# Patient Record
Sex: Male | Born: 1935 | Race: Black or African American | Hispanic: No | Marital: Married | Smoking: Former smoker
Health system: Southern US, Community
[De-identification: ages and names within clinical notes are randomized; demographics above are authoritative.]

## PROBLEM LIST (undated history)

## (undated) DIAGNOSIS — I255 Ischemic cardiomyopathy: Secondary | ICD-10-CM

## (undated) DIAGNOSIS — I480 Paroxysmal atrial fibrillation: Secondary | ICD-10-CM

## (undated) DIAGNOSIS — I2510 Atherosclerotic heart disease of native coronary artery without angina pectoris: Secondary | ICD-10-CM

## (undated) DIAGNOSIS — E119 Type 2 diabetes mellitus without complications: Secondary | ICD-10-CM

## (undated) DIAGNOSIS — I5020 Unspecified systolic (congestive) heart failure: Secondary | ICD-10-CM

## (undated) DIAGNOSIS — I10 Essential (primary) hypertension: Secondary | ICD-10-CM

## (undated) DIAGNOSIS — I442 Atrioventricular block, complete: Secondary | ICD-10-CM

## (undated) DIAGNOSIS — N183 Chronic kidney disease, stage 3 unspecified: Secondary | ICD-10-CM

## (undated) DIAGNOSIS — I453 Trifascicular block: Secondary | ICD-10-CM

## (undated) DIAGNOSIS — E785 Hyperlipidemia, unspecified: Secondary | ICD-10-CM

## (undated) DIAGNOSIS — I441 Atrioventricular block, second degree: Secondary | ICD-10-CM

## (undated) DIAGNOSIS — I509 Heart failure, unspecified: Secondary | ICD-10-CM

## (undated) HISTORY — DX: Unspecified systolic (congestive) heart failure: I50.20

## (undated) HISTORY — PX: CARDIAC CATHETERIZATION: SHX172

## (undated) HISTORY — DX: Atherosclerotic heart disease of native coronary artery without angina pectoris: I25.10

## (undated) HISTORY — DX: Atrioventricular block, second degree: I44.1

## (undated) HISTORY — PX: HERNIA REPAIR: SHX51

## (undated) HISTORY — DX: Trifascicular block: I45.3

## (undated) HISTORY — DX: Atrioventricular block, complete: I44.2

## (undated) HISTORY — DX: Hyperlipidemia, unspecified: E78.5

## (undated) HISTORY — DX: Ischemic cardiomyopathy: I25.5

## (undated) HISTORY — DX: Heart failure, unspecified: I50.9

## (undated) HISTORY — DX: Chronic kidney disease, stage 3 unspecified: N18.30

## (undated) HISTORY — DX: Paroxysmal atrial fibrillation: I48.0

## (undated) HISTORY — PX: BACK SURGERY: SHX140

---

## 1989-08-24 DIAGNOSIS — I10 Essential (primary) hypertension: Secondary | ICD-10-CM | POA: Insufficient documentation

## 2005-05-14 ENCOUNTER — Emergency Department: Payer: Self-pay | Admitting: Unknown Physician Specialty

## 2005-10-05 DIAGNOSIS — Z8546 Personal history of malignant neoplasm of prostate: Secondary | ICD-10-CM

## 2005-10-05 DIAGNOSIS — C61 Malignant neoplasm of prostate: Secondary | ICD-10-CM | POA: Insufficient documentation

## 2005-10-05 HISTORY — DX: Malignant neoplasm of prostate: C61

## 2006-03-09 ENCOUNTER — Emergency Department: Payer: Self-pay | Admitting: Emergency Medicine

## 2006-05-02 ENCOUNTER — Emergency Department: Payer: Self-pay | Admitting: Emergency Medicine

## 2006-05-02 ENCOUNTER — Other Ambulatory Visit: Payer: Self-pay

## 2007-06-30 ENCOUNTER — Inpatient Hospital Stay: Payer: Self-pay | Admitting: Internal Medicine

## 2007-06-30 ENCOUNTER — Other Ambulatory Visit: Payer: Self-pay

## 2007-07-01 ENCOUNTER — Other Ambulatory Visit: Payer: Self-pay

## 2007-07-25 ENCOUNTER — Ambulatory Visit: Payer: Self-pay | Admitting: Vascular Surgery

## 2007-07-26 ENCOUNTER — Ambulatory Visit: Payer: Self-pay | Admitting: Vascular Surgery

## 2008-05-12 ENCOUNTER — Emergency Department: Payer: Self-pay | Admitting: Emergency Medicine

## 2008-10-08 DIAGNOSIS — Z9861 Coronary angioplasty status: Secondary | ICD-10-CM

## 2008-10-08 HISTORY — DX: Coronary angioplasty status: Z98.61

## 2008-11-01 ENCOUNTER — Inpatient Hospital Stay: Payer: Self-pay | Admitting: Internal Medicine

## 2008-12-03 DIAGNOSIS — I209 Angina pectoris, unspecified: Secondary | ICD-10-CM | POA: Insufficient documentation

## 2008-12-03 DIAGNOSIS — I200 Unstable angina: Secondary | ICD-10-CM | POA: Insufficient documentation

## 2009-02-04 ENCOUNTER — Encounter: Payer: Self-pay | Admitting: Internal Medicine

## 2009-02-06 ENCOUNTER — Encounter: Payer: Self-pay | Admitting: Internal Medicine

## 2009-03-08 ENCOUNTER — Encounter: Payer: Self-pay | Admitting: Internal Medicine

## 2009-04-08 ENCOUNTER — Encounter: Payer: Self-pay | Admitting: Internal Medicine

## 2010-05-09 ENCOUNTER — Inpatient Hospital Stay: Payer: Self-pay | Admitting: Internal Medicine

## 2010-08-20 ENCOUNTER — Inpatient Hospital Stay: Payer: Self-pay | Admitting: Internal Medicine

## 2010-09-09 ENCOUNTER — Ambulatory Visit: Payer: Self-pay | Admitting: Cardiology

## 2011-05-25 DIAGNOSIS — K5730 Diverticulosis of large intestine without perforation or abscess without bleeding: Secondary | ICD-10-CM | POA: Insufficient documentation

## 2012-02-23 ENCOUNTER — Ambulatory Visit: Payer: Self-pay | Admitting: Anesthesiology

## 2012-05-23 ENCOUNTER — Ambulatory Visit: Payer: Self-pay | Admitting: Ophthalmology

## 2012-06-21 ENCOUNTER — Inpatient Hospital Stay: Payer: Self-pay | Admitting: Internal Medicine

## 2012-06-21 LAB — CBC
MCH: 29.2 pg (ref 26.0–34.0)
MCHC: 34.1 g/dL (ref 32.0–36.0)
Platelet: 174 10*3/uL (ref 150–440)
RDW: 14.5 % (ref 11.5–14.5)

## 2012-06-21 LAB — COMPREHENSIVE METABOLIC PANEL
Albumin: 3.1 g/dL — ABNORMAL LOW (ref 3.4–5.0)
Alkaline Phosphatase: 104 U/L (ref 50–136)
BUN: 10 mg/dL (ref 7–18)
Bilirubin,Total: 0.5 mg/dL (ref 0.2–1.0)
Calcium, Total: 7.2 mg/dL — ABNORMAL LOW (ref 8.5–10.1)
EGFR (Non-African Amer.): >60
Glucose: 287 mg/dL — ABNORMAL HIGH (ref 65–99)
Potassium: 2.8 mmol/L — ABNORMAL LOW (ref 3.5–5.1)
SGOT(AST): 18 U/L (ref 15–37)
Sodium: 141 mmol/L (ref 136–145)
Total Protein: 6.9 g/dL (ref 6.4–8.2)

## 2012-06-21 LAB — TROPONIN I: Troponin-I: 0.11 ng/mL — ABNORMAL HIGH

## 2012-06-22 LAB — CK TOTAL AND CKMB (NOT AT ARMC)
CK, Total: 220 U/L (ref 35–232)
CK, Total: 226 U/L (ref 35–232)
CK-MB: 0.8 ng/mL (ref 0.5–3.6)

## 2012-06-22 LAB — BASIC METABOLIC PANEL
BUN: 9 mg/dL (ref 7–18)
Calcium, Total: 7.2 mg/dL — ABNORMAL LOW (ref 8.5–10.1)
Creatinine: 0.92 mg/dL (ref 0.60–1.30)
EGFR (African American): >60
EGFR (Non-African Amer.): >60
Glucose: 196 mg/dL — ABNORMAL HIGH (ref 65–99)
Sodium: 143 mmol/L (ref 136–145)

## 2012-06-22 LAB — CBC WITH DIFFERENTIAL/PLATELET
Basophil #: 0 10*3/uL (ref 0.0–0.1)
Eosinophil %: 7.5 %
HCT: 35.7 % — ABNORMAL LOW (ref 40.0–52.0)
Lymphocyte #: 1.5 10*3/uL (ref 1.0–3.6)
MCH: 28.8 pg (ref 26.0–34.0)
MCV: 85 fL (ref 80–100)
Monocyte #: 0.6 x10 3/mm (ref 0.2–1.0)
Neutrophil %: 53.1 %
Platelet: 175 10*3/uL (ref 150–440)
RDW: 14.1 % (ref 11.5–14.5)

## 2012-06-22 LAB — HEMOGLOBIN A1C: Hemoglobin A1C: 10.6 % — ABNORMAL HIGH (ref 4.2–6.3)

## 2012-06-22 LAB — LIPID PANEL
Cholesterol: 113 mg/dL (ref 0–200)
HDL Cholesterol: 38 mg/dL — ABNORMAL LOW (ref 40–60)
Triglycerides: 71 mg/dL (ref 0–200)

## 2012-06-22 LAB — TROPONIN I
Troponin-I: 0.11 ng/mL — ABNORMAL HIGH
Troponin-I: 0.12 ng/mL — ABNORMAL HIGH

## 2012-06-23 LAB — BASIC METABOLIC PANEL
BUN: 9 mg/dL (ref 7–18)
Calcium, Total: 7.2 mg/dL — ABNORMAL LOW (ref 8.5–10.1)
Chloride: 106 mmol/L (ref 98–107)
EGFR (African American): >60
Osmolality: 289 (ref 275–301)
Potassium: 2.8 mmol/L — ABNORMAL LOW (ref 3.5–5.1)
Sodium: 144 mmol/L (ref 136–145)

## 2012-06-24 LAB — BASIC METABOLIC PANEL
Calcium, Total: 7.3 mg/dL — ABNORMAL LOW (ref 8.5–10.1)
Creatinine: 0.87 mg/dL (ref 0.60–1.30)
EGFR (African American): >60
EGFR (Non-African Amer.): >60
Sodium: 141 mmol/L (ref 136–145)

## 2012-06-27 LAB — CULTURE, BLOOD (SINGLE)

## 2012-08-09 ENCOUNTER — Emergency Department: Payer: Self-pay | Admitting: Emergency Medicine

## 2012-08-10 LAB — CBC WITH DIFFERENTIAL/PLATELET
Basophil %: 1.2 %
Eosinophil #: 0.4 10*3/uL (ref 0.0–0.7)
Eosinophil %: 7.1 %
HCT: 38.6 % — ABNORMAL LOW (ref 40.0–52.0)
HGB: 13.4 g/dL (ref 13.0–18.0)
Lymphocyte #: 1.5 10*3/uL (ref 1.0–3.6)
Lymphocyte %: 27.7 %
MCHC: 34.6 g/dL (ref 32.0–36.0)
Monocyte #: 0.4 x10 3/mm (ref 0.2–1.0)
Monocyte %: 7.1 %
Neutrophil #: 3.1 10*3/uL (ref 1.4–6.5)
Neutrophil %: 56.9 %
Platelet: 182 10*3/uL (ref 150–440)
RBC: 4.49 10*6/uL (ref 4.40–5.90)

## 2012-08-10 LAB — BASIC METABOLIC PANEL
Anion Gap: 10 (ref 7–16)
BUN: 15 mg/dL (ref 7–18)
Calcium, Total: 8.4 mg/dL — ABNORMAL LOW (ref 8.5–10.1)
Chloride: 107 mmol/L (ref 98–107)
Creatinine: 0.99 mg/dL (ref 0.60–1.30)
EGFR (African American): >60
EGFR (Non-African Amer.): >60
Glucose: 187 mg/dL — ABNORMAL HIGH (ref 65–99)
Osmolality: 294 (ref 275–301)
Potassium: 3.1 mmol/L — ABNORMAL LOW (ref 3.5–5.1)

## 2012-08-10 LAB — SEDIMENTATION RATE: Erythrocyte Sed Rate: 11 mm/hr (ref 0–20)

## 2012-09-05 ENCOUNTER — Ambulatory Visit: Payer: Self-pay

## 2013-05-21 ENCOUNTER — Inpatient Hospital Stay: Payer: Self-pay | Admitting: Internal Medicine

## 2013-05-21 LAB — TROPONIN I: Troponin-I: 0.12 ng/mL — ABNORMAL HIGH

## 2013-05-21 LAB — CK TOTAL AND CKMB (NOT AT ARMC)
CK, Total: 110 U/L (ref 35–232)
CK-MB: 0.7 ng/mL (ref 0.5–3.6)

## 2013-05-21 LAB — CBC
HGB: 13.2 g/dL (ref 13.0–18.0)
MCH: 28.5 pg (ref 26.0–34.0)
RBC: 4.62 10*6/uL (ref 4.40–5.90)
RDW: 14.5 % (ref 11.5–14.5)
WBC: 5.2 10*3/uL (ref 3.8–10.6)

## 2013-05-21 LAB — BASIC METABOLIC PANEL
Anion Gap: 6 — ABNORMAL LOW (ref 7–16)
BUN: 16 mg/dL (ref 7–18)
Calcium, Total: 7.3 mg/dL — ABNORMAL LOW (ref 8.5–10.1)
Chloride: 109 mmol/L — ABNORMAL HIGH (ref 98–107)
Co2: 28 mmol/L (ref 21–32)
Creatinine: 1.16 mg/dL (ref 0.60–1.30)
EGFR (African American): >60
EGFR (Non-African Amer.): >60
Osmolality: 290 (ref 275–301)
Sodium: 143 mmol/L (ref 136–145)

## 2013-05-21 LAB — HEMOGLOBIN A1C: Hemoglobin A1C: 8.5 % — ABNORMAL HIGH (ref 4.2–6.3)

## 2013-05-21 LAB — MAGNESIUM: Magnesium: 0.8 mg/dL — ABNORMAL LOW

## 2013-05-21 LAB — PROTIME-INR: INR: 0.9

## 2013-05-22 LAB — LIPID PANEL
HDL Cholesterol: 50 mg/dL (ref 40–60)
Ldl Cholesterol, Calc: 69 mg/dL (ref 0–100)
Triglycerides: 68 mg/dL (ref 0–200)
VLDL Cholesterol, Calc: 14 mg/dL (ref 5–40)

## 2013-05-22 LAB — MAGNESIUM: Magnesium: 2.1 mg/dL

## 2013-05-22 LAB — TROPONIN I
Troponin-I: 0.12 ng/mL — ABNORMAL HIGH
Troponin-I: 0.1 ng/mL — ABNORMAL HIGH

## 2013-05-22 LAB — CK TOTAL AND CKMB (NOT AT ARMC)
CK, Total: 86 U/L (ref 35–232)
CK-MB: 0.8 ng/mL (ref 0.5–3.6)
CK-MB: 0.8 ng/mL (ref 0.5–3.6)

## 2013-06-05 DIAGNOSIS — R001 Bradycardia, unspecified: Secondary | ICD-10-CM | POA: Diagnosis present

## 2014-01-06 ENCOUNTER — Ambulatory Visit (HOSPITAL_COMMUNITY)
Admission: AD | Admit: 2014-01-06 | Discharge: 2014-01-06 | Disposition: A | Discharge status: Home / Self Care | Payer: Self-pay | Source: Other Acute Inpatient Hospital | Attending: Emergency Medicine | Admitting: Emergency Medicine

## 2014-01-06 ENCOUNTER — Emergency Department: Payer: Self-pay | Admitting: Emergency Medicine

## 2014-01-06 DIAGNOSIS — R079 Chest pain, unspecified: Secondary | ICD-10-CM | POA: Insufficient documentation

## 2014-01-06 LAB — TROPONIN I
Troponin-I: 0.17 ng/mL — ABNORMAL HIGH
Troponin-I: 0.14 ng/mL — ABNORMAL HIGH

## 2014-01-06 LAB — COMPREHENSIVE METABOLIC PANEL
ALBUMIN: 3.5 g/dL (ref 3.4–5.0)
ALT: 23 U/L (ref 12–78)
Alkaline Phosphatase: 87 U/L
Anion Gap: 4 — ABNORMAL LOW (ref 7–16)
BILIRUBIN TOTAL: 0.3 mg/dL (ref 0.2–1.0)
BUN: 21 mg/dL — ABNORMAL HIGH (ref 7–18)
CALCIUM: 7.7 mg/dL — AB (ref 8.5–10.1)
CO2: 32 mmol/L (ref 21–32)
Chloride: 106 mmol/L (ref 98–107)
Creatinine: 1.02 mg/dL (ref 0.60–1.30)
EGFR (Non-African Amer.): >60
Glucose: 120 mg/dL — ABNORMAL HIGH (ref 65–99)
Osmolality: 287 (ref 275–301)
POTASSIUM: 3 mmol/L — AB (ref 3.5–5.1)
SGOT(AST): 24 U/L (ref 15–37)
SODIUM: 142 mmol/L (ref 136–145)
Total Protein: 6.9 g/dL (ref 6.4–8.2)

## 2014-01-06 LAB — MAGNESIUM: Magnesium: 0.9 mg/dL — ABNORMAL LOW

## 2014-01-06 LAB — CBC
HCT: 39.8 % — ABNORMAL LOW (ref 40.0–52.0)
HGB: 12.6 g/dL — ABNORMAL LOW (ref 13.0–18.0)
MCH: 27.1 pg (ref 26.0–34.0)
MCHC: 31.7 g/dL — ABNORMAL LOW (ref 32.0–36.0)
MCV: 86 fL (ref 80–100)
Platelet: 198 10*3/uL (ref 150–440)
RBC: 4.66 10*6/uL (ref 4.40–5.90)
RDW: 14.9 % — ABNORMAL HIGH (ref 11.5–14.5)
WBC: 5.9 10*3/uL (ref 3.8–10.6)

## 2014-01-06 LAB — CK TOTAL AND CKMB (NOT AT ARMC)
CK, TOTAL: 145 U/L
CK-MB: 1.5 ng/mL (ref 0.5–3.6)

## 2014-01-06 LAB — APTT: Activated PTT: 27 secs (ref 23.6–35.9)

## 2014-01-06 LAB — PROTIME-INR
INR: 1
Prothrombin Time: 12.6 secs (ref 11.5–14.7)

## 2014-01-09 MED FILL — Aspirin Chew Tab 81 MG: ORAL | Qty: 3 | Status: AC

## 2014-01-24 ENCOUNTER — Encounter: Payer: Self-pay | Admitting: Cardiovascular Disease

## 2014-02-06 ENCOUNTER — Encounter: Payer: Self-pay | Admitting: Cardiovascular Disease

## 2014-03-08 ENCOUNTER — Encounter: Payer: Self-pay | Admitting: Cardiovascular Disease

## 2014-04-08 ENCOUNTER — Encounter: Payer: Self-pay | Admitting: Cardiovascular Disease

## 2015-02-25 NOTE — H&P (Signed)
PATIENT NAME:  [name], [name] MR#:  [id number] DATE OF BIRTH:  February 03, 1935  DATE OF ADMISSION:  06/21/2012  REFERRING PHYSICIAN: Dr. Robet Leu    CHIEF COMPLAINT: Chest pain.   PRIMARY CARE PHYSICIAN: At [place]: At [place]: The patient is a pleasant 80 year old African American male with history of coronary artery disease status post stenting, history of GI bleed secondary to PUD, diabetes, and hypertension who presents with above chief complaint. The patient stated that he started to have wheezing and shortness of breath two days ago with a productive cough with yellowish sputum. He has no history of wheezing in the past. Then last night he started to have chest pain in the left side of the chest without radiation or visual changes. The patient has some shortness of breath with the chest pain. The pain is exacerbated with coughing. There is no nausea, vomiting, or abdominal pain. The pain in the chest is described as pressure coming and going. Here he was found to have elevated troponin of 0.1. The patient has no fever, no leukocytosis. Hospitalist service was contacted for further evaluation and management.   PAST MEDICAL HISTORY:  1. Coronary artery disease, status post stenting x4. 2. History of GI bleed secondary to PUD. 3. Hypertension. 4. Diabetes.  5. Hyperlipidemia.  6. Prostate cancer status post external beam radiation. 7. History of back surgeries. 8. History of hernia repair. 9. History of bilateral eyelid surgery for eyelid droopiness.   MEDICATIONS:  1. Amlodipine 10 mg daily.  2. Aspirin 81 mg daily.  3. Lipitor 80 mg daily.  4. Desonide 0.05% topical cream apply to sutures two times a day.  5. Lantus 36 units daily.  6. Losartan 50 mg 2 times a day.  7. Metformin 1000 mg 2 times a day.  8. Metoprolol tartrate 25 mg 2 times a day.  9. Pantoprazole 40 mg 2 times a day.  10. Zetia 10 mg daily.   ALLERGIES: No  known drug allergies.   SOCIAL HISTORY: No tobacco, alcohol, or drug use currently. Stopped tobacco abuse in 1987 per patient.   FAMILY HISTORY: Positive for coronary artery disease with dad including MI. Positive for diabetes.  REVIEW OF SYSTEMS: CONSTITUTIONAL: No fever. No weakness. No weight changes. EYES: Positive for history of droopy eyelids. No visual changes. ENT: No tinnitus or hearing loss. No snoring. No sore throat. RESPIRATORY: Positive for cough with yellowish sputum. Positive for wheezing for two days. Positive shortness of breath. No history of asthma or COPD. CARDIOVASCULAR: Chest pain as above. No orthopnea or arrhythmia. No dyspnea on exertion. No palpitations or syncope. GI: No nausea, vomiting, or diarrhea. Positive for hernia repair. GU: No dysuria, hematuria, or frequency. ENDOCRINE: No polyuria or nocturia. HEME/LYMPH: No anemia or easy bruising. SKIN: No new rashes. MUSCULOSKELETAL: Positive arthritis in knees. NEUROLOGIC: No numbness or weakness. PSYCH: No anxiety or depression.   PHYSICAL EXAMINATION:   VITAL SIGNS: Temperature on arrival 99.1, pulse 57, respiratory rate 24, blood pressure initially 193/91, while I was in the room systolic was 258, oxygen sats 97% on room air.   GENERAL: The patient is an obese African American male sitting in bed in no obvious distress talking in full sentences.   HEENT: Normocephalic, atraumatic. Pupils are equal and reactive. Extraocular muscles intact. Anicteric sclerae. Moist mucous membranes.   NECK: Supple. No thyroid tenderness. No cervical lymphadenopathy.   CARDIOVASCULAR: S1, S2 bradycardic. No murmurs, rubs, or  gallops.   LUNGS: Bilateral wheezing more on the left base with some rales. Good air entry otherwise. Normal effort.   ABDOMEN: Soft, nontender, nondistended. Positive bowel sounds in all quadrants. No organomegaly could be appreciated.   EXTREMITIES: No significant lower extremity edema.   SKIN: No obvious  rashes.   NEUROLOGIC: Cranial nerves II through XII grossly intact. Strength 5 out of 5 in all extremities.   PSYCH: Awake, alert, oriented x3. Cooperative. Conversant.   LABORATORY, DIAGNOSTIC, AND RADIOLOGICAL DATA: BNP 528. Glucose 287, creatinine 0.97, sodium 141, potassium 2.8, albumin 3.1, otherwise LFTs within normal limits. Troponin 0.11. CK-MB 0.9. CK total 202. WBC 6, hematocrit 38.1, platelets 174.   EKG sinus bradycardia with rate of 55. No acute ST elevations or depressions. There are some nonspecific T wave abnormalities.   X-ray of the chest performed, not officially read, but there is obscurity of heart border on the left base.   ASSESSMENT AND PLAN: We have a 80 year old African American male with history of CAD status post stenting, diabetes, hypertension, and hyperlipidemia who presents with chest pain. The patient also has been having a productive cough with wheezing and pain in the left side of the chest appears to be reproducible with cough. The official x-ray of the chest is not back yet. To me there appears to be some possible infiltrate at the left base and the patient has rales on exam. It is possible that the patient has been having a pneumonia but also the patient does have elevation of troponin. At this point with history of CAD and stenting in the past we would admit the patient to the hospital.   For the pneumonia, we will start the patient on Levaquin IV and repeat x-ray of the chest in the morning. The patient also is wheezy on the exam with rales. Would start him on nebs around-the-clock with some cough suppressants. We would trend the troponins and get an echocardiogram and obtain a Cardiology consult for tomorrow. Resume his aspirin, statin, and beta-blocker and start the patient on nitro paste as well. Last cath was done in 2011 showing moderate CAD with patent stents. Would also start him on some morphine p.r.n. and oxygen at this point and resume his beta-blocker  and ARB.   In regards to his diabetes, we would start sliding scale insulin and continue his Lantus.   In regards to his high blood pressure, would continue his outpatient medications.   The patient does have hypokalemia which is moderate and would replace this with IV replacement.   Would start him on DVT prophylaxis and resume his PPI b.i.d.   TOTAL TIME SPENT: 55 minutes.   CODE STATUS: The patient is FULL CODE.   ____________________________ Vivien Presto, MD sa:drc D: 06/21/2012 18:55:01 ET T: 06/22/2012 05:40:09 ET JOB#: [id number]  cc: Vivien Presto, MD, <Dictator> Vivien Presto MD ELECTRONICALLY SIGNED 07/02/2012 16:12

## 2015-02-25 NOTE — Discharge Summary (Signed)
PATIENT NAME:  [name], ENGELBERT MR#:  [id number] DATE OF BIRTH:  [birth year]  DATE OF ADMISSION:  06/21/2012 DATE OF DISCHARGE:  06/24/2012  DISCHARGE DIAGNOSES:  1. Acute bronchitis. 2. Chest pain secondary to acute bronchitis. 3. Elevated troponins, because of demand ischemia from acute bronchitis.  4. Hypertension.  5. Coronary artery disease with history of stents.  6. Hyperlipidemia.  7. Diabetes mellitus type 2.   DISCHARGE MEDICATIONS:  1. Aspirin 81 mg daily. 2. Amlodipine 10 mg daily. 3. Desonide apply topically to sutures two times a day as shown.  4. Atorvastatin 80 mg daily.  5. Lantus 36 units once daily.  6. Losartan 50 mg p.o. b.i.d.  7. Pantoprazole 40 mg p.o. b.i.d.  8. Zetia 10 mg daily. 9. Metformin 1 gram p.o. b.i.d.  10. Metoprolol 25 mg he was taking. That is decreased to 12.5 mg daily because of episodes of bradycardia. 11. Levaquin 500 milligrams every 24 hours for five days.  12. Furosemide 20 mg daily.  13. KCl 20 mEq p.o. daily with Lasix. 14. Combivent inhalers every six hours as needed for wheezing.   DIET: Low sodium, low fat, ADA diet.   FOLLOW-UP: Dr. Nehemiah Massed in 7 to 10 days.   CONSULTATION: Cardiology consult with Dr. Nehemiah Massed.  LABORATORY, RADIOLOGICAL AND DIAGNOSTIC DATA: EKG on admission: Sinus bradycardia at 55 beats per minute. Troponin 0.11, the first one. The second one is 0.11. The patient's blood cultures did not show any growth. Chest x-ray on admission showed no evidence of cardiopulmonary disease. BNP 528. CK total 4,202. CPK-MB 0.9. Electrolytes on admission: Sodium 141, potassium 2.8, chloride 104, bicarbonate 28, BUN 10, creatinine 0.97, glucose 287. The patient's WBC on admission 6, hemoglobin 13, hematocrit 38.1, platelets 134. LDL 61. Hemoglobin A1c 10.6. Potassium improved to 3.2 on 08/15 and repeat potassium on 08/16 was 2.8, which was replaced. The patient had a stress test done which showed intermittent Lexiscan EKG and ejection  fraction more than 51%. Normal myocardial perfusion.   HOSPITAL COURSE:  3. 80 year old male with history of hypertension, coronary artery disease and stents who follows up with primary doctor in [facility], who came in because of wheezing, cough and phlegm. The patient had some fever at home. He was admitted for probable acute bronchitis and elevated troponins. The patient was started on IV Levaquin along with nebulizers. The patient's symptoms improved. Cough improved and he is not hypoxic. Lungs are much better and he will be discharged with a dose of Levaquin and also Combivent.   2. Possible congestive heart failure. Ejection fraction more than 55%. He was initially started on IV fluids, started on Lasix and the patient was seen by Dr. Nehemiah Massed as well. He had a Lexiscan stress test which showed ejection fraction more than 55% and also normal perfusion scan, so troponin elevation thought to be secondary to myocardial demand secondary to acute bronchitis. Dr. Nehemiah Massed recommended no further cardiac intervention. The patient continued on his aspirin, amlodipine, metoprolol, Zetia and losartan. The patient was off Plavix because of side effects, so he is not taking Plavix anymore. The patient's heart rate is around 50s to 60s, so I decreased the metoprolol and he is getting Metoprolol 12.5 daily and he was on 25 mg b.i.d. so I decreased the dose of metoprolol due to bradycardia. The patient is on Lasix and he has hypokalemia which is being replaced and he was also gave a prescription for potassium chloride for his hypokalemia.  3. Diabetes mellitus type 2 which  is uncontrolled with a hemoglobin A1c of 10.6. He was taking metformin and also Lantus. We have continued that the patient's blood sugar this morning was 96, so the patient can follow up with his primary doctor.  4. Hyperlipidemia. LDL goal of 61, so he is on his home dose of statins.         TIME SPENT ON DISCHARGE PREPARATION: More than 30  minutes.    ____________________________ Epifanio Lesches, MD sk:ap D: 06/24/2012 08:26:32 ET T: 06/26/2012 09:49:36 ET JOB#: [id number]  cc: Epifanio Lesches, MD, <Dictator> Corey Skains, MD Epifanio Lesches MD ELECTRONICALLY SIGNED 07/12/2012 10:19

## 2015-02-25 NOTE — Consult Note (Signed)
PATIENT NAME:  [name], [name] MR#:  [id number] DATE OF BIRTH:  01/16/1935  DATE OF CONSULTATION:  06/22/2012  REFERRING PHYSICIAN:  Dr. Bridgette Habermann  CONSULTING PHYSICIAN:  Corey Skains, MD  PRIMARY CARE PHYSICIAN: [facility]  REASON FOR CONSULTATION: Known coronary artery disease, diabetes mellitus, hypertension, hyperlipidemia, old myocardial infarction with chest pain.    CHIEF COMPLAINT: Chest pain.    HISTORY OF PRESENT ILLNESS: This is a 80 year old male with known coronary artery disease status post previous multiple myocardial infarctions who has had LV systolic dysfunction. In addition to that, the patient has had multiple stents in multiple arteries  in 2011. At that time he has done fairly well on multiple medications. He does have hypertension for which is on appropriate medications including ACE inhibitor as well as calcium channel blocker. The patient has diabetes mellitus with reasonable hemoglobin A1c at this time and no concerns. He also has hyperlipidemia, stable on statin. Yesterday he had new onset substernal chest discomfort, weakness and shortness of breath with some wheezing but no evidence of fever. The patient did have some mild cough. The chest pain was substernal, radiating into the back. This lasted for all the day and had now subsided somewhat. The patient has had no further chest discomfort. EKG has shown normal sinus rhythm with nonspecific ST changes. In addition, the patient has a troponin of 0.1.   REVIEW OF SYSTEMS: Remainder of review of systems negative for vision change, ringing in the ears, hearing loss, cough, congestion, heartburn, nausea, vomiting, diarrhea, bloody stools, stomach pain, extremity pain, leg weakness, cramping of the buttocks, known blood clots, headaches, blackouts, dizzy spells, nosebleeds, congestion, trouble swallowing, frequent urination, urination at night, muscle weakness, numbness, anxiety, depression, skin lesions, skin rashes.   PAST MEDICAL  HISTORY:  1. Known coronary disease status post previous myocardial infarction, PCI and stent placement.  2. Hypertension.  3. Hyperlipidemia.  4. Diabetes mellitus.  5. Valvular heart disease.   FAMILY HISTORY: Father died of myocardial infarction at an early age.   SOCIAL HISTORY: He has remote tobacco use. Currently denies alcohol use.   ALLERGIES: He has no known drug allergies.   CURRENT MEDICATIONS: As listed.   PHYSICAL EXAMINATION:  VITAL SIGNS: Blood pressure 136/68 bilaterally, heart rate 72 upright, reclining, and regular.   GENERAL: He is a well appearing male in no acute distress.   HEENT: No icterus, thyromegaly, ulcers, hemorrhage, or xanthelasma.   CARDIOVASCULAR: Regular rate and rhythm. Normal S1, S2 with a 2/6 apical murmur consistent with mitral regurgitation. Point of maximal impulse is diffuse. Carotid upstroke normal without bruit. Jugular venous pressure normal.   LUNGS: Lungs have few basilar crackles with expiratory wheezes.   ABDOMEN: Soft, nontender without hepatosplenomegaly or masses. Abdominal aorta is normal size without bruit.   EXTREMITIES: 2+ bilateral pulses in dorsal, pedal, radial, and femoral arteries without lower extremity edema, cyanosis, clubbing, ulcers.   NEUROLOGIC: He is oriented to time, place, and person with normal mood and affect.   ASSESSMENT: 80 year old male with hypertension, hyperlipidemia, coronary artery disease, old myocardial infarction, previous stent placement with unstable angina with slightly abnormal EKG and wheezing with troponin of 0.1 consistent with demand ischemia at this point rather than acute myocardial infarction, although will further investigate.   RECOMMENDATIONS:  1. Continue hypertension control with calcium channel blocker, ACE inhibitor for further risk reduction in cardiovascular event with systolic blood pressure below 140 mm.  2. Continue diabetes treatment with a goal hemoglobin A1c below 7  without  change today.  3. Further investigation of wheezing and possible chronic obstructive pulmonary disease exacerbation.  4. Echocardiogram for LV dysfunction, congestive heart failure causing wheezing and/or shortness of breath.  5. LexiScan infusion Myoview to assess for myocardial ischemia and further intervention as necessary including possible cardiac catheterization if significant changes with ischemia  6. Continue hyperlipidemia control with statin with a goal LDL below 70 mg/dL.  7. Further diagnostic testing and treatment options after above.   ____________________________ Corey Skains, MD bjk:cms D: 06/22/2012 08:54:15 ET T: 06/22/2012 09:54:38 ET JOB#: [id number]  cc: Corey Skains, MD, <Dictator> Corey Skains MD ELECTRONICALLY SIGNED 06/24/2012 9:32

## 2015-02-28 NOTE — Discharge Summary (Signed)
PATIENT NAME:  [name], [name] MR#:  [id number] DATE OF BIRTH:  12-Dec-1934  DATE OF ADMISSION:  05/21/2013 DATE OF DISCHARGE:  05/23/2013  PRESENTING COMPLAINT: Chest pain.   DISCHARGE DIAGNOSES:  1.  Unstable angina/acute non-Q-wave myocardial infarction status post catheter without any intervention. Medical management recommended.  2.  Hypertension.  3.  Coronary artery disease.  4.  Bradycardia due to beta blockers.  5.  Type 2 diabetes.  6.  Severe hypomagnesemia, repleted.   PROCEDURES: Cardiac catheter showed patent stent in LAD and left circumflex. New stenosis of diagonal and distal anterior artery. Medical management for now.   Saturations 97% on room air.   CODE STATUS: Full code.   The patient is recommended to stop taking metoprolol.   HOME MEDICATIONS ON DISCHARGE: 1.  Aspirin 81 mg daily.  2.  Amlodipine 10 mg daily.  3.  Zetia 10 mg daily.  4.  Furosemide 20 mg daily.  5.  Oxybutynin 5 mg one tablet twice a day.  6.  Pantoprazole 40 mg b.i.d.  7.  Lovastatin 80 mg daily.  8.  Losartan 100 mg daily.  9.  Acyclovir 800 mg daily.  10.  Lantus 40 units in the morning.  11.  Isosorbide mononitrate 30 units extended release daily.  12.  Metformin 1000 mg b.i.d.   DISCHARGE DIET: Low-sodium, low-fat, low-cholesterol 8800 calorie diet.   FOLLOWUP:  1.  Dr. Nehemiah Massed in 1 to 2 weeks.  2.  Primary care physician,  Duane Quail, MD.   Cardiology consultation with Dr. Nehemiah Massed.   DIAGNOSTIC STUDIES:  1.  Troponin was 0.1, 0.12, and 0.12. Magnesium was 0.8.  2.  Creatinine is 1.1. Sodium is 143. Potassium is 3.0.  3.  Hemoglobin and hematocrit is 13.2 and 39.5. Platelet count 136.  4.  EKG showed marked sinus bradycardia and left axis deviation. T-wave abnormality in inferolateral leads.  5.  CK-MB and CPK within normal limits.   BRIEF SUMMARY OF HOSPITAL COURSE: The patient is a 80 year old Caucasian gentleman with history of coronary artery disease, status post  stent in the past, comes in with: 1.  Unstable angina/acute non-Q-wave MI. The patient was placed on telemetry floor. He was continued on aspirin, statins, lisinopril, and amlodipine. His beta blockers were discontinued because of severe significant bradycardia. Troponin was 0.12, 0.12 and 0.8 and 0.10. The patient was seen by Dr. Nehemiah Massed. Cath was performed. Results as noted above were noted. Medical management was recommended. P.o. Imdur was added.  2.  Hypokalemia, repleted.  3.  Severe hypomagnesemia. The patient received IV and p.o. magnesium.  4.  Type 2 diabetes. A1c is 8.1.  Insulin was continued. Metformin will be resumed after 48 hours from discharge from home secondary to cardiac catheter.  5.  Hyperlipidemia: Continue statins and Zetia. The patient will follow up with Dr. Nehemiah Massed in 1 to 2 weeks. Hospital stay otherwise remained stable.  6.  The patient remained a full code.   TIME SPENT: Was 40 minutes.    ____________________________ Hart Rochester Posey Pronto, MD sap:np D: 05/28/2013 14:35:03 ET T: 05/28/2013 19:54:25 ET JOB#: [id number]  cc: Caleel Kiner A. Posey Pronto, MD, <Dictator> Duane Quail, MD Corey Skains, MD    Ilda Basset MD ELECTRONICALLY SIGNED 06/04/2013 11:24

## 2015-02-28 NOTE — Consult Note (Signed)
PATIENT NAME:  [name], [name] MR#:  [id number] DATE OF BIRTH:  1935/01/16  DATE OF CONSULTATION:  05/21/2013  REFERRING PHYSICIAN:  Dr. Manuella Ghazi CONSULTING PHYSICIAN:  Corey Skains, MD  REASON FOR CONSULTATION: Coronary artery disease, previous myocardial infarction, abnormal EKG and bradycardia with chest pain consistent with unstable angina.   CHIEF COMPLAINT: "I have chest pain."  HISTORY OF PRESENT ILLNESS: This is a 80 year old male with known coronary artery disease, status post multiple previous myocardial infarctions, PCI and stent placements of multiple arteries, who has had new onset of substernal chest discomfort radiating into the left upper chest and occasionally into the left arm associated with shortness of breath over the last several days culminating in a need for Emergency Room visit, relieved by nitroglycerin. There is some waxing and waning of chest pain since admission, but much improved. The patient has had an EKG showing sinus bradycardia with abnormal T wave inversion in the anterior precordial leads consistent with possible myocardial infarction with a troponin of 0.12, more consistent with demand ischemia at this stage, although will continue serial ECG and enzymes. The patient has had known diabetes, hypertension and hyperlipidemia, which have been treated with appropriate medications at this time and stable.   REVIEW OF SYSTEMS: The remainder review of systems negative for vision change, ringing in the ears, hearing loss, cough, congestion, heartburn, nausea, vomiting, diarrhea, bloody stools, stomach pain, extremity pain, leg weakness, cramping of the buttocks, known blood clots, headaches, blackouts, dizzy spells, nosebleeds, congestion, trouble swallowing, frequent urination, urination at night, muscle weakness, numbness, anxiety, depression, skin lesions, or skin rashes.   PAST MEDICAL HISTORY:  1.  Coronary artery disease.  2.  Hypertension.  3.  Hyperlipidemia.  4.   Diabetes.   FAMILY HISTORY: No family members with early onset of cardiovascular disease or hypertension.   SOCIAL HISTORY: Currently denies alcohol or tobacco use.   ALLERGIES: As listed.   MEDICATIONS: As listed.   PHYSICAL EXAMINATION:  VITAL SIGNS: Blood pressure 162/74, heart rate is a 52 upright and reclining.  GENERAL: He is a well appearing male in no acute distress.  HEENT: No icterus, thyromegaly, ulcers, hemorrhage or xanthelasma.  CARDIOVASCULAR: Regular rate and rhythm with normal S1 and S2. A 2 out of 6 apical murmur consistent with mitral regurgitation. PMI is diffuse. Carotid upstroke normal without bruit. Jugular venous pressure is normal.  LUNGS: Have bibasilar crackles with normal respirations.  ABDOMEN: Soft and nontender. Cannot assess hepatosplenomegaly or masses due to increased abdominal girth.  EXTREMITIES: Shows trace lower extremity edema. No cyanosis, clubbing, or ulcers with 2+ radial, femoral and dorsal pedal pulses.  NEUROLOGIC: He is oriented to time, place, and person, with normal mood and affect.   ASSESSMENT: This is a 80 year old male with hypertension, hyperlipidemia, diabetes and bradycardia with chest pain consistent with unstable angina with slight elevation of troponin, currently consistent with unstable angina or class IV [place] class angina, needing further treatment.   RECOMMENDATIONS:  1.  Continue serial ECG and enzymes to assess for possible myocardial infarction versus continued demand ischemia.  2.  Proceed to cardiac catheterization to assess coronary anatomy and further treatment thereof as necessary. The patient understands the risks and benefits of cardiac catheterization. These include the possibility of death, stroke, heart attack, infection, bleeding or blood clot. He is at low risk for conscious sedation.  3.  Continue aspirin, Lovenox, nitroglycerin and beta blocker for heart rate and blood pressure control.  4.  Diabetes control  with goal  hemoglobin A1c below 7.  5.  Echocardiogram for LV dysfunction, valvular heart disease, abnormal EKG and chest pain.  6.  Further treatment options after above.   ____________________________ Corey Skains, MD bjk:aw D: 05/22/2013 08:09:12 ET T: 05/22/2013 08:31:10 ET JOB#: [id number]  cc: Corey Skains, MD, <Dictator> Corey Skains MD ELECTRONICALLY SIGNED 05/26/2013 10:33

## 2015-02-28 NOTE — H&P (Signed)
PATIENT NAME:  [name], [name] MR#:  [id number] DATE OF BIRTH:  [birth year]  DATE OF ADMISSION:  05/21/2013  PRIMARY CARE PHYSICIAN: [facility] CARDIOLOGIST:  At [facility]    REQUESTING PHYSICIAN: Conni Slipper, MD   CHIEF COMPLAINT: Chest pain.   HISTORY OF PRESENT ILLNESS: The patient is a 80 year old male with a known history of coronary artery disease, status post stenting x 4, GI bleed, hypertension, diabetes, is being admitted for unstable angina.   The patient started having chest pain yesterday, was undulating, radiating to left shoulder, about 6 out of 10 in severity at its worst. He also had some associated shortness of breath. Considering his history of cardiac disease, he decided to come to the Emergency Department. While in the ED, he was found to have troponin of 0.12 and still having some chest pain for which he is being admitted for further evaluation and management. He also had a severe hypomagnesemia with magnesium 0.8 and K was 3.0.   PAST MEDICAL HISTORY: 1.  Coronary artery disease status post stenting x 4.  2.  History of GI bleed secondary to peptic ulcer disease.  3.  Hypertension.  4.  Diabetes.  5.  Hyperlipidemia.  6.  History of prostate cancer, status post external beam radiation.   PAST SURGICAL HISTORY: 1.  Back surgery.  2.  Bilateral eyelid surgery for eyelid droopiness.   ALLERGIES: No known drug allergies.   SOCIAL HISTORY: No tobacco, alcohol or drug use.   FAMILY HISTORY: Positive for coronary artery disease in his father, also had a positive history of diabetes.   MEDICATIONS AT HOME: 1.  Acyclovir 800 mg once daily.  2.  Amlodipine 10 mg p.o. daily.  3.  Aspirin 81 mg p.o. daily.  4.  Lipitor 80 mg p.o. at bedtime.  5.  Lasix 20 mg p.o. daily.  6.  Insulin Lantus 40 units subcutaneous at bedtime.  7.  Losartan 100 mg p.o. daily.  8.  Metformin 1000 mg p.o. b.i.d.  9.  Metoprolol 25 mg p.o. b.i.d.  10.  Oxybutynin 5 mg p.o. b.i.d.  11.   Protonix 40 mg p.o. b.i.d.  12.  Zetia 10 mg p.o. daily.   REVIEW OF SYSTEMS:  CONSTITUTIONAL: No fever, fatigue, weakness.  EYES: He does have a history of droopy eyelids and some visual changes which are chronic. He does have difficulty with vision.  ENT: No tinnitus or ear pain.  RESPIRATORY: No cough, wheezing, hemoptysis.  CARDIOVASCULAR: Positive with chest pain, orthopnea, edema. Positive for shortness of breath.  GASTROINTESTINAL: Positive for nausea. No vomiting or diarrhea.  GENITOURINARY: No dysuria or hematuria.  ENDOCRINE: No polyuria or nocturia.  HEMATOLOGY: No anemia or easy bruising.  SKIN: No rash or lesion.  MUSCULOSKELETAL: No arthritis or muscle cramp.  NEUROLOGICAL: No tingling, numbness, weakness.  PSYCHIATRY: History of anxiety, depression.   PHYSICAL EXAMINATION: VITAL SIGNS: Temperature 98.4, heart rate 52 per minute, respirations 18 per minute, blood pressure 145/79 mmHg. He is saturating 96% on room air.  GENERAL:  The patient is a 80 year old male lying in the bed comfortably without any acute distress.  HEENT: Eyes: Pupils are equal, round, reactive to light and accommodation. No scleral icterus. Extraocular muscles intact. Head: Atraumatic and normocephalic.  Oropharynx and nasopharynx clear.  NECK: Supple. No jugular venous distention. No thyroid enlargement.  LUNGS:  Clear to auscultation bilaterally.  No wheezing, rales, rhonchi or crepitation.  CARDIOVASCULAR: S1, S2 normal. No murmurs, rubs, or gallop.  ABDOMEN:  Soft, nontender, nondistended. Bowel sounds present. No organomegaly or mass.  EXTREMITIES: No pedal edema, cyanosis, clubbing.  NEUROLOGIC: Nonfocal examination. Cranial nerves II through XII intact. Muscle strength 5 out of 5 in all extremities. Sensation is intact.  The patient is alert and oriented x 3.  SKIN: No obvious rash, lesion, ulcer.   LABORATORY AND RADIOLOGICAL DATA:  Normal BMP except potassium of 3.0, serum magnesium 0.8.   Troponin of 0.12.  Normal CBC except hematocrit of 39.5. PT of 12.7, INR 0.9 .  Chest x-ray showed no acute cardiopulmonary disease.  EKG shows sinus bradycardia, rate of 41 per minute, no major ST-T changes.   IMPRESSION AND PLAN: 1.  Unstable angina:  We will do serial cardiac enzymes. Consult cardiology, Dr. Nehemiah Massed.  I have discussed the case with him, who will see him in the morning and decide need of stress test versus cardiac cath. We will start him on aspirin, full dose nitroglycerin.  We will hold off beta blocker as he is severely bradycardic with a heart rate in the area of 40s to 50s.  We will start him on full dose Lovenox at this time.  2.  Hypokalemia: We will replete and recheck. 3.  Severe hypomagnesemia which can predispose him to severe cardiac arrhythmias:  We will replete and recheck.  4.  Diabetes mellitus: We will start him on insulin sliding scale, check hemoglobin A1c and hold his metformin at this time. We will continue her Lantus, put him on a diabetic diet.  5.  Borderline elevated troponin:  Cannot rule out MI at this time.  We will get serial cardiac enzymes, start him on full-dose aspirin and Lovenox, consult Cardiology.   CODE STATUS:  FULL CODE.     TOTAL TIME TAKING CARE OF THIS PATIENT (critical care): 55 minutes.    He remains at very high risk for serious cardiac arrhythmias considering significant serious  hypokalemia and hypomagnesemia.     ____________________________ Madalene Mickler S. Manuella Ghazi, MD vss:cb D: 05/21/2013 22:14:17 ET T: 05/21/2013 22:33:06 ET JOB#: [id number]  cc: Parul Porcelli S. Manuella Ghazi, MD, <Dictator> [place] MD ELECTRONICALLY SIGNED 05/24/2013 16:19

## 2015-05-22 DIAGNOSIS — M109 Gout, unspecified: Secondary | ICD-10-CM | POA: Insufficient documentation

## 2016-09-11 ENCOUNTER — Encounter: Payer: Self-pay | Admitting: Emergency Medicine

## 2016-09-11 ENCOUNTER — Emergency Department: Payer: No Typology Code available for payment source

## 2016-09-11 ENCOUNTER — Emergency Department
Admission: EM | Admit: 2016-09-11 | Discharge: 2016-09-12 | Disposition: A | Discharge status: Home / Self Care | Payer: No Typology Code available for payment source | Attending: Emergency Medicine | Admitting: Emergency Medicine

## 2016-09-11 DIAGNOSIS — S3992XA Unspecified injury of lower back, initial encounter: Secondary | ICD-10-CM | POA: Diagnosis present

## 2016-09-11 DIAGNOSIS — Y92410 Unspecified street and highway as the place of occurrence of the external cause: Secondary | ICD-10-CM | POA: Diagnosis not present

## 2016-09-11 DIAGNOSIS — I10 Essential (primary) hypertension: Secondary | ICD-10-CM | POA: Insufficient documentation

## 2016-09-11 DIAGNOSIS — M25562 Pain in left knee: Secondary | ICD-10-CM | POA: Insufficient documentation

## 2016-09-11 DIAGNOSIS — E119 Type 2 diabetes mellitus without complications: Secondary | ICD-10-CM | POA: Insufficient documentation

## 2016-09-11 DIAGNOSIS — M7918 Myalgia, other site: Secondary | ICD-10-CM

## 2016-09-11 DIAGNOSIS — M25531 Pain in right wrist: Secondary | ICD-10-CM | POA: Insufficient documentation

## 2016-09-11 DIAGNOSIS — M545 Low back pain: Secondary | ICD-10-CM | POA: Diagnosis not present

## 2016-09-11 DIAGNOSIS — M25511 Pain in right shoulder: Secondary | ICD-10-CM | POA: Diagnosis not present

## 2016-09-11 DIAGNOSIS — Z87891 Personal history of nicotine dependence: Secondary | ICD-10-CM | POA: Diagnosis not present

## 2016-09-11 DIAGNOSIS — M25561 Pain in right knee: Secondary | ICD-10-CM | POA: Insufficient documentation

## 2016-09-11 DIAGNOSIS — Y9389 Activity, other specified: Secondary | ICD-10-CM | POA: Diagnosis not present

## 2016-09-11 DIAGNOSIS — Y999 Unspecified external cause status: Secondary | ICD-10-CM | POA: Diagnosis not present

## 2016-09-11 HISTORY — DX: Essential (primary) hypertension: I10

## 2016-09-11 HISTORY — DX: Type 2 diabetes mellitus without complications: E11.9
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
## 2016-09-12 MED ORDER — TRAMADOL HCL 50 MG PO TABS: 50.0000 mg | ORAL_TABLET | Freq: Four times a day (QID) | ORAL | 0 refills | Status: DC | PRN

## 2016-09-12 MED ORDER — DIAZEPAM 2 MG PO TABS
2.0000 mg | ORAL_TABLET | Freq: Once | ORAL | Status: AC
  Administered 2016-09-12: 2 mg via ORAL
  Filled 2016-09-12: qty 1

## 2016-09-12 MED ORDER — IBUPROFEN 600 MG PO TABS
600.0000 mg | ORAL_TABLET | Freq: Once | ORAL | Status: AC
Start: 2016-09-12 — End: 2016-09-12
  Administered 2016-09-12: 600 mg via ORAL
  Filled 2016-09-12: qty 1
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
## 2019-07-26 ENCOUNTER — Other Ambulatory Visit: Payer: Self-pay

## 2019-07-26 ENCOUNTER — Emergency Department: Payer: Medicare HMO

## 2019-07-26 ENCOUNTER — Inpatient Hospital Stay
Admission: EM | Admit: 2019-07-26 | Discharge: 2019-07-29 | DRG: 690 | Disposition: A | Discharge status: Home Health | Payer: Medicare HMO | Attending: Internal Medicine | Admitting: Internal Medicine

## 2019-07-26 DIAGNOSIS — N179 Acute kidney failure, unspecified: Secondary | ICD-10-CM | POA: Diagnosis present

## 2019-07-26 DIAGNOSIS — N390 Urinary tract infection, site not specified: Secondary | ICD-10-CM | POA: Diagnosis present

## 2019-07-26 DIAGNOSIS — R509 Fever, unspecified: Secondary | ICD-10-CM | POA: Diagnosis present

## 2019-07-26 DIAGNOSIS — B9620 Unspecified Escherichia coli [E. coli] as the cause of diseases classified elsewhere: Secondary | ICD-10-CM | POA: Diagnosis present

## 2019-07-26 DIAGNOSIS — I10 Essential (primary) hypertension: Secondary | ICD-10-CM | POA: Diagnosis present

## 2019-07-26 DIAGNOSIS — Z20828 Contact with and (suspected) exposure to other viral communicable diseases: Secondary | ICD-10-CM | POA: Diagnosis present

## 2019-07-26 DIAGNOSIS — E119 Type 2 diabetes mellitus without complications: Secondary | ICD-10-CM

## 2019-07-26 DIAGNOSIS — R109 Unspecified abdominal pain: Secondary | ICD-10-CM

## 2019-07-26 DIAGNOSIS — B964 Proteus (mirabilis) (morganii) as the cause of diseases classified elsewhere: Secondary | ICD-10-CM | POA: Diagnosis present

## 2019-07-26 DIAGNOSIS — Z87891 Personal history of nicotine dependence: Secondary | ICD-10-CM | POA: Diagnosis not present

## 2019-07-26 LAB — HEPATIC FUNCTION PANEL
ALT: 29 U/L (ref 0–44)
AST: 43 U/L — ABNORMAL HIGH (ref 15–41)
Albumin: 3 g/dL — ABNORMAL LOW (ref 3.5–5.0)
Alkaline Phosphatase: 45 U/L (ref 38–126)
Bilirubin, Direct: 0.3 mg/dL — ABNORMAL HIGH (ref 0.0–0.2)
Indirect Bilirubin: 0.8 mg/dL (ref 0.3–0.9)
Total Bilirubin: 1.1 mg/dL (ref 0.3–1.2)
Total Protein: 7.2 g/dL (ref 6.5–8.1)

## 2019-07-26 LAB — CBC WITH DIFFERENTIAL/PLATELET
Abs Immature Granulocytes: 0.03 10*3/uL (ref 0.00–0.07)
Basophils Absolute: 0 10*3/uL (ref 0.0–0.1)
Basophils Relative: 0 %
Eosinophils Absolute: 0 10*3/uL (ref 0.0–0.5)
Eosinophils Relative: 0 %
HCT: 38.3 % — ABNORMAL LOW (ref 39.0–52.0)
Hemoglobin: 12.3 g/dL — ABNORMAL LOW (ref 13.0–17.0)
Immature Granulocytes: 1 %
Lymphocytes Relative: 14 %
Lymphs Abs: 0.9 10*3/uL (ref 0.7–4.0)
MCH: 28 pg (ref 26.0–34.0)
MCHC: 32.1 g/dL (ref 30.0–36.0)
MCV: 87 fL (ref 80.0–100.0)
Monocytes Absolute: 0.5 10*3/uL (ref 0.1–1.0)
Monocytes Relative: 7 %
Neutro Abs: 5 10*3/uL (ref 1.7–7.7)
Neutrophils Relative %: 78 %
Platelets: 142 10*3/uL — ABNORMAL LOW (ref 150–400)
RBC: 4.4 MIL/uL (ref 4.22–5.81)
RDW: 13.2 % (ref 11.5–15.5)
WBC: 6.3 10*3/uL (ref 4.0–10.5)
nRBC: 0 % (ref 0.0–0.2)

## 2019-07-26 LAB — BASIC METABOLIC PANEL
Anion gap: 13 (ref 5–15)
BUN: 31 mg/dL — ABNORMAL HIGH (ref 8–23)
CO2: 20 mmol/L — ABNORMAL LOW (ref 22–32)
Calcium: 8 mg/dL — ABNORMAL LOW (ref 8.9–10.3)
Chloride: 105 mmol/L (ref 98–111)
Creatinine, Ser: 1.88 mg/dL — ABNORMAL HIGH (ref 0.61–1.24)
GFR calc Af Amer: 38 mL/min — ABNORMAL LOW (ref >60)
GFR calc non Af Amer: 33 mL/min — ABNORMAL LOW (ref >60)
Glucose, Bld: 129 mg/dL — ABNORMAL HIGH (ref 70–99)
Potassium: 3.7 mmol/L (ref 3.5–5.1)
Sodium: 138 mmol/L (ref 135–145)

## 2019-07-26 LAB — URINALYSIS, ROUTINE W REFLEX MICROSCOPIC
Bilirubin Urine: NEGATIVE
Glucose, UA: NEGATIVE mg/dL
Ketones, ur: 20 mg/dL — AB
Nitrite: NEGATIVE
Protein, ur: 100 mg/dL — AB
Specific Gravity, Urine: 1.015 (ref 1.005–1.030)
WBC, UA: >50 WBC/hpf — ABNORMAL HIGH (ref 0–5)
pH: 6 (ref 5.0–8.0)

## 2019-07-26 LAB — SARS CORONAVIRUS 2 BY RT PCR (HOSPITAL ORDER, PERFORMED IN ~~LOC~~ HOSPITAL LAB): SARS Coronavirus 2: NEGATIVE

## 2019-07-26 LAB — LACTIC ACID, PLASMA: Lactic Acid, Venous: 1.4 mmol/L (ref 0.5–1.9)

## 2019-07-26 MED ORDER — SODIUM CHLORIDE 0.9 % IV BOLUS
1000.0000 mL | Freq: Once | INTRAVENOUS | Status: AC
  Administered 2019-07-26: 23:00:00 1000 mL via INTRAVENOUS

## 2019-07-26 MED ORDER — ACETAMINOPHEN 500 MG PO TABS
1000.0000 mg | ORAL_TABLET | Freq: Once | ORAL | Status: AC
  Administered 2019-07-26: 1000 mg via ORAL
  Filled 2019-07-26: qty 2

## 2019-07-26 MED ORDER — SODIUM CHLORIDE 0.9 % IV BOLUS
500.0000 mL | Freq: Once | INTRAVENOUS | Status: AC
  Administered 2019-07-26: 500 mL via INTRAVENOUS

## 2019-07-26 MED ORDER — SODIUM CHLORIDE 0.9 % IV SOLN
1.0000 g | Freq: Once | INTRAVENOUS | Status: AC
  Administered 2019-07-26: 1 g via INTRAVENOUS
  Filled 2019-07-26: qty 10

## 2019-07-26 NOTE — ED Notes (Signed)
Daughter Levada Dy: [phone]

## 2019-07-26 NOTE — ED Provider Notes (Signed)
[facility] Emergency Department Provider Note  ____________________________________________   First MD Initiated Contact with Patient 07/26/19 2054     (approximate)  I have reviewed the triage vital signs and the nursing notes.  History  Chief Complaint Fever    HPI [name] is a 84 y.o. male with a history of diabetes, hypertension, who presents the emergency department for a fever.  He reports feeling generally unwell for at least 1 week.  He complains of a headache, as well as lower abdominal pain.  Also reports feeling short of breath.  He states he has a roommate that recently tested positive for COVID, but they have been staying separate.  EMS was concerned for potential UTI.  He is febrile on arrival.         Past Medical Hx Past Medical History:  Diagnosis Date  . Diabetes mellitus without complication ([place])   . Hypertension     Problem List There are no active problems to display for this patient.   Past Surgical Hx Past Surgical History:  Procedure Laterality Date  . BACK SURGERY    . HERNIA REPAIR      Medications Prior to Admission medications   Medication Sig Start Date End Date Taking? Authorizing Provider  traMADol (ULTRAM) 50 MG tablet Take 1 tablet (50 mg total) by mouth every 6 (six) hours as needed. 09/12/16   Loney Hering, MD    Allergies Patient has no known allergies.  Family Hx No family history on file.  Social Hx Social History   Tobacco Use  . Smoking status: Former Research scientist (life sciences)  . Smokeless tobacco: Never Used  Substance Use Topics  . Alcohol use: Not on file  . Drug use: Not on file     Review of Systems  Constitutional: + for fever. Negative for chills. Eyes: Negative for visual changes. ENT: Negative for sore throat. Cardiovascular: Negative for chest pain. Respiratory: Negative for shortness of breath. Gastrointestinal: + for abdominal pain. Negative for nausea. Negative for vomiting.  Genitourinary: Negative for dysuria. Musculoskeletal: Negative for leg swelling. Skin: Negative for rash. Neurological: + for for headaches.   Physical Exam  Vital Signs: ED Triage Vitals  Enc Vitals Group     BP 07/26/19 2054 (!) 147/71     Pulse Rate 07/26/19 2054 88     Resp 07/26/19 2054 18     Temp 07/26/19 2054 (!) 102 F (38.9 C)     Temp Source 07/26/19 2054 Oral     SpO2 07/26/19 2054 95 %     Weight 07/26/19 2050 233 lb (105.7 kg)     Height 07/26/19 2050 5\' 8"  (1.727 m)     Head Circumference --      Peak Flow --      Pain Score 07/26/19 2050 2     Pain Loc --      Pain Edu? --      Excl. in [place]? --     Constitutional: Alert and oriented.  Eyes: Conjunctivae clear. Sclera anicteric. Head: Normocephalic. Atraumatic. Nose: No congestion. No rhinorrhea. Mouth/Throat: Mucous membranes are dry.  Neck: No stridor.   Cardiovascular: Normal rate, regular rhythm. Extremities well perfused. Respiratory: Normal respiratory effort.  Lungs CTAB. No crackles or wheezing. Oxygen 94% and above on RA. Gastrointestinal: Soft. Mild lower abdominal tenderness, no rebound or guarding. Remainder of abdomen is NT. No distention.  Musculoskeletal: No lower extremity edema. Neurologic:  Normal speech and language. No gross focal neurologic deficits  are appreciated.  Skin: Skin is warm, dry and intact. No rash noted. Psychiatric: Mood and affect are appropriate for situation.  EKG  Personally reviewed.   Rate: 75 Rhythm: sinus Axis: LAD Intervals: WNL No STEMI    Radiology  CXR: IMPRESSION: No active disease.   Procedures  Procedure(s) performed (including critical care):  Procedures   Initial Impression / Assessment and Plan / ED Course  84 y.o. male who presents to the ED for fevers, lower abdominal pain, generalized weakness, as above.  Ddx: UTI, PNA/pulmonary infection, COVID  Plan: labs, XR, fluids, COVID swab, cultures. Febrile here, will treat and  give fluids. BP and HR within normal limits, will hold on empiric sepsis antibiotics for now given this.  Work-up reveals significant UTI. AKI with Cr at 1.88.  COVID negative.  Will treat with ceftriaxone, urine sent for culture, receiving fluids as above.  Lactic acid within normal limits.  Fever improved with antipyretics.  Discussed with hospitalist for admission.  Final Clinical Impression(s) / ED Diagnosis  Final diagnoses:  Fever in adult  Urinary tract infection in elderly patient       Note:  This document was prepared using Dragon voice recognition software and may include unintentional dictation errors.   Lilia Pro., MD 07/27/19 [phone]

## 2019-07-26 NOTE — ED Triage Notes (Signed)
Pt to ED via EMS from home. Per pt he has been feeling "sick" x7 days. Pt states currently he has lower abd pain and a headache. Pt does live with someone who recently tested positive for covid. Pt had test done that resulted inconclusive. Pt has cough and fever.

## 2019-07-26 NOTE — H&P (Signed)
[place] [facility] [place] NAME: [name]    MR#:  [id number]  DATE OF BIRTH:  1935-01-03  DATE OF ADMISSION:  07/26/2019  PRIMARY CARE PHYSICIAN: Guadalupe Maple, MD   REQUESTING/REFERRING PHYSICIAN: Joan Mayans, MD  CHIEF COMPLAINT:   Chief Complaint  Patient presents with  . Fever    HISTORY OF PRESENT ILLNESS:  [name]  is a 84 y.o. male who presents with chief complaint as above.  Patient presents to the ED with a complaint of several days of malaise, as well as fever at home.  His work-up here shows UTI, without elevated white count or other criteria for sepsis.  He has some AKI as well.  Hospitalist called for admission  PAST MEDICAL HISTORY:   Past Medical History:  Diagnosis Date  . Diabetes mellitus without complication ([place])   . Hypertension      PAST SURGICAL HISTORY:   Past Surgical History:  Procedure Laterality Date  . BACK SURGERY    . HERNIA REPAIR       SOCIAL HISTORY:   Social History   Tobacco Use  . Smoking status: Former Research scientist (life sciences)  . Smokeless tobacco: Never Used  Substance Use Topics  . Alcohol use: Not Currently     FAMILY HISTORY:    Family history reviewed and is non-contributory DRUG ALLERGIES:  No Known Allergies  MEDICATIONS AT HOME:   Prior to Admission medications   Medication Sig Start Date End Date Taking? Authorizing Provider  traMADol (ULTRAM) 50 MG tablet Take 1 tablet (50 mg total) by mouth every 6 (six) hours as needed. 09/12/16   Loney Hering, MD    REVIEW OF SYSTEMS:  Review of Systems  Constitutional: Positive for fever and malaise/fatigue. Negative for chills and weight loss.  HENT: Negative for ear pain, hearing loss and tinnitus.   Eyes: Negative for blurred vision, double vision, pain and redness.  Respiratory: Negative for cough, hemoptysis and shortness of breath.   Cardiovascular: Negative for chest pain, palpitations, orthopnea and leg swelling.   Gastrointestinal: Negative for abdominal pain, constipation, diarrhea, nausea and vomiting.  Genitourinary: Negative for dysuria, frequency and hematuria.  Musculoskeletal: Negative for back pain, joint pain and neck pain.  Skin:       No acne, rash, or lesions  Neurological: Negative for dizziness, tremors, focal weakness and weakness.  Endo/Heme/Allergies: Negative for polydipsia. Does not bruise/bleed easily.  Psychiatric/Behavioral: Negative for depression. The patient is not nervous/anxious and does not have insomnia.      VITAL SIGNS:   Vitals:   07/26/19 2050 07/26/19 2054 07/26/19 2214  BP:  (!) 147/71   Pulse:  88   Resp:  18   Temp:  (!) 102 F (38.9 C) 99.6 F (37.6 C)  TempSrc:  Oral Oral  SpO2:  95%   Weight: 105.7 kg    Height: 5\' 8"  (1.727 m)     Wt Readings from Last 3 Encounters:  07/26/19 105.7 kg  09/11/16 105.7 kg    PHYSICAL EXAMINATION:  Physical Exam  Vitals reviewed. Constitutional: He is oriented to person, place, and time. He appears well-developed and well-nourished. No distress.  HENT:  Head: Normocephalic and atraumatic.  Mouth/Throat: Oropharynx is clear and moist.  Eyes: Pupils are equal, round, and reactive to light. Conjunctivae and EOM are normal. No scleral icterus.  Neck: Normal range of motion. Neck supple. No JVD present. No thyromegaly present.  Cardiovascular: Normal rate, regular rhythm and intact  distal pulses. Exam reveals no gallop and no friction rub.  No murmur heard. Respiratory: Effort normal and breath sounds normal. No respiratory distress. He has no wheezes. He has no rales.  GI: Soft. Bowel sounds are normal. He exhibits no distension. There is no abdominal tenderness.  Musculoskeletal: Normal range of motion.        General: No edema.     Comments: No arthritis, no gout  Lymphadenopathy:    He has no cervical adenopathy.  Neurological: He is alert and oriented to person, place, and time. No cranial nerve deficit.   No dysarthria, no aphasia  Skin: Skin is warm and dry. No rash noted. No erythema.  Psychiatric: He has a normal mood and affect. His behavior is normal. Judgment and thought content normal.    LABORATORY PANEL:   CBC Recent Labs  Lab 07/26/19 2101  WBC 6.3  HGB 12.3*  HCT 38.3*  PLT 142*   ------------------------------------------------------------------------------------------------------------------  Chemistries  Recent Labs  Lab 07/26/19 2101  NA 138  K 3.7  CL 105  CO2 20*  GLUCOSE 129*  BUN 31*  CREATININE 1.88*  CALCIUM 8.0*  AST 43*  ALT 29  ALKPHOS 45  BILITOT 1.1   ------------------------------------------------------------------------------------------------------------------  Cardiac Enzymes No results for input(s): TROPONINI in the last 168 hours. ------------------------------------------------------------------------------------------------------------------  RADIOLOGY:  Dg Chest 1 View  Result Date: 07/26/2019 CLINICAL DATA:  has been feeling "sick" x7 days. Pt states currently he has lower abd pain and a headache. Pt does live with someone who recently tested positive for covid. Pt had test done that resulted inconclusive. Pt has cough and fever. EXAM: CHEST  1 VIEW COMPARISON:  01/06/2014. FINDINGS: Cardiac silhouette is normal in size. No mediastinal or hilar masses. No evidence of adenopathy. Chronic scarring in the right upper lobe. Lungs otherwise clear. No convincing pleural effusion. No pneumothorax. Skeletal structures are grossly intact. IMPRESSION: No active disease. Electronically Signed   By: Lajean Manes M.D.   On: 07/26/2019 21:48    EKG:   Orders placed or performed during the hospital encounter of 07/26/19  . EKG 12-Lead  . EKG 12-Lead  . EKG 12-Lead  . EKG 12-Lead    IMPRESSION AND PLAN:  Principal Problem:   UTI (urinary tract infection) - on IV antibiotics, urine culture sent Active Problems:   HTN (hypertension) -  home dose antihypertensives   Diabetes (HCC) - sliding scale insulin coverage  Chart review performed and case discussed with ED provider. Labs, imaging and/or ECG reviewed by provider and discussed with patient/family. Management plans discussed with the patient and/or family.  COVID-19 status: Tested negative     DVT PROPHYLAXIS: SubQ heparin  GI PROPHYLAXIS:  None  ADMISSION STATUS: Inpatient     CODE STATUS: Full  TOTAL TIME TAKING CARE OF THIS PATIENT: 45 minutes.   This patient was evaluated in the context of the global COVID-19 pandemic, which necessitated consideration that the patient might be at risk for infection with the SARS-CoV-2 virus that causes COVID-19. Institutional protocols and algorithms that pertain to the evaluation of patients at risk for COVID-19 are in a state of rapid change based on information released by regulatory bodies including the CDC and federal and state organizations. These policies and algorithms were followed to the best of this provider's knowledge to date during the patient's care at this facility.  Ethlyn Daniels 07/26/2019, 11:15 PM  [facility]  Office  [phone]  CC: Primary care physician; Guadalupe Maple,  MD  Note:  This document was prepared using Dragon voice recognition software and may include unintentional dictation errors.

## 2019-07-27 ENCOUNTER — Other Ambulatory Visit: Payer: Self-pay

## 2019-07-27 LAB — CBC
HCT: 38.3 % — ABNORMAL LOW (ref 39.0–52.0)
Hemoglobin: 12.2 g/dL — ABNORMAL LOW (ref 13.0–17.0)
MCH: 27.8 pg (ref 26.0–34.0)
MCHC: 31.9 g/dL (ref 30.0–36.0)
MCV: 87.2 fL (ref 80.0–100.0)
Platelets: 149 10*3/uL — ABNORMAL LOW (ref 150–400)
RBC: 4.39 MIL/uL (ref 4.22–5.81)
RDW: 13.2 % (ref 11.5–15.5)
WBC: 6.7 10*3/uL (ref 4.0–10.5)
nRBC: 0 % (ref 0.0–0.2)

## 2019-07-27 LAB — BASIC METABOLIC PANEL
Anion gap: 13 (ref 5–15)
BUN: 28 mg/dL — ABNORMAL HIGH (ref 8–23)
CO2: 19 mmol/L — ABNORMAL LOW (ref 22–32)
Calcium: 7.8 mg/dL — ABNORMAL LOW (ref 8.9–10.3)
Chloride: 107 mmol/L (ref 98–111)
Creatinine, Ser: 1.49 mg/dL — ABNORMAL HIGH (ref 0.61–1.24)
GFR calc Af Amer: 50 mL/min — ABNORMAL LOW (ref >60)
GFR calc non Af Amer: 43 mL/min — ABNORMAL LOW (ref >60)
Glucose, Bld: 124 mg/dL — ABNORMAL HIGH (ref 70–99)
Potassium: 3.6 mmol/L (ref 3.5–5.1)
Sodium: 139 mmol/L (ref 135–145)

## 2019-07-27 LAB — GLUCOSE, CAPILLARY
Glucose-Capillary: 110 mg/dL — ABNORMAL HIGH (ref 70–99)
Glucose-Capillary: 143 mg/dL — ABNORMAL HIGH (ref 70–99)
Glucose-Capillary: 91 mg/dL (ref 70–99)
Glucose-Capillary: 127 mg/dL — ABNORMAL HIGH (ref 70–99)

## 2019-07-27 MED ORDER — INSULIN ASPART 100 UNIT/ML ~~LOC~~ SOLN
0.0000 [IU] | Freq: Three times a day (TID) | SUBCUTANEOUS | Status: DC
  Administered 2019-07-27: 1 [IU] via SUBCUTANEOUS
  Filled 2019-07-27: qty 1

## 2019-07-27 MED ORDER — INFLUENZA VAC A&B SA ADJ QUAD 0.5 ML IM PRSY
0.5000 mL | PREFILLED_SYRINGE | INTRAMUSCULAR | Status: DC
  Filled 2019-07-27: qty 0.5

## 2019-07-27 MED ORDER — CEFTRIAXONE SODIUM 1 G IJ SOLR
1.0000 g | INTRAMUSCULAR | Status: DC
  Administered 2019-07-27 – 2019-07-28 (×2): 1 g via INTRAVENOUS
  Filled 2019-07-27 (×2): qty 1
  Filled 2019-07-27: qty 10

## 2019-07-27 MED ORDER — PANTOPRAZOLE SODIUM 40 MG PO TBEC
40.0000 mg | DELAYED_RELEASE_TABLET | Freq: Two times a day (BID) | ORAL | Status: DC
  Administered 2019-07-27 – 2019-07-29 (×5): 40 mg via ORAL
  Filled 2019-07-27 (×5): qty 1

## 2019-07-27 MED ORDER — ONDANSETRON HCL 4 MG/2ML IJ SOLN: 4.0000 mg | Freq: Four times a day (QID) | INTRAMUSCULAR | Status: DC | PRN

## 2019-07-27 MED ORDER — MAGNESIUM OXIDE 400 (241.3 MG) MG PO TABS
400.0000 mg | ORAL_TABLET | Freq: Two times a day (BID) | ORAL | Status: DC
  Administered 2019-07-27 – 2019-07-29 (×5): 400 mg via ORAL
  Filled 2019-07-27 (×5): qty 1

## 2019-07-27 MED ORDER — AMLODIPINE BESYLATE 10 MG PO TABS
10.0000 mg | ORAL_TABLET | Freq: Every day | ORAL | Status: DC
  Administered 2019-07-27 – 2019-07-29 (×3): 10 mg via ORAL
  Filled 2019-07-27 (×3): qty 1

## 2019-07-27 MED ORDER — OXYBUTYNIN CHLORIDE 5 MG PO TABS
5.0000 mg | ORAL_TABLET | Freq: Two times a day (BID) | ORAL | Status: DC
  Administered 2019-07-27 – 2019-07-29 (×5): 5 mg via ORAL
  Filled 2019-07-27 (×5): qty 1

## 2019-07-27 MED ORDER — ACETAMINOPHEN 325 MG PO TABS
650.0000 mg | ORAL_TABLET | Freq: Four times a day (QID) | ORAL | Status: DC | PRN
  Administered 2019-07-27: 14:00:00 650 mg via ORAL
  Filled 2019-07-27: qty 2

## 2019-07-27 MED ORDER — INSULIN DETEMIR 100 UNIT/ML ~~LOC~~ SOLN
40.0000 [IU] | Freq: Every day | SUBCUTANEOUS | Status: DC
  Administered 2019-07-27 – 2019-07-29 (×3): 40 [IU] via SUBCUTANEOUS
  Filled 2019-07-27 (×4): qty 0.4

## 2019-07-27 MED ORDER — ISOSORBIDE MONONITRATE ER 60 MG PO TB24
60.0000 mg | ORAL_TABLET | Freq: Every day | ORAL | Status: DC
  Administered 2019-07-27 – 2019-07-29 (×3): 60 mg via ORAL
  Filled 2019-07-27 (×3): qty 1

## 2019-07-27 MED ORDER — ENOXAPARIN SODIUM 40 MG/0.4ML ~~LOC~~ SOLN
40.0000 mg | SUBCUTANEOUS | Status: DC
  Administered 2019-07-27 – 2019-07-28 (×2): 40 mg via SUBCUTANEOUS
  Filled 2019-07-27 (×2): qty 0.4

## 2019-07-27 MED ORDER — ACYCLOVIR 200 MG PO CAPS
800.0000 mg | ORAL_CAPSULE | Freq: Every day | ORAL | Status: DC
  Administered 2019-07-27 – 2019-07-29 (×3): 800 mg via ORAL
  Filled 2019-07-27 (×3): qty 4

## 2019-07-27 MED ORDER — SODIUM CHLORIDE 0.9 % IV SOLN
INTRAVENOUS | Status: DC
  Administered 2019-07-27 – 2019-07-28 (×3): via INTRAVENOUS

## 2019-07-27 MED ORDER — HYDRALAZINE HCL 20 MG/ML IJ SOLN: 10.0000 mg | Freq: Four times a day (QID) | INTRAMUSCULAR | Status: DC | PRN

## 2019-07-27 MED ORDER — BUDESONIDE 0.25 MG/2ML IN SUSP
0.2500 mg | Freq: Two times a day (BID) | RESPIRATORY_TRACT | Status: DC
  Administered 2019-07-27 – 2019-07-29 (×5): 0.25 mg via RESPIRATORY_TRACT
  Filled 2019-07-27 (×6): qty 2

## 2019-07-27 MED ORDER — HEPARIN SODIUM (PORCINE) 5000 UNIT/ML IJ SOLN
5000.0000 [IU] | Freq: Three times a day (TID) | INTRAMUSCULAR | Status: DC
  Administered 2019-07-27: 5000 [IU] via SUBCUTANEOUS
  Filled 2019-07-27: qty 1

## 2019-07-27 MED ORDER — ACETAMINOPHEN 650 MG RE SUPP: 650.0000 mg | Freq: Four times a day (QID) | RECTAL | Status: DC | PRN

## 2019-07-27 MED ORDER — ONDANSETRON HCL 4 MG PO TABS: 4.0000 mg | ORAL_TABLET | Freq: Four times a day (QID) | ORAL | Status: DC | PRN

## 2019-07-27 MED ORDER — ROSUVASTATIN CALCIUM 20 MG PO TABS
40.0000 mg | ORAL_TABLET | Freq: Every day | ORAL | Status: DC
  Administered 2019-07-27 – 2019-07-29 (×3): 40 mg via ORAL
  Filled 2019-07-27 (×3): qty 2

## 2019-07-27 NOTE — Progress Notes (Signed)
[place] [facility] [place] NAME: [name]    MR#:  [id number]  DATE OF BIRTH:  02/16/1935  SUBJECTIVE:  CHIEF COMPLAINT:   Chief Complaint  Patient presents with  . Fever   No new complaint this morning.  Maximum temperature of 102 yesterday.  No fevers overnight.  REVIEW OF SYSTEMS:  Review of Systems  Constitutional: Negative for chills.       Fevers yesterday  HENT: Negative for hearing loss and tinnitus.   Eyes: Negative for blurred vision and double vision.  Respiratory: Negative for cough and shortness of breath.   Cardiovascular: Negative for chest pain and palpitations.  Gastrointestinal: Negative for heartburn and nausea.  Genitourinary: Negative for dysuria and hematuria.  Musculoskeletal: Negative for myalgias and neck pain.  Neurological: Negative for dizziness and headaches.  Psychiatric/Behavioral: Negative for depression and hallucinations.    DRUG ALLERGIES:  No Known Allergies VITALS:  Blood pressure (!) 157/86, pulse 89, temperature 99.5 F (37.5 C), resp. rate 18, height 5\' 8"  (1.727 m), weight 105.7 kg, SpO2 97 %. PHYSICAL EXAMINATION:    Physical Exam  Constitutional: He is oriented to person, place, and time. He appears well-developed.  HENT:  Head: Normocephalic and atraumatic.  Right Ear: External ear normal.  Eyes: Pupils are equal, round, and reactive to light. Conjunctivae are normal. Right eye exhibits no discharge.  Neck: Normal range of motion. Neck supple. No thyromegaly present.  Cardiovascular: Normal rate, regular rhythm and normal heart sounds.  Respiratory: Effort normal and breath sounds normal. No respiratory distress.  GI: Soft. Bowel sounds are normal. He exhibits no distension.  Musculoskeletal: Normal range of motion.        General: No edema.  Neurological: He is alert and oriented to person, place, and time.  Skin: Skin is warm. He is not diaphoretic. No erythema.  Psychiatric:  He has a normal mood and affect. His behavior is normal.    LABORATORY PANEL:  Male CBC Recent Labs  Lab 07/27/19 0724  WBC 6.7  HGB 12.2*  HCT 38.3*  PLT 149*   ------------------------------------------------------------------------------------------------------------------ Chemistries  Recent Labs  Lab 07/26/19 2101 07/27/19 0724  NA 138 139  K 3.7 3.6  CL 105 107  CO2 20* 19*  GLUCOSE 129* 124*  BUN 31* 28*  CREATININE 1.88* 1.49*  CALCIUM 8.0* 7.8*  AST 43*  --   ALT 29  --   ALKPHOS 45  --   BILITOT 1.1  --    RADIOLOGY:  Dg Chest 1 View  Result Date: 07/26/2019 CLINICAL DATA:  has been feeling "sick" x7 days. Pt states currently he has lower abd pain and a headache. Pt does live with someone who recently tested positive for covid. Pt had test done that resulted inconclusive. Pt has cough and fever. EXAM: CHEST  1 VIEW COMPARISON:  01/06/2014. FINDINGS: Cardiac silhouette is normal in size. No mediastinal or hilar masses. No evidence of adenopathy. Chronic scarring in the right upper lobe. Lungs otherwise clear. No convincing pleural effusion. No pneumothorax. Skeletal structures are grossly intact. IMPRESSION: No active disease. Electronically Signed   By: Lajean Manes M.D.   On: 07/26/2019 21:48   ASSESSMENT AND PLAN:  Patient is an 84 year old male with history of diabetes mellitus and hypertension admitted with urinary tract infection and acute kidney injury.  1. UTI (urinary tract infection)  Patient currently on IV antibiotics with Rocephin pending clinical course and culture results.  IV fluid hydration.  2.  Acute kidney injury Most likely prerenal.  Renal function improving with IV fluids.  Hold off on home dose of Entresto , Lasix and spironolactone.  Follow-up on renal function in a.m.  3.  Hypertension Blood pressure trending up.  Resumed home dose of Norvasc.   Placed on IV hydralazine for systolic blood pressure greater than 160    4.   Diabetes mellitus type 2 Blood sugars controlled.  Check glycosylated hemoglobin level in a.m. sliding scale insulin coverage.   5.  Generalized weakness; physical therapy to evaluate and treat  DVT prophylaxis; patient currently on heparin   All the records are reviewed and case discussed with Care Management/Social Worker. Management plans discussed with the patient, and he is in agreement.  CODE STATUS: Full Code  TOTAL TIME TAKING CARE OF THIS PATIENT: 34 minutes.   More than 50% of the time was spent in counseling/coordination of care: YES  POSSIBLE D/C IN 2 DAYS, DEPENDING ON CLINICAL CONDITION.   Stanislawa Gaffin M.D on 07/27/2019 at 12:10 PM  Between 7am to 6pm - Pager - [phone]  After 6pm go to [email or web address] - Proofreader  [facility]  Office  [phone]  CC: Primary care physician; Guadalupe Maple, MD  Note: This dictation was prepared with Dragon dictation along with smaller phrase technology. Any transcriptional errors that result from this process are unintentional.

## 2019-07-27 NOTE — ED Notes (Addendum)
ED TO INPATIENT HANDOFF REPORT  ED Nurse Name and Phone #: gracie  S Name/Age/Gender [name] 84 y.o. male Room/Bed: ED06A/ED06A  Code Status   Code Status: Not on file  Home/SNF/Other Home Patient oriented to: self place time situation Is this baseline? Yes   Triage Complete: Triage complete  Chief Complaint flu like symptoms ems  Triage Note Pt to ED via EMS from home. Per pt he has been feeling "sick" x7 days. Pt states currently he has lower abd pain and a headache. Pt does live with someone who recently tested positive for covid. Pt had test done that resulted inconclusive. Pt has cough and fever.    Allergies No Known Allergies  Level of Care/Admitting Diagnosis ED Disposition    ED Disposition Condition [facility] Area: [place] [100120]  Level of Care: Med-Surg [16]  Covid Evaluation: Confirmed COVID Negative  Diagnosis: UTI (urinary tract infection) [id number]  Admitting Physician: Lance Coon [id number]  Attending Physician: Lance Coon [phone]  Estimated length of stay: past midnight tomorrow  Certification:: I certify this patient will need inpatient services for at least 2 midnights  PT Class (Do Not Modify): Inpatient [101]  PT Acc Code (Do Not Modify): Private [1]       B Medical/Surgery History Past Medical History:  Diagnosis Date  . Diabetes mellitus without complication ([place])   . Hypertension    Past Surgical History:  Procedure Laterality Date  . BACK SURGERY    . HERNIA REPAIR       A IV Location/Drains/Wounds Patient Lines/Drains/Airways Status   Active Line/Drains/Airways    Name:   Placement date:   Placement time:   Site:   Days:   Peripheral IV 07/26/19 Right Antecubital   07/26/19    2104    Antecubital   1          Intake/Output Last 24 hours  Intake/Output Summary (Last 24 hours) at 07/27/2019 0121 Last data filed at 07/27/2019 0121 Gross per 24 hour  Intake 600 ml   Output -  Net 600 ml    Labs/Imaging Results for orders placed or performed during the hospital encounter of 07/26/19 (from the past 48 hour(s))  CBC with Differential     Status: Abnormal   Collection Time: 07/26/19  9:01 PM  Result Value Ref Range   WBC 6.3 4.0 - 10.5 K/uL   RBC 4.40 4.22 - 5.81 MIL/uL   Hemoglobin 12.3 (L) 13.0 - 17.0 g/dL   HCT 38.3 (L) 39.0 - 52.0 %   MCV 87.0 80.0 - 100.0 fL   MCH 28.0 26.0 - 34.0 pg   MCHC 32.1 30.0 - 36.0 g/dL   RDW 13.2 11.5 - 15.5 %   Platelets 142 (L) 150 - 400 K/uL   nRBC 0.0 0.0 - 0.2 %   Neutrophils Relative % 78 %   Neutro Abs 5.0 1.7 - 7.7 K/uL   Lymphocytes Relative 14 %   Lymphs Abs 0.9 0.7 - 4.0 K/uL   Monocytes Relative 7 %   Monocytes Absolute 0.5 0.1 - 1.0 K/uL   Eosinophils Relative 0 %   Eosinophils Absolute 0.0 0.0 - 0.5 K/uL   Basophils Relative 0 %   Basophils Absolute 0.0 0.0 - 0.1 K/uL   Immature Granulocytes 1 %   Abs Immature Granulocytes 0.03 0.00 - 0.07 K/uL    Comment: Performed at [facility], [address], [place], [place] [id number]  Basic metabolic panel  Status: Abnormal   Collection Time: 07/26/19  9:01 PM  Result Value Ref Range   Sodium 138 135 - 145 mmol/L   Potassium 3.7 3.5 - 5.1 mmol/L   Chloride 105 98 - 111 mmol/L   CO2 20 (L) 22 - 32 mmol/L   Glucose, Bld 129 (H) 70 - 99 mg/dL   BUN 31 (H) 8 - 23 mg/dL   Creatinine, Ser 1.88 (H) 0.61 - 1.24 mg/dL   Calcium 8.0 (L) 8.9 - 10.3 mg/dL   GFR calc non Af Amer 33 (L) >60 mL/min   GFR calc Af Amer 38 (L) >60 mL/min   Anion gap 13 5 - 15    Comment: Performed at [facility], [address] [place], [place] [address]  Lactic acid, plasma     Status: None   Collection Time: 07/26/19  9:01 PM  Result Value Ref Range   Lactic Acid, Venous 1.4 0.5 - 1.9 mmol/L    Comment: Performed at [facility], [address], [place], [place] [address]  SARS Coronavirus 2 [facility] order, Performed in [facility]  hospital lab) Nasopharyngeal Nasopharyngeal Swab     Status: None   Collection Time: 07/26/19  9:01 PM   Specimen: Nasopharyngeal Swab  Result Value Ref Range   SARS Coronavirus 2 NEGATIVE NEGATIVE    Comment: (NOTE) If result is NEGATIVE SARS-CoV-2 target nucleic acids are NOT DETECTED. The SARS-CoV-2 RNA is generally detectable in upper and lower  respiratory specimens during the acute phase of infection. The lowest  concentration of SARS-CoV-2 viral copies this assay can detect is 250  copies / mL. A negative result does not preclude SARS-CoV-2 infection  and should not be used as the sole basis for treatment or other  patient management decisions.  A negative result may occur with  improper specimen collection / handling, submission of specimen other  than nasopharyngeal swab, presence of viral mutation(s) within the  areas targeted by this assay, and inadequate number of viral copies  (<250 copies / mL). A negative result must be combined with clinical  observations, patient history, and epidemiological information. If result is POSITIVE SARS-CoV-2 target nucleic acids are DETECTED. The SARS-CoV-2 RNA is generally detectable in upper and lower  respiratory specimens dur ing the acute phase of infection.  Positive  results are indicative of active infection with SARS-CoV-2.  Clinical  correlation with patient history and other diagnostic information is  necessary to determine patient infection status.  Positive results do  not rule out bacterial infection or co-infection with other viruses. If result is PRESUMPTIVE POSTIVE SARS-CoV-2 nucleic acids MAY BE PRESENT.   A presumptive positive result was obtained on the submitted specimen  and confirmed on repeat testing.  While 2019 novel coronavirus  (SARS-CoV-2) nucleic acids may be present in the submitted sample  additional confirmatory testing may be necessary for epidemiological  and / or clinical management purposes  to  differentiate between  SARS-CoV-2 and other Sarbecovirus currently known to infect humans.  If clinically indicated additional testing with an alternate test  methodology [id number]) is advised. The SARS-CoV-2 RNA is generally  detectable in upper and lower respiratory sp ecimens during the acute  phase of infection. The expected result is Negative. Fact Sheet for Patients:  StrictlyIdeas.no Fact Sheet for Healthcare Providers: BankingDealers.co.za This test is not yet approved or cleared by the [place] FDA and has been authorized for detection and/or diagnosis of SARS-CoV-2 by FDA under an Emergency Use Authorization (EUA).  This EUA will remain in effect (meaning this test can be used) for the duration of the COVID-19 declaration under Section 564(b)(1) of the Act, 21 U.S.C. section 360bbb-3(b)(1), unless the authorization is terminated or revoked sooner. Performed at [facility], [address] [place], [place] [address]   Hepatic function panel     Status: Abnormal   Collection Time: 07/26/19  9:01 PM  Result Value Ref Range   Total Protein 7.2 6.5 - 8.1 g/dL   Albumin 3.0 (L) 3.5 - 5.0 g/dL   AST 43 (H) 15 - 41 U/L   ALT 29 0 - 44 U/L   Alkaline Phosphatase 45 38 - 126 U/L   Total Bilirubin 1.1 0.3 - 1.2 mg/dL   Bilirubin, Direct 0.3 (H) 0.0 - 0.2 mg/dL   Indirect Bilirubin 0.8 0.3 - 0.9 mg/dL    Comment: Performed at [facility], [address] [place], [place] [address]  Urinalysis, Routine w reflex microscopic     Status: Abnormal   Collection Time: 07/26/19 10:10 PM  Result Value Ref Range   Color, Urine YELLOW (A) YELLOW   APPearance CLOUDY (A) CLEAR   Specific Gravity, Urine 1.015 1.005 - 1.030   pH 6.0 5.0 - 8.0   Glucose, UA NEGATIVE NEGATIVE mg/dL   Hgb urine dipstick MODERATE (A) NEGATIVE   Bilirubin Urine NEGATIVE NEGATIVE   Ketones, ur 20 (A) NEGATIVE mg/dL   Protein, ur 100 (A)  NEGATIVE mg/dL   Nitrite NEGATIVE NEGATIVE   Leukocytes,Ua LARGE (A) NEGATIVE   RBC / HPF 0-5 0 - 5 RBC/hpf   WBC, UA >50 (H) 0 - 5 WBC/hpf   Bacteria, UA MANY (A) NONE SEEN   Squamous Epithelial / LPF 0-5 0 - 5   WBC Clumps PRESENT     Comment: Performed at [facility], [address], [place], [place] [address]   Dg Chest 1 View  Result Date: 07/26/2019 CLINICAL DATA:  has been feeling "sick" x7 days. Pt states currently he has lower abd pain and a headache. Pt does live with someone who recently tested positive for covid. Pt had test done that resulted inconclusive. Pt has cough and fever. EXAM: CHEST  1 VIEW COMPARISON:  01/06/2014. FINDINGS: Cardiac silhouette is normal in size. No mediastinal or hilar masses. No evidence of adenopathy. Chronic scarring in the right upper lobe. Lungs otherwise clear. No convincing pleural effusion. No pneumothorax. Skeletal structures are grossly intact. IMPRESSION: No active disease. Electronically Signed   By: Lajean Manes M.D.   On: 07/26/2019 21:48    Pending Labs Unresulted Labs (From admission, onward)    Start     Ordered   07/26/19 2248  Urine culture  Add-on,   AD     07/26/19 2247   07/26/19 2105  Blood culture (routine x 2)  BLOOD CULTURE X 2,   STAT     07/26/19 2104   07/26/19 2105  Lactic acid, plasma  Now then every 2 hours,   STAT     07/26/19 2104   Signed and Held  CBC  (heparin)  Once,   R    Comments: Baseline for heparin therapy IF NOT ALREADY DRAWN.  Notify MD if PLT < 100 K.    Signed and Held   Signed and Held  Creatinine, serum  (heparin)  Once,   R    Comments: Baseline for heparin therapy IF NOT ALREADY DRAWN.    Signed and Held   Signed and  Held  Basic metabolic panel  Tomorrow morning,   R     Signed and Held   Signed and Held  CBC  Tomorrow morning,   R     Signed and Held   Signed and Held  Hemoglobin A1c  Once,   R    Comments: To assess prior glycemic control    Signed and Held           Vitals/Pain Today's Vitals   07/26/19 2200 07/26/19 2214 07/26/19 2230 07/27/19 0000  BP: 121/66  126/63 130/67  Pulse: 76  70 65  Resp: 17  (!) 25   Temp:  99.6 F (37.6 C)    TempSrc:  Oral    SpO2: 94%  94% 98%  Weight:      Height:      PainSc:        Isolation Precautions No active isolations  Medications Medications  acetaminophen (TYLENOL) tablet 1,000 mg (1,000 mg Oral Given 07/26/19 2119)  sodium chloride 0.9 % bolus 500 mL (0 mLs Intravenous Stopped 07/26/19 2214)  cefTRIAXone (ROCEPHIN) 1 g in sodium chloride 0.9 % 100 mL IVPB (0 g Intravenous Stopped 07/27/19 0121)  sodium chloride 0.9 % bolus 1,000 mL (1,000 mLs Intravenous New Bag/Given 07/26/19 2319)    Mobility Walks with assistance Low fall risk   Focused Assessments GU   R Recommendations: See Admitting Provider Note  Report given to:   Additional Notes:

## 2019-07-27 NOTE — Progress Notes (Signed)
PT Cancellation Note  Patient Details Name: [name] MRN: [id number] DOB: June 20, 1935   Cancelled Treatment:    Reason Eval/Treat Not Completed: Medical issues which prohibited therapy(Chart reviewed, RN consulted. RN reports there is a more recent temperature being put in systemand pt is now in fact with fever again. Will hold evaluation at this time.)  3:27 PM, 07/27/19 Etta Grandchild, PT, DPT Physical Therapist - [facility]  [phone] ([place])    [place] C 07/27/2019, 3:27 PM

## 2019-07-28 ENCOUNTER — Inpatient Hospital Stay: Payer: Medicare HMO

## 2019-07-28 LAB — BASIC METABOLIC PANEL
Anion gap: 7 (ref 5–15)
BUN: 31 mg/dL — ABNORMAL HIGH (ref 8–23)
CO2: 22 mmol/L (ref 22–32)
Calcium: 7.7 mg/dL — ABNORMAL LOW (ref 8.9–10.3)
Chloride: 110 mmol/L (ref 98–111)
Creatinine, Ser: 1.6 mg/dL — ABNORMAL HIGH (ref 0.61–1.24)
GFR calc Af Amer: 46 mL/min — ABNORMAL LOW (ref >60)
GFR calc non Af Amer: 40 mL/min — ABNORMAL LOW (ref >60)
Glucose, Bld: 94 mg/dL (ref 70–99)
Potassium: 3.5 mmol/L (ref 3.5–5.1)
Sodium: 139 mmol/L (ref 135–145)

## 2019-07-28 LAB — GLUCOSE, CAPILLARY
Glucose-Capillary: 87 mg/dL (ref 70–99)
Glucose-Capillary: 111 mg/dL — ABNORMAL HIGH (ref 70–99)
Glucose-Capillary: 103 mg/dL — ABNORMAL HIGH (ref 70–99)
Glucose-Capillary: 121 mg/dL — ABNORMAL HIGH (ref 70–99)

## 2019-07-28 LAB — HEMOGLOBIN A1C
Hgb A1c MFr Bld: 7.7 % — ABNORMAL HIGH (ref 4.8–5.6)
Hgb A1c MFr Bld: 8.1 % — ABNORMAL HIGH (ref 4.8–5.6)
Mean Plasma Glucose: 174 mg/dL
Mean Plasma Glucose: 185.77 mg/dL

## 2019-07-28 LAB — MAGNESIUM: Magnesium: 1.8 mg/dL (ref 1.7–2.4)

## 2019-07-28 MED ORDER — POTASSIUM CHLORIDE CRYS ER 20 MEQ PO TBCR
20.0000 meq | EXTENDED_RELEASE_TABLET | Freq: Once | ORAL | Status: AC
  Administered 2019-07-28: 11:00:00 20 meq via ORAL
  Filled 2019-07-28: qty 1

## 2019-07-28 MED ORDER — SENNOSIDES-DOCUSATE SODIUM 8.6-50 MG PO TABS
2.0000 | ORAL_TABLET | Freq: Two times a day (BID) | ORAL | Status: DC
  Administered 2019-07-29: 2 via ORAL
  Filled 2019-07-28: qty 2

## 2019-07-28 NOTE — Progress Notes (Signed)
Advanced care plan. Purpose of the Encounter: CODE STATUS Parties in Attendance:Patient Patient's Decision Capacity:Good Subjective/Patient's story: [name]  is a 84 y.o. male who presents with chief complaint as above.  Patient presents to the ED with a complaint of several days of malaise, as well as fever at home.  His work-up here shows UTI, without elevated white count or other criteria for sepsis.  He has some AKI as well.   Objective/Medical story Patient needs iv fluids and antibiotics. Goals of care determination:  Advance care directives and goals of care discussed Patient wants everything done which includes cpr,intubation and ventilator if need arises CODE STATUS: Full code Time spent discussing advanced care planning: 16 minutes

## 2019-07-28 NOTE — Progress Notes (Signed)
[facility] [place] NAME: [name]    MR#:  [id number]  DATE OF BIRTH:  06/18/1935  SUBJECTIVE:  CHIEF COMPLAINT:   Chief Complaint  Patient presents with  . Fever  Seen and evaluated today Has low-grade fever Generalized weakness No chest pain shortness of breath  REVIEW OF SYSTEMS:  Review of Systems  Constitutional: Generalized weakness Has low-grade fever HENT: Negative.   Eyes: Negative.   Respiratory: Negative.   Cardiovascular: Negative.   Gastrointestinal: Negative.   Genitourinary: Negative.   Musculoskeletal: Negative.   Skin: Negative.   Neurological: Negative.   Endo/Heme/Allergies: Negative.   Psychiatric/Behavioral: Negative.   All other systems reviewed and are negative.  DRUG ALLERGIES:  No Known Allergies VITALS:  Blood pressure 136/76, pulse 73, temperature 100.2 F (37.9 C), temperature source Oral, resp. rate 20, height 5\' 8"  (1.727 m), weight 105.7 kg, SpO2 94 %. PHYSICAL EXAMINATION:    Physical Exam  Constitutional: He is oriented to person, place, and time. He appears well-developed.  HENT:  Head: Normocephalic and atraumatic.  Right Ear: External ear normal.  Eyes: Pupils are equal, round, and reactive to light. Conjunctivae are normal. Right eye exhibits no discharge.  Neck: Normal range of motion. Neck supple. No thyromegaly present.  Cardiovascular: Normal rate, regular rhythm and normal heart sounds.  Respiratory: Effort normal and breath sounds normal. No respiratory distress.  GI: Soft. Bowel sounds are normal. He exhibits no distension.  Musculoskeletal: Normal range of motion.        General: No edema.  Neurological: He is alert and oriented to person, place, and time.  Skin: Skin is warm. He is not diaphoretic. No erythema.  Psychiatric: He has a normal mood and affect. His behavior is normal.    LABORATORY PANEL:  Male CBC Recent Labs  Lab 07/27/19 0724  WBC 6.7  HGB  12.2*  HCT 38.3*  PLT 149*   ------------------------------------------------------------------------------------------------------------------ Chemistries  Recent Labs  Lab 07/26/19 2101  07/28/19 0410  NA 138   < > 139  K 3.7   < > 3.5  CL 105   < > 110  CO2 20*   < > 22  GLUCOSE 129*   < > 94  BUN 31*   < > 31*  CREATININE 1.88*   < > 1.60*  CALCIUM 8.0*   < > 7.7*  MG  --   --  1.8  AST 43*  --   --   ALT 29  --   --   ALKPHOS 45  --   --   BILITOT 1.1  --   --    < > = values in this interval not displayed.   RADIOLOGY:  No results found. ASSESSMENT AND PLAN:  Patient is an 84 year old male with history of diabetes mellitus and hypertension admitted with urinary tract infection and acute kidney injury.  1. UTI (urinary tract infection)  Gram-negative rods Pending culture and sensitivity Patient currently on IV antibiotics with Rocephin pending clinical course and culture results.  IV fluid hydration.  2.  Acute kidney injury Most likely prerenal.  Renal function improving with IV fluids.  Hold off on home dose of Entresto , Lasix and spironolactone.  Follow-up on renal function in a.m. Creatinine improving  3.  Hypertension Blood pressure trending up.  Resumed home dose of Norvasc.   Placed on IV hydralazine for systolic blood pressure greater than 160    4.  Diabetes  mellitus type 2 Blood sugars controlled.  Check glycosylated hemoglobin level in a.m. sliding scale insulin coverage.   5.  Generalized weakness; physical therapy to evaluate and treat Pending evaluation  DVT prophylaxis; patient currently on heparin   All the records are reviewed and case discussed with Care Management/Social Worker. Management plans discussed with the patient, and he is in agreement.  CODE STATUS: Full Code  TOTAL TIME TAKING CARE OF THIS PATIENT: 33 minutes.   More than 50% of the time was spent in counseling/coordination of care: YES  POSSIBLE D/C IN 2 DAYS,  DEPENDING ON CLINICAL CONDITION.   Saundra Shelling M.D on 07/28/2019 at 10:12 AM  Between 7am to 6pm - Pager - [phone]  After 6pm go to [email or web address] - Proofreader  [facility]  Office  [phone]  CC: Primary care physician; Guadalupe Maple, MD  Note: This dictation was prepared with Dragon dictation along with smaller phrase technology. Any transcriptional errors that result from this process are unintentional.

## 2019-07-28 NOTE — Evaluation (Signed)
Physical Therapy Evaluation Patient Details Name: [name] MRN: [id number] DOB: Jan 02, 1935 Today's Date: 07/28/2019   History of Present Illness  [name]  is a 84 y.o. male who comes [facility] on 9/17 with fever x7 days. complaint of several days of malaise, as well as fever at home.  His work-up here shows UTI, without elevated white count or other criteria for sepsis.  He has some AKI as well.  Clinical Impression  Pt admitted with above diagnosis. Pt currently with functional limitations due to the deficits listed below (see "PT Problem List"). Upon entry, pt in bed, awake and agreeable to participate. DTR in room provides history. Pt is pleasant, interactive, but offers little verbally. ModA to rise to standing but pt is not confident in obtaining standing balance, ultimately does better with a RW. Once up AMB <276ft with MinGuard Assist. Functional mobility assessment demonstrates increased effort/time requirements, fair tolerance, and need for physical assistance, whereas the patient performed these at a higher level of independence PTA. Pt has heavy family support and family can help him back into the house. Pt will benefit from skilled PT intervention to increase independence and safety with basic mobility in preparation for discharge to the venue listed below.       Follow Up Recommendations Home health PT    Equipment Recommendations  Rolling walker with 5" wheels    Recommendations for Other Services       Precautions / Restrictions Precautions Precautions: Fall Precaution Comments: febrile Restrictions Weight Bearing Restrictions: No      Mobility  Bed Mobility Overal bed mobility: Modified Independent             General bed mobility comments: Additional time needed.  Transfers Overall transfer level: Needs assistance Equipment used: None Transfers: Sit to/from Stand Sit to Stand: Mod assist         General transfer comment: Then able to perfrom  again at supervision level with a RW after bed elevation and education on transfers.  Ambulation/Gait   Gait Distance (Feet): 190 Feet Assistive device: Rolling walker (2 wheeled)   Gait velocity: 0.38m/s.   General Gait Details: slow, fairly safe use of RW despite paucity of experience with RW; pt SOB but SpO2: 90% on room air  Stairs            Wheelchair Mobility    Modified Rankin (Stroke Patients Only)       Balance Overall balance assessment: Needs assistance Sitting-balance support: Feet supported;No upper extremity supported Sitting balance-[name]: Good     Standing balance support: Bilateral upper extremity supported;During functional activity Standing balance-[name]: Fair                               Pertinent Vitals/Pain Pain Assessment: No/denies pain    Home Living Family/patient expects to be discharged to:: Private residence Living Arrangements: Spouse/significant other;Children(wife, 2 DTRs, 4 Grands and 2 greatgrands) Available Help at Discharge: Family Type of Home: Mobile home Home Access: Stairs to enter Entrance Stairs-Rails: None Entrance Stairs-Number of Steps: 5(pt has struggled with stairs at baseline) Home Layout: One level Home Equipment: Cane - single point      Prior Function Level of Independence: Independent with assistive device(s)         Comments: independent with ADL, IADL; Wife helps with insulin due to visual difficulty     Hand Dominance   Dominant Hand: Right  Extremity/Trunk Assessment   Upper Extremity Assessment Upper Extremity Assessment: Generalized weakness;Overall [facility] for tasks assessed    Lower Extremity Assessment Lower Extremity Assessment: Generalized weakness;Overall WFL for tasks assessed       Communication      Cognition Arousal/Alertness: Awake/alert Behavior During Therapy: WFL for tasks assessed/performed;Flat affect Overall Cognitive Status: Within  Functional Limits for tasks assessed                                        General Comments      Exercises     Assessment/Plan    PT Assessment Patient needs continued PT services  PT Problem List Decreased strength;Decreased activity tolerance;Decreased balance;Decreased mobility;Decreased knowledge of precautions       PT Treatment Interventions DME instruction;Balance training;Gait training;Stair training;Functional mobility training;Therapeutic activities;Therapeutic exercise;Patient/family education    PT Goals (Current goals can be found in the Care Plan section)  Acute Rehab PT Goals Patient Stated Goal: regain strength and AMB with SPC PT Goal Formulation: With patient/family Time For Goal Achievement: 08/11/19 Potential to Achieve Goals: Good    Frequency Min 2X/week   Barriers to discharge        Co-evaluation               AM-PAC PT "6 Clicks" Mobility  Outcome Measure Help needed turning from your back to your side while in a flat bed without using bedrails?: None Help needed moving from lying on your back to sitting on the side of a flat bed without using bedrails?: None Help needed moving to and from a bed to a chair (including a wheelchair)?: A Little Help needed standing up from a chair using your arms (e.g., wheelchair or bedside chair)?: A Little Help needed to walk in hospital room?: A Little Help needed climbing 3-5 steps with a railing? : A Little 6 Click Score: 20    End of Session Equipment Utilized During Treatment: Gait belt Activity Tolerance: Patient tolerated treatment well;No increased pain;Patient limited by fatigue Patient left: in bed;with family/visitor present;with bed alarm set;with call bell/phone within reach Nurse Communication: Mobility status PT Visit Diagnosis: Unsteadiness on feet (R26.81);Difficulty in walking, not elsewhere classified (R26.2);Other abnormalities of gait and mobility (R26.89);Muscle  weakness (generalized) (M62.81)    Time: [id number] PT Time Calculation (min) (ACUTE ONLY): 22 min   Charges:   PT Evaluation $PT Eval Low Complexity: 1 Low          4:14 PM, 07/28/19 Etta Grandchild, PT, DPT Physical Therapist - [facility]  [phone] ([place])    Buccola,Allan C 07/28/2019, 4:07 PM

## 2019-07-29 LAB — GLUCOSE, CAPILLARY
Glucose-Capillary: 112 mg/dL — ABNORMAL HIGH (ref 70–99)
Glucose-Capillary: 105 mg/dL — ABNORMAL HIGH (ref 70–99)

## 2019-07-29 LAB — BASIC METABOLIC PANEL
Anion gap: 8 (ref 5–15)
BUN: 24 mg/dL — ABNORMAL HIGH (ref 8–23)
CO2: 22 mmol/L (ref 22–32)
Calcium: 7.9 mg/dL — ABNORMAL LOW (ref 8.9–10.3)
Chloride: 112 mmol/L — ABNORMAL HIGH (ref 98–111)
Creatinine, Ser: 1.3 mg/dL — ABNORMAL HIGH (ref 0.61–1.24)
GFR calc Af Amer: 59 mL/min — ABNORMAL LOW (ref >60)
GFR calc non Af Amer: 51 mL/min — ABNORMAL LOW (ref >60)
Glucose, Bld: 70 mg/dL (ref 70–99)
Potassium: 3.6 mmol/L (ref 3.5–5.1)
Sodium: 142 mmol/L (ref 135–145)

## 2019-07-29 LAB — URINE CULTURE: Culture: >=100000 — AB

## 2019-07-29 MED ORDER — CEPHALEXIN 500 MG PO CAPS: 500.0000 mg | ORAL_CAPSULE | Freq: Two times a day (BID) | ORAL | 0 refills | Status: AC

## 2019-07-29 NOTE — Discharge Summary (Signed)
[facility] [place] NAME: [name]    MR#:  [id number]  DATE OF BIRTH:  28-Dec-1934  DATE OF ADMISSION:  07/26/2019 ADMITTING PHYSICIAN: Lance Coon, MD  DATE OF DISCHARGE: 07/29/2019  PRIMARY CARE PHYSICIAN: Guadalupe Maple, MD   ADMISSION DIAGNOSIS:  Fever in adult [R50.9] Urinary tract infection in elderly patient [N39.0]  DISCHARGE DIAGNOSIS:  Principal Problem:   UTI (urinary tract infection) Active Problems:   HTN (hypertension)   Diabetes ([place]) E. coli and Proteus mirabilis UTI Acute kidney injury SECONDARY DIAGNOSIS:   Past Medical History:  Diagnosis Date  . Diabetes mellitus without complication ([place])   . Hypertension      ADMITTING HISTORY [name]  is a 84 y.o. male who presents with chief complaint as above.  Patient presents to the ED with a complaint of several days of malaise, as well as fever at home.  His work-up here shows UTI, without elevated white count or other criteria for sepsis.  He has some AKI as well.  Hospitalist called for admission  HOSPITAL COURSE:  Patient was admitted to medical floor.  COVID-19 test was negative.  Patient was started on IV Rocephin antibiotic and tolerated antibiotic well.  Nephrotoxic medications were held and he was given IV fluids.  Kidney functions improved.  Urine culture grew E. coli and Proteus mirabilis.  Patient switched and discharged on oral antibiotic based on culture sensitivity.  Patient will be discharged home with home health services.  CONSULTS OBTAINED:  None  DRUG ALLERGIES:  No Known Allergies  DISCHARGE MEDICATIONS:   Allergies as of 07/29/2019   No Known Allergies     Medication List    TAKE these medications   acetaminophen 500 MG tablet Commonly known as: TYLENOL Take 500 mg by mouth every 6 (six) hours as needed for mild pain or moderate pain.   acyclovir 800 MG tablet Commonly known as: ZOVIRAX Take 800 mg by mouth daily.    albuterol 108 (90 Base) MCG/ACT inhaler Commonly known as: VENTOLIN HFA Inhale 2 puffs into the lungs every 6 (six) hours as needed for wheezing or shortness of breath.   amLODipine 10 MG tablet Commonly known as: NORVASC Take 10 mg by mouth daily.   aspirin EC 81 MG tablet Take 81 mg by mouth daily.   cephALEXin 500 MG capsule Commonly known as: KEFLEX Take 1 capsule (500 mg total) by mouth 2 (two) times daily for 5 days.   diclofenac sodium 1 % Gel Commonly known as: VOLTAREN Apply 2 g topically 4 (four) times daily as needed (pain).   fluticasone 110 MCG/ACT inhaler Commonly known as: FLOVENT HFA Inhale 2 puffs into the lungs 2 (two) times daily.   furosemide 40 MG tablet Commonly known as: LASIX Take 40 mg by mouth daily.   insulin detemir 100 UNIT/ML injection Commonly known as: LEVEMIR Inject 55 Units into the skin 2 (two) times daily.   isosorbide mononitrate 60 MG 24 hr tablet Commonly known as: IMDUR Take 60 mg by mouth daily.   magnesium oxide 400 MG tablet Commonly known as: MAG-OX Take 400 mg by mouth 2 (two) times daily.   nitroGLYCERIN 0.4 MG SL tablet Commonly known as: NITROSTAT Place 0.4 mg under the tongue every 5 (five) minutes as needed for chest pain.   ondansetron 8 MG disintegrating tablet Commonly known as: ZOFRAN-ODT Place 8 mg inside cheek 2 (two) times daily as needed for nausea or vomiting.  oxybutynin 5 MG tablet Commonly known as: DITROPAN Take 5 mg by mouth 2 (two) times daily.   pantoprazole 40 MG tablet Commonly known as: PROTONIX Take 40 mg by mouth 2 (two) times daily.   rosuvastatin 40 MG tablet Commonly known as: CRESTOR Take 40 mg by mouth daily.   sacubitril-valsartan 97-103 MG Commonly known as: ENTRESTO Take 1 tablet by mouth 2 (two) times daily.   spironolactone 25 MG tablet Commonly known as: ALDACTONE Take 25 mg by mouth daily.            Durable Medical Equipment  (From admission, onward)          Start     Ordered   07/29/19 1124  For home use only DME Walker rolling  Once    Question:  Patient needs a walker to treat with the following condition  Answer:  Ambulatory dysfunction   07/29/19 1124          Today  Patient seen today Tolerating diet well Hemodynamically stable VITAL SIGNS:  Blood pressure 129/73, pulse 69, temperature 98.8 F (37.1 C), temperature source Oral, resp. rate 16, height 5\' 8"  (1.727 m), weight 105.7 kg, SpO2 95 %.  I/O:    Intake/Output Summary (Last 24 hours) at 07/29/2019 1312 Last data filed at 07/29/2019 0800 Gross per 24 hour  Intake 355.73 ml  Output 725 ml  Net -369.27 ml    PHYSICAL EXAMINATION:  Physical Exam  GENERAL:  84 y.o.-year-old patient lying in the bed with no acute distress.  LUNGS: Normal breath sounds bilaterally, no wheezing, rales,rhonchi or crepitation. No use of accessory muscles of respiration.  CARDIOVASCULAR: S1, S2 normal. No murmurs, rubs, or gallops.  ABDOMEN: Soft, non-tender, non-distended. Bowel sounds present. No organomegaly or mass.  NEUROLOGIC: Moves all 4 extremities. PSYCHIATRIC: The patient is alert and oriented x 3.  SKIN: No obvious rash, lesion, or ulcer.   DATA REVIEW:   CBC Recent Labs  Lab 07/27/19 0724  WBC 6.7  HGB 12.2*  HCT 38.3*  PLT 149*    Chemistries  Recent Labs  Lab 07/26/19 2101  07/28/19 0410 07/29/19 0548  NA 138   < > 139 142  K 3.7   < > 3.5 3.6  CL 105   < > 110 112*  CO2 20*   < > 22 22  GLUCOSE 129*   < > 94 70  BUN 31*   < > 31* 24*  CREATININE 1.88*   < > 1.60* 1.30*  CALCIUM 8.0*   < > 7.7* 7.9*  MG  --   --  1.8  --   AST 43*  --   --   --   ALT 29  --   --   --   ALKPHOS 45  --   --   --   BILITOT 1.1  --   --   --    < > = values in this interval not displayed.    Cardiac Enzymes No results for input(s): TROPONINI in the last 168 hours.  Microbiology Results  Results for orders placed or performed during the hospital encounter of  07/26/19  SARS Coronavirus 2 [facility] order, Performed in [facility] hospital lab) Nasopharyngeal Nasopharyngeal Swab     Status: None   Collection Time: 07/26/19  9:01 PM   Specimen: Nasopharyngeal Swab  Result Value Ref Range Status   SARS Coronavirus 2 NEGATIVE NEGATIVE Final    Comment: (NOTE) If result is NEGATIVE SARS-CoV-2  target nucleic acids are NOT DETECTED. The SARS-CoV-2 RNA is generally detectable in upper and lower  respiratory specimens during the acute phase of infection. The lowest  concentration of SARS-CoV-2 viral copies this assay can detect is 250  copies / mL. A negative result does not preclude SARS-CoV-2 infection  and should not be used as the sole basis for treatment or other  patient management decisions.  A negative result may occur with  improper specimen collection / handling, submission of specimen other  than nasopharyngeal swab, presence of viral mutation(s) within the  areas targeted by this assay, and inadequate number of viral copies  (<250 copies / mL). A negative result must be combined with clinical  observations, patient history, and epidemiological information. If result is POSITIVE SARS-CoV-2 target nucleic acids are DETECTED. The SARS-CoV-2 RNA is generally detectable in upper and lower  respiratory specimens dur ing the acute phase of infection.  Positive  results are indicative of active infection with SARS-CoV-2.  Clinical  correlation with patient history and other diagnostic information is  necessary to determine patient infection status.  Positive results do  not rule out bacterial infection or co-infection with other viruses. If result is PRESUMPTIVE POSTIVE SARS-CoV-2 nucleic acids MAY BE PRESENT.   A presumptive positive result was obtained on the submitted specimen  and confirmed on repeat testing.  While 2019 novel coronavirus  (SARS-CoV-2) nucleic acids may be present in the submitted sample  additional confirmatory testing  may be necessary for epidemiological  and / or clinical management purposes  to differentiate between  SARS-CoV-2 and other Sarbecovirus currently known to infect humans.  If clinically indicated additional testing with an alternate test  methodology [phone]) is advised. The SARS-CoV-2 RNA is generally  detectable in upper and lower respiratory sp ecimens during the acute  phase of infection. The expected result is Negative. Fact Sheet for Patients:  StrictlyIdeas.no Fact Sheet for Healthcare Providers: BankingDealers.co.za This test is not yet approved or cleared by the [place] FDA and has been authorized for detection and/or diagnosis of SARS-CoV-2 by FDA under an Emergency Use Authorization (EUA).  This EUA will remain in effect (meaning this test can be used) for the duration of the COVID-19 declaration under Section 564(b)(1) of the Act, 21 U.S.C. section 360bbb-3(b)(1), unless the authorization is terminated or revoked sooner. Performed at [facility], [address] [place], [place] [address]   Blood culture (routine x 2)     Status: None (Preliminary result)   Collection Time: 07/26/19  9:14 PM   Specimen: BLOOD  Result Value Ref Range Status   Specimen Description BLOOD BLOOD RIGHT HAND  Final   Special Requests   Final    BOTTLES DRAWN AEROBIC AND ANAEROBIC Blood Culture results may not be optimal due to an excessive volume of blood received in culture bottles   Culture   Final    NO GROWTH 3 DAYS Performed at [facility], [address], [place], [place] [address]    Report Status PENDING  Incomplete  Blood culture (routine x 2)     Status: None (Preliminary result)   Collection Time: 07/26/19  9:14 PM   Specimen: BLOOD  Result Value Ref Range Status   Specimen Description BLOOD RIGHT ANTECUBITAL  Final   Special Requests   Final    BOTTLES DRAWN AEROBIC AND ANAEROBIC Blood Culture results  may not be optimal due to an excessive volume of blood received in culture bottles   Culture  Final    NO GROWTH 3 DAYS Performed at [facility], [address] [place], [place] [address]    Report Status PENDING  Incomplete  Urine culture     Status: Abnormal   Collection Time: 07/26/19 10:10 PM   Specimen: Urine, Random  Result Value Ref Range Status   Specimen Description   Final    URINE, RANDOM Performed at [facility], [address] [place], [place] [address]    Special Requests   Final    NONE Performed at [facility], [address] [place], [place] [address]    Culture (A)  Final    >=100,000 COLONIES/mL ESCHERICHIA COLI 40,000 COLONIES/mL PROTEUS MIRABILIS    Report Status 07/29/2019 FINAL  Final   Organism ID, Bacteria ESCHERICHIA COLI (A)  Final   Organism ID, Bacteria PROTEUS MIRABILIS (A)  Final      Susceptibility   Escherichia coli - MIC*    AMPICILLIN 8 SENSITIVE Sensitive     CEFAZOLIN <=4 SENSITIVE Sensitive     CEFTRIAXONE <=1 SENSITIVE Sensitive     CIPROFLOXACIN <=0.25 SENSITIVE Sensitive     GENTAMICIN <=1 SENSITIVE Sensitive     IMIPENEM <=0.25 SENSITIVE Sensitive     NITROFURANTOIN <=16 SENSITIVE Sensitive     TRIMETH/SULFA <=20 SENSITIVE Sensitive     AMPICILLIN/SULBACTAM <=2 SENSITIVE Sensitive     PIP/TAZO <=4 SENSITIVE Sensitive     Extended ESBL NEGATIVE Sensitive     * >=100,000 COLONIES/mL ESCHERICHIA COLI   Proteus mirabilis - MIC*    AMPICILLIN <=2 SENSITIVE Sensitive     CEFAZOLIN <=4 SENSITIVE Sensitive     CEFTRIAXONE <=1 SENSITIVE Sensitive     CIPROFLOXACIN <=0.25 SENSITIVE Sensitive     GENTAMICIN <=1 SENSITIVE Sensitive     IMIPENEM 1 SENSITIVE Sensitive     NITROFURANTOIN 128 RESISTANT Resistant     TRIMETH/SULFA <=20 SENSITIVE Sensitive     AMPICILLIN/SULBACTAM <=2 SENSITIVE Sensitive     PIP/TAZO <=4 SENSITIVE Sensitive     * 40,000 COLONIES/mL PROTEUS MIRABILIS    RADIOLOGY:  Dg  Abd Portable 1v  Result Date: 07/28/2019 CLINICAL DATA:  Abdominal pain.  Fever. EXAM: PORTABLE ABDOMEN - 1 VIEW COMPARISON:  None. FINDINGS: The bowel gas pattern is normal. No radio-opaque calculi or other significant radiographic abnormality are seen. IMPRESSION: Negative. Electronically Signed   By: Constance Holster M.D.   On: 07/28/2019 23:17    Follow up with PCP in 1 week.  Management plans discussed with the patient, family and they are in agreement.  CODE STATUS: Full code    Code Status Orders  (From admission, onward)         Start     Ordered   07/27/19 0203  Full code  Continuous     07/27/19 0202        Code Status History    This patient has a current code status but no historical code status.   Advance Care Planning Activity      TOTAL TIME TAKING CARE OF THIS PATIENT ON DAY OF DISCHARGE: more than 35 minutes.   Saundra Shelling M.D on 07/29/2019 at 1:12 PM  Between 7am to 6pm - Pager - [phone]  After 6pm go to [email or web address] - password [email or web address] [facility]  Office  [phone]  CC: Primary care physician; Guadalupe Maple, MD  Note: This dictation was prepared with Dragon dictation along with smaller phrase technology. Any transcriptional errors that result from this  process are unintentional.

## 2019-07-29 NOTE — Progress Notes (Signed)
Discharge instructions reviewed with patient. He verbalized understanding. IV removed. Stable condition. Walker delivered and is in room. Daughter is bringing clothes for patient to get dressed and then he will be wheeled out.

## 2019-07-29 NOTE — TOC Transition Note (Signed)
Transition of Care [facility]) - CM/SW Discharge Note   Patient Details  Name: [name] MRN: [id number] Date of Birth: [birth year]  Transition of Care [facility]) CM/SW Contact:  Katrina Stack, RN Phone Number: 07/29/2019, 1:08 PM   Clinical Narrative:   Patient for discharge home today.  Lives with wife and prior to this admission independent in all his adls and driving.  Denies issues with obtaining meds, transportation or accessing medical care.  No agency preference for home health. [facility] accepts patient's insurance and accepted referral for physical therapy. Obtained walker from closet through [employer].  Patient is current with his pcp. Confirmed contact information and street address [address] [place]. Family will transport home Final next level of care: [place]     Patient Goals and CMS Choice   CMS Medicare.gov Compare Post Acute Care list provided to:: Patient Choice offered to / list presented to : Patient  Discharge Placement                       Discharge Plan and Services                DME Arranged: Walker rolling DME Agency: [facility](provided from [place])       [place] Arranged: PT [place]: [facility] Date [place]: 07/29/19 Time [place]: [id number] Representative spoke with at [place]: [place] ([place]) Interventions     Readmission Risk Interventions No flowsheet data found.

## 2019-07-29 NOTE — Progress Notes (Signed)
Nsg Discharge Note  Admit Date:  07/26/2019 Discharge date: 07/29/2019   [name] to be D/C'd Home with home health/PT & RW per MD order.  AVS completed.  Copy for chart, and copy for patient signed, and dated. Patient/caregiver able to verbalize understanding.  Discharge Medication: Allergies as of 07/29/2019   No Known Allergies     Medication List    TAKE these medications   acetaminophen 500 MG tablet Commonly known as: TYLENOL Take 500 mg by mouth every 6 (six) hours as needed for mild pain or moderate pain.   acyclovir 800 MG tablet Commonly known as: ZOVIRAX Take 800 mg by mouth daily.   albuterol 108 (90 Base) MCG/ACT inhaler Commonly known as: VENTOLIN HFA Inhale 2 puffs into the lungs every 6 (six) hours as needed for wheezing or shortness of breath.   amLODipine 10 MG tablet Commonly known as: NORVASC Take 10 mg by mouth daily.   aspirin EC 81 MG tablet Take 81 mg by mouth daily.   cephALEXin 500 MG capsule Commonly known as: KEFLEX Take 1 capsule (500 mg total) by mouth 2 (two) times daily for 5 days.   diclofenac sodium 1 % Gel Commonly known as: VOLTAREN Apply 2 g topically 4 (four) times daily as needed (pain).   fluticasone 110 MCG/ACT inhaler Commonly known as: FLOVENT HFA Inhale 2 puffs into the lungs 2 (two) times daily.   furosemide 40 MG tablet Commonly known as: LASIX Take 40 mg by mouth daily.   insulin detemir 100 UNIT/ML injection Commonly known as: LEVEMIR Inject 55 Units into the skin 2 (two) times daily.   isosorbide mononitrate 60 MG 24 hr tablet Commonly known as: IMDUR Take 60 mg by mouth daily.   magnesium oxide 400 MG tablet Commonly known as: MAG-OX Take 400 mg by mouth 2 (two) times daily.   nitroGLYCERIN 0.4 MG SL tablet Commonly known as: NITROSTAT Place 0.4 mg under the tongue every 5 (five) minutes as needed for chest pain.   ondansetron 8 MG disintegrating tablet Commonly known as: ZOFRAN-ODT Place 8 mg  inside cheek 2 (two) times daily as needed for nausea or vomiting.   oxybutynin 5 MG tablet Commonly known as: DITROPAN Take 5 mg by mouth 2 (two) times daily.   pantoprazole 40 MG tablet Commonly known as: PROTONIX Take 40 mg by mouth 2 (two) times daily.   rosuvastatin 40 MG tablet Commonly known as: CRESTOR Take 40 mg by mouth daily.   sacubitril-valsartan 97-103 MG Commonly known as: ENTRESTO Take 1 tablet by mouth 2 (two) times daily.   spironolactone 25 MG tablet Commonly known as: ALDACTONE Take 25 mg by mouth daily.            Durable Medical Equipment  (From admission, onward)         Start     Ordered   07/29/19 1124  For home use only DME Walker rolling  Once    Question:  Patient needs a walker to treat with the following condition  Answer:  Ambulatory dysfunction   07/29/19 1124          Discharge Assessment: Vitals:   07/29/19 0412 07/29/19 0758  BP: 129/73   Pulse: 69   Resp: 16   Temp: 98.8 F (37.1 C)   SpO2: 96% 95%   Skin clean, dry and intact without evidence of skin break down, no evidence of skin tears noted. IV catheter discontinued intact. Site without signs and symptoms of complications -  no redness or edema noted at insertion site, patient denies c/o pain - only slight tenderness at site.  Dressing with slight pressure applied.  D/c Instructions-Education: Discharge instructions given to patient/family with verbalized understanding. D/c education completed with patient/family including follow up instructions, medication list, d/c activities limitations if indicated, with other d/c instructions as indicated by MD - patient able to verbalize understanding, all questions fully answered. Patient instructed to return to ED, call 911, or call MD for any changes in condition.  Patient escorted via [place], and D/C home via private auto.  Eda Keys, RN 07/29/2019 3:55 PM

## 2019-07-29 NOTE — Progress Notes (Signed)
Spoke with pt's daughter Verdene Lennert at [phone]. Explained how to pick up patient. States that she will bring clothes for pt. Denies further questions.

## 2019-07-30 ENCOUNTER — Telehealth: Payer: Self-pay

## 2019-07-30 NOTE — Telephone Encounter (Signed)
Transition Care Management Follow-up Telephone Call  Talked with patients wife.    Date of discharge and from where: 07/29/2019 [facility]  How have you been since you were released from the hospital? "doing good, he is resting"  Any questions or concerns? No   Items Reviewed:  Did the pt receive and understand the discharge instructions provided? Yes   Medications obtained and verified? Yes   Any new allergies since your discharge? No   Dietary orders reviewed? Yes  Do you have support at home? Yes   Functional Questionnaire: (I = Independent and D = Dependent) ADLs: patient has already heard from home health   Bathing/Dressing- d  Meal Prep- d  Eating- i  Maintaining continence- i  Transferring/Ambulation- rolling walker   Managing Meds- daughter helps  Follow up appointments reviewed:   PCP Hospital f/u appt confirmed? No  requested to call back and schedule   [facility] f/u appt confirmed?no   Are transportation arrangements needed? No   If their condition worsens, is the pt aware to call PCP or go to the Emergency Dept.? yes  Was the patient provided with contact information for the PCP's office or ED? Yes  Was to pt encouraged to call back with questions or concerns? yes

## 2019-07-31 LAB — CULTURE, BLOOD (ROUTINE X 2)
Culture: NO GROWTH
Culture: NO GROWTH

## 2019-11-20 DIAGNOSIS — N1831 Chronic kidney disease, stage 3a: Secondary | ICD-10-CM | POA: Insufficient documentation

## 2020-08-18 ENCOUNTER — Emergency Department: Payer: Medicare HMO

## 2020-08-18 ENCOUNTER — Inpatient Hospital Stay
Admission: EM | Admit: 2020-08-18 | Discharge: 2020-08-25 | DRG: 280 | Disposition: A | Discharge status: Other Acute Inpatient Hospital | Payer: Medicare HMO | Attending: Obstetrics and Gynecology | Admitting: Obstetrics and Gynecology

## 2020-08-18 DIAGNOSIS — N141 Nephropathy induced by other drugs, medicaments and biological substances: Secondary | ICD-10-CM | POA: Diagnosis not present

## 2020-08-18 DIAGNOSIS — Z01810 Encounter for preprocedural cardiovascular examination: Secondary | ICD-10-CM | POA: Diagnosis not present

## 2020-08-18 DIAGNOSIS — I5020 Unspecified systolic (congestive) heart failure: Secondary | ICD-10-CM

## 2020-08-18 DIAGNOSIS — R7401 Elevation of levels of liver transaminase levels: Secondary | ICD-10-CM | POA: Diagnosis present

## 2020-08-18 DIAGNOSIS — Z794 Long term (current) use of insulin: Secondary | ICD-10-CM

## 2020-08-18 DIAGNOSIS — I2510 Atherosclerotic heart disease of native coronary artery without angina pectoris: Secondary | ICD-10-CM | POA: Diagnosis not present

## 2020-08-18 DIAGNOSIS — Z8674 Personal history of sudden cardiac arrest: Secondary | ICD-10-CM

## 2020-08-18 DIAGNOSIS — I469 Cardiac arrest, cause unspecified: Secondary | ICD-10-CM | POA: Diagnosis present

## 2020-08-18 DIAGNOSIS — I444 Left anterior fascicular block: Secondary | ICD-10-CM | POA: Diagnosis present

## 2020-08-18 DIAGNOSIS — I5032 Chronic diastolic (congestive) heart failure: Secondary | ICD-10-CM | POA: Insufficient documentation

## 2020-08-18 DIAGNOSIS — Z4659 Encounter for fitting and adjustment of other gastrointestinal appliance and device: Secondary | ICD-10-CM

## 2020-08-18 DIAGNOSIS — I4901 Ventricular fibrillation: Secondary | ICD-10-CM

## 2020-08-18 DIAGNOSIS — I255 Ischemic cardiomyopathy: Secondary | ICD-10-CM | POA: Diagnosis present

## 2020-08-18 DIAGNOSIS — I462 Cardiac arrest due to underlying cardiac condition: Secondary | ICD-10-CM | POA: Diagnosis present

## 2020-08-18 DIAGNOSIS — Z79899 Other long term (current) drug therapy: Secondary | ICD-10-CM

## 2020-08-18 DIAGNOSIS — S2241XA Multiple fractures of ribs, right side, initial encounter for closed fracture: Secondary | ICD-10-CM | POA: Diagnosis present

## 2020-08-18 DIAGNOSIS — I25118 Atherosclerotic heart disease of native coronary artery with other forms of angina pectoris: Secondary | ICD-10-CM | POA: Diagnosis present

## 2020-08-18 DIAGNOSIS — R001 Bradycardia, unspecified: Secondary | ICD-10-CM

## 2020-08-18 DIAGNOSIS — I130 Hypertensive heart and chronic kidney disease with heart failure and stage 1 through stage 4 chronic kidney disease, or unspecified chronic kidney disease: Secondary | ICD-10-CM | POA: Diagnosis present

## 2020-08-18 DIAGNOSIS — Z888 Allergy status to other drugs, medicaments and biological substances status: Secondary | ICD-10-CM

## 2020-08-18 DIAGNOSIS — N183 Chronic kidney disease, stage 3 unspecified: Secondary | ICD-10-CM

## 2020-08-18 DIAGNOSIS — N179 Acute kidney failure, unspecified: Secondary | ICD-10-CM | POA: Diagnosis present

## 2020-08-18 DIAGNOSIS — Z87891 Personal history of nicotine dependence: Secondary | ICD-10-CM | POA: Diagnosis not present

## 2020-08-18 DIAGNOSIS — E669 Obesity, unspecified: Secondary | ICD-10-CM | POA: Diagnosis present

## 2020-08-18 DIAGNOSIS — J9601 Acute respiratory failure with hypoxia: Secondary | ICD-10-CM | POA: Diagnosis not present

## 2020-08-18 DIAGNOSIS — I5042 Chronic combined systolic (congestive) and diastolic (congestive) heart failure: Secondary | ICD-10-CM | POA: Diagnosis present

## 2020-08-18 DIAGNOSIS — I441 Atrioventricular block, second degree: Secondary | ICD-10-CM | POA: Diagnosis present

## 2020-08-18 DIAGNOSIS — I213 ST elevation (STEMI) myocardial infarction of unspecified site: Secondary | ICD-10-CM | POA: Diagnosis present

## 2020-08-18 DIAGNOSIS — Z20822 Contact with and (suspected) exposure to covid-19: Secondary | ICD-10-CM | POA: Diagnosis present

## 2020-08-18 DIAGNOSIS — Z7982 Long term (current) use of aspirin: Secondary | ICD-10-CM

## 2020-08-18 DIAGNOSIS — Z951 Presence of aortocoronary bypass graft: Secondary | ICD-10-CM | POA: Diagnosis not present

## 2020-08-18 DIAGNOSIS — Z8679 Personal history of other diseases of the circulatory system: Secondary | ICD-10-CM | POA: Diagnosis not present

## 2020-08-18 DIAGNOSIS — K5900 Constipation, unspecified: Secondary | ICD-10-CM | POA: Diagnosis not present

## 2020-08-18 DIAGNOSIS — S27321A Contusion of lung, unilateral, initial encounter: Secondary | ICD-10-CM | POA: Diagnosis present

## 2020-08-18 DIAGNOSIS — E785 Hyperlipidemia, unspecified: Secondary | ICD-10-CM | POA: Diagnosis present

## 2020-08-18 DIAGNOSIS — Z8249 Family history of ischemic heart disease and other diseases of the circulatory system: Secondary | ICD-10-CM

## 2020-08-18 DIAGNOSIS — E1159 Type 2 diabetes mellitus with other circulatory complications: Secondary | ICD-10-CM | POA: Diagnosis not present

## 2020-08-18 DIAGNOSIS — Z7902 Long term (current) use of antithrombotics/antiplatelets: Secondary | ICD-10-CM

## 2020-08-18 DIAGNOSIS — Z6835 Body mass index (BMI) 35.0-35.9, adult: Secondary | ICD-10-CM

## 2020-08-18 DIAGNOSIS — Z955 Presence of coronary angioplasty implant and graft: Secondary | ICD-10-CM | POA: Diagnosis not present

## 2020-08-18 DIAGNOSIS — R0602 Shortness of breath: Secondary | ICD-10-CM

## 2020-08-18 DIAGNOSIS — I428 Other cardiomyopathies: Secondary | ICD-10-CM | POA: Diagnosis present

## 2020-08-18 DIAGNOSIS — I5022 Chronic systolic (congestive) heart failure: Secondary | ICD-10-CM | POA: Diagnosis not present

## 2020-08-18 DIAGNOSIS — E1122 Type 2 diabetes mellitus with diabetic chronic kidney disease: Secondary | ICD-10-CM | POA: Diagnosis present

## 2020-08-18 DIAGNOSIS — N189 Chronic kidney disease, unspecified: Secondary | ICD-10-CM | POA: Diagnosis not present

## 2020-08-18 DIAGNOSIS — N1832 Chronic kidney disease, stage 3b: Secondary | ICD-10-CM | POA: Diagnosis present

## 2020-08-18 DIAGNOSIS — I214 Non-ST elevation (NSTEMI) myocardial infarction: Secondary | ICD-10-CM | POA: Diagnosis not present

## 2020-08-18 DIAGNOSIS — Z7951 Long term (current) use of inhaled steroids: Secondary | ICD-10-CM

## 2020-08-18 DIAGNOSIS — K2100 Gastro-esophageal reflux disease with esophagitis, without bleeding: Secondary | ICD-10-CM | POA: Diagnosis present

## 2020-08-18 DIAGNOSIS — T508X5A Adverse effect of diagnostic agents, initial encounter: Secondary | ICD-10-CM | POA: Diagnosis not present

## 2020-08-18 DIAGNOSIS — I10 Essential (primary) hypertension: Secondary | ICD-10-CM | POA: Diagnosis not present

## 2020-08-18 DIAGNOSIS — Y92410 Unspecified street and highway as the place of occurrence of the external cause: Secondary | ICD-10-CM | POA: Diagnosis not present

## 2020-08-18 DIAGNOSIS — E876 Hypokalemia: Secondary | ICD-10-CM | POA: Diagnosis present

## 2020-08-18 DIAGNOSIS — I5023 Acute on chronic systolic (congestive) heart failure: Secondary | ICD-10-CM | POA: Diagnosis not present

## 2020-08-18 LAB — GLUCOSE, CAPILLARY
Glucose-Capillary: 133 mg/dL — ABNORMAL HIGH (ref 70–99)
Glucose-Capillary: 243 mg/dL — ABNORMAL HIGH (ref 70–99)

## 2020-08-18 LAB — CBC WITH DIFFERENTIAL/PLATELET
Abs Immature Granulocytes: 0.1 10*3/uL — ABNORMAL HIGH (ref 0.00–0.07)
Basophils Absolute: 0 10*3/uL (ref 0.0–0.1)
Basophils Relative: 0 %
Eosinophils Absolute: 0.1 10*3/uL (ref 0.0–0.5)
Eosinophils Relative: 1 %
HCT: 38.1 % — ABNORMAL LOW (ref 39.0–52.0)
Hemoglobin: 12.2 g/dL — ABNORMAL LOW (ref 13.0–17.0)
Immature Granulocytes: 1 %
Lymphocytes Relative: 16 %
Lymphs Abs: 2 10*3/uL (ref 0.7–4.0)
MCH: 29.4 pg (ref 26.0–34.0)
MCHC: 32 g/dL (ref 30.0–36.0)
MCV: 91.8 fL (ref 80.0–100.0)
Monocytes Absolute: 0.6 10*3/uL (ref 0.1–1.0)
Monocytes Relative: 5 %
Neutro Abs: 9.4 10*3/uL — ABNORMAL HIGH (ref 1.7–7.7)
Neutrophils Relative %: 77 %
Platelets: 186 10*3/uL (ref 150–400)
RBC: 4.15 MIL/uL — ABNORMAL LOW (ref 4.22–5.81)
RDW: 13.3 % (ref 11.5–15.5)
WBC: 12.2 10*3/uL — ABNORMAL HIGH (ref 4.0–10.5)
nRBC: 0 % (ref 0.0–0.2)

## 2020-08-18 LAB — COMPREHENSIVE METABOLIC PANEL
ALT: 399 U/L — ABNORMAL HIGH (ref 0–44)
AST: 287 U/L — ABNORMAL HIGH (ref 15–41)
Albumin: 3.4 g/dL — ABNORMAL LOW (ref 3.5–5.0)
Alkaline Phosphatase: 75 U/L (ref 38–126)
Anion gap: 13 (ref 5–15)
BUN: 22 mg/dL (ref 8–23)
CO2: 20 mmol/L — ABNORMAL LOW (ref 22–32)
Calcium: 7.9 mg/dL — ABNORMAL LOW (ref 8.9–10.3)
Chloride: 108 mmol/L (ref 98–111)
Creatinine, Ser: 1.86 mg/dL — ABNORMAL HIGH (ref 0.61–1.24)
GFR, Estimated: 33 mL/min — ABNORMAL LOW (ref >60)
Glucose, Bld: 271 mg/dL — ABNORMAL HIGH (ref 70–99)
Potassium: 3.5 mmol/L (ref 3.5–5.1)
Sodium: 141 mmol/L (ref 135–145)
Total Bilirubin: 0.9 mg/dL (ref 0.3–1.2)
Total Protein: 6.5 g/dL (ref 6.5–8.1)

## 2020-08-18 LAB — BLOOD GAS, ARTERIAL
Acid-base deficit: 3.7 mmol/L — ABNORMAL HIGH (ref 0.0–2.0)
Bicarbonate: 20.6 mmol/L (ref 20.0–28.0)
FIO2: 0.5
MECHVT: 500 mL
O2 Saturation: 99.5 %
PEEP: 5 cmH2O
Patient temperature: 37
RATE: 20 resp/min
pCO2 arterial: 34 mmHg (ref 32.0–48.0)
pH, Arterial: 7.39 (ref 7.350–7.450)
pO2, Arterial: 166 mmHg — ABNORMAL HIGH (ref 83.0–108.0)

## 2020-08-18 LAB — PROTIME-INR
INR: 1.1 (ref 0.8–1.2)
Prothrombin Time: 13.5 seconds (ref 11.4–15.2)

## 2020-08-18 LAB — TROPONIN I (HIGH SENSITIVITY)
Troponin I (High Sensitivity): 559 ng/L (ref <18)
Troponin I (High Sensitivity): 706 ng/L (ref <18)

## 2020-08-18 LAB — TYPE AND SCREEN
ABO/RH(D): O POS
Antibody Screen: NEGATIVE

## 2020-08-18 LAB — APTT: aPTT: 30 seconds (ref 24–36)

## 2020-08-18 LAB — PROCALCITONIN: Procalcitonin: 0.14 ng/mL

## 2020-08-18 LAB — RESPIRATORY PANEL BY RT PCR (FLU A&B, COVID)
Influenza A by PCR: NEGATIVE
Influenza B by PCR: NEGATIVE
SARS Coronavirus 2 by RT PCR: NEGATIVE

## 2020-08-18 LAB — BRAIN NATRIURETIC PEPTIDE: B Natriuretic Peptide: 189.4 pg/mL — ABNORMAL HIGH (ref 0.0–100.0)

## 2020-08-18 LAB — MAGNESIUM: Magnesium: 1.9 mg/dL (ref 1.7–2.4)

## 2020-08-18 LAB — PHOSPHORUS: Phosphorus: 2.1 mg/dL — ABNORMAL LOW (ref 2.5–4.6)

## 2020-08-18 MED ORDER — POLYETHYLENE GLYCOL 3350 17 G PO PACK
17.0000 g | PACK | Freq: Every day | ORAL | Status: DC | PRN
  Filled 2020-08-18: qty 1

## 2020-08-18 MED ORDER — SUCCINYLCHOLINE CHLORIDE 20 MG/ML IJ SOLN
INTRAMUSCULAR | Status: AC | PRN
  Administered 2020-08-18: 100 mg via INTRAVENOUS

## 2020-08-18 MED ORDER — ASPIRIN EC 325 MG PO TBEC
325.0000 mg | DELAYED_RELEASE_TABLET | Freq: Every day | ORAL | Status: DC
  Filled 2020-08-18: qty 1

## 2020-08-18 MED ORDER — HEPARIN (PORCINE) 25000 UT/250ML-% IV SOLN
1900.0000 [IU]/h | INTRAVENOUS | Status: DC
  Administered 2020-08-18 – 2020-08-20 (×3): 1250 [IU]/h via INTRAVENOUS
  Administered 2020-08-21: 1400 [IU]/h via INTRAVENOUS
  Administered 2020-08-22: 1700 [IU]/h via INTRAVENOUS
  Administered 2020-08-22: 1600 [IU]/h via INTRAVENOUS
  Administered 2020-08-23 – 2020-08-25 (×4): 1900 [IU]/h via INTRAVENOUS
  Filled 2020-08-18 (×10): qty 250

## 2020-08-18 MED ORDER — MIDAZOLAM HCL 50 MG/10ML IJ SOLN
0.5000 mg/h | INTRAVENOUS | Status: DC
  Administered 2020-08-18: 0.5 mg/h via INTRAVENOUS
  Filled 2020-08-18: qty 10

## 2020-08-18 MED ORDER — POTASSIUM CHLORIDE 20 MEQ PO PACK
40.0000 meq | PACK | Freq: Once | ORAL | Status: AC
  Administered 2020-08-18: 40 meq
  Filled 2020-08-18: qty 2

## 2020-08-18 MED ORDER — CLOPIDOGREL BISULFATE 75 MG PO TABS
75.0000 mg | ORAL_TABLET | Freq: Every day | ORAL | Status: DC
  Administered 2020-08-19 – 2020-08-24 (×6): 75 mg
  Filled 2020-08-18 (×7): qty 1

## 2020-08-18 MED ORDER — MAGNESIUM SULFATE 2 GM/50ML IV SOLN
2.0000 g | Freq: Once | INTRAVENOUS | Status: AC
  Administered 2020-08-18: 2 g via INTRAVENOUS
  Filled 2020-08-18: qty 50

## 2020-08-18 MED ORDER — IOHEXOL 350 MG/ML SOLN
100.0000 mL | Freq: Once | INTRAVENOUS | Status: AC | PRN
  Administered 2020-08-18: 100 mL via INTRAVENOUS

## 2020-08-18 MED ORDER — LACTATED RINGERS IV SOLN: INTRAVENOUS | Status: DC

## 2020-08-18 MED ORDER — HEPARIN BOLUS VIA INFUSION
4000.0000 [IU] | Freq: Once | INTRAVENOUS | Status: AC
  Administered 2020-08-18: 4000 [IU] via INTRAVENOUS
  Filled 2020-08-18: qty 4000

## 2020-08-18 MED ORDER — ORAL CARE MOUTH RINSE
15.0000 mL | OROMUCOSAL | Status: DC
  Administered 2020-08-18 – 2020-08-19 (×5): 15 mL via OROMUCOSAL

## 2020-08-18 MED ORDER — FUROSEMIDE 10 MG/ML IJ SOLN
40.0000 mg | Freq: Once | INTRAMUSCULAR | Status: AC
  Administered 2020-08-19: 40 mg via INTRAVENOUS
  Filled 2020-08-18: qty 4

## 2020-08-18 MED ORDER — DOCUSATE SODIUM 50 MG/5ML PO LIQD
100.0000 mg | Freq: Two times a day (BID) | ORAL | Status: DC
  Filled 2020-08-18 (×2): qty 10

## 2020-08-18 MED ORDER — INSULIN ASPART 100 UNIT/ML ~~LOC~~ SOLN
0.0000 [IU] | SUBCUTANEOUS | Status: DC
  Administered 2020-08-18 – 2020-08-19 (×3): 2 [IU] via SUBCUTANEOUS
  Filled 2020-08-18 (×3): qty 1

## 2020-08-18 MED ORDER — MIDAZOLAM HCL 2 MG/2ML IJ SOLN: 1.0000 mg | INTRAMUSCULAR | Status: DC | PRN

## 2020-08-18 MED ORDER — FENTANYL 2500MCG IN NS 250ML (10MCG/ML) PREMIX INFUSION
0.0000 ug/h | INTRAVENOUS | Status: DC
  Administered 2020-08-18: 25 ug/h via INTRAVENOUS

## 2020-08-18 MED ORDER — PANTOPRAZOLE SODIUM 40 MG IV SOLR
40.0000 mg | Freq: Every day | INTRAVENOUS | Status: DC
  Administered 2020-08-18: 40 mg via INTRAVENOUS
  Filled 2020-08-18: qty 40

## 2020-08-18 MED ORDER — FENTANYL CITRATE (PF) 100 MCG/2ML IJ SOLN
INTRAMUSCULAR | Status: AC | PRN
  Administered 2020-08-18: 100 ug via INTRAVENOUS

## 2020-08-18 MED ORDER — ETOMIDATE 2 MG/ML IV SOLN
INTRAVENOUS | Status: AC | PRN
  Administered 2020-08-18: 20 mg via INTRAVENOUS

## 2020-08-18 MED ORDER — CHLORHEXIDINE GLUCONATE CLOTH 2 % EX PADS
6.0000 | MEDICATED_PAD | Freq: Every day | CUTANEOUS | Status: DC
  Administered 2020-08-19 – 2020-08-24 (×4): 6 via TOPICAL

## 2020-08-18 MED ORDER — SODIUM CHLORIDE 0.9 % IV SOLN
3.0000 g | Freq: Four times a day (QID) | INTRAVENOUS | Status: DC
  Administered 2020-08-19: 3 g via INTRAVENOUS
  Filled 2020-08-18 (×4): qty 8

## 2020-08-18 MED ORDER — FENTANYL 2500MCG IN NS 250ML (10MCG/ML) PREMIX INFUSION
25.0000 ug/h | INTRAVENOUS | Status: DC
  Administered 2020-08-18: 100 ug/h via INTRAVENOUS

## 2020-08-18 MED ORDER — SODIUM CHLORIDE 0.9 % IV SOLN
0.5000 mg/h | INTRAVENOUS | Status: DC
  Filled 2020-08-18: qty 10

## 2020-08-18 MED ORDER — SODIUM CHLORIDE 0.9 % IV SOLN
3.0000 g | Freq: Once | INTRAVENOUS | Status: AC
  Administered 2020-08-18: 3 g via INTRAVENOUS

## 2020-08-18 MED ORDER — POTASSIUM CHLORIDE 20 MEQ PO PACK
20.0000 meq | PACK | Freq: Once | ORAL | Status: AC
  Administered 2020-08-18: 20 meq
  Filled 2020-08-18: qty 1

## 2020-08-18 MED ORDER — ONDANSETRON HCL 4 MG/2ML IJ SOLN
4.0000 mg | Freq: Four times a day (QID) | INTRAMUSCULAR | Status: DC | PRN
  Administered 2020-08-19 – 2020-08-23 (×2): 4 mg via INTRAVENOUS
  Filled 2020-08-18 (×2): qty 2

## 2020-08-18 MED ORDER — MIDAZOLAM HCL 5 MG/5ML IJ SOLN
INTRAMUSCULAR | Status: AC | PRN
  Administered 2020-08-18: 2 mg via INTRAVENOUS

## 2020-08-18 MED ORDER — DOCUSATE SODIUM 100 MG PO CAPS: 100.0000 mg | ORAL_CAPSULE | Freq: Two times a day (BID) | ORAL | Status: DC | PRN

## 2020-08-18 MED ORDER — FUROSEMIDE 10 MG/ML IJ SOLN
40.0000 mg | Freq: Once | INTRAMUSCULAR | Status: AC
  Administered 2020-08-18: 40 mg via INTRAVENOUS
  Filled 2020-08-18: qty 4

## 2020-08-18 MED ORDER — CHLORHEXIDINE GLUCONATE 0.12% ORAL RINSE (MEDLINE KIT)
15.0000 mL | Freq: Two times a day (BID) | OROMUCOSAL | Status: DC
  Administered 2020-08-18 – 2020-08-19 (×2): 15 mL via OROMUCOSAL

## 2020-08-18 MED ORDER — FENTANYL CITRATE (PF) 100 MCG/2ML IJ SOLN: 25.0000 ug | Freq: Once | INTRAMUSCULAR | Status: DC

## 2020-08-18 MED ORDER — FENTANYL BOLUS VIA INFUSION
25.0000 ug | INTRAVENOUS | Status: DC | PRN
  Administered 2020-08-18: 25 ug via INTRAVENOUS
  Filled 2020-08-18: qty 25

## 2020-08-18 MED ORDER — ACETAMINOPHEN 325 MG PO TABS: 650.0000 mg | ORAL_TABLET | ORAL | Status: DC | PRN

## 2020-08-18 NOTE — Consult Note (Signed)
Pharmacy Antibiotic Note  Khaiden Segreto is a 85 y.o. male admitted on 08/18/2020 with aspiration pneumonia.  Pharmacy has been consulted for Unasyn dosing.  Per chart review (encounter on 08/15/2020): Ht 68 in Wt 104.3 kg  Adjusted Wt 82.8 kg  CrCl (using adjusted Body weight): 35.9 mL/min  Plan: Unasyn 3g Q6H     Temp (24hrs), Avg:95.6 F (35.3 C), Min:95.1 F (35.1 C), Max:96.3 F (35.7 C)  Recent Labs  Lab 08/18/20 1756  WBC 12.2*  CREATININE 1.86*    CrCl cannot be calculated (Unknown ideal weight.).    Allergies  Allergen Reactions  . Metformin Diarrhea    Antimicrobials this admission:   Dose adjustments this admission:   Microbiology results:   Thank you for allowing pharmacy to be a part of this patient's care.  Rowland Lathe 08/18/2020 9:27 PM
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
## 2020-08-18 NOTE — ED Notes (Signed)
Pt's family who wants to be contacted with updated or questions regarding patient care: Enid Derry [phone] [place] [phone]. Eliezer Champagne [phone] Verdene Lennert [phone] [address] [phone]
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
## 2020-08-18 NOTE — Consult Note (Addendum)
ANTICOAGULATION CONSULT NOTE - Initial Consult  Pharmacy Consult for Heparin  Indication: ACS / STEMI  Allergies  Allergen Reactions  . Metformin Diarrhea    Patient Measurements:   Heparin Dosing Weight: 104.3 kg (per Care Everywhere documented surgery note on 08/15/2020)  Vital Signs: Temp: 95.2 F (35.1 C) (10/11 1930) BP: 120/80 (10/11 1930) Pulse Rate: 54 (10/11 1930)  Labs: Recent Labs    08/18/20 1756  HGB 12.2*  HCT 38.1*  PLT 186  CREATININE 1.86*  TROPONINIHS 559*    CrCl cannot be calculated (Unknown ideal weight.).   Medical History: Past Medical History:  Diagnosis Date  . Diabetes mellitus without complication ([place])   . Hypertension     Medications:  No PTA anticoagulation - per chart review and verified with ED nurse.   Assessment: Pharmacy has been consulted to monitor heparin dosing in a patient who presented to the ED as post cardiac arrest. Per notes, EMS found patient pulseless and in V-fib. Per ED nurse, patient is intubated. As such, patient's weight was obtained from chart review of recent weight documented for a surgery. ED nurse will follow up with patient.   Baseline CBC appropriate.   Troponin 559 >>   Goal of Therapy:  Heparin level 0.3-0.7 units/ml Monitor platelets by anticoagulation protocol: Yes   Plan:  Baseline labs have been ordered  Heparin DW: 104.3 kg  Give 4000 units bolus x 1 Start heparin infusion at 1250 units/hr Check anti-Xa level in 8 hours (at ~0430 tomorrow) andCBC  daily while on heparin, per protocol Continue to monitor H&H and platelets  Update @ 2016: Per ED RN, patient's family guess/estimated weight to be ~230 lbs which is the same weight that was documented from recent surgery.  Rowland Lathe 08/18/2020,7:53 PM
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
## 2020-08-19 ENCOUNTER — Inpatient Hospital Stay: Payer: Medicare HMO

## 2020-08-19 ENCOUNTER — Inpatient Hospital Stay (HOSPITAL_COMMUNITY)
Admit: 2020-08-19 | Discharge: 2020-08-19 | Disposition: A | Discharge status: Home / Self Care | Payer: Medicare HMO | Attending: Acute Care | Admitting: Acute Care

## 2020-08-19 DIAGNOSIS — I214 Non-ST elevation (NSTEMI) myocardial infarction: Secondary | ICD-10-CM

## 2020-08-19 DIAGNOSIS — I469 Cardiac arrest, cause unspecified: Secondary | ICD-10-CM

## 2020-08-19 DIAGNOSIS — I4901 Ventricular fibrillation: Secondary | ICD-10-CM

## 2020-08-19 DIAGNOSIS — N179 Acute kidney failure, unspecified: Secondary | ICD-10-CM | POA: Diagnosis not present

## 2020-08-19 DIAGNOSIS — N189 Chronic kidney disease, unspecified: Secondary | ICD-10-CM

## 2020-08-19 LAB — BASIC METABOLIC PANEL
Anion gap: 11 (ref 5–15)
Anion gap: 13 (ref 5–15)
BUN: 23 mg/dL (ref 8–23)
BUN: 20 mg/dL (ref 8–23)
CO2: 23 mmol/L (ref 22–32)
CO2: 25 mmol/L (ref 22–32)
Calcium: 8 mg/dL — ABNORMAL LOW (ref 8.9–10.3)
Calcium: 8.1 mg/dL — ABNORMAL LOW (ref 8.9–10.3)
Chloride: 109 mmol/L (ref 98–111)
Chloride: 107 mmol/L (ref 98–111)
Creatinine, Ser: 1.71 mg/dL — ABNORMAL HIGH (ref 0.61–1.24)
Creatinine, Ser: 1.57 mg/dL — ABNORMAL HIGH (ref 0.61–1.24)
GFR, Estimated: 36 mL/min — ABNORMAL LOW (ref >60)
GFR, Estimated: 40 mL/min — ABNORMAL LOW (ref >60)
Glucose, Bld: 132 mg/dL — ABNORMAL HIGH (ref 70–99)
Glucose, Bld: 129 mg/dL — ABNORMAL HIGH (ref 70–99)
Potassium: 3.3 mmol/L — ABNORMAL LOW (ref 3.5–5.1)
Potassium: 3.5 mmol/L (ref 3.5–5.1)
Sodium: 143 mmol/L (ref 135–145)
Sodium: 145 mmol/L (ref 135–145)

## 2020-08-19 LAB — ECHOCARDIOGRAM COMPLETE
AR max vel: 1.8 cm2
AV Area VTI: 2.12 cm2
AV Area mean vel: 2.04 cm2
AV Mean grad: 4 mmHg
AV Peak grad: 8.8 mmHg
Ao pk vel: 1.48 m/s
Area-P 1/2: 5.02 cm2
Height: 68 in
S' Lateral: 3.53 cm
Weight: 3739 oz

## 2020-08-19 LAB — CBC
HCT: 37.6 % — ABNORMAL LOW (ref 39.0–52.0)
Hemoglobin: 12.6 g/dL — ABNORMAL LOW (ref 13.0–17.0)
MCH: 29.6 pg (ref 26.0–34.0)
MCHC: 33.5 g/dL (ref 30.0–36.0)
MCV: 88.5 fL (ref 80.0–100.0)
Platelets: 165 10*3/uL (ref 150–400)
RBC: 4.25 MIL/uL (ref 4.22–5.81)
RDW: 13.3 % (ref 11.5–15.5)
WBC: 7.1 10*3/uL (ref 4.0–10.5)
nRBC: 0 % (ref 0.0–0.2)

## 2020-08-19 LAB — GLUCOSE, CAPILLARY
Glucose-Capillary: 125 mg/dL — ABNORMAL HIGH (ref 70–99)
Glucose-Capillary: 108 mg/dL — ABNORMAL HIGH (ref 70–99)
Glucose-Capillary: 123 mg/dL — ABNORMAL HIGH (ref 70–99)
Glucose-Capillary: 134 mg/dL — ABNORMAL HIGH (ref 70–99)
Glucose-Capillary: 110 mg/dL — ABNORMAL HIGH (ref 70–99)
Glucose-Capillary: 105 mg/dL — ABNORMAL HIGH (ref 70–99)

## 2020-08-19 LAB — HEPARIN LEVEL (UNFRACTIONATED)
Heparin Unfractionated: 0.59 IU/mL (ref 0.30–0.70)
Heparin Unfractionated: 0.47 IU/mL (ref 0.30–0.70)

## 2020-08-19 LAB — MAGNESIUM: Magnesium: 2.4 mg/dL (ref 1.7–2.4)

## 2020-08-19 LAB — MRSA PCR SCREENING: MRSA by PCR: NEGATIVE

## 2020-08-19 LAB — HEMOGLOBIN A1C
Hgb A1c MFr Bld: 7.8 % — ABNORMAL HIGH (ref 4.8–5.6)
Mean Plasma Glucose: 177.16 mg/dL

## 2020-08-19 LAB — TROPONIN I (HIGH SENSITIVITY)
Troponin I (High Sensitivity): 4808 ng/L (ref <18)
Troponin I (High Sensitivity): 3890 ng/L (ref <18)

## 2020-08-19 LAB — PHOSPHORUS: Phosphorus: 2.1 mg/dL — ABNORMAL LOW (ref 2.5–4.6)

## 2020-08-19 MED ORDER — ORAL CARE MOUTH RINSE: 15.0000 mL | Freq: Two times a day (BID) | OROMUCOSAL | Status: DC

## 2020-08-19 MED ORDER — INSULIN ASPART 100 UNIT/ML ~~LOC~~ SOLN
0.0000 [IU] | Freq: Three times a day (TID) | SUBCUTANEOUS | Status: DC
  Administered 2020-08-20: 2 [IU] via SUBCUTANEOUS
  Administered 2020-08-20: 3 [IU] via SUBCUTANEOUS
  Administered 2020-08-20: 2 [IU] via SUBCUTANEOUS
  Administered 2020-08-21 (×2): 3 [IU] via SUBCUTANEOUS
  Administered 2020-08-22 – 2020-08-23 (×4): 2 [IU] via SUBCUTANEOUS
  Administered 2020-08-23: 3 [IU] via SUBCUTANEOUS
  Administered 2020-08-23: 2 [IU] via SUBCUTANEOUS
  Administered 2020-08-24: 3 [IU] via SUBCUTANEOUS
  Filled 2020-08-19 (×12): qty 1

## 2020-08-19 MED ORDER — DOCUSATE SODIUM 100 MG PO CAPS
100.0000 mg | ORAL_CAPSULE | Freq: Two times a day (BID) | ORAL | Status: DC
  Administered 2020-08-19 – 2020-08-24 (×11): 100 mg via ORAL
  Filled 2020-08-19 (×12): qty 1

## 2020-08-19 MED ORDER — POTASSIUM CHLORIDE 20 MEQ/15ML (10%) PO SOLN
40.0000 meq | Freq: Two times a day (BID) | ORAL | Status: AC
  Administered 2020-08-19 (×2): 40 meq
  Filled 2020-08-19 (×2): qty 30

## 2020-08-19 MED ORDER — CHLORHEXIDINE GLUCONATE 0.12 % MT SOLN
15.0000 mL | Freq: Two times a day (BID) | OROMUCOSAL | Status: DC
  Administered 2020-08-19 – 2020-08-24 (×11): 15 mL via OROMUCOSAL
  Filled 2020-08-19 (×11): qty 15

## 2020-08-19 MED ORDER — SUCRALFATE 1 GM/10ML PO SUSP
1.0000 g | Freq: Three times a day (TID) | ORAL | Status: DC
  Administered 2020-08-19 – 2020-08-25 (×22): 1 g via ORAL
  Filled 2020-08-19 (×27): qty 10

## 2020-08-19 MED ORDER — OXYCODONE HCL 5 MG PO TABS
5.0000 mg | ORAL_TABLET | Freq: Four times a day (QID) | ORAL | Status: DC | PRN
  Administered 2020-08-20: 5 mg via ORAL
  Filled 2020-08-19: qty 1

## 2020-08-19 MED ORDER — ASPIRIN EC 81 MG PO TBEC
81.0000 mg | DELAYED_RELEASE_TABLET | Freq: Every day | ORAL | Status: DC
  Administered 2020-08-20 – 2020-08-24 (×5): 81 mg via ORAL
  Filled 2020-08-19 (×5): qty 1

## 2020-08-19 MED ORDER — OXYCODONE HCL 5 MG PO TABS
5.0000 mg | ORAL_TABLET | Freq: Four times a day (QID) | ORAL | Status: DC | PRN
  Administered 2020-08-19 (×2): 5 mg
  Filled 2020-08-19 (×2): qty 1

## 2020-08-19 MED ORDER — ASPIRIN 325 MG PO TABS
325.0000 mg | ORAL_TABLET | Freq: Every day | ORAL | Status: DC
  Administered 2020-08-19: 325 mg
  Filled 2020-08-19: qty 1

## 2020-08-19 MED ORDER — PANTOPRAZOLE SODIUM 40 MG IV SOLR
40.0000 mg | Freq: Two times a day (BID) | INTRAVENOUS | Status: DC
  Administered 2020-08-19 – 2020-08-23 (×7): 40 mg via INTRAVENOUS
  Filled 2020-08-19 (×8): qty 40

## 2020-08-19 MED ORDER — PERFLUTREN LIPID MICROSPHERE
1.0000 mL | INTRAVENOUS | Status: AC | PRN
  Administered 2020-08-19: 3 mL via INTRAVENOUS
  Filled 2020-08-19: qty 10

## 2020-08-19 MED ORDER — INSULIN ASPART 100 UNIT/ML ~~LOC~~ SOLN: 0.0000 [IU] | Freq: Every day | SUBCUTANEOUS | Status: DC
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
## 2020-08-19 NOTE — Progress Notes (Signed)
Discussed Code status bedside with patient in the presence of his wife.  We discussed his cardiac arrest and time on the ventilator.  When asked if he would want to have life saving measures should his heart stop or should he stop breathing he affirmed that he would like to remain a FULL code at this time.  [name], AGACNP-BC [facility]    Please see Amion for pager details.

## 2020-08-19 NOTE — Consult Note (Signed)
ANTICOAGULATION CONSULT NOTE -  Pharmacy Consult for Heparin  Indication: ACS / STEMI  Allergies  Allergen Reactions  . Metformin Diarrhea    Patient Measurements: Height: 5\' 8"  (172.7 cm) Weight: 106 kg (233 lb 11 oz) IBW/kg (Calculated) : 68.4 Heparin Dosing Weight: 104.3 kg (per Care Everywhere documented surgery note on 08/15/2020)  Vital Signs: Temp: 98.1 F (36.7 C) (10/12 0430) Temp Source: Bladder (10/11 2220) BP: 121/98 (10/12 0430) Pulse Rate: 70 (10/12 0430)  Labs: Recent Labs    08/18/20 1756 08/18/20 2003 08/19/20 0417  HGB 12.2*  --  12.6*  HCT 38.1*  --  37.6*  PLT 186  --  165  APTT  --  30  --   LABPROT  --  13.5  --   INR  --  1.1  --   HEPARINUNFRC  --   --  0.59  CREATININE 1.86*  --  1.71*  TROPONINIHS 559* 706*  --     Estimated Creatinine Clearance: 39.3 mL/min (A) (by C-G formula based on SCr of 1.71 mg/dL (H)).   Medical History: Past Medical History:  Diagnosis Date  . Diabetes mellitus without complication ([place])   . Hypertension     Medications:  No PTA anticoagulation - per chart review and verified with ED nurse.   Assessment: Pharmacy has been consulted to monitor heparin dosing in a patient who presented to the ED as post cardiac arrest. Per notes, EMS found patient pulseless and in V-fib. Per ED nurse, patient is intubated. As such, patient's weight was obtained from chart review of recent weight documented for a surgery. ED nurse will follow up with patient.   Baseline CBC appropriate.   Troponin 559 >>   Goal of Therapy:  Heparin level 0.3-0.7 units/ml Monitor platelets by anticoagulation protocol: Yes   Plan:  Baseline labs have been ordered  Heparin DW: 104.3 kg  Give 4000 units bolus x 1 Start heparin infusion at 1250 units/hr Check anti-Xa level in 8 hours (at ~0430 tomorrow) andCBC  daily while on heparin, per protocol Continue to monitor H&H and platelets  Update @ 2016: Per ED RN, patient's family  guess/estimated weight to be ~230 lbs which is the same weight that was documented from recent surgery.  1012 @ 0417 HL 0.59, therapeutic x 1.  CBC stable.  Will continue Heparin at current rate and recheck HL in 8 hours to confirm.  [place] Crane, Asaf Elmquist A 08/19/2020,5:16 AM
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
## 2020-08-19 NOTE — Progress Notes (Signed)
*  PRELIMINARY RESULTS* Echocardiogram 2D Echocardiogram has been performed.  [name] 08/19/2020, 9:21 AM

## 2020-08-19 NOTE — Plan of Care (Signed)
Patient is extubated to 4 lpm nasal canula

## 2020-08-19 NOTE — Progress Notes (Signed)
Patient Alert and oriented, able to follow commands, tolerating SBT and WUA. Patient extubated to nasal cannula with RT and care RN.  Patient sitting up in bed comfortable, no complaints at this time.  [name] Pulse [place], AGACNP-BC [facility]    Please see Amion for pager details.
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
## 2020-08-19 NOTE — Sedation Documentation (Signed)
Fentanyl drip order discontinued since pt was successfully extubated in the morning.  Drip was stopped by day shift RN at 0729 but bag remained hanging in pt's room until after shift change.  Fentanyl drip bag taken down by this RN at 2300 and wasted 900 mcg in 90 ml, witnessed by Corine Shelter, RN.   Unable to waste medication in pyxis due to drip not being found under pt's profile.  Discussed situation with pharmacy, who stated to document the waste within the [facility] and notes.

## 2020-08-20 ENCOUNTER — Inpatient Hospital Stay: Payer: Medicare HMO

## 2020-08-20 DIAGNOSIS — I4901 Ventricular fibrillation: Secondary | ICD-10-CM | POA: Diagnosis not present

## 2020-08-20 DIAGNOSIS — I5020 Unspecified systolic (congestive) heart failure: Secondary | ICD-10-CM

## 2020-08-20 DIAGNOSIS — I469 Cardiac arrest, cause unspecified: Secondary | ICD-10-CM | POA: Diagnosis not present

## 2020-08-20 LAB — HEPATIC FUNCTION PANEL
ALT: 231 U/L — ABNORMAL HIGH (ref 0–44)
AST: 198 U/L — ABNORMAL HIGH (ref 15–41)
Albumin: 3 g/dL — ABNORMAL LOW (ref 3.5–5.0)
Alkaline Phosphatase: 54 U/L (ref 38–126)
Bilirubin, Direct: 0.2 mg/dL (ref 0.0–0.2)
Indirect Bilirubin: 1 mg/dL — ABNORMAL HIGH (ref 0.3–0.9)
Total Bilirubin: 1.2 mg/dL (ref 0.3–1.2)
Total Protein: 6 g/dL — ABNORMAL LOW (ref 6.5–8.1)

## 2020-08-20 LAB — CBC
HCT: 37.9 % — ABNORMAL LOW (ref 39.0–52.0)
Hemoglobin: 12.1 g/dL — ABNORMAL LOW (ref 13.0–17.0)
MCH: 29.6 pg (ref 26.0–34.0)
MCHC: 31.9 g/dL (ref 30.0–36.0)
MCV: 92.7 fL (ref 80.0–100.0)
Platelets: 146 10*3/uL — ABNORMAL LOW (ref 150–400)
RBC: 4.09 MIL/uL — ABNORMAL LOW (ref 4.22–5.81)
RDW: 13.6 % (ref 11.5–15.5)
WBC: 7.7 10*3/uL (ref 4.0–10.5)
nRBC: 0 % (ref 0.0–0.2)

## 2020-08-20 LAB — BASIC METABOLIC PANEL
Anion gap: 8 (ref 5–15)
BUN: 21 mg/dL (ref 8–23)
CO2: 24 mmol/L (ref 22–32)
Calcium: 7.7 mg/dL — ABNORMAL LOW (ref 8.9–10.3)
Chloride: 108 mmol/L (ref 98–111)
Creatinine, Ser: 1.69 mg/dL — ABNORMAL HIGH (ref 0.61–1.24)
GFR, Estimated: 37 mL/min — ABNORMAL LOW (ref >60)
Glucose, Bld: 141 mg/dL — ABNORMAL HIGH (ref 70–99)
Potassium: 4.1 mmol/L (ref 3.5–5.1)
Sodium: 140 mmol/L (ref 135–145)

## 2020-08-20 LAB — GLUCOSE, CAPILLARY
Glucose-Capillary: 142 mg/dL — ABNORMAL HIGH (ref 70–99)
Glucose-Capillary: 184 mg/dL — ABNORMAL HIGH (ref 70–99)
Glucose-Capillary: 147 mg/dL — ABNORMAL HIGH (ref 70–99)
Glucose-Capillary: 123 mg/dL — ABNORMAL HIGH (ref 70–99)

## 2020-08-20 LAB — PHOSPHORUS: Phosphorus: 2.4 mg/dL — ABNORMAL LOW (ref 2.5–4.6)

## 2020-08-20 LAB — HEPARIN LEVEL (UNFRACTIONATED): Heparin Unfractionated: 0.4 IU/mL (ref 0.30–0.70)

## 2020-08-20 LAB — CALCIUM, IONIZED: Calcium, Ionized, Serum: 4.4 mg/dL — ABNORMAL LOW (ref 4.5–5.6)

## 2020-08-20 LAB — MAGNESIUM: Magnesium: 2 mg/dL (ref 1.7–2.4)

## 2020-08-20 MED ORDER — OXYCODONE HCL 5 MG PO TABS
5.0000 mg | ORAL_TABLET | ORAL | Status: DC | PRN
  Administered 2020-08-20 – 2020-08-21 (×2): 5 mg via ORAL
  Filled 2020-08-20 (×2): qty 1

## 2020-08-20 MED ORDER — ROSUVASTATIN CALCIUM 10 MG PO TABS
40.0000 mg | ORAL_TABLET | Freq: Every day | ORAL | Status: DC
  Administered 2020-08-20 – 2020-08-25 (×6): 40 mg via ORAL
  Filled 2020-08-20 (×6): qty 4
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
## 2020-08-20 NOTE — Consult Note (Signed)
[facility] for Heparin  Indication: ACS / STEMI  Allergies  Allergen Reactions  . Metformin Diarrhea    Patient Measurements: Height: 5\' 8"  (172.7 cm) Weight: 106 kg (233 lb 11 oz) IBW/kg (Calculated) : 68.4 Heparin Dosing Weight: 104.3 kg (per Care Everywhere documented surgery note on 08/15/2020)  Vital Signs: Temp: 98.5 F (36.9 C) (10/13 0400) Temp Source: Oral (10/13 0400) BP: 115/76 (10/13 0400) Pulse Rate: 96 (10/13 0400)  Labs: Recent Labs    08/18/20 1756 08/18/20 1756 08/18/20 2003 08/19/20 0417 08/19/20 1020 08/19/20 1149 08/19/20 1541 08/20/20 0354  HGB 12.2*   < >  --  12.6*  --   --   --  12.1*  HCT 38.1*  --   --  37.6*  --   --   --  37.9*  PLT 186  --   --  165  --   --   --  146*  APTT  --   --  30  --   --   --   --   --   LABPROT  --   --  13.5  --   --   --   --   --   INR  --   --  1.1  --   --   --   --   --   HEPARINUNFRC  --   --   --  0.59  --  0.47  --  0.40  CREATININE 1.86*   < >  --  1.71*  --   --  1.57* 1.69*  TROPONINIHS 559*   < > 706*  --  4,808* 3,890*  --   --    < > = values in this interval not displayed.    Estimated Creatinine Clearance: 39.8 mL/min (A) (by C-G formula based on SCr of 1.69 mg/dL (H)).   Medical History: Past Medical History:  Diagnosis Date  . Diabetes mellitus without complication ([place])   . Hypertension     Medications:  No PTA anticoagulation - per chart review and verified with ED nurse.   Assessment: Pharmacy has been consulted to monitor heparin dosing in a patient who presented to the ED as post cardiac arrest. Per notes, EMS found patient pulseless and in V-fib. Per ED nurse, patient is intubated. As such, patient's weight was obtained from chart review of recent weight documented for a surgery. ED nurse will follow up with patient.   Baseline CBC appropriate.    Goal of Therapy:  Heparin level 0.3-0.7 units/ml Monitor platelets by anticoagulation  protocol: Yes   Plan:  10/13 0354 HL 0.40, therapeutic x 3.  H/H stable, PLTs worse.  Continue Heparin at 1250 units/hr. HL and CBC with morning labs.  Plan for cardiac cath this admission. Will follow up plan for heparin following cath.  Ena Dawley, PharmD 08/20/2020,5:29 AM
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
## 2020-08-21 ENCOUNTER — Other Ambulatory Visit: Payer: Self-pay

## 2020-08-21 ENCOUNTER — Encounter: Payer: Self-pay | Admitting: Obstetrics and Gynecology

## 2020-08-21 DIAGNOSIS — Z8674 Personal history of sudden cardiac arrest: Secondary | ICD-10-CM

## 2020-08-21 DIAGNOSIS — I25118 Atherosclerotic heart disease of native coronary artery with other forms of angina pectoris: Secondary | ICD-10-CM | POA: Diagnosis not present

## 2020-08-21 DIAGNOSIS — I469 Cardiac arrest, cause unspecified: Secondary | ICD-10-CM | POA: Diagnosis not present

## 2020-08-21 DIAGNOSIS — I4901 Ventricular fibrillation: Secondary | ICD-10-CM | POA: Diagnosis not present

## 2020-08-21 DIAGNOSIS — I214 Non-ST elevation (NSTEMI) myocardial infarction: Secondary | ICD-10-CM

## 2020-08-21 DIAGNOSIS — I5020 Unspecified systolic (congestive) heart failure: Secondary | ICD-10-CM | POA: Diagnosis not present

## 2020-08-21 HISTORY — DX: Non-ST elevation (NSTEMI) myocardial infarction: I21.4

## 2020-08-21 LAB — BASIC METABOLIC PANEL WITH GFR
Anion gap: 9 (ref 5–15)
BUN: 29 mg/dL — ABNORMAL HIGH (ref 8–23)
CO2: 26 mmol/L (ref 22–32)
Calcium: 7.7 mg/dL — ABNORMAL LOW (ref 8.9–10.3)
Chloride: 103 mmol/L (ref 98–111)
Creatinine, Ser: 2 mg/dL — ABNORMAL HIGH (ref 0.61–1.24)
GFR, Estimated: 30 mL/min — ABNORMAL LOW
Glucose, Bld: 162 mg/dL — ABNORMAL HIGH (ref 70–99)
Potassium: 3.9 mmol/L (ref 3.5–5.1)
Sodium: 138 mmol/L (ref 135–145)

## 2020-08-21 LAB — HEPATITIS PANEL, ACUTE
HCV Ab: NONREACTIVE
Hep A IgM: NONREACTIVE
Hep B C IgM: NONREACTIVE
Hepatitis B Surface Ag: NONREACTIVE

## 2020-08-21 LAB — CBC
HCT: 35.2 % — ABNORMAL LOW (ref 39.0–52.0)
Hemoglobin: 11.2 g/dL — ABNORMAL LOW (ref 13.0–17.0)
MCH: 29.6 pg (ref 26.0–34.0)
MCHC: 31.8 g/dL (ref 30.0–36.0)
MCV: 92.9 fL (ref 80.0–100.0)
Platelets: 123 10*3/uL — ABNORMAL LOW (ref 150–400)
RBC: 3.79 MIL/uL — ABNORMAL LOW (ref 4.22–5.81)
RDW: 13.5 % (ref 11.5–15.5)
WBC: 6.8 10*3/uL (ref 4.0–10.5)
nRBC: 0 % (ref 0.0–0.2)

## 2020-08-21 LAB — GLUCOSE, CAPILLARY
Glucose-Capillary: 112 mg/dL — ABNORMAL HIGH (ref 70–99)
Glucose-Capillary: 179 mg/dL — ABNORMAL HIGH (ref 70–99)
Glucose-Capillary: 160 mg/dL — ABNORMAL HIGH (ref 70–99)
Glucose-Capillary: 166 mg/dL — ABNORMAL HIGH (ref 70–99)
Glucose-Capillary: 134 mg/dL — ABNORMAL HIGH (ref 70–99)

## 2020-08-21 LAB — HEPARIN LEVEL (UNFRACTIONATED)
Heparin Unfractionated: 0.18 IU/mL — ABNORMAL LOW (ref 0.30–0.70)
Heparin Unfractionated: 0.3 [IU]/mL (ref 0.30–0.70)

## 2020-08-21 LAB — MAGNESIUM: Magnesium: 2 mg/dL (ref 1.7–2.4)

## 2020-08-21 LAB — PHOSPHORUS: Phosphorus: 2.5 mg/dL (ref 2.5–4.6)

## 2020-08-21 MED ORDER — BUDESONIDE 0.5 MG/2ML IN SUSP
1.0000 mg | Freq: Two times a day (BID) | RESPIRATORY_TRACT | Status: DC
  Administered 2020-08-21 – 2020-08-24 (×5): 1 mg via RESPIRATORY_TRACT
  Filled 2020-08-21 (×8): qty 4

## 2020-08-21 MED ORDER — INFLUENZA VAC A&B SA ADJ QUAD 0.5 ML IM PRSY
0.5000 mL | PREFILLED_SYRINGE | INTRAMUSCULAR | Status: DC | PRN
  Filled 2020-08-21: qty 0.5
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
## 2020-08-21 NOTE — Plan of Care (Signed)
  Problem: Education: Goal: Knowledge of General Education information will improve Description: Including pain rating scale, medication(s)/side effects and non-pharmacologic comfort measures Outcome: Progressing   Problem: Health Behavior/Discharge Planning: Goal: Ability to manage health-related needs will improve Outcome: Progressing   Problem: Clinical Measurements: Goal: Ability to maintain clinical measurements within normal limits will improve Outcome: Progressing Goal: Will remain free from infection Outcome: Progressing Goal: Diagnostic test results will improve Outcome: Progressing Goal: Respiratory complications will improve Outcome: Progressing Goal: Cardiovascular complication will be avoided Outcome: Progressing   Problem: Activity: Goal: Risk for activity intolerance will decrease Outcome: Progressing   Problem: Pain Managment: Goal: General experience of comfort will improve Outcome: Progressing   Problem: Safety: Goal: Ability to remain free from injury will improve Outcome: Progressing   

## 2020-08-21 NOTE — Consult Note (Signed)
[place] for Heparin  Indication: ACS / STEMI  Allergies  Allergen Reactions  . Metformin Diarrhea   Patient Measurements: Height: 5\' 8"  (172.7 cm) Weight: 106.3 kg (234 lb 5.6 oz) IBW/kg (Calculated) : 68.4 Heparin Dosing Weight: 104.3 kg (per Care Everywhere documented surgery note on 08/15/2020)  Vital Signs: Temp: 99.4 F (37.4 C) (10/13 2000) Temp Source: Oral (10/13 2000) BP: 102/62 (10/14 0400) Pulse Rate: 52 (10/14 0400)  Labs: Recent Labs    08/18/20 1756 08/18/20 2003 08/19/20 0417 08/19/20 0417 08/19/20 1020 08/19/20 1149 08/19/20 1541 08/20/20 0354 08/21/20 0423  HGB   < >  --  12.6*   < >  --   --   --  12.1* 11.2*  HCT   < >  --  37.6*  --   --   --   --  37.9* 35.2*  PLT   < >  --  165  --   --   --   --  146* 123*  APTT  --  30  --   --   --   --   --   --   --   LABPROT  --  13.5  --   --   --   --   --   --   --   INR  --  1.1  --   --   --   --   --   --   --   HEPARINUNFRC  --   --  0.59   < >  --  0.47  --  0.40 0.18*  CREATININE   < >  --  1.71*   < >  --   --  1.57* 1.69* 2.00*  TROPONINIHS  --  706*  --   --  4,808* 3,890*  --   --   --    < > = values in this interval not displayed.    Estimated Creatinine Clearance: 33.7 mL/min (A) (by C-G formula based on SCr of 2 mg/dL (H)).   Medical History: Past Medical History:  Diagnosis Date  . Diabetes mellitus without complication ([place])   . Hypertension     Medications:  No PTA anticoagulation - per chart review and verified with ED nurse.   Assessment: Pharmacy has been consulted to monitor heparin dosing in a patient who presented to the ED as post cardiac arrest. Per notes, EMS found patient pulseless and in V-fib. Per ED nurse, patient is intubated. As such, patient's weight was obtained from chart review of recent weight documented for a surgery. ED nurse will follow up with patient.   Baseline CBC appropriate.   Goal of Therapy:  Heparin  level 0.3-0.7 units/ml Monitor platelets by anticoagulation protocol: Yes   Plan:  10/13 0354 HL 0.40, therapeutic x 3.  H/H stable, PLTs worse.  Continue Heparin at 1250 units/hr. HL and CBC with morning labs.  10/14 0423 HL 0.18, SUBtherapeutic.  Confirmed w/ nurse no issues w/ infusion.  CBC stable.  Will increase Heparin infusion to 1400 units/hr and recheck HL in 8 hours.  CBC daily w/ labs  Plan for cardiac cath this admission. Will follow up plan for heparin following cath.  Ena Dawley, PharmD 08/21/2020,5:44 AM
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
## 2020-08-21 NOTE — Consult Note (Signed)
[place] for Heparin  Indication: ACS / STEMI  Allergies  Allergen Reactions   Metformin Diarrhea   Patient Measurements: Height: 5\' 8"  (172.7 cm) Weight: 106.4 kg (234 lb 9.1 oz) IBW/kg (Calculated) : 68.4 Heparin Dosing Weight: 104.3 kg (per Care Everywhere documented surgery note on 08/15/2020)  Vital Signs: Temp: 98.9 F (37.2 C) (10/14 1411) Temp Source: Oral (10/14 1411) BP: 115/63 (10/14 1411) Pulse Rate: 57 (10/14 1411)  Labs: Recent Labs    08/18/20 1756 08/18/20 2003 08/19/20 0417 08/19/20 0417 08/19/20 1020 08/19/20 1149 08/19/20 1149 08/19/20 1541 08/20/20 0354 08/21/20 0423 08/21/20 1418  HGB   < >  --  12.6*   < >  --   --   --   --  12.1* 11.2*  --   HCT   < >  --  37.6*  --   --   --   --   --  37.9* 35.2*  --   PLT   < >  --  165  --   --   --   --   --  146* 123*  --   APTT  --  30  --   --   --   --   --   --   --   --   --   LABPROT  --  13.5  --   --   --   --   --   --   --   --   --   INR  --  1.1  --   --   --   --   --   --   --   --   --   HEPARINUNFRC  --   --  0.59   < >  --  0.47   < >  --  0.40 0.18* 0.30  CREATININE   < >  --  1.71*   < >  --   --   --  1.57* 1.69* 2.00*  --   TROPONINIHS  --  706*  --   --  4,808* 3,890*  --   --   --   --   --    < > = values in this interval not displayed.    Estimated Creatinine Clearance: 33.7 mL/min (A) (by C-G formula based on SCr of 2 mg/dL (H)).   Medical History: Past Medical History:  Diagnosis Date   Diabetes mellitus without complication ([place])    Hypertension     Medications:  No PTA anticoagulation - per chart review and verified with ED nurse.   Assessment: Pharmacy has been consulted to monitor heparin dosing in a patient who presented to the ED as post cardiac arrest. Per notes, EMS found patient pulseless and in V-fib. Per ED nurse, patient is intubated. As such, patient's weight was obtained from chart review of recent weight  documented for a surgery. ED nurse will follow up with patient.   Baseline CBC appropriate.   10/14 0423 HL 0.18  10/14 1418 HL 0.3   Goal of Therapy:  Heparin level 0.3-0.7 units/ml Monitor platelets by anticoagulation protocol: Yes   Plan:  Heparin level is therapeutic. Will continue heparin infusion at 1400 unit/hr.  Confirmed w/ nurse no issues w/ infusion. Recheck HL in 8 hours.  CBC daily w/ labs  Plan for cardiac cath this admission. Will follow up plan for heparin following cath.  Oswald Hillock, PharmD 08/21/2020,2:39  PM

## 2020-08-22 ENCOUNTER — Encounter: Payer: Self-pay | Admitting: Obstetrics and Gynecology

## 2020-08-22 DIAGNOSIS — I5020 Unspecified systolic (congestive) heart failure: Secondary | ICD-10-CM | POA: Diagnosis not present

## 2020-08-22 DIAGNOSIS — I469 Cardiac arrest, cause unspecified: Secondary | ICD-10-CM | POA: Diagnosis not present

## 2020-08-22 DIAGNOSIS — I4901 Ventricular fibrillation: Secondary | ICD-10-CM | POA: Diagnosis not present

## 2020-08-22 DIAGNOSIS — I214 Non-ST elevation (NSTEMI) myocardial infarction: Secondary | ICD-10-CM | POA: Diagnosis not present

## 2020-08-22 LAB — HEPARIN LEVEL (UNFRACTIONATED)
Heparin Unfractionated: <0.1 IU/mL — ABNORMAL LOW (ref 0.30–0.70)
Heparin Unfractionated: 0.29 IU/mL — ABNORMAL LOW (ref 0.30–0.70)
Heparin Unfractionated: 0.2 IU/mL — ABNORMAL LOW (ref 0.30–0.70)

## 2020-08-22 LAB — GLUCOSE, CAPILLARY
Glucose-Capillary: 148 mg/dL — ABNORMAL HIGH (ref 70–99)
Glucose-Capillary: 135 mg/dL — ABNORMAL HIGH (ref 70–99)
Glucose-Capillary: 122 mg/dL — ABNORMAL HIGH (ref 70–99)
Glucose-Capillary: 188 mg/dL — ABNORMAL HIGH (ref 70–99)

## 2020-08-22 LAB — CBC
HCT: 35.2 % — ABNORMAL LOW (ref 39.0–52.0)
Hemoglobin: 11.6 g/dL — ABNORMAL LOW (ref 13.0–17.0)
MCH: 29.7 pg (ref 26.0–34.0)
MCHC: 33 g/dL (ref 30.0–36.0)
MCV: 90.3 fL (ref 80.0–100.0)
Platelets: 129 10*3/uL — ABNORMAL LOW (ref 150–400)
RBC: 3.9 MIL/uL — ABNORMAL LOW (ref 4.22–5.81)
RDW: 13.2 % (ref 11.5–15.5)
WBC: 6.1 10*3/uL (ref 4.0–10.5)
nRBC: 0 % (ref 0.0–0.2)

## 2020-08-22 LAB — COMPREHENSIVE METABOLIC PANEL
ALT: 90 U/L — ABNORMAL HIGH (ref 0–44)
AST: 39 U/L (ref 15–41)
Albumin: 2.8 g/dL — ABNORMAL LOW (ref 3.5–5.0)
Alkaline Phosphatase: 51 U/L (ref 38–126)
Anion gap: 8 (ref 5–15)
BUN: 28 mg/dL — ABNORMAL HIGH (ref 8–23)
CO2: 24 mmol/L (ref 22–32)
Calcium: 8 mg/dL — ABNORMAL LOW (ref 8.9–10.3)
Chloride: 101 mmol/L (ref 98–111)
Creatinine, Ser: 1.85 mg/dL — ABNORMAL HIGH (ref 0.61–1.24)
GFR, Estimated: 33 mL/min — ABNORMAL LOW (ref >60)
Glucose, Bld: 158 mg/dL — ABNORMAL HIGH (ref 70–99)
Potassium: 4.1 mmol/L (ref 3.5–5.1)
Sodium: 133 mmol/L — ABNORMAL LOW (ref 135–145)
Total Bilirubin: 0.6 mg/dL (ref 0.3–1.2)
Total Protein: 5.9 g/dL — ABNORMAL LOW (ref 6.5–8.1)

## 2020-08-22 LAB — MAGNESIUM: Magnesium: 2 mg/dL (ref 1.7–2.4)

## 2020-08-22 LAB — PHOSPHORUS: Phosphorus: 2.6 mg/dL (ref 2.5–4.6)

## 2020-08-22 MED ORDER — HEPARIN BOLUS VIA INFUSION
500.0000 [IU] | Freq: Once | INTRAVENOUS | Status: AC
  Administered 2020-08-22: 500 [IU] via INTRAVENOUS
  Filled 2020-08-22: qty 500

## 2020-08-22 MED ORDER — HEPARIN BOLUS VIA INFUSION
2000.0000 [IU] | Freq: Once | INTRAVENOUS | Status: AC
  Administered 2020-08-22: 2000 [IU] via INTRAVENOUS
  Filled 2020-08-22: qty 2000

## 2020-08-22 MED ORDER — HEPARIN BOLUS VIA INFUSION
1550.0000 [IU] | Freq: Once | INTRAVENOUS | Status: AC
  Administered 2020-08-22: 1550 [IU] via INTRAVENOUS
  Filled 2020-08-22: qty 1550

## 2020-08-22 NOTE — Consult Note (Signed)
[place] for Heparin  Indication: ACS / STEMI  Allergies  Allergen Reactions  . Metformin Diarrhea   Patient Measurements: Height: 5\' 8"  (172.7 cm) Weight: 106.4 kg (234 lb 9.1 oz) IBW/kg (Calculated) : 68.4 Heparin Dosing Weight: 104.3 kg (per Care Everywhere documented surgery note on 08/15/2020)  Vital Signs: Temp: 98.4 F (36.9 C) (10/15 1554) Temp Source: Oral (10/15 1554) BP: 127/66 (10/15 1554) Pulse Rate: 79 (10/15 1554)  Labs: Recent Labs    08/20/20 0354 08/20/20 0354 08/21/20 0423 08/21/20 0423 08/21/20 1418 08/21/20 2226 08/22/20 0504 08/22/20 0919  HGB 12.1*   < > 11.2*  --   --   --   --  11.6*  HCT 37.9*  --  35.2*  --   --   --   --  35.2*  PLT 146*  --  123*  --   --   --   --  129*  HEPARINUNFRC 0.40   < > 0.18*   < > 0.30 <0.10*  --  0.29*  CREATININE 1.69*  --  2.00*  --   --   --  1.85*  --    < > = values in this interval not displayed.    Estimated Creatinine Clearance: 35.8 mL/min (A) (by C-G formula based on SCr of 1.85 mg/dL (H)).   Medical History: Past Medical History:  Diagnosis Date  . Diabetes mellitus without complication ([place])   . Hypertension     Medications:  No PTA anticoagulation - per chart review and verified with ED nurse.   Assessment: Pharmacy has been consulted to monitor heparin dosing in a patient who presented to the ED as post cardiac arrest. Per notes, EMS found patient pulseless and in V-fib. Per ED nurse, patient is intubated. As such, patient's weight was obtained from chart review of recent weight documented for a surgery. ED nurse will follow up with patient.   Baseline CBC appropriate. CBC stable  10/12 0417 HL 0.59; therapeutic x1 10/12 1149 HL 0.47; therapeutic x2 10/13 0354 HL 0.40; therapeutic x3 10/14 0423 HL 0.18; subtherapeutic, confirmed with nurse no issues w/ infusion and rate increased to 1400 units/hr 10/14 1418 HL 0.3; therapeutic x1 1014 2226 HL <  0.10, confirmed w/ nurse no issues w/ infusion; 2000 unit bolus and rate increased to 1600 units/hr 1015 0919 HL 0.29; subtherapeutic - 500 unit bolus and rate increased to 1700 units/hr 1015 1802 HL 0.20; subtherapeutic, confirmed with nurse no issues w/ infusion  Goal of Therapy:  Heparin level 0.3-0.7 units/ml Monitor platelets by anticoagulation protocol: Yes   Plan:  HL @1802  0.20; subtherapeutic - will give 1550 unit bolus and increase rate to 1900 units/hr Recheck HL in 8 hours and CBC daily with AM labs  Plan for cardiac cath this admission when renal function allows (today [10/15] vs early next week). Will follow up plan for heparin following cath.  Sherilyn Banker, PharmD Pharmacy Resident  08/22/2020 4:32 PM
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
## 2020-08-22 NOTE — Consult Note (Signed)
[place] for Heparin  Indication: ACS / STEMI  Allergies  Allergen Reactions  . Metformin Diarrhea   Patient Measurements: Height: 5\' 8"  (172.7 cm) Weight: 106.4 kg (234 lb 9.1 oz) IBW/kg (Calculated) : 68.4 Heparin Dosing Weight: 104.3 kg (per Care Everywhere documented surgery note on 08/15/2020)  Vital Signs: Temp: 98.6 F (37 C) (10/14 1935) Temp Source: Oral (10/14 1935) BP: 126/64 (10/14 1935) Pulse Rate: 65 (10/14 1935)  Labs: Recent Labs    08/19/20 0417 08/19/20 0417 08/19/20 1020 08/19/20 1149 08/19/20 1149 08/19/20 1541 08/20/20 0354 08/20/20 0354 08/21/20 0423 08/21/20 1418 08/21/20 2226  HGB 12.6*   < >  --   --   --   --  12.1*  --  11.2*  --   --   HCT 37.6*  --   --   --   --   --  37.9*  --  35.2*  --   --   PLT 165  --   --   --   --   --  146*  --  123*  --   --   HEPARINUNFRC 0.59   < >  --  0.47   < >  --  0.40   < > 0.18* 0.30 <0.10*  CREATININE 1.71*   < >  --   --   --  1.57* 1.69*  --  2.00*  --   --   TROPONINIHS  --   --  4,268* 3,890*  --   --   --   --   --   --   --    < > = values in this interval not displayed.    Estimated Creatinine Clearance: 33.1 mL/min (A) (by C-G formula based on SCr of 2 mg/dL (H)).   Medical History: Past Medical History:  Diagnosis Date  . Diabetes mellitus without complication ([place])   . Hypertension     Medications:  No PTA anticoagulation - per chart review and verified with ED nurse.   Assessment: Pharmacy has been consulted to monitor heparin dosing in a patient who presented to the ED as post cardiac arrest. Per notes, EMS found patient pulseless and in V-fib. Per ED nurse, patient is intubated. As such, patient's weight was obtained from chart review of recent weight documented for a surgery. ED nurse will follow up with patient.   Baseline CBC appropriate.   10/14 0423 HL 0.18  10/14 1418 HL 0.3  1014 2226 HL < 0.10, confirmed w/ nurse no issues w/  infusion.    Goal of Therapy:  Heparin level 0.3-0.7 units/ml Monitor platelets by anticoagulation protocol: Yes   Plan:  Heparin level is SUBtherapeutic. Will rebolus with 2000 units x 1 and increase heparin infusion to 1600 unit/hr.  Recheck HL in 8 hours.  CBC daily w/ labs  Plan for cardiac cath this admission. Will follow up plan for heparin following cath.  Ena Dawley, PharmD 08/22/2020,1:26 AM
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
## 2020-08-22 NOTE — Care Management Important Message (Signed)
Important Message  Patient Details  Name: [name] MRN: [id number] Date of Birth: 05/02/1935   Medicare Important Message Given:  Yes     Dannette Barbara 08/22/2020, 2:13 PM

## 2020-08-22 NOTE — Consult Note (Signed)
ANTICOAGULATION CONSULT NOTE  Pharmacy Consult for Heparin  Indication: ACS / STEMI  Allergies  Allergen Reactions   Metformin Diarrhea   Patient Measurements: Height: 5\' 8"  (172.7 cm) Weight: 106.4 kg (234 lb 9.1 oz) IBW/kg (Calculated) : 68.4 Heparin Dosing Weight: 104.3 kg (per Care Everywhere documented surgery note on 08/15/2020)  Vital Signs: Temp: 98.9 F (37.2 C) (10/15 0804) Temp Source: Oral (10/15 0804) BP: 116/68 (10/15 0804) Pulse Rate: 54 (10/15 0804)  Labs: Recent Labs    08/19/20 1020 08/19/20 1149 08/19/20 1541 08/20/20 0354 08/20/20 0354 08/21/20 0423 08/21/20 0423 08/21/20 1418 08/21/20 2226 08/22/20 0504 08/22/20 0919  HGB  --   --   --  12.1*   < > 11.2*  --   --   --   --  11.6*  HCT  --   --   --  37.9*  --  35.2*  --   --   --   --  35.2*  PLT  --   --   --  146*  --  123*  --   --   --   --  129*  HEPARINUNFRC  --  0.47   < > 0.40   < > 0.18*   < > 0.30 <0.10*  --  0.29*  CREATININE  --   --    < > 1.69*  --  2.00*  --   --   --  1.85*  --   TROPONINIHS 4,808* 3,890*  --   --   --   --   --   --   --   --   --    < > = values in this interval not displayed.    Estimated Creatinine Clearance: 35.8 mL/min (A) (by C-G formula based on SCr of 1.85 mg/dL (H)).   Medical History: Past Medical History:  Diagnosis Date   Diabetes mellitus without complication ([place])    Hypertension     Medications:  No PTA anticoagulation - per chart review and verified with ED nurse.   Assessment: Pharmacy has been consulted to monitor heparin dosing in a patient who presented to the ED as post cardiac arrest. Per notes, EMS found patient pulseless and in V-fib. Per ED nurse, patient is intubated. As such, patient's weight was obtained from chart review of recent weight documented for a surgery. ED nurse will follow up with patient.   Baseline CBC appropriate.   10/14 0423 HL 0.18  10/14 1418 HL 0.3  1014 2226 HL < 0.10, confirmed w/ nurse no issues  w/ infusion.   1015 0919 HL 0.29 @ 1600 units/hr  Goal of Therapy:  Heparin level 0.3-0.7 units/ml Monitor platelets by anticoagulation protocol: Yes   Plan:  Heparin level is slightly SUBtherapeutic @ 0.29.   Will bolus 500 units and increase heparin infusion to 1700 unit/hr.  Recheck HL in 8 hours.    CBC daily w/ labs  Plan for cardiac cath this admission. Will follow up plan for heparin following cath.  Lu Duffel, PharmD, BCPS Clinical Pharmacist 08/22/2020 9:56 AM

## 2020-08-23 ENCOUNTER — Encounter: Payer: Self-pay | Admitting: Obstetrics and Gynecology

## 2020-08-23 DIAGNOSIS — I469 Cardiac arrest, cause unspecified: Secondary | ICD-10-CM | POA: Diagnosis not present

## 2020-08-23 LAB — CBC
HCT: 35.2 % — ABNORMAL LOW (ref 39.0–52.0)
Hemoglobin: 11.7 g/dL — ABNORMAL LOW (ref 13.0–17.0)
MCH: 29.5 pg (ref 26.0–34.0)
MCHC: 33.2 g/dL (ref 30.0–36.0)
MCV: 88.9 fL (ref 80.0–100.0)
Platelets: 135 10*3/uL — ABNORMAL LOW (ref 150–400)
RBC: 3.96 MIL/uL — ABNORMAL LOW (ref 4.22–5.81)
RDW: 13 % (ref 11.5–15.5)
WBC: 5.4 10*3/uL (ref 4.0–10.5)
nRBC: 0 % (ref 0.0–0.2)

## 2020-08-23 LAB — COMPREHENSIVE METABOLIC PANEL
ALT: 69 U/L — ABNORMAL HIGH (ref 0–44)
AST: 31 U/L (ref 15–41)
Albumin: 2.7 g/dL — ABNORMAL LOW (ref 3.5–5.0)
Alkaline Phosphatase: 51 U/L (ref 38–126)
Anion gap: 9 (ref 5–15)
BUN: 25 mg/dL — ABNORMAL HIGH (ref 8–23)
CO2: 21 mmol/L — ABNORMAL LOW (ref 22–32)
Calcium: 7.8 mg/dL — ABNORMAL LOW (ref 8.9–10.3)
Chloride: 103 mmol/L (ref 98–111)
Creatinine, Ser: 1.59 mg/dL — ABNORMAL HIGH (ref 0.61–1.24)
GFR, Estimated: 40 mL/min — ABNORMAL LOW (ref >60)
Glucose, Bld: 186 mg/dL — ABNORMAL HIGH (ref 70–99)
Potassium: 3.8 mmol/L (ref 3.5–5.1)
Sodium: 133 mmol/L — ABNORMAL LOW (ref 135–145)
Total Bilirubin: 0.7 mg/dL (ref 0.3–1.2)
Total Protein: 6 g/dL — ABNORMAL LOW (ref 6.5–8.1)

## 2020-08-23 LAB — GLUCOSE, CAPILLARY
Glucose-Capillary: 141 mg/dL — ABNORMAL HIGH (ref 70–99)
Glucose-Capillary: 162 mg/dL — ABNORMAL HIGH (ref 70–99)
Glucose-Capillary: 148 mg/dL — ABNORMAL HIGH (ref 70–99)
Glucose-Capillary: 85 mg/dL (ref 70–99)

## 2020-08-23 LAB — TROPONIN I (HIGH SENSITIVITY)
Troponin I (High Sensitivity): 7414 ng/L (ref <18)
Troponin I (High Sensitivity): 7167 ng/L (ref <18)
Troponin I (High Sensitivity): 7228 ng/L (ref <18)
Troponin I (High Sensitivity): 6882 ng/L (ref <18)
Troponin I (High Sensitivity): 6979 ng/L (ref <18)

## 2020-08-23 LAB — HEPARIN LEVEL (UNFRACTIONATED)
Heparin Unfractionated: 0.36 IU/mL (ref 0.30–0.70)
Heparin Unfractionated: 0.37 IU/mL (ref 0.30–0.70)

## 2020-08-23 LAB — MAGNESIUM: Magnesium: 1.9 mg/dL (ref 1.7–2.4)

## 2020-08-23 MED ORDER — SODIUM CHLORIDE 0.9% FLUSH
3.0000 mL | Freq: Two times a day (BID) | INTRAVENOUS | Status: DC
  Administered 2020-08-23 – 2020-08-24 (×3): 3 mL via INTRAVENOUS

## 2020-08-23 MED ORDER — GUAIFENESIN-DM 100-10 MG/5ML PO SYRP
5.0000 mL | ORAL_SOLUTION | ORAL | Status: DC | PRN
  Administered 2020-08-23 – 2020-08-24 (×4): 5 mL via ORAL
  Filled 2020-08-23 (×4): qty 5

## 2020-08-23 MED ORDER — PANTOPRAZOLE SODIUM 40 MG PO TBEC
40.0000 mg | DELAYED_RELEASE_TABLET | Freq: Two times a day (BID) | ORAL | Status: DC
  Administered 2020-08-23 – 2020-08-25 (×5): 40 mg via ORAL
  Filled 2020-08-23 (×6): qty 1

## 2020-08-23 MED ORDER — NITROGLYCERIN 2 % TD OINT
1.0000 [in_us] | TOPICAL_OINTMENT | Freq: Four times a day (QID) | TRANSDERMAL | Status: DC
  Administered 2020-08-23 – 2020-08-25 (×9): 1 [in_us] via TOPICAL
  Filled 2020-08-23 (×9): qty 1

## 2020-08-23 MED ORDER — INSULIN DETEMIR 100 UNIT/ML ~~LOC~~ SOLN
10.0000 [IU] | Freq: Two times a day (BID) | SUBCUTANEOUS | Status: DC
  Administered 2020-08-23 – 2020-08-24 (×3): 10 [IU] via SUBCUTANEOUS
  Filled 2020-08-23 (×5): qty 0.1
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
## 2020-08-23 NOTE — Consult Note (Signed)
[place] for Heparin  Indication: ACS / STEMI  Allergies  Allergen Reactions  . Metformin Diarrhea   Patient Measurements: Height: 5\' 8"  (172.7 cm) Weight: 106.4 kg (234 lb 9.1 oz) IBW/kg (Calculated) : 68.4 Heparin Dosing Weight: 104.3 kg (per Care Everywhere documented surgery note on 08/15/2020)  Vital Signs: Temp: 98.3 F (36.8 C) (10/15 1958) Temp Source: Oral (10/15 1554) BP: 121/65 (10/15 2205) Pulse Rate: 55 (10/15 2205)  Labs: Recent Labs    08/21/20 0423 08/21/20 1418 08/21/20 2226 08/22/20 0504 08/22/20 0919 08/22/20 1802 08/23/20 0151  HGB 11.2*  --   --   --  11.6*  --  11.7*  HCT 35.2*  --   --   --  35.2*  --  35.2*  PLT 123*  --   --   --  129*  --  135*  HEPARINUNFRC 0.18*   < >   < >  --  0.29* 0.20* 0.36  CREATININE 2.00*  --   --  1.85*  --   --  1.59*   < > = values in this interval not displayed.    Estimated Creatinine Clearance: 41.6 mL/min (A) (by C-G formula based on SCr of 1.59 mg/dL (H)).   Medical History: Past Medical History:  Diagnosis Date  . Diabetes mellitus without complication ([place])   . Hypertension     Medications:  No PTA anticoagulation - per chart review and verified with ED nurse.   Assessment: Pharmacy has been consulted to monitor heparin dosing in a patient who presented to the ED as post cardiac arrest. Per notes, EMS found patient pulseless and in V-fib. Per ED nurse, patient is intubated. As such, patient's weight was obtained from chart review of recent weight documented for a surgery. ED nurse will follow up with patient.   Baseline CBC appropriate. CBC stable  10/12 0417 HL 0.59; therapeutic x1 10/12 1149 HL 0.47; therapeutic x2 10/13 0354 HL 0.40; therapeutic x3 10/14 0423 HL 0.18; subtherapeutic, confirmed with nurse no issues w/ infusion and rate increased to 1400 units/hr 10/14 1418 HL 0.3; therapeutic x1 1014 2226 HL < 0.10, confirmed w/ nurse no issues w/  infusion; 2000 unit bolus and rate increased to 1600 units/hr 1015 0919 HL 0.29; subtherapeutic - 500 unit bolus and rate increased to 1700 units/hr 1015 1802 HL 0.20; subtherapeutic, confirmed with nurse no issues w/ infusion 1016 0151 HL 0.36, therapeutic x 1, CBC stable  Goal of Therapy:  Heparin level 0.3-0.7 units/ml Monitor platelets by anticoagulation protocol: Yes   Plan:  HL 0.36; therapeutic x 1 - will continue Heparin rate at 1900 units/hr Recheck HL in 8 hours to confirm.  CBC daily with AM labs  Plan for cardiac cath this admission when renal function allows (today [10/15] vs early next week). Will follow up plan for heparin following cath.  Ena Dawley, PharmD 08/23/2020 2:19 AM

## 2020-08-23 NOTE — Progress Notes (Signed)
CRITICAL VALUE ALERT  Critical Value:  Troponin 7414  Date & Time Notied: 08/23/2020 10:53 AM    Provider Notified: Ignacia Bayley, NP  Orders Received/Actions taken: Patient already on heparin gtt and has a cath scheduled for Monday.

## 2020-08-23 NOTE — Plan of Care (Signed)
  Problem: Education: Goal: Knowledge of General Education information will improve Description: Including pain rating scale, medication(s)/side effects and non-pharmacologic comfort measures Outcome: Progressing   Problem: Clinical Measurements: Goal: Cardiovascular complication will be avoided Outcome: Progressing   Problem: Pain Managment: Goal: General experience of comfort will improve Outcome: Progressing   Problem: Safety: Goal: Ability to remain free from injury will improve Outcome: Progressing   

## 2020-08-23 NOTE — Progress Notes (Signed)
CCMD called and reported that pt was on second degree type 1 AV block. Pt was asymptomatic on assessment. Notfiy MD [place]. Will continue to monitor

## 2020-08-23 NOTE — Consult Note (Signed)
[place] for Heparin  Indication: ACS / STEMI  Allergies  Allergen Reactions  . Metformin Diarrhea   Patient Measurements: Height: 5\' 8"  (172.7 cm) Weight: 107.1 kg (236 lb 1.8 oz) IBW/kg (Calculated) : 68.4 Heparin Dosing Weight: 104.3 kg (per Care Everywhere documented surgery note on 08/15/2020)  Vital Signs: Temp: 99 F (37.2 C) (10/16 0814) Temp Source: Oral (10/16 0814) BP: 129/74 (10/16 0814) Pulse Rate: 51 (10/16 0814)  Labs: Recent Labs    08/21/20 0423 08/21/20 1418 08/21/20 2226 08/22/20 0504 08/22/20 0919 08/22/20 0919 08/22/20 1802 08/23/20 0151 08/23/20 0923  HGB 11.2*  --   --   --  11.6*  --   --  11.7*  --   HCT 35.2*  --   --   --  35.2*  --   --  35.2*  --   PLT 123*  --   --   --  129*  --   --  135*  --   HEPARINUNFRC 0.18*   < >   < >  --  0.29*   < > 0.20* 0.36 0.37  CREATININE 2.00*  --   --  1.85*  --   --   --  1.59*  --   TROPONINIHS  --   --   --   --   --   --   --   --  7,414*   < > = values in this interval not displayed.    Estimated Creatinine Clearance: 41.8 mL/min (A) (by C-G formula based on SCr of 1.59 mg/dL (H)).   Medical History: Past Medical History:  Diagnosis Date  . Diabetes mellitus without complication ([place])   . Hypertension     Medications:  No PTA anticoagulation - per chart review and verified with ED nurse.   Assessment: Pharmacy has been consulted to monitor heparin dosing in a patient who presented to the ED as post cardiac arrest. Per notes, EMS found patient pulseless and in V-fib. Per ED nurse, patient is intubated. As such, patient's weight was obtained from chart review of recent weight documented for a surgery. ED nurse will follow up with patient.   Baseline CBC appropriate. CBC stable  10/12 0417 HL 0.59; therapeutic x1 10/12 1149 HL 0.47; therapeutic x2 10/13 0354 HL 0.40; therapeutic x3 10/14 0423 HL 0.18; subtherapeutic, confirmed with nurse no issues w/  infusion and rate increased to 1400 units/hr 10/14 1418 HL 0.3; therapeutic x1 1014 2226 HL < 0.10, confirmed w/ nurse no issues w/ infusion; 2000 unit bolus and rate increased to 1600 units/hr 1015 0919 HL 0.29; subtherapeutic - 500 unit bolus and rate increased to 1700 units/hr 1015 1802 HL 0.20; subtherapeutic, confirmed with nurse no issues w/ infusion 1016 0151 HL 0.36, therapeutic x 1, CBC stable  Goal of Therapy:  Heparin level 0.3-0.7 units/ml Monitor platelets by anticoagulation protocol: Yes   Plan:  10/16 0923 HL 0.37; therapeutic x2 - will continue Heparin rate at 1900 units/hr HL/ CBC daily with AM labs  Plan for cardiac cath this admission when renal function allows (today [10/15] vs early next week). Will follow up plan for heparin following cath.  Tjuana Vickrey A, PharmD 08/23/2020 11:03 AM
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
## 2020-08-24 DIAGNOSIS — I469 Cardiac arrest, cause unspecified: Secondary | ICD-10-CM | POA: Diagnosis not present

## 2020-08-24 LAB — CBC
HCT: 34.7 % — ABNORMAL LOW (ref 39.0–52.0)
Hemoglobin: 11.6 g/dL — ABNORMAL LOW (ref 13.0–17.0)
MCH: 29.4 pg (ref 26.0–34.0)
MCHC: 33.4 g/dL (ref 30.0–36.0)
MCV: 87.8 fL (ref 80.0–100.0)
Platelets: 163 10*3/uL (ref 150–400)
RBC: 3.95 MIL/uL — ABNORMAL LOW (ref 4.22–5.81)
RDW: 13 % (ref 11.5–15.5)
WBC: 6 10*3/uL (ref 4.0–10.5)
nRBC: 0 % (ref 0.0–0.2)

## 2020-08-24 LAB — GLUCOSE, CAPILLARY
Glucose-Capillary: 112 mg/dL — ABNORMAL HIGH (ref 70–99)
Glucose-Capillary: 161 mg/dL — ABNORMAL HIGH (ref 70–99)
Glucose-Capillary: 119 mg/dL — ABNORMAL HIGH (ref 70–99)
Glucose-Capillary: 134 mg/dL — ABNORMAL HIGH (ref 70–99)

## 2020-08-24 LAB — COMPREHENSIVE METABOLIC PANEL
ALT: 52 U/L — ABNORMAL HIGH (ref 0–44)
AST: 26 U/L (ref 15–41)
Albumin: 2.6 g/dL — ABNORMAL LOW (ref 3.5–5.0)
Alkaline Phosphatase: 49 U/L (ref 38–126)
Anion gap: 9 (ref 5–15)
BUN: 22 mg/dL (ref 8–23)
CO2: 23 mmol/L (ref 22–32)
Calcium: 8 mg/dL — ABNORMAL LOW (ref 8.9–10.3)
Chloride: 105 mmol/L (ref 98–111)
Creatinine, Ser: 1.54 mg/dL — ABNORMAL HIGH (ref 0.61–1.24)
GFR, Estimated: 41 mL/min — ABNORMAL LOW (ref >60)
Glucose, Bld: 123 mg/dL — ABNORMAL HIGH (ref 70–99)
Potassium: 4.2 mmol/L (ref 3.5–5.1)
Sodium: 137 mmol/L (ref 135–145)
Total Bilirubin: 0.8 mg/dL (ref 0.3–1.2)
Total Protein: 6.1 g/dL — ABNORMAL LOW (ref 6.5–8.1)

## 2020-08-24 LAB — MAGNESIUM: Magnesium: 2 mg/dL (ref 1.7–2.4)

## 2020-08-24 LAB — HEPARIN LEVEL (UNFRACTIONATED): Heparin Unfractionated: 0.6 IU/mL (ref 0.30–0.70)
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
## 2020-08-25 ENCOUNTER — Inpatient Hospital Stay (HOSPITAL_COMMUNITY)
Admission: AD | Admit: 2020-08-25 | Discharge: 2020-09-10 | DRG: 235 | Disposition: A | Discharge status: Home Health | Payer: Medicare HMO | Source: Other Acute Inpatient Hospital | Attending: Cardiothoracic Surgery | Admitting: Cardiothoracic Surgery

## 2020-08-25 ENCOUNTER — Encounter
Admission: EM | Disposition: A | Discharge status: Other Acute Inpatient Hospital | Payer: Self-pay | Source: Home / Self Care | Attending: Obstetrics and Gynecology

## 2020-08-25 ENCOUNTER — Other Ambulatory Visit: Payer: Self-pay | Admitting: *Deleted

## 2020-08-25 ENCOUNTER — Encounter: Payer: Self-pay | Admitting: Cardiovascular Disease

## 2020-08-25 DIAGNOSIS — N1831 Chronic kidney disease, stage 3a: Secondary | ICD-10-CM | POA: Diagnosis present

## 2020-08-25 DIAGNOSIS — I2510 Atherosclerotic heart disease of native coronary artery without angina pectoris: Secondary | ICD-10-CM | POA: Diagnosis present

## 2020-08-25 DIAGNOSIS — I4891 Unspecified atrial fibrillation: Secondary | ICD-10-CM | POA: Diagnosis present

## 2020-08-25 DIAGNOSIS — I4901 Ventricular fibrillation: Secondary | ICD-10-CM | POA: Diagnosis present

## 2020-08-25 DIAGNOSIS — E785 Hyperlipidemia, unspecified: Secondary | ICD-10-CM | POA: Diagnosis present

## 2020-08-25 DIAGNOSIS — Z955 Presence of coronary angioplasty implant and graft: Secondary | ICD-10-CM

## 2020-08-25 DIAGNOSIS — I5020 Unspecified systolic (congestive) heart failure: Secondary | ICD-10-CM | POA: Diagnosis not present

## 2020-08-25 DIAGNOSIS — Z8249 Family history of ischemic heart disease and other diseases of the circulatory system: Secondary | ICD-10-CM | POA: Diagnosis not present

## 2020-08-25 DIAGNOSIS — N990 Postprocedural (acute) (chronic) kidney failure: Secondary | ICD-10-CM | POA: Diagnosis not present

## 2020-08-25 DIAGNOSIS — Z7902 Long term (current) use of antithrombotics/antiplatelets: Secondary | ICD-10-CM

## 2020-08-25 DIAGNOSIS — I214 Non-ST elevation (NSTEMI) myocardial infarction: Secondary | ICD-10-CM | POA: Diagnosis present

## 2020-08-25 DIAGNOSIS — Z951 Presence of aortocoronary bypass graft: Secondary | ICD-10-CM | POA: Diagnosis not present

## 2020-08-25 DIAGNOSIS — Z8679 Personal history of other diseases of the circulatory system: Secondary | ICD-10-CM

## 2020-08-25 DIAGNOSIS — R34 Anuria and oliguria: Secondary | ICD-10-CM | POA: Diagnosis not present

## 2020-08-25 DIAGNOSIS — I10 Essential (primary) hypertension: Secondary | ICD-10-CM | POA: Diagnosis present

## 2020-08-25 DIAGNOSIS — D62 Acute posthemorrhagic anemia: Secondary | ICD-10-CM | POA: Diagnosis not present

## 2020-08-25 DIAGNOSIS — Z6836 Body mass index (BMI) 36.0-36.9, adult: Secondary | ICD-10-CM

## 2020-08-25 DIAGNOSIS — I452 Bifascicular block: Secondary | ICD-10-CM | POA: Diagnosis present

## 2020-08-25 DIAGNOSIS — I25110 Atherosclerotic heart disease of native coronary artery with unstable angina pectoris: Secondary | ICD-10-CM | POA: Diagnosis present

## 2020-08-25 DIAGNOSIS — Z8674 Personal history of sudden cardiac arrest: Secondary | ICD-10-CM

## 2020-08-25 DIAGNOSIS — Z9689 Presence of other specified functional implants: Secondary | ICD-10-CM

## 2020-08-25 DIAGNOSIS — T82855A Stenosis of coronary artery stent, initial encounter: Secondary | ICD-10-CM | POA: Diagnosis present

## 2020-08-25 DIAGNOSIS — E119 Type 2 diabetes mellitus without complications: Secondary | ICD-10-CM

## 2020-08-25 DIAGNOSIS — I5023 Acute on chronic systolic (congestive) heart failure: Secondary | ICD-10-CM | POA: Diagnosis not present

## 2020-08-25 DIAGNOSIS — E1122 Type 2 diabetes mellitus with diabetic chronic kidney disease: Secondary | ICD-10-CM | POA: Diagnosis present

## 2020-08-25 DIAGNOSIS — I130 Hypertensive heart and chronic kidney disease with heart failure and stage 1 through stage 4 chronic kidney disease, or unspecified chronic kidney disease: Secondary | ICD-10-CM | POA: Diagnosis present

## 2020-08-25 DIAGNOSIS — I5042 Chronic combined systolic (congestive) and diastolic (congestive) heart failure: Secondary | ICD-10-CM | POA: Diagnosis present

## 2020-08-25 DIAGNOSIS — I472 Ventricular tachycardia: Secondary | ICD-10-CM | POA: Diagnosis not present

## 2020-08-25 DIAGNOSIS — I700 Atherosclerosis of aorta: Secondary | ICD-10-CM | POA: Diagnosis present

## 2020-08-25 DIAGNOSIS — I5043 Acute on chronic combined systolic (congestive) and diastolic (congestive) heart failure: Secondary | ICD-10-CM | POA: Diagnosis not present

## 2020-08-25 DIAGNOSIS — D631 Anemia in chronic kidney disease: Secondary | ICD-10-CM | POA: Diagnosis present

## 2020-08-25 DIAGNOSIS — Z01810 Encounter for preprocedural cardiovascular examination: Secondary | ICD-10-CM | POA: Diagnosis not present

## 2020-08-25 DIAGNOSIS — Z801 Family history of malignant neoplasm of trachea, bronchus and lung: Secondary | ICD-10-CM | POA: Diagnosis not present

## 2020-08-25 DIAGNOSIS — E669 Obesity, unspecified: Secondary | ICD-10-CM | POA: Diagnosis present

## 2020-08-25 DIAGNOSIS — Z794 Long term (current) use of insulin: Secondary | ICD-10-CM

## 2020-08-25 DIAGNOSIS — Z7982 Long term (current) use of aspirin: Secondary | ICD-10-CM

## 2020-08-25 DIAGNOSIS — R0602 Shortness of breath: Secondary | ICD-10-CM

## 2020-08-25 DIAGNOSIS — Z87891 Personal history of nicotine dependence: Secondary | ICD-10-CM

## 2020-08-25 DIAGNOSIS — K219 Gastro-esophageal reflux disease without esophagitis: Secondary | ICD-10-CM | POA: Diagnosis present

## 2020-08-25 DIAGNOSIS — Z09 Encounter for follow-up examination after completed treatment for conditions other than malignant neoplasm: Secondary | ICD-10-CM

## 2020-08-25 DIAGNOSIS — I469 Cardiac arrest, cause unspecified: Secondary | ICD-10-CM | POA: Diagnosis not present

## 2020-08-25 DIAGNOSIS — I441 Atrioventricular block, second degree: Secondary | ICD-10-CM | POA: Diagnosis present

## 2020-08-25 DIAGNOSIS — E1159 Type 2 diabetes mellitus with other circulatory complications: Secondary | ICD-10-CM | POA: Diagnosis not present

## 2020-08-25 DIAGNOSIS — Z888 Allergy status to other drugs, medicaments and biological substances status: Secondary | ICD-10-CM

## 2020-08-25 DIAGNOSIS — Z79899 Other long term (current) drug therapy: Secondary | ICD-10-CM

## 2020-08-25 DIAGNOSIS — I5022 Chronic systolic (congestive) heart failure: Secondary | ICD-10-CM | POA: Diagnosis not present

## 2020-08-25 DIAGNOSIS — I213 ST elevation (STEMI) myocardial infarction of unspecified site: Secondary | ICD-10-CM | POA: Diagnosis not present

## 2020-08-25 DIAGNOSIS — Z833 Family history of diabetes mellitus: Secondary | ICD-10-CM

## 2020-08-25 HISTORY — PX: LEFT HEART CATH AND CORONARY ANGIOGRAPHY: CATH118249

## 2020-08-25 LAB — GLUCOSE, CAPILLARY
Glucose-Capillary: 112 mg/dL — ABNORMAL HIGH (ref 70–99)
Glucose-Capillary: 117 mg/dL — ABNORMAL HIGH (ref 70–99)

## 2020-08-25 LAB — CBC
HCT: 34.2 % — ABNORMAL LOW (ref 39.0–52.0)
Hemoglobin: 11.5 g/dL — ABNORMAL LOW (ref 13.0–17.0)
MCH: 30.1 pg (ref 26.0–34.0)
MCHC: 33.6 g/dL (ref 30.0–36.0)
MCV: 89.5 fL (ref 80.0–100.0)
Platelets: 174 10*3/uL (ref 150–400)
RBC: 3.82 MIL/uL — ABNORMAL LOW (ref 4.22–5.81)
RDW: 13 % (ref 11.5–15.5)
WBC: 5.9 10*3/uL (ref 4.0–10.5)
nRBC: 0 % (ref 0.0–0.2)

## 2020-08-25 LAB — HEPARIN LEVEL (UNFRACTIONATED): Heparin Unfractionated: 0.59 IU/mL (ref 0.30–0.70)

## 2020-08-25 LAB — COMPREHENSIVE METABOLIC PANEL
ALT: 50 U/L — ABNORMAL HIGH (ref 0–44)
AST: 30 U/L (ref 15–41)
Albumin: 2.8 g/dL — ABNORMAL LOW (ref 3.5–5.0)
Alkaline Phosphatase: 53 U/L (ref 38–126)
Anion gap: 9 (ref 5–15)
BUN: 23 mg/dL (ref 8–23)
CO2: 23 mmol/L (ref 22–32)
Calcium: 8.4 mg/dL — ABNORMAL LOW (ref 8.9–10.3)
Chloride: 104 mmol/L (ref 98–111)
Creatinine, Ser: 1.49 mg/dL — ABNORMAL HIGH (ref 0.61–1.24)
GFR, Estimated: 43 mL/min — ABNORMAL LOW (ref >60)
Glucose, Bld: 129 mg/dL — ABNORMAL HIGH (ref 70–99)
Potassium: 4 mmol/L (ref 3.5–5.1)
Sodium: 136 mmol/L (ref 135–145)
Total Bilirubin: 0.7 mg/dL (ref 0.3–1.2)
Total Protein: 6.2 g/dL — ABNORMAL LOW (ref 6.5–8.1)

## 2020-08-25 SURGERY — LEFT HEART CATH AND CORONARY ANGIOGRAPHY
Anesthesia: Moderate Sedation

## 2020-08-25 MED ORDER — ONDANSETRON HCL 4 MG/2ML IJ SOLN: 4.0000 mg | Freq: Four times a day (QID) | INTRAMUSCULAR | Status: DC | PRN

## 2020-08-25 MED ORDER — SODIUM CHLORIDE 0.9 % IV SOLN: 250.0000 mL | INTRAVENOUS | Status: DC | PRN

## 2020-08-25 MED ORDER — NITROGLYCERIN 0.4 MG SL SUBL: 0.4000 mg | SUBLINGUAL_TABLET | SUBLINGUAL | Status: DC | PRN

## 2020-08-25 MED ORDER — HEPARIN (PORCINE) 25000 UT/250ML-% IV SOLN
1900.0000 [IU]/h | INTRAVENOUS | Status: DC
  Administered 2020-08-25 – 2020-08-28 (×3): 1900 [IU]/h via INTRAVENOUS
  Filled 2020-08-25 (×6): qty 250

## 2020-08-25 MED ORDER — MIDAZOLAM HCL 2 MG/2ML IJ SOLN
INTRAMUSCULAR | Status: DC | PRN
  Administered 2020-08-25: 1 mg via INTRAVENOUS

## 2020-08-25 MED ORDER — FENTANYL CITRATE (PF) 100 MCG/2ML IJ SOLN
INTRAMUSCULAR | Status: DC | PRN
  Administered 2020-08-25: 25 ug via INTRAVENOUS

## 2020-08-25 MED ORDER — SODIUM CHLORIDE 0.9% FLUSH: 3.0000 mL | INTRAVENOUS | Status: DC | PRN

## 2020-08-25 MED ORDER — VERAPAMIL HCL 2.5 MG/ML IV SOLN
INTRAVENOUS | Status: DC | PRN
  Administered 2020-08-25: 2.5 mg via INTRA_ARTERIAL

## 2020-08-25 MED ORDER — IOHEXOL 300 MG/ML  SOLN
INTRAMUSCULAR | Status: DC | PRN
  Administered 2020-08-25: 60 mL

## 2020-08-25 MED ORDER — ROSUVASTATIN CALCIUM 20 MG PO TABS: 40.0000 mg | ORAL_TABLET | Freq: Every day | ORAL | Status: DC

## 2020-08-25 MED ORDER — ACETAMINOPHEN 325 MG PO TABS: 650.0000 mg | ORAL_TABLET | ORAL | Status: DC | PRN

## 2020-08-25 MED ORDER — ASPIRIN 81 MG PO CHEW
81.0000 mg | CHEWABLE_TABLET | ORAL | Status: AC
  Administered 2020-08-25: 81 mg via ORAL
  Filled 2020-08-25: qty 1

## 2020-08-25 MED ORDER — MIDAZOLAM HCL 2 MG/2ML IJ SOLN
INTRAMUSCULAR | Status: AC
  Filled 2020-08-25: qty 2

## 2020-08-25 MED ORDER — SODIUM CHLORIDE 0.9% FLUSH
3.0000 mL | Freq: Two times a day (BID) | INTRAVENOUS | Status: DC
  Administered 2020-08-25: 3 mL via INTRAVENOUS

## 2020-08-25 MED ORDER — VERAPAMIL HCL 2.5 MG/ML IV SOLN
INTRAVENOUS | Status: AC
  Filled 2020-08-25: qty 2

## 2020-08-25 MED ORDER — HEPARIN SODIUM (PORCINE) 1000 UNIT/ML IJ SOLN
INTRAMUSCULAR | Status: DC | PRN
  Administered 2020-08-25: 5000 [IU] via INTRAVENOUS

## 2020-08-25 MED ORDER — HEPARIN (PORCINE) IN NACL 1000-0.9 UT/500ML-% IV SOLN
INTRAVENOUS | Status: AC
  Filled 2020-08-25: qty 1000

## 2020-08-25 MED ORDER — ASPIRIN EC 81 MG PO TBEC: 81.0000 mg | DELAYED_RELEASE_TABLET | Freq: Every day | ORAL | Status: DC

## 2020-08-25 MED ORDER — FENTANYL CITRATE (PF) 100 MCG/2ML IJ SOLN
INTRAMUSCULAR | Status: AC
  Filled 2020-08-25: qty 2

## 2020-08-25 MED ORDER — SODIUM CHLORIDE 0.9 % IV SOLN: INTRAVENOUS | Status: DC

## 2020-08-25 MED ORDER — HEPARIN (PORCINE) IN NACL 1000-0.9 UT/500ML-% IV SOLN
INTRAVENOUS | Status: DC | PRN
  Administered 2020-08-25: 1000 mL

## 2020-08-25 MED ORDER — BUDESONIDE 0.5 MG/2ML IN SUSP
1.0000 mg | Freq: Two times a day (BID) | RESPIRATORY_TRACT | Status: DC
  Administered 2020-08-26 – 2020-08-27 (×2): 1 mg via RESPIRATORY_TRACT
  Filled 2020-08-25 (×3): qty 4

## 2020-08-25 MED ORDER — HEPARIN SODIUM (PORCINE) 1000 UNIT/ML IJ SOLN
INTRAMUSCULAR | Status: AC
  Filled 2020-08-25: qty 1

## 2020-08-25 SURGICAL SUPPLY — 11 items
CATH INFINITI 5FR JK (CATHETERS) ×1 IMPLANT
DEVICE RAD TR BAND REGULAR (VASCULAR PRODUCTS) ×1 IMPLANT
GLIDESHEATH SLEND SS 6F .021 (SHEATH) ×1 IMPLANT
GUIDEWIRE INQWIRE 1.5J.035X260 (WIRE) IMPLANT
INQWIRE 1.5J .035X260CM (WIRE) ×2
KIT RIGHT HEART (MISCELLANEOUS) ×1 IMPLANT
PACK CARDIAC CATH (CUSTOM PROCEDURE TRAY) ×2 IMPLANT
PROTECTION STATION PRESSURIZED (MISCELLANEOUS) ×2
STATION PROTECTION PRESSURIZED (MISCELLANEOUS) IMPLANT
SYR CONTROL 10ML ANGIOGRAPHIC (SYRINGE) ×1 IMPLANT
TUBING CIL FLEX 10 FLL-RA (TUBING) ×1 IMPLANT

## 2020-08-25 NOTE — Discharge Summary (Signed)
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 normal in structure. Trivial mitral valve regurgitation. No evidence of mitral valve stenosis. MV peak gradient, 4.2 mmHg. The mean mitral valve gradient is 2.0 mmHg. Tricuspid Valve: The tricuspid valve is not well visualized. Tricuspid valve regurgitation is trivial. Aortic Valve: The aortic valve has an indeterminant number of cusps. Aortic valve regurgitation is trivial. Mild aortic valve sclerosis is present, with no evidence of aortic valve stenosis. Aortic valve mean gradient measures 4.0 mmHg. Aortic valve peak  gradient measures 8.8 mmHg. Aortic valve area, by VTI measures 2.12 cm. Pulmonic Valve: The pulmonic valve was not well visualized. Pulmonic valve regurgitation is not visualized. No evidence of pulmonic stenosis. Aorta: The aortic root is normal in size and structure. Pulmonary Artery: The pulmonary artery is not well seen. Venous: IVC assessment for right atrial pressure unable to be performed due to mechanical ventilation. IAS/Shunts: The interatrial septum was not well visualized.  LEFT VENTRICLE PLAX 2D LVIDd:         5.24 cm  Diastology LVIDs:          3.53 cm  LV e' medial:    4.68 cm/s LV PW:         1.40 cm  LV E/e' medial:  12.2 LV IVS:        1.22 cm  LV e' lateral:   5.44 cm/s LVOT diam:     2.00 cm  LV E/e' lateral: 10.5 LV SV:         56 LV SV Index:   25 LVOT Area:     3.14 cm  RIGHT VENTRICLE RV Basal diam:  3.07 cm LEFT ATRIUM             Index       RIGHT ATRIUM           Index LA diam:        3.30 cm 1.51 cm/m  RA Area:     11.30 cm LA Vol (A2C):   51.1 ml 23.40 ml/m RA Volume:   22.00 ml  10.08 ml/m LA Vol (A4C):   42.2 ml 19.33 ml/m LA Biplane Vol: 50.9 ml 23.31 ml/m  AORTIC VALVE                   PULMONIC VALVE AV Area (Vmax):    1.80 cm    PV Vmax:       0.95 m/s AV Area (Vmean):   2.04 cm    PV Vmean:      65.700 cm/s AV Area (  VTI):     2.12 cm    PV VTI:        0.215 m AV Vmax:           148.00 cm/s PV Peak grad:  3.6 mmHg AV Vmean:          91.400 cm/s PV Mean grad:  2.0 mmHg AV VTI:            0.262 m AV Peak Grad:      8.8 mmHg AV Mean Grad:      4.0 mmHg LVOT Vmax:         84.90 cm/s LVOT Vmean:        59.400 cm/s LVOT VTI:          0.177 m LVOT/AV VTI ratio: 0.68  AORTA Ao Root diam: 3.50 cm MITRAL VALVE MV Area (PHT): 5.02 cm    SHUNTS MV Peak grad:  4.2 mmHg    Systemic VTI:  0.18 m MV Mean grad:  2.0 mmHg    Systemic Diam: 2.00 cm MV Vmax:       1.02 m/s MV Vmean:      65.1 cm/s MV Decel Time: 151 msec MV E velocity: 57.00 cm/s MV A velocity: 71.10 cm/s MV E/A ratio:  0.80 Harrell Gave End MD Electronically signed by Nelva Bush MD Signature Date/Time: 08/19/2020/10:56:21 AM    Final     Microbiology: Recent Results (from the past 240 hour(s))  Respiratory Panel by RT PCR (Flu A&B, Covid) - Nasopharyngeal Swab     Status: None   Collection Time: 08/18/20  5:05 PM   Specimen: Nasopharyngeal Swab  Result Value Ref Range Status   SARS Coronavirus 2 by RT PCR NEGATIVE NEGATIVE Final    Comment: (NOTE) SARS-CoV-2 target nucleic acids are NOT DETECTED.  The SARS-CoV-2 RNA is generally detectable in upper  respiratoy specimens during the acute phase of infection. The lowest concentration of SARS-CoV-2 viral copies this assay can detect is 131 copies/mL. A negative result does not preclude SARS-Cov-2 infection and should not be used as the sole basis for treatment or other patient management decisions. A negative result may occur with  improper specimen collection/handling, submission of specimen other than nasopharyngeal swab, presence of viral mutation(s) within the areas targeted by this assay, and inadequate number of viral copies (<131 copies/mL). A negative result must be combined with clinical observations, patient history, and epidemiological information. The expected result is Negative.  Fact Sheet for Patients:  PinkCheek.be  Fact Sheet for Healthcare Providers:  GravelBags.it  This test is no t yet approved or cleared by the [place] FDA and  has been authorized for detection and/or diagnosis of SARS-CoV-2 by FDA under an Emergency Use Authorization (EUA). This EUA will remain  in effect (meaning this test can be used) for the duration of the COVID-19 declaration under Section 564(b)(1) of the Act, 21 U.S.C. section 360bbb-3(b)(1), unless the authorization is terminated or revoked sooner.     Influenza A by PCR NEGATIVE NEGATIVE Final   Influenza B by PCR NEGATIVE NEGATIVE Final    Comment: (NOTE) The Xpert Xpress SARS-CoV-2/FLU/RSV assay is intended as an aid in  the diagnosis of influenza from Nasopharyngeal swab specimens and  should not be used as a sole basis for treatment. Nasal washings and  aspirates are unacceptable for Xpert Xpress SARS-CoV-2/FLU/RSV  testing.  Fact Sheet for Patients: PinkCheek.be  Fact Sheet for Healthcare Providers: GravelBags.it  This test is not yet approved or cleared by the  [place] Architectural [employer] and  has been  authorized for detection and/or diagnosis of SARS-CoV-2 by  FDA under an Print production planner (EUA). This EUA will remain  in effect (meaning this test can be used) for the duration of the  Covid-19 declaration under Section 564(b)(1) of the Act, 21  U.S.C. section 360bbb-3(b)(1), unless the authorization is  terminated or revoked. Performed at [facility], [address] [place], [place] [address]   MRSA PCR Screening     Status: None   Collection Time: 08/18/20 11:34 PM   Specimen: Nasopharyngeal  Result Value Ref Range Status   MRSA by PCR NEGATIVE NEGATIVE Final    Comment:        The GeneXpert MRSA Assay (FDA approved for NASAL specimens only), is one component of a comprehensive MRSA colonization surveillance program. It is not intended to diagnose MRSA infection nor to guide or monitor treatment for MRSA infections. Performed at [facility], [address] [place], [place] [id number]      Labs: Basic Metabolic Panel: Recent Labs  Lab 08/18/20 1756 08/18/20 2003 08/19/20 0417 08/19/20 1541 08/20/20 0354 08/20/20 0354 08/21/20 0423 08/22/20 0504 08/23/20 0151 08/24/20 0438 08/25/20 0350  NA   < >  --  143   < > 140   < > 138 133* 133* 137 136  K   < >  --  3.3*   < > 4.1   < > 3.9 4.1 3.8 4.2 4.0  CL   < >  --  109   < > 108   < > 103 101 103 105 104  CO2   < >  --  23   < > 24   < > 26 24 21* 23 23  GLUCOSE   < >  --  132*   < > 141*   < > 162* 158* 186* 123* 129*  BUN   < >  --  23   < > 21   < > 29* 28* 25* 22 23  CREATININE   < >  --  1.71*   < > 1.69*   < > 2.00* 1.85* 1.59* 1.54* 1.49*  CALCIUM   < >  --  8.0*   < > 7.7*   < > 7.7* 8.0* 7.8* 8.0* 8.4*  MG   < > 1.9 2.4  --  2.0  --  2.0 2.0 1.9 2.0  --   PHOS  --  2.1* 2.1*  --  2.4*  --  2.5 2.6  --   --   --    < > = values in this interval not displayed.   Liver Function Tests: Recent Labs  Lab 08/20/20 0354 08/22/20 0504 08/23/20 0151 08/24/20 0438  08/25/20 0350  AST 198* 39 31 26 30   ALT 231* 90* 69* 52* 50*  ALKPHOS 54 51 51 49 53  BILITOT 1.2 0.6 0.7 0.8 0.7  PROT 6.0* 5.9* 6.0* 6.1* 6.2*  ALBUMIN 3.0* 2.8* 2.7* 2.6* 2.8*   No results for input(s): LIPASE, AMYLASE in the last 168 hours. No results for input(s): AMMONIA in the last 168 hours. CBC: Recent Labs  Lab 08/18/20 1756 08/19/20 0417 08/21/20 0423 08/22/20 0919 08/23/20 0151 08/24/20 0438 08/25/20 0350  WBC 12.2*   < > 6.8 6.1 5.4 6.0 5.9  NEUTROABS 9.4*  --   --   --   --   --   --   HGB 12.2*   < >  11.2* 11.6* 11.7* 11.6* 11.5*  HCT 38.1*   < > 35.2* 35.2* 35.2* 34.7* 34.2*  MCV 91.8   < > 92.9 90.3 88.9 87.8 89.5  PLT 186   < > 123* 129* 135* 163 174   < > = values in this interval not displayed.   Cardiac Enzymes: No results for input(s): CKTOTAL, CKMB, CKMBINDEX, TROPONINI in the last 168 hours. BNP: BNP (last 3 results) Recent Labs    08/18/20 1756  BNP 189.4*    ProBNP (last 3 results) No results for input(s): PROBNP in the last 8760 hours.  CBG: Recent Labs  Lab 08/24/20 0819 08/24/20 1131 08/24/20 1634 08/24/20 2017 08/25/20 0722  GLUCAP 112* 161* 119* 134* 112*       Signed:  Desma Maxim MD.  [facility] 08/25/2020, 9:40 AM

## 2020-08-25 NOTE — Consult Note (Signed)
ANTICOAGULATION CONSULT NOTE  Pharmacy Consult for Heparin  Indication: ACS / STEMI  Patient Measurements: Height: 5\' 8"  (172.7 cm) Weight: 105.3 kg (232 lb 2.3 oz) IBW/kg (Calculated) : 68.4 Heparin Dosing Weight: 91.68 kg  Labs: Recent Labs    08/23/20 0151 08/23/20 0151 08/23/20 0923 08/23/20 1058 08/23/20 1302 08/23/20 1500 08/23/20 1659 08/24/20 0438 08/25/20 0350  HGB 11.7*   < >  --   --   --   --   --  11.6* 11.5*  HCT 35.2*  --   --   --   --   --   --  34.7* 34.2*  PLT 135*  --   --   --   --   --   --  163 174  HEPARINUNFRC 0.36   < > 0.37  --   --   --   --  0.60 0.59  CREATININE 1.59*  --   --   --   --   --   --  1.54* 1.49*  TROPONINIHS  --   --  7,414*   < > 7,228* 6,882* 6,979*  --   --    < > = values in this interval not displayed.    Estimated Creatinine Clearance: 44.2 mL/min (A) (by C-G formula based on SCr of 1.49 mg/dL (H)).   Medical History: Past Medical History:  Diagnosis Date  . Diabetes mellitus without complication ([place])   . Hypertension     Medications:  No PTA anticoagulation - per chart review and verified with ED nurse.   Assessment: Pharmacy has been consulted to monitor heparin dosing in a patient who presented to the ED as post cardiac arrest. Per notes, EMS found patient pulseless and in V-fib. Per ED nurse, patient is intubated.  Baseline CBC appropriate. Baseline aPTT 30s, baseline INR 1.1.   Patient went for cardiac catheterization 10/18. Findings of severe three vessel coronary artery disease. Plavix on hold.   Patient has arrived at [facility], will restart heparin at previous rate.   Goal of Therapy:  Heparin level 0.3-0.7 units/ml Monitor platelets by anticoagulation protocol: Yes   Plan:  --Heparin levels have been within target range x 4 with heparin infusion at rate of 1900 units/hr. --Re-start heparin at 1730 today at previous rate of 1900 units/hr --Heparin level tomorrow AM. Daily CBC per protocol  Erin Hearing PharmD., BCPS Clinical Pharmacist 08/25/2020 3:54 PM

## 2020-08-25 NOTE — Interval H&P Note (Signed)
Cath Lab Visit (complete for each Cath Lab visit)  Clinical Evaluation Leading to the Procedure:   ACS: Yes.    Non-ACS:  n/a   History and Physical Interval Note:  08/25/2020 8:17 AM  [name]  has presented today for surgery, with the diagnosis of nstemi/VF arrest.  The various methods of treatment have been discussed with the patient and family. After consideration of risks, benefits and other options for treatment, the patient has consented to  Procedure(s): LEFT HEART CATH AND CORONARY ANGIOGRAPHY (N/A) as a surgical intervention.  The patient's history has been reviewed, patient examined, no change in status, stable for surgery.  I have reviewed the patient's chart and labs.  Questions were answered to the patient's satisfaction.     [name]

## 2020-08-25 NOTE — Consult Note (Signed)
ANTICOAGULATION CONSULT NOTE  Pharmacy Consult for Heparin  Indication: ACS / STEMI  Patient Measurements: Height: 5\' 8"  (172.7 cm) Weight: 106.1 kg (233 lb 14.4 oz) IBW/kg (Calculated) : 68.4 Heparin Dosing Weight: 91.68 kg  Labs: Recent Labs    08/23/20 0151 08/23/20 0151 08/23/20 0923 08/23/20 1058 08/23/20 1302 08/23/20 1500 08/23/20 1659 08/24/20 0438 08/25/20 0350  HGB 11.7*   < >  --   --   --   --   --  11.6* 11.5*  HCT 35.2*  --   --   --   --   --   --  34.7* 34.2*  PLT 135*  --   --   --   --   --   --  163 174  HEPARINUNFRC 0.36   < > 0.37  --   --   --   --  0.60 0.59  CREATININE 1.59*  --   --   --   --   --   --  1.54* 1.49*  TROPONINIHS  --   --  7,414*   < > 7,228* 6,882* 6,979*  --   --    < > = values in this interval not displayed.    Estimated Creatinine Clearance: 44.4 mL/min (A) (by C-G formula based on SCr of 1.49 mg/dL (H)).   Medical History: Past Medical History:  Diagnosis Date  . Diabetes mellitus without complication ([place])   . Hypertension     Medications:  No PTA anticoagulation - per chart review and verified with ED nurse.   Assessment: Pharmacy has been consulted to monitor heparin dosing in a patient who presented to the ED as post cardiac arrest. Per notes, EMS found patient pulseless and in V-fib. Per ED nurse, patient is intubated.  Baseline CBC appropriate. Baseline aPTT 30s, baseline INR 1.1.   Patient went for cardiac catheterization 10/18. Findings of severe three vessel coronary artery disease. Patient to be transferred to [facility] for CABG evaluation. Plavix on hold.   Heparin drip held peri-procedure. Consult instructions to re-start heparin infusion 8 hours after sheath pull. Per procedure log, sheath was removed 10/18 at 0844. Therefore, heparin drip okay to be re-started at ~1700.   Goal of Therapy:  Heparin level 0.3-0.7 units/ml Monitor platelets by anticoagulation protocol: Yes   Plan:  --Heparin levels  have been within target range x 4 with heparin infusion at rate of 1900 units/hr. --Re-start heparin at 1700 today at previous rate of 1900 units/hr --Heparin level tomorrow AM. Daily CBC per protocol  Benita Gutter 08/25/2020 9:46 AM

## 2020-08-25 NOTE — Consult Note (Signed)
[name] for Heparin  Indication: ACS / STEMI  Allergies  Allergen Reactions  . Metformin Diarrhea   Patient Measurements: Height: 5\' 8"  (172.7 cm) Weight: 105.9 kg (233 lb 8 oz) IBW/kg (Calculated) : 68.4 Heparin Dosing Weight: 104.3 kg (per Care Everywhere documented surgery note on 08/15/2020)  Vital Signs: Temp: 98.4 F (36.9 C) (10/18 0506) Temp Source: Oral (10/17 2019) BP: 121/67 (10/18 0506) Pulse Rate: 53 (10/18 0506)  Labs: Recent Labs    08/23/20 0151 08/23/20 0151 08/23/20 0923 08/23/20 1058 08/23/20 1302 08/23/20 1500 08/23/20 1659 08/24/20 0438 08/25/20 0350  HGB 11.7*   < >  --   --   --   --   --  11.6* 11.5*  HCT 35.2*  --   --   --   --   --   --  34.7* 34.2*  PLT 135*  --   --   --   --   --   --  163 174  HEPARINUNFRC 0.36   < > 0.37  --   --   --   --  0.60 0.59  CREATININE 1.59*  --   --   --   --   --   --  1.54* 1.49*  TROPONINIHS  --   --  7,414*   < > 7,228* 6,882* 6,979*  --   --    < > = values in this interval not displayed.    Estimated Creatinine Clearance: 44.3 mL/min (A) (by C-G formula based on SCr of 1.49 mg/dL (H)).   Medical History: Past Medical History:  Diagnosis Date  . Diabetes mellitus without complication ([place])   . Hypertension     Medications:  No PTA anticoagulation - per chart review and verified with ED nurse.   Assessment: Pharmacy has been consulted to monitor heparin dosing in a patient who presented to the ED as post cardiac arrest. Per notes, EMS found patient pulseless and in V-fib. Per ED nurse, patient is intubated. As such, patient's weight was obtained from chart review of recent weight documented for a surgery. ED nurse will follow up with patient.   Baseline CBC appropriate. CBC stable  10/12 0417 HL 0.59; therapeutic x1 10/12 1149 HL 0.47; therapeutic x2 10/13 0354 HL 0.40; therapeutic x3 10/14 0423 HL 0.18; subtherapeutic, confirmed with nurse no issues  w/ infusion and rate increased to 1400 units/hr 10/14 1418 HL 0.3; therapeutic x1 1014 2226 HL < 0.10, confirmed w/ nurse no issues w/ infusion; 2000 unit bolus and rate increased to 1600 units/hr 1015 0919 HL 0.29; subtherapeutic - 500 unit bolus and rate increased to 1700 units/hr 1015 1802 HL 0.20; subtherapeutic, confirmed with nurse no issues w/ infusion 1016 0151 HL 0.36, therapeutic x 1, CBC stable 10/17 0438 HL 0.60, therapeutic, CBC stable 10/18 0350 HL 0.59, therapeutic, CBC stable  Goal of Therapy:  Heparin level 0.3-0.7 units/ml Monitor platelets by anticoagulation protocol: Yes   Plan:  10/18 0350 HL 0.59; therapeutic x4 - will continue Heparin rate at 1900 units/hr HL/ CBC daily with AM labs  Plan for cardiac cath this admission when renal function allows. Will follow up plan for heparin following cath.  Ena Dawley, PharmD 08/25/2020 5:56 AM

## 2020-08-25 NOTE — Progress Notes (Signed)
[facility] called with bed assignment, [place] at [facility], report called to [place], Therapist, sports. Report given to [facility]link.
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
## 2020-08-25 NOTE — Progress Notes (Signed)
      [address]       [place] [address]             [phone]      Aware of patient , on Plavix  Patient  and wife seen tonight - wanted kids to be involved in decision Patient with complex cad, but many extenuating medical conditions especially his current functional status and mental status after arrest  Have made arrangements with wife to discuss plans tomorrow   Will need rehab consult and screening for rehab prior  to deciding about CABG  Grace Isaac MD      [address] [place] [address] Office [phone]

## 2020-08-25 NOTE — Progress Notes (Signed)
   08/25/20 1200  Clinical Encounter Type  Visited With Patient and family together  Visit Type Initial;Spiritual support;Social support  Referral From Nurse  Consult/Referral To Chaplain  Received call from the nurse. Pt was in distress about the news the Dr had given him. When I entered the room, Pt wife was holding the phone and his daughter-in-law was praying for Pt. after the prayer ended, I talked to Pt and family. Pt is going to be transferred today to [facility]. Ch will follow-up.

## 2020-08-26 ENCOUNTER — Other Ambulatory Visit (HOSPITAL_COMMUNITY): Payer: Medicare HMO

## 2020-08-26 DIAGNOSIS — I5020 Unspecified systolic (congestive) heart failure: Secondary | ICD-10-CM

## 2020-08-26 DIAGNOSIS — I2510 Atherosclerotic heart disease of native coronary artery without angina pectoris: Secondary | ICD-10-CM

## 2020-08-26 DIAGNOSIS — I5022 Chronic systolic (congestive) heart failure: Secondary | ICD-10-CM

## 2020-08-26 DIAGNOSIS — I10 Essential (primary) hypertension: Secondary | ICD-10-CM

## 2020-08-26 DIAGNOSIS — I214 Non-ST elevation (NSTEMI) myocardial infarction: Secondary | ICD-10-CM

## 2020-08-26 DIAGNOSIS — N1832 Chronic kidney disease, stage 3b: Secondary | ICD-10-CM

## 2020-08-26 DIAGNOSIS — E1159 Type 2 diabetes mellitus with other circulatory complications: Secondary | ICD-10-CM

## 2020-08-26 LAB — CBC
HCT: 35 % — ABNORMAL LOW (ref 39.0–52.0)
Hemoglobin: 11.1 g/dL — ABNORMAL LOW (ref 13.0–17.0)
MCH: 28.6 pg (ref 26.0–34.0)
MCHC: 31.7 g/dL (ref 30.0–36.0)
MCV: 90.2 fL (ref 80.0–100.0)
Platelets: 184 10*3/uL (ref 150–400)
RBC: 3.88 MIL/uL — ABNORMAL LOW (ref 4.22–5.81)
RDW: 13 % (ref 11.5–15.5)
WBC: 5.3 10*3/uL (ref 4.0–10.5)
nRBC: 0 % (ref 0.0–0.2)

## 2020-08-26 LAB — HEPARIN LEVEL (UNFRACTIONATED): Heparin Unfractionated: 0.4 IU/mL (ref 0.30–0.70)

## 2020-08-26 LAB — BASIC METABOLIC PANEL
Anion gap: 11 (ref 5–15)
BUN: 17 mg/dL (ref 8–23)
CO2: 22 mmol/L (ref 22–32)
Calcium: 8.5 mg/dL — ABNORMAL LOW (ref 8.9–10.3)
Chloride: 106 mmol/L (ref 98–111)
Creatinine, Ser: 1.57 mg/dL — ABNORMAL HIGH (ref 0.61–1.24)
GFR, Estimated: 40 mL/min — ABNORMAL LOW (ref >60)
Glucose, Bld: 99 mg/dL (ref 70–99)
Potassium: 4.1 mmol/L (ref 3.5–5.1)
Sodium: 139 mmol/L (ref 135–145)

## 2020-08-26 MED ORDER — ROSUVASTATIN CALCIUM 20 MG PO TABS
40.0000 mg | ORAL_TABLET | Freq: Every day | ORAL | Status: DC
  Administered 2020-08-26 – 2020-08-28 (×3): 40 mg via ORAL
  Filled 2020-08-26 (×3): qty 2

## 2020-08-26 MED ORDER — ASPIRIN EC 81 MG PO TBEC
81.0000 mg | DELAYED_RELEASE_TABLET | Freq: Every day | ORAL | Status: DC
  Administered 2020-08-26 – 2020-08-28 (×2): 81 mg via ORAL
  Filled 2020-08-26 (×3): qty 1

## 2020-08-26 NOTE — Plan of Care (Signed)
  Problem: Activity: Goal: Capacity to carry out activities will improve Outcome: Progressing   Problem: Clinical Measurements: Goal: Respiratory complications will improve Outcome: Progressing   Problem: Safety: Goal: Ability to remain free from injury will improve Outcome: Progressing
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
## 2020-08-27 ENCOUNTER — Inpatient Hospital Stay (HOSPITAL_COMMUNITY): Payer: Medicare HMO

## 2020-08-27 DIAGNOSIS — Z01810 Encounter for preprocedural cardiovascular examination: Secondary | ICD-10-CM

## 2020-08-27 DIAGNOSIS — Z8679 Personal history of other diseases of the circulatory system: Secondary | ICD-10-CM

## 2020-08-27 LAB — BLOOD GAS, ARTERIAL
Acid-base deficit: 1.2 mmol/L (ref 0.0–2.0)
Bicarbonate: 22.6 mmol/L (ref 20.0–28.0)
FIO2: 0.5
MECHVT: 500 mL
O2 Saturation: 99.7 %
PEEP: 5 cmH2O
Patient temperature: 37
RATE: 20 resp/min
pCO2 arterial: 34 mmHg (ref 32.0–48.0)
pH, Arterial: 7.43 (ref 7.350–7.450)
pO2, Arterial: 189 mmHg — ABNORMAL HIGH (ref 83.0–108.0)

## 2020-08-27 LAB — LIPID PANEL
Cholesterol: 119 mg/dL (ref 0–200)
HDL: 40 mg/dL — ABNORMAL LOW (ref >40)
LDL Cholesterol: 62 mg/dL (ref 0–99)
Total CHOL/HDL Ratio: 3 RATIO
Triglycerides: 86 mg/dL (ref <150)
VLDL: 17 mg/dL (ref 0–40)

## 2020-08-27 LAB — BRAIN NATRIURETIC PEPTIDE: B Natriuretic Peptide: 861.3 pg/mL — ABNORMAL HIGH (ref 0.0–100.0)

## 2020-08-27 LAB — CBC
HCT: 36.8 % — ABNORMAL LOW (ref 39.0–52.0)
Hemoglobin: 11.7 g/dL — ABNORMAL LOW (ref 13.0–17.0)
MCH: 28.7 pg (ref 26.0–34.0)
MCHC: 31.8 g/dL (ref 30.0–36.0)
MCV: 90.2 fL (ref 80.0–100.0)
Platelets: 204 10*3/uL (ref 150–400)
RBC: 4.08 MIL/uL — ABNORMAL LOW (ref 4.22–5.81)
RDW: 12.8 % (ref 11.5–15.5)
WBC: 5.7 10*3/uL (ref 4.0–10.5)
nRBC: 0 % (ref 0.0–0.2)

## 2020-08-27 LAB — PLATELET INHIBITION P2Y12: Platelet Function  P2Y12: 192 [PRU] (ref 182–335)

## 2020-08-27 LAB — HEPARIN LEVEL (UNFRACTIONATED): Heparin Unfractionated: 0.59 IU/mL (ref 0.30–0.70)

## 2020-08-27 MED ORDER — HYDRALAZINE HCL 25 MG PO TABS
25.0000 mg | ORAL_TABLET | Freq: Three times a day (TID) | ORAL | Status: DC
  Administered 2020-08-27 – 2020-08-28 (×3): 25 mg via ORAL
  Filled 2020-08-27 (×3): qty 1

## 2020-08-27 MED ORDER — BUDESONIDE 0.5 MG/2ML IN SUSP
1.0000 mg | Freq: Two times a day (BID) | RESPIRATORY_TRACT | Status: DC
  Administered 2020-08-27 – 2020-08-30 (×5): 1 mg via RESPIRATORY_TRACT
  Administered 2020-08-30: 0.5 mg via RESPIRATORY_TRACT
  Administered 2020-08-31 – 2020-09-07 (×15): 1 mg via RESPIRATORY_TRACT
  Administered 2020-09-07: 0.5 mg via RESPIRATORY_TRACT
  Administered 2020-09-08: 1 mg via RESPIRATORY_TRACT
  Administered 2020-09-08: 0.5 mg via RESPIRATORY_TRACT
  Administered 2020-09-09 – 2020-09-10 (×3): 1 mg via RESPIRATORY_TRACT
  Filled 2020-08-27 (×28): qty 4

## 2020-08-27 MED ORDER — NITROGLYCERIN IN D5W 200-5 MCG/ML-% IV SOLN
0.0000 ug/min | INTRAVENOUS | Status: DC
  Administered 2020-08-27: 5 ug/min via INTRAVENOUS
  Filled 2020-08-27: qty 250

## 2020-08-27 MED ORDER — FUROSEMIDE 10 MG/ML IJ SOLN
40.0000 mg | Freq: Once | INTRAMUSCULAR | Status: AC
  Administered 2020-08-27: 40 mg via INTRAVENOUS
  Filled 2020-08-27: qty 4

## 2020-08-27 MED ORDER — NITROGLYCERIN 0.4 MG SL SUBL
SUBLINGUAL_TABLET | SUBLINGUAL | Status: AC
  Administered 2020-08-27: 0.4 mg via SUBLINGUAL
  Filled 2020-08-27: qty 1

## 2020-08-27 MED ORDER — FUROSEMIDE 40 MG PO TABS
40.0000 mg | ORAL_TABLET | Freq: Once | ORAL | Status: AC
  Administered 2020-08-27: 40 mg via ORAL
  Filled 2020-08-27: qty 1

## 2020-08-27 MED ORDER — GUAIFENESIN-DM 100-10 MG/5ML PO SYRP
5.0000 mL | ORAL_SOLUTION | ORAL | Status: DC | PRN
  Administered 2020-08-27 – 2020-08-28 (×3): 5 mL via ORAL
  Filled 2020-08-27 (×3): qty 5

## 2020-08-27 MED ORDER — MORPHINE SULFATE (PF) 2 MG/ML IV SOLN: 2.0000 mg | INTRAVENOUS | Status: DC | PRN

## 2020-08-27 MED ORDER — NITROGLYCERIN 0.4 MG SL SUBL
SUBLINGUAL_TABLET | SUBLINGUAL | Status: AC
  Filled 2020-08-27: qty 1
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
## 2020-08-27 NOTE — Progress Notes (Signed)
Called by RN due to CP, increased resp rate.  Will ck CXR, add IV nitro and prn morphine Make sure BMET done, ck BNP  CP has resolved w/ SL ntg and O2.   ECG not read as a STEMI  F/u on above testing, may need Lasix.  Rosaria Ferries, PA-C 08/27/2020 6:44 AM

## 2020-08-27 NOTE — Progress Notes (Signed)
[address]       [place] [address]             [phone]                   Procedure(s) (LRB): CORONARY ARTERY BYPASS GRAFTING (CABG) (N/A) CLIPPING OF ATRIAL APPENDAGE (N/A) TRANSESOPHAGEAL ECHOCARDIOGRAM (TEE) (N/A)  LOS: 2 days   Subjective: Some sob this am, now on nitroglycerin and given iv lasix with improvement  Up to cahir and more talkative   Objective: Vital signs in last 24 hours: Patient Vitals for the past 24 hrs:  BP Temp Temp src Pulse Resp SpO2 Weight  08/27/20 0800 (!) 150/105 98 F (36.7 C) Oral 84 15 100 % --  08/27/20 0630 -- -- -- 95 19 -- --  08/27/20 0625 135/81 -- -- (!) 101 16 95 % --  08/27/20 0337 125/79 98.4 F (36.9 C) -- 60 19 91 % --  08/27/20 0023 125/74 98.6 F (37 C) -- (!) 54 18 95 % 105.7 kg  08/26/20 1921 (!) 129/54 98.4 F (36.9 C) -- -- 16 97 % --    Filed Weights   08/25/20 1455 08/26/20 0300 08/27/20 0023  Weight: 105.3 kg 105.2 kg 105.7 kg    Hemodynamic parameters for last 24 hours:    Intake/Output from previous day: 10/19 0701 - 10/20 0700 In: 877.9 [P.O.:240; I.V.:637.9] Out: 750 [Urine:750] Intake/Output this shift: No intake/output data recorded.  Scheduled Meds: . aspirin EC  81 mg Oral Daily  . budesonide (PULMICORT) nebulizer solution  1 mg Nebulization BID  . hydrALAZINE  25 mg Oral Q8H  . nitroGLYCERIN      . rosuvastatin  40 mg Oral Daily   Continuous Infusions: . heparin 1,900 Units/hr (08/26/20 2303)  . nitroGLYCERIN 10 mcg/min (08/27/20 0806)   PRN Meds:.acetaminophen, morphine injection, nitroGLYCERIN, ondansetron (ZOFRAN) IV  General appearance: alert, cooperative, appears stated age and no distress Neurologic: intact Heart: irregularly irregular rhythm Lungs: diminished breath sounds bibasilar Abdomen: soft, non-tender; bowel sounds normal; no masses,  no organomegaly Extremities: extremities normal, atraumatic, no cyanosis or edema and Homans sign is negative, no  sign of DVT  Lab Results: CBC: Recent Labs    08/26/20 0118 08/27/20 0703  WBC 5.3 5.7  HGB 11.1* 11.7*  HCT 35.0* 36.8*  PLT 184 204   BMET:  Recent Labs    08/25/20 0350 08/26/20 0118  NA 136 139  K 4.0 4.1  CL 104 106  CO2 23 22  GLUCOSE 129* 99  BUN 23 17  CREATININE 1.49* 1.57*  CALCIUM 8.4* 8.5*    PT/INR: No results for input(s): LABPROT, INR in the last 72 hours.   Radiology DG CHEST PORT 1 VIEW  Result Date: 08/27/2020 CLINICAL DATA:  Shortness of breath. EXAM: PORTABLE CHEST 1 VIEW COMPARISON:  08/20/2020. FINDINGS: Cardiomegaly again noted without interim change. Diffuse bilateral interstitial prominence again noted with slight interim progression from prior exam. Small bilateral pleural effusions. No pneumothorax. IMPRESSION: Cardiomegaly again noted. Diffuse bilateral interstitial prominence again noted with slight interim progression from prior exam. Findings suggest progressive interstitial edema. Small bilateral pleural effusions. Electronically Signed   By: Marcello Moores  Register   On: 08/27/2020 07:27   VAS US DOPPLER PRE CABG  Result Date: 08/27/2020 PREOPERATIVE VASCULAR EVALUATION  Indications:      Pre-CABG. Risk Factors:     Hypertension, hyperlipidemia, Diabetes, coronary artery  disease. Comparison Study: No prior studies. Performing Technologist: Darlin Coco  Examination Guidelines: A complete evaluation includes B-mode imaging, spectral Doppler, color Doppler, and power Doppler as needed of all accessible portions of each vessel. Bilateral testing is considered an integral part of a complete examination. Limited examinations for reoccurring indications may be performed as noted.  Right Carotid Findings: +----------+--------+--------+--------+------------+------------------+           PSV cm/sEDV cm/sStenosisDescribe    Comments           +----------+--------+--------+--------+------------+------------------+ CCA Prox  111     14                           intimal thickening +----------+--------+--------+--------+------------+------------------+ CCA Distal99      15                          intimal thickening +----------+--------+--------+--------+------------+------------------+ ICA Prox  68      17      1-39%   heterogenous                   +----------+--------+--------+--------+------------+------------------+ ICA Distal49      18                          tortuous           +----------+--------+--------+--------+------------+------------------+ ECA       111                                                    +----------+--------+--------+--------+------------+------------------+ Portions of this table do not appear on this page. +----------+--------+-------+----------------+------------+           PSV cm/sEDV cmsDescribe        Arm Pressure +----------+--------+-------+----------------+------------+ Subclavian206            Multiphasic, WNL             +----------+--------+-------+----------------+------------+ +---------+--------+--+--------+--+---------+ VertebralPSV cm/s49EDV cm/s18Antegrade +---------+--------+--+--------+--+---------+ Left Carotid Findings: +----------+--------+--------+--------+------------+------------------+           PSV cm/sEDV cm/sStenosisDescribe    Comments           +----------+--------+--------+--------+------------+------------------+ CCA Prox  129     27                          intimal thickening +----------+--------+--------+--------+------------+------------------+ CCA Distal97      20                          intimal thickening +----------+--------+--------+--------+------------+------------------+ ICA Prox  87      22      1-39%   heterogenous                   +----------+--------+--------+--------+------------+------------------+ ICA Distal97      37                                              +----------+--------+--------+--------+------------+------------------+ ECA       199                                                    +----------+--------+--------+--------+------------+------------------+ +----------+--------+--------+----------------+------------+  SubclavianPSV cm/sEDV cm/sDescribe        Arm Pressure +----------+--------+--------+----------------+------------+           177             Multiphasic, WNL             +----------+--------+--------+----------------+------------+ +---------+--------+--+--------+--+---------+ VertebralPSV cm/s55EDV cm/s15Antegrade +---------+--------+--+--------+--+---------+  ABI Findings: +--------+------------------+-----+---------+--------+ Right   Rt Pressure (mmHg)IndexWaveform Comment  +--------+------------------+-----+---------+--------+ [id number]                    triphasic         +--------+------------------+-----+---------+--------+ PTA     147               0.96 triphasic         +--------+------------------+-----+---------+--------+ DP      134               0.88 biphasic          +--------+------------------+-----+---------+--------+ +--------+------------------+-----+---------+-------+ Left    Lt Pressure (mmHg)IndexWaveform Comment +--------+------------------+-----+---------+-------+ [id number]                    triphasic        +--------+------------------+-----+---------+-------+ PTA     158               1.03 triphasic        +--------+------------------+-----+---------+-------+ DP      149               0.97 biphasic         +--------+------------------+-----+---------+-------+ +-------+---------------+----------------+ ABI/TBIToday's ABI/TBIPrevious ABI/TBI +-------+---------------+----------------+ Right  0.96                            +-------+---------------+----------------+ Left   1.03                             +-------+---------------+----------------+  Right Doppler Findings: +--------+--------+-----+---------+--------+ Site    PressureIndexDoppler  Comments +--------+--------+-----+---------+--------+ [id number]          triphasic         +--------+--------+-----+---------+--------+ Radial               triphasic         +--------+--------+-----+---------+--------+ Ulnar                triphasic         +--------+--------+-----+---------+--------+  Left Doppler Findings: +--------+--------+-----+---------+--------+ Site    PressureIndexDoppler  Comments +--------+--------+-----+---------+--------+ [id number]          triphasic         +--------+--------+-----+---------+--------+ Radial               triphasic         +--------+--------+-----+---------+--------+ Ulnar                triphasic         +--------+--------+-----+---------+--------+  Summary: Right Carotid: Velocities in the right ICA are consistent with a 1-39% stenosis. Left Carotid: Velocities in the left ICA are consistent with a 1-39% stenosis. Vertebrals:  Bilateral vertebral arteries demonstrate antegrade flow. Subclavians: Normal flow hemodynamics were seen in bilateral subclavian              arteries. Right ABI: Resting right ankle-brachial index is within normal range. No evidence of significant right lower extremity arterial disease. Left ABI: Resting left ankle-brachial index is within normal range. No evidence of significant left lower extremity arterial disease. Right Upper  Extremity: Doppler waveforms remain within normal limits with right radial compression. Doppler waveforms remain within normal limits with right ulnar compression. Left Upper Extremity: Doppler waveforms remain within normal limits with left radial compression. Doppler waveforms decrease >50% with left ulnar compression.     Preliminary      Assessment/Plan: S/P Procedure(s) (LRB): CORONARY ARTERY BYPASS GRAFTING (CABG)  (N/A) CLIPPING OF ATRIAL APPENDAGE (N/A) TRANSESOPHAGEAL ECHOCARDIOGRAM (TEE) (N/A)  bmet ordered for today not done - so cr unknown p2y12 test done today  192 Considering CABG Friday-    Grace Isaac MD 08/27/2020 3:06 PM

## 2020-08-27 NOTE — Consult Note (Signed)
ANTICOAGULATION CONSULT NOTE  Pharmacy Consult for Heparin  Indication: ACS / STEMI  Patient Measurements: Height: 5\' 8"  (172.7 cm) Weight: 105.7 kg (233 lb) IBW/kg (Calculated) : 68.4 Heparin Dosing Weight: 91.68 kg  Labs: Recent Labs    08/25/20 0350 08/25/20 0350 08/26/20 0118 08/27/20 0703  HGB 11.5*   < > 11.1* 11.7*  HCT 34.2*  --  35.0* 36.8*  PLT 174  --  184 204  HEPARINUNFRC 0.59  --  0.40 0.59  CREATININE 1.49*  --  1.57*  --    < > = values in this interval not displayed.    Estimated Creatinine Clearance: 42 mL/min (A) (by C-G formula based on SCr of 1.57 mg/dL (H)).   Medical History: Past Medical History:  Diagnosis Date  . Diabetes mellitus without complication ([place])   . Hypertension     Assessment: Pharmacy has been consulted to monitor heparin dosing in a patient who presented to the ED as post cardiac arrest. Per notes, EMS found patient pulseless and in V-fib. He went for cardiac catheterization 10/18. Findings of severe three vessel coronary artery disease. Plavix on hold for CABG on 10/22 -heparin level at goal, CBC stable    Goal of Therapy:  Heparin level 0.3-0.7 units/ml Monitor platelets by anticoagulation protocol: Yes   Plan:  -Continue heparin at 1900 units/hr -Daily heparin level and CBC  Hildred Laser, PharmD Clinical Pharmacist **Pharmacist phone directory can now be found on [email or web address] ([email or web address]).  Listed under [place].
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
## 2020-08-27 NOTE — Progress Notes (Signed)
Pt. c/o 10/10 CP/pressure, BP elevated, RR labored at 32, diaphoretic. Nitro given x2. BP/VS now WNL.EKG obtained.  CP resolved. On call for Cardiology paged to make aware.

## 2020-08-28 ENCOUNTER — Inpatient Hospital Stay (HOSPITAL_COMMUNITY): Payer: Medicare HMO

## 2020-08-28 DIAGNOSIS — Z8674 Personal history of sudden cardiac arrest: Secondary | ICD-10-CM

## 2020-08-28 LAB — COMPREHENSIVE METABOLIC PANEL
ALT: 38 U/L (ref 0–44)
AST: 25 U/L (ref 15–41)
Albumin: 2.7 g/dL — ABNORMAL LOW (ref 3.5–5.0)
Alkaline Phosphatase: 59 U/L (ref 38–126)
Anion gap: 10 (ref 5–15)
BUN: 18 mg/dL (ref 8–23)
CO2: 23 mmol/L (ref 22–32)
Calcium: 8.7 mg/dL — ABNORMAL LOW (ref 8.9–10.3)
Chloride: 105 mmol/L (ref 98–111)
Creatinine, Ser: 1.71 mg/dL — ABNORMAL HIGH (ref 0.61–1.24)
GFR, Estimated: 36 mL/min — ABNORMAL LOW (ref >60)
Glucose, Bld: 127 mg/dL — ABNORMAL HIGH (ref 70–99)
Potassium: 3.9 mmol/L (ref 3.5–5.1)
Sodium: 138 mmol/L (ref 135–145)
Total Bilirubin: 0.7 mg/dL (ref 0.3–1.2)
Total Protein: 6.2 g/dL — ABNORMAL LOW (ref 6.5–8.1)

## 2020-08-28 LAB — HEPARIN LEVEL (UNFRACTIONATED): Heparin Unfractionated: 0.66 IU/mL (ref 0.30–0.70)

## 2020-08-28 LAB — CBC
HCT: 38.1 % — ABNORMAL LOW (ref 39.0–52.0)
Hemoglobin: 12.2 g/dL — ABNORMAL LOW (ref 13.0–17.0)
MCH: 28.6 pg (ref 26.0–34.0)
MCHC: 32 g/dL (ref 30.0–36.0)
MCV: 89.4 fL (ref 80.0–100.0)
Platelets: 233 10*3/uL (ref 150–400)
RBC: 4.26 MIL/uL (ref 4.22–5.81)
RDW: 13 % (ref 11.5–15.5)
WBC: 5.3 10*3/uL (ref 4.0–10.5)
nRBC: 0 % (ref 0.0–0.2)

## 2020-08-28 LAB — TYPE AND SCREEN
ABO/RH(D): O POS
Antibody Screen: NEGATIVE

## 2020-08-28 LAB — PULMONARY FUNCTION TEST
FEF 25-75 Pre: 1.59 L/sec
FEF2575-%Pred-Pre: 90 %
FEV1-%Pred-Pre: 59 %
FEV1-Pre: 1.4 L
FEV1FVC-%Pred-Pre: 116 %
FEV6-%Pred-Pre: 52 %
FEV6-Pre: 1.62 L
FEV6FVC-%Pred-Pre: 106 %
FVC-%Pred-Pre: 49 %
FVC-Pre: 1.62 L
Pre FEV1/FVC ratio: 86 %
Pre FEV6/FVC Ratio: 100 %

## 2020-08-28 LAB — PLATELET INHIBITION P2Y12: Platelet Function  P2Y12: 201 [PRU] (ref 182–335)

## 2020-08-28 LAB — PROTIME-INR
INR: 1.1 (ref 0.8–1.2)
Prothrombin Time: 13.4 seconds (ref 11.4–15.2)

## 2020-08-28 MED ORDER — DEXMEDETOMIDINE HCL IN NACL 400 MCG/100ML IV SOLN
0.1000 ug/kg/h | INTRAVENOUS | Status: AC
  Administered 2020-08-29 (×2): .7 ug/kg/h via INTRAVENOUS
  Filled 2020-08-28: qty 100

## 2020-08-28 MED ORDER — PHENYLEPHRINE HCL-NACL 20-0.9 MG/250ML-% IV SOLN
30.0000 ug/min | INTRAVENOUS | Status: AC
  Administered 2020-08-29: 20 ug/min via INTRAVENOUS
  Filled 2020-08-28: qty 250

## 2020-08-28 MED ORDER — CHLORHEXIDINE GLUCONATE CLOTH 2 % EX PADS
6.0000 | MEDICATED_PAD | Freq: Once | CUTANEOUS | Status: AC
  Administered 2020-08-28: 6 via TOPICAL

## 2020-08-28 MED ORDER — TRANEXAMIC ACID 1000 MG/10ML IV SOLN
1.5000 mg/kg/h | INTRAVENOUS | Status: AC
  Administered 2020-08-29 (×2): 1.5 mg/kg/h via INTRAVENOUS
  Filled 2020-08-28: qty 25

## 2020-08-28 MED ORDER — BISACODYL 5 MG PO TBEC
5.0000 mg | DELAYED_RELEASE_TABLET | Freq: Once | ORAL | Status: AC
  Administered 2020-08-28: 5 mg via ORAL
  Filled 2020-08-28: qty 1

## 2020-08-28 MED ORDER — NITROGLYCERIN IN D5W 200-5 MCG/ML-% IV SOLN
2.0000 ug/min | INTRAVENOUS | Status: AC
  Administered 2020-08-29: 10 ug/min via INTRAVENOUS
  Filled 2020-08-28: qty 250

## 2020-08-28 MED ORDER — MILRINONE LACTATE IN DEXTROSE 20-5 MG/100ML-% IV SOLN
0.3000 ug/kg/min | INTRAVENOUS | Status: AC
  Administered 2020-08-29: .3 ug/kg/min via INTRAVENOUS
  Administered 2020-08-29: 5115 ug via INTRAVENOUS
  Filled 2020-08-28: qty 100

## 2020-08-28 MED ORDER — EPINEPHRINE HCL 5 MG/250ML IV SOLN IN NS
0.0000 ug/min | INTRAVENOUS | Status: DC
  Filled 2020-08-28: qty 250

## 2020-08-28 MED ORDER — SODIUM CHLORIDE 0.9 % IV SOLN
750.0000 mg | INTRAVENOUS | Status: AC
  Administered 2020-08-29: 750 mg via INTRAVENOUS
  Filled 2020-08-28: qty 750

## 2020-08-28 MED ORDER — HYDRALAZINE HCL 50 MG PO TABS
50.0000 mg | ORAL_TABLET | Freq: Three times a day (TID) | ORAL | Status: DC
  Administered 2020-08-28 – 2020-08-29 (×3): 50 mg via ORAL
  Filled 2020-08-28 (×3): qty 1

## 2020-08-28 MED ORDER — MAGNESIUM SULFATE 50 % IJ SOLN
40.0000 meq | INTRAMUSCULAR | Status: DC
  Filled 2020-08-28: qty 9.85

## 2020-08-28 MED ORDER — SODIUM CHLORIDE 0.9 % IV SOLN
1.5000 g | INTRAVENOUS | Status: AC
  Administered 2020-08-29: 1.5 g via INTRAVENOUS
  Filled 2020-08-28: qty 1.5

## 2020-08-28 MED ORDER — TRANEXAMIC ACID (OHS) BOLUS VIA INFUSION
15.0000 mg/kg | INTRAVENOUS | Status: AC
  Administered 2020-08-29: 1534.5 mg via INTRAVENOUS
  Filled 2020-08-28: qty 1535

## 2020-08-28 MED ORDER — SODIUM CHLORIDE 0.9 % IV SOLN
INTRAVENOUS | Status: DC
  Filled 2020-08-28: qty 30

## 2020-08-28 MED ORDER — VANCOMYCIN HCL 1500 MG/300ML IV SOLN
1500.0000 mg | INTRAVENOUS | Status: AC
  Administered 2020-08-29: 1500 mg via INTRAVENOUS
  Filled 2020-08-28: qty 300

## 2020-08-28 MED ORDER — METOPROLOL TARTRATE 12.5 MG HALF TABLET
12.5000 mg | ORAL_TABLET | Freq: Once | ORAL | Status: AC
  Administered 2020-08-29: 12.5 mg via ORAL
  Filled 2020-08-28: qty 1

## 2020-08-28 MED ORDER — NOREPINEPHRINE 4 MG/250ML-% IV SOLN
0.0000 ug/min | INTRAVENOUS | Status: AC
  Administered 2020-08-29: 2 ug/min via INTRAVENOUS
  Filled 2020-08-28: qty 250

## 2020-08-28 MED ORDER — INSULIN REGULAR(HUMAN) IN NACL 100-0.9 UT/100ML-% IV SOLN
INTRAVENOUS | Status: AC
  Administered 2020-08-29: 3.2 [IU]/h via INTRAVENOUS
  Filled 2020-08-28: qty 100

## 2020-08-28 MED ORDER — POTASSIUM CHLORIDE 2 MEQ/ML IV SOLN
80.0000 meq | INTRAVENOUS | Status: DC
  Filled 2020-08-28: qty 40

## 2020-08-28 MED ORDER — PLASMA-LYTE 148 IV SOLN
INTRAVENOUS | Status: DC
  Filled 2020-08-28: qty 2.5

## 2020-08-28 MED ORDER — ACETAMINOPHEN-CODEINE #3 300-30 MG PO TABS: 2.0000 | ORAL_TABLET | Freq: Four times a day (QID) | ORAL | Status: DC | PRN

## 2020-08-28 MED ORDER — TEMAZEPAM 15 MG PO CAPS: 15.0000 mg | ORAL_CAPSULE | Freq: Once | ORAL | Status: DC | PRN

## 2020-08-28 MED ORDER — TRANEXAMIC ACID (OHS) PUMP PRIME SOLUTION
2.0000 mg/kg | INTRAVENOUS | Status: DC
  Filled 2020-08-28: qty 2.05

## 2020-08-28 MED ORDER — CHLORHEXIDINE GLUCONATE 0.12 % MT SOLN
15.0000 mL | Freq: Once | OROMUCOSAL | Status: AC
  Administered 2020-08-29: 15 mL via OROMUCOSAL
  Filled 2020-08-28: qty 15

## 2020-08-28 NOTE — Progress Notes (Addendum)
Pt up eating cereal. Began discussing sternal precautions, mobility, and d/c planning. Pt voiced understanding. Left materials to read however pt does not have glasses right now. The education video system is not working right now. Will f/u later when pt done eating for IS and walk. [id number] Yves Dill CES, ACSM 10:20 AM 08/28/2020   Returned and practiced IS, 1250 mL. PT to see later today, pt prefers to wait to walk with them. [id number] [place], ACSM 11:19 AM 08/28/2020
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
## 2020-08-28 NOTE — Plan of Care (Signed)
  Problem: Pain Managment: Goal: General experience of comfort will improve Outcome: Progressing   Problem: Safety: Goal: Ability to remain free from injury will improve Outcome: Progressing   

## 2020-08-28 NOTE — TOC Initial Note (Addendum)
Transition of Care [facility]) - Initial/Assessment Note    Patient Details  Name: [name] MRN: [id number] Date of Birth: [birth year]  Transition of Care [facility]) CM/SW Contact:    Zenon Mayo, RN Phone Number: 08/28/2020, 4:57 PM  Clinical Narrative:                 Patient is from home with wife and two adult daughters, he has a cane and a walker at home. He states he has no issues with getting his medications and he has no issues with transportation.  NCM offered choice for HHPT, he states  [facility] is ok, NCM made referral to [place] with [facility].  She will check and call this NCM back. He is for CABG tomorrow.  Expected Discharge Plan: [place] Barriers to Discharge: Continued Medical Work up   Patient Goals and CMS Choice Patient states their goals for this hospitalization and ongoing recovery are:: get better CMS Medicare.gov Compare Post Acute Care list provided to:: Patient Choice offered to / list presented to : Patient  Expected Discharge Plan and Services Expected Discharge Plan: [place] In-house Referral: NA Discharge Planning Services: CM Consult Post Acute Care Choice: [place] arrangements for the past 2 months: Single Family Home                   DME Agency: NA       HH Arranged: PT HH Agency: [facility] Date [place] Agency Contacted: 08/28/20 Time HH Agency Contacted: 21 Representative spoke with at [place]: Home Arrangements/Services Living arrangements for the past 2 months: [place] with:: Spouse, Adult Children Patient language and need for interpreter reviewed:: Yes        Need for Family Participation in Patient Care: Yes (Comment) Care giver support system in place?: Yes (comment) Current home services: DME (has walker/cane) Criminal Activity/Legal Involvement Pertinent to Current Situation/Hospitalization: No - Comment as needed  Activities of  Daily Living Home Assistive Devices/Equipment: Cane (specify quad or straight) ADL Screening (condition at time of admission) Patient's cognitive ability adequate to safely complete daily activities?: Yes Is the patient deaf or have difficulty hearing?: No Does the patient have difficulty seeing, even when wearing glasses/contacts?: No Does the patient have difficulty concentrating, remembering, or making decisions?: No Patient able to express need for assistance with ADLs?: Yes Does the patient have difficulty dressing or bathing?: No Independently performs ADLs?: Yes (appropriate for developmental age) Does the patient have difficulty walking or climbing stairs?: Yes Weakness of Legs: Both Weakness of Arms/Hands: Both  Permission Sought/Granted                  Emotional Assessment Appearance:: Appears stated age Attitude/Demeanor/Rapport: Engaged Affect (typically observed): Appropriate Orientation: : Oriented to Self, Oriented to Place, Oriented to  Time, Oriented to Situation Alcohol / Substance Use: Not Applicable Psych Involvement: No (comment)  Admission diagnosis:  CAD, multiple vessel [I25.10] Patient Active Problem List   Diagnosis Date Noted  . CAD, multiple vessel 08/25/2020  . Non-ST elevation (NSTEMI) myocardial infarction ([place]) 08/21/2020  . History of cardiac arrest 08/21/2020  . HFrEF (heart failure with reduced ejection fraction) ([place])   . History of sustained ventricular fibrillation 08/18/2020  . Acute respiratory failure with hypoxia ([place])   . Chronic diastolic heart failure ([place])   . Stage 3a chronic kidney disease ([place]) 11/20/2019  . UTI (urinary tract infection) 07/26/2019  .  HTN (hypertension) 07/26/2019  . Diabetes ([place]) 07/26/2019  . Angina pectoris ([place]) 12/03/2008  . Status post percutaneous transluminal coronary angioplasty 10/08/2008  . Malignant neoplasm of prostate ([place]) 10/05/2005  . Essential hypertension 08/24/1989   PCP:   Guadalupe Maple, MD Pharmacy:   [id number] [address], [place] [address] Phone: [phone] Fax: [phone]     Social Determinants of Health (SDOH) Interventions    Readmission Risk Interventions Readmission Risk Prevention Plan 08/28/2020  Transportation Screening Complete  HRI or [place] Complete  Social Work Consult for [place] Planning/Counseling Complete  Palliative Care Screening Not Applicable  Medication Review Press photographer) Complete  Some recent data might be hidden
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
## 2020-08-28 NOTE — Progress Notes (Signed)
Pre Procedure note for inpatients:   [name] has been scheduled for Procedure(s): CORONARY ARTERY BYPASS GRAFTING (CABG) (N/A) CLIPPING OF ATRIAL APPENDAGE (N/A) TRANSESOPHAGEAL ECHOCARDIOGRAM (TEE) (N/A) today. The various methods of treatment have been discussed with the patient. After consideration of the risks, benefits and treatment options the patient has consented to the planned procedure.   The patient has been seen and labs reviewed. There are no changes in the patient's condition to prevent proceeding with the planned procedure today.  Recent labs:  Lab Results  Component Value Date   WBC 5.3 08/28/2020   HGB 12.2 (L) 08/28/2020   HCT 38.1 (L) 08/28/2020   PLT 233 08/28/2020   GLUCOSE 127 (H) 08/28/2020   CHOL 119 08/27/2020   TRIG 86 08/27/2020   HDL 40 (L) 08/27/2020   LDLCALC 62 08/27/2020   ALT 38 08/28/2020   AST 25 08/28/2020   NA 138 08/28/2020   K 3.9 08/28/2020   CL 105 08/28/2020   CREATININE 1.71 (H) 08/28/2020   BUN 18 08/28/2020   CO2 23 08/28/2020   INR 1.1 08/28/2020   HGBA1C 7.8 (H) 08/19/2020   Met with the patient and his wife and daughter today to again reviewed the risks and options of coronary artery bypass surgery as treatment of his severe three-vessel coronary artery disease, in the setting of diabetes and with multiple previous stents.  The circumflex and right coronary artery could potentially be stented again, the proximal LAD and diagonal are subtotally occluded and would likely best be served with mammary artery bypass to the LAD.  The patient's age and preoperative renal function definitely increases the risk, but considering his presentation with out-of-hospital V. fib arrest and acute myocardial infarction with associated risk of bypass surgery seems reasonable.  Patient and his family are agreeable with this.   The goals risks and alternatives of the planned surgical procedure Procedure(s): CORONARY ARTERY BYPASS GRAFTING (CABG)  (N/A) CLIPPING OF ATRIAL APPENDAGE (N/A) TRANSESOPHAGEAL ECHOCARDIOGRAM (TEE) (N/A)  have been discussed with the patient in detail. The risks of the procedure including death, infection, stroke, myocardial infarction, bleeding, blood transfusion, worsening renal function potentially requiring dialysis. have all been discussed specifically.  I have quoted [name] a 5% of perioperative mortality and a complication rate as high as 70 %. The patient's questions have been answered.[name] is willing  to proceed with the planned procedure.  Grace Isaac, MD 08/28/2020 6:08 PM

## 2020-08-29 ENCOUNTER — Inpatient Hospital Stay (HOSPITAL_COMMUNITY): Payer: Medicare HMO | Admitting: Certified Registered"

## 2020-08-29 ENCOUNTER — Inpatient Hospital Stay (HOSPITAL_COMMUNITY)
Admission: AD | Disposition: A | Discharge status: Home Health | Payer: Self-pay | Source: Other Acute Inpatient Hospital | Attending: Cardiothoracic Surgery

## 2020-08-29 ENCOUNTER — Inpatient Hospital Stay (HOSPITAL_COMMUNITY): Payer: Medicare HMO

## 2020-08-29 DIAGNOSIS — Z951 Presence of aortocoronary bypass graft: Secondary | ICD-10-CM

## 2020-08-29 HISTORY — PX: CLIPPING OF ATRIAL APPENDAGE: SHX5773

## 2020-08-29 HISTORY — PX: TEE WITHOUT CARDIOVERSION: SHX5443

## 2020-08-29 HISTORY — PX: CORONARY ARTERY BYPASS GRAFT: SHX141

## 2020-08-29 HISTORY — PX: ENDOVEIN HARVEST OF GREATER SAPHENOUS VEIN: SHX5059

## 2020-08-29 LAB — BASIC METABOLIC PANEL
Anion gap: 10 (ref 5–15)
Anion gap: 11 (ref 5–15)
BUN: 18 mg/dL (ref 8–23)
BUN: 17 mg/dL (ref 8–23)
CO2: 22 mmol/L (ref 22–32)
CO2: 18 mmol/L — ABNORMAL LOW (ref 22–32)
Calcium: 8.6 mg/dL — ABNORMAL LOW (ref 8.9–10.3)
Calcium: 7.6 mg/dL — ABNORMAL LOW (ref 8.9–10.3)
Chloride: 104 mmol/L (ref 98–111)
Chloride: 108 mmol/L (ref 98–111)
Creatinine, Ser: 1.56 mg/dL — ABNORMAL HIGH (ref 0.61–1.24)
Creatinine, Ser: 1.7 mg/dL — ABNORMAL HIGH (ref 0.61–1.24)
GFR, Estimated: 44 mL/min — ABNORMAL LOW (ref >60)
GFR, Estimated: 40 mL/min — ABNORMAL LOW (ref >60)
Glucose, Bld: 130 mg/dL — ABNORMAL HIGH (ref 70–99)
Glucose, Bld: 151 mg/dL — ABNORMAL HIGH (ref 70–99)
Potassium: 3.6 mmol/L (ref 3.5–5.1)
Potassium: 3.9 mmol/L (ref 3.5–5.1)
Sodium: 136 mmol/L (ref 135–145)
Sodium: 137 mmol/L (ref 135–145)

## 2020-08-29 LAB — POCT I-STAT 7, (LYTES, BLD GAS, ICA,H+H)
Acid-Base Excess: 1 mmol/L (ref 0.0–2.0)
Acid-Base Excess: 1 mmol/L (ref 0.0–2.0)
Acid-base deficit: 2 mmol/L (ref 0.0–2.0)
Acid-base deficit: 2 mmol/L (ref 0.0–2.0)
Acid-base deficit: 4 mmol/L — ABNORMAL HIGH (ref 0.0–2.0)
Acid-base deficit: 4 mmol/L — ABNORMAL HIGH (ref 0.0–2.0)
Acid-base deficit: 6 mmol/L — ABNORMAL HIGH (ref 0.0–2.0)
Bicarbonate: 24.6 mmol/L (ref 20.0–28.0)
Bicarbonate: 25.8 mmol/L (ref 20.0–28.0)
Bicarbonate: 25 mmol/L (ref 20.0–28.0)
Bicarbonate: 24.3 mmol/L (ref 20.0–28.0)
Bicarbonate: 22.7 mmol/L (ref 20.0–28.0)
Bicarbonate: 21.7 mmol/L (ref 20.0–28.0)
Bicarbonate: 19.7 mmol/L — ABNORMAL LOW (ref 20.0–28.0)
Calcium, Ion: 1.22 mmol/L (ref 1.15–1.40)
Calcium, Ion: 1.07 mmol/L — ABNORMAL LOW (ref 1.15–1.40)
Calcium, Ion: 1.11 mmol/L — ABNORMAL LOW (ref 1.15–1.40)
Calcium, Ion: 1.09 mmol/L — ABNORMAL LOW (ref 1.15–1.40)
Calcium, Ion: 1.11 mmol/L — ABNORMAL LOW (ref 1.15–1.40)
Calcium, Ion: 1.11 mmol/L — ABNORMAL LOW (ref 1.15–1.40)
Calcium, Ion: 1.15 mmol/L (ref 1.15–1.40)
HCT: 33 % — ABNORMAL LOW (ref 39.0–52.0)
HCT: 27 % — ABNORMAL LOW (ref 39.0–52.0)
HCT: 28 % — ABNORMAL LOW (ref 39.0–52.0)
HCT: 28 % — ABNORMAL LOW (ref 39.0–52.0)
HCT: 31 % — ABNORMAL LOW (ref 39.0–52.0)
HCT: 29 % — ABNORMAL LOW (ref 39.0–52.0)
HCT: 28 % — ABNORMAL LOW (ref 39.0–52.0)
Hemoglobin: 11.2 g/dL — ABNORMAL LOW (ref 13.0–17.0)
Hemoglobin: 9.2 g/dL — ABNORMAL LOW (ref 13.0–17.0)
Hemoglobin: 9.5 g/dL — ABNORMAL LOW (ref 13.0–17.0)
Hemoglobin: 9.5 g/dL — ABNORMAL LOW (ref 13.0–17.0)
Hemoglobin: 10.5 g/dL — ABNORMAL LOW (ref 13.0–17.0)
Hemoglobin: 9.9 g/dL — ABNORMAL LOW (ref 13.0–17.0)
Hemoglobin: 9.5 g/dL — ABNORMAL LOW (ref 13.0–17.0)
O2 Saturation: 100 %
O2 Saturation: 100 %
O2 Saturation: 100 %
O2 Saturation: 100 %
O2 Saturation: 90 %
O2 Saturation: 96 %
O2 Saturation: 90 %
Patient temperature: 35.9
Patient temperature: 35.8
Patient temperature: 35.7
Potassium: 3.6 mmol/L (ref 3.5–5.1)
Potassium: 3.7 mmol/L (ref 3.5–5.1)
Potassium: 4.2 mmol/L (ref 3.5–5.1)
Potassium: 3.8 mmol/L (ref 3.5–5.1)
Potassium: 3.9 mmol/L (ref 3.5–5.1)
Potassium: 3.9 mmol/L (ref 3.5–5.1)
Potassium: 3.9 mmol/L (ref 3.5–5.1)
Sodium: 139 mmol/L (ref 135–145)
Sodium: 140 mmol/L (ref 135–145)
Sodium: 138 mmol/L (ref 135–145)
Sodium: 140 mmol/L (ref 135–145)
Sodium: 141 mmol/L (ref 135–145)
Sodium: 141 mmol/L (ref 135–145)
Sodium: 140 mmol/L (ref 135–145)
TCO2: 26 mmol/L (ref 22–32)
TCO2: 27 mmol/L (ref 22–32)
TCO2: 26 mmol/L (ref 22–32)
TCO2: 26 mmol/L (ref 22–32)
TCO2: 24 mmol/L (ref 22–32)
TCO2: 23 mmol/L (ref 22–32)
TCO2: 21 mmol/L — ABNORMAL LOW (ref 22–32)
pCO2 arterial: 48.2 mmHg — ABNORMAL HIGH (ref 32.0–48.0)
pCO2 arterial: 41.3 mmHg (ref 32.0–48.0)
pCO2 arterial: 38.6 mmHg (ref 32.0–48.0)
pCO2 arterial: 49.3 mmHg — ABNORMAL HIGH (ref 32.0–48.0)
pCO2 arterial: 45 mmHg (ref 32.0–48.0)
pCO2 arterial: 39.1 mmHg (ref 32.0–48.0)
pCO2 arterial: 35.8 mmHg (ref 32.0–48.0)
pH, Arterial: 7.315 — ABNORMAL LOW (ref 7.350–7.450)
pH, Arterial: 7.404 (ref 7.350–7.450)
pH, Arterial: 7.42 (ref 7.350–7.450)
pH, Arterial: 7.301 — ABNORMAL LOW (ref 7.350–7.450)
pH, Arterial: 7.306 — ABNORMAL LOW (ref 7.350–7.450)
pH, Arterial: 7.346 — ABNORMAL LOW (ref 7.350–7.450)
pH, Arterial: 7.342 — ABNORMAL LOW (ref 7.350–7.450)
pO2, Arterial: 240 mmHg — ABNORMAL HIGH (ref 83.0–108.0)
pO2, Arterial: 316 mmHg — ABNORMAL HIGH (ref 83.0–108.0)
pO2, Arterial: 353 mmHg — ABNORMAL HIGH (ref 83.0–108.0)
pO2, Arterial: 215 mmHg — ABNORMAL HIGH (ref 83.0–108.0)
pO2, Arterial: 62 mmHg — ABNORMAL LOW (ref 83.0–108.0)
pO2, Arterial: 78 mmHg — ABNORMAL LOW (ref 83.0–108.0)
pO2, Arterial: 57 mmHg — ABNORMAL LOW (ref 83.0–108.0)

## 2020-08-29 LAB — POCT I-STAT, CHEM 8
BUN: 18 mg/dL (ref 8–23)
BUN: 17 mg/dL (ref 8–23)
BUN: 16 mg/dL (ref 8–23)
BUN: 17 mg/dL (ref 8–23)
BUN: 17 mg/dL (ref 8–23)
BUN: 17 mg/dL (ref 8–23)
Calcium, Ion: 1.22 mmol/L (ref 1.15–1.40)
Calcium, Ion: 1.21 mmol/L (ref 1.15–1.40)
Calcium, Ion: 1.11 mmol/L — ABNORMAL LOW (ref 1.15–1.40)
Calcium, Ion: 1.14 mmol/L — ABNORMAL LOW (ref 1.15–1.40)
Calcium, Ion: 1.15 mmol/L (ref 1.15–1.40)
Calcium, Ion: 1.12 mmol/L — ABNORMAL LOW (ref 1.15–1.40)
Chloride: 101 mmol/L (ref 98–111)
Chloride: 103 mmol/L (ref 98–111)
Chloride: 100 mmol/L (ref 98–111)
Chloride: 102 mmol/L (ref 98–111)
Chloride: 102 mmol/L (ref 98–111)
Chloride: 103 mmol/L (ref 98–111)
Creatinine, Ser: 1.5 mg/dL — ABNORMAL HIGH (ref 0.61–1.24)
Creatinine, Ser: 1.3 mg/dL — ABNORMAL HIGH (ref 0.61–1.24)
Creatinine, Ser: 1.2 mg/dL (ref 0.61–1.24)
Creatinine, Ser: 1.2 mg/dL (ref 0.61–1.24)
Creatinine, Ser: 1.3 mg/dL — ABNORMAL HIGH (ref 0.61–1.24)
Creatinine, Ser: 1.3 mg/dL — ABNORMAL HIGH (ref 0.61–1.24)
Glucose, Bld: 150 mg/dL — ABNORMAL HIGH (ref 70–99)
Glucose, Bld: 123 mg/dL — ABNORMAL HIGH (ref 70–99)
Glucose, Bld: 117 mg/dL — ABNORMAL HIGH (ref 70–99)
Glucose, Bld: 146 mg/dL — ABNORMAL HIGH (ref 70–99)
Glucose, Bld: 165 mg/dL — ABNORMAL HIGH (ref 70–99)
Glucose, Bld: 147 mg/dL — ABNORMAL HIGH (ref 70–99)
HCT: 28 % — ABNORMAL LOW (ref 39.0–52.0)
HCT: 33 % — ABNORMAL LOW (ref 39.0–52.0)
HCT: 28 % — ABNORMAL LOW (ref 39.0–52.0)
HCT: 28 % — ABNORMAL LOW (ref 39.0–52.0)
HCT: 30 % — ABNORMAL LOW (ref 39.0–52.0)
HCT: 27 % — ABNORMAL LOW (ref 39.0–52.0)
Hemoglobin: 9.5 g/dL — ABNORMAL LOW (ref 13.0–17.0)
Hemoglobin: 11.2 g/dL — ABNORMAL LOW (ref 13.0–17.0)
Hemoglobin: 9.5 g/dL — ABNORMAL LOW (ref 13.0–17.0)
Hemoglobin: 9.5 g/dL — ABNORMAL LOW (ref 13.0–17.0)
Hemoglobin: 10.2 g/dL — ABNORMAL LOW (ref 13.0–17.0)
Hemoglobin: 9.2 g/dL — ABNORMAL LOW (ref 13.0–17.0)
Potassium: 3.6 mmol/L (ref 3.5–5.1)
Potassium: 3.6 mmol/L (ref 3.5–5.1)
Potassium: 3.6 mmol/L (ref 3.5–5.1)
Potassium: 3.8 mmol/L (ref 3.5–5.1)
Potassium: 4.2 mmol/L (ref 3.5–5.1)
Potassium: 3.7 mmol/L (ref 3.5–5.1)
Sodium: 139 mmol/L (ref 135–145)
Sodium: 138 mmol/L (ref 135–145)
Sodium: 140 mmol/L (ref 135–145)
Sodium: 139 mmol/L (ref 135–145)
Sodium: 138 mmol/L (ref 135–145)
Sodium: 140 mmol/L (ref 135–145)
TCO2: 26 mmol/L (ref 22–32)
TCO2: 22 mmol/L (ref 22–32)
TCO2: 24 mmol/L (ref 22–32)
TCO2: 26 mmol/L (ref 22–32)
TCO2: 25 mmol/L (ref 22–32)
TCO2: 24 mmol/L (ref 22–32)

## 2020-08-29 LAB — CBC
HCT: 36.9 % — ABNORMAL LOW (ref 39.0–52.0)
HCT: 32.8 % — ABNORMAL LOW (ref 39.0–52.0)
HCT: 30.4 % — ABNORMAL LOW (ref 39.0–52.0)
Hemoglobin: 11.8 g/dL — ABNORMAL LOW (ref 13.0–17.0)
Hemoglobin: 10.5 g/dL — ABNORMAL LOW (ref 13.0–17.0)
Hemoglobin: 9.8 g/dL — ABNORMAL LOW (ref 13.0–17.0)
MCH: 28.4 pg (ref 26.0–34.0)
MCH: 29.1 pg (ref 26.0–34.0)
MCH: 29.5 pg (ref 26.0–34.0)
MCHC: 32 g/dL (ref 30.0–36.0)
MCHC: 32 g/dL (ref 30.0–36.0)
MCHC: 32.2 g/dL (ref 30.0–36.0)
MCV: 88.9 fL (ref 80.0–100.0)
MCV: 90.9 fL (ref 80.0–100.0)
MCV: 91.6 fL (ref 80.0–100.0)
Platelets: 207 10*3/uL (ref 150–400)
Platelets: 176 10*3/uL (ref 150–400)
Platelets: 180 10*3/uL (ref 150–400)
RBC: 4.15 MIL/uL — ABNORMAL LOW (ref 4.22–5.81)
RBC: 3.61 MIL/uL — ABNORMAL LOW (ref 4.22–5.81)
RBC: 3.32 MIL/uL — ABNORMAL LOW (ref 4.22–5.81)
RDW: 13.1 % (ref 11.5–15.5)
RDW: 13.1 % (ref 11.5–15.5)
RDW: 13.2 % (ref 11.5–15.5)
WBC: 5.3 10*3/uL (ref 4.0–10.5)
WBC: 18.1 10*3/uL — ABNORMAL HIGH (ref 4.0–10.5)
WBC: 16.9 10*3/uL — ABNORMAL HIGH (ref 4.0–10.5)
nRBC: 0 % (ref 0.0–0.2)
nRBC: 0 % (ref 0.0–0.2)
nRBC: 0 % (ref 0.0–0.2)

## 2020-08-29 LAB — POCT I-STAT EG7
Acid-base deficit: 1 mmol/L (ref 0.0–2.0)
Bicarbonate: 24.1 mmol/L (ref 20.0–28.0)
Calcium, Ion: 1.05 mmol/L — ABNORMAL LOW (ref 1.15–1.40)
HCT: 27 % — ABNORMAL LOW (ref 39.0–52.0)
Hemoglobin: 9.2 g/dL — ABNORMAL LOW (ref 13.0–17.0)
O2 Saturation: 85 %
Potassium: 3.7 mmol/L (ref 3.5–5.1)
Sodium: 140 mmol/L (ref 135–145)
TCO2: 25 mmol/L (ref 22–32)
pCO2, Ven: 41.7 mmHg — ABNORMAL LOW (ref 44.0–60.0)
pH, Ven: 7.37 (ref 7.250–7.430)
pO2, Ven: 52 mmHg — ABNORMAL HIGH (ref 32.0–45.0)

## 2020-08-29 LAB — HEPARIN LEVEL (UNFRACTIONATED): Heparin Unfractionated: 0.69 IU/mL (ref 0.30–0.70)

## 2020-08-29 LAB — MAGNESIUM: Magnesium: 2.3 mg/dL (ref 1.7–2.4)

## 2020-08-29 LAB — SURGICAL PCR SCREEN
MRSA, PCR: NEGATIVE
Staphylococcus aureus: NEGATIVE

## 2020-08-29 LAB — HEMOGLOBIN AND HEMATOCRIT, BLOOD
HCT: 28.6 % — ABNORMAL LOW (ref 39.0–52.0)
Hemoglobin: 9.3 g/dL — ABNORMAL LOW (ref 13.0–17.0)

## 2020-08-29 LAB — APTT: aPTT: 29 seconds (ref 24–36)

## 2020-08-29 LAB — GLUCOSE, CAPILLARY
Glucose-Capillary: 136 mg/dL — ABNORMAL HIGH (ref 70–99)
Glucose-Capillary: 118 mg/dL — ABNORMAL HIGH (ref 70–99)
Glucose-Capillary: 117 mg/dL — ABNORMAL HIGH (ref 70–99)
Glucose-Capillary: 119 mg/dL — ABNORMAL HIGH (ref 70–99)
Glucose-Capillary: 140 mg/dL — ABNORMAL HIGH (ref 70–99)
Glucose-Capillary: 151 mg/dL — ABNORMAL HIGH (ref 70–99)
Glucose-Capillary: 148 mg/dL — ABNORMAL HIGH (ref 70–99)

## 2020-08-29 LAB — ECHO INTRAOPERATIVE TEE
Height: 68 in
Weight: 3809.55 oz

## 2020-08-29 LAB — PROTIME-INR
INR: 1.4 — ABNORMAL HIGH (ref 0.8–1.2)
Prothrombin Time: 16.7 seconds — ABNORMAL HIGH (ref 11.4–15.2)

## 2020-08-29 LAB — PLATELET COUNT: Platelets: 162 10*3/uL (ref 150–400)

## 2020-08-29 SURGERY — CORONARY ARTERY BYPASS GRAFTING (CABG)
Anesthesia: General | Site: Chest | Laterality: Right

## 2020-08-29 MED ORDER — METOPROLOL TARTRATE 12.5 MG HALF TABLET: 12.5000 mg | ORAL_TABLET | Freq: Two times a day (BID) | ORAL | Status: DC

## 2020-08-29 MED ORDER — ROCURONIUM BROMIDE 10 MG/ML (PF) SYRINGE
PREFILLED_SYRINGE | INTRAVENOUS | Status: DC | PRN
  Administered 2020-08-29 (×2): 50 mg via INTRAVENOUS

## 2020-08-29 MED ORDER — DEXMEDETOMIDINE HCL IN NACL 400 MCG/100ML IV SOLN: 0.0000 ug/kg/h | INTRAVENOUS | Status: DC

## 2020-08-29 MED ORDER — PHENYLEPHRINE 40 MCG/ML (10ML) SYRINGE FOR IV PUSH (FOR BLOOD PRESSURE SUPPORT)
PREFILLED_SYRINGE | INTRAVENOUS | Status: DC | PRN
  Administered 2020-08-29: 200 ug via INTRAVENOUS

## 2020-08-29 MED ORDER — MIDAZOLAM HCL (PF) 10 MG/2ML IJ SOLN
INTRAMUSCULAR | Status: AC
  Filled 2020-08-29: qty 2

## 2020-08-29 MED ORDER — METOPROLOL TARTRATE 5 MG/5ML IV SOLN: 2.5000 mg | INTRAVENOUS | Status: DC | PRN

## 2020-08-29 MED ORDER — SODIUM CHLORIDE 0.9% FLUSH
10.0000 mL | Freq: Two times a day (BID) | INTRAVENOUS | Status: DC
  Administered 2020-08-30 – 2020-09-10 (×18): 10 mL

## 2020-08-29 MED ORDER — VANCOMYCIN HCL IN DEXTROSE 1-5 GM/200ML-% IV SOLN
1000.0000 mg | Freq: Once | INTRAVENOUS | Status: AC
  Administered 2020-08-29: 1000 mg via INTRAVENOUS
  Filled 2020-08-29: qty 200

## 2020-08-29 MED ORDER — MILRINONE LACTATE IN DEXTROSE 20-5 MG/100ML-% IV SOLN
0.1250 ug/kg/min | INTRAVENOUS | Status: DC
  Administered 2020-08-30: 0.25 ug/kg/min via INTRAVENOUS
  Filled 2020-08-29: qty 100

## 2020-08-29 MED ORDER — METOPROLOL TARTRATE 25 MG/10 ML ORAL SUSPENSION: 12.5000 mg | Freq: Two times a day (BID) | ORAL | Status: DC

## 2020-08-29 MED ORDER — ROCURONIUM BROMIDE 10 MG/ML (PF) SYRINGE
PREFILLED_SYRINGE | INTRAVENOUS | Status: AC
  Filled 2020-08-29: qty 10

## 2020-08-29 MED ORDER — MAGNESIUM SULFATE 2 GM/50ML IV SOLN
2.0000 g | Freq: Once | INTRAVENOUS | Status: AC
  Administered 2020-08-29: 2 g via INTRAVENOUS
  Filled 2020-08-29: qty 50

## 2020-08-29 MED ORDER — ARTIFICIAL TEARS OPHTHALMIC OINT
TOPICAL_OINTMENT | OPHTHALMIC | Status: DC | PRN
  Administered 2020-08-29: 1 via OPHTHALMIC

## 2020-08-29 MED ORDER — DOPAMINE-DEXTROSE 3.2-5 MG/ML-% IV SOLN
INTRAVENOUS | Status: DC | PRN
  Administered 2020-08-29: 2.5 ug/kg/min via INTRAVENOUS

## 2020-08-29 MED ORDER — NITROGLYCERIN IN D5W 200-5 MCG/ML-% IV SOLN: 0.0000 ug/min | INTRAVENOUS | Status: DC

## 2020-08-29 MED ORDER — LIDOCAINE 2% (20 MG/ML) 5 ML SYRINGE
INTRAMUSCULAR | Status: AC
  Filled 2020-08-29: qty 5

## 2020-08-29 MED ORDER — MIDAZOLAM HCL 5 MG/5ML IJ SOLN
INTRAMUSCULAR | Status: DC | PRN
  Administered 2020-08-29: 5 mg via INTRAVENOUS
  Administered 2020-08-29: 1 mg via INTRAVENOUS
  Administered 2020-08-29: 4 mg via INTRAVENOUS

## 2020-08-29 MED ORDER — ACETAMINOPHEN 160 MG/5ML PO SOLN: 650.0000 mg | Freq: Once | ORAL | Status: AC

## 2020-08-29 MED ORDER — DOCUSATE SODIUM 100 MG PO CAPS
200.0000 mg | ORAL_CAPSULE | Freq: Every day | ORAL | Status: DC
  Administered 2020-08-30 – 2020-09-10 (×11): 200 mg via ORAL
  Filled 2020-08-29 (×11): qty 2

## 2020-08-29 MED ORDER — PANTOPRAZOLE SODIUM 40 MG PO TBEC
40.0000 mg | DELAYED_RELEASE_TABLET | Freq: Every day | ORAL | Status: DC
  Administered 2020-08-31 – 2020-09-04 (×5): 40 mg via ORAL
  Filled 2020-08-29 (×5): qty 1

## 2020-08-29 MED ORDER — ACETAMINOPHEN 500 MG PO TABS
1000.0000 mg | ORAL_TABLET | Freq: Four times a day (QID) | ORAL | Status: AC
  Administered 2020-08-30 – 2020-09-03 (×16): 1000 mg via ORAL
  Filled 2020-08-29 (×16): qty 2

## 2020-08-29 MED ORDER — LACTATED RINGERS IV SOLN: INTRAVENOUS | Status: DC

## 2020-08-29 MED ORDER — PLASMA-LYTE 148 IV SOLN
INTRAVENOUS | Status: DC | PRN
  Administered 2020-08-29: 500 mL via INTRAVASCULAR

## 2020-08-29 MED ORDER — MAGNESIUM SULFATE 4 GM/100ML IV SOLN: 4.0000 g | Freq: Once | INTRAVENOUS | Status: DC

## 2020-08-29 MED ORDER — CHLORHEXIDINE GLUCONATE CLOTH 2 % EX PADS
6.0000 | MEDICATED_PAD | Freq: Every day | CUTANEOUS | Status: DC
  Administered 2020-08-29 – 2020-08-30 (×2): 6 via TOPICAL

## 2020-08-29 MED ORDER — MILRINONE LACTATE IN DEXTROSE 20-5 MG/100ML-% IV SOLN
0.3000 ug/kg/min | INTRAVENOUS | Status: DC
  Administered 2020-08-29: 0.3 ug/kg/min via INTRAVENOUS
  Filled 2020-08-29: qty 100

## 2020-08-29 MED ORDER — PROPOFOL 10 MG/ML IV BOLUS
INTRAVENOUS | Status: AC
  Filled 2020-08-29: qty 20

## 2020-08-29 MED ORDER — PHENYLEPHRINE HCL-NACL 20-0.9 MG/250ML-% IV SOLN: 0.0000 ug/min | INTRAVENOUS | Status: DC

## 2020-08-29 MED ORDER — SODIUM CHLORIDE 0.45 % IV SOLN: INTRAVENOUS | Status: DC | PRN

## 2020-08-29 MED ORDER — BISACODYL 5 MG PO TBEC
10.0000 mg | DELAYED_RELEASE_TABLET | Freq: Every day | ORAL | Status: DC
  Administered 2020-08-30 – 2020-09-04 (×4): 10 mg via ORAL
  Filled 2020-08-29 (×4): qty 2

## 2020-08-29 MED ORDER — TRAMADOL HCL 50 MG PO TABS: 50.0000 mg | ORAL_TABLET | ORAL | Status: DC | PRN

## 2020-08-29 MED ORDER — BISACODYL 10 MG RE SUPP: 10.0000 mg | Freq: Every day | RECTAL | Status: DC

## 2020-08-29 MED ORDER — LACTATED RINGERS IV SOLN: 500.0000 mL | Freq: Once | INTRAVENOUS | Status: DC | PRN

## 2020-08-29 MED ORDER — DOPAMINE-DEXTROSE 3.2-5 MG/ML-% IV SOLN
2.0000 ug/kg/min | INTRAVENOUS | Status: AC
  Administered 2020-08-31 – 2020-09-02 (×2): 2.5 ug/kg/min via INTRAVENOUS
  Filled 2020-08-29 (×3): qty 250

## 2020-08-29 MED ORDER — ASPIRIN EC 325 MG PO TBEC
325.0000 mg | DELAYED_RELEASE_TABLET | Freq: Every day | ORAL | Status: DC
  Administered 2020-08-30 – 2020-09-04 (×6): 325 mg via ORAL
  Filled 2020-08-29 (×6): qty 1

## 2020-08-29 MED ORDER — OXYCODONE HCL 5 MG PO TABS
5.0000 mg | ORAL_TABLET | ORAL | Status: DC | PRN
  Administered 2020-08-30: 10 mg via ORAL
  Administered 2020-08-31 – 2020-09-01 (×2): 5 mg via ORAL
  Filled 2020-08-29: qty 2
  Filled 2020-08-29 (×2): qty 1

## 2020-08-29 MED ORDER — HEPARIN SODIUM (PORCINE) 1000 UNIT/ML IJ SOLN
INTRAMUSCULAR | Status: DC | PRN
  Administered 2020-08-29: 38000 [IU] via INTRAVENOUS

## 2020-08-29 MED ORDER — SODIUM CHLORIDE 0.9% FLUSH: 3.0000 mL | INTRAVENOUS | Status: DC | PRN

## 2020-08-29 MED ORDER — SODIUM CHLORIDE 0.9 % IV SOLN: INTRAVENOUS | Status: DC

## 2020-08-29 MED ORDER — INSULIN REGULAR(HUMAN) IN NACL 100-0.9 UT/100ML-% IV SOLN: INTRAVENOUS | Status: DC

## 2020-08-29 MED ORDER — NOREPINEPHRINE 4 MG/250ML-% IV SOLN
0.0000 ug/min | INTRAVENOUS | Status: DC
  Administered 2020-08-29: 10 ug/min via INTRAVENOUS
  Filled 2020-08-29: qty 250

## 2020-08-29 MED ORDER — MIDAZOLAM HCL 2 MG/2ML IJ SOLN
2.0000 mg | INTRAMUSCULAR | Status: DC | PRN
  Filled 2020-08-29: qty 2

## 2020-08-29 MED ORDER — ACETAMINOPHEN 650 MG RE SUPP
650.0000 mg | Freq: Once | RECTAL | Status: AC
  Administered 2020-08-29: 650 mg via RECTAL

## 2020-08-29 MED ORDER — VECURONIUM BROMIDE 10 MG IV SOLR
INTRAVENOUS | Status: DC | PRN
  Administered 2020-08-29 (×2): 10 mg via INTRAVENOUS

## 2020-08-29 MED ORDER — POTASSIUM CHLORIDE 10 MEQ/50ML IV SOLN: 10.0000 meq | INTRAVENOUS | Status: AC

## 2020-08-29 MED ORDER — FENTANYL CITRATE (PF) 250 MCG/5ML IJ SOLN
INTRAMUSCULAR | Status: AC
  Filled 2020-08-29: qty 30

## 2020-08-29 MED ORDER — EPHEDRINE 5 MG/ML INJ
INTRAVENOUS | Status: AC
  Filled 2020-08-29: qty 10

## 2020-08-29 MED ORDER — NOREPINEPHRINE 4 MG/250ML-% IV SOLN
4.0000 ug/min | INTRAVENOUS | Status: AC
  Administered 2020-08-30: 9.013 ug/min via INTRAVENOUS
  Filled 2020-08-29: qty 250

## 2020-08-29 MED ORDER — LACTATED RINGERS IV SOLN: INTRAVENOUS | Status: DC | PRN

## 2020-08-29 MED ORDER — SODIUM CHLORIDE 0.9% FLUSH
3.0000 mL | Freq: Two times a day (BID) | INTRAVENOUS | Status: DC
  Administered 2020-08-29 – 2020-09-04 (×11): 3 mL via INTRAVENOUS

## 2020-08-29 MED ORDER — VECURONIUM BROMIDE 10 MG IV SOLR
INTRAVENOUS | Status: AC
  Filled 2020-08-29: qty 20

## 2020-08-29 MED ORDER — SODIUM CHLORIDE 0.9 % IV SOLN
1.5000 g | Freq: Two times a day (BID) | INTRAVENOUS | Status: AC
  Administered 2020-08-29 – 2020-08-31 (×4): 1.5 g via INTRAVENOUS
  Filled 2020-08-29 (×4): qty 1.5

## 2020-08-29 MED ORDER — ALBUMIN HUMAN 5 % IV SOLN: INTRAVENOUS | Status: DC | PRN

## 2020-08-29 MED ORDER — ASPIRIN 81 MG PO CHEW: 324.0000 mg | CHEWABLE_TABLET | Freq: Every day | ORAL | Status: DC

## 2020-08-29 MED ORDER — EPHEDRINE SULFATE-NACL 50-0.9 MG/10ML-% IV SOSY
PREFILLED_SYRINGE | INTRAVENOUS | Status: DC | PRN
  Administered 2020-08-29: 20 mg via INTRAVENOUS
  Administered 2020-08-29: 10 mg via INTRAVENOUS

## 2020-08-29 MED ORDER — ALBUMIN HUMAN 5 % IV SOLN
250.0000 mL | INTRAVENOUS | Status: AC | PRN
  Administered 2020-08-29 – 2020-08-30 (×4): 12.5 g via INTRAVENOUS
  Filled 2020-08-29 (×2): qty 250

## 2020-08-29 MED ORDER — SODIUM CHLORIDE (PF) 0.9 % IJ SOLN
OROMUCOSAL | Status: DC | PRN
  Administered 2020-08-29 (×3): 4 mL via TOPICAL

## 2020-08-29 MED ORDER — ONDANSETRON HCL 4 MG/2ML IJ SOLN
4.0000 mg | Freq: Four times a day (QID) | INTRAMUSCULAR | Status: DC | PRN
  Administered 2020-08-30 – 2020-09-01 (×3): 4 mg via INTRAVENOUS
  Filled 2020-08-29 (×4): qty 2

## 2020-08-29 MED ORDER — DEXTROSE 50 % IV SOLN: 0.0000 mL | INTRAVENOUS | Status: DC | PRN

## 2020-08-29 MED ORDER — 0.9 % SODIUM CHLORIDE (POUR BTL) OPTIME
TOPICAL | Status: DC | PRN
  Administered 2020-08-29: 5000 mL

## 2020-08-29 MED ORDER — SODIUM CHLORIDE 0.9 % IV SOLN: INTRAVENOUS | Status: DC | PRN

## 2020-08-29 MED ORDER — MORPHINE SULFATE (PF) 2 MG/ML IV SOLN
1.0000 mg | INTRAVENOUS | Status: DC | PRN
  Administered 2020-08-30 (×2): 2 mg via INTRAVENOUS
  Filled 2020-08-29: qty 1
  Filled 2020-08-29: qty 2

## 2020-08-29 MED ORDER — SODIUM CHLORIDE 0.9 % IV SOLN: 250.0000 mL | INTRAVENOUS | Status: DC

## 2020-08-29 MED ORDER — HEMOSTATIC AGENTS (NO CHARGE) OPTIME
TOPICAL | Status: DC | PRN
  Administered 2020-08-29 (×2): 1 via TOPICAL

## 2020-08-29 MED ORDER — LIDOCAINE 2% (20 MG/ML) 5 ML SYRINGE
INTRAMUSCULAR | Status: DC | PRN
  Administered 2020-08-29: 40 mg via INTRAVENOUS

## 2020-08-29 MED ORDER — CHLORHEXIDINE GLUCONATE 0.12 % MT SOLN
15.0000 mL | OROMUCOSAL | Status: AC
  Administered 2020-08-29: 15 mL via OROMUCOSAL

## 2020-08-29 MED ORDER — PROTAMINE SULFATE 10 MG/ML IV SOLN
INTRAVENOUS | Status: DC | PRN
  Administered 2020-08-29: 380 mg via INTRAVENOUS

## 2020-08-29 MED ORDER — ACETAMINOPHEN 160 MG/5ML PO SOLN: 1000.0000 mg | Freq: Four times a day (QID) | ORAL | Status: AC

## 2020-08-29 MED ORDER — SODIUM CHLORIDE 0.9% FLUSH
10.0000 mL | INTRAVENOUS | Status: DC | PRN
  Administered 2020-09-03: 10 mL

## 2020-08-29 MED ORDER — TRANEXAMIC ACID 1000 MG/10ML IV SOLN
1.5000 mg/kg/h | INTRAVENOUS | Status: DC
  Filled 2020-08-29: qty 25

## 2020-08-29 MED ORDER — FAMOTIDINE IN NACL 20-0.9 MG/50ML-% IV SOLN
20.0000 mg | Freq: Two times a day (BID) | INTRAVENOUS | Status: AC
  Filled 2020-08-29: qty 50

## 2020-08-29 MED ORDER — PROPOFOL 10 MG/ML IV BOLUS
INTRAVENOUS | Status: DC | PRN
  Administered 2020-08-29: 70 mg via INTRAVENOUS

## 2020-08-29 MED ORDER — FENTANYL CITRATE (PF) 250 MCG/5ML IJ SOLN
INTRAMUSCULAR | Status: DC | PRN
  Administered 2020-08-29: 200 ug via INTRAVENOUS
  Administered 2020-08-29: 150 ug via INTRAVENOUS
  Administered 2020-08-29: 200 ug via INTRAVENOUS
  Administered 2020-08-29: 50 ug via INTRAVENOUS
  Administered 2020-08-29: 250 ug via INTRAVENOUS
  Administered 2020-08-29: 150 ug via INTRAVENOUS

## 2020-08-29 MED FILL — Heparin Sodium (Porcine) Inj 1000 Unit/ML: INTRAMUSCULAR | Qty: 2500 | Status: CN

## 2020-08-29 MED FILL — Potassium Chloride Inj 2 mEq/ML: INTRAVENOUS | Qty: 40 | Status: AC

## 2020-08-29 MED FILL — Magnesium Sulfate Inj 50%: INTRAMUSCULAR | Qty: 10 | Status: AC

## 2020-08-29 MED FILL — Heparin Sodium (Porcine) Inj 1000 Unit/ML: INTRAMUSCULAR | Qty: 30 | Status: AC

## 2020-08-29 SURGICAL SUPPLY — 82 items
ATRICLIP EXCLUSION VLAA 45 (Miscellaneous) ×4 IMPLANT
BAG DECANTER FOR FLEXI CONT (MISCELLANEOUS) ×4 IMPLANT
BLADE CLIPPER SURG (BLADE) IMPLANT
BLADE STERNUM SYSTEM 6 (BLADE) ×4 IMPLANT
BNDG ELASTIC 4X5.8 VLCR STR LF (GAUZE/BANDAGES/DRESSINGS) ×4 IMPLANT
BNDG ELASTIC 6X10 VLCR STRL LF (GAUZE/BANDAGES/DRESSINGS) IMPLANT
BNDG ELASTIC 6X5.8 VLCR STR LF (GAUZE/BANDAGES/DRESSINGS) ×4 IMPLANT
BNDG GAUZE ELAST 4 BULKY (GAUZE/BANDAGES/DRESSINGS) ×4 IMPLANT
CANISTER SUCT 3000ML PPV (MISCELLANEOUS) ×4 IMPLANT
CANISTER WOUNDNEG PRESSURE 500 (CANNISTER) ×4 IMPLANT
CATH CPB KIT GERHARDT (MISCELLANEOUS) ×4 IMPLANT
CATH THORACIC 28FR (CATHETERS) ×4 IMPLANT
CLIP VESOCCLUDE SM WIDE 24/CT (CLIP) ×4 IMPLANT
DERMABOND ADVANCED (GAUZE/BANDAGES/DRESSINGS) ×1
DERMABOND ADVANCED .7 DNX12 (GAUZE/BANDAGES/DRESSINGS) ×3 IMPLANT
DEVICE EXCLUSIN ATRCLP VLAA 45 (Miscellaneous) ×3 IMPLANT
DRAIN CHANNEL 28F RND 3/8 FF (WOUND CARE) ×4 IMPLANT
DRAPE CARDIOVASCULAR INCISE (DRAPES) ×1
DRAPE SLUSH/WARMER DISC (DRAPES) ×4 IMPLANT
DRAPE SRG 135X102X78XABS (DRAPES) ×3 IMPLANT
DRSG AQUACEL AG ADV 3.5X14 (GAUZE/BANDAGES/DRESSINGS) IMPLANT
ELECT BLADE 4.0 EZ CLEAN MEGAD (MISCELLANEOUS) ×4
ELECT REM PT RETURN 9FT ADLT (ELECTROSURGICAL) ×8
ELECTRODE BLDE 4.0 EZ CLN MEGD (MISCELLANEOUS) ×3 IMPLANT
ELECTRODE REM PT RTRN 9FT ADLT (ELECTROSURGICAL) ×6 IMPLANT
FELT TEFLON 1X6 (MISCELLANEOUS) ×4 IMPLANT
FILTER SMOKE EVAC ULPA (FILTER) ×4 IMPLANT
GAUZE SPONGE 4X4 12PLY STRL (GAUZE/BANDAGES/DRESSINGS) ×4 IMPLANT
GAUZE SPONGE 4X4 12PLY STRL LF (GAUZE/BANDAGES/DRESSINGS) ×8 IMPLANT
GLOVE BIO SURGEON STRL SZ 6.5 (GLOVE) ×28 IMPLANT
GLOVE BIO SURGEON STRL SZ7.5 (GLOVE) ×4 IMPLANT
GLOVE BIOGEL PI IND STRL 6.5 (GLOVE) ×12 IMPLANT
GLOVE BIOGEL PI IND STRL 8.5 (GLOVE) ×3 IMPLANT
GLOVE BIOGEL PI INDICATOR 6.5 (GLOVE) ×4
GLOVE BIOGEL PI INDICATOR 8.5 (GLOVE) ×1
GOWN STRL REUS W/ TWL LRG LVL3 (GOWN DISPOSABLE) ×18 IMPLANT
GOWN STRL REUS W/TWL LRG LVL3 (GOWN DISPOSABLE) ×6
HEMOSTAT POWDER SURGIFOAM 1G (HEMOSTASIS) ×12 IMPLANT
HEMOSTAT SURGICEL 2X14 (HEMOSTASIS) ×4 IMPLANT
KIT BASIN OR (CUSTOM PROCEDURE TRAY) ×4 IMPLANT
KIT CATH SUCT 8FR (CATHETERS) ×4 IMPLANT
KIT PREVENA INCISION MGT20CM45 (CANNISTER) ×4 IMPLANT
KIT SUCTION CATH 14FR (SUCTIONS) ×8 IMPLANT
KIT TURNOVER KIT B (KITS) ×4 IMPLANT
KIT VASOVIEW HEMOPRO 2 VH 4000 (KITS) ×4 IMPLANT
LEAD PACING MYOCARDI (MISCELLANEOUS) ×4 IMPLANT
MARKER GRAFT CORONARY BYPASS (MISCELLANEOUS) ×12 IMPLANT
NS IRRIG 1000ML POUR BTL (IV SOLUTION) ×20 IMPLANT
PACK E OPEN HEART (SUTURE) ×4 IMPLANT
PACK OPEN HEART (CUSTOM PROCEDURE TRAY) ×4 IMPLANT
PAD ARMBOARD 7.5X6 YLW CONV (MISCELLANEOUS) IMPLANT
PAD ELECT DEFIB RADIOL ZOLL (MISCELLANEOUS) ×4 IMPLANT
PENCIL BUTTON HOLSTER BLD 10FT (ELECTRODE) ×4 IMPLANT
PENCIL SMOKE EVACUATOR (MISCELLANEOUS) ×4 IMPLANT
POSITIONER HEAD DONUT 9IN (MISCELLANEOUS) ×4 IMPLANT
PUNCH AORTIC ROTATE 4.5MM 8IN (MISCELLANEOUS) ×4 IMPLANT
SET CARDIOPLEGIA MPS 5001102 (MISCELLANEOUS) ×4 IMPLANT
SLEEVE SUCTION 125 (MISCELLANEOUS) ×4 IMPLANT
SPONGE LAP 18X18 RF (DISPOSABLE) ×16 IMPLANT
SUT BONE WAX W31G (SUTURE) ×4 IMPLANT
SUT PROLENE 3 0 SH1 36 (SUTURE) ×4 IMPLANT
SUT PROLENE 4 0 TF (SUTURE) ×8 IMPLANT
SUT PROLENE 6 0 C 1 30 (SUTURE) ×12 IMPLANT
SUT PROLENE 6 0 CC (SUTURE) ×16 IMPLANT
SUT PROLENE 7 0 BV1 MDA (SUTURE) ×8 IMPLANT
SUT PROLENE 7.0 RB 3 (SUTURE) ×4 IMPLANT
SUT PROLENE 8 0 BV175 6 (SUTURE) ×32 IMPLANT
SUT STEEL 6MS V (SUTURE) ×4 IMPLANT
SUT STEEL SZ 6 DBL 3X14 BALL (SUTURE) ×4 IMPLANT
SUT VIC AB 1 CTX 18 (SUTURE) ×8 IMPLANT
SUT VIC AB 2-0 CT1 27 (SUTURE) ×1
SUT VIC AB 2-0 CT1 TAPERPNT 27 (SUTURE) ×3 IMPLANT
SUT VIC AB 3-0 X1 27 (SUTURE) ×4 IMPLANT
SYSTEM SAHARA CHEST DRAIN ATS (WOUND CARE) ×4 IMPLANT
TAPE CLOTH SURG 4X10 WHT LF (GAUZE/BANDAGES/DRESSINGS) ×4 IMPLANT
TOWEL GREEN STERILE (TOWEL DISPOSABLE) ×4 IMPLANT
TOWEL GREEN STERILE FF (TOWEL DISPOSABLE) ×4 IMPLANT
TRAP FLUID SMOKE EVACUATOR (MISCELLANEOUS) ×4 IMPLANT
TRAY FOLEY SLVR 16FR TEMP STAT (SET/KITS/TRAYS/PACK) ×4 IMPLANT
TUBING LAP HI FLOW INSUFFLATIO (TUBING) ×4 IMPLANT
UNDERPAD 30X36 HEAVY ABSORB (UNDERPADS AND DIAPERS) ×4 IMPLANT
WATER STERILE IRR 1000ML POUR (IV SOLUTION) ×8 IMPLANT

## 2020-08-29 NOTE — Anesthesia Preprocedure Evaluation (Signed)
Anesthesia Evaluation  Patient identified by MRN, date of birth, ID band Patient awake    Reviewed: Allergy & Precautions, NPO status , Patient's Chart, lab work & pertinent test results  Airway Mallampati: III  TM Distance: >3 FB     Dental  (+) Partial Upper   Pulmonary former smoker,    breath sounds clear to auscultation       Cardiovascular hypertension,  Rhythm:Regular Rate:Normal     Neuro/Psych    GI/Hepatic   Endo/Other  diabetes  Renal/GU      Musculoskeletal   Abdominal (+) + obese,   Peds  Hematology   Anesthesia Other Findings   Reproductive/Obstetrics                             Anesthesia Physical Anesthesia Plan  ASA: III  Anesthesia Plan: General   Post-op Pain Management:    Induction: Intravenous  PONV Risk Score and Plan: Ondansetron and Dexamethasone  Airway Management Planned: Oral ETT  Additional Equipment: Arterial line, PA Cath, 3D TEE and Ultrasound Guidance Line Placement  Intra-op Plan:   Post-operative Plan: Post-operative intubation/ventilation  Informed Consent: I have reviewed the patients History and Physical, chart, labs and discussed the procedure including the risks, benefits and alternatives for the proposed anesthesia with the patient or authorized representative who has indicated his/her understanding and acceptance.       Plan Discussed with: CRNA and Anesthesiologist  Anesthesia Plan Comments:         Anesthesia Quick Evaluation

## 2020-08-29 NOTE — Progress Notes (Signed)
Patient ID: [name], male   DOB: May 09, 1935, 85 y.o.   MRN: [id number]  TCTS Evening Rounds:   Hemodynamically stable on milrinone 0.25, dop 2.5, NE 10. CI = 2.0  Slow waking up. Still on vent.  Urine output ok CT output low  CBC    Component Value Date/Time   WBC 18.1 (H) 08/29/2020 1524   RBC 3.61 (L) 08/29/2020 1524   HGB 10.5 (L) 08/29/2020 1524   HGB 12.6 (L) 01/06/2014 0708   HCT 32.8 (L) 08/29/2020 1524   HCT 39.8 (L) 01/06/2014 0708   PLT 176 08/29/2020 1524   PLT 198 01/06/2014 0708   MCV 90.9 08/29/2020 1524   MCV 86 01/06/2014 0708   MCH 29.1 08/29/2020 1524   MCHC 32.0 08/29/2020 1524   RDW 13.1 08/29/2020 1524   RDW 14.9 (H) 01/06/2014 0708   LYMPHSABS 2.0 08/18/2020 1756   LYMPHSABS 1.5 08/10/2012 0020   MONOABS 0.6 08/18/2020 1756   MONOABS 0.4 08/10/2012 0020   EOSABS 0.1 08/18/2020 1756   EOSABS 0.4 08/10/2012 0020   BASOSABS 0.0 08/18/2020 1756   BASOSABS 0.1 08/10/2012 0020     BMET    Component Value Date/Time   NA 140 08/29/2020 1343   NA 142 01/06/2014 0708   K 3.7 08/29/2020 1343   K 3.0 (L) 01/06/2014 0708   CL 103 08/29/2020 1343   CL 106 01/06/2014 0708   CO2 22 08/29/2020 0208   CO2 32 01/06/2014 0708   GLUCOSE 147 (H) 08/29/2020 1343   GLUCOSE 120 (H) 01/06/2014 0708   BUN 17 08/29/2020 1343   BUN 21 (H) 01/06/2014 0708   CREATININE 1.30 (H) 08/29/2020 1343   CREATININE 1.02 01/06/2014 0708   CALCIUM 8.6 (L) 08/29/2020 0208   CALCIUM 7.7 (L) 01/06/2014 0708   GFRNONAA 44 (L) 08/29/2020 0208   GFRNONAA >60 01/06/2014 0708   GFRAA 59 (L) 07/29/2019 0548   GFRAA >60 01/06/2014 0708     A/P:  Stable postop course. Continue current plans

## 2020-08-29 NOTE — Anesthesia Procedure Notes (Signed)
Central Venous Catheter Insertion Performed by: Roberts Gaudy, MD, anesthesiologist Start/End10/22/2021 6:50 AM, 08/29/2020 7:00 AM Patient location: Pre-op. Preanesthetic checklist: patient identified, IV checked, site marked, risks and benefits discussed, surgical consent, monitors and equipment checked, pre-op evaluation, timeout performed and anesthesia consent Lidocaine 1% used for infiltration and patient sedated Hand hygiene performed  and maximum sterile barriers used  Catheter size: 8.5 Fr Sheath introducer Procedure performed using ultrasound guided technique. Ultrasound Notes:anatomy identified, needle tip was noted to be adjacent to the nerve/plexus identified, no ultrasound evidence of intravascular and/or intraneural injection and image(s) printed for medical record Attempts: 1 Following insertion, line sutured and dressing applied. Post procedure assessment: blood return through all ports, free fluid flow and no air  Patient tolerated the procedure well with no immediate complications.

## 2020-08-29 NOTE — Anesthesia Postprocedure Evaluation (Signed)
Anesthesia Post Note  Patient: [name]  Procedure(s) Performed: CORONARY ARTERY BYPASS GRAFTING (CABG) X 5 USING LEFT INTERNAL MAMMARY ARTERY AND ENDOSCOPICALLY HARVESTED RIGHT GREATER SAPHENOUS VEIN. LIMA TO LAD, SVG TO OM SEQ TO CIRC, SVG TO PD, SVG TO DIAG. (N/A Chest) CLIPPING OF ATRIAL APPENDAGE USING ATRICURE 59 MM ATRICLIP FLEX-V (N/A ) TRANSESOPHAGEAL ECHOCARDIOGRAM (TEE) (N/A ) ENDOVEIN HARVEST OF GREATER SAPHENOUS VEIN (Right )     Patient location during evaluation: SICU Anesthesia Type: General Level of consciousness: sedated and patient remains intubated per anesthesia plan Pain management: pain level controlled Vital Signs Assessment: post-procedure vital signs reviewed and stable Respiratory status: patient remains intubated per anesthesia plan and patient on ventilator - see flowsheet for VS Cardiovascular status: stable Anesthetic complications: no   No complications documented.  Last Vitals:  Vitals:   08/29/20 1703 08/29/20 1715  BP:  (!) 89/60  Pulse: 89 88  Resp: 18 17  Temp: (!) 35.8 C (!) 35.8 C  SpO2: 98% 98%    Last Pain:  Vitals:   08/29/20 0400  TempSrc: Oral  PainSc:                  Leatta Alewine COKER

## 2020-08-29 NOTE — Anesthesia Procedure Notes (Signed)
Procedure Name: Intubation Date/Time: 08/29/2020 7:43 AM Performed by: Lance Coon, CRNA Pre-anesthesia Checklist: Patient identified, Emergency Drugs available, Suction available, Patient being monitored and Timeout performed Patient Re-evaluated:Patient Re-evaluated prior to induction Oxygen Delivery Method: Circle system utilized Preoxygenation: Pre-oxygenation with 100% oxygen Induction Type: IV induction Ventilation: Mask ventilation without difficulty and Oral airway inserted - appropriate to patient size Laryngoscope Size: Sabra Heck and 2 Grade View: Grade I Tube type: Oral Tube size: 8.0 mm Number of attempts: 1 Airway Equipment and Method: Stylet Placement Confirmation: ETT inserted through vocal cords under direct vision,  positive ETCO2 and breath sounds checked- equal and bilateral Secured at: 21 cm Tube secured with: Tape Dental Injury: Teeth and Oropharynx as per pre-operative assessment

## 2020-08-29 NOTE — Brief Op Note (Signed)
      [address]       [place] [address]             [phone]      08/29/2020  4:15 PM  PATIENT:  Horald Pollen  85 y.o. male  PRE-OPERATIVE DIAGNOSIS:  CAD  POST-OPERATIVE DIAGNOSIS:  CAD  PROCEDURE:  Procedure(s): CORONARY ARTERY BYPASS GRAFTING (CABG) X 5 USING LEFT INTERNAL MAMMARY ARTERY AND ENDOSCOPICALLY HARVESTED RIGHT GREATER SAPHENOUS VEIN. LIMA TO LAD, SVG TO OM SEQ TO CIRC, SVG TO PD, SVG TO DIAG. (N/A) CLIPPING OF ATRIAL APPENDAGE USING ATRICURE 45 MM ATRICLIP FLEX-V (N/A) TRANSESOPHAGEAL ECHOCARDIOGRAM (TEE) (N/A) ENDOVEIN HARVEST OF GREATER SAPHENOUS VEIN (Right) EVH 60 MIN   SURGEON:  Surgeon(s) and Role:    * Grace Isaac, MD - Primary  PHYSICIAN ASSISTANT: WAYNE GOLD PA-C  ASSISTANTS: STAFF   ANESTHESIA:   general  EBL:  980 mL   BLOOD ADMINISTERED:none  DRAINS: LEFT PLEURAL AND MEDIASTINAL CHEST TUBES   LOCAL MEDICATIONS USED:  NONE  SPECIMEN:  No Specimen  DISPOSITION OF SPECIMEN:  N/A  COUNTS:  YES   DICTATION: .Other Dictation: Dictation Number PENDING  PLAN OF CARE: Admit to inpatient   PATIENT DISPOSITION:  ICU - intubated and hemodynamically stable.   Delay start of Pharmacological VTE agent (>24hrs) due to surgical blood loss or risk of bleeding: yes  COMPLICATIONS: NO KNOWN

## 2020-08-29 NOTE — Plan of Care (Signed)
°  Problem: Education: °Goal: Ability to demonstrate management of disease process will improve °Outcome: Progressing °Goal: Ability to verbalize understanding of medication therapies will improve °Outcome: Progressing °Goal: Individualized Educational Video(s) °Outcome: Progressing °  °

## 2020-08-29 NOTE — Progress Notes (Signed)
      [address]       [place] [address]             [phone]    Pre Procedure note for inpatients:   [name] has been scheduled for Procedure(s): CORONARY ARTERY BYPASS GRAFTING (CABG) (N/A) CLIPPING OF ATRIAL APPENDAGE (N/A) TRANSESOPHAGEAL ECHOCARDIOGRAM (TEE) (N/A) today. The various methods of treatment have been discussed with the patient. After consideration of the risks, benefits and treatment options the patient has consented to the planned procedure.   The patient has been seen and labs reviewed. There are no changes in the patient's condition to prevent proceeding with the planned procedure today. Cr slightly lower then yesterday.  Recent labs:  Lab Results  Component Value Date   WBC 5.3 08/29/2020   HGB 11.8 (L) 08/29/2020   HCT 36.9 (L) 08/29/2020   PLT 207 08/29/2020   GLUCOSE 130 (H) 08/29/2020   CHOL 119 08/27/2020   TRIG 86 08/27/2020   HDL 40 (L) 08/27/2020   LDLCALC 62 08/27/2020   ALT 38 08/28/2020   AST 25 08/28/2020   NA 136 08/29/2020   K 3.6 08/29/2020   CL 104 08/29/2020   CREATININE 1.56 (H) 08/29/2020   BUN 18 08/29/2020   CO2 22 08/29/2020   INR 1.1 08/28/2020   HGBA1C 7.8 (H) 08/19/2020    Grace Isaac, MD 08/29/2020 7:09 AM

## 2020-08-29 NOTE — Progress Notes (Signed)
   Post op ECG shows sinus with RBBB and LAHB.Marland Kitchen RBBB is new. Ischemic T-waves have resolved.  Hemodynamics stable on nor-epi.

## 2020-08-29 NOTE — Anesthesia Procedure Notes (Signed)
Arterial Line Insertion Start/End10/22/2021 6:40 AM, 08/29/2020 6:50 AM Performed by: Lance Coon, CRNA, CRNA  Preanesthetic checklist: patient identified, IV checked, site marked, risks and benefits discussed, surgical consent, monitors and equipment checked, pre-op evaluation, timeout performed and anesthesia consent Lidocaine 1% used for infiltration and patient sedated Right, radial was placed Catheter size: 20 G Hand hygiene performed , maximum sterile barriers used  and Seldinger technique used  Attempts: 1 Procedure performed without using ultrasound guided technique. Following insertion, dressing applied and Biopatch. Post procedure assessment: normal and unchanged  Patient tolerated the procedure well with no immediate complications.

## 2020-08-29 NOTE — Anesthesia Procedure Notes (Signed)
Central Venous Catheter Insertion Performed by: Roberts Gaudy, MD, anesthesiologist Start/End10/22/2021 6:50 AM Patient location: Pre-op. Preanesthetic checklist: patient identified, IV checked, site marked, risks and benefits discussed, surgical consent, monitors and equipment checked, pre-op evaluation, timeout performed and anesthesia consent Hand hygiene performed  and maximum sterile barriers used  PA cath, Central line and Pacing wire was placed.Swan type:thermodilution Procedure performed without using ultrasound guided technique. Attempts: 1 Following insertion, line sutured, dressing applied and Biopatch. Post procedure assessment: blood return through all ports  Patient tolerated the procedure well with no immediate complications. Additional procedure comments: Inserted to 52 cm.

## 2020-08-29 NOTE — Transfer of Care (Signed)
Immediate Anesthesia Transfer of Care Note  Patient: [name]  Procedure(s) Performed: CORONARY ARTERY BYPASS GRAFTING (CABG) X 5 USING LEFT INTERNAL MAMMARY ARTERY AND ENDOSCOPICALLY HARVESTED RIGHT GREATER SAPHENOUS VEIN. LIMA TO LAD, SVG TO OM SEQ TO CIRC, SVG TO PD, SVG TO DIAG. (N/A Chest) CLIPPING OF ATRIAL APPENDAGE USING ATRICURE 28 MM ATRICLIP FLEX-V (N/A ) TRANSESOPHAGEAL ECHOCARDIOGRAM (TEE) (N/A ) ENDOVEIN HARVEST OF GREATER SAPHENOUS VEIN (Right )  Patient Location: SICU  Anesthesia Type:General  Level of Consciousness: drowsy, patient cooperative and Patient remains intubated per anesthesia plan  Airway & Oxygen Therapy: Patient remains intubated per anesthesia plan and Patient placed on Ventilator (see vital sign flow sheet for setting)  Post-op Assessment: Report given to RN and Post -op Vital signs reviewed and stable  Post vital signs: Reviewed and stable  Last Vitals:  Vitals Value Taken Time  BP    Temp 36 C 08/29/20 1516  Pulse 89 08/29/20 1516  Resp 0 08/29/20 1516  SpO2 92 % 08/29/20 1516  Vitals shown include unvalidated device data.  Last Pain:  Vitals:   08/29/20 0400  TempSrc: Oral  PainSc:          Complications: No complications documented.

## 2020-08-30 ENCOUNTER — Inpatient Hospital Stay (HOSPITAL_COMMUNITY): Payer: Medicare HMO

## 2020-08-30 LAB — POCT I-STAT 7, (LYTES, BLD GAS, ICA,H+H)
Acid-base deficit: 4 mmol/L — ABNORMAL HIGH (ref 0.0–2.0)
Acid-base deficit: 5 mmol/L — ABNORMAL HIGH (ref 0.0–2.0)
Bicarbonate: 19.9 mmol/L — ABNORMAL LOW (ref 20.0–28.0)
Bicarbonate: 20.7 mmol/L (ref 20.0–28.0)
Calcium, Ion: 1.18 mmol/L (ref 1.15–1.40)
Calcium, Ion: 1.18 mmol/L (ref 1.15–1.40)
HCT: 28 % — ABNORMAL LOW (ref 39.0–52.0)
HCT: 30 % — ABNORMAL LOW (ref 39.0–52.0)
Hemoglobin: 10.2 g/dL — ABNORMAL LOW (ref 13.0–17.0)
Hemoglobin: 9.5 g/dL — ABNORMAL LOW (ref 13.0–17.0)
O2 Saturation: 94 %
O2 Saturation: 96 %
Patient temperature: 36.1
Patient temperature: 36.2
Potassium: 3.8 mmol/L (ref 3.5–5.1)
Potassium: 3.8 mmol/L (ref 3.5–5.1)
Sodium: 139 mmol/L (ref 135–145)
Sodium: 140 mmol/L (ref 135–145)
TCO2: 21 mmol/L — ABNORMAL LOW (ref 22–32)
TCO2: 22 mmol/L (ref 22–32)
pCO2 arterial: 35.6 mmHg (ref 32.0–48.0)
pCO2 arterial: 36.1 mmHg (ref 32.0–48.0)
pH, Arterial: 7.345 — ABNORMAL LOW (ref 7.350–7.450)
pH, Arterial: 7.37 (ref 7.350–7.450)
pO2, Arterial: 67 mmHg — ABNORMAL LOW (ref 83.0–108.0)
pO2, Arterial: 80 mmHg — ABNORMAL LOW (ref 83.0–108.0)

## 2020-08-30 LAB — BASIC METABOLIC PANEL
Anion gap: 10 (ref 5–15)
BUN: 19 mg/dL (ref 8–23)
CO2: 20 mmol/L — ABNORMAL LOW (ref 22–32)
Calcium: 7.8 mg/dL — ABNORMAL LOW (ref 8.9–10.3)
Chloride: 108 mmol/L (ref 98–111)
Creatinine, Ser: 1.92 mg/dL — ABNORMAL HIGH (ref 0.61–1.24)
GFR, Estimated: 34 mL/min — ABNORMAL LOW (ref >60)
Glucose, Bld: 130 mg/dL — ABNORMAL HIGH (ref 70–99)
Potassium: 3.9 mmol/L (ref 3.5–5.1)
Sodium: 138 mmol/L (ref 135–145)

## 2020-08-30 LAB — CBC
HCT: 29.8 % — ABNORMAL LOW (ref 39.0–52.0)
HCT: 28.9 % — ABNORMAL LOW (ref 39.0–52.0)
Hemoglobin: 9.5 g/dL — ABNORMAL LOW (ref 13.0–17.0)
Hemoglobin: 9.2 g/dL — ABNORMAL LOW (ref 13.0–17.0)
MCH: 28.8 pg (ref 26.0–34.0)
MCH: 29.1 pg (ref 26.0–34.0)
MCHC: 31.9 g/dL (ref 30.0–36.0)
MCHC: 31.8 g/dL (ref 30.0–36.0)
MCV: 90.3 fL (ref 80.0–100.0)
MCV: 91.5 fL (ref 80.0–100.0)
Platelets: 164 10*3/uL (ref 150–400)
Platelets: 154 10*3/uL (ref 150–400)
RBC: 3.3 MIL/uL — ABNORMAL LOW (ref 4.22–5.81)
RBC: 3.16 MIL/uL — ABNORMAL LOW (ref 4.22–5.81)
RDW: 13.2 % (ref 11.5–15.5)
RDW: 13.5 % (ref 11.5–15.5)
WBC: 16.2 10*3/uL — ABNORMAL HIGH (ref 4.0–10.5)
WBC: 22.2 10*3/uL — ABNORMAL HIGH (ref 4.0–10.5)
nRBC: 0 % (ref 0.0–0.2)
nRBC: 0 % (ref 0.0–0.2)

## 2020-08-30 LAB — GLUCOSE, CAPILLARY
Glucose-Capillary: 116 mg/dL — ABNORMAL HIGH (ref 70–99)
Glucose-Capillary: 121 mg/dL — ABNORMAL HIGH (ref 70–99)
Glucose-Capillary: 124 mg/dL — ABNORMAL HIGH (ref 70–99)
Glucose-Capillary: 126 mg/dL — ABNORMAL HIGH (ref 70–99)
Glucose-Capillary: 127 mg/dL — ABNORMAL HIGH (ref 70–99)
Glucose-Capillary: 128 mg/dL — ABNORMAL HIGH (ref 70–99)
Glucose-Capillary: 128 mg/dL — ABNORMAL HIGH (ref 70–99)
Glucose-Capillary: 131 mg/dL — ABNORMAL HIGH (ref 70–99)
Glucose-Capillary: 133 mg/dL — ABNORMAL HIGH (ref 70–99)
Glucose-Capillary: 136 mg/dL — ABNORMAL HIGH (ref 70–99)
Glucose-Capillary: 140 mg/dL — ABNORMAL HIGH (ref 70–99)

## 2020-08-30 LAB — CREATININE, SERUM
Creatinine, Ser: 2.55 mg/dL — ABNORMAL HIGH (ref 0.61–1.24)
GFR, Estimated: 24 mL/min — ABNORMAL LOW (ref >60)

## 2020-08-30 LAB — ELECTROLYTE PANEL
Anion gap: 13 (ref 5–15)
CO2: 19 mmol/L — ABNORMAL LOW (ref 22–32)
Chloride: 104 mmol/L (ref 98–111)
Potassium: 4.3 mmol/L (ref 3.5–5.1)
Sodium: 136 mmol/L (ref 135–145)

## 2020-08-30 LAB — BUN: BUN: 22 mg/dL (ref 8–23)

## 2020-08-30 LAB — MAGNESIUM
Magnesium: 2.1 mg/dL (ref 1.7–2.4)
Magnesium: 2.2 mg/dL (ref 1.7–2.4)

## 2020-08-30 LAB — CALCIUM: Calcium: 7.9 mg/dL — ABNORMAL LOW (ref 8.9–10.3)

## 2020-08-30 MED ORDER — CHLORHEXIDINE GLUCONATE 0.12 % MT SOLN
15.0000 mL | Freq: Two times a day (BID) | OROMUCOSAL | Status: DC
  Administered 2020-08-30 – 2020-08-31 (×2): 15 mL via OROMUCOSAL
  Filled 2020-08-30 (×2): qty 15

## 2020-08-30 MED ORDER — INSULIN ASPART 100 UNIT/ML ~~LOC~~ SOLN
0.0000 [IU] | SUBCUTANEOUS | Status: DC
  Administered 2020-08-30 – 2020-08-31 (×3): 2 [IU] via SUBCUTANEOUS
  Administered 2020-08-31: 4 [IU] via SUBCUTANEOUS
  Administered 2020-08-31 (×2): 2 [IU] via SUBCUTANEOUS

## 2020-08-30 MED ORDER — INSULIN DETEMIR 100 UNIT/ML ~~LOC~~ SOLN
20.0000 [IU] | Freq: Every day | SUBCUTANEOUS | Status: AC
  Administered 2020-08-30: 20 [IU] via SUBCUTANEOUS
  Filled 2020-08-30 (×2): qty 0.2

## 2020-08-30 MED ORDER — NOREPINEPHRINE 16 MG/250ML-% IV SOLN
0.0000 ug/min | INTRAVENOUS | Status: DC
  Administered 2020-08-30: 12 ug/min via INTRAVENOUS
  Filled 2020-08-30: qty 250

## 2020-08-30 MED ORDER — ORAL CARE MOUTH RINSE: 15.0000 mL | Freq: Two times a day (BID) | OROMUCOSAL | Status: DC

## 2020-08-30 MED ORDER — CHLORHEXIDINE GLUCONATE CLOTH 2 % EX PADS
6.0000 | MEDICATED_PAD | Freq: Every day | CUTANEOUS | Status: DC
  Administered 2020-08-31 – 2020-09-10 (×10): 6 via TOPICAL

## 2020-08-30 MED ORDER — ENOXAPARIN SODIUM 30 MG/0.3ML ~~LOC~~ SOLN
30.0000 mg | Freq: Every day | SUBCUTANEOUS | Status: DC
  Administered 2020-08-30 – 2020-09-09 (×11): 30 mg via SUBCUTANEOUS
  Filled 2020-08-30 (×11): qty 0.3

## 2020-08-30 MED ORDER — INSULIN DETEMIR 100 UNIT/ML ~~LOC~~ SOLN
20.0000 [IU] | Freq: Every day | SUBCUTANEOUS | Status: DC
  Administered 2020-08-31 – 2020-09-04 (×4): 20 [IU] via SUBCUTANEOUS
  Filled 2020-08-30 (×6): qty 0.2

## 2020-08-30 MED ORDER — TRAMADOL HCL 50 MG PO TABS
50.0000 mg | ORAL_TABLET | Freq: Two times a day (BID) | ORAL | Status: DC | PRN
  Administered 2020-09-01 – 2020-09-02 (×2): 50 mg via ORAL
  Filled 2020-08-30 (×2): qty 1

## 2020-08-30 MED ORDER — FUROSEMIDE 10 MG/ML IJ SOLN
80.0000 mg | Freq: Once | INTRAMUSCULAR | Status: AC
  Administered 2020-08-30: 80 mg via INTRAVENOUS
  Filled 2020-08-30: qty 8
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
## 2020-08-30 NOTE — Addendum Note (Signed)
Addendum  created 08/30/20 0732 by Roberts Gaudy, MD   Clinical Note Signed

## 2020-08-30 NOTE — Progress Notes (Addendum)
Patient ID: [name], male   DOB: 1935/07/03, 85 y.o.   MRN: [id number] TCTS Evening Rounds:  Hemodynamically stable off milrinone with CI 3.8. Weaning NE Continuing dop 2.5  Oliguric today with increase in creat to 2.55  BMET    Component Value Date/Time   NA 136 08/30/2020 1625   NA 142 01/06/2014 0708   K 4.3 08/30/2020 1625   K 3.0 (L) 01/06/2014 0708   CL 104 08/30/2020 1625   CL 106 01/06/2014 0708   CO2 19 (L) 08/30/2020 1625   CO2 32 01/06/2014 0708   GLUCOSE 130 (H) 08/30/2020 0358   GLUCOSE 120 (H) 01/06/2014 0708   BUN 22 08/30/2020 1625   BUN 21 (H) 01/06/2014 0708   CREATININE 2.55 (H) 08/30/2020 1625   CREATININE 1.02 01/06/2014 0708   CALCIUM 7.9 (L) 08/30/2020 1625   CALCIUM 7.7 (L) 01/06/2014 0708   GFRNONAA 24 (L) 08/30/2020 1625   GFRNONAA >60 01/06/2014 0708   GFRAA 59 (L) 07/29/2019 0548   GFRAA >60 01/06/2014 0708    Holding off on diuretic until off NE but with oliguria will give him a dose this pm.

## 2020-08-30 NOTE — Progress Notes (Signed)
1 Day Post-Op Procedure(s) (LRB): CORONARY ARTERY BYPASS GRAFTING (CABG) X 5 USING LEFT INTERNAL MAMMARY ARTERY AND ENDOSCOPICALLY HARVESTED RIGHT GREATER SAPHENOUS VEIN. LIMA TO LAD, SVG TO OM SEQ TO CIRC, SVG TO PD, SVG TO DIAG. (N/A) CLIPPING OF ATRIAL APPENDAGE USING ATRICURE 45 MM ATRICLIP FLEX-V (N/A) TRANSESOPHAGEAL ECHOCARDIOGRAM (TEE) (N/A) ENDOVEIN HARVEST OF GREATER SAPHENOUS VEIN (Right) Subjective: No complaints  Objective: Vital signs in last 24 hours: Temp:  [96.3 F (35.7 C)-97.3 F (36.3 C)] 96.8 F (36 C) (10/23 0700) Pulse Rate:  [48-107] 89 (10/23 0700) Cardiac Rhythm: Heart block (10/22 2000) Resp:  [0-33] 16 (10/23 0700) BP: (86-123)/(48-80) 104/48 (10/23 0700) SpO2:  [90 %-100 %] 97 % (10/23 0845) Arterial Line BP: (87-148)/(44-64) 100/51 (10/23 0700) FiO2 (%):  [40 %-70 %] 40 % (10/23 0030) Weight:  [107.2 kg] 107.2 kg (10/23 0515)  Hemodynamic parameters for last 24 hours: PAP: (21-38)/(12-29) 30/22 CO:  [4.3 L/min-5.5 L/min] 5.5 L/min CI:  [1.9 L/min/m2-2.5 L/min/m2] 2.5 L/min/m2  Intake/Output from previous day: 10/22 0701 - 10/23 0700 In: 4128.7 [I.V.:4171.8; Blood:500; NG/GT:90; IV Piggyback:2194.5] Out: 2045 [Urine:725; Emesis/NG output:50; Blood:980; Chest Tube:290] Intake/Output this shift: No intake/output data recorded.  General appearance: alert and cooperative Neurologic: intact Heart: regular rate and rhythm, S1, S2 normal, no murmur, click, rub or gallop Lungs: rhonchi RUL Extremities: edema moderate Wound: dressings dry  Lab Results: Recent Labs    08/29/20 2010 08/29/20 2017 08/30/20 0209 08/30/20 0358  WBC 16.9*  --   --  16.2*  HGB 9.8*   < > 10.2* 9.5*  HCT 30.4*   < > 30.0* 29.8*  PLT 180  --   --  164   < > = values in this interval not displayed.   BMET:  Recent Labs    08/29/20 2010 08/29/20 2017 08/30/20 0209 08/30/20 0358  NA 137   < > 140 138  K 3.9   < > 3.8 3.9  CL 108  --   --  108  CO2 18*  --   --   20*  GLUCOSE 151*  --   --  130*  BUN 17  --   --  19  CREATININE 1.70*  --   --  1.92*  CALCIUM 7.6*  --   --  7.8*   < > = values in this interval not displayed.    PT/INR:  Recent Labs    08/29/20 1524  LABPROT 16.7*  INR 1.4*   ABG    Component Value Date/Time   PHART 7.345 (L) 08/30/2020 0209   HCO3 19.9 (L) 08/30/2020 0209   TCO2 21 (L) 08/30/2020 0209   ACIDBASEDEF 5.0 (H) 08/30/2020 0209   O2SAT 96.0 08/30/2020 0209   CBG (last 3)  Recent Labs    08/30/20 0334 08/30/20 0546 08/30/20 0636  GLUCAP 127* 126* 121*   CXR: RUL atelectasis  ECG: sinus with PAC's  Assessment/Plan: S/P Procedure(s) (LRB): CORONARY ARTERY BYPASS GRAFTING (CABG) X 5 USING LEFT INTERNAL MAMMARY ARTERY AND ENDOSCOPICALLY HARVESTED RIGHT GREATER SAPHENOUS VEIN. LIMA TO LAD, SVG TO OM SEQ TO CIRC, SVG TO PD, SVG TO DIAG. (N/A) CLIPPING OF ATRIAL APPENDAGE USING ATRICURE 45 MM ATRICLIP FLEX-V (N/A) TRANSESOPHAGEAL ECHOCARDIOGRAM (TEE) (N/A) ENDOVEIN HARVEST OF GREATER SAPHENOUS VEIN (Right)  POD 1 Hemodynamically stable on milrinone 0.25, dop 2.5 and NE. EF 45-50% pre-bypass in OR, unchanged post-bypass. Wean milrinone and NE as tolerated. Continue dopamine for renal effect with elevated creat. Hold off on beta blocker for now  while on pressors.  Stage 3 CKD: preop creat 1.5-1.8. Continue renal dopamine. Hold off on diuresis today.  Dangle later and DC chest tubes, swan.  IS, OOB  DM: glucose under control. Transition to Levemir and SSI.     LOS: 5 days    Gaye Pollack 08/30/2020

## 2020-08-30 NOTE — Procedures (Signed)
Extubation Procedure Note  Patient Details:   Name: [name] DOB: 08-26-1935 MRN: [id number]   Airway Documentation:    Vent end date: 08/30/20 Vent end time: 0100   Patient extubated to 4L [place]. Patient had a cuff leak. VC and NIF done. VC of 1.1L and NIF -25. Patient had good effort. RN at beside doing IS with patient. Patient able to speak. No stridor noted.  Evaluation  O2 sats: stable throughout Complications: No apparent complications Patient did tolerate procedure well. Bilateral Breath Sounds: Clear, Diminished   Yes  Kelle Darting 08/30/2020, 0100

## 2020-08-31 ENCOUNTER — Inpatient Hospital Stay (HOSPITAL_COMMUNITY): Payer: Medicare HMO

## 2020-08-31 LAB — BASIC METABOLIC PANEL
Anion gap: 10 (ref 5–15)
BUN: 25 mg/dL — ABNORMAL HIGH (ref 8–23)
CO2: 21 mmol/L — ABNORMAL LOW (ref 22–32)
Calcium: 7.8 mg/dL — ABNORMAL LOW (ref 8.9–10.3)
Chloride: 107 mmol/L (ref 98–111)
Creatinine, Ser: 3 mg/dL — ABNORMAL HIGH (ref 0.61–1.24)
GFR, Estimated: 20 mL/min — ABNORMAL LOW (ref >60)
Glucose, Bld: 155 mg/dL — ABNORMAL HIGH (ref 70–99)
Potassium: 4.4 mmol/L (ref 3.5–5.1)
Sodium: 138 mmol/L (ref 135–145)

## 2020-08-31 LAB — CBC
HCT: 26.8 % — ABNORMAL LOW (ref 39.0–52.0)
Hemoglobin: 8.5 g/dL — ABNORMAL LOW (ref 13.0–17.0)
MCH: 29.3 pg (ref 26.0–34.0)
MCHC: 31.7 g/dL (ref 30.0–36.0)
MCV: 92.4 fL (ref 80.0–100.0)
Platelets: 129 10*3/uL — ABNORMAL LOW (ref 150–400)
RBC: 2.9 MIL/uL — ABNORMAL LOW (ref 4.22–5.81)
RDW: 13.8 % (ref 11.5–15.5)
WBC: 17.7 10*3/uL — ABNORMAL HIGH (ref 4.0–10.5)
nRBC: 0 % (ref 0.0–0.2)

## 2020-08-31 LAB — GLUCOSE, CAPILLARY
Glucose-Capillary: 113 mg/dL — ABNORMAL HIGH (ref 70–99)
Glucose-Capillary: 138 mg/dL — ABNORMAL HIGH (ref 70–99)
Glucose-Capillary: 144 mg/dL — ABNORMAL HIGH (ref 70–99)
Glucose-Capillary: 152 mg/dL — ABNORMAL HIGH (ref 70–99)
Glucose-Capillary: 173 mg/dL — ABNORMAL HIGH (ref 70–99)
Glucose-Capillary: 80 mg/dL (ref 70–99)

## 2020-08-31 LAB — COOXEMETRY PANEL
Carboxyhemoglobin: 0.8 % (ref 0.5–1.5)
Methemoglobin: 0.9 % (ref 0.0–1.5)
O2 Saturation: 75.9 %
Total hemoglobin: 8.8 g/dL — ABNORMAL LOW (ref 12.0–16.0)

## 2020-08-31 MED ORDER — MIDODRINE HCL 5 MG PO TABS
10.0000 mg | ORAL_TABLET | Freq: Three times a day (TID) | ORAL | Status: DC
  Administered 2020-08-31 – 2020-09-04 (×15): 10 mg via ORAL
  Filled 2020-08-31 (×15): qty 2

## 2020-08-31 NOTE — Progress Notes (Signed)
2 Days Post-Op Procedure(s) (LRB): CORONARY ARTERY BYPASS GRAFTING (CABG) X 5 USING LEFT INTERNAL MAMMARY ARTERY AND ENDOSCOPICALLY HARVESTED RIGHT GREATER SAPHENOUS VEIN. LIMA TO LAD, SVG TO OM SEQ TO CIRC, SVG TO PD, SVG TO DIAG. (N/A) CLIPPING OF ATRIAL APPENDAGE USING ATRICURE 45 MM ATRICLIP FLEX-V (N/A) TRANSESOPHAGEAL ECHOCARDIOGRAM (TEE) (N/A) ENDOVEIN HARVEST OF GREATER SAPHENOUS VEIN (Right) Subjective: No complaints  Objective: Vital signs in last 24 hours: Temp:  [96.8 F (36 C)-98.6 F (37 C)] 98.6 F (37 C) (10/24 0732) Pulse Rate:  [29-109] 92 (10/24 0500) Cardiac Rhythm: Normal sinus rhythm (10/23 2000) Resp:  [0-19] 10 (10/24 0500) BP: (95-155)/(43-127) 104/43 (10/24 0500) SpO2:  [85 %-100 %] 99 % (10/24 0500) Arterial Line BP: (95-153)/(46-58) 97/48 (10/24 0500) Weight:  [108.7 kg] 108.7 kg (10/24 0630)  Hemodynamic parameters for last 24 hours:    Intake/Output from previous day: 10/23 0701 - 10/24 0700 In: 704.9 [P.O.:120; I.V.:484.9; IV Piggyback:100.1] Out: 285 [Urine:255; Chest Tube:30] Intake/Output this shift: No intake/output data recorded.  General appearance: alert and cooperative Neurologic: intact Heart: regular rate and rhythm and with frequent extra beats Lungs: clear to auscultation bilaterally Abdomen: soft, non-tender; bowel sounds normal; no masses,  no organomegaly Extremities: edema moderate Wound: Prevena dressing in place.  Lab Results: Recent Labs    08/30/20 1625 08/31/20 0358  WBC 22.2* 17.7*  HGB 9.2* 8.5*  HCT 28.9* 26.8*  PLT 154 129*   BMET:  Recent Labs    08/30/20 0358 08/30/20 0358 08/30/20 1625 08/31/20 0358  NA 138   < > 136 138  K 3.9   < > 4.3 4.4  CL 108   < > 104 107  CO2 20*   < > 19* 21*  GLUCOSE 130*  --   --  155*  BUN 19   < > 22 25*  CREATININE 1.92*   < > 2.55* 3.00*  CALCIUM 7.8*   < > 7.9* 7.8*   < > = values in this interval not displayed.    PT/INR:  Recent Labs    08/29/20 1524   LABPROT 16.7*  INR 1.4*   ABG    Component Value Date/Time   PHART 7.345 (L) 08/30/2020 0209   HCO3 19.9 (L) 08/30/2020 0209   TCO2 21 (L) 08/30/2020 0209   ACIDBASEDEF 5.0 (H) 08/30/2020 0209   O2SAT 75.9 08/31/2020 0358   CBG (last 3)  Recent Labs    08/30/20 2351 08/31/20 0351 08/31/20 0731  GLUCAP 136* 144* 138*    Assessment/Plan: S/P Procedure(s) (LRB): CORONARY ARTERY BYPASS GRAFTING (CABG) X 5 USING LEFT INTERNAL MAMMARY ARTERY AND ENDOSCOPICALLY HARVESTED RIGHT GREATER SAPHENOUS VEIN. LIMA TO LAD, SVG TO OM SEQ TO CIRC, SVG TO PD, SVG TO DIAG. (N/A) CLIPPING OF ATRIAL APPENDAGE USING ATRICURE 45 MM ATRICLIP FLEX-V (N/A) TRANSESOPHAGEAL ECHOCARDIOGRAM (TEE) (N/A) ENDOVEIN HARVEST OF GREATER SAPHENOUS VEIN (Right)  POD 2  Hemodynamically stable but still requiring low dose NE to support BP and dop 2.5 for renal perfusion. Will add midodrine so we can try to wean off NE. Co-ox is good at 76%.  Acute on chronic postop renal failure. Stage 3 CKD preop with baseline creat 1.5 which has climbed to 3.0 today. He has been oliguric but did respond a little to lasix last night. K+ and HCO3 ok. Continue low dose dopamine and maintain MAP 70.   Volume excess: wt is about 14 lbs over preop. Will need diuresis once creatinine improves.  IS, ambulation.   LOS: 6  days    Gaye Pollack 08/31/2020

## 2020-09-01 ENCOUNTER — Encounter (HOSPITAL_COMMUNITY): Payer: Self-pay | Admitting: Cardiothoracic Surgery

## 2020-09-01 ENCOUNTER — Inpatient Hospital Stay (HOSPITAL_COMMUNITY): Payer: Medicare HMO

## 2020-09-01 LAB — BASIC METABOLIC PANEL
Anion gap: 8 (ref 5–15)
BUN: 34 mg/dL — ABNORMAL HIGH (ref 8–23)
CO2: 22 mmol/L (ref 22–32)
Calcium: 7.8 mg/dL — ABNORMAL LOW (ref 8.9–10.3)
Chloride: 107 mmol/L (ref 98–111)
Creatinine, Ser: 3.97 mg/dL — ABNORMAL HIGH (ref 0.61–1.24)
GFR, Estimated: 14 mL/min — ABNORMAL LOW (ref >60)
Glucose, Bld: 86 mg/dL (ref 70–99)
Potassium: 4.4 mmol/L (ref 3.5–5.1)
Sodium: 137 mmol/L (ref 135–145)

## 2020-09-01 LAB — GLUCOSE, CAPILLARY
Glucose-Capillary: 99 mg/dL (ref 70–99)
Glucose-Capillary: 78 mg/dL (ref 70–99)
Glucose-Capillary: 83 mg/dL (ref 70–99)
Glucose-Capillary: 90 mg/dL (ref 70–99)
Glucose-Capillary: 130 mg/dL — ABNORMAL HIGH (ref 70–99)

## 2020-09-01 LAB — COOXEMETRY PANEL
Carboxyhemoglobin: 0.8 % (ref 0.5–1.5)
Methemoglobin: 0.8 % (ref 0.0–1.5)
O2 Saturation: 70 %
Total hemoglobin: 8.8 g/dL — ABNORMAL LOW (ref 12.0–16.0)

## 2020-09-01 LAB — CBC
HCT: 27.5 % — ABNORMAL LOW (ref 39.0–52.0)
Hemoglobin: 8.7 g/dL — ABNORMAL LOW (ref 13.0–17.0)
MCH: 29.7 pg (ref 26.0–34.0)
MCHC: 31.6 g/dL (ref 30.0–36.0)
MCV: 93.9 fL (ref 80.0–100.0)
Platelets: 118 10*3/uL — ABNORMAL LOW (ref 150–400)
RBC: 2.93 MIL/uL — ABNORMAL LOW (ref 4.22–5.81)
RDW: 14 % (ref 11.5–15.5)
WBC: 12.3 10*3/uL — ABNORMAL HIGH (ref 4.0–10.5)
nRBC: 0 % (ref 0.0–0.2)

## 2020-09-01 LAB — URINALYSIS, ROUTINE W REFLEX MICROSCOPIC
Bilirubin Urine: NEGATIVE
Glucose, UA: NEGATIVE mg/dL
Ketones, ur: NEGATIVE mg/dL
Nitrite: NEGATIVE
Protein, ur: NEGATIVE mg/dL
Specific Gravity, Urine: 1.01 (ref 1.005–1.030)
pH: 5 (ref 5.0–8.0)

## 2020-09-01 MED ORDER — INSULIN ASPART 100 UNIT/ML ~~LOC~~ SOLN
0.0000 [IU] | Freq: Three times a day (TID) | SUBCUTANEOUS | Status: DC
  Administered 2020-09-02: 2 [IU] via SUBCUTANEOUS
  Administered 2020-09-02: 4 [IU] via SUBCUTANEOUS
  Administered 2020-09-02 – 2020-09-04 (×4): 2 [IU] via SUBCUTANEOUS
  Administered 2020-09-04: 4 [IU] via SUBCUTANEOUS

## 2020-09-01 MED ORDER — ADULT MULTIVITAMIN W/MINERALS CH
1.0000 | ORAL_TABLET | Freq: Every day | ORAL | Status: DC
  Administered 2020-09-01 – 2020-09-10 (×10): 1 via ORAL
  Filled 2020-09-01 (×10): qty 1

## 2020-09-01 MED ORDER — FUROSEMIDE 10 MG/ML IJ SOLN
40.0000 mg | Freq: Two times a day (BID) | INTRAMUSCULAR | Status: DC
  Administered 2020-09-01 – 2020-09-04 (×6): 40 mg via INTRAVENOUS
  Filled 2020-09-01 (×6): qty 4

## 2020-09-01 MED ORDER — DARBEPOETIN ALFA 150 MCG/0.3ML IJ SOSY
150.0000 ug | PREFILLED_SYRINGE | INTRAMUSCULAR | Status: DC
  Administered 2020-09-01 – 2020-09-08 (×2): 150 ug via SUBCUTANEOUS
  Filled 2020-09-01 (×2): qty 0.3

## 2020-09-01 MED ORDER — ENSURE ENLIVE PO LIQD
237.0000 mL | Freq: Three times a day (TID) | ORAL | Status: DC
  Administered 2020-09-01 – 2020-09-10 (×23): 237 mL via ORAL

## 2020-09-01 NOTE — Addendum Note (Signed)
Addendum  created 09/01/20 0811 by Josephine Igo, CRNA   Order list changed
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
## 2020-09-01 NOTE — Progress Notes (Signed)
Patient ID: [name], male   DOB: 11-20-1934, 85 y.o.   MRN: [id number] EVENING ROUNDS NOTE :     [address]       [place] [address]             [phone]                 3 Days Post-Op Procedure(s) (LRB): CORONARY ARTERY BYPASS GRAFTING (CABG) X 5 USING LEFT INTERNAL MAMMARY ARTERY AND ENDOSCOPICALLY HARVESTED RIGHT GREATER SAPHENOUS VEIN. LIMA TO LAD, SVG TO OM SEQ TO CIRC, SVG TO PD, SVG TO DIAG. (N/A) CLIPPING OF ATRIAL APPENDAGE USING ATRICURE 45 MM ATRICLIP FLEX-V (N/A) TRANSESOPHAGEAL ECHOCARDIOGRAM (TEE) (N/A) ENDOVEIN HARVEST OF GREATER SAPHENOUS VEIN (Right)  Total Length of Stay:  LOS: 7 days  BP 129/70   Pulse (!) 54   Temp 97.8 F (36.6 C) (Oral)   Resp 10   Ht 5\' 8"  (1.727 m)   Wt 110 kg   SpO2 100%   BMI 36.87 kg/m   .Intake/Output      10/24 0701 - 10/25 0700 10/25 0701 - 10/26 0700   P.O.  240   I.V. (mL/kg) 581.3 (5.3) 90.4 (0.8)   IV Piggyback     Total Intake(mL/kg) 581.3 (5.3) 330.4 (3)   Urine (mL/kg/hr) 295 (0.1) 130 (0.1)   Chest Tube     Total Output 295 130   Net +286.3 +200.4          . sodium chloride Stopped (08/30/20 1039)  . sodium chloride    . sodium chloride 10 mL/hr at 09/01/20 1300  . DOPamine 2.5 mcg/kg/min (09/01/20 1300)  . insulin Stopped (08/30/20 1348)  . lactated ringers 10 mL/hr at 08/30/20 0600  . lactated ringers    . nitroGLYCERIN Stopped (08/29/20 1500)  . norepinephrine (LEVOPHED) Adult infusion Stopped (08/31/20 1516)     Lab Results  Component Value Date   WBC 12.3 (H) 09/01/2020   HGB 8.7 (L) 09/01/2020   HCT 27.5 (L) 09/01/2020   PLT 118 (L) 09/01/2020   GLUCOSE 86 09/01/2020   CHOL 119 08/27/2020   TRIG 86 08/27/2020   HDL 40 (L) 08/27/2020   LDLCALC 62 08/27/2020   ALT 38 08/28/2020   AST 25 08/28/2020   NA 137 09/01/2020   K 4.4 09/01/2020   CL 107 09/01/2020   CREATININE 3.97 (H) 09/01/2020   BUN 34 (H) 09/01/2020   CO2 22 09/01/2020   INR 1.4 (H) 08/29/2020    HGBA1C 7.8 (H) 08/19/2020   Stable day Seen by renal- start more diuresis Stable day    Grace Isaac MD  Beeper [phone] Office [phone] 09/01/2020 4:24 PM
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
## 2020-09-01 NOTE — Progress Notes (Addendum)
[facility] DAILY ICU PROGRESS NOTE                   [address]            [place] [address]          [phone]   3 Days Post-Op Procedure(s) (LRB): CORONARY ARTERY BYPASS GRAFTING (CABG) X 5 USING LEFT INTERNAL MAMMARY ARTERY AND ENDOSCOPICALLY HARVESTED RIGHT GREATER SAPHENOUS VEIN. LIMA TO LAD, SVG TO OM SEQ TO CIRC, SVG TO PD, SVG TO DIAG. (N/A) CLIPPING OF ATRIAL APPENDAGE USING ATRICURE 88 MM ATRICLIP FLEX-V (N/A) TRANSESOPHAGEAL ECHOCARDIOGRAM (TEE) (N/A) ENDOVEIN HARVEST OF GREATER SAPHENOUS VEIN (Right)  Total Length of Stay:  LOS: 7 days   Subjective: Some SOB, getting breathing RX currently  Objective: Vital signs in last 24 hours: Temp:  [97.3 F (36.3 C)-98 F (36.7 C)] 97.6 F (36.4 C) (10/25 0353) Pulse Rate:  [29-127] 55 (10/25 0530) Cardiac Rhythm: Normal sinus rhythm (10/24 2000) Resp:  [9-22] 11 (10/25 0530) BP: (78-129)/(46-112) 108/59 (10/25 0530) SpO2:  [82 %-100 %] 98 % (10/25 0530) Arterial Line BP: (84-133)/(47-61) 121/59 (10/24 1245) Weight:  [110 kg] 110 kg (10/25 0500)  Filed Weights   08/30/20 0515 08/31/20 0630 09/01/20 0500  Weight: 107.2 kg 108.7 kg 110 kg    Weight change: 1.3 kg   Hemodynamic parameters for last 24 hours:    Intake/Output from previous day: 10/24 0701 - 10/25 0700 In: 386.1 [I.V.:386.1] Out: 295 [Urine:295]  Intake/Output this shift: No intake/output data recorded.  Current Meds: Scheduled Meds: . acetaminophen  1,000 mg Oral Q6H   Or  . acetaminophen (TYLENOL) oral liquid 160 mg/5 mL  1,000 mg Per Tube Q6H  . aspirin EC  325 mg Oral Daily   Or  . aspirin  324 mg Per Tube Daily  . bisacodyl  10 mg Oral Daily   Or  . bisacodyl  10 mg Rectal Daily  . budesonide (PULMICORT) nebulizer solution  1 mg Nebulization BID  . Chlorhexidine Gluconate Cloth  6 each Topical Daily  . docusate sodium  200 mg Oral Daily  . enoxaparin (LOVENOX) injection  30 mg Subcutaneous QHS  . insulin aspart  0-24  Units Subcutaneous Q4H  . insulin detemir  20 Units Subcutaneous Daily  . midodrine  10 mg Oral TID WC  . pantoprazole  40 mg Oral Daily  . sodium chloride flush  10-40 mL Intracatheter Q12H  . sodium chloride flush  3 mL Intravenous Q12H   Continuous Infusions: . sodium chloride Stopped (08/30/20 1039)  . sodium chloride    . sodium chloride 10 mL/hr at 09/01/20 0552  . DOPamine 2.5 mcg/kg/min (08/31/20 1800)  . insulin Stopped (08/30/20 1348)  . lactated ringers 10 mL/hr at 08/30/20 0600  . lactated ringers    . nitroGLYCERIN Stopped (08/29/20 1500)  . norepinephrine (LEVOPHED) Adult infusion Stopped (08/31/20 1516)   PRN Meds:.sodium chloride, dextrose, morphine injection, ondansetron (ZOFRAN) IV, oxyCODONE, sodium chloride flush, sodium chloride flush, traMADol  General appearance: alert, cooperative and no distress Heart: regular rate and rhythm Lungs: dim in bases Abdomen: benign Extremities: no edema Wound: provena in place, EVH site healing well  Lab Results: CBC: Recent Labs    08/31/20 0358 09/01/20 0533  WBC 17.7* 12.3*  HGB 8.5* 8.7*  HCT 26.8* 27.5*  PLT 129* 118*   BMET:  Recent Labs    08/31/20 0358 09/01/20 0533  NA 138 137  K 4.4 4.4  CL  107 107  CO2 21* 22  GLUCOSE 155* 86  BUN 25* 34*  CREATININE 3.00* 3.97*  CALCIUM 7.8* 7.8*    CMET: Lab Results  Component Value Date   WBC 12.3 (H) 09/01/2020   HGB 8.7 (L) 09/01/2020   HCT 27.5 (L) 09/01/2020   PLT 118 (L) 09/01/2020   GLUCOSE 86 09/01/2020   CHOL 119 08/27/2020   TRIG 86 08/27/2020   HDL 40 (L) 08/27/2020   LDLCALC 62 08/27/2020   ALT 38 08/28/2020   AST 25 08/28/2020   NA 137 09/01/2020   K 4.4 09/01/2020   CL 107 09/01/2020   CREATININE 3.97 (H) 09/01/2020   BUN 34 (H) 09/01/2020   CO2 22 09/01/2020   INR 1.4 (H) 08/29/2020   HGBA1C 7.8 (H) 08/19/2020      PT/INR:  Recent Labs    08/29/20 1524  LABPROT 16.7*  INR 1.4*   Radiology: DG CHEST PORT 1  VIEW  Result Date: 09/01/2020 CLINICAL DATA:  CABG. EXAM: PORTABLE CHEST 1 VIEW COMPARISON:  08/31/2020 FINDINGS: 0532 hours. The cardio pericardial silhouette is enlarged. Bibasilar collapse/consolidation is similar to prior. No pneumothorax or substantial pleural effusion. Right IJ sheath remains in place with similar appearance of probable kink proximally. Calcified right subscapular mass lesion again noted. IMPRESSION: 1. No substantial interval change in exam. 2. No pneumothorax or substantial pleural effusion. 3. Bibasilar collapse/consolidation. Electronically Signed   By: Misty Stanley M.D.   On: 09/01/2020 06:49     Assessment/Plan: S/P Procedure(s) (LRB): CORONARY ARTERY BYPASS GRAFTING (CABG) X 5 USING LEFT INTERNAL MAMMARY ARTERY AND ENDOSCOPICALLY HARVESTED RIGHT GREATER SAPHENOUS VEIN. LIMA TO LAD, SVG TO OM SEQ TO CIRC, SVG TO PD, SVG TO DIAG. (N/A) CLIPPING OF ATRIAL APPENDAGE USING ATRICURE 45 MM ATRICLIP FLEX-V (N/A) TRANSESOPHAGEAL ECHOCARDIOGRAM (TEE) (N/A) ENDOVEIN HARVEST OF GREATER SAPHENOUS VEIN (Right)   POD#3 1 HR and BP fairly variable- on low dose dopamine. NE is off Co-Ox 70, sinus rhythm/brady/PAC'S, ? occas atrial flutter/fib 2 sats ok on 3 liters 3 expected ABLA anemia is stable 4 leukocytosis trend improving, no fevers 5 thrombocytopenia, trend slightly worse, 118 K 6 BS control is adeq for hospitalized patient, advance diet 7 CXR is stabe in appearance with bibasilar atx/ASD 8 renal fxn is worse, poor UOP , ? Initiate diuretics/neph consult   John Giovanni PA-C Pager [phone] 09/01/2020 7:35 AM   Continue low dose dopamine  Cr increasing Have waited on heavy diuresis - nephrology to see today  Acute on chronic renal  Injury stage 2  Acute Kidney Injury (any one)  Increase in SCr by > 0.3 within 48 hours  Increase SCr to > 1.5 times baseline  Urine volume < 0.5 ml/kg/h for 6 hrs  ?Stage 1 - Increase in serum creatinine to 1.5 to 1.9 times  baseline, or increase in serum creatinine by ?0.3 mg/dL (?26.5 micromol/L), or reduction in urine output to <0.5 mL/kg per hour for 6 to 12 hours.  ?Stage 2 - Increase in serum creatinine to 2.0 to 2.9 times baseline, or reduction in urine output to <0.5 mL/kg per hour for ?12 hours.  ?Stage 3 - Increase in serum creatinine to 3.0 times baseline, or increase in serum creatinine to ?4.0 mg/dL (?353.6 micromol/L), or reduction in urine output to <0.3 mL/kg per hour for ?24 hours, or anuria for ?12 hours, or the initiation of renal replacement therapy, or, in patients <18 years, decrease in eGFR to <35 mL   Lab  Results  Component Value Date   CREATININE 3.97 (H) 09/01/2020   Estimated Creatinine Clearance: 17 mL/min (A) (by C-G formula based on SCr of 3.97 mg/dL (H)).

## 2020-09-01 NOTE — Op Note (Signed)
[name], [name] MEDICAL RECORD [id number] ACCOUNT [id number] DATE OF BIRTH:08/05/1935 FACILITY: [facility] LOCATION: [facility] PHYSICIAN:Lacinda Curvin Maryruth Bun, MD  OPERATIVE REPORT  DATE OF PROCEDURE:  08/29/2020  PREOPERATIVE DIAGNOSES:   1.  Severe 3-vessel coronary artery disease with recent acute myocardial infarction. 2.  Out of hospital ventricular fibrillation cardiac arrest.  POSTOPERATIVE DIAGNOSES:   1.  Severe 3-vessel coronary artery disease with recent acute myocardial infarction. 2.  Out of hospital ventricular fibrillation cardiac arrest.  SURGICAL PROCEDURE:   1.  Coronary artery bypass grafting x5 with the left internal mammary to the left anterior descending coronary artery, reverse saphenous vein graft to the diagonal coronary artery, sequential reverse saphenous vein graft to the first obtuse marginal and  distal circumflex, reverse saphenous vein graft to the posterior descending using the endoscopically harvested right greater saphenous thigh and calf. 2.  Placement of left atrial clip, AtriCure clip 45 mm [id number], lot number #[id number].  SURGEON:  Lanelle Bal, MD  FIRST ASSISTANT:  Jadene Pierini, [place].  BRIEF HISTORY:  The patient is an 85 year old male who has been followed extensively at the [facility], including for cardiac heart failure and multiple previous stents.  From his records, it appears he has had stents placed x7 in the past.  Most  recent admission was to [facility] after the patient had an out of hospital ventricular fibrillation arrest, was successfully resuscitated, spent several days on the ventilator at [facility].  He also has known severe diabetes, longstanding and  chronic renal insufficiency with baseline creatinine between 1.5 and 2.0.  The patient over a week's time at [place] gradually improved, was extubated, was neurologically intact.  The patient had been on Plavix that was continued while at [facility].  He  underwent cardiac  catheterization earlier in the week of the surgery and was found to have severe 3-vessel coronary artery disease including 99% stenosis of the proximal LAD and diagonal.  The mid-LAD had patent stents in place.  There were extensive  stents in the circumflex coronary artery.  The first obtuse marginal had a 70% stenosis in the midportion of the circumflex.  There were numerous stents with in-stent stenosis of greater than 80%.  The right coronary artery was severely diseased  segmentally through the body of the right with a patent posterior descending.  Echocardiogram suggested a 40% ejection fraction without significant valvular disease.  While in the hospital at [facility], the patient had episodes of second degree block and at  times episodes of slow atrial fibrillation.  For this reason, we decided to place an AtriClip.  With the patient's renal function stabilized between 1.5 and 1.7 and with washout of his Plavix, we discussed proceeding with coronary artery bypass grafting.   His films were reviewed with Dr. Burt Knack in regard to possible angioplasty or further stenting.  Technically, this was feasible, but however, with the patient's very critical LAD and diagonal disease, underlying diabetes and severe 3-vessel disease, it  was felt that the patient would most benefit from coronary artery bypass grafting rather than targeted angioplasty.  Risks and options were discussed with the patient, his wife and daughter in person and they were well aware of the increased risk of  surgical complications due to the patient's age and his diabetes and renal insufficiency, but were willing to proceed, especially considering his original presentation.  DESCRIPTION OF PROCEDURE:  With Swan-Ganz and arterial line monitors in place, the patient underwent general endotracheal anesthesia without incident.  The skin of the  chest and legs was prepped with Betadine and draped in the usual sterile manner.   Appropriate timeout was  performed.  We then proceeded with endoscopic vein harvesting of the right greater saphenous vein from the thigh and upper calf.  Median sternotomy was performed.  The patient did have some edema and bruising in the chest wall  associated with CPR done in the field.  We were able to dissect the left internal mammary artery down as a pedicle graft and the vessel was of good quality and had good free flow.  The vessel was hydrostatically dilated with heparinized saline.  The  pericardium was opened.  The patient was systemically heparinized.  The ascending aorta was cannulated with a 22-French metal- tipped right angle cannula.  A dual stage venous cannula was placed.  The patient was placed on cardiopulmonary bypass 2.4 L  per minute per meter squared.  Renal function was carefully monitored.  An aortic root vent cardioplegia needle was introduced into the ascending aorta.  The patient's body temperature was cooled to 32 degrees.  Aortic crossclamp was applied and 600 mL  of cold blood potassium cardioplegia was administered antegrade with diastolic arrest of the heart, myocardial septal temperatures monitored throughout the crossclamp.  We turned our attention first to the proximal posterior descending coronary artery,  which had an area somewhat thickened, but was suitable for opening and easily admitted a 1.5 mm probe distally.  Using a running 7-0 Prolene, distal anastomosis with a segment of reverse saphenous vein graft was performed.  The heart was then elevated.   The first obtuse marginal vessel was partially intramyocardial.  This vessel was opened and admitted a 1.5 mm probe distally.  Using diamond-type side-to-side anastomosis was carried out.  The distal extent of the same vein was then carried to the distal  circumflex.  The distal circumflex was basically filled with stents, forcing opening the vessel very distally.  The vessel was opened and admitted a 1 mm probe distally, a 1.5 mm probe  proximally.  Using a running 7-0 Prolene, a distal anastomosis was  performed.  We then turned our attention to the diagonal coronary artery, which was opened, was approximately 1.3 to 1.4 mm in size.  Using a running 7-0 Prolene, distal anastomosis was performed.  Attention was then turned to the left anterior  descending coronary artery.  In the midportion of the LAD, there was an easily palpable stent present.  This extended distally into the distal third of the vessel.  Proximal to the stent, the LAD was of suitable size and the anterior wall was soft and  could be opened.  The vessel was opened.  A 1.5 mm probe passed distally.  Using a running 8-0 Prolene, the left internal mammary artery was anastomosed to left anterior descending coronary artery.  Prior to crossclamping, we had measured the left atrial  appendage.  TEE showed no evidence of intraatrial clot.  A 45 mm AtriClip ACHD45, lot #[id number] was then applied to the base of the left atrial appendage.  With the crossclamp still in place, 3 punch aortotomies were performed and each of the 3 vein  grafts were anastomosed to the ascending aorta.  The heart was allowed to passively fill and deair.  The bulldog on the mammary artery was removed with rise in myocardial septal temperature.  The ascending aorta was further deaired and the aortic  crossclamp was removed as we completed the proximal anastomosis.  The patient was rewarmed to  37 degrees.  He had maintained good urine output while on pump with low-dose dopamine running.  Milrinone was added.  The patient spontaneously converted to a  sinus rhythm, which was slow.  He was DDD paced to increase rate, was weaned from cardiopulmonary bypass without difficulty and remained hemodynamically stable on milrinone, low-dose dopamine and transiently on low-dose Levophed.  He was decannulated in  the usual fashion.  Protamine sulfate was administered.  With the operative field hemostatic, atrial and  ventricular pacing wires had been applied, the pericardium was loosely reapproximated.  A left pleural tube and a Blake mediastinal drain were left  in place.  The sternum was then closed with #6 stainless steel wire, the fascia was closed with interrupted 0 Vicryl and running 3-0 Vicryl subcutaneous tissue.  Total crossclamp time was 123 minutes.  Total pump time was 175 minutes.  The patient was  transferred to the surgical intensive care unit for further postoperative care.  VN/NUANCE  D:08/31/2020 T:09/01/2020 JOB:[id number]

## 2020-09-02 ENCOUNTER — Inpatient Hospital Stay (HOSPITAL_COMMUNITY): Payer: Medicare HMO

## 2020-09-02 LAB — CBC
HCT: 28.3 % — ABNORMAL LOW (ref 39.0–52.0)
Hemoglobin: 8.9 g/dL — ABNORMAL LOW (ref 13.0–17.0)
MCH: 29 pg (ref 26.0–34.0)
MCHC: 31.4 g/dL (ref 30.0–36.0)
MCV: 92.2 fL (ref 80.0–100.0)
Platelets: 163 10*3/uL (ref 150–400)
RBC: 3.07 MIL/uL — ABNORMAL LOW (ref 4.22–5.81)
RDW: 14.3 % (ref 11.5–15.5)
WBC: 10.5 10*3/uL (ref 4.0–10.5)
nRBC: 0 % (ref 0.0–0.2)

## 2020-09-02 LAB — BASIC METABOLIC PANEL
Anion gap: 10 (ref 5–15)
BUN: 44 mg/dL — ABNORMAL HIGH (ref 8–23)
CO2: 22 mmol/L (ref 22–32)
Calcium: 7.8 mg/dL — ABNORMAL LOW (ref 8.9–10.3)
Chloride: 104 mmol/L (ref 98–111)
Creatinine, Ser: 4.03 mg/dL — ABNORMAL HIGH (ref 0.61–1.24)
GFR, Estimated: 14 mL/min — ABNORMAL LOW (ref >60)
Glucose, Bld: 161 mg/dL — ABNORMAL HIGH (ref 70–99)
Potassium: 4.3 mmol/L (ref 3.5–5.1)
Sodium: 136 mmol/L (ref 135–145)

## 2020-09-02 LAB — IRON AND TIBC
Iron: 14 ug/dL — ABNORMAL LOW (ref 45–182)
Saturation Ratios: 10 % — ABNORMAL LOW (ref 17.9–39.5)
TIBC: 143 ug/dL — ABNORMAL LOW (ref 250–450)
UIBC: 129 ug/dL

## 2020-09-02 LAB — FERRITIN: Ferritin: 190 ng/mL (ref 24–336)

## 2020-09-02 LAB — GLUCOSE, CAPILLARY
Glucose-Capillary: 137 mg/dL — ABNORMAL HIGH (ref 70–99)
Glucose-Capillary: 169 mg/dL — ABNORMAL HIGH (ref 70–99)
Glucose-Capillary: 119 mg/dL — ABNORMAL HIGH (ref 70–99)
Glucose-Capillary: 121 mg/dL — ABNORMAL HIGH (ref 70–99)

## 2020-09-02 MED ORDER — SODIUM CHLORIDE 0.9% FLUSH: 10.0000 mL | INTRAVENOUS | Status: DC | PRN

## 2020-09-02 MED ORDER — SODIUM CHLORIDE 0.9% FLUSH
10.0000 mL | Freq: Two times a day (BID) | INTRAVENOUS | Status: DC
  Administered 2020-09-02 – 2020-09-04 (×3): 10 mL

## 2020-09-02 MED ORDER — SODIUM CHLORIDE 0.9 % IV SOLN
510.0000 mg | Freq: Once | INTRAVENOUS | Status: AC
  Administered 2020-09-02: 510 mg via INTRAVENOUS
  Filled 2020-09-02: qty 17

## 2020-09-02 NOTE — Progress Notes (Signed)
Patient ID: [name], male   DOB: 1935/04/01, 85 y.o.   MRN: [id number] TCTS DAILY ICU PROGRESS NOTE                   [address]            [place] [address]          [phone]   4 Days Post-Op Procedure(s) (LRB): CORONARY ARTERY BYPASS GRAFTING (CABG) X 5 USING LEFT INTERNAL MAMMARY ARTERY AND ENDOSCOPICALLY HARVESTED RIGHT GREATER SAPHENOUS VEIN. LIMA TO LAD, SVG TO OM SEQ TO CIRC, SVG TO PD, SVG TO DIAG. (N/A) CLIPPING OF ATRIAL APPENDAGE USING ATRICURE 37 MM ATRICLIP FLEX-V (N/A) TRANSESOPHAGEAL ECHOCARDIOGRAM (TEE) (N/A) ENDOVEIN HARVEST OF GREATER SAPHENOUS VEIN (Right)  Total Length of Stay:  LOS: 8 days   Subjective: Awake and alert, stands with help of two   Some sob  Objective: Vital signs in last 24 hours: Temp:  [97.6 F (36.4 C)-98.4 F (36.9 C)] 98.4 F (36.9 C) (10/26 0030) Pulse Rate:  [25-66] 40 (10/26 0500) Cardiac Rhythm: Normal sinus rhythm (10/26 0400) Resp:  [9-20] 14 (10/26 0500) BP: (101-142)/(57-116) 113/60 (10/26 0500) SpO2:  [97 %-100 %] 99 % (10/26 0500) Weight:  [109.8 kg] 109.8 kg (10/26 0500)  Filed Weights   08/31/20 0630 09/01/20 0500 09/02/20 0500  Weight: 108.7 kg 110 kg 109.8 kg    Weight change: -0.2 kg   Hemodynamic parameters for last 24 hours:    Intake/Output from previous day: 10/25 0701 - 10/26 0700 In: 585.4 [P.O.:240; I.V.:345.4] Out: 920 [Urine:885; Drains:35]  Intake/Output this shift: No intake/output data recorded.  Current Meds: Scheduled Meds: . acetaminophen  1,000 mg Oral Q6H   Or  . acetaminophen (TYLENOL) oral liquid 160 mg/5 mL  1,000 mg Per Tube Q6H  . aspirin EC  325 mg Oral Daily   Or  . aspirin  324 mg Per Tube Daily  . bisacodyl  10 mg Oral Daily   Or  . bisacodyl  10 mg Rectal Daily  . budesonide (PULMICORT) nebulizer solution  1 mg Nebulization BID  . Chlorhexidine Gluconate Cloth  6 each Topical Daily  . darbepoetin (ARANESP) injection - NON-DIALYSIS  150 mcg  Subcutaneous Q Mon-1800  . docusate sodium  200 mg Oral Daily  . enoxaparin (LOVENOX) injection  30 mg Subcutaneous QHS  . feeding supplement  237 mL Oral TID BM  . furosemide  40 mg Intravenous Q12H  . insulin aspart  0-24 Units Subcutaneous TID AC & HS  . insulin detemir  20 Units Subcutaneous Daily  . midodrine  10 mg Oral TID WC  . multivitamin with minerals  1 tablet Oral Daily  . pantoprazole  40 mg Oral Daily  . sodium chloride flush  10-40 mL Intracatheter Q12H  . sodium chloride flush  3 mL Intravenous Q12H   Continuous Infusions: . sodium chloride Stopped (08/30/20 1039)  . sodium chloride    . sodium chloride 10 mL/hr at 09/02/20 0600  . DOPamine 2.5 mcg/kg/min (09/02/20 0600)  . insulin Stopped (08/30/20 1348)  . lactated ringers 10 mL/hr at 08/30/20 0600  . lactated ringers    . nitroGLYCERIN Stopped (08/29/20 1500)  . norepinephrine (LEVOPHED) Adult infusion Stopped (08/31/20 1516)   PRN Meds:.sodium chloride, dextrose, morphine injection, ondansetron (ZOFRAN) IV, oxyCODONE, sodium chloride flush, sodium chloride flush, traMADol  General appearance: alert, cooperative and no distress Neurologic: intact Heart: regular rate and rhythm, S1, S2 normal, no murmur, click, rub  or gallop Lungs: diminished breath sounds bibasilar Abdomen: soft, non-tender; bowel sounds normal; no masses,  no organomegaly Extremities: extremities normal, atraumatic, no cyanosis or edema and Homans sign is negative, no sign of DVT Wound: sternum stable, incisional wound vac in place   Lab Results: CBC: Recent Labs    09/01/20 0533 09/02/20 0400  WBC 12.3* 10.5  HGB 8.7* 8.9*  HCT 27.5* 28.3*  PLT 118* 163   BMET:  Recent Labs    09/01/20 0533 09/02/20 0400  NA 137 136  K 4.4 4.3  CL 107 104  CO2 22 22  GLUCOSE 86 161*  BUN 34* 44*  CREATININE 3.97* 4.03*  CALCIUM 7.8* 7.8*    CMET: Lab Results  Component Value Date   WBC 10.5 09/02/2020   HGB 8.9 (L) 09/02/2020   HCT  28.3 (L) 09/02/2020   PLT 163 09/02/2020   GLUCOSE 161 (H) 09/02/2020   CHOL 119 08/27/2020   TRIG 86 08/27/2020   HDL 40 (L) 08/27/2020   LDLCALC 62 08/27/2020   ALT 38 08/28/2020   AST 25 08/28/2020   NA 136 09/02/2020   K 4.3 09/02/2020   CL 104 09/02/2020   CREATININE 4.03 (H) 09/02/2020   BUN 44 (H) 09/02/2020   CO2 22 09/02/2020   INR 1.4 (H) 08/29/2020   HGBA1C 7.8 (H) 08/19/2020      PT/INR: No results for input(s): LABPROT, INR in the last 72 hours. Radiology: No results found.   Assessment/Plan: S/P Procedure(s) (LRB): CORONARY ARTERY BYPASS GRAFTING (CABG) X 5 USING LEFT INTERNAL MAMMARY ARTERY AND ENDOSCOPICALLY HARVESTED RIGHT GREATER SAPHENOUS VEIN. LIMA TO LAD, SVG TO OM SEQ TO CIRC, SVG TO PD, SVG TO DIAG. (N/A) CLIPPING OF ATRIAL APPENDAGE USING ATRICURE 45 MM ATRICLIP FLEX-V (N/A) TRANSESOPHAGEAL ECHOCARDIOGRAM (TEE) (N/A) ENDOVEIN HARVEST OF GREATER SAPHENOUS VEIN (Right) Mobilize Diuresis Diabetes control Renal function has plateaued at cr= 4.0  uop 880/24 hours  On low dose dopamine  Will remove right IJ, move to pic or midline - ned clearance from renal  H/h stable   Grace Isaac 09/02/2020 7:23 AM

## 2020-09-02 NOTE — Progress Notes (Signed)
TCTS Evening Rounds  POD #2 s/p CABG No complaints Making better urine oob to hallway earlier  PE: BP 124/75   Pulse 69   Temp 97.9 F (36.6 C) (Oral)   Resp 11   Ht 5\' 8"  (1.727 m)   Wt 109.8 kg   SpO2 100%   BMI 36.81 kg/m   Alert/oriented CTA RRR (paced)   Intake/Output Summary (Last 24 hours) at 09/02/2020 1643 Last data filed at 09/02/2020 1400 Gross per 24 hour  Intake 570.32 ml  Output 865 ml  Net -294.68 ml    A/P: continue present management. [name] Z. Orvan Seen, [place]

## 2020-09-02 NOTE — Progress Notes (Addendum)
Subjective:  UOP up some on lasix 40 iv BID- he looks a little brighter-  No nausea-  Ate breakfast this AM Objective Vital signs in last 24 hours: Vitals:   09/02/20 0300 09/02/20 0400 09/02/20 0500 09/02/20 0700  BP: (!) 109/57 131/66 113/60   Pulse: 66 (!) 58 (!) 40   Resp: 13 12 14    Temp:    (!) 97.5 F (36.4 C)  TempSrc:    Oral  SpO2: 99% 100% 99%   Weight:   109.8 kg   Height:       Weight change: -0.2 kg  Intake/Output Summary (Last 24 hours) at 09/02/2020 0836 Last data filed at 09/02/2020 0600 Gross per 24 hour  Intake 570.33 ml  Output 920 ml  Net -349.67 ml    Assessment/Plan: 85 year old black male with diabetes and hypertension.  He experienced an out of hospital V. fib arrest-now status post cardiac catheterization as well as 5 vessel CABG 3 days ago.  This has been complicated by acute on chronic renal failure 1.Renal-acute on chronic renal failure.  Not surprising given scenario.  He did have some pre-existing CKD (crt 1.5 to 2) likely secondary to age-related nephrosclerosis versus diabetes and hypertension.  Therefore, he would be at increased risk for kidney injury in this setting.  Creatinine had been rising-  More stable today.  He was oliguric but UOP has increased with minimal lasix (40 IV BID).  Fortunately, today there are no urgent indications for renal replacement therapy.  However, if this does not turnaround in the next 24 to 48 hours he may require.  I did discuss this with him.    Regarding IV access-  Would be appropriate for a midline PICC  2. Hypertension/volume  -blood pressure is reasonable on dopamine.  He is on midodrine as well.  Given that he is a little more short of breath and has a little edema I started him with fairly low dose of IV Lasix - to continue today 3. CV-4 days postop from CABG.  Per CTS and nursing 4. Anemia  -is situational but significant.  Could be contributing to his shortness of breath.   iron stores low, will give iron  and have added ESA    [name]    Labs: Basic Metabolic Panel: Recent Labs  Lab 08/31/20 0358 09/01/20 0533 09/02/20 0400  NA 138 137 136  K 4.4 4.4 4.3  CL 107 107 104  CO2 21* 22 22  GLUCOSE 155* 86 161*  BUN 25* 34* 44*  CREATININE 3.00* 3.97* 4.03*  CALCIUM 7.8* 7.8* 7.8*   Liver Function Tests: Recent Labs  Lab 08/28/20 0647  AST 25  ALT 38  ALKPHOS 59  BILITOT 0.7  PROT 6.2*  ALBUMIN 2.7*   No results for input(s): LIPASE, AMYLASE in the last 168 hours. No results for input(s): AMMONIA in the last 168 hours. CBC: Recent Labs  Lab 08/30/20 0358 08/30/20 0358 08/30/20 1625 08/30/20 1625 08/31/20 0358 09/01/20 0533 09/02/20 0400  WBC 16.2*   < > 22.2*   < > 17.7* 12.3* 10.5  HGB 9.5*   < > 9.2*   < > 8.5* 8.7* 8.9*  HCT 29.8*   < > 28.9*   < > 26.8* 27.5* 28.3*  MCV 90.3  --  91.5  --  92.4 93.9 92.2  PLT 164   < > 154   < > 129* 118* 163   < > = values in this interval not  displayed.   Cardiac Enzymes: No results for input(s): CKTOTAL, CKMB, CKMBINDEX, TROPONINI in the last 168 hours. CBG: Recent Labs  Lab 09/01/20 0754 09/01/20 1109 09/01/20 1526 09/01/20 2204 09/02/20 0652  GLUCAP 78 83 90 130* 137*    Iron Studies:  Recent Labs    09/02/20 0400  IRON 14*  TIBC 143*  FERRITIN 190   Studies/Results: DG Chest Port 1 View  Result Date: 09/02/2020 CLINICAL DATA:  Post heart surgery EXAM: PORTABLE CHEST 1 VIEW COMPARISON:  Portable exam 0612 hours compared to 09/01/2020 FINDINGS: RIGHT jugular line unchanged, kinked at the cervical region. Enlargement of cardiac silhouette post CABG and Maze procedure. Mediastinal contours and pulmonary vascularity stable. Decreased bibasilar atelectasis. No acute infiltrate, pleural effusion or pneumothorax. Calcified RIGHT scapular lesion projects over RIGHT upper lobe. IMPRESSION: Enlargement of cardiac silhouette post CABG and Maze procedure. Decreased bibasilar atelectasis.  Electronically Signed   By: Lavonia Dana M.D.   On: 09/02/2020 08:20   DG CHEST PORT 1 VIEW  Result Date: 09/01/2020 CLINICAL DATA:  CABG. EXAM: PORTABLE CHEST 1 VIEW COMPARISON:  08/31/2020 FINDINGS: 0532 hours. The cardio pericardial silhouette is enlarged. Bibasilar collapse/consolidation is similar to prior. No pneumothorax or substantial pleural effusion. Right IJ sheath remains in place with similar appearance of probable kink proximally. Calcified right subscapular mass lesion again noted. IMPRESSION: 1. No substantial interval change in exam. 2. No pneumothorax or substantial pleural effusion. 3. Bibasilar collapse/consolidation. Electronically Signed   By: Misty Stanley M.D.   On: 09/01/2020 06:49   Medications: Infusions: . sodium chloride Stopped (08/30/20 1039)  . sodium chloride    . sodium chloride 10 mL/hr at 09/02/20 0600  . DOPamine 2.5 mcg/kg/min (09/02/20 0600)  . insulin Stopped (08/30/20 1348)  . lactated ringers 10 mL/hr at 08/30/20 0600  . lactated ringers    . nitroGLYCERIN Stopped (08/29/20 1500)  . norepinephrine (LEVOPHED) Adult infusion Stopped (08/31/20 1516)    Scheduled Medications: . acetaminophen  1,000 mg Oral Q6H   Or  . acetaminophen (TYLENOL) oral liquid 160 mg/5 mL  1,000 mg Per Tube Q6H  . aspirin EC  325 mg Oral Daily   Or  . aspirin  324 mg Per Tube Daily  . bisacodyl  10 mg Oral Daily   Or  . bisacodyl  10 mg Rectal Daily  . budesonide (PULMICORT) nebulizer solution  1 mg Nebulization BID  . Chlorhexidine Gluconate Cloth  6 each Topical Daily  . darbepoetin (ARANESP) injection - NON-DIALYSIS  150 mcg Subcutaneous Q Mon-1800  . docusate sodium  200 mg Oral Daily  . enoxaparin (LOVENOX) injection  30 mg Subcutaneous QHS  . feeding supplement  237 mL Oral TID BM  . furosemide  40 mg Intravenous Q12H  . insulin aspart  0-24 Units Subcutaneous TID AC & HS  . insulin detemir  20 Units Subcutaneous Daily  . midodrine  10 mg Oral TID WC  .  multivitamin with minerals  1 tablet Oral Daily  . pantoprazole  40 mg Oral Daily  . sodium chloride flush  10-40 mL Intracatheter Q12H  . sodium chloride flush  3 mL Intravenous Q12H    have reviewed scheduled and prn medications.  Physical Exam: General: sitting up in bedside chair-  Slow moving but NAD Heart: slow Lungs: poor effort, clear anteriorly Abdomen: obese, distended Extremities: 1+ pitting edema     09/02/2020,8:36 AM  LOS: 8 days

## 2020-09-02 NOTE — Progress Notes (Signed)
Doing well post CABG. Renal dysfunction appears to be reaching a plateau. Urine output OK. Slow progress with activity.   Duane Kiang [place] MD, [facility]
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
## 2020-09-02 NOTE — Discharge Instructions (Signed)
Endoscopic Saphenous Vein Harvesting, Care After This sheet gives you information about how to care for yourself after your procedure. Your health care provider may also give you more specific instructions. If you have problems or questions, contact your health care provider. What can I expect after the procedure? After the procedure, it is common to have:  Pain.  Bruising.  Swelling.  Numbness. Follow these instructions at home: Incision care   Follow instructions from your health care provider about how to take care of your incisions. Make sure you: ? Wash your hands with soap and water before and after you change your bandages (dressings). If soap and water are not available, use hand sanitizer. ? Change your dressings as told by your health care provider. ? Leave stitches (sutures), skin glue, or adhesive strips in place. These skin closures may need to stay in place for 2 weeks or longer. If adhesive strip edges start to loosen and curl up, you may trim the loose edges. Do not remove adhesive strips completely unless your health care provider tells you to do that.  Check your incision areas every day for signs of infection. Check for: ? More redness, swelling, or pain. ? Fluid or blood. ? Warmth. ? Pus or a bad smell. Medicines  Take over-the-counter and prescription medicines only as told by your health care provider.  Ask your health care provider if the medicine prescribed to you requires you to avoid driving or using heavy machinery. General instructions  Raise (elevate) your legs above the level of your heart while you are sitting or lying down.  Avoid crossing your legs.  Avoid sitting for long periods of time. Change positions every 30 minutes.  Do any exercises your health care providers have given you. These may include deep breathing, coughing, and walking exercises.  Do not take baths, swim, or use a hot tub until your health care provider approves. Ask your  health care provider if you may take showers. You may only be allowed to take sponge baths.  Wear compression stockings as told by your health care provider. These stockings help to prevent blood clots and reduce swelling in your legs.  Keep all follow-up visits as told by your health care provider. This is important. Contact a health care provider if:  Medicine does not help your pain.  Your pain gets worse.  You have new leg bruises or your leg bruises get bigger.  Your leg feels numb.  You have more redness, swelling, or pain around your incision.  You have fluid or blood coming from your incision.  Your incision feels warm to the touch.  You have pus or a bad smell coming from your incision.  You have a fever. Get help right away if:  Your pain is severe.  You develop pain, tenderness, warmth, redness, or swelling in any part of your leg.  You have chest pain.  You have trouble breathing. Summary  Raise (elevate) your legs above the level of your heart while you are sitting or lying down.  Wear compression stockings as told by your health care provider.  Make sure you know which symptoms should prompt you to contact your health care provider.  Keep all follow-up visits as told by your health care provider. This information is not intended to replace advice given to you by your health care provider. Make sure you discuss any questions you have with your health care provider. Document Revised: 10/02/2018 Document Reviewed: 10/02/2018 Elsevier Patient Education    2020 [employer] Coronary Artery Bypass Grafting, Care After This sheet gives you information about how to care for yourself after your procedure. Your doctor may also give you more specific instructions. If you have problems or questions, call your doctor. What can I expect after the procedure? After the procedure, it is common to:  Feel sick to your stomach (nauseous).  Not want to eat as much as  normal (lack of appetite).  Have trouble pooping (constipation).  Have weakness and tiredness (fatigue).  Feel sad (depressed) or grouchy (irritable).  Have pain or discomfort around the cuts from surgery (incisions). Follow these instructions at home: Medicines  Take over-the-counter and prescription medicines only as told by your doctor. Do not stop taking medicines or start any new medicines unless your doctor says it is okay.  If you were prescribed an antibiotic medicine, take it as told by your doctor. Do not stop taking the antibiotic even if you start to feel better. Incision care   Follow instructions from your doctor about how to take care of your cuts from surgery. Make sure you: ? Wash your hands with soap and water before and after you change your bandage (dressing). If you cannot use soap and water, use hand sanitizer. ? Change your bandage as told by your doctor. ? Leave stitches (sutures), skin glue, or skin tape (adhesive) strips in place. They may need to stay in place for 2 weeks or longer. If tape strips get loose and curl up, you may trim the loose edges. Do not remove tape strips completely unless your doctor says it is okay.  Make sure the surgery cuts are clean, dry, and protected.  Check your cut areas every day for signs of infection. Check for: ? More redness, swelling, or pain. ? More fluid or blood. ? Warmth. ? Pus or a bad smell.  If cuts were made in your legs: ? Avoid crossing your legs. ? Avoid sitting for long periods of time. Change positions every 30 minutes. ? Raise (elevate) your legs when you are sitting. Bathing  Do not take baths, swim, or use a hot tub until your doctor says it is okay.  You may shower. Pat the surgery cuts dry. Do not rub the cuts to dry.  Eating and drinking   Eat foods that are high in fiber, such as beans, nuts, whole grains, and raw fruits and vegetables. Any meats you eat should be lean cut. Avoid canned,  processed, and fried foods. This can help prevent trouble pooping. This is also a part of a heart-healthy diet.  Drink enough fluid to keep your pee (urine) pale yellow.  Do not drink alcohol until you are fully recovered. Ask your doctor when it is safe to drink alcohol. Activity  Rest and limit your activity as told by your doctor. You may be told to: ? Stop any activity right away if you have chest pain, shortness of breath, irregular heartbeats, or dizziness. Get help right away if you have any of these symptoms. ? Move around often for short periods or take short walks as told by your doctor. Slowly increase your activities. ? Avoid lifting, pushing, or pulling anything that is heavier than 10 lb (4.5 kg) for at least 6 weeks or as told by your doctor.  Do physical therapy or a cardiac rehab (cardiac rehabilitation) program as told by your doctor. ? Physical therapy involves doing exercises to maintain movement and build strength and endurance. ? A cardiac rehab   program includes:  Exercise training.  Education.  Counseling.  Do not drive until your doctor says it is okay.  Ask your doctor when you can go back to work.  Ask your doctor when you can be sexually active. General instructions  Do not drive or use heavy machinery while taking prescription pain medicine.  Do not use any products that contain nicotine or tobacco. These include cigarettes, e-cigarettes, and chewing tobacco. If you need help quitting, ask your doctor.  Take 2-3 deep breaths every few hours during the day while you get better. This helps expand your lungs and prevent problems.  If you were given a device called an incentive spirometer, use it several times a day to practice deep breathing. Support your chest with a pillow or your arms when you take deep breaths or cough.  Wear compression stockings as told by your doctor.  Weigh yourself every day. This helps to see if your body is holding  (retaining) fluid that may make your heart and lungs work harder.  Keep all follow-up visits as told by your doctor. This is important. Contact a doctor if:  You have more redness, swelling, or pain around any cut.  You have more fluid or blood coming from any cut.  Any cut feels warm to the touch.  You have pus or a bad smell coming from any cut.  You have a fever.  You have swelling in your ankles or legs.  You have pain in your legs.  You gain 2 lb (0.9 kg) or more a day.  You feel sick to your stomach or you throw up (vomit).  You have watery poop (diarrhea). Get help right away if:  You have chest pain that goes to your jaw or arms.  You are short of breath.  You have a fast or irregular heartbeat.  You notice a "clicking" in your breastbone (sternum) when you move.  You have any signs of a stroke. "BE FAST" is an easy way to remember the main warning signs: ? B - Balance. Signs are dizziness, sudden trouble walking, or loss of balance. ? E - Eyes. Signs are trouble seeing or a change in how you see. ? F - Face. Signs are sudden weakness or loss of feeling of the face, or the face or eyelid drooping on one side. ? A - Arms. Signs are weakness or loss of feeling in an arm. This happens suddenly and usually on one side of the body. ? S - Speech. Signs are sudden trouble speaking, slurred speech, or trouble understanding what people say. ? T - Time. Time to call emergency services. Write down what time symptoms started.  You have other signs of a stroke, such as: ? A sudden, very bad headache with no known cause. ? Feeling sick to your stomach. ? Throwing up. ? Jerky movements you cannot control (seizure). These symptoms may be an emergency. Do not wait to see if the symptoms will go away. Get medical help right away. Call your local emergency services (911 in the [place] Do not drive yourself to the hospital. Summary  After the procedure, it is common to have pain  or discomfort in the cuts from surgery (incisions).  Do not take baths, swim, or use a hot tub until your doctor says it is okay.  Slowly increase your activities. You may need physical therapy or cardiac rehab.  Weigh yourself every day. This helps to see if your body is holding fluid. This   information is not intended to replace advice given to you by your health care provider. Make sure you discuss any questions you have with your health care provider. Document Revised: 07/04/2018 Document Reviewed: 07/04/2018 Elsevier Patient Education  2020 [employer]  

## 2020-09-03 ENCOUNTER — Inpatient Hospital Stay (HOSPITAL_COMMUNITY): Payer: Medicare HMO

## 2020-09-03 LAB — GLUCOSE, CAPILLARY
Glucose-Capillary: 93 mg/dL (ref 70–99)
Glucose-Capillary: 136 mg/dL — ABNORMAL HIGH (ref 70–99)
Glucose-Capillary: 131 mg/dL — ABNORMAL HIGH (ref 70–99)
Glucose-Capillary: 84 mg/dL (ref 70–99)

## 2020-09-03 LAB — CBC
HCT: 28.9 % — ABNORMAL LOW (ref 39.0–52.0)
Hemoglobin: 9.3 g/dL — ABNORMAL LOW (ref 13.0–17.0)
MCH: 29.2 pg (ref 26.0–34.0)
MCHC: 32.2 g/dL (ref 30.0–36.0)
MCV: 90.6 fL (ref 80.0–100.0)
Platelets: 207 10*3/uL (ref 150–400)
RBC: 3.19 MIL/uL — ABNORMAL LOW (ref 4.22–5.81)
RDW: 14.2 % (ref 11.5–15.5)
WBC: 8.3 10*3/uL (ref 4.0–10.5)
nRBC: 0 % (ref 0.0–0.2)

## 2020-09-03 LAB — BASIC METABOLIC PANEL
Anion gap: 11 (ref 5–15)
BUN: 46 mg/dL — ABNORMAL HIGH (ref 8–23)
CO2: 21 mmol/L — ABNORMAL LOW (ref 22–32)
Calcium: 8 mg/dL — ABNORMAL LOW (ref 8.9–10.3)
Chloride: 104 mmol/L (ref 98–111)
Creatinine, Ser: 3.78 mg/dL — ABNORMAL HIGH (ref 0.61–1.24)
GFR, Estimated: 15 mL/min — ABNORMAL LOW (ref >60)
Glucose, Bld: 90 mg/dL (ref 70–99)
Potassium: 4.1 mmol/L (ref 3.5–5.1)
Sodium: 136 mmol/L (ref 135–145)

## 2020-09-03 MED FILL — Mannitol IV Soln 20%: INTRAVENOUS | Qty: 500 | Status: AC

## 2020-09-03 MED FILL — Heparin Sodium (Porcine) Inj 1000 Unit/ML: INTRAMUSCULAR | Qty: 10 | Status: AC

## 2020-09-03 MED FILL — Electrolyte-R (PH 7.4) Solution: INTRAVENOUS | Qty: 4000 | Status: AC

## 2020-09-03 MED FILL — Sodium Bicarbonate IV Soln 8.4%: INTRAVENOUS | Qty: 50 | Status: AC

## 2020-09-03 MED FILL — Lidocaine HCl Local Soln Prefilled Syringe 100 MG/5ML (2%): INTRAMUSCULAR | Qty: 5 | Status: AC

## 2020-09-03 MED FILL — Sodium Chloride IV Soln 0.9%: INTRAVENOUS | Qty: 2000 | Status: AC

## 2020-09-03 NOTE — Progress Notes (Signed)
Subjective:  UOP up some more lasix 40 iv BID- he looks a little brighter-  No nausea but still a little short winded-  Ate breakfast this AM- crt down a little  Objective Vital signs in last 24 hours: Vitals:   09/03/20 0600 09/03/20 0700 09/03/20 0800 09/03/20 0817  BP: 113/74 123/71 113/70   Pulse: 81 80 80   Resp: 14 11 11    Temp:      TempSrc:      SpO2: 98% 94% 96% 98%  Weight:      Height:       Weight change: -2.2 kg  Intake/Output Summary (Last 24 hours) at 09/03/2020 0843 Last data filed at 09/03/2020 0700 Gross per 24 hour  Intake 1054.19 ml  Output 2145 ml  Net -1090.81 ml    Assessment/Plan: 85 year old black male with diabetes and hypertension.  He experienced an out of hospital V. fib arrest-now status post cardiac catheterization as well as 5 vessel CABG 3 days ago.  This has been complicated by acute on chronic renal failure 1.Renal-acute on chronic renal failure.  Not surprising given scenario.  He did have some pre-existing CKD (crt 1.5 to 2) likely secondary to age-related nephrosclerosis versus diabetes and hypertension.  Therefore, he would be at increased risk for kidney injury in this setting.  Creatinine had been rising-   Stable yesterday, then down a little today.  He was oliguric but UOP has increased with minimal lasix (40 IV BID).  Fortunately, there have been no indications for renal replacement therapy and he now seems to be improving.  I am OK with d/c of foley  Regarding IV access-  Would be appropriate for a midline PICC  2. Hypertension/volume  -blood pressure is reasonable on dopamine.  He is on midodrine as well.  Given that he is a little more short of breath and has a little edema I started him with fairly low dose of IV Lasix - to continue today 3. CV-4 days postop from CABG.  Per CTS and nursing 4. Anemia  -is situational but significant.  Could be contributing to his shortness of breath.   iron stores low, will give iron and have added  ESA    [name]    Labs: Basic Metabolic Panel: Recent Labs  Lab 09/01/20 0533 09/02/20 0400 09/03/20 0412  NA 137 136 136  K 4.4 4.3 4.1  CL 107 104 104  CO2 22 22 21*  GLUCOSE 86 161* 90  BUN 34* 44* 46*  CREATININE 3.97* 4.03* 3.78*  CALCIUM 7.8* 7.8* 8.0*   Liver Function Tests: Recent Labs  Lab 08/28/20 0647  AST 25  ALT 38  ALKPHOS 59  BILITOT 0.7  PROT 6.2*  ALBUMIN 2.7*   No results for input(s): LIPASE, AMYLASE in the last 168 hours. No results for input(s): AMMONIA in the last 168 hours. CBC: Recent Labs  Lab 08/30/20 1625 08/30/20 1625 08/31/20 0358 08/31/20 0358 09/01/20 0533 09/02/20 0400 09/03/20 0412  WBC 22.2*   < > 17.7*   < > 12.3* 10.5 8.3  HGB 9.2*   < > 8.5*   < > 8.7* 8.9* 9.3*  HCT 28.9*   < > 26.8*   < > 27.5* 28.3* 28.9*  MCV 91.5  --  92.4  --  93.9 92.2 90.6  PLT 154   < > 129*   < > 118* 163 207   < > = values in this interval not displayed.   Cardiac  Enzymes: No results for input(s): CKTOTAL, CKMB, CKMBINDEX, TROPONINI in the last 168 hours. CBG: Recent Labs  Lab 09/02/20 0652 09/02/20 1130 09/02/20 1609 09/02/20 2136 09/03/20 0659  GLUCAP 137* 169* 119* 121* 93    Iron Studies:  Recent Labs    09/02/20 0400  IRON 14*  TIBC 143*  FERRITIN 190   Studies/Results: DG Chest Port 1 View  Result Date: 09/03/2020 CLINICAL DATA:  Chest tube. EXAM: PORTABLE CHEST 1 VIEW COMPARISON:  September 02, 2020. FINDINGS: Stable cardiomegaly. No pneumothorax is noted. Right lung is clear. Stable left basilar atelectasis is noted with possible small left pleural effusion. Bony thorax is unremarkable. IMPRESSION: Stable left basilar atelectasis with possible small left pleural effusion. No pneumothorax is noted. Electronically Signed   By: Marijo Conception M.D.   On: 09/03/2020 08:15   DG Chest Port 1 View  Result Date: 09/02/2020 CLINICAL DATA:  Post heart surgery EXAM: PORTABLE CHEST 1 VIEW COMPARISON:  Portable  exam 0612 hours compared to 09/01/2020 FINDINGS: RIGHT jugular line unchanged, kinked at the cervical region. Enlargement of cardiac silhouette post CABG and Maze procedure. Mediastinal contours and pulmonary vascularity stable. Decreased bibasilar atelectasis. No acute infiltrate, pleural effusion or pneumothorax. Calcified RIGHT scapular lesion projects over RIGHT upper lobe. IMPRESSION: Enlargement of cardiac silhouette post CABG and Maze procedure. Decreased bibasilar atelectasis. Electronically Signed   By: Lavonia Dana M.D.   On: 09/02/2020 08:20   Medications: Infusions: . sodium chloride Stopped (08/30/20 1039)  . sodium chloride    . sodium chloride 10 mL/hr at 09/03/20 0700  . DOPamine 2 mcg/kg/min (09/03/20 0810)  . insulin Stopped (08/30/20 1348)  . lactated ringers 10 mL/hr at 08/30/20 0600  . lactated ringers    . nitroGLYCERIN Stopped (08/29/20 1500)    Scheduled Medications: . acetaminophen  1,000 mg Oral Q6H   Or  . acetaminophen (TYLENOL) oral liquid 160 mg/5 mL  1,000 mg Per Tube Q6H  . aspirin EC  325 mg Oral Daily   Or  . aspirin  324 mg Per Tube Daily  . bisacodyl  10 mg Oral Daily   Or  . bisacodyl  10 mg Rectal Daily  . budesonide (PULMICORT) nebulizer solution  1 mg Nebulization BID  . Chlorhexidine Gluconate Cloth  6 each Topical Daily  . darbepoetin (ARANESP) injection - NON-DIALYSIS  150 mcg Subcutaneous Q Mon-1800  . docusate sodium  200 mg Oral Daily  . enoxaparin (LOVENOX) injection  30 mg Subcutaneous QHS  . feeding supplement  237 mL Oral TID BM  . furosemide  40 mg Intravenous Q12H  . insulin aspart  0-24 Units Subcutaneous TID AC & HS  . insulin detemir  20 Units Subcutaneous Daily  . midodrine  10 mg Oral TID WC  . multivitamin with minerals  1 tablet Oral Daily  . pantoprazole  40 mg Oral Daily  . sodium chloride flush  10-40 mL Intracatheter Q12H  . sodium chloride flush  10-40 mL Intracatheter Q12H  . sodium chloride flush  3 mL Intravenous  Q12H    have reviewed scheduled and prn medications.  Physical Exam: General: sitting up in bedside chair-  Slow moving but NAD Heart: slow Lungs: poor effort, clear anteriorly Abdomen: obese, distended Extremities: 1+ pitting edema     09/03/2020,8:43 AM  LOS: 9 days

## 2020-09-03 NOTE — Progress Notes (Addendum)
Patient ID: [name], male   DOB: [birth year], 85 y.o.   MRN: [id number] [facility] DAILY ICU PROGRESS NOTE                   [address]            [place] [address]          [phone]   5 Days Post-Op Procedure(s) (LRB): CORONARY ARTERY BYPASS GRAFTING (CABG) X 5 USING LEFT INTERNAL MAMMARY ARTERY AND ENDOSCOPICALLY HARVESTED RIGHT GREATER SAPHENOUS VEIN. LIMA TO LAD, SVG TO OM SEQ TO CIRC, SVG TO PD, SVG TO DIAG. (N/A) CLIPPING OF ATRIAL APPENDAGE USING ATRICURE 22 MM ATRICLIP FLEX-V (N/A) TRANSESOPHAGEAL ECHOCARDIOGRAM (TEE) (N/A) ENDOVEIN HARVEST OF GREATER SAPHENOUS VEIN (Right)  Total Length of Stay:  LOS: 9 days   Subjective: Patient up to chair awake alert neurologically intact, currently on room air  Objective: Vital signs in last 24 hours: Temp:  [97.6 F (36.4 C)-97.9 F (36.6 C)] 97.8 F (36.6 C) (10/27 0400) Pulse Rate:  [51-81] 80 (10/27 0700) Cardiac Rhythm: Ventricular paced (10/27 0400) Resp:  [9-25] 11 (10/27 0700) BP: (106-142)/(64-92) 123/71 (10/27 0700) SpO2:  [92 %-100 %] 94 % (10/27 0700) Weight:  [107.6 kg] 107.6 kg (10/27 0500)  Filed Weights   09/01/20 0500 09/02/20 0500 09/03/20 0500  Weight: 110 kg 109.8 kg 107.6 kg    Weight change: -2.2 kg   Hemodynamic parameters for last 24 hours:    Intake/Output from previous day: 10/26 0701 - 10/27 0700 In: 1324.3 [P.O.:957; I.V.:367.3] Out: 2185 [Urine:2185]  Intake/Output this shift: No intake/output data recorded.  Current Meds: Scheduled Meds: . acetaminophen  1,000 mg Oral Q6H   Or  . acetaminophen (TYLENOL) oral liquid 160 mg/5 mL  1,000 mg Per Tube Q6H  . aspirin EC  325 mg Oral Daily   Or  . aspirin  324 mg Per Tube Daily  . bisacodyl  10 mg Oral Daily   Or  . bisacodyl  10 mg Rectal Daily  . budesonide (PULMICORT) nebulizer solution  1 mg Nebulization BID  . Chlorhexidine Gluconate Cloth  6 each Topical Daily  . darbepoetin (ARANESP) injection - NON-DIALYSIS   150 mcg Subcutaneous Q Mon-1800  . docusate sodium  200 mg Oral Daily  . enoxaparin (LOVENOX) injection  30 mg Subcutaneous QHS  . feeding supplement  237 mL Oral TID BM  . furosemide  40 mg Intravenous Q12H  . insulin aspart  0-24 Units Subcutaneous TID AC & HS  . insulin detemir  20 Units Subcutaneous Daily  . midodrine  10 mg Oral TID WC  . multivitamin with minerals  1 tablet Oral Daily  . pantoprazole  40 mg Oral Daily  . sodium chloride flush  10-40 mL Intracatheter Q12H  . sodium chloride flush  10-40 mL Intracatheter Q12H  . sodium chloride flush  3 mL Intravenous Q12H   Continuous Infusions: . sodium chloride Stopped (08/30/20 1039)  . sodium chloride    . sodium chloride 10 mL/hr at 09/03/20 0700  . DOPamine 2.5 mcg/kg/min (09/03/20 0700)  . insulin Stopped (08/30/20 1348)  . lactated ringers 10 mL/hr at 08/30/20 0600  . lactated ringers    . nitroGLYCERIN Stopped (08/29/20 1500)  . norepinephrine (LEVOPHED) Adult infusion Stopped (08/31/20 1516)   PRN Meds:.sodium chloride, dextrose, morphine injection, ondansetron (ZOFRAN) IV, oxyCODONE, sodium chloride flush, sodium chloride flush, sodium chloride flush, traMADol  General appearance: alert and cooperative Neurologic: intact Heart: Paced DDD  Lungs: diminished breath sounds bibasilar Abdomen: soft, non-tender; bowel sounds normal; no masses,  no organomegaly Extremities: extremities normal, atraumatic, no cyanosis or edema and Homans sign is negative, no sign of DVT Wound: Incisional wound VAC in place  Lab Results: CBC: Recent Labs    09/02/20 0400 09/03/20 0412  WBC 10.5 8.3  HGB 8.9* 9.3*  HCT 28.3* 28.9*  PLT 163 207   BMET:  Recent Labs    09/02/20 0400 09/03/20 0412  NA 136 136  K 4.3 4.1  CL 104 104  CO2 22 21*  GLUCOSE 161* 90  BUN 44* 46*  CREATININE 4.03* 3.78*  CALCIUM 7.8* 8.0*    CMET: Lab Results  Component Value Date   WBC 8.3 09/03/2020   HGB 9.3 (L) 09/03/2020   HCT 28.9 (L)  09/03/2020   PLT 207 09/03/2020   GLUCOSE 90 09/03/2020   CHOL 119 08/27/2020   TRIG 86 08/27/2020   HDL 40 (L) 08/27/2020   LDLCALC 62 08/27/2020   ALT 38 08/28/2020   AST 25 08/28/2020   NA 136 09/03/2020   K 4.1 09/03/2020   CL 104 09/03/2020   CREATININE 3.78 (H) 09/03/2020   BUN 46 (H) 09/03/2020   CO2 21 (L) 09/03/2020   INR 1.4 (H) 08/29/2020   HGBA1C 7.8 (H) 08/19/2020      PT/INR: No results for input(s): LABPROT, INR in the last 72 hours. Radiology: No results found.   Assessment/Plan: S/P Procedure(s) (LRB): CORONARY ARTERY BYPASS GRAFTING (CABG) X 5 USING LEFT INTERNAL MAMMARY ARTERY AND ENDOSCOPICALLY HARVESTED RIGHT GREATER SAPHENOUS VEIN. LIMA TO LAD, SVG TO OM SEQ TO CIRC, SVG TO PD, SVG TO DIAG. (N/A) CLIPPING OF ATRIAL APPENDAGE USING ATRICURE 82 MM ATRICLIP FLEX-V (N/A) TRANSESOPHAGEAL ECHOCARDIOGRAM (TEE) (N/A) ENDOVEIN HARVEST OF GREATER SAPHENOUS VEIN (Right) Mobilize Patient is slowly gaining strength increasing his activity level Creatinine has started to decrease now, with significant increase in volume of urine output on Lasix 40 mg twice a day IV DDD paced at rate of 80, underlying rhythm was getting into the low 60s and half 50s-  Hemoglobin stable Currently on low-dose dopamine-continue another 24 hours and DC We will plan to DC [facility] check bladder scans to make sure he does not develop urinary retention-is satisfactory with renal service   Grace Isaac 09/03/2020 7:48 AM

## 2020-09-03 NOTE — Plan of Care (Signed)
  Problem: Education: Goal: Ability to demonstrate management of disease process will improve Outcome: Progressing Goal: Ability to verbalize understanding of medication therapies will improve Outcome: Progressing Goal: Individualized Educational Video(s) Outcome: Progressing   Problem: Activity: Goal: Capacity to carry out activities will improve Outcome: Progressing   Problem: Cardiac: Goal: Ability to achieve and maintain adequate cardiopulmonary perfusion will improve Outcome: Progressing   Problem: Education: Goal: Knowledge of General Education information will improve Description: Including pain rating scale, medication(s)/side effects and non-pharmacologic comfort measures Outcome: Progressing   Problem: Health Behavior/Discharge Planning: Goal: Ability to manage health-related needs will improve Outcome: Progressing   Problem: Clinical Measurements: Goal: Ability to maintain clinical measurements within normal limits will improve Outcome: Progressing Goal: Will remain free from infection Outcome: Progressing Goal: Diagnostic test results will improve Outcome: Progressing Goal: Respiratory complications will improve Outcome: Progressing Goal: Cardiovascular complication will be avoided Outcome: Progressing   Problem: Activity: Goal: Risk for activity intolerance will decrease Outcome: Progressing   Problem: Nutrition: Goal: Adequate nutrition will be maintained Outcome: Progressing   Problem: Coping: Goal: Level of anxiety will decrease Outcome: Progressing   Problem: Elimination: Goal: Will not experience complications related to bowel motility Outcome: Progressing Goal: Will not experience complications related to urinary retention Outcome: Progressing   Problem: Pain Managment: Goal: General experience of comfort will improve Outcome: Progressing   Problem: Safety: Goal: Ability to remain free from injury will improve Outcome: Progressing    Problem: Skin Integrity: Goal: Risk for impaired skin integrity will decrease Outcome: Progressing   Problem: Education: Goal: Will demonstrate proper wound care and an understanding of methods to prevent future damage Outcome: Progressing Goal: Knowledge of disease or condition will improve Outcome: Progressing Goal: Knowledge of the prescribed therapeutic regimen will improve Outcome: Progressing Goal: Individualized Educational Video(s) Outcome: Progressing   Problem: Activity: Goal: Risk for activity intolerance will decrease Outcome: Progressing   Problem: Cardiac: Goal: Will achieve and/or maintain hemodynamic stability Outcome: Progressing   Problem: Clinical Measurements: Goal: Postoperative complications will be avoided or minimized Outcome: Progressing   Problem: Respiratory: Goal: Respiratory status will improve Outcome: Progressing   Problem: Skin Integrity: Goal: Wound healing without signs and symptoms of infection Outcome: Progressing Goal: Risk for impaired skin integrity will decrease Outcome: Progressing   Problem: Urinary Elimination: Goal: Ability to achieve and maintain adequate renal perfusion and functioning will improve Outcome: Progressing

## 2020-09-03 NOTE — Progress Notes (Signed)
Patient ID: [name], male   DOB: 21-Mar-1935, 85 y.o.   MRN: [id number] TCTS Evening Rounds:  Hemodynamically stable on dop 2 mcg Good urine output today. Creat trending down this am.

## 2020-09-04 DIAGNOSIS — Z951 Presence of aortocoronary bypass graft: Secondary | ICD-10-CM

## 2020-09-04 LAB — CBC
HCT: 29.1 % — ABNORMAL LOW (ref 39.0–52.0)
Hemoglobin: 9.3 g/dL — ABNORMAL LOW (ref 13.0–17.0)
MCH: 29.6 pg (ref 26.0–34.0)
MCHC: 32 g/dL (ref 30.0–36.0)
MCV: 92.7 fL (ref 80.0–100.0)
Platelets: 259 10*3/uL (ref 150–400)
RBC: 3.14 MIL/uL — ABNORMAL LOW (ref 4.22–5.81)
RDW: 14.3 % (ref 11.5–15.5)
WBC: 6.9 10*3/uL (ref 4.0–10.5)
nRBC: 0 % (ref 0.0–0.2)

## 2020-09-04 LAB — BASIC METABOLIC PANEL
Anion gap: 9 (ref 5–15)
BUN: 42 mg/dL — ABNORMAL HIGH (ref 8–23)
CO2: 25 mmol/L (ref 22–32)
Calcium: 8 mg/dL — ABNORMAL LOW (ref 8.9–10.3)
Chloride: 105 mmol/L (ref 98–111)
Creatinine, Ser: 3.03 mg/dL — ABNORMAL HIGH (ref 0.61–1.24)
GFR, Estimated: 20 mL/min — ABNORMAL LOW (ref >60)
Glucose, Bld: 130 mg/dL — ABNORMAL HIGH (ref 70–99)
Potassium: 3.7 mmol/L (ref 3.5–5.1)
Sodium: 139 mmol/L (ref 135–145)

## 2020-09-04 LAB — GLUCOSE, CAPILLARY
Glucose-Capillary: 100 mg/dL — ABNORMAL HIGH (ref 70–99)
Glucose-Capillary: 174 mg/dL — ABNORMAL HIGH (ref 70–99)
Glucose-Capillary: 129 mg/dL — ABNORMAL HIGH (ref 70–99)
Glucose-Capillary: 60 mg/dL — ABNORMAL LOW (ref 70–99)
Glucose-Capillary: 62 mg/dL — ABNORMAL LOW (ref 70–99)
Glucose-Capillary: 69 mg/dL — ABNORMAL LOW (ref 70–99)
Glucose-Capillary: 138 mg/dL — ABNORMAL HIGH (ref 70–99)

## 2020-09-04 MED ORDER — POTASSIUM CHLORIDE CRYS ER 20 MEQ PO TBCR
20.0000 meq | EXTENDED_RELEASE_TABLET | ORAL | Status: AC
  Administered 2020-09-04 (×3): 20 meq via ORAL
  Filled 2020-09-04 (×3): qty 1

## 2020-09-04 MED ORDER — ROSUVASTATIN CALCIUM 20 MG PO TABS
40.0000 mg | ORAL_TABLET | Freq: Every day | ORAL | Status: DC
  Administered 2020-09-04 – 2020-09-10 (×7): 40 mg via ORAL
  Filled 2020-09-04 (×7): qty 2
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
## 2020-09-04 NOTE — Progress Notes (Signed)
Subjective:  UOP 3 liters on lasix 40 iv BID- he looks a little brighter-  No nausea but still a little short winded-  Ate breakfast this AM- crt down some more  Objective Vital signs in last 24 hours: Vitals:   09/04/20 0300 09/04/20 0400 09/04/20 0500 09/04/20 0600  BP: 122/77 124/73 124/72 131/80  Pulse: 80 79 79 79  Resp: 11 13 11 18   Temp:  98.3 F (36.8 C)    TempSrc:  Oral    SpO2: 99% 100% 100% 95%  Weight:   107.5 kg   Height:       Weight change: -0.1 kg  Intake/Output Summary (Last 24 hours) at 09/04/2020 0726 Last data filed at 09/04/2020 0600 Gross per 24 hour  Intake 1043.81 ml  Output 3200 ml  Net -2156.19 ml    Assessment/Plan: 85 year old black male with diabetes and hypertension.  He experienced an out of hospital V. fib arrest-now status post cardiac catheterization as well as 5 vessel CABG 3 days ago.  This has been complicated by acute on chronic renal failure 1.Renal-acute on chronic renal failure.  Not surprising given scenario.  He did have some pre-existing CKD (crt 1.5 to 2) likely secondary to age-related nephrosclerosis versus diabetes and hypertension.  Therefore, he would be at increased risk for kidney injury in this setting.  Creatinine had been rising-   Stable Wed, then trending down.  He was oliguric but UOP has increased with minimal lasix (40 IV BID).  Fortunately, there have been no indications for renal replacement therapy and he now seems to be improving.    Regarding IV access-  Would be appropriate for a midline PICC  2. Hypertension/volume  -blood pressure is reasonable on dopamine- going to stop today.  He is on midodrine as well.  Given that he was a little more short of breath and had a little edema I started him with fairly low dose of IV Lasix - is 3 liters negative last 2 days, am going to hold scheduled lasix and just do PRN 3. CV-5 days postop from CABG.  Per CTS and nursing 4. Anemia  -is situational but significant.  Could be  contributing to his shortness of breath.   iron stores low, gave iron and have added ESA- is stable     [name]    Labs: Basic Metabolic Panel: Recent Labs  Lab 09/02/20 0400 09/03/20 0412 09/04/20 0407  NA 136 136 139  K 4.3 4.1 3.7  CL 104 104 105  CO2 22 21* 25  GLUCOSE 161* 90 130*  BUN 44* 46* 42*  CREATININE 4.03* 3.78* 3.03*  CALCIUM 7.8* 8.0* 8.0*   Liver Function Tests: No results for input(s): AST, ALT, ALKPHOS, BILITOT, PROT, ALBUMIN in the last 168 hours. No results for input(s): LIPASE, AMYLASE in the last 168 hours. No results for input(s): AMMONIA in the last 168 hours. CBC: Recent Labs  Lab 08/31/20 0358 08/31/20 0358 09/01/20 0533 09/01/20 0533 09/02/20 0400 09/03/20 0412 09/04/20 0407  WBC 17.7*   < > 12.3*   < > 10.5 8.3 6.9  HGB 8.5*   < > 8.7*   < > 8.9* 9.3* 9.3*  HCT 26.8*   < > 27.5*   < > 28.3* 28.9* 29.1*  MCV 92.4  --  93.9  --  92.2 90.6 92.7  PLT 129*   < > 118*   < > 163 207 259   < > = values in this interval  not displayed.   Cardiac Enzymes: No results for input(s): CKTOTAL, CKMB, CKMBINDEX, TROPONINI in the last 168 hours. CBG: Recent Labs  Lab 09/03/20 0659 09/03/20 1119 09/03/20 1559 09/03/20 2145 09/04/20 0640  GLUCAP 93 136* 131* 84 100*    Iron Studies:  Recent Labs    09/02/20 0400  IRON 14*  TIBC 143*  FERRITIN 190   Studies/Results: DG Chest Port 1 View  Result Date: 09/03/2020 CLINICAL DATA:  Chest tube. EXAM: PORTABLE CHEST 1 VIEW COMPARISON:  September 02, 2020. FINDINGS: Stable cardiomegaly. No pneumothorax is noted. Right lung is clear. Stable left basilar atelectasis is noted with possible small left pleural effusion. Bony thorax is unremarkable. IMPRESSION: Stable left basilar atelectasis with possible small left pleural effusion. No pneumothorax is noted. Electronically Signed   By: Marijo Conception M.D.   On: 09/03/2020 08:15   Medications: Infusions: . sodium chloride Stopped  (08/30/20 1039)  . sodium chloride    . sodium chloride 10 mL/hr at 09/04/20 0600  . DOPamine 2 mcg/kg/min (09/04/20 0600)  . insulin Stopped (08/30/20 1348)  . lactated ringers 10 mL/hr at 08/30/20 0600  . lactated ringers    . nitroGLYCERIN Stopped (08/29/20 1500)    Scheduled Medications: . aspirin EC  325 mg Oral Daily   Or  . aspirin  324 mg Per Tube Daily  . bisacodyl  10 mg Oral Daily   Or  . bisacodyl  10 mg Rectal Daily  . budesonide (PULMICORT) nebulizer solution  1 mg Nebulization BID  . Chlorhexidine Gluconate Cloth  6 each Topical Daily  . darbepoetin (ARANESP) injection - NON-DIALYSIS  150 mcg Subcutaneous Q Mon-1800  . docusate sodium  200 mg Oral Daily  . enoxaparin (LOVENOX) injection  30 mg Subcutaneous QHS  . feeding supplement  237 mL Oral TID BM  . furosemide  40 mg Intravenous Q12H  . insulin aspart  0-24 Units Subcutaneous TID AC & HS  . insulin detemir  20 Units Subcutaneous Daily  . midodrine  10 mg Oral TID WC  . multivitamin with minerals  1 tablet Oral Daily  . pantoprazole  40 mg Oral Daily  . potassium chloride  20 mEq Oral Q4H  . sodium chloride flush  10-40 mL Intracatheter Q12H  . sodium chloride flush  10-40 mL Intracatheter Q12H  . sodium chloride flush  3 mL Intravenous Q12H    have reviewed scheduled and prn medications.  Physical Exam: General: sitting up in bedside chair-  Slow moving but NAD Heart: slow Lungs: poor effort, clear anteriorly Abdomen: obese, distended Extremities: 1+ pitting edema     09/04/2020,7:26 AM  LOS: 10 days
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
## 2020-09-04 NOTE — Progress Notes (Signed)
EVENING ROUNDS NOTE :     [address]       [place] [address]             [phone]                 6 Days Post-Op Procedure(s) (LRB): CORONARY ARTERY BYPASS GRAFTING (CABG) X 5 USING LEFT INTERNAL MAMMARY ARTERY AND ENDOSCOPICALLY HARVESTED RIGHT GREATER SAPHENOUS VEIN. LIMA TO LAD, SVG TO OM SEQ TO CIRC, SVG TO PD, SVG TO DIAG. (N/A) CLIPPING OF ATRIAL APPENDAGE USING ATRICURE 50 MM ATRICLIP FLEX-V (N/A) TRANSESOPHAGEAL ECHOCARDIOGRAM (TEE) (N/A) ENDOVEIN HARVEST OF GREATER SAPHENOUS VEIN (Right)   Total Length of Stay:  LOS: 10 days  Events:   No events Resting comfortably    BP 137/62   Pulse 61   Temp 98.8 F (37.1 C) (Oral)   Resp 16   Ht 5\' 8"  (1.727 m)   Wt 107.5 kg   SpO2 96%   BMI 36.03 kg/m         . sodium chloride Stopped (08/30/20 1039)  . sodium chloride    . sodium chloride Stopped (09/04/20 1019)  . insulin Stopped (08/30/20 1348)  . lactated ringers 10 mL/hr at 08/30/20 0600  . lactated ringers    . nitroGLYCERIN Stopped (08/29/20 1500)    I/O last 3 completed shifts: In: 1452.4 [P.O.:957; I.V.:495.4] Out: 4385 [Urine:4385]   CBC Latest Ref Rng & Units 09/04/2020 09/03/2020 09/02/2020  WBC 4.0 - 10.5 K/uL 6.9 8.3 10.5  Hemoglobin 13.0 - 17.0 g/dL 9.3(L) 9.3(L) 8.9(L)  Hematocrit 39 - 52 % 29.1(L) 28.9(L) 28.3(L)  Platelets 150 - 400 K/uL 259 207 163    BMP Latest Ref Rng & Units 09/04/2020 09/03/2020 09/02/2020  Glucose 70 - 99 mg/dL 130(H) 90 161(H)  BUN 8 - 23 mg/dL 42(H) 46(H) 44(H)  Creatinine 0.61 - 1.24 mg/dL 3.03(H) 3.78(H) 4.03(H)  Sodium 135 - 145 mmol/L 139 136 136  Potassium 3.5 - 5.1 mmol/L 3.7 4.1 4.3  Chloride 98 - 111 mmol/L 105 104 104  CO2 22 - 32 mmol/L 25 21(L) 22  Calcium 8.9 - 10.3 mg/dL 8.0(L) 8.0(L) 7.8(L)    ABG    Component Value Date/Time   PHART 7.345 (L) 08/30/2020 0209   PCO2ART 36.1 08/30/2020 0209   PO2ART 80 (L) 08/30/2020 0209   HCO3 19.9 (L) 08/30/2020 0209   TCO2 21  (L) 08/30/2020 0209   ACIDBASEDEF 5.0 (H) 08/30/2020 0209   O2SAT 70.0 09/01/2020 0533       Melodie Bouillon, MD 09/04/2020 6:29 PM

## 2020-09-04 NOTE — Progress Notes (Signed)
Per Moshe Cipro MD, okay to give standard TCTS KCl replacement with elevated creatinine.

## 2020-09-04 NOTE — Plan of Care (Signed)
  Problem: Education: Goal: Ability to demonstrate management of disease process will improve Outcome: Progressing Goal: Ability to verbalize understanding of medication therapies will improve Outcome: Progressing Goal: Individualized Educational Video(s) Outcome: Progressing   Problem: Activity: Goal: Capacity to carry out activities will improve Outcome: Progressing   Problem: Cardiac: Goal: Ability to achieve and maintain adequate cardiopulmonary perfusion will improve Outcome: Progressing   Problem: Education: Goal: Knowledge of General Education information will improve Description: Including pain rating scale, medication(s)/side effects and non-pharmacologic comfort measures Outcome: Progressing   Problem: Health Behavior/Discharge Planning: Goal: Ability to manage health-related needs will improve Outcome: Progressing   Problem: Clinical Measurements: Goal: Ability to maintain clinical measurements within normal limits will improve Outcome: Progressing Goal: Will remain free from infection Outcome: Progressing Goal: Diagnostic test results will improve Outcome: Progressing Goal: Respiratory complications will improve Outcome: Progressing Goal: Cardiovascular complication will be avoided Outcome: Progressing   Problem: Activity: Goal: Risk for activity intolerance will decrease Outcome: Progressing   Problem: Nutrition: Goal: Adequate nutrition will be maintained Outcome: Progressing   Problem: Coping: Goal: Level of anxiety will decrease Outcome: Progressing   Problem: Elimination: Goal: Will not experience complications related to bowel motility Outcome: Progressing Goal: Will not experience complications related to urinary retention Outcome: Progressing   Problem: Pain Managment: Goal: General experience of comfort will improve Outcome: Progressing   Problem: Safety: Goal: Ability to remain free from injury will improve Outcome: Progressing    Problem: Skin Integrity: Goal: Risk for impaired skin integrity will decrease Outcome: Progressing   Problem: Education: Goal: Will demonstrate proper wound care and an understanding of methods to prevent future damage Outcome: Progressing Goal: Knowledge of disease or condition will improve Outcome: Progressing Goal: Knowledge of the prescribed therapeutic regimen will improve Outcome: Progressing Goal: Individualized Educational Video(s) Outcome: Progressing   Problem: Activity: Goal: Risk for activity intolerance will decrease Outcome: Progressing   Problem: Cardiac: Goal: Will achieve and/or maintain hemodynamic stability Outcome: Progressing   Problem: Clinical Measurements: Goal: Postoperative complications will be avoided or minimized Outcome: Progressing   Problem: Respiratory: Goal: Respiratory status will improve Outcome: Progressing   Problem: Skin Integrity: Goal: Wound healing without signs and symptoms of infection Outcome: Progressing Goal: Risk for impaired skin integrity will decrease Outcome: Progressing   Problem: Urinary Elimination: Goal: Ability to achieve and maintain adequate renal perfusion and functioning will improve Outcome: Progressing

## 2020-09-04 NOTE — Progress Notes (Signed)
Spoke with patient's wife [name] in regards to visitation policy at request of Dr. Servando Snare. She states that they will come today, and that they just wanted to give the patient some time to rest. Visitation policy clarified with her and all questions answered.

## 2020-09-04 NOTE — Progress Notes (Signed)
Patient ID: [name], male   DOB: 07/14/1935, 85 y.o.   MRN: [id number] TCTS DAILY ICU PROGRESS NOTE                   [address]            [place], [address]          [phone]   6 Days Post-Op Procedure(s) (LRB): CORONARY ARTERY BYPASS GRAFTING (CABG) X 5 USING LEFT INTERNAL MAMMARY ARTERY AND ENDOSCOPICALLY HARVESTED RIGHT GREATER SAPHENOUS VEIN. LIMA TO LAD, SVG TO OM SEQ TO CIRC, SVG TO PD, SVG TO DIAG. (N/A) CLIPPING OF ATRIAL APPENDAGE USING ATRICURE 30 MM ATRICLIP FLEX-V (N/A) TRANSESOPHAGEAL ECHOCARDIOGRAM (TEE) (N/A) ENDOVEIN HARVEST OF GREATER SAPHENOUS VEIN (Right)  Total Length of Stay:  LOS: 10 days   Subjective: Patient up to chair, on oxygen, has walked some in the hall still has some shortness of breath  Objective: Vital signs in last 24 hours: Temp:  [98.2 F (36.8 C)-98.5 F (36.9 C)] 98.3 F (36.8 C) (10/28 0400) Pulse Rate:  [78-81] 79 (10/28 0600) Cardiac Rhythm: A-V Sequential paced (10/27 2000) Resp:  [10-21] 18 (10/28 0600) BP: (100-147)/(64-100) 131/80 (10/28 0600) SpO2:  [95 %-100 %] 95 % (10/28 0600) Weight:  [107.5 kg] 107.5 kg (10/28 0500)  Filed Weights   09/02/20 0500 09/03/20 0500 09/04/20 0500  Weight: 109.8 kg 107.6 kg 107.5 kg    Weight change: -0.1 kg   Hemodynamic parameters for last 24 hours:    Intake/Output from previous day: 10/27 0701 - 10/28 0700 In: 1043.8 [P.O.:720; I.V.:323.8] Out: 3200 [Urine:3200]  Intake/Output this shift: No intake/output data recorded.  Current Meds: Scheduled Meds: . aspirin EC  325 mg Oral Daily   Or  . aspirin  324 mg Per Tube Daily  . bisacodyl  10 mg Oral Daily   Or  . bisacodyl  10 mg Rectal Daily  . budesonide (PULMICORT) nebulizer solution  1 mg Nebulization BID  . Chlorhexidine Gluconate Cloth  6 each Topical Daily  . darbepoetin (ARANESP) injection - NON-DIALYSIS  150 mcg Subcutaneous Q Mon-1800  . docusate sodium  200 mg Oral Daily  . enoxaparin  (LOVENOX) injection  30 mg Subcutaneous QHS  . feeding supplement  237 mL Oral TID BM  . insulin aspart  0-24 Units Subcutaneous TID AC & HS  . insulin detemir  20 Units Subcutaneous Daily  . midodrine  10 mg Oral TID WC  . multivitamin with minerals  1 tablet Oral Daily  . pantoprazole  40 mg Oral Daily  . potassium chloride  20 mEq Oral Q4H  . sodium chloride flush  10-40 mL Intracatheter Q12H  . sodium chloride flush  10-40 mL Intracatheter Q12H  . sodium chloride flush  3 mL Intravenous Q12H   Continuous Infusions: . sodium chloride Stopped (08/30/20 1039)  . sodium chloride    . sodium chloride 10 mL/hr at 09/04/20 0600  . DOPamine 2 mcg/kg/min (09/04/20 0600)  . insulin Stopped (08/30/20 1348)  . lactated ringers 10 mL/hr at 08/30/20 0600  . lactated ringers    . nitroGLYCERIN Stopped (08/29/20 1500)   PRN Meds:.sodium chloride, dextrose, ondansetron (ZOFRAN) IV, sodium chloride flush, sodium chloride flush, sodium chloride flush, traMADol  General appearance: alert, cooperative, appears stated age and no distress Neurologic: intact Heart: regular rate and rhythm, S1, S2 normal, no murmur, click, rub or gallop and Currently patient DDD paced, underlying rhythm in the high 50s and 60s Lungs:  diminished breath sounds bibasilar Abdomen: soft, non-tender; bowel sounds normal; no masses,  no organomegaly Extremities: extremities normal, atraumatic, no cyanosis or edema and Homans sign is negative, no sign of DVT Wound: Incisional wound VAC in place will remove today  Lab Results: CBC: Recent Labs    09/03/20 0412 09/04/20 0407  WBC 8.3 6.9  HGB 9.3* 9.3*  HCT 28.9* 29.1*  PLT 207 259   BMET:  Recent Labs    09/03/20 0412 09/04/20 0407  NA 136 139  K 4.1 3.7  CL 104 105  CO2 21* 25  GLUCOSE 90 130*  BUN 46* 42*  CREATININE 3.78* 3.03*  CALCIUM 8.0* 8.0*    CMET: Lab Results  Component Value Date   WBC 6.9 09/04/2020   HGB 9.3 (L) 09/04/2020   HCT 29.1 (L)  09/04/2020   PLT 259 09/04/2020   GLUCOSE 130 (H) 09/04/2020   CHOL 119 08/27/2020   TRIG 86 08/27/2020   HDL 40 (L) 08/27/2020   LDLCALC 62 08/27/2020   ALT 38 08/28/2020   AST 25 08/28/2020   NA 139 09/04/2020   K 3.7 09/04/2020   CL 105 09/04/2020   CREATININE 3.03 (H) 09/04/2020   BUN 42 (H) 09/04/2020   CO2 25 09/04/2020   INR 1.4 (H) 08/29/2020   HGBA1C 7.8 (H) 08/19/2020      PT/INR: No results for input(s): LABPROT, INR in the last 72 hours. Radiology: No results found.   Assessment/Plan: S/P Procedure(s) (LRB): CORONARY ARTERY BYPASS GRAFTING (CABG) X 5 USING LEFT INTERNAL MAMMARY ARTERY AND ENDOSCOPICALLY HARVESTED RIGHT GREATER SAPHENOUS VEIN. LIMA TO LAD, SVG TO OM SEQ TO CIRC, SVG TO PD, SVG TO DIAG. (N/A) CLIPPING OF ATRIAL APPENDAGE USING ATRICURE 45 MM ATRICLIP FLEX-V (N/A) TRANSESOPHAGEAL ECHOCARDIOGRAM (TEE) (N/A) ENDOVEIN HARVEST OF GREATER SAPHENOUS VEIN (Right) Mobilize 3200 mL urine output past 24 hours Creatinine continues to improve now down to 3.0 Will wean off dopamine today I have requested that the patient's daughter be able to come in and see him as she will likely be the primary caregiver if we are able to discharge him home We will need EP to evaluate prior to discharge-with out-of-hospital cardiac arrest, with MI, and also current underlying rhythm   Grace Isaac 09/04/2020 7:51 AM

## 2020-09-05 ENCOUNTER — Inpatient Hospital Stay (HOSPITAL_COMMUNITY): Payer: Medicare HMO

## 2020-09-05 LAB — CBC
HCT: 28.5 % — ABNORMAL LOW (ref 39.0–52.0)
Hemoglobin: 9 g/dL — ABNORMAL LOW (ref 13.0–17.0)
MCH: 28.9 pg (ref 26.0–34.0)
MCHC: 31.6 g/dL (ref 30.0–36.0)
MCV: 91.6 fL (ref 80.0–100.0)
Platelets: 303 10*3/uL (ref 150–400)
RBC: 3.11 MIL/uL — ABNORMAL LOW (ref 4.22–5.81)
RDW: 14.4 % (ref 11.5–15.5)
WBC: 7.2 10*3/uL (ref 4.0–10.5)
nRBC: 0 % (ref 0.0–0.2)

## 2020-09-05 LAB — RENAL FUNCTION PANEL
Albumin: 2.3 g/dL — ABNORMAL LOW (ref 3.5–5.0)
Anion gap: 8 (ref 5–15)
BUN: 43 mg/dL — ABNORMAL HIGH (ref 8–23)
CO2: 25 mmol/L (ref 22–32)
Calcium: 7.9 mg/dL — ABNORMAL LOW (ref 8.9–10.3)
Chloride: 105 mmol/L (ref 98–111)
Creatinine, Ser: 2.71 mg/dL — ABNORMAL HIGH (ref 0.61–1.24)
GFR, Estimated: 23 mL/min — ABNORMAL LOW (ref >60)
Glucose, Bld: 104 mg/dL — ABNORMAL HIGH (ref 70–99)
Phosphorus: 2.5 mg/dL (ref 2.5–4.6)
Potassium: 4.2 mmol/L (ref 3.5–5.1)
Sodium: 138 mmol/L (ref 135–145)

## 2020-09-05 LAB — GLUCOSE, CAPILLARY
Glucose-Capillary: 98 mg/dL (ref 70–99)
Glucose-Capillary: 129 mg/dL — ABNORMAL HIGH (ref 70–99)
Glucose-Capillary: 129 mg/dL — ABNORMAL HIGH (ref 70–99)

## 2020-09-05 MED ORDER — CLOPIDOGREL BISULFATE 75 MG PO TABS
75.0000 mg | ORAL_TABLET | Freq: Every day | ORAL | Status: DC
  Administered 2020-09-05 – 2020-09-10 (×6): 75 mg via ORAL
  Filled 2020-09-05 (×6): qty 1

## 2020-09-05 MED ORDER — OXYBUTYNIN CHLORIDE 5 MG PO TABS
5.0000 mg | ORAL_TABLET | Freq: Two times a day (BID) | ORAL | Status: DC
  Administered 2020-09-05 – 2020-09-10 (×11): 5 mg via ORAL
  Filled 2020-09-05 (×13): qty 1

## 2020-09-05 MED ORDER — ALUM & MAG HYDROXIDE-SIMETH 200-200-20 MG/5ML PO SUSP: 15.0000 mL | ORAL | Status: DC | PRN

## 2020-09-05 MED ORDER — TRAMADOL HCL 50 MG PO TABS: 50.0000 mg | ORAL_TABLET | Freq: Two times a day (BID) | ORAL | Status: DC | PRN

## 2020-09-05 MED ORDER — BISACODYL 5 MG PO TBEC: 10.0000 mg | DELAYED_RELEASE_TABLET | Freq: Every day | ORAL | Status: DC | PRN

## 2020-09-05 MED ORDER — ONDANSETRON HCL 4 MG PO TABS: 4.0000 mg | ORAL_TABLET | Freq: Four times a day (QID) | ORAL | Status: DC | PRN

## 2020-09-05 MED ORDER — ACETAMINOPHEN 325 MG PO TABS: 650.0000 mg | ORAL_TABLET | Freq: Four times a day (QID) | ORAL | Status: DC | PRN

## 2020-09-05 MED ORDER — BISACODYL 10 MG RE SUPP: 10.0000 mg | Freq: Every day | RECTAL | Status: DC | PRN

## 2020-09-05 MED ORDER — PANTOPRAZOLE SODIUM 40 MG PO TBEC
40.0000 mg | DELAYED_RELEASE_TABLET | Freq: Every day | ORAL | Status: DC
  Administered 2020-09-05 – 2020-09-10 (×6): 40 mg via ORAL
  Filled 2020-09-05 (×6): qty 1

## 2020-09-05 MED ORDER — SODIUM CHLORIDE 0.9% FLUSH: 3.0000 mL | INTRAVENOUS | Status: DC | PRN

## 2020-09-05 MED ORDER — ~~LOC~~ CARDIAC SURGERY, PATIENT & FAMILY EDUCATION: Freq: Once | Status: AC

## 2020-09-05 MED ORDER — TRAMADOL HCL 50 MG PO TABS: 50.0000 mg | ORAL_TABLET | Freq: Two times a day (BID) | ORAL | Status: DC

## 2020-09-05 MED ORDER — SODIUM CHLORIDE 0.9 % IV SOLN: 250.0000 mL | INTRAVENOUS | Status: DC | PRN

## 2020-09-05 MED ORDER — ASPIRIN EC 81 MG PO TBEC
81.0000 mg | DELAYED_RELEASE_TABLET | Freq: Every day | ORAL | Status: DC
  Administered 2020-09-05 – 2020-09-10 (×6): 81 mg via ORAL
  Filled 2020-09-05 (×6): qty 1

## 2020-09-05 MED ORDER — ONDANSETRON HCL 4 MG/2ML IJ SOLN: 4.0000 mg | Freq: Four times a day (QID) | INTRAMUSCULAR | Status: DC | PRN

## 2020-09-05 MED ORDER — INSULIN DETEMIR 100 UNIT/ML ~~LOC~~ SOLN
15.0000 [IU] | Freq: Every day | SUBCUTANEOUS | Status: DC
  Administered 2020-09-05 – 2020-09-10 (×6): 15 [IU] via SUBCUTANEOUS
  Filled 2020-09-05 (×6): qty 0.15

## 2020-09-05 MED ORDER — CLOPIDOGREL BISULFATE 75 MG PO TABS: 75.0000 mg | ORAL_TABLET | Freq: Every day | ORAL | Status: DC

## 2020-09-05 MED ORDER — SODIUM CHLORIDE 0.9% FLUSH
3.0000 mL | Freq: Two times a day (BID) | INTRAVENOUS | Status: DC
  Administered 2020-09-05 – 2020-09-10 (×11): 3 mL via INTRAVENOUS

## 2020-09-05 MED ORDER — INSULIN ASPART 100 UNIT/ML ~~LOC~~ SOLN
0.0000 [IU] | Freq: Three times a day (TID) | SUBCUTANEOUS | Status: DC
  Administered 2020-09-05 (×3): 2 [IU] via SUBCUTANEOUS
  Administered 2020-09-06: 4 [IU] via SUBCUTANEOUS
  Administered 2020-09-06 – 2020-09-07 (×2): 2 [IU] via SUBCUTANEOUS
  Administered 2020-09-07: 4 [IU] via SUBCUTANEOUS
  Administered 2020-09-07 – 2020-09-08 (×4): 2 [IU] via SUBCUTANEOUS
  Administered 2020-09-08: 4 [IU] via SUBCUTANEOUS
  Administered 2020-09-08: 2 [IU] via SUBCUTANEOUS
  Administered 2020-09-09: 4 [IU] via SUBCUTANEOUS
  Administered 2020-09-09 (×2): 2 [IU] via SUBCUTANEOUS

## 2020-09-05 NOTE — Progress Notes (Deleted)
Dr.Lightfoot aware of K 3.7, Cr 3.03 trending down. No orders at this time.

## 2020-09-05 NOTE — Progress Notes (Signed)
Pt admitted to [facility] from [facility].  Pt A&O x 4 and neuro intact.  CHG bath completed.  Pt placed on telemetry and CCMD notified.  Vitals taken and all within normal range with the exception of BP 141/70.
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
## 2020-09-05 NOTE — Progress Notes (Signed)
CARDIAC REHAB PHASE I   Went to offer to walk with pt. Pt seen ambulating with PT. Will continue to follow.  Rufina Falco, RN BSN 09/05/2020 2:04 PM

## 2020-09-05 NOTE — Progress Notes (Signed)
Subjective:  UOP appears down but still adequate-  Lasix was stopped yesterday- he looks a little brighter-  No nausea but still a little short winded, but better daily-- crt down some more  Objective Vital signs in last 24 hours: Vitals:   09/05/20 0727 09/05/20 0755 09/05/20 0759 09/05/20 0800  BP:    (!) 124/57  Pulse:  62  (!) 58  Resp:  16  15  Temp: 98.1 F (36.7 C)   98.3 F (36.8 C)  TempSrc:    Oral  SpO2:   98% 96%  Weight:      Height:       Weight change: -1.4 kg  Intake/Output Summary (Last 24 hours) at 09/05/2020 4287 Last data filed at 09/05/2020 0800 Gross per 24 hour  Intake 759.2 ml  Output 700 ml  Net 59.2 ml    Assessment/Plan: 85 year old black male with diabetes and hypertension.  He experienced an out of hospital V. fib arrest-now status post cardiac catheterization as well as 5 vessel CABG 3 days ago.  This has been complicated by acute on chronic renal failure 1.Renal-acute on chronic renal failure.  Not surprising given scenario.  He did have some pre-existing CKD (crt 1.5 to 2) likely secondary to age-related nephrosclerosis versus diabetes and hypertension.  Therefore, he would be at increased risk for kidney injury in this setting.  Creatinine had been rising-   Stable Wed, then trending down.  He was oliguric but UOP has increased with minimal lasix (40 IV BID).  Fortunately, there have been no indications for renal replacement therapy and he now seems to be improving.    Regarding IV access-  Would be appropriate for a midline PICC  2. Hypertension/volume  -blood pressure is reasonable on dopamine- has been stopped.  He is on midodrine as well.  Given that he was a little more short of breath and had a little edema I started him with fairly low dose of IV Lasix - was 3 liters negative so held scheduled lasix and just do PRN-  No need today 3. CV-6 days postop from CABG.  Per CTS and nursing 4. Anemia  -is situational but significant.  Could be  contributing to his shortness of breath.   iron stores low, gave iron and have added ESA- is stable to overall improved    [name]    Labs: Basic Metabolic Panel: Recent Labs  Lab 09/03/20 0412 09/04/20 0407 09/05/20 0425  NA 136 139 138  K 4.1 3.7 4.2  CL 104 105 105  CO2 21* 25 25  GLUCOSE 90 130* 104*  BUN 46* 42* 43*  CREATININE 3.78* 3.03* 2.71*  CALCIUM 8.0* 8.0* 7.9*  PHOS  --   --  2.5   Liver Function Tests: Recent Labs  Lab 09/05/20 0425  ALBUMIN 2.3*   No results for input(s): LIPASE, AMYLASE in the last 168 hours. No results for input(s): AMMONIA in the last 168 hours. CBC: Recent Labs  Lab 09/01/20 0533 09/01/20 0533 09/02/20 0400 09/02/20 0400 09/03/20 0412 09/04/20 0407 09/05/20 0425  WBC 12.3*   < > 10.5   < > 8.3 6.9 7.2  HGB 8.7*   < > 8.9*   < > 9.3* 9.3* 9.0*  HCT 27.5*   < > 28.3*   < > 28.9* 29.1* 28.5*  MCV 93.9  --  92.2  --  90.6 92.7 91.6  PLT 118*   < > 163   < > 207 259  303   < > = values in this interval not displayed.   Cardiac Enzymes: No results for input(s): CKTOTAL, CKMB, CKMBINDEX, TROPONINI in the last 168 hours. CBG: Recent Labs  Lab 09/04/20 1644 09/04/20 1708 09/04/20 1727 09/04/20 2103 09/05/20 0629  GLUCAP 62* 69* 129* 138* 98    Iron Studies:  No results for input(s): IRON, TIBC, TRANSFERRIN, FERRITIN in the last 72 hours. Studies/Results: DG Chest Port 1 View  Result Date: 09/05/2020 CLINICAL DATA:  Chest surgery. EXAM: PORTABLE CHEST 1 VIEW COMPARISON:  09/03/2020.  CT 08/18/2020. FINDINGS: Prior CABG. Stable cardiomegaly. No pulmonary venous congestion. Persistent left base atelectasis/infiltrate and small left pleural effusion. Mild right base subsegmental atelectasis/infiltrate small right pleural effusion noted on today's exam. Right rib fractures best identified by prior CT. Ossified lesion adjacent to the scapula again noted in best identified by prior CT. IMPRESSION: 1. Prior CABG.  Stable cardiomegaly. No pulmonary venous congestion. 2. Persistent left base atelectasis/infiltrate and small left pleural effusion. 3. Mild right base subsegmental atelectasis/infiltrate small right pleural effusion noted on today's exam. Electronically Signed   By: Marcello Moores  Register   On: 09/05/2020 06:56   Medications: Infusions: . sodium chloride      Scheduled Medications: . aspirin EC  81 mg Oral Daily  . budesonide (PULMICORT) nebulizer solution  1 mg Nebulization BID  . Chlorhexidine Gluconate Cloth  6 each Topical Daily  . clopidogrel  75 mg Oral Daily  . [place] [facility], Patient & Family Education   Does not apply Once  . darbepoetin (ARANESP) injection - NON-DIALYSIS  150 mcg Subcutaneous Q Mon-1800  . docusate sodium  200 mg Oral Daily  . enoxaparin (LOVENOX) injection  30 mg Subcutaneous QHS  . feeding supplement  237 mL Oral TID BM  . insulin aspart  0-24 Units Subcutaneous TID AC & HS  . insulin detemir  15 Units Subcutaneous Daily  . multivitamin with minerals  1 tablet Oral Daily  . oxybutynin  5 mg Oral BID  . pantoprazole  40 mg Oral QAC breakfast  . rosuvastatin  40 mg Oral Daily  . sodium chloride flush  10-40 mL Intracatheter Q12H  . sodium chloride flush  3 mL Intravenous Q12H    have reviewed scheduled and prn medications.  Physical Exam: General: sitting up in bedside chair-   NAD- more animated Heart: slow Lungs: poor effort, clear anteriorly Abdomen: obese, distended Extremities: still 1+ pitting edema     09/05/2020,8:29 AM  LOS: 11 days

## 2020-09-05 NOTE — Progress Notes (Signed)
Patient ID: [name], male   DOB: 06-Nov-1935, 85 y.o.   MRN: [id number] TCTS DAILY ICU PROGRESS NOTE                   [address]            [place] [address]          [phone]   7 Days Post-Op Procedure(s) (LRB): CORONARY ARTERY BYPASS GRAFTING (CABG) X 5 USING LEFT INTERNAL MAMMARY ARTERY AND ENDOSCOPICALLY HARVESTED RIGHT GREATER SAPHENOUS VEIN. LIMA TO LAD, SVG TO OM SEQ TO CIRC, SVG TO PD, SVG TO DIAG. (N/A) CLIPPING OF ATRIAL APPENDAGE USING ATRICURE 84 MM ATRICLIP FLEX-V (N/A) TRANSESOPHAGEAL ECHOCARDIOGRAM (TEE) (N/A) ENDOVEIN HARVEST OF GREATER SAPHENOUS VEIN (Right)  Total Length of Stay:  LOS: 11 days   Subjective: Patient feels better today, has not been eating much but says his appetite is improving  Objective: Vital signs in last 24 hours: Temp:  [98.1 F (36.7 C)-99.3 F (37.4 C)] 98.1 F (36.7 C) (10/29 0727) Pulse Rate:  [54-83] 67 (10/29 0500) Cardiac Rhythm: Normal sinus rhythm (10/29 0400) Resp:  [13-22] 20 (10/29 0600) BP: (113-155)/(55-107) 151/69 (10/29 0600) SpO2:  [95 %-100 %] 98 % (10/29 0500) Weight:  [106.1 kg] 106.1 kg (10/29 0500)  Filed Weights   09/03/20 0500 09/04/20 0500 09/05/20 0500  Weight: 107.6 kg 107.5 kg 106.1 kg    Weight change: -1.4 kg   Hemodynamic parameters for last 24 hours:    Intake/Output from previous day: 10/28 0701 - 10/29 0700 In: 427.3 [P.O.:360; I.V.:67.3] Out: 700 [Urine:700]  Intake/Output this shift: No intake/output data recorded.  Current Meds: Scheduled Meds: . aspirin EC  325 mg Oral Daily   Or  . aspirin  324 mg Per Tube Daily  . bisacodyl  10 mg Oral Daily   Or  . bisacodyl  10 mg Rectal Daily  . budesonide (PULMICORT) nebulizer solution  1 mg Nebulization BID  . Chlorhexidine Gluconate Cloth  6 each Topical Daily  . darbepoetin (ARANESP) injection - NON-DIALYSIS  150 mcg Subcutaneous Q Mon-1800  . docusate sodium  200 mg Oral Daily  . enoxaparin (LOVENOX)  injection  30 mg Subcutaneous QHS  . feeding supplement  237 mL Oral TID BM  . insulin aspart  0-24 Units Subcutaneous TID AC & HS  . insulin detemir  20 Units Subcutaneous Daily  . midodrine  10 mg Oral TID WC  . multivitamin with minerals  1 tablet Oral Daily  . pantoprazole  40 mg Oral Daily  . rosuvastatin  40 mg Oral Daily  . sodium chloride flush  10-40 mL Intracatheter Q12H  . sodium chloride flush  10-40 mL Intracatheter Q12H  . sodium chloride flush  3 mL Intravenous Q12H   Continuous Infusions: . sodium chloride Stopped (08/30/20 1039)  . sodium chloride    . sodium chloride Stopped (09/04/20 1019)  . insulin Stopped (08/30/20 1348)  . lactated ringers 10 mL/hr at 08/30/20 0600  . lactated ringers    . nitroGLYCERIN Stopped (08/29/20 1500)   PRN Meds:.sodium chloride, dextrose, ondansetron (ZOFRAN) IV, sodium chloride flush, sodium chloride flush, sodium chloride flush, traMADol  General appearance: alert and cooperative Neurologic: intact Heart: regular rate and rhythm, S1, S2 normal, no murmur, click, rub or gallop Lungs: diminished breath sounds bibasilar Abdomen: soft, non-tender; bowel sounds normal; no masses,  no organomegaly Extremities: extremities normal, atraumatic, no cyanosis or edema and Homans sign is negative, no sign of  DVT Wound: Incisional wound VAC in place to be removed this morning  Lab Results: CBC: Recent Labs    09/04/20 0407 09/05/20 0425  WBC 6.9 7.2  HGB 9.3* 9.0*  HCT 29.1* 28.5*  PLT 259 303   BMET:  Recent Labs    09/04/20 0407 09/05/20 0425  NA 139 138  K 3.7 4.2  CL 105 105  CO2 25 25  GLUCOSE 130* 104*  BUN 42* 43*  CREATININE 3.03* 2.71*  CALCIUM 8.0* 7.9*    CMET: Lab Results  Component Value Date   WBC 7.2 09/05/2020   HGB 9.0 (L) 09/05/2020   HCT 28.5 (L) 09/05/2020   PLT 303 09/05/2020   GLUCOSE 104 (H) 09/05/2020   CHOL 119 08/27/2020   TRIG 86 08/27/2020   HDL 40 (L) 08/27/2020   LDLCALC 62  08/27/2020   ALT 38 08/28/2020   AST 25 08/28/2020   NA 138 09/05/2020   K 4.2 09/05/2020   CL 105 09/05/2020   CREATININE 2.71 (H) 09/05/2020   BUN 43 (H) 09/05/2020   CO2 25 09/05/2020   INR 1.4 (H) 08/29/2020   HGBA1C 7.8 (H) 08/19/2020      PT/INR: No results for input(s): LABPROT, INR in the last 72 hours. Radiology: [facility] Chest Port 1 View  Result Date: 09/05/2020 CLINICAL DATA:  Chest surgery. EXAM: PORTABLE CHEST 1 VIEW COMPARISON:  09/03/2020.  CT 08/18/2020. FINDINGS: Prior CABG. Stable cardiomegaly. No pulmonary venous congestion. Persistent left base atelectasis/infiltrate and small left pleural effusion. Mild right base subsegmental atelectasis/infiltrate small right pleural effusion noted on today's exam. Right rib fractures best identified by prior CT. Ossified lesion adjacent to the scapula again noted in best identified by prior CT. IMPRESSION: 1. Prior CABG. Stable cardiomegaly. No pulmonary venous congestion. 2. Persistent left base atelectasis/infiltrate and small left pleural effusion. 3. Mild right base subsegmental atelectasis/infiltrate small right pleural effusion noted on today's exam. Electronically Signed   By: Marcello Moores  Register   On: 09/05/2020 06:56     Assessment/Plan: S/P Procedure(s) (LRB): CORONARY ARTERY BYPASS GRAFTING (CABG) X 5 USING LEFT INTERNAL MAMMARY ARTERY AND ENDOSCOPICALLY HARVESTED RIGHT GREATER SAPHENOUS VEIN. LIMA TO LAD, SVG TO OM SEQ TO CIRC, SVG TO PD, SVG TO DIAG. (N/A) CLIPPING OF ATRIAL APPENDAGE USING ATRICURE 39 MM ATRICLIP FLEX-V (N/A) TRANSESOPHAGEAL ECHOCARDIOGRAM (TEE) (N/A) ENDOVEIN HARVEST OF GREATER SAPHENOUS VEIN (Right) Mobilize Diabetes control Plan for transfer to step-down: see transfer orders Patient being followed by EP service concerning his underlying rhythm-currently DDD backup paced-rate is in the low 60s Optimize home meds per cardiology/EP Renal function continues to improve but not back to baseline  yet    Grace Isaac 09/05/2020 7:44 AM

## 2020-09-06 ENCOUNTER — Inpatient Hospital Stay (HOSPITAL_COMMUNITY): Payer: Medicare HMO

## 2020-09-06 DIAGNOSIS — I4901 Ventricular fibrillation: Secondary | ICD-10-CM

## 2020-09-06 LAB — CBC
HCT: 27.9 % — ABNORMAL LOW (ref 39.0–52.0)
Hemoglobin: 8.7 g/dL — ABNORMAL LOW (ref 13.0–17.0)
MCH: 28.8 pg (ref 26.0–34.0)
MCHC: 31.2 g/dL (ref 30.0–36.0)
MCV: 92.4 fL (ref 80.0–100.0)
Platelets: 333 10*3/uL (ref 150–400)
RBC: 3.02 MIL/uL — ABNORMAL LOW (ref 4.22–5.81)
RDW: 14.4 % (ref 11.5–15.5)
WBC: 7.2 10*3/uL (ref 4.0–10.5)
nRBC: 0 % (ref 0.0–0.2)

## 2020-09-06 LAB — ECHOCARDIOGRAM COMPLETE
Area-P 1/2: 2.91 cm2
Height: 68 in
S' Lateral: 3.3 cm
Weight: 3770.75 oz

## 2020-09-06 LAB — GLUCOSE, CAPILLARY
Glucose-Capillary: 127 mg/dL — ABNORMAL HIGH (ref 70–99)
Glucose-Capillary: 141 mg/dL — ABNORMAL HIGH (ref 70–99)
Glucose-Capillary: 112 mg/dL — ABNORMAL HIGH (ref 70–99)
Glucose-Capillary: 120 mg/dL — ABNORMAL HIGH (ref 70–99)

## 2020-09-06 LAB — BASIC METABOLIC PANEL
Anion gap: 10 (ref 5–15)
BUN: 44 mg/dL — ABNORMAL HIGH (ref 8–23)
CO2: 25 mmol/L (ref 22–32)
Calcium: 8.1 mg/dL — ABNORMAL LOW (ref 8.9–10.3)
Chloride: 105 mmol/L (ref 98–111)
Creatinine, Ser: 2.37 mg/dL — ABNORMAL HIGH (ref 0.61–1.24)
GFR, Estimated: 27 mL/min — ABNORMAL LOW (ref >60)
Glucose, Bld: 120 mg/dL — ABNORMAL HIGH (ref 70–99)
Potassium: 4.3 mmol/L (ref 3.5–5.1)
Sodium: 140 mmol/L (ref 135–145)

## 2020-09-06 MED ORDER — PERFLUTREN LIPID MICROSPHERE
1.0000 mL | INTRAVENOUS | Status: AC | PRN
  Administered 2020-09-06: 2 mL via INTRAVENOUS
  Filled 2020-09-06: qty 10

## 2020-09-06 NOTE — Progress Notes (Signed)
*  PRELIMINARY RESULTS* Echocardiogram 2D Echocardiogram has been performed with Definity.  Samuel Germany 09/06/2020, 1:49 PM

## 2020-09-06 NOTE — Progress Notes (Addendum)
[address]       [place] [address]             [phone]      8 Days Post-Op Procedure(s) (LRB): CORONARY ARTERY BYPASS GRAFTING (CABG) X 5 USING LEFT INTERNAL MAMMARY ARTERY AND ENDOSCOPICALLY HARVESTED RIGHT GREATER SAPHENOUS VEIN. LIMA TO LAD, SVG TO OM SEQ TO CIRC, SVG TO PD, SVG TO DIAG. (N/A) CLIPPING OF ATRIAL APPENDAGE USING ATRICURE 45 MM ATRICLIP FLEX-V (N/A) TRANSESOPHAGEAL ECHOCARDIOGRAM (TEE) (N/A) ENDOVEIN HARVEST OF GREATER SAPHENOUS VEIN (Right) Subjective: Slowly getting stronger  Objective: Vital signs in last 24 hours: Temp:  [98.4 F (36.9 C)-98.8 F (37.1 C)] 98.6 F (37 C) (10/30 0409) Pulse Rate:  [54-91] 88 (10/30 0409) Cardiac Rhythm: Heart block (10/30 0700) Resp:  [14-20] 17 (10/30 0409) BP: (112-141)/(53-85) 134/67 (10/30 0409) SpO2:  [96 %-100 %] 99 % (10/30 0409) Weight:  [106.9 kg] 106.9 kg (10/30 0409)  Hemodynamic parameters for last 24 hours:    Intake/Output from previous day: 10/29 0701 - 10/30 0700 In: 820 [P.O.:820] Out: 225 [Urine:225] Intake/Output this shift: No intake/output data recorded.  General appearance: alert, cooperative, fatigued and no distress Heart: irregularly irregular rhythm Lungs: dim in bases Abdomen: soft, nontender Extremities: no edema Wound: incis healing well  Lab Results: Recent Labs    09/05/20 0425 09/06/20 0433  WBC 7.2 7.2  HGB 9.0* 8.7*  HCT 28.5* 27.9*  PLT 303 333   BMET:  Recent Labs    09/05/20 0425 09/06/20 0433  NA 138 140  K 4.2 4.3  CL 105 105  CO2 25 25  GLUCOSE 104* 120*  BUN 43* 44*  CREATININE 2.71* 2.37*  CALCIUM 7.9* 8.1*    PT/INR: No results for input(s): LABPROT, INR in the last 72 hours. ABG    Component Value Date/Time   PHART 7.345 (L) 08/30/2020 0209   HCO3 19.9 (L) 08/30/2020 0209   TCO2 21 (L) 08/30/2020 0209   ACIDBASEDEF 5.0 (H) 08/30/2020 0209   O2SAT 70.0 09/01/2020 0533   CBG (last 3)  Recent Labs     09/05/20 2221 09/05/20 2237 09/06/20 0551  GLUCAP 127* 141* 112*    Meds Scheduled Meds: . aspirin EC  81 mg Oral Daily  . budesonide (PULMICORT) nebulizer solution  1 mg Nebulization BID  . Chlorhexidine Gluconate Cloth  6 each Topical Daily  . clopidogrel  75 mg Oral Daily  . darbepoetin (ARANESP) injection - NON-DIALYSIS  150 mcg Subcutaneous Q Mon-1800  . docusate sodium  200 mg Oral Daily  . enoxaparin (LOVENOX) injection  30 mg Subcutaneous QHS  . feeding supplement  237 mL Oral TID BM  . insulin aspart  0-24 Units Subcutaneous TID AC & HS  . insulin detemir  15 Units Subcutaneous Daily  . multivitamin with minerals  1 tablet Oral Daily  . oxybutynin  5 mg Oral BID  . pantoprazole  40 mg Oral QAC breakfast  . rosuvastatin  40 mg Oral Daily  . sodium chloride flush  10-40 mL Intracatheter Q12H  . sodium chloride flush  3 mL Intravenous Q12H   Continuous Infusions: . sodium chloride     PRN Meds:.sodium chloride, acetaminophen, alum & mag hydroxide-simeth, bisacodyl **OR** bisacodyl, ondansetron **OR** ondansetron (ZOFRAN) IV, sodium chloride flush, traMADol  Xrays DG Chest Port 1 View  Result Date: 09/05/2020 CLINICAL DATA:  Chest surgery. EXAM: PORTABLE CHEST 1 VIEW COMPARISON:  09/03/2020.  CT 08/18/2020. FINDINGS: Prior CABG. Stable cardiomegaly.  No pulmonary venous congestion. Persistent left base atelectasis/infiltrate and small left pleural effusion. Mild right base subsegmental atelectasis/infiltrate small right pleural effusion noted on today's exam. Right rib fractures best identified by prior CT. Ossified lesion adjacent to the scapula again noted in best identified by prior CT. IMPRESSION: 1. Prior CABG. Stable cardiomegaly. No pulmonary venous congestion. 2. Persistent left base atelectasis/infiltrate and small left pleural effusion. 3. Mild right base subsegmental atelectasis/infiltrate small right pleural effusion noted on today's exam. Electronically Signed    By: Marcello Moores  Register   On: 09/05/2020 06:56    Assessment/Plan: S/P Procedure(s) (LRB): CORONARY ARTERY BYPASS GRAFTING (CABG) X 5 USING LEFT INTERNAL MAMMARY ARTERY AND ENDOSCOPICALLY HARVESTED RIGHT GREATER SAPHENOUS VEIN. LIMA TO LAD, SVG TO OM SEQ TO CIRC, SVG TO PD, SVG TO DIAG. (N/A) CLIPPING OF ATRIAL APPENDAGE USING ATRICURE 45 MM ATRICLIP FLEX-V (N/A) TRANSESOPHAGEAL ECHOCARDIOGRAM (TEE) (N/A) ENDOVEIN HARVEST OF GREATER SAPHENOUS VEIN (Right)  1 afeb, VSS, + PVC's, PAC's 2 sats good on 2 liters 3 will order TTE per EP recs 4 creat conts to improve, 2.37 today- nephrology managing diuretics 5 blood counts are stable 6 BS are under   good control 7 cont pulm toilet and rehab modalities 8 hopefully home by early next week  LOS: 12 days    John Giovanni PA-C Pager [phone] 09/06/2020  Patient ambulating in hallway Today's chest x-ray looks fine Renal function continues to improve Heart rate in the 60s with the backup at 30-can probably roll and tape wires tomorrow Echocardiogram is pending prior to discharge   patient examined and medical record reviewed,agree with above note. Tharon Aquas Trigt III 09/06/2020

## 2020-09-06 NOTE — Progress Notes (Signed)
Mobility Specialist: Progress Note   09/06/20 1135  Mobility  Activity Ambulated in hall  Level of Assistance Contact guard assist, steadying assist  Assistive Device Front wheel walker  Distance Ambulated (ft) 320 ft  Mobility Response Tolerated fair  Mobility performed by Mobility specialist  Bed Position High-fowlers  $Mobility charge 1 Mobility   Pre-Mobility: 68 HR, 123/55 BP, 99% SpO2 During Mobility: 115 HR, 96% SpO2 Post-Mobility: 84 HR, 157/77 BP, 99% SpO2  Pt ambulated on 2 L/min [place]. Pt stopped to take two rest breaks, one standing and one sitting. Pt stood for only a few seconds before continuing ambulation. Pt took a seated rest break halfway through ambulation lasting 1-2 minutes. Both rest breaks were due to pt feeling SOB. Pt positioned back in bed with call bell within reach.   [facility] Evadna Donaghy Mobility Specialist
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
## 2020-09-06 NOTE — Progress Notes (Signed)
Subjective:  UOP appears down but may not be fully recorded, moved to 4E-- he looks a little brighter, breathing is better--- crt down some more  Objective Vital signs in last 24 hours: Vitals:   09/06/20 0044 09/06/20 0409 09/06/20 0900 09/06/20 0929  BP: 126/74 134/67 (!) 131/55   Pulse: 85 88 64   Resp: 17 17 18    Temp: 98.7 F (37.1 C) 98.6 F (37 C) 98.4 F (36.9 C)   TempSrc: Oral Oral Oral   SpO2: 100% 99% 98% 98%  Weight:  106.9 kg    Height:       Weight change: 0.8 kg  Intake/Output Summary (Last 24 hours) at 09/06/2020 8250 Last data filed at 09/06/2020 5397 Gross per 24 hour  Intake 460 ml  Output 325 ml  Net 135 ml    Assessment/Plan: 85 year old black male with diabetes and hypertension.  He experienced an out of hospital V. fib arrest-now status post cardiac catheterization as well as 5 vessel CABG 3 days ago.  This has been complicated by acute on chronic renal failure 1.Renal-acute on chronic renal failure.  Not surprising given scenario.  He did have some pre-existing CKD (crt 1.5 to 2) likely secondary to age-related nephrosclerosis versus diabetes and hypertension.  Therefore, he would be at increased risk for kidney injury in this setting.  Creatinine had been rising-   Stable Wed, then trending down since.  He was oliguric but UOP had increased with minimal lasix (40 IV BID).  Fortunately, there have been no indications for renal replacement therapy and he now seems to be improving.    Regarding IV access-  Would be appropriate for a midline PICC  2. Hypertension/volume  -blood pressure is reasonable on dopamine- has been stopped.  He is on midodrine as well.  Given that he was a little more short of breath and had a little edema I started him with fairly low dose of IV Lasix - was 3 liters negative so held scheduled lasix and just do PRN-  No real need today but stays available to use per CTS as they see fit 3. CV-8 days postop from CABG.  Per CTS and  nursing 4. Anemia  -is situational but significant.  Could be contributing to his shortness of breath.   iron stores low, gave iron and have added ESA- is stable to overall improved  Renal will not see daily but will follow at a distance to look at labs- he is approaching his renal function baseline.  Again, feel free to use lasix prn if you feel the need, he did respond well to it,  but I think he will equilibrate his own volume in time.  Cal with questions    [name]    Labs: Basic Metabolic Panel: Recent Labs  Lab 09/04/20 0407 09/05/20 0425 09/06/20 0433  NA 139 138 140  K 3.7 4.2 4.3  CL 105 105 105  CO2 25 25 25   GLUCOSE 130* 104* 120*  BUN 42* 43* 44*  CREATININE 3.03* 2.71* 2.37*  CALCIUM 8.0* 7.9* 8.1*  PHOS  --  2.5  --    Liver Function Tests: Recent Labs  Lab 09/05/20 0425  ALBUMIN 2.3*   No results for input(s): LIPASE, AMYLASE in the last 168 hours. No results for input(s): AMMONIA in the last 168 hours. CBC: Recent Labs  Lab 09/02/20 0400 09/02/20 0400 09/03/20 0412 09/03/20 0412 09/04/20 0407 09/05/20 0425 09/06/20 0433  WBC 10.5   < >  8.3   < > 6.9 7.2 7.2  HGB 8.9*   < > 9.3*   < > 9.3* 9.0* 8.7*  HCT 28.3*   < > 28.9*   < > 29.1* 28.5* 27.9*  MCV 92.2  --  90.6  --  92.7 91.6 92.4  PLT 163   < > 207   < > 259 303 333   < > = values in this interval not displayed.   Cardiac Enzymes: No results for input(s): CKTOTAL, CKMB, CKMBINDEX, TROPONINI in the last 168 hours. CBG: Recent Labs  Lab 09/05/20 1111 09/05/20 1518 09/05/20 2221 09/05/20 2237 09/06/20 0551  GLUCAP 129* 129* 127* 141* 112*    Iron Studies:  No results for input(s): IRON, TIBC, TRANSFERRIN, FERRITIN in the last 72 hours. Studies/Results: DG Chest Port 1 View  Result Date: 09/05/2020 CLINICAL DATA:  Chest surgery. EXAM: PORTABLE CHEST 1 VIEW COMPARISON:  09/03/2020.  CT 08/18/2020. FINDINGS: Prior CABG. Stable cardiomegaly. No pulmonary venous  congestion. Persistent left base atelectasis/infiltrate and small left pleural effusion. Mild right base subsegmental atelectasis/infiltrate small right pleural effusion noted on today's exam. Right rib fractures best identified by prior CT. Ossified lesion adjacent to the scapula again noted in best identified by prior CT. IMPRESSION: 1. Prior CABG. Stable cardiomegaly. No pulmonary venous congestion. 2. Persistent left base atelectasis/infiltrate and small left pleural effusion. 3. Mild right base subsegmental atelectasis/infiltrate small right pleural effusion noted on today's exam. Electronically Signed   By: Marcello Moores  Register   On: 09/05/2020 06:56   Medications: Infusions: . sodium chloride      Scheduled Medications: . aspirin EC  81 mg Oral Daily  . budesonide (PULMICORT) nebulizer solution  1 mg Nebulization BID  . Chlorhexidine Gluconate Cloth  6 each Topical Daily  . clopidogrel  75 mg Oral Daily  . darbepoetin (ARANESP) injection - NON-DIALYSIS  150 mcg Subcutaneous Q Mon-1800  . docusate sodium  200 mg Oral Daily  . enoxaparin (LOVENOX) injection  30 mg Subcutaneous QHS  . feeding supplement  237 mL Oral TID BM  . insulin aspart  0-24 Units Subcutaneous TID AC & HS  . insulin detemir  15 Units Subcutaneous Daily  . multivitamin with minerals  1 tablet Oral Daily  . oxybutynin  5 mg Oral BID  . pantoprazole  40 mg Oral QAC breakfast  . rosuvastatin  40 mg Oral Daily  . sodium chloride flush  10-40 mL Intracatheter Q12H  . sodium chloride flush  3 mL Intravenous Q12H    have reviewed scheduled and prn medications.  Physical Exam: General: sitting up in bedside chair-   NAD- more animated Heart: slow Lungs: poor effort, clear anteriorly Abdomen: obese, distended Extremities: still 1+ pitting edema     09/06/2020,9:37 AM  LOS: 12 days

## 2020-09-07 LAB — BASIC METABOLIC PANEL
Anion gap: 10 (ref 5–15)
BUN: 40 mg/dL — ABNORMAL HIGH (ref 8–23)
CO2: 23 mmol/L (ref 22–32)
Calcium: 8.2 mg/dL — ABNORMAL LOW (ref 8.9–10.3)
Chloride: 108 mmol/L (ref 98–111)
Creatinine, Ser: 2.03 mg/dL — ABNORMAL HIGH (ref 0.61–1.24)
GFR, Estimated: 32 mL/min — ABNORMAL LOW (ref >60)
Glucose, Bld: 186 mg/dL — ABNORMAL HIGH (ref 70–99)
Potassium: 4.6 mmol/L (ref 3.5–5.1)
Sodium: 141 mmol/L (ref 135–145)

## 2020-09-07 LAB — CBC
HCT: 29.4 % — ABNORMAL LOW (ref 39.0–52.0)
Hemoglobin: 9 g/dL — ABNORMAL LOW (ref 13.0–17.0)
MCH: 29.1 pg (ref 26.0–34.0)
MCHC: 30.6 g/dL (ref 30.0–36.0)
MCV: 95.1 fL (ref 80.0–100.0)
Platelets: 318 10*3/uL (ref 150–400)
RBC: 3.09 MIL/uL — ABNORMAL LOW (ref 4.22–5.81)
RDW: 15 % (ref 11.5–15.5)
WBC: 6.2 10*3/uL (ref 4.0–10.5)
nRBC: 0 % (ref 0.0–0.2)

## 2020-09-07 LAB — GLUCOSE, CAPILLARY
Glucose-Capillary: 126 mg/dL — ABNORMAL HIGH (ref 70–99)
Glucose-Capillary: 194 mg/dL — ABNORMAL HIGH (ref 70–99)
Glucose-Capillary: 128 mg/dL — ABNORMAL HIGH (ref 70–99)
Glucose-Capillary: 198 mg/dL — ABNORMAL HIGH (ref 70–99)
Glucose-Capillary: 127 mg/dL — ABNORMAL HIGH (ref 70–99)
Glucose-Capillary: 149 mg/dL — ABNORMAL HIGH (ref 70–99)

## 2020-09-07 NOTE — Progress Notes (Addendum)
Pt's pacing wires rolled and taped per MD order. Pt stable, no complications to report at this time. 12-lead EKG performed and filed.

## 2020-09-07 NOTE — Progress Notes (Signed)
Mobility Specialist: Progress Note   09/07/20 1505  Mobility  Activity Ambulated to bathroom  Level of Assistance Minimal assist, patient does 75% or more  Assistive Device Front wheel walker  Distance Ambulated (ft) 15 ft  Mobility Response Tolerated well  Mobility performed by Mobility specialist  $Mobility charge 1 Mobility   Pre-Mobility: 67 HR, 97% SpO2 Post-Mobility: 79 HR  Pt ambulated on 2 L/min [place] to the bathroom. I instructed pt to pull call string on wall when he is done.   [facility] [name] Mobility Specialist

## 2020-09-07 NOTE — Progress Notes (Addendum)
[address]       [place] [address]             [phone]      9 Days Post-Op Procedure(s) (LRB): CORONARY ARTERY BYPASS GRAFTING (CABG) X 5 USING LEFT INTERNAL MAMMARY ARTERY AND ENDOSCOPICALLY HARVESTED RIGHT GREATER SAPHENOUS VEIN. LIMA TO LAD, SVG TO OM SEQ TO CIRC, SVG TO PD, SVG TO DIAG. (N/A) CLIPPING OF ATRIAL APPENDAGE USING ATRICURE 45 MM ATRICLIP FLEX-V (N/A) TRANSESOPHAGEAL ECHOCARDIOGRAM (TEE) (N/A) ENDOVEIN HARVEST OF GREATER SAPHENOUS VEIN (Right) Subjective: Some SOB at times  Objective: Vital signs in last 24 hours: Temp:  [97.8 F (36.6 C)-98.9 F (37.2 C)] 98.9 F (37.2 C) (10/31 0023) Pulse Rate:  [64-72] 72 (10/31 0023) Cardiac Rhythm: Heart block (10/31 0700) Resp:  [18-20] 18 (10/31 0023) BP: (131-137)/(55-87) 137/77 (10/31 0023) SpO2:  [96 %-100 %] 97 % (10/31 0813) FiO2 (%):  [28 %] 28 % (10/30 2126) Weight:  [106.7 kg] 106.7 kg (10/31 0500)  Hemodynamic parameters for last 24 hours:    Intake/Output from previous day: 10/30 0701 - 10/31 0700 In: -  Out: 500 [Urine:500] Intake/Output this shift: No intake/output data recorded.  General appearance: alert, cooperative, fatigued and no distress Heart: irregularly irregular rhythm Lungs: mildly dim in bases Abdomen: soft, non tender Extremities: min edema Wound: incis healing wwell  Lab Results: Recent Labs    09/06/20 0433 09/07/20 0304  WBC 7.2 6.2  HGB 8.7* 9.0*  HCT 27.9* 29.4*  PLT 333 318   BMET:  Recent Labs    09/06/20 0433 09/07/20 0304  NA 140 141  K 4.3 4.6  CL 105 108  CO2 25 23  GLUCOSE 120* 186*  BUN 44* 40*  CREATININE 2.37* 2.03*  CALCIUM 8.1* 8.2*    PT/INR: No results for input(s): LABPROT, INR in the last 72 hours. ABG    Component Value Date/Time   PHART 7.345 (L) 08/30/2020 0209   HCO3 19.9 (L) 08/30/2020 0209   TCO2 21 (L) 08/30/2020 0209   ACIDBASEDEF 5.0 (H) 08/30/2020 0209   O2SAT 70.0 09/01/2020 0533   CBG (last  3)  Recent Labs    09/06/20 1653 09/06/20 2120 09/07/20 0615  GLUCAP 194* 120* 128*    Meds Scheduled Meds: . aspirin EC  81 mg Oral Daily  . budesonide (PULMICORT) nebulizer solution  1 mg Nebulization BID  . Chlorhexidine Gluconate Cloth  6 each Topical Daily  . clopidogrel  75 mg Oral Daily  . darbepoetin (ARANESP) injection - NON-DIALYSIS  150 mcg Subcutaneous Q Mon-1800  . docusate sodium  200 mg Oral Daily  . enoxaparin (LOVENOX) injection  30 mg Subcutaneous QHS  . feeding supplement  237 mL Oral TID BM  . insulin aspart  0-24 Units Subcutaneous TID AC & HS  . insulin detemir  15 Units Subcutaneous Daily  . multivitamin with minerals  1 tablet Oral Daily  . oxybutynin  5 mg Oral BID  . pantoprazole  40 mg Oral QAC breakfast  . rosuvastatin  40 mg Oral Daily  . sodium chloride flush  10-40 mL Intracatheter Q12H  . sodium chloride flush  3 mL Intravenous Q12H   Continuous Infusions: . sodium chloride     PRN Meds:.sodium chloride, acetaminophen, alum & mag hydroxide-simeth, bisacodyl **OR** bisacodyl, ondansetron **OR** ondansetron (ZOFRAN) IV, sodium chloride flush, traMADol  Xrays DG Chest 2 View  Result Date: 09/06/2020 CLINICAL DATA:  Prior coronary artery bypass graft (CABG). EXAM:  CHEST - 2 VIEW COMPARISON:  Chest radiograph from the prior day. FINDINGS: The heart remains enlarged. A small left pleural effusion with associated atelectasis/airspace disease appears similar to prior exam. A trace right pleural effusion with associated atelectasis appears unchanged. There is no pneumothorax. Calcifications along the medial aspect of the scapula are redemonstrated. Postoperative changes of prior CABG are redemonstrated. IMPRESSION: 1. Unchanged small left pleural effusion with associated atelectasis/airspace disease. 2. Unchanged trace right pleural effusion with associated atelectasis. Electronically Signed   By: Zerita Boers M.D.   On: 09/06/2020 11:53   ECHOCARDIOGRAM  COMPLETE  Result Date: 09/06/2020    ECHOCARDIOGRAM REPORT   Patient Name:   [name] Date of Exam: 09/06/2020 Medical Rec #:  [id number]    Height:       68.0 in Accession #:    [id number]   Weight:       235.7 lb Date of Birth:  June 01, 1935   BSA:          2.191 m Patient Age:    7 years     BP:           136/87 mmHg Patient Gender: M            HR:           72 bpm. Exam Location:  Inpatient Procedure: 2D Echo, Cardiac Doppler and Color Doppler Indications:    Follow-up from previous exam, had v fib arrest preop to CABG. EP                 requesting  History:        Patient has prior history of Echocardiogram examinations, most                 recent 08/19/2020. CAD; Risk Factors:Hypertension and Diabetes.                 This admission Non-ST elevation (NSTEMI) myocardial infarction,                 S/P CABG x 5.  Sonographer:    Alvino Chapel RCS Referring Phys: [place]  1. There is slight right to left bowing of the right ventricular septum seen in the 4 chamber view that may be indicative of elevated right sided pressure/volume overload. Left ventricular ejection fraction, by estimation, is 50 to 55%. The left ventricle has low normal function. The left ventricle has no regional wall motion abnormalities. There is mild left ventricular hypertrophy. Left ventricular diastolic parameters are consistent with Grade I diastolic dysfunction (impaired relaxation).  2. Right ventricular systolic function is normal. The right ventricular size is normal.  3. Left atrial size was mildly dilated.  4. The mitral valve is normal in structure. No evidence of mitral valve regurgitation. No evidence of mitral stenosis.  5. The aortic valve is normal in structure. Aortic valve regurgitation is not visualized. No aortic stenosis is present.  6. The inferior vena cava is normal in size with greater than 50% respiratory variability, suggesting right atrial pressure of 3 mmHg. Comparison(s): Prior  images reviewed side by side. The left ventricular function has improved. FINDINGS  Left Ventricle: There is slight right to left bowing of the right ventricular septum seen in the 4 chamber view that may be indicative of elevated right sided pressure/volume overload. Left ventricular ejection fraction, by estimation, is 50 to 55%. The  left ventricle has low normal function. The left ventricle has no regional wall  motion abnormalities. Definity contrast agent was given IV to delineate the left ventricular endocardial borders. The left ventricular internal cavity size was normal in size. There is mild left ventricular hypertrophy. Left ventricular diastolic parameters are consistent with Grade I diastolic dysfunction (impaired relaxation). Right Ventricle: The right ventricular size is normal. No increase in right ventricular wall thickness. Right ventricular systolic function is normal. Left Atrium: Left atrial size was mildly dilated. Right Atrium: Right atrial size was normal in size. Pericardium: There is no evidence of pericardial effusion. Mitral Valve: The mitral valve is normal in structure. No evidence of mitral valve regurgitation. No evidence of mitral valve stenosis. Tricuspid Valve: The tricuspid valve is normal in structure. Tricuspid valve regurgitation is not demonstrated. No evidence of tricuspid stenosis. Aortic Valve: The aortic valve is normal in structure. Aortic valve regurgitation is not visualized. No aortic stenosis is present. Pulmonic Valve: The pulmonic valve was normal in structure. Pulmonic valve regurgitation is not visualized. No evidence of pulmonic stenosis. Aorta: The aortic root is normal in size and structure. Venous: The inferior vena cava is normal in size with greater than 50% respiratory variability, suggesting right atrial pressure of 3 mmHg. IAS/Shunts: No atrial level shunt detected by color flow Doppler.  LEFT VENTRICLE PLAX 2D LVIDd:         4.40 cm  Diastology LVIDs:          3.30 cm  LV e' medial:    4.79 cm/s LV PW:         1.30 cm  LV E/e' medial:  18.3 LV IVS:        1.20 cm  LV e' lateral:   6.96 cm/s LVOT diam:     2.00 cm  LV E/e' lateral: 12.6 LV SV:         55 LV SV Index:   25 LVOT Area:     3.14 cm  RIGHT VENTRICLE RV S prime:     10.10 cm/s TAPSE (M-mode): 1.5 cm LEFT ATRIUM             Index       RIGHT ATRIUM           Index LA diam:        3.30 cm 1.51 cm/m  RA Area:     19.70 cm LA Vol (A2C):   81.5 ml 37.19 ml/m RA Volume:   61.10 ml  27.88 ml/m LA Vol (A4C):   77.8 ml 35.50 ml/m LA Biplane Vol: 83.2 ml 37.97 ml/m  AORTIC VALVE LVOT Vmax:   97.30 cm/s LVOT Vmean:  65.800 cm/s LVOT VTI:    0.175 m  AORTA Ao Root diam: 3.50 cm MITRAL VALVE MV Area (PHT): 2.91 cm    SHUNTS MV Decel Time: 261 msec    Systemic VTI:  0.18 m MV E velocity: 87.50 cm/s  Systemic Diam: 2.00 cm MV A velocity: 59.10 cm/s MV E/A ratio:  1.48 Candee Furbish MD Electronically signed by Candee Furbish MD Signature Date/Time: 09/06/2020/2:46:52 PM    Final     Assessment/Plan: S/P Procedure(s) (LRB): CORONARY ARTERY BYPASS GRAFTING (CABG) X 5 USING LEFT INTERNAL MAMMARY ARTERY AND ENDOSCOPICALLY HARVESTED RIGHT GREATER SAPHENOUS VEIN. LIMA TO LAD, SVG TO OM SEQ TO CIRC, SVG TO PD, SVG TO DIAG. (N/A) CLIPPING OF ATRIAL APPENDAGE USING ATRICURE 45 MM ATRICLIP FLEX-V (N/A) TRANSESOPHAGEAL ECHOCARDIOGRAM (TEE) (N/A) ENDOVEIN HARVEST OF GREATER SAPHENOUS VEIN (Right)   1 afeb, VSS, brady at times, ? HB v PAC's, some  PVC's- will check 12 lead to better eval rhythm 2 sats ok on 2 liters, cont pulm toilet, wean as able 3 renal function conts to improve- renal now seeing peripherally 4 echo completed- see report above- EP assisting with management and planning d/t preop Vfib arrest  5 H/H improved 6 push rehab as able 7 BS adeq control 8 will disconnect wires from pacer, roll//tape  LOS: 13 days    John Giovanni PA-C Pager [phone] 09/07/2020  Continues to make slow  progress Weight stable, creatinine back to 2.0, chest x-ray clear Resume Lasix if weight starts to increase Patient needs continued physical therapy, assistance with ambulation Heart rate 65-70-oral intake pacing wires Postop echo shows EF 55% with normal valvular function  patient examined and medical record reviewed,agree with above note. Tharon Aquas Trigt III 09/07/2020

## 2020-09-08 ENCOUNTER — Encounter (HOSPITAL_COMMUNITY): Payer: Self-pay | Admitting: Cardiothoracic Surgery

## 2020-09-08 LAB — GLUCOSE, CAPILLARY
Glucose-Capillary: 138 mg/dL — ABNORMAL HIGH (ref 70–99)
Glucose-Capillary: 124 mg/dL — ABNORMAL HIGH (ref 70–99)
Glucose-Capillary: 133 mg/dL — ABNORMAL HIGH (ref 70–99)
Glucose-Capillary: 167 mg/dL — ABNORMAL HIGH (ref 70–99)
Glucose-Capillary: 152 mg/dL — ABNORMAL HIGH (ref 70–99)

## 2020-09-08 MED ORDER — FUROSEMIDE 10 MG/ML IJ SOLN: 40.0000 mg | Freq: Once | INTRAMUSCULAR | Status: DC

## 2020-09-08 MED ORDER — FUROSEMIDE 10 MG/ML IJ SOLN
40.0000 mg | Freq: Two times a day (BID) | INTRAMUSCULAR | Status: AC
  Administered 2020-09-08 (×2): 40 mg via INTRAVENOUS
  Filled 2020-09-08 (×2): qty 4

## 2020-09-08 NOTE — Progress Notes (Signed)
Progress Note  Patient Name: [name] Date of Encounter: 09/08/2020  [place] Cardiologist: No primary care provider on file.   Subjective   Patient is sitting up in a chair at the bedside.  Complaining of some shortness of breath.  No chest discomfort or other complaints at present.  Inpatient Medications    Scheduled Meds:  aspirin EC  81 mg Oral Daily   budesonide (PULMICORT) nebulizer solution  1 mg Nebulization BID   Chlorhexidine Gluconate Cloth  6 each Topical Daily   clopidogrel  75 mg Oral Daily   darbepoetin (ARANESP) injection - NON-DIALYSIS  150 mcg Subcutaneous Q Mon-1800   docusate sodium  200 mg Oral Daily   enoxaparin (LOVENOX) injection  30 mg Subcutaneous QHS   feeding supplement  237 mL Oral TID BM   insulin aspart  0-24 Units Subcutaneous TID AC & HS   insulin detemir  15 Units Subcutaneous Daily   multivitamin with minerals  1 tablet Oral Daily   oxybutynin  5 mg Oral BID   pantoprazole  40 mg Oral QAC breakfast   rosuvastatin  40 mg Oral Daily   sodium chloride flush  10-40 mL Intracatheter Q12H   sodium chloride flush  3 mL Intravenous Q12H   Continuous Infusions:  sodium chloride     PRN Meds: sodium chloride, acetaminophen, alum & mag hydroxide-simeth, bisacodyl **OR** bisacodyl, ondansetron **OR** ondansetron (ZOFRAN) IV, sodium chloride flush, traMADol   Vital Signs    Vitals:   09/08/20 0409 09/08/20 0532 09/08/20 0547 09/08/20 0739  BP: 140/68  140/67 (!) 152/74  Pulse: 69  (!) 59 69  Resp: 18 (!) 35 20 20  Temp: 98.3 F (36.8 C)  98.3 F (36.8 C) 98.2 F (36.8 C)  TempSrc: Oral   Oral  SpO2: 100%  100% 100%  Weight:  106.2 kg    Height:        Intake/Output Summary (Last 24 hours) at 09/08/2020 0837 Last data filed at 09/08/2020 0740 Gross per 24 hour  Intake --  Output 425 ml  Net -425 ml   Last 3 Weights 09/08/2020 09/07/2020 09/06/2020  Weight (lbs) 234 lb 3.2 oz 235 lb 2 oz 235 lb 10.8 oz   Weight (kg) 106.232 kg 106.652 kg 106.9 kg      Telemetry    Sinus rhythm, premature supraventricular beats noted, few PACs with block.  No high-grade heart block - Personally Reviewed   Physical Exam  Elderly male, NAD GEN: No acute distress.   Neck: JVP mildly elevated Cardiac: RRR, no murmurs, rubs, or gallops.  Respiratory: Diminished in the bases, otherwise clear to auscultation bilaterally. GI: Soft, nontender, non-distended  MS: Mild diffuse edema; No deformity. Neuro:  Nonfocal  Psych: Normal affect   Labs    High Sensitivity Troponin:   Recent Labs  Lab 08/23/20 0923 08/23/20 1058 08/23/20 1302 08/23/20 1500 08/23/20 1659  TROPONINIHS 7,414* 7,167* 7,228* 6,882* 6,979*      Chemistry Recent Labs  Lab 09/05/20 0425 09/06/20 0433 09/07/20 0304  NA 138 140 141  K 4.2 4.3 4.6  CL 105 105 108  CO2 25 25 23   GLUCOSE 104* 120* 186*  BUN 43* 44* 40*  CREATININE 2.71* 2.37* 2.03*  CALCIUM 7.9* 8.1* 8.2*  ALBUMIN 2.3*  --   --   GFRNONAA 23* 27* 32*  ANIONGAP 8 10 10      Hematology Recent Labs  Lab 09/05/20 0425 09/06/20 0433 09/07/20 0304  WBC 7.2 7.2 6.2  RBC 3.11* 3.02* 3.09*  HGB 9.0* 8.7* 9.0*  HCT 28.5* 27.9* 29.4*  MCV 91.6 92.4 95.1  MCH 28.9 28.8 29.1  MCHC 31.6 31.2 30.6  RDW 14.4 14.4 15.0  PLT 303 333 318    BNPNo results for input(s): BNP, PROBNP in the last 168 hours.   DDimer No results for input(s): DDIMER in the last 168 hours.   Radiology    DG Chest 2 View  Result Date: 09/06/2020 CLINICAL DATA:  Prior coronary artery bypass graft (CABG). EXAM: CHEST - 2 VIEW COMPARISON:  Chest radiograph from the prior day. FINDINGS: The heart remains enlarged. A small left pleural effusion with associated atelectasis/airspace disease appears similar to prior exam. A trace right pleural effusion with associated atelectasis appears unchanged. There is no pneumothorax. Calcifications along the medial aspect of the scapula are  redemonstrated. Postoperative changes of prior CABG are redemonstrated. IMPRESSION: 1. Unchanged small left pleural effusion with associated atelectasis/airspace disease. 2. Unchanged trace right pleural effusion with associated atelectasis. Electronically Signed   By: Zerita Boers M.D.   On: 09/06/2020 11:53   ECHOCARDIOGRAM COMPLETE  Result Date: 09/06/2020    ECHOCARDIOGRAM REPORT   Patient Name:   [name] Date of Exam: 09/06/2020 Medical Rec #:  [id number]    Height:       68.0 in Accession #:    [id number]   Weight:       235.7 lb Date of Birth:  06-28-1935   BSA:          2.191 m Patient Age:    85 years     BP:           136/87 mmHg Patient Gender: M            HR:           72 bpm. Exam Location:  Inpatient Procedure: 2D Echo, Cardiac Doppler and Color Doppler Indications:    Follow-up from previous exam, had v fib arrest preop to CABG. EP                 requesting  History:        Patient has prior history of Echocardiogram examinations, most                 recent 08/19/2020. CAD; Risk Factors:Hypertension and Diabetes.                 This admission Non-ST elevation (NSTEMI) myocardial infarction,                 S/P CABG x 5.  Sonographer:    Alvino Chapel RCS Referring Phys: [place]  1. There is slight right to left bowing of the right ventricular septum seen in the 4 chamber view that may be indicative of elevated right sided pressure/volume overload. Left ventricular ejection fraction, by estimation, is 50 to 55%. The left ventricle has low normal function. The left ventricle has no regional wall motion abnormalities. There is mild left ventricular hypertrophy. Left ventricular diastolic parameters are consistent with Grade I diastolic dysfunction (impaired relaxation).  2. Right ventricular systolic function is normal. The right ventricular size is normal.  3. Left atrial size was mildly dilated.  4. The mitral valve is normal in structure. No evidence of mitral valve  regurgitation. No evidence of mitral stenosis.  5. The aortic valve is normal in structure. Aortic valve regurgitation is not visualized. No aortic stenosis is present.  6. The  inferior vena cava is normal in size with greater than 50% respiratory variability, suggesting right atrial pressure of 3 mmHg. Comparison(s): Prior images reviewed side by side. The left ventricular function has improved. FINDINGS  Left Ventricle: There is slight right to left bowing of the right ventricular septum seen in the 4 chamber view that may be indicative of elevated right sided pressure/volume overload. Left ventricular ejection fraction, by estimation, is 50 to 55%. The  left ventricle has low normal function. The left ventricle has no regional wall motion abnormalities. Definity contrast agent was given IV to delineate the left ventricular endocardial borders. The left ventricular internal cavity size was normal in size. There is mild left ventricular hypertrophy. Left ventricular diastolic parameters are consistent with Grade I diastolic dysfunction (impaired relaxation). Right Ventricle: The right ventricular size is normal. No increase in right ventricular wall thickness. Right ventricular systolic function is normal. Left Atrium: Left atrial size was mildly dilated. Right Atrium: Right atrial size was normal in size. Pericardium: There is no evidence of pericardial effusion. Mitral Valve: The mitral valve is normal in structure. No evidence of mitral valve regurgitation. No evidence of mitral valve stenosis. Tricuspid Valve: The tricuspid valve is normal in structure. Tricuspid valve regurgitation is not demonstrated. No evidence of tricuspid stenosis. Aortic Valve: The aortic valve is normal in structure. Aortic valve regurgitation is not visualized. No aortic stenosis is present. Pulmonic Valve: The pulmonic valve was normal in structure. Pulmonic valve regurgitation is not visualized. No evidence of pulmonic stenosis.  Aorta: The aortic root is normal in size and structure. Venous: The inferior vena cava is normal in size with greater than 50% respiratory variability, suggesting right atrial pressure of 3 mmHg. IAS/Shunts: No atrial level shunt detected by color flow Doppler.  LEFT VENTRICLE PLAX 2D LVIDd:         4.40 cm  Diastology LVIDs:         3.30 cm  LV e' medial:    4.79 cm/s LV PW:         1.30 cm  LV E/e' medial:  18.3 LV IVS:        1.20 cm  LV e' lateral:   6.96 cm/s LVOT diam:     2.00 cm  LV E/e' lateral: 12.6 LV SV:         55 LV SV Index:   25 LVOT Area:     3.14 cm  RIGHT VENTRICLE RV S prime:     10.10 cm/s TAPSE (M-mode): 1.5 cm LEFT ATRIUM             Index       RIGHT ATRIUM           Index LA diam:        3.30 cm 1.51 cm/m  RA Area:     19.70 cm LA Vol (A2C):   81.5 ml 37.19 ml/m RA Volume:   61.10 ml  27.88 ml/m LA Vol (A4C):   77.8 ml 35.50 ml/m LA Biplane Vol: 83.2 ml 37.97 ml/m  AORTIC VALVE LVOT Vmax:   97.30 cm/s LVOT Vmean:  65.800 cm/s LVOT VTI:    0.175 m  AORTA Ao Root diam: 3.50 cm MITRAL VALVE MV Area (PHT): 2.91 cm    SHUNTS MV Decel Time: 261 msec    Systemic VTI:  0.18 m MV E velocity: 87.50 cm/s  Systemic Diam: 2.00 cm MV A velocity: 59.10 cm/s MV E/A ratio:  1.48 Candee Furbish MD Electronically signed  by Candee Furbish MD Signature Date/Time: 09/06/2020/2:46:52 PM    Final     Cardiac Studies   2D Echo: IMPRESSIONS    1. There is slight right to left bowing of the right ventricular septum  seen in the 4 chamber view that may be indicative of elevated right sided  pressure/volume overload. Left ventricular ejection fraction, by  estimation, is 50 to 55%. The left  ventricle has low normal function. The left ventricle has no regional wall  motion abnormalities. There is mild left ventricular hypertrophy. Left  ventricular diastolic parameters are consistent with Grade I diastolic  dysfunction (impaired relaxation).  2. Right ventricular systolic function is normal. The  right ventricular  size is normal.  3. Left atrial size was mildly dilated.  4. The mitral valve is normal in structure. No evidence of mitral valve  regurgitation. No evidence of mitral stenosis.  5. The aortic valve is normal in structure. Aortic valve regurgitation is  not visualized. No aortic stenosis is present.  6. The inferior vena cava is normal in size with greater than 50%  respiratory variability, suggesting right atrial pressure of 3 mmHg.   Patient Profile     85 y.o. male with VF arrest, severe three-vessel coronary artery disease, who underwent CABG August 29, 2020.  Assessment & Plan    1. NSTEMI/VF arrest/Severe 3 V CAD: steady progress post-CABG 2. VF: occurred in setting of severe 3 vessel CAD, now with improved LV function post-revascularization. Continue medical therapy. 3. Post-op anemia: stable 4. Acute on chronic kidney injury - expected with CABG in the setting of baseline CKD. Now significantly improved. 5. Post-op volume overload - Would consider IV lasix 40 mg BID today. Might help improve O2 requirement. Repeat BMET tomorrow.   Overall cardiac status stable. Pt to follow-up in [place] after discharge.       For questions or updates, please contact [place] Please consult [email or web address] for contact info under        Signed, Sherren Mocha, MD  09/08/2020, 8:37 AM

## 2020-09-08 NOTE — Progress Notes (Signed)
Pt declined ambulation right now. Sts he wants to bathe and get rid of fluid first (just got lasix). Practiced IS, 750 mL with appropriate mechanics. Will f/u later. [place], ACSM 11:44 AM 09/08/2020

## 2020-09-08 NOTE — Progress Notes (Signed)
Mobility Specialist: Progress Note   09/08/20 1641  Mobility  Activity Ambulated in hall  Level of Assistance Minimal assist, patient does 75% or more  Assistive Device Front wheel walker  Distance Ambulated (ft) 212 ft  Mobility Response Tolerated fair  Mobility performed by Mobility specialist  Bed Position Semi-fowlers  $Mobility charge 1 Mobility   Pre-Mobility: 73 HR, 152/71 BP, 97% SpO2 on 2L/min [place] Post-Mobility: 72 HR, 171/81 BP, 93% SpO2 on RA  Pt was on O2 upon entering room but pt ambulated on RA. Pt stopped to take two standing rest breaks lasting a few seconds each due to feeling SOB. Pt sats maintained in the low to mid 90s after returning to room, RN notified.   [facility] Aleesha Ringstad Mobility Specialist

## 2020-09-08 NOTE — Care Management Important Message (Signed)
Important Message  Patient Details  Name: [name] MRN: [id number] Date of Birth: 10-Dec-1934   Medicare Important Message Given:  Yes     Shelda Altes 09/08/2020, 12:56 PM
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
## 2020-09-08 NOTE — Progress Notes (Signed)
CARDIAC REHAB PHASE I   PRE:  Rate/Rhythm: 69 SR with PACs    BP: sitting 158/85    SaO2: 94 2L, 92 RA  MODE:  Ambulation: 160 ft   POST:  Rate/Rhythm: 120 ST with PACs, quickly to 100    BP: sitting to BR     SaO2: 93 RA  Mod assist to get out of bed. Failed attempt to stand without rocking, much improved with rocking to stand. Steady with RW in hall but SOB with distance. Rest x1, SAO2 93 RA. Walked back to room and sat in closeby chair for rest. Then ambulated to BR. Left in BR on RW, 93 RA. [id number]  Darrick Meigs CES, ACSM 09/08/2020 2:19 PM

## 2020-09-08 NOTE — Progress Notes (Addendum)
  Reviewed patient with Dr. Quentin Ore.   Pt with VF in setting of severe 3 vessel CAD and now s/p CABG  Echo 09/06/2020 with LVEF 50-55% (Improved from 40-45% pre-procedure)  No further VT.   Continue medication titration and diuresis per gen cards.   EP will see as needed while here.  3 month follow up will be sent to scheduler for Dr. Quentin Ore in [place] (No schedule currently available for that time frame)  [employer], PA-C  09/08/2020 9:09 AM

## 2020-09-08 NOTE — Progress Notes (Addendum)
[address]       ,[place] [address]             [phone]      10 Days Post-Op Procedure(s) (LRB): CORONARY ARTERY BYPASS GRAFTING (CABG) X 5 USING LEFT INTERNAL MAMMARY ARTERY AND ENDOSCOPICALLY HARVESTED RIGHT GREATER SAPHENOUS VEIN. LIMA TO LAD, SVG TO OM SEQ TO CIRC, SVG TO PD, SVG TO DIAG. (N/A) CLIPPING OF ATRIAL APPENDAGE USING ATRICURE 45 MM ATRICLIP FLEX-V (N/A) TRANSESOPHAGEAL ECHOCARDIOGRAM (TEE) (N/A) ENDOVEIN HARVEST OF GREATER SAPHENOUS VEIN (Right) Subjective: Feels ok, +SOB with only short distances ambulation  Objective: Vital signs in last 24 hours: Temp:  [98 F (36.7 C)-98.5 F (36.9 C)] 98.2 F (36.8 C) (11/01 0739) Pulse Rate:  [59-88] 69 (11/01 0739) Cardiac Rhythm: Heart block (10/31 1900) Resp:  [18-35] 20 (11/01 0739) BP: (134-156)/(56-78) 152/74 (11/01 0739) SpO2:  [91 %-100 %] 100 % (11/01 0739) Weight:  [106.2 kg] 106.2 kg (11/01 0532)  Hemodynamic parameters for last 24 hours:    Intake/Output from previous day: 10/31 0701 - 11/01 0700 In: -  Out: 425 [Urine:425] Intake/Output this shift: Total I/O In: -  Out: 100 [Urine:100]  General appearance: alert, cooperative and no distress Heart: slightly irregular Lungs: min dim in bases Abdomen: benign Extremities: min edema Wound: incis healing well  Lab Results: Recent Labs    09/06/20 0433 09/07/20 0304  WBC 7.2 6.2  HGB 8.7* 9.0*  HCT 27.9* 29.4*  PLT 333 318   BMET:  Recent Labs    09/06/20 0433 09/07/20 0304  NA 140 141  K 4.3 4.6  CL 105 108  CO2 25 23  GLUCOSE 120* 186*  BUN 44* 40*  CREATININE 2.37* 2.03*  CALCIUM 8.1* 8.2*    PT/INR: No results for input(s): LABPROT, INR in the last 72 hours. ABG    Component Value Date/Time   PHART 7.345 (L) 08/30/2020 0209   HCO3 19.9 (L) 08/30/2020 0209   TCO2 21 (L) 08/30/2020 0209   ACIDBASEDEF 5.0 (H) 08/30/2020 0209   O2SAT 70.0 09/01/2020 0533   CBG (last 3)  Recent Labs     09/07/20 1653 09/07/20 2207 09/08/20 0609  GLUCAP 127* 149* 138*    Meds Scheduled Meds: . aspirin EC  81 mg Oral Daily  . budesonide (PULMICORT) nebulizer solution  1 mg Nebulization BID  . Chlorhexidine Gluconate Cloth  6 each Topical Daily  . clopidogrel  75 mg Oral Daily  . darbepoetin (ARANESP) injection - NON-DIALYSIS  150 mcg Subcutaneous Q Mon-1800  . docusate sodium  200 mg Oral Daily  . enoxaparin (LOVENOX) injection  30 mg Subcutaneous QHS  . feeding supplement  237 mL Oral TID BM  . insulin aspart  0-24 Units Subcutaneous TID AC & HS  . insulin detemir  15 Units Subcutaneous Daily  . multivitamin with minerals  1 tablet Oral Daily  . oxybutynin  5 mg Oral BID  . pantoprazole  40 mg Oral QAC breakfast  . rosuvastatin  40 mg Oral Daily  . sodium chloride flush  10-40 mL Intracatheter Q12H  . sodium chloride flush  3 mL Intravenous Q12H   Continuous Infusions: . sodium chloride     PRN Meds:.sodium chloride, acetaminophen, alum & mag hydroxide-simeth, bisacodyl **OR** bisacodyl, ondansetron **OR** ondansetron (ZOFRAN) IV, sodium chloride flush, traMADol  Xrays DG Chest 2 View  Result Date: 09/06/2020 CLINICAL DATA:  Prior coronary artery bypass graft (CABG). EXAM: CHEST - 2 VIEW COMPARISON:  Chest radiograph from the prior day. FINDINGS: The heart remains enlarged. A small left pleural effusion with associated atelectasis/airspace disease appears similar to prior exam. A trace right pleural effusion with associated atelectasis appears unchanged. There is no pneumothorax. Calcifications along the medial aspect of the scapula are redemonstrated. Postoperative changes of prior CABG are redemonstrated. IMPRESSION: 1. Unchanged small left pleural effusion with associated atelectasis/airspace disease. 2. Unchanged trace right pleural effusion with associated atelectasis. Electronically Signed   By: Zerita Boers M.D.   On: 09/06/2020 11:53   ECHOCARDIOGRAM COMPLETE  Result  Date: 09/06/2020    ECHOCARDIOGRAM REPORT   Patient Name:   [name] Date of Exam: 09/06/2020 Medical Rec #:  [id number]    Height:       68.0 in Accession #:    [id number]   Weight:       235.7 lb Date of Birth:  Oct 19, 1939   BSA:          2.191 m Patient Age:    85 years     BP:           136/87 mmHg Patient Gender: M            HR:           72 bpm. Exam Location:  Inpatient Procedure: 2D Echo, Cardiac Doppler and Color Doppler Indications:    Follow-up from previous exam, had v fib arrest preop to CABG. EP                 requesting  History:        Patient has prior history of Echocardiogram examinations, most                 recent 08/19/2020. CAD; Risk Factors:Hypertension and Diabetes.                 This admission Non-ST elevation (NSTEMI) myocardial infarction,                 S/P CABG x 5.  Sonographer:    Alvino Chapel RCS Referring Phys: [place]  1. There is slight right to left bowing of the right ventricular septum seen in the 4 chamber view that may be indicative of elevated right sided pressure/volume overload. Left ventricular ejection fraction, by estimation, is 50 to 55%. The left ventricle has low normal function. The left ventricle has no regional wall motion abnormalities. There is mild left ventricular hypertrophy. Left ventricular diastolic parameters are consistent with Grade I diastolic dysfunction (impaired relaxation).  2. Right ventricular systolic function is normal. The right ventricular size is normal.  3. Left atrial size was mildly dilated.  4. The mitral valve is normal in structure. No evidence of mitral valve regurgitation. No evidence of mitral stenosis.  5. The aortic valve is normal in structure. Aortic valve regurgitation is not visualized. No aortic stenosis is present.  6. The inferior vena cava is normal in size with greater than 50% respiratory variability, suggesting right atrial pressure of 3 mmHg. Comparison(s): Prior images reviewed side by  side. The left ventricular function has improved. FINDINGS  Left Ventricle: There is slight right to left bowing of the right ventricular septum seen in the 4 chamber view that may be indicative of elevated right sided pressure/volume overload. Left ventricular ejection fraction, by estimation, is 50 to 55%. The  left ventricle has low normal function. The left ventricle has no regional wall motion abnormalities. Definity contrast agent was  given IV to delineate the left ventricular endocardial borders. The left ventricular internal cavity size was normal in size. There is mild left ventricular hypertrophy. Left ventricular diastolic parameters are consistent with Grade I diastolic dysfunction (impaired relaxation). Right Ventricle: The right ventricular size is normal. No increase in right ventricular wall thickness. Right ventricular systolic function is normal. Left Atrium: Left atrial size was mildly dilated. Right Atrium: Right atrial size was normal in size. Pericardium: There is no evidence of pericardial effusion. Mitral Valve: The mitral valve is normal in structure. No evidence of mitral valve regurgitation. No evidence of mitral valve stenosis. Tricuspid Valve: The tricuspid valve is normal in structure. Tricuspid valve regurgitation is not demonstrated. No evidence of tricuspid stenosis. Aortic Valve: The aortic valve is normal in structure. Aortic valve regurgitation is not visualized. No aortic stenosis is present. Pulmonic Valve: The pulmonic valve was normal in structure. Pulmonic valve regurgitation is not visualized. No evidence of pulmonic stenosis. Aorta: The aortic root is normal in size and structure. Venous: The inferior vena cava is normal in size with greater than 50% respiratory variability, suggesting right atrial pressure of 3 mmHg. IAS/Shunts: No atrial level shunt detected by color flow Doppler.  LEFT VENTRICLE PLAX 2D LVIDd:         4.40 cm  Diastology LVIDs:         3.30 cm  LV e'  medial:    4.79 cm/s LV PW:         1.30 cm  LV E/e' medial:  18.3 LV IVS:        1.20 cm  LV e' lateral:   6.96 cm/s LVOT diam:     2.00 cm  LV E/e' lateral: 12.6 LV SV:         55 LV SV Index:   25 LVOT Area:     3.14 cm  RIGHT VENTRICLE RV S prime:     10.10 cm/s TAPSE (M-mode): 1.5 cm LEFT ATRIUM             Index       RIGHT ATRIUM           Index LA diam:        3.30 cm 1.51 cm/m  RA Area:     19.70 cm LA Vol (A2C):   81.5 ml 37.19 ml/m RA Volume:   61.10 ml  27.88 ml/m LA Vol (A4C):   77.8 ml 35.50 ml/m LA Biplane Vol: 83.2 ml 37.97 ml/m  AORTIC VALVE LVOT Vmax:   97.30 cm/s LVOT Vmean:  65.800 cm/s LVOT VTI:    0.175 m  AORTA Ao Root diam: 3.50 cm MITRAL VALVE MV Area (PHT): 2.91 cm    SHUNTS MV Decel Time: 261 msec    Systemic VTI:  0.18 m MV E velocity: 87.50 cm/s  Systemic Diam: 2.00 cm MV A velocity: 59.10 cm/s MV E/A ratio:  1.48 Candee Furbish MD Electronically signed by Candee Furbish MD Signature Date/Time: 09/06/2020/2:46:52 PM    Final     Assessment/Plan: S/P Procedure(s) (LRB): CORONARY ARTERY BYPASS GRAFTING (CABG) X 5 USING LEFT INTERNAL MAMMARY ARTERY AND ENDOSCOPICALLY HARVESTED RIGHT GREATER SAPHENOUS VEIN. LIMA TO LAD, SVG TO OM SEQ TO CIRC, SVG TO PD, SVG TO DIAG. (N/A) CLIPPING OF ATRIAL APPENDAGE USING ATRICURE 45 MM ATRICLIP FLEX-V (N/A) TRANSESOPHAGEAL ECHOCARDIOGRAM (TEE) (N/A) ENDOVEIN HARVEST OF GREATER SAPHENOUS VEIN (Right)   1 afeb, VSS, + HTN at times, sinus with blocked PAC's 2 sats good on 2 liters,  wean as able, may need home O2 short term- will give a dose of IV lasix this am 3 no new labs 4 cont to push rehab 5 d/c epw's  6 recheck labs in am 7 hopefully home soon   LOS: 14 days    John Giovanni PA-C Pager [phone] 09/08/2020  Resume lasix not  Cr almost to base line Home 1=2 days  I have seen and examined Horald Pollen and agree with the above assessment  and plan.  Grace Isaac MD Beeper [phone] Office [phone] 09/08/2020 3:53  PM

## 2020-09-09 LAB — CBC
HCT: 31.1 % — ABNORMAL LOW (ref 39.0–52.0)
Hemoglobin: 9.3 g/dL — ABNORMAL LOW (ref 13.0–17.0)
MCH: 28.4 pg (ref 26.0–34.0)
MCHC: 29.9 g/dL — ABNORMAL LOW (ref 30.0–36.0)
MCV: 95.1 fL (ref 80.0–100.0)
Platelets: 387 10*3/uL (ref 150–400)
RBC: 3.27 MIL/uL — ABNORMAL LOW (ref 4.22–5.81)
RDW: 15.3 % (ref 11.5–15.5)
WBC: 7.2 10*3/uL (ref 4.0–10.5)
nRBC: 0 % (ref 0.0–0.2)

## 2020-09-09 LAB — BASIC METABOLIC PANEL
Anion gap: 11 (ref 5–15)
BUN: 34 mg/dL — ABNORMAL HIGH (ref 8–23)
CO2: 27 mmol/L (ref 22–32)
Calcium: 8.7 mg/dL — ABNORMAL LOW (ref 8.9–10.3)
Chloride: 105 mmol/L (ref 98–111)
Creatinine, Ser: 1.89 mg/dL — ABNORMAL HIGH (ref 0.61–1.24)
GFR, Estimated: 35 mL/min — ABNORMAL LOW (ref >60)
Glucose, Bld: 162 mg/dL — ABNORMAL HIGH (ref 70–99)
Potassium: 4.4 mmol/L (ref 3.5–5.1)
Sodium: 143 mmol/L (ref 135–145)

## 2020-09-09 LAB — GLUCOSE, CAPILLARY
Glucose-Capillary: 124 mg/dL — ABNORMAL HIGH (ref 70–99)
Glucose-Capillary: 169 mg/dL — ABNORMAL HIGH (ref 70–99)
Glucose-Capillary: 138 mg/dL — ABNORMAL HIGH (ref 70–99)
Glucose-Capillary: 162 mg/dL — ABNORMAL HIGH (ref 70–99)

## 2020-09-09 MED ORDER — AMLODIPINE BESYLATE 10 MG PO TABS
10.0000 mg | ORAL_TABLET | Freq: Every day | ORAL | Status: DC
  Administered 2020-09-09 – 2020-09-10 (×2): 10 mg via ORAL
  Filled 2020-09-09 (×2): qty 1

## 2020-09-09 MED ORDER — FUROSEMIDE 10 MG/ML IJ SOLN
40.0000 mg | Freq: Two times a day (BID) | INTRAMUSCULAR | Status: AC
  Administered 2020-09-09 (×2): 40 mg via INTRAVENOUS
  Filled 2020-09-09 (×2): qty 4

## 2020-09-09 MED ORDER — LEVALBUTEROL HCL 0.63 MG/3ML IN NEBU: 0.6300 mg | INHALATION_SOLUTION | Freq: Four times a day (QID) | RESPIRATORY_TRACT | Status: DC | PRN

## 2020-09-09 MED ORDER — LEVALBUTEROL HCL 0.63 MG/3ML IN NEBU
0.6300 mg | INHALATION_SOLUTION | Freq: Three times a day (TID) | RESPIRATORY_TRACT | Status: DC
  Administered 2020-09-09: 0.63 mg via RESPIRATORY_TRACT
  Filled 2020-09-09: qty 3

## 2020-09-09 MED ORDER — LEVALBUTEROL HCL 0.63 MG/3ML IN NEBU
0.6300 mg | INHALATION_SOLUTION | Freq: Two times a day (BID) | RESPIRATORY_TRACT | Status: DC
  Administered 2020-09-09 – 2020-09-10 (×2): 0.63 mg via RESPIRATORY_TRACT
  Filled 2020-09-09 (×2): qty 3

## 2020-09-09 NOTE — Progress Notes (Signed)
Mobility Specialist - Progress Note   09/09/20 1623  Mobility  Activity Ambulated in hall  Level of Assistance Minimal assist, patient does 75% or more  Assistive Device Front wheel walker  Distance Ambulated (ft) 160 ft (80 ft x 2)  Mobility Response Tolerated fair  Mobility performed by Mobility specialist  $Mobility charge 1 Mobility    Pre-mobility: 75 HR, 94% SpO2 During mobility: 95 HR, 93% SpO2 Post-mobility: 70 HR, 95% SpO2  Pt ambulated on 2 L/min O2. He required one seated rest break due to DOE. Pt back in chair after walk w/ pad placed under him.   Pricilla Handler Mobility Specialist Mobility Specialist Phone: [phone]
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
## 2020-09-09 NOTE — Progress Notes (Signed)
CARDIAC REHAB PHASE I   PRE:  Rate/Rhythm: 62 SR with PACs    BP: lying 152/84    SaO2: 100 with breathing tx, 95 RA  MODE:  Ambulation: 180 ft   POST:  Rate/Rhythm: 132 afib?, occ p waves when slowed down    BP: sitting 169/85     SaO2: 95 RA  Pt still significantly SOB this am, at rest and with exertion. Just finished breathing treatment. Able to walk off O2 but sat and rested after 90 ft. HR up to 132 with exertion, quickly down with rest. Pt is not progressing with his breathing/SOB. To recliner to eat. Practiced IS, 750 mL sometimes.  Aldrich, ACSM 09/09/2020 9:16 AM

## 2020-09-09 NOTE — Progress Notes (Signed)
Progress Note  Patient Name: [name] Date of Encounter: 09/09/2020  [place] Cardiologist: No primary care provider on file.   Subjective   Still complaining of shortness of breath.  Otherwise doing fine.  Sitting up in a chair at the time of my evaluation.  Inpatient Medications    Scheduled Meds: . amLODipine  10 mg Oral Daily  . aspirin EC  81 mg Oral Daily  . budesonide (PULMICORT) nebulizer solution  1 mg Nebulization BID  . Chlorhexidine Gluconate Cloth  6 each Topical Daily  . clopidogrel  75 mg Oral Daily  . darbepoetin (ARANESP) injection - NON-DIALYSIS  150 mcg Subcutaneous Q Mon-1800  . docusate sodium  200 mg Oral Daily  . enoxaparin (LOVENOX) injection  30 mg Subcutaneous QHS  . feeding supplement  237 mL Oral TID BM  . furosemide  40 mg Intravenous BID  . insulin aspart  0-24 Units Subcutaneous TID AC & HS  . insulin detemir  15 Units Subcutaneous Daily  . levalbuterol  0.63 mg Nebulization TID  . multivitamin with minerals  1 tablet Oral Daily  . oxybutynin  5 mg Oral BID  . pantoprazole  40 mg Oral QAC breakfast  . rosuvastatin  40 mg Oral Daily  . sodium chloride flush  10-40 mL Intracatheter Q12H  . sodium chloride flush  3 mL Intravenous Q12H   Continuous Infusions: . sodium chloride     PRN Meds: sodium chloride, acetaminophen, alum & mag hydroxide-simeth, bisacodyl **OR** bisacodyl, ondansetron **OR** ondansetron (ZOFRAN) IV, sodium chloride flush, traMADol   Vital Signs    Vitals:   09/09/20 0813 09/09/20 0817 09/09/20 0844 09/09/20 1140  BP:   (!) 152/84 124/72  Pulse:   71 66  Resp:   (!) 28 18  Temp:   98.2 F (36.8 C) 98.5 F (36.9 C)  TempSrc:   Oral Oral  SpO2: 96% 96% 95% 97%  Weight:      Height:        Intake/Output Summary (Last 24 hours) at 09/09/2020 1309 Last data filed at 09/09/2020 0900 Gross per 24 hour  Intake 0 ml  Output 925 ml  Net -925 ml   Last 3 Weights 09/09/2020 09/08/2020 09/07/2020  Weight (lbs)  232 lb 1.6 oz 234 lb 3.2 oz 235 lb 2 oz  Weight (kg) 105.28 kg 106.232 kg 106.652 kg      Telemetry    Sinus rhythm with occasional AV Wenkebach- Personally Reviewed   Physical Exam  Alert, oriented, elderly male in NAD GEN: No acute distress.   Neck: No JVD Cardiac: RRR, no murmurs, rubs, or gallops.  Respiratory: rhonchi to auscultation bilaterally. GI: Soft, nontender, non-distended  MS: trace edema; No deformity. Neuro:  Nonfocal  Psych: Normal affect   Labs    High Sensitivity Troponin:   Recent Labs  Lab 08/23/20 0923 08/23/20 1058 08/23/20 1302 08/23/20 1500 08/23/20 1659  TROPONINIHS 7,414* 7,167* 7,228* 6,882* 6,979*      Chemistry Recent Labs  Lab 09/05/20 0425 09/05/20 0425 09/06/20 0433 09/07/20 0304 09/09/20 0136  NA 138   < > 140 141 143  K 4.2   < > 4.3 4.6 4.4  CL 105   < > 105 108 105  CO2 25   < > 25 23 27   GLUCOSE 104*   < > 120* 186* 162*  BUN 43*   < > 44* 40* 34*  CREATININE 2.71*   < > 2.37* 2.03* 1.89*  CALCIUM 7.9*   < >  8.1* 8.2* 8.7*  ALBUMIN 2.3*  --   --   --   --   GFRNONAA 23*   < > 27* 32* 35*  ANIONGAP 8   < > 10 10 11    < > = values in this interval not displayed.     Hematology Recent Labs  Lab 09/06/20 0433 09/07/20 0304 09/09/20 0136  WBC 7.2 6.2 7.2  RBC 3.02* 3.09* 3.27*  HGB 8.7* 9.0* 9.3*  HCT 27.9* 29.4* 31.1*  MCV 92.4 95.1 95.1  MCH 28.8 29.1 28.4  MCHC 31.2 30.6 29.9*  RDW 14.4 15.0 15.3  PLT 333 318 387    BNPNo results for input(s): BNP, PROBNP in the last 168 hours.   DDimer No results for input(s): DDIMER in the last 168 hours.   Radiology    No results found.   Patient Profile     85 y.o. male with VF arrest, severe three-vessel coronary artery disease, who underwent CABG August 29, 2020.  Assessment & Plan    1. NSTEMI/VF arrest/Severe 3 V CAD: steady progress post-CABG now POD #11 2. VF: occurred in setting of severe 3 vessel CAD, now with improved LV function  post-revascularization.  Medical therapy limited because of stage IV chronic kidney disease (no ACE/ARB/aldosterone antagonist) and bradycardia (no beta-blocker) 3. Post-op anemia: stable 4. Acute on chronic kidney injury - expected with CABG in the setting of baseline CKD. Now significantly improved, creatinine continuing to trend downward at 1.89 today. 5. Post-op volume overload -diuresis per surgical team, seems to be responding, weight down 1 kg from yesterday.  For questions or updates, please contact [place] Please consult [email or web address] for contact info under     Signed, Sherren Mocha, MD  09/09/2020, 1:09 PM

## 2020-09-09 NOTE — Progress Notes (Addendum)
[address]       [place] [address]             [phone]      11 Days Post-Op Procedure(s) (LRB): CORONARY ARTERY BYPASS GRAFTING (CABG) X 5 USING LEFT INTERNAL MAMMARY ARTERY AND ENDOSCOPICALLY HARVESTED RIGHT GREATER SAPHENOUS VEIN. LIMA TO LAD, SVG TO OM SEQ TO CIRC, SVG TO PD, SVG TO DIAG. (N/A) CLIPPING OF ATRIAL APPENDAGE USING ATRICURE 45 MM ATRICLIP FLEX-V (N/A) TRANSESOPHAGEAL ECHOCARDIOGRAM (TEE) (N/A) ENDOVEIN HARVEST OF GREATER SAPHENOUS VEIN (Right) Subjective: conts to make steady progress, C/O SOB, + wheeze this am  Objective: Vital signs in last 24 hours: Temp:  [98.1 F (36.7 C)-98.7 F (37.1 C)] 98.7 F (37.1 C) (11/02 0344) Pulse Rate:  [58-73] 67 (11/02 0344) Cardiac Rhythm: Ventricular tachycardia (11/01 2320) Resp:  [13-23] 22 (11/02 0344) BP: (132-184)/(65-89) 140/80 (11/02 0344) SpO2:  [91 %-100 %] 92 % (11/02 0344) Weight:  [105.3 kg] 105.3 kg (11/02 0624)  Hemodynamic parameters for last 24 hours:    Intake/Output from previous day: 11/01 0701 - 11/02 0700 In: 240 [P.O.:240] Out: 1750 [Urine:1750] Intake/Output this shift: No intake/output data recorded.  General appearance: alert, distracted, fatigued and no distress Heart: regular rate and rhythm Lungs: mild end exp wheeze in lower fields Abdomen: soft, non tender Extremities: minor edema Wound: incis healing well  Lab Results: Recent Labs    09/07/20 0304 09/09/20 0136  WBC 6.2 7.2  HGB 9.0* 9.3*  HCT 29.4* 31.1*  PLT 318 387   BMET:  Recent Labs    09/07/20 0304 09/09/20 0136  NA 141 143  K 4.6 4.4  CL 108 105  CO2 23 27  GLUCOSE 186* 162*  BUN 40* 34*  CREATININE 2.03* 1.89*  CALCIUM 8.2* 8.7*    PT/INR: No results for input(s): LABPROT, INR in the last 72 hours. ABG    Component Value Date/Time   PHART 7.345 (L) 08/30/2020 0209   HCO3 19.9 (L) 08/30/2020 0209   TCO2 21 (L) 08/30/2020 0209   ACIDBASEDEF 5.0 (H) 08/30/2020 0209    O2SAT 70.0 09/01/2020 0533   CBG (last 3)  Recent Labs    09/08/20 1647 09/08/20 2226 09/09/20 0618  GLUCAP 167* 152* 124*    Meds Scheduled Meds: . aspirin EC  81 mg Oral Daily  . budesonide (PULMICORT) nebulizer solution  1 mg Nebulization BID  . Chlorhexidine Gluconate Cloth  6 each Topical Daily  . clopidogrel  75 mg Oral Daily  . darbepoetin (ARANESP) injection - NON-DIALYSIS  150 mcg Subcutaneous Q Mon-1800  . docusate sodium  200 mg Oral Daily  . enoxaparin (LOVENOX) injection  30 mg Subcutaneous QHS  . feeding supplement  237 mL Oral TID BM  . insulin aspart  0-24 Units Subcutaneous TID AC & HS  . insulin detemir  15 Units Subcutaneous Daily  . multivitamin with minerals  1 tablet Oral Daily  . oxybutynin  5 mg Oral BID  . pantoprazole  40 mg Oral QAC breakfast  . rosuvastatin  40 mg Oral Daily  . sodium chloride flush  10-40 mL Intracatheter Q12H  . sodium chloride flush  3 mL Intravenous Q12H   Continuous Infusions: . sodium chloride     PRN Meds:.sodium chloride, acetaminophen, alum & mag hydroxide-simeth, bisacodyl **OR** bisacodyl, ondansetron **OR** ondansetron (ZOFRAN) IV, sodium chloride flush, traMADol  Xrays No results found.  Assessment/Plan: S/P Procedure(s) (LRB): CORONARY ARTERY BYPASS GRAFTING (CABG) X 5 USING  LEFT INTERNAL MAMMARY ARTERY AND ENDOSCOPICALLY HARVESTED RIGHT GREATER SAPHENOUS VEIN. LIMA TO LAD, SVG TO OM SEQ TO CIRC, SVG TO PD, SVG TO DIAG. (N/A) CLIPPING OF ATRIAL APPENDAGE USING ATRICURE 67 MM ATRICLIP FLEX-V (N/A) TRANSESOPHAGEAL ECHOCARDIOGRAM (TEE) (N/A) ENDOVEIN HARVEST OF GREATER SAPHENOUS VEIN (Right)  1 afeb, + HTN at times- will resume norvasc which he was on at home, had short episode of WCT- EP has been seeing  2 sats ok on RA but + SOB/Wheeze- will add xopenex 3 creat improved , received lasix yesterday, will repeat today 4 H/H improving 5 BS adeq control 6 hopefully home in am   LOS: 15 days    Duane Giovanni  PA-C Pager [phone] 09/09/2020  Lasix bid today Will need to make follow up in heart failure clinic - was followed in [facility] heart failure clinic preop but wants to come here  [place] home tomorrow I have seen and examined [name] and agree with the above assessment  and plan.  Grace Isaac MD Beeper [phone] Office [phone] 09/09/2020 8:47 AM

## 2020-09-10 DIAGNOSIS — I5023 Acute on chronic systolic (congestive) heart failure: Secondary | ICD-10-CM

## 2020-09-10 LAB — BASIC METABOLIC PANEL
Anion gap: 11 (ref 5–15)
BUN: 32 mg/dL — ABNORMAL HIGH (ref 8–23)
CO2: 26 mmol/L (ref 22–32)
Calcium: 8.5 mg/dL — ABNORMAL LOW (ref 8.9–10.3)
Chloride: 104 mmol/L (ref 98–111)
Creatinine, Ser: 1.87 mg/dL — ABNORMAL HIGH (ref 0.61–1.24)
GFR, Estimated: 35 mL/min — ABNORMAL LOW (ref >60)
Glucose, Bld: 100 mg/dL — ABNORMAL HIGH (ref 70–99)
Potassium: 4.1 mmol/L (ref 3.5–5.1)
Sodium: 141 mmol/L (ref 135–145)

## 2020-09-10 LAB — GLUCOSE, CAPILLARY
Glucose-Capillary: 97 mg/dL (ref 70–99)
Glucose-Capillary: 95 mg/dL (ref 70–99)

## 2020-09-10 MED ORDER — POTASSIUM CHLORIDE ER 10 MEQ PO TBCR: 10.0000 meq | EXTENDED_RELEASE_TABLET | Freq: Every day | ORAL | 1 refills | Status: DC

## 2020-09-10 MED ORDER — FUROSEMIDE 80 MG PO TABS
80.0000 mg | ORAL_TABLET | Freq: Every day | ORAL | Status: DC
  Administered 2020-09-10: 80 mg via ORAL
  Filled 2020-09-10: qty 1

## 2020-09-10 MED ORDER — FUROSEMIDE 80 MG PO TABS
80.0000 mg | ORAL_TABLET | Freq: Every day | ORAL | 1 refills | Status: DC
Start: 2020-09-10 — End: 2020-09-16

## 2020-09-10 MED ORDER — TRAMADOL HCL 50 MG PO TABS: 50.0000 mg | ORAL_TABLET | Freq: Two times a day (BID) | ORAL | 0 refills | Status: DC | PRN

## 2020-09-10 MED ORDER — INSULIN DETEMIR 100 UNIT/ML ~~LOC~~ SOLN: 20.0000 [IU] | Freq: Two times a day (BID) | SUBCUTANEOUS | 3 refills | Status: DC

## 2020-09-10 MED ORDER — ADULT MULTIVITAMIN W/MINERALS CH
1.0000 | ORAL_TABLET | Freq: Every day | ORAL | Status: DC
Start: 2020-09-11 — End: 2022-04-07

## 2020-09-10 MED ORDER — FUROSEMIDE 40 MG PO TABS
80.0000 mg | ORAL_TABLET | Freq: Every day | ORAL | 1 refills | Status: DC
Start: 2020-09-10 — End: 2020-09-10

## 2020-09-10 NOTE — Progress Notes (Addendum)
Progress Note  Patient Name: [name] Date of Encounter: 09/10/2020  [facility] Cardiologist: [name], MD   Subjective   Sitting up in bed. Hopeful to go home today. Says his breathing is much improved today.   Inpatient Medications    Scheduled Meds: . amLODipine  10 mg Oral Daily  . aspirin EC  81 mg Oral Daily  . budesonide (PULMICORT) nebulizer solution  1 mg Nebulization BID  . Chlorhexidine Gluconate Cloth  6 each Topical Daily  . clopidogrel  75 mg Oral Daily  . darbepoetin (ARANESP) injection - NON-DIALYSIS  150 mcg Subcutaneous Q Mon-1800  . docusate sodium  200 mg Oral Daily  . enoxaparin (LOVENOX) injection  30 mg Subcutaneous QHS  . feeding supplement  237 mL Oral TID BM  . insulin aspart  0-24 Units Subcutaneous TID AC & HS  . insulin detemir  15 Units Subcutaneous Daily  . levalbuterol  0.63 mg Nebulization BID  . multivitamin with minerals  1 tablet Oral Daily  . oxybutynin  5 mg Oral BID  . pantoprazole  40 mg Oral QAC breakfast  . rosuvastatin  40 mg Oral Daily  . sodium chloride flush  10-40 mL Intracatheter Q12H  . sodium chloride flush  3 mL Intravenous Q12H   Continuous Infusions: . sodium chloride     PRN Meds: sodium chloride, acetaminophen, alum & mag hydroxide-simeth, bisacodyl **OR** bisacodyl, levalbuterol, ondansetron **OR** ondansetron (ZOFRAN) IV, sodium chloride flush, traMADol   Vital Signs    Vitals:   09/10/20 0500 09/10/20 0723 09/10/20 0724 09/10/20 0824  BP:    137/75  Pulse:    65  Resp:    20  Temp:    98.3 F (36.8 C)  TempSrc:    Oral  SpO2:  93% 93% 91%  Weight: 103.4 kg     Height:        Intake/Output Summary (Last 24 hours) at 09/10/2020 0935 Last data filed at 09/10/2020 0410 Gross per 24 hour  Intake 250 ml  Output 1300 ml  Net -1050 ml   Last 3 Weights 09/10/2020 09/09/2020 09/08/2020  Weight (lbs) 228 lb 232 lb 1.6 oz 234 lb 3.2 oz  Weight (kg) 103.42 kg 105.28 kg 106.232 kg      Telemetry      SR, PACs - Personally Reviewed  ECG    No recent tracing  Physical Exam  Pleasant older male, sitting up in bed GEN: No acute distress.   Neck: No JVD Cardiac: RRR, no murmurs, rubs, or gallops.  Respiratory: diminished in bases GI: Soft, nontender, non-distended  MS: Trace LE edema bilaterally; No deformity. Neuro:  Nonfocal  Psych: Normal affect   Labs    High Sensitivity Troponin:   Recent Labs  Lab 08/23/20 0923 08/23/20 1058 08/23/20 1302 08/23/20 1500 08/23/20 1659  TROPONINIHS 7,414* 7,167* 7,228* 6,882* 6,979*      Chemistry Recent Labs  Lab 09/05/20 0425 09/06/20 0433 09/07/20 0304 09/09/20 0136 09/10/20 0102  NA 138   < > 141 143 141  K 4.2   < > 4.6 4.4 4.1  CL 105   < > 108 105 104  CO2 25   < > 23 27 26   GLUCOSE 104*   < > 186* 162* 100*  BUN 43*   < > 40* 34* 32*  CREATININE 2.71*   < > 2.03* 1.89* 1.87*  CALCIUM 7.9*   < > 8.2* 8.7* 8.5*  ALBUMIN 2.3*  --   --   --   --  GFRNONAA 23*   < > 32* 35* 35*  ANIONGAP 8   < > 10 11 11    < > = values in this interval not displayed.     Hematology Recent Labs  Lab 09/06/20 0433 09/07/20 0304 09/09/20 0136  WBC 7.2 6.2 7.2  RBC 3.02* 3.09* 3.27*  HGB 8.7* 9.0* 9.3*  HCT 27.9* 29.4* 31.1*  MCV 92.4 95.1 95.1  MCH 28.8 29.1 28.4  MCHC 31.2 30.6 29.9*  RDW 14.4 15.0 15.3  PLT 333 318 387    BNPNo results for input(s): BNP, PROBNP in the last 168 hours.   DDimer No results for input(s): DDIMER in the last 168 hours.   Radiology    No results found.  Cardiac Studies   Cath: 08/25/20   Prox RCA lesion is 50% stenosed.  Mid RCA lesion is 90% stenosed.  RPDA lesion is 50% stenosed.  Previously placed Prox Cx to Mid Cx stent (unknown type) is widely patent.  Mid Cx to Dist Cx lesion is 85% stenosed.  2nd Mrg lesion is 60% stenosed.  Prox LAD lesion is 99% stenosed.  Mid LAD lesion is 10% stenosed.  1st Diag-2 lesion is 10% stenosed.  1st Diag-1 lesion is 90%  stenosed.   1.  Severe three-vessel coronary artery disease with complex bifurcation stenosis in the proximal LAD at the origin of a large diagonal branch that supplies most of the anterolateral territory.  The diagonal also has severe ostial stenosis.  Patent stents in the mid LAD, mid first diagonal and mid left circumflex. 2.  Left ventricular angiography was not performed due to chronic kidney disease and recent acute on chronic renal failure.  EF was mildly reduced by echo. 3.  Moderately elevated left ventricular end-diastolic pressure at 24 mmHg.  Diagnostic Dominance: Right  Echo: 09/06/20  IMPRESSIONS    1. There is slight right to left bowing of the right ventricular septum  seen in the 4 chamber view that may be indicative of elevated right sided  pressure/volume overload. Left ventricular ejection fraction, by  estimation, is 50 to 55%. The left  ventricle has low normal function. The left ventricle has no regional wall  motion abnormalities. There is mild left ventricular hypertrophy. Left  ventricular diastolic parameters are consistent with Grade I diastolic  dysfunction (impaired relaxation).  2. Right ventricular systolic function is normal. The right ventricular  size is normal.  3. Left atrial size was mildly dilated.  4. The mitral valve is normal in structure. No evidence of mitral valve  regurgitation. No evidence of mitral stenosis.  5. The aortic valve is normal in structure. Aortic valve regurgitation is  not visualized. No aortic stenosis is present.  6. The inferior vena cava is normal in size with greater than 50%  respiratory variability, suggesting right atrial pressure of 3 mmHg.   Comparison(s): Prior images reviewed side by side. The left ventricular  function has improved.   Patient Profile     85 y.o. male with VF arrest, severe three-vessel coronary artery disease, who underwent CABG August 29, 2020.  Assessment & Plan    1. NSTEMI  with Vfib arrest: cath with 3v disease now s/p CABG. Slow but steady progress. EF of 50-55% on echo with G1DD (09/06/20) Hopeful to discharge home today. Medical therapy has been limited by CKD and bradycardia -- ASA, plavix, statin  2. VF: in the setting of 3v disease now revascularized s/p CABG. Unable to resume home medications including Entresto/spiro in  the setting of CKD. BB therapy not started in the setting of bradycardia.   3. Acute on Chronic CKD: Cr peaked at 4.03, now trended down to 1.87.   4. Post op anemia: remains stable around 9.   5. Post op volume overload: has received intermittent IV lasix, currently Net - 1.5L, weight is down to 228 today. LE edema has improved.  -- Will need a daily diuretic at discharge. Was on 40mg  lasix daily prior to admission, suspect this will be sufficient at the time of discharge. Can further adjust at follow up if needed pending renal function.   CARDIOLOGY RECOMMENDATIONS:  Discharge is anticipated in the next 48 hours. Recommendations for medications and follow up:  Discharge Medications: Continue medications as they are currently listed in the [facility]. Exceptions to the above:  Lasix 80mg  daily  Follow Up: The patient's Primary Cardiologist is [name], MD  Follow up in the office in 1 week(s). This has been arranged for 11/8  Signed,  Reino Bellis, NP  9:35 AM 09/10/2020  [facility]  Patient seen, examined. Available data reviewed. Agree with findings, assessment, and plan as outlined by Reino Bellis, NP.  The patient is independently interviewed and examined.  He is an alert, oriented, elderly male in no distress.  JVP is normal, lungs are diminished in the bases with scattered rhonchi but otherwise clear, heart is regular rate and rhythm with no murmur gallop, abdomen is soft and nontender, extremities have trace edema.  Telemetry is reviewed and shows sinus rhythm/sinus bradycardia with occasional PACs with block.   The patient is clinically improved.  I reviewed his medications and would increase his oral Lasix dose to 80 mg once daily at discharge.  He is set up for a follow-up appointment in our [place] office next week and we will make sure to draw metabolic panel at that time.  We can get him established in the heart failure clinic for ongoing follow-up but will leave the transition of care visit for next week.  I do not think he should be started on an ACE/ARB/aldosterone antagonist because of stage IV chronic kidney disease.  No beta-blocker because of bradycardia and periods of [place] 1 block.  Overall much improved now maintaining oxygen saturations over 90% on room air.  Sherren Mocha, M.D. 09/10/2020 10:01 AM

## 2020-09-10 NOTE — Progress Notes (Addendum)
[address]       [place] [address]             [phone]      12 Days Post-Op Procedure(s) (LRB): CORONARY ARTERY BYPASS GRAFTING (CABG) X 5 USING LEFT INTERNAL MAMMARY ARTERY AND ENDOSCOPICALLY HARVESTED RIGHT GREATER SAPHENOUS VEIN. LIMA TO LAD, SVG TO OM SEQ TO CIRC, SVG TO PD, SVG TO DIAG. (N/A) CLIPPING OF ATRIAL APPENDAGE USING ATRICURE 45 MM ATRICLIP FLEX-V (N/A) TRANSESOPHAGEAL ECHOCARDIOGRAM (TEE) (N/A) ENDOVEIN HARVEST OF GREATER SAPHENOUS VEIN (Right) Subjective: Breathing is more comfortable today, no wheezing  Objective: Vital signs in last 24 hours: Temp:  [98.1 F (36.7 C)-98.5 F (36.9 C)] 98.2 F (36.8 C) (11/03 0400) Pulse Rate:  [66-75] 68 (11/03 0012) Cardiac Rhythm: Normal sinus rhythm (11/02 1900) Resp:  [14-28] 14 (11/03 0400) BP: (124-152)/(67-84) 136/75 (11/03 0400) SpO2:  [92 %-98 %] 93 % (11/03 0724) Weight:  [103.4 kg] 103.4 kg (11/03 0500)  Hemodynamic parameters for last 24 hours:    Intake/Output from previous day: 11/02 0701 - 11/03 0700 In: 250 [P.O.:240; I.V.:10] Out: 1400 [Urine:1400] Intake/Output this shift: No intake/output data recorded.  General appearance: alert, cooperative and no distress Heart: regular rate and rhythm Lungs: clear to auscultation bilaterally Abdomen: benign Extremities: minor edema Wound: incis healing well  Lab Results: Recent Labs    09/09/20 0136  WBC 7.2  HGB 9.3*  HCT 31.1*  PLT 387   BMET:  Recent Labs    09/09/20 0136 09/10/20 0102  NA 143 141  K 4.4 4.1  CL 105 104  CO2 27 26  GLUCOSE 162* 100*  BUN 34* 32*  CREATININE 1.89* 1.87*  CALCIUM 8.7* 8.5*    PT/INR: No results for input(s): LABPROT, INR in the last 72 hours. ABG    Component Value Date/Time   PHART 7.345 (L) 08/30/2020 0209   HCO3 19.9 (L) 08/30/2020 0209   TCO2 21 (L) 08/30/2020 0209   ACIDBASEDEF 5.0 (H) 08/30/2020 0209   O2SAT 70.0 09/01/2020 0533   CBG (last 3)  Recent Labs     09/09/20 1637 09/09/20 2109 09/10/20 0618  GLUCAP 138* 162* 97    Meds Scheduled Meds: . amLODipine  10 mg Oral Daily  . aspirin EC  81 mg Oral Daily  . budesonide (PULMICORT) nebulizer solution  1 mg Nebulization BID  . Chlorhexidine Gluconate Cloth  6 each Topical Daily  . clopidogrel  75 mg Oral Daily  . darbepoetin (ARANESP) injection - NON-DIALYSIS  150 mcg Subcutaneous Q Mon-1800  . docusate sodium  200 mg Oral Daily  . enoxaparin (LOVENOX) injection  30 mg Subcutaneous QHS  . feeding supplement  237 mL Oral TID BM  . insulin aspart  0-24 Units Subcutaneous TID AC & HS  . insulin detemir  15 Units Subcutaneous Daily  . levalbuterol  0.63 mg Nebulization BID  . multivitamin with minerals  1 tablet Oral Daily  . oxybutynin  5 mg Oral BID  . pantoprazole  40 mg Oral QAC breakfast  . rosuvastatin  40 mg Oral Daily  . sodium chloride flush  10-40 mL Intracatheter Q12H  . sodium chloride flush  3 mL Intravenous Q12H   Continuous Infusions: . sodium chloride     PRN Meds:.sodium chloride, acetaminophen, alum & mag hydroxide-simeth, bisacodyl **OR** bisacodyl, levalbuterol, ondansetron **OR** ondansetron (ZOFRAN) IV, sodium chloride flush, traMADol  Xrays No results found.  Assessment/Plan: S/P Procedure(s) (LRB): CORONARY ARTERY BYPASS GRAFTING (CABG)  X 5 USING LEFT INTERNAL MAMMARY ARTERY AND ENDOSCOPICALLY HARVESTED RIGHT GREATER SAPHENOUS VEIN. LIMA TO LAD, SVG TO OM SEQ TO CIRC, SVG TO PD, SVG TO DIAG. (N/A) CLIPPING OF ATRIAL APPENDAGE USING ATRICURE 45 MM ATRICLIP FLEX-V (N/A) TRANSESOPHAGEAL ECHOCARDIOGRAM (TEE) (N/A) ENDOVEIN HARVEST OF GREATER SAPHENOUS VEIN (Right)  1 conts to diurese , Creat stable at 1.87 today 2 afeb, BP control a little better on norvasc 3 sats ok on 2 liters/RA alternating at times 4 poss home today, will d/w with MD home diuretic plans  LOS: 16 days    Duane Giovanni PA-C Pager [phone] 09/10/2020  will review with HF/Cardiology  team home meds compared to what he  was taking on admission I have seen and examined [name] and agree with the above assessment  and plan.  Grace Isaac MD Beeper [phone] Office [phone] 09/10/2020 8:38 AM

## 2020-09-10 NOTE — Progress Notes (Signed)
Discharge instructions given to patient. IV removed, clean and intact. Medications and wound care reviewed, all questions answered.  Pt escorted home with wife.   Arletta Bale, RN

## 2020-09-10 NOTE — Progress Notes (Signed)
Discussed sternal precautions, IS, exercise, diet, and CRPII with pt and wife. Receptive. Will refer to [facility]. Has rollator in room. [place] 12:35 PM 09/10/2020

## 2020-09-10 NOTE — TOC Transition Note (Signed)
Transition of Care (TOC) - CM/SW Discharge Note Marvetta Gibbons RN, BSN Transitions of Care Unit 4E- RN Case Manager See Treatment Team for direct phone #    Patient Details  Name: [name] MRN: [id number] Date of Birth: 03-15-1935  Transition of Care [facility]) CM/SW Contact:  Dawayne Patricia, RN Phone Number: 09/10/2020, 9:54 AM   Clinical Narrative:    Pt stable for transition home today,  HH has been set up with [facility] as per previous CM notes- have spoken with Britney at [facility] and confirmed referral has been accepted and they will f/u with contacting pt for start of care at home.  Cm spoke with pt at bedside to confirm transition plans, pt remains agreeable. Orders have been placed for [facility] and DME needs.  Call made on 11/2 to [employer] for DME - rollator need- rollator has been delivered to room for discharge.   No further TOC needs noted.   Final next level of care: [place] Barriers to Discharge: Barriers Resolved   Patient Goals and CMS Choice Patient states their goals for this hospitalization and ongoing recovery are:: get better CMS Medicare.gov Compare Post Acute Care list provided to:: Patient Choice offered to / list presented to : Patient  Discharge Placement                Home with [facility]      Discharge Plan and Services In-house Referral: NA Discharge Planning Services: CM Consult Post Acute Care Choice: Home Health          DME Arranged: [place] rolling with seat DME Agency: [employer] Date DME Agency Contacted: 09/09/20 Time DME Agency Contacted: 78 Representative spoke with at DME Agency: Adela Lank HH Arranged: PT, OT [place] Agency: [facility] Date [place]: 09/08/20 Time [place]: [id number] Representative spoke with at [place]: [place] (SDOH) Interventions Food Insecurity Interventions: Intervention Not Indicated Transportation Interventions: Intervention Not  Indicated   Readmission Risk Interventions Readmission Risk Prevention Plan 09/10/2020 08/28/2020  Transportation Screening - Complete  HRI or [place] - Complete  Social Work Consult for [place] Planning/Counseling - Complete  Palliative Care Screening - Not Applicable  Medication Review Press photographer) - Complete  PCP or Specialist appointment within 3-5 days of discharge Complete -  [place] or Home Care Consult Complete -  SW Recovery Care/Counseling Consult Complete -  Palliative Care Screening Not Applicable -  [place] Not Applicable -  Some recent data might be hidden

## 2020-09-11 ENCOUNTER — Telehealth: Payer: Self-pay | Admitting: Cardiovascular Disease

## 2020-09-11 NOTE — Telephone Encounter (Signed)
Noted  

## 2020-09-11 NOTE — Telephone Encounter (Signed)
Per Home Health care starting Saturday 11/6   Just fyi
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
## 2020-09-15 ENCOUNTER — Encounter: Payer: Self-pay | Admitting: Family

## 2020-09-15 ENCOUNTER — Ambulatory Visit: Payer: Medicare HMO | Admitting: Family

## 2020-09-15 ENCOUNTER — Other Ambulatory Visit: Payer: Self-pay

## 2020-09-15 VITALS — BP 132/64 | HR 65 | Ht 68.0 in | Wt 230.0 lb

## 2020-09-15 DIAGNOSIS — Z951 Presence of aortocoronary bypass graft: Secondary | ICD-10-CM | POA: Diagnosis not present

## 2020-09-15 DIAGNOSIS — I25118 Atherosclerotic heart disease of native coronary artery with other forms of angina pectoris: Secondary | ICD-10-CM | POA: Diagnosis not present

## 2020-09-15 DIAGNOSIS — E785 Hyperlipidemia, unspecified: Secondary | ICD-10-CM

## 2020-09-15 DIAGNOSIS — I10 Essential (primary) hypertension: Secondary | ICD-10-CM

## 2020-09-15 DIAGNOSIS — N1831 Chronic kidney disease, stage 3a: Secondary | ICD-10-CM

## 2020-09-15 MED ORDER — SPIRONOLACTONE 50 MG PO TABS: 50.0000 mg | ORAL_TABLET | Freq: Every day | ORAL | 1 refills | Status: DC

## 2020-09-15 MED ORDER — MAGNESIUM OXIDE 400 MG PO TABS: 400.0000 mg | ORAL_TABLET | Freq: Every day | ORAL | 1 refills | Status: DC

## 2020-09-15 MED ORDER — AMLODIPINE BESYLATE 10 MG PO TABS: 10.0000 mg | ORAL_TABLET | Freq: Every day | ORAL | 1 refills | Status: DC

## 2020-09-15 MED ORDER — CLOPIDOGREL BISULFATE 75 MG PO TABS: 75.0000 mg | ORAL_TABLET | Freq: Every day | ORAL | 1 refills | Status: DC

## 2020-09-15 MED ORDER — ROSUVASTATIN CALCIUM 40 MG PO TABS: 40.0000 mg | ORAL_TABLET | Freq: Every day | ORAL | 1 refills | Status: DC

## 2020-09-15 NOTE — Patient Instructions (Addendum)
Medication Instructions:  Your physician recommends that you continue on your current medications as directed. Please refer to the Current Medication list given to you today.  *If you need a refill on your cardiac medications before your next appointment, please call your pharmacy*   Lab Work: Your physician recommends that you return for lab work in: [place]  If you have labs (blood work) drawn today and your tests are completely normal, you will receive your results only by: Marland Kitchen MyChart Message (if you have MyChart) OR . A paper copy in the mail If you have any lab test that is abnormal or we need to change your treatment, we will call you to review the results.   Testing/Procedures: None ordered.   Follow-Up: At [facility], you and your health needs are our priority.  As part of our continuing mission to provide you with exceptional heart care, we have created designated Provider Care Teams.  These Care Teams include your primary Cardiologist (physician) and Advanced Practice Providers (APPs -  Physician Assistants and Nurse Practitioners) who all work together to provide you with the care you need, when you need it.  We recommend signing up for the patient portal called "MyChart".  Sign up information is provided on this After Visit Summary.  MyChart is used to connect with patients for Virtual Visits (Telemedicine).  Patients are able to view lab/test results, encounter notes, upcoming appointments, etc.  Non-urgent messages can be sent to your provider as well.   To learn more about what you can do with MyChart, go to [email or web address].    Your next appointment:   6 week(s)  The format for your next appointment:   In Person  Provider:   You may see [name], MD or one of the following Advanced Practice Providers on your designated Care Team:    Murray Hodgkins, NP  Christell Faith, PA-C  Marrianne Mood, PA-C  Cadence Kathlen Mody, [place]      You have  been referred to cardiac rehab. This is a combination program including monitored exercise, dietary education, and support group. We strongly recommend participating in the program. Expect a phone call from them in approximately 2-3 weeks. If you do not hear from them, the phone number for cardiac rehab at [facility] is [phone].   Continue to use your incentive spirometer at home (this is the blue machine you use to improve your breathing).   Recommend purchasing an arm BP cuff for home monitoring of BP.   Heart-Healthy Eating Plan Heart-healthy meal planning includes:  Eating less unhealthy fats.  Eating more healthy fats.  Making other changes in your diet. Talk with your doctor or a diet specialist (dietitian) to create an eating plan that is right for you. What are tips for following this plan? Cooking Avoid frying your food. Try to bake, boil, grill, or broil it instead. You can also reduce fat by:  Removing the skin from poultry.  Removing all visible fats from meats.  Steaming vegetables in water or broth. Meal planning   At meals, divide your plate into four equal parts: ? Fill one-half of your plate with vegetables and green salads. ? Fill one-fourth of your plate with whole grains. ? Fill one-fourth of your plate with lean protein foods.  Eat 4-5 servings of vegetables per day. A serving of vegetables is: ? 1 cup of raw or cooked vegetables. ? 2 cups of raw leafy greens.  Eat 4-5 servings of fruit per  day. A serving of fruit is: ? 1 medium whole fruit. ?  cup of dried fruit. ?  cup of fresh, frozen, or canned fruit. ?  cup of 100% fruit juice.  Eat more foods that have soluble fiber. These are apples, broccoli, carrots, beans, peas, and barley. Try to get 20-30 g of fiber per day.  Eat 4-5 servings of nuts, legumes, and seeds per week: ? 1 serving of dried beans or legumes equals  cup after being cooked. ? 1 serving of nuts is  cup. ? 1 serving of seeds  equals 1 tablespoon. General information  Eat more home-cooked food. Eat less restaurant, buffet, and fast food.  Limit or avoid alcohol.  Limit foods that are high in starch and sugar.  Avoid fried foods.  Lose weight if you are overweight.  Keep track of how much salt (sodium) you eat. This is important if you have high blood pressure. Ask your doctor to tell you more about this.  Try to add vegetarian meals each week. Fats  Choose healthy fats. These include olive oil and canola oil, flaxseeds, walnuts, almonds, and seeds.  Eat more omega-3 fats. These include salmon, mackerel, sardines, tuna, flaxseed oil, and ground flaxseeds. Try to eat fish at least 2 times each week.  Check food labels. Avoid foods with trans fats or high amounts of saturated fat.  Limit saturated fats. ? These are often found in animal products, such as meats, butter, and cream. ? These are also found in plant foods, such as palm oil, palm kernel oil, and coconut oil.  Avoid foods with partially hydrogenated oils in them. These have trans fats. Examples are stick margarine, some tub margarines, cookies, crackers, and other baked goods. What foods can I eat? Fruits All fresh, canned (in natural juice), or frozen fruits. Vegetables Fresh or frozen vegetables (raw, steamed, roasted, or grilled). Green salads. Grains Most grains. Choose whole wheat and whole grains most of the time. Rice and pasta, including brown rice and pastas made with whole wheat. Meats and other proteins Lean, well-trimmed beef, veal, pork, and lamb. Chicken and [place] without skin. All fish and shellfish. Wild duck, rabbit, pheasant, and venison. Egg whites or low-cholesterol egg substitutes. Dried beans, peas, lentils, and tofu. Seeds and most nuts. Dairy Low-fat or nonfat cheeses, including ricotta and mozzarella. Skim or 1% milk that is liquid, powdered, or evaporated. Buttermilk that is made with low-fat milk. Nonfat or low-fat  yogurt. Fats and oils Non-hydrogenated (trans-free) margarines. Vegetable oils, including soybean, sesame, sunflower, olive, peanut, safflower, corn, canola, and cottonseed. Salad dressings or mayonnaise made with a vegetable oil. Beverages Mineral water. Coffee and tea. Diet carbonated beverages. Sweets and desserts Sherbet, gelatin, and fruit ice. Small amounts of dark chocolate. Limit all sweets and desserts. Seasonings and condiments All seasonings and condiments. The items listed above may not be a complete list of foods and drinks you can eat. Contact a dietitian for more options. What foods should I avoid? Fruits Canned fruit in heavy syrup. Fruit in cream or butter sauce. Fried fruit. Limit coconut. Vegetables Vegetables cooked in cheese, cream, or butter sauce. Fried vegetables. Grains Breads that are made with saturated or trans fats, oils, or whole milk. Croissants. Sweet rolls. Donuts. High-fat crackers, such as cheese crackers. Meats and other proteins Fatty meats, such as hot dogs, ribs, sausage, bacon, rib-eye roast or steak. High-fat deli meats, such as salami and bologna. Caviar. Domestic duck and goose. Organ meats, such as liver. Dairy Cream,  sour cream, cream cheese, and creamed cottage cheese. Whole-milk cheeses. Whole or 2% milk that is liquid, evaporated, or condensed. Whole buttermilk. Cream sauce or high-fat cheese sauce. Yogurt that is made from whole milk. Fats and oils Meat fat, or shortening. Cocoa butter, hydrogenated oils, palm oil, coconut oil, palm kernel oil. Solid fats and shortenings, including bacon fat, salt pork, lard, and butter. Nondairy cream substitutes. Salad dressings with cheese or sour cream. Beverages Regular sodas and juice drinks with added sugar. Sweets and desserts Frosting. Pudding. Cookies. Cakes. Pies. Milk chocolate or white chocolate. Buttered syrups. Full-fat ice cream or ice cream drinks. The items listed above may not be a  complete list of foods and drinks to avoid. Contact a dietitian for more information. Summary  Heart-healthy meal planning includes eating less unhealthy fats, eating more healthy fats, and making other changes in your diet.  Eat a balanced diet. This includes fruits and vegetables, low-fat or nonfat dairy, lean protein, nuts and legumes, whole grains, and heart-healthy oils and fats. This information is not intended to replace advice given to you by your health care provider. Make sure you discuss any questions you have with your health care provider. Document Revised: 12/29/2017 Document Reviewed: 12/02/2017 Elsevier Patient Education  2020 [employer].

## 2020-09-16 ENCOUNTER — Other Ambulatory Visit: Payer: Self-pay | Admitting: Family

## 2020-09-16 ENCOUNTER — Telehealth: Payer: Self-pay

## 2020-09-16 LAB — CBC
Hematocrit: 33.2 % — ABNORMAL LOW (ref 37.5–51.0)
Hemoglobin: 10.7 g/dL — ABNORMAL LOW (ref 13.0–17.7)
MCH: 28.9 pg (ref 26.6–33.0)
MCHC: 32.2 g/dL (ref 31.5–35.7)
MCV: 90 fL (ref 79–97)
Platelets: 360 10*3/uL (ref 150–450)
RBC: 3.7 x10E6/uL — ABNORMAL LOW (ref 4.14–5.80)
RDW: 14.8 % (ref 11.6–15.4)
WBC: 6.3 10*3/uL (ref 3.4–10.8)

## 2020-09-16 LAB — BASIC METABOLIC PANEL
BUN/Creatinine Ratio: 11 (ref 10–24)
BUN: 19 mg/dL (ref 8–27)
CO2: 26 mmol/L (ref 20–29)
Calcium: 8.4 mg/dL — ABNORMAL LOW (ref 8.6–10.2)
Chloride: 104 mmol/L (ref 96–106)
Creatinine, Ser: 1.69 mg/dL — ABNORMAL HIGH (ref 0.76–1.27)
GFR calc Af Amer: 42 mL/min/{1.73_m2} — ABNORMAL LOW (ref >59)
GFR calc non Af Amer: 37 mL/min/{1.73_m2} — ABNORMAL LOW (ref >59)
Glucose: 97 mg/dL (ref 65–99)
Potassium: 4.8 mmol/L (ref 3.5–5.2)
Sodium: 142 mmol/L (ref 134–144)

## 2020-09-16 MED ORDER — POTASSIUM CHLORIDE ER 10 MEQ PO TBCR: 10.0000 meq | EXTENDED_RELEASE_TABLET | Freq: Every day | ORAL | 5 refills | Status: DC

## 2020-09-16 MED ORDER — FUROSEMIDE 80 MG PO TABS: 80.0000 mg | ORAL_TABLET | Freq: Every day | ORAL | 5 refills | Status: DC

## 2020-09-16 NOTE — Telephone Encounter (Signed)
-----  Message from Loel Dubonnet, NP sent at 09/16/2020  7:39 AM EST ----- Kidney function continues to improve. Calcium stable. Potassium normal. Continue medications at current doses. I have sent refill of Lasix and Potassium, all other cardiac meds were refilled yesterday.  Triage team - Can we please get him set up for 'follow up of VF' with Dr. Quentin Ore in January? In clinic I mentioned to him and his wife that Dr. Quentin Ore wanted to follow up (they met in hospital) but wasn't sure when and Dr. Quentin Ore recommended anytime in January.

## 2020-09-16 NOTE — Telephone Encounter (Signed)
Attempted to contact the patient with lab results. Home telephone rings out with no voicemail option. Mobile number listed is d/c.  Patient will also need to be scheduled for a Jan 2022 appt w/Dr. Quentin Ore.

## 2020-09-17 ENCOUNTER — Telehealth: Payer: Self-pay | Admitting: *Deleted

## 2020-09-17 NOTE — Telephone Encounter (Signed)
Pt's daughter called with concerns over what foods [name] can and can not eat. Pt advised not to eat foods listed on discharge educational material. Pt encouraged to eat a heart healthy diet limiting the intake of sodium and highly processed food. Pt's daughter acknowledges understanding.

## 2020-09-17 NOTE — Telephone Encounter (Signed)
Attempted to contact the patient. No answer at the home # - I left a message to please call back.  No answer/ no voice mail on the cell #- unable to leave a message.

## 2020-09-18 NOTE — Telephone Encounter (Signed)
The patient has been notified of the result and verbalized understanding.  All questions (if any) were answered. [place], RN 09/18/2020 10:03 AM     Transferred to scheduler to have patient scheduled with Dr Quentin Ore in January 2022.

## 2020-09-30 ENCOUNTER — Telehealth: Payer: Self-pay | Admitting: Cardiovascular Disease

## 2020-09-30 NOTE — Telephone Encounter (Addendum)
Called the pt. lmtcb x2 home and cell.

## 2020-09-30 NOTE — Telephone Encounter (Signed)
Therapist with [facility] states patient has had a headache, soreness in upper right shoulder, back , and neck . States this has been increasing over the last week or so. Please call to discuss.

## 2020-10-07 NOTE — Telephone Encounter (Signed)
Spoke with Horris Latino at [facility] and reviewed message from previous call. Wife wanted [facility] to call us about these symptoms. Blood pressures are improving from initial visit. Advised that they should reach out to his primary care provider since his blood pressures are getting better and they can assist with evaluation of headache. She verbalized understanding of conversation, was agreeable to contact PCP, and had no further questions at this time.   Blood pressures: 155/72 148/75 138/70

## 2020-10-08 ENCOUNTER — Other Ambulatory Visit: Payer: Self-pay | Admitting: Cardiothoracic Surgery

## 2020-10-08 DIAGNOSIS — Z951 Presence of aortocoronary bypass graft: Secondary | ICD-10-CM
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
## 2020-10-09 ENCOUNTER — Other Ambulatory Visit: Payer: Self-pay

## 2020-10-09 ENCOUNTER — Ambulatory Visit
Admission: RE | Admit: 2020-10-09 | Discharge: 2020-10-09 | Disposition: A | Discharge status: Home / Self Care | Payer: Medicare HMO | Source: Ambulatory Visit | Attending: Cardiothoracic Surgery | Admitting: Cardiothoracic Surgery

## 2020-10-09 ENCOUNTER — Ambulatory Visit (INDEPENDENT_AMBULATORY_CARE_PROVIDER_SITE_OTHER): Payer: Self-pay | Admitting: Cardiothoracic Surgery

## 2020-10-09 VITALS — BP 139/65 | HR 64 | Temp 98.1°F | Resp 20 | Ht 68.0 in | Wt 214.0 lb

## 2020-10-09 DIAGNOSIS — I2510 Atherosclerotic heart disease of native coronary artery without angina pectoris: Secondary | ICD-10-CM

## 2020-10-09 DIAGNOSIS — Z951 Presence of aortocoronary bypass graft: Secondary | ICD-10-CM

## 2020-10-27 ENCOUNTER — Encounter: Payer: Self-pay | Admitting: Family

## 2020-10-27 ENCOUNTER — Other Ambulatory Visit: Payer: Self-pay

## 2020-10-27 ENCOUNTER — Ambulatory Visit (INDEPENDENT_AMBULATORY_CARE_PROVIDER_SITE_OTHER): Payer: Medicare HMO | Admitting: Family

## 2020-10-27 VITALS — BP 130/76 | HR 60 | Ht 68.0 in | Wt 222.5 lb

## 2020-10-27 DIAGNOSIS — I5020 Unspecified systolic (congestive) heart failure: Secondary | ICD-10-CM

## 2020-10-27 DIAGNOSIS — E785 Hyperlipidemia, unspecified: Secondary | ICD-10-CM | POA: Diagnosis not present

## 2020-10-27 DIAGNOSIS — I25118 Atherosclerotic heart disease of native coronary artery with other forms of angina pectoris: Secondary | ICD-10-CM | POA: Diagnosis not present

## 2020-10-27 DIAGNOSIS — N1831 Chronic kidney disease, stage 3a: Secondary | ICD-10-CM

## 2020-10-27 DIAGNOSIS — Z951 Presence of aortocoronary bypass graft: Secondary | ICD-10-CM

## 2020-10-27 NOTE — Patient Instructions (Addendum)
Medication Instructions:  No medication changes today.   Please check if you are taking Spironolactone at home - this medicine is on your medicine list but you weren't sure you were taking it. We sent a refill at your last office visit so I anticipate you are taking it. Call us to let us to know if you are taking it.   *If you need a refill on your cardiac medications before your next appointment, please call your pharmacy*   Lab Work: No lab work today.  Testing/Procedures: Your EKG today shows normal sinus rhythm. You have an occasional early beat called a PVC which is a common finding and not dangerous.    Follow-Up: At [facility], you and your health needs are our priority.  As part of our continuing mission to provide you with exceptional heart care, we have created designated Provider Care Teams.  These Care Teams include your primary Cardiologist (physician) and Advanced Practice Providers (APPs -  Physician Assistants and Nurse Practitioners) who all work together to provide you with the care you need, when you need it.  We recommend signing up for the patient portal called "MyChart".  Sign up information is provided on this After Visit Summary.  MyChart is used to connect with patients for Virtual Visits (Telemedicine).  Patients are able to view lab/test results, encounter notes, upcoming appointments, etc.  Non-urgent messages can be sent to your provider as well.   To learn more about what you can do with MyChart, go to [email or web address].    Your next appointment:    In January with Dr. Quentin Ore as scheduled  AND   In 4 months with Dr. Fletcher Anon or APP   Other Instructions  Recommend using Tylenol or trying a heat pack for your neck pain. You can make your own by putting dry rice in a sock, tying the end in a knot, and microwaving for 1 minute.  You may use Muccinex as needed for cough.
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
## 2020-11-04 ENCOUNTER — Telehealth: Payer: Self-pay

## 2020-11-04 NOTE — Telephone Encounter (Signed)
Patient calling - patient states at his last office visit he was suppose to call back and let us know if he was taking  Spironolactone.   He states he did look and he does have that medication.  He wanted to jsut make [place] aware.

## 2020-11-04 NOTE — Telephone Encounter (Signed)
Thank you for update. He should continue at current dose. No changes at this time.   At clinic visit he wasn't certain if he was taking, but we presumed he was.   Alver Sorrow, NP

## 2020-11-04 NOTE — Telephone Encounter (Signed)
Spoke to pt he verified that he is taking Spironolactone 50mg  daily as med list indicates. Will make , NP aware. Last saw in office 10/27/20.

## 2020-11-12 ENCOUNTER — Encounter: Payer: Self-pay | Admitting: Cardiology

## 2020-11-12 ENCOUNTER — Ambulatory Visit (INDEPENDENT_AMBULATORY_CARE_PROVIDER_SITE_OTHER): Payer: Medicare HMO | Admitting: Cardiology

## 2020-11-12 ENCOUNTER — Other Ambulatory Visit: Payer: Self-pay

## 2020-11-12 VITALS — BP 124/70 | HR 45 | Ht 68.0 in | Wt 219.0 lb

## 2020-11-12 DIAGNOSIS — I5020 Unspecified systolic (congestive) heart failure: Secondary | ICD-10-CM

## 2020-11-12 DIAGNOSIS — I10 Essential (primary) hypertension: Secondary | ICD-10-CM | POA: Diagnosis not present

## 2020-11-12 DIAGNOSIS — I25118 Atherosclerotic heart disease of native coronary artery with other forms of angina pectoris: Secondary | ICD-10-CM

## 2020-11-12 DIAGNOSIS — I469 Cardiac arrest, cause unspecified: Secondary | ICD-10-CM | POA: Diagnosis not present

## 2020-11-12 NOTE — Patient Instructions (Signed)
Medication Instructions:  - Your physician recommends that you continue on your current medications as directed. Please refer to the Current Medication list given to you today.  *If you need a refill on your cardiac medications before your next appointment, please call your pharmacy*   Lab Work: - none ordered  If you have labs (blood work) drawn today and your tests are completely normal, you will receive your results only by: Marland Kitchen MyChart Message (if you have MyChart) OR . A paper copy in the mail If you have any lab test that is abnormal or we need to change your treatment, we will call you to review the results.   Testing/Procedures: - Your physician has requested that you have an echocardiogram. Echocardiography is a painless test that uses sound waves to create images of your heart. It provides your doctor with information about the size and shape of your heart and how well your heart's chambers and valves are working. This procedure takes approximately one hour. There are no restrictions for this procedure. There is a possibility that an IV may need to be started during your test to inject an image enhancing agent. This is done to obtain more optimal pictures of your heart. Therefore we ask that you do at least drink some water prior to coming in to hydrate your veins.     Follow-Up: At [facility], you and your health needs are our priority.  As part of our continuing mission to provide you with exceptional heart care, we have created designated Provider Care Teams.  These Care Teams include your primary Cardiologist (physician) and Advanced Practice Providers (APPs -  Physician Assistants and Nurse Practitioners) who all work together to provide you with the care you need, when you need it.  We recommend signing up for the patient portal called "MyChart".  Sign up information is provided on this After Visit Summary.  MyChart is used to connect with patients for Virtual Visits  (Telemedicine).  Patients are able to view lab/test results, encounter notes, upcoming appointments, etc.  Non-urgent messages can be sent to your provider as well.   To learn more about what you can do with MyChart, go to [email or web address].    Your next appointment:   6-8 week(s) after the echo is completed  The format for your next appointment:   In Person  Provider:   Steffanie Dunn, MD   Other Instructions   Echocardiogram An echocardiogram is a procedure that uses painless sound waves (ultrasound) to produce an image of the heart. Images from an echocardiogram can provide important information about:  Signs of coronary artery disease (CAD).  Aneurysm detection. An aneurysm is a weak or damaged part of an artery wall that bulges out from the normal force of blood pumping through the body.  Heart size and shape. Changes in the size or shape of the heart can be associated with certain conditions, including heart failure, aneurysm, and CAD.  Heart muscle function.  Heart valve function.  Signs of a past heart attack.  Fluid buildup around the heart.  Thickening of the heart muscle.  A tumor or infectious growth around the heart valves. Tell a health care provider about:  Any allergies you have.  All medicines you are taking, including vitamins, herbs, eye drops, creams, and over-the-counter medicines.  Any blood disorders you have.  Any surgeries you have had.  Any medical conditions you have.  Whether you are pregnant or may be pregnant. What are the risks? Generally,  this is a safe procedure. However, problems may occur, including:  Allergic reaction to dye (contrast) that may be used during the procedure. What happens before the procedure? No specific preparation is needed. You may eat and drink normally. What happens during the procedure?   An IV tube may be inserted into one of your veins.  You may receive contrast through this tube. A contrast  is an injection that improves the quality of the pictures from your heart.  A gel will be applied to your chest.  A wand-like tool (transducer) will be moved over your chest. The gel will help to transmit the sound waves from the transducer.  The sound waves will harmlessly bounce off of your heart to allow the heart images to be captured in real-time motion. The images will be recorded on a computer. The procedure may vary among health care providers and hospitals. What happens after the procedure?  You may return to your normal, everyday life, including diet, activities, and medicines, unless your health care provider tells you not to do that. Summary  An echocardiogram is a procedure that uses painless sound waves (ultrasound) to produce an image of the heart.  Images from an echocardiogram can provide important information about the size and shape of your heart, heart muscle function, heart valve function, and fluid buildup around your heart.  You do not need to do anything to prepare before this procedure. You may eat and drink normally.  After the echocardiogram is completed, you may return to your normal, everyday life, unless your health care provider tells you not to do that. This information is not intended to replace advice given to you by your health care provider. Make sure you discuss any questions you have with your health care provider. Document Revised: 02/15/2019 Document Reviewed: 11/27/2016 Elsevier Patient Education  [place].

## 2020-11-12 NOTE — Progress Notes (Signed)
Electrophysiology Office Follow up Visit Note:    Date:  11/12/2020   ID:  [name], DOB July 31, 1935, MRN [id number]  PCP:  Idelia Salm, MD  [facility] Cardiologist:  Lorine Bears, MD  [facility] Electrophysiologist:  None    Interval History:    [name] is a 86 y.o. male who presents for a follow up visit.  I last saw him while he was an inpatient on September 06, 2020.  This was in the setting of a VF arrest that occurred in the setting of a complicated NSTEMI.  He had recently undergone a 5 vessel bypass surgery.  At that time, the patient's VF arrest was thought to be in the setting of acute ischemia and thus ICD implant was deferred.  Since discharge, the patient has been doing quite well without ischemic symptoms.  No syncope or presyncope.  No palpitations.    Past Medical History:  Diagnosis Date  . Diabetes mellitus without complication (HCC)   . Hypertension     Past Surgical History:  Procedure Laterality Date  . BACK SURGERY    . CARDIAC CATHETERIZATION    . CLIPPING OF ATRIAL APPENDAGE N/A 08/29/2020   Procedure: CLIPPING OF ATRIAL APPENDAGE USING ATRICURE 45 MM ATRICLIP FLEX-V;  Surgeon: Delight Ovens, MD;  Location: [facility] OR;  Service: Open Heart Surgery;  Laterality: N/A;  . CORONARY ARTERY BYPASS GRAFT N/A 08/29/2020   Procedure: CORONARY ARTERY BYPASS GRAFTING (CABG) X 5 USING LEFT INTERNAL MAMMARY ARTERY AND ENDOSCOPICALLY HARVESTED RIGHT GREATER SAPHENOUS VEIN. LIMA TO LAD, SVG TO OM SEQ TO CIRC, SVG TO PD, SVG TO DIAG.;  Surgeon: Delight Ovens, MD;  Location: [facility] OR;  Service: Open Heart Surgery;  Laterality: N/A;  . ENDOVEIN HARVEST OF GREATER SAPHENOUS VEIN Right 08/29/2020   Procedure: ENDOVEIN HARVEST OF GREATER SAPHENOUS VEIN;  Surgeon: Delight Ovens, MD;  Location: [facility] OR;  Service: Open Heart Surgery;  Laterality: Right;  . HERNIA REPAIR    . LEFT HEART CATH AND CORONARY ANGIOGRAPHY N/A 08/25/2020   Procedure: LEFT  HEART CATH AND CORONARY ANGIOGRAPHY;  Surgeon: Iran Ouch, MD;  Location: [facility] INVASIVE CV LAB;  Service: Cardiovascular;  Laterality: N/A;  . TEE WITHOUT CARDIOVERSION N/A 08/29/2020   Procedure: TRANSESOPHAGEAL ECHOCARDIOGRAM (TEE);  Surgeon: Delight Ovens, MD;  Location: [facility];  Service: Open Heart Surgery;  Laterality: N/A;    Current Medications: Current Meds  Medication Sig  . acetaminophen (TYLENOL) 500 MG tablet Take 500-1,000 mg by mouth every 6 (six) hours as needed for mild pain or moderate pain.   Marland Kitchen amLODipine (NORVASC) 10 MG tablet Take 1 tablet (10 mg total) by mouth daily.  Marland Kitchen aspirin EC 81 MG tablet Take 81 mg by mouth daily.  . clopidogrel (PLAVIX) 75 MG tablet Take 1 tablet (75 mg total) by mouth daily.  . fluticasone (FLOVENT HFA) 44 MCG/ACT inhaler Inhale 2 puffs into the lungs 2 (two) times daily.  . furosemide (LASIX) 80 MG tablet Take 1 tablet (80 mg total) by mouth daily.  . insulin detemir (LEVEMIR) 100 UNIT/ML injection Inject 0.2 mLs (20 Units total) into the skin 2 (two) times daily.  . magnesium oxide (MAG-OX) 400 MG tablet Take 1 tablet (400 mg total) by mouth daily.  . Multiple Vitamin (MULTIVITAMIN WITH MINERALS) TABS tablet Take 1 tablet by mouth daily.  Marland Kitchen oxybutynin (DITROPAN) 5 MG tablet Take 5 mg by mouth 2 (two) times daily.  . pantoprazole (PROTONIX) 40 MG tablet  Take 40 mg by mouth 2 (two) times daily.  . potassium chloride (KLOR-CON) 10 MEQ tablet Take 1 tablet (10 mEq total) by mouth daily. Only when taking lasix  . rosuvastatin (CRESTOR) 40 MG tablet Take 1 tablet (40 mg total) by mouth daily.  Marland Kitchen spironolactone (ALDACTONE) 50 MG tablet Take 1 tablet (50 mg total) by mouth daily.  . traMADol (ULTRAM) 50 MG tablet Take 1 tablet (50 mg total) by mouth every 12 (twelve) hours as needed for severe pain.     Allergies:   Metformin   Social History   Socioeconomic History  . Marital status: Married    Spouse name: Not on file  . Number of  children: Not on file  . Years of education: Not on file  . Highest education level: Not on file  Occupational History  . Not on file  Tobacco Use  . Smoking status: Former Smoker    Quit date: 07/26/1996    Years since quitting: 24.3  . Smokeless tobacco: Never Used  Vaping Use  . Vaping Use: Never used  Substance and Sexual Activity  . Alcohol use: Not Currently  . Drug use: Never  . Sexual activity: Yes  Other Topics Concern  . Not on file  Social History Narrative  . Not on file   Social Determinants of Health   Financial Resource Strain: Not on file  Food Insecurity: No Food Insecurity  . Worried About Charity fundraiser in the Last Year: Never true  . Ran Out of Food in the Last Year: Never true  Transportation Needs: No Transportation Needs  . Lack of Transportation (Medical): No  . Lack of Transportation (Non-Medical): No  Physical Activity: Not on file  Stress: Not on file  Social Connections: Not on file     Family History: The patient's family history includes Heart attack in his mother.  ROS:   Please see the history of present illness.    All other systems reviewed and are negative.  EKGs/Labs/Other Studies Reviewed:    The following studies were reviewed today:  September 06, 2020 echo personally reviewed Left ventricular function normal, 50% Right ventricular function normal Improvement in his left ventricular function compared to prior echo    EKG:  The ekg ordered today demonstrates sinus rhythm with what appears to be winky block conduction and blocked PACs.  The QRS demonstrates an incomplete right bundle branch block pattern.  There is a left axis deviation consistent with a left anterior fascicular block  Recent Labs: 08/27/2020: B Natriuretic Peptide 861.3 08/28/2020: ALT 38 08/30/2020: Magnesium 2.2 09/15/2020: BUN 19; Creatinine, Ser 1.69; Hemoglobin 10.7; Platelets 360; Potassium 4.8; Sodium 142  Recent Lipid Panel    Component Value  Date/Time   CHOL 119 08/27/2020 0703   CHOL 133 05/22/2013 0225   TRIG 86 08/27/2020 0703   TRIG 68 05/22/2013 0225   HDL 40 (L) 08/27/2020 0703   HDL 50 05/22/2013 0225   CHOLHDL 3.0 08/27/2020 0703   VLDL 17 08/27/2020 0703   VLDL 14 05/22/2013 0225   LDLCALC 62 08/27/2020 0703   LDLCALC 69 05/22/2013 0225    Physical Exam:    VS:  BP 124/70 (BP Location: Left Arm, Patient Position: Sitting, Cuff Size: Normal)   Pulse (!) 45   Ht 5\' 8"  (1.727 m)   Wt 219 lb (99.3 kg)   SpO2 98%   BMI 33.30 kg/m     Wt Readings from Last 3 Encounters:  11/12/20 219  lb (99.3 kg)  10/27/20 222 lb 8 oz (100.9 kg)  10/09/20 214 lb (97.1 kg)     GEN: Well nourished, well developed in no acute distress HEENT: Normal NECK: No JVD; No carotid bruits LYMPHATICS: No lymphadenopathy CARDIAC: RRR, no murmurs, rubs, gallops RESPIRATORY:  Clear to auscultation without rales, wheezing or rhonchi  ABDOMEN: Soft, non-tender, non-distended MUSCULOSKELETAL:  No edema; No deformity  SKIN: Warm and dry NEUROLOGIC:  Alert and oriented x 3 PSYCHIATRIC:  Normal affect   ASSESSMENT:    1. Coronary artery disease of native artery of native heart with stable angina pectoris (HCC)   2. HFrEF (heart failure with reduced ejection fraction) ([place])   3. Essential hypertension   4. Cardiac arrest [facility])    PLAN:    In order of problems listed above:  1. Coronary artery disease post 5 vessel CABG Doing well.  No ischemic symptoms. EF normal. We will repeat echocardiogram to reassess left ventricular function after revascularization to confirm it is still normal.  2.  History of VF arrest in the setting of ischemia now post 5 vessel CABG At this point, no indication for ICD implant.  We will repeat the echocardiogram to reassess left ventricular function post revascularization.  The VF arrest that led to the hospitalization was in the setting of active ischemia.   Medication Adjustments/Labs and Tests  Ordered: Current medicines are reviewed at length with the patient today.  Concerns regarding medicines are outlined above.  Orders Placed This Encounter  Procedures  . EKG 12-Lead  . ECHOCARDIOGRAM COMPLETE   No orders of the defined types were placed in this encounter.    Signed, Lars Mage, MD, [facility]  11/12/2020 3:49 PM    Electrophysiology [place]

## 2020-11-19 ENCOUNTER — Other Ambulatory Visit: Payer: Self-pay | Admitting: *Deleted

## 2020-11-19 DIAGNOSIS — Z951 Presence of aortocoronary bypass graft: Secondary | ICD-10-CM

## 2020-11-20 ENCOUNTER — Ambulatory Visit (INDEPENDENT_AMBULATORY_CARE_PROVIDER_SITE_OTHER): Payer: Self-pay | Admitting: Cardiothoracic Surgery

## 2020-11-20 ENCOUNTER — Other Ambulatory Visit: Payer: Self-pay

## 2020-11-20 ENCOUNTER — Other Ambulatory Visit: Payer: Self-pay | Admitting: *Deleted

## 2020-11-20 ENCOUNTER — Ambulatory Visit
Admission: RE | Admit: 2020-11-20 | Discharge: 2020-11-20 | Disposition: A | Discharge status: Home / Self Care | Payer: Medicare HMO | Source: Ambulatory Visit | Attending: Cardiothoracic Surgery | Admitting: Cardiothoracic Surgery

## 2020-11-20 VITALS — BP 170/80 | HR 62 | Temp 97.6°F | Resp 20 | Ht 68.0 in | Wt 219.0 lb

## 2020-11-20 DIAGNOSIS — Z951 Presence of aortocoronary bypass graft: Secondary | ICD-10-CM

## 2020-11-20 DIAGNOSIS — I214 Non-ST elevation (NSTEMI) myocardial infarction: Secondary | ICD-10-CM

## 2020-11-20 DIAGNOSIS — I2510 Atherosclerotic heart disease of native coronary artery without angina pectoris: Secondary | ICD-10-CM
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
## 2020-11-27 ENCOUNTER — Ambulatory Visit (INDEPENDENT_AMBULATORY_CARE_PROVIDER_SITE_OTHER): Payer: Medicare HMO

## 2020-11-27 ENCOUNTER — Other Ambulatory Visit: Payer: Self-pay

## 2020-11-27 DIAGNOSIS — I5020 Unspecified systolic (congestive) heart failure: Secondary | ICD-10-CM | POA: Diagnosis not present

## 2020-11-27 LAB — ECHOCARDIOGRAM COMPLETE
Area-P 1/2: 2.54 cm2
S' Lateral: 4 cm

## 2020-12-01 ENCOUNTER — Encounter: Payer: Self-pay | Admitting: Cardiothoracic Surgery

## 2020-12-09 ENCOUNTER — Telehealth: Payer: Self-pay | Admitting: Cardiology

## 2020-12-09 NOTE — Telephone Encounter (Signed)
Patient dropped off DMV forms to be completed  Placed in nurse box

## 2020-12-09 NOTE — Telephone Encounter (Signed)
Paperwork obtained from the front desk. I have completed the cardiovascular section of the form. I will give the form to Dr. Tyrell Antonio nurse for him to sign as the primary cardiologist.

## 2020-12-24 ENCOUNTER — Other Ambulatory Visit: Payer: Self-pay

## 2020-12-24 ENCOUNTER — Encounter: Payer: Self-pay | Admitting: Cardiology

## 2020-12-24 ENCOUNTER — Ambulatory Visit (INDEPENDENT_AMBULATORY_CARE_PROVIDER_SITE_OTHER): Payer: Medicare HMO | Admitting: Cardiology

## 2020-12-24 VITALS — BP 144/80 | HR 57 | Ht 68.0 in | Wt 225.0 lb

## 2020-12-24 DIAGNOSIS — I469 Cardiac arrest, cause unspecified: Secondary | ICD-10-CM

## 2020-12-24 DIAGNOSIS — I25118 Atherosclerotic heart disease of native coronary artery with other forms of angina pectoris: Secondary | ICD-10-CM | POA: Diagnosis not present

## 2020-12-24 NOTE — Patient Instructions (Addendum)
Medication Instructions:  - Your physician recommends that you continue on your current medications as directed. Please refer to the Current Medication list given to you today.  *If you need a refill on your cardiac medications before your next appointment, please call your pharmacy*   Lab Work: - none ordered  If you have labs (blood work) drawn today and your tests are completely normal, you will receive your results only by: Marland Kitchen MyChart Message (if you have MyChart) OR . A paper copy in the mail If you have any lab test that is abnormal or we need to change your treatment, we will call you to review the results.   Testing/Procedures: - none ordered   Follow-Up: At [facility], you and your health needs are our priority.  As part of our continuing mission to provide you with exceptional heart care, we have created designated Provider Care Teams.  These Care Teams include your primary Cardiologist (physician) and Advanced Practice Providers (APPs -  Physician Assistants and Nurse Practitioners) who all work together to provide you with the care you need, when you need it.  We recommend signing up for the patient portal called "MyChart".  Sign up information is provided on this After Visit Summary.  MyChart is used to connect with patients for Virtual Visits (Telemedicine).  Patients are able to view lab/test results, encounter notes, upcoming appointments, etc.  Non-urgent messages can be sent to your provider as well.   To learn more about what you can do with MyChart, go to [email or web address].    Your next appointment:   As needed   The format for your next appointment:   n/a  Provider:   Lars Mage, MD   Other Instructions  Your [facility] paperwork was given to you today- the cardiology section was completed by Dr. Fletcher Anon.

## 2020-12-24 NOTE — Progress Notes (Signed)
Electrophysiology Office Follow up Visit Note:    Date:  12/24/2020   ID:  [name], DOB 1935-10-13, MRN [id number]  PCP:  Tomasita Morrow, MD  [facility] Cardiologist:  [name], MD  [facility] Electrophysiologist:  Vickie Epley, MD    Interval History:    [name] is a 86 y.o. male who presents for a follow up visit.  I last saw the patient November 12, 2020 after he was previously hospitalized in October 8242 for complicated NSTEMI with VF arrest that was managed with a 5 vessel bypass.  At that appointment we ordered an echo to confirm normal left ventricular function after his bypass surgery.  He completed this on January 20 and it showed normal left ventricular function.     Past Medical History:  Diagnosis Date  . Diabetes mellitus without complication ([place])   . Hypertension     Past Surgical History:  Procedure Laterality Date  . BACK SURGERY    . CARDIAC CATHETERIZATION    . CLIPPING OF ATRIAL APPENDAGE N/A 08/29/2020   Procedure: CLIPPING OF ATRIAL APPENDAGE USING ATRICURE 34 MM ATRICLIP FLEX-V;  Surgeon: Grace Isaac, MD;  Location: [place];  Service: Open Heart Surgery;  Laterality: N/A;  . CORONARY ARTERY BYPASS GRAFT N/A 08/29/2020   Procedure: CORONARY ARTERY BYPASS GRAFTING (CABG) X 5 USING LEFT INTERNAL MAMMARY ARTERY AND ENDOSCOPICALLY HARVESTED RIGHT GREATER SAPHENOUS VEIN. LIMA TO LAD, SVG TO OM SEQ TO CIRC, SVG TO PD, SVG TO DIAG.;  Surgeon: Grace Isaac, MD;  Location: [place];  Service: Open Heart Surgery;  Laterality: N/A;  . ENDOVEIN HARVEST OF GREATER SAPHENOUS VEIN Right 08/29/2020   Procedure: ENDOVEIN HARVEST OF GREATER SAPHENOUS VEIN;  Surgeon: Grace Isaac, MD;  Location: [place];  Service: Open Heart Surgery;  Laterality: Right;  . HERNIA REPAIR    . LEFT HEART CATH AND CORONARY ANGIOGRAPHY N/A 08/25/2020   Procedure: LEFT HEART CATH AND CORONARY ANGIOGRAPHY;  Surgeon: [name], MD;  Location: [place] [facility];  Service: Cardiovascular;  Laterality: N/A;  . TEE WITHOUT CARDIOVERSION N/A 08/29/2020   Procedure: TRANSESOPHAGEAL ECHOCARDIOGRAM (TEE);  Surgeon: Grace Isaac, MD;  Location: [place];  Service: Open Heart Surgery;  Laterality: N/A;    Current Medications: Current Meds  Medication Sig  . acetaminophen (TYLENOL) 500 MG tablet Take 500-1,000 mg by mouth every 6 (six) hours as needed for mild pain or moderate pain.   Marland Kitchen acyclovir (ZOVIRAX) 800 MG tablet Take 800 mg by mouth daily.  Marland Kitchen amLODipine (NORVASC) 10 MG tablet Take 1 tablet (10 mg total) by mouth daily.  Marland Kitchen aspirin EC 81 MG tablet Take 81 mg by mouth daily.  . clopidogrel (PLAVIX) 75 MG tablet Take 1 tablet (75 mg total) by mouth daily.  . fluticasone (FLOVENT HFA) 44 MCG/ACT inhaler Inhale 2 puffs into the lungs 2 (two) times daily.  . furosemide (LASIX) 80 MG tablet Take 1 tablet (80 mg total) by mouth daily.  . insulin detemir (LEVEMIR) 100 UNIT/ML injection Inject 0.2 mLs (20 Units total) into the skin 2 (two) times daily.  . magnesium oxide (MAG-OX) 400 MG tablet Take 1 tablet (400 mg total) by mouth daily.  . Multiple Vitamin (MULTIVITAMIN WITH MINERALS) TABS tablet Take 1 tablet by mouth daily.  Marland Kitchen oxybutynin (DITROPAN) 5 MG tablet Take 5 mg by mouth 2 (two) times daily.  . pantoprazole (PROTONIX) 40 MG tablet Take 40 mg by mouth 2 (two) times  daily.  . potassium chloride (KLOR-CON) 10 MEQ tablet Take 1 tablet (10 mEq total) by mouth daily. Only when taking lasix  . rosuvastatin (CRESTOR) 40 MG tablet Take 1 tablet (40 mg total) by mouth daily.  Marland Kitchen spironolactone (ALDACTONE) 50 MG tablet Take 1 tablet (50 mg total) by mouth daily.  . traMADol (ULTRAM) 50 MG tablet Take 1 tablet (50 mg total) by mouth every 12 (twelve) hours as needed for severe pain.     Allergies:   Metformin   Social History   Socioeconomic History  . Marital status: Married    Spouse name: Not on file  . Number of children: Not on  file  . Years of education: Not on file  . Highest education level: Not on file  Occupational History  . Not on file  Tobacco Use  . Smoking status: Former Smoker    Quit date: 07/26/1996    Years since quitting: 24.4  . Smokeless tobacco: Never Used  Vaping Use  . Vaping Use: Never used  Substance and Sexual Activity  . Alcohol use: Not Currently  . Drug use: Never  . Sexual activity: Yes  Other Topics Concern  . Not on file  Social History Narrative  . Not on file   Social Determinants of Health   Financial Resource Strain: Not on file  Food Insecurity: No Food Insecurity  . Worried About Charity fundraiser in the Last Year: Never true  . Ran Out of Food in the Last Year: Never true  Transportation Needs: No Transportation Needs  . Lack of Transportation (Medical): No  . Lack of Transportation (Non-Medical): No  Physical Activity: Not on file  Stress: Not on file  Social Connections: Not on file     Family History: The patient's family history includes Heart attack in his mother.  ROS:   Please see the history of present illness.    All other systems reviewed and are negative.  EKGs/Labs/Other Studies Reviewed:    The following studies were reviewed today:  November 27, 2020 echo personally reviewed Left ventricular function normal, 55% No regional wall motion abnormalities Right ventricular function normal No significant valvular abnormalities  EKG:  The ekg ordered today demonstrates sinus rhythm with blocked PACs. Incomplete right bundle branch block. Left anterior fascular block.   Recent Labs: 08/27/2020: B Natriuretic Peptide 861.3 08/28/2020: ALT 38 08/30/2020: Magnesium 2.2 09/15/2020: BUN 19; Creatinine, Ser 1.69; Hemoglobin 10.7; Platelets 360; Potassium 4.8; Sodium 142  Recent Lipid Panel    Component Value Date/Time   CHOL 119 08/27/2020 0703   CHOL 133 05/22/2013 0225   TRIG 86 08/27/2020 0703   TRIG 68 05/22/2013 0225   HDL 40 (L)  08/27/2020 0703   HDL 50 05/22/2013 0225   CHOLHDL 3.0 08/27/2020 0703   VLDL 17 08/27/2020 0703   VLDL 14 05/22/2013 0225   LDLCALC 62 08/27/2020 0703   LDLCALC 69 05/22/2013 0225    Physical Exam:    VS:  BP (!) 144/80 (BP Location: Left Arm, Patient Position: Sitting, Cuff Size: Large)   Pulse (!) 57   Ht 5\' 8"  (1.727 m)   Wt 225 lb (102.1 kg)   SpO2 97%   BMI 34.21 kg/m     Wt Readings from Last 3 Encounters:  12/24/20 225 lb (102.1 kg)  11/20/20 219 lb (99.3 kg)  11/12/20 219 lb (99.3 kg)     GEN:  Well nourished, well developed in no acute distress HEENT: Normal NECK: No  JVD; No carotid bruits LYMPHATICS: No lymphadenopathy CARDIAC: RRR, no murmurs, rubs, gallops RESPIRATORY:  Clear to auscultation without rales, wheezing or rhonchi  ABDOMEN: Soft, non-tender, non-distended MUSCULOSKELETAL:  No edema; No deformity  SKIN: Warm and dry NEUROLOGIC:  Alert and oriented x 3 PSYCHIATRIC:  Normal affect   ASSESSMENT:    1. Coronary artery disease of native artery of native heart with stable angina pectoris ([place])   2. Cardiac arrest ([place])   3. Essential hypertension    PLAN:    In order of problems listed above:  1. Coronary artery disease post 5 vessel bypass in October 2021. Patient is without ischemic symptoms today. Thankfully his left ventricular function is completely normal on his recent echocardiogram. He has not had recurrent episode of chest pain. No recurrent episodes of syncope or presyncope. His VF arrest was in the setting of active severe ischemia that has been fully revascularized by the 5 vessel bypass as evidenced by the normal echocardiogram. I do not have any evidence of recurrent arrhythmias.  At this time, there is no indication for an ICD.  I would plan to see him back on an as-needed basis. He is closely followed by Dr. Fletcher Anon.   Medication Adjustments/Labs and Tests Ordered: Current medicines are reviewed at length with the patient today.   Concerns regarding medicines are outlined above.  No orders of the defined types were placed in this encounter.  No orders of the defined types were placed in this encounter.    Signed, Lars Mage, MD, [facility]  12/24/2020 10:18 AM    Electrophysiology [facility]

## 2020-12-24 NOTE — Telephone Encounter (Signed)
DMV form given to the patient. I have advised him we only complete the Cardiovascular section of the paperwork and have attached his current med list.  I have advised him that all other portions of the paperwork will need to be completed by his PCP/ specialists.   The patient states he has an appointment with his PCP on Monday and will address the rest of the form with his at that time.

## 2020-12-24 NOTE — Telephone Encounter (Signed)
Patient will be seen today by Dr. Quentin Ore. The form has been signed by Dr. Fletcher Anon and given to Marsh Dolly., RN.

## 2021-02-02 ENCOUNTER — Telehealth: Payer: Self-pay | Admitting: Cardiovascular Disease

## 2021-02-02 NOTE — Telephone Encounter (Signed)
Patient calling  States that he needs a letter completed so he can start driving Patient will drop off by office tomorrow

## 2021-02-03 ENCOUNTER — Telehealth: Payer: Self-pay | Admitting: Cardiology

## 2021-02-03 NOTE — Telephone Encounter (Signed)
Patient stopped by in office today to drop off dmv driving forms. Placed in Dr. Claudie Revering box for review. Please call patient when forms are ready

## 2021-02-04 NOTE — Telephone Encounter (Signed)
Letter completed.  Pt notified letter and paperwork ready for pick up.  Left up front for pick up.

## 2021-02-13 ENCOUNTER — Other Ambulatory Visit: Payer: Self-pay | Admitting: Family

## 2021-02-13 NOTE — Telephone Encounter (Signed)
Rx request sent to pharmacy.  

## 2021-02-27 ENCOUNTER — Other Ambulatory Visit: Payer: Self-pay

## 2021-02-27 ENCOUNTER — Ambulatory Visit (INDEPENDENT_AMBULATORY_CARE_PROVIDER_SITE_OTHER): Payer: Medicare HMO | Admitting: Cardiovascular Disease

## 2021-02-27 ENCOUNTER — Encounter: Payer: Self-pay | Admitting: Cardiovascular Disease

## 2021-02-27 VITALS — BP 172/80 | HR 62 | Ht 68.0 in | Wt 229.0 lb

## 2021-02-27 DIAGNOSIS — I10 Essential (primary) hypertension: Secondary | ICD-10-CM

## 2021-02-27 DIAGNOSIS — I2510 Atherosclerotic heart disease of native coronary artery without angina pectoris: Secondary | ICD-10-CM | POA: Diagnosis not present

## 2021-02-27 DIAGNOSIS — I5042 Chronic combined systolic (congestive) and diastolic (congestive) heart failure: Secondary | ICD-10-CM | POA: Diagnosis not present

## 2021-02-27 DIAGNOSIS — E785 Hyperlipidemia, unspecified: Secondary | ICD-10-CM | POA: Diagnosis not present

## 2021-02-27 MED ORDER — HYDRALAZINE HCL 25 MG PO TABS: 25.0000 mg | ORAL_TABLET | Freq: Two times a day (BID) | ORAL | 3 refills | Status: DC

## 2021-02-27 MED ORDER — TRAMADOL HCL 50 MG PO TABS: 50.0000 mg | ORAL_TABLET | Freq: Two times a day (BID) | ORAL | 0 refills | Status: DC | PRN

## 2021-02-27 MED ORDER — FUROSEMIDE 80 MG PO TABS: 80.0000 mg | ORAL_TABLET | Freq: Every day | ORAL | 2 refills | Status: DC
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
## 2021-02-27 NOTE — Patient Instructions (Signed)
Medication Instructions:  Your physician has recommended you make the following change in your medication:   START Hydralazine 25 mg twice daily. An Rx has been sent to your pharmacy.  Tramadol was refilled today as a Manufacturing engineer. Further refills request will need to be sent to primary care.   *If you need a refill on your cardiac medications before your next appointment, please call your pharmacy*   Lab Work: None ordered  If you have labs (blood work) drawn today and your tests are completely normal, you will receive your results only by: Marland Kitchen MyChart Message (if you have MyChart) OR . A paper copy in the mail If you have any lab test that is abnormal or we need to change your treatment, we will call you to review the results.   Testing/Procedures: None ordered   Follow-Up: At [facility], you and your health needs are our priority.  As part of our continuing mission to provide you with exceptional heart care, we have created designated Provider Care Teams.  These Care Teams include your primary Cardiologist (physician) and Advanced Practice Providers (APPs -  Physician Assistants and Nurse Practitioners) who all work together to provide you with the care you need, when you need it.  We recommend signing up for the patient portal called "MyChart".  Sign up information is provided on this After Visit Summary.  MyChart is used to connect with patients for Virtual Visits (Telemedicine).  Patients are able to view lab/test results, encounter notes, upcoming appointments, etc.  Non-urgent messages can be sent to your provider as well.   To learn more about what you can do with MyChart, go to [email or web address].    Your next appointment:   4 month(s)  The format for your next appointment:   In Person  Provider:   You may see [name], MD or one of the following Advanced Practice Providers on your designated Care Team:    Murray Hodgkins, NP  Christell Faith, PA-C  Marrianne Mood, PA-C  Cadence Natalbany, [place]  Laurann Montana, NP    Other Instructions N/A

## 2021-03-18 ENCOUNTER — Encounter: Payer: Medicare HMO | Attending: Cardiovascular Disease | Admitting: *Deleted

## 2021-03-18 ENCOUNTER — Other Ambulatory Visit: Payer: Self-pay

## 2021-03-18 ENCOUNTER — Encounter: Payer: Self-pay | Admitting: *Deleted

## 2021-03-18 DIAGNOSIS — Z48812 Encounter for surgical aftercare following surgery on the circulatory system: Secondary | ICD-10-CM | POA: Insufficient documentation

## 2021-03-18 DIAGNOSIS — Z951 Presence of aortocoronary bypass graft: Secondary | ICD-10-CM

## 2021-03-18 DIAGNOSIS — I214 Non-ST elevation (NSTEMI) myocardial infarction: Secondary | ICD-10-CM | POA: Insufficient documentation

## 2021-03-18 NOTE — Progress Notes (Signed)
Initial telephone orientation completed. Diagnosis can be found in [facility] 4/22. EP orientation scheduled for Tuesday 5/17 at 10am

## 2021-03-24 ENCOUNTER — Other Ambulatory Visit: Payer: Self-pay

## 2021-03-24 ENCOUNTER — Encounter: Payer: Medicare HMO | Admitting: *Deleted

## 2021-03-24 VITALS — Ht 68.6 in | Wt 228.1 lb

## 2021-03-24 DIAGNOSIS — Z951 Presence of aortocoronary bypass graft: Secondary | ICD-10-CM | POA: Diagnosis present

## 2021-03-24 DIAGNOSIS — Z48812 Encounter for surgical aftercare following surgery on the circulatory system: Secondary | ICD-10-CM | POA: Diagnosis present

## 2021-03-24 DIAGNOSIS — I214 Non-ST elevation (NSTEMI) myocardial infarction: Secondary | ICD-10-CM | POA: Diagnosis not present

## 2021-03-24 NOTE — Progress Notes (Signed)
Cardiac Individual Treatment Plan  Patient Details  Name: [name] MRN: [id number] Date of Birth: 1935-02-08 Referring Provider:   Flowsheet Row Cardiac Rehab from 03/24/2021 in [facility] Cardiac and Pulmonary Rehab  Referring Provider [name] MD      Initial Encounter Date:  Flowsheet Row Cardiac Rehab from 03/24/2021 in [facility] Cardiac and Pulmonary Rehab  Date 03/24/21      Visit Diagnosis: NSTEMI (non-ST elevation myocardial infarction) ([place])  S/P CABG x 5  Patient's Home Medications on Admission:  Current Outpatient Medications:  .  acetaminophen (TYLENOL) 500 MG tablet, Take 500-1,000 mg by mouth every 6 (six) hours as needed for mild pain or moderate pain. , Disp: , Rfl:  .  acyclovir (ZOVIRAX) 800 MG tablet, Take 800 mg by mouth daily., Disp: , Rfl:  .  amLODipine (NORVASC) 10 MG tablet, Take 1 tablet (10 mg total) by mouth daily., Disp: 90 tablet, Rfl: 1 .  aspirin EC 81 MG tablet, Take 81 mg by mouth daily., Disp: , Rfl:  .  clopidogrel (PLAVIX) 75 MG tablet, Take 1 tablet (75 mg total) by mouth daily., Disp: 90 tablet, Rfl: 1 .  fluticasone (FLOVENT HFA) 44 MCG/ACT inhaler, Inhale 2 puffs into the lungs 2 (two) times daily., Disp: , Rfl:  .  furosemide (LASIX) 80 MG tablet, Take 1 tablet (80 mg total) by mouth daily., Disp: 90 tablet, Rfl: 2 .  hydrALAZINE (APRESOLINE) 25 MG tablet, Take 1 tablet (25 mg total) by mouth in the morning and at bedtime., Disp: 180 tablet, Rfl: 3 .  insulin detemir (LEVEMIR) 100 UNIT/ML injection, Inject 0.2 mLs (20 Units total) into the skin 2 (two) times daily., Disp: 10 mL, Rfl: 3 .  magnesium oxide (MAG-OX) 400 MG tablet, Take 1 tablet (400 mg total) by mouth daily., Disp: 90 tablet, Rfl: 1 .  Multiple Vitamin (MULTIVITAMIN WITH MINERALS) TABS tablet, Take 1 tablet by mouth daily., Disp: , Rfl:  .  oxybutynin (DITROPAN) 5 MG tablet, Take 5 mg by mouth 2 (two) times daily., Disp: , Rfl:  .  pantoprazole (PROTONIX) 40 MG tablet, Take 40  mg by mouth 2 (two) times daily., Disp: , Rfl:  .  potassium chloride (KLOR-CON) 10 MEQ tablet, Take 1 tablet (10 mEq total) by mouth daily. Only when taking lasix, Disp: 30 tablet, Rfl: 5 .  rosuvastatin (CRESTOR) 40 MG tablet, Take 1 tablet (40 mg total) by mouth daily., Disp: 90 tablet, Rfl: 1 .  spironolactone (ALDACTONE) 50 MG tablet, Take 1 tablet (50 mg total) by mouth daily., Disp: 90 tablet, Rfl: 1 .  traMADol (ULTRAM) 50 MG tablet, Take 1 tablet (50 mg total) by mouth every 12 (twelve) hours as needed for severe pain. Further refill request will need to be sent to primary care., Disp: 30 tablet, Rfl: 0  Past Medical History: Past Medical History:  Diagnosis Date  . Diabetes mellitus without complication ([place])   . Hypertension     Tobacco Use: Social History   Tobacco Use  Smoking Status Former Smoker  . Quit date: 07/26/1996  . Years since quitting: 24.6  Smokeless Tobacco Never Used    Labs: Recent Chemical engineer    Labs for ITP Cardiac and Pulmonary Rehab Latest Ref Rng & Units 08/29/2020 08/30/2020 08/30/2020 08/31/2020 09/01/2020   Cholestrol 0 - 200 mg/dL - - - - -   LDLCALC 0 - 99 mg/dL - - - - -   HDL >40 mg/dL - - - - -  Trlycerides <150 mg/dL - - - - -   Hemoglobin A1c 4.8 - 5.6 % - - - - -   PHART 7.350 - 7.450 7.342(L) 7.370 7.345(L) - -   PCO2ART 32.0 - 48.0 mmHg 35.8 35.6 36.1 - -   HCO3 20.0 - 28.0 mmol/L 19.7(L) 20.7 19.9(L) - -   TCO2 22 - 32 mmol/L 21(L) 22 21(L) - -   ACIDBASEDEF 0.0 - 2.0 mmol/L 6.0(H) 4.0(H) 5.0(H) - -   O2SAT % 90.0 94.0 96.0 75.9 70.0       Exercise Target Goals: Exercise Program Goal: Individual exercise prescription set using results from initial 6 min walk test and THRR while considering  patient's activity barriers and safety.   Exercise Prescription Goal: Initial exercise prescription builds to 30-45 minutes a day of aerobic activity, 2-3 days per week.  Home exercise guidelines will be given to patient during  program as part of exercise prescription that the participant will acknowledge.   Education: Aerobic Exercise: - Group verbal and visual presentation on the components of exercise prescription. Introduces F.I.T.T principle from ACSM for exercise prescriptions.  Reviews F.I.T.T. principles of aerobic exercise including progression. Written material given at graduation. Flowsheet Row Cardiac Rehab from 03/24/2021 in [facility] Cardiac and Pulmonary Rehab  Education need identified 03/24/21      Education: Resistance Exercise: - Group verbal and visual presentation on the components of exercise prescription. Introduces F.I.T.T principle from ACSM for exercise prescriptions  Reviews F.I.T.T. principles of resistance exercise including progression. Written material given at graduation.    Education: Exercise & Equipment Safety: - Individual verbal instruction and demonstration of equipment use and safety with use of the equipment. Flowsheet Row Cardiac Rehab from 03/24/2021 in [facility] Cardiac and Pulmonary Rehab  Date 03/24/21  Educator [facility]  Instruction Review Code 1- Verbalizes Understanding      Education: Exercise Physiology & General Exercise Guidelines: - Group verbal and written instruction with models to review the exercise physiology of the cardiovascular system and associated critical values. Provides general exercise guidelines with specific guidelines to those with heart or lung disease.    Education: Flexibility, Balance, Mind/Body Relaxation: - Group verbal and visual presentation with interactive activity on the components of exercise prescription. Introduces F.I.T.T principle from ACSM for exercise prescriptions. Reviews F.I.T.T. principles of flexibility and balance exercise training including progression. Also discusses the mind body connection.  Reviews various relaxation techniques to help reduce and manage stress (i.e. Deep breathing, progressive muscle relaxation, and visualization).  Balance handout provided to take home. Written material given at graduation.   Activity Barriers & Risk Stratification:  Activity Barriers & Cardiac Risk Stratification - 03/24/21 1144      Activity Barriers & Cardiac Risk Stratification   Activity Barriers Joint Problems;Deconditioning;Muscular Weakness;Shortness of Breath;Balance Concerns    Cardiac Risk Stratification High           6 Minute Walk:  6 Minute Walk    Row Name 03/24/21 1144         6 Minute Walk   Phase Initial     Distance 800 feet     Walk Time 6 minutes     # of Rest Breaks 0     MPH 1.52     METS 1.54     RPE 13     Perceived Dyspnea  2     VO2 Peak 5.39     Symptoms Yes (comment)     Comments SOB, fatigue     Resting HR  63 bpm     Resting BP 142/80     Resting Oxygen Saturation  97 %     Exercise Oxygen Saturation  during 6 min walk 98 %     Max Ex. HR 109 bpm     Max Ex. BP 194/96     2 Minute Post BP 150/82            Oxygen Initial Assessment:   Oxygen Re-Evaluation:   Oxygen Discharge (Final Oxygen Re-Evaluation):   Initial Exercise Prescription:  Initial Exercise Prescription - 03/24/21 1100      Date of Initial Exercise RX and Referring Provider   Date 03/24/21    Referring Provider [name] MD      Recumbant Bike   Level 1    RPM 50    Watts 10    Minutes 15    METs 2      NuStep   Level 1    SPM 80    Minutes 15    METs 2      Track   Laps 20    Minutes 15    METs 2.1      Prescription Details   Frequency (times per week) 3    Duration Progress to 30 minutes of continuous aerobic without signs/symptoms of physical distress      Intensity   THRR 40-80% of Max Heartrate 93-122    Ratings of Perceived Exertion 11-13    Perceived Dyspnea 0-4      Progression   Progression Continue to progress workloads to maintain intensity without signs/symptoms of physical distress.      Resistance Training   Training Prescription Yes    Weight 3 lb    Reps  10-15           Perform Capillary Blood Glucose checks as needed.  Exercise Prescription Changes:  Exercise Prescription Changes    Row Name 03/24/21 1100             Response to Exercise   Blood Pressure (Admit) 142/80       Blood Pressure (Exercise) 194/96       Blood Pressure (Exit) 136/76       Heart Rate (Admit) 63 bpm       Heart Rate (Exercise) 109 bpm       Heart Rate (Exit) 54 bpm       Oxygen Saturation (Admit) 97 %       Oxygen Saturation (Exercise) 98 %       Rating of Perceived Exertion (Exercise) 13       Perceived Dyspnea (Exercise) 2       Symptoms SOB, fatigue       Comments walk test results              Exercise Comments:   Exercise Goals and Review:  Exercise Goals    Row Name 03/24/21 1147             Exercise Goals   Increase Physical Activity Yes       Intervention Provide advice, education, support and counseling about physical activity/exercise needs.;Develop an individualized exercise prescription for aerobic and resistive training based on initial evaluation findings, risk stratification, comorbidities and participant's personal goals.       Expected Outcomes Short Term: Attend rehab on a regular basis to increase amount of physical activity.;Long Term: Add in home exercise to make exercise part of routine and to increase amount of physical activity.;Long Term: Exercising  regularly at least 3-5 days a week.       Increase Strength and Stamina Yes       Intervention Provide advice, education, support and counseling about physical activity/exercise needs.;Develop an individualized exercise prescription for aerobic and resistive training based on initial evaluation findings, risk stratification, comorbidities and participant's personal goals.       Expected Outcomes Short Term: Increase workloads from initial exercise prescription for resistance, speed, and METs.;Short Term: Perform resistance training exercises routinely during rehab and add  in resistance training at home;Long Term: Improve cardiorespiratory fitness, muscular endurance and strength as measured by increased METs and functional capacity (6MWT)       Able to understand and use rate of perceived exertion (RPE) scale Yes       Intervention Provide education and explanation on how to use RPE scale       Expected Outcomes Short Term: Able to use RPE daily in rehab to express subjective intensity level;Long Term:  Able to use RPE to guide intensity level when exercising independently       Able to understand and use Dyspnea scale Yes       Intervention Provide education and explanation on how to use Dyspnea scale       Expected Outcomes Short Term: Able to use Dyspnea scale daily in rehab to express subjective sense of shortness of breath during exertion;Long Term: Able to use Dyspnea scale to guide intensity level when exercising independently       Knowledge and understanding of Target Heart Rate Range (THRR) Yes       Intervention Provide education and explanation of THRR including how the numbers were predicted and where they are located for reference       Expected Outcomes Short Term: Able to state/look up THRR;Long Term: Able to use THRR to govern intensity when exercising independently;Short Term: Able to use daily as guideline for intensity in rehab       Able to check pulse independently Yes       Intervention Provide education and demonstration on how to check pulse in carotid and radial arteries.;Review the importance of being able to check your own pulse for safety during independent exercise       Expected Outcomes Short Term: Able to explain why pulse checking is important during independent exercise;Long Term: Able to check pulse independently and accurately       Understanding of Exercise Prescription Yes       Intervention Provide education, explanation, and written materials on patient's individual exercise prescription       Expected Outcomes Short Term: Able  to explain program exercise prescription;Long Term: Able to explain home exercise prescription to exercise independently              Exercise Goals Re-Evaluation :   Discharge Exercise Prescription (Final Exercise Prescription Changes):  Exercise Prescription Changes - 03/24/21 1100      Response to Exercise   Blood Pressure (Admit) 142/80    Blood Pressure (Exercise) 194/96    Blood Pressure (Exit) 136/76    Heart Rate (Admit) 63 bpm    Heart Rate (Exercise) 109 bpm    Heart Rate (Exit) 54 bpm    Oxygen Saturation (Admit) 97 %    Oxygen Saturation (Exercise) 98 %    Rating of Perceived Exertion (Exercise) 13    Perceived Dyspnea (Exercise) 2    Symptoms SOB, fatigue    Comments walk test results  Nutrition:  Target Goals: Understanding of nutrition guidelines, daily intake of sodium 1500mg , cholesterol 200mg , calories 30% from fat and 7% or less from saturated fats, daily to have 5 or more servings of fruits and vegetables.  Education: All About Nutrition: -Group instruction provided by verbal, written material, interactive activities, discussions, models, and posters to present general guidelines for heart healthy nutrition including fat, fiber, MyPlate, the role of sodium in heart healthy nutrition, utilization of the nutrition label, and utilization of this knowledge for meal planning. Follow up email sent as well. Written material given at graduation. Flowsheet Row Cardiac Rehab from 03/24/2021 in [facility] Cardiac and Pulmonary Rehab  Education need identified 03/24/21      Biometrics:  Pre Biometrics - 03/24/21 1244      Pre Biometrics   Height 5' 8.6" (1.742 m)    Weight 228 lb 1.6 oz (103.5 kg)    BMI (Calculated) 34.1    Single Leg Stand 1.2 seconds            Nutrition Therapy Plan and Nutrition Goals:   Nutrition Assessments:  MEDIFICTS Score Key:  ?70 Need to make dietary changes   40-70 Heart Healthy Diet  ? 40 Therapeutic Level  Cholesterol Diet  Flowsheet Row Cardiac Rehab from 03/24/2021 in [facility] Cardiac and Pulmonary Rehab  Picture Your Plate Total Score on Admission 80     Picture Your Plate Scores:  [id number] Unhealthy dietary pattern with much room for improvement.  41-50 Dietary pattern unlikely to meet recommendations for good health and room for improvement.  51-60 More healthful dietary pattern, with some room for improvement.   >60 Healthy dietary pattern, although there may be some specific behaviors that could be improved.    Nutrition Goals Re-Evaluation:   Nutrition Goals Discharge (Final Nutrition Goals Re-Evaluation):   Psychosocial: Target Goals: Acknowledge presence or absence of significant depression and/or stress, maximize coping skills, provide positive support system. Participant is able to verbalize types and ability to use techniques and skills needed for reducing stress and depression.   Education: Stress, Anxiety, and Depression - Group verbal and visual presentation to define topics covered.  Reviews how body is impacted by stress, anxiety, and depression.  Also discusses healthy ways to reduce stress and to treat/manage anxiety and depression.  Written material given at graduation.   Education: Sleep Hygiene -Provides group verbal and written instruction about how sleep can affect your health.  Define sleep hygiene, discuss sleep cycles and impact of sleep habits. Review good sleep hygiene tips.    Initial Review & Psychosocial Screening:  Initial Psych Review & Screening - 03/18/21 1039      Initial Review   Current issues with None Identified      Family Dynamics   Good Support System? Yes   family     Barriers   Psychosocial barriers to participate in program There are no identifiable barriers or psychosocial needs.;The patient should benefit from training in stress management and relaxation.      Screening Interventions   Interventions Encouraged to exercise;Provide  feedback about the scores to participant;To provide support and resources with identified psychosocial needs    Expected Outcomes Short Term goal: Utilizing psychosocial counselor, staff and physician to assist with identification of specific Stressors or current issues interfering with healing process. Setting desired goal for each stressor or current issue identified.;Long Term Goal: Stressors or current issues are controlled or eliminated.;Short Term goal: Identification and review with participant of any Quality of Life or  Depression concerns found by scoring the questionnaire.;Long Term goal: The participant improves quality of Life and PHQ9 Scores as seen by post scores and/or verbalization of changes           Quality of Life Scores:   Quality of Life - 03/24/21 1245      Quality of Life   Select Quality of Life      Quality of Life Scores   Health/Function Pre 26.26 %    Socioeconomic Pre 29.5 %    Psych/Spiritual Pre 28.8 %    Family Pre 27.6 %    GLOBAL Pre 27.78 %          Scores of 19 and below usually indicate a poorer quality of life in these areas.  A difference of  2-3 points is a clinically meaningful difference.  A difference of 2-3 points in the total score of the Quality of Life Index has been associated with significant improvement in overall quality of life, self-image, physical symptoms, and general health in studies assessing change in quality of life.  PHQ-9: Recent Review Flowsheet Data    Depression screen [facility] 2/9 03/24/2021   Decreased Interest 0   Down, Depressed, Hopeless 0   PHQ - 2 Score 0   Altered sleeping 0   Tired, decreased energy 0   Change in appetite 0   Feeling bad or failure about yourself  0   Trouble concentrating 0   Moving slowly or fidgety/restless 0   Suicidal thoughts 0   PHQ-9 Score 0   Difficult doing work/chores Not difficult at all     Interpretation of Total Score  Total Score Depression Severity:  1-4 = Minimal  depression, 5-9 = Mild depression, 10-14 = Moderate depression, 15-19 = Moderately severe depression, 20-27 = Severe depression   Psychosocial Evaluation and Intervention:  Psychosocial Evaluation - 03/18/21 1053      Psychosocial Evaluation & Interventions   Interventions Encouraged to exercise with the program and follow exercise prescription    Comments [name] reports doing well post CABG and NSTEMI in October of 2021. He said he is eating and sleeping well. He is still doing what he wants to do during the day. He does have a copay which will most likely hinder the length of his attendance. While he is in the program he wants to work on his diabetes and maintaining his independence.    Expected Outcomes Short: attend cardiac rehab for education and exercise. Long: develop positive self care habits.    Continue Psychosocial Services  Follow up required by staff           Psychosocial Re-Evaluation:   Psychosocial Discharge (Final Psychosocial Re-Evaluation):   Vocational Rehabilitation: Provide vocational rehab assistance to qualifying candidates.   Vocational Rehab Evaluation & Intervention:  Vocational Rehab - 03/18/21 1053      Initial Vocational Rehab Evaluation & Intervention   Assessment shows need for Vocational Rehabilitation No           Education: Education Goals: Education classes will be provided on a variety of topics geared toward better understanding of heart health and risk factor modification. Participant will state understanding/return demonstration of topics presented as noted by education test scores.  Learning Barriers/Preferences:  Learning Barriers/Preferences - 03/18/21 1040      Learning Barriers/Preferences   Learning Barriers None    Learning Preferences None           General Cardiac Education Topics:  AED/CPR: - Group verbal  and written instruction with the use of models to demonstrate the basic use of the AED with the basic ABC's  of resuscitation.   Anatomy and Cardiac Procedures: - Group verbal and visual presentation and models provide information about basic cardiac anatomy and function. Reviews the testing methods done to diagnose heart disease and the outcomes of the test results. Describes the treatment choices: Medical Management, Angioplasty, or Coronary Bypass Surgery for treating various heart conditions including Myocardial Infarction, Angina, Valve Disease, and Cardiac Arrhythmias.  Written material given at graduation. Flowsheet Row Cardiac Rehab from 03/24/2021 in [facility] Cardiac and Pulmonary Rehab  Education need identified 03/24/21      Medication Safety: - Group verbal and visual instruction to review commonly prescribed medications for heart and lung disease. Reviews the medication, class of the drug, and side effects. Includes the steps to properly store meds and maintain the prescription regimen.  Written material given at graduation.   Intimacy: - Group verbal instruction through game format to discuss how heart and lung disease can affect sexual intimacy. Written material given at graduation..   Know Your Numbers and Heart Failure: - Group verbal and visual instruction to discuss disease risk factors for cardiac and pulmonary disease and treatment options.  Reviews associated critical values for Overweight/Obesity, Hypertension, Cholesterol, and Diabetes.  Discusses basics of heart failure: signs/symptoms and treatments.  Introduces Heart Failure Zone chart for action plan for heart failure.  Written material given at graduation.   Infection Prevention: - Provides verbal and written material to individual with discussion of infection control including proper hand washing and proper equipment cleaning during exercise session. Flowsheet Row Cardiac Rehab from 03/24/2021 in [facility] Cardiac and Pulmonary Rehab  Date 03/24/21  Educator [facility]  Instruction Review Code 1- Verbalizes Understanding      Falls  Prevention: - Provides verbal and written material to individual with discussion of falls prevention and safety. Flowsheet Row Cardiac Rehab from 03/24/2021 in [facility] Cardiac and Pulmonary Rehab  Date 03/24/21  Educator [facility]  Instruction Review Code 1- Verbalizes Understanding      Other: -Provides group and verbal instruction on various topics (see comments)   Knowledge Questionnaire Score:  Knowledge Questionnaire Score - 03/24/21 1246      Knowledge Questionnaire Score   Pre Score 19/26 Education Focus: nutrition, exercise, angina, cessation           Core Components/Risk Factors/Patient Goals at Admission:  Personal Goals and Risk Factors at Admission - 03/24/21 1246      Core Components/Risk Factors/Patient Goals on Admission    Weight Management Yes;Obesity;Weight Loss    Intervention Weight Management: Develop a combined nutrition and exercise program designed to reach desired caloric intake, while maintaining appropriate intake of nutrient and fiber, sodium and fats, and appropriate energy expenditure required for the weight goal.;Weight Management: Provide education and appropriate resources to help participant work on and attain dietary goals.;Weight Management/Obesity: Establish reasonable short term and long term weight goals.;Obesity: Provide education and appropriate resources to help participant work on and attain dietary goals.    Admit Weight 228 lb 1.6 oz (103.5 kg)    Goal Weight: Short Term 223 lb (101.2 kg)    Goal Weight: Long Term 220 lb (99.8 kg)    Expected Outcomes Short Term: Continue to assess and modify interventions until short term weight is achieved;Long Term: Adherence to nutrition and physical activity/exercise program aimed toward attainment of established weight goal;Weight Loss: Understanding of general recommendations for a balanced deficit meal plan,  which promotes 1-2 lb weight loss per week and includes a negative energy balance of [phone]  kcal/d;Understanding recommendations for meals to include 15-35% energy as protein, 25-35% energy from fat, 35-60% energy from carbohydrates, less than 200mg  of dietary cholesterol, 20-35 gm of total fiber daily;Understanding of distribution of calorie intake throughout the day with the consumption of 4-5 meals/snacks    Diabetes Yes    Intervention Provide education about signs/symptoms and action to take for hypo/hyperglycemia.;Provide education about proper nutrition, including hydration, and aerobic/resistive exercise prescription along with prescribed medications to achieve blood glucose in normal ranges: Fasting glucose 65-99 mg/dL    Expected Outcomes Short Term: Participant verbalizes understanding of the signs/symptoms and immediate care of hyper/hypoglycemia, proper foot care and importance of medication, aerobic/resistive exercise and nutrition plan for blood glucose control.;Long Term: Attainment of HbA1C < 7%.    Hypertension Yes    Intervention Provide education on lifestyle modifcations including regular physical activity/exercise, weight management, moderate sodium restriction and increased consumption of fresh fruit, vegetables, and low fat dairy, alcohol moderation, and smoking cessation.;Monitor prescription use compliance.    Expected Outcomes Short Term: Continued assessment and intervention until BP is < 140/50mm HG in hypertensive participants. < 130/56mm HG in hypertensive participants with diabetes, heart failure or chronic kidney disease.;Long Term: Maintenance of blood pressure at goal levels.    Lipids Yes    Intervention Provide education and support for participant on nutrition & aerobic/resistive exercise along with prescribed medications to achieve LDL 70mg , HDL >40mg .    Expected Outcomes Short Term: Participant states understanding of desired cholesterol values and is compliant with medications prescribed. Participant is following exercise prescription and nutrition  guidelines.;Long Term: Cholesterol controlled with medications as prescribed, with individualized exercise RX and with personalized nutrition plan. Value goals: LDL < 70mg , HDL > 40 mg.           Education:Diabetes - Individual verbal and written instruction to review signs/symptoms of diabetes, desired ranges of glucose level fasting, after meals and with exercise. Acknowledge that pre and post exercise glucose checks will be done for 3 sessions at entry of program. [place] from 03/24/2021 in [facility]  Date 03/18/21  Educator [facility]  Instruction Review Code 1- Verbalizes Understanding      Core Components/Risk Factors/Patient Goals Review:    Core Components/Risk Factors/Patient Goals at Discharge (Final Review):    ITP Comments:  ITP Comments    Row Name 03/18/21 1059 03/24/21 1123         ITP Comments Initial telephone orientation completed. Diagnosis can be found in [facility] 4/22. EP orientation scheduled for Tuesday 5/17 at 10am. Completed 6MWT and gym orientation. Initial ITP created and sent for review to Dr. Emily Filbert, Medical Director.             Comments: Initial ITP

## 2021-03-24 NOTE — Patient Instructions (Signed)
Patient Instructions  Patient Details  Name: [name] MRN: [id number] Date of Birth: 1935-09-22 Referring Provider:  [name], MD  Below are your personal goals for exercise, nutrition, and risk factors. Our goal is to help you stay on track towards obtaining and maintaining these goals. We will be discussing your progress on these goals with you throughout the program.  Initial Exercise Prescription:  Initial Exercise Prescription - 03/24/21 1100      Date of Initial Exercise RX and Referring Provider   Date 03/24/21    Referring Provider [name] MD      Recumbant Bike   Level 1    RPM 50    Watts 10    Minutes 15    METs 2      NuStep   Level 1    SPM 80    Minutes 15    METs 2      Track   Laps 20    Minutes 15    METs 2.1      Prescription Details   Frequency (times per week) 3    Duration Progress to 30 minutes of continuous aerobic without signs/symptoms of physical distress      Intensity   THRR 40-80% of Max Heartrate 93-122    Ratings of Perceived Exertion 11-13    Perceived Dyspnea 0-4      Progression   Progression Continue to progress workloads to maintain intensity without signs/symptoms of physical distress.      Resistance Training   Training Prescription Yes    Weight 3 lb    Reps 10-15           Exercise Goals: Frequency: Be able to perform aerobic exercise two to three times per week in program working toward 2-5 days per week of home exercise.  Intensity: Work with a perceived exertion of 11 (fairly light) - 15 (hard) while following your exercise prescription.  We will make changes to your prescription with you as you progress through the program.   Duration: Be able to do 30 to 45 minutes of continuous aerobic exercise in addition to a 5 minute warm-up and a 5 minute cool-down routine.   Nutrition Goals: Your personal nutrition goals will be established when you do your nutrition analysis with the  dietician.  The following are general nutrition guidelines to follow: Cholesterol < 200mg /day Sodium < 1500mg /day Fiber: Men over 50 yrs - 30 grams per day  Personal Goals:  Personal Goals and Risk Factors at Admission - 03/24/21 1246      Core Components/Risk Factors/Patient Goals on Admission    Weight Management Yes;Obesity;Weight Loss    Intervention Weight Management: Develop a combined nutrition and exercise program designed to reach desired caloric intake, while maintaining appropriate intake of nutrient and fiber, sodium and fats, and appropriate energy expenditure required for the weight goal.;Weight Management: Provide education and appropriate resources to help participant work on and attain dietary goals.;Weight Management/Obesity: Establish reasonable short term and long term weight goals.;Obesity: Provide education and appropriate resources to help participant work on and attain dietary goals.    Admit Weight 228 lb 1.6 oz (103.5 kg)    Goal Weight: Short Term 223 lb (101.2 kg)    Goal Weight: Long Term 220 lb (99.8 kg)    Expected Outcomes Short Term: Continue to assess and modify interventions until short term weight is achieved;Long Term: Adherence to nutrition and physical activity/exercise program aimed toward attainment of established weight goal;Weight Loss:  Understanding of general recommendations for a balanced deficit meal plan, which promotes 1-2 lb weight loss per week and includes a negative energy balance of [phone] kcal/d;Understanding recommendations for meals to include 15-35% energy as protein, 25-35% energy from fat, 35-60% energy from carbohydrates, less than 200mg  of dietary cholesterol, 20-35 gm of total fiber daily;Understanding of distribution of calorie intake throughout the day with the consumption of 4-5 meals/snacks    Diabetes Yes    Intervention Provide education about signs/symptoms and action to take for hypo/hyperglycemia.;Provide education about  proper nutrition, including hydration, and aerobic/resistive exercise prescription along with prescribed medications to achieve blood glucose in normal ranges: Fasting glucose 65-99 mg/dL    Expected Outcomes Short Term: Participant verbalizes understanding of the signs/symptoms and immediate care of hyper/hypoglycemia, proper foot care and importance of medication, aerobic/resistive exercise and nutrition plan for blood glucose control.;Long Term: Attainment of HbA1C < 7%.    Hypertension Yes    Intervention Provide education on lifestyle modifcations including regular physical activity/exercise, weight management, moderate sodium restriction and increased consumption of fresh fruit, vegetables, and low fat dairy, alcohol moderation, and smoking cessation.;Monitor prescription use compliance.    Expected Outcomes Short Term: Continued assessment and intervention until BP is < 140/2mm HG in hypertensive participants. < 130/35mm HG in hypertensive participants with diabetes, heart failure or chronic kidney disease.;Long Term: Maintenance of blood pressure at goal levels.    Lipids Yes    Intervention Provide education and support for participant on nutrition & aerobic/resistive exercise along with prescribed medications to achieve LDL 70mg , HDL >40mg .    Expected Outcomes Short Term: Participant states understanding of desired cholesterol values and is compliant with medications prescribed. Participant is following exercise prescription and nutrition guidelines.;Long Term: Cholesterol controlled with medications as prescribed, with individualized exercise RX and with personalized nutrition plan. Value goals: LDL < 70mg , HDL > 40 mg.           Tobacco Use Initial Evaluation: Social History   Tobacco Use  Smoking Status Former Smoker  . Quit date: 07/26/1996  . Years since quitting: 24.6  Smokeless Tobacco Never Used    Exercise Goals and Review:  Exercise Goals    Row Name 03/24/21 1147              Exercise Goals   Increase Physical Activity Yes       Intervention Provide advice, education, support and counseling about physical activity/exercise needs.;Develop an individualized exercise prescription for aerobic and resistive training based on initial evaluation findings, risk stratification, comorbidities and participant's personal goals.       Expected Outcomes Short Term: Attend rehab on a regular basis to increase amount of physical activity.;Long Term: Add in home exercise to make exercise part of routine and to increase amount of physical activity.;Long Term: Exercising regularly at least 3-5 days a week.       Increase Strength and Stamina Yes       Intervention Provide advice, education, support and counseling about physical activity/exercise needs.;Develop an individualized exercise prescription for aerobic and resistive training based on initial evaluation findings, risk stratification, comorbidities and participant's personal goals.       Expected Outcomes Short Term: Increase workloads from initial exercise prescription for resistance, speed, and METs.;Short Term: Perform resistance training exercises routinely during rehab and add in resistance training at home;Long Term: Improve cardiorespiratory fitness, muscular endurance and strength as measured by increased METs and functional capacity (6MWT)       Able to understand and  use rate of perceived exertion (RPE) scale Yes       Intervention Provide education and explanation on how to use RPE scale       Expected Outcomes Short Term: Able to use RPE daily in rehab to express subjective intensity level;Long Term:  Able to use RPE to guide intensity level when exercising independently       Able to understand and use Dyspnea scale Yes       Intervention Provide education and explanation on how to use Dyspnea scale       Expected Outcomes Short Term: Able to use Dyspnea scale daily in rehab to express subjective sense of  shortness of breath during exertion;Long Term: Able to use Dyspnea scale to guide intensity level when exercising independently       Knowledge and understanding of Target Heart Rate Range (THRR) Yes       Intervention Provide education and explanation of THRR including how the numbers were predicted and where they are located for reference       Expected Outcomes Short Term: Able to state/look up THRR;Long Term: Able to use THRR to govern intensity when exercising independently;Short Term: Able to use daily as guideline for intensity in rehab       Able to check pulse independently Yes       Intervention Provide education and demonstration on how to check pulse in carotid and radial arteries.;Review the importance of being able to check your own pulse for safety during independent exercise       Expected Outcomes Short Term: Able to explain why pulse checking is important during independent exercise;Long Term: Able to check pulse independently and accurately       Understanding of Exercise Prescription Yes       Intervention Provide education, explanation, and written materials on patient's individual exercise prescription       Expected Outcomes Short Term: Able to explain program exercise prescription;Long Term: Able to explain home exercise prescription to exercise independently              Copy of goals given to participant.

## 2021-03-25 ENCOUNTER — Encounter: Payer: Self-pay | Admitting: *Deleted

## 2021-03-25 DIAGNOSIS — I214 Non-ST elevation (NSTEMI) myocardial infarction: Secondary | ICD-10-CM

## 2021-03-25 DIAGNOSIS — Z951 Presence of aortocoronary bypass graft: Secondary | ICD-10-CM

## 2021-03-25 NOTE — Progress Notes (Signed)
Cardiac Individual Treatment Plan  Patient Details  Name: [name] MRN: [id number] Date of Birth: [birth year] Referring Provider:   Flowsheet Row Cardiac Rehab from 03/24/2021 in [facility] Cardiac and Pulmonary Rehab  Referring Provider [name] MD      Initial Encounter Date:  Flowsheet Row Cardiac Rehab from 03/24/2021 in [facility] Cardiac and Pulmonary Rehab  Date 03/24/21      Visit Diagnosis: NSTEMI (non-ST elevation myocardial infarction) ([place])  S/P CABG x 5  Patient's Home Medications on Admission:  Current Outpatient Medications:  .  acetaminophen (TYLENOL) 500 MG tablet, Take 500-1,000 mg by mouth every 6 (six) hours as needed for mild pain or moderate pain. , Disp: , Rfl:  .  acyclovir (ZOVIRAX) 800 MG tablet, Take 800 mg by mouth daily., Disp: , Rfl:  .  amLODipine (NORVASC) 10 MG tablet, Take 1 tablet (10 mg total) by mouth daily., Disp: 90 tablet, Rfl: 1 .  aspirin EC 81 MG tablet, Take 81 mg by mouth daily., Disp: , Rfl:  .  clopidogrel (PLAVIX) 75 MG tablet, Take 1 tablet (75 mg total) by mouth daily., Disp: 90 tablet, Rfl: 1 .  fluticasone (FLOVENT HFA) 44 MCG/ACT inhaler, Inhale 2 puffs into the lungs 2 (two) times daily., Disp: , Rfl:  .  furosemide (LASIX) 80 MG tablet, Take 1 tablet (80 mg total) by mouth daily., Disp: 90 tablet, Rfl: 2 .  hydrALAZINE (APRESOLINE) 25 MG tablet, Take 1 tablet (25 mg total) by mouth in the morning and at bedtime., Disp: 180 tablet, Rfl: 3 .  insulin detemir (LEVEMIR) 100 UNIT/ML injection, Inject 0.2 mLs (20 Units total) into the skin 2 (two) times daily., Disp: 10 mL, Rfl: 3 .  magnesium oxide (MAG-OX) 400 MG tablet, Take 1 tablet (400 mg total) by mouth daily., Disp: 90 tablet, Rfl: 1 .  Multiple Vitamin (MULTIVITAMIN WITH MINERALS) TABS tablet, Take 1 tablet by mouth daily., Disp: , Rfl:  .  oxybutynin (DITROPAN) 5 MG tablet, Take 5 mg by mouth 2 (two) times daily., Disp: , Rfl:  .  pantoprazole (PROTONIX) 40 MG tablet, Take 40  mg by mouth 2 (two) times daily., Disp: , Rfl:  .  potassium chloride (KLOR-CON) 10 MEQ tablet, Take 1 tablet (10 mEq total) by mouth daily. Only when taking lasix, Disp: 30 tablet, Rfl: 5 .  rosuvastatin (CRESTOR) 40 MG tablet, Take 1 tablet (40 mg total) by mouth daily., Disp: 90 tablet, Rfl: 1 .  spironolactone (ALDACTONE) 50 MG tablet, Take 1 tablet (50 mg total) by mouth daily., Disp: 90 tablet, Rfl: 1 .  traMADol (ULTRAM) 50 MG tablet, Take 1 tablet (50 mg total) by mouth every 12 (twelve) hours as needed for severe pain. Further refill request will need to be sent to primary care., Disp: 30 tablet, Rfl: 0  Past Medical History: Past Medical History:  Diagnosis Date  . Diabetes mellitus without complication ([place])   . Hypertension     Tobacco Use: Social History   Tobacco Use  Smoking Status Former Smoker  . Quit date: 07/26/1996  . Years since quitting: 24.6  Smokeless Tobacco Never Used    Labs: Recent Chemical engineer    Labs for ITP Cardiac and Pulmonary Rehab Latest Ref Rng & Units 08/29/2020 08/30/2020 08/30/2020 08/31/2020 09/01/2020   Cholestrol 0 - 200 mg/dL - - - - -   LDLCALC 0 - 99 mg/dL - - - - -   HDL >40 mg/dL - - - - -  Trlycerides <150 mg/dL - - - - -   Hemoglobin A1c 4.8 - 5.6 % - - - - -   PHART 7.350 - 7.450 7.342(L) 7.370 7.345(L) - -   PCO2ART 32.0 - 48.0 mmHg 35.8 35.6 36.1 - -   HCO3 20.0 - 28.0 mmol/L 19.7(L) 20.7 19.9(L) - -   TCO2 22 - 32 mmol/L 21(L) 22 21(L) - -   ACIDBASEDEF 0.0 - 2.0 mmol/L 6.0(H) 4.0(H) 5.0(H) - -   O2SAT % 90.0 94.0 96.0 75.9 70.0       Exercise Target Goals: Exercise Program Goal: Individual exercise prescription set using results from initial 6 min walk test and THRR while considering  patient's activity barriers and safety.   Exercise Prescription Goal: Initial exercise prescription builds to 30-45 minutes a day of aerobic activity, 2-3 days per week.  Home exercise guidelines will be given to patient during  program as part of exercise prescription that the participant will acknowledge.   Education: Aerobic Exercise: - Group verbal and visual presentation on the components of exercise prescription. Introduces F.I.T.T principle from ACSM for exercise prescriptions.  Reviews F.I.T.T. principles of aerobic exercise including progression. Written material given at graduation. Flowsheet Row Cardiac Rehab from 03/24/2021 in [facility] Cardiac and Pulmonary Rehab  Education need identified 03/24/21      Education: Resistance Exercise: - Group verbal and visual presentation on the components of exercise prescription. Introduces F.I.T.T principle from ACSM for exercise prescriptions  Reviews F.I.T.T. principles of resistance exercise including progression. Written material given at graduation.    Education: Exercise & Equipment Safety: - Individual verbal instruction and demonstration of equipment use and safety with use of the equipment. Flowsheet Row Cardiac Rehab from 03/24/2021 in [facility] Cardiac and Pulmonary Rehab  Date 03/24/21  Educator [facility]  Instruction Review Code 1- Verbalizes Understanding      Education: Exercise Physiology & General Exercise Guidelines: - Group verbal and written instruction with models to review the exercise physiology of the cardiovascular system and associated critical values. Provides general exercise guidelines with specific guidelines to those with heart or lung disease.    Education: Flexibility, Balance, Mind/Body Relaxation: - Group verbal and visual presentation with interactive activity on the components of exercise prescription. Introduces F.I.T.T principle from ACSM for exercise prescriptions. Reviews F.I.T.T. principles of flexibility and balance exercise training including progression. Also discusses the mind body connection.  Reviews various relaxation techniques to help reduce and manage stress (i.e. Deep breathing, progressive muscle relaxation, and visualization).  Balance handout provided to take home. Written material given at graduation.   Activity Barriers & Risk Stratification:  Activity Barriers & Cardiac Risk Stratification - 03/24/21 1144      Activity Barriers & Cardiac Risk Stratification   Activity Barriers Joint Problems;Deconditioning;Muscular Weakness;Shortness of Breath;Balance Concerns    Cardiac Risk Stratification High           6 Minute Walk:  6 Minute Walk    Row Name 03/24/21 1144         6 Minute Walk   Phase Initial     Distance 800 feet     Walk Time 6 minutes     # of Rest Breaks 0     MPH 1.52     METS 1.54     RPE 13     Perceived Dyspnea  2     VO2 Peak 5.39     Symptoms Yes (comment)     Comments SOB, fatigue     Resting HR  63 bpm     Resting BP 142/80     Resting Oxygen Saturation  97 %     Exercise Oxygen Saturation  during 6 min walk 98 %     Max Ex. HR 109 bpm     Max Ex. BP 194/96     2 Minute Post BP 150/82            Oxygen Initial Assessment:   Oxygen Re-Evaluation:   Oxygen Discharge (Final Oxygen Re-Evaluation):   Initial Exercise Prescription:  Initial Exercise Prescription - 03/24/21 1100      Date of Initial Exercise RX and Referring Provider   Date 03/24/21    Referring Provider [name] MD      Recumbant Bike   Level 1    RPM 50    Watts 10    Minutes 15    METs 2      NuStep   Level 1    SPM 80    Minutes 15    METs 2      Track   Laps 20    Minutes 15    METs 2.1      Prescription Details   Frequency (times per week) 3    Duration Progress to 30 minutes of continuous aerobic without signs/symptoms of physical distress      Intensity   THRR 40-80% of Max Heartrate 93-122    Ratings of Perceived Exertion 11-13    Perceived Dyspnea 0-4      Progression   Progression Continue to progress workloads to maintain intensity without signs/symptoms of physical distress.      Resistance Training   Training Prescription Yes    Weight 3 lb    Reps  10-15           Perform Capillary Blood Glucose checks as needed.  Exercise Prescription Changes:  Exercise Prescription Changes    Row Name 03/24/21 1100             Response to Exercise   Blood Pressure (Admit) 142/80       Blood Pressure (Exercise) 194/96       Blood Pressure (Exit) 136/76       Heart Rate (Admit) 63 bpm       Heart Rate (Exercise) 109 bpm       Heart Rate (Exit) 54 bpm       Oxygen Saturation (Admit) 97 %       Oxygen Saturation (Exercise) 98 %       Rating of Perceived Exertion (Exercise) 13       Perceived Dyspnea (Exercise) 2       Symptoms SOB, fatigue       Comments walk test results              Exercise Comments:   Exercise Goals and Review:  Exercise Goals    Row Name 03/24/21 1147             Exercise Goals   Increase Physical Activity Yes       Intervention Provide advice, education, support and counseling about physical activity/exercise needs.;Develop an individualized exercise prescription for aerobic and resistive training based on initial evaluation findings, risk stratification, comorbidities and participant's personal goals.       Expected Outcomes Short Term: Attend rehab on a regular basis to increase amount of physical activity.;Long Term: Add in home exercise to make exercise part of routine and to increase amount of physical activity.;Long Term: Exercising  regularly at least 3-5 days a week.       Increase Strength and Stamina Yes       Intervention Provide advice, education, support and counseling about physical activity/exercise needs.;Develop an individualized exercise prescription for aerobic and resistive training based on initial evaluation findings, risk stratification, comorbidities and participant's personal goals.       Expected Outcomes Short Term: Increase workloads from initial exercise prescription for resistance, speed, and METs.;Short Term: Perform resistance training exercises routinely during rehab and add  in resistance training at home;Long Term: Improve cardiorespiratory fitness, muscular endurance and strength as measured by increased METs and functional capacity (6MWT)       Able to understand and use rate of perceived exertion (RPE) scale Yes       Intervention Provide education and explanation on how to use RPE scale       Expected Outcomes Short Term: Able to use RPE daily in rehab to express subjective intensity level;Long Term:  Able to use RPE to guide intensity level when exercising independently       Able to understand and use Dyspnea scale Yes       Intervention Provide education and explanation on how to use Dyspnea scale       Expected Outcomes Short Term: Able to use Dyspnea scale daily in rehab to express subjective sense of shortness of breath during exertion;Long Term: Able to use Dyspnea scale to guide intensity level when exercising independently       Knowledge and understanding of Target Heart Rate Range (THRR) Yes       Intervention Provide education and explanation of THRR including how the numbers were predicted and where they are located for reference       Expected Outcomes Short Term: Able to state/look up THRR;Long Term: Able to use THRR to govern intensity when exercising independently;Short Term: Able to use daily as guideline for intensity in rehab       Able to check pulse independently Yes       Intervention Provide education and demonstration on how to check pulse in carotid and radial arteries.;Review the importance of being able to check your own pulse for safety during independent exercise       Expected Outcomes Short Term: Able to explain why pulse checking is important during independent exercise;Long Term: Able to check pulse independently and accurately       Understanding of Exercise Prescription Yes       Intervention Provide education, explanation, and written materials on patient's individual exercise prescription       Expected Outcomes Short Term: Able  to explain program exercise prescription;Long Term: Able to explain home exercise prescription to exercise independently              Exercise Goals Re-Evaluation :   Discharge Exercise Prescription (Final Exercise Prescription Changes):  Exercise Prescription Changes - 03/24/21 1100      Response to Exercise   Blood Pressure (Admit) 142/80    Blood Pressure (Exercise) 194/96    Blood Pressure (Exit) 136/76    Heart Rate (Admit) 63 bpm    Heart Rate (Exercise) 109 bpm    Heart Rate (Exit) 54 bpm    Oxygen Saturation (Admit) 97 %    Oxygen Saturation (Exercise) 98 %    Rating of Perceived Exertion (Exercise) 13    Perceived Dyspnea (Exercise) 2    Symptoms SOB, fatigue    Comments walk test results  Nutrition:  Target Goals: Understanding of nutrition guidelines, daily intake of sodium 1500mg , cholesterol 200mg , calories 30% from fat and 7% or less from saturated fats, daily to have 5 or more servings of fruits and vegetables.  Education: All About Nutrition: -Group instruction provided by verbal, written material, interactive activities, discussions, models, and posters to present general guidelines for heart healthy nutrition including fat, fiber, MyPlate, the role of sodium in heart healthy nutrition, utilization of the nutrition label, and utilization of this knowledge for meal planning. Follow up email sent as well. Written material given at graduation. Flowsheet Row Cardiac Rehab from 03/24/2021 in [facility] Cardiac and Pulmonary Rehab  Education need identified 03/24/21      Biometrics:  Pre Biometrics - 03/24/21 1244      Pre Biometrics   Height 5' 8.6" (1.742 m)    Weight 228 lb 1.6 oz (103.5 kg)    BMI (Calculated) 34.1    Single Leg Stand 1.2 seconds            Nutrition Therapy Plan and Nutrition Goals:   Nutrition Assessments:  MEDIFICTS Score Key:  ?70 Need to make dietary changes   40-70 Heart Healthy Diet  ? 40 Therapeutic Level  Cholesterol Diet  Flowsheet Row Cardiac Rehab from 03/24/2021 in [facility] Cardiac and Pulmonary Rehab  Picture Your Plate Total Score on Admission 80     Picture Your Plate Scores:  [id number] Unhealthy dietary pattern with much room for improvement.  41-50 Dietary pattern unlikely to meet recommendations for good health and room for improvement.  51-60 More healthful dietary pattern, with some room for improvement.   >60 Healthy dietary pattern, although there may be some specific behaviors that could be improved.    Nutrition Goals Re-Evaluation:   Nutrition Goals Discharge (Final Nutrition Goals Re-Evaluation):   Psychosocial: Target Goals: Acknowledge presence or absence of significant depression and/or stress, maximize coping skills, provide positive support system. Participant is able to verbalize types and ability to use techniques and skills needed for reducing stress and depression.   Education: Stress, Anxiety, and Depression - Group verbal and visual presentation to define topics covered.  Reviews how body is impacted by stress, anxiety, and depression.  Also discusses healthy ways to reduce stress and to treat/manage anxiety and depression.  Written material given at graduation.   Education: Sleep Hygiene -Provides group verbal and written instruction about how sleep can affect your health.  Define sleep hygiene, discuss sleep cycles and impact of sleep habits. Review good sleep hygiene tips.    Initial Review & Psychosocial Screening:  Initial Psych Review & Screening - 03/18/21 1039      Initial Review   Current issues with None Identified      Family Dynamics   Good Support System? Yes   family     Barriers   Psychosocial barriers to participate in program There are no identifiable barriers or psychosocial needs.;The patient should benefit from training in stress management and relaxation.      Screening Interventions   Interventions Encouraged to exercise;Provide  feedback about the scores to participant;To provide support and resources with identified psychosocial needs    Expected Outcomes Short Term goal: Utilizing psychosocial counselor, staff and physician to assist with identification of specific Stressors or current issues interfering with healing process. Setting desired goal for each stressor or current issue identified.;Long Term Goal: Stressors or current issues are controlled or eliminated.;Short Term goal: Identification and review with participant of any Quality of Life or  Depression concerns found by scoring the questionnaire.;Long Term goal: The participant improves quality of Life and PHQ9 Scores as seen by post scores and/or verbalization of changes           Quality of Life Scores:   Quality of Life - 03/24/21 1245      Quality of Life   Select Quality of Life      Quality of Life Scores   Health/Function Pre 26.26 %    Socioeconomic Pre 29.5 %    Psych/Spiritual Pre 28.8 %    Family Pre 27.6 %    GLOBAL Pre 27.78 %          Scores of 19 and below usually indicate a poorer quality of life in these areas.  A difference of  2-3 points is a clinically meaningful difference.  A difference of 2-3 points in the total score of the Quality of Life Index has been associated with significant improvement in overall quality of life, self-image, physical symptoms, and general health in studies assessing change in quality of life.  PHQ-9: Recent Review Flowsheet Data    Depression screen [facility] 2/9 03/24/2021   Decreased Interest 0   Down, Depressed, Hopeless 0   PHQ - 2 Score 0   Altered sleeping 0   Tired, decreased energy 0   Change in appetite 0   Feeling bad or failure about yourself  0   Trouble concentrating 0   Moving slowly or fidgety/restless 0   Suicidal thoughts 0   PHQ-9 Score 0   Difficult doing work/chores Not difficult at all     Interpretation of Total Score  Total Score Depression Severity:  1-4 = Minimal  depression, 5-9 = Mild depression, 10-14 = Moderate depression, 15-19 = Moderately severe depression, 20-27 = Severe depression   Psychosocial Evaluation and Intervention:  Psychosocial Evaluation - 03/18/21 1053      Psychosocial Evaluation & Interventions   Interventions Encouraged to exercise with the program and follow exercise prescription    Comments Mr. Trotti reports doing well post CABG and NSTEMI in October of 2021. He said he is eating and sleeping well. He is still doing what he wants to do during the day. He does have a copay which will most likely hinder the length of his attendance. While he is in the program he wants to work on his diabetes and maintaining his independence.    Expected Outcomes Short: attend cardiac rehab for education and exercise. Long: develop positive self care habits.    Continue Psychosocial Services  Follow up required by staff           Psychosocial Re-Evaluation:   Psychosocial Discharge (Final Psychosocial Re-Evaluation):   Vocational Rehabilitation: Provide vocational rehab assistance to qualifying candidates.   Vocational Rehab Evaluation & Intervention:  Vocational Rehab - 03/18/21 1053      Initial Vocational Rehab Evaluation & Intervention   Assessment shows need for Vocational Rehabilitation No           Education: Education Goals: Education classes will be provided on a variety of topics geared toward better understanding of heart health and risk factor modification. Participant will state understanding/return demonstration of topics presented as noted by education test scores.  Learning Barriers/Preferences:  Learning Barriers/Preferences - 03/18/21 1040      Learning Barriers/Preferences   Learning Barriers None    Learning Preferences None           General Cardiac Education Topics:  AED/CPR: - Group verbal  and written instruction with the use of models to demonstrate the basic use of the AED with the basic ABC's  of resuscitation.   Anatomy and Cardiac Procedures: - Group verbal and visual presentation and models provide information about basic cardiac anatomy and function. Reviews the testing methods done to diagnose heart disease and the outcomes of the test results. Describes the treatment choices: Medical Management, Angioplasty, or Coronary Bypass Surgery for treating various heart conditions including Myocardial Infarction, Angina, Valve Disease, and Cardiac Arrhythmias.  Written material given at graduation. Flowsheet Row Cardiac Rehab from 03/24/2021 in [facility] Cardiac and Pulmonary Rehab  Education need identified 03/24/21      Medication Safety: - Group verbal and visual instruction to review commonly prescribed medications for heart and lung disease. Reviews the medication, class of the drug, and side effects. Includes the steps to properly store meds and maintain the prescription regimen.  Written material given at graduation.   Intimacy: - Group verbal instruction through game format to discuss how heart and lung disease can affect sexual intimacy. Written material given at graduation..   Know Your Numbers and Heart Failure: - Group verbal and visual instruction to discuss disease risk factors for cardiac and pulmonary disease and treatment options.  Reviews associated critical values for Overweight/Obesity, Hypertension, Cholesterol, and Diabetes.  Discusses basics of heart failure: signs/symptoms and treatments.  Introduces Heart Failure Zone chart for action plan for heart failure.  Written material given at graduation.   Infection Prevention: - Provides verbal and written material to individual with discussion of infection control including proper hand washing and proper equipment cleaning during exercise session. Flowsheet Row Cardiac Rehab from 03/24/2021 in [facility] Cardiac and Pulmonary Rehab  Date 03/24/21  Educator [facility]  Instruction Review Code 1- Verbalizes Understanding      Falls  Prevention: - Provides verbal and written material to individual with discussion of falls prevention and safety. Flowsheet Row Cardiac Rehab from 03/24/2021 in [facility] Cardiac and Pulmonary Rehab  Date 03/24/21  Educator [facility]  Instruction Review Code 1- Verbalizes Understanding      Other: -Provides group and verbal instruction on various topics (see comments)   Knowledge Questionnaire Score:  Knowledge Questionnaire Score - 03/24/21 1246      Knowledge Questionnaire Score   Pre Score 19/26 Education Focus: nutrition, exercise, angina, cessation           Core Components/Risk Factors/Patient Goals at Admission:  Personal Goals and Risk Factors at Admission - 03/24/21 1246      Core Components/Risk Factors/Patient Goals on Admission    Weight Management Yes;Obesity;Weight Loss    Intervention Weight Management: Develop a combined nutrition and exercise program designed to reach desired caloric intake, while maintaining appropriate intake of nutrient and fiber, sodium and fats, and appropriate energy expenditure required for the weight goal.;Weight Management: Provide education and appropriate resources to help participant work on and attain dietary goals.;Weight Management/Obesity: Establish reasonable short term and long term weight goals.;Obesity: Provide education and appropriate resources to help participant work on and attain dietary goals.    Admit Weight 228 lb 1.6 oz (103.5 kg)    Goal Weight: Short Term 223 lb (101.2 kg)    Goal Weight: Long Term 220 lb (99.8 kg)    Expected Outcomes Short Term: Continue to assess and modify interventions until short term weight is achieved;Long Term: Adherence to nutrition and physical activity/exercise program aimed toward attainment of established weight goal;Weight Loss: Understanding of general recommendations for a balanced deficit meal plan,  which promotes 1-2 lb weight loss per week and includes a negative energy balance of [phone]  kcal/d;Understanding recommendations for meals to include 15-35% energy as protein, 25-35% energy from fat, 35-60% energy from carbohydrates, less than 200mg  of dietary cholesterol, 20-35 gm of total fiber daily;Understanding of distribution of calorie intake throughout the day with the consumption of 4-5 meals/snacks    Diabetes Yes    Intervention Provide education about signs/symptoms and action to take for hypo/hyperglycemia.;Provide education about proper nutrition, including hydration, and aerobic/resistive exercise prescription along with prescribed medications to achieve blood glucose in normal ranges: Fasting glucose 65-99 mg/dL    Expected Outcomes Short Term: Participant verbalizes understanding of the signs/symptoms and immediate care of hyper/hypoglycemia, proper foot care and importance of medication, aerobic/resistive exercise and nutrition plan for blood glucose control.;Long Term: Attainment of HbA1C < 7%.    Hypertension Yes    Intervention Provide education on lifestyle modifcations including regular physical activity/exercise, weight management, moderate sodium restriction and increased consumption of fresh fruit, vegetables, and low fat dairy, alcohol moderation, and smoking cessation.;Monitor prescription use compliance.    Expected Outcomes Short Term: Continued assessment and intervention until BP is < 140/25mm HG in hypertensive participants. < 130/72mm HG in hypertensive participants with diabetes, heart failure or chronic kidney disease.;Long Term: Maintenance of blood pressure at goal levels.    Lipids Yes    Intervention Provide education and support for participant on nutrition & aerobic/resistive exercise along with prescribed medications to achieve LDL 70mg , HDL >40mg .    Expected Outcomes Short Term: Participant states understanding of desired cholesterol values and is compliant with medications prescribed. Participant is following exercise prescription and nutrition  guidelines.;Long Term: Cholesterol controlled with medications as prescribed, with individualized exercise RX and with personalized nutrition plan. Value goals: LDL < 70mg , HDL > 40 mg.           Education:Diabetes - Individual verbal and written instruction to review signs/symptoms of diabetes, desired ranges of glucose level fasting, after meals and with exercise. Acknowledge that pre and post exercise glucose checks will be done for 3 sessions at entry of program. [place] from 03/24/2021 in [facility] Cardiac and Pulmonary Rehab  Date 03/18/21  Educator [facility]  Instruction Review Code 1- Verbalizes Understanding      Core Components/Risk Factors/Patient Goals Review:    Core Components/Risk Factors/Patient Goals at Discharge (Final Review):    ITP Comments:  ITP Comments    Row Name 03/18/21 1059 03/24/21 1123 03/25/21 0719       ITP Comments Initial telephone orientation completed. Diagnosis can be found in [facility] 4[facility]. EP orientation scheduled for Tuesday 5/17 at 10am. Completed 6MWT and gym orientation. Initial ITP created and sent for review to Dr. Emily Filbert, Medical Director. 30 Day review completed. Medical Director ITP review done, changes made as directed, and signed approval by Medical Director.   New to program            Comments:

## 2021-03-30 ENCOUNTER — Other Ambulatory Visit: Payer: Self-pay

## 2021-03-30 ENCOUNTER — Encounter: Payer: Medicare HMO | Admitting: *Deleted

## 2021-03-30 DIAGNOSIS — Z48812 Encounter for surgical aftercare following surgery on the circulatory system: Secondary | ICD-10-CM | POA: Diagnosis not present

## 2021-03-30 DIAGNOSIS — I214 Non-ST elevation (NSTEMI) myocardial infarction: Secondary | ICD-10-CM

## 2021-03-30 DIAGNOSIS — Z951 Presence of aortocoronary bypass graft: Secondary | ICD-10-CM

## 2021-03-30 LAB — GLUCOSE, CAPILLARY
Glucose-Capillary: 163 mg/dL — ABNORMAL HIGH (ref 70–99)
Glucose-Capillary: 141 mg/dL — ABNORMAL HIGH (ref 70–99)

## 2021-03-30 NOTE — Progress Notes (Signed)
Daily Session Note  Patient Details  Name: [name] MRN: [id number] Date of Birth: Apr 01, 1935 Referring Provider:   Flowsheet Row Cardiac Rehab from 03/24/2021 in [facility] Cardiac and Pulmonary Rehab  Referring Provider [name] MD      Encounter Date: 03/30/2021  Check In:  Session Check In - 03/30/21 [place]      Check-In   Supervising physician immediately available to respond to emergencies See telemetry face sheet for immediately available ER MD    Location [facility]-Cardiac & Pulmonary Rehab    Staff Present Renita Papa, RN Moises Blood, BS, ACSM CEP, Exercise Physiologist;Kara Eliezer Bottom, MS, ASCM CEP, Exercise Physiologist    Virtual Visit No    Medication changes reported     No    Fall or balance concerns reported    No    Warm-up and Cool-down Performed on first and last piece of equipment    Resistance Training Performed Yes    VAD Patient? No    PAD/SET Patient? No      Pain Assessment   Currently in Pain? No/denies              Social History   Tobacco Use  Smoking Status Former Smoker  . Quit date: 07/26/1996  . Years since quitting: 24.6  Smokeless Tobacco Never Used    Goals Met:  Independence with exercise equipment Exercise tolerated well No report of cardiac concerns or symptoms Strength training completed today  Goals Unmet:  Not Applicable  Comments: First full day of exercise!  Patient was oriented to gym and equipment including functions, settings, policies, and procedures.  Patient's individual exercise prescription and treatment plan were reviewed.  All starting workloads were established based on the results of the 6 minute walk test done at initial orientation visit.  The plan for exercise progression was also introduced and progression will be customized based on patient's performance and goals.    Dr. Emily Filbert is Medical Director for [facility].  Dr. Ottie Glazier is Medical Director for [facility]  Pulmonary Rehabilitation.

## 2021-04-01 ENCOUNTER — Other Ambulatory Visit: Payer: Self-pay

## 2021-04-01 DIAGNOSIS — I214 Non-ST elevation (NSTEMI) myocardial infarction: Secondary | ICD-10-CM

## 2021-04-01 DIAGNOSIS — Z951 Presence of aortocoronary bypass graft: Secondary | ICD-10-CM

## 2021-04-01 DIAGNOSIS — Z48812 Encounter for surgical aftercare following surgery on the circulatory system: Secondary | ICD-10-CM | POA: Diagnosis not present

## 2021-04-01 LAB — GLUCOSE, CAPILLARY
Glucose-Capillary: 140 mg/dL — ABNORMAL HIGH (ref 70–99)
Glucose-Capillary: 129 mg/dL — ABNORMAL HIGH (ref 70–99)

## 2021-04-01 NOTE — Progress Notes (Signed)
Daily Session Note  Patient Details  Name: [name] MRN: [id number] Date of Birth: Jun 22, 1935 Referring Provider:   Flowsheet Row Cardiac Rehab from 03/24/2021 in [facility] Cardiac and Pulmonary Rehab  Referring Provider [name] MD      Encounter Date: 04/01/2021  Check In:  Session Check In - 04/01/21 1702      Check-In   Supervising physician immediately available to respond to emergencies See telemetry face sheet for immediately available ER MD    Location [facility]-Cardiac & Pulmonary Rehab    Staff Present Birdie Sons, MPA, RN;Melissa Caiola RDN, Tawanna Solo, MS, ASCM CEP, Exercise Physiologist;Joseph Tessie Fass RCP,RRT,BSRT    Virtual Visit No    Medication changes reported     No    Fall or balance concerns reported    No    Warm-up and Cool-down Performed on first and last piece of equipment    Resistance Training Performed Yes    VAD Patient? No    PAD/SET Patient? No      Pain Assessment   Currently in Pain? No/denies              Social History   Tobacco Use  Smoking Status Former Smoker  . Quit date: 07/26/1996  . Years since quitting: 24.6  Smokeless Tobacco Never Used    Goals Met:  Independence with exercise equipment Exercise tolerated well No report of cardiac concerns or symptoms Strength training completed today  Goals Unmet:  Not Applicable  Comments: Pt able to follow exercise prescription today without complaint.  Will continue to monitor for progression.    Dr. Emily Filbert is Medical Director for [place].  Dr. Ottie Glazier is Medical Director for [facility] Pulmonary Rehabilitation.

## 2021-04-02 ENCOUNTER — Encounter: Payer: Medicare HMO | Admitting: *Deleted

## 2021-04-02 DIAGNOSIS — I214 Non-ST elevation (NSTEMI) myocardial infarction: Secondary | ICD-10-CM

## 2021-04-02 DIAGNOSIS — Z951 Presence of aortocoronary bypass graft: Secondary | ICD-10-CM

## 2021-04-02 DIAGNOSIS — Z48812 Encounter for surgical aftercare following surgery on the circulatory system: Secondary | ICD-10-CM | POA: Diagnosis not present

## 2021-04-02 LAB — GLUCOSE, CAPILLARY
Glucose-Capillary: 153 mg/dL — ABNORMAL HIGH (ref 70–99)
Glucose-Capillary: 108 mg/dL — ABNORMAL HIGH (ref 70–99)

## 2021-04-02 NOTE — Progress Notes (Signed)
Daily Session Note  Patient Details  Name: [name] MRN: [id number] Date of Birth: 09/27/1935 Referring Provider:   Flowsheet Row Cardiac Rehab from 03/24/2021 in [facility] Cardiac and Pulmonary Rehab  Referring Provider [name] MD      Encounter Date: 04/02/2021  Check In:  Session Check In - 04/02/21 1722      Check-In   Supervising physician immediately available to respond to emergencies See telemetry face sheet for immediately available ER MD    Location [facility]-Cardiac & Pulmonary Rehab    Staff Present Renita Papa, RN BSN;Melissa Caiola RDN, Rowe Pavy, BA, ACSM CEP, Exercise Physiologist    Virtual Visit No    Medication changes reported     No    Fall or balance concerns reported    No    Warm-up and Cool-down Performed on first and last piece of equipment    Resistance Training Performed Yes    VAD Patient? No    PAD/SET Patient? No      Pain Assessment   Currently in Pain? No/denies              Social History   Tobacco Use  Smoking Status Former Smoker  . Quit date: 07/26/1996  . Years since quitting: 24.7  Smokeless Tobacco Never Used    Goals Met:  Independence with exercise equipment Exercise tolerated well No report of cardiac concerns or symptoms Strength training completed today  Goals Unmet:  Not Applicable  Comments: Pt able to follow exercise prescription today without complaint.  Will continue to monitor for progression.    Dr. Emily Filbert is Medical Director for [place].  Dr. Ottie Glazier is Medical Director for [facility] Pulmonary Rehabilitation.

## 2021-04-04 ENCOUNTER — Other Ambulatory Visit: Payer: Self-pay | Admitting: Family

## 2021-04-08 ENCOUNTER — Other Ambulatory Visit: Payer: Self-pay

## 2021-04-08 ENCOUNTER — Encounter: Payer: Medicare HMO | Attending: Cardiovascular Disease | Admitting: *Deleted

## 2021-04-08 DIAGNOSIS — Z951 Presence of aortocoronary bypass graft: Secondary | ICD-10-CM | POA: Insufficient documentation

## 2021-04-08 DIAGNOSIS — I214 Non-ST elevation (NSTEMI) myocardial infarction: Secondary | ICD-10-CM | POA: Insufficient documentation

## 2021-04-08 NOTE — Progress Notes (Signed)
Daily Session Note  Patient Details  Name: [name] MRN: [id number] Date of Birth: 11/27/1934 Referring Provider:   Flowsheet Row Cardiac Rehab from 03/24/2021 in [facility] Cardiac and Pulmonary Rehab  Referring Provider [name] MD      Encounter Date: 04/08/2021  Check In:  Session Check In - 04/08/21 1651      Check-In   Supervising physician immediately available to respond to emergencies See telemetry face sheet for immediately available ER MD    Location [facility]-Cardiac & Pulmonary Rehab    Staff Present Renita Papa, RN BSN;Melissa Caiola RDN, Tawanna Solo, MS, ASCM CEP, Exercise Physiologist;Amanda Oletta Darter, BA, ACSM CEP, Exercise Physiologist    Virtual Visit No    Medication changes reported     No    Fall or balance concerns reported    No    Warm-up and Cool-down Performed on first and last piece of equipment    Resistance Training Performed Yes    VAD Patient? No    PAD/SET Patient? No      Pain Assessment   Currently in Pain? No/denies              Social History   Tobacco Use  Smoking Status Former Smoker  . Quit date: 07/26/1996  . Years since quitting: 24.7  Smokeless Tobacco Never Used    Goals Met:  Independence with exercise equipment Exercise tolerated well No report of cardiac concerns or symptoms Strength training completed today  Goals Unmet:  Not Applicable  Comments: Pt able to follow exercise prescription today without complaint.  Will continue to monitor for progression.    Dr. Emily Filbert is Medical Director for [place].  Dr. Ottie Glazier is Medical Director for [facility] Pulmonary Rehabilitation.

## 2021-04-09 ENCOUNTER — Encounter: Payer: Medicare HMO | Admitting: *Deleted

## 2021-04-09 DIAGNOSIS — I214 Non-ST elevation (NSTEMI) myocardial infarction: Secondary | ICD-10-CM

## 2021-04-09 DIAGNOSIS — Z951 Presence of aortocoronary bypass graft: Secondary | ICD-10-CM

## 2021-04-09 NOTE — Progress Notes (Signed)
Daily Session Note  Patient Details  Name: [name] MRN: [id number] Date of Birth: June 07, 1935 Referring Provider:   Flowsheet Row Cardiac Rehab from 03/24/2021 in [facility] Cardiac and Pulmonary Rehab  Referring Provider [name] MD      Encounter Date: 04/09/2021  Check In:  Session Check In - 04/09/21 1708      Check-In   Supervising physician immediately available to respond to emergencies See telemetry face sheet for immediately available ER MD    Location [facility]-Cardiac & Pulmonary Rehab    Staff Present Renita Papa, RN BSN;Joseph Hood RCP,RRT,BSRT;Amanda Oletta Darter, [place], ACSM CEP, Exercise Physiologist    Virtual Visit No    Medication changes reported     No    Fall or balance concerns reported    No    Warm-up and Cool-down Performed on first and last piece of equipment    Resistance Training Performed Yes    VAD Patient? No    PAD/SET Patient? No      Pain Assessment   Currently in Pain? No/denies              Social History   Tobacco Use  Smoking Status Former Smoker  . Quit date: 07/26/1996  . Years since quitting: 24.7  Smokeless Tobacco Never Used    Goals Met:  Independence with exercise equipment Exercise tolerated well No report of cardiac concerns or symptoms Strength training completed today  Goals Unmet:  Not Applicable  Comments: Pt able to follow exercise prescription today without complaint.  Will continue to monitor for progression.    Dr. Emily Filbert is Medical Director for [place].  Dr. Ottie Glazier is Medical Director for [facility] Pulmonary Rehabilitation.

## 2021-04-13 ENCOUNTER — Other Ambulatory Visit: Payer: Self-pay

## 2021-04-13 ENCOUNTER — Encounter: Payer: Medicare HMO | Admitting: *Deleted

## 2021-04-13 DIAGNOSIS — I214 Non-ST elevation (NSTEMI) myocardial infarction: Secondary | ICD-10-CM | POA: Diagnosis not present

## 2021-04-13 DIAGNOSIS — Z951 Presence of aortocoronary bypass graft: Secondary | ICD-10-CM

## 2021-04-13 NOTE — Progress Notes (Signed)
Daily Session Note  Patient Details  Name: [name] MRN: [id number] Date of Birth: August 18, 1935 Referring Provider:   Flowsheet Row Cardiac Rehab from 03/24/2021 in [facility] Cardiac and Pulmonary Rehab  Referring Provider [name] MD      Encounter Date: 04/13/2021  Check In:  Session Check In - 04/13/21 1756      Check-In   Supervising physician immediately available to respond to emergencies See telemetry face sheet for immediately available ER MD    Location [facility]-Cardiac & Pulmonary Rehab    Staff Present Hope Budds RDN, Wilhelmina Mcardle, BS, ACSM CEP, Exercise Physiologist;Amanda Oletta Darter, BA, ACSM CEP, Exercise Physiologist;Angelette Ganus, RN, BSN, CCRP    Virtual Visit No    Medication changes reported     No    Fall or balance concerns reported    No    Warm-up and Cool-down Performed on first and last piece of equipment    Resistance Training Performed Yes    VAD Patient? No    PAD/SET Patient? No      Pain Assessment   Currently in Pain? No/denies              Social History   Tobacco Use  Smoking Status Former Smoker  . Quit date: 07/26/1996  . Years since quitting: 24.7  Smokeless Tobacco Never Used    Goals Met:  Independence with exercise equipment Exercise tolerated well No report of cardiac concerns or symptoms  Goals Unmet:  Not Applicable  Comments: Pt able to follow exercise prescription today without complaint.  Will continue to monitor for progression.    Dr. Emily Filbert is Medical Director for [place].  Dr. Ottie Glazier is Medical Director for [facility] Pulmonary Rehabilitation.

## 2021-04-15 ENCOUNTER — Other Ambulatory Visit: Payer: Self-pay

## 2021-04-15 DIAGNOSIS — I214 Non-ST elevation (NSTEMI) myocardial infarction: Secondary | ICD-10-CM | POA: Diagnosis not present

## 2021-04-15 DIAGNOSIS — Z951 Presence of aortocoronary bypass graft: Secondary | ICD-10-CM

## 2021-04-15 NOTE — Progress Notes (Signed)
Daily Session Note  Patient Details  Name: [name] MRN: [id number] Date of Birth: [birth year] Referring Provider:   Flowsheet Row Cardiac Rehab from 03/24/2021 in [facility] Cardiac and Pulmonary Rehab  Referring Provider [name] MD      Encounter Date: 04/15/2021  Check In:  Session Check In - 04/15/21 1718      Check-In   Supervising physician immediately available to respond to emergencies See telemetry face sheet for immediately available ER MD    Location [facility]-Cardiac & Pulmonary Rehab    Staff Present Birdie Sons, MPA, RN;Melissa Caiola RDN, LDN;Joseph Lou Miner, MS, ASCM CEP, Exercise Physiologist    Virtual Visit No    Medication changes reported     No    Fall or balance concerns reported    No    Warm-up and Cool-down Performed on first and last piece of equipment    Resistance Training Performed Yes    VAD Patient? No    PAD/SET Patient? No      Pain Assessment   Currently in Pain? No/denies              Social History   Tobacco Use  Smoking Status Former Smoker  . Quit date: 07/26/1996  . Years since quitting: 24.7  Smokeless Tobacco Never Used    Goals Met:  Independence with exercise equipment Exercise tolerated well No report of cardiac concerns or symptoms Strength training completed today  Goals Unmet:  Not Applicable  Comments: Pt able to follow exercise prescription today without complaint.  Will continue to monitor for progression.    Dr. Emily Filbert is Medical Director for [place].  Dr. Ottie Glazier is Medical Director for [facility] Pulmonary Rehabilitation.

## 2021-04-16 DIAGNOSIS — I214 Non-ST elevation (NSTEMI) myocardial infarction: Secondary | ICD-10-CM | POA: Diagnosis not present

## 2021-04-16 DIAGNOSIS — Z951 Presence of aortocoronary bypass graft: Secondary | ICD-10-CM

## 2021-04-16 NOTE — Progress Notes (Signed)
Daily Session Note  Patient Details  Name: [name] MRN: [id number] Date of Birth: [birth year] Referring Provider:   Flowsheet Row Cardiac Rehab from 03/24/2021 in [facility] Cardiac and Pulmonary Rehab  Referring Provider [name] MD       Encounter Date: 04/16/2021  Check In:  Session Check In - 04/16/21 1717       Check-In   Supervising physician immediately available to respond to emergencies See telemetry face sheet for immediately available ER MD    Location [facility]-Cardiac & Pulmonary Rehab    Staff Present Birdie Sons, MPA, RN;Joseph Foy [place], [place], ACSM CEP, Exercise Physiologist    Virtual Visit No    Medication changes reported     No    Fall or balance concerns reported    No    Warm-up and Cool-down Performed on first and last piece of equipment    Resistance Training Performed Yes    VAD Patient? No    PAD/SET Patient? No      Pain Assessment   Currently in Pain? No/denies                Social History   Tobacco Use  Smoking Status Former   Pack years: 0.00   Types: Cigarettes   Quit date: 07/26/1996   Years since quitting: 24.7  Smokeless Tobacco Never    Goals Met:  Independence with exercise equipment Exercise tolerated well Personal goals reviewed No report of cardiac concerns or symptoms Strength training completed today  Goals Unmet:  Not Applicable  Comments: Pt able to follow exercise prescription today without complaint.  Will continue to monitor for progression.    Dr. Emily Filbert is Medical Director for [place].  Dr. Ottie Glazier is Medical Director for [facility] Pulmonary Rehabilitation.

## 2021-04-22 ENCOUNTER — Encounter: Payer: Self-pay | Admitting: *Deleted

## 2021-04-22 ENCOUNTER — Other Ambulatory Visit: Payer: Self-pay

## 2021-04-22 ENCOUNTER — Encounter: Payer: Medicare HMO | Admitting: *Deleted

## 2021-04-22 DIAGNOSIS — Z951 Presence of aortocoronary bypass graft: Secondary | ICD-10-CM

## 2021-04-22 DIAGNOSIS — I214 Non-ST elevation (NSTEMI) myocardial infarction: Secondary | ICD-10-CM

## 2021-04-22 NOTE — Progress Notes (Signed)
Daily Session Note  Patient Details  Name: [name] MRN: [id number] Date of Birth: 1935-08-02 Referring Provider:   Flowsheet Row Cardiac Rehab from 03/24/2021 in [facility] Cardiac and Pulmonary Rehab  Referring Provider [name] MD       Encounter Date: 04/22/2021  Check In:  Session Check In - 04/22/21 [place]       Check-In   Supervising physician immediately available to respond to emergencies See telemetry face sheet for immediately available ER MD    Location [facility]-Cardiac & Pulmonary Rehab    Staff Present Renita Papa, RN BSN;Joseph Lou Miner, [place], ASCM CEP, Exercise Physiologist;Melissa Caiola RDN, LDN    Virtual Visit No    Medication changes reported     No    Fall or balance concerns reported    No    Warm-up and Cool-down Performed on first and last piece of equipment    Resistance Training Performed Yes    VAD Patient? No    PAD/SET Patient? No      Pain Assessment   Currently in Pain? No/denies                Social History   Tobacco Use  Smoking Status Former   Pack years: 0.00   Types: Cigarettes   Quit date: 07/26/1996   Years since quitting: 24.7  Smokeless Tobacco Never    Goals Met:  Independence with exercise equipment Exercise tolerated well No report of cardiac concerns or symptoms Strength training completed today  Goals Unmet:  Not Applicable  Comments: Pt able to follow exercise prescription today without complaint.  Will continue to monitor for progression.    Dr. Emily Filbert is Medical Director for [place].  Dr. Ottie Glazier is Medical Director for [facility] Pulmonary Rehabilitation.

## 2021-04-22 NOTE — Progress Notes (Signed)
Cardiac Individual Treatment Plan  Patient Details  Name: [name] MRN: [id number] Date of Birth: Nov 11, 1934 Referring Provider:   Flowsheet Row Cardiac Rehab from 03/24/2021 in [facility] Cardiac and Pulmonary Rehab  Referring Provider [name] MD       Initial Encounter Date:  Flowsheet Row Cardiac Rehab from 03/24/2021 in [facility] Cardiac and Pulmonary Rehab  Date 03/24/21       Visit Diagnosis: NSTEMI (non-ST elevation myocardial infarction) ([place])  S/P CABG x 5  Patient's Home Medications on Admission:  Current Outpatient Medications:    acetaminophen (TYLENOL) 500 MG tablet, Take 500-1,000 mg by mouth every 6 (six) hours as needed for mild pain or moderate pain. , Disp: , Rfl:    acyclovir (ZOVIRAX) 800 MG tablet, Take 800 mg by mouth daily., Disp: , Rfl:    amLODipine (NORVASC) 10 MG tablet, Take 1 tablet (10 mg total) by mouth daily., Disp: 90 tablet, Rfl: 1   aspirin EC 81 MG tablet, Take 81 mg by mouth daily., Disp: , Rfl:    clopidogrel (PLAVIX) 75 MG tablet, Take 1 tablet (75 mg total) by mouth daily., Disp: 90 tablet, Rfl: 1   fluticasone (FLOVENT HFA) 44 MCG/ACT inhaler, Inhale 2 puffs into the lungs 2 (two) times daily., Disp: , Rfl:    furosemide (LASIX) 80 MG tablet, Take 1 tablet (80 mg total) by mouth daily., Disp: 90 tablet, Rfl: 2   hydrALAZINE (APRESOLINE) 25 MG tablet, Take 1 tablet (25 mg total) by mouth in the morning and at bedtime., Disp: 180 tablet, Rfl: 3   insulin detemir (LEVEMIR) 100 UNIT/ML injection, Inject 0.2 mLs (20 Units total) into the skin 2 (two) times daily., Disp: 10 mL, Rfl: 3   magnesium oxide (MAG-OX) 400 MG tablet, Take 1 tablet (400 mg total) by mouth daily., Disp: 90 tablet, Rfl: 1   Multiple Vitamin (MULTIVITAMIN WITH MINERALS) TABS tablet, Take 1 tablet by mouth daily., Disp: , Rfl:    oxybutynin (DITROPAN) 5 MG tablet, Take 5 mg by mouth 2 (two) times daily., Disp: , Rfl:    pantoprazole (PROTONIX) 40 MG tablet, Take 40 mg by  mouth 2 (two) times daily., Disp: , Rfl:    potassium chloride (KLOR-CON) 10 MEQ tablet, TAKE 1 TABLET (10 MEQ TOTAL) BY MOUTH DAILY. ONLY WHEN TAKING LASIX, Disp: 90 tablet, Rfl: 0   rosuvastatin (CRESTOR) 40 MG tablet, Take 1 tablet (40 mg total) by mouth daily., Disp: 90 tablet, Rfl: 1   spironolactone (ALDACTONE) 50 MG tablet, Take 1 tablet (50 mg total) by mouth daily., Disp: 90 tablet, Rfl: 1   traMADol (ULTRAM) 50 MG tablet, Take 1 tablet (50 mg total) by mouth every 12 (twelve) hours as needed for severe pain. Further refill request will need to be sent to primary care., Disp: 30 tablet, Rfl: 0  Past Medical History: Past Medical History:  Diagnosis Date   Diabetes mellitus without complication ([place])    Hypertension     Tobacco Use: Social History   Tobacco Use  Smoking Status Former   Pack years: 0.00   Types: Cigarettes   Quit date: 07/26/1996   Years since quitting: 24.7  Smokeless Tobacco Never    Labs: Recent Review Flowsheet Data     Labs for ITP Cardiac and Pulmonary Rehab Latest Ref Rng & Units 08/29/2020 08/30/2020 08/30/2020 08/31/2020 09/01/2020   Cholestrol 0 - 200 mg/dL - - - - -   LDLCALC 0 - 99 mg/dL - - - - -  HDL >40 mg/dL - - - - -   Trlycerides <150 mg/dL - - - - -   Hemoglobin A1c 4.8 - 5.6 % - - - - -   PHART 7.350 - 7.450 7.342(L) 7.370 7.345(L) - -   PCO2ART 32.0 - 48.0 mmHg 35.8 35.6 36.1 - -   HCO3 20.0 - 28.0 mmol/L 19.7(L) 20.7 19.9(L) - -   TCO2 22 - 32 mmol/L 21(L) 22 21(L) - -   ACIDBASEDEF 0.0 - 2.0 mmol/L 6.0(H) 4.0(H) 5.0(H) - -   O2SAT % 90.0 94.0 96.0 75.9 70.0        Exercise Target Goals: Exercise Program Goal: Individual exercise prescription set using results from initial 6 min walk test and THRR while considering  patient's activity barriers and safety.   Exercise Prescription Goal: Initial exercise prescription builds to 30-45 minutes a day of aerobic activity, 2-3 days per week.  Home exercise guidelines will be given  to patient during program as part of exercise prescription that the participant will acknowledge.   Education: Aerobic Exercise: - Group verbal and visual presentation on the components of exercise prescription. Introduces F.I.T.T principle from ACSM for exercise prescriptions.  Reviews F.I.T.T. principles of aerobic exercise including progression. Written material given at graduation. Flowsheet Row Cardiac Rehab from 04/15/2021 in [facility] Cardiac and Pulmonary Rehab  Education need identified 03/24/21       Education: Resistance Exercise: - Group verbal and visual presentation on the components of exercise prescription. Introduces F.I.T.T principle from ACSM for exercise prescriptions  Reviews F.I.T.T. principles of resistance exercise including progression. Written material given at graduation.    Education: Exercise & Equipment Safety: - Individual verbal instruction and demonstration of equipment use and safety with use of the equipment. Flowsheet Row Cardiac Rehab from 04/15/2021 in [facility] Cardiac and Pulmonary Rehab  Date 03/24/21  Educator [facility]  Instruction Review Code 1- Verbalizes Understanding       Education: Exercise Physiology & General Exercise Guidelines: - Group verbal and written instruction with models to review the exercise physiology of the cardiovascular system and associated critical values. Provides general exercise guidelines with specific guidelines to those with heart or lung disease.    Education: Flexibility, Balance, Mind/Body Relaxation: - Group verbal and visual presentation with interactive activity on the components of exercise prescription. Introduces F.I.T.T principle from ACSM for exercise prescriptions. Reviews F.I.T.T. principles of flexibility and balance exercise training including progression. Also discusses the mind body connection.  Reviews various relaxation techniques to help reduce and manage stress (i.e. Deep breathing, progressive muscle relaxation,  and visualization). Balance handout provided to take home. Written material given at graduation.   Activity Barriers & Risk Stratification:  Activity Barriers & Cardiac Risk Stratification - 03/24/21 1144       Activity Barriers & Cardiac Risk Stratification   Activity Barriers Joint Problems;Deconditioning;Muscular Weakness;Shortness of Breath;Balance Concerns    Cardiac Risk Stratification High             6 Minute Walk:  6 Minute Walk     Row Name 03/24/21 1144         6 Minute Walk   Phase Initial     Distance 800 feet     Walk Time 6 minutes     # of Rest Breaks 0     MPH 1.52     METS 1.54     RPE 13     Perceived Dyspnea  2     VO2 Peak 5.39  Symptoms Yes (comment)     Comments SOB, fatigue     Resting HR 63 bpm     Resting BP 142/80     Resting Oxygen Saturation  97 %     Exercise Oxygen Saturation  during 6 min walk 98 %     Max Ex. HR 109 bpm     Max Ex. BP 194/96     2 Minute Post BP 150/82              Oxygen Initial Assessment:   Oxygen Re-Evaluation:   Oxygen Discharge (Final Oxygen Re-Evaluation):   Initial Exercise Prescription:  Initial Exercise Prescription - 03/24/21 1100       Date of Initial Exercise RX and Referring Provider   Date 03/24/21    Referring Provider [name] MD      Recumbant Bike   Level 1    RPM 50    Watts 10    Minutes 15    METs 2      NuStep   Level 1    SPM 80    Minutes 15    METs 2      Track   Laps 20    Minutes 15    METs 2.1      Prescription Details   Frequency (times per week) 3    Duration Progress to 30 minutes of continuous aerobic without signs/symptoms of physical distress      Intensity   THRR 40-80% of Max Heartrate 93-122    Ratings of Perceived Exertion 11-13    Perceived Dyspnea 0-4      Progression   Progression Continue to progress workloads to maintain intensity without signs/symptoms of physical distress.      Resistance Training   Training  Prescription Yes    Weight 3 lb    Reps 10-15             Perform Capillary Blood Glucose checks as needed.  Exercise Prescription Changes:   Exercise Prescription Changes     Row Name 03/24/21 1100 03/31/21 1500 04/14/21 1300         Response to Exercise   Blood Pressure (Admit) 142/80 134/82 118/62     Blood Pressure (Exercise) 194/96 142/80 120/62     Blood Pressure (Exit) 136/76 128/82 120/60     Heart Rate (Admit) 63 bpm 64 bpm 68 bpm     Heart Rate (Exercise) 109 bpm 85 bpm 80 bpm     Heart Rate (Exit) 54 bpm 69 bpm 59 bpm     Oxygen Saturation (Admit) 97 % -- --     Oxygen Saturation (Exercise) 98 % -- --     Rating of Perceived Exertion (Exercise) 13 13 13      Perceived Dyspnea (Exercise) 2 -- --     Symptoms SOB, fatigue SOB, fatigue none     Comments walk test results first full day of exercise --     Duration -- Progress to 30 minutes of  aerobic without signs/symptoms of physical distress Continue with 30 min of aerobic exercise without signs/symptoms of physical distress.     Intensity -- THRR unchanged THRR unchanged           Progression       Progression -- Continue to progress workloads to maintain intensity without signs/symptoms of physical distress. Continue to progress workloads to maintain intensity without signs/symptoms of physical distress.     Average METs -- 2.2 2.55  Resistance Training       Training Prescription -- Yes Yes     Weight -- 3 lb 3 lb     Reps -- 10-15 10-15           Interval Training       Interval Training -- No No           NuStep       Level -- 1 1     Minutes -- 30 30     METs -- 2.2 2.55             Exercise Comments:   Exercise Goals and Review:   Exercise Goals     Row Name 03/24/21 1147             Exercise Goals   Increase Physical Activity Yes       Intervention Provide advice, education, support and counseling about physical activity/exercise needs.;Develop an individualized  exercise prescription for aerobic and resistive training based on initial evaluation findings, risk stratification, comorbidities and participant's personal goals.       Expected Outcomes Short Term: Attend rehab on a regular basis to increase amount of physical activity.;Long Term: Add in home exercise to make exercise part of routine and to increase amount of physical activity.;Long Term: Exercising regularly at least 3-5 days a week.       Increase Strength and Stamina Yes       Intervention Provide advice, education, support and counseling about physical activity/exercise needs.;Develop an individualized exercise prescription for aerobic and resistive training based on initial evaluation findings, risk stratification, comorbidities and participant's personal goals.       Expected Outcomes Short Term: Increase workloads from initial exercise prescription for resistance, speed, and METs.;Short Term: Perform resistance training exercises routinely during rehab and add in resistance training at home;Long Term: Improve cardiorespiratory fitness, muscular endurance and strength as measured by increased METs and functional capacity (6MWT)       Able to understand and use rate of perceived exertion (RPE) scale Yes       Intervention Provide education and explanation on how to use RPE scale       Expected Outcomes Short Term: Able to use RPE daily in rehab to express subjective intensity level;Long Term:  Able to use RPE to guide intensity level when exercising independently       Able to understand and use Dyspnea scale Yes       Intervention Provide education and explanation on how to use Dyspnea scale       Expected Outcomes Short Term: Able to use Dyspnea scale daily in rehab to express subjective sense of shortness of breath during exertion;Long Term: Able to use Dyspnea scale to guide intensity level when exercising independently       Knowledge and understanding of Target Heart Rate Range (THRR) Yes        Intervention Provide education and explanation of THRR including how the numbers were predicted and where they are located for reference       Expected Outcomes Short Term: Able to state/look up THRR;Long Term: Able to use THRR to govern intensity when exercising independently;Short Term: Able to use daily as guideline for intensity in rehab       Able to check pulse independently Yes       Intervention Provide education and demonstration on how to check pulse in carotid and radial arteries.;Review the importance of being able to check your own pulse for safety  during independent exercise       Expected Outcomes Short Term: Able to explain why pulse checking is important during independent exercise;Long Term: Able to check pulse independently and accurately       Understanding of Exercise Prescription Yes       Intervention Provide education, explanation, and written materials on patient's individual exercise prescription       Expected Outcomes Short Term: Able to explain program exercise prescription;Long Term: Able to explain home exercise prescription to exercise independently                Exercise Goals Re-Evaluation :  Exercise Goals Re-Evaluation     [place] Name 03/30/21 1745 04/14/21 1305           Exercise Goal Re-Evaluation   Exercise Goals Review Increase Physical Activity;Able to understand and use rate of perceived exertion (RPE) scale;Knowledge and understanding of Target Heart Rate Range (THRR);Understanding of Exercise Prescription;Increase Strength and Stamina;Able to check pulse independently Increase Physical Activity;Increase Strength and Stamina      Comments Reviewed RPE and dyspnea scales, THR and program prescription with pt today.  Pt voiced understanding and was given a copy of goals to take home. Farid can do 30 min on NS.  Staff will encourage trying to do some laps on track and increasing level on NS.      Expected Outcomes Short: Use RPE daily to regulate  intensity. Long: Follow program prescription in THR. Short: try track/increase level on NS Long: improve overall stamina               Discharge Exercise Prescription (Final Exercise Prescription Changes):  Exercise Prescription Changes - 04/14/21 1300       Response to Exercise   Blood Pressure (Admit) 118/62    Blood Pressure (Exercise) 120/62    Blood Pressure (Exit) 120/60    Heart Rate (Admit) 68 bpm    Heart Rate (Exercise) 80 bpm    Heart Rate (Exit) 59 bpm    Rating of Perceived Exertion (Exercise) 13    Symptoms none    Duration Continue with 30 min of aerobic exercise without signs/symptoms of physical distress.    Intensity THRR unchanged      Progression   Progression Continue to progress workloads to maintain intensity without signs/symptoms of physical distress.    Average METs 2.55      Resistance Training   Training Prescription Yes    Weight 3 lb    Reps 10-15      Interval Training   Interval Training No      NuStep   Level 1    Minutes 30    METs 2.55             Nutrition:  Target Goals: Understanding of nutrition guidelines, daily intake of sodium 1500mg , cholesterol 200mg , calories 30% from fat and 7% or less from saturated fats, daily to have 5 or more servings of fruits and vegetables.  Education: All About Nutrition: -Group instruction provided by verbal, written material, interactive activities, discussions, models, and posters to present general guidelines for heart healthy nutrition including fat, fiber, MyPlate, the role of sodium in heart healthy nutrition, utilization of the nutrition label, and utilization of this knowledge for meal planning. Follow up email sent as well. Written material given at graduation. Flowsheet Row Cardiac Rehab from 04/15/2021 in [facility] Cardiac and Pulmonary Rehab  Education need identified 03/24/21       Biometrics:  Pre Biometrics - 03/24/21  1244       Pre Biometrics   Height 5' 8.6" (1.742 m)     Weight 228 lb 1.6 oz (103.5 kg)    BMI (Calculated) 34.1    Single Leg Stand 1.2 seconds              Nutrition Therapy Plan and Nutrition Goals:   Nutrition Assessments:  MEDIFICTS Score Key: ?70 Need to make dietary changes  40-70 Heart Healthy Diet ? 40 Therapeutic Level Cholesterol Diet  Flowsheet Row Cardiac Rehab from 03/24/2021 in [facility] Cardiac and Pulmonary Rehab  Picture Your Plate Total Score on Admission 80      Picture Your Plate Scores: <41 Unhealthy dietary pattern with much room for improvement. 41-50 Dietary pattern unlikely to meet recommendations for good health and room for improvement. 51-60 More healthful dietary pattern, with some room for improvement.  >60 Healthy dietary pattern, although there may be some specific behaviors that could be improved.    Nutrition Goals Re-Evaluation:  Nutrition Goals Re-Evaluation     Row Name 04/16/21 1742             Goals   Current Weight 228 lb (103.4 kg)       Nutrition Goal Possible snacking or eating a little more.       Comment Kalee states that he is meeting with the dietician on 04/27/2021. He is not sure if he is eating enough food. He wants to lose some weight. He feels like he has some more energy since he started but would like to have more. Informed him to make a 3 day log of what he is eating to get an idea of his nutrition habits.       Expected Outcome Short: meet with the dietician. Long: maintain a healthy diet that pertains to his needs.                Nutrition Goals Discharge (Final Nutrition Goals Re-Evaluation):  Nutrition Goals Re-Evaluation - 04/16/21 1742       Goals   Current Weight 228 lb (103.4 kg)    Nutrition Goal Possible snacking or eating a little more.    Comment Ryann states that he is meeting with the dietician on 04/27/2021. He is not sure if he is eating enough food. He wants to lose some weight. He feels like he has some more energy since he started but would like  to have more. Informed him to make a 3 day log of what he is eating to get an idea of his nutrition habits.    Expected Outcome Short: meet with the dietician. Long: maintain a healthy diet that pertains to his needs.             Psychosocial: Target Goals: Acknowledge presence or absence of significant depression and/or stress, maximize coping skills, provide positive support system. Participant is able to verbalize types and ability to use techniques and skills needed for reducing stress and depression.   Education: Stress, Anxiety, and Depression - Group verbal and visual presentation to define topics covered.  Reviews how body is impacted by stress, anxiety, and depression.  Also discusses healthy ways to reduce stress and to treat/manage anxiety and depression.  Written material given at graduation. Flowsheet Row Cardiac Rehab from 04/15/2021 in [facility] Cardiac and Pulmonary Rehab  Date 04/15/21  Educator AS  Instruction Review Code 1- Verbalizes Understanding       Education: Sleep Hygiene -Provides group verbal and written instruction about how  sleep can affect your health.  Define sleep hygiene, discuss sleep cycles and impact of sleep habits. Review good sleep hygiene tips.    Initial Review & Psychosocial Screening:  Initial Psych Review & Screening - 03/18/21 1039       Initial Review   Current issues with None Identified      Family Dynamics   Good Support System? Yes   family     Barriers   Psychosocial barriers to participate in program There are no identifiable barriers or psychosocial needs.;The patient should benefit from training in stress management and relaxation.      Screening Interventions   Interventions Encouraged to exercise;Provide feedback about the scores to participant;To provide support and resources with identified psychosocial needs    Expected Outcomes Short Term goal: Utilizing psychosocial counselor, staff and physician to assist with  identification of specific Stressors or current issues interfering with healing process. Setting desired goal for each stressor or current issue identified.;Long Term Goal: Stressors or current issues are controlled or eliminated.;Short Term goal: Identification and review with participant of any Quality of Life or Depression concerns found by scoring the questionnaire.;Long Term goal: The participant improves quality of Life and PHQ9 Scores as seen by post scores and/or verbalization of changes             Quality of Life Scores:   Quality of Life - 03/24/21 1245       Quality of Life   Select Quality of Life      Quality of Life Scores   Health/Function Pre 26.26 %    Socioeconomic Pre 29.5 %    Psych/Spiritual Pre 28.8 %    Family Pre 27.6 %    GLOBAL Pre 27.78 %            Scores of 19 and below usually indicate a poorer quality of life in these areas.  A difference of  2-3 points is a clinically meaningful difference.  A difference of 2-3 points in the total score of the Quality of Life Index has been associated with significant improvement in overall quality of life, self-image, physical symptoms, and general health in studies assessing change in quality of life.  PHQ-9: Recent Review Flowsheet Data     Depression screen [facility] 2/9 03/24/2021   Decreased Interest 0   Down, Depressed, Hopeless 0   PHQ - 2 Score 0   Altered sleeping 0   Tired, decreased energy 0   Change in appetite 0   Feeling bad or failure about yourself  0   Trouble concentrating 0   Moving slowly or fidgety/restless 0   Suicidal thoughts 0   PHQ-9 Score 0   Difficult doing work/chores Not difficult at all      Interpretation of Total Score  Total Score Depression Severity:  1-4 = Minimal depression, 5-9 = Mild depression, 10-14 = Moderate depression, 15-19 = Moderately severe depression, 20-27 = Severe depression   Psychosocial Evaluation and Intervention:  Psychosocial Evaluation - 03/18/21  1053       Psychosocial Evaluation & Interventions   Interventions Encouraged to exercise with the program and follow exercise prescription    Comments Mr. Vos reports doing well post CABG and NSTEMI in October of 2021. He said he is eating and sleeping well. He is still doing what he wants to do during the day. He does have a copay which will most likely hinder the length of his attendance. While he is in the program he  wants to work on his diabetes and maintaining his independence.    Expected Outcomes Short: attend cardiac rehab for education and exercise. Long: develop positive self care habits.    Continue Psychosocial Services  Follow up required by staff             Psychosocial Re-Evaluation:  Psychosocial Re-Evaluation     [place] Name 04/16/21 1745             Psychosocial Re-Evaluation   Current issues with None Identified       Comments Patient reports no issues with their current mental states, sleep, stress, depression or anxiety. Will follow up with patient in a few weeks for any changes. Kaled has 5 kids and 22 grandkids. He sees his grandkids often.       Expected Outcomes Short: Continue to exercise regularly to support mental health and notify staff of any changes. Long: maintain mental health and well being through teaching of rehab or prescribed medications independently.       Interventions Encouraged to attend Cardiac Rehabilitation for the exercise       Continue Psychosocial Services  Follow up required by staff                Psychosocial Discharge (Final Psychosocial Re-Evaluation):  Psychosocial Re-Evaluation - 04/16/21 1745       Psychosocial Re-Evaluation   Current issues with None Identified    Comments Patient reports no issues with their current mental states, sleep, stress, depression or anxiety. Will follow up with patient in a few weeks for any changes. Calyn has 5 kids and 22 grandkids. He sees his grandkids often.    Expected Outcomes  Short: Continue to exercise regularly to support mental health and notify staff of any changes. Long: maintain mental health and well being through teaching of rehab or prescribed medications independently.    Interventions Encouraged to attend Cardiac Rehabilitation for the exercise    Continue Psychosocial Services  Follow up required by staff             Vocational Rehabilitation: Provide vocational rehab assistance to qualifying candidates.   Vocational Rehab Evaluation & Intervention:  Vocational Rehab - 03/18/21 1053       Initial Vocational Rehab Evaluation & Intervention   Assessment shows need for Vocational Rehabilitation No             Education: Education Goals: Education classes will be provided on a variety of topics geared toward better understanding of heart health and risk factor modification. Participant will state understanding/return demonstration of topics presented as noted by education test scores.  Learning Barriers/Preferences:  Learning Barriers/Preferences - 03/18/21 1040       Learning Barriers/Preferences   Learning Barriers None    Learning Preferences None             General Cardiac Education Topics:  AED/CPR: - Group verbal and written instruction with the use of models to demonstrate the basic use of the AED with the basic ABC's of resuscitation.   Anatomy and Cardiac Procedures: - Group verbal and visual presentation and models provide information about basic cardiac anatomy and function. Reviews the testing methods done to diagnose heart disease and the outcomes of the test results. Describes the treatment choices: Medical Management, Angioplasty, or Coronary Bypass Surgery for treating various heart conditions including Myocardial Infarction, Angina, Valve Disease, and Cardiac Arrhythmias.  Written material given at graduation. Flowsheet Row Cardiac Rehab from 04/15/2021 in [facility] Cardiac and Pulmonary Rehab  Education need  identified 03/24/21       Medication Safety: - Group verbal and visual instruction to review commonly prescribed medications for heart and lung disease. Reviews the medication, class of the drug, and side effects. Includes the steps to properly store meds and maintain the prescription regimen.  Written material given at graduation.   Intimacy: - Group verbal instruction through game format to discuss how heart and lung disease can affect sexual intimacy. Written material given at graduation..   Know Your Numbers and Heart Failure: - Group verbal and visual instruction to discuss disease risk factors for cardiac and pulmonary disease and treatment options.  Reviews associated critical values for Overweight/Obesity, Hypertension, Cholesterol, and Diabetes.  Discusses basics of heart failure: signs/symptoms and treatments.  Introduces Heart Failure Zone chart for action plan for heart failure.  Written material given at graduation. Flowsheet Row Cardiac Rehab from 04/15/2021 in [facility] Cardiac and Pulmonary Rehab  Date 04/01/21  Educator [facility]  Instruction Review Code 1- Verbalizes Understanding       Infection Prevention: - Provides verbal and written material to individual with discussion of infection control including proper hand washing and proper equipment cleaning during exercise session. Flowsheet Row Cardiac Rehab from 04/15/2021 in [facility] Cardiac and Pulmonary Rehab  Date 03/24/21  Educator [facility]  Instruction Review Code 1- Verbalizes Understanding       Falls Prevention: - Provides verbal and written material to individual with discussion of falls prevention and safety. Flowsheet Row Cardiac Rehab from 04/15/2021 in [facility] Cardiac and Pulmonary Rehab  Date 03/24/21  Educator [facility]  Instruction Review Code 1- Verbalizes Understanding       Other: -Provides group and verbal instruction on various topics (see comments)   Knowledge Questionnaire Score:  Knowledge Questionnaire Score -  03/24/21 1246       Knowledge Questionnaire Score   Pre Score 19/26 Education Focus: nutrition, exercise, angina, cessation             Core Components/Risk Factors/Patient Goals at Admission:  Personal Goals and Risk Factors at Admission - 03/24/21 1246       Core Components/Risk Factors/Patient Goals on Admission    Weight Management Yes;Obesity;Weight Loss    Intervention Weight Management: Develop a combined nutrition and exercise program designed to reach desired caloric intake, while maintaining appropriate intake of nutrient and fiber, sodium and fats, and appropriate energy expenditure required for the weight goal.;Weight Management: Provide education and appropriate resources to help participant work on and attain dietary goals.;Weight Management/Obesity: Establish reasonable short term and long term weight goals.;Obesity: Provide education and appropriate resources to help participant work on and attain dietary goals.    Admit Weight 228 lb 1.6 oz (103.5 kg)    Goal Weight: Short Term 223 lb (101.2 kg)    Goal Weight: Long Term 220 lb (99.8 kg)    Expected Outcomes Short Term: Continue to assess and modify interventions until short term weight is achieved;Long Term: Adherence to nutrition and physical activity/exercise program aimed toward attainment of established weight goal;Weight Loss: Understanding of general recommendations for a balanced deficit meal plan, which promotes 1-2 lb weight loss per week and includes a negative energy balance of [phone] kcal/d;Understanding recommendations for meals to include 15-35% energy as protein, 25-35% energy from fat, 35-60% energy from carbohydrates, less than 200mg  of dietary cholesterol, 20-35 gm of total fiber daily;Understanding of distribution of calorie intake throughout the day with the consumption of 4-5 meals/snacks    Diabetes Yes  Intervention Provide education about signs/symptoms and action to take for  hypo/hyperglycemia.;Provide education about proper nutrition, including hydration, and aerobic/resistive exercise prescription along with prescribed medications to achieve blood glucose in normal ranges: Fasting glucose 65-99 mg/dL    Expected Outcomes Short Term: Participant verbalizes understanding of the signs/symptoms and immediate care of hyper/hypoglycemia, proper foot care and importance of medication, aerobic/resistive exercise and nutrition plan for blood glucose control.;Long Term: Attainment of HbA1C < 7%.    Hypertension Yes    Intervention Provide education on lifestyle modifcations including regular physical activity/exercise, weight management, moderate sodium restriction and increased consumption of fresh fruit, vegetables, and low fat dairy, alcohol moderation, and smoking cessation.;Monitor prescription use compliance.    Expected Outcomes Short Term: Continued assessment and intervention until BP is < 140/78mm HG in hypertensive participants. < 130/27mm HG in hypertensive participants with diabetes, heart failure or chronic kidney disease.;Long Term: Maintenance of blood pressure at goal levels.    Lipids Yes    Intervention Provide education and support for participant on nutrition & aerobic/resistive exercise along with prescribed medications to achieve LDL 70mg , HDL >40mg .    Expected Outcomes Short Term: Participant states understanding of desired cholesterol values and is compliant with medications prescribed. Participant is following exercise prescription and nutrition guidelines.;Long Term: Cholesterol controlled with medications as prescribed, with individualized exercise RX and with personalized nutrition plan. Value goals: LDL < 70mg , HDL > 40 mg.             Education:Diabetes - Individual verbal and written instruction to review signs/symptoms of diabetes, desired ranges of glucose level fasting, after meals and with exercise. Acknowledge that pre and post exercise  glucose checks will be done for 3 sessions at entry of program. [place] from 04/15/2021 in [facility] Cardiac and Pulmonary Rehab  Date 03/18/21  Educator [facility]  Instruction Review Code 1- Verbalizes Understanding       Core Components/Risk Factors/Patient Goals Review:   Goals and Risk Factor Review     Row Name 04/16/21 1739             Core Components/Risk Factors/Patient Goals Review   Personal Goals Review Weight Management/Obesity;Diabetes       Review Juliano states that his blood sugars have been good at home between 120 and 130 fasting. He is still trying to lose a couple pounds. He wants to reach a weight goal of 200 pounds. Informed patient that he needs to figure out if what he is eating is going to help him lose weight.       Expected Outcomes Short: lose some weight. Long: lose 10 pounds in the program.                Core Components/Risk Factors/Patient Goals at Discharge (Final Review):   Goals and Risk Factor Review - 04/16/21 1739       Core Components/Risk Factors/Patient Goals Review   Personal Goals Review Weight Management/Obesity;Diabetes    Review Chen states that his blood sugars have been good at home between 120 and 130 fasting. He is still trying to lose a couple pounds. He wants to reach a weight goal of 200 pounds. Informed patient that he needs to figure out if what he is eating is going to help him lose weight.    Expected Outcomes Short: lose some weight. Long: lose 10 pounds in the program.             ITP Comments:  ITP Comments  [place] Name 03/18/21 1059 03/24/21 1123 03/25/21 0719 03/30/21 1745 04/22/21 0747   ITP Comments Initial telephone orientation completed. Diagnosis can be found in [facility] 4/22. EP orientation scheduled for Tuesday 5/17 at 10am. Completed 6MWT and gym orientation. Initial ITP created and sent for review to Dr. Emily Filbert, Medical Director. 30 Day review completed. Medical Director ITP review done,  changes made as directed, and signed approval by Medical Director.   New to program First full day of exercise!  Patient was oriented to gym and equipment including functions, settings, policies, and procedures.  Patient's individual exercise prescription and treatment plan were reviewed.  All starting workloads were established based on the results of the 6 minute walk test done at initial orientation visit.  The plan for exercise progression was also introduced and progression will be customized based on patient's performance and goals 30 Day review completed. Medical Director ITP review done, changes made as directed, and signed approval by Medical Director.            Comments:

## 2021-04-23 ENCOUNTER — Encounter: Payer: Medicare HMO | Admitting: *Deleted

## 2021-04-23 DIAGNOSIS — I214 Non-ST elevation (NSTEMI) myocardial infarction: Secondary | ICD-10-CM | POA: Diagnosis not present

## 2021-04-23 DIAGNOSIS — Z951 Presence of aortocoronary bypass graft: Secondary | ICD-10-CM

## 2021-04-23 NOTE — Progress Notes (Signed)
Daily Session Note  Patient Details  Name: Freedom Peddy MRN: [id number] Date of Birth: [birth year] Referring Provider:   Flowsheet Row Cardiac Rehab from 03/24/2021 in [facility] Cardiac and Pulmonary Rehab  Referring Provider [name] MD       Encounter Date: 04/23/2021  Check In:  Session Check In - 04/23/21 [place]       Check-In   Supervising physician immediately available to respond to emergencies See telemetry face sheet for immediately available ER MD    Location [facility]-Cardiac & Pulmonary Rehab    Staff Present Renita Papa, RN BSN;Joseph Hood RCP,RRT,BSRT;Melissa [place] RDN, Tawanna Solo, MS, ASCM CEP, Exercise Physiologist    Virtual Visit No    Medication changes reported     No    Fall or balance concerns reported    No    Warm-up and Cool-down Performed on first and last piece of equipment    Resistance Training Performed Yes    VAD Patient? No    PAD/SET Patient? No      Pain Assessment   Currently in Pain? No/denies                Social History   Tobacco Use  Smoking Status Former   Pack years: 0.00   Types: Cigarettes   Quit date: 07/26/1996   Years since quitting: 24.7  Smokeless Tobacco Never    Goals Met:  Independence with exercise equipment Exercise tolerated well No report of cardiac concerns or symptoms Strength training completed today  Goals Unmet:  Not Applicable  Comments: Pt able to follow exercise prescription today without complaint.  Will continue to monitor for progression.    Dr. Emily Filbert is Medical Director for [place].  Dr. Ottie Glazier is Medical Director for [facility] Pulmonary Rehabilitation.

## 2021-04-27 ENCOUNTER — Other Ambulatory Visit: Payer: Self-pay

## 2021-04-27 ENCOUNTER — Encounter: Payer: Medicare HMO | Admitting: *Deleted

## 2021-04-27 DIAGNOSIS — I214 Non-ST elevation (NSTEMI) myocardial infarction: Secondary | ICD-10-CM | POA: Diagnosis not present

## 2021-04-27 DIAGNOSIS — Z951 Presence of aortocoronary bypass graft: Secondary | ICD-10-CM

## 2021-04-27 NOTE — Progress Notes (Signed)
Daily Session Note  Patient Details  Name: [name] MRN: [id number] Date of Birth: 1934/12/12 Referring Provider:   Flowsheet Row Cardiac Rehab from 03/24/2021 in [facility] Cardiac and Pulmonary Rehab  Referring Provider [name] MD       Encounter Date: 04/27/2021  Check In:  Session Check In - 04/27/21 1721       Check-In   Supervising physician immediately available to respond to emergencies See telemetry face sheet for immediately available ER MD    Location [facility]-Cardiac & Pulmonary Rehab    Staff Present Renita Papa, RN Margurite Auerbach, MS, ASCM CEP, Exercise Physiologist;Kelly Amedeo Plenty, BS, ACSM CEP, Exercise Physiologist    Virtual Visit No    Medication changes reported     No    Fall or balance concerns reported    No    Warm-up and Cool-down Performed on first and last piece of equipment    Resistance Training Performed Yes    VAD Patient? No    PAD/SET Patient? No      Pain Assessment   Currently in Pain? No/denies                Social History   Tobacco Use  Smoking Status Former   Pack years: 0.00   Types: Cigarettes   Quit date: 07/26/1996   Years since quitting: 24.7  Smokeless Tobacco Never    Goals Met:  Independence with exercise equipment Exercise tolerated well No report of cardiac concerns or symptoms Strength training completed today  Goals Unmet:  Not Applicable  Comments: Pt able to follow exercise prescription today without complaint.  Will continue to monitor for progression.    Dr. Emily Filbert is Medical Director for [place].  Dr. Ottie Glazier is Medical Director for [facility] Pulmonary Rehabilitation.

## 2021-04-29 ENCOUNTER — Other Ambulatory Visit: Payer: Self-pay

## 2021-04-29 DIAGNOSIS — I214 Non-ST elevation (NSTEMI) myocardial infarction: Secondary | ICD-10-CM | POA: Diagnosis not present

## 2021-04-29 DIAGNOSIS — Z951 Presence of aortocoronary bypass graft: Secondary | ICD-10-CM

## 2021-04-29 NOTE — Progress Notes (Signed)
Daily Session Note  Patient Details  Name: Denis Carreon MRN: [id number] Date of Birth: 1935-03-04 Referring Provider:   Flowsheet Row Cardiac Rehab from 03/24/2021 in [facility] Cardiac and Pulmonary Rehab  Referring Provider [name] MD       Encounter Date: 04/29/2021  Check In:  Session Check In - 04/29/21 1720       Check-In   Supervising physician immediately available to respond to emergencies See telemetry face sheet for immediately available ER MD    Location [facility]-Cardiac & Pulmonary Rehab    Staff Present Birdie Sons, MPA, RN;Melissa Caiola RDN, LDN;Joseph Lou Miner, MS, ASCM CEP, Exercise Physiologist    Virtual Visit No    Medication changes reported     No    Fall or balance concerns reported    No    Warm-up and Cool-down Performed on first and last piece of equipment    Resistance Training Performed Yes    VAD Patient? No    PAD/SET Patient? No      Pain Assessment   Currently in Pain? No/denies                Social History   Tobacco Use  Smoking Status Former   Pack years: 0.00   Types: Cigarettes   Quit date: 07/26/1996   Years since quitting: 24.7  Smokeless Tobacco Never    Goals Met:  Independence with exercise equipment Exercise tolerated well No report of cardiac concerns or symptoms Strength training completed today  Goals Unmet:  Not Applicable  Comments: Pt able to follow exercise prescription today without complaint.  Will continue to monitor for progression.    Dr. Emily Filbert is Medical Director for [place].  Dr. Ottie Glazier is Medical Director for [facility] Pulmonary Rehabilitation.

## 2021-05-04 ENCOUNTER — Other Ambulatory Visit: Payer: Self-pay

## 2021-05-04 ENCOUNTER — Encounter: Payer: Medicare HMO | Admitting: *Deleted

## 2021-05-04 DIAGNOSIS — Z951 Presence of aortocoronary bypass graft: Secondary | ICD-10-CM

## 2021-05-04 DIAGNOSIS — I214 Non-ST elevation (NSTEMI) myocardial infarction: Secondary | ICD-10-CM | POA: Diagnosis not present

## 2021-05-04 NOTE — Progress Notes (Signed)
Daily Session Note  Patient Details  Name: [name] MRN: [id number] Date of Birth: [birth year] Referring Provider:   Flowsheet Row Cardiac Rehab from 03/24/2021 in [facility] Cardiac and Pulmonary Rehab  Referring Provider [name] MD       Encounter Date: 05/04/2021  Check In:  Session Check In - 05/04/21 1738       Check-In   Supervising physician immediately available to respond to emergencies See telemetry face sheet for immediately available ER MD    Location [facility]-Cardiac & Pulmonary Rehab    Staff Present Renita Papa, RN Moises Blood, BS, ACSM CEP, Exercise Physiologist;Kara Eliezer Bottom, MS, ASCM CEP, Exercise Physiologist    Virtual Visit No    Medication changes reported     No    Fall or balance concerns reported    No    Warm-up and Cool-down Performed on first and last piece of equipment    Resistance Training Performed Yes    VAD Patient? No    PAD/SET Patient? No      Pain Assessment   Currently in Pain? No/denies                Social History   Tobacco Use  Smoking Status Former   Pack years: 0.00   Types: Cigarettes   Quit date: 07/26/1996   Years since quitting: 24.7  Smokeless Tobacco Never    Goals Met:  Independence with exercise equipment Exercise tolerated well No report of cardiac concerns or symptoms Strength training completed today  Goals Unmet:  Not Applicable  Comments: Pt able to follow exercise prescription today without complaint.  Will continue to monitor for progression.    Dr. Emily Filbert is Medical Director for [place].  Dr. Ottie Glazier is Medical Director for [facility] Pulmonary Rehabilitation.

## 2021-05-06 ENCOUNTER — Other Ambulatory Visit: Payer: Self-pay

## 2021-05-06 DIAGNOSIS — I214 Non-ST elevation (NSTEMI) myocardial infarction: Secondary | ICD-10-CM

## 2021-05-06 DIAGNOSIS — Z951 Presence of aortocoronary bypass graft: Secondary | ICD-10-CM

## 2021-05-06 NOTE — Progress Notes (Signed)
Daily Session Note  Patient Details  Name: [name] MRN: [id number] Date of Birth: November 15, 1934 Referring Provider:   Flowsheet Row Cardiac Rehab from 03/24/2021 in [facility] Cardiac and Pulmonary Rehab  Referring Provider [name] MD       Encounter Date: 05/06/2021  Check In:  Session Check In - 05/06/21 1709       Check-In   Supervising physician immediately available to respond to emergencies See telemetry face sheet for immediately available ER MD    Location [facility]-Cardiac & Pulmonary Rehab    Staff Present Birdie Sons, MPA, Nino Glow, MS, ASCM CEP, Exercise Physiologist;Joseph Tessie Fass, [place]    Virtual Visit No    Medication changes reported     No    Fall or balance concerns reported    No    Warm-up and Cool-down Performed on first and last piece of equipment    Resistance Training Performed Yes    VAD Patient? No    PAD/SET Patient? No      Pain Assessment   Currently in Pain? No/denies                Social History   Tobacco Use  Smoking Status Former   Pack years: 0.00   Types: Cigarettes   Quit date: 07/26/1996   Years since quitting: 24.7  Smokeless Tobacco Never    Goals Met:  Independence with exercise equipment Exercise tolerated well No report of cardiac concerns or symptoms Strength training completed today  Goals Unmet:  Not Applicable  Comments: Pt able to follow exercise prescription today without complaint.  Will continue to monitor for progression.    Dr. Emily Filbert is Medical Director for [place].  Dr. Ottie Glazier is Medical Director for [facility] Pulmonary Rehabilitation.

## 2021-05-16 ENCOUNTER — Other Ambulatory Visit: Payer: Self-pay | Admitting: Family

## 2021-05-17 ENCOUNTER — Other Ambulatory Visit: Payer: Self-pay | Admitting: Family

## 2021-05-18 ENCOUNTER — Other Ambulatory Visit: Payer: Self-pay

## 2021-05-18 ENCOUNTER — Encounter: Payer: Medicare HMO | Attending: Cardiovascular Disease | Admitting: *Deleted

## 2021-05-18 DIAGNOSIS — I214 Non-ST elevation (NSTEMI) myocardial infarction: Secondary | ICD-10-CM

## 2021-05-18 DIAGNOSIS — Z951 Presence of aortocoronary bypass graft: Secondary | ICD-10-CM | POA: Diagnosis not present

## 2021-05-18 DIAGNOSIS — Z48812 Encounter for surgical aftercare following surgery on the circulatory system: Secondary | ICD-10-CM | POA: Diagnosis present

## 2021-05-18 DIAGNOSIS — I252 Old myocardial infarction: Secondary | ICD-10-CM | POA: Insufficient documentation

## 2021-05-18 NOTE — Progress Notes (Signed)
Daily Session Note  Patient Details  Name: Marko Skalski MRN: [id number] Date of Birth: 06-Oct-1935 Referring Provider:   Flowsheet Row Cardiac Rehab from 03/24/2021 in [facility] Cardiac and Pulmonary Rehab  Referring Provider [name] MD       Encounter Date: 05/18/2021  Check In:  Session Check In - 05/18/21 1725       Check-In   Supervising physician immediately available to respond to emergencies See telemetry face sheet for immediately available ER MD    Location [facility]-Cardiac & Pulmonary Rehab    Staff Present Renita Papa, RN Moises Blood, BS, ACSM CEP, Exercise Physiologist;Kelly [name] Hammers, MPA, RN    Virtual Visit No    Medication changes reported     No    Fall or balance concerns reported    No    Warm-up and Cool-down Performed on first and last piece of equipment    Resistance Training Performed Yes    VAD Patient? No    PAD/SET Patient? No      Pain Assessment   Currently in Pain? No/denies                Social History   Tobacco Use  Smoking Status Former   Pack years: 0.00   Types: Cigarettes   Quit date: 07/26/1996   Years since quitting: 24.8  Smokeless Tobacco Never    Goals Met:  Independence with exercise equipment Exercise tolerated well Personal goals reviewed No report of cardiac concerns or symptoms Strength training completed today  Goals Unmet:  Not Applicable  Comments: Pt able to follow exercise prescription today without complaint.  Will continue to monitor for progression.    Dr. Emily Filbert is Medical Director for [place].  Dr. Ottie Glazier is Medical Director for [facility] Pulmonary Rehabilitation.

## 2021-05-20 ENCOUNTER — Encounter: Payer: Self-pay | Admitting: *Deleted

## 2021-05-20 DIAGNOSIS — Z951 Presence of aortocoronary bypass graft: Secondary | ICD-10-CM

## 2021-05-20 DIAGNOSIS — I214 Non-ST elevation (NSTEMI) myocardial infarction: Secondary | ICD-10-CM

## 2021-05-20 NOTE — Progress Notes (Signed)
Cardiac Individual Treatment Plan  Patient Details  Name: [name] MRN: [id number] Date of Birth: May 06, 1935 Referring Provider:   Flowsheet Row Cardiac Rehab from 03/24/2021 in [facility] Cardiac and Pulmonary Rehab  Referring Provider [name] MD       Initial Encounter Date:  Flowsheet Row Cardiac Rehab from 03/24/2021 in [facility] Cardiac and Pulmonary Rehab  Date 03/24/21       Visit Diagnosis: NSTEMI (non-ST elevation myocardial infarction) ([place])  S/P CABG x 5  Patient's Home Medications on Admission:  Current Outpatient Medications:    acetaminophen (TYLENOL) 500 MG tablet, Take 500-1,000 mg by mouth every 6 (six) hours as needed for mild pain or moderate pain. , Disp: , Rfl:    acyclovir (ZOVIRAX) 800 MG tablet, Take 800 mg by mouth daily., Disp: , Rfl:    amLODipine (NORVASC) 10 MG tablet, Take 1 tablet (10 mg total) by mouth daily., Disp: 90 tablet, Rfl: 1   aspirin EC 81 MG tablet, Take 81 mg by mouth daily., Disp: , Rfl:    clopidogrel (PLAVIX) 75 MG tablet, TAKE 1 TABLET BY MOUTH EVERY DAY, Disp: 90 tablet, Rfl: 1   fluticasone (FLOVENT HFA) 44 MCG/ACT inhaler, Inhale 2 puffs into the lungs 2 (two) times daily., Disp: , Rfl:    furosemide (LASIX) 80 MG tablet, Take 1 tablet (80 mg total) by mouth daily., Disp: 90 tablet, Rfl: 2   hydrALAZINE (APRESOLINE) 25 MG tablet, Take 1 tablet (25 mg total) by mouth in the morning and at bedtime., Disp: 180 tablet, Rfl: 3   insulin detemir (LEVEMIR) 100 UNIT/ML injection, Inject 0.2 mLs (20 Units total) into the skin 2 (two) times daily., Disp: 10 mL, Rfl: 3   magnesium oxide (MAG-OX) 400 MG tablet, Take 1 tablet (400 mg total) by mouth daily., Disp: 90 tablet, Rfl: 1   Multiple Vitamin (MULTIVITAMIN WITH MINERALS) TABS tablet, Take 1 tablet by mouth daily., Disp: , Rfl:    oxybutynin (DITROPAN) 5 MG tablet, Take 5 mg by mouth 2 (two) times daily., Disp: , Rfl:    pantoprazole (PROTONIX) 40 MG tablet, Take 40 mg by mouth 2 (two)  times daily., Disp: , Rfl:    potassium chloride (KLOR-CON) 10 MEQ tablet, TAKE 1 TABLET (10 MEQ TOTAL) BY MOUTH DAILY. ONLY WHEN TAKING LASIX, Disp: 90 tablet, Rfl: 0   rosuvastatin (CRESTOR) 40 MG tablet, Take 1 tablet (40 mg total) by mouth daily., Disp: 90 tablet, Rfl: 1   spironolactone (ALDACTONE) 50 MG tablet, TAKE 1 TABLET BY MOUTH EVERY DAY, Disp: 90 tablet, Rfl: 0   traMADol (ULTRAM) 50 MG tablet, Take 1 tablet (50 mg total) by mouth every 12 (twelve) hours as needed for severe pain. Further refill request will need to be sent to primary care., Disp: 30 tablet, Rfl: 0  Past Medical History: Past Medical History:  Diagnosis Date   Diabetes mellitus without complication ([place])    Hypertension     Tobacco Use: Social History   Tobacco Use  Smoking Status Former   Pack years: 0.00   Types: Cigarettes   Quit date: 07/26/1996   Years since quitting: 24.8  Smokeless Tobacco Never    Labs: Recent Review Flowsheet Data     Labs for ITP Cardiac and Pulmonary Rehab Latest Ref Rng & Units 08/29/2020 08/30/2020 08/30/2020 08/31/2020 09/01/2020   Cholestrol 0 - 200 mg/dL - - - - -   LDLCALC 0 - 99 mg/dL - - - - -   HDL >40 mg/dL - - - - -  Trlycerides <150 mg/dL - - - - -   Hemoglobin A1c 4.8 - 5.6 % - - - - -   PHART 7.350 - 7.450 7.342(L) 7.370 7.345(L) - -   PCO2ART 32.0 - 48.0 mmHg 35.8 35.6 36.1 - -   HCO3 20.0 - 28.0 mmol/L 19.7(L) 20.7 19.9(L) - -   TCO2 22 - 32 mmol/L 21(L) 22 21(L) - -   ACIDBASEDEF 0.0 - 2.0 mmol/L 6.0(H) 4.0(H) 5.0(H) - -   O2SAT % 90.0 94.0 96.0 75.9 70.0        Exercise Target Goals: Exercise Program Goal: Individual exercise prescription set using results from initial 6 min walk test and THRR while considering  patient's activity barriers and safety.   Exercise Prescription Goal: Initial exercise prescription builds to 30-45 minutes a day of aerobic activity, 2-3 days per week.  Home exercise guidelines will be given to patient during  program as part of exercise prescription that the participant will acknowledge.   Education: Aerobic Exercise: - Group verbal and visual presentation on the components of exercise prescription. Introduces F.I.T.T principle from ACSM for exercise prescriptions.  Reviews F.I.T.T. principles of aerobic exercise including progression. Written material given at graduation. Flowsheet Row Cardiac Rehab from 04/15/2021 in [facility] Cardiac and Pulmonary Rehab  Education need identified 03/24/21       Education: Resistance Exercise: - Group verbal and visual presentation on the components of exercise prescription. Introduces F.I.T.T principle from ACSM for exercise prescriptions  Reviews F.I.T.T. principles of resistance exercise including progression. Written material given at graduation.    Education: Exercise & Equipment Safety: - Individual verbal instruction and demonstration of equipment use and safety with use of the equipment. Flowsheet Row Cardiac Rehab from 04/15/2021 in [facility] Cardiac and Pulmonary Rehab  Date 03/24/21  Educator [facility]  Instruction Review Code 1- Verbalizes Understanding       Education: Exercise Physiology & General Exercise Guidelines: - Group verbal and written instruction with models to review the exercise physiology of the cardiovascular system and associated critical values. Provides general exercise guidelines with specific guidelines to those with heart or lung disease.    Education: Flexibility, Balance, Mind/Body Relaxation: - Group verbal and visual presentation with interactive activity on the components of exercise prescription. Introduces F.I.T.T principle from ACSM for exercise prescriptions. Reviews F.I.T.T. principles of flexibility and balance exercise training including progression. Also discusses the mind body connection.  Reviews various relaxation techniques to help reduce and manage stress (i.e. Deep breathing, progressive muscle relaxation, and  visualization). Balance handout provided to take home. Written material given at graduation.   Activity Barriers & Risk Stratification:  Activity Barriers & Cardiac Risk Stratification - 03/24/21 1144       Activity Barriers & Cardiac Risk Stratification   Activity Barriers Joint Problems;Deconditioning;Muscular Weakness;Shortness of Breath;Balance Concerns    Cardiac Risk Stratification High             6 Minute Walk:  6 Minute Walk     Row Name 03/24/21 1144         6 Minute Walk   Phase Initial     Distance 800 feet     Walk Time 6 minutes     # of Rest Breaks 0     MPH 1.52     METS 1.54     RPE 13     Perceived Dyspnea  2     VO2 Peak 5.39     Symptoms Yes (comment)     Comments SOB,  fatigue     Resting HR 63 bpm     Resting BP 142/80     Resting Oxygen Saturation  97 %     Exercise Oxygen Saturation  during 6 min walk 98 %     Max Ex. HR 109 bpm     Max Ex. BP 194/96     2 Minute Post BP 150/82              Oxygen Initial Assessment:   Oxygen Re-Evaluation:   Oxygen Discharge (Final Oxygen Re-Evaluation):   Initial Exercise Prescription:  Initial Exercise Prescription - 03/24/21 1100       Date of Initial Exercise RX and Referring Provider   Date 03/24/21    Referring Provider [name] MD      Recumbant Bike   Level 1    RPM 50    Watts 10    Minutes 15    METs 2      NuStep   Level 1    SPM 80    Minutes 15    METs 2      Track   Laps 20    Minutes 15    METs 2.1      Prescription Details   Frequency (times per week) 3    Duration Progress to 30 minutes of continuous aerobic without signs/symptoms of physical distress      Intensity   THRR 40-80% of Max Heartrate 93-122    Ratings of Perceived Exertion 11-13    Perceived Dyspnea 0-4      Progression   Progression Continue to progress workloads to maintain intensity without signs/symptoms of physical distress.      Resistance Training   Training  Prescription Yes    Weight 3 lb    Reps 10-15             Perform Capillary Blood Glucose checks as needed.  Exercise Prescription Changes:   Exercise Prescription Changes     Row Name 03/24/21 1100 03/31/21 1500 04/14/21 1300 04/27/21 1800 04/28/21 1500     Response to Exercise   Blood Pressure (Admit) 142/80 134/82 118/62 -- 124/82   Blood Pressure (Exercise) 194/96 142/80 120/62 -- --   Blood Pressure (Exit) 136/76 128/82 120/60 -- 134/72   Heart Rate (Admit) 63 bpm 64 bpm 68 bpm -- 66 bpm   Heart Rate (Exercise) 109 bpm 85 bpm 80 bpm -- 98 bpm   Heart Rate (Exit) 54 bpm 69 bpm 59 bpm -- 60 bpm   Oxygen Saturation (Admit) 97 % -- -- -- --   Oxygen Saturation (Exercise) 98 % -- -- -- --   Rating of Perceived Exertion (Exercise) 13 13 13  -- 13   Perceived Dyspnea (Exercise) 2 -- -- -- --   Symptoms SOB, fatigue SOB, fatigue none -- none   Comments walk test results first full day of exercise -- -- --   Duration -- Progress to 30 minutes of  aerobic without signs/symptoms of physical distress Continue with 30 min of aerobic exercise without signs/symptoms of physical distress. -- Continue with 30 min of aerobic exercise without signs/symptoms of physical distress.   Intensity -- THRR unchanged THRR unchanged -- THRR unchanged     Progression   Progression -- Continue to progress workloads to maintain intensity without signs/symptoms of physical distress. Continue to progress workloads to maintain intensity without signs/symptoms of physical distress. -- Continue to progress workloads to maintain intensity without signs/symptoms of physical distress.  Average METs -- 2.2 2.55 -- 2.8     Resistance Training   Training Prescription -- Yes Yes -- Yes   Weight -- 3 lb 3 lb -- 4 lb   Reps -- 10-15 10-15 -- 10-15     Interval Training   Interval Training -- No No -- No     NuStep   Level -- 1 1 -- 3   Minutes -- 30 30 -- 30   METs -- 2.2 2.55 -- 2.8     Home Exercise Plan    Plans to continue exercise at -- -- -- Home (comment)  walking, Youtube videos Home (comment)  walking, Youtube videos   Frequency -- -- -- Add 2 additional days to program exercise sessions.  Start with 1 day Add 2 additional days to program exercise sessions.  Start with 1 day   Initial Home Exercises Provided -- -- -- 04/27/21 04/27/21    Row Name 05/14/21 0800             Response to Exercise   Blood Pressure (Admit) 118/60       Blood Pressure (Exit) 130/66       Heart Rate (Admit) 55 bpm       Heart Rate (Exercise) 70 bpm       Heart Rate (Exit) 59 bpm       Rating of Perceived Exertion (Exercise) 13       Symptoms none       Duration Continue with 30 min of aerobic exercise without signs/symptoms of physical distress.       Intensity THRR unchanged               Progression     Progression Continue to progress workloads to maintain intensity without signs/symptoms of physical distress.       Average METs 2.65               Resistance Training     Training Prescription Yes       Weight 4 lb       Reps 10-15               Interval Training     Interval Training No               NuStep     Level 4       Minutes 30       METs 3.1               Home Exercise Plan     Plans to continue exercise at Home (comment)  walking, Youtube videos       Frequency Add 2 additional days to program exercise sessions.  Start with 1 day       Initial Home Exercises Provided 04/27/21               Exercise Comments:   Exercise Goals and Review:   Exercise Goals     Row Name 03/24/21 1147             Exercise Goals   Increase Physical Activity Yes       Intervention Provide advice, education, support and counseling about physical activity/exercise needs.;Develop an individualized exercise prescription for aerobic and resistive training based on initial evaluation findings, risk stratification, comorbidities and participant's personal goals.       Expected  Outcomes Short Term: Attend rehab on a regular basis to increase amount of physical activity.;Long Term: Add in home  exercise to make exercise part of routine and to increase amount of physical activity.;Long Term: Exercising regularly at least 3-5 days a week.       Increase Strength and Stamina Yes       Intervention Provide advice, education, support and counseling about physical activity/exercise needs.;Develop an individualized exercise prescription for aerobic and resistive training based on initial evaluation findings, risk stratification, comorbidities and participant's personal goals.       Expected Outcomes Short Term: Increase workloads from initial exercise prescription for resistance, speed, and METs.;Short Term: Perform resistance training exercises routinely during rehab and add in resistance training at home;Long Term: Improve cardiorespiratory fitness, muscular endurance and strength as measured by increased METs and functional capacity (6MWT)       Able to understand and use rate of perceived exertion (RPE) scale Yes       Intervention Provide education and explanation on how to use RPE scale       Expected Outcomes Short Term: Able to use RPE daily in rehab to express subjective intensity level;Long Term:  Able to use RPE to guide intensity level when exercising independently       Able to understand and use Dyspnea scale Yes       Intervention Provide education and explanation on how to use Dyspnea scale       Expected Outcomes Short Term: Able to use Dyspnea scale daily in rehab to express subjective sense of shortness of breath during exertion;Long Term: Able to use Dyspnea scale to guide intensity level when exercising independently       Knowledge and understanding of Target Heart Rate Range (THRR) Yes       Intervention Provide education and explanation of THRR including how the numbers were predicted and where they are located for reference       Expected Outcomes Short Term:  Able to state/look up THRR;Long Term: Able to use THRR to govern intensity when exercising independently;Short Term: Able to use daily as guideline for intensity in rehab       Able to check pulse independently Yes       Intervention Provide education and demonstration on how to check pulse in carotid and radial arteries.;Review the importance of being able to check your own pulse for safety during independent exercise       Expected Outcomes Short Term: Able to explain why pulse checking is important during independent exercise;Long Term: Able to check pulse independently and accurately       Understanding of Exercise Prescription Yes       Intervention Provide education, explanation, and written materials on patient's individual exercise prescription       Expected Outcomes Short Term: Able to explain program exercise prescription;Long Term: Able to explain home exercise prescription to exercise independently                Exercise Goals Re-Evaluation :  Exercise Goals Re-Evaluation     Row Name 03/30/21 1745 04/14/21 1305 04/27/21 1803 05/14/21 0847 05/18/21 1730     Exercise Goal Re-Evaluation   Exercise Goals Review Increase Physical Activity;Able to understand and use rate of perceived exertion (RPE) scale;Knowledge and understanding of Target Heart Rate Range (THRR);Understanding of Exercise Prescription;Increase Strength and Stamina;Able to check pulse independently Increase Physical Activity;Increase Strength and Stamina Increase Physical Activity;Increase Strength and Stamina Increase Physical Activity;Increase Strength and Stamina Increase Physical Activity;Increase Strength and Stamina;Understanding of Exercise Prescription   Comments Reviewed RPE and dyspnea scales, THR and program prescription  with pt today.  Pt voiced understanding and was given a copy of goals to take home. [place] can do 30 min on NS.  Staff will encourage trying to do some laps on track and increasing level on  NS. Reviewed home exercise with pt today.  Pt plans to walk and complete Youtube staff videos for exercise.  Reviewed THR, pulse, RPE, sign and symptoms, pulse oximetery and when to call 911 or MD.  Also discussed weather considerations and indoor options.  Pt voiced understanding. Aksh is doing well in rehab. He has physical limitations that do not allow him to walk but has increased his level on the T4 to level 4. Will continue to increase load and monitor progress. Winnie reports that he feels like he is getting stronger with his exercise and has progressed on his T4 NS level since starting the program. While he is limitied with walking he does try to do some walking with breaks as needed at home and is doing strength and stretching exercises at home on off days of rehab, following the home exercise guidelines provided by staff.   Expected Outcomes Short: Use RPE daily to regulate intensity. Long: Follow program prescription in THR. Short: try track/increase level on NS Long: improve overall stamina Short: Add on 1 day of exercise at home and monitor HR Long: Continue to exercise at home independently at appropriate exercise prescription Short: Continue building up tolerance on T4 Long: Continue to increase overall strength and stamina Short: continue to exercise at home and progress with intensity levels on T4 in cardiac rehab class. Long: become independent with exercise program and continue with exercise at home once he is discharged from program.            Discharge Exercise Prescription (Final Exercise Prescription Changes):  Exercise Prescription Changes - 05/14/21 0800       Response to Exercise   Blood Pressure (Admit) 118/60    Blood Pressure (Exit) 130/66    Heart Rate (Admit) 55 bpm    Heart Rate (Exercise) 70 bpm    Heart Rate (Exit) 59 bpm    Rating of Perceived Exertion (Exercise) 13    Symptoms none    Duration Continue with 30 min of aerobic exercise without signs/symptoms of  physical distress.    Intensity THRR unchanged      Progression   Progression Continue to progress workloads to maintain intensity without signs/symptoms of physical distress.    Average METs 2.65      Resistance Training   Training Prescription Yes    Weight 4 lb    Reps 10-15      Interval Training   Interval Training No      NuStep   Level 4    Minutes 30    METs 3.1      Home Exercise Plan   Plans to continue exercise at Home (comment)   walking, Youtube videos   Frequency Add 2 additional days to program exercise sessions.   Start with 1 day   Initial Home Exercises Provided 04/27/21             Nutrition:  Target Goals: Understanding of nutrition guidelines, daily intake of sodium 1500mg , cholesterol 200mg , calories 30% from fat and 7% or less from saturated fats, daily to have 5 or more servings of fruits and vegetables.  Education: All About Nutrition: -Group instruction provided by verbal, written material, interactive activities, discussions, models, and posters to present general guidelines for heart healthy  nutrition including fat, fiber, MyPlate, the role of sodium in heart healthy nutrition, utilization of the nutrition label, and utilization of this knowledge for meal planning. Follow up email sent as well. Written material given at graduation. Flowsheet Row Cardiac Rehab from 04/15/2021 in [facility] Cardiac and Pulmonary Rehab  Education need identified 03/24/21       Biometrics:  Pre Biometrics - 03/24/21 1244       Pre Biometrics   Height 5' 8.6" (1.742 m)    Weight 228 lb 1.6 oz (103.5 kg)    BMI (Calculated) 34.1    Single Leg Stand 1.2 seconds              Nutrition Therapy Plan and Nutrition Goals:   Nutrition Assessments:  MEDIFICTS Score Key: ?70 Need to make dietary changes  40-70 Heart Healthy Diet ? 40 Therapeutic Level Cholesterol Diet  Flowsheet Row Cardiac Rehab from 03/24/2021 in [facility] Cardiac and Pulmonary Rehab  Picture  Your Plate Total Score on Admission 80      Picture Your Plate Scores: <95 Unhealthy dietary pattern with much room for improvement. 41-50 Dietary pattern unlikely to meet recommendations for good health and room for improvement. 51-60 More healthful dietary pattern, with some room for improvement.  >60 Healthy dietary pattern, although there may be some specific behaviors that could be improved.    Nutrition Goals Re-Evaluation:  Nutrition Goals Re-Evaluation     [place] Name 04/16/21 1742 05/18/21 1739           Goals   Current Weight 228 lb (103.4 kg) --      Nutrition Goal Possible snacking or eating a little more. --      Comment Athony states that he is meeting with the dietician on 04/27/2021. He is not sure if he is eating enough food. He wants to lose some weight. He feels like he has some more energy since he started but would like to have more. Informed him to make a 3 day log of what he is eating to get an idea of his nutrition habits. [place]ham has missed several appointments with program dietician. He has been encouraged to reschedule his appointment in order to set specific nutrition goals.      Expected Outcome Short: meet with the dietician. Long: maintain a healthy diet that pertains to his needs. Short: meet with the dietician. Long: maintain a healthy diet that pertains to his needs.               Nutrition Goals Discharge (Final Nutrition Goals Re-Evaluation):  Nutrition Goals Re-Evaluation - 05/18/21 1739       Goals   Comment Norvin has missed several appointments with program dietician. He has been encouraged to reschedule his appointment in order to set specific nutrition goals.    Expected Outcome Short: meet with the dietician. Long: maintain a healthy diet that pertains to his needs.             Psychosocial: Target Goals: Acknowledge presence or absence of significant depression and/or stress, maximize coping skills, provide positive support system.  Participant is able to verbalize types and ability to use techniques and skills needed for reducing stress and depression.   Education: Stress, Anxiety, and Depression - Group verbal and visual presentation to define topics covered.  Reviews how body is impacted by stress, anxiety, and depression.  Also discusses healthy ways to reduce stress and to treat/manage anxiety and depression.  Written material given at graduation. Flowsheet Row Cardiac  Rehab from 04/15/2021 in [facility] Cardiac and Pulmonary Rehab  Date 04/15/21  Educator AS  Instruction Review Code 1- Verbalizes Understanding       Education: Sleep Hygiene -Provides group verbal and written instruction about how sleep can affect your health.  Define sleep hygiene, discuss sleep cycles and impact of sleep habits. Review good sleep hygiene tips.    Initial Review & Psychosocial Screening:  Initial Psych Review & Screening - 03/18/21 1039       Initial Review   Current issues with None Identified      Family Dynamics   Good Support System? Yes   family     Barriers   Psychosocial barriers to participate in program There are no identifiable barriers or psychosocial needs.;The patient should benefit from training in stress management and relaxation.      Screening Interventions   Interventions Encouraged to exercise;Provide feedback about the scores to participant;To provide support and resources with identified psychosocial needs    Expected Outcomes Short Term goal: Utilizing psychosocial counselor, staff and physician to assist with identification of specific Stressors or current issues interfering with healing process. Setting desired goal for each stressor or current issue identified.;Long Term Goal: Stressors or current issues are controlled or eliminated.;Short Term goal: Identification and review with participant of any Quality of Life or Depression concerns found by scoring the questionnaire.;Long Term goal: The participant  improves quality of Life and PHQ9 Scores as seen by post scores and/or verbalization of changes             Quality of Life Scores:   Quality of Life - 03/24/21 1245       Quality of Life   Select Quality of Life      Quality of Life Scores   Health/Function Pre 26.26 %    Socioeconomic Pre 29.5 %    Psych/Spiritual Pre 28.8 %    Family Pre 27.6 %    GLOBAL Pre 27.78 %            Scores of 19 and below usually indicate a poorer quality of life in these areas.  A difference of  2-3 points is a clinically meaningful difference.  A difference of 2-3 points in the total score of the Quality of Life Index has been associated with significant improvement in overall quality of life, self-image, physical symptoms, and general health in studies assessing change in quality of life.  PHQ-9: Recent Review Flowsheet Data     Depression screen [facility] 2/9 03/24/2021   Decreased Interest 0   Down, Depressed, Hopeless 0   PHQ - 2 Score 0   Altered sleeping 0   Tired, decreased energy 0   Change in appetite 0   Feeling bad or failure about yourself  0   Trouble concentrating 0   Moving slowly or fidgety/restless 0   Suicidal thoughts 0   PHQ-9 Score 0   Difficult doing work/chores Not difficult at all      Interpretation of Total Score  Total Score Depression Severity:  1-4 = Minimal depression, 5-9 = Mild depression, 10-14 = Moderate depression, 15-19 = Moderately severe depression, 20-27 = Severe depression   Psychosocial Evaluation and Intervention:  Psychosocial Evaluation - 03/18/21 1053       Psychosocial Evaluation & Interventions   Interventions Encouraged to exercise with the program and follow exercise prescription    Comments Mr. 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 He said he is  eating and sleeping well. He is still doing what he wants to do during the day. He does have a copay which will most likely hinder the length of his attendance.  While he is in the program he wants to work on his diabetes and maintaining his independence.    Expected Outcomes Short: attend cardiac rehab for education and exercise. Long: develop positive self care habits.    Continue Psychosocial Services  Follow up required by staff             Psychosocial Re-Evaluation:  Psychosocial Re-Evaluation     [place] Name 04/16/21 1745 05/18/21 1729           Psychosocial Re-Evaluation   Current issues with None Identified None Identified      Comments Patient reports no issues with their current mental states, sleep, stress, depression or anxiety. Will follow up with patient in a few weeks for any changes. Lyric has 5 kids and 22 grandkids. He sees his grandkids often. Patient reports no issues with their current mental states, sleep, stress, depression or anxiety. Will follow up with patient in a few weeks for any changes. Patient reports that he has a strong family support system.      Expected Outcomes Short: Continue to exercise regularly to support mental health and notify staff of any changes. Long: maintain mental health and well being through teaching of rehab or prescribed medications independently. Short: Continue to exercise regularly to support mental health and notify staff of any changes. Long: maintain mental health and well being through teaching of rehab or prescribed medications independently.      Interventions Encouraged to attend Cardiac Rehabilitation for the exercise Encouraged to attend Cardiac Rehabilitation for the exercise      Continue Psychosocial Services  Follow up required by staff Follow up required by staff               Psychosocial Discharge (Final Psychosocial Re-Evaluation):  Psychosocial Re-Evaluation - 05/18/21 1729       Psychosocial Re-Evaluation   Current issues with None Identified    Comments Patient reports no issues with their current mental states, sleep, stress, depression or anxiety. Will follow up  with patient in a few weeks for any changes. Patient reports that he has a strong family support system.    Expected Outcomes Short: Continue to exercise regularly to support mental health and notify staff of any changes. Long: maintain mental health and well being through teaching of rehab or prescribed medications independently.    Interventions Encouraged to attend Cardiac Rehabilitation for the exercise    Continue Psychosocial Services  Follow up required by staff             Vocational Rehabilitation: Provide vocational rehab assistance to qualifying candidates.   Vocational Rehab Evaluation & Intervention:  Vocational Rehab - 03/18/21 1053       Initial Vocational Rehab Evaluation & Intervention   Assessment shows need for Vocational Rehabilitation No             Education: Education Goals: Education classes will be provided on a variety of topics geared toward better understanding of heart health and risk factor modification. Participant will state understanding/return demonstration of topics presented as noted by education test scores.  Learning Barriers/Preferences:  Learning Barriers/Preferences - 03/18/21 1040       Learning Barriers/Preferences   Learning Barriers None    Learning Preferences None  General Cardiac Education Topics:  AED/CPR: - Group verbal and written instruction with the use of models to demonstrate the basic use of the AED with the basic ABC's of resuscitation.   Anatomy and Cardiac Procedures: - Group verbal and visual presentation and models provide information about basic cardiac anatomy and function. Reviews the testing methods done to diagnose heart disease and the outcomes of the test results. Describes the treatment choices: Medical Management, Angioplasty, or Coronary Bypass Surgery for treating various heart conditions including Myocardial Infarction, Angina, Valve Disease, and Cardiac Arrhythmias.  Written  material given at graduation. Flowsheet Row Cardiac Rehab from 04/15/2021 in [facility] Cardiac and Pulmonary Rehab  Education need identified 03/24/21       Medication Safety: - Group verbal and visual instruction to review commonly prescribed medications for heart and lung disease. Reviews the medication, class of the drug, and side effects. Includes the steps to properly store meds and maintain the prescription regimen.  Written material given at graduation.   Intimacy: - Group verbal instruction through game format to discuss how heart and lung disease can affect sexual intimacy. Written material given at graduation..   Know Your Numbers and Heart Failure: - Group verbal and visual instruction to discuss disease risk factors for cardiac and pulmonary disease and treatment options.  Reviews associated critical values for Overweight/Obesity, Hypertension, Cholesterol, and Diabetes.  Discusses basics of heart failure: signs/symptoms and treatments.  Introduces Heart Failure Zone chart for action plan for heart failure.  Written material given at graduation. Flowsheet Row Cardiac Rehab from 04/15/2021 in [facility] Cardiac and Pulmonary Rehab  Date 04/01/21  Educator [facility]  Instruction Review Code 1- Verbalizes Understanding       Infection Prevention: - Provides verbal and written material to individual with discussion of infection control including proper hand washing and proper equipment cleaning during exercise session. Flowsheet Row Cardiac Rehab from 04/15/2021 in [facility] Cardiac and Pulmonary Rehab  Date 03/24/21  Educator [facility]  Instruction Review Code 1- Verbalizes Understanding       Falls Prevention: - Provides verbal and written material to individual with discussion of falls prevention and safety. Flowsheet Row Cardiac Rehab from 04/15/2021 in [facility] Cardiac and Pulmonary Rehab  Date 03/24/21  Educator [facility]  Instruction Review Code 1- Verbalizes Understanding       Other: -Provides group  and verbal instruction on various topics (see comments)   Knowledge Questionnaire Score:  Knowledge Questionnaire Score - 03/24/21 1246       Knowledge Questionnaire Score   Pre Score 19/26 Education Focus: nutrition, exercise, angina, cessation             Core Components/Risk Factors/Patient Goals at Admission:  Personal Goals and Risk Factors at Admission - 03/24/21 1246       Core Components/Risk Factors/Patient Goals on Admission    Weight Management Yes;Obesity;Weight Loss    Intervention Weight Management: Develop a combined nutrition and exercise program designed to reach desired caloric intake, while maintaining appropriate intake of nutrient and fiber, sodium and fats, and appropriate energy expenditure required for the weight goal.;Weight Management: Provide education and appropriate resources to help participant work on and attain dietary goals.;Weight Management/Obesity: Establish reasonable short term and long term weight goals.;Obesity: Provide education and appropriate resources to help participant work on and attain dietary goals.    Admit Weight 228 lb 1.6 oz (103.5 kg)    Goal Weight: Short Term 223 lb (101.2 kg)    Goal Weight: Long Term 220 lb (99.8 kg)  Expected Outcomes Short Term: Continue to assess and modify interventions until short term weight is achieved;Long Term: Adherence to nutrition and physical activity/exercise program aimed toward attainment of established weight goal;Weight Loss: Understanding of general recommendations for a balanced deficit meal plan, which promotes 1-2 lb weight loss per week and includes a negative energy balance of [phone] kcal/d;Understanding recommendations for meals to include 15-35% energy as protein, 25-35% energy from fat, 35-60% energy from carbohydrates, less than 200mg  of dietary cholesterol, 20-35 gm of total fiber daily;Understanding of distribution of calorie intake throughout the day with the consumption of 4-5  meals/snacks    Diabetes Yes    Intervention Provide education about signs/symptoms and action to take for hypo/hyperglycemia.;Provide education about proper nutrition, including hydration, and aerobic/resistive exercise prescription along with prescribed medications to achieve blood glucose in normal ranges: Fasting glucose 65-99 mg/dL    Expected Outcomes Short Term: Participant verbalizes understanding of the signs/symptoms and immediate care of hyper/hypoglycemia, proper foot care and importance of medication, aerobic/resistive exercise and nutrition plan for blood glucose control.;Long Term: Attainment of HbA1C < 7%.    Hypertension Yes    Intervention Provide education on lifestyle modifcations including regular physical activity/exercise, weight management, moderate sodium restriction and increased consumption of fresh fruit, vegetables, and low fat dairy, alcohol moderation, and smoking cessation.;Monitor prescription use compliance.    Expected Outcomes Short Term: Continued assessment and intervention until BP is < 140/45mm HG in hypertensive participants. < 130/29mm HG in hypertensive participants with diabetes, heart failure or chronic kidney disease.;Long Term: Maintenance of blood pressure at goal levels.    Lipids Yes    Intervention Provide education and support for participant on nutrition & aerobic/resistive exercise along with prescribed medications to achieve LDL 70mg , HDL >40mg .    Expected Outcomes Short Term: Participant states understanding of desired cholesterol values and is compliant with medications prescribed. Participant is following exercise prescription and nutrition guidelines.;Long Term: Cholesterol controlled with medications as prescribed, with individualized exercise RX and with personalized nutrition plan. Value goals: LDL < 70mg , HDL > 40 mg.             Education:Diabetes - Individual verbal and written instruction to review signs/symptoms of diabetes,  desired ranges of glucose level fasting, after meals and with exercise. Acknowledge that pre and post exercise glucose checks will be done for 3 sessions at entry of program. [place] from 04/15/2021 in [facility] Cardiac and Pulmonary Rehab  Date 03/18/21  Educator [facility]  Instruction Review Code 1- Verbalizes Understanding       Core Components/Risk Factors/Patient Goals Review:   Goals and Risk Factor Review     Row Name 04/16/21 1739 05/18/21 1734           Core Components/Risk Factors/Patient Goals Review   Personal Goals Review Weight Management/Obesity;Diabetes Weight Management/Obesity;Diabetes      Review Roddrick states that his blood sugars have been good at home between 120 and 130 fasting. He is still trying to lose a couple pounds. He wants to reach a weight goal of 200 pounds. Informed patient that he needs to figure out if what he is eating is going to help him lose weight. Sankalp reports that he continues to check his blood sugars at home and they remain withing acceptable ranges. He stated that he has not seen too much of a change in in his weight since starting the program. He has been consistent with exercise but was encouraged to reschedule his dietician appointment that was  missed in order to work on [place] goals.      Expected Outcomes Short: lose some weight. Long: lose 10 pounds in the program. Short: meet with dietican to to discuss nutition aspect with reguards to weight loss. Long: lose 10 pounsd in the program.               Core Components/Risk Factors/Patient Goals at Discharge (Final Review):   Goals and Risk Factor Review - 05/18/21 1734       Core Components/Risk Factors/Patient Goals Review   Personal Goals Review Weight Management/Obesity;Diabetes    Review Tedric reports that he continues to check his blood sugars at home and they remain withing acceptable ranges. He stated that he has not seen too much of a change in in his weight since  starting the program. He has been consistent with exercise but was encouraged to reschedule his dietician appointment that was missed in order to work on nutrician goals.    Expected Outcomes Short: meet with dietican to to discuss nutition aspect with reguards to weight loss. Long: lose 10 pounsd in the program.             ITP Comments:  ITP Comments     Row Name 03/18/21 1059 03/24/21 1123 03/25/21 0719 03/30/21 1745 04/22/21 0747   ITP Comments Initial telephone orientation completed. Diagnosis can be found in [facility] 4/22. EP orientation scheduled for Tuesday 5/17 at 10am. Completed 6MWT and gym orientation. Initial ITP created and sent for review to Dr. Emily Filbert, Medical Director. 30 Day review completed. Medical Director ITP review done, changes made as directed, and signed approval by Medical Director.   New to program First full day of exercise!  Patient was oriented to gym and equipment including functions, settings, policies, and procedures.  Patient's individual exercise prescription and treatment plan were reviewed.  All starting workloads were established based on the results of the 6 minute walk test done at initial orientation visit.  The plan for exercise progression was also introduced and progression will be customized based on patient's performance and goals 30 Day review completed. Medical Director ITP review done, changes made as directed, and signed approval by Medical Director.    [place] Name 05/20/21 0840           ITP Comments 30 Day review completed. Medical Director ITP review done, changes made as directed, and signed approval by Medical Director.                Comments:

## 2021-05-21 ENCOUNTER — Encounter: Payer: Medicare HMO | Admitting: *Deleted

## 2021-05-21 ENCOUNTER — Other Ambulatory Visit: Payer: Self-pay

## 2021-05-21 DIAGNOSIS — I214 Non-ST elevation (NSTEMI) myocardial infarction: Secondary | ICD-10-CM

## 2021-05-21 DIAGNOSIS — Z48812 Encounter for surgical aftercare following surgery on the circulatory system: Secondary | ICD-10-CM | POA: Diagnosis not present

## 2021-05-21 NOTE — Progress Notes (Signed)
Daily Session Note  Patient Details  Name: [name] MRN: [id number] Date of Birth: 12/03/1934 Referring Provider:   Flowsheet Row Cardiac Rehab from 03/24/2021 in [facility] Cardiac and Pulmonary Rehab  Referring Provider [name] MD       Encounter Date: 05/21/2021  Check In:  Session Check In - 05/21/21 1712       Check-In   Supervising physician immediately available to respond to emergencies See telemetry face sheet for immediately available ER MD    Location [facility]-Cardiac & Pulmonary Rehab    Staff Present Renita Papa, RN BSN;Joseph [place], RCP,RRT,BSRT;Kara [place], MS, ASCM CEP, Exercise Physiologist;Melissa Caiola, RDN, LDN    Virtual Visit No    Medication changes reported     No    Fall or balance concerns reported    No    Warm-up and Cool-down Performed on first and last piece of equipment    Resistance Training Performed Yes    VAD Patient? No    PAD/SET Patient? No      Pain Assessment   Currently in Pain? No/denies                Social History   Tobacco Use  Smoking Status Former   Types: Cigarettes   Quit date: 07/26/1996   Years since quitting: 24.8  Smokeless Tobacco Never    Goals Met:  Independence with exercise equipment Exercise tolerated well No report of cardiac concerns or symptoms Strength training completed today  Goals Unmet:  Not Applicable  Comments: Pt able to follow exercise prescription today without complaint.  Will continue to monitor for progression.    Dr. Emily Filbert is Medical Director for [place].  Dr. Ottie Glazier is Medical Director for [facility] Pulmonary Rehabilitation.

## 2021-05-25 ENCOUNTER — Encounter: Payer: Medicare HMO | Admitting: *Deleted

## 2021-05-25 DIAGNOSIS — Z951 Presence of aortocoronary bypass graft: Secondary | ICD-10-CM

## 2021-05-25 DIAGNOSIS — Z48812 Encounter for surgical aftercare following surgery on the circulatory system: Secondary | ICD-10-CM | POA: Diagnosis not present

## 2021-05-25 DIAGNOSIS — I214 Non-ST elevation (NSTEMI) myocardial infarction: Secondary | ICD-10-CM

## 2021-05-25 NOTE — Progress Notes (Signed)
Daily Session Note  Patient Details  Name: [name] MRN: [id number] Date of Birth: February 27, 1935 Referring Provider:   Flowsheet Row Cardiac Rehab from 03/24/2021 in [facility] Cardiac and Pulmonary Rehab  Referring Provider [name] MD       Encounter Date: 05/25/2021  Check In:  Session Check In - 05/25/21 1738       Check-In   Supervising physician immediately available to respond to emergencies See telemetry face sheet for immediately available ER MD    Location [facility]-Cardiac & Pulmonary Rehab    Staff Present Heath Lark, RN, BSN, Jacklynn Bue, MS, ASCM CEP, Exercise Physiologist;Melissa [place], RDN, LDN;Joseph [place], RCP,RRT,BSRT    Virtual Visit No    Medication changes reported     No    Fall or balance concerns reported    No    Warm-up and Cool-down Performed on first and last piece of equipment    Resistance Training Performed Yes    VAD Patient? No    PAD/SET Patient? No      Pain Assessment   Currently in Pain? No/denies                Social History   Tobacco Use  Smoking Status Former   Types: Cigarettes   Quit date: 07/26/1996   Years since quitting: 24.8  Smokeless Tobacco Never    Goals Met:  Independence with exercise equipment Exercise tolerated well No report of cardiac concerns or symptoms  Goals Unmet:  Not Applicable  Comments: Pt able to follow exercise prescription today without complaint.  Will continue to monitor for progression.    Dr. Emily Filbert is Medical Director for [place].  Dr. Ottie Glazier is Medical Director for [facility] Pulmonary Rehabilitation.

## 2021-05-27 ENCOUNTER — Other Ambulatory Visit: Payer: Self-pay

## 2021-05-27 DIAGNOSIS — Z951 Presence of aortocoronary bypass graft: Secondary | ICD-10-CM

## 2021-05-27 DIAGNOSIS — Z48812 Encounter for surgical aftercare following surgery on the circulatory system: Secondary | ICD-10-CM | POA: Diagnosis not present

## 2021-05-27 DIAGNOSIS — I214 Non-ST elevation (NSTEMI) myocardial infarction: Secondary | ICD-10-CM

## 2021-05-27 NOTE — Progress Notes (Signed)
Daily Session Note  Patient Details  Name: [name] MRN: [id number] Date of Birth: [birth year] Referring Provider:   Flowsheet Row Cardiac Rehab from 03/24/2021 in [facility] Cardiac and Pulmonary Rehab  Referring Provider [name] MD       Encounter Date: 05/27/2021  Check In:  Session Check In - 05/27/21 1722       Check-In   Supervising physician immediately available to respond to emergencies See telemetry face sheet for immediately available ER MD    Location [facility]-Cardiac & Pulmonary Rehab    Staff Present Birdie Sons, MPA, Nino Glow, MS, ASCM CEP, Exercise Physiologist;Joseph Tessie Fass, [place]    Virtual Visit No    Medication changes reported     No    Fall or balance concerns reported    No    Warm-up and Cool-down Performed on first and last piece of equipment    Resistance Training Performed Yes    VAD Patient? No    PAD/SET Patient? No      Pain Assessment   Currently in Pain? No/denies                Social History   Tobacco Use  Smoking Status Former   Types: Cigarettes   Quit date: 07/26/1996   Years since quitting: 24.8  Smokeless Tobacco Never    Goals Met:  Independence with exercise equipment Exercise tolerated well No report of cardiac concerns or symptoms Strength training completed today  Goals Unmet:  Not Applicable  Comments: Pt able to follow exercise prescription today without complaint.  Will continue to monitor for progression.    Dr. Emily Filbert is Medical Director for [place].  Dr. Ottie Glazier is Medical Director for [facility] Pulmonary Rehabilitation.

## 2021-05-28 ENCOUNTER — Encounter: Payer: Medicare HMO | Admitting: *Deleted

## 2021-05-28 DIAGNOSIS — Z951 Presence of aortocoronary bypass graft: Secondary | ICD-10-CM

## 2021-05-28 DIAGNOSIS — Z48812 Encounter for surgical aftercare following surgery on the circulatory system: Secondary | ICD-10-CM | POA: Diagnosis not present

## 2021-05-28 DIAGNOSIS — I214 Non-ST elevation (NSTEMI) myocardial infarction: Secondary | ICD-10-CM

## 2021-05-28 NOTE — Progress Notes (Signed)
Daily Session Note  Patient Details  Name: [name] MRN: [id number] Date of Birth: Nov 02, 1935 Referring Provider:   Flowsheet Row Cardiac Rehab from 03/24/2021 in [facility] Cardiac and Pulmonary Rehab  Referring Provider [name] MD       Encounter Date: 05/28/2021  Check In:  Session Check In - 05/28/21 1718       Check-In   Supervising physician immediately available to respond to emergencies See telemetry face sheet for immediately available ER MD    Location [facility]-Cardiac & Pulmonary Rehab    Staff Present Renita Papa, RN BSN;Joseph Tessie Fass, RCP,RRT,BSRT;Amanda [place], [place], ACSM CEP, Exercise Physiologist    Virtual Visit No    Medication changes reported     No    Fall or balance concerns reported    No    Warm-up and Cool-down Performed on first and last piece of equipment    Resistance Training Performed Yes    VAD Patient? No    PAD/SET Patient? No      Pain Assessment   Currently in Pain? No/denies                Social History   Tobacco Use  Smoking Status Former   Types: Cigarettes   Quit date: 07/26/1996   Years since quitting: 24.8  Smokeless Tobacco Never    Goals Met:  Independence with exercise equipment Exercise tolerated well No report of cardiac concerns or symptoms Strength training completed today  Goals Unmet:  Not Applicable  Comments: Pt able to follow exercise prescription today without complaint.  Will continue to monitor for progression.    Dr. Emily Filbert is Medical Director for [place].  Dr. Ottie Glazier is Medical Director for [facility] Pulmonary Rehabilitation.

## 2021-05-31 ENCOUNTER — Other Ambulatory Visit: Payer: Self-pay | Admitting: Family

## 2021-06-04 ENCOUNTER — Other Ambulatory Visit: Payer: Self-pay | Admitting: Surgical

## 2021-06-08 ENCOUNTER — Telehealth: Payer: Self-pay

## 2021-06-08 NOTE — Telephone Encounter (Signed)
Spoke with patient who said he was exposed to [place], is experiencing symptoms and states he will be getting a test tomorrow. We will cancel his appointments this week and patient should call us with results. We will discuss when he can come back based on symptoms and results. Patient agreed.

## 2021-06-10 DIAGNOSIS — Z951 Presence of aortocoronary bypass graft: Secondary | ICD-10-CM

## 2021-06-10 DIAGNOSIS — I214 Non-ST elevation (NSTEMI) myocardial infarction: Secondary | ICD-10-CM

## 2021-06-10 NOTE — Progress Notes (Unsigned)
Abell has not attended since last review

## 2021-06-15 ENCOUNTER — Encounter: Payer: Medicare HMO | Attending: Cardiovascular Disease

## 2021-06-15 DIAGNOSIS — I214 Non-ST elevation (NSTEMI) myocardial infarction: Secondary | ICD-10-CM | POA: Insufficient documentation

## 2021-06-15 DIAGNOSIS — Z87891 Personal history of nicotine dependence: Secondary | ICD-10-CM | POA: Insufficient documentation

## 2021-06-15 DIAGNOSIS — Z951 Presence of aortocoronary bypass graft: Secondary | ICD-10-CM | POA: Insufficient documentation

## 2021-06-16 ENCOUNTER — Other Ambulatory Visit: Payer: Self-pay | Admitting: Family

## 2021-06-16 ENCOUNTER — Other Ambulatory Visit: Payer: Self-pay | Admitting: Surgical

## 2021-06-17 ENCOUNTER — Encounter: Payer: Self-pay | Admitting: *Deleted

## 2021-06-17 ENCOUNTER — Telehealth: Payer: Self-pay

## 2021-06-17 DIAGNOSIS — Z951 Presence of aortocoronary bypass graft: Secondary | ICD-10-CM

## 2021-06-17 DIAGNOSIS — I214 Non-ST elevation (NSTEMI) myocardial infarction: Secondary | ICD-10-CM

## 2021-06-17 NOTE — Progress Notes (Signed)
Cardiac Individual Treatment Plan  Patient Details  Name: [name] MRN: [id number] Date of Birth: 10-Aug-1935 Referring Provider:   Flowsheet Row Cardiac Rehab from 03/24/2021 in [facility] Cardiac and Pulmonary Rehab  Referring Provider [name] MD       Initial Encounter Date:  Flowsheet Row Cardiac Rehab from 03/24/2021 in [facility] Cardiac and Pulmonary Rehab  Date 03/24/21       Visit Diagnosis: NSTEMI (non-ST elevation myocardial infarction) ([place])  S/P CABG x 5  Patient's Home Medications on Admission:  Current Outpatient Medications:    acetaminophen (TYLENOL) 500 MG tablet, Take 500-1,000 mg by mouth every 6 (six) hours as needed for mild pain or moderate pain. , Disp: , Rfl:    acyclovir (ZOVIRAX) 800 MG tablet, Take 800 mg by mouth daily., Disp: , Rfl:    amLODipine (NORVASC) 10 MG tablet, Take 1 tablet (10 mg total) by mouth daily., Disp: 90 tablet, Rfl: 1   aspirin EC 81 MG tablet, Take 81 mg by mouth daily., Disp: , Rfl:    clopidogrel (PLAVIX) 75 MG tablet, TAKE 1 TABLET BY MOUTH EVERY DAY, Disp: 90 tablet, Rfl: 1   fluticasone (FLOVENT HFA) 44 MCG/ACT inhaler, Inhale 2 puffs into the lungs 2 (two) times daily., Disp: , Rfl:    furosemide (LASIX) 80 MG tablet, Take 1 tablet (80 mg total) by mouth daily., Disp: 90 tablet, Rfl: 2   hydrALAZINE (APRESOLINE) 25 MG tablet, Take 1 tablet (25 mg total) by mouth in the morning and at bedtime., Disp: 180 tablet, Rfl: 3   insulin detemir (LEVEMIR) 100 UNIT/ML injection, Inject 0.2 mLs (20 Units total) into the skin 2 (two) times daily., Disp: 10 mL, Rfl: 3   magnesium oxide (MAG-OX) 400 MG tablet, TAKE 1 TABLET BY MOUTH EVERY DAY, Disp: 90 tablet, Rfl: 1   Multiple Vitamin (MULTIVITAMIN WITH MINERALS) TABS tablet, Take 1 tablet by mouth daily., Disp: , Rfl:    oxybutynin (DITROPAN) 5 MG tablet, Take 5 mg by mouth 2 (two) times daily., Disp: , Rfl:    pantoprazole (PROTONIX) 40 MG tablet, Take 40 mg by mouth 2 (two) times  daily., Disp: , Rfl:    potassium chloride (KLOR-CON) 10 MEQ tablet, TAKE 1 TABLET (10 MEQ TOTAL) BY MOUTH DAILY. ONLY WHEN TAKING LASIX, Disp: 90 tablet, Rfl: 0   rosuvastatin (CRESTOR) 40 MG tablet, Take 1 tablet (40 mg total) by mouth daily., Disp: 90 tablet, Rfl: 1   spironolactone (ALDACTONE) 50 MG tablet, TAKE 1 TABLET BY MOUTH EVERY DAY, Disp: 90 tablet, Rfl: 0   traMADol (ULTRAM) 50 MG tablet, Take 1 tablet (50 mg total) by mouth every 12 (twelve) hours as needed for severe pain. Further refill request will need to be sent to primary care., Disp: 30 tablet, Rfl: 0  Past Medical History: Past Medical History:  Diagnosis Date   Diabetes mellitus without complication ([place])    Hypertension     Tobacco Use: Social History   Tobacco Use  Smoking Status Former   Types: Cigarettes   Quit date: 07/26/1996   Years since quitting: 24.9  Smokeless Tobacco Never    Labs: Recent Review Flowsheet Data     Labs for ITP Cardiac and Pulmonary Rehab Latest Ref Rng & Units 08/29/2020 08/30/2020 08/30/2020 08/31/2020 09/01/2020   Cholestrol 0 - 200 mg/dL - - - - -   LDLCALC 0 - 99 mg/dL - - - - -   HDL >40 mg/dL - - - - -   Trlycerides <  150 mg/dL - - - - -   Hemoglobin A1c 4.8 - 5.6 % - - - - -   PHART 7.350 - 7.450 7.342(L) 7.370 7.345(L) - -   PCO2ART 32.0 - 48.0 mmHg 35.8 35.6 36.1 - -   HCO3 20.0 - 28.0 mmol/L 19.7(L) 20.7 19.9(L) - -   TCO2 22 - 32 mmol/L 21(L) 22 21(L) - -   ACIDBASEDEF 0.0 - 2.0 mmol/L 6.0(H) 4.0(H) 5.0(H) - -   O2SAT % 90.0 94.0 96.0 75.9 70.0        Exercise Target Goals: Exercise Program Goal: Individual exercise prescription set using results from initial 6 min walk test and THRR while considering  patient's activity barriers and safety.   Exercise Prescription Goal: Initial exercise prescription builds to 30-45 minutes a day of aerobic activity, 2-3 days per week.  Home exercise guidelines will be given to patient during program as part of exercise  prescription that the participant will acknowledge.   Education: Aerobic Exercise: - Group verbal and visual presentation on the components of exercise prescription. Introduces F.I.T.T principle from ACSM for exercise prescriptions.  Reviews F.I.T.T. principles of aerobic exercise including progression. Written material given at graduation. Flowsheet Row Cardiac Rehab from 04/15/2021 in [facility] Cardiac and Pulmonary Rehab  Education need identified 03/24/21       Education: Resistance Exercise: - Group verbal and visual presentation on the components of exercise prescription. Introduces F.I.T.T principle from ACSM for exercise prescriptions  Reviews F.I.T.T. principles of resistance exercise including progression. Written material given at graduation.    Education: Exercise & Equipment Safety: - Individual verbal instruction and demonstration of equipment use and safety with use of the equipment. Flowsheet Row Cardiac Rehab from 04/15/2021 in [facility] Cardiac and Pulmonary Rehab  Date 03/24/21  Educator [facility]  Instruction Review Code 1- Verbalizes Understanding       Education: Exercise Physiology & General Exercise Guidelines: - Group verbal and written instruction with models to review the exercise physiology of the cardiovascular system and associated critical values. Provides general exercise guidelines with specific guidelines to those with heart or lung disease.    Education: Flexibility, Balance, Mind/Body Relaxation: - Group verbal and visual presentation with interactive activity on the components of exercise prescription. Introduces F.I.T.T principle from ACSM for exercise prescriptions. Reviews F.I.T.T. principles of flexibility and balance exercise training including progression. Also discusses the mind body connection.  Reviews various relaxation techniques to help reduce and manage stress (i.e. Deep breathing, progressive muscle relaxation, and visualization). Balance handout provided  to take home. Written material given at graduation.   Activity Barriers & Risk Stratification:  Activity Barriers & Cardiac Risk Stratification - 03/24/21 1144       Activity Barriers & Cardiac Risk Stratification   Activity Barriers Joint Problems;Deconditioning;Muscular Weakness;Shortness of Breath;Balance Concerns    Cardiac Risk Stratification High             6 Minute Walk:  6 Minute Walk     Row Name 03/24/21 1144         6 Minute Walk   Phase Initial     Distance 800 feet     Walk Time 6 minutes     # of Rest Breaks 0     MPH 1.52     METS 1.54     RPE 13     Perceived Dyspnea  2     VO2 Peak 5.39     Symptoms Yes (comment)     Comments SOB, fatigue  Resting HR 63 bpm     Resting BP 142/80     Resting Oxygen Saturation  97 %     Exercise Oxygen Saturation  during 6 min walk 98 %     Max Ex. HR 109 bpm     Max Ex. BP 194/96     2 Minute Post BP 150/82              Oxygen Initial Assessment:   Oxygen Re-Evaluation:   Oxygen Discharge (Final Oxygen Re-Evaluation):   Initial Exercise Prescription:  Initial Exercise Prescription - 03/24/21 1100       Date of Initial Exercise RX and Referring Provider   Date 03/24/21    Referring Provider [name] MD      Recumbant Bike   Level 1    RPM 50    Watts 10    Minutes 15    METs 2      NuStep   Level 1    SPM 80    Minutes 15    METs 2      Track   Laps 20    Minutes 15    METs 2.1      Prescription Details   Frequency (times per week) 3    Duration Progress to 30 minutes of continuous aerobic without signs/symptoms of physical distress      Intensity   THRR 40-80% of Max Heartrate 93-122    Ratings of Perceived Exertion 11-13    Perceived Dyspnea 0-4      Progression   Progression Continue to progress workloads to maintain intensity without signs/symptoms of physical distress.      Resistance Training   Training Prescription Yes    Weight 3 lb    Reps 10-15              Perform Capillary Blood Glucose checks as needed.  Exercise Prescription Changes:   Exercise Prescription Changes     Row Name 03/24/21 1100 03/31/21 1500 04/14/21 1300 04/27/21 1800 04/28/21 1500     Response to Exercise   Blood Pressure (Admit) 142/80 134/82 118/62 -- 124/82   Blood Pressure (Exercise) 194/96 142/80 120/62 -- --   Blood Pressure (Exit) 136/76 128/82 120/60 -- 134/72   Heart Rate (Admit) 63 bpm 64 bpm 68 bpm -- 66 bpm   Heart Rate (Exercise) 109 bpm 85 bpm 80 bpm -- 98 bpm   Heart Rate (Exit) 54 bpm 69 bpm 59 bpm -- 60 bpm   Oxygen Saturation (Admit) 97 % -- -- -- --   Oxygen Saturation (Exercise) 98 % -- -- -- --   Rating of Perceived Exertion (Exercise) '13 13 13 '$ -- 13   Perceived Dyspnea (Exercise) 2 -- -- -- --   Symptoms SOB, fatigue SOB, fatigue none -- none   Comments walk test results first full day of exercise -- -- --   Duration -- Progress to 30 minutes of  aerobic without signs/symptoms of physical distress Continue with 30 min of aerobic exercise without signs/symptoms of physical distress. -- Continue with 30 min of aerobic exercise without signs/symptoms of physical distress.   Intensity -- THRR unchanged THRR unchanged -- THRR unchanged     Progression   Progression -- Continue to progress workloads to maintain intensity without signs/symptoms of physical distress. Continue to progress workloads to maintain intensity without signs/symptoms of physical distress. -- Continue to progress workloads to maintain intensity without signs/symptoms of physical distress.   Average METs -- 2.2  2.55 -- 2.8     Resistance Training   Training Prescription -- Yes Yes -- Yes   Weight -- 3 lb 3 lb -- 4 lb   Reps -- 10-15 10-15 -- 10-15     Interval Training   Interval Training -- No No -- No     NuStep   Level -- 1 1 -- 3   Minutes -- 30 30 -- 30   METs -- 2.2 2.55 -- 2.8     Home Exercise Plan   Plans to continue exercise at -- -- -- Home  (comment)  walking, Youtube videos Home (comment)  walking, Youtube videos   Frequency -- -- -- Add 2 additional days to program exercise sessions.  Start with 1 day Add 2 additional days to program exercise sessions.  Start with 1 day   Initial Home Exercises Provided -- -- -- 04/27/21 04/27/21    Row Name 05/14/21 0800 05/27/21 1500           Response to Exercise   Blood Pressure (Admit) 118/60 136/72      Blood Pressure (Exit) 130/66 102/58      Heart Rate (Admit) 55 bpm 68 bpm      Heart Rate (Exercise) 70 bpm 78 bpm      Heart Rate (Exit) 59 bpm 58 bpm      Rating of Perceived Exertion (Exercise) 13 13      Symptoms none none      Duration Continue with 30 min of aerobic exercise without signs/symptoms of physical distress. Continue with 30 min of aerobic exercise without signs/symptoms of physical distress.      Intensity THRR unchanged THRR unchanged             Progression      Progression Continue to progress workloads to maintain intensity without signs/symptoms of physical distress. Continue to progress workloads to maintain intensity without signs/symptoms of physical distress.      Average METs 2.65 2.8             Resistance Training      Training Prescription Yes Yes      Weight 4 lb 4 lb      Reps 10-15 10-15             Interval Training      Interval Training No No             NuStep      Level 4 3      Minutes 30 30      METs 3.1 2.8             Home Exercise Plan      Plans to continue exercise at Home (comment)  walking, Youtube videos Home (comment)  walking, Youtube videos      Frequency Add 2 additional days to program exercise sessions.  Start with 1 day Add 2 additional days to program exercise sessions.  Start with 1 day      Initial Home Exercises Provided 04/27/21 04/27/21              Exercise Comments:   Exercise Goals and Review:   Exercise Goals     Row Name 03/24/21 1147             Exercise Goals   Increase Physical  Activity Yes       Intervention Provide advice, education, support and counseling about physical activity/exercise needs.;Develop an individualized exercise prescription for aerobic and  resistive training based on initial evaluation findings, risk stratification, comorbidities and participant's personal goals.       Expected Outcomes Short Term: Attend rehab on a regular basis to increase amount of physical activity.;Long Term: Add in home exercise to make exercise part of routine and to increase amount of physical activity.;Long Term: Exercising regularly at least 3-5 days a week.       Increase Strength and Stamina Yes       Intervention Provide advice, education, support and counseling about physical activity/exercise needs.;Develop an individualized exercise prescription for aerobic and resistive training based on initial evaluation findings, risk stratification, comorbidities and participant's personal goals.       Expected Outcomes Short Term: Increase workloads from initial exercise prescription for resistance, speed, and METs.;Short Term: Perform resistance training exercises routinely during rehab and add in resistance training at home;Long Term: Improve cardiorespiratory fitness, muscular endurance and strength as measured by increased METs and functional capacity (6MWT)       Able to understand and use rate of perceived exertion (RPE) scale Yes       Intervention Provide education and explanation on how to use RPE scale       Expected Outcomes Short Term: Able to use RPE daily in rehab to express subjective intensity level;Long Term:  Able to use RPE to guide intensity level when exercising independently       Able to understand and use Dyspnea scale Yes       Intervention Provide education and explanation on how to use Dyspnea scale       Expected Outcomes Short Term: Able to use Dyspnea scale daily in rehab to express subjective sense of shortness of breath during exertion;Long Term: Able to  use Dyspnea scale to guide intensity level when exercising independently       Knowledge and understanding of Target Heart Rate Range (THRR) Yes       Intervention Provide education and explanation of THRR including how the numbers were predicted and where they are located for reference       Expected Outcomes Short Term: Able to state/look up THRR;Long Term: Able to use THRR to govern intensity when exercising independently;Short Term: Able to use daily as guideline for intensity in rehab       Able to check pulse independently Yes       Intervention Provide education and demonstration on how to check pulse in carotid and radial arteries.;Review the importance of being able to check your own pulse for safety during independent exercise       Expected Outcomes Short Term: Able to explain why pulse checking is important during independent exercise;Long Term: Able to check pulse independently and accurately       Understanding of Exercise Prescription Yes       Intervention Provide education, explanation, and written materials on patient's individual exercise prescription       Expected Outcomes Short Term: Able to explain program exercise prescription;Long Term: Able to explain home exercise prescription to exercise independently                Exercise Goals Re-Evaluation :  Exercise Goals Re-Evaluation     Row Name 03/30/21 1745 04/14/21 1305 04/27/21 1803 05/14/21 0847 05/18/21 1730     Exercise Goal Re-Evaluation   Exercise Goals Review Increase Physical Activity;Able to understand and use rate of perceived exertion (RPE) scale;Knowledge and understanding of Target Heart Rate Range (THRR);Understanding of Exercise Prescription;Increase Strength and Stamina;Able to check pulse  independently Increase Physical Activity;Increase Strength and Stamina Increase Physical Activity;Increase Strength and Stamina Increase Physical Activity;Increase Strength and Stamina Increase Physical  Activity;Increase Strength and Stamina;Understanding of Exercise Prescription   Comments Reviewed RPE and dyspnea scales, THR and program prescription with pt today.  Pt voiced understanding and was given a copy of goals to take home. Iris can do 30 min on NS.  Staff will encourage trying to do some laps on track and increasing level on NS. Reviewed home exercise with pt today.  Pt plans to walk and complete Youtube staff videos for exercise.  Reviewed THR, pulse, RPE, sign and symptoms, pulse oximetery and when to call 911 or MD.  Also discussed weather considerations and indoor options.  Pt voiced understanding. Jabrill is doing well in rehab. He has physical limitations that do not allow him to walk but has increased his level on the T4 to level 4. Will continue to increase load and monitor progress. Khari reports that he feels like he is getting stronger with his exercise and has progressed on his T4 NS level since starting the program. While he is limitied with walking he does try to do some walking with breaks as needed at home and is doing strength and stretching exercises at home on off days of rehab, following the home exercise guidelines provided by staff.   Expected Outcomes Short: Use RPE daily to regulate intensity. Long: Follow program prescription in THR. Short: try track/increase level on NS Long: improve overall stamina Short: Add on 1 day of exercise at home and monitor HR Long: Continue to exercise at home independently at appropriate exercise prescription Short: Continue building up tolerance on T4 Long: Continue to increase overall strength and stamina Short: continue to exercise at home and progress with intensity levels on T4 in cardiac rehab class. Long: become independent with exercise program and continue with exercise at home once he is discharged from program.    [place] Name 05/27/21 1507             Exercise Goal Re-Evaluation   Exercise Goals Review Increase Physical  Activity;Increase Strength and Stamina;Understanding of Exercise Prescription       Comments Derren is doing well in rehab.  He is on level 3 for 30 min on the NuStep.  We will continue to encourage him to try walking more. We will continue to montior his progress.       Expected Outcomes Short: Walk some in rehab.  Long: Continue to improve stamina                Discharge Exercise Prescription (Final Exercise Prescription Changes):  Exercise Prescription Changes - 05/27/21 1500       Response to Exercise   Blood Pressure (Admit) 136/72    Blood Pressure (Exit) 102/58    Heart Rate (Admit) 68 bpm    Heart Rate (Exercise) 78 bpm    Heart Rate (Exit) 58 bpm    Rating of Perceived Exertion (Exercise) 13    Symptoms none    Duration Continue with 30 min of aerobic exercise without signs/symptoms of physical distress.    Intensity THRR unchanged      Progression   Progression Continue to progress workloads to maintain intensity without signs/symptoms of physical distress.    Average METs 2.8      Resistance Training   Training Prescription Yes    Weight 4 lb    Reps 10-15      Interval Training   Interval  Training No      NuStep   Level 3    Minutes 30    METs 2.8      Home Exercise Plan   Plans to continue exercise at Home (comment)   walking, Youtube videos   Frequency Add 2 additional days to program exercise sessions.   Start with 1 day   Initial Home Exercises Provided 04/27/21             Nutrition:  Target Goals: Understanding of nutrition guidelines, daily intake of sodium '1500mg'$ , cholesterol '200mg'$ , calories 30% from fat and 7% or less from saturated fats, daily to have 5 or more servings of fruits and vegetables.  Education: All About Nutrition: -Group instruction provided by verbal, written material, interactive activities, discussions, models, and posters to present general guidelines for heart healthy nutrition including fat, fiber, MyPlate, the role  of sodium in heart healthy nutrition, utilization of the nutrition label, and utilization of this knowledge for meal planning. Follow up email sent as well. Written material given at graduation. Flowsheet Row Cardiac Rehab from 04/15/2021 in [facility] Cardiac and Pulmonary Rehab  Education need identified 03/24/21       Biometrics:  Pre Biometrics - 03/24/21 1244       Pre Biometrics   Height 5' 8.6" (1.742 m)    Weight 228 lb 1.6 oz (103.5 kg)    BMI (Calculated) 34.1    Single Leg Stand 1.2 seconds              Nutrition Therapy Plan and Nutrition Goals:  Nutrition Therapy & Goals - 05/25/21 1721       Nutrition Therapy   Diet Heart healthy, low Na    Drug/Food Interactions Statins/Certain Fruits    Protein (specify units) 80g    Fiber 30 grams    Whole Grain Foods 3 servings    Saturated Fats 12 max. grams    Fruits and Vegetables 8 servings/day    Sodium 1.5 grams      Personal Nutrition Goals   Nutrition Goal ST: add fruit to breakfast - he is thinking a banana, add veggies to lunch LT: maintain heart healthy changes    Comments Uses olive oil. B: oatmeal with pecans L: peanut butter sandwich on whole wheat D: wife cooks dinner. She will cook greens for dinner, skinless chicken leg or [place] wings. She uses some sea salt.  Drinks: water. Discussed heart healthy eating, Joven seems to have a balanced diet (he doesn't eat many carbohydrates for dinner - he coiuld include some starchy vegetables or whole grains or beans), he enjoys what he is eating and he is satisfied during the day. Jansel reports his weight has been stable.      Intervention Plan   Intervention Prescribe, educate and counsel regarding individualized specific dietary modifications aiming towards targeted core components such as weight, hypertension, lipid management, diabetes, heart failure and other comorbidities.;Nutrition handout(s) given to patient.    Expected Outcomes Short Term Goal: Understand basic  principles of dietary content, such as calories, fat, sodium, cholesterol and nutrients.;Short Term Goal: A plan has been developed with personal nutrition goals set during dietitian appointment.;Long Term Goal: Adherence to prescribed nutrition plan.             Nutrition Assessments:  MEDIFICTS Score Key: ?70 Need to make dietary changes  40-70 Heart Healthy Diet ? 40 Therapeutic Level Cholesterol Diet  Flowsheet Row Cardiac Rehab from 03/24/2021 in [facility] Cardiac and Pulmonary Rehab  Picture  Your Plate Total Score on Admission 80      Picture Your Plate Scores: [id number] Unhealthy dietary pattern with much room for improvement. 41-50 Dietary pattern unlikely to meet recommendations for good health and room for improvement. 51-60 More healthful dietary pattern, with some room for improvement.  >60 Healthy dietary pattern, although there may be some specific behaviors that could be improved.    Nutrition Goals Re-Evaluation:  Nutrition Goals Re-Evaluation     [place] Name 04/16/21 1742 05/18/21 1739           Goals   Current Weight 228 lb (103.4 kg) --      Nutrition Goal Possible snacking or eating a little more. --      Comment Ahmar states that he is meeting with the dietician on 04/27/2021. He is not sure if he is eating enough food. He wants to lose some weight. He feels like he has some more energy since he started but would like to have more. Informed him to make a 3 day log of what he is eating to get an idea of his nutrition habits. Demarr has missed several appointments with program dietician. He has been encouraged to reschedule his appointment in order to set specific nutrition goals.      Expected Outcome Short: meet with the dietician. Long: maintain a healthy diet that pertains to his needs. Short: meet with the dietician. Long: maintain a healthy diet that pertains to his needs.               Nutrition Goals Discharge (Final Nutrition Goals Re-Evaluation):  Nutrition  Goals Re-Evaluation - 05/18/21 1739       Goals   Comment Hulett has missed several appointments with program dietician. He has been encouraged to reschedule his appointment in order to set specific nutrition goals.    Expected Outcome Short: meet with the dietician. Long: maintain a healthy diet that pertains to his needs.             Psychosocial: Target Goals: Acknowledge presence or absence of significant depression and/or stress, maximize coping skills, provide positive support system. Participant is able to verbalize types and ability to use techniques and skills needed for reducing stress and depression.   Education: Stress, Anxiety, and Depression - Group verbal and visual presentation to define topics covered.  Reviews how body is impacted by stress, anxiety, and depression.  Also discusses healthy ways to reduce stress and to treat/manage anxiety and depression.  Written material given at graduation. Flowsheet Row Cardiac Rehab from 04/15/2021 in [facility] Cardiac and Pulmonary Rehab  Date 04/15/21  Educator AS  Instruction Review Code 1- Verbalizes Understanding       Education: Sleep Hygiene -Provides group verbal and written instruction about how sleep can affect your health.  Define sleep hygiene, discuss sleep cycles and impact of sleep habits. Review good sleep hygiene tips.    Initial Review & Psychosocial Screening:  Initial Psych Review & Screening - 03/18/21 1039       Initial Review   Current issues with None Identified      Family Dynamics   Good Support System? Yes   family     Barriers   Psychosocial barriers to participate in program There are no identifiable barriers or psychosocial needs.;The patient should benefit from training in stress management and relaxation.      Screening Interventions   Interventions Encouraged to exercise;Provide feedback about the scores to participant;To provide support and resources with identified psychosocial  needs     Expected Outcomes Short Term goal: Utilizing psychosocial counselor, staff and physician to assist with identification of specific Stressors or current issues interfering with healing process. Setting desired goal for each stressor or current issue identified.;Long Term Goal: Stressors or current issues are controlled or eliminated.;Short Term goal: Identification and review with participant of any Quality of Life or Depression concerns found by scoring the questionnaire.;Long Term goal: The participant improves quality of Life and PHQ9 Scores as seen by post scores and/or verbalization of changes             Quality of Life Scores:   Quality of Life - 03/24/21 1245       Quality of Life   Select Quality of Life      Quality of Life Scores   Health/Function Pre 26.26 %    Socioeconomic Pre 29.5 %    Psych/Spiritual Pre 28.8 %    Family Pre 27.6 %    GLOBAL Pre 27.78 %            Scores of 19 and below usually indicate a poorer quality of life in these areas.  A difference of  2-3 points is a clinically meaningful difference.  A difference of 2-3 points in the total score of the Quality of Life Index has been associated with significant improvement in overall quality of life, self-image, physical symptoms, and general health in studies assessing change in quality of life.  PHQ-9: Recent Review Flowsheet Data     Depression screen [facility] 2/9 03/24/2021   Decreased Interest 0   Down, Depressed, Hopeless 0   PHQ - 2 Score 0   Altered sleeping 0   Tired, decreased energy 0   Change in appetite 0   Feeling bad or failure about yourself  0   Trouble concentrating 0   Moving slowly or fidgety/restless 0   Suicidal thoughts 0   PHQ-9 Score 0   Difficult doing work/chores Not difficult at all      Interpretation of Total Score  Total Score Depression Severity:  1-4 = Minimal depression, 5-9 = Mild depression, 10-14 = Moderate depression, 15-19 = Moderately severe depression, 20-27  = Severe depression   Psychosocial Evaluation and Intervention:  Psychosocial Evaluation - 03/18/21 1053       Psychosocial Evaluation & Interventions   Interventions Encouraged to exercise with the program and follow exercise prescription    Comments Mr. 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 He said he is eating and sleeping well. He is still doing what he wants to do during the day. He does have a copay which will most likely hinder the length of his attendance. While he is in the program he wants to work on his diabetes and maintaining his independence.    Expected Outcomes Short: attend cardiac rehab for education and exercise. Long: develop positive self care habits.    Continue Psychosocial Services  Follow up required by staff             Psychosocial Re-Evaluation:  Psychosocial Re-Evaluation     [place] Name 04/16/21 1745 05/18/21 1729           Psychosocial Re-Evaluation   Current issues with None Identified None Identified      Comments Patient reports no issues with their current mental states, sleep, stress, depression or anxiety. Will follow up with patient in a few weeks for any changes. Leston has 5 kids and 18  grandkids. He sees his grandkids often. Patient reports no issues with their current mental states, sleep, stress, depression or anxiety. Will follow up with patient in a few weeks for any changes. Patient reports that he has a strong family support system.      Expected Outcomes Short: Continue to exercise regularly to support mental health and notify staff of any changes. Long: maintain mental health and well being through teaching of rehab or prescribed medications independently. Short: Continue to exercise regularly to support mental health and notify staff of any changes. Long: maintain mental health and well being through teaching of rehab or prescribed medications independently.      Interventions Encouraged to attend Cardiac  Rehabilitation for the exercise Encouraged to attend Cardiac Rehabilitation for the exercise      Continue Psychosocial Services  Follow up required by staff Follow up required by staff               Psychosocial Discharge (Final Psychosocial Re-Evaluation):  Psychosocial Re-Evaluation - 05/18/21 1729       Psychosocial Re-Evaluation   Current issues with None Identified    Comments Patient reports no issues with their current mental states, sleep, stress, depression or anxiety. Will follow up with patient in a few weeks for any changes. Patient reports that he has a strong family support system.    Expected Outcomes Short: Continue to exercise regularly to support mental health and notify staff of any changes. Long: maintain mental health and well being through teaching of rehab or prescribed medications independently.    Interventions Encouraged to attend Cardiac Rehabilitation for the exercise    Continue Psychosocial Services  Follow up required by staff             Vocational Rehabilitation: Provide vocational rehab assistance to qualifying candidates.   Vocational Rehab Evaluation & Intervention:  Vocational Rehab - 03/18/21 1053       Initial Vocational Rehab Evaluation & Intervention   Assessment shows need for Vocational Rehabilitation No             Education: Education Goals: Education classes will be provided on a variety of topics geared toward better understanding of heart health and risk factor modification. Participant will state understanding/return demonstration of topics presented as noted by education test scores.  Learning Barriers/Preferences:  Learning Barriers/Preferences - 03/18/21 1040       Learning Barriers/Preferences   Learning Barriers None    Learning Preferences None             General Cardiac Education Topics:  AED/CPR: - Group verbal and written instruction with the use of models to demonstrate the basic use of the AED  with the basic ABC's of resuscitation.   Anatomy and Cardiac Procedures: - Group verbal and visual presentation and models provide information about basic cardiac anatomy and function. Reviews the testing methods done to diagnose heart disease and the outcomes of the test results. Describes the treatment choices: Medical Management, Angioplasty, or Coronary Bypass Surgery for treating various heart conditions including Myocardial Infarction, Angina, Valve Disease, and Cardiac Arrhythmias.  Written material given at graduation. Flowsheet Row Cardiac Rehab from 04/15/2021 in [facility] Cardiac and Pulmonary Rehab  Education need identified 03/24/21       Medication Safety: - Group verbal and visual instruction to review commonly prescribed medications for heart and lung disease. Reviews the medication, class of the drug, and side effects. Includes the steps to properly store meds and maintain the prescription regimen.  Written material given at graduation.   Intimacy: - Group verbal instruction through game format to discuss how heart and lung disease can affect sexual intimacy. Written material given at graduation..   Know Your Numbers and Heart Failure: - Group verbal and visual instruction to discuss disease risk factors for cardiac and pulmonary disease and treatment options.  Reviews associated critical values for Overweight/Obesity, Hypertension, Cholesterol, and Diabetes.  Discusses basics of heart failure: signs/symptoms and treatments.  Introduces Heart Failure Zone chart for action plan for heart failure.  Written material given at graduation. Flowsheet Row Cardiac Rehab from 04/15/2021 in [facility] Cardiac and Pulmonary Rehab  Date 04/01/21  Educator [facility]  Instruction Review Code 1- Verbalizes Understanding       Infection Prevention: - Provides verbal and written material to individual with discussion of infection control including proper hand washing and proper equipment cleaning during  exercise session. Flowsheet Row Cardiac Rehab from 04/15/2021 in [facility] Cardiac and Pulmonary Rehab  Date 03/24/21  Educator [facility]  Instruction Review Code 1- Verbalizes Understanding       Falls Prevention: - Provides verbal and written material to individual with discussion of falls prevention and safety. Flowsheet Row Cardiac Rehab from 04/15/2021 in [facility] Cardiac and Pulmonary Rehab  Date 03/24/21  Educator [facility]  Instruction Review Code 1- Verbalizes Understanding       Other: -Provides group and verbal instruction on various topics (see comments)   Knowledge Questionnaire Score:  Knowledge Questionnaire Score - 03/24/21 1246       Knowledge Questionnaire Score   Pre Score 19/26 Education Focus: nutrition, exercise, angina, cessation             Core Components/Risk Factors/Patient Goals at Admission:  Personal Goals and Risk Factors at Admission - 03/24/21 1246       Core Components/Risk Factors/Patient Goals on Admission    Weight Management Yes;Obesity;Weight Loss    Intervention Weight Management: Develop a combined nutrition and exercise program designed to reach desired caloric intake, while maintaining appropriate intake of nutrient and fiber, sodium and fats, and appropriate energy expenditure required for the weight goal.;Weight Management: Provide education and appropriate resources to help participant work on and attain dietary goals.;Weight Management/Obesity: Establish reasonable short term and long term weight goals.;Obesity: Provide education and appropriate resources to help participant work on and attain dietary goals.    Admit Weight 228 lb 1.6 oz (103.5 kg)    Goal Weight: Short Term 223 lb (101.2 kg)    Goal Weight: Long Term 220 lb (99.8 kg)    Expected Outcomes Short Term: Continue to assess and modify interventions until short term weight is achieved;Long Term: Adherence to nutrition and physical activity/exercise program aimed toward attainment of  established weight goal;Weight Loss: Understanding of general recommendations for a balanced deficit meal plan, which promotes 1-2 lb weight loss per week and includes a negative energy balance of [phone] kcal/d;Understanding recommendations for meals to include 15-35% energy as protein, 25-35% energy from fat, 35-60% energy from carbohydrates, less than '200mg'$  of dietary cholesterol, 20-35 gm of total fiber daily;Understanding of distribution of calorie intake throughout the day with the consumption of 4-5 meals/snacks    Diabetes Yes    Intervention Provide education about signs/symptoms and action to take for hypo/hyperglycemia.;Provide education about proper nutrition, including hydration, and aerobic/resistive exercise prescription along with prescribed medications to achieve blood glucose in normal ranges: Fasting glucose 65-99 mg/dL    Expected Outcomes Short Term: Participant verbalizes understanding of the signs/symptoms and immediate  care of hyper/hypoglycemia, proper foot care and importance of medication, aerobic/resistive exercise and nutrition plan for blood glucose control.;Long Term: Attainment of HbA1C < 7%.    Hypertension Yes    Intervention Provide education on lifestyle modifcations including regular physical activity/exercise, weight management, moderate sodium restriction and increased consumption of fresh fruit, vegetables, and low fat dairy, alcohol moderation, and smoking cessation.;Monitor prescription use compliance.    Expected Outcomes Short Term: Continued assessment and intervention until BP is < 140/37m HG in hypertensive participants. < 130/868mHG in hypertensive participants with diabetes, heart failure or chronic kidney disease.;Long Term: Maintenance of blood pressure at goal levels.    Lipids Yes    Intervention Provide education and support for participant on nutrition & aerobic/resistive exercise along with prescribed medications to achieve LDL '70mg'$ , HDL >'40mg'$ .     Expected Outcomes Short Term: Participant states understanding of desired cholesterol values and is compliant with medications prescribed. Participant is following exercise prescription and nutrition guidelines.;Long Term: Cholesterol controlled with medications as prescribed, with individualized exercise RX and with personalized nutrition plan. Value goals: LDL < '70mg'$ , HDL > 40 mg.             Education:Diabetes - Individual verbal and written instruction to review signs/symptoms of diabetes, desired ranges of glucose level fasting, after meals and with exercise. Acknowledge that pre and post exercise glucose checks will be done for 3 sessions at entry of program. [facility]  Date 03/18/21  [facility] Review Code 1- Verbalizes Understanding       Core Components/Risk Factors/Patient Goals Review:   Goals and Risk Factor Review     Row Name 04/16/21 1739 05/18/21 1734           Core Components/Risk Factors/Patient Goals Review   Personal Goals Review Weight Management/Obesity;Diabetes Weight Management/Obesity;Diabetes      Review [place] that his blood sugars have been good at home between 120 and 130 fasting. He is still trying to lose a couple pounds. He wants to reach a weight goal of 200 pounds. Informed patient that he needs to figure out if what he is eating is going to help him lose weight. [place] that he continues to check his blood sugars at home and they remain withing acceptable ranges. He stated that he has not seen too much of a change in in his weight since starting the program. He has been consistent with exercise but was encouraged to reschedule his dietician appointment that was missed in order to work on nutrician goals.      Expected Outcomes Short: lose some weight. Long: lose 10 pounds in the program. Short: meet with dietican to to discuss nutition aspect with reguards to  weight loss. Long: lose 10 pounsd in the program.               Core Components/Risk Factors/Patient Goals at Discharge (Final Review):   Goals and Risk Factor Review - 05/18/21 1734       Core Components/Risk Factors/Patient Goals Review   Personal Goals Review Weight Management/Obesity;Diabetes    Review CoLongeports that he continues to check his blood sugars at home and they remain withing acceptable ranges. He stated that he has not seen too much of a change in in his weight since starting the program. He has been consistent with exercise but was encouraged to reschedule his dietician appointment that was missed in order to work on  nutrician goals.    Expected Outcomes Short: meet with dietican to to discuss nutition aspect with reguards to weight loss. Long: lose 10 pounsd in the program.             ITP Comments:  ITP Comments     Row Name 03/18/21 1059 03/24/21 1123 03/25/21 0719 03/30/21 1745 04/22/21 0747   ITP Comments Initial telephone orientation completed. Diagnosis can be found in [facility] 4/22. EP orientation scheduled for Tuesday 5/17 at 10am. Completed 6MWT and gym orientation. Initial ITP created and sent for review to Dr. Emily Filbert, Medical Director. 30 Day review completed. Medical Director ITP review done, changes made as directed, and signed approval by Medical Director.   New to program First full day of exercise!  Patient was oriented to gym and equipment including functions, settings, policies, and procedures.  Patient's individual exercise prescription and treatment plan were reviewed.  All starting workloads were established based on the results of the 6 minute walk test done at initial orientation visit.  The plan for exercise progression was also introduced and progression will be customized based on patient's performance and goals 30 Day review completed. Medical Director ITP review done, changes made as directed, and signed approval by Medical Director.     [place] Name 05/20/21 0840 06/10/21 1143 06/17/21 0727       ITP Comments 30 Day review completed. Medical Director ITP review done, changes made as directed, and signed approval by Medical Director. Saviyon has not attended since last review 30 Day review completed. Medical Director ITP review done, changes made as directed, and signed approval by Medical Director.              Comments:

## 2021-06-17 NOTE — Telephone Encounter (Signed)
Attempted to call patient regarding attendance with Cardiac Rehab. Left message asking for callback.

## 2021-06-18 ENCOUNTER — Other Ambulatory Visit: Payer: Self-pay | Admitting: Surgical

## 2021-06-21 ENCOUNTER — Other Ambulatory Visit: Payer: Self-pay | Admitting: Surgical

## 2021-06-24 ENCOUNTER — Telehealth: Payer: Self-pay

## 2021-06-24 ENCOUNTER — Other Ambulatory Visit: Payer: Self-pay | Admitting: Surgical

## 2021-06-24 NOTE — Telephone Encounter (Signed)
Spoke with patient who stated he feels better and plans to be back at rehab on 8/22. He confirmed his COVID tests were negative and is aware he needs to be symptoms free before he comes back. Patient understood and will call us if anything changes.

## 2021-06-29 ENCOUNTER — Other Ambulatory Visit: Payer: Self-pay

## 2021-06-29 ENCOUNTER — Encounter: Payer: Medicare HMO | Admitting: *Deleted

## 2021-06-29 DIAGNOSIS — I214 Non-ST elevation (NSTEMI) myocardial infarction: Secondary | ICD-10-CM | POA: Diagnosis present

## 2021-06-29 DIAGNOSIS — Z87891 Personal history of nicotine dependence: Secondary | ICD-10-CM | POA: Diagnosis not present

## 2021-06-29 DIAGNOSIS — Z951 Presence of aortocoronary bypass graft: Secondary | ICD-10-CM

## 2021-06-29 NOTE — Progress Notes (Signed)
Daily Session Note  Patient Details  Name: [name] MRN: [id number] Date of Birth: 1935-04-14 Referring Provider:   Flowsheet Row Cardiac Rehab from 03/24/2021 in [facility] Cardiac and Pulmonary Rehab  Referring Provider [name] MD       Encounter Date: 06/29/2021  Check In:  Session Check In - 06/29/21 1712       Check-In   Supervising physician immediately available to respond to emergencies See telemetry face sheet for immediately available ER MD    Location [facility]-Cardiac & Pulmonary Rehab    Staff Present Renita Papa, RN BSN;Joseph [place], RCP,RRT,BSRT;Kara [place], [place], ASCM CEP, Exercise Physiologist    Virtual Visit No    Medication changes reported     No    Fall or balance concerns reported    No    Warm-up and Cool-down Performed on first and last piece of equipment    Resistance Training Performed Yes    VAD Patient? No    PAD/SET Patient? No      Pain Assessment   Currently in Pain? No/denies                Social History   Tobacco Use  Smoking Status Former   Types: Cigarettes   Quit date: 07/26/1996   Years since quitting: 24.9  Smokeless Tobacco Never    Goals Met:  Independence with exercise equipment Exercise tolerated well No report of cardiac concerns or symptoms Strength training completed today  Goals Unmet:  Not Applicable  Comments: Pt able to follow exercise prescription today without complaint.  Will continue to monitor for progression.    Dr. Emily Filbert is Medical Director for [place].  Dr. Ottie Glazier is Medical Director for [facility] Pulmonary Rehabilitation.

## 2021-07-01 ENCOUNTER — Other Ambulatory Visit: Payer: Self-pay

## 2021-07-01 DIAGNOSIS — Z951 Presence of aortocoronary bypass graft: Secondary | ICD-10-CM

## 2021-07-01 DIAGNOSIS — I214 Non-ST elevation (NSTEMI) myocardial infarction: Secondary | ICD-10-CM

## 2021-07-01 NOTE — Progress Notes (Signed)
Daily Session Note  Patient Details  Name: [name] MRN: [id number] Date of Birth: 1935-03-23 Referring Provider:   Flowsheet Row Cardiac Rehab from 03/24/2021 in [facility] Cardiac and Pulmonary Rehab  Referring Provider [name] MD       Encounter Date: 07/01/2021  Check In:  Session Check In - 07/01/21 1723       Check-In   Supervising physician immediately available to respond to emergencies See telemetry face sheet for immediately available ER MD    Location [facility]-Cardiac & Pulmonary Rehab    Staff Present Birdie Sons, MPA, Nino Glow, MS, ASCM CEP, Exercise Physiologist;Joseph Tessie Fass, [place]    Virtual Visit No    Medication changes reported     No    Fall or balance concerns reported    No    Warm-up and Cool-down Performed on first and last piece of equipment    Resistance Training Performed Yes    VAD Patient? No    PAD/SET Patient? No      Pain Assessment   Currently in Pain? No/denies                Social History   Tobacco Use  Smoking Status Former   Types: Cigarettes   Quit date: 07/26/1996   Years since quitting: 24.9  Smokeless Tobacco Never    Goals Met:  Independence with exercise equipment Exercise tolerated well No report of cardiac concerns or symptoms Strength training completed today  Goals Unmet:  Not Applicable  Comments: Pt able to follow exercise prescription today without complaint.  Will continue to monitor for progression.    Dr. Emily Filbert is Medical Director for [place].  Dr. Ottie Glazier is Medical Director for [facility] Pulmonary Rehabilitation.

## 2021-07-02 ENCOUNTER — Ambulatory Visit (INDEPENDENT_AMBULATORY_CARE_PROVIDER_SITE_OTHER): Payer: Medicare HMO | Admitting: Nurse Practitioner

## 2021-07-02 ENCOUNTER — Encounter: Payer: Medicare HMO | Admitting: *Deleted

## 2021-07-02 ENCOUNTER — Encounter: Payer: Self-pay | Admitting: Nurse Practitioner

## 2021-07-02 VITALS — BP 130/62 | HR 49 | Ht 68.0 in | Wt 222.0 lb

## 2021-07-02 DIAGNOSIS — I214 Non-ST elevation (NSTEMI) myocardial infarction: Secondary | ICD-10-CM | POA: Diagnosis not present

## 2021-07-02 DIAGNOSIS — I2510 Atherosclerotic heart disease of native coronary artery without angina pectoris: Secondary | ICD-10-CM | POA: Diagnosis not present

## 2021-07-02 DIAGNOSIS — I10 Essential (primary) hypertension: Secondary | ICD-10-CM | POA: Diagnosis not present

## 2021-07-02 DIAGNOSIS — E875 Hyperkalemia: Secondary | ICD-10-CM

## 2021-07-02 DIAGNOSIS — N1831 Chronic kidney disease, stage 3a: Secondary | ICD-10-CM | POA: Diagnosis not present

## 2021-07-02 DIAGNOSIS — I5042 Chronic combined systolic (congestive) and diastolic (congestive) heart failure: Secondary | ICD-10-CM | POA: Diagnosis not present

## 2021-07-02 DIAGNOSIS — Z951 Presence of aortocoronary bypass graft: Secondary | ICD-10-CM
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
## 2021-07-02 NOTE — Patient Instructions (Addendum)
Medication Instructions:  - Your physician has recommended you make the following change in your medication:   1) STOP spironolactone  2) STOP potassium  *If you need a refill on your cardiac medications before your next appointment, please call your pharmacy*   Lab Work: - Your physician recommends that you have lab work today: BMP  If you have labs (blood work) drawn today and your tests are completely normal, you will receive your results only by: [place] (if you have MyChart) OR A paper copy in the mail If you have any lab test that is abnormal or we need to change your treatment, we will call you to review the results.   Testing/Procedures: - none ordered   Follow-Up: At [facility], you and your health needs are our priority.  As part of our continuing mission to provide you with exceptional heart care, we have created designated Provider Care Teams.  These Care Teams include your primary Cardiologist (physician) and Advanced Practice Providers (APPs -  Physician Assistants and Nurse Practitioners) who all work together to provide you with the care you need, when you need it.  We recommend signing up for the patient portal called "MyChart".  Sign up information is provided on this After Visit Summary.  MyChart is used to connect with patients for Virtual Visits (Telemedicine).  Patients are able to view lab/test results, encounter notes, upcoming appointments, etc.  Non-urgent messages can be sent to your provider as well.   To learn more about what you can do with MyChart, go to [email or web address].    Your next appointment:   1 month(s)  The format for your next appointment:   In Person  Provider:   You may see [name], MD or one of the following Advanced Practice Providers on your designated Care Team:   Murray Hodgkins, NP    Other Instructions N/a

## 2021-07-02 NOTE — Progress Notes (Signed)
Daily Session Note  Patient Details  Name: [name] MRN: [id number] Date of Birth: 09-24-1935 Referring Provider:   Flowsheet Row Cardiac Rehab from 03/24/2021 in [facility] Cardiac and Pulmonary Rehab  Referring Provider [name] MD       Encounter Date: 07/02/2021  Check In:  Session Check In - 07/02/21 1725       Check-In   Supervising physician immediately available to respond to emergencies See telemetry face sheet for immediately available ER MD    Location [facility]-Cardiac & Pulmonary Rehab    Staff Present Renita Papa, RN BSN;Joseph [place], RCP,RRT,BSRT;Kara [place], [place], ASCM CEP, Exercise Physiologist    Virtual Visit No    Medication changes reported     No    Fall or balance concerns reported    No    Warm-up and Cool-down Performed on first and last piece of equipment    Resistance Training Performed Yes    VAD Patient? No    PAD/SET Patient? No      Pain Assessment   Currently in Pain? No/denies                Social History   Tobacco Use  Smoking Status Former   Types: Cigarettes   Quit date: 07/26/1996   Years since quitting: 24.9  Smokeless Tobacco Never    Goals Met:  Independence with exercise equipment Exercise tolerated well No report of concerns or symptoms today Strength training completed today  Goals Unmet:  Not Applicable  Comments: Pt able to follow exercise prescription today without complaint.  Will continue to monitor for progression.    Dr. Emily Filbert is Medical Director for [place].  Dr. Ottie Glazier is Medical Director for [facility] Pulmonary Rehabilitation.

## 2021-07-03 LAB — BASIC METABOLIC PANEL
BUN/Creatinine Ratio: 16 (ref 10–24)
BUN: 34 mg/dL — ABNORMAL HIGH (ref 8–27)
CO2: 19 mmol/L — ABNORMAL LOW (ref 20–29)
Calcium: 9.3 mg/dL (ref 8.6–10.2)
Chloride: 104 mmol/L (ref 96–106)
Creatinine, Ser: 2.19 mg/dL — ABNORMAL HIGH (ref 0.76–1.27)
Glucose: 203 mg/dL — ABNORMAL HIGH (ref 65–99)
Potassium: 5.2 mmol/L (ref 3.5–5.2)
Sodium: 138 mmol/L (ref 134–144)
eGFR: 29 mL/min/{1.73_m2} — ABNORMAL LOW (ref >59)

## 2021-07-06 ENCOUNTER — Other Ambulatory Visit: Payer: Self-pay

## 2021-07-06 ENCOUNTER — Encounter: Payer: Medicare HMO | Admitting: *Deleted

## 2021-07-06 ENCOUNTER — Telehealth: Payer: Self-pay | Admitting: *Deleted

## 2021-07-06 DIAGNOSIS — I214 Non-ST elevation (NSTEMI) myocardial infarction: Secondary | ICD-10-CM

## 2021-07-06 DIAGNOSIS — Z951 Presence of aortocoronary bypass graft: Secondary | ICD-10-CM

## 2021-07-06 NOTE — Telephone Encounter (Signed)
-----   Message from Theora Gianotti, NP sent at 07/03/2021  8:34 AM EDT ----- We stopped spironolactone and potassium supplement yesterday.  His potassium was on the high side of normal on this check.  Creatinine is better than when seen @ [facility], just above baseline.  Plan to f/u bmet in 1 week to reassess K and Creat off of spiro and supplemental potassium.

## 2021-07-06 NOTE — Telephone Encounter (Signed)
Attempted to call pt. No answer. Lmtcb.  

## 2021-07-06 NOTE — Progress Notes (Signed)
Daily Session Note  Patient Details  Name: [name] MRN: [id number] Date of Birth: 11-Mar-1935 Referring Provider:   Flowsheet Row Cardiac Rehab from 03/24/2021 in [facility] Cardiac and Pulmonary Rehab  Referring Provider [name] MD       Encounter Date: 07/06/2021  Check In:  Session Check In - 07/06/21 1717       Check-In   Supervising physician immediately available to respond to emergencies See telemetry face sheet for immediately available ER MD    Location [facility]-Cardiac & Pulmonary Rehab    Staff Present Renita Papa, RN BSN;Joseph [place], RCP,RRT,BSRT;Kara [place], [place], ASCM CEP, Exercise Physiologist    Virtual Visit No    Medication changes reported     No    Fall or balance concerns reported    No    Warm-up and Cool-down Performed on first and last piece of equipment    Resistance Training Performed Yes    VAD Patient? No    PAD/SET Patient? No      Pain Assessment   Currently in Pain? No/denies                Social History   Tobacco Use  Smoking Status Former   Types: Cigarettes   Quit date: 07/26/1996   Years since quitting: 24.9  Smokeless Tobacco Never    Goals Met:  Independence with exercise equipment Exercise tolerated well No report of concerns or symptoms today Strength training completed today  Goals Unmet:  Not Applicable  Comments: Pt able to follow exercise prescription today without complaint.  Will continue to monitor for progression.    Dr. Emily Filbert is Medical Director for [place].  Dr. Ottie Glazier is Medical Director for [facility] Pulmonary Rehabilitation.

## 2021-07-09 ENCOUNTER — Other Ambulatory Visit: Payer: Self-pay

## 2021-07-09 ENCOUNTER — Encounter: Payer: Medicare HMO | Attending: Cardiovascular Disease | Admitting: *Deleted

## 2021-07-09 ENCOUNTER — Telehealth: Payer: Self-pay | Admitting: *Deleted

## 2021-07-09 DIAGNOSIS — Z951 Presence of aortocoronary bypass graft: Secondary | ICD-10-CM | POA: Diagnosis present

## 2021-07-09 DIAGNOSIS — I214 Non-ST elevation (NSTEMI) myocardial infarction: Secondary | ICD-10-CM

## 2021-07-09 NOTE — Telephone Encounter (Signed)
Left voicemail message to call back for review of results.    ----- Message from Theora Gianotti, NP sent at 07/09/2021  1:34 PM EDT -----  Thank you. Potassium is normal.  Kidney function sl improved from last check.

## 2021-07-09 NOTE — Telephone Encounter (Signed)
Spoke with patient and he states he had repeat labs yesterday over at [facility]. Advised I would forward this information over to the provider and then call him back with any recommendations. He verbalized understanding with no further questions at this time.

## 2021-07-09 NOTE — Telephone Encounter (Signed)
-----   Message from Theora Gianotti, NP sent at 07/03/2021  8:34 AM EDT ----- We stopped spironolactone and potassium supplement yesterday.  His potassium was on the high side of normal on this check.  Creatinine is better than when seen @ [facility], just above baseline.  Plan to f/u bmet in 1 week to reassess K and Creat off of spiro and supplemental potassium.

## 2021-07-09 NOTE — Progress Notes (Signed)
Daily Session Note  Patient Details  Name: [name] MRN: [id number] Date of Birth: [birth year] Referring Provider:   Flowsheet Row Cardiac Rehab from 03/24/2021 in [facility] Cardiac and Pulmonary Rehab  Referring Provider [name] MD       Encounter Date: 07/09/2021  Check In:  Session Check In - 07/09/21 1715       Check-In   Supervising physician immediately available to respond to emergencies See telemetry face sheet for immediately available ER MD    Location [facility]-Cardiac & Pulmonary Rehab    Staff Present Renita Papa, RN BSN;Joseph Tessie [place], [place]    Virtual Visit No    Medication changes reported     No    Fall or balance concerns reported    No    Warm-up and Cool-down Performed on first and last piece of equipment    Resistance Training Performed Yes    VAD Patient? No    PAD/SET Patient? No      Pain Assessment   Currently in Pain? No/denies                Social History   Tobacco Use  Smoking Status Former   Types: Cigarettes   Quit date: 07/26/1996   Years since quitting: 24.9  Smokeless Tobacco Never    Goals Met:  Independence with exercise equipment Exercise tolerated well No report of concerns or symptoms today Strength training completed today  Goals Unmet:  Not Applicable  Comments: Pt able to follow exercise prescription today without complaint.  Will continue to monitor for progression.    Dr. Emily Filbert is Medical Director for [place].  Dr. Ottie Glazier is Medical Director for [facility] Pulmonary Rehabilitation.

## 2021-07-09 NOTE — Telephone Encounter (Signed)
See other telephone encounter dated 07/09/21. Results and recommendations reviewed with patient and labs drawn yesterday at [facility] and sent that encounter for provider to review. Closing this encounter.

## 2021-07-10 NOTE — Telephone Encounter (Signed)
Reviewed provider review of results. He verbalized understanding with no further questions at this time.

## 2021-07-13 ENCOUNTER — Other Ambulatory Visit: Payer: Self-pay | Admitting: Family

## 2021-07-15 ENCOUNTER — Other Ambulatory Visit: Payer: Self-pay

## 2021-07-15 ENCOUNTER — Encounter: Payer: Medicare HMO | Admitting: *Deleted

## 2021-07-15 ENCOUNTER — Encounter: Payer: Self-pay | Admitting: *Deleted

## 2021-07-15 DIAGNOSIS — I214 Non-ST elevation (NSTEMI) myocardial infarction: Secondary | ICD-10-CM

## 2021-07-15 DIAGNOSIS — Z951 Presence of aortocoronary bypass graft: Secondary | ICD-10-CM

## 2021-07-15 NOTE — Progress Notes (Signed)
Cardiac Individual Treatment Plan  Patient Details  Name: [name] MRN: [id number] Date of Birth: Sep 27, 1935 Referring Provider:   Flowsheet Row Cardiac Rehab from 03/24/2021 in [facility] Cardiac and Pulmonary Rehab  Referring Provider [name] MD       Initial Encounter Date:  Flowsheet Row Cardiac Rehab from 03/24/2021 in [facility] Cardiac and Pulmonary Rehab  Date 03/24/21       Visit Diagnosis: NSTEMI (non-ST elevation myocardial infarction) ([place])  S/P CABG x 5  Patient's Home Medications on Admission:  Current Outpatient Medications:    acetaminophen (TYLENOL) 500 MG tablet, Take 500-1,000 mg by mouth every 6 (six) hours as needed for mild pain or moderate pain. , Disp: , Rfl:    acyclovir (ZOVIRAX) 800 MG tablet, Take 800 mg by mouth daily., Disp: , Rfl:    amLODipine (NORVASC) 10 MG tablet, Take 1 tablet (10 mg total) by mouth daily., Disp: 90 tablet, Rfl: 1   aspirin EC 81 MG tablet, Take 81 mg by mouth daily., Disp: , Rfl:    clopidogrel (PLAVIX) 75 MG tablet, TAKE 1 TABLET BY MOUTH EVERY DAY, Disp: 90 tablet, Rfl: 1   fluticasone (FLOVENT HFA) 44 MCG/ACT inhaler, Inhale 2 puffs into the lungs 2 (two) times daily., Disp: , Rfl:    furosemide (LASIX) 80 MG tablet, Take 1 tablet (80 mg total) by mouth daily., Disp: 90 tablet, Rfl: 2   hydrALAZINE (APRESOLINE) 25 MG tablet, Take 1 tablet (25 mg total) by mouth in the morning and at bedtime., Disp: 180 tablet, Rfl: 3   insulin detemir (LEVEMIR) 100 UNIT/ML injection, Inject 0.2 mLs (20 Units total) into the skin 2 (two) times daily., Disp: 10 mL, Rfl: 3   magnesium oxide (MAG-OX) 400 MG tablet, TAKE 1 TABLET BY MOUTH EVERY DAY, Disp: 90 tablet, Rfl: 1   Multiple Vitamin (MULTIVITAMIN WITH MINERALS) TABS tablet, Take 1 tablet by mouth daily., Disp: , Rfl:    oxybutynin (DITROPAN) 5 MG tablet, Take 5 mg by mouth 2 (two) times daily., Disp: , Rfl:    pantoprazole (PROTONIX) 40 MG tablet, Take 40 mg by mouth 2 (two) times  daily., Disp: , Rfl:    rosuvastatin (CRESTOR) 40 MG tablet, Take 1 tablet (40 mg total) by mouth daily., Disp: 90 tablet, Rfl: 1   traMADol (ULTRAM) 50 MG tablet, Take 1 tablet (50 mg total) by mouth every 12 (twelve) hours as needed for severe pain. Further refill request will need to be sent to primary care., Disp: 30 tablet, Rfl: 0  Past Medical History: Past Medical History:  Diagnosis Date   CAD (coronary artery disease)    a. 08/2019 VF arrest-->sev 3vd on cath-->CABGx5 (LIMA->LAD, VG->OM->LCX, VG->RPDA, VG->Diag).   CKD (chronic kidney disease), stage III (HCC)    Diabetes mellitus without complication ([place])    HFimpEF (heart failure with improved ejection fraction) ([place])    a. 08/2020 Echo: EF 40-45%; b. 11/2020 Echo: EF 55%, no rwma, mild LVH, Gr2 DD. nl RV fxn.   Hyperlipidemia LDL goal <70    Hypertension    Ischemic cardiomyopathy    a. 08/2020 Echo: EF 40-45%; b. 11/2020 Echo: EF 55%.    Tobacco Use: Social History   Tobacco Use  Smoking Status Former   Types: Cigarettes   Quit date: 07/26/1996   Years since quitting: 24.9  Smokeless Tobacco Never    Labs: Recent Review Flowsheet Data     Labs for ITP Cardiac and Pulmonary Rehab Latest Ref Rng & Units 08/29/2020  08/30/2020 08/30/2020 08/31/2020 09/01/2020   Cholestrol 0 - 200 mg/dL - - - - -   LDLCALC 0 - 99 mg/dL - - - - -   HDL >40 mg/dL - - - - -   Trlycerides <150 mg/dL - - - - -   Hemoglobin A1c 4.8 - 5.6 % - - - - -   PHART 7.350 - 7.450 7.342(L) 7.370 7.345(L) - -   PCO2ART 32.0 - 48.0 mmHg 35.8 35.6 36.1 - -   HCO3 20.0 - 28.0 mmol/L 19.7(L) 20.7 19.9(L) - -   TCO2 22 - 32 mmol/L 21(L) 22 21(L) - -   ACIDBASEDEF 0.0 - 2.0 mmol/L 6.0(H) 4.0(H) 5.0(H) - -   O2SAT % 90.0 94.0 96.0 75.9 70.0        Exercise Target Goals: Exercise Program Goal: Individual exercise prescription set using results from initial 6 min walk test and THRR while considering  patient's activity barriers and safety.    Exercise Prescription Goal: Initial exercise prescription builds to 30-45 minutes a day of aerobic activity, 2-3 days per week.  Home exercise guidelines will be given to patient during program as part of exercise prescription that the participant will acknowledge.   Education: Aerobic Exercise: - Group verbal and visual presentation on the components of exercise prescription. Introduces F.I.T.T principle from ACSM for exercise prescriptions.  Reviews F.I.T.T. principles of aerobic exercise including progression. Written material given at graduation. Flowsheet Row Cardiac Rehab from 04/15/2021 in [facility] Cardiac and Pulmonary Rehab  Education need identified 03/24/21       Education: Resistance Exercise: - Group verbal and visual presentation on the components of exercise prescription. Introduces F.I.T.T principle from ACSM for exercise prescriptions  Reviews F.I.T.T. principles of resistance exercise including progression. Written material given at graduation.    Education: Exercise & Equipment Safety: - Individual verbal instruction and demonstration of equipment use and safety with use of the equipment. Flowsheet Row Cardiac Rehab from 04/15/2021 in [facility] Cardiac and Pulmonary Rehab  Date 03/24/21  Educator [facility]  Instruction Review Code 1- Verbalizes Understanding       Education: Exercise Physiology & General Exercise Guidelines: - Group verbal and written instruction with models to review the exercise physiology of the cardiovascular system and associated critical values. Provides general exercise guidelines with specific guidelines to those with heart or lung disease.    Education: Flexibility, Balance, Mind/Body Relaxation: - Group verbal and visual presentation with interactive activity on the components of exercise prescription. Introduces F.I.T.T principle from ACSM for exercise prescriptions. Reviews F.I.T.T. principles of flexibility and balance exercise training including  progression. Also discusses the mind body connection.  Reviews various relaxation techniques to help reduce and manage stress (i.e. Deep breathing, progressive muscle relaxation, and visualization). Balance handout provided to take home. Written material given at graduation.   Activity Barriers & Risk Stratification:  Activity Barriers & Cardiac Risk Stratification - 03/24/21 1144       Activity Barriers & Cardiac Risk Stratification   Activity Barriers Joint Problems;Deconditioning;Muscular Weakness;Shortness of Breath;Balance Concerns    Cardiac Risk Stratification High             6 Minute Walk:  6 Minute Walk     Row Name 03/24/21 1144         6 Minute Walk   Phase Initial     Distance 800 feet     Walk Time 6 minutes     # of Rest Breaks 0     MPH 1.52  METS 1.54     RPE 13     Perceived Dyspnea  2     VO2 Peak 5.39     Symptoms Yes (comment)     Comments SOB, fatigue     Resting HR 63 bpm     Resting BP 142/80     Resting Oxygen Saturation  97 %     Exercise Oxygen Saturation  during 6 min walk 98 %     Max Ex. HR 109 bpm     Max Ex. BP 194/96     2 Minute Post BP 150/82              Oxygen Initial Assessment:   Oxygen Re-Evaluation:   Oxygen Discharge (Final Oxygen Re-Evaluation):   Initial Exercise Prescription:  Initial Exercise Prescription - 03/24/21 1100       Date of Initial Exercise RX and Referring Provider   Date 03/24/21    Referring Provider [name] MD      Recumbant Bike   Level 1    RPM 50    Watts 10    Minutes 15    METs 2      NuStep   Level 1    SPM 80    Minutes 15    METs 2      Track   Laps 20    Minutes 15    METs 2.1      Prescription Details   Frequency (times per week) 3    Duration Progress to 30 minutes of continuous aerobic without signs/symptoms of physical distress      Intensity   THRR 40-80% of Max Heartrate 93-122    Ratings of Perceived Exertion 11-13    Perceived Dyspnea 0-4       Progression   Progression Continue to progress workloads to maintain intensity without signs/symptoms of physical distress.      Resistance Training   Training Prescription Yes    Weight 3 lb    Reps 10-15             Perform Capillary Blood Glucose checks as needed.  Exercise Prescription Changes:   Exercise Prescription Changes     Row Name 03/24/21 1100 03/31/21 1500 04/14/21 1300 04/27/21 1800 04/28/21 1500     Response to Exercise   Blood Pressure (Admit) 142/80 134/82 118/62 -- 124/82   Blood Pressure (Exercise) 194/96 142/80 120/62 -- --   Blood Pressure (Exit) 136/76 128/82 120/60 -- 134/72   Heart Rate (Admit) 63 bpm 64 bpm 68 bpm -- 66 bpm   Heart Rate (Exercise) 109 bpm 85 bpm 80 bpm -- 98 bpm   Heart Rate (Exit) 54 bpm 69 bpm 59 bpm -- 60 bpm   Oxygen Saturation (Admit) 97 % -- -- -- --   Oxygen Saturation (Exercise) 98 % -- -- -- --   Rating of Perceived Exertion (Exercise) 13 13 13  -- 13   Perceived Dyspnea (Exercise) 2 -- -- -- --   Symptoms SOB, fatigue SOB, fatigue none -- none   Comments walk test results first full day of exercise -- -- --   Duration -- Progress to 30 minutes of  aerobic without signs/symptoms of physical distress Continue with 30 min of aerobic exercise without signs/symptoms of physical distress. -- Continue with 30 min of aerobic exercise without signs/symptoms of physical distress.   Intensity -- THRR unchanged THRR unchanged -- THRR unchanged     Progression   Progression -- Continue to  progress workloads to maintain intensity without signs/symptoms of physical distress. Continue to progress workloads to maintain intensity without signs/symptoms of physical distress. -- Continue to progress workloads to maintain intensity without signs/symptoms of physical distress.   Average METs -- 2.2 2.55 -- 2.8     Resistance Training   Training Prescription -- Yes Yes -- Yes   Weight -- 3 lb 3 lb -- 4 lb   Reps -- 10-15 10-15 -- 10-15      Interval Training   Interval Training -- No No -- No     NuStep   Level -- 1 1 -- 3   Minutes -- 30 30 -- 30   METs -- 2.2 2.55 -- 2.8     Home Exercise Plan   Plans to continue exercise at -- -- -- Home (comment)  walking, Youtube videos Home (comment)  walking, Youtube videos   Frequency -- -- -- Add 2 additional days to program exercise sessions.  Start with 1 day Add 2 additional days to program exercise sessions.  Start with 1 day   Initial Home Exercises Provided -- -- -- 04/27/21 04/27/21    Row Name 05/14/21 0800 05/27/21 1500 07/06/21 1100         Response to Exercise   Blood Pressure (Admit) 118/60 136/72 104/60     Blood Pressure (Exit) 130/66 102/58 108/64     Heart Rate (Admit) 55 bpm 68 bpm 52 bpm     Heart Rate (Exercise) 70 bpm 78 bpm 81 bpm     Heart Rate (Exit) 59 bpm 58 bpm 61 bpm     Rating of Perceived Exertion (Exercise) '13 13 12     '$ Symptoms none none none     Duration Continue with 30 min of aerobic exercise without signs/symptoms of physical distress. Continue with 30 min of aerobic exercise without signs/symptoms of physical distress. Continue with 30 min of aerobic exercise without signs/symptoms of physical distress.     Intensity THRR unchanged THRR unchanged THRR unchanged           Progression   Progression Continue to progress workloads to maintain intensity without signs/symptoms of physical distress. Continue to progress workloads to maintain intensity without signs/symptoms of physical distress. Continue to progress workloads to maintain intensity without signs/symptoms of physical distress.     Average METs 2.65 2.8 2.6           Resistance Training   Training Prescription Yes Yes Yes     Weight 4 lb 4 lb 4 lb     Reps 10-15 10-15 10-15           Interval Training   Interval Training No No No           NuStep   Level '4 3 4     '$ Minutes '30 30 30     '$ METs 3.1 2.8 2.6           Home Exercise Plan   Plans to continue exercise at  Home (comment)  walking, Youtube videos Home (comment)  walking, Youtube videos Home (comment)  walking, Youtube videos     Frequency Add 2 additional days to program exercise sessions.  Start with 1 day Add 2 additional days to program exercise sessions.  Start with 1 day Add 2 additional days to program exercise sessions.  Start with 1 day     Initial Home Exercises Provided 04/27/21 04/27/21 04/27/21  Exercise Comments:   Exercise Goals and Review:   Exercise Goals     Row Name 03/24/21 1147             Exercise Goals   Increase Physical Activity Yes       Intervention Provide advice, education, support and counseling about physical activity/exercise needs.;Develop an individualized exercise prescription for aerobic and resistive training based on initial evaluation findings, risk stratification, comorbidities and participant's personal goals.       Expected Outcomes Short Term: Attend rehab on a regular basis to increase amount of physical activity.;Long Term: Add in home exercise to make exercise part of routine and to increase amount of physical activity.;Long Term: Exercising regularly at least 3-5 days a week.       Increase Strength and Stamina Yes       Intervention Provide advice, education, support and counseling about physical activity/exercise needs.;Develop an individualized exercise prescription for aerobic and resistive training based on initial evaluation findings, risk stratification, comorbidities and participant's personal goals.       Expected Outcomes Short Term: Increase workloads from initial exercise prescription for resistance, speed, and METs.;Short Term: Perform resistance training exercises routinely during rehab and add in resistance training at home;Long Term: Improve cardiorespiratory fitness, muscular endurance and strength as measured by increased METs and functional capacity (6MWT)       Able to understand and use rate of perceived  exertion (RPE) scale Yes       Intervention Provide education and explanation on how to use RPE scale       Expected Outcomes Short Term: Able to use RPE daily in rehab to express subjective intensity level;Long Term:  Able to use RPE to guide intensity level when exercising independently       Able to understand and use Dyspnea scale Yes       Intervention Provide education and explanation on how to use Dyspnea scale       Expected Outcomes Short Term: Able to use Dyspnea scale daily in rehab to express subjective sense of shortness of breath during exertion;Long Term: Able to use Dyspnea scale to guide intensity level when exercising independently       Knowledge and understanding of Target Heart Rate Range (THRR) Yes       Intervention Provide education and explanation of THRR including how the numbers were predicted and where they are located for reference       Expected Outcomes Short Term: Able to state/look up THRR;Long Term: Able to use THRR to govern intensity when exercising independently;Short Term: Able to use daily as guideline for intensity in rehab       Able to check pulse independently Yes       Intervention Provide education and demonstration on how to check pulse in carotid and radial arteries.;Review the importance of being able to check your own pulse for safety during independent exercise       Expected Outcomes Short Term: Able to explain why pulse checking is important during independent exercise;Long Term: Able to check pulse independently and accurately       Understanding of Exercise Prescription Yes       Intervention Provide education, explanation, and written materials on patient's individual exercise prescription       Expected Outcomes Short Term: Able to explain program exercise prescription;Long Term: Able to explain home exercise prescription to exercise independently                Exercise Goals  Re-Evaluation :  Exercise Goals Re-Evaluation     [place] Name  03/30/21 1745 04/14/21 1305 04/27/21 1803 05/14/21 0847 05/18/21 1730     Exercise Goal Re-Evaluation   Exercise Goals Review Increase Physical Activity;Able to understand and use rate of perceived exertion (RPE) scale;Knowledge and understanding of Target Heart Rate Range (THRR);Understanding of Exercise Prescription;Increase Strength and Stamina;Able to check pulse independently Increase Physical Activity;Increase Strength and Stamina Increase Physical Activity;Increase Strength and Stamina Increase Physical Activity;Increase Strength and Stamina Increase Physical Activity;Increase Strength and Stamina;Understanding of Exercise Prescription   Comments Reviewed RPE and dyspnea scales, THR and program prescription with pt today.  Pt voiced understanding and was given a copy of goals to take home. Keiden can do 30 min on NS.  Staff will encourage trying to do some laps on track and increasing level on NS. Reviewed home exercise with pt today.  Pt plans to walk and complete Youtube staff videos for exercise.  Reviewed THR, pulse, RPE, sign and symptoms, pulse oximetery and when to call 911 or MD.  Also discussed weather considerations and indoor options.  Pt voiced understanding. Keilyn is doing well in rehab. He has physical limitations that do not allow him to walk but has increased his level on the T4 to level 4. Will continue to increase load and monitor progress. Skylan reports that he feels like he is getting stronger with his exercise and has progressed on his T4 NS level since starting the program. While he is limitied with walking he does try to do some walking with breaks as needed at home and is doing strength and stretching exercises at home on off days of rehab, following the home exercise guidelines provided by staff.   Expected Outcomes Short: Use RPE daily to regulate intensity. Long: Follow program prescription in THR. Short: try track/increase level on NS Long: improve overall stamina Short: Add on  1 day of exercise at home and monitor HR Long: Continue to exercise at home independently at appropriate exercise prescription Short: Continue building up tolerance on T4 Long: Continue to increase overall strength and stamina Short: continue to exercise at home and progress with intensity levels on T4 in cardiac rehab class. Long: become independent with exercise program and continue with exercise at home once he is discharged from program.    [place] Name 05/27/21 1507 06/24/21 1044 07/06/21 1150         Exercise Goal Re-Evaluation   Exercise Goals Review Increase Physical Activity;Increase Strength and Stamina;Understanding of Exercise Prescription Increase Physical Activity;Increase Strength and Stamina;Understanding of Exercise Prescription Increase Physical Activity;Increase Strength and Stamina     Comments Katie is doing well in rehab.  He is on level 3 for 30 min on the NuStep.  We will continue to encourage him to try walking more. We will continue to montior his progress. Coely has not attend rehab for several weeks as he has been out sick. COVID test was negative. Per patient, he feels better and plans to be back to rehab on 8/22. Dair is back and was able to resume where he left off.  Staff will monitor progress.     Expected Outcomes Short: Walk some in rehab.  Long: Continue to improve stamina Short: Attend rehab consistently again Long: Continue to increase overall strength and endurance Short: get back to consistent attendance Long:complete HT program              Discharge Exercise Prescription (Final Exercise Prescription Changes):  Exercise Prescription Changes -  07/06/21 1100       Response to Exercise   Blood Pressure (Admit) 104/60    Blood Pressure (Exit) 108/64    Heart Rate (Admit) 52 bpm    Heart Rate (Exercise) 81 bpm    Heart Rate (Exit) 61 bpm    Rating of Perceived Exertion (Exercise) 12    Symptoms none    Duration Continue with 30 min of aerobic exercise  without signs/symptoms of physical distress.    Intensity THRR unchanged      Progression   Progression Continue to progress workloads to maintain intensity without signs/symptoms of physical distress.    Average METs 2.6      Resistance Training   Training Prescription Yes    Weight 4 lb    Reps 10-15      Interval Training   Interval Training No      NuStep   Level 4    Minutes 30    METs 2.6      Home Exercise Plan   Plans to continue exercise at Home (comment)   walking, Youtube videos   Frequency Add 2 additional days to program exercise sessions.   Start with 1 day   Initial Home Exercises Provided 04/27/21             Nutrition:  Target Goals: Understanding of nutrition guidelines, daily intake of sodium '1500mg'$ , cholesterol '200mg'$ , calories 30% from fat and 7% or less from saturated fats, daily to have 5 or more servings of fruits and vegetables.  Education: All About Nutrition: -Group instruction provided by verbal, written material, interactive activities, discussions, models, and posters to present general guidelines for heart healthy nutrition including fat, fiber, MyPlate, the role of sodium in heart healthy nutrition, utilization of the nutrition label, and utilization of this knowledge for meal planning. Follow up email sent as well. Written material given at graduation. Flowsheet Row Cardiac Rehab from 04/15/2021 in [facility] Cardiac and Pulmonary Rehab  Education need identified 03/24/21       Biometrics:  Pre Biometrics - 03/24/21 1244       Pre Biometrics   Height 5' 8.6" (1.742 m)    Weight 228 lb 1.6 oz (103.5 kg)    BMI (Calculated) 34.1    Single Leg Stand 1.2 seconds              Nutrition Therapy Plan and Nutrition Goals:  Nutrition Therapy & Goals - 05/25/21 1721       Nutrition Therapy   Diet Heart healthy, low Na    Drug/Food Interactions Statins/Certain Fruits    Protein (specify units) 80g    Fiber 30 grams    Whole Grain  Foods 3 servings    Saturated Fats 12 max. grams    Fruits and Vegetables 8 servings/day    Sodium 1.5 grams      Personal Nutrition Goals   Nutrition Goal ST: add fruit to breakfast - he is thinking a banana, add veggies to lunch LT: maintain heart healthy changes    Comments Uses olive oil. B: oatmeal with pecans L: peanut butter sandwich on whole wheat D: wife cooks dinner. She will cook greens for dinner, skinless chicken leg or [place] wings. She uses some sea salt.  Drinks: water. Discussed heart healthy eating, Maurilio seems to have a balanced diet (he doesn't eat many carbohydrates for dinner - he coiuld include some starchy vegetables or whole grains or beans), he enjoys what he is eating and he is satisfied  during the day. Zael reports his weight has been stable.      Intervention Plan   Intervention Prescribe, educate and counsel regarding individualized specific dietary modifications aiming towards targeted core components such as weight, hypertension, lipid management, diabetes, heart failure and other comorbidities.;Nutrition handout(s) given to patient.    Expected Outcomes Short Term Goal: Understand basic principles of dietary content, such as calories, fat, sodium, cholesterol and nutrients.;Short Term Goal: A plan has been developed with personal nutrition goals set during dietitian appointment.;Long Term Goal: Adherence to prescribed nutrition plan.             Nutrition Assessments:  MEDIFICTS Score Key: ?70 Need to make dietary changes  40-70 Heart Healthy Diet ? 40 Therapeutic Level Cholesterol Diet  Flowsheet Row Cardiac Rehab from 03/24/2021 in [facility] Cardiac and Pulmonary Rehab  Picture Your Plate Total Score on Admission 80      Picture Your Plate Scores: [id number] Unhealthy dietary pattern with much room for improvement. 41-50 Dietary pattern unlikely to meet recommendations for good health and room for improvement. 51-60 More healthful dietary pattern, with some  room for improvement.  >60 Healthy dietary pattern, although there may be some specific behaviors that could be improved.    Nutrition Goals Re-Evaluation:  Nutrition Goals Re-Evaluation     [place] Name 04/16/21 1742 05/18/21 1739 07/02/21 1727         Goals   Current Weight 228 lb (103.4 kg) -- 222 lb (100.7 kg)     Nutrition Goal Possible snacking or eating a little more. -- Lose more weight     Comment Dawsen states that he is meeting with the dietician on 04/27/2021. He is not sure if he is eating enough food. He wants to lose some weight. He feels like he has some more energy since he started but would like to have more. Informed him to make a 3 day log of what he is eating to get an idea of his nutrition habits. [place] has missed several appointments with program dietician. He has been encouraged to reschedule his appointment in order to set specific nutrition goals. Imari has been out due to [place]. He states he is not over eating and is eating more vegetables and fruit. He also is eating more oatmeal for breakfast.Abenezer wants to reach a weight goal of 215 pounds.     Expected Outcome Short: meet with the dietician. Long: maintain a healthy diet that pertains to his needs. Short: meet with the dietician. Long: maintain a healthy diet that pertains to his needs. Short: lost 5 pounds in the next couple weeks. Long: reach a weight goal of 215 pounds.              Nutrition Goals Discharge (Final Nutrition Goals Re-Evaluation):  Nutrition Goals Re-Evaluation - 07/02/21 1727       Goals   Current Weight 222 lb (100.7 kg)    Nutrition Goal Lose more weight    Comment Cleotis has been out due to [place]. He states he is not over eating and is eating more vegetables and fruit. He also is eating more oatmeal for breakfast.Mc wants to reach a weight goal of 215 pounds.    Expected Outcome Short: lost 5 pounds in the next couple weeks. Long: reach a weight goal of 215 pounds.              Psychosocial: Target Goals: Acknowledge presence or absence of significant depression and/or stress, maximize coping skills, provide positive support  system. Participant is able to verbalize types and ability to use techniques and skills needed for reducing stress and depression.   Education: Stress, Anxiety, and Depression - Group verbal and visual presentation to define topics covered.  Reviews how body is impacted by stress, anxiety, and depression.  Also discusses healthy ways to reduce stress and to treat/manage anxiety and depression.  Written material given at graduation. Flowsheet Row Cardiac Rehab from 04/15/2021 in [facility] Cardiac and Pulmonary Rehab  Date 04/15/21  Educator AS  Instruction Review Code 1- Verbalizes Understanding       Education: Sleep Hygiene -Provides group verbal and written instruction about how sleep can affect your health.  Define sleep hygiene, discuss sleep cycles and impact of sleep habits. Review good sleep hygiene tips.    Initial Review & Psychosocial Screening:  Initial Psych Review & Screening - 03/18/21 1039       Initial Review   Current issues with None Identified      Family Dynamics   Good Support System? Yes   family     Barriers   Psychosocial barriers to participate in program There are no identifiable barriers or psychosocial needs.;The patient should benefit from training in stress management and relaxation.      Screening Interventions   Interventions Encouraged to exercise;Provide feedback about the scores to participant;To provide support and resources with identified psychosocial needs    Expected Outcomes Short Term goal: Utilizing psychosocial counselor, staff and physician to assist with identification of specific Stressors or current issues interfering with healing process. Setting desired goal for each stressor or current issue identified.;Long Term Goal: Stressors or current issues are controlled or eliminated.;Short Term  goal: Identification and review with participant of any Quality of Life or Depression concerns found by scoring the questionnaire.;Long Term goal: The participant improves quality of Life and PHQ9 Scores as seen by post scores and/or verbalization of changes             Quality of Life Scores:   Quality of Life - 03/24/21 1245       Quality of Life   Select Quality of Life      Quality of Life Scores   Health/Function Pre 26.26 %    Socioeconomic Pre 29.5 %    Psych/Spiritual Pre 28.8 %    Family Pre 27.6 %    GLOBAL Pre 27.78 %            Scores of 19 and below usually indicate a poorer quality of life in these areas.  A difference of  2-3 points is a clinically meaningful difference.  A difference of 2-3 points in the total score of the Quality of Life Index has been associated with significant improvement in overall quality of life, self-image, physical symptoms, and general health in studies assessing change in quality of life.  PHQ-9: Recent Review Flowsheet Data     Depression screen [facility] 2/9 03/24/2021   Decreased Interest 0   Down, Depressed, Hopeless 0   PHQ - 2 Score 0   Altered sleeping 0   Tired, decreased energy 0   Change in appetite 0   Feeling bad or failure about yourself  0   Trouble concentrating 0   Moving slowly or fidgety/restless 0   Suicidal thoughts 0   PHQ-9 Score 0   Difficult doing work/chores Not difficult at all      Interpretation of Total Score  Total Score Depression Severity:  1-4 = Minimal depression, 5-9 =  Mild depression, 10-14 = Moderate depression, 15-19 = Moderately severe depression, 20-27 = Severe depression   Psychosocial Evaluation and Intervention:  Psychosocial Evaluation - 03/18/21 1053       Psychosocial Evaluation & Interventions   Interventions Encouraged to exercise with the program and follow exercise prescription    Comments Mr. 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 He said he is  eating and sleeping well. He is still doing what he wants to do during the day. He does have a copay which will most likely hinder the length of his attendance. While he is in the program he wants to work on his diabetes and maintaining his independence.    Expected Outcomes Short: attend cardiac rehab for education and exercise. Long: develop positive self care habits.    Continue Psychosocial Services  Follow up required by staff             Psychosocial Re-Evaluation:  Psychosocial Re-Evaluation     [place] Name 04/16/21 1745 05/18/21 1729 07/02/21 1724         Psychosocial Re-Evaluation   Current issues with None Identified None Identified None Identified     Comments Patient reports no issues with their current mental states, sleep, stress, depression or anxiety. Will follow up with patient in a few weeks for any changes. Cambell has 5 kids and 22 grandkids. He sees his grandkids often. Patient reports no issues with their current mental states, sleep, stress, depression or anxiety. Will follow up with patient in a few weeks for any changes. Patient reports that he has a strong family support system. Patient reports no issues with their current mental states, sleep, stress, depression or anxiety. Will follow up with patient in a few weeks for any changes.     Expected Outcomes Short: Continue to exercise regularly to support mental health and notify staff of any changes. Long: maintain mental health and well being through teaching of rehab or prescribed medications independently. Short: Continue to exercise regularly to support mental health and notify staff of any changes. Long: maintain mental health and well being through teaching of rehab or prescribed medications independently. Short: Continue to exercise regularly to support mental health and notify staff of any changes. Long: maintain mental health and well being through teaching of rehab or prescribed medications independently.      Interventions Encouraged to attend Cardiac Rehabilitation for the exercise Encouraged to attend Cardiac Rehabilitation for the exercise Encouraged to attend Cardiac Rehabilitation for the exercise     Continue Psychosocial Services  Follow up required by staff Follow up required by staff Follow up required by staff              Psychosocial Discharge (Final Psychosocial Re-Evaluation):  Psychosocial Re-Evaluation - 07/02/21 1724       Psychosocial Re-Evaluation   Current issues with None Identified    Comments Patient reports no issues with their current mental states, sleep, stress, depression or anxiety. Will follow up with patient in a few weeks for any changes.    Expected Outcomes Short: Continue to exercise regularly to support mental health and notify staff of any changes. Long: maintain mental health and well being through teaching of rehab or prescribed medications independently.    Interventions Encouraged to attend Cardiac Rehabilitation for the exercise    Continue Psychosocial Services  Follow up required by staff             Vocational Rehabilitation: Provide vocational rehab  assistance to qualifying candidates.   Vocational Rehab Evaluation & Intervention:  Vocational Rehab - 03/18/21 1053       Initial Vocational Rehab Evaluation & Intervention   Assessment shows need for Vocational Rehabilitation No             Education: Education Goals: Education classes will be provided on a variety of topics geared toward better understanding of heart health and risk factor modification. Participant will state understanding/return demonstration of topics presented as noted by education test scores.  Learning Barriers/Preferences:  Learning Barriers/Preferences - 03/18/21 1040       Learning Barriers/Preferences   Learning Barriers None    Learning Preferences None             General Cardiac Education Topics:  AED/CPR: - Group verbal and written  instruction with the use of models to demonstrate the basic use of the AED with the basic ABC's of resuscitation.   Anatomy and Cardiac Procedures: - Group verbal and visual presentation and models provide information about basic cardiac anatomy and function. Reviews the testing methods done to diagnose heart disease and the outcomes of the test results. Describes the treatment choices: Medical Management, Angioplasty, or Coronary Bypass Surgery for treating various heart conditions including Myocardial Infarction, Angina, Valve Disease, and Cardiac Arrhythmias.  Written material given at graduation. Flowsheet Row Cardiac Rehab from 04/15/2021 in [facility] Cardiac and Pulmonary Rehab  Education need identified 03/24/21       Medication Safety: - Group verbal and visual instruction to review commonly prescribed medications for heart and lung disease. Reviews the medication, class of the drug, and side effects. Includes the steps to properly store meds and maintain the prescription regimen.  Written material given at graduation.   Intimacy: - Group verbal instruction through game format to discuss how heart and lung disease can affect sexual intimacy. Written material given at graduation..   Know Your Numbers and Heart Failure: - Group verbal and visual instruction to discuss disease risk factors for cardiac and pulmonary disease and treatment options.  Reviews associated critical values for Overweight/Obesity, Hypertension, Cholesterol, and Diabetes.  Discusses basics of heart failure: signs/symptoms and treatments.  Introduces Heart Failure Zone chart for action plan for heart failure.  Written material given at graduation. Flowsheet Row Cardiac Rehab from 04/15/2021 in [facility] Cardiac and Pulmonary Rehab  Date 04/01/21  Educator [facility]  Instruction Review Code 1- Verbalizes Understanding       Infection Prevention: - Provides verbal and written material to individual with discussion of infection  control including proper hand washing and proper equipment cleaning during exercise session. Flowsheet Row Cardiac Rehab from 04/15/2021 in [facility] Cardiac and Pulmonary Rehab  Date 03/24/21  Educator [facility]  Instruction Review Code 1- Verbalizes Understanding       Falls Prevention: - Provides verbal and written material to individual with discussion of falls prevention and safety. Flowsheet Row Cardiac Rehab from 04/15/2021 in [facility] Cardiac and Pulmonary Rehab  Date 03/24/21  Educator [facility]  Instruction Review Code 1- Verbalizes Understanding       Other: -Provides group and verbal instruction on various topics (see comments)   Knowledge Questionnaire Score:  Knowledge Questionnaire Score - 03/24/21 1246       Knowledge Questionnaire Score   Pre Score 19/26 Education Focus: nutrition, exercise, angina, cessation             Core Components/Risk Factors/Patient Goals at Admission:  Personal Goals and Risk Factors at Admission - 03/24/21 1246  Core Components/Risk Factors/Patient Goals on Admission    Weight Management Yes;Obesity;Weight Loss    Intervention Weight Management: Develop a combined nutrition and exercise program designed to reach desired caloric intake, while maintaining appropriate intake of nutrient and fiber, sodium and fats, and appropriate energy expenditure required for the weight goal.;Weight Management: Provide education and appropriate resources to help participant work on and attain dietary goals.;Weight Management/Obesity: Establish reasonable short term and long term weight goals.;Obesity: Provide education and appropriate resources to help participant work on and attain dietary goals.    Admit Weight 228 lb 1.6 oz (103.5 kg)    Goal Weight: Short Term 223 lb (101.2 kg)    Goal Weight: Long Term 220 lb (99.8 kg)    Expected Outcomes Short Term: Continue to assess and modify interventions until short term weight is achieved;Long Term: Adherence to  nutrition and physical activity/exercise program aimed toward attainment of established weight goal;Weight Loss: Understanding of general recommendations for a balanced deficit meal plan, which promotes 1-2 lb weight loss per week and includes a negative energy balance of [phone] kcal/d;Understanding recommendations for meals to include 15-35% energy as protein, 25-35% energy from fat, 35-60% energy from carbohydrates, less than '200mg'$  of dietary cholesterol, 20-35 gm of total fiber daily;Understanding of distribution of calorie intake throughout the day with the consumption of 4-5 meals/snacks    Diabetes Yes    Intervention Provide education about signs/symptoms and action to take for hypo/hyperglycemia.;Provide education about proper nutrition, including hydration, and aerobic/resistive exercise prescription along with prescribed medications to achieve blood glucose in normal ranges: Fasting glucose 65-99 mg/dL    Expected Outcomes Short Term: Participant verbalizes understanding of the signs/symptoms and immediate care of hyper/hypoglycemia, proper foot care and importance of medication, aerobic/resistive exercise and nutrition plan for blood glucose control.;Long Term: Attainment of HbA1C < 7%.    Hypertension Yes    Intervention Provide education on lifestyle modifcations including regular physical activity/exercise, weight management, moderate sodium restriction and increased consumption of fresh fruit, vegetables, and low fat dairy, alcohol moderation, and smoking cessation.;Monitor prescription use compliance.    Expected Outcomes Short Term: Continued assessment and intervention until BP is < 140/12m HG in hypertensive participants. < 130/878mHG in hypertensive participants with diabetes, heart failure or chronic kidney disease.;Long Term: Maintenance of blood pressure at goal levels.    Lipids Yes    Intervention Provide education and support for participant on nutrition & aerobic/resistive  exercise along with prescribed medications to achieve LDL '70mg'$ , HDL >'40mg'$ .    Expected Outcomes Short Term: Participant states understanding of desired cholesterol values and is compliant with medications prescribed. Participant is following exercise prescription and nutrition guidelines.;Long Term: Cholesterol controlled with medications as prescribed, with individualized exercise RX and with personalized nutrition plan. Value goals: LDL < '70mg'$ , HDL > 40 mg.             Education:Diabetes - Individual verbal and written instruction to review signs/symptoms of diabetes, desired ranges of glucose level fasting, after meals and with exercise. Acknowledge that pre and post exercise glucose checks will be done for 3 sessions at entry of program. [facility]  Date 03/18/21  [facility]ruction Review Code 1- Verbalizes Understanding       Core Components/Risk Factors/Patient Goals Review:   Goals and Risk Factor Review     Row Name 04/16/21 1739 05/18/21 1734 07/02/21 1725         Core Components/Risk  Factors/Patient Goals Review   Personal Goals Review Weight Management/Obesity;Diabetes Weight Management/Obesity;Diabetes Weight Management/Obesity     Review Nemanja states that his blood sugars have been good at home between 120 and 130 fasting. He is still trying to lose a couple pounds. He wants to reach a weight goal of 200 pounds. Informed patient that he needs to figure out if what he is eating is going to help him lose weight. Gerhardt reports that he continues to check his blood sugars at home and they remain withing acceptable ranges. He stated that he has not seen too much of a change in in his weight since starting the program. He has been consistent with exercise but was encouraged to reschedule his dietician appointment that was missed in order to work on nutrician goals. Kenshin wants to work on weight loss. He has  been out of Rehab due to getting COVID. He is going to try to work on his weight loss with diet and exercise. Now that he is able to exercise again his weight may improve.     Expected Outcomes Short: lose some weight. Long: lose 10 pounds in the program. Short: meet with dietican to to discuss nutition aspect with reguards to weight loss. Long: lose 10 pounsd in the program. Short: lose more weight. Long: reach patients weight goal.              Core Components/Risk Factors/Patient Goals at Discharge (Final Review):   Goals and Risk Factor Review - 07/02/21 1725       Core Components/Risk Factors/Patient Goals Review   Personal Goals Review Weight Management/Obesity    Review Yaret wants to work on weight loss. He has been out of Rehab due to getting COVID. He is going to try to work on his weight loss with diet and exercise. Now that he is able to exercise again his weight may improve.    Expected Outcomes Short: lose more weight. Long: reach patients weight goal.             ITP Comments:  ITP Comments     Row Name 03/18/21 1059 03/24/21 1123 03/25/21 0719 03/30/21 1745 04/22/21 0747   ITP Comments Initial telephone orientation completed. Diagnosis can be found in [facility] 4/22. EP orientation scheduled for Tuesday 5/17 at 10am. Completed 6MWT and gym orientation. Initial ITP created and sent for review to Dr. Emily Filbert, Medical Director. 30 Day review completed. Medical Director ITP review done, changes made as directed, and signed approval by Medical Director.   New to program First full day of exercise!  Patient was oriented to gym and equipment including functions, settings, policies, and procedures.  Patient's individual exercise prescription and treatment plan were reviewed.  All starting workloads were established based on the results of the 6 minute walk test done at initial orientation visit.  The plan for exercise progression was also introduced and progression will be  customized based on patient's performance and goals 30 Day review completed. Medical Director ITP review done, changes made as directed, and signed approval by Medical Director.    [place] Name 05/20/21 0840 06/10/21 1143 06/17/21 0727 06/24/21 1040 07/15/21 0627   ITP Comments 30 Day review completed. Medical Director ITP review done, changes made as directed, and signed approval by Medical Director. Guinn has not attended since last review 30 Day review completed. Medical Director ITP review done, changes made as directed, and signed approval by Medical Director. Jarl has not attended rehab recently due to feeling  sick. He tested negative for COVID. Staff reached out to him today who stated he will plan to be back next Monday, 8/22. 30 Day review completed. Medical Director ITP review done, changes made as directed, and signed approval by Medical Director.            Comments:

## 2021-07-15 NOTE — Progress Notes (Signed)
Daily Session Note  Patient Details  Name: [name] MRN: [id number] Date of Birth: 01/13/1935 Referring Provider:   Flowsheet Row Cardiac Rehab from 03/24/2021 in [facility] Cardiac and Pulmonary Rehab  Referring Provider [name] MD       Encounter Date: 07/15/2021  Check In:  Session Check In - 07/15/21 1837       Check-In   Supervising physician immediately available to respond to emergencies See telemetry face sheet for immediately available ER MD    Location [facility]-Cardiac & Pulmonary Rehab    Staff Present Nyoka Cowden, RN, BSN, Tyna Jaksch, MS, ASCM CEP, Exercise Physiologist    Virtual Visit No    Medication changes reported     No    Fall or balance concerns reported    No    Tobacco Cessation No Change    Warm-up and Cool-down Performed on first and last piece of equipment    Resistance Training Performed No    VAD Patient? No    PAD/SET Patient? No      Pain Assessment   Currently in Pain? No/denies                Social History   Tobacco Use  Smoking Status Former   Types: Cigarettes   Quit date: 07/26/1996   Years since quitting: 24.9  Smokeless Tobacco Never    Goals Met:  Independence with exercise equipment Personal goals reviewed No report of concerns or symptoms today  Goals Unmet:  Not Applicable  Comments: .exg   Dr. Emily Filbert is Medical Director for [place].  Dr. Ottie Glazier is Medical Director for [facility] Pulmonary Rehabilitation.

## 2021-07-16 ENCOUNTER — Encounter: Payer: Medicare HMO | Admitting: *Deleted

## 2021-07-16 DIAGNOSIS — I214 Non-ST elevation (NSTEMI) myocardial infarction: Secondary | ICD-10-CM

## 2021-07-16 DIAGNOSIS — Z951 Presence of aortocoronary bypass graft: Secondary | ICD-10-CM

## 2021-07-16 NOTE — Progress Notes (Signed)
Daily Session Note  Patient Details  Name: Cleon Signorelli MRN: [id number] Date of Birth: 05/17/1935 Referring Provider:   Flowsheet Row Cardiac Rehab from 03/24/2021 in [facility] Cardiac and Pulmonary Rehab  Referring Provider [name] MD       Encounter Date: 07/16/2021  Check In:  Session Check In - 07/16/21 1718       Check-In   Supervising physician immediately available to respond to emergencies See telemetry face sheet for immediately available ER MD    Location [facility]-Cardiac & Pulmonary Rehab    Staff Present Renita Papa, RN BSN;Joseph Vining, RCP,RRT,BSRT;Kara Teller, [place], ASCM CEP, Exercise Physiologist    Virtual Visit No    Medication changes reported     No    Fall or balance concerns reported    No    Warm-up and Cool-down Performed on first and last piece of equipment    Resistance Training Performed Yes    VAD Patient? No    PAD/SET Patient? No      Pain Assessment   Currently in Pain? No/denies                Social History   Tobacco Use  Smoking Status Former   Types: Cigarettes   Quit date: 07/26/1996   Years since quitting: 24.9  Smokeless Tobacco Never    Goals Met:  Independence with exercise equipment Exercise tolerated well No report of concerns or symptoms today Strength training completed today  Goals Unmet:  Not Applicable  Comments: Pt able to follow exercise prescription today without complaint.  Will continue to monitor for progression.    Dr. Emily Filbert is Medical Director for [place].  Dr. Ottie Glazier is Medical Director for [facility] Pulmonary Rehabilitation.

## 2021-07-20 ENCOUNTER — Encounter: Payer: Medicare HMO | Admitting: *Deleted

## 2021-07-20 ENCOUNTER — Other Ambulatory Visit: Payer: Self-pay

## 2021-07-20 DIAGNOSIS — I214 Non-ST elevation (NSTEMI) myocardial infarction: Secondary | ICD-10-CM

## 2021-07-20 DIAGNOSIS — Z951 Presence of aortocoronary bypass graft: Secondary | ICD-10-CM

## 2021-07-20 NOTE — Progress Notes (Signed)
Daily Session Note  Patient Details  Name: [name] MRN: [id number] Date of Birth: 1935/05/03 Referring Provider:   Flowsheet Row Cardiac Rehab from 03/24/2021 in [facility] Cardiac and Pulmonary Rehab  Referring Provider [name] MD       Encounter Date: 07/20/2021  Check In:  Session Check In - 07/20/21 1726       Check-In   Supervising physician immediately available to respond to emergencies See telemetry face sheet for immediately available ER MD    Location [facility]-Cardiac & Pulmonary Rehab    Staff Present Renita Papa, RN BSN;Joseph [place], RCP,RRT,BSRT;Kara [place], [place], ASCM CEP, Exercise Physiologist    Virtual Visit No    Medication changes reported     No    Fall or balance concerns reported    No    Warm-up and Cool-down Performed on first and last piece of equipment    Resistance Training Performed Yes    VAD Patient? No    PAD/SET Patient? No      Pain Assessment   Currently in Pain? No/denies                Social History   Tobacco Use  Smoking Status Former   Types: Cigarettes   Quit date: 07/26/1996   Years since quitting: 25.0  Smokeless Tobacco Never    Goals Met:  Independence with exercise equipment Exercise tolerated well No report of concerns or symptoms today Strength training completed today  Goals Unmet:  Not Applicable  Comments: Pt able to follow exercise prescription today without complaint.  Will continue to monitor for progression.    Dr. Emily Filbert is Medical Director for [place].  Dr. Ottie Glazier is Medical Director for [facility] Pulmonary Rehabilitation.

## 2021-07-27 ENCOUNTER — Encounter: Payer: Medicare HMO | Admitting: *Deleted

## 2021-07-27 ENCOUNTER — Other Ambulatory Visit: Payer: Self-pay

## 2021-07-27 DIAGNOSIS — I214 Non-ST elevation (NSTEMI) myocardial infarction: Secondary | ICD-10-CM | POA: Diagnosis not present

## 2021-07-27 DIAGNOSIS — Z951 Presence of aortocoronary bypass graft: Secondary | ICD-10-CM

## 2021-07-27 NOTE — Progress Notes (Signed)
Daily Session Note  Patient Details  Name: [name] MRN: [id number] Date of Birth: 03/31/1935 Referring Provider:   Flowsheet Row Cardiac Rehab from 03/24/2021 in [facility] Cardiac and Pulmonary Rehab  Referring Provider [name] MD       Encounter Date: 07/27/2021  Check In:  Session Check In - 07/27/21 1721       Check-In   Supervising physician immediately available to respond to emergencies See telemetry face sheet for immediately available ER MD    Location [facility]-Cardiac & Pulmonary Rehab    Staff Present Renita Papa, RN BSN;Joseph [place], RCP,RRT,BSRT;Kara [place], [place], ASCM CEP, Exercise Physiologist    Virtual Visit No    Medication changes reported     No    Fall or balance concerns reported    No    Warm-up and Cool-down Performed on first and last piece of equipment    Resistance Training Performed Yes    VAD Patient? No    PAD/SET Patient? No      Pain Assessment   Currently in Pain? No/denies                Social History   Tobacco Use  Smoking Status Former   Types: Cigarettes   Quit date: 07/26/1996   Years since quitting: 25.0  Smokeless Tobacco Never    Goals Met:  Independence with exercise equipment Exercise tolerated well No report of concerns or symptoms today Strength training completed today  Goals Unmet:  Not Applicable  Comments: Pt able to follow exercise prescription today without complaint.  Will continue to monitor for progression.    Dr. Emily Filbert is Medical Director for [place].  Dr. Ottie Glazier is Medical Director for [facility] Pulmonary Rehabilitation.

## 2021-07-29 ENCOUNTER — Other Ambulatory Visit: Payer: Self-pay

## 2021-07-29 DIAGNOSIS — I214 Non-ST elevation (NSTEMI) myocardial infarction: Secondary | ICD-10-CM | POA: Diagnosis not present

## 2021-07-29 DIAGNOSIS — Z951 Presence of aortocoronary bypass graft: Secondary | ICD-10-CM

## 2021-07-29 NOTE — Progress Notes (Signed)
Cardiac Individual Treatment Plan  Patient Details  Name: [name] MRN: [id number] Date of Birth: November 20, 1934 Referring Provider:   Flowsheet Row Cardiac Rehab from 03/24/2021 in [facility] Cardiac and Pulmonary Rehab  Referring Provider [name] MD       Initial Encounter Date:  Flowsheet Row Cardiac Rehab from 03/24/2021 in [facility] Cardiac and Pulmonary Rehab  Date 03/24/21       Visit Diagnosis: NSTEMI (non-ST elevation myocardial infarction) ([place])  S/P CABG x 5  Patient's Home Medications on Admission:  Current Outpatient Medications:    acetaminophen (TYLENOL) 500 MG tablet, Take 500-1,000 mg by mouth every 6 (six) hours as needed for mild pain or moderate pain. , Disp: , Rfl:    acyclovir (ZOVIRAX) 800 MG tablet, Take 800 mg by mouth daily., Disp: , Rfl:    amLODipine (NORVASC) 10 MG tablet, Take 1 tablet (10 mg total) by mouth daily., Disp: 90 tablet, Rfl: 1   aspirin EC 81 MG tablet, Take 81 mg by mouth daily., Disp: , Rfl:    clopidogrel (PLAVIX) 75 MG tablet, TAKE 1 TABLET BY MOUTH EVERY DAY, Disp: 90 tablet, Rfl: 1   fluticasone (FLOVENT HFA) 44 MCG/ACT inhaler, Inhale 2 puffs into the lungs 2 (two) times daily., Disp: , Rfl:    furosemide (LASIX) 80 MG tablet, Take 1 tablet (80 mg total) by mouth daily., Disp: 90 tablet, Rfl: 2   hydrALAZINE (APRESOLINE) 25 MG tablet, Take 1 tablet (25 mg total) by mouth in the morning and at bedtime., Disp: 180 tablet, Rfl: 3   insulin detemir (LEVEMIR) 100 UNIT/ML injection, Inject 0.2 mLs (20 Units total) into the skin 2 (two) times daily., Disp: 10 mL, Rfl: 3   magnesium oxide (MAG-OX) 400 MG tablet, TAKE 1 TABLET BY MOUTH EVERY DAY, Disp: 90 tablet, Rfl: 1   Multiple Vitamin (MULTIVITAMIN WITH MINERALS) TABS tablet, Take 1 tablet by mouth daily., Disp: , Rfl:    oxybutynin (DITROPAN) 5 MG tablet, Take 5 mg by mouth 2 (two) times daily., Disp: , Rfl:    pantoprazole (PROTONIX) 40 MG tablet, Take 40 mg by mouth 2 (two) times  daily., Disp: , Rfl:    rosuvastatin (CRESTOR) 40 MG tablet, Take 1 tablet (40 mg total) by mouth daily., Disp: 90 tablet, Rfl: 1   traMADol (ULTRAM) 50 MG tablet, Take 1 tablet (50 mg total) by mouth every 12 (twelve) hours as needed for severe pain. Further refill request will need to be sent to primary care., Disp: 30 tablet, Rfl: 0  Past Medical History: Past Medical History:  Diagnosis Date   CAD (coronary artery disease)    a. 08/2019 VF arrest-->sev 3vd on cath-->CABGx5 (LIMA->LAD, VG->OM->LCX, VG->RPDA, VG->Diag).   CKD (chronic kidney disease), stage III (HCC)    Diabetes mellitus without complication ([place])    HFimpEF (heart failure with improved ejection fraction) ([place])    a. 08/2020 Echo: EF 40-45%; b. 11/2020 Echo: EF 55%, no rwma, mild LVH, Gr2 DD. nl RV fxn.   Hyperlipidemia LDL goal <70    Hypertension    Ischemic cardiomyopathy    a. 08/2020 Echo: EF 40-45%; b. 11/2020 Echo: EF 55%.    Tobacco Use: Social History   Tobacco Use  Smoking Status Former   Types: Cigarettes   Quit date: 07/26/1996   Years since quitting: 25.0  Smokeless Tobacco Never    Labs: Recent Review Flowsheet Data     Labs for ITP Cardiac and Pulmonary Rehab Latest Ref Rng & Units 08/29/2020  08/30/2020 08/30/2020 08/31/2020 09/01/2020   Cholestrol 0 - 200 mg/dL - - - - -   LDLCALC 0 - 99 mg/dL - - - - -   HDL >40 mg/dL - - - - -   Trlycerides <150 mg/dL - - - - -   Hemoglobin A1c 4.8 - 5.6 % - - - - -   PHART 7.350 - 7.450 7.342(L) 7.370 7.345(L) - -   PCO2ART 32.0 - 48.0 mmHg 35.8 35.6 36.1 - -   HCO3 20.0 - 28.0 mmol/L 19.7(L) 20.7 19.9(L) - -   TCO2 22 - 32 mmol/L 21(L) 22 21(L) - -   ACIDBASEDEF 0.0 - 2.0 mmol/L 6.0(H) 4.0(H) 5.0(H) - -   O2SAT % 90.0 94.0 96.0 75.9 70.0        Exercise Target Goals: Exercise Program Goal: Individual exercise prescription set using results from initial 6 min walk test and THRR while considering  patient's activity barriers and safety.    Exercise Prescription Goal: Initial exercise prescription builds to 30-45 minutes a day of aerobic activity, 2-3 days per week.  Home exercise guidelines will be given to patient during program as part of exercise prescription that the participant will acknowledge.   Education: Aerobic Exercise: - Group verbal and visual presentation on the components of exercise prescription. Introduces F.I.T.T principle from ACSM for exercise prescriptions.  Reviews F.I.T.T. principles of aerobic exercise including progression. Written material given at graduation. Flowsheet Row Cardiac Rehab from 04/15/2021 in [facility] Cardiac and Pulmonary Rehab  Education need identified 03/24/21       Education: Resistance Exercise: - Group verbal and visual presentation on the components of exercise prescription. Introduces F.I.T.T principle from ACSM for exercise prescriptions  Reviews F.I.T.T. principles of resistance exercise including progression. Written material given at graduation.    Education: Exercise & Equipment Safety: - Individual verbal instruction and demonstration of equipment use and safety with use of the equipment. Flowsheet Row Cardiac Rehab from 04/15/2021 in [facility] Cardiac and Pulmonary Rehab  Date 03/24/21  Educator [facility]  Instruction Review Code 1- Verbalizes Understanding       Education: Exercise Physiology & General Exercise Guidelines: - Group verbal and written instruction with models to review the exercise physiology of the cardiovascular system and associated critical values. Provides general exercise guidelines with specific guidelines to those with heart or lung disease.    Education: Flexibility, Balance, Mind/Body Relaxation: - Group verbal and visual presentation with interactive activity on the components of exercise prescription. Introduces F.I.T.T principle from ACSM for exercise prescriptions. Reviews F.I.T.T. principles of flexibility and balance exercise training including  progression. Also discusses the mind body connection.  Reviews various relaxation techniques to help reduce and manage stress (i.e. Deep breathing, progressive muscle relaxation, and visualization). Balance handout provided to take home. Written material given at graduation.   Activity Barriers & Risk Stratification:  Activity Barriers & Cardiac Risk Stratification - 03/24/21 1144       Activity Barriers & Cardiac Risk Stratification   Activity Barriers Joint Problems;Deconditioning;Muscular Weakness;Shortness of Breath;Balance Concerns    Cardiac Risk Stratification High             6 Minute Walk:  6 Minute Walk     Row Name 03/24/21 1144         6 Minute Walk   Phase Initial     Distance 800 feet     Walk Time 6 minutes     # of Rest Breaks 0     MPH 1.52  METS 1.54     RPE 13     Perceived Dyspnea  2     VO2 Peak 5.39     Symptoms Yes (comment)     Comments SOB, fatigue     Resting HR 63 bpm     Resting BP 142/80     Resting Oxygen Saturation  97 %     Exercise Oxygen Saturation  during 6 min walk 98 %     Max Ex. HR 109 bpm     Max Ex. BP 194/96     2 Minute Post BP 150/82              Oxygen Initial Assessment:   Oxygen Re-Evaluation:   Oxygen Discharge (Final Oxygen Re-Evaluation):   Initial Exercise Prescription:  Initial Exercise Prescription - 03/24/21 1100       Date of Initial Exercise RX and Referring Provider   Date 03/24/21    Referring Provider [name] MD      Recumbant Bike   Level 1    RPM 50    Watts 10    Minutes 15    METs 2      NuStep   Level 1    SPM 80    Minutes 15    METs 2      Track   Laps 20    Minutes 15    METs 2.1      Prescription Details   Frequency (times per week) 3    Duration Progress to 30 minutes of continuous aerobic without signs/symptoms of physical distress      Intensity   THRR 40-80% of Max Heartrate 93-122    Ratings of Perceived Exertion 11-13    Perceived Dyspnea 0-4       Progression   Progression Continue to progress workloads to maintain intensity without signs/symptoms of physical distress.      Resistance Training   Training Prescription Yes    Weight 3 lb    Reps 10-15             Perform Capillary Blood Glucose checks as needed.  Exercise Prescription Changes:   Exercise Prescription Changes     Row Name 03/24/21 1100 03/31/21 1500 04/14/21 1300 04/27/21 1800 04/28/21 1500     Response to Exercise   Blood Pressure (Admit) 142/80 134/82 118/62 -- 124/82   Blood Pressure (Exercise) 194/96 142/80 120/62 -- --   Blood Pressure (Exit) 136/76 128/82 120/60 -- 134/72   Heart Rate (Admit) 63 bpm 64 bpm 68 bpm -- 66 bpm   Heart Rate (Exercise) 109 bpm 85 bpm 80 bpm -- 98 bpm   Heart Rate (Exit) 54 bpm 69 bpm 59 bpm -- 60 bpm   Oxygen Saturation (Admit) 97 % -- -- -- --   Oxygen Saturation (Exercise) 98 % -- -- -- --   Rating of Perceived Exertion (Exercise) 13 13 13  -- 13   Perceived Dyspnea (Exercise) 2 -- -- -- --   Symptoms SOB, fatigue SOB, fatigue none -- none   Comments walk test results first full day of exercise -- -- --   Duration -- Progress to 30 minutes of  aerobic without signs/symptoms of physical distress Continue with 30 min of aerobic exercise without signs/symptoms of physical distress. -- Continue with 30 min of aerobic exercise without signs/symptoms of physical distress.   Intensity -- THRR unchanged THRR unchanged -- THRR unchanged     Progression   Progression -- Continue to  progress workloads to maintain intensity without signs/symptoms of physical distress. Continue to progress workloads to maintain intensity without signs/symptoms of physical distress. -- Continue to progress workloads to maintain intensity without signs/symptoms of physical distress.   Average METs -- 2.2 2.55 -- 2.8     Resistance Training   Training Prescription -- Yes Yes -- Yes   Weight -- 3 lb 3 lb -- 4 lb   Reps -- 10-15 10-15 -- 10-15      Interval Training   Interval Training -- No No -- No     NuStep   Level -- 1 1 -- 3   Minutes -- 30 30 -- 30   METs -- 2.2 2.55 -- 2.8     Home Exercise Plan   Plans to continue exercise at -- -- -- Home (comment)  walking, Youtube videos Home (comment)  walking, Youtube videos   Frequency -- -- -- Add 2 additional days to program exercise sessions.  Start with 1 day Add 2 additional days to program exercise sessions.  Start with 1 day   Initial Home Exercises Provided -- -- -- 04/27/21 04/27/21    Row Name 05/14/21 0800 05/27/21 1500 07/06/21 1100 07/20/21 1500       Response to Exercise   Blood Pressure (Admit) 118/60 136/72 104/60 138/68    Blood Pressure (Exit) 130/66 102/58 108/64 140/64    Heart Rate (Admit) 55 bpm 68 bpm 52 bpm 59 bpm    Heart Rate (Exercise) 70 bpm 78 bpm 81 bpm 79 bpm    Heart Rate (Exit) 59 bpm 58 bpm 61 bpm 55 bpm    Rating of Perceived Exertion (Exercise) 13 13 12 13     Symptoms none none none none    Duration Continue with 30 min of aerobic exercise without signs/symptoms of physical distress. Continue with 30 min of aerobic exercise without signs/symptoms of physical distress. Continue with 30 min of aerobic exercise without signs/symptoms of physical distress. Continue with 30 min of aerobic exercise without signs/symptoms of physical distress.    Intensity THRR unchanged THRR unchanged THRR unchanged THRR unchanged         Progression   Progression Continue to progress workloads to maintain intensity without signs/symptoms of physical distress. Continue to progress workloads to maintain intensity without signs/symptoms of physical distress. Continue to progress workloads to maintain intensity without signs/symptoms of physical distress. Continue to progress workloads to maintain intensity without signs/symptoms of physical distress.    Average METs 2.65 2.8 2.6 2.7         Resistance Training   Training Prescription Yes Yes Yes Yes    Weight 4  lb 4 lb 4 lb 4 lb    Reps 10-15 10-15 10-15 10-15         Interval Training   Interval Training No No No No         NuStep   Level 4 3 4 3     Minutes 30 30 30 30     METs 3.1 2.8 2.6 2.8         REL-XR   Level -- -- -- 2    Minutes -- -- -- 30    METs -- -- -- 1.8         Home Exercise Plan   Plans to continue exercise at Home (comment)  walking, Youtube videos Home (comment)  walking, Youtube videos Home (comment)  walking, Youtube videos Home (comment)  walking, Youtube videos    Frequency Add 2  additional days to program exercise sessions.  Start with 1 day Add 2 additional days to program exercise sessions.  Start with 1 day Add 2 additional days to program exercise sessions.  Start with 1 day Add 2 additional days to program exercise sessions.  Start with 1 day    Initial Home Exercises Provided 04/27/21 04/27/21 04/27/21 04/27/21         Oxygen   Maintain Oxygen Saturation -- -- -- 88% or higher             Exercise Comments:   Exercise Comments     Row Name 07/29/21 1740           Exercise Comments Terik graduated today early from  rehab with 32 sessions completed. Dinari did not complete his exit walk test due to increased knee pain. Details of the patient's exercise prescription and what he needs to do in order to continue the prescription and progress were discussed with patient.  Patient was given a copy of prescription and goals.  Patient verbalized understanding.  Dawson plans to continue to exercise by walking at home.                Exercise Goals and Review:   Exercise Goals     Row Name 03/24/21 1147             Exercise Goals   Increase Physical Activity Yes       Intervention Provide advice, education, support and counseling about physical activity/exercise needs.;Develop an individualized exercise prescription for aerobic and resistive training based on initial evaluation findings, risk stratification, comorbidities and participant's  personal goals.       Expected Outcomes Short Term: Attend rehab on a regular basis to increase amount of physical activity.;Long Term: Add in home exercise to make exercise part of routine and to increase amount of physical activity.;Long Term: Exercising regularly at least 3-5 days a week.       Increase Strength and Stamina Yes       Intervention Provide advice, education, support and counseling about physical activity/exercise needs.;Develop an individualized exercise prescription for aerobic and resistive training based on initial evaluation findings, risk stratification, comorbidities and participant's personal goals.       Expected Outcomes Short Term: Increase workloads from initial exercise prescription for resistance, speed, and METs.;Short Term: Perform resistance training exercises routinely during rehab and add in resistance training at home;Long Term: Improve cardiorespiratory fitness, muscular endurance and strength as measured by increased METs and functional capacity (6MWT)       Able to understand and use rate of perceived exertion (RPE) scale Yes       Intervention Provide education and explanation on how to use RPE scale       Expected Outcomes Short Term: Able to use RPE daily in rehab to express subjective intensity level;Long Term:  Able to use RPE to guide intensity level when exercising independently       Able to understand and use Dyspnea scale Yes       Intervention Provide education and explanation on how to use Dyspnea scale       Expected Outcomes Short Term: Able to use Dyspnea scale daily in rehab to express subjective sense of shortness of breath during exertion;Long Term: Able to use Dyspnea scale to guide intensity level when exercising independently       Knowledge and understanding of Target Heart Rate Range (THRR) Yes       Intervention Provide education  and explanation of THRR including how the numbers were predicted and where they are located for reference        Expected Outcomes Short Term: Able to state/look up THRR;Long Term: Able to use THRR to govern intensity when exercising independently;Short Term: Able to use daily as guideline for intensity in rehab       Able to check pulse independently Yes       Intervention Provide education and demonstration on how to check pulse in carotid and radial arteries.;Review the importance of being able to check your own pulse for safety during independent exercise       Expected Outcomes Short Term: Able to explain why pulse checking is important during independent exercise;Long Term: Able to check pulse independently and accurately       Understanding of Exercise Prescription Yes       Intervention Provide education, explanation, and written materials on patient's individual exercise prescription       Expected Outcomes Short Term: Able to explain program exercise prescription;Long Term: Able to explain home exercise prescription to exercise independently                Exercise Goals Re-Evaluation :  Exercise Goals Re-Evaluation     Row Name 03/30/21 1745 04/14/21 1305 04/27/21 1803 05/14/21 0847 05/18/21 1730     Exercise Goal Re-Evaluation   Exercise Goals Review Increase Physical Activity;Able to understand and use rate of perceived exertion (RPE) scale;Knowledge and understanding of Target Heart Rate Range (THRR);Understanding of Exercise Prescription;Increase Strength and Stamina;Able to check pulse independently Increase Physical Activity;Increase Strength and Stamina Increase Physical Activity;Increase Strength and Stamina Increase Physical Activity;Increase Strength and Stamina Increase Physical Activity;Increase Strength and Stamina;Understanding of Exercise Prescription   Comments Reviewed RPE and dyspnea scales, THR and program prescription with pt today.  Pt voiced understanding and was given a copy of goals to take home. Nijee can do 30 min on NS.  Staff will encourage trying to do some laps on  track and increasing level on NS. Reviewed home exercise with pt today.  Pt plans to walk and complete Youtube staff videos for exercise.  Reviewed THR, pulse, RPE, sign and symptoms, pulse oximetery and when to call 911 or MD.  Also discussed weather considerations and indoor options.  Pt voiced understanding. Juvenal is doing well in rehab. He has physical limitations that do not allow him to walk but has increased his level on the T4 to level 4. Will continue to increase load and monitor progress. Dawood reports that he feels like he is getting stronger with his exercise and has progressed on his T4 NS level since starting the program. While he is limitied with walking he does try to do some walking with breaks as needed at home and is doing strength and stretching exercises at home on off days of rehab, following the home exercise guidelines provided by staff.   Expected Outcomes Short: Use RPE daily to regulate intensity. Long: Follow program prescription in THR. Short: try track/increase level on NS Long: improve overall stamina Short: Add on 1 day of exercise at home and monitor HR Long: Continue to exercise at home independently at appropriate exercise prescription Short: Continue building up tolerance on T4 Long: Continue to increase overall strength and stamina Short: continue to exercise at home and progress with intensity levels on T4 in cardiac rehab class. Long: become independent with exercise program and continue with exercise at home once he is discharged from program.  [place] Name 05/27/21 1507 06/24/21 1044 07/06/21 1150 07/20/21 1542       Exercise Goal Re-Evaluation   Exercise Goals Review Increase Physical Activity;Increase Strength and Stamina;Understanding of Exercise Prescription Increase Physical Activity;Increase Strength and Stamina;Understanding of Exercise Prescription Increase Physical Activity;Increase Strength and Stamina Increase Physical Activity;Increase Strength and  Stamina;Understanding of Exercise Prescription    Comments Izzy is doing well in rehab.  He is on level 3 for 30 min on the NuStep.  We will continue to encourage him to try walking more. We will continue to montior his progress. Coely has not attend rehab for several weeks as he has been out sick. COVID test was negative. Per patient, he feels better and plans to be back to rehab on 8/22. Amelia is back and was able to resume where he left off.  Staff will monitor progress. Alecxander is nearing graduation and will be doing his post 6MWT soon.  We expect to see an improvement in his distance.  He continues to just stay on equipment and not walk while in rehab as he walks at home.  We will continue to monitor his progress.    Expected Outcomes Short: Walk some in rehab.  Long: Continue to improve stamina Short: Attend rehab consistently again Long: Continue to increase overall strength and endurance Short: get back to consistent attendance Long:complete HT program Short: impove post 6WMT Long: Continue to improve stamina             Discharge Exercise Prescription (Final Exercise Prescription Changes):  Exercise Prescription Changes - 07/20/21 1500       Response to Exercise   Blood Pressure (Admit) 138/68    Blood Pressure (Exit) 140/64    Heart Rate (Admit) 59 bpm    Heart Rate (Exercise) 79 bpm    Heart Rate (Exit) 55 bpm    Rating of Perceived Exertion (Exercise) 13    Symptoms none    Duration Continue with 30 min of aerobic exercise without signs/symptoms of physical distress.    Intensity THRR unchanged      Progression   Progression Continue to progress workloads to maintain intensity without signs/symptoms of physical distress.    Average METs 2.7      Resistance Training   Training Prescription Yes    Weight 4 lb    Reps 10-15      Interval Training   Interval Training No      NuStep   Level 3    Minutes 30    METs 2.8      REL-XR   Level 2    Minutes 30    METs 1.8       Home Exercise Plan   Plans to continue exercise at Home (comment)   walking, Youtube videos   Frequency Add 2 additional days to program exercise sessions.   Start with 1 day   Initial Home Exercises Provided 04/27/21      Oxygen   Maintain Oxygen Saturation 88% or higher             Nutrition:  Target Goals: Understanding of nutrition guidelines, daily intake of sodium 1500mg , cholesterol 200mg , calories 30% from fat and 7% or less from saturated fats, daily to have 5 or more servings of fruits and vegetables.  Education: All About Nutrition: -Group instruction provided by verbal, written material, interactive activities, discussions, models, and posters to present general guidelines for heart healthy nutrition including fat, fiber, MyPlate, the role of sodium in heart healthy  nutrition, utilization of the nutrition label, and utilization of this knowledge for meal planning. Follow up email sent as well. Written material given at graduation. Flowsheet Row Cardiac Rehab from 04/15/2021 in [facility] Cardiac and Pulmonary Rehab  Education need identified 03/24/21       Biometrics:  Pre Biometrics - 03/24/21 1244       Pre Biometrics   Height 5' 8.6" (1.742 m)    Weight 228 lb 1.6 oz (103.5 kg)    BMI (Calculated) 34.1    Single Leg Stand 1.2 seconds              Nutrition Therapy Plan and Nutrition Goals:  Nutrition Therapy & Goals - 05/25/21 1721       Nutrition Therapy   Diet Heart healthy, low Na    Drug/Food Interactions Statins/Certain Fruits    Protein (specify units) 80g    Fiber 30 grams    Whole Grain Foods 3 servings    Saturated Fats 12 max. grams    Fruits and Vegetables 8 servings/day    Sodium 1.5 grams      Personal Nutrition Goals   Nutrition Goal ST: add fruit to breakfast - he is thinking a banana, add veggies to lunch LT: maintain heart healthy changes    Comments Uses olive oil. B: oatmeal with pecans L: peanut butter sandwich on whole  wheat D: wife cooks dinner. She will cook greens for dinner, skinless chicken leg or [place] wings. She uses some sea salt.  Drinks: water. Discussed heart healthy eating, Burman seems to have a balanced diet (he doesn't eat many carbohydrates for dinner - he coiuld include some starchy vegetables or whole grains or beans), he enjoys what he is eating and he is satisfied during the day. Mohmed reports his weight has been stable.      Intervention Plan   Intervention Prescribe, educate and counsel regarding individualized specific dietary modifications aiming towards targeted core components such as weight, hypertension, lipid management, diabetes, heart failure and other comorbidities.;Nutrition handout(s) given to patient.    Expected Outcomes Short Term Goal: Understand basic principles of dietary content, such as calories, fat, sodium, cholesterol and nutrients.;Short Term Goal: A plan has been developed with personal nutrition goals set during dietitian appointment.;Long Term Goal: Adherence to prescribed nutrition plan.             Nutrition Assessments:  MEDIFICTS Score Key: ?70 Need to make dietary changes  40-70 Heart Healthy Diet ? 40 Therapeutic Level Cholesterol Diet  Flowsheet Row Cardiac Rehab from 03/24/2021 in [facility] Cardiac and Pulmonary Rehab  Picture Your Plate Total Score on Admission 80      Picture Your Plate Scores: <26 Unhealthy dietary pattern with much room for improvement. 41-50 Dietary pattern unlikely to meet recommendations for good health and room for improvement. 51-60 More healthful dietary pattern, with some room for improvement.  >60 Healthy dietary pattern, although there may be some specific behaviors that could be improved.    Nutrition Goals Re-Evaluation:  Nutrition Goals Re-Evaluation     [place] Name 04/16/21 1742 05/18/21 1739 07/02/21 1727         Goals   Current Weight 228 lb (103.4 kg) -- 222 lb (100.7 kg)     Nutrition Goal Possible snacking  or eating a little more. -- Lose more weight     Comment Arling states that he is meeting with the dietician on 04/27/2021. He is not sure if he is eating enough food. He wants  to lose some weight. He feels like he has some more energy since he started but would like to have more. Informed him to make a 3 day log of what he is eating to get an idea of his nutrition habits. Camari has missed several appointments with program dietician. He has been encouraged to reschedule his appointment in order to set specific nutrition goals. Sanjay has been out due to [place]. He states he is not over eating and is eating more vegetables and fruit. He also is eating more oatmeal for breakfast.Irie wants to reach a weight goal of 215 pounds.     Expected Outcome Short: meet with the dietician. Long: maintain a healthy diet that pertains to his needs. Short: meet with the dietician. Long: maintain a healthy diet that pertains to his needs. Short: lost 5 pounds in the next couple weeks. Long: reach a weight goal of 215 pounds.              Nutrition Goals Discharge (Final Nutrition Goals Re-Evaluation):  Nutrition Goals Re-Evaluation - 07/02/21 1727       Goals   Current Weight 222 lb (100.7 kg)    Nutrition Goal Lose more weight    Comment Cheron has been out due to [place]. He states he is not over eating and is eating more vegetables and fruit. He also is eating more oatmeal for breakfast.Jhostin wants to reach a weight goal of 215 pounds.    Expected Outcome Short: lost 5 pounds in the next couple weeks. Long: reach a weight goal of 215 pounds.             Psychosocial: Target Goals: Acknowledge presence or absence of significant depression and/or stress, maximize coping skills, provide positive support system. Participant is able to verbalize types and ability to use techniques and skills needed for reducing stress and depression.   Education: Stress, Anxiety, and Depression - Group verbal and visual  presentation to define topics covered.  Reviews how body is impacted by stress, anxiety, and depression.  Also discusses healthy ways to reduce stress and to treat/manage anxiety and depression.  Written material given at graduation. Flowsheet Row Cardiac Rehab from 04/15/2021 in [facility] Cardiac and Pulmonary Rehab  Date 04/15/21  Educator AS  Instruction Review Code 1- Verbalizes Understanding       Education: Sleep Hygiene -Provides group verbal and written instruction about how sleep can affect your health.  Define sleep hygiene, discuss sleep cycles and impact of sleep habits. Review good sleep hygiene tips.    Initial Review & Psychosocial Screening:  Initial Psych Review & Screening - 03/18/21 1039       Initial Review   Current issues with None Identified      Family Dynamics   Good Support System? Yes   family     Barriers   Psychosocial barriers to participate in program There are no identifiable barriers or psychosocial needs.;The patient should benefit from training in stress management and relaxation.      Screening Interventions   Interventions Encouraged to exercise;Provide feedback about the scores to participant;To provide support and resources with identified psychosocial needs    Expected Outcomes Short Term goal: Utilizing psychosocial counselor, staff and physician to assist with identification of specific Stressors or current issues interfering with healing process. Setting desired goal for each stressor or current issue identified.;Long Term Goal: Stressors or current issues are controlled or eliminated.;Short Term goal: Identification and review with participant of any Quality of Life or  Depression concerns found by scoring the questionnaire.;Long Term goal: The participant improves quality of Life and PHQ9 Scores as seen by post scores and/or verbalization of changes             Quality of Life Scores:   Quality of Life - 03/24/21 1245       Quality of Life    Select Quality of Life      Quality of Life Scores   Health/Function Pre 26.26 %    Socioeconomic Pre 29.5 %    Psych/Spiritual Pre 28.8 %    Family Pre 27.6 %    GLOBAL Pre 27.78 %            Scores of 19 and below usually indicate a poorer quality of life in these areas.  A difference of  2-3 points is a clinically meaningful difference.  A difference of 2-3 points in the total score of the Quality of Life Index has been associated with significant improvement in overall quality of life, self-image, physical symptoms, and general health in studies assessing change in quality of life.  PHQ-9: Recent Review Flowsheet Data     Depression screen [facility] 2/9 03/24/2021   Decreased Interest 0   Down, Depressed, Hopeless 0   PHQ - 2 Score 0   Altered sleeping 0   Tired, decreased energy 0   Change in appetite 0   Feeling bad or failure about yourself  0   Trouble concentrating 0   Moving slowly or fidgety/restless 0   Suicidal thoughts 0   PHQ-9 Score 0   Difficult doing work/chores Not difficult at all      Interpretation of Total Score  Total Score Depression Severity:  1-4 = Minimal depression, 5-9 = Mild depression, 10-14 = Moderate depression, 15-19 = Moderately severe depression, 20-27 = Severe depression   Psychosocial Evaluation and Intervention:  Psychosocial Evaluation - 03/18/21 1053       Psychosocial Evaluation & Interventions   Interventions Encouraged to exercise with the program and follow exercise prescription    Comments 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 He said he is eating and sleeping well. He is still doing what he wants to do during the day. He does have a copay which will most likely hinder the length of his attendance. While he is in the program he wants to work on his diabetes and maintaining his independence.    Expected Outcomes Short: attend cardiac rehab for education and exercise. Long: develop positive self  care habits.    Continue Psychosocial Services  Follow up required by staff             Psychosocial Re-Evaluation:  Psychosocial Re-Evaluation     [place] Name 04/16/21 1745 05/18/21 1729 07/02/21 1724         Psychosocial Re-Evaluation   Current issues with None Identified None Identified None Identified     Comments Patient reports no issues with their current mental states, sleep, stress, depression or anxiety. Will follow up with patient in a few weeks for any changes. Cristin has 5 kids and 22 grandkids. He sees his grandkids often. Patient reports no issues with their current mental states, sleep, stress, depression or anxiety. Will follow up with patient in a few weeks for any changes. Patient reports that he has a strong family support system. Patient reports no issues with their current mental states, sleep, stress, depression or anxiety. Will follow up with patient  in a few weeks for any changes.     Expected Outcomes Short: Continue to exercise regularly to support mental health and notify staff of any changes. Long: maintain mental health and well being through teaching of rehab or prescribed medications independently. Short: Continue to exercise regularly to support mental health and notify staff of any changes. Long: maintain mental health and well being through teaching of rehab or prescribed medications independently. Short: Continue to exercise regularly to support mental health and notify staff of any changes. Long: maintain mental health and well being through teaching of rehab or prescribed medications independently.     Interventions Encouraged to attend Cardiac Rehabilitation for the exercise Encouraged to attend Cardiac Rehabilitation for the exercise Encouraged to attend Cardiac Rehabilitation for the exercise     Continue Psychosocial Services  Follow up required by staff Follow up required by staff Follow up required by staff              Psychosocial Discharge  (Final Psychosocial Re-Evaluation):  Psychosocial Re-Evaluation - 07/02/21 1724       Psychosocial Re-Evaluation   Current issues with None Identified    Comments Patient reports no issues with their current mental states, sleep, stress, depression or anxiety. Will follow up with patient in a few weeks for any changes.    Expected Outcomes Short: Continue to exercise regularly to support mental health and notify staff of any changes. Long: maintain mental health and well being through teaching of rehab or prescribed medications independently.    Interventions Encouraged to attend Cardiac Rehabilitation for the exercise    Continue Psychosocial Services  Follow up required by staff             Vocational Rehabilitation: Provide vocational rehab assistance to qualifying candidates.   Vocational Rehab Evaluation & Intervention:  Vocational Rehab - 03/18/21 1053       Initial Vocational Rehab Evaluation & Intervention   Assessment shows need for Vocational Rehabilitation No             Education: Education Goals: Education classes will be provided on a variety of topics geared toward better understanding of heart health and risk factor modification. Participant will state understanding/return demonstration of topics presented as noted by education test scores.  Learning Barriers/Preferences:  Learning Barriers/Preferences - 03/18/21 1040       Learning Barriers/Preferences   Learning Barriers None    Learning Preferences None             General Cardiac Education Topics:  AED/CPR: - Group verbal and written instruction with the use of models to demonstrate the basic use of the AED with the basic ABC's of resuscitation.   Anatomy and Cardiac Procedures: - Group verbal and visual presentation and models provide information about basic cardiac anatomy and function. Reviews the testing methods done to diagnose heart disease and the outcomes of the test results.  Describes the treatment choices: Medical Management, Angioplasty, or Coronary Bypass Surgery for treating various heart conditions including Myocardial Infarction, Angina, Valve Disease, and Cardiac Arrhythmias.  Written material given at graduation. Flowsheet Row Cardiac Rehab from 04/15/2021 in [facility] Cardiac and Pulmonary Rehab  Education need identified 03/24/21       Medication Safety: - Group verbal and visual instruction to review commonly prescribed medications for heart and lung disease. Reviews the medication, class of the drug, and side effects. Includes the steps to properly store meds and maintain the prescription regimen.  Written material given at graduation.  Intimacy: - Group verbal instruction through game format to discuss how heart and lung disease can affect sexual intimacy. Written material given at graduation..   Know Your Numbers and Heart Failure: - Group verbal and visual instruction to discuss disease risk factors for cardiac and pulmonary disease and treatment options.  Reviews associated critical values for Overweight/Obesity, Hypertension, Cholesterol, and Diabetes.  Discusses basics of heart failure: signs/symptoms and treatments.  Introduces Heart Failure Zone chart for action plan for heart failure.  Written material given at graduation. Flowsheet Row Cardiac Rehab from 04/15/2021 in [facility] Cardiac and Pulmonary Rehab  Date 04/01/21  Educator [facility]  Instruction Review Code 1- Verbalizes Understanding       Infection Prevention: - Provides verbal and written material to individual with discussion of infection control including proper hand washing and proper equipment cleaning during exercise session. Flowsheet Row Cardiac Rehab from 04/15/2021 in [facility] Cardiac and Pulmonary Rehab  Date 03/24/21  Educator [facility]  Instruction Review Code 1- Verbalizes Understanding       Falls Prevention: - Provides verbal and written material to individual with discussion of falls  prevention and safety. Flowsheet Row Cardiac Rehab from 04/15/2021 in [facility] Cardiac and Pulmonary Rehab  Date 03/24/21  Educator [facility]  Instruction Review Code 1- Verbalizes Understanding       Other: -Provides group and verbal instruction on various topics (see comments)   Knowledge Questionnaire Score:  Knowledge Questionnaire Score - 03/24/21 1246       Knowledge Questionnaire Score   Pre Score 19/26 Education Focus: nutrition, exercise, angina, cessation             Core Components/Risk Factors/Patient Goals at Admission:  Personal Goals and Risk Factors at Admission - 03/24/21 1246       Core Components/Risk Factors/Patient Goals on Admission    Weight Management Yes;Obesity;Weight Loss    Intervention Weight Management: Develop a combined nutrition and exercise program designed to reach desired caloric intake, while maintaining appropriate intake of nutrient and fiber, sodium and fats, and appropriate energy expenditure required for the weight goal.;Weight Management: Provide education and appropriate resources to help participant work on and attain dietary goals.;Weight Management/Obesity: Establish reasonable short term and long term weight goals.;Obesity: Provide education and appropriate resources to help participant work on and attain dietary goals.    Admit Weight 228 lb 1.6 oz (103.5 kg)    Goal Weight: Short Term 223 lb (101.2 kg)    Goal Weight: Long Term 220 lb (99.8 kg)    Expected Outcomes Short Term: Continue to assess and modify interventions until short term weight is achieved;Long Term: Adherence to nutrition and physical activity/exercise program aimed toward attainment of established weight goal;Weight Loss: Understanding of general recommendations for a balanced deficit meal plan, which promotes 1-2 lb weight loss per week and includes a negative energy balance of [phone] kcal/d;Understanding recommendations for meals to include 15-35% energy as protein, 25-35%  energy from fat, 35-60% energy from carbohydrates, less than 200mg  of dietary cholesterol, 20-35 gm of total fiber daily;Understanding of distribution of calorie intake throughout the day with the consumption of 4-5 meals/snacks    Diabetes Yes    Intervention Provide education about signs/symptoms and action to take for hypo/hyperglycemia.;Provide education about proper nutrition, including hydration, and aerobic/resistive exercise prescription along with prescribed medications to achieve blood glucose in normal ranges: Fasting glucose 65-99 mg/dL    Expected Outcomes Short Term: Participant verbalizes understanding of the signs/symptoms and immediate care of hyper/hypoglycemia, proper foot care and  importance of medication, aerobic/resistive exercise and nutrition plan for blood glucose control.;Long Term: Attainment of HbA1C < 7%.    Hypertension Yes    Intervention Provide education on lifestyle modifcations including regular physical activity/exercise, weight management, moderate sodium restriction and increased consumption of fresh fruit, vegetables, and low fat dairy, alcohol moderation, and smoking cessation.;Monitor prescription use compliance.    Expected Outcomes Short Term: Continued assessment and intervention until BP is < 140/52mm HG in hypertensive participants. < 130/56mm HG in hypertensive participants with diabetes, heart failure or chronic kidney disease.;Long Term: Maintenance of blood pressure at goal levels.    Lipids Yes    Intervention Provide education and support for participant on nutrition & aerobic/resistive exercise along with prescribed medications to achieve LDL 70mg , HDL >40mg .    Expected Outcomes Short Term: Participant states understanding of desired cholesterol values and is compliant with medications prescribed. Participant is following exercise prescription and nutrition guidelines.;Long Term: Cholesterol controlled with medications as prescribed, with individualized  exercise RX and with personalized nutrition plan. Value goals: LDL < 70mg , HDL > 40 mg.             Education:Diabetes - Individual verbal and written instruction to review signs/symptoms of diabetes, desired ranges of glucose level fasting, after meals and with exercise. Acknowledge that pre and post exercise glucose checks will be done for 3 sessions at entry of program. [facility] from 04/15/2021 in [facility] Cardiac and Pulmonary Rehab  Date 03/18/21  Educator [facility]  Instruction Review Code 1- Verbalizes Understanding       Core Components/Risk Factors/Patient Goals Review:   Goals and Risk Factor Review     Row Name 04/16/21 1739 05/18/21 1734 07/02/21 1725         Core Components/Risk Factors/Patient Goals Review   Personal Goals Review Weight Management/Obesity;Diabetes Weight Management/Obesity;Diabetes Weight Management/Obesity     Review Markice states that his blood sugars have been good at home between 120 and 130 fasting. He is still trying to lose a couple pounds. He wants to reach a weight goal of 200 pounds. Informed patient that he needs to figure out if what he is eating is going to help him lose weight. Cuauhtemoc reports that he continues to check his blood sugars at home and they remain withing acceptable ranges. He stated that he has not seen too much of a change in in his weight since starting the program. He has been consistent with exercise but was encouraged to reschedule his dietician appointment that was missed in order to work on nutrician goals. Dorse wants to work on weight loss. He has been out of Rehab due to getting COVID. He is going to try to work on his weight loss with diet and exercise. Now that he is able to exercise again his weight may improve.     Expected Outcomes Short: lose some weight. Long: lose 10 pounds in the program. Short: meet with dietican to to discuss nutition aspect with reguards to weight loss. Long: lose 10 pounsd in the program.  Short: lose more weight. Long: reach patients weight goal.              Core Components/Risk Factors/Patient Goals at Discharge (Final Review):   Goals and Risk Factor Review - 07/02/21 1725       Core Components/Risk Factors/Patient Goals Review   Personal Goals Review Weight Management/Obesity    Review Berlyn wants to work on weight loss. He has been out of Rehab due to  getting COVID. He is going to try to work on his weight loss with diet and exercise. Now that he is able to exercise again his weight may improve.    Expected Outcomes Short: lose more weight. Long: reach patients weight goal.             ITP Comments:  ITP Comments     Row Name 03/18/21 1059 03/24/21 1123 03/25/21 0719 03/30/21 1745 04/22/21 0747   ITP Comments Initial telephone orientation completed. Diagnosis can be found in [facility] 4/22. EP orientation scheduled for Tuesday 5/17 at 10am. Completed 6MWT and gym orientation. Initial ITP created and sent for review to Dr. Emily Filbert, Medical Director. 30 Day review completed. Medical Director ITP review done, changes made as directed, and signed approval by Medical Director.   New to program First full day of exercise!  Patient was oriented to gym and equipment including functions, settings, policies, and procedures.  Patient's individual exercise prescription and treatment plan were reviewed.  All starting workloads were established based on the results of the 6 minute walk test done at initial orientation visit.  The plan for exercise progression was also introduced and progression will be customized based on patient's performance and goals 30 Day review completed. Medical Director ITP review done, changes made as directed, and signed approval by Medical Director.    [place] Name 05/20/21 0840 06/10/21 1143 06/17/21 0727 06/24/21 1040 07/15/21 0627   ITP Comments 30 Day review completed. Medical Director ITP review done, changes made as directed, and signed approval by  Medical Director. [place] has not attended since last review 30 Day review completed. Medical Director ITP review done, changes made as directed, and signed approval by Medical Director. Erick has not attended rehab recently due to feeling sick. He tested negative for COVID. Staff reached out to him today who stated he will plan to be back next Monday, 8/22. 30 Day review completed. Medical Director ITP review done, changes made as directed, and signed approval by Medical Director.    Row Name 07/29/21 1740           ITP Comments Ahyan graduated today early from  rehab with 32 sessions completed. Amardeep did not complete his exit walk test due to increased knee pain. Details of the patient's exercise prescription and what he needs to do in order to continue the prescription and progress were discussed with patient.  Patient was given a copy of prescription and goals.  Patient verbalized understanding.  Efrem plans to continue to exercise by walking at home.                Comments: discharge ITP

## 2021-07-29 NOTE — Progress Notes (Signed)
Discharge Progress Report  Patient Details  Name: [name] MRN: [id number] Date of Birth: 18-Mar-1935 Referring Provider:   Flowsheet Row Cardiac Rehab from 03/24/2021 in [facility] Cardiac and Pulmonary Rehab  Referring Provider [name] MD        Number of Visits: 32  Reason for Discharge:  Patient reached a stable level of exercise. Patient independent in their exercise.  Smoking History:  Social History   Tobacco Use  Smoking Status Former   Types: Cigarettes   Quit date: 07/26/1996   Years since quitting: 25.0  Smokeless Tobacco Never    Diagnosis:  NSTEMI (non-ST elevation myocardial infarction) ([place])  S/P CABG x 5  ADL [facility]SD:   Initial Exercise Prescription:  Initial Exercise Prescription - 03/24/21 1100       Date of Initial Exercise RX and Referring Provider   Date 03/24/21    Referring Provider [name] MD      Recumbant Bike   Level 1    RPM 50    Watts 10    Minutes 15    METs 2      NuStep   Level 1    SPM 80    Minutes 15    METs 2      Track   Laps 20    Minutes 15    METs 2.1      Prescription Details   Frequency (times per week) 3    Duration Progress to 30 minutes of continuous aerobic without signs/symptoms of physical distress      Intensity   THRR 40-80% of Max Heartrate 93-122    Ratings of Perceived Exertion 11-13    Perceived Dyspnea 0-4      Progression   Progression Continue to progress workloads to maintain intensity without signs/symptoms of physical distress.      Resistance Training   Training Prescription Yes    Weight 3 lb    Reps 10-15             Discharge Exercise Prescription (Final Exercise Prescription Changes):  Exercise Prescription Changes - 07/20/21 1500       Response to Exercise   Blood Pressure (Admit) 138/68    Blood Pressure (Exit) 140/64    Heart Rate (Admit) 59 bpm    Heart Rate (Exercise) 79 bpm    Heart Rate (Exit) 55 bpm    Rating of Perceived Exertion  (Exercise) 13    Symptoms none    Duration Continue with 30 min of aerobic exercise without signs/symptoms of physical distress.    Intensity THRR unchanged      Progression   Progression Continue to progress workloads to maintain intensity without signs/symptoms of physical distress.    Average METs 2.7      Resistance Training   Training Prescription Yes    Weight 4 lb    Reps 10-15      Interval Training   Interval Training No      NuStep   Level 3    Minutes 30    METs 2.8      REL-XR   Level 2    Minutes 30    METs 1.8      Home Exercise Plan   Plans to continue exercise at Home (comment)   walking, Youtube videos   Frequency Add 2 additional days to program exercise sessions.   Start with 1 day   Initial Home Exercises Provided 04/27/21      Oxygen   Maintain  Oxygen Saturation 88% or higher             Functional Capacity:  6 Minute Walk     Row Name 03/24/21 1144         6 Minute Walk   Phase Initial     Distance 800 feet     Walk Time 6 minutes     # of Rest Breaks 0     MPH 1.52     METS 1.54     RPE 13     Perceived Dyspnea  2     VO2 Peak 5.39     Symptoms Yes (comment)     Comments SOB, fatigue     Resting HR 63 bpm     Resting BP 142/80     Resting Oxygen Saturation  97 %     Exercise Oxygen Saturation  during 6 min walk 98 %     Max Ex. HR 109 bpm     Max Ex. BP 194/96     2 Minute Post BP 150/82              Psychological, QOL, Others - Outcomes: PHQ 2/9: Depression screen PHQ 2/9 03/24/2021  Decreased Interest 0  Down, Depressed, Hopeless 0  PHQ - 2 Score 0  Altered sleeping 0  Tired, decreased energy 0  Change in appetite 0  Feeling bad or failure about yourself  0  Trouble concentrating 0  Moving slowly or fidgety/restless 0  Suicidal thoughts 0  PHQ-9 Score 0  Difficult doing work/chores Not difficult at all    Quality of Life:  Quality of Life - 03/24/21 1245       Quality of Life   Select Quality of  Life      Quality of Life Scores   Health/Function Pre 26.26 %    Socioeconomic Pre 29.5 %    Psych/Spiritual Pre 28.8 %    Family Pre 27.6 %    GLOBAL Pre 27.78 %              Nutrition Discharge:   Education Questionnaire Score:  Knowledge Questionnaire Score - 03/24/21 1246       Knowledge Questionnaire Score   Pre Score 19/26 Education Focus: nutrition, exercise, angina, cessation             Goals reviewed with patient; copy given to patient.

## 2021-07-29 NOTE — Progress Notes (Signed)
Daily Session Note  Patient Details  Name: [name] MRN: [id number] Date of Birth: [birth year] Referring Provider:   Flowsheet Row Cardiac Rehab from 03/24/2021 in [facility] Cardiac and Pulmonary Rehab  Referring Provider [name] MD       Encounter Date: 07/29/2021  Check In:  Session Check In - 07/29/21 1739       Check-In   Supervising physician immediately available to respond to emergencies See telemetry face sheet for immediately available ER MD    Location [facility]-Cardiac & Pulmonary Rehab    Staff Present Birdie Sons, MPA, Nino Glow, MS, ASCM CEP, Exercise Physiologist;Joseph Alcus Dad, RN BSN    Virtual Visit No    Medication changes reported     No    Fall or balance concerns reported    No    Tobacco Cessation No Change    Warm-up and Cool-down Performed on first and last piece of equipment    Resistance Training Performed Yes    VAD Patient? No    PAD/SET Patient? No      Pain Assessment   Currently in Pain? No/denies                Social History   Tobacco Use  Smoking Status Former   Types: Cigarettes   Quit date: 07/26/1996   Years since quitting: 25.0  Smokeless Tobacco Never    Goals Met:  Independence with exercise equipment Exercise tolerated well No report of concerns or symptoms today Strength training completed today  Goals Unmet:  Not Applicable  Comments:  Kush graduated today from  rehab with 32 sessions completed.  Details of the patient's exercise prescription and what He needs to do in order to continue the prescription and progress were discussed with patient.  Patient was given a copy of prescription and goals.  Patient verbalized understanding.  Spiro plans to continue to exercise by walking at home.    Dr. Emily Filbert is Medical Director for [place].  Dr. Ottie Glazier is Medical Director for [facility].

## 2021-07-29 NOTE — Patient Instructions (Signed)
Discharge Patient Instructions  Patient Details  Name: [name] MRN: [id number] Date of Birth: 05/16/1935 Referring Provider:  [name], MD   Number of Visits: 9  Reason for Discharge:  Patient reached a stable level of exercise. Patient independent in their exercise.  Smoking History:  Social History   Tobacco Use  Smoking Status Former   Types: Cigarettes   Quit date: 07/26/1996   Years since quitting: 25.0  Smokeless Tobacco Never    Diagnosis:  NSTEMI (non-ST elevation myocardial infarction) ([place])  S/P CABG x 5  Initial Exercise Prescription:  Initial Exercise Prescription - 03/24/21 1100       Date of Initial Exercise RX and Referring Provider   Date 03/24/21    Referring Provider Duane Sacramento MD      Recumbant Bike   Level 1    RPM 50    Watts 10    Minutes 15    METs 2      NuStep   Level 1    SPM 80    Minutes 15    METs 2      Track   Laps 20    Minutes 15    METs 2.1      Prescription Details   Frequency (times per week) 3    Duration Progress to 30 minutes of continuous aerobic without signs/symptoms of physical distress      Intensity   THRR 40-80% of Max Heartrate 93-122    Ratings of Perceived Exertion 11-13    Perceived Dyspnea 0-4      Progression   Progression Continue to progress workloads to maintain intensity without signs/symptoms of physical distress.      Resistance Training   Training Prescription Yes    Weight 3 lb    Reps 10-15             Discharge Exercise Prescription (Final Exercise Prescription Changes):  Exercise Prescription Changes - 07/20/21 1500       Response to Exercise   Blood Pressure (Admit) 138/68    Blood Pressure (Exit) 140/64    Heart Rate (Admit) 59 bpm    Heart Rate (Exercise) 79 bpm    Heart Rate (Exit) 55 bpm    Rating of Perceived Exertion (Exercise) 13    Symptoms none    Duration Continue with 30 min of aerobic exercise without signs/symptoms of physical  distress.    Intensity THRR unchanged      Progression   Progression Continue to progress workloads to maintain intensity without signs/symptoms of physical distress.    Average METs 2.7      Resistance Training   Training Prescription Yes    Weight 4 lb    Reps 10-15      Interval Training   Interval Training No      NuStep   Level 3    Minutes 30    METs 2.8      REL-XR   Level 2    Minutes 30    METs 1.8      Home Exercise Plan   Plans to continue exercise at Home (comment)   walking, Youtube videos   Frequency Add 2 additional days to program exercise sessions.   Start with 1 day   Initial Home Exercises Provided 04/27/21      Oxygen   Maintain Oxygen Saturation 88% or higher             Functional Capacity:  6 Minute Walk  Row Name 03/24/21 1144         6 Minute Walk   Phase Initial     Distance 800 feet     Walk Time 6 minutes     # of Rest Breaks 0     MPH 1.52     METS 1.54     RPE 13     Perceived Dyspnea  2     VO2 Peak 5.39     Symptoms Yes (comment)     Comments SOB, fatigue     Resting HR 63 bpm     Resting BP 142/80     Resting Oxygen Saturation  97 %     Exercise Oxygen Saturation  during 6 min walk 98 %     Max Ex. HR 109 bpm     Max Ex. BP 194/96     2 Minute Post BP 150/82              Quality of Life:  Quality of Life - 03/24/21 1245       Quality of Life   Select Quality of Life      Quality of Life Scores   Health/Function Pre 26.26 %    Socioeconomic Pre 29.5 %    Psych/Spiritual Pre 28.8 %    Family Pre 27.6 %    GLOBAL Pre 27.78 %              Nutrition & Weight - Outcomes:  Pre Biometrics - 03/24/21 1244       Pre Biometrics   Height 5' 8.6" (1.742 m)    Weight 228 lb 1.6 oz (103.5 kg)    BMI (Calculated) 34.1    Single Leg Stand 1.2 seconds             Nutrition:  Nutrition Therapy & Goals - 05/25/21 1721       Nutrition Therapy   Diet Heart healthy, low Na    Drug/Food  Interactions Statins/Certain Fruits    Protein (specify units) 80g    Fiber 30 grams    Whole Grain Foods 3 servings    Saturated Fats 12 max. grams    Fruits and Vegetables 8 servings/day    Sodium 1.5 grams      Personal Nutrition Goals   Nutrition Goal ST: add fruit to breakfast - he is thinking a banana, add veggies to lunch LT: maintain heart healthy changes    Comments Uses olive oil. B: oatmeal with pecans L: peanut butter sandwich on whole wheat D: wife cooks dinner. She will cook greens for dinner, skinless chicken leg or [place] wings. She uses some sea salt.  Drinks: water. Discussed heart healthy eating, [name] seems to have a balanced diet (he doesn't eat many carbohydrates for dinner - he coiuld include some starchy vegetables or whole grains or beans), he enjoys what he is eating and he is satisfied during the day. [name] reports his weight has been stable.      Intervention Plan   Intervention Prescribe, educate and counsel regarding individualized specific dietary modifications aiming towards targeted core components such as weight, hypertension, lipid management, diabetes, heart failure and other comorbidities.;Nutrition handout(s) given to patient.    Expected Outcomes Short Term Goal: Understand basic principles of dietary content, such as calories, fat, sodium, cholesterol and nutrients.;Short Term Goal: A plan has been developed with personal nutrition goals set during dietitian appointment.;Long Term Goal: Adherence to prescribed nutrition plan.  Education Questionnaire Score:  Knowledge Questionnaire Score - 03/24/21 1246       Knowledge Questionnaire Score   Pre Score 19/26 Education Focus: nutrition, exercise, angina, cessation             Goals reviewed with patient; copy given to patient.

## 2021-07-31 ENCOUNTER — Encounter: Payer: Self-pay | Admitting: Nurse Practitioner

## 2021-07-31 ENCOUNTER — Ambulatory Visit (INDEPENDENT_AMBULATORY_CARE_PROVIDER_SITE_OTHER): Payer: Medicare HMO | Admitting: Nurse Practitioner

## 2021-07-31 ENCOUNTER — Other Ambulatory Visit: Payer: Self-pay

## 2021-07-31 VITALS — BP 142/76 | HR 56 | Ht 68.0 in | Wt 236.6 lb

## 2021-07-31 DIAGNOSIS — E875 Hyperkalemia: Secondary | ICD-10-CM

## 2021-07-31 DIAGNOSIS — I255 Ischemic cardiomyopathy: Secondary | ICD-10-CM

## 2021-07-31 DIAGNOSIS — N183 Chronic kidney disease, stage 3 unspecified: Secondary | ICD-10-CM | POA: Diagnosis not present

## 2021-07-31 DIAGNOSIS — I2510 Atherosclerotic heart disease of native coronary artery without angina pectoris: Secondary | ICD-10-CM

## 2021-07-31 DIAGNOSIS — I10 Essential (primary) hypertension: Secondary | ICD-10-CM | POA: Diagnosis not present

## 2021-07-31 DIAGNOSIS — E785 Hyperlipidemia, unspecified: Secondary | ICD-10-CM

## 2021-07-31 DIAGNOSIS — I5043 Acute on chronic combined systolic (congestive) and diastolic (congestive) heart failure: Secondary | ICD-10-CM

## 2021-07-31 MED ORDER — FUROSEMIDE 80 MG PO TABS: 80.0000 mg | ORAL_TABLET | Freq: Two times a day (BID) | ORAL | 2 refills | Status: DC
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
## 2021-07-31 NOTE — Patient Instructions (Signed)
Medication Instructions:  Your physician has recommended you make the following change in your medication:   INCREASE Furosemide (lasix) 80 mg twice a day for 3 days then go back on Monday 08/03/21 to Furosamide to 80 mg once a day.   *If you need a refill on your cardiac medications before your next appointment, please call your pharmacy*   Lab Work: None  If you have labs (blood work) drawn today and your tests are completely normal, you will receive your results only by: [place] (if you have MyChart) OR A paper copy in the mail If you have any lab test that is abnormal or we need to change your treatment, we will call you to review the results.   Testing/Procedures: None   Follow-Up: At [facility], you and your health needs are our priority.  As part of our continuing mission to provide you with exceptional heart care, we have created designated Provider Care Teams.  These Care Teams include your primary Cardiologist (physician) and Advanced Practice Providers (APPs -  Physician Assistants and Nurse Practitioners) who all work together to provide you with the care you need, when you need it.  We recommend signing up for the patient portal called "MyChart".  Sign up information is provided on this After Visit Summary.  MyChart is used to connect with patients for Virtual Visits (Telemedicine).  Patients are able to view lab/test results, encounter notes, upcoming appointments, etc.  Non-urgent messages can be sent to your provider as well.   To learn more about what you can do with MyChart, go to [email or web address].    Your next appointment:   1 week(s) per provider he wants scheduled for 08/07/2021 at 10:55 am  The format for your next appointment:   In Person  Provider:   Murray Hodgkins, NP

## 2021-08-04 NOTE — Addendum Note (Signed)
Addended by: Britt Bottom on: 08/04/2021 10:07 AM   Modules accepted: Orders

## 2021-08-07 ENCOUNTER — Telehealth: Payer: Self-pay | Admitting: Nurse Practitioner

## 2021-08-07 ENCOUNTER — Encounter: Payer: Self-pay | Admitting: Nurse Practitioner

## 2021-08-07 ENCOUNTER — Ambulatory Visit (INDEPENDENT_AMBULATORY_CARE_PROVIDER_SITE_OTHER): Payer: Medicare HMO | Admitting: Nurse Practitioner

## 2021-08-07 ENCOUNTER — Other Ambulatory Visit: Payer: Self-pay

## 2021-08-07 VITALS — BP 136/68 | HR 53 | Ht 68.0 in | Wt 232.0 lb

## 2021-08-07 DIAGNOSIS — I2510 Atherosclerotic heart disease of native coronary artery without angina pectoris: Secondary | ICD-10-CM | POA: Diagnosis not present

## 2021-08-07 DIAGNOSIS — I5043 Acute on chronic combined systolic (congestive) and diastolic (congestive) heart failure: Secondary | ICD-10-CM

## 2021-08-07 DIAGNOSIS — N183 Chronic kidney disease, stage 3 unspecified: Secondary | ICD-10-CM

## 2021-08-07 DIAGNOSIS — E785 Hyperlipidemia, unspecified: Secondary | ICD-10-CM

## 2021-08-07 DIAGNOSIS — I255 Ischemic cardiomyopathy: Secondary | ICD-10-CM

## 2021-08-07 DIAGNOSIS — I10 Essential (primary) hypertension: Secondary | ICD-10-CM

## 2021-08-07 MED ORDER — FUROSEMIDE 80 MG PO TABS: 80.0000 mg | ORAL_TABLET | Freq: Every day | ORAL | 2 refills | Status: DC

## 2021-08-07 MED ORDER — HYDRALAZINE HCL 25 MG PO TABS: 25.0000 mg | ORAL_TABLET | Freq: Three times a day (TID) | ORAL | 1 refills | Status: DC
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
## 2021-08-07 NOTE — Patient Instructions (Signed)
Medication Instructions:  Your physician has recommended you make the following change in your medication:   INCREASE Hydrochlorothiazide to three times a day   *If you need a refill on your cardiac medications before your next appointment, please call your pharmacy*   Lab Work: BMET today  If you have labs (blood work) drawn today and your tests are completely normal, you will receive your results only by: [place] (if you have MyChart) OR A paper copy in the mail If you have any lab test that is abnormal or we need to change your treatment, we will call you to review the results.   Testing/Procedures: None   Follow-Up: At [facility], you and your health needs are our priority.  As part of our continuing mission to provide you with exceptional heart care, we have created designated Provider Care Teams.  These Care Teams include your primary Cardiologist (physician) and Advanced Practice Providers (APPs -  Physician Assistants and Nurse Practitioners) who all work together to provide you with the care you need, when you need it.  We recommend signing up for the patient portal called "MyChart".  Sign up information is provided on this After Visit Summary.  MyChart is used to connect with patients for Virtual Visits (Telemedicine).  Patients are able to view lab/test results, encounter notes, upcoming appointments, etc.  Non-urgent messages can be sent to your provider as well.   To learn more about what you can do with MyChart, go to [email or web address].    Your next appointment:   4-6  week(s)  The format for your next appointment:   In Person  Provider:   [name], MD or Murray Hodgkins, NP

## 2021-08-07 NOTE — Telephone Encounter (Signed)
error 

## 2021-08-08 LAB — BASIC METABOLIC PANEL
BUN/Creatinine Ratio: 17 (ref 10–24)
BUN: 27 mg/dL (ref 8–27)
CO2: 24 mmol/L (ref 20–29)
Calcium: 9.1 mg/dL (ref 8.6–10.2)
Chloride: 103 mmol/L (ref 96–106)
Creatinine, Ser: 1.61 mg/dL — ABNORMAL HIGH (ref 0.76–1.27)
Glucose: 155 mg/dL — ABNORMAL HIGH (ref 70–99)
Potassium: 4.8 mmol/L (ref 3.5–5.2)
Sodium: 142 mmol/L (ref 134–144)
eGFR: 42 mL/min/{1.73_m2} — ABNORMAL LOW (ref >59)

## 2021-08-12 ENCOUNTER — Telehealth: Payer: Self-pay

## 2021-08-12 NOTE — Telephone Encounter (Signed)
Janan Ridge, [place]  08/12/2021  9:20 AM EDT Back to Top    [employer] to call patient with lab results.  No answer. No VM.  Phone note started.    Theora Gianotti, NP  08/10/2021  8:15 AM EDT     Kidney function/electrolytes stable.

## 2021-08-17 NOTE — Telephone Encounter (Signed)
I spoke with the patient regarding his lab results. He voices understanding and is agreeable.

## 2021-08-17 NOTE — Telephone Encounter (Signed)
Attempted to call the patient. Per the person answering at his primary #- they are not with the patient. No DPR on file. They did take down our number for the patient to call us back.

## 2021-08-17 NOTE — Telephone Encounter (Signed)
Patient returning call please call [phone]

## 2021-08-23 ENCOUNTER — Other Ambulatory Visit: Payer: Self-pay | Admitting: Cardiology

## 2021-08-23 ENCOUNTER — Other Ambulatory Visit: Payer: Self-pay | Admitting: Family

## 2021-08-26 ENCOUNTER — Other Ambulatory Visit: Payer: Self-pay

## 2021-08-26 ENCOUNTER — Emergency Department: Payer: Medicare HMO

## 2021-08-26 ENCOUNTER — Inpatient Hospital Stay
Admission: EM | Admit: 2021-08-26 | Discharge: 2021-09-01 | DRG: 286 | Disposition: A | Discharge status: Home Health | Payer: Medicare HMO | Attending: Internal Medicine | Admitting: Internal Medicine

## 2021-08-26 ENCOUNTER — Encounter: Payer: Self-pay | Admitting: Emergency Medicine

## 2021-08-26 DIAGNOSIS — D696 Thrombocytopenia, unspecified: Secondary | ICD-10-CM | POA: Diagnosis present

## 2021-08-26 DIAGNOSIS — C61 Malignant neoplasm of prostate: Secondary | ICD-10-CM | POA: Diagnosis present

## 2021-08-26 DIAGNOSIS — J449 Chronic obstructive pulmonary disease, unspecified: Secondary | ICD-10-CM | POA: Diagnosis present

## 2021-08-26 DIAGNOSIS — K219 Gastro-esophageal reflux disease without esophagitis: Secondary | ICD-10-CM | POA: Diagnosis not present

## 2021-08-26 DIAGNOSIS — Z6835 Body mass index (BMI) 35.0-35.9, adult: Secondary | ICD-10-CM | POA: Diagnosis not present

## 2021-08-26 DIAGNOSIS — I5023 Acute on chronic systolic (congestive) heart failure: Secondary | ICD-10-CM

## 2021-08-26 DIAGNOSIS — I130 Hypertensive heart and chronic kidney disease with heart failure and stage 1 through stage 4 chronic kidney disease, or unspecified chronic kidney disease: Secondary | ICD-10-CM | POA: Diagnosis present

## 2021-08-26 DIAGNOSIS — Z23 Encounter for immunization: Secondary | ICD-10-CM

## 2021-08-26 DIAGNOSIS — I441 Atrioventricular block, second degree: Secondary | ICD-10-CM | POA: Diagnosis present

## 2021-08-26 DIAGNOSIS — I2510 Atherosclerotic heart disease of native coronary artery without angina pectoris: Secondary | ICD-10-CM | POA: Diagnosis present

## 2021-08-26 DIAGNOSIS — Z87891 Personal history of nicotine dependence: Secondary | ICD-10-CM

## 2021-08-26 DIAGNOSIS — I252 Old myocardial infarction: Secondary | ICD-10-CM | POA: Diagnosis not present

## 2021-08-26 DIAGNOSIS — N1831 Chronic kidney disease, stage 3a: Secondary | ICD-10-CM | POA: Diagnosis not present

## 2021-08-26 DIAGNOSIS — E785 Hyperlipidemia, unspecified: Secondary | ICD-10-CM

## 2021-08-26 DIAGNOSIS — I255 Ischemic cardiomyopathy: Secondary | ICD-10-CM | POA: Diagnosis present

## 2021-08-26 DIAGNOSIS — N179 Acute kidney failure, unspecified: Secondary | ICD-10-CM | POA: Diagnosis present

## 2021-08-26 DIAGNOSIS — Z91138 Patient's unintentional underdosing of medication regimen for other reason: Secondary | ICD-10-CM

## 2021-08-26 DIAGNOSIS — I25110 Atherosclerotic heart disease of native coronary artery with unstable angina pectoris: Secondary | ICD-10-CM | POA: Diagnosis present

## 2021-08-26 DIAGNOSIS — I5043 Acute on chronic combined systolic (congestive) and diastolic (congestive) heart failure: Secondary | ICD-10-CM | POA: Diagnosis present

## 2021-08-26 DIAGNOSIS — R0902 Hypoxemia: Secondary | ICD-10-CM | POA: Diagnosis present

## 2021-08-26 DIAGNOSIS — I5033 Acute on chronic diastolic (congestive) heart failure: Secondary | ICD-10-CM | POA: Diagnosis present

## 2021-08-26 DIAGNOSIS — E1122 Type 2 diabetes mellitus with diabetic chronic kidney disease: Secondary | ICD-10-CM | POA: Diagnosis present

## 2021-08-26 DIAGNOSIS — Z951 Presence of aortocoronary bypass graft: Secondary | ICD-10-CM

## 2021-08-26 DIAGNOSIS — Z794 Long term (current) use of insulin: Secondary | ICD-10-CM | POA: Diagnosis not present

## 2021-08-26 DIAGNOSIS — Z20822 Contact with and (suspected) exposure to covid-19: Secondary | ICD-10-CM | POA: Diagnosis present

## 2021-08-26 DIAGNOSIS — E1165 Type 2 diabetes mellitus with hyperglycemia: Secondary | ICD-10-CM | POA: Diagnosis present

## 2021-08-26 DIAGNOSIS — N1832 Chronic kidney disease, stage 3b: Secondary | ICD-10-CM

## 2021-08-26 DIAGNOSIS — I10 Essential (primary) hypertension: Secondary | ICD-10-CM | POA: Diagnosis present

## 2021-08-26 DIAGNOSIS — Z9114 Patient's other noncompliance with medication regimen: Secondary | ICD-10-CM

## 2021-08-26 DIAGNOSIS — R001 Bradycardia, unspecified: Secondary | ICD-10-CM | POA: Diagnosis present

## 2021-08-26 DIAGNOSIS — I272 Pulmonary hypertension, unspecified: Secondary | ICD-10-CM | POA: Diagnosis present

## 2021-08-26 DIAGNOSIS — Z79891 Long term (current) use of opiate analgesic: Secondary | ICD-10-CM

## 2021-08-26 DIAGNOSIS — E1129 Type 2 diabetes mellitus with other diabetic kidney complication: Secondary | ICD-10-CM | POA: Diagnosis present

## 2021-08-26 DIAGNOSIS — E669 Obesity, unspecified: Secondary | ICD-10-CM | POA: Diagnosis present

## 2021-08-26 DIAGNOSIS — I200 Unstable angina: Secondary | ICD-10-CM

## 2021-08-26 DIAGNOSIS — Z8674 Personal history of sudden cardiac arrest: Secondary | ICD-10-CM

## 2021-08-26 DIAGNOSIS — Z8249 Family history of ischemic heart disease and other diseases of the circulatory system: Secondary | ICD-10-CM

## 2021-08-26 DIAGNOSIS — I5031 Acute diastolic (congestive) heart failure: Secondary | ICD-10-CM | POA: Diagnosis not present

## 2021-08-26 DIAGNOSIS — I4510 Unspecified right bundle-branch block: Secondary | ICD-10-CM | POA: Diagnosis present

## 2021-08-26 DIAGNOSIS — Z7902 Long term (current) use of antithrombotics/antiplatelets: Secondary | ICD-10-CM

## 2021-08-26 DIAGNOSIS — I25118 Atherosclerotic heart disease of native coronary artery with other forms of angina pectoris: Secondary | ICD-10-CM | POA: Diagnosis not present

## 2021-08-26 DIAGNOSIS — Z7982 Long term (current) use of aspirin: Secondary | ICD-10-CM

## 2021-08-26 DIAGNOSIS — Z79899 Other long term (current) drug therapy: Secondary | ICD-10-CM

## 2021-08-26 DIAGNOSIS — E1169 Type 2 diabetes mellitus with other specified complication: Secondary | ICD-10-CM | POA: Diagnosis not present

## 2021-08-26 DIAGNOSIS — Z888 Allergy status to other drugs, medicaments and biological substances status: Secondary | ICD-10-CM

## 2021-08-26 DIAGNOSIS — R778 Other specified abnormalities of plasma proteins: Secondary | ICD-10-CM | POA: Diagnosis present

## 2021-08-26 LAB — CBC
HCT: 48.6 % (ref 39.0–52.0)
Hemoglobin: 15.6 g/dL (ref 13.0–17.0)
MCH: 29.6 pg (ref 26.0–34.0)
MCHC: 32.1 g/dL (ref 30.0–36.0)
MCV: 92.2 fL (ref 80.0–100.0)
Platelets: 138 10*3/uL — ABNORMAL LOW (ref 150–400)
RBC: 5.27 MIL/uL (ref 4.22–5.81)
RDW: 13.7 % (ref 11.5–15.5)
WBC: 6.2 10*3/uL (ref 4.0–10.5)
nRBC: 0 % (ref 0.0–0.2)

## 2021-08-26 LAB — TROPONIN I (HIGH SENSITIVITY)
Troponin I (High Sensitivity): 22 ng/L — ABNORMAL HIGH (ref <18)
Troponin I (High Sensitivity): 34 ng/L — ABNORMAL HIGH (ref <18)
Troponin I (High Sensitivity): 58 ng/L — ABNORMAL HIGH (ref <18)
Troponin I (High Sensitivity): 59 ng/L — ABNORMAL HIGH (ref <18)

## 2021-08-26 LAB — COMPREHENSIVE METABOLIC PANEL
ALT: 93 U/L — ABNORMAL HIGH (ref 0–44)
AST: 56 U/L — ABNORMAL HIGH (ref 15–41)
Albumin: 3.8 g/dL (ref 3.5–5.0)
Alkaline Phosphatase: 84 U/L (ref 38–126)
Anion gap: 10 (ref 5–15)
BUN: 27 mg/dL — ABNORMAL HIGH (ref 8–23)
CO2: 22 mmol/L (ref 22–32)
Calcium: 8.7 mg/dL — ABNORMAL LOW (ref 8.9–10.3)
Chloride: 108 mmol/L (ref 98–111)
Creatinine, Ser: 1.32 mg/dL — ABNORMAL HIGH (ref 0.61–1.24)
GFR, Estimated: 53 mL/min — ABNORMAL LOW (ref >60)
Glucose, Bld: 135 mg/dL — ABNORMAL HIGH (ref 70–99)
Potassium: 3.6 mmol/L (ref 3.5–5.1)
Sodium: 140 mmol/L (ref 135–145)
Total Bilirubin: 0.6 mg/dL (ref 0.3–1.2)
Total Protein: 6.7 g/dL (ref 6.5–8.1)

## 2021-08-26 LAB — RESP PANEL BY RT-PCR (FLU A&B, COVID) ARPGX2
Influenza A by PCR: NEGATIVE
Influenza B by PCR: NEGATIVE
SARS Coronavirus 2 by RT PCR: NEGATIVE

## 2021-08-26 LAB — HEMOGLOBIN A1C
Hgb A1c MFr Bld: 7.1 % — ABNORMAL HIGH (ref 4.8–5.6)
Mean Plasma Glucose: 157 mg/dL

## 2021-08-26 LAB — CBG MONITORING, ED
Glucose-Capillary: 119 mg/dL — ABNORMAL HIGH (ref 70–99)
Glucose-Capillary: 139 mg/dL — ABNORMAL HIGH (ref 70–99)

## 2021-08-26 LAB — PROCALCITONIN: Procalcitonin: <0.1 ng/mL

## 2021-08-26 LAB — BRAIN NATRIURETIC PEPTIDE: B Natriuretic Peptide: 1040.2 pg/mL — ABNORMAL HIGH (ref 0.0–100.0)

## 2021-08-26 MED ORDER — AMLODIPINE BESYLATE 5 MG PO TABS
10.0000 mg | ORAL_TABLET | Freq: Every day | ORAL | Status: DC
  Administered 2021-08-26: 10 mg via ORAL
  Filled 2021-08-26: qty 2

## 2021-08-26 MED ORDER — HYDRALAZINE HCL 20 MG/ML IJ SOLN: 5.0000 mg | INTRAMUSCULAR | Status: DC | PRN

## 2021-08-26 MED ORDER — ROSUVASTATIN CALCIUM 10 MG PO TABS
40.0000 mg | ORAL_TABLET | Freq: Every day | ORAL | Status: DC
  Administered 2021-08-26 – 2021-09-01 (×6): 40 mg via ORAL
  Filled 2021-08-26 (×3): qty 4
  Filled 2021-08-26: qty 2
  Filled 2021-08-26: qty 4

## 2021-08-26 MED ORDER — MAGNESIUM OXIDE 400 MG PO TABS
400.0000 mg | ORAL_TABLET | Freq: Every day | ORAL | Status: DC
  Administered 2021-08-26 – 2021-09-01 (×7): 400 mg via ORAL
  Filled 2021-08-26 (×13): qty 1

## 2021-08-26 MED ORDER — ALBUTEROL SULFATE (2.5 MG/3ML) 0.083% IN NEBU: 3.0000 mL | INHALATION_SOLUTION | RESPIRATORY_TRACT | Status: DC | PRN

## 2021-08-26 MED ORDER — FLUTICASONE PROPIONATE HFA 44 MCG/ACT IN AERO: 2.0000 | INHALATION_SPRAY | Freq: Two times a day (BID) | RESPIRATORY_TRACT | Status: DC

## 2021-08-26 MED ORDER — ASPIRIN EC 81 MG PO TBEC
81.0000 mg | DELAYED_RELEASE_TABLET | Freq: Every day | ORAL | Status: DC
  Administered 2021-08-26 – 2021-09-01 (×6): 81 mg via ORAL
  Filled 2021-08-26 (×7): qty 1

## 2021-08-26 MED ORDER — BUDESONIDE 0.25 MG/2ML IN SUSP
0.2500 mg | Freq: Two times a day (BID) | RESPIRATORY_TRACT | Status: DC
  Administered 2021-08-26 – 2021-09-01 (×11): 0.25 mg via RESPIRATORY_TRACT
  Filled 2021-08-26 (×12): qty 2

## 2021-08-26 MED ORDER — OXYBUTYNIN CHLORIDE 5 MG PO TABS
5.0000 mg | ORAL_TABLET | Freq: Two times a day (BID) | ORAL | Status: DC
  Administered 2021-08-26 – 2021-09-01 (×12): 5 mg via ORAL
  Filled 2021-08-26 (×16): qty 1

## 2021-08-26 MED ORDER — ONDANSETRON HCL 4 MG/2ML IJ SOLN: 4.0000 mg | Freq: Three times a day (TID) | INTRAMUSCULAR | Status: DC | PRN

## 2021-08-26 MED ORDER — FUROSEMIDE 10 MG/ML IJ SOLN
60.0000 mg | Freq: Two times a day (BID) | INTRAMUSCULAR | Status: DC
  Administered 2021-08-26 – 2021-08-28 (×5): 60 mg via INTRAVENOUS
  Filled 2021-08-26 (×2): qty 8
  Filled 2021-08-26 (×2): qty 6
  Filled 2021-08-26 (×2): qty 8

## 2021-08-26 MED ORDER — INSULIN DETEMIR 100 UNIT/ML ~~LOC~~ SOLN
15.0000 [IU] | Freq: Two times a day (BID) | SUBCUTANEOUS | Status: DC
Start: 2021-08-26 — End: 2021-09-01
  Administered 2021-08-26 – 2021-09-01 (×10): 15 [IU] via SUBCUTANEOUS
  Filled 2021-08-26 (×15): qty 0.15

## 2021-08-26 MED ORDER — ADULT MULTIVITAMIN W/MINERALS CH
1.0000 | ORAL_TABLET | Freq: Every day | ORAL | Status: DC
  Administered 2021-08-26 – 2021-09-01 (×7): 1 via ORAL
  Filled 2021-08-26 (×7): qty 1

## 2021-08-26 MED ORDER — FUROSEMIDE 10 MG/ML IJ SOLN
80.0000 mg | Freq: Once | INTRAMUSCULAR | Status: AC
  Administered 2021-08-26: 80 mg via INTRAVENOUS
  Filled 2021-08-26: qty 8

## 2021-08-26 MED ORDER — INSULIN ASPART 100 UNIT/ML IJ SOLN
0.0000 [IU] | Freq: Every day | INTRAMUSCULAR | Status: DC
Start: 2021-08-26 — End: 2021-09-01

## 2021-08-26 MED ORDER — HYDRALAZINE HCL 25 MG PO TABS
25.0000 mg | ORAL_TABLET | Freq: Three times a day (TID) | ORAL | Status: DC
  Administered 2021-08-26 – 2021-09-01 (×18): 25 mg via ORAL
  Filled 2021-08-26 (×18): qty 1

## 2021-08-26 MED ORDER — ACYCLOVIR 200 MG PO CAPS
800.0000 mg | ORAL_CAPSULE | Freq: Every day | ORAL | Status: DC
  Administered 2021-08-26 – 2021-09-01 (×6): 800 mg via ORAL
  Filled 2021-08-26 (×7): qty 4

## 2021-08-26 MED ORDER — DM-GUAIFENESIN ER 30-600 MG PO TB12: 1.0000 | ORAL_TABLET | Freq: Two times a day (BID) | ORAL | Status: DC | PRN

## 2021-08-26 MED ORDER — POTASSIUM CHLORIDE CRYS ER 20 MEQ PO TBCR
40.0000 meq | EXTENDED_RELEASE_TABLET | Freq: Once | ORAL | Status: AC
  Administered 2021-08-26: 40 meq via ORAL
  Filled 2021-08-26: qty 2

## 2021-08-26 MED ORDER — PANTOPRAZOLE SODIUM 40 MG PO TBEC
40.0000 mg | DELAYED_RELEASE_TABLET | Freq: Two times a day (BID) | ORAL | Status: DC
  Administered 2021-08-26 – 2021-09-01 (×13): 40 mg via ORAL
  Filled 2021-08-26 (×13): qty 1

## 2021-08-26 MED ORDER — ACETAMINOPHEN 325 MG PO TABS
650.0000 mg | ORAL_TABLET | Freq: Four times a day (QID) | ORAL | Status: DC | PRN
  Administered 2021-08-28 – 2021-08-29 (×2): 650 mg via ORAL
  Filled 2021-08-26 (×4): qty 2

## 2021-08-26 MED ORDER — INSULIN ASPART 100 UNIT/ML IJ SOLN
0.0000 [IU] | Freq: Three times a day (TID) | INTRAMUSCULAR | Status: DC
  Administered 2021-08-27: 2 [IU] via SUBCUTANEOUS
  Administered 2021-08-28: 1 [IU] via SUBCUTANEOUS
  Administered 2021-08-29: 2 [IU] via SUBCUTANEOUS
  Administered 2021-08-29: 1 [IU] via SUBCUTANEOUS
  Administered 2021-08-30 – 2021-08-31 (×2): 2 [IU] via SUBCUTANEOUS
  Administered 2021-09-01: 1 [IU] via SUBCUTANEOUS
  Filled 2021-08-26 (×8): qty 1

## 2021-08-26 MED ORDER — ENOXAPARIN SODIUM 40 MG/0.4ML IJ SOSY
40.0000 mg | PREFILLED_SYRINGE | INTRAMUSCULAR | Status: DC
  Administered 2021-08-26 – 2021-08-31 (×5): 40 mg via SUBCUTANEOUS
  Filled 2021-08-26 (×5): qty 0.4

## 2021-08-26 MED ORDER — CLOPIDOGREL BISULFATE 75 MG PO TABS
75.0000 mg | ORAL_TABLET | Freq: Every day | ORAL | Status: DC
  Administered 2021-08-26 – 2021-09-01 (×7): 75 mg via ORAL
  Filled 2021-08-26 (×7): qty 1

## 2021-08-26 NOTE — ED Triage Notes (Signed)
Presents via EMS from home with increased SOB since last pm  per EMS wheezing noted at home  1 duo neb given

## 2021-08-26 NOTE — ED Notes (Signed)
CBG 119 

## 2021-08-26 NOTE — ED Notes (Signed)
Messaged pharmacy for missing acyclovir and statin.

## 2021-08-26 NOTE — ED Provider Notes (Signed)
[facility] Emergency Department Provider Note  ____________________________________________   Event Date/Time   First MD Initiated Contact with Patient 08/26/21 0732     (approximate)  I have reviewed the triage vital signs and the nursing notes.   HISTORY  Chief Complaint Shortness of Breath    HPI [name] is a 86 y.o. male with extensive past medical history as below including CAD, heart failure with reduced EF, ischemic cardiomyopathy, hyperlipidemia, here with shortness of breath.  The patient states that for the last several days, he has had progressively worsening shortness of breath.  This acutely worsened over the last 24 hours.  He has felt dyspneic, particularly with exertion and when lying flat.  Denies any sputum production.  His shortness of breath seems worse with lying flat and exertion.  He has had increased leg swelling.  He saw his primary cardiologist recently and did have his Lasix increased, which did not necessarily improve his symptoms.  Denies fevers.  No sputum production.  No known sick contacts.    Past Medical History:  Diagnosis Date   CAD (coronary artery disease)    a. 08/2019 VF arrest-->sev 3vd on cath-->CABGx5 (LIMA->LAD, VG->OM->LCX, VG->RPDA, VG->Diag).   CKD (chronic kidney disease), stage III (HCC)    Diabetes mellitus without complication ([place])    HFimpEF (heart failure with improved ejection fraction) ([place])    a. 08/2020 Echo: EF 40-45%; b. 11/2020 Echo: EF 55%, no rwma, mild LVH, Gr2 DD. nl RV fxn.   Hyperlipidemia LDL goal <70    Hypertension    Ischemic cardiomyopathy    a. 08/2020 Echo: EF 40-45%; b. 11/2020 Echo: EF 55%.    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   CAD, multiple vessel 08/25/2020   Non-ST elevation (NSTEMI) myocardial infarction ([place]) 08/21/2020   History of cardiac arrest 08/21/2020   HFrEF (heart failure with reduced ejection fraction) ([place])    History of sustained ventricular fibrillation 08/18/2020   Acute respiratory failure with hypoxia (HCC)    Chronic diastolic heart failure ([place])    Stage 3a chronic kidney disease ([place]) 11/20/2019   UTI (urinary tract infection) 07/26/2019   HTN (hypertension) 07/26/2019   Diabetes ([place]) 07/26/2019   Angina pectoris ([place]) 12/03/2008   Status post percutaneous transluminal coronary angioplasty 10/08/2008   Malignant neoplasm of prostate ([place]) 10/05/2005   Essential hypertension 08/24/1989    Past Surgical History:  Procedure Laterality Date   BACK SURGERY     CARDIAC CATHETERIZATION     CLIPPING OF ATRIAL APPENDAGE N/A 08/29/2020   Procedure: CLIPPING OF ATRIAL APPENDAGE USING ATRICURE 18 MM ATRICLIP FLEX-V;  Surgeon: Grace Isaac, MD;  Location: [facility];  Service: Open Heart Surgery;  Laterality: N/A;   CORONARY ARTERY BYPASS GRAFT N/A 08/29/2020   Procedure: CORONARY ARTERY BYPASS GRAFTING (CABG) X 5 USING LEFT INTERNAL MAMMARY ARTERY AND ENDOSCOPICALLY HARVESTED RIGHT GREATER SAPHENOUS VEIN. LIMA TO LAD, SVG TO OM SEQ TO CIRC, SVG TO PD, SVG TO DIAG.;  Surgeon: Grace Isaac, MD;  Location: [facility];  Service: Open Heart Surgery;  Laterality: N/A;   ENDOVEIN HARVEST OF GREATER SAPHENOUS VEIN Right 08/29/2020   Procedure: ENDOVEIN HARVEST OF  GREATER SAPHENOUS VEIN;  Surgeon: Grace Isaac, MD;  Location: [facility];  Service: Open Heart Surgery;  Laterality: Right;   HERNIA REPAIR     LEFT HEART CATH AND CORONARY ANGIOGRAPHY N/A 08/25/2020   Procedure: LEFT HEART CATH AND CORONARY ANGIOGRAPHY;  Surgeon: [name], MD;  Location: [facility];  Service: Cardiovascular;  Laterality: N/A;   TEE WITHOUT CARDIOVERSION N/A 08/29/2020    Procedure: TRANSESOPHAGEAL ECHOCARDIOGRAM (TEE);  Surgeon: Grace Isaac, MD;  Location: [facility];  Service: Open Heart Surgery;  Laterality: N/A;    Prior to Admission medications   Medication Sig Start Date End Date Taking? Authorizing Provider  acyclovir (ZOVIRAX) 800 MG tablet Take 800 mg by mouth daily. 12/02/20  Yes [provider]  amLODipine (NORVASC) 10 MG tablet TAKE 1 TABLET BY MOUTH EVERY DAY 08/24/21  Yes Vickie Epley, MD  aspirin EC 81 MG tablet Take 81 mg by mouth daily.   Yes [provider]  clopidogrel (PLAVIX) 75 MG tablet TAKE 1 TABLET BY MOUTH EVERY DAY 05/17/21  Yes Loel Dubonnet, NP  fluticasone (FLOVENT HFA) 44 MCG/ACT inhaler Inhale 2 puffs into the lungs 2 (two) times daily.   Yes [provider]  furosemide (LASIX) 80 MG tablet Take 1 tablet (80 mg total) by mouth daily. 08/07/21  Yes Theora Gianotti, NP  hydrALAZINE (APRESOLINE) 25 MG tablet Take 1 tablet (25 mg total) by mouth 3 (three) times daily. 08/07/21 11/05/21 Yes Theora Gianotti, NP  insulin detemir (LEVEMIR) 100 UNIT/ML injection Inject 0.2 mLs (20 Units total) into the skin 2 (two) times daily. 09/10/20  Yes Gold, Wayne E, PA-C  magnesium oxide (MAG-OX) 400 MG tablet TAKE 1 TABLET BY MOUTH EVERY DAY 06/01/21  Yes [name], MD  Multiple Vitamin (MULTIVITAMIN WITH MINERALS) TABS tablet Take 1 tablet by mouth daily. 09/11/20  Yes Gold, Wayne E, PA-C  oxybutynin (DITROPAN) 5 MG tablet Take 5 mg by mouth 2 (two) times daily.   Yes [provider]  pantoprazole (PROTONIX) 40 MG tablet Take 40 mg by mouth 2 (two) times daily.   Yes [provider]  rosuvastatin (CRESTOR) 40 MG tablet Take 1 tablet (40 mg total) by mouth daily. 06/17/21  Yes [name], MD  acetaminophen (TYLENOL) 500 MG tablet Take 500-1,000 mg by mouth every 6 (six) hours as needed for mild pain or moderate pain.     [provider]  traMADol (ULTRAM)  50 MG tablet Take 1 tablet (50 mg total) by mouth every 12 (twelve) hours as needed for severe pain. Further refill request will need to be sent to primary care. Patient not taking: No sig reported 02/27/21   [name], MD    Allergies Metformin  Family History  Problem Relation Age of Onset   Heart attack Mother     Social History Social History   Tobacco Use   Smoking status: Former    Types: Cigarettes    Quit date: 07/26/1996    Years since quitting: 25.1   Smokeless tobacco: Never  Vaping Use   Vaping Use: Never used  Substance Use Topics   Alcohol use: Not Currently   Drug use: Never    Review of Systems  Review of Systems  Constitutional:  Positive for fatigue. Negative for chills and fever.  HENT:  Negative for sore throat.   Respiratory:  Positive for cough and shortness of breath.   Cardiovascular:  Positive for leg  swelling. Negative for chest pain.  Gastrointestinal:  Negative for abdominal pain.  Genitourinary:  Negative for flank pain.  Musculoskeletal:  Negative for neck pain.  Skin:  Negative for rash and wound.  Allergic/Immunologic: Negative for immunocompromised state.  Neurological:  Positive for weakness. Negative for numbness.  Hematological:  Does not bruise/bleed easily.  All other systems reviewed and are negative.   ____________________________________________  PHYSICAL EXAM:      VITAL SIGNS: ED Triage Vitals [08/26/21 0754]  Enc Vitals Group     BP (!) 177/85     Pulse Rate (!) 58     Resp (!) 26     Temp 97.8 F (36.6 C)     Temp Source Oral     SpO2 98 %     Weight 232 lb (105.2 kg)     Height 5\' 8"  (1.727 m)     Head Circumference      Peak Flow      Pain Score 4     Pain Loc      Pain Edu?      Excl. in [place]?      Physical Exam Vitals and nursing note reviewed.  Constitutional:      General: He is not in acute distress.    Appearance: He is well-developed.  HENT:     Head: Normocephalic and atraumatic.   Eyes:     Conjunctiva/sclera: Conjunctivae normal.  Cardiovascular:     Rate and Rhythm: Normal rate and regular rhythm.     Heart sounds: Normal heart sounds. No murmur heard.   No friction rub.  Pulmonary:     Effort: Pulmonary effort is normal. No respiratory distress.     Breath sounds: Examination of the right-middle field reveals rales. Examination of the left-middle field reveals rales. Examination of the right-lower field reveals rales. Examination of the left-lower field reveals rales. Rales present. No wheezing.  Abdominal:     General: There is no distension.     Palpations: Abdomen is soft.     Tenderness: There is no abdominal tenderness.  Musculoskeletal:     Cervical back: Neck supple.     Right lower leg: Edema (1+) present.     Left lower leg: Edema (2+) present.  Skin:    General: Skin is warm.     Capillary Refill: Capillary refill takes less than 2 seconds.  Neurological:     Mental Status: He is alert and oriented to person, place, and time.     Motor: No abnormal muscle tone.      ____________________________________________   LABS (all labs ordered are listed, but only abnormal results are displayed)  Labs Reviewed  CBC - Abnormal; Notable for the following components:      Result Value   Platelets 138 (*)    All other components within normal limits  BRAIN NATRIURETIC PEPTIDE - Abnormal; Notable for the following components:   B Natriuretic Peptide 1,040.2 (*)    All other components within normal limits  COMPREHENSIVE METABOLIC PANEL - Abnormal; Notable for the following components:   Glucose, Bld 135 (*)    BUN 27 (*)    Creatinine, Ser 1.32 (*)    Calcium 8.7 (*)    AST 56 (*)    ALT 93 (*)    GFR, Estimated 53 (*)    All other components within normal limits  TROPONIN I (HIGH SENSITIVITY) - Abnormal; Notable for the following components:   Troponin I (High Sensitivity) 22 (*)  All other components within normal limits  TROPONIN I  (HIGH SENSITIVITY) - Abnormal; Notable for the following components:   Troponin I (High Sensitivity) 34 (*)    All other components within normal limits  RESP PANEL BY RT-PCR (FLU A&B, COVID) ARPGX2  PROCALCITONIN  HEMOGLOBIN A1C  TROPONIN I (HIGH SENSITIVITY)    ____________________________________________  EKG: Sinus bradycardia with first-degree AV block, ventricular 47.  PR 224, QRS 106, QTc 470.  Right bundle branch block.  No acute ST elevations or depressions. ________________________________________  RADIOLOGY All imaging, including plain films, CT scans, and ultrasounds, independently reviewed by me, and interpretations confirmed via formal radiology reads.  ED MD interpretation:   Chest x-ray: New bibasilar airspace opacities  Official radiology report(s): DG Chest 2 View  Result Date: 08/26/2021 CLINICAL DATA:  Pt complains of increased SOB since last pm. per EMS wheezing noted at home EXAM: CHEST - 2 VIEW COMPARISON:  11/20/2020 FINDINGS: New moderate airspace opacities in both lower lobes. Heart size normal.  CABG markers.  Left atrial appendage clip. Blunting of posterior costophrenic angles suggesting small effusions. No pneumothorax. Sternotomy wires. Partially calcified right scapular/chest wall mass again noted. IMPRESSION: 1. New bibasilar airspace infiltrates with possible small effusions. Electronically Signed   By: Lucrezia Europe M.D.   On: 08/26/2021 08:23    ____________________________________________  PROCEDURES   Procedure(s) performed (including Critical Care):  .1-3 Lead EKG Interpretation Performed by: Duffy Bruce, MD Authorized by: Duffy Bruce, MD     Interpretation: normal     ECG rate:  60-80   ECG rate assessment: normal     Rhythm: sinus rhythm     Ectopy: none     Conduction: normal   Comments:     Indication: CHF  ____________________________________________  INITIAL IMPRESSION / MDM / ASSESSMENT AND PLAN / ED COURSE  As part  of my medical decision making, I reviewed the following data within the [facility] notes reviewed and incorporated, Old chart reviewed, Notes from prior ED visits, and [place] Controlled Substance Database       *[name] was evaluated in Emergency Department on 08/26/2021 for the symptoms described in the history of present illness. He was evaluated in the context of the global COVID-19 pandemic, which necessitated consideration that the patient might be at risk for infection with the SARS-CoV-2 virus that causes COVID-19. Institutional protocols and algorithms that pertain to the evaluation of patients at risk for COVID-19 are in a state of rapid change based on information released by regulatory bodies including the CDC and federal and state organizations. These policies and algorithms were followed during the patient's care in the ED.  Some ED evaluations and interventions may be delayed as a result of limited staffing during the pandemic.*     Medical Decision Making: 86 year old male with history of congestive heart failure here with increased work of breathing and respiratory distress.  The patient has recently been managed by cardiology for CHF exacerbation with increase in outpatient Lasix.  I reviewed his notes from recent visit with Murray Hodgkins.  On arrival here, he is mildly hypoxic with increased work of breathing and bilateral rails.  Suspect acute on chronic CHF exacerbation.Given that he has already increased his home Lasix, will start him on IV Lasix and plan to admit for this.  EKG here reviewed by me and shows no signs of acute ischemia.  Chest x-ray is consistent with edema.  Procalcitonin negative, do not suspect superimposed infectious process.  CMP at baseline.  BNP elevated above baseline.  Troponin 22, likely demand related.  CBC without leukocytosis or anemia.  COVID-negative.  Will plan to admit for further management.  Consulted hospitalist who will  admit.  ____________________________________________  FINAL CLINICAL IMPRESSION(S) / ED DIAGNOSES  Final diagnoses:  Acute on chronic systolic congestive heart failure (HCC)  Hypoxia     MEDICATIONS GIVEN DURING THIS VISIT:  Medications  furosemide (LASIX) injection 60 mg (has no administration in time range)  albuterol (PROVENTIL) (2.5 MG/3ML) 0.083% nebulizer solution 3 mL (has no administration in time range)  dextromethorphan-guaiFENesin (MUCINEX DM) 30-600 MG per 12 hr tablet 1 tablet (has no administration in time range)  ondansetron (ZOFRAN) injection 4 mg (has no administration in time range)  acetaminophen (TYLENOL) tablet 650 mg (has no administration in time range)  hydrALAZINE (APRESOLINE) injection 5 mg (has no administration in time range)  insulin aspart (novoLOG) injection 0-9 Units (has no administration in time range)  insulin aspart (novoLOG) injection 0-5 Units (has no administration in time range)  enoxaparin (LOVENOX) injection 40 mg (has no administration in time range)  aspirin EC tablet 81 mg (has no administration in time range)  acyclovir (ZOVIRAX) 200 MG capsule 800 mg (has no administration in time range)  amLODipine (NORVASC) tablet 10 mg (has no administration in time range)  hydrALAZINE (APRESOLINE) tablet 25 mg (has no administration in time range)  rosuvastatin (CRESTOR) tablet 40 mg (has no administration in time range)  insulin detemir (LEVEMIR) injection 15 Units (has no administration in time range)  pantoprazole (PROTONIX) EC tablet 40 mg (has no administration in time range)  oxybutynin (DITROPAN) tablet 5 mg (has no administration in time range)  clopidogrel (PLAVIX) tablet 75 mg (has no administration in time range)  fluticasone (FLOVENT HFA) 44 MCG/ACT inhaler 2 puff (has no administration in time range)  furosemide (LASIX) injection 80 mg (80 mg Intravenous Given 08/26/21 0843)  potassium chloride SA (KLOR-CON) CR tablet 40 mEq (40 mEq  Oral Given 08/26/21 1010)     ED Discharge Orders     None        Note:  This document was prepared using Dragon voice recognition software and may include unintentional dictation errors.   Duffy Bruce, MD 08/26/21 [id number]

## 2021-08-26 NOTE — H&P (Signed)
History and Physical    [name] [id number] DOB: September 18, 1935 DOA: 08/26/2021  Referring MD/NP/PA:   PCP: Tomasita Morrow, MD   Patient coming from:  The patient is coming from home.  At baseline, pt is independent for most of ADL.        Chief Complaint: SOB  HPI: [name] is a 86 y.o. male with medical history significant of dCHF, HTN, HLD, DM, GERD, CKD stage IIIa, CAD, CABG, cardiac arrest, who presents with shortness of breath.  Patient states that he has shortness breath for several days, which has been progressively worsening, particularly on exertion.  It is worse when he is laying flat.  He also has worsening leg edema bilaterally.  He has cough with clear mucus production.  No chest pain, fever or chills.  Denies nausea vomiting, diarrhea or abdominal pain.  No symptoms of UTI. He was seen by his primary cardiologist recently and did have his Lasix dose increased, but his symptoms has not improved significantly.  ED Course: pt was found to have BNP 1040, troponin level 22, negative COVID PCR, procalcitonin < 0.10, renal function close to baseline, temperature normal, blood pressure 177/85, heart rate 58, oxygen saturation 90% on room air, which improved to 99% on 2 L oxygen, RR 20.  Chest x-ray showed bilateral basilar opacity with possible small pleural effusion.  Patient is admitted to progressive bed as inpatient  Review of Systems:   General: no fevers, chills, no body weight gain, has fatigue HEENT: no blurry vision, hearing changes or sore throat Respiratory: has dyspnea, coughing, no wheezing CV: no chest pain, no palpitations GI: no nausea, vomiting, abdominal pain, diarrhea, constipation GU: no dysuria, burning on urination, increased urinary frequency, hematuria  Ext: has leg edema Neuro: no unilateral weakness, numbness, or tingling, no vision change or hearing loss Skin: no rash, no skin tear. MSK: No muscle spasm, no deformity, no limitation of  range of movement in spin Heme: No easy bruising.  Travel history: No recent long distant travel.  Allergy:  Allergies  Allergen Reactions   Metformin Diarrhea    Past Medical History:  Diagnosis Date   CAD (coronary artery disease)    a. 08/2019 VF arrest-->sev 3vd on cath-->CABGx5 (LIMA->LAD, VG->OM->LCX, VG->RPDA, VG->Diag).   CKD (chronic kidney disease), stage III (HCC)    Diabetes mellitus without complication ([place])    HFimpEF (heart failure with improved ejection fraction) ([place])    a. 08/2020 Echo: EF 40-45%; b. 11/2020 Echo: EF 55%, no rwma, mild LVH, Gr2 DD. nl RV fxn.   Hyperlipidemia LDL goal <70    Hypertension    Ischemic cardiomyopathy    a. 08/2020 Echo: EF 40-45%; b. 11/2020 Echo: EF 55%.    Past Surgical History:  Procedure Laterality Date   BACK SURGERY     CARDIAC CATHETERIZATION     CLIPPING OF ATRIAL APPENDAGE N/A 08/29/2020   Procedure: CLIPPING OF ATRIAL APPENDAGE USING ATRICURE 58 MM ATRICLIP FLEX-V;  Surgeon: Grace Isaac, MD;  Location: [facility];  Service: Open Heart Surgery;  Laterality: N/A;   CORONARY ARTERY BYPASS GRAFT N/A 08/29/2020   Procedure: CORONARY ARTERY BYPASS GRAFTING (CABG) X 5 USING LEFT INTERNAL MAMMARY ARTERY AND ENDOSCOPICALLY HARVESTED RIGHT GREATER SAPHENOUS VEIN. LIMA TO LAD, SVG TO OM SEQ TO CIRC, SVG TO PD, SVG TO DIAG.;  Surgeon: Grace Isaac, MD;  Location: [facility];  Service: Open Heart Surgery;  Laterality: N/A;   ENDOVEIN HARVEST OF GREATER SAPHENOUS VEIN Right  08/29/2020   Procedure: ENDOVEIN HARVEST OF GREATER SAPHENOUS VEIN;  Surgeon: Grace Isaac, MD;  Location: [facility];  Service: Open Heart Surgery;  Laterality: Right;   HERNIA REPAIR     LEFT HEART CATH AND CORONARY ANGIOGRAPHY N/A 08/25/2020   Procedure: LEFT HEART CATH AND CORONARY ANGIOGRAPHY;  Surgeon: [name], MD;  Location: [facility];  Service: Cardiovascular;  Laterality: N/A;   TEE WITHOUT CARDIOVERSION N/A 08/29/2020   Procedure:  TRANSESOPHAGEAL ECHOCARDIOGRAM (TEE);  Surgeon: Grace Isaac, MD;  Location: [facility];  Service: Open Heart Surgery;  Laterality: N/A;    Social History:  reports that he quit smoking about 25 years ago. His smoking use included cigarettes. He has never used smokeless tobacco. He reports that he does not currently use alcohol. He reports that he does not use drugs.  Family History:  Family History  Problem Relation Age of Onset   Heart attack Mother      Prior to Admission medications   Medication Sig Start Date End Date Taking? Authorizing Provider  acetaminophen (TYLENOL) 500 MG tablet Take 500-1,000 mg by mouth every 6 (six) hours as needed for mild pain or moderate pain.     [provider]  acyclovir (ZOVIRAX) 800 MG tablet Take 800 mg by mouth daily. 12/02/20   [provider]  amLODipine (NORVASC) 10 MG tablet TAKE 1 TABLET BY MOUTH EVERY DAY 08/24/21   Vickie Epley, MD  aspirin EC 81 MG tablet Take 81 mg by mouth daily.    [provider]  clopidogrel (PLAVIX) 75 MG tablet TAKE 1 TABLET BY MOUTH EVERY DAY 05/17/21   Loel Dubonnet, NP  fluticasone (FLOVENT HFA) 44 MCG/ACT inhaler Inhale 2 puffs into the lungs 2 (two) times daily.    [provider]  furosemide (LASIX) 80 MG tablet Take 1 tablet (80 mg total) by mouth daily. 08/07/21   Theora Gianotti, NP  hydrALAZINE (APRESOLINE) 25 MG tablet Take 1 tablet (25 mg total) by mouth 3 (three) times daily. 08/07/21 11/05/21  Theora Gianotti, NP  insulin detemir (LEVEMIR) 100 UNIT/ML injection Inject 0.2 mLs (20 Units total) into the skin 2 (two) times daily. 09/10/20   Gold, Wayne E, PA-C  magnesium oxide (MAG-OX) 400 MG tablet TAKE 1 TABLET BY MOUTH EVERY DAY 06/01/21   [name], MD  Multiple Vitamin (MULTIVITAMIN WITH MINERALS) TABS tablet Take 1 tablet by mouth daily. 09/11/20   Gold, Patrick Jupiter E, PA-C  oxybutynin (DITROPAN) 5 MG tablet Take 5 mg by mouth 2 (two) times  daily.    [provider]  pantoprazole (PROTONIX) 40 MG tablet Take 40 mg by mouth 2 (two) times daily.    [provider]  rosuvastatin (CRESTOR) 40 MG tablet Take 1 tablet (40 mg total) by mouth daily. 06/17/21   [name], MD  traMADol (ULTRAM) 50 MG tablet Take 1 tablet (50 mg total) by mouth every 12 (twelve) hours as needed for severe pain. Further refill request will need to be sent to primary care. 02/27/21   [name], MD    Physical Exam: Vitals:   08/26/21 0754 08/26/21 1137  BP: (!) 177/85 (!) 149/91  Pulse: (!) 58 (!) 52  Resp: (!) 26 13  Temp: 97.8 F (36.6 C)   TempSrc: Oral   SpO2: 98% 98%  Weight: 105.2 kg   Height: 5\' 8"  (1.727 m)    General: Not in acute distress HEENT:  Eyes: PERRL, EOMI, no scleral icterus.       ENT: No discharge from the ears and nose, no pharynx injection, no tonsillar enlargement.        Neck: positive JVD, no bruit, no mass felt. Heme: No neck lymph node enlargement. Cardiac: S1/S2, RRR, No murmurs, No gallops or rubs. Respiratory: Has fine crackles at bilateral base GI: Soft, nondistended, nontender, no rebound pain, no organomegaly, BS present. GU: No hematuria Ext: 1+ pitting leg edema bilaterally. 1+DP/PT pulse bilaterally. Musculoskeletal: No joint deformities, No joint redness or warmth, no limitation of ROM in spin. Skin: No rashes.  Neuro: Alert, oriented X3, cranial nerves II-XII grossly intact, moves all extremities normally.  Psych: Patient is not psychotic, no suicidal or hemocidal ideation.  Labs on Admission: I have personally reviewed following labs and imaging studies  CBC: Recent Labs  Lab 08/26/21 0750  WBC 6.2  HGB 15.6  HCT 48.6  MCV 92.2  PLT 378*   Basic Metabolic Panel: Recent Labs  Lab 08/26/21 0750  NA 140  K 3.6  CL 108  CO2 22  GLUCOSE 135*  BUN 27*  CREATININE 1.32*  CALCIUM 8.7*   GFR: Estimated Creatinine Clearance: 49 mL/min (A) (by C-G formula  based on SCr of 1.32 mg/dL (H)). Liver Function Tests: Recent Labs  Lab 08/26/21 0750  AST 56*  ALT 93*  ALKPHOS 84  BILITOT 0.6  PROT 6.7  ALBUMIN 3.8   No results for input(s): LIPASE, AMYLASE in the last 168 hours. No results for input(s): AMMONIA in the last 168 hours. Coagulation Profile: No results for input(s): INR, PROTIME in the last 168 hours. Cardiac Enzymes: No results for input(s): CKTOTAL, CKMB, CKMBINDEX, TROPONINI in the last 168 hours. BNP (last 3 results) No results for input(s): PROBNP in the last 8760 hours. HbA1C: No results for input(s): HGBA1C in the last 72 hours. CBG: No results for input(s): GLUCAP in the last 168 hours. Lipid Profile: No results for input(s): CHOL, HDL, LDLCALC, TRIG, CHOLHDL, LDLDIRECT in the last 72 hours. Thyroid Function Tests: No results for input(s): TSH, T4TOTAL, FREET4, T3FREE, THYROIDAB in the last 72 hours. Anemia Panel: No results for input(s): VITAMINB12, FOLATE, FERRITIN, TIBC, IRON, RETICCTPCT in the last 72 hours. Urine analysis:    Component Value Date/Time   COLORURINE YELLOW 09/01/2020 2035   APPEARANCEUR HAZY (A) 09/01/2020 2035   LABSPEC 1.010 09/01/2020 2035   PHURINE 5.0 09/01/2020 2035   GLUCOSEU NEGATIVE 09/01/2020 2035   HGBUR MODERATE (A) 09/01/2020 2035   BILIRUBINUR NEGATIVE 09/01/2020 2035   KETONESUR NEGATIVE 09/01/2020 2035   PROTEINUR NEGATIVE 09/01/2020 2035   NITRITE NEGATIVE 09/01/2020 2035   LEUKOCYTESUR TRACE (A) 09/01/2020 2035   Sepsis Labs: @LABRCNTIP (procalcitonin:4,lacticidven:4) ) Recent Results (from the past 240 hour(s))  Resp Panel by RT-PCR (Flu A&B, Covid) Nasopharyngeal Swab     Status: None   Collection Time: 08/26/21  8:21 AM   Specimen: Nasopharyngeal Swab; Nasopharyngeal(NP) swabs in vial transport medium  Result Value Ref Range Status   SARS Coronavirus 2 by RT PCR NEGATIVE NEGATIVE Final    Comment: (NOTE) SARS-CoV-2 target nucleic acids are NOT DETECTED.  The  SARS-CoV-2 RNA is generally detectable in upper respiratory specimens during the acute phase of infection. The lowest concentration of SARS-CoV-2 viral copies this assay can detect is 138 copies/mL. A negative result does not preclude SARS-Cov-2 infection and should not be used as the sole basis for treatment or other patient management decisions. A negative result may occur  with  improper specimen collection/handling, submission of specimen other than nasopharyngeal swab, presence of viral mutation(s) within the areas targeted by this assay, and inadequate number of viral copies(<138 copies/mL). A negative result must be combined with clinical observations, patient history, and epidemiological information. The expected result is Negative.  Fact Sheet for Patients:  EntrepreneurPulse.com.au  Fact Sheet for Healthcare Providers:  IncredibleEmployment.be  This test is no t yet approved or cleared by the [place] FDA and  has been authorized for detection and/or diagnosis of SARS-CoV-2 by FDA under an Emergency Use Authorization (EUA). This EUA will remain  in effect (meaning this test can be used) for the duration of the COVID-19 declaration under Section 564(b)(1) of the Act, 21 U.S.C.section 360bbb-3(b)(1), unless the authorization is terminated  or revoked sooner.       Influenza A by PCR NEGATIVE NEGATIVE Final   Influenza B by PCR NEGATIVE NEGATIVE Final    Comment: (NOTE) The Xpert Xpress SARS-CoV-2/FLU/RSV plus assay is intended as an aid in the diagnosis of influenza from Nasopharyngeal swab specimens and should not be used as a sole basis for treatment. Nasal washings and aspirates are unacceptable for Xpert Xpress SARS-CoV-2/FLU/RSV testing.  Fact Sheet for Patients: EntrepreneurPulse.com.au  Fact Sheet for Healthcare Providers: IncredibleEmployment.be  This test is not yet approved or  cleared by the [place] FDA and has been authorized for detection and/or diagnosis of SARS-CoV-2 by FDA under an Emergency Use Authorization (EUA). This EUA will remain in effect (meaning this test can be used) for the duration of the COVID-19 declaration under Section 564(b)(1) of the Act, 21 U.S.C. section 360bbb-3(b)(1), unless the authorization is terminated or revoked.  Performed at [facility], [address] [place],  [address]      Radiological Exams on Admission: DG Chest 2 View  Result Date: 08/26/2021 CLINICAL DATA:  Pt complains of increased SOB since last pm. per EMS wheezing noted at home EXAM: CHEST - 2 VIEW COMPARISON:  11/20/2020 FINDINGS: New moderate airspace opacities in both lower lobes. Heart size normal.  CABG markers.  Left atrial appendage clip. Blunting of posterior costophrenic angles suggesting small effusions. No pneumothorax. Sternotomy wires. Partially calcified right scapular/chest wall mass again noted. IMPRESSION: 1. New bibasilar airspace infiltrates with possible small effusions. Electronically Signed   By: Lucrezia Europe M.D.   On: 08/26/2021 08:23     EKG: I have personally reviewed.  Sinus rhythm, QTC 470, bifascicular block, early R wave progression  Assessment/Plan Principal Problem:   Acute on chronic diastolic CHF (congestive heart failure) (HCC) Active Problems:   HTN (hypertension)   Stage 3a chronic kidney disease (HCC)   CAD (coronary artery disease)   HLD (hyperlipidemia)   GERD (gastroesophageal reflux disease)   Type II diabetes mellitus with renal manifestations (HCC)   Acute on chronic diastolic CHF (congestive heart failure) ([place]): 2D echo on 11/27/2020 showed EF 55% with grade 2 diastolic dysfunction.  Patient has leg edema, positive JVD, elevated BNP 1040, clinically consistent with CHF exacerbation.  Procalcitonin is <0.10, no fever or leukocytosis, less likely to have pneumonia.  -Will admit to progressive  unit as inpatient -Lasix 60 mg bid by IV (patient received 80 mg of Lasix in ED) -2d echo -Daily weights -strict I/O's -Low salt diet -Fluid restriction -Obtain REDs Vest reading  HTN (hypertension) -IV hydralazine as needed -Amlodipine, oral hydralazine  Stage 3a chronic kidney disease ([place]): Stable.  Recent baseline creatinine 1.62 on 08/07/2021.  His creatinine is 1.32,  BUN 27 -Monitor renal function by BMP  CAD (coronary artery disease) and elevated troponin: s/p of CABG. Troponin level 22, 34, 58.  No chest pain, most likely due to demand ischemia -Continue aspirin, Plavix, Crestor -Trend troponin -Check A1c, FLP  HLD (hyperlipidemia) -Crestor  GERD (gastroesophageal reflux disease -Protonix  Type II diabetes mellitus with renal manifestations [facility]): Recent A1c 7.8, poorly controlled.  Patient is taking Levemir -Decrease Levemir dose from 20 to 15 unit twice daily -Sliding scale insulin    DVT ppx: SQ Lovenox Code Status: Full code per pt Family Communication: I have tried to call her daughter without success, then I called her wife who did not pick up the phone.  I left a message to his wife. Disposition Plan:  Anticipate discharge back to previous environment Consults called:  none Admission status and Level of care: Progressive Cardiac:    as inpt       Status is: Inpatient  Remains inpatient appropriate because: Patient has multiple comorbidities, and old age, now presents with acute on chronic CHF exacerbation.  Also has elevated troponin 22.  Oxygen desaturation to 90s on room air.  Given his older age, patient is at high risk of deteriorating.  He needs to be treated in hospital for at least 2 days.               Date of Service 08/26/2021    Ivor Costa [facility]   If 7PM-7AM, please contact night-coverage [email or web address] 08/26/2021, 1:26 PM

## 2021-08-27 ENCOUNTER — Other Ambulatory Visit: Payer: Self-pay

## 2021-08-27 ENCOUNTER — Inpatient Hospital Stay (HOSPITAL_COMMUNITY)
Admit: 2021-08-27 | Discharge: 2021-08-27 | Disposition: A | Discharge status: Home / Self Care | Payer: Medicare HMO | Attending: Internal Medicine | Admitting: Internal Medicine

## 2021-08-27 DIAGNOSIS — E1122 Type 2 diabetes mellitus with diabetic chronic kidney disease: Secondary | ICD-10-CM

## 2021-08-27 DIAGNOSIS — N1831 Chronic kidney disease, stage 3a: Secondary | ICD-10-CM | POA: Diagnosis not present

## 2021-08-27 DIAGNOSIS — R001 Bradycardia, unspecified: Secondary | ICD-10-CM

## 2021-08-27 DIAGNOSIS — I5031 Acute diastolic (congestive) heart failure: Secondary | ICD-10-CM

## 2021-08-27 DIAGNOSIS — I25118 Atherosclerotic heart disease of native coronary artery with other forms of angina pectoris: Secondary | ICD-10-CM

## 2021-08-27 DIAGNOSIS — R0902 Hypoxemia: Secondary | ICD-10-CM | POA: Diagnosis not present

## 2021-08-27 DIAGNOSIS — N1832 Chronic kidney disease, stage 3b: Secondary | ICD-10-CM

## 2021-08-27 DIAGNOSIS — I5033 Acute on chronic diastolic (congestive) heart failure: Secondary | ICD-10-CM | POA: Diagnosis not present

## 2021-08-27 DIAGNOSIS — Z794 Long term (current) use of insulin: Secondary | ICD-10-CM

## 2021-08-27 DIAGNOSIS — E1169 Type 2 diabetes mellitus with other specified complication: Secondary | ICD-10-CM

## 2021-08-27 LAB — BASIC METABOLIC PANEL
Anion gap: 7 (ref 5–15)
BUN: 26 mg/dL — ABNORMAL HIGH (ref 8–23)
CO2: 25 mmol/L (ref 22–32)
Calcium: 8.1 mg/dL — ABNORMAL LOW (ref 8.9–10.3)
Chloride: 108 mmol/L (ref 98–111)
Creatinine, Ser: 1.51 mg/dL — ABNORMAL HIGH (ref 0.61–1.24)
GFR, Estimated: 45 mL/min — ABNORMAL LOW (ref >60)
Glucose, Bld: 118 mg/dL — ABNORMAL HIGH (ref 70–99)
Potassium: 3.6 mmol/L (ref 3.5–5.1)
Sodium: 140 mmol/L (ref 135–145)

## 2021-08-27 LAB — ECHOCARDIOGRAM COMPLETE
AR max vel: 2.48 cm2
AV Area VTI: 2.38 cm2
AV Area mean vel: 2.2 cm2
AV Mean grad: 4 mmHg
AV Peak grad: 7.3 mmHg
Ao pk vel: 1.35 m/s
Area-P 1/2: 2.16 cm2
Calc EF: 49.6 %
Height: 68 in
MV VTI: 2.3 cm2
S' Lateral: 4.27 cm
Single Plane A2C EF: 46.7 %
Single Plane A4C EF: 52.8 %
Weight: 3712 oz

## 2021-08-27 LAB — LIPID PANEL
Cholesterol: 114 mg/dL (ref 0–200)
HDL: 45 mg/dL (ref >40)
LDL Cholesterol: 53 mg/dL (ref 0–99)
Total CHOL/HDL Ratio: 2.5 RATIO
Triglycerides: 78 mg/dL (ref <150)
VLDL: 16 mg/dL (ref 0–40)

## 2021-08-27 LAB — MAGNESIUM: Magnesium: 1.8 mg/dL (ref 1.7–2.4)

## 2021-08-27 LAB — CBG MONITORING, ED
Glucose-Capillary: 118 mg/dL — ABNORMAL HIGH (ref 70–99)
Glucose-Capillary: 177 mg/dL — ABNORMAL HIGH (ref 70–99)
Glucose-Capillary: 111 mg/dL — ABNORMAL HIGH (ref 70–99)
Glucose-Capillary: 127 mg/dL — ABNORMAL HIGH (ref 70–99)

## 2021-08-27 MED ORDER — PERFLUTREN LIPID MICROSPHERE
1.0000 mL | INTRAVENOUS | Status: AC | PRN
Start: 2021-08-27 — End: 2021-08-27
  Administered 2021-08-27: 2 mL via INTRAVENOUS
  Filled 2021-08-27: qty 10

## 2021-08-27 NOTE — Progress Notes (Signed)
*  PRELIMINARY RESULTS* Echocardiogram 2D Echocardiogram has been performed.  [name] [name] 08/27/2021, 8:51 AM

## 2021-08-27 NOTE — Progress Notes (Signed)
Patient HR dropping down into 30's but does not sustain. Patient is asymptomatic. EKG obtained, MD made aware and cardio consult placed.

## 2021-08-27 NOTE — Progress Notes (Addendum)
PROGRESS NOTE    [name]  HUD:[id number] DOB: [birth year] DOA: 08/26/2021 PCP: Tomasita Morrow, MD   Assessment & Plan:   Principal Problem:   Acute on chronic diastolic CHF (congestive heart failure) ([place]) Active Problems:   HTN (hypertension)   Stage 3a chronic kidney disease (HCC)   CAD (coronary artery disease)   HLD (hyperlipidemia)   GERD (gastroesophageal reflux disease)   Type II diabetes mellitus with renal manifestations ([place])    Acute on chronic diastolic CHY:IFOY on 7/74/1287 showed EF 55% with grade II diastolic dysfunction. Has leg edema, positive JVD, elevated BNP 1040, clinically consistent with CHF exacerbation. Continue on IV lasix. Monitor I/Os and daily weights. Echo is pending   Bradycardia: w/ Mobitz I block as per cardio. Asymptomatic but drops into 30s. Home dose of amlodipine has been held. Continue on tele. Cardio consulted   HTN: hold home dose of amlodipine secondary to bradycardia. IV hydralazine prn   CKDIIIa: baseline Cr 1.62.Cr is trending down from day prior.    CAD: w/ elevated troponin. S/p of CABG. Likely secondary to demand ischemia. Continue on statin, aspirin  HLD: continue on statin    GERD: continue on PPI    DM2: HbA1c 7.8, poorly controlled. Continue on levemir, SSI w/ accuchecks   Thrombocytopenia: etiology unclear. Will continue to monitor   Morbid obesity: BMI 35.2. Complicates overall care & prognosis     DVT prophylaxis: lovenox  Code Status: full  Family Communication:  Disposition Plan: depends on PT/OT recs (not consulted yet)   Level of care: Progressive Cardiac  Status is: Inpatient  Remains inpatient appropriate because: severity of illness    Consultants:  Cardio   Procedures:  Antimicrobials:    Subjective: Pt c/o shortness of breath   Objective: Vitals:   08/27/21 0500 08/27/21 0735 08/27/21 0740 08/27/21 0755  BP: 132/60 124/85    Pulse: (!) 51 (!) 30 (!) 49 (!) 55  Resp: 20 (!)  22 15 (!) 23  Temp:      TempSrc:      SpO2: 98% 98% 98% 100%  Weight:      Height:        Intake/Output Summary (Last 24 hours) at 08/27/2021 1330 Last data filed at 08/27/2021 1303 Gross per 24 hour  Intake --  Output 2250 ml  Net -2250 ml   Filed Weights   08/26/21 0754  Weight: 105.2 kg    Examination:  General exam: Appears calm but uncomfortable  Respiratory system: diminished breath sounds b/l  Cardiovascular system: S1 & S2 +. No  rubs, gallops or clicks. No pedal edema. Gastrointestinal system: Abdomen is nondistended, soft and nontender. Normal bowel sounds heard. Central nervous system: Alert and oriented. Moves all extremities  Psychiatry: Judgement and insight appear normal. Flat mood and affect   Data Reviewed: I have personally reviewed following labs and imaging studies  CBC: Recent Labs  Lab 08/26/21 0750  WBC 6.2  HGB 15.6  HCT 48.6  MCV 92.2  PLT 867*   Basic Metabolic Panel: Recent Labs  Lab 08/26/21 0750 08/27/21 0628  NA 140 140  K 3.6 3.6  CL 108 108  CO2 22 25  GLUCOSE 135* 118*  BUN 27* 26*  CREATININE 1.32* 1.51*  CALCIUM 8.7* 8.1*  MG  --  1.8   GFR: Estimated Creatinine Clearance: 42.8 mL/min (A) (by C-G formula based on SCr of 1.51 mg/dL (H)). Liver Function Tests: Recent Labs  Lab 08/26/21 0750  AST 56*  ALT 93*  ALKPHOS 84  BILITOT 0.6  PROT 6.7  ALBUMIN 3.8   No results for input(s): LIPASE, AMYLASE in the last 168 hours. No results for input(s): AMMONIA in the last 168 hours. Coagulation Profile: No results for input(s): INR, PROTIME in the last 168 hours. Cardiac Enzymes: No results for input(s): CKTOTAL, CKMB, CKMBINDEX, TROPONINI in the last 168 hours. BNP (last 3 results) No results for input(s): PROBNP in the last 8760 hours. HbA1C: Recent Labs    08/26/21 0959  HGBA1C 7.1*   CBG: Recent Labs  Lab 08/26/21 1631 08/26/21 2140 08/27/21 0826 08/27/21 1216  GLUCAP 119* 139* 118* 177*   Lipid  Profile: Recent Labs    08/27/21 0628  CHOL 114  HDL 45  LDLCALC 53  TRIG 78  CHOLHDL 2.5   Thyroid Function Tests: No results for input(s): TSH, T4TOTAL, FREET4, T3FREE, THYROIDAB in the last 72 hours. Anemia Panel: No results for input(s): VITAMINB12, FOLATE, FERRITIN, TIBC, IRON, RETICCTPCT in the last 72 hours. Sepsis Labs: Recent Labs  Lab 08/26/21 0750  PROCALCITON <0.10    Recent Results (from the past 240 hour(s))  Resp Panel by RT-PCR (Flu A&B, Covid) Nasopharyngeal Swab     Status: None   Collection Time: 08/26/21  8:21 AM   Specimen: Nasopharyngeal Swab; Nasopharyngeal(NP) swabs in vial transport medium  Result Value Ref Range Status   SARS Coronavirus 2 by RT PCR NEGATIVE NEGATIVE Final    Comment: (NOTE) SARS-CoV-2 target nucleic acids are NOT DETECTED.  The SARS-CoV-2 RNA is generally detectable in upper respiratory specimens during the acute phase of infection. The lowest concentration of SARS-CoV-2 viral copies this assay can detect is 138 copies/mL. A negative result does not preclude SARS-Cov-2 infection and should not be used as the sole basis for treatment or other patient management decisions. A negative result may occur with  improper specimen collection/handling, submission of specimen other than nasopharyngeal swab, presence of viral mutation(s) within the areas targeted by this assay, and inadequate number of viral copies(<138 copies/mL). A negative result must be combined with clinical observations, patient history, and epidemiological information. The expected result is Negative.  Fact Sheet for Patients:  EntrepreneurPulse.com.au  Fact Sheet for Healthcare Providers:  IncredibleEmployment.be  This test is no t yet approved or cleared by the [place] FDA and  has been authorized for detection and/or diagnosis of SARS-CoV-2 by FDA under an Emergency Use Authorization (EUA). This EUA will remain  in  effect (meaning this test can be used) for the duration of the COVID-19 declaration under Section 564(b)(1) of the Act, 21 U.S.C.section 360bbb-3(b)(1), unless the authorization is terminated  or revoked sooner.       Influenza A by PCR NEGATIVE NEGATIVE Final   Influenza B by PCR NEGATIVE NEGATIVE Final    Comment: (NOTE) The Xpert Xpress SARS-CoV-2/FLU/RSV plus assay is intended as an aid in the diagnosis of influenza from Nasopharyngeal swab specimens and should not be used as a sole basis for treatment. Nasal washings and aspirates are unacceptable for Xpert Xpress SARS-CoV-2/FLU/RSV testing.  Fact Sheet for Patients: EntrepreneurPulse.com.au  Fact Sheet for Healthcare Providers: IncredibleEmployment.be  This test is not yet approved or cleared by the [place] FDA and has been authorized for detection and/or diagnosis of SARS-CoV-2 by FDA under an Emergency Use Authorization (EUA). This EUA will remain in effect (meaning this test can be used) for the duration of the COVID-19 declaration under Section 564(b)(1) of the Act, 21 U.S.C. section 360bbb-3(b)(1), unless  the authorization is terminated or revoked.  Performed at [facility], [address], [place], [place] [address]          Radiology Studies: DG Chest 2 View  Result Date: 08/26/2021 CLINICAL DATA:  Pt complains of increased SOB since last pm. per EMS wheezing noted at home EXAM: CHEST - 2 VIEW COMPARISON:  11/20/2020 FINDINGS: New moderate airspace opacities in both lower lobes. Heart size normal.  CABG markers.  Left atrial appendage clip. Blunting of posterior costophrenic angles suggesting small effusions. No pneumothorax. Sternotomy wires. Partially calcified right scapular/chest wall mass again noted. IMPRESSION: 1. New bibasilar airspace infiltrates with possible small effusions. Electronically Signed   By: Lucrezia Europe M.D.   On: 08/26/2021 08:23         Scheduled Meds:  acyclovir  800 mg Oral Daily   aspirin EC  81 mg Oral Daily   budesonide (PULMICORT) nebulizer solution  0.25 mg Nebulization BID   clopidogrel  75 mg Oral Daily   enoxaparin (LOVENOX) injection  40 mg Subcutaneous Q24H   furosemide  60 mg Intravenous BID   hydrALAZINE  25 mg Oral TID   insulin aspart  0-5 Units Subcutaneous QHS   insulin aspart  0-9 Units Subcutaneous TID WC   insulin detemir  15 Units Subcutaneous BID   magnesium oxide  400 mg Oral Daily   multivitamin with minerals  1 tablet Oral Daily   oxybutynin  5 mg Oral BID   pantoprazole  40 mg Oral BID   rosuvastatin  40 mg Oral Daily   Continuous Infusions:   LOS: 1 day    Time spent: 34 mins     Wyvonnia Dusky, MD [facility] Pager [phone]  If 7PM-7AM, please contact night-coverage 08/27/2021, 1:30 PM

## 2021-08-27 NOTE — Consult Note (Addendum)
   Heart Failure Nurse Navigator Note  HFpEf 55%.  Mild LVH.  Normal right ventricular systolic function.  Grade 2 diastolic dysfunction.  Ejection fraction had previously been reported at 40 to 45%.   He presented to the emergency room from home with complaints of several days of shortness of breath, worse on exertion, worse lower extremity edema, PND, orthopnea blood pressure 177/85 on admission.  Comorbidities:  Hypertension Hyperlipidemia Diabetes GERD Chronic kidney disease stage III Coronary artery disease/CABG   Medications:  Furosemide 60 mg twice daily Plavix 75 mg Crestor 40 mg Hydralazine 25 mg 3 times daily Aspirin 81 mg   Labs:  Sodium 140, potassium 3.6, chloride 108, CO2 25, BUN 26, creatinine 1.51 up from 1.32 of yesterday.  Cholesterol 114, triglycerides 79, HDL 45, LDL 53.     Met with patient in the ED he was lying on a gurney in no acute distress.  He states that his wheezing has improved but is still not back to baseline.  Discussed how he takes care of himself at home.  He has a scale but he does not weigh himself.  Stressed the importance of weighing daily, recording and what to report.  Also went over low-sodium diet.  He states his wife is very conscious of making sure he is eating low-sodium foods, they do not eat out at restaurants.  He feels that he sticks with the 64 ounce fluid restriction.  He is compliant with his medications.  Patient states at times he will wake up and not have any swelling in his legs and the after being up a couple hours notes that they start to swell.  Recommend wearing support stockings to see if that helps.  Made him aware of the outpatient heart failure clinic and explained that he has an appointment with Darylene Price on October 27 at 11 AM.  He has a no-show rate of 14%, 11 out of 77 appointments.  He was given the living with heart failure teaching booklet along with information on low-sodium and the zone  magnet.    He had no further questions and instructed that I would continue to follow along during his hospitalization.   Pricilla Riffle RN CHFN
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
## 2021-08-28 DIAGNOSIS — I5023 Acute on chronic systolic (congestive) heart failure: Secondary | ICD-10-CM | POA: Diagnosis not present

## 2021-08-28 LAB — CBC
HCT: 37 % — ABNORMAL LOW (ref 39.0–52.0)
Hemoglobin: 11.8 g/dL — ABNORMAL LOW (ref 13.0–17.0)
MCH: 28.9 pg (ref 26.0–34.0)
MCHC: 31.9 g/dL (ref 30.0–36.0)
MCV: 90.7 fL (ref 80.0–100.0)
Platelets: 198 10*3/uL (ref 150–400)
RBC: 4.08 MIL/uL — ABNORMAL LOW (ref 4.22–5.81)
RDW: 14 % (ref 11.5–15.5)
WBC: 6.2 10*3/uL (ref 4.0–10.5)
nRBC: 0 % (ref 0.0–0.2)

## 2021-08-28 LAB — BASIC METABOLIC PANEL
Anion gap: 5 (ref 5–15)
BUN: 25 mg/dL — ABNORMAL HIGH (ref 8–23)
CO2: 27 mmol/L (ref 22–32)
Calcium: 8.1 mg/dL — ABNORMAL LOW (ref 8.9–10.3)
Chloride: 109 mmol/L (ref 98–111)
Creatinine, Ser: 1.64 mg/dL — ABNORMAL HIGH (ref 0.61–1.24)
GFR, Estimated: 41 mL/min — ABNORMAL LOW (ref >60)
Glucose, Bld: 70 mg/dL (ref 70–99)
Potassium: 3.4 mmol/L — ABNORMAL LOW (ref 3.5–5.1)
Sodium: 141 mmol/L (ref 135–145)

## 2021-08-28 LAB — MAGNESIUM: Magnesium: 1.9 mg/dL (ref 1.7–2.4)

## 2021-08-28 LAB — GLUCOSE, CAPILLARY
Glucose-Capillary: 110 mg/dL — ABNORMAL HIGH (ref 70–99)
Glucose-Capillary: 135 mg/dL — ABNORMAL HIGH (ref 70–99)

## 2021-08-28 LAB — CBG MONITORING, ED
Glucose-Capillary: 86 mg/dL (ref 70–99)
Glucose-Capillary: 132 mg/dL — ABNORMAL HIGH (ref 70–99)

## 2021-08-28 MED ORDER — ISOSORBIDE MONONITRATE ER 30 MG PO TB24
30.0000 mg | ORAL_TABLET | Freq: Every day | ORAL | Status: DC
  Administered 2021-08-28 – 2021-09-01 (×5): 30 mg via ORAL
  Filled 2021-08-28 (×5): qty 1

## 2021-08-28 MED ORDER — POTASSIUM CHLORIDE CRYS ER 20 MEQ PO TBCR
40.0000 meq | EXTENDED_RELEASE_TABLET | Freq: Once | ORAL | Status: AC
  Administered 2021-08-28: 40 meq via ORAL
  Filled 2021-08-28: qty 2

## 2021-08-28 MED ORDER — INFLUENZA VAC A&B SA ADJ QUAD 0.5 ML IM PRSY
0.5000 mL | PREFILLED_SYRINGE | INTRAMUSCULAR | Status: AC
  Administered 2021-08-29: 0.5 mL via INTRAMUSCULAR
  Filled 2021-08-28: qty 0.5

## 2021-08-28 NOTE — Evaluation (Signed)
Physical Therapy Evaluation Patient Details Name: [name] MRN: [id number] DOB: 1935-05-15 Today's Date: 08/28/2021  History of Present Illness  86 y.o. male with medical history significant of dCHF, HTN, HLD, DM, GERD, CKD stage IIIa, CAD, CABG, cardiac arrest, who presents with shortness of breath.    Clinical Impression  Pt received in supine position and agreeable to therapy. Pt performed transfers with FWW and did so with good technique.  Pt reports he feels much more stable with walker at this time and would like to ambulate with one for time being.  Pt ambulated good distance with much more steady gait than reported by OT with no AD.  Pt then transferred back to room with MD arriving in room and pt being transferred to the floor.  Pt would benefit from d/c to home with HHPT to regain mobility and strength in order to return to work.       Recommendations for follow up therapy are one component of a multi-disciplinary discharge planning process, led by the attending physician.  Recommendations may be updated based on patient status, additional functional criteria and insurance authorization.  Follow Up Recommendations Home health PT;Supervision for mobility/OOB    Equipment Recommendations  Rolling walker with 5" wheels    Recommendations for Other Services       Precautions / Restrictions Precautions Precautions: Fall      Mobility  Bed Mobility Overal bed mobility: Modified Independent             General bed mobility comments: HOB elevated and increased time    Transfers Overall transfer level: Needs assistance Equipment used: Rolling walker (2 wheeled) Transfers: Sit to/from Stand Sit to Stand: Min guard Stand pivot transfers: Min guard          Ambulation/Gait Ambulation/Gait assistance: Min guard Gait Distance (Feet): 220 Feet Assistive device: Rolling walker (2 wheeled) Gait Pattern/deviations: Step-through pattern;Decreased stride  length Gait velocity: decreased.   General Gait Details: Pt with slow, but stable mobility with FWW.  Stairs            Wheelchair Mobility    Modified Rankin (Stroke Patients Only)       Balance Overall balance assessment: Needs assistance Sitting-balance support: Feet supported;Bilateral upper extremity supported Sitting balance-[name]: Good     Standing balance support: During functional activity Standing balance-[name]: Fair                               Pertinent Vitals/Pain Pain Assessment: No/denies pain    Home Living Family/patient expects to be discharged to:: Private residence Living Arrangements: Spouse/significant other Available Help at Discharge: Family;Available 24 hours/day Type of Home: House Home Access: Stairs to enter Entrance Stairs-Rails: None Entrance Stairs-Number of Steps: 3-4 Home Layout: One level Home Equipment: Cane - single point;Walker - 2 wheels      Prior Function Level of Independence: Independent with assistive device(s)               Hand Dominance   Dominant Hand: Right    Extremity/Trunk Assessment   Upper Extremity Assessment Upper Extremity Assessment: Overall WFL for tasks assessed    Lower Extremity Assessment Lower Extremity Assessment: Overall WFL for tasks assessed       Communication   Communication: No difficulties  Cognition Arousal/Alertness: Awake/alert Behavior During Therapy: WFL for tasks assessed/performed Overall Cognitive Status: Within Functional Limits for tasks assessed  General Comments: Pt very pleasant      General Comments      Exercises     Assessment/Plan    PT Assessment Patient needs continued PT services  PT Problem List Decreased strength;Decreased activity tolerance       PT Treatment Interventions DME instruction;Gait training;Stair training;Functional mobility training;Therapeutic  activities;Therapeutic exercise;Balance training;Neuromuscular re-education    PT Goals (Current goals can be found in the Care Plan section)  Acute Rehab PT Goals Patient Stated Goal: to go home and return to work PT Goal Formulation: With patient/family Time For Goal Achievement: 09/11/21 Potential to Achieve Goals: Good    Frequency Min 2X/week   Barriers to discharge        Co-evaluation               AM-PAC PT "6 Clicks" Mobility  Outcome Measure Help needed turning from your back to your side while in a flat bed without using bedrails?: A Little Help needed moving from lying on your back to sitting on the side of a flat bed without using bedrails?: A Little Help needed moving to and from a bed to a chair (including a wheelchair)?: A Little Help needed standing up from a chair using your arms (e.g., wheelchair or bedside chair)?: A Little Help needed to walk in hospital room?: A Little Help needed climbing 3-5 steps with a railing? : A Little 6 Click Score: 18    End of Session Equipment Utilized During Treatment: Gait belt Activity Tolerance: Patient tolerated treatment well Patient left: in bed;with call bell/phone within reach;with bed alarm set;with family/visitor present;Other (comment) (With MD in room.) Nurse Communication: Mobility status PT Visit Diagnosis: Unsteadiness on feet (R26.81);Other abnormalities of gait and mobility (R26.89);Muscle weakness (generalized) (M62.81)    Time: 6256-3893 PT Time Calculation (min) (ACUTE ONLY): 22 min   Charges:   PT Evaluation $PT Eval Low Complexity: 1 Low PT Treatments $Gait Training: 8-22 mins        Gwenlyn Saran, PT, DPT 08/28/21, 4:02 PM   Christie Nottingham 08/28/2021, 4:00 PM

## 2021-08-28 NOTE — ED Notes (Signed)
Pt used urinal. Urine measured to 545mls

## 2021-08-28 NOTE — ED Notes (Signed)
PT stated that he wants to take a shower or wash up this morning.

## 2021-08-28 NOTE — Progress Notes (Signed)
Progress Note  Patient Name: [name] Date of Encounter: 08/28/2021  Primary Cardiologist: [name], MD  Subjective   Feels like breathing is improving.  Not quite back to baseline.  Echo October 20 has shows worsening of LV function.  We discussed this today.  Inpatient Medications    Scheduled Meds:  acyclovir  800 mg Oral Daily   aspirin EC  81 mg Oral Daily   budesonide (PULMICORT) nebulizer solution  0.25 mg Nebulization BID   clopidogrel  75 mg Oral Daily   enoxaparin (LOVENOX) injection  40 mg Subcutaneous Q24H   furosemide  60 mg Intravenous BID   hydrALAZINE  25 mg Oral TID   insulin aspart  0-5 Units Subcutaneous QHS   insulin aspart  0-9 Units Subcutaneous TID WC   insulin detemir  15 Units Subcutaneous BID   magnesium oxide  400 mg Oral Daily   multivitamin with minerals  1 tablet Oral Daily   oxybutynin  5 mg Oral BID   pantoprazole  40 mg Oral BID   rosuvastatin  40 mg Oral Daily   Continuous Infusions:  PRN Meds: acetaminophen, albuterol, dextromethorphan-guaiFENesin, hydrALAZINE, ondansetron (ZOFRAN) IV   Vital Signs    Vitals:   08/28/21 0430 08/28/21 0600 08/28/21 0720 08/28/21 0726  BP: (!) 153/80  (!) 143/73   Pulse:   (!) 42 64  Resp: 14 19 (!) 22 (!) 21  Temp:      TempSrc:      SpO2:  95% 99% 99%  Weight:      Height:        Intake/Output Summary (Last 24 hours) at 08/28/2021 1107 Last data filed at 08/27/2021 1303 Gross per 24 hour  Intake --  Output 900 ml  Net -900 ml   Filed Weights   08/26/21 0754  Weight: 105.2 kg    Physical Exam   GEN: Well nourished, well developed, in no acute distress.  HEENT: Grossly normal.  Neck: Supple, moderately elevated JVP, no carotid bruits, or masses. Cardiac: Irregular, bradycardic, 2/6 systolic murmur at the right upper sternal border, no rubs, or gallops. No clubbing, cyanosis, trace bilateral lower extremity edema.  Radials 2+, DP/PT 1+ and equal bilaterally.  Respiratory:   Respirations regular and unlabored, diminished breath sounds bilaterally. GI: Obese, semifirm and protuberant.  Nontender. BS + x 4. MS: no deformity or atrophy. Skin: warm and dry, no rash. Neuro:  Strength and sensation are intact. Psych: AAOx3.  Normal affect.  Labs    Chemistry Recent Labs  Lab 08/26/21 0750 08/27/21 0628 08/28/21 0600  NA 140 140 141  K 3.6 3.6 3.4*  CL 108 108 109  CO2 22 25 27   GLUCOSE 135* 118* 70  BUN 27* 26* 25*  CREATININE 1.32* 1.51* 1.64*  CALCIUM 8.7* 8.1* 8.1*  PROT 6.7  --   --   ALBUMIN 3.8  --   --   AST 56*  --   --   ALT 93*  --   --   ALKPHOS 84  --   --   BILITOT 0.6  --   --   GFRNONAA 53* 45* 41*  ANIONGAP 10 7 5      Hematology Recent Labs  Lab 08/26/21 0750 08/28/21 0600  WBC 6.2 6.2  RBC 5.27 4.08*  HGB 15.6 11.8*  HCT 48.6 37.0*  MCV 92.2 90.7  MCH 29.6 28.9  MCHC 32.1 31.9  RDW 13.7 14.0  PLT 138* 198    Cardiac Enzymes  Recent Labs  Lab 08/26/21 0750 08/26/21 0959 08/26/21 1415 08/26/21 1630  TROPONINIHS 22* 34* 58* 59*      BNP Recent Labs  Lab 08/26/21 0750  BNP 1,040.2*    Lipids  Lab Results  Component Value Date   CHOL 114 08/27/2021   HDL 45 08/27/2021   LDLCALC 53 08/27/2021   TRIG 78 08/27/2021   CHOLHDL 2.5 08/27/2021    HbA1c  Lab Results  Component Value Date   HGBA1C 7.1 (H) 08/26/2021    Radiology    DG Chest 2 View  Result Date: 08/26/2021 CLINICAL DATA:  Pt complains of increased SOB since last pm. per EMS wheezing noted at home EXAM: CHEST - 2 VIEW COMPARISON:  11/20/2020 FINDINGS: New moderate airspace opacities in both lower lobes. Heart size normal.  CABG markers.  Left atrial appendage clip. Blunting of posterior costophrenic angles suggesting small effusions. No pneumothorax. Sternotomy wires. Partially calcified right scapular/chest wall mass again noted. IMPRESSION: 1. New bibasilar airspace infiltrates with possible small effusions. Electronically Signed   By: Lucrezia Europe M.D.   On: 08/26/2021 08:23   Telemetry    Sinus bradycardia to sinus rhythm with Mobitz 1 heart block and episodic 2:1 heart block with junctional escape's.  Rates sometimes down in the 30s during typical hours of sleep- Personally Reviewed  Cardiac Studies   2D Echocardiogram 1.2022   1. Challenging images   2. Left ventricular ejection fraction, by estimation, is 55 %. The left  ventricle has normal function. The left ventricle has no regional wall  motion abnormalities. There is mild left ventricular hypertrophy. Left  ventricular diastolic parameters are  consistent with Grade II diastolic dysfunction (pseudonormalization).   3. Right ventricular systolic function is normal. The right ventricular  size is normal. Tricuspid regurgitation signal is inadequate for assessing  PA pressure.  _____________   2D Echocardiogram 10.20.2022  1. Left ventricular ejection fraction, by estimation, is 35 to 40%. The  left ventricle has moderately decreased function. The left ventricle  demonstrates global hypokinesis. Concern for anterior, anteroseptal and  apical hypokinesis. The left ventricular   internal cavity size was mildly dilated. Left ventricular diastolic  parameters are indeterminate.   2. Right ventricular systolic function is grossly moderately reduced  though difficult to visualize. The right ventricular size is mildly  enlarged.   3. Left atrial size was mildly dilated.   4. The mitral valve is normal in structure. No evidence of mitral valve  regurgitation. No evidence of mitral stenosis.   5. The aortic valve is normal in structure. Aortic valve regurgitation is  not visualized. No aortic stenosis is present.   6. The inferior vena cava is dilated in size with <50% respiratory  variability, suggesting right atrial pressure of 15 mmHg.   7. Bradycardia noted   8. Challenging images   Patient Profile     [name] is a 86 y.o. 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  Assessment & Plan    1.  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  He required brief escalation of outpatient Lasix dose due to volume excess in September but subsequently  did well until October 16, when he started noticing worsening dyspnea and weight gain.  Present on October 19 and found to be volume overloaded.  He is responded reasonably well to IV Lasix by his report and has noted good urine output, though intake not recorded.  BUN stable.  Creat climbing sl, though essentially returning to prior baseline.  Echo October 20 shows worsening of LV function, down to 35-40% with global hypokinesis concern for anterior, anteroseptal, and apical hypokinesis.  We discussed this today.  In light of history of coronary disease worsening LV function and heart failure, ideally would pursue diagnostic catheterization to evaluate coronary anatomy.  He remains volume overloaded.  Continue intravenous Lasix.  Provided renal function remained stable following diuresis, will likely plan to pursue diagnostic catheterization next week.  No beta-blocker in the setting of baseline bradycardia with Mobitz 1 heart block and intermittent 2-1 heart block.  No ACE/ARB/ARN I/MRA in the setting of stage III chronic kidney disease with creatinine 1.64 this morning, along with prior history of hyperkalemia on losartan and spironolactone.  Continue afterload reduction with hydralazine and I will add isosorbide.  2.  Bradycardia/Mobitz 1 heart block/intermittent 2-1 heart block with junctional escape:  He has a relatively long history of Mobitz 1 and) branch block, along with bradycardia.  Rate slowed into the 30s at time, typically during hours of sleep, though he is not sure if he is sleeping or not.  Continue to avoid AV nodal blocking agents.  He will need an outpatient sleep study.  Question potential role of bradycardia and contribution to heart failure symptoms and worsening LV function.  We will plan to have electrophysiology evaluate him early next week.  He denies any history of presyncope or syncope.  3.  Coronary artery disease: Status post VF arrest in October 2021 with subsequent CABG x5.  He has not been having any chest pain.  High-sensitivity troponins are minimally elevated with a flat trend 22-34-58-59.  Suspect demand ischemia in the setting of 1, though worsening LV function is concerning for worsening disease/graft closure.  Continue aspirin, Plavix, and statin therapy.  As above, will plan to pursue diagnostic catheterization next week provided that renal function is stable.  4.  Essential hypertension: Blood pressure trending 140s to 150s.  Continue current dose of hydralazine and will add isosorbide for additional afterload reduction.  From there, we will plan to continue to titrate hydralazine if necessary.  5.  Hyperlipidemia: LDL 53.  Continue statin therapy.  6.  Stage III chronic kidney disease: Creatinine 1.64 this morning which is essentially returning to prior baseline.  Continue to follow with diuresis.  7.  History of hyperkalemia/hypokalemia: This occurred in August in the setting of losartan and spironolactone therapy, and has since resolved.  K is only 3.4 this morning and will supplement.  8.  Type 2 diabetes mellitus: A1c 7.8 in August.  Insulin therapy per medicine.  Not an ideal candidate for SGLT2 inhibitor due to declining renal function.  Signed, Murray Hodgkins, NP  08/28/2021, 11:07 AM    For questions or updates, please contact   Please consult  [email or web address] for contact info under Cardiology/STEMI.

## 2021-08-28 NOTE — Progress Notes (Signed)
PROGRESS NOTE    [name]  PNT:[id number] DOB: 1934/11/18 DOA: 08/26/2021 PCP: Tomasita Morrow, MD   Assessment & Plan:   Principal Problem:   Acute on chronic diastolic CHF (congestive heart failure) ([place]) Active Problems:   HTN (hypertension)   Stage 3a chronic kidney disease (HCC)   CAD (coronary artery disease)   HLD (hyperlipidemia)   GERD (gastroesophageal reflux disease)   Type II diabetes mellitus with renal manifestations (HCC)    Acute on chronic diastolic CHF: repeat echo shows EF 35-40%, LV global hypokinesis, diastolic function was indeterminate.  Has leg edema, positive JVD, elevated BNP 1040, clinically consistent with CHF exacerbation. Continue on IV lasix. Monitor I/Os and daily weights. May need right and left heart cath once kidney function has stabilized & volume status improved as per cardio   Bradycardia: w/ Mobitz I block as per cardio. Asymptomatic but drops into 30s. Continue to hold CCB. Continue on tele. Cardio following and recs apprec   HTN: started on imdur, hydralazine as per cardio. IV hydralazine prn   CKDIIIa: baseline Cr 1.62. Cr is trending up from day prior.    CAD: w/ elevated troponin. S/p of CABG. Likely secondary to demand ischemia. Continue on statin, aspirin  HLD: continue on statin    GERD: continue on PPI    DM2: poorly controlled, HbA1c 7.8. Continue on levemir, SSI w/ accuchecks.   Thrombocytopenia: resolved    Morbid obesity: BMI 08.6 Complicates overall care & prognosis      DVT prophylaxis: lovenox  Code Status: full  Family Communication:  Disposition Plan: likely d/c back home   Level of care: Progressive Cardiac  Status is: Inpatient  Remains inpatient appropriate because: severity of illness    Consultants:  Cardio   Procedures:  Antimicrobials:    Subjective: Pt c/o shortness of breath slightly improved from day prior   Objective: Vitals:   08/28/21 0430 08/28/21 0600 08/28/21 0720  08/28/21 0726  BP: (!) 153/80  (!) 143/73   Pulse:   (!) 42 64  Resp: 14 19 (!) 22 (!) 21  Temp:      TempSrc:      SpO2:  95% 99% 99%  Weight:      Height:        Intake/Output Summary (Last 24 hours) at 08/28/2021 0905 Last data filed at 08/27/2021 1303 Gross per 24 hour  Intake --  Output 900 ml  Net -900 ml   Filed Weights   08/26/21 0754  Weight: 105.2 kg    Examination:  General exam: Appears comfortable  Respiratory system: decreased breath sounds b/l Cardiovascular system: S1/S2+. No rubs or gallops  Gastrointestinal system: Abd is soft, NT, ND & hypoactive bowel sounds  Central nervous system: alert and oriented. Moves all extremities  Psychiatry: judgement and insight appear normal. Flat mood and affect    Data Reviewed: I have personally reviewed following labs and imaging studies  CBC: Recent Labs  Lab 08/26/21 0750 08/28/21 0600  WBC 6.2 6.2  HGB 15.6 11.8*  HCT 48.6 37.0*  MCV 92.2 90.7  PLT 138* 761   Basic Metabolic Panel: Recent Labs  Lab 08/26/21 0750 08/27/21 0628 08/28/21 0600  NA 140 140 141  K 3.6 3.6 3.4*  CL 108 108 109  CO2 22 25 27   GLUCOSE 135* 118* 70  BUN 27* 26* 25*  CREATININE 1.32* 1.51* 1.64*  CALCIUM 8.7* 8.1* 8.1*  MG  --  1.8 1.9   GFR: Estimated Creatinine Clearance:  39.4 mL/min (A) (by C-G formula based on SCr of 1.64 mg/dL (H)). Liver Function Tests: Recent Labs  Lab 08/26/21 0750  AST 56*  ALT 93*  ALKPHOS 84  BILITOT 0.6  PROT 6.7  ALBUMIN 3.8   No results for input(s): LIPASE, AMYLASE in the last 168 hours. No results for input(s): AMMONIA in the last 168 hours. Coagulation Profile: No results for input(s): INR, PROTIME in the last 168 hours. Cardiac Enzymes: No results for input(s): CKTOTAL, CKMB, CKMBINDEX, TROPONINI in the last 168 hours. BNP (last 3 results) No results for input(s): PROBNP in the last 8760 hours. HbA1C: Recent Labs    08/26/21 0959  HGBA1C 7.1*   CBG: Recent Labs   Lab 08/27/21 0826 08/27/21 1216 08/27/21 1723 08/27/21 2241 08/28/21 0752  GLUCAP 118* 177* 111* 127* 86   Lipid Profile: Recent Labs    08/27/21 0628  CHOL 114  HDL 45  LDLCALC 53  TRIG 78  CHOLHDL 2.5   Thyroid Function Tests: No results for input(s): TSH, T4TOTAL, FREET4, T3FREE, THYROIDAB in the last 72 hours. Anemia Panel: No results for input(s): VITAMINB12, FOLATE, FERRITIN, TIBC, IRON, RETICCTPCT in the last 72 hours. Sepsis Labs: Recent Labs  Lab 08/26/21 0750  PROCALCITON <0.10    Recent Results (from the past 240 hour(s))  Resp Panel by RT-PCR (Flu A&B, Covid) Nasopharyngeal Swab     Status: None   Collection Time: 08/26/21  8:21 AM   Specimen: Nasopharyngeal Swab; Nasopharyngeal(NP) swabs in vial transport medium  Result Value Ref Range Status   SARS Coronavirus 2 by RT PCR NEGATIVE NEGATIVE Final    Comment: (NOTE) SARS-CoV-2 target nucleic acids are NOT DETECTED.  The SARS-CoV-2 RNA is generally detectable in upper respiratory specimens during the acute phase of infection. The lowest concentration of SARS-CoV-2 viral copies this assay can detect is 138 copies/mL. A negative result does not preclude SARS-Cov-2 infection and should not be used as the sole basis for treatment or other patient management decisions. A negative result may occur with  improper specimen collection/handling, submission of specimen other than nasopharyngeal swab, presence of viral mutation(s) within the areas targeted by this assay, and inadequate number of viral copies(<138 copies/mL). A negative result must be combined with clinical observations, patient history, and epidemiological information. The expected result is Negative.  Fact Sheet for Patients:  EntrepreneurPulse.com.au  Fact Sheet for Healthcare Providers:  IncredibleEmployment.be  This test is no t yet approved or cleared by the [place] FDA and  has been authorized  for detection and/or diagnosis of SARS-CoV-2 by FDA under an Emergency Use Authorization (EUA). This EUA will remain  in effect (meaning this test can be used) for the duration of the COVID-19 declaration under Section 564(b)(1) of the Act, 21 U.S.C.section 360bbb-3(b)(1), unless the authorization is terminated  or revoked sooner.       Influenza A by PCR NEGATIVE NEGATIVE Final   Influenza B by PCR NEGATIVE NEGATIVE Final    Comment: (NOTE) The Xpert Xpress SARS-CoV-2/FLU/RSV plus assay is intended as an aid in the diagnosis of influenza from Nasopharyngeal swab specimens and should not be used as a sole basis for treatment. Nasal washings and aspirates are unacceptable for Xpert Xpress SARS-CoV-2/FLU/RSV testing.  Fact Sheet for Patients: EntrepreneurPulse.com.au  Fact Sheet for Healthcare Providers: IncredibleEmployment.be  This test is not yet approved or cleared by the [place] FDA and has been authorized for detection and/or diagnosis of SARS-CoV-2 by FDA under an Emergency Use Authorization (EUA). This  EUA will remain in effect (meaning this test can be used) for the duration of the COVID-19 declaration under Section 564(b)(1) of the Act, 21 U.S.C. section 360bbb-3(b)(1), unless the authorization is terminated or revoked.  Performed at [facility], [address] [place], [place] [address]          Radiology Studies: ECHOCARDIOGRAM COMPLETE  Result Date: 08/27/2021    ECHOCARDIOGRAM REPORT   Patient Name:   [name] Date of Exam: 08/27/2021 Medical Rec #:  [id number]    Height:       68.0 in Accession #:    [id number]   Weight:       232.0 lb Date of Birth:  08-05-1935   BSA:          2.177 m Patient Age:    8 years     BP:           124/85 mmHg Patient Gender: M            HR:           49 bpm. Exam Location:  [facility] Procedure: 2D Echo, Color Doppler, Cardiac Doppler and Intracardiac            Opacification  Agent Indications:     I50.31 congestive heart failure-Acute Diastolic  History:         Patient has prior history of Echocardiogram examinations, most                  recent 11/27/2020. CAD, Prior CABG, CKD,                  Signs/Symptoms:Shortness of Breath; Risk Factors:Hypertension,                  Diabetes and Dyslipidemia.  Sonographer:     Charmayne Sheer Referring Phys:  [place] Diagnosing Phys: Ida Rogue MD  Sonographer Comments: Technically difficult study due to poor echo windows. IMPRESSIONS  1. Left ventricular ejection fraction, by estimation, is 35 to 40%. The left ventricle has moderately decreased function. The left ventricle demonstrates global hypokinesis. Concern for anterior, anteroseptal and apical hypokinesis. The left ventricular  internal cavity size was mildly dilated. Left ventricular diastolic parameters are indeterminate.  2. Right ventricular systolic function is grossly moderately reduced though difficult to visualize. The right ventricular size is mildly enlarged.  3. Left atrial size was mildly dilated.  4. The mitral valve is normal in structure. No evidence of mitral valve regurgitation. No evidence of mitral stenosis.  5. The aortic valve is normal in structure. Aortic valve regurgitation is not visualized. No aortic stenosis is present.  6. The inferior vena cava is dilated in size with <50% respiratory variability, suggesting right atrial pressure of 15 mmHg.  7. Bradycardia noted  8. Challenging images FINDINGS  Left Ventricle: Left ventricular ejection fraction, by estimation, is 35 to 40%. The left ventricle has moderately decreased function. The left ventricle demonstrates global hypokinesis. The left ventricular internal cavity size was mildly dilated. There is no left ventricular hypertrophy. Left ventricular diastolic parameters are indeterminate. Right Ventricle: The right ventricular size is mildly enlarged. No increase in right ventricular wall thickness.  Right ventricular systolic function is moderately reduced. Left Atrium: Left atrial size was mildly dilated. Right Atrium: Right atrial size was normal in size. Pericardium: There is no evidence of pericardial effusion. Mitral Valve: The mitral valve is normal in structure. No evidence of mitral valve regurgitation. No evidence of mitral valve  stenosis. MV peak gradient, 3.0 mmHg. The mean mitral valve gradient is 1.0 mmHg. Tricuspid Valve: The tricuspid valve is normal in structure. Tricuspid valve regurgitation is mild . No evidence of tricuspid stenosis. Aortic Valve: The aortic valve is normal in structure. Aortic valve regurgitation is not visualized. No aortic stenosis is present. Aortic valve mean gradient measures 4.0 mmHg. Aortic valve peak gradient measures 7.3 mmHg. Aortic valve area, by VTI measures 2.38 cm. Pulmonic Valve: The pulmonic valve was normal in structure. Pulmonic valve regurgitation is not visualized. No evidence of pulmonic stenosis. Aorta: The aortic root is normal in size and structure. Venous: The inferior vena cava is dilated in size with less than 50% respiratory variability, suggesting right atrial pressure of 15 mmHg. IAS/Shunts: No atrial level shunt detected by color flow Doppler.  LEFT VENTRICLE PLAX 2D LVIDd:         5.48 cm      Diastology LVIDs:         4.27 cm      LV e' medial:    7.62 cm/s LV PW:         1.16 cm      LV E/e' medial:  11.0 LV IVS:        1.02 cm      LV e' lateral:   6.64 cm/s LVOT diam:     2.10 cm      LV E/e' lateral: 12.7 LV SV:         73 LV SV Index:   34 LVOT Area:     3.46 cm  LV Volumes (MOD) LV vol d, MOD A2C: 131.0 ml LV vol d, MOD A4C: 123.0 ml LV vol s, MOD A2C: 69.8 ml LV vol s, MOD A4C: 58.0 ml LV SV MOD A2C:     61.2 ml LV SV MOD A4C:     123.0 ml LV SV MOD BP:      63.4 ml RIGHT VENTRICLE RV Basal diam:  4.62 cm LEFT ATRIUM             Index        RIGHT ATRIUM           Index LA diam:        4.30 cm 1.98 cm/m   RA Area:     17.00 cm LA  Vol (A2C):   62.4 ml 28.67 ml/m  RA Volume:   50.60 ml  23.24 ml/m LA Vol (A4C):   32.6 ml 14.98 ml/m LA Biplane Vol: 50.0 ml 22.97 ml/m  AORTIC VALVE                    PULMONIC VALVE AV Area (Vmax):    2.48 cm     PV Vmax:       0.96 m/s AV Area (Vmean):   2.20 cm     PV Vmean:      61.400 cm/s AV Area (VTI):     2.38 cm     PV VTI:        0.203 m AV Vmax:           135.00 cm/s  PV Peak grad:  3.7 mmHg AV Vmean:          89.800 cm/s  PV Mean grad:  2.0 mmHg AV VTI:            0.307 m AV Peak Grad:      7.3 mmHg AV Mean Grad:  4.0 mmHg LVOT Vmax:         96.60 cm/s LVOT Vmean:        57.000 cm/s LVOT VTI:          0.211 m LVOT/AV VTI ratio: 0.69  AORTA Ao Root diam: 3.40 cm MITRAL VALVE               TRICUSPID VALVE MV Area (PHT): 2.16 cm    TR Peak grad:   20.2 mmHg MV Area VTI:   2.30 cm    TR Vmax:        225.00 cm/s MV Peak grad:  3.0 mmHg MV Mean grad:  1.0 mmHg    SHUNTS MV Vmax:       0.86 m/s    Systemic VTI:  0.21 m MV Vmean:      55.3 cm/s   Systemic Diam: 2.10 cm MV Decel Time: 352 msec MV E velocity: 84.20 cm/s MV A velocity: 29.80 cm/s MV E/A ratio:  2.83 Ida Rogue MD Electronically signed by Ida Rogue MD Signature Date/Time: 08/27/2021/9:13:15 PM    Final         Scheduled Meds:  acyclovir  800 mg Oral Daily   aspirin EC  81 mg Oral Daily   budesonide (PULMICORT) nebulizer solution  0.25 mg Nebulization BID   clopidogrel  75 mg Oral Daily   enoxaparin (LOVENOX) injection  40 mg Subcutaneous Q24H   furosemide  60 mg Intravenous BID   hydrALAZINE  25 mg Oral TID   insulin aspart  0-5 Units Subcutaneous QHS   insulin aspart  0-9 Units Subcutaneous TID WC   insulin detemir  15 Units Subcutaneous BID   magnesium oxide  400 mg Oral Daily   multivitamin with minerals  1 tablet Oral Daily   oxybutynin  5 mg Oral BID   pantoprazole  40 mg Oral BID   rosuvastatin  40 mg Oral Daily   Continuous Infusions:   LOS: 2 days    Time spent: 30 mins     Wyvonnia Dusky, MD [facility] Pager [phone]  If 7PM-7AM, please contact night-coverage 08/28/2021, 9:05 AM

## 2021-08-28 NOTE — Evaluation (Signed)
Occupational Therapy Evaluation Patient Details Name: [name] MRN: [id number] DOB: 10-07-1935 Today's Date: 08/28/2021   History of Present Illness 86 y.o. male with medical history significant of dCHF, HTN, HLD, DM, GERD, CKD stage IIIa, CAD, CABG, cardiac arrest, who presents with shortness of breath.   Clinical Impression   Patient presenting with decreased Ind in self care, balance, functional mobility/transfers, endurance, and safety awareness. Patient reports living at home with wife independently and working as school cross guard. Patient currently functioning at supervision - min guard without use of AD. Pt very pleasant and motivated. Pt was able to be placed on RA and remained above 95% during session. Patient will benefit from acute OT to increase overall independence in the areas of ADLs, functional mobility, and safety awareness in order to safely discharge to next venue of care.      Recommendations for follow up therapy are one component of a multi-disciplinary discharge planning process, led by the attending physician.  Recommendations may be updated based on patient status, additional functional criteria and insurance authorization.   Follow Up Recommendations  Home health OT;Supervision - Intermittent    Equipment Recommendations  Other (comment) (RW for energy conservation)       Precautions / Restrictions Precautions Precautions: Fall      Mobility Bed Mobility Overal bed mobility: Modified Independent             General bed mobility comments: HOB elevated and increased time    Transfers Overall transfer level: Needs assistance   Transfers: Sit to/from Stand;Stand Pivot Transfers Sit to Stand: Min guard Stand pivot transfers: Min guard            Balance Overall balance assessment: Needs assistance Sitting-balance support: Feet supported;Bilateral upper extremity supported Sitting balance-[name]: Good     Standing balance support:  During functional activity Standing balance-[name]: Fair                             ADL either performed or assessed with clinical judgement   ADL Overall ADL's : Needs assistance/impaired                 Upper Body Dressing : Set up;Sitting   Lower Body Dressing: Minimal assistance   Toilet Transfer: Min guard Toilet Transfer Details (indicate cue type and reason): simulated         Functional mobility during ADLs: Min guard       Vision Patient Visual Report: No change from baseline              Pertinent Vitals/Pain Pain Assessment: No/denies pain     Hand Dominance Right   Extremity/Trunk Assessment Upper Extremity Assessment Upper Extremity Assessment: Overall WFL for tasks assessed   Lower Extremity Assessment Lower Extremity Assessment: Overall WFL for tasks assessed       Communication Communication Communication: No difficulties   Cognition Arousal/Alertness: Awake/alert Behavior During Therapy: WFL for tasks assessed/performed Overall Cognitive Status: Within Functional Limits for tasks assessed                                 General Comments: Pt very pleasant              Home Living Family/patient expects to be discharged to:: Private residence Living Arrangements: Spouse/significant other Available Help at Discharge: Family;Available 24 hours/day Type of Home: House Home Access:  Stairs to enter [name] of Steps: 3-4 Entrance Stairs-Rails: None Home Layout: One level     Bathroom Shower/Tub: Occupational psychologist: Standard     Home Equipment: Cane - single point;Walker - 2 wheels          Prior Functioning/Environment Level of Independence: Independent with assistive device(s)                 OT Problem List: Decreased strength;Decreased activity tolerance;Impaired balance (sitting and/or standing);Decreased safety awareness      OT  Treatment/Interventions: Self-care/ADL training;Manual therapy;Therapeutic exercise;Modalities;Patient/family education;Balance training;Energy conservation;Therapeutic activities;DME and/or AE instruction    OT Goals(Current goals can be found in the care plan section) Acute Rehab OT Goals Patient Stated Goal: to go home and return to work OT Goal Formulation: With patient Time For Goal Achievement: 09/11/21 Potential to Achieve Goals: Good ADL Goals Pt Will Perform Grooming: Independently;standing Pt Will Perform Lower Body Dressing: sit to/from stand;Independently Pt Will Transfer to Toilet: Independently;ambulating Pt Will Perform Toileting - Clothing Manipulation and hygiene: Independently;sit to/from stand  OT Frequency: Min 2X/week   Barriers to D/C:    none known at this time          AM-PAC OT "6 Clicks" Daily Activity     Outcome Measure Help from another person eating meals?: None Help from another person taking care of personal grooming?: None Help from another person toileting, which includes using toliet, bedpan, or urinal?: A Little Help from another person bathing (including washing, rinsing, drying)?: A Little Help from another person to put on and taking off regular upper body clothing?: None Help from another person to put on and taking off regular lower body clothing?: A Little 6 Click Score: 21   End of Session Nurse Communication: Mobility status;Other (comment) (Pt placed on RA)  Activity Tolerance: Patient tolerated treatment well Patient left: in bed;with call bell/phone within reach  OT Visit Diagnosis: Unsteadiness on feet (R26.81);Muscle weakness (generalized) (M62.81)                Time: 2703-5009 OT Time Calculation (min): 22 min Charges:  OT General Charges $OT Visit: 1 Visit OT Evaluation $OT Eval Low Complexity: 1 Low OT Treatments $Self Care/Home Management : 8-22 mins  Darleen Crocker, MS, OTR/L , CBIS ascom [phone]  08/28/21,  3:32 PM

## 2021-08-29 DIAGNOSIS — I5023 Acute on chronic systolic (congestive) heart failure: Secondary | ICD-10-CM | POA: Diagnosis not present

## 2021-08-29 DIAGNOSIS — R0902 Hypoxemia: Secondary | ICD-10-CM | POA: Diagnosis not present

## 2021-08-29 DIAGNOSIS — I10 Essential (primary) hypertension: Secondary | ICD-10-CM

## 2021-08-29 DIAGNOSIS — I25118 Atherosclerotic heart disease of native coronary artery with other forms of angina pectoris: Secondary | ICD-10-CM | POA: Diagnosis not present

## 2021-08-29 LAB — GLUCOSE, CAPILLARY
Glucose-Capillary: 109 mg/dL — ABNORMAL HIGH (ref 70–99)
Glucose-Capillary: 151 mg/dL — ABNORMAL HIGH (ref 70–99)
Glucose-Capillary: 149 mg/dL — ABNORMAL HIGH (ref 70–99)
Glucose-Capillary: 123 mg/dL — ABNORMAL HIGH (ref 70–99)

## 2021-08-29 LAB — CBC
HCT: 35.2 % — ABNORMAL LOW (ref 39.0–52.0)
Hemoglobin: 11.4 g/dL — ABNORMAL LOW (ref 13.0–17.0)
MCH: 29.5 pg (ref 26.0–34.0)
MCHC: 32.4 g/dL (ref 30.0–36.0)
MCV: 91 fL (ref 80.0–100.0)
Platelets: 171 10*3/uL (ref 150–400)
RBC: 3.87 MIL/uL — ABNORMAL LOW (ref 4.22–5.81)
RDW: 14 % (ref 11.5–15.5)
WBC: 5 10*3/uL (ref 4.0–10.5)
nRBC: 0 % (ref 0.0–0.2)

## 2021-08-29 LAB — BASIC METABOLIC PANEL
Anion gap: 9 (ref 5–15)
BUN: 27 mg/dL — ABNORMAL HIGH (ref 8–23)
CO2: 27 mmol/L (ref 22–32)
Calcium: 8.1 mg/dL — ABNORMAL LOW (ref 8.9–10.3)
Chloride: 104 mmol/L (ref 98–111)
Creatinine, Ser: 1.77 mg/dL — ABNORMAL HIGH (ref 0.61–1.24)
GFR, Estimated: 37 mL/min — ABNORMAL LOW (ref >60)
Glucose, Bld: 111 mg/dL — ABNORMAL HIGH (ref 70–99)
Potassium: 3.5 mmol/L (ref 3.5–5.1)
Sodium: 140 mmol/L (ref 135–145)

## 2021-08-29 LAB — MAGNESIUM: Magnesium: 1.9 mg/dL (ref 1.7–2.4)

## 2021-08-29 MED ORDER — SODIUM CHLORIDE 0.9% FLUSH
3.0000 mL | Freq: Two times a day (BID) | INTRAVENOUS | Status: DC
Start: 2021-08-29 — End: 2021-09-01
  Administered 2021-08-29 – 2021-09-01 (×7): 3 mL via INTRAVENOUS

## 2021-08-29 MED ORDER — PHENOL 1.4 % MT LIQD
1.0000 | OROMUCOSAL | Status: DC | PRN
  Administered 2021-08-29: 1 via OROMUCOSAL
  Filled 2021-08-29 (×2): qty 177

## 2021-08-29 MED ORDER — POTASSIUM CHLORIDE CRYS ER 20 MEQ PO TBCR
40.0000 meq | EXTENDED_RELEASE_TABLET | Freq: Once | ORAL | Status: DC
  Filled 2021-08-29: qty 2

## 2021-08-29 MED ORDER — BENZONATATE 100 MG PO CAPS: 200.0000 mg | ORAL_CAPSULE | Freq: Three times a day (TID) | ORAL | Status: DC | PRN

## 2021-08-29 NOTE — Progress Notes (Signed)
Progress Note  Patient Name: [name] Date of Encounter: 08/29/2021  Primary Cardiologist: [name], MD  Subjective   Breathing improved, though he does not think it is quite back to baseline.  He does feel like his abdominal girth and lower extremity swelling is back to baseline.  Coughing quite a bit while eating breakfast this morning.  Thinks something went down wrong.  Chest pain.  Inpatient Medications    Scheduled Meds:  acyclovir  800 mg Oral Daily   aspirin EC  81 mg Oral Daily   budesonide (PULMICORT) nebulizer solution  0.25 mg Nebulization BID   clopidogrel  75 mg Oral Daily   enoxaparin (LOVENOX) injection  40 mg Subcutaneous Q24H   hydrALAZINE  25 mg Oral TID   influenza vaccine adjuvanted  0.5 mL Intramuscular Tomorrow-1000   insulin aspart  0-5 Units Subcutaneous QHS   insulin aspart  0-9 Units Subcutaneous TID WC   insulin detemir  15 Units Subcutaneous BID   isosorbide mononitrate  30 mg Oral Daily   magnesium oxide  400 mg Oral Daily   multivitamin with minerals  1 tablet Oral Daily   oxybutynin  5 mg Oral BID   pantoprazole  40 mg Oral BID   potassium chloride  40 mEq Oral Once   rosuvastatin  40 mg Oral Daily   Continuous Infusions:  PRN Meds: acetaminophen, albuterol, benzonatate, dextromethorphan-guaiFENesin, hydrALAZINE, ondansetron (ZOFRAN) IV, phenol   Vital Signs    Vitals:   08/29/21 0421 08/29/21 0522 08/29/21 0729 08/29/21 0758  BP: 105/62  (!) 136/51   Pulse: (!) 57  71   Resp: 18  18   Temp: 98.4 F (36.9 C)  98.1 F (36.7 C)   TempSrc:      SpO2: 100%  96% 95%  Weight:  104.4 kg    Height:        Intake/Output Summary (Last 24 hours) at 08/29/2021 0932 Last data filed at 08/29/2021 0728 Gross per 24 hour  Intake --  Output 1350 ml  Net -1350 ml   Filed Weights   08/26/21 0754 08/29/21 0522  Weight: 105.2 kg 104.4 kg    Physical Exam   GEN: Obese, in no acute distress.  HEENT: Grossly normal.  Neck:  Supple, no JVD, carotid bruits, or masses. Cardiac: Irregular, bradycardic, no murmurs, rubs, or gallops. No clubbing, cyanosis, trace right ankle edema.  Radials 2+, DP/PT 1+ and equal bilaterally.  Respiratory:  Respirations regular and unlabored, expiratory wheezing throughout.  No crackles. GI: Obese, softer than on prior days.  Nontender, BS + x 4. MS: no deformity or atrophy. Skin: warm and dry, no rash. Neuro:  Strength and sensation are intact. Psych: AAOx3.  Normal affect.  Labs    Chemistry Recent Labs  Lab 08/26/21 0750 08/27/21 0628 08/28/21 0600 08/29/21 0603  NA 140 140 141 140  K 3.6 3.6 3.4* 3.5  CL 108 108 109 104  CO2 22 25 27 27   GLUCOSE 135* 118* 70 111*  BUN 27* 26* 25* 27*  CREATININE 1.32* 1.51* 1.64* 1.77*  CALCIUM 8.7* 8.1* 8.1* 8.1*  PROT 6.7  --   --   --   ALBUMIN 3.8  --   --   --   AST 56*  --   --   --   ALT 93*  --   --   --   ALKPHOS 84  --   --   --   BILITOT 0.6  --   --   --  GFRNONAA 53* 45* 41* 37*  ANIONGAP 10 7 5 9      Hematology Recent Labs  Lab 08/26/21 0750 08/28/21 0600 08/29/21 0603  WBC 6.2 6.2 5.0  RBC 5.27 4.08* 3.87*  HGB 15.6 11.8* 11.4*  HCT 48.6 37.0* 35.2*  MCV 92.2 90.7 91.0  MCH 29.6 28.9 29.5  MCHC 32.1 31.9 32.4  RDW 13.7 14.0 14.0  PLT 138* 198 171    Cardiac Enzymes  Recent Labs  Lab 08/26/21 0750 08/26/21 0959 08/26/21 1415 08/26/21 1630  TROPONINIHS 22* 34* 58* 59*      BNP Recent Labs  Lab 08/26/21 0750  BNP 1,040.2*    Lipids  Lab Results  Component Value Date   CHOL 114 08/27/2021   HDL 45 08/27/2021   LDLCALC 53 08/27/2021   TRIG 78 08/27/2021   CHOLHDL 2.5 08/27/2021    HbA1c  Lab Results  Component Value Date   HGBA1C 7.1 (H) 08/26/2021    Radiology    DG Chest 2 View  Result Date: 08/26/2021 CLINICAL DATA:  Pt complains of increased SOB since last pm. per EMS wheezing noted at home EXAM: CHEST - 2 VIEW COMPARISON:  11/20/2020 FINDINGS: New moderate airspace  opacities in both lower lobes. Heart size normal.  CABG markers.  Left atrial appendage clip. Blunting of posterior costophrenic angles suggesting small effusions. No pneumothorax. Sternotomy wires. Partially calcified right scapular/chest wall mass again noted. IMPRESSION: 1. New bibasilar airspace infiltrates with possible small effusions. Electronically Signed   By: Lucrezia Europe M.D.   On: 08/26/2021 08:23   Telemetry    Sinus brady, mobitz I, intermittent 2:1 HB w/ jxnl escape - Personally Reviewed  Cardiac Studies   2D Echocardiogram 1.2022    1. Challenging images   2. Left ventricular ejection fraction, by estimation, is 55 %. The left  ventricle has normal function. The left ventricle has no regional wall  motion abnormalities. There is mild left ventricular hypertrophy. Left  ventricular diastolic parameters are  consistent with Grade II diastolic dysfunction (pseudonormalization).   3. Right ventricular systolic function is normal. The right ventricular  size is normal. Tricuspid regurgitation signal is inadequate for assessing  PA pressure.  _____________    2D Echocardiogram 10.20.2022   1. Left ventricular ejection fraction, by estimation, is 35 to 40%. The  left ventricle has moderately decreased function. The left ventricle  demonstrates global hypokinesis. Concern for anterior, anteroseptal and  apical hypokinesis. The left ventricular   internal cavity size was mildly dilated. Left ventricular diastolic  parameters are indeterminate.   2. Right ventricular systolic function is grossly moderately reduced  though difficult to visualize. The right ventricular size is mildly  enlarged.   3. Left atrial size was mildly dilated.   4. The mitral valve is normal in structure. No evidence of mitral valve  regurgitation. No evidence of mitral stenosis.   5. The aortic valve is normal in structure. Aortic valve regurgitation is  not visualized. No aortic stenosis is present.    6. The inferior vena cava is dilated in size with <50% respiratory  variability, suggesting right atrial pressure of 15 mmHg.   7. Bradycardia noted   8. Challenging images   Patient Profile     [name] is a 86 y.o. male with a history of VF arrest, CAD status post CABG x5 in October 2021, ischemic cardiomyopathy, heart failure with improved ejection fraction, hypertension, hyperlipidemia, diabetes, obesity, and stage III chronic kidney disease, who presetned 10/19 w/  progressive dyspnea, orthopnea, and volume overload.  Assessment & Plan    1.  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  Progressive dyspnea since October 16 prompting presentation October 19 with finding of volume overload.  Repeat echo October 20 and shows worsening of LV function, now down to 35 to 40% with global hypokinesis and concern for anterior, anteroseptal, and apical hypokinesis.  He has diuresed well though I's and O's are inaccurate.  His weight is recorded at 104.4 kg, which is still 4 kg above his previous dry weight in August.  Breathing not quite back to baseline though volume status significantly improved on exam.  He does have expiratory wheezing and received nebulizers.  His creatinine is 1.77 this morning, up from 1.64 yesterday.  This is still within his baseline range but as we are planning for diagnostic catheterization, hopefully on Monday, in the setting of worsening LV dysfunction, we will hold Lasix today.  No beta-blocker in the setting of baseline bradycardia with Mobitz 1 heart block and intermittent 2-1 heart block.  No ACE/ARB/Entresto/MRA in the setting of stage III chronic kidney disease with history of hyperkalemia on losartan and spironolactone.  Continue hydralazine and isosorbide, the latter of which was added yesterday.  Pressures  improved this morning.  2.  Bradycardia/Mobitz 1 heart block/intermittent 2-1 heart block with junctional escape: Known history of Mobitz 1 and right bundle branch block along with bradycardia.  Throughout this admission, has been noted to have bradycardia with rate sometimes dropping into the 30s, especially during hours of sleep, associated with intermittent 2-1 heart block, brief pauses, and junctional escape beats.  Unclear role of bradycardia in heart failure and worsening LV function.  He denies any history of presyncope or syncope.  Continue to avoid AV nodal blocking agents.  We will ask EP to eval if he still here on Tuesday.  3.  Coronary artery disease: Status post VF arrest October 2020 with subsequent CABG x5.  No chest pain.  High-sensitivity troponins are mildly elevated with a flat trend: 22-34-58-59.  Suspect demand ischemia in the setting of #1, the worsening LV function is concerning for worsening disease/graft closure.  Continue aspirin, Plavix, and statin therapy.  As above, we will pursue diagnostic catheterization next week provided that renal function is stable.  4.  Essential hypertension: Blood pressures improved following addition of isosorbide.  Continue hydralazine and nitrate therapy.  5.  Hyperlipidemia: LDL 53.  Continue statin therapy.  6.  Stage III chronic kidney disease: Creatinine (1.77 this morning) continues to climb with relatively stable BUN.  Holding Lasix given plan for diagnostic catheterization, potentially on Monday.  7.  History of hyperkalemia/hypokalemia: Had hyperkalemia in August in the setting of losartan spironolactone therapy, which has since resolved.  He received potassium supplementation yesterday in the setting of IV diuresis.  Potassium 3.5 this morning.  8.  Type 2 diabetes mellitus: A1c 7.8 in August.  Insulin therapy per medicine.  9.  COPD: Exam notable for expiratory wheezing.  Likely contributing to dyspnea at this point.   Inhaler/nebulizer therapy per medicine.  10.  Coughing/?  Aspiration: Patient coughing quite a bit during breakfast this morning.  Says that this is happened at home before.  Speech consult pending.  Signed, Murray Hodgkins, NP  08/29/2021, 9:32 AM    For questions or updates, please contact   Please consult  [email or web address] for contact info under Cardiology/STEMI.

## 2021-08-29 NOTE — Evaluation (Signed)
Clinical/Bedside Swallow Evaluation Patient Details  Name: [name] MRN: [id number] Date of Birth: 11-23-1934  Today's Date: 08/29/2021 Time: SLP Start Time (ACUTE ONLY): 0962 SLP Stop Time (ACUTE ONLY): 0945 SLP Time Calculation (min) (ACUTE ONLY): 40 min  Past Medical History:  Past Medical History:  Diagnosis Date   CAD (coronary artery disease)    a. 08/2019 VF arrest-->sev 3vd on cath-->CABGx5 (LIMA->LAD, VG->OM->LCX, VG->RPDA, VG->Diag).   CKD (chronic kidney disease), stage III ([facility])    Diabetes mellitus without complication ([place])    HFimpEF (heart failure with improved ejection fraction) ([place])    a. 08/2020 Echo: EF 40-45%; b. 11/2020 Echo: EF 55%, no rwma, mild LVH, Gr2 DD. nl RV fxn.   Hyperlipidemia LDL goal <70    Hypertension    Ischemic cardiomyopathy    a. 08/2020 Echo: EF 40-45%; b. 11/2020 Echo: EF 55%.   Past Surgical History:  Past Surgical History:  Procedure Laterality Date   BACK SURGERY     CARDIAC CATHETERIZATION     CLIPPING OF ATRIAL APPENDAGE N/A 08/29/2020   Procedure: CLIPPING OF ATRIAL APPENDAGE USING ATRICURE 38 MM ATRICLIP FLEX-V;  Surgeon: Grace Isaac, MD;  Location: [facility];  Service: Open Heart Surgery;  Laterality: N/A;   CORONARY ARTERY BYPASS GRAFT N/A 08/29/2020   Procedure: CORONARY ARTERY BYPASS GRAFTING (CABG) X 5 USING LEFT INTERNAL MAMMARY ARTERY AND ENDOSCOPICALLY HARVESTED RIGHT GREATER SAPHENOUS VEIN. LIMA TO LAD, SVG TO OM SEQ TO CIRC, SVG TO PD, SVG TO DIAG.;  Surgeon: Grace Isaac, MD;  Location: [facility];  Service: Open Heart Surgery;  Laterality: N/A;   ENDOVEIN HARVEST OF GREATER SAPHENOUS VEIN Right 08/29/2020   Procedure: ENDOVEIN HARVEST OF GREATER SAPHENOUS VEIN;  Surgeon: Grace Isaac, MD;  Location: [facility];  Service: Open Heart Surgery;  Laterality: Right;   HERNIA REPAIR     LEFT HEART CATH AND CORONARY ANGIOGRAPHY N/A 08/25/2020   Procedure: LEFT HEART CATH AND CORONARY ANGIOGRAPHY;  Surgeon: [name], MD;  Location: [facility];  Service: Cardiovascular;  Laterality: N/A;   TEE WITHOUT CARDIOVERSION N/A 08/29/2020   Procedure: TRANSESOPHAGEAL ECHOCARDIOGRAM (TEE);  Surgeon: Grace Isaac, MD;  Location: [facility];  Service: Open Heart Surgery;  Laterality: N/A;   HPI:  Pt is a 86 y.o. male with medical history significant of  Obesity, dCHF, HTN, HLD, DM, GERD, CKD stage IIIa, CAD, CABG, cardiac arrest, who presents with shortness of breath.   Patient states that he has shortness breath for several days, which has been progressively worsening, particularly on exertion.  It is worse when he is laying flat.  He also has worsening leg edema bilaterally.  He has cough with clear mucus production.  No chest pain, fever or chills.  Denies nausea vomiting, diarrhea or abdominal pain.  No symptoms of UTI. He was seen by his primary cardiologist recently and did have his Lasix dose increased, but his symptoms has not improved significantly.   CXR: bibasilar/bilateral airspace infiltrates with possible small effusions.  At baseline, pt is independent for most of ADL per chart.    Assessment / Plan / Recommendation  Clinical Impression  Pt appears to present w/ adequate oropharyngeal phase swallow function w/ No oropharyngeal phase dysphagia noted during the evaluation, No neuromuscular deficits noted. Pt has no Neurological history per chart. He does have GERD Baseline and is being followed by Cardiology; lasix has been given this admit. Pt consumed po trials of breakfast meal feeding self  w/ No overt, clinical s/s of aspiration during po trials. Pt swallowed multiple pills at one time w/ sips of water via Straw w/ NSG present. Pt appears at reduced risk for aspiration following general aspiration precautions.  OF NOTE: NSG and pt reported pt had the coughing episode while eating some of his breakfast earlier this morning w/ multiple people in the room; Talking ongoing. Education given on NOT  Talking and Reduce Distractions during meals.    During po trials, pt consumed all consistencies w/ no overt coughing, decline in vocal quality, or change in respiratory presentation during/post trials. Oral phase appeared [facility] w/ timely bolus management, mastication, and control of bolus propulsion for A-P transfer for swallowing. Oral clearing achieved w/ all trial consistencies. OM Exam appeared [facility] w/ no unilateral weakness noted. Speech Clear.    Recommend continue a Regular consistency diet w/ well-Cut meats, moistened foods; Thin liquids. Recommend general aspiration and REFLUX precautions, Pills WHOLE in Puree if needed for safer, easier swallowing. Education given on Pills in Puree; food consistencies and easy to eat options; general aspiration precautions. Education given on NOT Talking and Reduce Distractions during meals.GERD precautions; handouts left on REFLUX precautions. NSG to reconsult if any new needs arise. NSG updated, agreed. SLP Visit Diagnosis: Dysphagia, unspecified (R13.10) (baseline GERD)    Aspiration Risk   (reduced following general precautions)    Diet Recommendation  Regular consistency diet w/ well-Cut meats, moistened foods; Thin liquids. Recommend general aspiration and REFLUX precautions. Rest Breaks during meal and REDUCE Distractions including TALKING during meals.   Medication Administration: Whole meds with liquid (applesauce if easier)    Other  Recommendations Recommended Consults: Consider GI evaluation (for management of GERD) Oral Care Recommendations: Oral care BID;Oral care before and after PO;Patient independent with oral care Other Recommendations:  (n/a)    Recommendations for follow up therapy are one component of a multi-disciplinary discharge planning process, led by the attending physician.  Recommendations may be updated based on patient status, additional functional criteria and insurance authorization.  Follow up Recommendations None       Frequency and Duration  (n/a)   (n/a)       Prognosis Prognosis for Safe Diet Advancement: Good Barriers to Reach Goals:  (GERD)      Swallow Study   General Date of Onset: 08/26/21 HPI: Pt is a 86 y.o. male with medical history significant of  Obesity, dCHF, HTN, HLD, DM, GERD, CKD stage IIIa, CAD, CABG, cardiac arrest, who presents with shortness of breath.   Patient states that he has shortness breath for several days, which has been progressively worsening, particularly on exertion.  It is worse when he is laying flat.  He also has worsening leg edema bilaterally.  He has cough with clear mucus production.  No chest pain, fever or chills.  Denies nausea vomiting, diarrhea or abdominal pain.  No symptoms of UTI. He was seen by his primary cardiologist recently and did have his Lasix dose increased, but his symptoms has not improved significantly.   CXR: bibasilar/bilateral airspace infiltrates with possible small effusions.  At baseline, pt is independent for most of ADL per chart. Type of Study: Bedside Swallow Evaluation Previous Swallow Assessment: none Diet Prior to this Study: Regular;Thin liquids Temperature Spikes Noted: No (wbc 5.0) Respiratory Status: Nasal cannula (2L) History of Recent Intubation: No Behavior/Cognition: Alert;Cooperative;Pleasant mood Oral Cavity Assessment: Within Functional Limits Oral Care Completed by SLP: Recent completion by staff Oral Cavity - Dentition: Adequate natural dentition Vision:  Functional for self-feeding Self-Feeding Abilities: Able to feed self Patient Positioning: Upright in bed Baseline Vocal Quality: Normal Volitional Cough: Strong (upper airway congestion sounding) Volitional Swallow: Able to elicit    Oral/Motor/Sensory Function Overall Oral Motor/Sensory Function: Within functional limits   Ice Chips Ice chips: Not tested   Thin Liquid Thin Liquid: Within functional limits Presentation: Self Fed;Straw (8-10 sips w/ sips  during Pills swallowing)    Nectar Thick Nectar Thick Liquid: Not tested   Honey Thick Honey Thick Liquid: Not tested   Puree Puree: Within functional limits Presentation: Spoon;Self Fed (4 ozs)   Solid     Solid: Within functional limits Presentation: Self Fed;Spoon (moistened well)        Orinda Kenner, MS, CCC-SLP Speech Language Pathologist Rehab Services [phone] Nyah Shepherd 08/29/2021,9:53 AM

## 2021-08-29 NOTE — Progress Notes (Addendum)
PROGRESS NOTE    [name]  IRC:[id number] DOB: Jun 26, 1935 DOA: 08/26/2021 PCP: Tomasita Morrow, MD   Assessment & Plan:   Principal Problem:   Acute on chronic diastolic CHF (congestive heart failure) ([place]) Active Problems:   HTN (hypertension)   Stage 3a chronic kidney disease (HCC)   CAD (coronary artery disease)   HLD (hyperlipidemia)   GERD (gastroesophageal reflux disease)   Type II diabetes mellitus with renal manifestations (HCC)    Acute on chronic diastolic CHF: repeat echo shows EF 35-40%, LV global hypokinesis, diastolic function was indeterminate.  Has leg edema, positive JVD, elevated BNP 1040, clinically consistent with CHF exacerbation. Hold IV lasix secondary to elevated Cr. Monitor I/Os and daily weights. May need right and left heart cath once kidney function has stabilized & volume status improved as per cardio   Bradycardia: w/ [place] I block as per cardio. Asymptomatic but drops into 30s. Continue on to hold CCB. EP will likely see pt on 09/01/21. Continue on tele. Cardio recs apprec   HTN: continue on home dose of imdur, hydralazine as per cardio. IV hydralazine prn   AKI on CKDIIIa: baseline Cr 1.62. Cr is trending up again today. Avoid nephrotoxic meds    CAD: w/ elevated troponin. S/p of CABG. Likely secondary to demand ischemia. Continue on statin, aspirin  HLD: continue on statin    GERD: continue on PPI    DM2: HbA1c 7.8, poorly controlled. Continue on levemir, SSI w/ accuchecks   Thrombocytopenia: resolved    Morbid obesity: BMI 35.2. Complicates overall care & prognosis    DVT prophylaxis: lovenox  Code Status: full  Family Communication:  Disposition Plan: likely d/c back home   Level of care: Progressive Cardiac  Status is: Inpatient  Remains inpatient appropriate because: severity of illness    Consultants:  Cardio   Procedures:  Antimicrobials:    Subjective: Pt c/o cough and "tickle in my  throat."  Objective: Vitals:   08/29/21 0024 08/29/21 0421 08/29/21 0522 08/29/21 0729  BP: (!) 147/73 105/62  (!) 136/51  Pulse: (!) 45 (!) 57  71  Resp: 18 18  18   Temp: 98.4 F (36.9 C) 98.4 F (36.9 C)  98.1 F (36.7 C)  TempSrc:      SpO2: 98% 100%  96%  Weight:   104.4 kg   Height:        Intake/Output Summary (Last 24 hours) at 08/29/2021 0733 Last data filed at 08/29/2021 0728 Gross per 24 hour  Intake --  Output 1350 ml  Net -1350 ml   Filed Weights   08/26/21 0754 08/29/21 0522  Weight: 105.2 kg 104.4 kg    Examination:  General exam: Appears uncomfortable  Respiratory system: course breath sounds b/l. Intermittent wheezes Cardiovascular system: S1 & S2+. No rubs or clicks  Gastrointestinal system: Abd is soft, NT, obese & hypoactive bowel sounds  Central nervous system: alert and oriented. Moves all extremities  Psychiatry: judgement and insight appear normal. Flat mood and affect   Data Reviewed: I have personally reviewed following labs and imaging studies  CBC: Recent Labs  Lab 08/26/21 0750 08/28/21 0600 08/29/21 0603  WBC 6.2 6.2 5.0  HGB 15.6 11.8* 11.4*  HCT 48.6 37.0* 35.2*  MCV 92.2 90.7 91.0  PLT 138* 198 510   Basic Metabolic Panel: Recent Labs  Lab 08/26/21 0750 08/27/21 0628 08/28/21 0600 08/29/21 0603  NA 140 140 141 140  K 3.6 3.6 3.4* 3.5  CL 108 108 109  104  CO2 22 25 27 27   GLUCOSE 135* 118* 70 111*  BUN 27* 26* 25* 27*  CREATININE 1.32* 1.51* 1.64* 1.77*  CALCIUM 8.7* 8.1* 8.1* 8.1*  MG  --  1.8 1.9 1.9   GFR: Estimated Creatinine Clearance: 36.4 mL/min (A) (by C-G formula based on SCr of 1.77 mg/dL (H)). Liver Function Tests: Recent Labs  Lab 08/26/21 0750  AST 56*  ALT 93*  ALKPHOS 84  BILITOT 0.6  PROT 6.7  ALBUMIN 3.8   No results for input(s): LIPASE, AMYLASE in the last 168 hours. No results for input(s): AMMONIA in the last 168 hours. Coagulation Profile: No results for input(s): INR, PROTIME  in the last 168 hours. Cardiac Enzymes: No results for input(s): CKTOTAL, CKMB, CKMBINDEX, TROPONINI in the last 168 hours. BNP (last 3 results) No results for input(s): PROBNP in the last 8760 hours. HbA1C: Recent Labs    08/26/21 0959  HGBA1C 7.1*   CBG: Recent Labs  Lab 08/27/21 2241 08/28/21 0752 08/28/21 1131 08/28/21 1607 08/28/21 2009  GLUCAP 127* 86 132* 110* 135*   Lipid Profile: Recent Labs    08/27/21 0628  CHOL 114  HDL 45  LDLCALC 53  TRIG 78  CHOLHDL 2.5   Thyroid Function Tests: No results for input(s): TSH, T4TOTAL, FREET4, T3FREE, THYROIDAB in the last 72 hours. Anemia Panel: No results for input(s): VITAMINB12, FOLATE, FERRITIN, TIBC, IRON, RETICCTPCT in the last 72 hours. Sepsis Labs: Recent Labs  Lab 08/26/21 0750  PROCALCITON <0.10    Recent Results (from the past 240 hour(s))  Resp Panel by RT-PCR (Flu A&B, Covid) Nasopharyngeal Swab     Status: None   Collection Time: 08/26/21  8:21 AM   Specimen: Nasopharyngeal Swab; Nasopharyngeal(NP) swabs in vial transport medium  Result Value Ref Range Status   SARS Coronavirus 2 by RT PCR NEGATIVE NEGATIVE Final    Comment: (NOTE) SARS-CoV-2 target nucleic acids are NOT DETECTED.  The SARS-CoV-2 RNA is generally detectable in upper respiratory specimens during the acute phase of infection. The lowest concentration of SARS-CoV-2 viral copies this assay can detect is 138 copies/mL. A negative result does not preclude SARS-Cov-2 infection and should not be used as the sole basis for treatment or other patient management decisions. A negative result may occur with  improper specimen collection/handling, submission of specimen other than nasopharyngeal swab, presence of viral mutation(s) within the areas targeted by this assay, and inadequate number of viral copies(<138 copies/mL). A negative result must be combined with clinical observations, patient history, and epidemiological information. The  expected result is Negative.  Fact Sheet for Patients:  EntrepreneurPulse.com.au  Fact Sheet for Healthcare Providers:  IncredibleEmployment.be  This test is no t yet approved or cleared by the [place] FDA and  has been authorized for detection and/or diagnosis of SARS-CoV-2 by FDA under an Emergency Use Authorization (EUA). This EUA will remain  in effect (meaning this test can be used) for the duration of the COVID-19 declaration under Section 564(b)(1) of the Act, 21 U.S.C.section 360bbb-3(b)(1), unless the authorization is terminated  or revoked sooner.       Influenza A by PCR NEGATIVE NEGATIVE Final   Influenza B by PCR NEGATIVE NEGATIVE Final    Comment: (NOTE) The Xpert Xpress SARS-CoV-2/FLU/RSV plus assay is intended as an aid in the diagnosis of influenza from Nasopharyngeal swab specimens and should not be used as a sole basis for treatment. Nasal washings and aspirates are unacceptable for Xpert Xpress SARS-CoV-2/FLU/RSV testing.  Fact Sheet for Patients: EntrepreneurPulse.com.au  Fact Sheet for Healthcare Providers: IncredibleEmployment.be  This test is not yet approved or cleared by the [place] FDA and has been authorized for detection and/or diagnosis of SARS-CoV-2 by FDA under an Emergency Use Authorization (EUA). This EUA will remain in effect (meaning this test can be used) for the duration of the COVID-19 declaration under Section 564(b)(1) of the Act, 21 U.S.C. section 360bbb-3(b)(1), unless the authorization is terminated or revoked.  Performed at [facility], [address], [place], [place] [address]          Radiology Studies: ECHOCARDIOGRAM COMPLETE  Result Date: 08/27/2021    ECHOCARDIOGRAM REPORT   Patient Name:   [name] Date of Exam: 08/27/2021 Medical Rec #:  [id number]    Height:       68.0 in Accession #:    [id number]   Weight:        232.0 lb Date of Birth:  12-Apr-1935   BSA:          2.177 m Patient Age:    86 years     BP:           124/85 mmHg Patient Gender: M            HR:           49 bpm. Exam Location:  [facility] Procedure: 2D Echo, Color Doppler, Cardiac Doppler and Intracardiac            Opacification Agent Indications:     I50.31 congestive heart failure-Acute Diastolic  History:         Patient has prior history of Echocardiogram examinations, most                  recent 11/27/2020. CAD, Prior CABG, CKD,                  Signs/Symptoms:Shortness of Breath; Risk Factors:Hypertension,                  Diabetes and Dyslipidemia.  Sonographer:     Charmayne Sheer Referring Phys:  [place] Diagnosing Phys: Ida Rogue MD  Sonographer Comments: Technically difficult study due to poor echo windows. IMPRESSIONS  1. Left ventricular ejection fraction, by estimation, is 35 to 40%. The left ventricle has moderately decreased function. The left ventricle demonstrates global hypokinesis. Concern for anterior, anteroseptal and apical hypokinesis. The left ventricular  internal cavity size was mildly dilated. Left ventricular diastolic parameters are indeterminate.  2. Right ventricular systolic function is grossly moderately reduced though difficult to visualize. The right ventricular size is mildly enlarged.  3. Left atrial size was mildly dilated.  4. The mitral valve is normal in structure. No evidence of mitral valve regurgitation. No evidence of mitral stenosis.  5. The aortic valve is normal in structure. Aortic valve regurgitation is not visualized. No aortic stenosis is present.  6. The inferior vena cava is dilated in size with <50% respiratory variability, suggesting right atrial pressure of 15 mmHg.  7. Bradycardia noted  8. Challenging images FINDINGS  Left Ventricle: Left ventricular ejection fraction, by estimation, is 35 to 40%. The left ventricle has moderately decreased function. The left ventricle demonstrates global  hypokinesis. The left ventricular internal cavity size was mildly dilated. There is no left ventricular hypertrophy. Left ventricular diastolic parameters are indeterminate. Right Ventricle: The right ventricular size is mildly enlarged. No increase in right ventricular wall thickness. Right ventricular systolic function is moderately reduced.  Left Atrium: Left atrial size was mildly dilated. Right Atrium: Right atrial size was normal in size. Pericardium: There is no evidence of pericardial effusion. Mitral Valve: The mitral valve is normal in structure. No evidence of mitral valve regurgitation. No evidence of mitral valve stenosis. MV peak gradient, 3.0 mmHg. The mean mitral valve gradient is 1.0 mmHg. Tricuspid Valve: The tricuspid valve is normal in structure. Tricuspid valve regurgitation is mild . No evidence of tricuspid stenosis. Aortic Valve: The aortic valve is normal in structure. Aortic valve regurgitation is not visualized. No aortic stenosis is present. Aortic valve mean gradient measures 4.0 mmHg. Aortic valve peak gradient measures 7.3 mmHg. Aortic valve area, by VTI measures 2.38 cm. Pulmonic Valve: The pulmonic valve was normal in structure. Pulmonic valve regurgitation is not visualized. No evidence of pulmonic stenosis. Aorta: The aortic root is normal in size and structure. Venous: The inferior vena cava is dilated in size with less than 50% respiratory variability, suggesting right atrial pressure of 15 mmHg. IAS/Shunts: No atrial level shunt detected by color flow Doppler.  LEFT VENTRICLE PLAX 2D LVIDd:         5.48 cm      Diastology LVIDs:         4.27 cm      LV e' medial:    7.62 cm/s LV PW:         1.16 cm      LV E/e' medial:  11.0 LV IVS:        1.02 cm      LV e' lateral:   6.64 cm/s LVOT diam:     2.10 cm      LV E/e' lateral: 12.7 LV SV:         73 LV SV Index:   34 LVOT Area:     3.46 cm  LV Volumes (MOD) LV vol d, MOD A2C: 131.0 ml LV vol d, MOD A4C: 123.0 ml LV vol s, MOD A2C:  69.8 ml LV vol s, MOD A4C: 58.0 ml LV SV MOD A2C:     61.2 ml LV SV MOD A4C:     123.0 ml LV SV MOD BP:      63.4 ml RIGHT VENTRICLE RV Basal diam:  4.62 cm LEFT ATRIUM             Index        RIGHT ATRIUM           Index LA diam:        4.30 cm 1.98 cm/m   RA Area:     17.00 cm LA Vol (A2C):   62.4 ml 28.67 ml/m  RA Volume:   50.60 ml  23.24 ml/m LA Vol (A4C):   32.6 ml 14.98 ml/m LA Biplane Vol: 50.0 ml 22.97 ml/m  AORTIC VALVE                    PULMONIC VALVE AV Area (Vmax):    2.48 cm     PV Vmax:       0.96 m/s AV Area (Vmean):   2.20 cm     PV Vmean:      61.400 cm/s AV Area (VTI):     2.38 cm     PV VTI:        0.203 m AV Vmax:           135.00 cm/s  PV Peak grad:  3.7 mmHg AV Vmean:  89.800 cm/s  PV Mean grad:  2.0 mmHg AV VTI:            0.307 m AV Peak Grad:      7.3 mmHg AV Mean Grad:      4.0 mmHg LVOT Vmax:         96.60 cm/s LVOT Vmean:        57.000 cm/s LVOT VTI:          0.211 m LVOT/AV VTI ratio: 0.69  AORTA Ao Root diam: 3.40 cm MITRAL VALVE               TRICUSPID VALVE MV Area (PHT): 2.16 cm    TR Peak grad:   20.2 mmHg MV Area VTI:   2.30 cm    TR Vmax:        225.00 cm/s MV Peak grad:  3.0 mmHg MV Mean grad:  1.0 mmHg    SHUNTS MV Vmax:       0.86 m/s    Systemic VTI:  0.21 m MV Vmean:      55.3 cm/s   Systemic Diam: 2.10 cm MV Decel Time: 352 msec MV E velocity: 84.20 cm/s MV A velocity: 29.80 cm/s MV E/A ratio:  2.83 Ida Rogue MD Electronically signed by Ida Rogue MD Signature Date/Time: 08/27/2021/9:13:15 PM    Final         Scheduled Meds:  acyclovir  800 mg Oral Daily   aspirin EC  81 mg Oral Daily   budesonide (PULMICORT) nebulizer solution  0.25 mg Nebulization BID   clopidogrel  75 mg Oral Daily   enoxaparin (LOVENOX) injection  40 mg Subcutaneous Q24H   furosemide  60 mg Intravenous BID   hydrALAZINE  25 mg Oral TID   influenza vaccine adjuvanted  0.5 mL Intramuscular Tomorrow-1000   insulin aspart  0-5 Units Subcutaneous QHS   insulin  aspart  0-9 Units Subcutaneous TID WC   insulin detemir  15 Units Subcutaneous BID   isosorbide mononitrate  30 mg Oral Daily   magnesium oxide  400 mg Oral Daily   multivitamin with minerals  1 tablet Oral Daily   oxybutynin  5 mg Oral BID   pantoprazole  40 mg Oral BID   rosuvastatin  40 mg Oral Daily   Continuous Infusions:   LOS: 3 days    Time spent: 31 mins     Wyvonnia Dusky, MD [facility] Pager [phone]  If 7PM-7AM, please contact night-coverage 08/29/2021, 7:33 AM

## 2021-08-29 NOTE — TOC Transition Note (Signed)
Transition of Care [facility]) - CM/SW Discharge Note   Patient Details  Name: Roylee Chaffin MRN: [id number] Date of Birth: Aug 14, 1935  [facility] CM/SW Contact:  Harriet Masson, RN Phone Number: 08/29/2021, 3:56 PM   Clinical Narrative:    Recommended for PT/OT/RN. Spoke with the wife Enid Derry) who was open to any agency for choice. Spoke with Mal[facility] [employer]) who accepted pt for [facility] services. Spoke wife Enid Derry) who is aware.   Verified pt has sufficient transportation to all medical appointments, able to afford all his medications with a good support system in the single family home with his wife. DME verified with a RW used when needed. No other needs or request at this time.  TOC team updated with no other request at this time.   Final next level of care: [place] Barriers to Discharge: Barriers Resolved   Patient Goals and CMS Choice Patient states their goals for this hospitalization and ongoing recovery are:: To get back to work as soon as possible [employer] Compare Post Acute Care list provided to:: Other (Comment Required) Choice offered to / list presented to : Spouse  Discharge Placement                  Name of family member notified: Enid Derry (wife) Patient and family notified of of transfer: 08/29/21  Discharge Plan and Services In-house Referral: Clinical Social Work   Post Acute Care Choice: Home Health, Durable Medical Equipment                    HH Arranged: RN, PT, OT [facility] Agency: [place] Date [place]: 08/29/21 Time HH Agency Contacted: 102 Representative spoke with at [place]: [place] ([place]) Interventions     Readmission Risk Interventions Readmission Risk Prevention Plan 09/10/2020 08/28/2020  Transportation Screening - Complete  HRI or [place] - Complete  Social Work Consult for [place] Planning/Counseling -  Complete  Palliative Care Screening - Not Applicable  Medication Review Press [employer]) - Complete  PCP or Specialist appointment within 3-5 days of discharge Complete -  [place] or [place] Complete -  SW Recovery Care/Counseling Consult Complete -  Palliative Care Screening Not Applicable -  [place] Not Applicable -  Some recent data might be hidden

## 2021-08-30 DIAGNOSIS — I25118 Atherosclerotic heart disease of native coronary artery with other forms of angina pectoris: Secondary | ICD-10-CM | POA: Diagnosis not present

## 2021-08-30 DIAGNOSIS — I10 Essential (primary) hypertension: Secondary | ICD-10-CM | POA: Diagnosis not present

## 2021-08-30 DIAGNOSIS — I5033 Acute on chronic diastolic (congestive) heart failure: Secondary | ICD-10-CM | POA: Diagnosis not present

## 2021-08-30 DIAGNOSIS — N1831 Chronic kidney disease, stage 3a: Secondary | ICD-10-CM | POA: Diagnosis not present

## 2021-08-30 LAB — CBC
HCT: 34.5 % — ABNORMAL LOW (ref 39.0–52.0)
Hemoglobin: 11.3 g/dL — ABNORMAL LOW (ref 13.0–17.0)
MCH: 29.8 pg (ref 26.0–34.0)
MCHC: 32.8 g/dL (ref 30.0–36.0)
MCV: 91 fL (ref 80.0–100.0)
Platelets: 168 10*3/uL (ref 150–400)
RBC: 3.79 MIL/uL — ABNORMAL LOW (ref 4.22–5.81)
RDW: 14 % (ref 11.5–15.5)
WBC: 5.1 10*3/uL (ref 4.0–10.5)
nRBC: 0 % (ref 0.0–0.2)

## 2021-08-30 LAB — BASIC METABOLIC PANEL
Anion gap: 6 (ref 5–15)
BUN: 31 mg/dL — ABNORMAL HIGH (ref 8–23)
CO2: 26 mmol/L (ref 22–32)
Calcium: 7.9 mg/dL — ABNORMAL LOW (ref 8.9–10.3)
Chloride: 109 mmol/L (ref 98–111)
Creatinine, Ser: 1.69 mg/dL — ABNORMAL HIGH (ref 0.61–1.24)
GFR, Estimated: 40 mL/min — ABNORMAL LOW (ref >60)
Glucose, Bld: 147 mg/dL — ABNORMAL HIGH (ref 70–99)
Potassium: 3.4 mmol/L — ABNORMAL LOW (ref 3.5–5.1)
Sodium: 141 mmol/L (ref 135–145)

## 2021-08-30 LAB — GLUCOSE, CAPILLARY
Glucose-Capillary: 115 mg/dL — ABNORMAL HIGH (ref 70–99)
Glucose-Capillary: 176 mg/dL — ABNORMAL HIGH (ref 70–99)
Glucose-Capillary: 118 mg/dL — ABNORMAL HIGH (ref 70–99)
Glucose-Capillary: 199 mg/dL — ABNORMAL HIGH (ref 70–99)

## 2021-08-30 LAB — MAGNESIUM: Magnesium: 2 mg/dL (ref 1.7–2.4)

## 2021-08-30 MED ORDER — SODIUM CHLORIDE 0.9 % IV SOLN: 250.0000 mL | INTRAVENOUS | Status: DC | PRN

## 2021-08-30 MED ORDER — ASPIRIN 81 MG PO CHEW
81.0000 mg | CHEWABLE_TABLET | ORAL | Status: AC
Start: 2021-08-31 — End: 2021-08-31
  Administered 2021-08-31: 81 mg via ORAL

## 2021-08-30 MED ORDER — SODIUM CHLORIDE 0.9% FLUSH: 3.0000 mL | INTRAVENOUS | Status: DC | PRN

## 2021-08-30 MED ORDER — SODIUM CHLORIDE 0.9 % IV SOLN
INTRAVENOUS | Status: DC
Start: 2021-08-31 — End: 2021-08-31

## 2021-08-30 NOTE — Progress Notes (Signed)
Progress Note  Patient Name: [name] Date of Encounter: 08/30/2021  [facility] Cardiologist: [name], MD   Subjective   Reports feeling well, denies shortness of breath Has been walking little bit around the room, no family at the bedside    Inpatient Medications    Scheduled Meds:  acyclovir  800 mg Oral Daily   aspirin EC  81 mg Oral Daily   budesonide (PULMICORT) nebulizer solution  0.25 mg Nebulization BID   clopidogrel  75 mg Oral Daily   enoxaparin (LOVENOX) injection  40 mg Subcutaneous Q24H   hydrALAZINE  25 mg Oral TID   insulin aspart  0-5 Units Subcutaneous QHS   insulin aspart  0-9 Units Subcutaneous TID WC   insulin detemir  15 Units Subcutaneous BID   isosorbide mononitrate  30 mg Oral Daily   magnesium oxide  400 mg Oral Daily   multivitamin with minerals  1 tablet Oral Daily   oxybutynin  5 mg Oral BID   pantoprazole  40 mg Oral BID   rosuvastatin  40 mg Oral Daily   sodium chloride flush  3 mL Intravenous Q12H   Continuous Infusions:  PRN Meds: acetaminophen, albuterol, benzonatate, dextromethorphan-guaiFENesin, hydrALAZINE, ondansetron (ZOFRAN) IV, phenol   Vital Signs    Vitals:   08/30/21 0621 08/30/21 0700 08/30/21 0731 08/30/21 1135  BP:   (!) 152/76 (!) 144/74  Pulse:   (!) 57 (!) 54  Resp:   18 18  Temp:   97.9 F (36.6 C) 97.8 F (36.6 C)  TempSrc:      SpO2:  97% 98% 99%  Weight: 103.4 kg     Height:       No intake or output data in the 24 hours ending 08/30/21 1443 Last 3 Weights 08/30/2021 08/29/2021 08/26/2021  Weight (lbs) 227 lb 14.4 oz 230 lb 3.2 oz 232 lb  Weight (kg) 103.375 kg 104.418 kg 105.235 kg      Telemetry    Normal sinus rhythm- Personally Reviewed  ECG      - Personally Reviewed  Physical Exam   GEN: No acute distress.   Neck: No JVD Cardiac: RRR, no murmurs, rubs, or gallops.  Respiratory: Clear to auscultation bilaterally. GI: Soft, nontender, non-distended  MS: No edema; No  deformity. Neuro:  Nonfocal  Psych: Normal affect   Labs    High Sensitivity Troponin:   Recent Labs  Lab 08/26/21 0750 08/26/21 0959 08/26/21 1415 08/26/21 1630  TROPONINIHS 22* 34* 58* 59*     Chemistry Recent Labs  Lab 08/26/21 0750 08/27/21 0628 08/28/21 0600 08/29/21 0603 08/30/21 0502  NA 140   < > 141 140 141  K 3.6   < > 3.4* 3.5 3.4*  CL 108   < > 109 104 109  CO2 22   < > 27 27 26   GLUCOSE 135*   < > 70 111* 147*  BUN 27*   < > 25* 27* 31*  CREATININE 1.32*   < > 1.64* 1.77* 1.69*  CALCIUM 8.7*   < > 8.1* 8.1* 7.9*  MG  --    < > 1.9 1.9 2.0  PROT 6.7  --   --   --   --   ALBUMIN 3.8  --   --   --   --   AST 56*  --   --   --   --   ALT 93*  --   --   --   --  ALKPHOS 84  --   --   --   --   BILITOT 0.6  --   --   --   --   GFRNONAA 53*   < > 41* 37* 40*  ANIONGAP 10   < > 5 9 6    < > = values in this interval not displayed.    Lipids  Recent Labs  Lab 08/27/21 0628  CHOL 114  TRIG 78  HDL 45  LDLCALC 53  CHOLHDL 2.5    Hematology Recent Labs  Lab 08/28/21 0600 08/29/21 0603 08/30/21 0502  WBC 6.2 5.0 5.1  RBC 4.08* 3.87* 3.79*  HGB 11.8* 11.4* 11.3*  HCT 37.0* 35.2* 34.5*  MCV 90.7 91.0 91.0  MCH 28.9 29.5 29.8  MCHC 31.9 32.4 32.8  RDW 14.0 14.0 14.0  PLT 198 171 168   Thyroid No results for input(s): TSH, FREET4 in the last 168 hours.  BNP Recent Labs  Lab 08/26/21 0750  BNP 1,040.2*    DDimer No results for input(s): DDIMER in the last 168 hours.   Radiology    No results found.  Cardiac Studies     Patient Profile     Bookert Guzzi is a 86 y.o. male with a history of VF arrest, CAD status post CABG x5 in October 2021, ischemic cardiomyopathy, heart failure with improved ejection fraction, hypertension, hyperlipidemia, diabetes, obesity, and stage III chronic kidney disease, who presetned 10/19 w/ progressive dyspnea, orthopnea, and volume overload.  Assessment & Plan    Acute on chronic diastolic and systolic  CHF ejection fraction January 2022 to 55% Echo this admission ejection fraction 35 to 40% presenting with acute on chronic diastolic and systolic CHF -Some concern from family concerning his medication compliance with Lasix 80 daily  Continue hydralazine, Imdur, further titration as blood pressure tolerates Beta-blocker on hold for bradycardia ACE ARB on hold secondary to renal dysfunction -IV Lasix on hold for pending catheterization tomorrow   Coronary artery disease with stable angina History of CABG x5 Elevated troponin, consider cardiac catheterization Monday for depressed ejection fraction 35 to 40%, concern for anterior wall hypokinesis Continue aspirin Plavix statin, not on beta-blocker secondary to bradycardia   Chronic kidney disease stage IIIb Typically range creatinine 1.3-1.7 Avoid NSAIDs Hold ACE ARB Entresto for now   Type 2 diabetes with complications W9N 7.8 Consider Farxiga/SGLT2 inhibitor, proven to help preserve kidney function protect from end-stage renal disease and cardiovascular death   Junctional bradycardia, secondary AV block type I Asymptomatic, heart rates in the 40s and 50s, avoid beta-blockers Ischemic work-up tomorrow Then consider Zio monitor and outpatient follow-up with EP   Total encounter time more than 25 minutes  Greater than 50% was spent in counseling and coordination of care with the patient    For questions or updates, please contact [facility] Please consult [email or web address] for contact info under        Signed, Ida Rogue, MD  08/30/2021, 2:43 PM

## 2021-08-30 NOTE — Progress Notes (Signed)
PROGRESS NOTE    [name]  QIW:[id number] DOB: [birth year] DOA: 08/26/2021 PCP: Tomasita Morrow, MD   Assessment & Plan:   Principal Problem:   Acute on chronic diastolic CHF (congestive heart failure) ([facility]) Active Problems:   HTN (hypertension)   Stage 3a chronic kidney disease (HCC)   CAD (coronary artery disease)   HLD (hyperlipidemia)   GERD (gastroesophageal reflux disease)   Type II diabetes mellitus with renal manifestations (HCC)    Acute on chronic combined CHF: repeat echo shows EF 35-40%, LV global hypokinesis, diastolic function was indeterminate.  Has leg edema, positive JVD, elevated BNP 1040, clinically consistent with CHF exacerbation. Continue to hold lasix secondary to AKI on CKD. Monitor I/Os and daily weights. Will go for cardiac cath tomorrow as per cardio   Bradycardia: w/ [place] I block as per cardio. Asymptomatic but drops into 30s. Continue on to hold CCB. EP will likely see pt on 09/01/21. Continue on tele. Cardio recs apprec   HTN: continue on hydralazine, imdur as per cardio. IV hydralazine prn  AKI on CKDIIIa: baseline Cr 1.62. Cr is labile. Avoid nephrotoxic meds    CAD: w/ elevated troponin. S/p of CABG. Will go for cardiac cath tomorrow   HLD: continue on statin   GERD: continue on PPI    DM2: poorly controlled, HbA1c 7.8. Continue on levemir, SSI w/ accuchecks   Thrombocytopenia: resolved    Morbid obesity: BMI 35.2. Complicates overall care & prognosis     DVT prophylaxis: lovenox  Code Status: full  Family Communication:  Disposition Plan: likely d/c back home   Level of care: Progressive Cardiac  Status is: Inpatient  Remains inpatient appropriate because: severity of illness    Consultants:  Cardio   Procedures:  Antimicrobials:    Subjective: Pt c/o fatigue   Objective: Vitals:   08/30/21 0011 08/30/21 0313 08/30/21 0621 08/30/21 0731  BP: 129/68 (!) 151/67  (!) 152/76  Pulse: (!) 54 85  (!) 57   Resp: 19 18  18   Temp: 97.9 F (36.6 C) 97.8 F (36.6 C)  97.9 F (36.6 C)  TempSrc:      SpO2: 98% 98%  98%  Weight:   103.4 kg   Height:        Intake/Output Summary (Last 24 hours) at 08/30/2021 0811 Last data filed at 08/29/2021 1206 Gross per 24 hour  Intake 240 ml  Output 0 ml  Net 240 ml   Filed Weights   08/26/21 0754 08/29/21 0522 08/30/21 0621  Weight: 105.2 kg 104.4 kg 103.4 kg    Examination:  General exam: Appears calm & comfortable  Respiratory system: diminished breath sounds b/l  Cardiovascular system: S1/S2+. No rubs or gallops  Gastrointestinal system: Abd is soft, NT, obese & normal bowel sounds  Central nervous system: Alert and oriented. Moves all extremities  Psychiatry: Judgement and insight appears normal. Flat mood and affect    Data Reviewed: I have personally reviewed following labs and imaging studies  CBC: Recent Labs  Lab 08/26/21 0750 08/28/21 0600 08/29/21 0603 08/30/21 0502  WBC 6.2 6.2 5.0 5.1  HGB 15.6 11.8* 11.4* 11.3*  HCT 48.6 37.0* 35.2* 34.5*  MCV 92.2 90.7 91.0 91.0  PLT 138* 198 171 417   Basic Metabolic Panel: Recent Labs  Lab 08/26/21 0750 08/27/21 0628 08/28/21 0600 08/29/21 0603 08/30/21 0502  NA 140 140 141 140 141  K 3.6 3.6 3.4* 3.5 3.4*  CL 108 108 109 104 109  CO2 22  25 27 27 26   GLUCOSE 135* 118* 70 111* 147*  BUN 27* 26* 25* 27* 31*  CREATININE 1.32* 1.51* 1.64* 1.77* 1.69*  CALCIUM 8.7* 8.1* 8.1* 8.1* 7.9*  MG  --  1.8 1.9 1.9 2.0   GFR: Estimated Creatinine Clearance: 37.9 mL/min (A) (by C-G formula based on SCr of 1.69 mg/dL (H)). Liver Function Tests: Recent Labs  Lab 08/26/21 0750  AST 56*  ALT 93*  ALKPHOS 84  BILITOT 0.6  PROT 6.7  ALBUMIN 3.8   No results for input(s): LIPASE, AMYLASE in the last 168 hours. No results for input(s): AMMONIA in the last 168 hours. Coagulation Profile: No results for input(s): INR, PROTIME in the last 168 hours. Cardiac Enzymes: No results  for input(s): CKTOTAL, CKMB, CKMBINDEX, TROPONINI in the last 168 hours. BNP (last 3 results) No results for input(s): PROBNP in the last 8760 hours. HbA1C: No results for input(s): HGBA1C in the last 72 hours.  CBG: Recent Labs  Lab 08/29/21 0734 08/29/21 1145 08/29/21 1619 08/29/21 1958 08/30/21 0729  GLUCAP 109* 151* 149* 123* 115*   Lipid Profile: No results for input(s): CHOL, HDL, LDLCALC, TRIG, CHOLHDL, LDLDIRECT in the last 72 hours.  Thyroid Function Tests: No results for input(s): TSH, T4TOTAL, FREET4, T3FREE, THYROIDAB in the last 72 hours. Anemia Panel: No results for input(s): VITAMINB12, FOLATE, FERRITIN, TIBC, IRON, RETICCTPCT in the last 72 hours. Sepsis Labs: Recent Labs  Lab 08/26/21 0750  PROCALCITON <0.10    Recent Results (from the past 240 hour(s))  Resp Panel by RT-PCR (Flu A&B, Covid) Nasopharyngeal Swab     Status: None   Collection Time: 08/26/21  8:21 AM   Specimen: Nasopharyngeal Swab; Nasopharyngeal(NP) swabs in vial transport medium  Result Value Ref Range Status   SARS Coronavirus 2 by RT PCR NEGATIVE NEGATIVE Final    Comment: (NOTE) SARS-CoV-2 target nucleic acids are NOT DETECTED.  The SARS-CoV-2 RNA is generally detectable in upper respiratory specimens during the acute phase of infection. The lowest concentration of SARS-CoV-2 viral copies this assay can detect is 138 copies/mL. A negative result does not preclude SARS-Cov-2 infection and should not be used as the sole basis for treatment or other patient management decisions. A negative result may occur with  improper specimen collection/handling, submission of specimen other than nasopharyngeal swab, presence of viral mutation(s) within the areas targeted by this assay, and inadequate number of viral copies(<138 copies/mL). A negative result must be combined with clinical observations, patient history, and epidemiological information. The expected result is Negative.  Fact  Sheet for Patients:  EntrepreneurPulse.com.au  Fact Sheet for Healthcare Providers:  IncredibleEmployment.be  This test is no t yet approved or cleared by the [place] FDA and  has been authorized for detection and/or diagnosis of SARS-CoV-2 by FDA under an Emergency Use Authorization (EUA). This EUA will remain  in effect (meaning this test can be used) for the duration of the COVID-19 declaration under Section 564(b)(1) of the Act, 21 U.S.C.section 360bbb-3(b)(1), unless the authorization is terminated  or revoked sooner.       Influenza A by PCR NEGATIVE NEGATIVE Final   Influenza B by PCR NEGATIVE NEGATIVE Final    Comment: (NOTE) The Xpert Xpress SARS-CoV-2/FLU/RSV plus assay is intended as an aid in the diagnosis of influenza from Nasopharyngeal swab specimens and should not be used as a sole basis for treatment. Nasal washings and aspirates are unacceptable for Xpert Xpress SARS-CoV-2/FLU/RSV testing.  Fact Sheet for Patients: EntrepreneurPulse.com.au  Fact  Sheet for Healthcare Providers: IncredibleEmployment.be  This test is not yet approved or cleared by the [place] and has been authorized for detection and/or diagnosis of SARS-CoV-2 by FDA under an Emergency Use Authorization (EUA). This EUA will remain in effect (meaning this test can be used) for the duration of the COVID-19 declaration under Section 564(b)(1) of the Act, 21 U.S.C. section 360bbb-3(b)(1), unless the authorization is terminated or revoked.  Performed at [facility], [address], [place], [place] [address]          Radiology Studies: No results found.      Scheduled Meds:  acyclovir  800 mg Oral Daily   aspirin EC  81 mg Oral Daily   budesonide (PULMICORT) nebulizer solution  0.25 mg Nebulization BID   clopidogrel  75 mg Oral Daily   enoxaparin (LOVENOX) injection  40 mg Subcutaneous  Q24H   hydrALAZINE  25 mg Oral TID   insulin aspart  0-5 Units Subcutaneous QHS   insulin aspart  0-9 Units Subcutaneous TID WC   insulin detemir  15 Units Subcutaneous BID   isosorbide mononitrate  30 mg Oral Daily   magnesium oxide  400 mg Oral Daily   multivitamin with minerals  1 tablet Oral Daily   oxybutynin  5 mg Oral BID   pantoprazole  40 mg Oral BID   rosuvastatin  40 mg Oral Daily   sodium chloride flush  3 mL Intravenous Q12H   Continuous Infusions:   LOS: 4 days    Time spent: 20 mins     Wyvonnia Dusky, MD [facility] Pager [phone]  If 7PM-7AM, please contact night-coverage 08/30/2021, 8:11 AM

## 2021-08-30 NOTE — H&P (View-Only) (Signed)
Progress Note  Patient Name: [name] Date of Encounter: 08/30/2021  [facility] Cardiologist: [name], MD   Subjective   Reports feeling well, denies shortness of breath Has been walking little bit around the room, no family at the bedside    Inpatient Medications    Scheduled Meds:  acyclovir  800 mg Oral Daily   aspirin EC  81 mg Oral Daily   budesonide (PULMICORT) nebulizer solution  0.25 mg Nebulization BID   clopidogrel  75 mg Oral Daily   enoxaparin (LOVENOX) injection  40 mg Subcutaneous Q24H   hydrALAZINE  25 mg Oral TID   insulin aspart  0-5 Units Subcutaneous QHS   insulin aspart  0-9 Units Subcutaneous TID WC   insulin detemir  15 Units Subcutaneous BID   isosorbide mononitrate  30 mg Oral Daily   magnesium oxide  400 mg Oral Daily   multivitamin with minerals  1 tablet Oral Daily   oxybutynin  5 mg Oral BID   pantoprazole  40 mg Oral BID   rosuvastatin  40 mg Oral Daily   sodium chloride flush  3 mL Intravenous Q12H   Continuous Infusions:  PRN Meds: acetaminophen, albuterol, benzonatate, dextromethorphan-guaiFENesin, hydrALAZINE, ondansetron (ZOFRAN) IV, phenol   Vital Signs    Vitals:   08/30/21 0621 08/30/21 0700 08/30/21 0731 08/30/21 1135  BP:   (!) 152/76 (!) 144/74  Pulse:   (!) 57 (!) 54  Resp:   18 18  Temp:   97.9 F (36.6 C) 97.8 F (36.6 C)  TempSrc:      SpO2:  97% 98% 99%  Weight: 103.4 kg     Height:       No intake or output data in the 24 hours ending 08/30/21 1443 Last 3 Weights 08/30/2021 08/29/2021 08/26/2021  Weight (lbs) 227 lb 14.4 oz 230 lb 3.2 oz 232 lb  Weight (kg) 103.375 kg 104.418 kg 105.235 kg      Telemetry    Normal sinus rhythm- Personally Reviewed  ECG      - Personally Reviewed  Physical Exam   GEN: No acute distress.   Neck: No JVD Cardiac: RRR, no murmurs, rubs, or gallops.  Respiratory: Clear to auscultation bilaterally. GI: Soft, nontender, non-distended  MS: No edema; No  deformity. Neuro:  Nonfocal  Psych: Normal affect   Labs    High Sensitivity Troponin:   Recent Labs  Lab 08/26/21 0750 08/26/21 0959 08/26/21 1415 08/26/21 1630  TROPONINIHS 22* 34* 58* 59*     Chemistry Recent Labs  Lab 08/26/21 0750 08/27/21 0628 08/28/21 0600 08/29/21 0603 08/30/21 0502  NA 140   < > 141 140 141  K 3.6   < > 3.4* 3.5 3.4*  CL 108   < > 109 104 109  CO2 22   < > 27 27 26   GLUCOSE 135*   < > 70 111* 147*  BUN 27*   < > 25* 27* 31*  CREATININE 1.32*   < > 1.64* 1.77* 1.69*  CALCIUM 8.7*   < > 8.1* 8.1* 7.9*  MG  --    < > 1.9 1.9 2.0  PROT 6.7  --   --   --   --   ALBUMIN 3.8  --   --   --   --   AST 56*  --   --   --   --   ALT 93*  --   --   --   --  ALKPHOS 84  --   --   --   --   BILITOT 0.6  --   --   --   --   GFRNONAA 53*   < > 41* 37* 40*  ANIONGAP 10   < > 5 9 6    < > = values in this interval not displayed.    Lipids  Recent Labs  Lab 08/27/21 0628  CHOL 114  TRIG 78  HDL 45  LDLCALC 53  CHOLHDL 2.5    Hematology Recent Labs  Lab 08/28/21 0600 08/29/21 0603 08/30/21 0502  WBC 6.2 5.0 5.1  RBC 4.08* 3.87* 3.79*  HGB 11.8* 11.4* 11.3*  HCT 37.0* 35.2* 34.5*  MCV 90.7 91.0 91.0  MCH 28.9 29.5 29.8  MCHC 31.9 32.4 32.8  RDW 14.0 14.0 14.0  PLT 198 171 168   Thyroid No results for input(s): TSH, FREET4 in the last 168 hours.  BNP Recent Labs  Lab 08/26/21 0750  BNP 1,040.2*    DDimer No results for input(s): DDIMER in the last 168 hours.   Radiology    No results found.  Cardiac Studies     Patient Profile     [name] is a 86 y.o. male with a history of VF arrest, CAD status post CABG x5 in October 2021, ischemic cardiomyopathy, heart failure with improved ejection fraction, hypertension, hyperlipidemia, diabetes, obesity, and stage III chronic kidney disease, who presetned 10/19 w/ progressive dyspnea, orthopnea, and volume overload.  Assessment & Plan    Acute on chronic diastolic and systolic  CHF ejection fraction January 2022 to 55% Echo this admission ejection fraction 35 to 40% presenting with acute on chronic diastolic and systolic CHF -Some concern from family concerning his medication compliance with Lasix 80 daily  Continue hydralazine, Imdur, further titration as blood pressure tolerates Beta-blocker on hold for bradycardia ACE ARB on hold secondary to renal dysfunction -IV Lasix on hold for pending catheterization tomorrow   Coronary artery disease with stable angina History of CABG x5 Elevated troponin, consider cardiac catheterization Monday for depressed ejection fraction 35 to 40%, concern for anterior wall hypokinesis Continue aspirin Plavix statin, not on beta-blocker secondary to bradycardia   Chronic kidney disease stage IIIb Typically range creatinine 1.3-1.7 Avoid NSAIDs Hold ACE ARB Entresto for now   Type 2 diabetes with complications V4U 7.8 Consider Farxiga/SGLT2 inhibitor, proven to help preserve kidney function protect from end-stage renal disease and cardiovascular death   Junctional bradycardia, secondary AV block type I Asymptomatic, heart rates in the 40s and 50s, avoid beta-blockers Ischemic work-up tomorrow Then consider Zio monitor and outpatient follow-up with EP   Total encounter time more than 25 minutes  Greater than 50% was spent in counseling and coordination of care with the patient    For questions or updates, please contact [facility] Please consult [email or web address] for contact info under        Signed, Ida Rogue, MD  08/30/2021, 2:43 PM

## 2021-08-31 ENCOUNTER — Encounter
Admission: EM | Disposition: A | Discharge status: Home Health | Payer: Self-pay | Source: Home / Self Care | Attending: Internal Medicine

## 2021-08-31 ENCOUNTER — Encounter: Payer: Self-pay | Admitting: Cardiovascular Disease

## 2021-08-31 DIAGNOSIS — I5023 Acute on chronic systolic (congestive) heart failure: Secondary | ICD-10-CM

## 2021-08-31 DIAGNOSIS — I25110 Atherosclerotic heart disease of native coronary artery with unstable angina pectoris: Secondary | ICD-10-CM | POA: Diagnosis not present

## 2021-08-31 DIAGNOSIS — I200 Unstable angina: Secondary | ICD-10-CM

## 2021-08-31 HISTORY — PX: RIGHT/LEFT HEART CATH AND CORONARY ANGIOGRAPHY: CATH118266

## 2021-08-31 LAB — CBC
HCT: 36.3 % — ABNORMAL LOW (ref 39.0–52.0)
Hemoglobin: 11.9 g/dL — ABNORMAL LOW (ref 13.0–17.0)
MCH: 30.1 pg (ref 26.0–34.0)
MCHC: 32.8 g/dL (ref 30.0–36.0)
MCV: 91.7 fL (ref 80.0–100.0)
Platelets: 182 10*3/uL (ref 150–400)
RBC: 3.96 MIL/uL — ABNORMAL LOW (ref 4.22–5.81)
RDW: 13.8 % (ref 11.5–15.5)
WBC: 5.3 10*3/uL (ref 4.0–10.5)
nRBC: 0 % (ref 0.0–0.2)

## 2021-08-31 LAB — BASIC METABOLIC PANEL
Anion gap: 5 (ref 5–15)
BUN: 32 mg/dL — ABNORMAL HIGH (ref 8–23)
CO2: 27 mmol/L (ref 22–32)
Calcium: 8.3 mg/dL — ABNORMAL LOW (ref 8.9–10.3)
Chloride: 108 mmol/L (ref 98–111)
Creatinine, Ser: 1.69 mg/dL — ABNORMAL HIGH (ref 0.61–1.24)
GFR, Estimated: 40 mL/min — ABNORMAL LOW (ref >60)
Glucose, Bld: 88 mg/dL (ref 70–99)
Potassium: 3.6 mmol/L (ref 3.5–5.1)
Sodium: 140 mmol/L (ref 135–145)

## 2021-08-31 LAB — MAGNESIUM: Magnesium: 2.3 mg/dL (ref 1.7–2.4)

## 2021-08-31 LAB — GLUCOSE, CAPILLARY
Glucose-Capillary: 81 mg/dL (ref 70–99)
Glucose-Capillary: 79 mg/dL (ref 70–99)
Glucose-Capillary: 75 mg/dL (ref 70–99)
Glucose-Capillary: 160 mg/dL — ABNORMAL HIGH (ref 70–99)
Glucose-Capillary: 124 mg/dL — ABNORMAL HIGH (ref 70–99)

## 2021-08-31 SURGERY — RIGHT/LEFT HEART CATH AND CORONARY ANGIOGRAPHY
Anesthesia: Moderate Sedation

## 2021-08-31 MED ORDER — VERAPAMIL HCL 2.5 MG/ML IV SOLN
INTRAVENOUS | Status: DC | PRN
  Administered 2021-08-31: 2.5 mg via INTRAVENOUS

## 2021-08-31 MED ORDER — SODIUM CHLORIDE 0.9% FLUSH: 3.0000 mL | INTRAVENOUS | Status: DC | PRN

## 2021-08-31 MED ORDER — HEPARIN (PORCINE) IN NACL 1000-0.9 UT/500ML-% IV SOLN
INTRAVENOUS | Status: DC | PRN
  Administered 2021-08-31 (×2): 500 mL

## 2021-08-31 MED ORDER — VERAPAMIL HCL 2.5 MG/ML IV SOLN
INTRAVENOUS | Status: AC
  Filled 2021-08-31: qty 2

## 2021-08-31 MED ORDER — MIDAZOLAM HCL 2 MG/2ML IJ SOLN
INTRAMUSCULAR | Status: AC
  Filled 2021-08-31: qty 2

## 2021-08-31 MED ORDER — MIDAZOLAM HCL 2 MG/2ML IJ SOLN
INTRAMUSCULAR | Status: DC | PRN
  Administered 2021-08-31: 1 mg via INTRAVENOUS

## 2021-08-31 MED ORDER — SODIUM CHLORIDE FLUSH 0.9 % IV SOLN
INTRAVENOUS | Status: AC
  Administered 2021-08-31: 10 mL
  Filled 2021-08-31: qty 10

## 2021-08-31 MED ORDER — SODIUM CHLORIDE 0.9 % IV SOLN: INTRAVENOUS | Status: AC

## 2021-08-31 MED ORDER — HEPARIN SODIUM (PORCINE) 1000 UNIT/ML IJ SOLN
INTRAMUSCULAR | Status: AC
  Filled 2021-08-31: qty 1

## 2021-08-31 MED ORDER — IOHEXOL 350 MG/ML SOLN
INTRAVENOUS | Status: DC | PRN
  Administered 2021-08-31: 55 mL

## 2021-08-31 MED ORDER — FENTANYL CITRATE (PF) 100 MCG/2ML IJ SOLN
INTRAMUSCULAR | Status: DC | PRN
  Administered 2021-08-31: 25 ug via INTRAVENOUS

## 2021-08-31 MED ORDER — LIDOCAINE HCL (PF) 1 % IJ SOLN
INTRAMUSCULAR | Status: DC | PRN
  Administered 2021-08-31 (×2): 2 mL

## 2021-08-31 MED ORDER — HEPARIN (PORCINE) IN NACL 1000-0.9 UT/500ML-% IV SOLN
INTRAVENOUS | Status: AC
  Filled 2021-08-31: qty 1000

## 2021-08-31 MED ORDER — HEPARIN SODIUM (PORCINE) 1000 UNIT/ML IJ SOLN
INTRAMUSCULAR | Status: DC | PRN
  Administered 2021-08-31: 6000 [IU] via INTRAVENOUS

## 2021-08-31 MED ORDER — FENTANYL CITRATE (PF) 100 MCG/2ML IJ SOLN
INTRAMUSCULAR | Status: AC
  Filled 2021-08-31: qty 2

## 2021-08-31 MED ORDER — SODIUM CHLORIDE 0.9% FLUSH
3.0000 mL | Freq: Two times a day (BID) | INTRAVENOUS | Status: DC
  Administered 2021-08-31 – 2021-09-01 (×3): 3 mL via INTRAVENOUS

## 2021-08-31 MED ORDER — LIDOCAINE HCL 1 % IJ SOLN
INTRAMUSCULAR | Status: AC
  Filled 2021-08-31: qty 20

## 2021-08-31 MED ORDER — SODIUM CHLORIDE 0.9 % IV SOLN: 250.0000 mL | INTRAVENOUS | Status: DC | PRN

## 2021-08-31 SURGICAL SUPPLY — 16 items
CATH 5F 110X4 TIG (CATHETERS) ×2 IMPLANT
CATH BALLN WEDGE 5F 110CM (CATHETERS) ×2 IMPLANT
CATH INFINITI 5 FR IM (CATHETERS) ×2 IMPLANT
CATH INFINITI 5 FR MPA2 (CATHETERS) ×2 IMPLANT
DEVICE RAD COMP TR BAND LRG (VASCULAR PRODUCTS) ×2 IMPLANT
DRAPE BRACHIAL (DRAPES) ×4 IMPLANT
GLIDESHEATH SLEND SS 6F .021 (SHEATH) ×2 IMPLANT
GUIDEWIRE .025 260CM (WIRE) ×2 IMPLANT
GUIDEWIRE INQWIRE 1.5J.035X260 (WIRE) ×1 IMPLANT
INQWIRE 1.5J .035X260CM (WIRE) ×2
KIT SYRINGE INJ CVI SPIKEX1 (MISCELLANEOUS) ×2 IMPLANT
PACK CARDIAC CATH (CUSTOM PROCEDURE TRAY) ×2 IMPLANT
PROTECTION STATION PRESSURIZED (MISCELLANEOUS) ×2
SET ATX SIMPLICITY (MISCELLANEOUS) ×2 IMPLANT
SHEATH GLIDE SLENDER 4/5FR (SHEATH) ×2 IMPLANT
STATION PROTECTION PRESSURIZED (MISCELLANEOUS) ×1 IMPLANT

## 2021-08-31 NOTE — Progress Notes (Signed)
Progress Note  Patient Name: [name] Date of Encounter: 08/31/2021  [facility] Cardiologist: [name], MD   Subjective   Right and left cardiac catheterization was done this morning and showed normal filling pressures with minimal pulmonary hypertension.  Coronary angiography showed severe underlying three-vessel coronary artery disease with patent grafts.  The patient is supposed to be on furosemide 80 mg by mouth daily but he does admit to missing few days of the medication before hospitalization.   Inpatient Medications    Scheduled Meds:  acyclovir  800 mg Oral Daily   aspirin EC  81 mg Oral Daily   budesonide (PULMICORT) nebulizer solution  0.25 mg Nebulization BID   clopidogrel  75 mg Oral Daily   enoxaparin (LOVENOX) injection  40 mg Subcutaneous Q24H   hydrALAZINE  25 mg Oral TID   insulin aspart  0-5 Units Subcutaneous QHS   insulin aspart  0-9 Units Subcutaneous TID WC   insulin detemir  15 Units Subcutaneous BID   isosorbide mononitrate  30 mg Oral Daily   magnesium oxide  400 mg Oral Daily   multivitamin with minerals  1 tablet Oral Daily   oxybutynin  5 mg Oral BID   pantoprazole  40 mg Oral BID   rosuvastatin  40 mg Oral Daily   sodium chloride flush  3 mL Intravenous Q12H   sodium chloride flush  3 mL Intravenous Q12H   Continuous Infusions:  sodium chloride 75 mL/hr at 08/31/21 1248   sodium chloride     PRN Meds: sodium chloride, acetaminophen, albuterol, benzonatate, dextromethorphan-guaiFENesin, hydrALAZINE, ondansetron (ZOFRAN) IV, phenol, sodium chloride flush   Vital Signs    Vitals:   08/31/21 1130 08/31/21 1145 08/31/21 1204 08/31/21 1248  BP: 124/73  138/67 122/66  Pulse: (!) 146 (!) 51 (!) 54 (!) 58  Resp: 18 17 20 20   Temp:   98.1 F (36.7 C)   TempSrc:   Oral   SpO2: 96% 96% 100% 98%  Weight:      Height:        Intake/Output Summary (Last 24 hours) at 08/31/2021 1426 Last data filed at 08/30/2021 1853 Gross per  24 hour  Intake 480 ml  Output --  Net 480 ml   Last 3 Weights 08/31/2021 08/30/2021 08/29/2021  Weight (lbs) 225 lb 1.4 oz 227 lb 14.4 oz 230 lb 3.2 oz  Weight (kg) 102.1 kg 103.375 kg 104.418 kg      Telemetry    Normal sinus rhythm with intermittent sinus bradycardia- Personally Reviewed  ECG      - Personally Reviewed  Physical Exam   GEN: No acute distress.   Neck: No JVD Cardiac: RRR, no murmurs, rubs, or gallops.  Respiratory: Clear to auscultation bilaterally. GI: Soft, nontender, non-distended  MS: No edema; No deformity. Neuro:  Nonfocal  Psych: Normal affect   Labs    High Sensitivity Troponin:   Recent Labs  Lab 08/26/21 0750 08/26/21 0959 08/26/21 1415 08/26/21 1630  TROPONINIHS 22* 34* 58* 59*      Chemistry Recent Labs  Lab 08/26/21 0750 08/27/21 0628 08/29/21 0603 08/30/21 0502 08/31/21 0535  NA 140   < > 140 141 140  K 3.6   < > 3.5 3.4* 3.6  CL 108   < > 104 109 108  CO2 22   < > 27 26 27   GLUCOSE 135*   < > 111* 147* 88  BUN 27*   < > 27* 31* 32*  CREATININE 1.32*   < > 1.77* 1.69* 1.69*  CALCIUM 8.7*   < > 8.1* 7.9* 8.3*  MG  --    < > 1.9 2.0 2.3  PROT 6.7  --   --   --   --   ALBUMIN 3.8  --   --   --   --   AST 56*  --   --   --   --   ALT 93*  --   --   --   --   ALKPHOS 84  --   --   --   --   BILITOT 0.6  --   --   --   --   GFRNONAA 53*   < > 37* 40* 40*  ANIONGAP 10   < > 9 6 5    < > = values in this interval not displayed.     Lipids  Recent Labs  Lab 08/27/21 0628  CHOL 114  TRIG 78  HDL 45  LDLCALC 53  CHOLHDL 2.5     Hematology Recent Labs  Lab 08/29/21 0603 08/30/21 0502 08/31/21 0535  WBC 5.0 5.1 5.3  RBC 3.87* 3.79* 3.96*  HGB 11.4* 11.3* 11.9*  HCT 35.2* 34.5* 36.3*  MCV 91.0 91.0 91.7  MCH 29.5 29.8 30.1  MCHC 32.4 32.8 32.8  RDW 14.0 14.0 13.8  PLT 171 168 182    Thyroid No results for input(s): TSH, FREET4 in the last 168 hours.  BNP Recent Labs  Lab 08/26/21 0750  BNP  1,040.2*     DDimer No results for input(s): DDIMER in the last 168 hours.   Radiology    CARDIAC CATHETERIZATION  Result Date: 08/31/2021 1.  Severe underlying three-vessel coronary artery disease with patent grafts including LIMA to LAD, SVG to large diagonal, sequential SVG to OM /distal left circumflex and SVG to right PDA. 2.  Right heart catheterization showed normal right and left-sided filling pressures, minimal pulmonary hypertension and normal cardiac output. Recommendations: Continue medical therapy for coronary artery disease and congestive heart failure. Volume status appears optimal at this time.    Cardiac Studies     Patient Profile     [name] is a 86 y.o. male with a history of VF arrest, CAD status post CABG x5 in October 2021, ischemic cardiomyopathy, heart failure with improved ejection fraction, hypertension, hyperlipidemia, diabetes, obesity, and stage III chronic kidney disease, who presetned 10/19 w/ progressive dyspnea, orthopnea, and volume overload.  Assessment & Plan    Acute on chronic diastolic and systolic CHF ejection fraction January 2022 to 55% Echo this admission ejection fraction 35 to 40% presenting with acute on chronic diastolic and systolic CHF -Some concern from family concerning his medication compliance with Lasix 80 daily  Continue hydralazine, Imdur, further titration as blood pressure tolerates No beta-blockers due to underlying bradycardia.   No ACE/ARB/spironolactone due to chronic kidney disease and previous hyperkalemia. -Right heart catheterization today showed normal filling pressures and minimal pulmonary hypertension.  Recommend resuming oral furosemide 80 mg once daily tomorrow. We will consider adding an SGLT2 inhibitor as an outpatient.   Coronary artery disease with stable angina History of CABG x5 Elevated troponin, consider cardiac catheterization Monday for depressed ejection fraction 35 to 40%, concern for anterior  wall hypokinesis Continue aspirin Pla cardiac catheterization today showed patent grafts.  Continue medical therapy.  vix statin, not on beta-blocker secondary to bradycardia   Chronic kidney disease stage IIIb Typically range creatinine 1.3-1.7 Avoid  NSAIDs Hold ACE ARB Entresto for now   Type 2 diabetes with complications I2M 7.8 Consider Farxiga/SGLT2 inhibitor, proven to help preserve kidney function protect from end-stage renal disease and cardiovascular death   Junctional bradycardia, secondary AV block type I Asymptomatic, heart rates in the 40s and 50s, avoid beta-blockers But patient did have intermittent bradycardia to the low 40s during cardiac catheterization. Recommend 2-week ZIO monitor before hospital discharge and outpatient consultation with the EP.  The patient can likely be discharged home tomorrow.   Total encounter time more than 25 minutes  Greater than 50% was spent in counseling and coordination of care with the patient    For questions or updates, please contact [place] Please consult [email or web address] for contact info under        Signed, [name], MD  08/31/2021, 2:26 PM

## 2021-08-31 NOTE — Progress Notes (Signed)
OT Cancellation Note  Patient Details Name: Cahlil Sattar MRN: [id number] DOB: Nov 21, 1934   Cancelled Treatment:    Reason Eval/Treat Not Completed: Patient at procedure or test/ unavailable. Pt currently off the unit at cath lab. OT to re-attempt when pt is next available.  Darleen Crocker, MS, OTR/L , CBIS ascom [phone]  08/31/21, 9:16 AM

## 2021-08-31 NOTE — Progress Notes (Signed)
PT Cancellation Note  Patient Details Name: [name] MRN: [id number] DOB: 11-Sep-1935   Cancelled Treatment:    Reason Eval/Treat Not Completed: Other (comment). Pt currently off unit for cath procedure. Will re-attempt next available date when medically stable.   Kallan Merrick 08/31/2021, 9:39 AM Greggory Stallion, PT, DPT [phone]

## 2021-08-31 NOTE — Care Management Important Message (Signed)
Important Message  Patient Details  Name: Logun Colavito MRN: [id number] Date of Birth: 01-05-1935   Medicare Important Message Given:  Yes     Dannette Barbara 08/31/2021, 4:02 PM

## 2021-08-31 NOTE — Progress Notes (Signed)
PROGRESS NOTE    [name]  BOF:[id number] DOB: Oct 23, 1935 DOA: 08/26/2021 PCP: Tomasita Morrow, MD   Assessment & Plan:   Principal Problem:   Acute on chronic diastolic CHF (congestive heart failure) ([place]) Active Problems:   HTN (hypertension)   Stage 3a chronic kidney disease (HCC)   CAD (coronary artery disease)   HLD (hyperlipidemia)   GERD (gastroesophageal reflux disease)   Type II diabetes mellitus with renal manifestations (HCC)    Acute on chronic combined CHF: repeat echo shows EF 35-40%, LV global hypokinesis, diastolic function was indeterminate.  Has leg edema, positive JVD, elevated BNP 1040, clinically consistent with CHF exacerbation. Continue to hold lasix secondary to AKI on CKD. Monitor I/Os and daily weights. Cardiac cath today    Bradycardia: w/ Mobitz I block as per cardio. Asymptomatic but drops into 30s. Continue on to hold CCB. EP will likely see pt on 09/01/21. Continue on tele. Cardio recs apprec   HTN: continue on imdur, hydralazine as per cardio. IV hydralazine prn   AKI on CKDIIIa: baseline Cr 1.62. Cr is stable from day prior    CAD: w/ elevated troponin. S/p CABG. Cardiac cath today   HLD: continue on statin   GERD: continue on PPI    DM2: HbA1c 7.8, poorly controlled. Continue on levemir, SSI w/ accuchecks  Thrombocytopenia: resolved    Morbid obesity: BMI 34.2. Complicates overall care & prognosis     DVT prophylaxis: lovenox  Code Status: full  Family Communication:  Disposition Plan: likely d/c back home   Level of care: Progressive Cardiac  Status is: Inpatient  Remains inpatient appropriate because: severity of illness    Consultants:  Cardio   Procedures:  Antimicrobials:    Subjective: Pt c/o malaise   Objective: Vitals:   08/31/21 0001 08/31/21 0215 08/31/21 0516 08/31/21 0735  BP: 124/87 137/72  (!) 149/68  Pulse: (!) 40 (!) 49  68  Resp: 16 16  17   Temp: 98.3 F (36.8 C) 98.2 F (36.8 C)   98.7 F (37.1 C)  TempSrc:      SpO2: 99% 98%  99%  Weight:   102.1 kg   Height:        Intake/Output Summary (Last 24 hours) at 08/31/2021 0740 Last data filed at 08/30/2021 1853 Gross per 24 hour  Intake 480 ml  Output --  Net 480 ml   Filed Weights   08/29/21 0522 08/30/21 0621 08/31/21 0516  Weight: 104.4 kg 103.4 kg 102.1 kg    Examination:  General exam: Appears comfortable  Respiratory system: decreased breath sounds b/l. No rhonchi  Cardiovascular system: S1 & S2+. No rubs or clicks  Gastrointestinal system: Abd is soft, NT, obese & hypoactive bowel sounds  Central nervous system: alert and oriented. Moves all extremities  Psychiatry: Judgement and insight appear normal. Flat mood and affect   Data Reviewed: I have personally reviewed following labs and imaging studies  CBC: Recent Labs  Lab 08/26/21 0750 08/28/21 0600 08/29/21 0603 08/30/21 0502 08/31/21 0535  WBC 6.2 6.2 5.0 5.1 5.3  HGB 15.6 11.8* 11.4* 11.3* 11.9*  HCT 48.6 37.0* 35.2* 34.5* 36.3*  MCV 92.2 90.7 91.0 91.0 91.7  PLT 138* 198 171 168 778   Basic Metabolic Panel: Recent Labs  Lab 08/27/21 0628 08/28/21 0600 08/29/21 0603 08/30/21 0502 08/31/21 0535  NA 140 141 140 141 140  K 3.6 3.4* 3.5 3.4* 3.6  CL 108 109 104 109 108  CO2 25 27 27  26  27  GLUCOSE 118* 70 111* 147* 88  BUN 26* 25* 27* 31* 32*  CREATININE 1.51* 1.64* 1.77* 1.69* 1.69*  CALCIUM 8.1* 8.1* 8.1* 7.9* 8.3*  MG 1.8 1.9 1.9 2.0 2.3   GFR: Estimated Creatinine Clearance: 37.7 mL/min (A) (by C-G formula based on SCr of 1.69 mg/dL (H)). Liver Function Tests: Recent Labs  Lab 08/26/21 0750  AST 56*  ALT 93*  ALKPHOS 84  BILITOT 0.6  PROT 6.7  ALBUMIN 3.8   No results for input(s): LIPASE, AMYLASE in the last 168 hours. No results for input(s): AMMONIA in the last 168 hours. Coagulation Profile: No results for input(s): INR, PROTIME in the last 168 hours. Cardiac Enzymes: No results for input(s): CKTOTAL,  CKMB, CKMBINDEX, TROPONINI in the last 168 hours. BNP (last 3 results) No results for input(s): PROBNP in the last 8760 hours. HbA1C: No results for input(s): HGBA1C in the last 72 hours.  CBG: Recent Labs  Lab 08/29/21 1958 08/30/21 0729 08/30/21 1134 08/30/21 1640 08/30/21 2103  GLUCAP 123* 115* 176* 118* 199*   Lipid Profile: No results for input(s): CHOL, HDL, LDLCALC, TRIG, CHOLHDL, LDLDIRECT in the last 72 hours.  Thyroid Function Tests: No results for input(s): TSH, T4TOTAL, FREET4, T3FREE, THYROIDAB in the last 72 hours. Anemia Panel: No results for input(s): VITAMINB12, FOLATE, FERRITIN, TIBC, IRON, RETICCTPCT in the last 72 hours. Sepsis Labs: Recent Labs  Lab 08/26/21 0750  PROCALCITON <0.10    Recent Results (from the past 240 hour(s))  Resp Panel by RT-PCR (Flu A&B, Covid) Nasopharyngeal Swab     Status: None   Collection Time: 08/26/21  8:21 AM   Specimen: Nasopharyngeal Swab; Nasopharyngeal(NP) swabs in vial transport medium  Result Value Ref Range Status   SARS Coronavirus 2 by RT PCR NEGATIVE NEGATIVE Final    Comment: (NOTE) SARS-CoV-2 target nucleic acids are NOT DETECTED.  The SARS-CoV-2 RNA is generally detectable in upper respiratory specimens during the acute phase of infection. The lowest concentration of SARS-CoV-2 viral copies this assay can detect is 138 copies/mL. A negative result does not preclude SARS-Cov-2 infection and should not be used as the sole basis for treatment or other patient management decisions. A negative result may occur with  improper specimen collection/handling, submission of specimen other than nasopharyngeal swab, presence of viral mutation(s) within the areas targeted by this assay, and inadequate number of viral copies(<138 copies/mL). A negative result must be combined with clinical observations, patient history, and epidemiological information. The expected result is Negative.  Fact Sheet for Patients:   EntrepreneurPulse.com.au  Fact Sheet for Healthcare Providers:  IncredibleEmployment.be  This test is no t yet approved or cleared by the [place] FDA and  has been authorized for detection and/or diagnosis of SARS-CoV-2 by FDA under an Emergency Use Authorization (EUA). This EUA will remain  in effect (meaning this test can be used) for the duration of the COVID-19 declaration under Section 564(b)(1) of the Act, 21 U.S.C.section 360bbb-3(b)(1), unless the authorization is terminated  or revoked sooner.       Influenza A by PCR NEGATIVE NEGATIVE Final   Influenza B by PCR NEGATIVE NEGATIVE Final    Comment: (NOTE) The Xpert Xpress SARS-CoV-2/FLU/RSV plus assay is intended as an aid in the diagnosis of influenza from Nasopharyngeal swab specimens and should not be used as a sole basis for treatment. Nasal washings and aspirates are unacceptable for Xpert Xpress SARS-CoV-2/FLU/RSV testing.  Fact Sheet for Patients: EntrepreneurPulse.com.au  Fact Sheet for Healthcare Providers: IncredibleEmployment.be  This test is not yet approved or cleared by the [place] and has been authorized for detection and/or diagnosis of SARS-CoV-2 by FDA under an Emergency Use Authorization (EUA). This EUA will remain in effect (meaning this test can be used) for the duration of the COVID-19 declaration under Section 564(b)(1) of the Act, 21 U.S.C. section 360bbb-3(b)(1), unless the authorization is terminated or revoked.  Performed at [facility], [address] [place], [place] [address]          Radiology Studies: No results found.      Scheduled Meds:  acyclovir  800 mg Oral Daily   aspirin  81 mg Oral Pre-Cath   aspirin EC  81 mg Oral Daily   budesonide (PULMICORT) nebulizer solution  0.25 mg Nebulization BID   clopidogrel  75 mg Oral Daily   enoxaparin (LOVENOX) injection  40 mg  Subcutaneous Q24H   hydrALAZINE  25 mg Oral TID   insulin aspart  0-5 Units Subcutaneous QHS   insulin aspart  0-9 Units Subcutaneous TID WC   insulin detemir  15 Units Subcutaneous BID   isosorbide mononitrate  30 mg Oral Daily   magnesium oxide  400 mg Oral Daily   multivitamin with minerals  1 tablet Oral Daily   oxybutynin  5 mg Oral BID   pantoprazole  40 mg Oral BID   rosuvastatin  40 mg Oral Daily   sodium chloride flush  3 mL Intravenous Q12H   Continuous Infusions:  sodium chloride     sodium chloride 10 mL/hr at 08/31/21 0130     LOS: 5 days    Time spent: 20 mins     Wyvonnia Dusky, MD [facility] Pager [phone]  If 7PM-7AM, please contact night-coverage 08/31/2021, 7:40 AM

## 2021-08-31 NOTE — Interval H&P Note (Signed)
Cath Lab Visit (complete for each Cath Lab visit)  Clinical Evaluation Leading to the Procedure:   ACS: Yes.   Unstable angina  Non-ACS:   Na/  CHF: yes. NYHA class 4    History and Physical Interval Note:  08/31/2021 9:03 AM  [name]  has presented today for surgery, with the diagnosis of ischemic cardiomyopathy; acute on chronic heart failure with reduced ejection fraction.  The various methods of treatment have been discussed with the patient and family. After consideration of risks, benefits and other options for treatment, the patient has consented to  Procedure(s): RIGHT/LEFT HEART CATH AND CORONARY ANGIOGRAPHY (N/A) as a surgical intervention.  The patient's history has been reviewed, patient examined, no change in status, stable for surgery.  I have reviewed the patient's chart and labs.  Questions were answered to the patient's satisfaction.     [name]

## 2021-09-01 ENCOUNTER — Telehealth: Payer: Self-pay | Admitting: *Deleted

## 2021-09-01 ENCOUNTER — Inpatient Hospital Stay
Admit: 2021-09-01 | Discharge: 2021-09-01 | Disposition: A | Discharge status: Home / Self Care | Payer: Medicare HMO | Attending: Medical | Admitting: Medical

## 2021-09-01 DIAGNOSIS — I5043 Acute on chronic combined systolic (congestive) and diastolic (congestive) heart failure: Secondary | ICD-10-CM

## 2021-09-01 DIAGNOSIS — R001 Bradycardia, unspecified: Secondary | ICD-10-CM

## 2021-09-01 DIAGNOSIS — I5023 Acute on chronic systolic (congestive) heart failure: Secondary | ICD-10-CM | POA: Diagnosis not present

## 2021-09-01 LAB — BASIC METABOLIC PANEL
Anion gap: 10 (ref 5–15)
BUN: 28 mg/dL — ABNORMAL HIGH (ref 8–23)
CO2: 23 mmol/L (ref 22–32)
Calcium: 8.2 mg/dL — ABNORMAL LOW (ref 8.9–10.3)
Chloride: 107 mmol/L (ref 98–111)
Creatinine, Ser: 1.66 mg/dL — ABNORMAL HIGH (ref 0.61–1.24)
GFR, Estimated: 40 mL/min — ABNORMAL LOW (ref >60)
Glucose, Bld: 185 mg/dL — ABNORMAL HIGH (ref 70–99)
Potassium: 3.8 mmol/L (ref 3.5–5.1)
Sodium: 140 mmol/L (ref 135–145)

## 2021-09-01 LAB — CBC
HCT: 35.3 % — ABNORMAL LOW (ref 39.0–52.0)
Hemoglobin: 11.1 g/dL — ABNORMAL LOW (ref 13.0–17.0)
MCH: 28.8 pg (ref 26.0–34.0)
MCHC: 31.4 g/dL (ref 30.0–36.0)
MCV: 91.5 fL (ref 80.0–100.0)
Platelets: 173 10*3/uL (ref 150–400)
RBC: 3.86 MIL/uL — ABNORMAL LOW (ref 4.22–5.81)
RDW: 14.1 % (ref 11.5–15.5)
WBC: 5.1 10*3/uL (ref 4.0–10.5)
nRBC: 0 % (ref 0.0–0.2)

## 2021-09-01 LAB — GLUCOSE, CAPILLARY
Glucose-Capillary: 119 mg/dL — ABNORMAL HIGH (ref 70–99)
Glucose-Capillary: 149 mg/dL — ABNORMAL HIGH (ref 70–99)

## 2021-09-01 LAB — MAGNESIUM: Magnesium: 2.1 mg/dL (ref 1.7–2.4)

## 2021-09-01 MED ORDER — FUROSEMIDE 40 MG PO TABS: 80.0000 mg | ORAL_TABLET | Freq: Every day | ORAL | Status: DC

## 2021-09-01 MED ORDER — ISOSORBIDE MONONITRATE ER 30 MG PO TB24: 30.0000 mg | ORAL_TABLET | Freq: Every day | ORAL | 0 refills | Status: DC

## 2021-09-01 NOTE — Progress Notes (Signed)
Occupational Therapy Treatment Patient Details Name: [name] MRN: [id number] DOB: 1935/06/06 Today's Date: 09/01/2021   History of present illness 86 y.o. male with medical history significant of dCHF, HTN, HLD, DM, GERD, CKD stage IIIa, CAD, CABG, cardiac arrest, who presents with shortness of breath.   OT comments  [name] seen for OT treatment on this date. Upon arrival to room pt easily awoken. Pt in bed and agreeable to tx. Pt instructed in ECS and safe DME use. Pt requires SUP + RW + VCs for safety during functional mobility to sink. SUP + RW needed during upper body grooming, facewashing, toothbrushing, hairbrushing, standing sinkside, ~ 10 mins, VCs for positioning RW during grooming tasks. Pt IND for LB dressing, donning socks, EOB. Pt making good progress toward goals. Pt continues to benefit from skilled OT services to maximize return to PLOF and minimize risk of future falls, injury, caregiver burden, and readmission. Will continue to follow POC. Discharge recommendation remains appropriate.     Recommendations for follow up therapy are one component of a multi-disciplinary discharge planning process, led by the attending physician.  Recommendations may be updated based on patient status, additional functional criteria and insurance authorization.    Follow Up Recommendations  Home health OT    Assistance Recommended at Discharge Intermittent Supervision/Assistance  Equipment Recommendations   (RW)    Recommendations for Other Services      Precautions / Restrictions Precautions Precautions: Fall Restrictions Weight Bearing Restrictions: No       Mobility Bed Mobility Overal bed mobility: Modified Independent             General bed mobility comments: HOB elevated and increased time    Transfers Overall transfer level: Needs assistance Equipment used: Rolling walker (2 wheels) Transfers: Sit to/from Stand Sit to Stand: Supervision            General transfer comment: w/ VCs for RW safety     Balance Overall balance assessment: Needs assistance Sitting-balance support: Feet supported;No upper extremity supported Sitting balance-[name]: Good     Standing balance support: No upper extremity supported;During functional activity Standing balance-[name]: Good                             ADL either performed or assessed with clinical judgement   ADL Overall ADL's : Needs assistance/impaired                                       General ADL Comments: Pt requires SUP + RW + VCs for safety, functional mobility to sink. SUP + RW during upper body grooming, facewashing, toothbrushing, hairbrushing, standing sinkside, ~ 10 mins, VCs for positioning RW during grooming tasks. Pt IND for LB dressing, donning socks, EOB.     Vision       Perception     Praxis      Cognition Arousal/Alertness: Awake/alert Behavior During Therapy: WFL for tasks assessed/performed Overall Cognitive Status: Within Functional Limits for tasks assessed                                 General Comments: Pt very pleasant          Exercises Exercises: Other exercises Other Exercises Other Exercises: Pt educ re: OT role, safe use of DME,  d/c recs, home safety with DME, concerns about transition to home Other Exercises: sup>sit, sit<>stand x 2 (bed to sink to bed, bed to chair), facewashing, toothbrushing, hairbrushing   Shoulder Instructions       General Comments      Pertinent Vitals/ Pain       Pain Assessment: No/denies pain  Home Living                                          Prior Functioning/Environment              Frequency  Min 2X/week        Progress Toward Goals  OT Goals(current goals can now be found in the care plan section)  Progress towards OT goals: Progressing toward goals  Acute Rehab OT Goals OT Goal Formulation: With  patient Time For Goal Achievement: 09/11/21 Potential to Achieve Goals: Good ADL Goals Pt Will Perform Grooming: Independently;standing Pt Will Perform Lower Body Dressing: sit to/from stand;Independently Pt Will Transfer to Toilet: Independently;ambulating Pt Will Perform Toileting - Clothing Manipulation and hygiene: Independently;sit to/from stand  Plan Discharge plan remains appropriate    Co-evaluation                 AM-PAC OT "6 Clicks" Daily Activity     Outcome Measure   Help from another person eating meals?: None Help from another person taking care of personal grooming?: None Help from another person toileting, which includes using toliet, bedpan, or urinal?: A Little Help from another person bathing (including washing, rinsing, drying)?: A Little Help from another person to put on and taking off regular upper body clothing?: None Help from another person to put on and taking off regular lower body clothing?: A Little 6 Click Score: 21    End of Session Equipment Utilized During Treatment: Rolling walker (2 wheels)  OT Visit Diagnosis: Unsteadiness on feet (R26.81);Muscle weakness (generalized) (M62.81)   Activity Tolerance Patient tolerated treatment well   Patient Left in chair;with call bell/phone within reach   Nurse Communication          Time: 3875-6433 OT Time Calculation (min): 20 min  Charges: OT General Charges $OT Visit: 1 Visit OT Treatments $Self Care/Home Management : 8-22 mins  Nino Glow, Markus Daft 09/01/2021, 11:31 AM

## 2021-09-01 NOTE — Telephone Encounter (Signed)
TCM....  Patient is being discharged      They are scheduled to see Sharolyn Douglas 11/8 at 1055     They need to be seen within 1-2 weeks      Please call

## 2021-09-01 NOTE — Telephone Encounter (Signed)
-----   Message from Nelva Bush, MD sent at 09/01/2021  1:23 PM EDT ----- Regarding: TCM follow-up Hello,  Could you help arrange for TCM follow-up visit with Dr. Fletcher Anon or an APP in 1-2 weeks?  Patient can hopefully be discharged this afternoon.  Thanks.  Gerald Stabs

## 2021-09-01 NOTE — Progress Notes (Signed)
Progress Note  Patient Name: [name] Date of Encounter: 09/01/2021  [facility] Cardiologist: [name], MD   Subjective   Patient feels well with breathing almost back to normal.  No chest pain or edema reported.  No issues with left arm catheterization sites.  Inpatient Medications    Scheduled Meds:  acyclovir  800 mg Oral Daily   aspirin EC  81 mg Oral Daily   clopidogrel  75 mg Oral Daily   enoxaparin (LOVENOX) injection  40 mg Subcutaneous Q24H   hydrALAZINE  25 mg Oral TID   insulin aspart  0-5 Units Subcutaneous QHS   insulin aspart  0-9 Units Subcutaneous TID WC   insulin detemir  15 Units Subcutaneous BID   isosorbide mononitrate  30 mg Oral Daily   magnesium oxide  400 mg Oral Daily   multivitamin with minerals  1 tablet Oral Daily   oxybutynin  5 mg Oral BID   pantoprazole  40 mg Oral BID   rosuvastatin  40 mg Oral Daily   sodium chloride flush  3 mL Intravenous Q12H   sodium chloride flush  3 mL Intravenous Q12H   Continuous Infusions:  sodium chloride     PRN Meds: sodium chloride, acetaminophen, albuterol, benzonatate, dextromethorphan-guaiFENesin, hydrALAZINE, ondansetron (ZOFRAN) IV, phenol, sodium chloride flush   Vital Signs    Vitals:   09/01/21 0503 09/01/21 0728 09/01/21 0733 09/01/21 1046  BP: 139/66 (!) 149/72  (!) 166/90  Pulse: (!) 45 (!) 49 (!) 50 (!) 56  Resp: 18 16 16 17   Temp: 98.4 F (36.9 C) (!) 97.3 F (36.3 C)  98 F (36.7 C)  TempSrc: Oral     SpO2: 98% 98% 97% 98%  Weight: 103.8 kg     Height:        Intake/Output Summary (Last 24 hours) at 09/01/2021 1323 Last data filed at 09/01/2021 1000 Gross per 24 hour  Intake 1552.65 ml  Output 740 ml  Net 812.65 ml   Last 3 Weights 09/01/2021 08/31/2021 08/30/2021  Weight (lbs) 228 lb 13.4 oz 225 lb 1.4 oz 227 lb 14.4 oz  Weight (kg) 103.8 kg 102.1 kg 103.375 kg      Telemetry    Sinus rhythm with intermittent Mobitz type I second-degree AV block and 2:1  AV block (asymptomatic) - Personally Reviewed  ECG    No new tracing - Personally Reviewed  Physical Exam   GEN: No acute distress.   Neck: No JVD Cardiac: RRR, no murmurs, rubs, or gallops.  Respiratory: Clear to auscultation bilaterally. GI: Soft, nontender, non-distended  MS: Trace pretibial edema; No deformity.  Left radial/brachial catheterization sites without hematoma.Covered with clean dressings. Neuro:  Nonfocal  Psych: Normal affect   Labs    High Sensitivity Troponin:   Recent Labs  Lab 08/26/21 0750 08/26/21 0959 08/26/21 1415 08/26/21 1630  TROPONINIHS 22* 34* 58* 59*     Chemistry Recent Labs  Lab 08/26/21 0750 08/27/21 0628 08/30/21 0502 08/31/21 0535 09/01/21 0423  NA 140   < > 141 140 140  K 3.6   < > 3.4* 3.6 3.8  CL 108   < > 109 108 107  CO2 22   < > 26 27 23   GLUCOSE 135*   < > 147* 88 185*  BUN 27*   < > 31* 32* 28*  CREATININE 1.32*   < > 1.69* 1.69* 1.66*  CALCIUM 8.7*   < > 7.9* 8.3* 8.2*  MG  --    < >  2.0 2.3 2.1  PROT 6.7  --   --   --   --   ALBUMIN 3.8  --   --   --   --   AST 56*  --   --   --   --   ALT 93*  --   --   --   --   ALKPHOS 84  --   --   --   --   BILITOT 0.6  --   --   --   --   GFRNONAA 53*   < > 40* 40* 40*  ANIONGAP 10   < > 6 5 10    < > = values in this interval not displayed.    Lipids  Recent Labs  Lab 08/27/21 0628  CHOL 114  TRIG 78  HDL 45  LDLCALC 53  CHOLHDL 2.5    Hematology Recent Labs  Lab 08/30/21 0502 08/31/21 0535 09/01/21 0423  WBC 5.1 5.3 5.1  RBC 3.79* 3.96* 3.86*  HGB 11.3* 11.9* 11.1*  HCT 34.5* 36.3* 35.3*  MCV 91.0 91.7 91.5  MCH 29.8 30.1 28.8  MCHC 32.8 32.8 31.4  RDW 14.0 13.8 14.1  PLT 168 182 173   Thyroid No results for input(s): TSH, FREET4 in the last 168 hours.  BNP Recent Labs  Lab 08/26/21 0750  BNP 1,040.2*    DDimer No results for input(s): DDIMER in the last 168 hours.   Radiology    CARDIAC CATHETERIZATION  Result Date: 08/31/2021 1.   Severe underlying three-vessel coronary artery disease with patent grafts including LIMA to LAD, SVG to large diagonal, sequential SVG to OM /distal left circumflex and SVG to right PDA. 2.  Right heart catheterization showed normal right and left-sided filling pressures, minimal pulmonary hypertension and normal cardiac output. Recommendations: Continue medical therapy for coronary artery disease and congestive heart failure. Volume status appears optimal at this time.    Cardiac Studies   Cardiac cath 08/31/21 RIGHT/LEFT HEART CATH AND CORONARY ANGIOGRAPHY   Conclusion  1.  Severe underlying three-vessel coronary artery disease with patent grafts including LIMA to LAD, SVG to large diagonal, sequential SVG to OM /distal left circumflex and SVG to right PDA. 2.  Right heart catheterization showed normal right and left-sided filling pressures, minimal pulmonary hypertension and normal cardiac output.   Recommendations: Continue medical therapy for coronary artery disease and congestive heart failure. Volume status appears optimal at this time.  Coronary Diagrams  Diagnostic Dominance: Right Intervention    Echo 08/27/21  1. Left ventricular ejection fraction, by estimation, is 35 to 40%. The  left ventricle has moderately decreased function. The left ventricle  demonstrates global hypokinesis. Concern for anterior, anteroseptal and  apical hypokinesis. The left ventricular   internal cavity size was mildly dilated. Left ventricular diastolic  parameters are indeterminate.   2. Right ventricular systolic function is grossly moderately reduced  though difficult to visualize. The right ventricular size is mildly  enlarged.   3. Left atrial size was mildly dilated.   4. The mitral valve is normal in structure. No evidence of mitral valve  regurgitation. No evidence of mitral stenosis.   5. The aortic valve is normal in structure. Aortic valve regurgitation is  not visualized. No  aortic stenosis is present.   6. The inferior vena cava is dilated in size with <50% respiratory  variability, suggesting right atrial pressure of 15 mmHg.   7. Bradycardia noted   8. Challenging images  Patient Profile     86 y.o. male with a history of VF arrest, CAD status post CABG x5 in October 2021, ischemic cardiomyopathy, heart failure with improved ejection fraction, hypertension, hyperlipidemia, diabetes, obesity, and stage III chronic kidney disease, who presetned 10/19 w/ progressive dyspnea, orthopnea, and volume overload.  Assessment & Plan    Acute on chronic diastolic and systolic CHF: Patient is essentially back to baseline from a symptom standpoint.  Catheterization yesterday showed normal right and left heart filling pressures.  All bypass grafts are widely patent. -Continue current regimen of hydralazine and isosorbide mononitrate. -Defer adding beta-blocker in the setting of second-degree AV block. -Consider addition of ACE inhibitor/ARB if renal function remains stable at discharge.  We will defer adding today in the setting of chronic kidney disease and contrast exposure yesterday as well as history of hyperkalemia with ARB's in the past. -Restart home diuretic regimen of furosemide 80 mg p.o. daily.  Importance of medication compliance stressed as patient had missed at least a few doses when he was out of town prior to discharge.  CAD s/p CABG x5 with stable angina: No angina reported.  Yesterday's catheterization showed severe native CAD but patency of all bypass grafts. -Continue ongoing clopidogrel therapy.  We will stop aspirin at this time. -Aggressive secondary prevention including high intensity statin therapy. -Continue isosorbide mononitrate for antianginal and heart failure therapy. -Defer restarting amlodipine.  CKD stage 3: Renal function stable. -Restart furosemide 80 mg p.o. daily (prior home dose of diuretic). -Consider adding ACE inhibitor at  follow-up if renal function is stable, though hyperkalemia previously noted with ARB and aldosterone antagonist.  DM2 -Per internal medicine.  Juncitonal bradycardia secondary AV block type 1: Intermittent Mobitz type I second-degree AV block and 2:1 AV block noted on telemetry overnight.  Patient is asymptomatic. -Plan for 14-day event monitor at discharge.  [facility] will sign off.   Medication Recommendations: See above. Other recommendations (labs, testing, etc): 14-day event monitor to be placed prior to discharge today. Follow up as an outpatient: 2 weeks with APP   For questions or updates, please contact [place] Please consult [email or web address] for contact info under [facility] cardiology.     Signed, Nelva Bush, MD  09/01/2021, 1:23 PM

## 2021-09-01 NOTE — Discharge Summary (Signed)
Physician Discharge Summary  [name] OIN:[id number] DOB: [birth year] DOA: 08/26/2021  PCP: Tomasita Morrow, MD  Admit date: 08/26/2021 Discharge date: 09/01/2021  Admitted From: home  Disposition:  home w/ home health   Recommendations for Outpatient Follow-up:  Follow up with PCP in 1-2 weeks F/u w/ cardio, Dr. Fletcher Anon, in 1-2 weeks   Home Health: yes Equipment/Devices:  Discharge Condition: stable  CODE STATUS: full  Diet recommendation: Heart Healthy / Carb Modified  Brief/Interim Summary: HPI was taken from Dr. Blaine Hamper: [name] is a 86 y.o. male with medical history significant of dCHF, HTN, HLD, DM, GERD, CKD stage IIIa, CAD, CABG, cardiac arrest, who presents with shortness of breath.   Patient states that he has shortness breath for several days, which has been progressively worsening, particularly on exertion.  It is worse when he is laying flat.  He also has worsening leg edema bilaterally.  He has cough with clear mucus production.  No chest pain, fever or chills.  Denies nausea vomiting, diarrhea or abdominal pain.  No symptoms of UTI. He was seen by his primary cardiologist recently and did have his Lasix dose increased, but his symptoms has not improved significantly.   ED Course: pt was found to have BNP 1040, troponin level 22, negative COVID PCR, procalcitonin < 0.10, renal function close to baseline, temperature normal, blood pressure 177/85, heart rate 58, oxygen saturation 90% on room air, which improved to 99% on 2 L oxygen, RR 20.  Chest x-ray showed bilateral basilar opacity with possible small pleural effusion.  Patient is admitted to progressive bed as inpatient  Hospital course from Dr. Jimmye Norman 10/20-10/25/22: Pt presented w/ shortness of breath which was secondary to acute on chronic combined CHF. Pt was treated w/ IV lasix, imdur, hydralazine but unable to take BB or CCB secondary to bradycardia. Of note, pt is s/p cardiac cath which showed patent  grafts, normal filling pressures and minimal pulmonary HTN. Pt will was d/c home w/ zio patch as per cardio. Pt will f/u outpatient w/ cardio and likely EP. For more information, please see previous progress/consult notes.   Discharge Diagnoses:  Principal Problem:   Acute on chronic diastolic CHF (congestive heart failure) (HCC) Active Problems:   HTN (hypertension)   Stage 3a chronic kidney disease (HCC)   Unstable angina (HCC)   CAD (coronary artery disease)   HLD (hyperlipidemia)   GERD (gastroesophageal reflux disease)   Type II diabetes mellitus with renal manifestations (HCC)   Acute on chronic systolic congestive heart failure (HCC)   Junctional bradycardia  Acute on chronic combined CHF: repeat echo shows EF 35-40%, LV global hypokinesis, diastolic function was indeterminate.  Has leg edema, positive JVD, elevated BNP 1040, clinically consistent with CHF exacerbation. Continue to hold lasix secondary to AKI on CKD. Monitor I/Os and daily weights. Cardiac cath today    Bradycardia: w/ Mobitz I block as per cardio. Asymptomatic but drops into 30s. Continue on to hold CCB. Will likely EP as an outpatient. Continue on tele. Cardio recs apprec   HTN: continue on imdur, hydralazine as per cardio. IV hydralazine prn   AKI on CKDIIIa: baseline Cr 1.62. Cr is stable from day prior    CAD: w/ elevated troponin. S/p CABG. S/p cardiac cath which showed patent grafts, normal filling pressures and minimal pulmonary HTN.   HLD: continue on statin   GERD: continue on PPI    DM2: HbA1c 7.8, poorly controlled. Continue on levemir, SSI w/ accuchecks  Thrombocytopenia: resolved  Morbid obesity: BMI 34.2. Complicates overall care & prognosis   Discharge Instructions  Discharge Instructions     Diet - low sodium heart healthy   Complete by: As directed    Diet Carb Modified   Complete by: As directed    Discharge instructions   Complete by: As directed    F/u w/ cardio, Dr. Fletcher Anon,  in 2 weeks. F/u w/ PCP in 1-2 weeks   Increase activity slowly   Complete by: As directed       Allergies as of 09/01/2021       Reactions   Angiotensin Receptor Blockers    hyperkalemia   Metformin Diarrhea   Spironolactone    Hyperkalemia        Medication List     STOP taking these medications    amLODipine 10 MG tablet Commonly known as: NORVASC   aspirin EC 81 MG tablet       TAKE these medications    acetaminophen 500 MG tablet Commonly known as: TYLENOL Take 500-1,000 mg by mouth every 6 (six) hours as needed for mild pain or moderate pain.   acyclovir 800 MG tablet Commonly known as: ZOVIRAX Take 800 mg by mouth daily.   clopidogrel 75 MG tablet Commonly known as: PLAVIX TAKE 1 TABLET BY MOUTH EVERY DAY   fluticasone 44 MCG/ACT inhaler Commonly known as: FLOVENT HFA Inhale 2 puffs into the lungs 2 (two) times daily.   furosemide 80 MG tablet Commonly known as: LASIX Take 1 tablet (80 mg total) by mouth daily.   hydrALAZINE 25 MG tablet Commonly known as: APRESOLINE Take 1 tablet (25 mg total) by mouth 3 (three) times daily.   insulin detemir 100 UNIT/ML injection Commonly known as: LEVEMIR Inject 0.2 mLs (20 Units total) into the skin 2 (two) times daily.   isosorbide mononitrate 30 MG 24 hr tablet Commonly known as: IMDUR Take 1 tablet (30 mg total) by mouth daily. Start taking on: September 02, 2021   magnesium oxide 400 MG tablet Commonly known as: MAG-OX TAKE 1 TABLET BY MOUTH EVERY DAY   multivitamin with minerals Tabs tablet Take 1 tablet by mouth daily.   oxybutynin 5 MG tablet Commonly known as: DITROPAN Take 5 mg by mouth 2 (two) times daily.   pantoprazole 40 MG tablet Commonly known as: PROTONIX Take 40 mg by mouth 2 (two) times daily.   rosuvastatin 40 MG tablet Commonly known as: CRESTOR Take 1 tablet (40 mg total) by mouth daily.   traMADol 50 MG tablet Commonly known as: ULTRAM Take 1 tablet (50 mg total) by  mouth every 12 (twelve) hours as needed for severe pain. Further refill request will need to be sent to primary care.        Allergies  Allergen Reactions   Angiotensin Receptor Blockers     hyperkalemia   Metformin Diarrhea   Spironolactone     Hyperkalemia     Consultations: Cardio    Procedures/Studies: DG Chest 2 View  Result Date: 08/26/2021 CLINICAL DATA:  Pt complains of increased SOB since last pm. per EMS wheezing noted at home EXAM: CHEST - 2 VIEW COMPARISON:  11/20/2020 FINDINGS: New moderate airspace opacities in both lower lobes. Heart size normal.  CABG markers.  Left atrial appendage clip. Blunting of posterior costophrenic angles suggesting small effusions. No pneumothorax. Sternotomy wires. Partially calcified right scapular/chest wall mass again noted. IMPRESSION: 1. New bibasilar airspace infiltrates with possible small effusions. Electronically Signed   By: Keturah Barre  Vernard Gambles M.D.   On: 08/26/2021 08:23   CARDIAC CATHETERIZATION  Result Date: 08/31/2021 1.  Severe underlying three-vessel coronary artery disease with patent grafts including LIMA to LAD, SVG to large diagonal, sequential SVG to OM /distal left circumflex and SVG to right PDA. 2.  Right heart catheterization showed normal right and left-sided filling pressures, minimal pulmonary hypertension and normal cardiac output. Recommendations: Continue medical therapy for coronary artery disease and congestive heart failure. Volume status appears optimal at this time.   ECHOCARDIOGRAM COMPLETE  Result Date: 08/27/2021    ECHOCARDIOGRAM REPORT   Patient Name:   [name] Date of Exam: 08/27/2021 Medical Rec #:  [id number]    Height:       68.0 in Accession #:    [id number]   Weight:       232.0 lb Date of Birth:  [birth year]   BSA:          2.177 m Patient Age:    89 years     BP:           124/85 mmHg Patient Gender: M            HR:           49 bpm. Exam Location:  [facility] Procedure: 2D Echo, Color Doppler, Cardiac  Doppler and Intracardiac            Opacification Agent Indications:     I50.31 congestive heart failure-Acute Diastolic  History:         Patient has prior history of Echocardiogram examinations, most                  recent 11/27/2020. CAD, Prior CABG, CKD,                  Signs/Symptoms:Shortness of Breath; Risk Factors:Hypertension,                  Diabetes and Dyslipidemia.  Sonographer:     Charmayne Sheer Referring Phys:  Annetta South Diagnosing Phys: Ida Rogue MD  Sonographer Comments: Technically difficult study due to poor echo windows. IMPRESSIONS  1. Left ventricular ejection fraction, by estimation, is 35 to 40%. The left ventricle has moderately decreased function. The left ventricle demonstrates global hypokinesis. Concern for anterior, anteroseptal and apical hypokinesis. The left ventricular  internal cavity size was mildly dilated. Left ventricular diastolic parameters are indeterminate.  2. Right ventricular systolic function is grossly moderately reduced though difficult to visualize. The right ventricular size is mildly enlarged.  3. Left atrial size was mildly dilated.  4. The mitral valve is normal in structure. No evidence of mitral valve regurgitation. No evidence of mitral stenosis.  5. The aortic valve is normal in structure. Aortic valve regurgitation is not visualized. No aortic stenosis is present.  6. The inferior vena cava is dilated in size with <50% respiratory variability, suggesting right atrial pressure of 15 mmHg.  7. Bradycardia noted  8. Challenging images FINDINGS  Left Ventricle: Left ventricular ejection fraction, by estimation, is 35 to 40%. The left ventricle has moderately decreased function. The left ventricle demonstrates global hypokinesis. The left ventricular internal cavity size was mildly dilated. There is no left ventricular hypertrophy. Left ventricular diastolic parameters are indeterminate. Right Ventricle: The right ventricular size is mildly enlarged. No  increase in right ventricular wall thickness. Right ventricular systolic function is moderately reduced. Left Atrium: Left atrial size was mildly dilated. Right Atrium: Right atrial size was normal in size. Pericardium:  There is no evidence of pericardial effusion. Mitral Valve: The mitral valve is normal in structure. No evidence of mitral valve regurgitation. No evidence of mitral valve stenosis. MV peak gradient, 3.0 mmHg. The mean mitral valve gradient is 1.0 mmHg. Tricuspid Valve: The tricuspid valve is normal in structure. Tricuspid valve regurgitation is mild . No evidence of tricuspid stenosis. Aortic Valve: The aortic valve is normal in structure. Aortic valve regurgitation is not visualized. No aortic stenosis is present. Aortic valve mean gradient measures 4.0 mmHg. Aortic valve peak gradient measures 7.3 mmHg. Aortic valve area, by VTI measures 2.38 cm. Pulmonic Valve: The pulmonic valve was normal in structure. Pulmonic valve regurgitation is not visualized. No evidence of pulmonic stenosis. Aorta: The aortic root is normal in size and structure. Venous: The inferior vena cava is dilated in size with less than 50% respiratory variability, suggesting right atrial pressure of 15 mmHg. IAS/Shunts: No atrial level shunt detected by color flow Doppler.  LEFT VENTRICLE PLAX 2D LVIDd:         5.48 cm      Diastology LVIDs:         4.27 cm      LV e' medial:    7.62 cm/s LV PW:         1.16 cm      LV E/e' medial:  11.0 LV IVS:        1.02 cm      LV e' lateral:   6.64 cm/s LVOT diam:     2.10 cm      LV E/e' lateral: 12.7 LV SV:         73 LV SV Index:   34 LVOT Area:     3.46 cm  LV Volumes (MOD) LV vol d, MOD A2C: 131.0 ml LV vol d, MOD A4C: 123.0 ml LV vol s, MOD A2C: 69.8 ml LV vol s, MOD A4C: 58.0 ml LV SV MOD A2C:     61.2 ml LV SV MOD A4C:     123.0 ml LV SV MOD BP:      63.4 ml RIGHT VENTRICLE RV Basal diam:  4.62 cm LEFT ATRIUM             Index        RIGHT ATRIUM           Index LA diam:         4.30 cm 1.98 cm/m   RA Area:     17.00 cm LA Vol (A2C):   62.4 ml 28.67 ml/m  RA Volume:   50.60 ml  23.24 ml/m LA Vol (A4C):   32.6 ml 14.98 ml/m LA Biplane Vol: 50.0 ml 22.97 ml/m  AORTIC VALVE                    PULMONIC VALVE AV Area (Vmax):    2.48 cm     PV Vmax:       0.96 m/s AV Area (Vmean):   2.20 cm     PV Vmean:      61.400 cm/s AV Area (VTI):     2.38 cm     PV VTI:        0.203 m AV Vmax:           135.00 cm/s  PV Peak grad:  3.7 mmHg AV Vmean:          89.800 cm/s  PV Mean grad:  2.0 mmHg AV VTI:  0.307 m AV Peak Grad:      7.3 mmHg AV Mean Grad:      4.0 mmHg LVOT Vmax:         96.60 cm/s LVOT Vmean:        57.000 cm/s LVOT VTI:          0.211 m LVOT/AV VTI ratio: 0.69  AORTA Ao Root diam: 3.40 cm MITRAL VALVE               TRICUSPID VALVE MV Area (PHT): 2.16 cm    TR Peak grad:   20.2 mmHg MV Area VTI:   2.30 cm    TR Vmax:        225.00 cm/s MV Peak grad:  3.0 mmHg MV Mean grad:  1.0 mmHg    SHUNTS MV Vmax:       0.86 m/s    Systemic VTI:  0.21 m MV Vmean:      55.3 cm/s   Systemic Diam: 2.10 cm MV Decel Time: 352 msec MV E velocity: 84.20 cm/s MV A velocity: 29.80 cm/s MV E/A ratio:  2.83 Ida Rogue MD Electronically signed by Ida Rogue MD Signature Date/Time: 08/27/2021/9:13:15 PM    Final    (Echo, Carotid, EGD, Colonoscopy, ERCP)    Subjective: Pt denies any complaints    Discharge Exam: Vitals:   09/01/21 0733 09/01/21 1046  BP:  (!) 166/90  Pulse: (!) 50 (!) 56  Resp: 16 17  Temp:  98 F (36.7 C)  SpO2: 97% 98%   Vitals:   09/01/21 0503 09/01/21 0728 09/01/21 0733 09/01/21 1046  BP: 139/66 (!) 149/72  (!) 166/90  Pulse: (!) 45 (!) 49 (!) 50 (!) 56  Resp: 18 16 16 17   Temp: 98.4 F (36.9 C) (!) 97.3 F (36.3 C)  98 F (36.7 C)  TempSrc: Oral     SpO2: 98% 98% 97% 98%  Weight: 103.8 kg     Height:        General: Pt is alert, awake, not in acute distress Cardiovascular: S1/S2 +, no rubs, no gallops Respiratory: diminished breath  sounds b/l otherwise clear  Abdominal: Soft, NT, obese, bowel sounds + Extremities: no cyanosis    The results of significant diagnostics from this hospitalization (including imaging, microbiology, ancillary and laboratory) are listed below for reference.     Microbiology: Recent Results (from the past 240 hour(s))  Resp Panel by RT-PCR (Flu A&B, Covid) Nasopharyngeal Swab     Status: None   Collection Time: 08/26/21  8:21 AM   Specimen: Nasopharyngeal Swab; Nasopharyngeal(NP) swabs in vial transport medium  Result Value Ref Range Status   SARS Coronavirus 2 by RT PCR NEGATIVE NEGATIVE Final    Comment: (NOTE) SARS-CoV-2 target nucleic acids are NOT DETECTED.  The SARS-CoV-2 RNA is generally detectable in upper respiratory specimens during the acute phase of infection. The lowest concentration of SARS-CoV-2 viral copies this assay can detect is 138 copies/mL. A negative result does not preclude SARS-Cov-2 infection and should not be used as the sole basis for treatment or other patient management decisions. A negative result may occur with  improper specimen collection/handling, submission of specimen other than nasopharyngeal swab, presence of viral mutation(s) within the areas targeted by this assay, and inadequate number of viral copies(<138 copies/mL). A negative result must be combined with clinical observations, patient history, and epidemiological information. The expected result is Negative.  Fact Sheet for Patients:  EntrepreneurPulse.com.au  Fact Sheet for Healthcare Providers:  IncredibleEmployment.be  This test is no t yet approved or cleared by the [place] and  has been authorized for detection and/or diagnosis of SARS-CoV-2 by FDA under an Emergency Use Authorization (EUA). This EUA will remain  in effect (meaning this test can be used) for the duration of the COVID-19 declaration under Section 564(b)(1) of the Act,  21 U.S.C.section 360bbb-3(b)(1), unless the authorization is terminated  or revoked sooner.       Influenza A by PCR NEGATIVE NEGATIVE Final   Influenza B by PCR NEGATIVE NEGATIVE Final    Comment: (NOTE) The Xpert Xpress SARS-CoV-2/FLU/RSV plus assay is intended as an aid in the diagnosis of influenza from Nasopharyngeal swab specimens and should not be used as a sole basis for treatment. Nasal washings and aspirates are unacceptable for Xpert Xpress SARS-CoV-2/FLU/RSV testing.  Fact Sheet for Patients: EntrepreneurPulse.com.au  Fact Sheet for Healthcare Providers: IncredibleEmployment.be  This test is not yet approved or cleared by the [place] FDA and has been authorized for detection and/or diagnosis of SARS-CoV-2 by FDA under an Emergency Use Authorization (EUA). This EUA will remain in effect (meaning this test can be used) for the duration of the COVID-19 declaration under Section 564(b)(1) of the Act, 21 U.S.C. section 360bbb-3(b)(1), unless the authorization is terminated or revoked.  Performed at [facility], [address] [place],  [address]      Labs: BNP (last 3 results) Recent Labs    08/26/21 0750  BNP 8,144.8*   Basic Metabolic Panel: Recent Labs  Lab 08/28/21 0600 08/29/21 0603 08/30/21 0502 08/31/21 0535 09/01/21 0423  NA 141 140 141 140 140  K 3.4* 3.5 3.4* 3.6 3.8  CL 109 104 109 108 107  CO2 27 27 26 27 23   GLUCOSE 70 111* 147* 88 185*  BUN 25* 27* 31* 32* 28*  CREATININE 1.64* 1.77* 1.69* 1.69* 1.66*  CALCIUM 8.1* 8.1* 7.9* 8.3* 8.2*  MG 1.9 1.9 2.0 2.3 2.1   Liver Function Tests: Recent Labs  Lab 08/26/21 0750  AST 56*  ALT 93*  ALKPHOS 84  BILITOT 0.6  PROT 6.7  ALBUMIN 3.8   No results for input(s): LIPASE, AMYLASE in the last 168 hours. No results for input(s): AMMONIA in the last 168 hours. CBC: Recent Labs  Lab 08/28/21 0600 08/29/21 0603 08/30/21 0502  08/31/21 0535 09/01/21 0423  WBC 6.2 5.0 5.1 5.3 5.1  HGB 11.8* 11.4* 11.3* 11.9* 11.1*  HCT 37.0* 35.2* 34.5* 36.3* 35.3*  MCV 90.7 91.0 91.0 91.7 91.5  PLT 198 171 168 182 173   Cardiac Enzymes: No results for input(s): CKTOTAL, CKMB, CKMBINDEX, TROPONINI in the last 168 hours. BNP: Invalid input(s): POCBNP CBG: Recent Labs  Lab 08/31/21 1145 08/31/21 1556 08/31/21 2120 09/01/21 0727 09/01/21 1045  GLUCAP 75 160* 124* 119* 149*   D-Dimer No results for input(s): DDIMER in the last 72 hours. Hgb A1c No results for input(s): HGBA1C in the last 72 hours. Lipid Profile No results for input(s): CHOL, HDL, LDLCALC, TRIG, CHOLHDL, LDLDIRECT in the last 72 hours. Thyroid function studies No results for input(s): TSH, T4TOTAL, T3FREE, THYROIDAB in the last 72 hours.  Invalid input(s): FREET3 Anemia work up No results for input(s): VITAMINB12, FOLATE, FERRITIN, TIBC, IRON, RETICCTPCT in the last 72 hours. Urinalysis    Component Value Date/Time   COLORURINE YELLOW 09/01/2020 2035   APPEARANCEUR HAZY (A) 09/01/2020 2035   LABSPEC 1.010 09/01/2020 2035   PHURINE 5.0 09/01/2020 2035   GLUCOSEU  NEGATIVE 09/01/2020 2035   HGBUR MODERATE (A) 09/01/2020 2035   BILIRUBINUR NEGATIVE 09/01/2020 2035   KETONESUR NEGATIVE 09/01/2020 2035   PROTEINUR NEGATIVE 09/01/2020 2035   NITRITE NEGATIVE 09/01/2020 2035   LEUKOCYTESUR TRACE (A) 09/01/2020 2035   Sepsis Labs Invalid input(s): PROCALCITONIN,  WBC,  LACTICIDVEN Microbiology Recent Results (from the past 240 hour(s))  Resp Panel by RT-PCR (Flu A&B, Covid) Nasopharyngeal Swab     Status: None   Collection Time: 08/26/21  8:21 AM   Specimen: Nasopharyngeal Swab; Nasopharyngeal(NP) swabs in vial transport medium  Result Value Ref Range Status   SARS Coronavirus 2 by RT PCR NEGATIVE NEGATIVE Final    Comment: (NOTE) SARS-CoV-2 target nucleic acids are NOT DETECTED.  The SARS-CoV-2 RNA is generally detectable in upper  respiratory specimens during the acute phase of infection. The lowest concentration of SARS-CoV-2 viral copies this assay can detect is 138 copies/mL. A negative result does not preclude SARS-Cov-2 infection and should not be used as the sole basis for treatment or other patient management decisions. A negative result may occur with  improper specimen collection/handling, submission of specimen other than nasopharyngeal swab, presence of viral mutation(s) within the areas targeted by this assay, and inadequate number of viral copies(<138 copies/mL). A negative result must be combined with clinical observations, patient history, and epidemiological information. The expected result is Negative.  Fact Sheet for Patients:  EntrepreneurPulse.com.au  Fact Sheet for Healthcare Providers:  IncredibleEmployment.be  This test is no t yet approved or cleared by the [place] FDA and  has been authorized for detection and/or diagnosis of SARS-CoV-2 by FDA under an Emergency Use Authorization (EUA). This EUA will remain  in effect (meaning this test can be used) for the duration of the COVID-19 declaration under Section 564(b)(1) of the Act, 21 U.S.C.section 360bbb-3(b)(1), unless the authorization is terminated  or revoked sooner.       Influenza A by PCR NEGATIVE NEGATIVE Final   Influenza B by PCR NEGATIVE NEGATIVE Final    Comment: (NOTE) The Xpert Xpress SARS-CoV-2/FLU/RSV plus assay is intended as an aid in the diagnosis of influenza from Nasopharyngeal swab specimens and should not be used as a sole basis for treatment. Nasal washings and aspirates are unacceptable for Xpert Xpress SARS-CoV-2/FLU/RSV testing.  Fact Sheet for Patients: EntrepreneurPulse.com.au  Fact Sheet for Healthcare Providers: IncredibleEmployment.be  This test is not yet approved or cleared by the [place] FDA and has been  authorized for detection and/or diagnosis of SARS-CoV-2 by FDA under an Emergency Use Authorization (EUA). This EUA will remain in effect (meaning this test can be used) for the duration of the COVID-19 declaration under Section 564(b)(1) of the Act, 21 U.S.C. section 360bbb-3(b)(1), unless the authorization is terminated or revoked.  Performed at [facility], [address], [place], [place] [address]      Time coordinating discharge: Over 30 minutes  SIGNED:   Wyvonnia Dusky, MD  [facility] 09/01/2021, 3:47 PM Pager   If 7PM-7AM, please contact night-coverage

## 2021-09-01 NOTE — Telephone Encounter (Signed)
Pt currently admitted.  Will need TCM follow up visit with Dr. Fletcher Anon or APP in 1-2 weeks.  Will forward to scheduling and triage to contact pt after discharge.

## 2021-09-02 NOTE — Telephone Encounter (Signed)
Patient contacted regarding discharge from [facility] on Tuesday 09/01/21.  Patient understands to follow up with provider Ignacia Bayley, NP on 09/15/21 at 10:55 at the [facility] office. Patient understands discharge instructions? Yes Patient understands medications and regiment? Yes Patient understands to bring all medications to this visit? Yes  The patient advised he had not yet picked up his isosorbide, but will be picking this up from the pharmacy today. I have discussed possible side effects of this medication with the patient regarding a headache. I have asked him to call back with any further questions/ concerns in the interim. The patient voices understanding and was appreciative for the call.   Ask patient:  Are you enrolled in My Chart: NO

## 2021-09-03 ENCOUNTER — Ambulatory Visit: Payer: Medicaid Other | Admitting: Family

## 2021-09-04 ENCOUNTER — Telehealth: Payer: Self-pay | Admitting: Physician Assistant

## 2021-09-04 ENCOUNTER — Telehealth: Payer: Self-pay | Admitting: Cardiovascular Disease

## 2021-09-04 NOTE — Telephone Encounter (Signed)
   Cardiac Monitor Alert  Date of alert:  09/04/2021   Patient Name: [name]  DOB: 04-24-1935  MRN: [id number]   [facility] Cardiologist: [name], MD  [facility] EP:  Vickie Epley, MD    Monitor Information: Long Term Monitor-Live Telemetry [ZioAT]  Reason:  Junctional bradycardia Ordering provider:  Tarri Glenn, [place]   Alert Complete Heart Block at [phone] 10/28 with HR 41-42 lasted 9 seconds.  Pt is now back in sinus rhythm with second degree Mobitz 1. This is the 1st alert for this rhythm.   Next Cardiology Appointment   Date:  09/15/21  Provider:  [id number]  The patient was contacted today.  He is asymptomatic. Arrhythmia, symptoms and history reviewed with Ignacia Bayley, NP. Plan:  Schedule pt with EP asap.   Darlyne Russian, RN  09/04/2021 12:36 PM

## 2021-09-04 NOTE — Telephone Encounter (Signed)
[facility] calling with an abnormal EKG result

## 2021-09-04 NOTE — Telephone Encounter (Signed)
Recommend outpatient EP consultation after monitor is finished.

## 2021-09-04 NOTE — Telephone Encounter (Signed)
Received call from [employer]:  Pt had CHB at 0342 EST that lasted 60.6 seconds with HR 45-56 bpm. Pt now back in sinus rhythm with second degree mobitz 1.   Pt was called, but no answer. I also attempted to call the patient and was able to speak with him. He confirmed he was sleeping, asymptomatic at the time of CHB.

## 2021-09-06 ENCOUNTER — Telehealth: Payer: Self-pay | Admitting: Internal Medicine

## 2021-09-06 ENCOUNTER — Telehealth: Payer: Self-pay | Admitting: Medical

## 2021-09-06 NOTE — Telephone Encounter (Signed)
Irhytym called stating patient had 2- 40 second runs of complete heart block. Patient was asymptomatic during these times. Told patient to come to the ED for closer monitoring and potential pacemaker placement on Monday AM as these rhythms can be potentially dangerous. Patient understood and agreed.

## 2021-09-06 NOTE — Telephone Encounter (Signed)
   Cardiac Monitor Alert  Date of alert:  09/06/2021   Patient Name: [name]  DOB: 12-28-1934  MRN: [id number]   [facility] Cardiologist: [name], MD  [facility] EP:  Vickie Epley, MD    Monitor Information: Long Term Monitor-Live Telemetry [ZioAT]  Reason:  CHB Ordering provider:  Dr. Fletcher Anon  Alert Complete Heart Block - Autotrigger at 5:10am and 5:10am - 7 seconds of CHB with HR in the 30s and 7.3 seconds CHB with HR in the 30s respectively. Asleep and asymptomatic  This is the (at least) 4th alert for this rhythm.   Next Cardiology Appointment Date:  09/12/21 at 10:55am  Provider:  Ignacia Bayley, NP  The patient was contacted today.  He is asymptomatic. Arrhythmia, symptoms and history reviewed with Dr. Lovena Le.  He is not on any AV nodal blocking agents and all previous episodes have been nocturnal and asymptomatic.   Plan:  Patient tells me he was instructed to present to the hospital for PPM consideration, however given episodes are only occurring at night and he remains asymptomatic, feel he can hold off on an ER visit with plans for close outpatient follow-up. We reviewed ED precautions should symptoms change. Will route to schedulers to arrange urgent EP follow-up with Dr. Quentin Ore.   Abigail Butts, PA-C  09/06/2021 7:25 AM

## 2021-09-07 NOTE — Telephone Encounter (Signed)
Pt has been scheduled to see Dr. Quentin Ore 09/09/21.

## 2021-09-08 ENCOUNTER — Ambulatory Visit: Payer: Medicaid Other | Admitting: Family

## 2021-09-08 NOTE — Progress Notes (Signed)
Electrophysiology Office Note:    Date:  09/09/2021   ID:  [name], DOB 1934/12/23, MRN [id number]  PCP:  Tomasita Morrow, MD  [facility] Cardiologist:  [name], MD  [facility] Electrophysiologist:  Vickie Epley, MD   Referring MD: Tomasita Morrow, *   Chief Complaint: CHB  History of Present Illness:    [name] is a 86 y.o. male who presents for an evaluation of CHB at the request of Roby Lofts, PA-C. Their medical history includes HFpEF, HTN, HLD, DM, GERD, CKDIIIa, CAD s/p CABG, cardiac arrest. He was recently admitted in October for shortness of breath with exertion. He was found to have HR in the 30s. He was symptomatic with shortness of breath at the time.  He denied presyncope or syncope.  He has since been given a heart monitor which showed multiple episodes of complete heart block mostly at night.  Review of EKGs from the day of admission to the hospital showed 2-1 AV block and what looks like high degree AV block.  Ventricular rates would dip into the 30s during his Wenckebach sequences.  Today presents for follow-up and to discuss possible pacemaker implant.      Past Medical History:  Diagnosis Date   CAD (coronary artery disease)    a. 08/2019 VF arrest-->sev 3vd on cath-->CABGx5 (LIMA->LAD, VG->OM->LCX, VG->RPDA, VG->Diag).   CKD (chronic kidney disease), stage III (HCC)    Diabetes mellitus without complication ([place])    HFimpEF (heart failure with improved ejection fraction) ([place])    a. 08/2020 Echo: EF 40-45%; b. 11/2020 Echo: EF 55%, no rwma, mild LVH, Gr2 DD. nl RV fxn.   Hyperlipidemia LDL goal <70    Hypertension    Ischemic cardiomyopathy    a. 08/2020 Echo: EF 40-45%; b. 11/2020 Echo: EF 55%.    Past Surgical History:  Procedure Laterality Date   BACK SURGERY     CARDIAC CATHETERIZATION     CLIPPING OF ATRIAL APPENDAGE N/A 08/29/2020   Procedure: CLIPPING OF ATRIAL APPENDAGE USING ATRICURE 66 MM ATRICLIP  FLEX-V;  Surgeon: Grace Isaac, MD;  Location: [place];  Service: Open Heart Surgery;  Laterality: N/A;   CORONARY ARTERY BYPASS GRAFT N/A 08/29/2020   Procedure: CORONARY ARTERY BYPASS GRAFTING (CABG) X 5 USING LEFT INTERNAL MAMMARY ARTERY AND ENDOSCOPICALLY HARVESTED RIGHT GREATER SAPHENOUS VEIN. LIMA TO LAD, SVG TO OM SEQ TO CIRC, SVG TO PD, SVG TO DIAG.;  Surgeon: Grace Isaac, MD;  Location: [place];  Service: Open Heart Surgery;  Laterality: N/A;   ENDOVEIN HARVEST OF GREATER SAPHENOUS VEIN Right 08/29/2020   Procedure: ENDOVEIN HARVEST OF GREATER SAPHENOUS VEIN;  Surgeon: Grace Isaac, MD;  Location: [place];  Service: Open Heart Surgery;  Laterality: Right;   HERNIA REPAIR     LEFT HEART CATH AND CORONARY ANGIOGRAPHY N/A 08/25/2020   Procedure: LEFT HEART CATH AND CORONARY ANGIOGRAPHY;  Surgeon: [name], MD;  Location: [place] [facility];  Service: Cardiovascular;  Laterality: N/A;   RIGHT/LEFT HEART CATH AND CORONARY ANGIOGRAPHY N/A 08/31/2021   Procedure: RIGHT/LEFT HEART CATH AND CORONARY ANGIOGRAPHY;  Surgeon: [name], MD;  Location: [place] [facility];  Service: Cardiovascular;  Laterality: N/A;   TEE WITHOUT CARDIOVERSION N/A 08/29/2020   Procedure: TRANSESOPHAGEAL ECHOCARDIOGRAM (TEE);  Surgeon: Grace Isaac, MD;  Location: [place];  Service: Open Heart Surgery;  Laterality: N/A;    Current Medications: Current Meds  Medication Sig   acetaminophen (TYLENOL)  500 MG tablet Take 500-1,000 mg by mouth every 6 (six) hours as needed for mild pain or moderate pain.    acyclovir (ZOVIRAX) 800 MG tablet Take 800 mg by mouth daily.   clopidogrel (PLAVIX) 75 MG tablet TAKE 1 TABLET BY MOUTH EVERY DAY   fluticasone (FLOVENT HFA) 44 MCG/ACT inhaler Inhale 2 puffs into the lungs 2 (two) times daily.   furosemide (LASIX) 80 MG tablet Take 1 tablet (80 mg total) by mouth daily.   hydrALAZINE (APRESOLINE) 25 MG tablet Take 1 tablet (25 mg total) by mouth 3  (three) times daily.   insulin detemir (LEVEMIR) 100 UNIT/ML injection Inject 0.2 mLs (20 Units total) into the skin 2 (two) times daily.   isosorbide mononitrate (IMDUR) 30 MG 24 hr tablet Take 1 tablet (30 mg total) by mouth daily.   losartan (COZAAR) 50 MG tablet Take 50 mg by mouth daily.   magnesium oxide (MAG-OX) 400 MG tablet TAKE 1 TABLET BY MOUTH EVERY DAY   Multiple Vitamin (MULTIVITAMIN WITH MINERALS) TABS tablet Take 1 tablet by mouth daily.   oxybutynin (DITROPAN) 5 MG tablet Take 5 mg by mouth 2 (two) times daily.   pantoprazole (PROTONIX) 40 MG tablet Take 40 mg by mouth 2 (two) times daily.   rosuvastatin (CRESTOR) 40 MG tablet Take 1 tablet (40 mg total) by mouth daily.   traMADol (ULTRAM) 50 MG tablet Take 1 tablet (50 mg total) by mouth every 12 (twelve) hours as needed for severe pain. Further refill request will need to be sent to primary care.     Allergies:   Angiotensin receptor blockers, Metformin, and Spironolactone   Social History   Socioeconomic History   Marital status: Married    Spouse name: Not on file   Number of children: Not on file   Years of education: Not on file   Highest education level: Not on file  Occupational History   Not on file  Tobacco Use   Smoking status: Former    Types: Cigarettes    Quit date: 07/26/1996    Years since quitting: 25.1   Smokeless tobacco: Never  Vaping Use   Vaping Use: Never used  Substance and Sexual Activity   Alcohol use: Not Currently   Drug use: Never   Sexual activity: Yes  Other Topics Concern   Not on file  Social History Narrative   Not on file   Social Determinants of Health   Financial Resource Strain: Not on file  Food Insecurity: Not on file  Transportation Needs: Not on file  Physical Activity: Not on file  Stress: Not on file  Social Connections: Not on file     Family History: The patient's family history includes Heart attack in his mother.  ROS:   Please see the history of  present illness.    All other systems reviewed and are negative.  EKGs/Labs/Other Studies Reviewed:    The following studies were reviewed today:  08/27/2021 Echo LV function moderate reduced, 35% RV function reduced LA mildly dilated No MR  08/27/2021 ECGs reviewed - show sinus rhythm with blocked PACs, V rates upper 30s. Marked ST/T wave abnormality with prolonged QT.  08/26/2021 ECG - sinus rhythm. RBBB, blocked PACs. Leftward axis. First degree av delay.  08/31/2021 LHC 1.  Severe underlying three-vessel coronary artery disease with patent grafts including LIMA to LAD, SVG to large diagonal, sequential SVG to OM /distal left circumflex and SVG to right PDA. 2.  Right heart catheterization showed  normal right and left-sided filling pressures, minimal pulmonary hypertension and normal cardiac output.   EKG:  The ekg ordered today demonstrates sinus rhythm.  Wenckebach.  PVC.   Recent Labs: 08/26/2021: ALT 93; B Natriuretic Peptide 1,040.2 09/01/2021: BUN 28; Creatinine, Ser 1.66; Hemoglobin 11.1; Magnesium 2.1; Platelets 173; Potassium 3.8; Sodium 140  Recent Lipid Panel    Component Value Date/Time   CHOL 114 08/27/2021 0628   CHOL 133 05/22/2013 0225   TRIG 78 08/27/2021 0628   TRIG 68 05/22/2013 0225   HDL 45 08/27/2021 0628   HDL 50 05/22/2013 0225   CHOLHDL 2.5 08/27/2021 0628   VLDL 16 08/27/2021 0628   VLDL 14 05/22/2013 0225   LDLCALC 53 08/27/2021 0628   LDLCALC 69 05/22/2013 0225    Physical Exam:    VS:  BP (!) 160/90 (BP Location: Left Arm, Patient Position: Sitting, Cuff Size: Normal)   Pulse (!) 59   Ht 5\' 8"  (1.727 m)   Wt 238 lb (108 kg)   SpO2 96%   BMI 36.19 kg/m     Wt Readings from Last 3 Encounters:  09/09/21 238 lb (108 kg)  09/01/21 228 lb 13.4 oz (103.8 kg)  08/07/21 232 lb (105.2 kg)     GEN:  Well nourished, well developed in no acute distress HEENT: Normal NECK: No JVD; No carotid bruits LYMPHATICS: No  lymphadenopathy CARDIAC: RRR, no murmurs, rubs, gallops RESPIRATORY:  Clear to auscultation without rales, wheezing or rhonchi  ABDOMEN: Soft, non-tender, non-distended MUSCULOSKELETAL:  No edema; No deformity  SKIN: Warm and dry NEUROLOGIC:  Alert and oriented x 3 PSYCHIATRIC:  Normal affect       ASSESSMENT:    1. Chronic combined systolic and diastolic heart failure ([place])   2. Coronary artery disease of native artery of native heart with stable angina pectoris ([place])   3. Hx of CABG   4. Trifascicular block    PLAN:    In order of problems listed above:  #Chronic mild systolic congestive heart failure secondary to ischemic cardiomyopathy post CABG No ischemic symptoms today.  Slightly volume overloaded I will have him increase his Lasix to twice daily for the next 2 days.  He will then resume once a day dosing.  Continue losartan, Imdur, hydralazine. CRT as below.  #Trifascicular block, transient complete heart block and high degree AV block during recent hospital admission At this point, I am concerned that he is having symptomatic bradycardia associate with his trifascicular block in Wenckebach sequences with bradycardia. I believe a pacemaker is indicated and I suspect he will have a high pacing burden.  In light of his reduced ejection fraction, favor CRT-P implant.  I discussed the procedure in detail with the patient including the risks and recovery and he wishes to proceed.  We will hold his Plavix for 3 days prior to the procedure.  Risks, benefits, alternatives to PPM implantation were discussed in detail with the patient today. The patient understands that the risks include but are not limited to bleeding, infection, pneumothorax, perforation, tamponade, vascular damage, renal failure, MI, stroke, death, and lead dislodgement and wishes to proceed.  We will therefore schedule device implantation at the next available time.  Plan for [facility] CRT-P.        Total  time spent with patient today 45 minutes. This includes reviewing records, evaluating the patient and coordinating care.  Medication Adjustments/Labs and Tests Ordered: Current medicines are reviewed at length with the patient today.  Concerns  regarding medicines are outlined above.  Orders Placed This Encounter  Procedures   EKG 12-Lead   No orders of the defined types were placed in this encounter.    Signed, Hilton Cork. Quentin Ore, MD, [facility], [facility] 09/09/2021 10:06 AM    Electrophysiology [facility]

## 2021-09-09 ENCOUNTER — Ambulatory Visit: Payer: Medicaid Other | Admitting: Internal Medicine

## 2021-09-09 ENCOUNTER — Other Ambulatory Visit: Payer: Self-pay

## 2021-09-09 ENCOUNTER — Encounter: Payer: Self-pay | Admitting: Cardiology

## 2021-09-09 ENCOUNTER — Ambulatory Visit (INDEPENDENT_AMBULATORY_CARE_PROVIDER_SITE_OTHER): Payer: Medicare HMO | Admitting: Cardiology

## 2021-09-09 VITALS — BP 160/90 | HR 59 | Ht 68.0 in | Wt 238.0 lb

## 2021-09-09 DIAGNOSIS — I453 Trifascicular block: Secondary | ICD-10-CM | POA: Diagnosis not present

## 2021-09-09 DIAGNOSIS — I25118 Atherosclerotic heart disease of native coronary artery with other forms of angina pectoris: Secondary | ICD-10-CM

## 2021-09-09 DIAGNOSIS — Z951 Presence of aortocoronary bypass graft: Secondary | ICD-10-CM

## 2021-09-09 DIAGNOSIS — I5042 Chronic combined systolic (congestive) and diastolic (congestive) heart failure: Secondary | ICD-10-CM

## 2021-09-09 NOTE — Patient Instructions (Addendum)
Medication Instructions:  Your physician has recommended you make the following change in your medication:    INCREASE YOUR LASIX 80 mg---Take one tablet by mouth TWICE a day for the NEXT 2 days.  Then return to once a day.   Lab Work: None ordered. If you have labs (blood work) drawn today and your tests are completely normal, you will receive your results only by: [place] (if you have MyChart) OR A paper copy in the mail If you have any lab test that is abnormal or we need to change your treatment, we will call you to review the results.  Testing/Procedures: None ordered.  Follow-Up: At [facility], you and your health needs are our priority.  As part of our continuing mission to provide you with exceptional heart care, we have created designated Provider Care Teams.  These Care Teams include your primary Cardiologist (physician) and Advanced Practice Providers (APPs -  Physician Assistants and Nurse Practitioners) who all work together to provide you with the care you need, when you need it.  Your next appointment:    SEE INSTRUCTION LETTER  Biventricular Pacemaker Implantation A biventricular pacemaker implantation is a procedure to place (implant) a pacemaker near the heart. A pacemaker is a small, battery-powered device that helps control the heartbeat. If the heart beats irregularly or too slowly (bradycardia), the pacemaker will pace the heart so that it beats at a normal rate or a programmed rate. The parts of a biventricular pacemaker include: The pulse generator. The pulse generator contains a small computer and a memory system that is programmed to keep the heart beating at a certain rate. The pulse generator also produces the electrical signal that triggers the heart to beat. This is implanted under the skin of the upper chest, near the collarbone. Wires (leads). There may be two or three leads placed in the heart--one to the right atrium, one to the right ventricle,  and one through the coronary sinus to reach the left ventricle of the heart. The leads are connected to the pulse generator. They transmit electrical pulses from the pulse generator to the heart. A biventricular pacemaker is used in people with heart failure due to weak heart muscles. The pacemaker can help the heart chambers pump more efficiently. This procedure may be done to treat: Symptoms of severe heart failure, such as shortness of breath (dyspnea). Loss of consciousness that happens repeatedly (syncope) because of an irregular heart rate. Tell a health care provider about: Any allergies you have. All medicines you are taking, including vitamins, herbs, eye drops, creams, and over-the-counter medicines. Any problems you or family members have had with anesthetic medicines. Any blood disorders you have. Any surgeries you have had. Any medical conditions you have. Whether you are pregnant or may be pregnant. What are the risks? Generally, this is a safe procedure. However, problems may occur, including: Infection. Swelling, bruising, or bleeding at the pacemaker site, especially if you take blood thinners. Allergic reactions to medicines or dyes. Damage to blood vessels or nerves near the pacemaker. Failure of the pacemaker to improve your condition. Collapsed lung. Blood clots. Lead failures. This may require more surgery. What happens before the procedure? Medicines Ask your health care provider about: Changing or stopping your regular medicines. This is especially important if you are taking diabetes medicines or blood thinners. Taking medicines such as aspirin and ibuprofen. These medicines can thin your blood. Do not take these medicines unless your health care provider tells you to  take them. Taking over-the-counter medicines, vitamins, herbs, and supplements. Eating and drinking Follow instructions from your health care provider about eating and drinking, which may  include: 8 hours before the procedure - stop eating heavy meals or foods, such as meat, fried foods, or fatty foods. 6 hours before the procedure - stop eating light meals or foods, such as toast or cereal. 6 hours before the procedure - stop drinking milk or drinks that contain milk. 2 hours before the procedure - stop drinking clear liquids. Staying hydrated Follow instructions from your health care provider about hydration, which may include: Up to 2 hours before the procedure - you may continue to drink clear liquids, such as water, clear fruit juice, black coffee, and plain tea.  General instructions Do not use any products that contain nicotine or tobacco for at least 4 weeks before the procedure. These products include cigarettes, e-cigarettes, and chewing tobacco. If you need help quitting, ask your health care provider. You may have tests, including: Blood tests. Chest X-rays. Echocardiogram. This is a test that uses sound waves (ultrasound) to produce an image of the heart. Ask your health care provider: How your surgery site will be marked. What steps will be taken to help prevent infection. These may include: Removing hair at the surgery site. Washing skin with a germ-killing soap. Taking antibiotic medicine. Plan to have someone take you home from the hospital or clinic. If you will be going home right after the procedure, plan to have someone with you for 24 hours. What happens during the procedure?  An IV will be inserted into one of your veins. You will be connected to a heart monitor. Large electrode pads will be placed on the front and back of your chest. You will be given one or more of the following: A medicine to help you relax (sedative). A medicine to make you fall asleep (general anesthetic). A medicine that is injected into an area of your body to numb the area (local anesthetic). An incision will be made in your upper chest, near your heart. The leads will be  guided into your incision, through your blood vessels, and into your heart. Your health care provider will use an X-ray machine (fluoroscope) to guide the leads into your heart. The leads will be attached to your heart muscles and to the pulse generator. The leads will be tested to make sure that they work correctly. The pulse generator will be implanted under your skin, near your incision. The heart monitor will be watched to ensure that the pacer is working correctly. Your incision will be closed with stitches (sutures), skin glue, or adhesive tape. A bandage (dressing) will be placed over your incision. The procedure may vary among health care providers and hospitals. What happens after the procedure? Your blood pressure, heart rate, breathing rate, and blood oxygen level will be monitored until you leave the hospital or clinic. You may continue to receive fluids and medicines through an IV. You will be given pain medicine as needed. You will have a chest X-ray done. This is to make sure that your pacemaker is in the right place. You will be given a pacemaker identification card. This card lists the implant date, device model, and manufacturer of your pacemaker. Do not drive for 24 hours if you were given a sedative during your procedure. Summary A pacemaker is a small, battery-powered device that helps control the heartbeat. A biventricular pacemaker is used in people with heart failure due to  weak heart muscles. Follow instructions from your health care provider about taking medicines and about eating and drinking before the procedure. You will be given a pacemaker identification card that lists the implant date, device model, and manufacturer of your pacemaker. This information is not intended to replace advice given to you by your health care provider. Make sure you discuss any questions you have with your health care provider. Document Revised: 09/27/2018 Document Reviewed:  09/27/2018 Elsevier Patient Education  2022 [employer].

## 2021-09-09 NOTE — Telephone Encounter (Signed)
Reviewed the patient's chart. He was seen in office today with Dr. Quentin Ore.  Scheduled for PPM implant on 10/12/21.

## 2021-09-13 ENCOUNTER — Emergency Department: Payer: Medicare HMO

## 2021-09-13 ENCOUNTER — Other Ambulatory Visit: Payer: Self-pay

## 2021-09-13 ENCOUNTER — Emergency Department
Admission: EM | Admit: 2021-09-13 | Discharge: 2021-09-13 | Disposition: A | Discharge status: Home / Self Care | Payer: Medicare HMO | Attending: Student in an Organized Health Care Education/Training Program | Admitting: Student in an Organized Health Care Education/Training Program

## 2021-09-13 DIAGNOSIS — Z7902 Long term (current) use of antithrombotics/antiplatelets: Secondary | ICD-10-CM | POA: Insufficient documentation

## 2021-09-13 DIAGNOSIS — N1831 Chronic kidney disease, stage 3a: Secondary | ICD-10-CM | POA: Diagnosis not present

## 2021-09-13 DIAGNOSIS — Z20822 Contact with and (suspected) exposure to covid-19: Secondary | ICD-10-CM | POA: Insufficient documentation

## 2021-09-13 DIAGNOSIS — E1122 Type 2 diabetes mellitus with diabetic chronic kidney disease: Secondary | ICD-10-CM | POA: Insufficient documentation

## 2021-09-13 DIAGNOSIS — I509 Heart failure, unspecified: Secondary | ICD-10-CM

## 2021-09-13 DIAGNOSIS — Z8546 Personal history of malignant neoplasm of prostate: Secondary | ICD-10-CM | POA: Insufficient documentation

## 2021-09-13 DIAGNOSIS — I5033 Acute on chronic diastolic (congestive) heart failure: Secondary | ICD-10-CM | POA: Diagnosis not present

## 2021-09-13 DIAGNOSIS — Z87891 Personal history of nicotine dependence: Secondary | ICD-10-CM | POA: Insufficient documentation

## 2021-09-13 DIAGNOSIS — Z7951 Long term (current) use of inhaled steroids: Secondary | ICD-10-CM | POA: Insufficient documentation

## 2021-09-13 DIAGNOSIS — I130 Hypertensive heart and chronic kidney disease with heart failure and stage 1 through stage 4 chronic kidney disease, or unspecified chronic kidney disease: Secondary | ICD-10-CM | POA: Insufficient documentation

## 2021-09-13 DIAGNOSIS — I2510 Atherosclerotic heart disease of native coronary artery without angina pectoris: Secondary | ICD-10-CM | POA: Insufficient documentation

## 2021-09-13 DIAGNOSIS — Z79899 Other long term (current) drug therapy: Secondary | ICD-10-CM | POA: Insufficient documentation

## 2021-09-13 DIAGNOSIS — Z794 Long term (current) use of insulin: Secondary | ICD-10-CM | POA: Diagnosis not present

## 2021-09-13 DIAGNOSIS — R0789 Other chest pain: Secondary | ICD-10-CM | POA: Diagnosis present

## 2021-09-13 DIAGNOSIS — Z955 Presence of coronary angioplasty implant and graft: Secondary | ICD-10-CM | POA: Insufficient documentation

## 2021-09-13 LAB — RESP PANEL BY RT-PCR (FLU A&B, COVID) ARPGX2
Influenza A by PCR: NEGATIVE
Influenza B by PCR: NEGATIVE
SARS Coronavirus 2 by RT PCR: NEGATIVE

## 2021-09-13 LAB — CBC
HCT: 39.1 % (ref 39.0–52.0)
Hemoglobin: 12.3 g/dL — ABNORMAL LOW (ref 13.0–17.0)
MCH: 28.7 pg (ref 26.0–34.0)
MCHC: 31.5 g/dL (ref 30.0–36.0)
MCV: 91.1 fL (ref 80.0–100.0)
Platelets: 205 10*3/uL (ref 150–400)
RBC: 4.29 MIL/uL (ref 4.22–5.81)
RDW: 14 % (ref 11.5–15.5)
WBC: 6 10*3/uL (ref 4.0–10.5)
nRBC: 0.3 % — ABNORMAL HIGH (ref 0.0–0.2)

## 2021-09-13 LAB — BASIC METABOLIC PANEL
Anion gap: 10 (ref 5–15)
BUN: 29 mg/dL — ABNORMAL HIGH (ref 8–23)
CO2: 25 mmol/L (ref 22–32)
Calcium: 8.4 mg/dL — ABNORMAL LOW (ref 8.9–10.3)
Chloride: 108 mmol/L (ref 98–111)
Creatinine, Ser: 1.84 mg/dL — ABNORMAL HIGH (ref 0.61–1.24)
GFR, Estimated: 36 mL/min — ABNORMAL LOW (ref >60)
Glucose, Bld: 69 mg/dL — ABNORMAL LOW (ref 70–99)
Potassium: 3.4 mmol/L — ABNORMAL LOW (ref 3.5–5.1)
Sodium: 143 mmol/L (ref 135–145)

## 2021-09-13 LAB — BRAIN NATRIURETIC PEPTIDE: B Natriuretic Peptide: 945.4 pg/mL — ABNORMAL HIGH (ref 0.0–100.0)

## 2021-09-13 LAB — PROCALCITONIN: Procalcitonin: <0.1 ng/mL

## 2021-09-13 LAB — TROPONIN I (HIGH SENSITIVITY)
Troponin I (High Sensitivity): 21 ng/L — ABNORMAL HIGH (ref <18)
Troponin I (High Sensitivity): 22 ng/L — ABNORMAL HIGH (ref <18)

## 2021-09-13 MED ORDER — FUROSEMIDE 10 MG/ML IJ SOLN
40.0000 mg | Freq: Once | INTRAMUSCULAR | Status: AC
  Administered 2021-09-13: 40 mg via INTRAVENOUS
  Filled 2021-09-13: qty 4

## 2021-09-13 MED ORDER — POTASSIUM CHLORIDE CRYS ER 20 MEQ PO TBCR
40.0000 meq | EXTENDED_RELEASE_TABLET | Freq: Once | ORAL | Status: AC
  Administered 2021-09-13: 40 meq via ORAL
  Filled 2021-09-13: qty 2

## 2021-09-13 NOTE — ED Provider Notes (Signed)
[facility] Emergency Department Provider Note   ____________________________________________   I have reviewed the triage vital signs and the nursing notes.   HISTORY  Chief Complaint Chest Pain   History limited by: Not Limited   HPI [name] is a 86 y.o. male who presents to the emergency department today because of concerns for chest pain.  Patient states that it started this afternoon.  Is located in the middle of his chest.  He describes it as burning.  It initially felt like his heart rate was going low at that time.  He states he does have a history of heart disease and is awaiting pacemaker placement next month.  The patient states the time my exam that he is feeling better.  He denies any recent fevers.  Denies any shortness of breath.  Denies any recent nausea or vomiting.    Records reviewed. Per medical record review patient has a history of CAD, HLD, HTN. Bradycardia.   Past Medical History:  Diagnosis Date   CAD (coronary artery disease)    a. 08/2019 VF arrest-->sev 3vd on cath-->CABGx5 (LIMA->LAD, VG->OM->LCX, VG->RPDA, VG->Diag).   CKD (chronic kidney disease), stage III (HCC)    Diabetes mellitus without complication ([place])    HFimpEF (heart failure with improved ejection fraction) ([place])    a. 08/2020 Echo: EF 40-45%; b. 11/2020 Echo: EF 55%, no rwma, mild LVH, Gr2 DD. nl RV fxn.   Hyperlipidemia LDL goal <70    Hypertension    Ischemic cardiomyopathy    a. 08/2020 Echo: EF 40-45%; b. 11/2020 Echo: EF 55%.    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   CAD, multiple vessel  08/25/2020   Non-ST elevation (NSTEMI) myocardial infarction ([place]) 08/21/2020   History of cardiac arrest 08/21/2020   HFrEF (heart failure with reduced ejection fraction) ([place])    History of sustained ventricular fibrillation 08/18/2020   Acute respiratory failure with hypoxia (HCC)    Chronic diastolic heart failure (HCC)    Stage 3a chronic kidney disease ([place]) 11/20/2019   UTI (urinary tract infection) 07/26/2019   HTN (hypertension) 07/26/2019   Diabetes ([place]) 07/26/2019   Unstable angina ([place]) 12/03/2008   Status post percutaneous transluminal coronary angioplasty 10/08/2008   Malignant neoplasm of prostate ([place]) 10/05/2005   Essential hypertension 08/24/1989    Past Surgical History:  Procedure Laterality Date   BACK SURGERY     CARDIAC CATHETERIZATION     CLIPPING OF ATRIAL APPENDAGE N/A 08/29/2020   Procedure: CLIPPING OF ATRIAL APPENDAGE USING ATRICURE 57 MM ATRICLIP FLEX-V;  Surgeon: Grace Isaac, MD;  Location: [place];  Service: Open Heart Surgery;  Laterality: N/A;   CORONARY ARTERY BYPASS GRAFT N/A 08/29/2020   Procedure: CORONARY ARTERY BYPASS GRAFTING (CABG) X 5 USING LEFT INTERNAL MAMMARY ARTERY AND ENDOSCOPICALLY HARVESTED RIGHT GREATER SAPHENOUS VEIN. LIMA TO LAD, SVG TO OM SEQ TO CIRC, SVG TO PD, SVG TO DIAG.;  Surgeon: Grace Isaac, MD;  Location: [place];  Service: Open Heart Surgery;  Laterality: N/A;   ENDOVEIN HARVEST OF GREATER SAPHENOUS VEIN Right 08/29/2020   Procedure:  ENDOVEIN HARVEST OF GREATER SAPHENOUS VEIN;  Surgeon: Grace Isaac, MD;  Location: [place];  Service: Open Heart Surgery;  Laterality: Right;   HERNIA REPAIR     LEFT HEART CATH AND CORONARY ANGIOGRAPHY N/A 08/25/2020   Procedure: LEFT HEART CATH AND CORONARY ANGIOGRAPHY;  Surgeon: [name], MD;  Location: [place] [facility];  Service: Cardiovascular;  Laterality: N/A;   RIGHT/LEFT HEART CATH AND CORONARY ANGIOGRAPHY N/A 08/31/2021   Procedure: RIGHT/LEFT HEART CATH  AND CORONARY ANGIOGRAPHY;  Surgeon: [name], MD;  Location: [place] [facility];  Service: Cardiovascular;  Laterality: N/A;   TEE WITHOUT CARDIOVERSION N/A 08/29/2020   Procedure: TRANSESOPHAGEAL ECHOCARDIOGRAM (TEE);  Surgeon: Grace Isaac, MD;  Location: [place];  Service: Open Heart Surgery;  Laterality: N/A;    Prior to Admission medications   Medication Sig Start Date End Date Taking? Authorizing Provider  acetaminophen (TYLENOL) 500 MG tablet Take 500-1,000 mg by mouth every 6 (six) hours as needed for mild pain or moderate pain.    Yes [provider]  acyclovir (ZOVIRAX) 800 MG tablet Take 800 mg by mouth daily. 12/02/20  Yes [provider]  clopidogrel (PLAVIX) 75 MG tablet TAKE 1 TABLET BY MOUTH EVERY DAY 05/17/21  Yes Loel Dubonnet, NP  fluticasone (FLOVENT HFA) 44 MCG/ACT inhaler Inhale 2 puffs into the lungs 2 (two) times daily.   Yes [provider]  furosemide (LASIX) 80 MG tablet Take 1 tablet (80 mg total) by mouth daily. 08/07/21  Yes Theora Gianotti, NP  hydrALAZINE (APRESOLINE) 25 MG tablet Take 1 tablet (25 mg total) by mouth 3 (three) times daily. 08/07/21 11/05/21 Yes Theora Gianotti, NP  isosorbide mononitrate (IMDUR) 30 MG 24 hr tablet Take 1 tablet (30 mg total) by mouth daily. 09/02/21 10/02/21 Yes Wyvonnia Dusky, MD  losartan (COZAAR) 50 MG tablet Take 50 mg by mouth daily. 03/23/21  Yes [provider]  magnesium oxide (MAG-OX) 400 MG tablet TAKE 1 TABLET BY MOUTH EVERY DAY 06/01/21  Yes [name], MD  Multiple Vitamin (MULTIVITAMIN WITH MINERALS) TABS tablet Take 1 tablet by mouth daily. 09/11/20  Yes Gold, Wayne E, PA-C  oxybutynin (DITROPAN) 5 MG tablet Take 5 mg by mouth 2 (two) times daily.   Yes [provider]  pantoprazole (PROTONIX) 40 MG tablet Take 40 mg by mouth 2 (two) times daily.   Yes [provider]  rosuvastatin (CRESTOR) 40 MG tablet Take 1 tablet (40  mg total) by mouth daily. 06/17/21  Yes [name], MD  traMADol (ULTRAM) 50 MG tablet Take 1 tablet (50 mg total) by mouth every 12 (twelve) hours as needed for severe pain. Further refill request will need to be sent to primary care. 02/27/21  Yes [name], MD  insulin detemir (LEVEMIR) 100 UNIT/ML injection Inject 0.2 mLs (20 Units total) into the skin 2 (two) times daily. Patient taking differently: Inject 20 Units into the skin daily. 09/10/20   John Giovanni, PA-C    Allergies Angiotensin receptor blockers, Metformin, and Spironolactone  Family History  Problem Relation Age of Onset   Heart attack Mother     Social History Social History   Tobacco Use   Smoking status: Former    Types: Cigarettes    Quit date: 07/26/1996    Years since quitting: 25.1   Smokeless tobacco: Never  Vaping Use   Vaping Use: Never used  Substance Use Topics   Alcohol  use: Not Currently   Drug use: Never    Review of Systems Constitutional: No fever/chills Eyes: No visual changes. ENT: No sore throat. Cardiovascular: Positive for chest pain. Respiratory: Denies shortness of breath. Gastrointestinal: No abdominal pain.  No nausea, no vomiting.  No diarrhea.   Genitourinary: Negative for dysuria. Musculoskeletal: Negative for back pain. Skin: Negative for rash. Neurological: Negative for headaches, focal weakness or numbness.  ____________________________________________   PHYSICAL EXAM:  VITAL SIGNS: ED Triage Vitals  Enc Vitals Group     BP 09/13/21 1653 (!) 169/76     Pulse Rate 09/13/21 1653 (!) 57     Resp 09/13/21 1653 20     Temp 09/13/21 1653 98.7 F (37.1 C)     Temp Source 09/13/21 1653 Oral     SpO2 09/13/21 1653 98 %     Weight 09/13/21 1654 230 lb (104.3 kg)     Height 09/13/21 1654 5\' 8"  (1.727 m)   Constitutional: Alert and oriented.  Eyes: Conjunctivae are normal.  ENT      Head: Normocephalic and atraumatic.      Nose: No  congestion/rhinnorhea.      Mouth/Throat: Mucous membranes are moist.      Neck: No stridor. Hematological/Lymphatic/Immunilogical: No cervical lymphadenopathy. Cardiovascular: Normal rate, regular rhythm.  No murmurs, rubs, or gallops.  Respiratory: Normal respiratory effort without tachypnea nor retractions. Breath sounds are clear and equal bilaterally. No wheezes/rales/rhonchi. Gastrointestinal: Soft and non tender. No rebound. No guarding.  Genitourinary: Deferred Musculoskeletal: Normal range of motion in all extremities. Bilateral lower extremity edema.  Neurologic:  Normal speech and language. No gross focal neurologic deficits are appreciated.  Skin:  Skin is warm, dry and intact. No rash noted. Psychiatric: Mood and affect are normal. Speech and behavior are normal. Patient exhibits appropriate insight and judgment.  ____________________________________________    LABS (pertinent positives/negatives)  BMP na 143, k 3.4, glu 69, cr 1.84 Trop hs 21 to 22 BNP 945.4 CBC wbc 6.0, hgb 12.3, plt 205  ____________________________________________   EKG  I, Nance Pear, attending physician, personally viewed and interpreted this EKG  EKG Time: 1658 Rate: 59 Rhythm: sinus bradycardia with 1st degree av block Axis: left axis deviation Intervals: qtc 495 QRS: RBBB ST changes: no st elevation Impression: abnormal ekg  ____________________________________________    RADIOLOGY  CXR Streaky opacities in bases, concerning for atypical infection vs edema.    ____________________________________________   PROCEDURES  Procedures  ____________________________________________   INITIAL IMPRESSION / ASSESSMENT AND PLAN / ED COURSE  Pertinent labs & imaging results that were available during my care of the patient were reviewed by me and considered in my medical decision making (see chart for details).   Patient presented to the emergency department today because of  concerns for chest pain.  At the time my exam the patient stated he did feel better.  Patient had minimally elevated troponin x2 without any significant change.  He did have some edema on chest x-ray as well as lower extremity swelling.  At this time I do think he is somewhat fluid overloaded.  Any recent possibility of pneumonia on the chest x-ray however patient is afebrile with no white count.  This time I do think pneumonia is less likely.  I did discuss this with the patient.  Patient again stated he felt better and requested discharge from the hospital.  At this time I think it is reasonable.  Discussed return precautions.   ____________________________________________   FINAL CLINICAL IMPRESSION(S) /  ED DIAGNOSES  Final diagnoses:  Acute on chronic congestive heart failure, unspecified heart failure type [facility])     Note: This dictation was prepared with Dragon dictation. Any transcriptional errors that result from this process are unintentional     Nance Pear, MD 09/13/21 2107

## 2021-09-13 NOTE — ED Triage Notes (Signed)
Pt via EMS from home. Pt c/o mid sternal, non-radiating CP that started 2:30pm.  Pt has a significant cardiac hx including multiple heart attacks. Pt is waiting for a pacemaker/defibrillator. Pt has not taken his home medications. 3 Nitro, no pain at time. Pt took 324 of ASA before EMS got there. Pt is A&OX4 and NAD.

## 2021-09-13 NOTE — ED Provider Notes (Signed)
Emergency Medicine Provider Triage Evaluation Note  [name] , a 86 y.o. male  was evaluated in triage. Chest pain started around 2:30 today. Pain did not radiate, no diaphoresis or vomiting. Pt arrives via EMS who gave 3 NTG. Pain now 0/10. History of 8 MI.  Review of Systems  Positive: Chest pain Negative: Radiating pain, nausea, vomiting, diaphoresis  Physical Exam  There were no vitals taken for this visit. Gen:   Awake, no distress   Resp:  Normal effort  MSK:   Moves extremities without difficulty  Other:    Medical Decision Making  Medically screening exam initiated at 4:51 PM.  Appropriate orders placed.  [name] was informed that the remainder of the evaluation will be completed by another provider, this initial triage assessment does not replace that evaluation, and the importance of remaining in the ED until their evaluation is complete.   Victorino Dike, FNP 09/13/21 1656    Duane Lot, MD 09/13/21 1906

## 2021-09-13 NOTE — Discharge Instructions (Signed)
Please seek medical attention for any high fevers, chest pain, shortness of breath, change in behavior, persistent vomiting, bloody stool or any other new or concerning symptoms.  

## 2021-09-15 ENCOUNTER — Ambulatory Visit (INDEPENDENT_AMBULATORY_CARE_PROVIDER_SITE_OTHER): Payer: Medicare HMO | Admitting: Nurse Practitioner

## 2021-09-15 ENCOUNTER — Other Ambulatory Visit: Payer: Self-pay

## 2021-09-15 ENCOUNTER — Encounter: Payer: Self-pay | Admitting: Nurse Practitioner

## 2021-09-15 VITALS — BP 120/70 | HR 63 | Ht 68.0 in | Wt 235.0 lb

## 2021-09-15 DIAGNOSIS — I10 Essential (primary) hypertension: Secondary | ICD-10-CM

## 2021-09-15 DIAGNOSIS — I5020 Unspecified systolic (congestive) heart failure: Secondary | ICD-10-CM | POA: Diagnosis not present

## 2021-09-15 DIAGNOSIS — I2510 Atherosclerotic heart disease of native coronary artery without angina pectoris: Secondary | ICD-10-CM

## 2021-09-15 DIAGNOSIS — I255 Ischemic cardiomyopathy: Secondary | ICD-10-CM

## 2021-09-15 DIAGNOSIS — E1122 Type 2 diabetes mellitus with diabetic chronic kidney disease: Secondary | ICD-10-CM

## 2021-09-15 DIAGNOSIS — E785 Hyperlipidemia, unspecified: Secondary | ICD-10-CM

## 2021-09-15 DIAGNOSIS — I779 Disorder of arteries and arterioles, unspecified: Secondary | ICD-10-CM

## 2021-09-15 DIAGNOSIS — I453 Trifascicular block: Secondary | ICD-10-CM

## 2021-09-15 DIAGNOSIS — N1832 Chronic kidney disease, stage 3b: Secondary | ICD-10-CM

## 2021-09-15 DIAGNOSIS — Z794 Long term (current) use of insulin: Secondary | ICD-10-CM

## 2021-09-15 DIAGNOSIS — N183 Chronic kidney disease, stage 3 unspecified: Secondary | ICD-10-CM

## 2021-09-15 NOTE — Patient Instructions (Signed)
Medication Instructions:  Your physician recommends that you continue on your current medications as directed. Please refer to the Current Medication list given to you today.  *If you need a refill on your cardiac medications before your next appointment, please call your pharmacy*   Lab Work: None ordered  If you have labs (blood work) drawn today and your tests are completely normal, you will receive your results only by: [place] (if you have MyChart) OR A paper copy in the mail If you have any lab test that is abnormal or we need to change your treatment, we will call you to review the results.   Testing/Procedures: Your physician has requested that you have a carotid duplex. This test is an ultrasound of the carotid arteries in your neck. It looks at blood flow through these arteries that supply the brain with blood. Allow one hour for this exam. There are no restrictions or special instructions.    Follow-Up: At [facility], you and your health needs are our priority.  As part of our continuing mission to provide you with exceptional heart care, we have created designated Provider Care Teams.  These Care Teams include your primary Cardiologist (physician) and Advanced Practice Providers (APPs -  Physician Assistants and Nurse Practitioners) who all work together to provide you with the care you need, when you need it.  We recommend signing up for the patient portal called "MyChart".  Sign up information is provided on this After Visit Summary.  MyChart is used to connect with patients for Virtual Visits (Telemedicine).  Patients are able to view lab/test results, encounter notes, upcoming appointments, etc.  Non-urgent messages can be sent to your provider as well.   To learn more about what you can do with MyChart, go to [email or web address].    Your next appointment:   Last week of November  The format for your next appointment:   In Person  Provider:    Murray Hodgkins, [place]    Other Instructions N/A
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
## 2021-09-16 NOTE — Progress Notes (Signed)
Patient ID: [name], male    DOB: 1935/05/17, 86 y.o.   MRN: [id number]  HPI  [name] is a 86 y/o male with a history of heart block, CAD, DM, hyperlipidemia, HTN, CKD, previous tobacco use and chronic heart failure.   Echo report from 08/27/21 reviewed and showed an EF of 35-40% along with mild LAE.   RHC/LHC done 08/31/21 & showed: Severe underlying three-vessel coronary artery disease with patent grafts including LIMA to LAD, SVG to large diagonal, sequential SVG to OM /distal left circumflex and SVG to right PDA. 2.  Right heart catheterization showed normal right and left-sided filling pressures, minimal pulmonary hypertension and normal cardiac output.  Was in the ED 09/13/21 due to acute on chronic HF where he was evaluated and released.   He presents today for his initial visit with a chief complaint of minimal fatigue upon moderate exertion. He describes this as chronic in nature having been present for several years. He has associated shortness of breath & leg swelling along with this. He denies any difficulty sleeping, dizziness, abdominal distention, palpitations, chest pain, cough or weight gain.   Scheduled for pacemaker implantation early December.   Weighing daily. Wife doesn't cook with salt and he doesn't add any to his food.   Brought his medication bottles with him but he doesn't have losartan with him. He's not sure if he has it at home and just forgot that bottle or if he needs a new RX.   Past Medical History:  Diagnosis Date   AV block, Mobitz 1    CAD (coronary artery disease)    a. 08/2019 VF arrest-->sev 3vd on cath-->CABGx5 (LIMA->LAD, VG->OM->LCX, VG->RPDA, VG->Diag); b. 08/2021 Cath: sev native multivessel dzs w/ 5/5 patent grafts. Nl filling pressures->Med rx.   CHF (congestive heart failure) (HCC)    CKD (chronic kidney disease), stage III (HCC)    Diabetes mellitus without complication ([employer])    HFrEF (heart failure with reduced ejection fraction)  ([place])    a. 08/2020 Echo: EF 40-45%; b. 11/2020 Echo: EF 55%, no rwma, mild LVH, Gr2 DD. nl RV fxn; c. 08/2021 Echo: EF 35-40%, glob HK w/ ant/antsept/apical HK. Reduced RV fxn. Mildly dil LA.   Hyperlipidemia LDL goal <70    Hypertension    Intermittent Complete heart block ([place])    a. 08/2021 noted on Zio.   Ischemic cardiomyopathy    a. 08/2020 Echo: EF 40-45%; b. 11/2020 Echo: EF 55%; c. 08/2021 Echo: EF 35-40%.   Trifascicular block    Past Surgical History:  Procedure Laterality Date   BACK SURGERY     CARDIAC CATHETERIZATION     CLIPPING OF ATRIAL APPENDAGE N/A 08/29/2020   Procedure: CLIPPING OF ATRIAL APPENDAGE USING ATRICURE 45 MM ATRICLIP FLEX-V;  Surgeon: Grace Isaac, MD;  Location: [facility];  Service: Open Heart Surgery;  Laterality: N/A;   CORONARY ARTERY BYPASS GRAFT N/A 08/29/2020   Procedure: CORONARY ARTERY BYPASS GRAFTING (CABG) X 5 USING LEFT INTERNAL MAMMARY ARTERY AND ENDOSCOPICALLY HARVESTED RIGHT GREATER SAPHENOUS VEIN. LIMA TO LAD, SVG TO OM SEQ TO CIRC, SVG TO PD, SVG TO DIAG.;  Surgeon: Grace Isaac, MD;  Location: [place];  Service: Open Heart Surgery;  Laterality: N/A;   ENDOVEIN HARVEST OF GREATER SAPHENOUS VEIN Right 08/29/2020   Procedure: ENDOVEIN HARVEST OF GREATER SAPHENOUS VEIN;  Surgeon: Grace Isaac, MD;  Location: [facility];  Service: Open Heart Surgery;  Laterality: Right;   HERNIA REPAIR  LEFT HEART CATH AND CORONARY ANGIOGRAPHY N/A 08/25/2020   Procedure: LEFT HEART CATH AND CORONARY ANGIOGRAPHY;  Surgeon: [name], MD;  Location: [facility];  Service: Cardiovascular;  Laterality: N/A;   RIGHT/LEFT HEART CATH AND CORONARY ANGIOGRAPHY N/A 08/31/2021   Procedure: RIGHT/LEFT HEART CATH AND CORONARY ANGIOGRAPHY;  Surgeon: [name], MD;  Location: [facility];  Service: Cardiovascular;  Laterality: N/A;   TEE WITHOUT CARDIOVERSION N/A 08/29/2020   Procedure: TRANSESOPHAGEAL ECHOCARDIOGRAM (TEE);  Surgeon:  Grace Isaac, MD;  Location: [place] [facility] [place];  Service: Open Heart Surgery;  Laterality: N/A;   Family History  Problem Relation Age of Onset   Heart attack Mother    Social History   Tobacco Use   Smoking status: Former    Types: Cigarettes    Quit date: 07/26/1996    Years since quitting: 25.1   Smokeless tobacco: Never  Substance Use Topics   Alcohol use: Not Currently   Allergies  Allergen Reactions   Angiotensin Receptor Blockers     hyperkalemia   Metformin Diarrhea   Spironolactone     Hyperkalemia    Prior to Admission medications   Medication Sig Start Date End Date Taking? Authorizing Provider  acetaminophen (TYLENOL) 500 MG tablet Take 500-1,000 mg by mouth every 6 (six) hours as needed for mild pain or moderate pain.    Yes [provider]  acyclovir (ZOVIRAX) 800 MG tablet Take 800 mg by mouth daily. 12/02/20  Yes [provider]  clopidogrel (PLAVIX) 75 MG tablet TAKE 1 TABLET BY MOUTH EVERY DAY 05/17/21  Yes Loel Dubonnet, NP  fluticasone (FLOVENT HFA) 44 MCG/ACT inhaler Inhale 2 puffs into the lungs 2 (two) times daily as needed.   Yes [provider]  furosemide (LASIX) 80 MG tablet Take 1 tablet (80 mg total) by mouth daily. 08/07/21  Yes Theora Gianotti, NP  hydrALAZINE (APRESOLINE) 25 MG tablet Take 1 tablet (25 mg total) by mouth 3 (three) times daily. 08/07/21 11/05/21 Yes Theora Gianotti, NP  insulin detemir (LEVEMIR) 100 UNIT/ML injection Inject 50 Units into the skin daily.   Yes [provider]  isosorbide mononitrate (IMDUR) 30 MG 24 hr tablet Take 1 tablet (30 mg total) by mouth daily. 09/02/21 10/02/21 Yes Wyvonnia Dusky, MD  magnesium oxide (MAG-OX) 400 MG tablet TAKE 1 TABLET BY MOUTH EVERY DAY 06/01/21  Yes [name], MD  Multiple Vitamin (MULTIVITAMIN WITH MINERALS) TABS tablet Take 1 tablet by mouth daily. 09/11/20  Yes Gold, Wayne E, PA-C  pantoprazole (PROTONIX) 40 MG tablet Take 40  mg by mouth 2 (two) times daily.   Yes [provider]  rosuvastatin (CRESTOR) 40 MG tablet Take 1 tablet (40 mg total) by mouth daily. 06/17/21  Yes [name], MD  traMADol (ULTRAM) 50 MG tablet Take 1 tablet (50 mg total) by mouth every 12 (twelve) hours as needed for severe pain. Further refill request will need to be sent to primary care. 02/27/21  Yes [name], MD  losartan (COZAAR) 50 MG tablet Take 1 tablet (50 mg total) by mouth daily. 09/17/21   Alisa Graff, FNP  oxybutynin (DITROPAN) 5 MG tablet Take 5 mg by mouth 2 (two) times daily. Patient not taking: Reported on 09/17/2021    [provider]   Review of Systems  Constitutional:  Positive for fatigue. Negative for appetite change.  HENT:  Negative for congestion, postnasal drip and sore throat.  Eyes: Negative.   Respiratory:  Positive for shortness of breath. Negative for cough and chest tightness.   Cardiovascular:  Positive for leg swelling. Negative for chest pain and palpitations.  Gastrointestinal:  Negative for abdominal distention and abdominal pain.  Endocrine: Negative.   Genitourinary: Negative.   Musculoskeletal:  Negative for back pain and neck pain.  Skin: Negative.   Allergic/Immunologic: Negative.   Neurological:  Negative for dizziness and light-headedness.  Hematological:  Negative for adenopathy. Does not bruise/bleed easily.  Psychiatric/Behavioral:  Negative for dysphoric mood and sleep disturbance (sleeping on 1 pillows). The patient is not nervous/anxious.    Vitals:   09/17/21 1229  BP: (!) 151/79  Pulse: (!) 57  Resp: 18  SpO2: 100%  Weight: 237 lb 2 oz (107.6 kg)  Height: 5\' 8"  (1.727 m)   Wt Readings from Last 3 Encounters:  09/17/21 237 lb 2 oz (107.6 kg)  09/15/21 235 lb (106.6 kg)  09/13/21 230 lb (104.3 kg)   Lab Results  Component Value Date   CREATININE 1.84 (H) 09/13/2021   CREATININE 1.66 (H) 09/01/2021   CREATININE 1.69 (H) 08/31/2021    Physical Exam Vitals and nursing note reviewed.  Constitutional:      Appearance: He is well-developed.     Interventions: He is not intubated. HENT:     Head: Normocephalic and atraumatic.  Neck:     Vascular: No JVD.  Cardiovascular:     Rate and Rhythm: Regular rhythm. Bradycardia present.  Pulmonary:     Effort: Pulmonary effort is normal. No tachypnea or accessory muscle usage. He is not intubated.  Abdominal:     Palpations: Abdomen is soft.     Tenderness: There is no abdominal tenderness.  Musculoskeletal:     Cervical back: Neck supple.     Right lower leg: No tenderness. Edema (trace pitting) present.     Left lower leg: No tenderness. Edema (trace pitting) present.  Skin:    General: Skin is warm and dry.  Neurological:     General: No focal deficit present.     Mental Status: He is alert and oriented to person, place, and time.  Psychiatric:        Mood and Affect: Mood normal.        Behavior: Behavior normal.    Assessment & Plan:  1: Chronic heart failure with reduced ejection fraction- - NYHA class II - euvolemic today - weighing daily; reminded to call for an overnight weight gain of >2 pounds or a weekly weight gain of > 5 pounds - not adding salt and he says that his wife doesn't cook with salt either; low sodium cookbook provided to him - due to have pacemaker implanted 10/12/21 - he asks for new RX for losartan in case he doesn't have it at home; this was provided to him today - consider adding SGLT2 but will need to watch renal function closely - had hyperkalemia with spironolactone; can not use beta blocker right now due to heart block - BNP 09/13/21 was 945.4 - has received his flu vaccine for this season  2: HTN with CKD- - BP mildly elevated (151/79) - saw PCP 07/08/21 - BMP 09/13/21 reviewed and showed sodium 143, potassium 3.4, creatinine 1.84 and GFR 36  3: Type 2 DM- - A1c 08/26/21 was 7.1% - glucose at home today was 136  4: CAD- -  saw cardiology Sharolyn Douglas) 09/15/21  5: Lymphedema-  - stage 2 - tries to elevate legs when sitting  for long periods of time - instructed to get compression socks and put them on every morning with removal at bedtime - consider compression boots if edema persists   Mediation bottles reviewed.   Return in 2 months or sooner for any questions/problems before then.

## 2021-09-17 ENCOUNTER — Encounter: Payer: Self-pay | Admitting: Family

## 2021-09-17 ENCOUNTER — Other Ambulatory Visit: Payer: Self-pay

## 2021-09-17 ENCOUNTER — Ambulatory Visit: Payer: Medicare HMO | Attending: Family | Admitting: Family

## 2021-09-17 VITALS — BP 151/79 | HR 57 | Resp 18 | Ht 68.0 in | Wt 237.1 lb

## 2021-09-17 DIAGNOSIS — I272 Pulmonary hypertension, unspecified: Secondary | ICD-10-CM | POA: Insufficient documentation

## 2021-09-17 DIAGNOSIS — I10 Essential (primary) hypertension: Secondary | ICD-10-CM

## 2021-09-17 DIAGNOSIS — E785 Hyperlipidemia, unspecified: Secondary | ICD-10-CM | POA: Insufficient documentation

## 2021-09-17 DIAGNOSIS — Z951 Presence of aortocoronary bypass graft: Secondary | ICD-10-CM | POA: Diagnosis not present

## 2021-09-17 DIAGNOSIS — I130 Hypertensive heart and chronic kidney disease with heart failure and stage 1 through stage 4 chronic kidney disease, or unspecified chronic kidney disease: Secondary | ICD-10-CM | POA: Diagnosis not present

## 2021-09-17 DIAGNOSIS — I4430 Unspecified atrioventricular block: Secondary | ICD-10-CM | POA: Diagnosis not present

## 2021-09-17 DIAGNOSIS — N183 Chronic kidney disease, stage 3 unspecified: Secondary | ICD-10-CM | POA: Diagnosis not present

## 2021-09-17 DIAGNOSIS — I5022 Chronic systolic (congestive) heart failure: Secondary | ICD-10-CM | POA: Diagnosis present

## 2021-09-17 DIAGNOSIS — I2510 Atherosclerotic heart disease of native coronary artery without angina pectoris: Secondary | ICD-10-CM | POA: Insufficient documentation

## 2021-09-17 DIAGNOSIS — Z794 Long term (current) use of insulin: Secondary | ICD-10-CM

## 2021-09-17 DIAGNOSIS — N1832 Chronic kidney disease, stage 3b: Secondary | ICD-10-CM

## 2021-09-17 DIAGNOSIS — I890 Lymphedema, not elsewhere classified: Secondary | ICD-10-CM

## 2021-09-17 DIAGNOSIS — Z8674 Personal history of sudden cardiac arrest: Secondary | ICD-10-CM | POA: Insufficient documentation

## 2021-09-17 DIAGNOSIS — E1122 Type 2 diabetes mellitus with diabetic chronic kidney disease: Secondary | ICD-10-CM | POA: Diagnosis not present

## 2021-09-17 DIAGNOSIS — I5020 Unspecified systolic (congestive) heart failure: Secondary | ICD-10-CM

## 2021-09-17 DIAGNOSIS — Z79899 Other long term (current) drug therapy: Secondary | ICD-10-CM | POA: Diagnosis not present

## 2021-09-17 DIAGNOSIS — Z87891 Personal history of nicotine dependence: Secondary | ICD-10-CM | POA: Diagnosis not present

## 2021-09-17 MED ORDER — LOSARTAN POTASSIUM 50 MG PO TABS: 50.0000 mg | ORAL_TABLET | Freq: Every day | ORAL | 5 refills | Status: DC

## 2021-09-17 NOTE — Patient Instructions (Addendum)
Continue weighing daily and call for an overnight weight gain of > 2 pounds or a weekly weight gain of >5 pounds.    Begin wearing compression socks daily with removal at bedtime

## 2021-10-07 ENCOUNTER — Encounter: Payer: Self-pay | Admitting: Nurse Practitioner

## 2021-10-07 ENCOUNTER — Ambulatory Visit (INDEPENDENT_AMBULATORY_CARE_PROVIDER_SITE_OTHER): Payer: Medicare HMO | Admitting: Nurse Practitioner

## 2021-10-07 ENCOUNTER — Other Ambulatory Visit: Payer: Self-pay

## 2021-10-07 VITALS — BP 140/78 | HR 62 | Ht 68.0 in | Wt 238.0 lb

## 2021-10-07 DIAGNOSIS — I5042 Chronic combined systolic (congestive) and diastolic (congestive) heart failure: Secondary | ICD-10-CM

## 2021-10-07 DIAGNOSIS — E785 Hyperlipidemia, unspecified: Secondary | ICD-10-CM

## 2021-10-07 DIAGNOSIS — I453 Trifascicular block: Secondary | ICD-10-CM

## 2021-10-07 DIAGNOSIS — I10 Essential (primary) hypertension: Secondary | ICD-10-CM

## 2021-10-07 DIAGNOSIS — I2510 Atherosclerotic heart disease of native coronary artery without angina pectoris: Secondary | ICD-10-CM | POA: Diagnosis not present

## 2021-10-07 DIAGNOSIS — I779 Disorder of arteries and arterioles, unspecified: Secondary | ICD-10-CM

## 2021-10-07 DIAGNOSIS — N183 Chronic kidney disease, stage 3 unspecified: Secondary | ICD-10-CM

## 2021-10-07 MED ORDER — HYDRALAZINE HCL 25 MG PO TABS
25.0000 mg | ORAL_TABLET | Freq: Three times a day (TID) | ORAL | 1 refills | Status: DC
Start: 2021-10-07 — End: 2021-10-30

## 2021-10-07 NOTE — Patient Instructions (Signed)
Medication Instructions:   Your physician has recommended you make the following change in your medication:    INCREASE your Hydralazine to 25 MG three times a day.  2.   START HOLDING your Plavix tomorrow 10/08/21 for your procedure on Monday 10/12/21.  *If you need a refill on your cardiac medications before your next appointment, please call your pharmacy*   Lab Work:  CBC, BMP drawn today in office   Testing/Procedures:  None ordered   Follow-Up: At [facility], you and your health needs are our priority.  As part of our continuing mission to provide you with exceptional heart care, we have created designated Provider Care Teams.  These Care Teams include your primary Cardiologist (physician) and Advanced Practice Providers (APPs -  Physician Assistants and Nurse Practitioners) who all work together to provide you with the care you need, when you need it.  We recommend signing up for the patient portal called "MyChart".  Sign up information is provided on this After Visit Summary.  MyChart is used to connect with patients for Virtual Visits (Telemedicine).  Patients are able to view lab/test results, encounter notes, upcoming appointments, etc.  Non-urgent messages can be sent to your provider as well.   To learn more about what you can do with MyChart, go to [email or web address].    Your next appointment:   2 month(s)  The format for your next appointment:   In Person  Provider:   You may see [name], MD or one of the following Advanced Practice Providers on your designated Care Team:   Murray Hodgkins, NP Christell Faith, PA-C Cadence Kathlen Mody, [place]    Other Instructions
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
## 2021-10-08 LAB — BASIC METABOLIC PANEL
BUN/Creatinine Ratio: 12 (ref 10–24)
BUN: 21 mg/dL (ref 8–27)
CO2: 23 mmol/L (ref 20–29)
Calcium: 8 mg/dL — ABNORMAL LOW (ref 8.6–10.2)
Chloride: 109 mmol/L — ABNORMAL HIGH (ref 96–106)
Creatinine, Ser: 1.7 mg/dL — ABNORMAL HIGH (ref 0.76–1.27)
Glucose: 149 mg/dL — ABNORMAL HIGH (ref 70–99)
Potassium: 4.1 mmol/L (ref 3.5–5.2)
Sodium: 146 mmol/L — ABNORMAL HIGH (ref 134–144)
eGFR: 39 mL/min/{1.73_m2} — ABNORMAL LOW (ref >59)

## 2021-10-08 LAB — CBC
Hematocrit: 39.7 % (ref 37.5–51.0)
Hemoglobin: 12.7 g/dL — ABNORMAL LOW (ref 13.0–17.7)
MCH: 27.5 pg (ref 26.6–33.0)
MCHC: 32 g/dL (ref 31.5–35.7)
MCV: 86 fL (ref 79–97)
Platelets: 181 10*3/uL (ref 150–450)
RBC: 4.62 x10E6/uL (ref 4.14–5.80)
RDW: 13.8 % (ref 11.6–15.4)
WBC: 4.2 10*3/uL (ref 3.4–10.8)

## 2021-10-09 ENCOUNTER — Ambulatory Visit (INDEPENDENT_AMBULATORY_CARE_PROVIDER_SITE_OTHER): Payer: Medicare HMO

## 2021-10-09 ENCOUNTER — Other Ambulatory Visit: Payer: Self-pay

## 2021-10-09 DIAGNOSIS — I779 Disorder of arteries and arterioles, unspecified: Secondary | ICD-10-CM | POA: Diagnosis not present

## 2021-10-09 NOTE — Pre-Procedure Instructions (Signed)
Instructed patient on the following items: Arrival time 1000 Nothing to eat or drink after midnight No meds AM of procedure Responsible person to drive you home and stay with you for 24 hrs Wash with special soap night before and morning of procedure If on anti-coagulant drug instructions Plavix- 12/1  1/2 dose of insulin on Sunday night

## 2021-10-12 ENCOUNTER — Observation Stay (HOSPITAL_COMMUNITY)
Admission: RE | Admit: 2021-10-12 | Discharge: 2021-10-13 | Disposition: A | Discharge status: Home / Self Care | Payer: Medicare HMO | Attending: Internal Medicine | Admitting: Internal Medicine

## 2021-10-12 ENCOUNTER — Other Ambulatory Visit: Payer: Self-pay

## 2021-10-12 ENCOUNTER — Encounter (HOSPITAL_COMMUNITY)
Admission: RE | Disposition: A | Discharge status: Home / Self Care | Payer: Medicaid Other | Source: Home / Self Care | Attending: Internal Medicine

## 2021-10-12 DIAGNOSIS — N183 Chronic kidney disease, stage 3 unspecified: Secondary | ICD-10-CM | POA: Diagnosis not present

## 2021-10-12 DIAGNOSIS — I5042 Chronic combined systolic (congestive) and diastolic (congestive) heart failure: Secondary | ICD-10-CM | POA: Insufficient documentation

## 2021-10-12 DIAGNOSIS — I255 Ischemic cardiomyopathy: Secondary | ICD-10-CM | POA: Diagnosis not present

## 2021-10-12 DIAGNOSIS — I442 Atrioventricular block, complete: Secondary | ICD-10-CM

## 2021-10-12 DIAGNOSIS — I2510 Atherosclerotic heart disease of native coronary artery without angina pectoris: Secondary | ICD-10-CM | POA: Insufficient documentation

## 2021-10-12 DIAGNOSIS — I453 Trifascicular block: Secondary | ICD-10-CM | POA: Insufficient documentation

## 2021-10-12 DIAGNOSIS — E119 Type 2 diabetes mellitus without complications: Secondary | ICD-10-CM | POA: Diagnosis not present

## 2021-10-12 DIAGNOSIS — Z950 Presence of cardiac pacemaker: Secondary | ICD-10-CM

## 2021-10-12 DIAGNOSIS — I4510 Unspecified right bundle-branch block: Secondary | ICD-10-CM | POA: Diagnosis not present

## 2021-10-12 DIAGNOSIS — Z951 Presence of aortocoronary bypass graft: Secondary | ICD-10-CM | POA: Diagnosis not present

## 2021-10-12 DIAGNOSIS — I130 Hypertensive heart and chronic kidney disease with heart failure and stage 1 through stage 4 chronic kidney disease, or unspecified chronic kidney disease: Secondary | ICD-10-CM | POA: Insufficient documentation

## 2021-10-12 DIAGNOSIS — Z79899 Other long term (current) drug therapy: Secondary | ICD-10-CM | POA: Insufficient documentation

## 2021-10-12 DIAGNOSIS — Z87891 Personal history of nicotine dependence: Secondary | ICD-10-CM | POA: Diagnosis not present

## 2021-10-12 HISTORY — PX: PACEMAKER IMPLANT: EP1218

## 2021-10-12 LAB — GLUCOSE, CAPILLARY
Glucose-Capillary: 92 mg/dL (ref 70–99)
Glucose-Capillary: 125 mg/dL — ABNORMAL HIGH (ref 70–99)
Glucose-Capillary: 61 mg/dL — ABNORMAL LOW (ref 70–99)
Glucose-Capillary: 182 mg/dL — ABNORMAL HIGH (ref 70–99)

## 2021-10-12 SURGERY — PACEMAKER IMPLANT

## 2021-10-12 MED ORDER — FENTANYL CITRATE (PF) 100 MCG/2ML IJ SOLN
INTRAMUSCULAR | Status: AC
  Filled 2021-10-12: qty 2

## 2021-10-12 MED ORDER — HYDRALAZINE HCL 25 MG PO TABS
25.0000 mg | ORAL_TABLET | Freq: Three times a day (TID) | ORAL | Status: DC
  Administered 2021-10-12 – 2021-10-13 (×2): 25 mg via ORAL
  Filled 2021-10-12 (×2): qty 1

## 2021-10-12 MED ORDER — LIDOCAINE HCL (PF) 1 % IJ SOLN
INTRAMUSCULAR | Status: DC | PRN
  Administered 2021-10-12: 60 mL

## 2021-10-12 MED ORDER — PANTOPRAZOLE SODIUM 40 MG PO TBEC
40.0000 mg | DELAYED_RELEASE_TABLET | Freq: Two times a day (BID) | ORAL | Status: DC
  Administered 2021-10-12 – 2021-10-13 (×2): 40 mg via ORAL
  Filled 2021-10-12 (×2): qty 1

## 2021-10-12 MED ORDER — ONDANSETRON HCL 4 MG/2ML IJ SOLN
INTRAMUSCULAR | Status: DC | PRN
  Administered 2021-10-12: 4 mg via INTRAVENOUS

## 2021-10-12 MED ORDER — DEXTROSE 50 % IV SOLN
INTRAVENOUS | Status: DC | PRN
  Administered 2021-10-12: .5 via INTRAVENOUS

## 2021-10-12 MED ORDER — MIDAZOLAM HCL 5 MG/5ML IJ SOLN
INTRAMUSCULAR | Status: AC
  Filled 2021-10-12: qty 5

## 2021-10-12 MED ORDER — LOSARTAN POTASSIUM 25 MG PO TABS: 25.0000 mg | ORAL_TABLET | Freq: Every day | ORAL | Status: DC

## 2021-10-12 MED ORDER — DEXTROSE 50 % IV SOLN
INTRAVENOUS | Status: AC
  Filled 2021-10-12: qty 50

## 2021-10-12 MED ORDER — SODIUM CHLORIDE 0.9 % IV SOLN
80.0000 mg | INTRAVENOUS | Status: AC
  Administered 2021-10-12: 80 mg

## 2021-10-12 MED ORDER — INSULIN DETEMIR 100 UNIT/ML ~~LOC~~ SOLN
30.0000 [IU] | Freq: Every day | SUBCUTANEOUS | Status: DC
  Administered 2021-10-12: 30 [IU] via SUBCUTANEOUS
  Filled 2021-10-12 (×2): qty 0.3

## 2021-10-12 MED ORDER — IOHEXOL 350 MG/ML SOLN
INTRAVENOUS | Status: DC | PRN
  Administered 2021-10-12: 15 mL

## 2021-10-12 MED ORDER — SODIUM CHLORIDE 0.9 % IV SOLN
INTRAVENOUS | Status: AC
  Filled 2021-10-12: qty 2

## 2021-10-12 MED ORDER — OXYBUTYNIN CHLORIDE 5 MG PO TABS
5.0000 mg | ORAL_TABLET | Freq: Two times a day (BID) | ORAL | Status: DC
  Administered 2021-10-12 – 2021-10-13 (×2): 5 mg via ORAL
  Filled 2021-10-12 (×2): qty 1

## 2021-10-12 MED ORDER — LIDOCAINE HCL (PF) 1 % IJ SOLN
INTRAMUSCULAR | Status: AC
  Filled 2021-10-12: qty 30

## 2021-10-12 MED ORDER — SODIUM CHLORIDE 0.9 % IV SOLN: INTRAVENOUS | Status: DC

## 2021-10-12 MED ORDER — ONDANSETRON HCL 4 MG/2ML IJ SOLN: 4.0000 mg | Freq: Four times a day (QID) | INTRAMUSCULAR | Status: DC | PRN

## 2021-10-12 MED ORDER — FENTANYL CITRATE (PF) 100 MCG/2ML IJ SOLN
INTRAMUSCULAR | Status: DC | PRN
  Administered 2021-10-12: 12.5 ug via INTRAVENOUS

## 2021-10-12 MED ORDER — ACETAMINOPHEN 325 MG PO TABS: 325.0000 mg | ORAL_TABLET | ORAL | Status: DC | PRN

## 2021-10-12 MED ORDER — HEPARIN (PORCINE) IN NACL 1000-0.9 UT/500ML-% IV SOLN
INTRAVENOUS | Status: DC | PRN
  Administered 2021-10-12: 500 mL

## 2021-10-12 MED ORDER — CEFAZOLIN SODIUM-DEXTROSE 2-4 GM/100ML-% IV SOLN
2.0000 g | INTRAVENOUS | Status: AC
  Administered 2021-10-12: 2 g via INTRAVENOUS

## 2021-10-12 MED ORDER — MIDAZOLAM HCL 5 MG/5ML IJ SOLN
INTRAMUSCULAR | Status: DC | PRN
  Administered 2021-10-12: .5 mg via INTRAVENOUS

## 2021-10-12 MED ORDER — CHLORHEXIDINE GLUCONATE 4 % EX LIQD: 4.0000 "application " | Freq: Once | CUTANEOUS | Status: DC

## 2021-10-12 MED ORDER — ONDANSETRON HCL 4 MG/2ML IJ SOLN
INTRAMUSCULAR | Status: AC
  Filled 2021-10-12: qty 2

## 2021-10-12 MED ORDER — POVIDONE-IODINE 10 % EX SWAB: 2.0000 "application " | Freq: Once | CUTANEOUS | Status: DC

## 2021-10-12 MED ORDER — ISOSORBIDE MONONITRATE ER 30 MG PO TB24
30.0000 mg | ORAL_TABLET | Freq: Every day | ORAL | Status: DC
  Administered 2021-10-13: 30 mg via ORAL
  Filled 2021-10-12: qty 1

## 2021-10-12 MED ORDER — ROSUVASTATIN CALCIUM 20 MG PO TABS
40.0000 mg | ORAL_TABLET | Freq: Every day | ORAL | Status: DC
  Administered 2021-10-12: 40 mg via ORAL
  Filled 2021-10-12: qty 2

## 2021-10-12 MED ORDER — CEFAZOLIN SODIUM-DEXTROSE 2-4 GM/100ML-% IV SOLN
INTRAVENOUS | Status: AC
  Filled 2021-10-12: qty 100

## 2021-10-12 SURGICAL SUPPLY — 19 items
BALLN COR SINUS VENO 6FR 80 (BALLOONS) ×2
BALLOON COR SINUS VENO 6FR 80 (BALLOONS) ×1 IMPLANT
CABLE SURGICAL S-101-97-12 (CABLE) ×2 IMPLANT
CATH CPS DIRECT 135 DS2C020 (CATHETERS) ×2 IMPLANT
CATH RIGHTSITE C315HIS02 (CATHETERS) ×2 IMPLANT
LEAD SELECT SECURE 3830 383069 (Lead) ×1 IMPLANT
LEAD TENDRIL MRI 52CM LPA1200M (Lead) ×2 IMPLANT
LEAD TENDRIL MRI 58CM LPA1200M (Lead) ×2 IMPLANT
PACEMAKER ALLR CRT-P RF PM3222 (Pacemaker) ×1 IMPLANT
PAD DEFIB RADIO PHYSIO CONN (PAD) ×2 IMPLANT
PPM ALLURE CRT-P RF PM3222 (Pacemaker) ×2 IMPLANT
SELECT SECURE 3830 383069 (Lead) ×2 IMPLANT
SHEATH 8FR PRELUDE SNAP 13 (SHEATH) ×4 IMPLANT
SHEATH 9.5FR PRELUDE SNAP 13 (SHEATH) ×2 IMPLANT
SHEATH PROBE COVER 6X72 (BAG) ×4 IMPLANT
SLITTER 6232ADJ (MISCELLANEOUS) ×2 IMPLANT
TRAY PACEMAKER INSERTION (PACKS) ×2 IMPLANT
WIRE ACUITY WHISPER EDS 4648 (WIRE) ×4 IMPLANT
WIRE HI TORQ VERSACORE-J 145CM (WIRE) ×2 IMPLANT

## 2021-10-12 NOTE — H&P (Signed)
Electrophysiology Office Note:     Date:  09/09/2021    ID:  [name], DOB 06-Sep-1935, MRN [id number]   PCP:  Tomasita Morrow, MD  [facility] Cardiologist:  [name], MD  [facility] Electrophysiologist:  Vickie Epley, MD    Referring MD: Tomasita Morrow, *    Chief Complaint: CHB   History of Present Illness:     [name] is a 86 y.o. male who presents for an evaluation of CHB at the request of Roby Lofts, PA-C. Their medical history includes HFpEF, HTN, HLD, DM, GERD, CKDIIIa, CAD s/p CABG, cardiac arrest. He was recently admitted in October for shortness of breath with exertion. He was found to have HR in the 30s. He was symptomatic with shortness of breath at the time.  He denied presyncope or syncope.  He has since been given a heart monitor which showed multiple episodes of complete heart block mostly at night.   Review of EKGs from the day of admission to the hospital showed 2-1 AV block and what looks like high degree AV block.  Ventricular rates would dip into the 30s during his Wenckebach sequences.  Today presents for follow-up and to discuss possible pacemaker implant.         Objective        Past Medical History:  Diagnosis Date   CAD (coronary artery disease)      a. 08/2019 VF arrest-->sev 3vd on cath-->CABGx5 (LIMA->LAD, VG->OM->LCX, VG->RPDA, VG->Diag).   CKD (chronic kidney disease), stage III (HCC)     Diabetes mellitus without complication ([place])     HFimpEF (heart failure with improved ejection fraction) ([place])      a. 08/2020 Echo: EF 40-45%; b. 11/2020 Echo: EF 55%, no rwma, mild LVH, Gr2 DD. nl RV fxn.   Hyperlipidemia LDL goal <70     Hypertension     Ischemic cardiomyopathy      a. 08/2020 Echo: EF 40-45%; b. 11/2020 Echo: EF 55%.           Past Surgical History:  Procedure Laterality Date   BACK SURGERY       CARDIAC CATHETERIZATION       CLIPPING OF ATRIAL APPENDAGE N/A 08/29/2020    Procedure: CLIPPING  OF ATRIAL APPENDAGE USING ATRICURE 34 MM ATRICLIP FLEX-V;  Surgeon: Grace Isaac, MD;  Location: [place];  Service: Open Heart Surgery;  Laterality: N/A;   CORONARY ARTERY BYPASS GRAFT N/A 08/29/2020    Procedure: CORONARY ARTERY BYPASS GRAFTING (CABG) X 5 USING LEFT INTERNAL MAMMARY ARTERY AND ENDOSCOPICALLY HARVESTED RIGHT GREATER SAPHENOUS VEIN. LIMA TO LAD, SVG TO OM SEQ TO CIRC, SVG TO PD, SVG TO DIAG.;  Surgeon: Grace Isaac, MD;  Location: [place];  Service: Open Heart Surgery;  Laterality: N/A;   ENDOVEIN HARVEST OF GREATER SAPHENOUS VEIN Right 08/29/2020    Procedure: ENDOVEIN HARVEST OF GREATER SAPHENOUS VEIN;  Surgeon: Grace Isaac, MD;  Location: [place];  Service: Open Heart Surgery;  Laterality: Right;   HERNIA REPAIR       LEFT HEART CATH AND CORONARY ANGIOGRAPHY N/A 08/25/2020    Procedure: LEFT HEART CATH AND CORONARY ANGIOGRAPHY;  Surgeon: [name], MD;  Location: [place] CV LAB;  Service: Cardiovascular;  Laterality: N/A;   RIGHT/LEFT HEART CATH AND CORONARY ANGIOGRAPHY N/A 08/31/2021    Procedure: RIGHT/LEFT HEART CATH AND CORONARY ANGIOGRAPHY;  Surgeon: [name], MD;  Location: [place] CV LAB;  Service: Cardiovascular;  Laterality: N/A;   TEE WITHOUT CARDIOVERSION N/A 08/29/2020    Procedure: TRANSESOPHAGEAL ECHOCARDIOGRAM (TEE);  Surgeon: Grace Isaac, MD;  Location: [place];  Service: Open Heart Surgery;  Laterality: N/A;      Current Medications: Active Medications      Current Meds  Medication Sig   acetaminophen (TYLENOL) 500 MG tablet Take 500-1,000 mg by mouth every 6 (six) hours as needed for mild pain or moderate pain.    acyclovir (ZOVIRAX) 800 MG tablet Take 800 mg by mouth daily.   clopidogrel (PLAVIX) 75 MG tablet TAKE 1 TABLET BY MOUTH EVERY DAY   fluticasone (FLOVENT HFA) 44 MCG/ACT inhaler Inhale 2 puffs into the lungs 2 (two) times daily.   furosemide (LASIX) 80 MG tablet Take 1 tablet (80 mg total) by mouth  daily.   hydrALAZINE (APRESOLINE) 25 MG tablet Take 1 tablet (25 mg total) by mouth 3 (three) times daily.   insulin detemir (LEVEMIR) 100 UNIT/ML injection Inject 0.2 mLs (20 Units total) into the skin 2 (two) times daily.   isosorbide mononitrate (IMDUR) 30 MG 24 hr tablet Take 1 tablet (30 mg total) by mouth daily.   losartan (COZAAR) 50 MG tablet Take 50 mg by mouth daily.   magnesium oxide (MAG-OX) 400 MG tablet TAKE 1 TABLET BY MOUTH EVERY DAY   Multiple Vitamin (MULTIVITAMIN WITH MINERALS) TABS tablet Take 1 tablet by mouth daily.   oxybutynin (DITROPAN) 5 MG tablet Take 5 mg by mouth 2 (two) times daily.   pantoprazole (PROTONIX) 40 MG tablet Take 40 mg by mouth 2 (two) times daily.   rosuvastatin (CRESTOR) 40 MG tablet Take 1 tablet (40 mg total) by mouth daily.   traMADol (ULTRAM) 50 MG tablet Take 1 tablet (50 mg total) by mouth every 12 (twelve) hours as needed for severe pain. Further refill request will need to be sent to primary care.        Allergies:   Angiotensin receptor blockers, Metformin, and Spironolactone    Social History         Socioeconomic History   Marital status: Married      Spouse name: Not on file   Number of children: Not on file   Years of education: Not on file   Highest education level: Not on file  Occupational History   Not on file  Tobacco Use   Smoking status: Former      Types: Cigarettes      Quit date: 07/26/1996      Years since quitting: 25.1   Smokeless tobacco: Never  Vaping Use   Vaping Use: Never used  Substance and Sexual Activity   Alcohol use: Not Currently   Drug use: Never   Sexual activity: Yes  Other Topics Concern   Not on file  Social History Narrative   Not on file    Social Determinants of Health    Financial Resource Strain: Not on file  Food Insecurity: Not on file  Transportation Needs: Not on file  Physical Activity: Not on file  Stress: Not on file  Social Connections: Not on file      Family  History: The patient's family history includes Heart attack in his mother.   ROS:   Please see the history of present illness.    All other systems reviewed and are negative.   EKGs/Labs/Other Studies Reviewed:     The following studies were reviewed today:   08/27/2021 Echo LV function moderate reduced, 35% RV function reduced  LA mildly dilated No MR   08/27/2021 ECGs reviewed - show sinus rhythm with blocked PACs, V rates upper 30s. Marked ST/T wave abnormality with prolonged QT.   08/26/2021 ECG - sinus rhythm. RBBB, blocked PACs. Leftward axis. First degree av delay.   08/31/2021 LHC 1.  Severe underlying three-vessel coronary artery disease with patent grafts including LIMA to LAD, SVG to large diagonal, sequential SVG to OM /distal left circumflex and SVG to right PDA. 2.  Right heart catheterization showed normal right and left-sided filling pressures, minimal pulmonary hypertension and normal cardiac output.     EKG:  The ekg ordered today demonstrates sinus rhythm.  Wenckebach.  PVC.     Recent Labs: 08/26/2021: ALT 93; B Natriuretic Peptide 1,040.2 09/01/2021: BUN 28; Creatinine, Ser 1.66; Hemoglobin 11.1; Magnesium 2.1; Platelets 173; Potassium 3.8; Sodium 140  Recent Lipid Panel Labs (Brief)          Component Value Date/Time    CHOL 114 08/27/2021 0628    CHOL 133 05/22/2013 0225    TRIG 78 08/27/2021 0628    TRIG 68 05/22/2013 0225    HDL 45 08/27/2021 0628    HDL 50 05/22/2013 0225    CHOLHDL 2.5 08/27/2021 0628    VLDL 16 08/27/2021 0628    VLDL 14 05/22/2013 0225    LDLCALC 53 08/27/2021 0628    LDLCALC 69 05/22/2013 0225        Physical Exam:     VS:  BP (!) 160/90 (BP Location: Left Arm, Patient Position: Sitting, Cuff Size: Normal)   Pulse (!) 59   Ht 5\' 8"  (1.727 m)   Wt 238 lb (108 kg)   SpO2 96%   BMI 36.19 kg/m         Wt Readings from Last 3 Encounters:  09/09/21 238 lb (108 kg)  09/01/21 228 lb 13.4 oz (103.8 kg)  08/07/21 232  lb (105.2 kg)      GEN:  Well nourished, well developed in no acute distress HEENT: Normal NECK: No JVD; No carotid bruits LYMPHATICS: No lymphadenopathy CARDIAC: RRR, no murmurs, rubs, gallops RESPIRATORY:  Clear to auscultation without rales, wheezing or rhonchi  ABDOMEN: Soft, non-tender, non-distended MUSCULOSKELETAL:  No edema; No deformity  SKIN: Warm and dry NEUROLOGIC:  Alert and oriented x 3 PSYCHIATRIC:  Normal affect          Assessment     ASSESSMENT:     1. Chronic combined systolic and diastolic heart failure ([place])   2. Coronary artery disease of native artery of native heart with stable angina pectoris ([place])   3. Hx of CABG   4. Trifascicular block     PLAN:     In order of problems listed above:   #Chronic mild systolic congestive heart failure secondary to ischemic cardiomyopathy post CABG No ischemic symptoms today.  Slightly volume overloaded I will have him increase his Lasix to twice daily for the next 2 days.  He will then resume once a day dosing.  Continue losartan, Imdur, hydralazine. CRT as below.  #Trifascicular block, transient complete heart block and high degree AV block during recent hospital admission At this point, I am concerned that he is having symptomatic bradycardia associate with his trifascicular block in Wenckebach sequences with bradycardia. I believe a pacemaker is indicated and I suspect he will have a high pacing burden.  In light of his reduced ejection fraction, favor CRT-P implant.  I discussed the procedure in detail with the patient including  the risks and recovery and he wishes to proceed.  We will hold his Plavix for 3 days prior to the procedure.   Risks, benefits, alternatives to PPM implantation were discussed in detail with the patient today. The patient understands that the risks include but are not limited to bleeding, infection, pneumothorax, perforation, tamponade, vascular damage, renal failure, MI, stroke, death,  and lead dislodgement and wishes to proceed.  We will therefore schedule device implantation at the next available time.   Plan for [facility] CRT-P.               Total time spent with patient today 45 minutes. This includes reviewing records, evaluating the patient and coordinating care.   Medication Adjustments/Labs and Tests Ordered: Current medicines are reviewed at length with the patient today.  Concerns regarding medicines are outlined above.     Orders Placed This Encounter  Procedures   EKG 12-Lead    No orders of the defined types were placed in this encounter.       Signed, Hilton Cork. Quentin Ore, MD, [facility], [facility] 09/09/2021 10:06 AM    Electrophysiology [facility]      -------------------------------------  I have seen, examined the patient, and reviewed the above assessment and plan.    Plan for CRT-P today. Procedure reviewed.    Vickie Epley, MD 10/12/2021 11:32 AM

## 2021-10-12 NOTE — Discharge Summary (Signed)
ELECTROPHYSIOLOGY PROCEDURE DISCHARGE SUMMARY    Patient ID: [name],  MRN: [id number], DOB/AGE: 03-15-1935 86 y.o.  Admit date: 10/12/2021 Discharge date: 10/13/21  Primary Care Physician: Tomasita Morrow, MD Primary Cardiologist: Dr. Fletcher Anon Electrophysiologist: Dr. Quentin Ore  Primary Discharge Diagnosis:  Advanced conduction system disease including CHB RBBB  Secondary Discharge Diagnosis:  CAD Complicated by VF arrest CABG hx CKD (III) HLD HTN ICM CHF Chronic combined CHF  Allergies  Allergen Reactions   Angiotensin Receptor Blockers     hyperkalemia   Metformin Diarrhea   Spironolactone     Hyperkalemia      Procedures This Admission:  1.  Implantation of an Abbott CRT-P on 10/12/21 by Dr Quentin Ore.  The patient received  Allure RF CRT-P generator (serial [id number])  There were no immediate post procedure complications. CXR on 10/13/21 demonstrated no pneumothorax status post device implantation.   Brief HPI: [name] is a 86 y.o. male was referred to electrophysiology in the outpatient setting for consideration of PPM implantation.  Past medical history includes above.  The patient has had symptomatic bradycardia without reversible causes identified.  Risks, benefits, and alternatives to PPM implantation were reviewed with the patient who wished to proceed.   Hospital Course:  The patient was admitted and underwent implantation of a CRT-P with details as outlined above.  He was monitored on telemetry overnight which demonstrated SR/V pacing, AV pacing.  Left chest was without hematoma or ecchymosis.  The device was interrogated and found to be functioning normally.  CXR was obtained and demonstrated no pneumothorax status post device implantation.  Wound care, arm mobility, and restrictions were reviewed with the patient.  The patient feels well, denies any CP/SOB, with minimal site discomfort.  He was examined by Dr. Quentin Ore and considered stable for  discharge to home.   Hold plavix 5 days   Physical Exam: Vitals:   10/12/21 2349 10/13/21 0353 10/13/21 0354 10/13/21 0900  BP: (!) 141/98 139/75 137/76 124/68  Pulse: 80 69 71   Resp: 11 (!) 0 (!) 40 14  Temp: 98.6 F (37 C)  98.4 F (36.9 C)   TempSrc: Oral  Oral   SpO2: 96%  96%   Weight:   102.5 kg   Height:        GEN- The patient is well appearing, alert and oriented x 3 today.   HEENT: normocephalic, atraumatic; sclera clear, conjunctiva pink; hearing intact; oropharynx clear; neck supple, no JVP Lungs-  CTA b/l, normal work of breathing.  No wheezes, rales, rhonchi Heart- RRR, no murmurs, rubs or gallops, PMI not laterally displacedGI- soft, non-tender, non-distended Extremities- no clubbing, cyanosis, or edema MS- no significant deformity or atrophy Skin- warm and dry, no rash or lesion, left chest without hematoma/ecchymosis Psych- euthymic mood, full affect Neuro- no gross deficits   Labs:   Lab Results  Component Value Date   WBC 4.2 10/07/2021   HGB 12.7 (L) 10/07/2021   HCT 39.7 10/07/2021   MCV 86 10/07/2021   PLT 181 10/07/2021    Recent Labs  Lab 10/07/21 1548  NA 146*  K 4.1  CL 109*  CO2 23  BUN 21  CREATININE 1.70*  CALCIUM 8.0*  GLUCOSE 149*    Discharge Medications:  Allergies as of 10/13/2021       Reactions   Angiotensin Receptor Blockers    hyperkalemia   Metformin Diarrhea   Spironolactone    Hyperkalemia        Medication  List     TAKE these medications    acetaminophen 500 MG tablet Commonly known as: TYLENOL Take 500-1,000 mg by mouth every 6 (six) hours as needed for mild pain or moderate pain.   acyclovir 800 MG tablet Commonly known as: ZOVIRAX Take 800 mg by mouth daily.   albuterol 108 (90 Base) MCG/ACT inhaler Commonly known as: VENTOLIN HFA Inhale 2 puffs into the lungs every 6 (six) hours as needed for wheezing or shortness of breath.   clopidogrel 75 MG tablet Commonly known as: PLAVIX TAKE 1  TABLET BY MOUTH EVERY DAY Notes to patient: Hold 5 days, resume on 10/18/21   fluticasone 44 MCG/ACT inhaler Commonly known as: FLOVENT HFA Inhale 2 puffs into the lungs in the morning and at bedtime.   furosemide 80 MG tablet Commonly known as: LASIX Take 1 tablet (80 mg total) by mouth daily.   hydrALAZINE 25 MG tablet Commonly known as: APRESOLINE Take 1 tablet (25 mg total) by mouth 3 (three) times daily.   insulin detemir 100 UNIT/ML injection Commonly known as: LEVEMIR Inject 50 Units into the skin at bedtime.   isosorbide mononitrate 30 MG 24 hr tablet Commonly known as: IMDUR Take 1 tablet (30 mg total) by mouth daily.   losartan 25 MG tablet Commonly known as: COZAAR Take 25 mg by mouth daily.   magnesium oxide 400 MG tablet Commonly known as: MAG-OX TAKE 1 TABLET BY MOUTH EVERY DAY   multivitamin with minerals Tabs tablet Take 1 tablet by mouth daily.   oxybutynin 5 MG tablet Commonly known as: DITROPAN Take 5 mg by mouth 2 (two) times daily.   pantoprazole 40 MG tablet Commonly known as: PROTONIX Take 40 mg by mouth 2 (two) times daily.   rosuvastatin 40 MG tablet Commonly known as: CRESTOR Take 1 tablet (40 mg total) by mouth daily.   traMADol 50 MG tablet Commonly known as: ULTRAM Take 1 tablet (50 mg total) by mouth every 12 (twelve) hours as needed for severe pain. Further refill request will need to be sent to primary care.        Disposition: Home Discharge Instructions     Diet - low sodium heart healthy   Complete by: As directed    Increase activity slowly   Complete by: As directed         Duration of Discharge Encounter: Greater than 30 minutes including physician time.  Venetia Night, PA-C 10/13/2021 10:33 AM

## 2021-10-12 NOTE — Discharge Instructions (Signed)
    Supplemental Discharge Instructions for  Pacemaker/Defibrillator Patients  Activity No heavy lifting or vigorous activity with your left/right arm for 6 to 8 weeks.  Do not raise your left/right arm above your head for one week.  Gradually raise your affected arm as drawn below.            10/17/21                    10/18/21                   10/19/21                 10/20/21 __  NO DRIVING for  1 week   ; you may begin driving on   48/25/00  .  WOUND CARE Keep the wound area clean and dry.  Do not get this area wet , no showers for one week; you may shower on     . The tape/steri-strips on your wound will fall off; do not pull them off.  No bandage is needed on the site.  DO  NOT apply any creams, oils, or ointments to the wound area. If you notice any drainage or discharge from the wound, any swelling or bruising at the site, or you develop a fever > 101? F after you are discharged home, call the office at once.  Special Instructions You are still able to use cellular telephones; use the ear opposite the side where you have your pacemaker/defibrillator.  Avoid carrying your cellular phone near your device. When traveling through airports, show security personnel your identification card to avoid being screened in the metal detectors.  Ask the security personnel to use the hand wand. Avoid arc welding equipment, MRI testing (magnetic resonance imaging), TENS units (transcutaneous nerve stimulators).  Call the office for questions about other devices. Avoid electrical appliances that are in poor condition or are not properly grounded. Microwave ovens are safe to be near or to operate.

## 2021-10-13 ENCOUNTER — Inpatient Hospital Stay (HOSPITAL_COMMUNITY): Payer: Medicare HMO

## 2021-10-13 ENCOUNTER — Encounter (HOSPITAL_COMMUNITY): Payer: Self-pay | Admitting: Cardiology

## 2021-10-13 DIAGNOSIS — I130 Hypertensive heart and chronic kidney disease with heart failure and stage 1 through stage 4 chronic kidney disease, or unspecified chronic kidney disease: Secondary | ICD-10-CM | POA: Diagnosis not present

## 2021-10-13 DIAGNOSIS — N183 Chronic kidney disease, stage 3 unspecified: Secondary | ICD-10-CM | POA: Diagnosis not present

## 2021-10-13 DIAGNOSIS — I442 Atrioventricular block, complete: Secondary | ICD-10-CM | POA: Diagnosis not present

## 2021-10-13 DIAGNOSIS — E119 Type 2 diabetes mellitus without complications: Secondary | ICD-10-CM | POA: Diagnosis not present

## 2021-10-13 LAB — GLUCOSE, CAPILLARY
Glucose-Capillary: 105 mg/dL — ABNORMAL HIGH (ref 70–99)
Glucose-Capillary: 209 mg/dL — ABNORMAL HIGH (ref 70–99)

## 2021-10-13 NOTE — Care Management CC44 (Signed)
Condition Code 44 Documentation Completed  Patient Details  Name: Mylen Mangan MRN: [id number] Date of Birth: 24-Mar-1935   Condition Code 44 given:  Yes Patient signature on Condition Code 44 notice:  Yes Documentation of 2 MD's agreement:  Yes Code 44 added to claim:  Yes    Zenon Mayo, RN 10/13/2021, 8:25 AM

## 2021-10-13 NOTE — Care Management Obs Status (Signed)
[place] NOTIFICATION   Patient Details  Name: Guenther Dunshee MRN: [id number] Date of Birth: [birth year]   Medicare Observation Status Notification Given:  Yes    Zenon Mayo, RN 10/13/2021, 8:25 AM

## 2021-10-13 NOTE — TOC Progression Note (Signed)
Transition of Care [facility]) - Progression Note    Patient Details  Name: [name] MRN: [id number] Date of Birth: Dec 20, 1934  Transition of Care [facility]) CM/SW Contact  Zenon Mayo, RN Phone Number: 10/13/2021, 8:20 AM  Clinical Narrative:    From home with wife, he states she is able to assist him at home. Patient states he is independent.  He still drives but wife takes him to his doctors apt.  No issues with getting his medications.  He states his wife will transport him home today.         Expected Discharge Plan and Services                                                 Social Determinants of Health (SDOH) Interventions    Readmission Risk Interventions Readmission Risk Prevention Plan 09/10/2020 08/28/2020  Transportation Screening - Complete  HRI or [place] - Complete  Social Work Consult for [place] Planning/Counseling - Complete  Palliative Care Screening - Not Applicable  Medication Review [employer] photographer) - Complete  PCP or Specialist appointment within 3-5 days of discharge Complete -  [place] or Home Care Consult Complete -  SW Recovery Care/Counseling Consult Complete -  Palliative Care Screening Not Applicable -  [place] Not Applicable -  Some recent data might be hidden

## 2021-10-22 ENCOUNTER — Other Ambulatory Visit: Payer: Self-pay

## 2021-10-22 ENCOUNTER — Ambulatory Visit (INDEPENDENT_AMBULATORY_CARE_PROVIDER_SITE_OTHER): Payer: Medicare HMO

## 2021-10-22 DIAGNOSIS — I255 Ischemic cardiomyopathy: Secondary | ICD-10-CM

## 2021-10-22 DIAGNOSIS — I442 Atrioventricular block, complete: Secondary | ICD-10-CM | POA: Diagnosis not present

## 2021-10-22 LAB — CUP PACEART INCLINIC DEVICE CHECK
Battery Remaining Longevity: 40 mo
Battery Voltage: 2.96 V
Brady Statistic RA Percent Paced: 39 %
Brady Statistic RV Percent Paced: 94 %
Date Time Interrogation Session: 20221215115212
Implantable Lead Implant Date: 20221205
Implantable Lead Implant Date: 20221205
Implantable Lead Implant Date: 20221205
Implantable Lead Location: 753858
Implantable Lead Location: 753859
Implantable Lead Location: 753860
Implantable Lead Model: 3830
Implantable Pulse Generator Implant Date: 20221205
Lead Channel Impedance Value: 425 Ohm
Lead Channel Impedance Value: 450 Ohm
Lead Channel Impedance Value: 512.5 Ohm
Lead Channel Pacing Threshold Amplitude: 0.5 V
Lead Channel Pacing Threshold Amplitude: 0.75 V
Lead Channel Pacing Threshold Amplitude: 0.75 V
Lead Channel Pacing Threshold Pulse Width: 0.5 ms
Lead Channel Pacing Threshold Pulse Width: 0.5 ms
Lead Channel Pacing Threshold Pulse Width: 0.5 ms
Lead Channel Sensing Intrinsic Amplitude: 12 mV
Lead Channel Sensing Intrinsic Amplitude: 3 mV
Lead Channel Setting Pacing Amplitude: 3.5 V
Lead Channel Setting Pacing Amplitude: 3.5 V
Lead Channel Setting Pacing Amplitude: 3.5 V
Lead Channel Setting Pacing Pulse Width: 0.5 ms
Lead Channel Setting Pacing Pulse Width: 0.5 ms
Lead Channel Setting Sensing Sensitivity: 2 mV
Pulse Gen Model: 3222
Pulse Gen Serial Number: 3901949

## 2021-10-22 NOTE — Progress Notes (Signed)
Wound check appointment. Steri-strips removed. Wound without redness or edema. Incision edges approximated, wound well healed. Normal device function. Thresholds, sensing, and impedances consistent with implant measurements. Device programmed at 3.5V for extra safety margin until 3 month visit. Histogram distribution appropriate for patient and level of activity. 18 AMS episodes, longest episode lasting ~10 minutes this AM with peak A/ V rates of 465/104. AT/Af Burden <1%, patient is NOT on [place].   No high ventricular rates noted. Patient educated about wound care, arm mobility, lifting restrictions. Patient is enrolled in remote monitoring, next scheduled check 01/12/22.  ROV with ROV in 3 months with Dr. Quentin Ore on 01/20/22.

## 2021-10-22 NOTE — Patient Instructions (Signed)
° °  After Your Pacemaker   Monitor your pacemaker site for redness, swelling, and drainage. Call the device clinic at [phone] if you experience these symptoms or fever/chills.  Your incision was closed with Steri-strips or staples:  You may shower 7 days after your procedure and wash your incision with soap and water. Avoid lotions, ointments, or perfumes over your incision until it is well-healed.  You may use a hot tub or a pool after your wound check appointment if the incision is completely closed.  Do not lift, push or pull greater than 10 pounds with the affected arm until 6 weeks after your procedure. There are no other restrictions in arm movement after your wound check appointment.  You may drive, unless driving has been restricted by your healthcare providers.  Your Pacemaker is not MRI compatible.  Remote monitoring is used to monitor your pacemaker from home. This monitoring is scheduled every 91 days by our office. It allows [place] to keep an eye on the functioning of your device to ensure it is working properly. You will routinely see your Electrophysiologist annually (more often if necessary).

## 2021-10-27 ENCOUNTER — Inpatient Hospital Stay
Admission: EM | Admit: 2021-10-27 | Discharge: 2021-10-30 | DRG: 291 | Disposition: A | Discharge status: Home / Self Care | Payer: Medicare HMO | Attending: Hospitalist | Admitting: Hospitalist

## 2021-10-27 ENCOUNTER — Other Ambulatory Visit: Payer: Self-pay

## 2021-10-27 ENCOUNTER — Emergency Department: Payer: Medicare HMO

## 2021-10-27 DIAGNOSIS — I255 Ischemic cardiomyopathy: Secondary | ICD-10-CM | POA: Diagnosis present

## 2021-10-27 DIAGNOSIS — Z91199 Patient's noncompliance with other medical treatment and regimen due to unspecified reason: Secondary | ICD-10-CM

## 2021-10-27 DIAGNOSIS — E669 Obesity, unspecified: Secondary | ICD-10-CM | POA: Diagnosis present

## 2021-10-27 DIAGNOSIS — Z20822 Contact with and (suspected) exposure to covid-19: Secondary | ICD-10-CM | POA: Diagnosis present

## 2021-10-27 DIAGNOSIS — Z8674 Personal history of sudden cardiac arrest: Secondary | ICD-10-CM | POA: Diagnosis not present

## 2021-10-27 DIAGNOSIS — Z91119 Patient's noncompliance with dietary regimen due to unspecified reason: Secondary | ICD-10-CM

## 2021-10-27 DIAGNOSIS — N3289 Other specified disorders of bladder: Secondary | ICD-10-CM | POA: Diagnosis present

## 2021-10-27 DIAGNOSIS — Z951 Presence of aortocoronary bypass graft: Secondary | ICD-10-CM

## 2021-10-27 DIAGNOSIS — Z9861 Coronary angioplasty status: Secondary | ICD-10-CM | POA: Diagnosis not present

## 2021-10-27 DIAGNOSIS — I442 Atrioventricular block, complete: Secondary | ICD-10-CM | POA: Diagnosis present

## 2021-10-27 DIAGNOSIS — I130 Hypertensive heart and chronic kidney disease with heart failure and stage 1 through stage 4 chronic kidney disease, or unspecified chronic kidney disease: Principal | ICD-10-CM | POA: Diagnosis present

## 2021-10-27 DIAGNOSIS — Z950 Presence of cardiac pacemaker: Secondary | ICD-10-CM

## 2021-10-27 DIAGNOSIS — J811 Chronic pulmonary edema: Secondary | ICD-10-CM | POA: Diagnosis not present

## 2021-10-27 DIAGNOSIS — K219 Gastro-esophageal reflux disease without esophagitis: Secondary | ICD-10-CM | POA: Diagnosis present

## 2021-10-27 DIAGNOSIS — I25118 Atherosclerotic heart disease of native coronary artery with other forms of angina pectoris: Secondary | ICD-10-CM | POA: Diagnosis not present

## 2021-10-27 DIAGNOSIS — I5023 Acute on chronic systolic (congestive) heart failure: Secondary | ICD-10-CM

## 2021-10-27 DIAGNOSIS — N183 Chronic kidney disease, stage 3 unspecified: Secondary | ICD-10-CM | POA: Diagnosis not present

## 2021-10-27 DIAGNOSIS — R0600 Dyspnea, unspecified: Secondary | ICD-10-CM

## 2021-10-27 DIAGNOSIS — E1129 Type 2 diabetes mellitus with other diabetic kidney complication: Secondary | ICD-10-CM | POA: Diagnosis present

## 2021-10-27 DIAGNOSIS — E1122 Type 2 diabetes mellitus with diabetic chronic kidney disease: Secondary | ICD-10-CM | POA: Diagnosis present

## 2021-10-27 DIAGNOSIS — Z794 Long term (current) use of insulin: Secondary | ICD-10-CM

## 2021-10-27 DIAGNOSIS — Z6835 Body mass index (BMI) 35.0-35.9, adult: Secondary | ICD-10-CM

## 2021-10-27 DIAGNOSIS — Z8249 Family history of ischemic heart disease and other diseases of the circulatory system: Secondary | ICD-10-CM | POA: Diagnosis not present

## 2021-10-27 DIAGNOSIS — Z7902 Long term (current) use of antithrombotics/antiplatelets: Secondary | ICD-10-CM | POA: Diagnosis not present

## 2021-10-27 DIAGNOSIS — I2510 Atherosclerotic heart disease of native coronary artery without angina pectoris: Secondary | ICD-10-CM | POA: Diagnosis present

## 2021-10-27 DIAGNOSIS — I447 Left bundle-branch block, unspecified: Secondary | ICD-10-CM | POA: Diagnosis present

## 2021-10-27 DIAGNOSIS — I10 Essential (primary) hypertension: Secondary | ICD-10-CM | POA: Diagnosis not present

## 2021-10-27 DIAGNOSIS — I509 Heart failure, unspecified: Secondary | ICD-10-CM | POA: Insufficient documentation

## 2021-10-27 DIAGNOSIS — E876 Hypokalemia: Secondary | ICD-10-CM | POA: Diagnosis not present

## 2021-10-27 DIAGNOSIS — N1832 Chronic kidney disease, stage 3b: Secondary | ICD-10-CM | POA: Diagnosis present

## 2021-10-27 DIAGNOSIS — I248 Other forms of acute ischemic heart disease: Secondary | ICD-10-CM | POA: Diagnosis present

## 2021-10-27 DIAGNOSIS — Z79899 Other long term (current) drug therapy: Secondary | ICD-10-CM

## 2021-10-27 DIAGNOSIS — E785 Hyperlipidemia, unspecified: Secondary | ICD-10-CM | POA: Diagnosis present

## 2021-10-27 DIAGNOSIS — I441 Atrioventricular block, second degree: Secondary | ICD-10-CM | POA: Diagnosis present

## 2021-10-27 DIAGNOSIS — N1831 Chronic kidney disease, stage 3a: Secondary | ICD-10-CM | POA: Diagnosis present

## 2021-10-27 DIAGNOSIS — Z87891 Personal history of nicotine dependence: Secondary | ICD-10-CM

## 2021-10-27 DIAGNOSIS — I420 Dilated cardiomyopathy: Secondary | ICD-10-CM

## 2021-10-27 DIAGNOSIS — I453 Trifascicular block: Secondary | ICD-10-CM | POA: Diagnosis present

## 2021-10-27 DIAGNOSIS — I5043 Acute on chronic combined systolic (congestive) and diastolic (congestive) heart failure: Secondary | ICD-10-CM | POA: Diagnosis present

## 2021-10-27 DIAGNOSIS — Z888 Allergy status to other drugs, medicaments and biological substances status: Secondary | ICD-10-CM

## 2021-10-27 LAB — BASIC METABOLIC PANEL
Anion gap: 9 (ref 5–15)
BUN: 37 mg/dL — ABNORMAL HIGH (ref 8–23)
CO2: 27 mmol/L (ref 22–32)
Calcium: 8.4 mg/dL — ABNORMAL LOW (ref 8.9–10.3)
Chloride: 105 mmol/L (ref 98–111)
Creatinine, Ser: 1.74 mg/dL — ABNORMAL HIGH (ref 0.61–1.24)
GFR, Estimated: 38 mL/min — ABNORMAL LOW (ref >60)
Glucose, Bld: 192 mg/dL — ABNORMAL HIGH (ref 70–99)
Potassium: 3.3 mmol/L — ABNORMAL LOW (ref 3.5–5.1)
Sodium: 141 mmol/L (ref 135–145)

## 2021-10-27 LAB — RESP PANEL BY RT-PCR (FLU A&B, COVID) ARPGX2
Influenza A by PCR: NEGATIVE
Influenza B by PCR: NEGATIVE
SARS Coronavirus 2 by RT PCR: NEGATIVE

## 2021-10-27 LAB — CBC
HCT: 44.4 % (ref 39.0–52.0)
Hemoglobin: 13.4 g/dL (ref 13.0–17.0)
MCH: 26.7 pg (ref 26.0–34.0)
MCHC: 30.2 g/dL (ref 30.0–36.0)
MCV: 88.6 fL (ref 80.0–100.0)
Platelets: 255 10*3/uL (ref 150–400)
RBC: 5.01 MIL/uL (ref 4.22–5.81)
RDW: 15.5 % (ref 11.5–15.5)
WBC: 9.6 10*3/uL (ref 4.0–10.5)
nRBC: 0 % (ref 0.0–0.2)

## 2021-10-27 LAB — BRAIN NATRIURETIC PEPTIDE: B Natriuretic Peptide: 1801.4 pg/mL — ABNORMAL HIGH (ref 0.0–100.0)

## 2021-10-27 LAB — TROPONIN I (HIGH SENSITIVITY)
Troponin I (High Sensitivity): 31 ng/L — ABNORMAL HIGH (ref <18)
Troponin I (High Sensitivity): 32 ng/L — ABNORMAL HIGH (ref <18)

## 2021-10-27 LAB — GLUCOSE, CAPILLARY
Glucose-Capillary: 137 mg/dL — ABNORMAL HIGH (ref 70–99)
Glucose-Capillary: 120 mg/dL — ABNORMAL HIGH (ref 70–99)

## 2021-10-27 MED ORDER — CLOPIDOGREL BISULFATE 75 MG PO TABS
75.0000 mg | ORAL_TABLET | Freq: Every day | ORAL | Status: DC
  Administered 2021-10-27 – 2021-10-30 (×4): 75 mg via ORAL
  Filled 2021-10-27 (×4): qty 1

## 2021-10-27 MED ORDER — ISOSORBIDE MONONITRATE ER 30 MG PO TB24
30.0000 mg | ORAL_TABLET | Freq: Every day | ORAL | Status: DC
  Administered 2021-10-27 – 2021-10-30 (×4): 30 mg via ORAL
  Filled 2021-10-27 (×4): qty 1

## 2021-10-27 MED ORDER — ACYCLOVIR 200 MG PO CAPS
800.0000 mg | ORAL_CAPSULE | Freq: Every day | ORAL | Status: DC
  Administered 2021-10-27 – 2021-10-30 (×4): 800 mg via ORAL
  Filled 2021-10-27 (×4): qty 4

## 2021-10-27 MED ORDER — PANTOPRAZOLE SODIUM 40 MG PO TBEC
40.0000 mg | DELAYED_RELEASE_TABLET | Freq: Two times a day (BID) | ORAL | Status: DC
  Administered 2021-10-27 – 2021-10-30 (×7): 40 mg via ORAL
  Filled 2021-10-27 (×7): qty 1

## 2021-10-27 MED ORDER — SODIUM CHLORIDE 0.9% FLUSH: 3.0000 mL | INTRAVENOUS | Status: DC | PRN

## 2021-10-27 MED ORDER — FUROSEMIDE 10 MG/ML IJ SOLN
60.0000 mg | Freq: Two times a day (BID) | INTRAMUSCULAR | Status: DC
  Administered 2021-10-27 – 2021-10-30 (×6): 60 mg via INTRAVENOUS
  Filled 2021-10-27 (×3): qty 6
  Filled 2021-10-27: qty 8
  Filled 2021-10-27 (×2): qty 6

## 2021-10-27 MED ORDER — INSULIN ASPART 100 UNIT/ML IJ SOLN
0.0000 [IU] | Freq: Three times a day (TID) | INTRAMUSCULAR | Status: DC
  Administered 2021-10-28: 13:00:00 5 [IU] via SUBCUTANEOUS
  Administered 2021-10-29: 10:00:00 2 [IU] via SUBCUTANEOUS
  Administered 2021-10-29: 12:00:00 3 [IU] via SUBCUTANEOUS
  Administered 2021-10-29: 18:00:00 2 [IU] via SUBCUTANEOUS
  Administered 2021-10-30: 13:00:00 3 [IU] via SUBCUTANEOUS
  Filled 2021-10-27 (×5): qty 1

## 2021-10-27 MED ORDER — HYDRALAZINE HCL 25 MG PO TABS
25.0000 mg | ORAL_TABLET | Freq: Three times a day (TID) | ORAL | Status: DC
  Administered 2021-10-27 – 2021-10-30 (×10): 25 mg via ORAL
  Filled 2021-10-27 (×10): qty 1

## 2021-10-27 MED ORDER — TRAMADOL HCL 50 MG PO TABS: 50.0000 mg | ORAL_TABLET | Freq: Two times a day (BID) | ORAL | Status: DC | PRN

## 2021-10-27 MED ORDER — ALBUTEROL SULFATE (2.5 MG/3ML) 0.083% IN NEBU: 2.5000 mg | INHALATION_SOLUTION | Freq: Four times a day (QID) | RESPIRATORY_TRACT | Status: DC | PRN

## 2021-10-27 MED ORDER — INSULIN ASPART 100 UNIT/ML IJ SOLN
0.0000 [IU] | Freq: Every day | INTRAMUSCULAR | Status: DC
Start: 2021-10-27 — End: 2021-10-29

## 2021-10-27 MED ORDER — ENOXAPARIN SODIUM 40 MG/0.4ML IJ SOSY: 40.0000 mg | PREFILLED_SYRINGE | INTRAMUSCULAR | Status: DC

## 2021-10-27 MED ORDER — SODIUM CHLORIDE 0.9% FLUSH
3.0000 mL | Freq: Two times a day (BID) | INTRAVENOUS | Status: DC
  Administered 2021-10-27 – 2021-10-30 (×7): 3 mL via INTRAVENOUS

## 2021-10-27 MED ORDER — ROSUVASTATIN CALCIUM 10 MG PO TABS
40.0000 mg | ORAL_TABLET | Freq: Every day | ORAL | Status: DC
  Administered 2021-10-27 – 2021-10-30 (×4): 40 mg via ORAL
  Filled 2021-10-27 (×2): qty 4
  Filled 2021-10-27: qty 2
  Filled 2021-10-27: qty 4

## 2021-10-27 MED ORDER — INSULIN DETEMIR 100 UNIT/ML ~~LOC~~ SOLN
15.0000 [IU] | Freq: Every day | SUBCUTANEOUS | Status: DC
  Administered 2021-10-27 – 2021-10-28 (×2): 15 [IU] via SUBCUTANEOUS
  Filled 2021-10-27 (×3): qty 0.15

## 2021-10-27 MED ORDER — OXYBUTYNIN CHLORIDE 5 MG PO TABS
5.0000 mg | ORAL_TABLET | Freq: Two times a day (BID) | ORAL | Status: DC
  Administered 2021-10-27 – 2021-10-30 (×7): 5 mg via ORAL
  Filled 2021-10-27 (×9): qty 1

## 2021-10-27 MED ORDER — ONDANSETRON HCL 4 MG/2ML IJ SOLN: 4.0000 mg | Freq: Four times a day (QID) | INTRAMUSCULAR | Status: DC | PRN

## 2021-10-27 MED ORDER — NITROGLYCERIN 2 % TD OINT
0.5000 [in_us] | TOPICAL_OINTMENT | Freq: Once | TRANSDERMAL | Status: AC
  Administered 2021-10-27: 08:00:00 0.5 [in_us] via TOPICAL
  Filled 2021-10-27: qty 1

## 2021-10-27 MED ORDER — POTASSIUM CHLORIDE CRYS ER 20 MEQ PO TBCR
40.0000 meq | EXTENDED_RELEASE_TABLET | Freq: Every day | ORAL | Status: DC
  Administered 2021-10-27 – 2021-10-30 (×4): 40 meq via ORAL
  Filled 2021-10-27 (×4): qty 2

## 2021-10-27 MED ORDER — SODIUM CHLORIDE 0.9 % IV SOLN: 250.0000 mL | INTRAVENOUS | Status: DC | PRN

## 2021-10-27 MED ORDER — FUROSEMIDE 10 MG/ML IJ SOLN
80.0000 mg | Freq: Once | INTRAMUSCULAR | Status: AC
  Administered 2021-10-27: 08:00:00 80 mg via INTRAVENOUS
  Filled 2021-10-27: qty 8

## 2021-10-27 MED ORDER — ACETAMINOPHEN 325 MG PO TABS: 650.0000 mg | ORAL_TABLET | ORAL | Status: DC | PRN

## 2021-10-27 MED ORDER — ENOXAPARIN SODIUM 60 MG/0.6ML IJ SOSY
0.5000 mg/kg | PREFILLED_SYRINGE | INTRAMUSCULAR | Status: DC
  Administered 2021-10-27 – 2021-10-29 (×3): 52.5 mg via SUBCUTANEOUS
  Filled 2021-10-27 (×4): qty 0.53

## 2021-10-27 NOTE — ED Provider Notes (Signed)
[facility] Emergency Department Provider Note   ____________________________________________   Event Date/Time   First MD Initiated Contact with Patient 10/27/21 0715     (approximate)  I have reviewed the triage vital signs and the nursing notes.   HISTORY  Chief Complaint Shortness of Breath and Chest Pain   HPI [name] is a 86 y.o. male patient complained of chest pressure and shortness of breath.  Started last night.  EMS reports that shortness of breath worsened after albuterol.  Patient was short of breath and sweaty when he got here.  He is not currently.  He had pacemaker placed on the fifth of this month.  He is not currently having chest pressure.  He is not short of breath.        Past Medical History:  Diagnosis Date   AV block, [place] 1    CAD (coronary artery disease)    a. 08/2019 VF arrest-->sev 3vd on cath-->CABGx5 (LIMA->LAD, VG->OM->LCX, VG->RPDA, VG->Diag); b. 08/2021 Cath: sev native multivessel dzs w/ 5/5 patent grafts. Nl filling pressures->Med rx.   CHF (congestive heart failure) (HCC)    CKD (chronic kidney disease), stage III (HCC)    Diabetes mellitus without complication ([place])    HFrEF (heart failure with reduced ejection fraction) ([place])    a. 08/2020 Echo: EF 40-45%; b. 11/2020 Echo: EF 55%, no rwma, mild LVH, Gr2 DD. nl RV fxn; c. 08/2021 Echo: EF 35-40%, glob HK w/ ant/antsept/apical HK. Reduced RV fxn. Mildly dil LA.   Hyperlipidemia LDL goal <70    Hypertension    Intermittent Complete heart block ([place])    a. 08/2021 noted on Zio.   Ischemic cardiomyopathy    a. 08/2020 Echo: EF 40-45%; b. 11/2020 Echo: EF 55%; c. 08/2021 Echo: EF 35-40%.   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   CAD, multiple vessel 08/25/2020   Non-ST elevation (NSTEMI) myocardial infarction ([place]) 08/21/2020   History of cardiac arrest 08/21/2020   HFrEF (heart failure with reduced ejection fraction) ([place])    History of sustained ventricular fibrillation 08/18/2020   Acute respiratory failure with hypoxia (HCC)    Chronic diastolic heart failure (HCC)    Stage 3a chronic kidney disease ([place]) 11/20/2019   UTI (urinary tract infection) 07/26/2019   HTN (hypertension) 07/26/2019   Diabetes ([place]) 07/26/2019   Unstable angina ([place]) 12/03/2008   Status post percutaneous transluminal coronary angioplasty 10/08/2008   Malignant neoplasm of prostate ([place]) 10/05/2005   Essential hypertension 08/24/1989    Past Surgical History:  Procedure Laterality Date   BACK SURGERY     CARDIAC CATHETERIZATION     CLIPPING OF ATRIAL APPENDAGE N/A 08/29/2020   Procedure: CLIPPING OF ATRIAL APPENDAGE USING ATRICURE 53 MM ATRICLIP FLEX-V;  Surgeon: Grace Isaac, MD;  Location: [place];  Service: Open Heart Surgery;  Laterality: N/A;   CORONARY ARTERY BYPASS GRAFT N/A 08/29/2020   Procedure: CORONARY ARTERY BYPASS GRAFTING (CABG) X 5 USING LEFT INTERNAL MAMMARY ARTERY AND ENDOSCOPICALLY HARVESTED RIGHT GREATER SAPHENOUS  VEIN. LIMA TO LAD, SVG TO OM SEQ TO CIRC, SVG TO PD, SVG TO DIAG.;  Surgeon: Grace Isaac, MD;  Location: [place];  Service: Open Heart Surgery;  Laterality: N/A;   ENDOVEIN HARVEST OF GREATER SAPHENOUS VEIN Right 08/29/2020   Procedure: ENDOVEIN HARVEST OF GREATER SAPHENOUS VEIN;  Surgeon: Grace Isaac, MD;  Location: [place];  Service: Open Heart Surgery;  Laterality: Right;   HERNIA REPAIR     LEFT HEART CATH AND CORONARY ANGIOGRAPHY N/A 08/25/2020   Procedure: LEFT HEART CATH AND CORONARY  ANGIOGRAPHY;  Surgeon: [name], MD;  Location: [place] [facility];  Service: Cardiovascular;  Laterality: N/A;   PACEMAKER IMPLANT N/A 10/12/2021   Procedure: PACEMAKER IMPLANT;  Surgeon: Vickie Epley, MD;  Location: [place] [facility];  Service: Cardiovascular;  Laterality: N/A;   RIGHT/LEFT HEART CATH AND CORONARY ANGIOGRAPHY N/A 08/31/2021   Procedure: RIGHT/LEFT HEART CATH AND CORONARY ANGIOGRAPHY;  Surgeon: [name], MD;  Location: [place] [facility];  Service: Cardiovascular;  Laterality: N/A;   TEE WITHOUT CARDIOVERSION N/A 08/29/2020   Procedure: TRANSESOPHAGEAL ECHOCARDIOGRAM (TEE);  Surgeon: Grace Isaac, MD;  Location: [place];  Service: Open Heart Surgery;  Laterality: N/A;    Prior to Admission medications   Medication Sig Start Date End Date Taking? Authorizing Provider  acetaminophen (TYLENOL) 500 MG tablet Take 500-1,000 mg by mouth every 6 (six) hours as needed for mild pain or moderate pain.    Yes [provider]  acyclovir (ZOVIRAX) 800 MG tablet Take 800 mg by mouth daily. 12/02/20  Yes [provider]  albuterol (VENTOLIN HFA) 108 (90 Base) MCG/ACT inhaler Inhale 2 puffs into the lungs every 6 (six) hours as needed for wheezing or shortness of breath. 09/30/21  Yes [provider]  clopidogrel (PLAVIX) 75 MG tablet TAKE 1 TABLET BY MOUTH EVERY DAY 05/17/21  Yes Loel Dubonnet, NP  fluticasone (FLOVENT HFA) 44 MCG/ACT inhaler Inhale 2 puffs into the lungs in the morning and at bedtime.   Yes [provider]  furosemide (LASIX) 80 MG tablet Take 1 tablet (80 mg total) by mouth daily. 08/07/21  Yes Theora Gianotti, NP  hydrALAZINE (APRESOLINE) 25 MG tablet Take 1 tablet (25 mg total) by mouth 3 (three) times daily. 10/07/21  Yes Theora Gianotti, NP  insulin detemir (LEVEMIR) 100 UNIT/ML injection Inject 25 Units into the skin at bedtime.   Yes [provider]  losartan (COZAAR) 25 MG  tablet Take 25 mg by mouth daily.   Yes [provider]  magnesium oxide (MAG-OX) 400 MG tablet TAKE 1 TABLET BY MOUTH EVERY DAY 06/01/21  Yes [name], MD  Multiple Vitamin (MULTIVITAMIN WITH MINERALS) TABS tablet Take 1 tablet by mouth daily. 09/11/20  Yes Gold, Wayne E, PA-C  oxybutynin (DITROPAN) 5 MG tablet Take 5 mg by mouth 2 (two) times daily.   Yes [provider]  pantoprazole (PROTONIX) 40 MG tablet Take 40 mg by mouth 2 (two) times daily.   Yes [provider]  rosuvastatin (CRESTOR) 40 MG tablet Take 1 tablet (40 mg total) by mouth daily. 06/17/21  Yes [name], MD  traMADol (ULTRAM) 50 MG tablet Take 1 tablet (50 mg total) by mouth every 12 (twelve) hours as needed for severe pain. Further refill request will need to be sent to primary care. 02/27/21  Yes [name], MD  isosorbide mononitrate (IMDUR) 30 MG 24 hr tablet Take  1 tablet (30 mg total) by mouth daily. 09/02/21 10/06/21  Wyvonnia Dusky, MD    Allergies Angiotensin receptor blockers, Metformin, and Spironolactone  Family History  Problem Relation Age of Onset   Heart attack Mother     Social History Social History   Tobacco Use   Smoking status: Former    Types: Cigarettes    Quit date: 07/26/1996    Years since quitting: 25.2   Smokeless tobacco: Never  Vaping Use   Vaping Use: Never used  Substance Use Topics   Alcohol use: Not Currently   Drug use: Never    Review of Systems  Constitutional: No fever/chills Eyes: No visual changes. ENT: No sore throat. Cardiovascular: Denies chest pain. Respiratory: shortness of breath. Gastrointestinal: No abdominal pain.  No nausea, no vomiting.  No diarrhea.  No constipation. Genitourinary: Negative for dysuria. Musculoskeletal: Negative for back pain. Skin: Negative for rash. Neurological: Negative for headaches, focal weakness   ____________________________________________   PHYSICAL EXAM:  VITAL  SIGNS: ED Triage Vitals  Enc Vitals Group     BP 10/27/21 0648 (!) 162/122     Pulse Rate 10/27/21 0648 79     Resp 10/27/21 0648 (!) 28     Temp 10/27/21 0654 98.4 F (36.9 C)     Temp Source 10/27/21 0648 Oral     SpO2 10/27/21 0648 95 %     Weight 10/27/21 0649 230 lb (104.3 kg)     Height --      Head Circumference --      Peak Flow --      Pain Score 10/27/21 0649 8     Pain Loc --      Pain Edu? --      Excl. in [place]? --     Constitutional: Alert and oriented.  Looks ill and short of breath Eyes: Conjunctivae are normal.  Head: Atraumatic. Nose: No congestion/rhinnorhea. Mouth/Throat: Mucous membranes are moist.  Oropharynx non-erythematous. Neck: No stridor.   Cardiovascular: Normal rate, irregular rhythm. Grossly normal heart sounds.  Good peripheral circulation. Respiratory: Normal respiratory effort.  No retractions. Lungs diffuse crackles and decreased breath sounds Gastrointestinal: Soft and nontender. No distention. No abdominal bruits.  Musculoskeletal: No lower extremity tenderness bilateral 2+ edema Neurologic:  Normal speech and language. No gross focal neurologic deficits are appreciated. . Skin:  Skin is warm, dry and intact. No rash noted.   ____________________________________________   LABS (all labs ordered are listed, but only abnormal results are displayed)  Labs Reviewed  BASIC METABOLIC PANEL - Abnormal; Notable for the following components:      Result Value   Potassium 3.3 (*)    Glucose, Bld 192 (*)    BUN 37 (*)    Creatinine, Ser 1.74 (*)    Calcium 8.4 (*)    GFR, Estimated 38 (*)    All other components within normal limits  BRAIN NATRIURETIC PEPTIDE - Abnormal; Notable for the following components:   B Natriuretic Peptide 1,801.4 (*)    All other components within normal limits  TROPONIN I (HIGH SENSITIVITY) - Abnormal; Notable for the following components:   Troponin I (High Sensitivity) 31 (*)    All other components within  normal limits  TROPONIN I (HIGH SENSITIVITY) - Abnormal; Notable for the following components:   Troponin I (High Sensitivity) 32 (*)    All other components within normal limits  RESP PANEL BY RT-PCR (FLU A&B, COVID) ARPGX2  CBC   ____________________________________________  EKG  EKG read interpreted by me shows normal sinus rhythm rate of 65 occasional PVCs.  PVCs probably account for the perceived it irregularity in the patient's rhythm when I checked it.  Patient has pacemaker spikes on the EKG done by EMS but none on the EKG that I am looking at currently.  Patient has atypical left bundle branch block.  Flipped T's in 1 and L which are old.  Flipped T's in V5 and V6 which are old.  He also has now on this EKG flipped T's in lead II and lead 4 V4 which are not new. ____________________________________________  RADIOLOGY Gertha Calkin, personally viewed and evaluated these images (plain radiographs) as part of my medical decision making, as well as reviewing the written report by the radiologist.  ED MD interpretation: Enlarged heart and increased vessel markings consistent with CHF  Official radiology report(s): DG Chest 2 View  Result Date: 10/27/2021 CLINICAL DATA:  Chest pain and shortness of breath. EXAM: CHEST - 2 VIEW COMPARISON:  10/13/2021 FINDINGS: The cardio pericardial silhouette is enlarged. There is pulmonary vascular congestion without overt pulmonary edema. Atelectasis/scarring noted at the bases. Calcification overlying the right mid lung compatible with patient's known calcified right subscapular mass. Left-sided permanent pacemaker. Telemetry leads overlie the chest. IMPRESSION: Enlarged cardiopericardial silhouette with pulmonary vascular congestion. Electronically Signed   By: Misty Stanley M.D.   On: 10/27/2021 07:43    ____________________________________________   PROCEDURES  Procedure(s) performed (including Critical Care): Critical care time 20  minutes.  This includes reviewing the patient's old records and the patient's studies chest x-ray EKG etc.  After compared EKG to old EKG.  I discussed the patient with himself and his wife who is now here.  Patient does not use oxygen at home.  He still requiring 3 L to keep his sats up.  Procedures   ____________________________________________   INITIAL IMPRESSION / ASSESSMENT AND PLAN / ED COURSE ----------------------------------------- 10:29 AM on 10/27/2021 -----------------------------------------   Patient with onset of congestive heart failure.  He has a history of this.  However could have been exacerbated by not only diet but possibly some additional heart injury.  His troponins higher than normal.  We will have to rule him out as well as to treat his congestive heart failure and ensure he loses enough fluid that he can go home off of oxygen which is what his usual state is.  I have given him 80 of Lasix IV and given him a half an inch of Nitropaste.  His systolic blood pressure right now is 129.  Possibly we can give him some more Nitropaste.  I will call the hospital doctor and discussed this with him.         ____________________________________________   FINAL CLINICAL IMPRESSION(S) / ED DIAGNOSES  Final diagnoses:  Dyspnea, unspecified type  Acute on chronic congestive heart failure, unspecified heart failure type [facility])     ED Discharge Orders     None        Note:  This document was prepared using Dragon voice recognition software and may include unintentional dictation errors.    Nena Polio, MD 10/27/21 [phone]
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
## 2021-10-27 NOTE — Evaluation (Signed)
Occupational Therapy Evaluation Patient Details Name: Robertlee Rogacki MRN: [id number] DOB: 1935-03-15 Today's Date: 10/27/2021   History of Present Illness 86 year old male with a history of ventricular fibrillation arrest, CAD status post CABG x5 in October 2021, ischemic cardiomyopathy, heart failure with reduced ejection fraction, hypertension, hyperlipidemia, diabetes, obesity, and stage III chronic kidney disease, who presented to [facility] complaining of chest pressure and shortness of breath.Pt admitted for acute on chronic combined systolic and diastolic congestive heart failure/ischemic cardiomyopathy   Clinical Impression   Pt seen for OT evaluation this date. Upon arrival to room, pt awake and seated upright in bed on 3L of O2. Pt A&Ox4 and wife present throughout session. At baseline, pt is independent in all ADLs and functional mobility, using a SPC for navigating stairs only. Prior to admission, pt was living in a 1-story home with wife. Pt does not use supplemental O2 at baseline. Pt currently requires SUPERVISION for bed mobility, MIN GUARD for functional mobility of short household distances (~53ft) without AD, SUPERVISION for BSC transfers, and SUPERVISION for standing hand hygiene d/t decreased activity tolerance and balance. Of note, pt trialed on RA during session, with SpO2 >93% throughout and pt left in bed on RA with SpO2 98%; RN informed. Pt educated in energy conservation strategies including pursed lip breathing, activity pacing, and monitoring energy via RPE scale. Pt verbalized understanding and would benefit from additional skilled OT services to maximize recall and carryover of energy conservation strategies and to facilitate implementation of learned techniques into daily routines. Upon discharge, recommend no OT follow up.   Recommendations for follow up therapy are one component of a multi-disciplinary discharge planning process, led by the attending physician.  Recommendations  may be updated based on patient status, additional functional criteria and insurance authorization.   Follow Up Recommendations  No OT follow up    Assistance Recommended at Discharge Intermittent Supervision/Assistance  Functional Status Assessment  Patient has had a recent decline in their functional status and demonstrates the ability to make significant improvements in function in a reasonable and predictable amount of time.  Equipment Recommendations  None recommended by OT       Precautions / Restrictions Precautions Precautions: Fall Restrictions Weight Bearing Restrictions: No      Mobility Bed Mobility Overal bed mobility: Needs Assistance Bed Mobility: Supine to Sit;Sit to Supine     Supine to sit: Supervision;HOB elevated Sit to supine: Supervision;HOB elevated   General bed mobility comments: With momentum, pt requires no physical assist    Transfers Overall transfer level: Needs assistance Equipment used: None Transfers: Sit to/from Stand Sit to Stand: Supervision                  Balance Overall balance assessment: Needs assistance Sitting-balance support: No upper extremity supported;Feet unsupported Sitting balance-[name]: Good Sitting balance - Comments: Good sitting balance donning socks at EOB   Standing balance support: No upper extremity supported;During functional activity Standing balance-[name]: Fair Standing balance comment: Requires MIN GUARD for functional mobility of household distances without AD                           ADL either performed or assessed with clinical judgement   ADL Overall ADL's : Needs assistance/impaired     Grooming: Wash/dry hands;Supervision/safety;Standing               Lower Body Dressing: Min guard;Sitting/lateral leans Lower Body Dressing Details (indicate cue type and  reason): to don socks while sitting EOB Toilet Transfer: Supervision/safety;Ambulation;BSC/3in1            Functional mobility during ADLs: Min guard (to walk 16ft without AD)       Vision Baseline Vision/History: 1 Wears glasses Ability to See in Adequate Light: 0 Adequate Patient Visual Report: No change from baseline Vision Assessment?: No apparent visual deficits            Pertinent Vitals/Pain Pain Assessment: No/denies pain     Hand Dominance Right   Extremity/Trunk Assessment Upper Extremity Assessment Upper Extremity Assessment: Overall WFL for tasks assessed   Lower Extremity Assessment Lower Extremity Assessment: Generalized weakness   Cervical / Trunk Assessment Cervical / Trunk Assessment: Normal   Communication Communication Communication: HOH   Cognition Arousal/Alertness: Awake/alert Behavior During Therapy: WFL for tasks assessed/performed Overall Cognitive Status: Within Functional Limits for tasks assessed                                 General Comments: Pt A&Ox4.     General Comments  While on RA, SpO2> 93% throughout session. HR 100-110bpm during OOB activity    Exercises Other Exercises Other Exercises: Pt educated on role of OT, POC, and d/c recommendations, with pt and wife verbalizing understanding Other Exercises: Pt educated on energy conservation strategies including activity pacing, activity positioning, PLB, and RPE scale, with pt and wife verbalizing understanding        Home Living Family/patient expects to be discharged to:: Private residence Living Arrangements: Spouse/significant other Available Help at Discharge: Family;Available 24 hours/day Type of Home: House Home Access: Stairs to enter [name] of Steps: 5 Entrance Stairs-Rails: None Home Layout: One level     Bathroom Shower/Tub: Occupational psychologist: Standard     Home Equipment: Cane - single Barista (2 wheels)          Prior Functioning/Environment Prior Level of Function : Independent/Modified  Independent             Mobility Comments: Uses SPC for navigating stairs. No AD otherwise ADLs Comments: Independent with ADLs. Wife assists with IADLs        OT Problem List: Decreased strength;Decreased activity tolerance;Impaired balance (sitting and/or standing)      OT Treatment/Interventions: Self-care/ADL training;Therapeutic exercise;Neuromuscular education;Energy conservation;DME and/or AE instruction;Therapeutic activities;Patient/family education;Balance training    OT Goals(Current goals can be found in the care plan section) Acute Rehab OT Goals Patient Stated Goal: to go home OT Goal Formulation: With patient Time For Goal Achievement: 11/10/21 Potential to Achieve Goals: Good  OT Frequency: Min 2X/week    AM-PAC OT "6 Clicks" Daily Activity     Outcome Measure Help from another person eating meals?: None Help from another person taking care of personal grooming?: A Little Help from another person toileting, which includes using toliet, bedpan, or urinal?: A Little Help from another person bathing (including washing, rinsing, drying)?: A Little Help from another person to put on and taking off regular upper body clothing?: None Help from another person to put on and taking off regular lower body clothing?: A Little 6 Click Score: 20   End of Session Equipment Utilized During Treatment: Gait belt;Oxygen Nurse Communication: Mobility status;Other (comment) (SpO2 during mobility)  Activity Tolerance: Patient tolerated treatment well Patient left: in bed;with call bell/phone within reach;with family/visitor present  OT Visit Diagnosis: Unsteadiness on feet (R26.81);Muscle weakness (generalized) (M62.81)  Time: 1340-1409 OT Time Calculation (min): 29 min Charges:  OT General Charges $OT Visit: 1 Visit OT Evaluation $OT Eval Moderate Complexity: 1 Mod OT Treatments $Self Care/Home Management : 8-22 mins  Fredirick Maudlin, OTR/L [place]

## 2021-10-27 NOTE — ED Triage Notes (Addendum)
Pt comes into the ED via EMS from home with c/o chest pressure since last night,  Given ASA 324mg  1 NItro spray ALbuterol x1 SOB worsened after albuterol, pt arrives in respiratory distress and diaphoretic, unable to speak in complete sentence on 3L [place] at 95%RA Pacemake placed 10/12/2021

## 2021-10-27 NOTE — Progress Notes (Signed)
PHARMACIST - PHYSICIAN COMMUNICATION  CONCERNING:  Enoxaparin (Lovenox) for DVT Prophylaxis    RECOMMENDATION: Patient was prescribed enoxaprin 40mg  q24 hours for VTE prophylaxis.   Filed Weights   10/27/21 0649  Weight: 104.3 kg (230 lb)    Body mass index is 34.97 kg/m.  Estimated Creatinine Clearance: 37 mL/min (A) (by C-G formula based on SCr of 1.74 mg/dL (H)).   Based on [employer] patient is candidate for enoxaparin 0.5mg /kg TBW SQ every 24 hours based on BMI being >30.  DESCRIPTION: Pharmacy has adjusted enoxaparin dose per [facility] policy.  Patient is now receiving enoxaparin 52.5 mg every 24 hours    Pernell Dupre, PharmD Clinical Pharmacist  10/27/2021 11:51 AM

## 2021-10-27 NOTE — H&P (Signed)
History and Physical    [name] GYF:[id number] DOB: Dec 10, 1934 DOA: 10/27/2021  PCP: Tomasita Morrow, MD  Patient coming from: Home  I have personally briefly reviewed patient's old medical records in [place]  Chief Complaint: Shortness of breath  HPI: [name] is a 86 y.o. male with medical history significant of VF arrest, CAD status post CABG x5 in October 2021, ischemic cardiomyopathy EF 25 to 30%, hypertension, hyperlipidemia, high-grade heart block status post CRT-P, stage IIIb chronic kidney disease who presents to the ED for evaluation of chest pain or shortness of breath.  Patient is well-known to [facility] cardiology service.  They have been made aware and consultation has been requested.  Patient has known nonadherence to his home diuretic regiment with frequent readmissions for similar complaints.  On arrival patient had signs and symptoms consistent with fluid overload including pulmonary edema noted on chest x-ray, bilateral lower extremity edema, elevated BNP.  Received IV Lasix 80 mg x 1 and hospitalist contacted for admission.    ED Course: Initial vital signs stable.  Patient required small amount of oxygen.  Received 80 mg IV Lasix x1.  Hospitalist contacted for admission  Review of Systems: As per HPI otherwise 14 point review of systems negative.    Past Medical History:  Diagnosis Date   AV block, Mobitz 1    CAD (coronary artery disease)    a. 08/2019 VF arrest-->sev 3vd on cath-->CABGx5 (LIMA->LAD, VG->OM->LCX, VG->RPDA, VG->Diag); b. 08/2021 Cath: sev native multivessel dzs w/ 5/5 patent grafts. Nl filling pressures->Med rx.   CHF (congestive heart failure) (HCC)    CKD (chronic kidney disease), stage III (HCC)    Diabetes mellitus without complication ([place])    HFrEF (heart failure with reduced ejection fraction) ([place])    a. 08/2020 Echo: EF 40-45%; b. 11/2020 Echo: EF 55%, no rwma, mild LVH, Gr2 DD. nl RV fxn; c. 08/2021 Echo: EF 35-40%,  glob HK w/ ant/antsept/apical HK. Reduced RV fxn. Mildly dil LA.   Hyperlipidemia LDL goal <70    Hypertension    Intermittent Complete heart block ([place])    a. 08/2021 noted on Zio.   Ischemic cardiomyopathy    a. 08/2020 Echo: EF 40-45%; b. 11/2020 Echo: EF 55%; c. 08/2021 Echo: EF 35-40%.   Trifascicular block     Past Surgical History:  Procedure Laterality Date   BACK SURGERY     CARDIAC CATHETERIZATION     CLIPPING OF ATRIAL APPENDAGE N/A 08/29/2020   Procedure: CLIPPING OF ATRIAL APPENDAGE USING ATRICURE 16 MM ATRICLIP FLEX-V;  Surgeon: Grace Isaac, MD;  Location: [place];  Service: Open Heart Surgery;  Laterality: N/A;   CORONARY ARTERY BYPASS GRAFT N/A 08/29/2020   Procedure: CORONARY ARTERY BYPASS GRAFTING (CABG) X 5 USING LEFT INTERNAL MAMMARY ARTERY AND ENDOSCOPICALLY HARVESTED RIGHT GREATER SAPHENOUS VEIN. LIMA TO LAD, SVG TO OM SEQ TO CIRC, SVG TO PD, SVG TO DIAG.;  Surgeon: Grace Isaac, MD;  Location: [place];  Service: Open Heart Surgery;  Laterality: N/A;   ENDOVEIN HARVEST OF GREATER SAPHENOUS VEIN Right 08/29/2020   Procedure: ENDOVEIN HARVEST OF GREATER SAPHENOUS VEIN;  Surgeon: Grace Isaac, MD;  Location: [place];  Service: Open Heart Surgery;  Laterality: Right;   HERNIA REPAIR     LEFT HEART CATH AND CORONARY ANGIOGRAPHY N/A 08/25/2020   Procedure: LEFT HEART CATH AND CORONARY ANGIOGRAPHY;  Surgeon: [name], MD;  Location: [place] CV LAB;  Service: Cardiovascular;  Laterality: N/A;  PACEMAKER IMPLANT N/A 10/12/2021   Procedure: PACEMAKER IMPLANT;  Surgeon: Vickie Epley, MD;  Location: [place] [facility];  Service: Cardiovascular;  Laterality: N/A;   RIGHT/LEFT HEART CATH AND CORONARY ANGIOGRAPHY N/A 08/31/2021   Procedure: RIGHT/LEFT HEART CATH AND CORONARY ANGIOGRAPHY;  Surgeon: [name], MD;  Location: [place] [facility];  Service: Cardiovascular;  Laterality: N/A;   TEE WITHOUT CARDIOVERSION N/A 08/29/2020    Procedure: TRANSESOPHAGEAL ECHOCARDIOGRAM (TEE);  Surgeon: Grace Isaac, MD;  Location: [place];  Service: Open Heart Surgery;  Laterality: N/A;     reports that he quit smoking about 25 years ago. His smoking use included cigarettes. He has never used smokeless tobacco. He reports that he does not currently use alcohol. He reports that he does not use drugs.  Allergies  Allergen Reactions   Angiotensin Receptor Blockers     hyperkalemia   Metformin Diarrhea   Spironolactone     Hyperkalemia     Family History  Problem Relation Age of Onset   Heart attack Mother      Prior to Admission medications   Medication Sig Start Date End Date Taking? Authorizing Provider  acetaminophen (TYLENOL) 500 MG tablet Take 500-1,000 mg by mouth every 6 (six) hours as needed for mild pain or moderate pain.    Yes [provider]  acyclovir (ZOVIRAX) 800 MG tablet Take 800 mg by mouth daily. 12/02/20  Yes [provider]  albuterol (VENTOLIN HFA) 108 (90 Base) MCG/ACT inhaler Inhale 2 puffs into the lungs every 6 (six) hours as needed for wheezing or shortness of breath. 09/30/21  Yes [provider]  clopidogrel (PLAVIX) 75 MG tablet TAKE 1 TABLET BY MOUTH EVERY DAY 05/17/21  Yes Loel Dubonnet, NP  fluticasone (FLOVENT HFA) 44 MCG/ACT inhaler Inhale 2 puffs into the lungs in the morning and at bedtime.   Yes [provider]  furosemide (LASIX) 80 MG tablet Take 1 tablet (80 mg total) by mouth daily. 08/07/21  Yes Theora Gianotti, NP  hydrALAZINE (APRESOLINE) 25 MG tablet Take 1 tablet (25 mg total) by mouth 3 (three) times daily. 10/07/21  Yes Theora Gianotti, NP  insulin detemir (LEVEMIR) 100 UNIT/ML injection Inject 25 Units into the skin at bedtime.   Yes [provider]  losartan (COZAAR) 25 MG tablet Take 25 mg by mouth daily.   Yes [provider]  magnesium oxide (MAG-OX) 400 MG tablet TAKE 1 TABLET BY MOUTH EVERY DAY  06/01/21  Yes [name], MD  Multiple Vitamin (MULTIVITAMIN WITH MINERALS) TABS tablet Take 1 tablet by mouth daily. 09/11/20  Yes Gold, Wayne E, PA-C  oxybutynin (DITROPAN) 5 MG tablet Take 5 mg by mouth 2 (two) times daily.   Yes [provider]  pantoprazole (PROTONIX) 40 MG tablet Take 40 mg by mouth 2 (two) times daily.   Yes [provider]  rosuvastatin (CRESTOR) 40 MG tablet Take 1 tablet (40 mg total) by mouth daily. 06/17/21  Yes [name], MD  traMADol (ULTRAM) 50 MG tablet Take 1 tablet (50 mg total) by mouth every 12 (twelve) hours as needed for severe pain. Further refill request will need to be sent to primary care. 02/27/21  Yes [name], MD  isosorbide mononitrate (IMDUR) 30 MG 24 hr tablet Take 1 tablet (30 mg total) by mouth daily. 09/02/21 10/06/21  Wyvonnia Dusky, MD    Physical Exam: Vitals:   10/27/21 1030 10/27/21 1045 10/27/21 1100  10/27/21 1115  BP: (!) 148/75 132/75 140/81 126/78  Pulse: 61 (!) 59 60 65  Resp: (!) 22 (!) 22 (!) 21 12  Temp:      TempSrc:      SpO2: 98% 97% 97% 98%  Weight:         Vitals:   10/27/21 1030 10/27/21 1045 10/27/21 1100 10/27/21 1115  BP: (!) 148/75 132/75 140/81 126/78  Pulse: 61 (!) 59 60 65  Resp: (!) 22 (!) 22 (!) 21 12  Temp:      TempSrc:      SpO2: 98% 97% 97% 98%  Weight:      Constitutional: Resting in bed.  Appears chronically ill.  No apparent distress ENMT: Mucous membranes are dry. Posterior pharynx clear of any exudate or lesions.poor dentition.  Respiratory: Scattered crackles bilaterally.  Normal work of breathing.  3 L Cardiovascular: S1-S2, regular rate and rhythm, no murmurs, 2+ pitting edema Abdomen: Obese, NT/ND, normal bowel sounds Musculoskeletal: No clubbing or cyanosis.  No obvious joint deformity.  Good range of motion.  Normal muscle tone Skin: no rashes, lesions, ulcers. No induration Neurologic: Cranial nerves grossly intact.  Sensation  intact Psychiatric: Normal judgment and insight. Alert and oriented x 3. Normal mood.    Labs on Admission: I have personally reviewed following labs and imaging studies  CBC: Recent Labs  Lab 10/27/21 0658  WBC 9.6  HGB 13.4  HCT 44.4  MCV 88.6  PLT 332   Basic Metabolic Panel: Recent Labs  Lab 10/27/21 0658  NA 141  K 3.3*  CL 105  CO2 27  GLUCOSE 192*  BUN 37*  CREATININE 1.74*  CALCIUM 8.4*   GFR: Estimated Creatinine Clearance: 37 mL/min (A) (by C-G formula based on SCr of 1.74 mg/dL (H)). Liver Function Tests: No results for input(s): AST, ALT, ALKPHOS, BILITOT, PROT, ALBUMIN in the last 168 hours. No results for input(s): LIPASE, AMYLASE in the last 168 hours. No results for input(s): AMMONIA in the last 168 hours. Coagulation Profile: No results for input(s): INR, PROTIME in the last 168 hours. Cardiac Enzymes: No results for input(s): CKTOTAL, CKMB, CKMBINDEX, TROPONINI in the last 168 hours. BNP (last 3 results) No results for input(s): PROBNP in the last 8760 hours. HbA1C: No results for input(s): HGBA1C in the last 72 hours. CBG: No results for input(s): GLUCAP in the last 168 hours. Lipid Profile: No results for input(s): CHOL, HDL, LDLCALC, TRIG, CHOLHDL, LDLDIRECT in the last 72 hours. Thyroid Function Tests: No results for input(s): TSH, T4TOTAL, FREET4, T3FREE, THYROIDAB in the last 72 hours. Anemia Panel: No results for input(s): VITAMINB12, FOLATE, FERRITIN, TIBC, IRON, RETICCTPCT in the last 72 hours. Urine analysis:    Component Value Date/Time   COLORURINE YELLOW 09/01/2020 2035   APPEARANCEUR HAZY (A) 09/01/2020 2035   LABSPEC 1.010 09/01/2020 2035   PHURINE 5.0 09/01/2020 2035   GLUCOSEU NEGATIVE 09/01/2020 2035   HGBUR MODERATE (A) 09/01/2020 2035   BILIRUBINUR NEGATIVE 09/01/2020 2035   KETONESUR NEGATIVE 09/01/2020 2035   PROTEINUR NEGATIVE 09/01/2020 2035   NITRITE NEGATIVE 09/01/2020 2035   LEUKOCYTESUR TRACE (A) 09/01/2020  2035    Radiological Exams on Admission: DG Chest 2 View  Result Date: 10/27/2021 CLINICAL DATA:  Chest pain and shortness of breath. EXAM: CHEST - 2 VIEW COMPARISON:  10/13/2021 FINDINGS: The cardio pericardial silhouette is enlarged. There is pulmonary vascular congestion without overt pulmonary edema. Atelectasis/scarring noted at the bases. Calcification overlying the right mid lung compatible with  patient's known calcified right subscapular mass. Left-sided permanent pacemaker. Telemetry leads overlie the chest. IMPRESSION: Enlarged cardiopericardial silhouette with pulmonary vascular congestion. Electronically Signed   By: Misty Stanley M.D.   On: 10/27/2021 07:43    EKG: Independently reviewed.  AV paced.  Ventricular rate 65  Assessment/Plan Principal Problem:   Acute decompensated heart failure (HCC)  Acute on chronic systolic and diastolic congestive heart failure Ischemic cardiomyopathy Troponin elevation EF 35 to 40% in October Presented with acute onset dyspnea Reports stable weights and compliance with Lasix Denies to dietary indiscretion Elevated BNP, chest x-ray pulmonary vascular congestion Mild troponin elevation Plan: Admit to cardiac telemetry IV diuresis 60 mg Lasix twice daily Continue hydralazine and nitrates Monitor blood pressure Monitor kidney function Consider addition of Coreg in the next 24 hours  Coronary artery disease Elevated troponin CAD status post CABG x5 Recent catheterization October 2022 Graft patent Minimal troponin elevation Suspect supply demand ischemia Plan: Continue nitrates Continue antiplatelets High intensity statin  Trifascicular block/intermittent complete heart block Status post CRT-P Outpatient PCP/EP follow-up  Central hypertension Currently stable Follow-up on IV diuretics  Hyperlipidemia PTA statin  Stage IIIb chronic kidney disease Relatively stable Monitor while on diuretics  Hypokalemia Monitor and  supplement as necessary  Diabetes mellitus Last A1c 7.1 Plan: Lantus 15 units at bedtime (home dose 25 units Moderate sliding scale Nightly sliding-scale coverage Carb modified diet  Bladder spasms PTA oxybutynin  GERD PTA PPI  Obesity This complicates overall care and prognosis  History of zoster PTA Zovirax    DVT prophylaxis: Lovenox Code Status: Full Family Communication: Wife at bedside 12/20 Disposition Plan: Anticipate return to previous home environment Consults called: [facility] Admission status: Inpatient, cardiac telemetry   Sidney Ace MD [facility]   If 7PM-7AM, please contact night-coverage   10/27/2021, 11:42 AM

## 2021-10-28 DIAGNOSIS — I509 Heart failure, unspecified: Secondary | ICD-10-CM

## 2021-10-28 DIAGNOSIS — I420 Dilated cardiomyopathy: Secondary | ICD-10-CM

## 2021-10-28 DIAGNOSIS — I25118 Atherosclerotic heart disease of native coronary artery with other forms of angina pectoris: Secondary | ICD-10-CM

## 2021-10-28 DIAGNOSIS — J811 Chronic pulmonary edema: Secondary | ICD-10-CM

## 2021-10-28 LAB — BASIC METABOLIC PANEL
Anion gap: 7 (ref 5–15)
BUN: 35 mg/dL — ABNORMAL HIGH (ref 8–23)
CO2: 29 mmol/L (ref 22–32)
Calcium: 8.2 mg/dL — ABNORMAL LOW (ref 8.9–10.3)
Chloride: 106 mmol/L (ref 98–111)
Creatinine, Ser: 1.79 mg/dL — ABNORMAL HIGH (ref 0.61–1.24)
GFR, Estimated: 37 mL/min — ABNORMAL LOW (ref >60)
Glucose, Bld: 73 mg/dL (ref 70–99)
Potassium: 3.6 mmol/L (ref 3.5–5.1)
Sodium: 142 mmol/L (ref 135–145)

## 2021-10-28 LAB — GLUCOSE, CAPILLARY
Glucose-Capillary: 77 mg/dL (ref 70–99)
Glucose-Capillary: 206 mg/dL — ABNORMAL HIGH (ref 70–99)
Glucose-Capillary: 64 mg/dL — ABNORMAL LOW (ref 70–99)
Glucose-Capillary: 70 mg/dL (ref 70–99)
Glucose-Capillary: 154 mg/dL — ABNORMAL HIGH (ref 70–99)

## 2021-10-28 MED ORDER — ACETAMINOPHEN 325 MG PO TABS: 650.0000 mg | ORAL_TABLET | ORAL | Status: DC | PRN

## 2021-10-28 NOTE — Consult Note (Addendum)
° °  Heart Failure Nurse Navigator Note  HFrEF 35 to 40%  He presented to the emergency room with complaints of increasing shortness of breath, lower extremity edema, elevated BNP and chest x-ray revealed pulmonary edema.  Comorbidities:  Coronary artery disease with coronary artery bypass grafting Hypertension Hyperlipidemia Chronic kidney disease stage III  Recent insertion of CRT-P for high-grade block.  Medication:  Plavix 75 mg daily Furosemide 60 mg IV every 12 Hydralazine 25 mg 3 times a day and Isosorbide mononitrate 30 mg daily Potassium chloride 40 mEq daily Crestor 40 mg daily  Per cardiology's note there considering adding metolazone to be used twice a week along with consideration for an SGLT2 inhibitor.  He had problems with hyperkalemia with taking spironolactone.   Labs:  Sodium 142, potassium 3.6, chloride 106, CO2 29, BUN 35, creatinine 1.79, BNP on admission 1801. Weight is 105.3 kg Blood pressure 142/83 Intake 240 mL Output 2900 mL   Initial meeting with patient today, he is lying in bed on room air in no acute distress.  States that he is feeling better.  His wife was not present.  Discussed how he takes care of himself at home.  He states that he does weigh himself daily, recommended recording daily weight and reporting a 2 to 3 pound weight gain overnight or 5 pounds within the week.  Also to report to care provider changes in symptoms such as increased abdominal girth, lower extremity edema, increased shortness of breath, PND and orthopnea.  Also talked about being compliant with his meds he states that his wife sets up his medications and that she is very strict to make sure that he takes them as ordered.  Also talked about fluid restriction, he he listed that he drinks 1 cup of coffee in the morning and throughout the day has 216 ounce bottles of water.  Denied drinking tea, juices or sodas.  Also discussed low-sodium diet, he states that they eat  at restaurants very seldom.  Recommended not using salt at the table but using natural spices or Mrs. Dash or to use lemon juice or vinegar.  He voices understanding.  He had no further questions.  He has follow-up in the outpatient heart failure clinic on November 17, 2021 at 11 AM.  He has a 13% no-show which is 11 out of 88 appointments.  Pricilla Riffle RN [facility]

## 2021-10-28 NOTE — Progress Notes (Signed)
PROGRESS NOTE    [name]  RJJ:[id number] DOB: [birth year] DOA: 10/27/2021 PCP: Tomasita Morrow, MD  [phone]   Assessment & Plan:   Principal Problem:   Acute decompensated heart failure (HCC)   [name] is a 86 y.o. male with medical history significant of VF arrest, CAD status post CABG x5 in October 2021, ischemic cardiomyopathy EF 25 to 30%, hypertension, hyperlipidemia, high-grade heart block status post CRT-P, stage IIIb chronic kidney disease who presents to the ED for evaluation of chest pain or shortness of breath.   Patient is well-known to [facility] cardiology service.  They have been made aware and consultation has been requested.  Patient has known nonadherence to his home diuretic regiment with frequent readmissions for similar complaints.   On arrival patient had signs and symptoms consistent with fluid overload including pulmonary edema noted on chest x-ray, bilateral lower extremity edema, elevated BNP.  Received IV Lasix 80 mg x 1 and hospitalist contacted for admission.   Acute on chronic systolic and diastolic congestive heart failure Ischemic cardiomyopathy EF 35 to 40% in October Presented with acute onset dyspnea BNP is elevated at 1881.   Chest x-ray with pulmonary vascular congestion.   -Beta-blocker previously held in the setting of Mobitz 2, but now he is status post CRT-P. Plan: --cont IV lasix 60 mg BID  Continue hydralazine and nitrate.   Consideration on adding carvedilol if blood pressure allows -Close follow-up in clinic   Coronary artery disease CAD status post CABG x5 Status post VF arrest in October 2021 with subsequent finding of severe multivessel CAD status post CABG x5.   Diagnostic catheterization October 2022 showed 5 of 5 patent grafts with known, severe native CAD.   Plan: --cont plavix and statin -Consider addition of low-dose beta-blocker  Elevated troponin 2/2 demand ischemia   Trifascicular block/intermittent  complete heart block Status post CRT-P Outpatient PCP/EP follow-up   hypertension Currently stable --cont IV diuresis Consider adding low-dose carvedilol 3.125 twice daily today or tomorrow   Hyperlipidemia PTA statin   Stage IIIb chronic kidney disease Relatively stable Monitor while on diuretics   Hypokalemia Monitor and supplement as necessary   Diabetes mellitus, well controlled Last A1c 7.1 Plan: --cont Lantus 15u nightly (home dose 25 units Moderate sliding scale Nightly sliding-scale coverage Carb modified diet   Bladder spasms PTA oxybutynin   GERD PTA PPI   Obesity, BMI 35 This complicates overall care and prognosis   History of zoster PTA Zovirax   DVT prophylaxis: Lovenox SQ Code Status: Full code  Family Communication:  Level of care: Telemetry Cardiac Dispo:   The patient is from: home Anticipated d/c is to: home Anticipated d/c date is: 1-2 days Patient currently is not medically ready to d/c due to: on IV lasix   Subjective and Interval History:  Pt reported breathing was a lot better after IV lasix, putting out good amount of urine.  Denied significant swelling.   Objective: Vitals:   10/28/21 0750 10/28/21 1133 10/28/21 1614 10/28/21 2018  BP: 126/85 (!) 142/83 130/76 (!) 152/94  Pulse: 61 68 66 71  Resp: 18 18 18 19   Temp: 98.2 F (36.8 C) 98.3 F (36.8 C) 98.5 F (36.9 C) 98.2 F (36.8 C)  TempSrc:  Oral    SpO2: 98% 99% 97% 98%  Weight:        Intake/Output Summary (Last 24 hours) at 10/28/2021 2150 Last data filed at 10/28/2021 2018 Gross per 24 hour  Intake 960 ml  Output 3625 ml  Net -2665 ml   Filed Weights   10/27/21 0649 10/27/21 1738 10/28/21 3716  Weight: 104.3 kg 106.5 kg 105.3 kg    Examination:   Constitutional: NAD, AAOx3 HEENT: conjunctivae and lids normal, EOMI CV: No cyanosis.   RESP: having to take frequent breaths while talking, on RA Extremities: Mild non-pitting edema in BLE SKIN: warm,  dry Neuro: II - XII grossly intact.   Psych: Normal mood and affect.  Appropriate judgement and reason   Data Reviewed: I have personally reviewed following labs and imaging studies  CBC: Recent Labs  Lab 10/27/21 0658  WBC 9.6  HGB 13.4  HCT 44.4  MCV 88.6  PLT 967   Basic Metabolic Panel: Recent Labs  Lab 10/27/21 0658 10/28/21 0714  NA 141 142  K 3.3* 3.6  CL 105 106  CO2 27 29  GLUCOSE 192* 73  BUN 37* 35*  CREATININE 1.74* 1.79*  CALCIUM 8.4* 8.2*   GFR: Estimated Creatinine Clearance: 36.2 mL/min (A) (by C-G formula based on SCr of 1.79 mg/dL (H)). Liver Function Tests: No results for input(s): AST, ALT, ALKPHOS, BILITOT, PROT, ALBUMIN in the last 168 hours. No results for input(s): LIPASE, AMYLASE in the last 168 hours. No results for input(s): AMMONIA in the last 168 hours. Coagulation Profile: No results for input(s): INR, PROTIME in the last 168 hours. Cardiac Enzymes: No results for input(s): CKTOTAL, CKMB, CKMBINDEX, TROPONINI in the last 168 hours. BNP (last 3 results) No results for input(s): PROBNP in the last 8760 hours. HbA1C: No results for input(s): HGBA1C in the last 72 hours. CBG: Recent Labs  Lab 10/28/21 0749 10/28/21 1133 10/28/21 1614 10/28/21 1615 10/28/21 2125  GLUCAP 77 206* 64* 70 154*   Lipid Profile: No results for input(s): CHOL, HDL, LDLCALC, TRIG, CHOLHDL, LDLDIRECT in the last 72 hours. Thyroid Function Tests: No results for input(s): TSH, T4TOTAL, FREET4, T3FREE, THYROIDAB in the last 72 hours. Anemia Panel: No results for input(s): VITAMINB12, FOLATE, FERRITIN, TIBC, IRON, RETICCTPCT in the last 72 hours. Sepsis Labs: No results for input(s): PROCALCITON, LATICACIDVEN in the last 168 hours.  Recent Results (from the past 240 hour(s))  Resp Panel by RT-PCR (Flu A&B, Covid) Nasopharyngeal Swab     Status: None   Collection Time: 10/27/21  6:59 AM   Specimen: Nasopharyngeal Swab; Nasopharyngeal(NP) swabs in vial  transport medium  Result Value Ref Range Status   SARS Coronavirus 2 by RT PCR NEGATIVE NEGATIVE Final    Comment: (NOTE) SARS-CoV-2 target nucleic acids are NOT DETECTED.  The SARS-CoV-2 RNA is generally detectable in upper respiratory specimens during the acute phase of infection. The lowest concentration of SARS-CoV-2 viral copies this assay can detect is 138 copies/mL. A negative result does not preclude SARS-Cov-2 infection and should not be used as the sole basis for treatment or other patient management decisions. A negative result may occur with  improper specimen collection/handling, submission of specimen other than nasopharyngeal swab, presence of viral mutation(s) within the areas targeted by this assay, and inadequate number of viral copies(<138 copies/mL). A negative result must be combined with clinical observations, patient history, and epidemiological information. The expected result is Negative.  Fact Sheet for Patients:  EntrepreneurPulse.com.au  Fact Sheet for Healthcare Providers:  IncredibleEmployment.be  This test is no t yet approved or cleared by the [place] FDA and  has been authorized for detection and/or diagnosis of SARS-CoV-2 by FDA under an Emergency Use Authorization (EUA). This EUA will remain  in effect (meaning this test can be used) for the duration of the COVID-19 declaration under Section 564(b)(1) of the Act, 21 U.S.C.section 360bbb-3(b)(1), unless the authorization is terminated  or revoked sooner.       Influenza A by PCR NEGATIVE NEGATIVE Final   Influenza B by PCR NEGATIVE NEGATIVE Final    Comment: (NOTE) The Xpert Xpress SARS-CoV-2/FLU/RSV plus assay is intended as an aid in the diagnosis of influenza from Nasopharyngeal swab specimens and should not be used as a sole basis for treatment. Nasal washings and aspirates are unacceptable for Xpert Xpress SARS-CoV-2/FLU/RSV testing.  Fact  Sheet for Patients: EntrepreneurPulse.com.au  Fact Sheet for Healthcare Providers: IncredibleEmployment.be  This test is not yet approved or cleared by the [place] FDA and has been authorized for detection and/or diagnosis of SARS-CoV-2 by FDA under an Emergency Use Authorization (EUA). This EUA will remain in effect (meaning this test can be used) for the duration of the COVID-19 declaration under Section 564(b)(1) of the Act, 21 U.S.C. section 360bbb-3(b)(1), unless the authorization is terminated or revoked.  Performed at [facility], [address] [place], [place] [address]       Radiology Studies: DG Chest 2 View  Result Date: 10/27/2021 CLINICAL DATA:  Chest pain and shortness of breath. EXAM: CHEST - 2 VIEW COMPARISON:  10/13/2021 FINDINGS: The cardio pericardial silhouette is enlarged. There is pulmonary vascular congestion without overt pulmonary edema. Atelectasis/scarring noted at the bases. Calcification overlying the right mid lung compatible with patient's known calcified right subscapular mass. Left-sided permanent pacemaker. Telemetry leads overlie the chest. IMPRESSION: Enlarged cardiopericardial silhouette with pulmonary vascular congestion. Electronically Signed   By: Misty Stanley M.D.   On: 10/27/2021 07:43     Scheduled Meds:  acyclovir  800 mg Oral Daily   clopidogrel  75 mg Oral Daily   enoxaparin (LOVENOX) injection  0.5 mg/kg Subcutaneous Q24H   furosemide  60 mg Intravenous Q12H   hydrALAZINE  25 mg Oral TID   insulin aspart  0-15 Units Subcutaneous TID WC   insulin aspart  0-5 Units Subcutaneous QHS   insulin detemir  15 Units Subcutaneous QHS   isosorbide mononitrate  30 mg Oral Daily   oxybutynin  5 mg Oral BID   pantoprazole  40 mg Oral BID   potassium chloride  40 mEq Oral Daily   rosuvastatin  40 mg Oral Daily   sodium chloride flush  3 mL Intravenous Q12H   Continuous Infusions:  sodium  chloride       LOS: 1 day     Enzo Bi, MD [facility] If 7PM-7AM, please contact night-coverage 10/28/2021, 9:50 PM

## 2021-10-28 NOTE — Progress Notes (Signed)
Progress Note  Patient Name: [name] Date of Encounter: 10/28/2021  Primary Cardiologist: [name], MD  Subjective   Reports feeling somewhat better, still not at his baseline Still with mild abdominal fullness, little bit of leg swelling but much improved  Inpatient Medications    Scheduled Meds:  acyclovir  800 mg Oral Daily   clopidogrel  75 mg Oral Daily   enoxaparin (LOVENOX) injection  0.5 mg/kg Subcutaneous Q24H   furosemide  60 mg Intravenous Q12H   hydrALAZINE  25 mg Oral TID   insulin aspart  0-15 Units Subcutaneous TID WC   insulin aspart  0-5 Units Subcutaneous QHS   insulin detemir  15 Units Subcutaneous QHS   isosorbide mononitrate  30 mg Oral Daily   oxybutynin  5 mg Oral BID   pantoprazole  40 mg Oral BID   potassium chloride  40 mEq Oral Daily   rosuvastatin  40 mg Oral Daily   sodium chloride flush  3 mL Intravenous Q12H   Continuous Infusions:  sodium chloride     PRN Meds: sodium chloride, acetaminophen, albuterol, ondansetron (ZOFRAN) IV, sodium chloride flush, traMADol   Vital Signs    Vitals:   10/27/21 2048 10/27/21 2327 10/28/21 0633 10/28/21 0750  BP: 127/82 129/80 135/84 126/85  Pulse: (!) 54 67 69 61  Resp: 20 18 18 18   Temp: 98.5 F (36.9 C) 98.6 F (37 C) 98.2 F (36.8 C) 98.2 F (36.8 C)  TempSrc: Oral  Oral   SpO2: 97% 98% 99% 98%  Weight:   105.3 kg     Intake/Output Summary (Last 24 hours) at 10/28/2021 1017 Last data filed at 10/28/2021 0500 Gross per 24 hour  Intake 240 ml  Output 2900 ml  Net -2660 ml   Filed Weights   10/27/21 0649 10/27/21 1738 10/28/21 0633  Weight: 104.3 kg 106.5 kg 105.3 kg    Physical Exam   GEN: Well nourished, well developed, in no acute distress.  HEENT: Grossly normal.  Neck: Supple, no JVD, carotid bruits, or masses. Cardiac: RRR, no murmurs, rubs, or gallops. No clubbing, cyanosis, edema.  Radials 2+, DP/PT 2+ and equal bilaterally.  Respiratory:  Respirations  regular and unlabored, clear to auscultation bilaterally. GI: Soft, nontender, nondistended, BS + x 4. MS: no deformity or atrophy. Skin: warm and dry, no rash. Neuro:  Strength and sensation are intact. Psych: AAOx3.  Normal affect.  Labs    Chemistry Recent Labs  Lab 10/27/21 0658 10/28/21 0714  NA 141 142  K 3.3* 3.6  CL 105 106  CO2 27 29  GLUCOSE 192* 73  BUN 37* 35*  CREATININE 1.74* 1.79*  CALCIUM 8.4* 8.2*  GFRNONAA 38* 37*  ANIONGAP 9 7     Hematology Recent Labs  Lab 10/27/21 0658  WBC 9.6  RBC 5.01  HGB 13.4  HCT 44.4  MCV 88.6  MCH 26.7  MCHC 30.2  RDW 15.5  PLT 255    Cardiac Enzymes  Recent Labs  Lab 10/27/21 0658 10/27/21 0936  TROPONINIHS 31* 32*      BNP Recent Labs  Lab 10/27/21 0658  BNP 1,801.4*     DDimer No results for input(s): DDIMER in the last 168 hours.   Lipids  Lab Results  Component Value Date   CHOL 114 08/27/2021   HDL 45 08/27/2021   LDLCALC 53 08/27/2021   TRIG 78 08/27/2021   CHOLHDL 2.5 08/27/2021    HbA1c  Lab Results  Component Value  Date   HGBA1C 7.1 (H) 08/26/2021    Radiology    DG Chest 2 View  Result Date: 10/27/2021 CLINICAL DATA:  Chest pain and shortness of breath. EXAM: CHEST - 2 VIEW COMPARISON:  10/13/2021 FINDINGS: The cardio pericardial silhouette is enlarged. There is pulmonary vascular congestion without overt pulmonary edema. Atelectasis/scarring noted at the bases. Calcification overlying the right mid lung compatible with patient's known calcified right subscapular mass. Left-sided permanent pacemaker. Telemetry leads overlie the chest. IMPRESSION: Enlarged cardiopericardial silhouette with pulmonary vascular congestion. Electronically Signed   By: Misty Stanley M.D.   On: 10/27/2021 07:43    Telemetry    Paced rhythm- Personally Reviewed  ECG     - Personally Reviewed  Cardiac Studies   Cardiac catheterization 1.  Severe underlying three-vessel coronary artery disease  with patent grafts including LIMA to LAD, SVG to large diagonal, sequential SVG to OM /distal left circumflex and SVG to right PDA. 2.  Right heart catheterization showed normal right and left-sided filling pressures, minimal pulmonary hypertension and normal cardiac output.   Recommendations: Continue medical therapy for coronary artery disease and congestive heart failure. Volume status appears optimal at this time.    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  Acute on chronic combined systolic and diastolic congestive heart failure/ischemic cardiomyopathy:  minus 2.6L overnight.  Wt down 1.2 kg - still above reported dry wt trend of 226-229 lbs. --EF 35 to 40% by echo in October of this year.  Prior hospitalizations for similar CHF exacerbation Suspect dietary noncompliance We have stressed importance of monitoring weight at home and compliance with his Lasix BNP is elevated at 1881.   Chest x-ray with pulmonary vascular congestion.   Clinically still appears mildly volume overloaded  --- Recommended additional day of IV diuresis if hospitalist service agrees  Continue hydralazine and nitrate.   Consideration on adding carvedilol if blood pressure allows -Beta-blocker previously held in the setting of Mobitz 2, but now he is status post CRT-P. -Close follow-up in clinic  2.  Coronary artery disease/elevated troponin:  Status post VF arrest in October 2021 with subsequent finding of severe multivessel CAD status post CABG x5.   Diagnostic catheterization October 2022  showed 5 of 5 patent grafts with known, severe native CAD.   Continue nitrate, Plavix, and statin therapy -Consider addition of low-dose beta-blocker   3.  Trifascicular block/intermittent complete heart block:  Status post CRT-P.   4.  Essential hypertension:  Systolic running 631-4 40 Consider adding low-dose carvedilol 3.125 twice daily today or tomorrow  5.  Hyperlipidemia: LDL 53 in October.   Continue statin.  6.  chronic kidney disease:  Stage III, stable  7.  Acute diabetes mellitus:  A1c 7.1 earlier this year.  Per medicine team.   Signed, Esmond Plants, MD, Ph.D [facility]  For questions or updates, please contact   Please consult [email or web address] for contact info under Cardiology/STEMI.

## 2021-10-28 NOTE — Progress Notes (Signed)
PT Cancellation Note  Patient Details Name: [name] MRN: [id number] DOB: January 11, 1935   Cancelled Treatment:    Reason Eval/Treat Not Completed: Other (comment). Consult received and chart reviewed. Pt screened out for services. He reports he is at baseline level. Reviewed evaluation from OT and pt confirms he doesn't require any services or follow up therapy. Will dc in house, please re-order if needs change.   Jaselyn Nahm 10/28/2021, 9:31 AM Greggory Stallion, PT, DPT [phone]

## 2021-10-29 ENCOUNTER — Telehealth: Payer: Self-pay | Admitting: *Deleted

## 2021-10-29 DIAGNOSIS — I442 Atrioventricular block, complete: Secondary | ICD-10-CM

## 2021-10-29 LAB — BASIC METABOLIC PANEL
Anion gap: 5 (ref 5–15)
BUN: 31 mg/dL — ABNORMAL HIGH (ref 8–23)
CO2: 29 mmol/L (ref 22–32)
Calcium: 8.4 mg/dL — ABNORMAL LOW (ref 8.9–10.3)
Chloride: 105 mmol/L (ref 98–111)
Creatinine, Ser: 1.77 mg/dL — ABNORMAL HIGH (ref 0.61–1.24)
GFR, Estimated: 37 mL/min — ABNORMAL LOW (ref >60)
Glucose, Bld: 112 mg/dL — ABNORMAL HIGH (ref 70–99)
Potassium: 3.6 mmol/L (ref 3.5–5.1)
Sodium: 139 mmol/L (ref 135–145)

## 2021-10-29 LAB — CBC
HCT: 37.9 % — ABNORMAL LOW (ref 39.0–52.0)
Hemoglobin: 12 g/dL — ABNORMAL LOW (ref 13.0–17.0)
MCH: 27 pg (ref 26.0–34.0)
MCHC: 31.7 g/dL (ref 30.0–36.0)
MCV: 85.2 fL (ref 80.0–100.0)
Platelets: 206 10*3/uL (ref 150–400)
RBC: 4.45 MIL/uL (ref 4.22–5.81)
RDW: 15.4 % (ref 11.5–15.5)
WBC: 4.6 10*3/uL (ref 4.0–10.5)
nRBC: 0 % (ref 0.0–0.2)

## 2021-10-29 LAB — GLUCOSE, CAPILLARY
Glucose-Capillary: 126 mg/dL — ABNORMAL HIGH (ref 70–99)
Glucose-Capillary: 157 mg/dL — ABNORMAL HIGH (ref 70–99)
Glucose-Capillary: 150 mg/dL — ABNORMAL HIGH (ref 70–99)
Glucose-Capillary: 155 mg/dL — ABNORMAL HIGH (ref 70–99)

## 2021-10-29 LAB — MAGNESIUM: Magnesium: 2 mg/dL (ref 1.7–2.4)

## 2021-10-29 LAB — HEMOGLOBIN A1C
Hgb A1c MFr Bld: 6.8 % — ABNORMAL HIGH (ref 4.8–5.6)
Mean Plasma Glucose: 148 mg/dL

## 2021-10-29 MED ORDER — INSULIN DETEMIR 100 UNIT/ML ~~LOC~~ SOLN
12.0000 [IU] | Freq: Every day | SUBCUTANEOUS | Status: DC
  Administered 2021-10-29: 23:00:00 12 [IU] via SUBCUTANEOUS
  Filled 2021-10-29 (×2): qty 0.12

## 2021-10-29 MED ORDER — CARVEDILOL 3.125 MG PO TABS
3.1250 mg | ORAL_TABLET | Freq: Two times a day (BID) | ORAL | Status: DC
  Administered 2021-10-29 – 2021-10-30 (×3): 3.125 mg via ORAL
  Filled 2021-10-29 (×3): qty 1

## 2021-10-29 NOTE — Progress Notes (Signed)
Mobility Specialist - Progress Note   10/29/21 1100  Mobility  Activity Ambulated to bathroom  Level of Assistance Standby assist, set-up cues, supervision of patient - no hands on  Distance Ambulated (ft) 10 ft  Mobility Out of bed for toileting;Ambulated with assistance in room  Mobility Response Tolerated well  Mobility performed by Mobility specialist  $Mobility charge 1 Mobility    Pt lying in bed upon arrival, utilizing RA. Independent for bed mobility. Ambulated to bathroom with supervision. No AD, no LOB. Pt instructed to pull call bell for assistance when finished, pt showed understanding. Nursing staff made aware.    Kathee Delton Mobility Specialist 10/29/21, 11:34 AM

## 2021-10-29 NOTE — Telephone Encounter (Signed)
-----   Message from Ginger Blue, PA-C sent at 10/29/2021 11:42 AM EST ----- Regarding: hospital follow-up PT needs hospital follow-up in 2-3 weeks. Please call and schedule.

## 2021-10-29 NOTE — Progress Notes (Signed)
Occupational Therapy Treatment Patient Details Name: [name] MRN: [id number] DOB: 1935-03-06 Today's Date: 10/29/2021   History of present illness 86 year old male with a history of ventricular fibrillation arrest, CAD status post CABG x5 in October 2021, ischemic cardiomyopathy, heart failure with reduced ejection fraction, hypertension, hyperlipidemia, diabetes, obesity, and stage III chronic kidney disease, who presented to [facility] complaining of chest pressure and shortness of breath.Pt admitted for acute on chronic combined systolic and diastolic congestive heart failure/ischemic cardiomyopathy   OT comments  Chart reviewed, pt greeted in bed, agreeable to OT tx session. Tx session targeted improving ADL completion with use of EC techniques. Good use of techniques with carry over noted during tx session. Would continue to benefit from further education while admitted,is making progress towards goal acquisition. Pt is left in bedside chair, NAD, all needs met. OT will continue to follow while admitted.    Recommendations for follow up therapy are one component of a multi-disciplinary discharge planning process, led by the attending physician.  Recommendations may be updated based on patient status, additional functional criteria and insurance authorization.    Follow Up Recommendations  No OT follow up    Assistance Recommended at Discharge Intermittent Supervision/Assistance  Equipment Recommendations  None recommended by OT    Recommendations for Other Services      Precautions / Restrictions Precautions Precautions: Fall Restrictions Weight Bearing Restrictions: No       Mobility Bed Mobility Overal bed mobility: Needs Assistance Bed Mobility: Supine to Sit     Supine to sit: HOB elevated;Supervision          Transfers Overall transfer level: Needs assistance Equipment used: Rolling walker (2 wheels) Transfers: Sit to/from Stand;Bed to chair/wheelchair/BSC Sit  to Stand: Supervision   Step pivot transfers: Supervision             Balance Overall balance assessment: Needs assistance Sitting-balance support: No upper extremity supported;Feet unsupported Sitting balance-[name]: Good     Standing balance support: During functional activity;Bilateral upper extremity supported Standing balance-[name]: Good                             ADL either performed or assessed with clinical judgement   ADL Overall ADL's : Needs assistance/impaired                                       General ADL Comments: SUP with frequent vcs for EC sit/stand at sink level for grooming tasks; close SUP with RW for functional mobility to bedside chair    Extremity/Trunk Assessment Upper Extremity Assessment Upper Extremity Assessment: Overall WFL for tasks assessed   Lower Extremity Assessment Lower Extremity Assessment: Generalized weakness   Cervical / Trunk Assessment Cervical / Trunk Assessment: Normal    Vision Baseline Vision/History: 1 Wears glasses Patient Visual Report: No change from baseline     Perception     Praxis      Cognition Arousal/Alertness: Awake/alert Behavior During Therapy: WFL for tasks assessed/performed Overall Cognitive Status: Within Functional Limits for tasks assessed                                 General Comments: alert and oriented x4          Exercises Other Exercises Other Exercises: edu re: role  of OT, POC, EC techniques- activity pacing, positioning   Shoulder Instructions       General Comments RA, SPO2 at 91% with standing grooming tasks, recovered to 98% with rest.    Pertinent Vitals/ Pain       Pain Assessment: No/denies pain   Frequency  Min 2X/week        Progress Toward Goals  OT Goals(current goals can now be found in the care plan section)  Progress towards OT goals: Progressing toward goals  Acute Rehab OT Goals Patient  Stated Goal: to go home OT Goal Formulation: With patient Time For Goal Achievement: 11/12/21 Potential to Achieve Goals: Good  Plan Discharge plan remains appropriate    Co-evaluation                 AM-PAC OT "6 Clicks" Daily Activity     Outcome Measure   Help from another person eating meals?: None Help from another person taking care of personal grooming?: None Help from another person toileting, which includes using toliet, bedpan, or urinal?: A Little Help from another person bathing (including washing, rinsing, drying)?: A Little Help from another person to put on and taking off regular upper body clothing?: None Help from another person to put on and taking off regular lower body clothing?: A Little 6 Click Score: 21    End of Session Equipment Utilized During Treatment: Gait belt;Rolling walker (2 wheels)  OT Visit Diagnosis: Unsteadiness on feet (R26.81);Muscle weakness (generalized) (M62.81)   Activity Tolerance Patient tolerated treatment well   Patient Left in chair;with call bell/phone within reach;with chair alarm set   Nurse Communication Mobility status        Time: 6269-4854 OT Time Calculation (min): 21 min  Charges: OT General Charges $OT Visit: 1 Visit OT Treatments $Self Care/Home Management : 8-22 mins  Shanon Payor, OTD OTR/L  10/29/21, 12:38 PM

## 2021-10-29 NOTE — Progress Notes (Signed)
Inpatient Diabetes Program Recommendations  AACE/ADA: New Consensus Statement on Inpatient Glycemic Control   Target Ranges:  Prepandial:   less than 140 mg/dL      Peak postprandial:   less than 180 mg/dL (1-2 hours)      Critically ill patients:  140 - 180 mg/dL    Latest Reference Range & Units 10/28/21 07:49 10/28/21 11:33 10/28/21 16:14 10/28/21 16:15 10/28/21 21:25 10/29/21 07:35  Glucose-Capillary 70 - 99 mg/dL 77 206 (H) 64 (L) 70 154 (H) 126 (H)   Review of Glycemic Control  Diabetes history: DM2 Outpatient Diabetes medications: Levemir 25 units QHS Current orders for Inpatient glycemic control: Levemir 15 units QHS, Novolog 0-15 units TID with meals, Novolog 0-5 units QHS  Inpatient Diabetes Program Recommendations:    Insulin: Please consider decreasing Levemir to 12 units QHS and Novolog correction to 0-9 units TID with meals.  Thanks, Barnie Alderman, RN, MSN, CDE Diabetes Coordinator Inpatient Diabetes Program [phone] (Team Pager from 8am to 5pm)

## 2021-10-29 NOTE — Progress Notes (Signed)
PROGRESS NOTE    [name]  IWP:[id number] DOB: 12-28-1934 DOA: 10/27/2021 PCP: Tomasita Morrow, MD  [phone]   Assessment & Plan:   Principal Problem:   Acute decompensated heart failure (HCC)   [name] is a 86 y.o. male with medical history significant of VF arrest, CAD status post CABG x5 in October 2021, ischemic cardiomyopathy EF 25 to 30%, hypertension, hyperlipidemia, high-grade heart block status post CRT-P, stage IIIb chronic kidney disease who presents to the ED for evaluation of chest pain or shortness of breath.   Patient is well-known to [facility] cardiology service.  They have been made aware and consultation has been requested.  Patient has known nonadherence to his home diuretic regiment with frequent readmissions for similar complaints.   On arrival patient had signs and symptoms consistent with fluid overload including pulmonary edema noted on chest x-ray, bilateral lower extremity edema, elevated BNP.  Received IV Lasix 80 mg x 1 and hospitalist contacted for admission.   Acute on chronic systolic and diastolic congestive heart failure Ischemic cardiomyopathy EF 35 to 40% in October Presented with acute onset dyspnea BNP is elevated at 1881.   Chest x-ray with pulmonary vascular congestion.   -Beta-blocker previously held in the setting of Mobitz 2, but now he is status post CRT-P. Plan: --cont IV lasix 60 mg BID  Continue hydralazine and nitrate.   --add coreg today -Close follow-up in clinic   Coronary artery disease CAD status post CABG x5 Status post VF arrest in October 2021 with subsequent finding of severe multivessel CAD status post CABG x5.   Diagnostic catheterization October 2022 showed 5 of 5 patent grafts with known, severe native CAD.   Plan: --cont plavix and statin --add coreg today  Elevated troponin 2/2 demand ischemia   Trifascicular block/intermittent complete heart block Status post CRT-P Outpatient PCP/EP follow-up    hypertension Currently stable --cont IV diuresis --add coreg today   Hyperlipidemia --cont statin   Stage IIIb chronic kidney disease Relatively stable Monitor while on diuretics   Hypokalemia Monitor and supplement as necessary   Diabetes mellitus, well controlled Last A1c 7.1 Plan: --reduce Lantus to 12u nightly --SSI TID   Bladder spasms PTA oxybutynin   GERD PTA PPI   Obesity, BMI 35 This complicates overall care and prognosis   History of zoster PTA Zovirax   DVT prophylaxis: Lovenox SQ Code Status: Full code  Family Communication:  Level of care: Telemetry Cardiac Dispo:   The patient is from: home Anticipated d/c is to: home Anticipated d/c date is: tomorrow Patient currently is not medically ready to d/c due to: on IV lasix   Subjective and Interval History:  [name] reported breathing better.  Continued to have good urine output.  Ate.     Objective: Vitals:   10/29/21 0735 10/29/21 1142 10/29/21 1223 10/29/21 1552  BP: 139/80 136/88  139/77  Pulse: 64 79  62  Resp: 17 18  18   Temp: 98.3 F (36.8 C) 98 F (36.7 C)  98.2 F (36.8 C)  TempSrc: Oral     SpO2: 99% 95% 98% 100%  Weight:        Intake/Output Summary (Last 24 hours) at 10/29/2021 1753 Last data filed at 10/29/2021 1350 Gross per 24 hour  Intake 720 ml  Output 3100 ml  Net -2380 ml   Filed Weights   10/27/21 1738 10/28/21 0633 10/29/21 0438  Weight: 106.5 kg 105.3 kg 104.1 kg    Examination:   Constitutional: NAD,  AAOx3 HEENT: conjunctivae and lids normal, EOMI CV: No cyanosis.   RESP: breaths seemed a bit short while talking, on RA Neuro: II - XII grossly intact.   Psych: Normal mood and affect.  Appropriate judgement and reason   Data Reviewed: I have personally reviewed following labs and imaging studies  CBC: Recent Labs  Lab 10/27/21 0658 10/29/21 0600  WBC 9.6 4.6  HGB 13.4 12.0*  HCT 44.4 37.9*  MCV 88.6 85.2  PLT 255 169   Basic Metabolic  Panel: Recent Labs  Lab 10/27/21 0658 10/28/21 0714 10/29/21 0600  NA 141 142 139  K 3.3* 3.6 3.6  CL 105 106 105  CO2 27 29 29   GLUCOSE 192* 73 112*  BUN 37* 35* 31*  CREATININE 1.74* 1.79* 1.77*  CALCIUM 8.4* 8.2* 8.4*  MG  --   --  2.0   GFR: Estimated Creatinine Clearance: 36.3 mL/min (A) (by C-G formula based on SCr of 1.77 mg/dL (H)). Liver Function Tests: No results for input(s): AST, ALT, ALKPHOS, BILITOT, PROT, ALBUMIN in the last 168 hours. No results for input(s): LIPASE, AMYLASE in the last 168 hours. No results for input(s): AMMONIA in the last 168 hours. Coagulation Profile: No results for input(s): INR, PROTIME in the last 168 hours. Cardiac Enzymes: No results for input(s): CKTOTAL, CKMB, CKMBINDEX, TROPONINI in the last 168 hours. BNP (last 3 results) No results for input(s): PROBNP in the last 8760 hours. HbA1C: Recent Labs    10/28/21 0714  HGBA1C 6.8*   CBG: Recent Labs  Lab 10/28/21 1615 10/28/21 2125 10/29/21 0735 10/29/21 1142 10/29/21 1635  GLUCAP 70 154* 126* 157* 150*   Lipid Profile: No results for input(s): CHOL, HDL, LDLCALC, TRIG, CHOLHDL, LDLDIRECT in the last 72 hours. Thyroid Function Tests: No results for input(s): TSH, T4TOTAL, FREET4, T3FREE, THYROIDAB in the last 72 hours. Anemia Panel: No results for input(s): VITAMINB12, FOLATE, FERRITIN, TIBC, IRON, RETICCTPCT in the last 72 hours. Sepsis Labs: No results for input(s): PROCALCITON, LATICACIDVEN in the last 168 hours.  Recent Results (from the past 240 hour(s))  Resp Panel by RT-PCR (Flu A&B, Covid) Nasopharyngeal Swab     Status: None   Collection Time: 10/27/21  6:59 AM   Specimen: Nasopharyngeal Swab; Nasopharyngeal(NP) swabs in vial transport medium  Result Value Ref Range Status   SARS Coronavirus 2 by RT PCR NEGATIVE NEGATIVE Final    Comment: (NOTE) SARS-CoV-2 target nucleic acids are NOT DETECTED.  The SARS-CoV-2 RNA is generally detectable in upper  respiratory specimens during the acute phase of infection. The lowest concentration of SARS-CoV-2 viral copies this assay can detect is 138 copies/mL. A negative result does not preclude SARS-Cov-2 infection and should not be used as the sole basis for treatment or other patient management decisions. A negative result may occur with  improper specimen collection/handling, submission of specimen other than nasopharyngeal swab, presence of viral mutation(s) within the areas targeted by this assay, and inadequate number of viral copies(<138 copies/mL). A negative result must be combined with clinical observations, patient history, and epidemiological information. The expected result is Negative.  Fact Sheet for Patients:  EntrepreneurPulse.com.au  Fact Sheet for Healthcare Providers:  IncredibleEmployment.be  This test is no t yet approved or cleared by the [place] FDA and  has been authorized for detection and/or diagnosis of SARS-CoV-2 by FDA under an Emergency Use Authorization (EUA). This EUA will remain  in effect (meaning this test can be used) for the duration of the COVID-19 declaration  under Section 564(b)(1) of the Act, 21 U.S.C.section 360bbb-3(b)(1), unless the authorization is terminated  or revoked sooner.       Influenza A by PCR NEGATIVE NEGATIVE Final   Influenza B by PCR NEGATIVE NEGATIVE Final    Comment: (NOTE) The Xpert Xpress SARS-CoV-2/FLU/RSV plus assay is intended as an aid in the diagnosis of influenza from Nasopharyngeal swab specimens and should not be used as a sole basis for treatment. Nasal washings and aspirates are unacceptable for Xpert Xpress SARS-CoV-2/FLU/RSV testing.  Fact Sheet for Patients: EntrepreneurPulse.com.au  Fact Sheet for Healthcare Providers: IncredibleEmployment.be  This test is not yet approved or cleared by the [place] FDA and has been  authorized for detection and/or diagnosis of SARS-CoV-2 by FDA under an Emergency Use Authorization (EUA). This EUA will remain in effect (meaning this test can be used) for the duration of the COVID-19 declaration under Section 564(b)(1) of the Act, 21 U.S.C. section 360bbb-3(b)(1), unless the authorization is terminated or revoked.  Performed at [facility], [address] [place], [place] [address]       Radiology Studies: No results found.   Scheduled Meds:  acyclovir  800 mg Oral Daily   carvedilol  3.125 mg Oral BID WC   clopidogrel  75 mg Oral Daily   enoxaparin (LOVENOX) injection  0.5 mg/kg Subcutaneous Q24H   furosemide  60 mg Intravenous Q12H   hydrALAZINE  25 mg Oral TID   insulin aspart  0-15 Units Subcutaneous TID WC   insulin detemir  12 Units Subcutaneous QHS   isosorbide mononitrate  30 mg Oral Daily   oxybutynin  5 mg Oral BID   pantoprazole  40 mg Oral BID   potassium chloride  40 mEq Oral Daily   rosuvastatin  40 mg Oral Daily   sodium chloride flush  3 mL Intravenous Q12H   Continuous Infusions:  sodium chloride       LOS: 2 days     Enzo Bi, MD [facility] If 7PM-7AM, please contact night-coverage 10/29/2021, 5:53 PM

## 2021-10-29 NOTE — Progress Notes (Signed)
Progress Note  Patient Name: [name] Date of Encounter: 10/29/2021  [facility] Cardiologist: [name], MD   Subjective   Patient is feeling better. UOP -3.2L.   Inpatient Medications    Scheduled Meds:  acyclovir  800 mg Oral Daily   clopidogrel  75 mg Oral Daily   enoxaparin (LOVENOX) injection  0.5 mg/kg Subcutaneous Q24H   furosemide  60 mg Intravenous Q12H   hydrALAZINE  25 mg Oral TID   insulin aspart  0-15 Units Subcutaneous TID WC   insulin detemir  12 Units Subcutaneous QHS   isosorbide mononitrate  30 mg Oral Daily   oxybutynin  5 mg Oral BID   pantoprazole  40 mg Oral BID   potassium chloride  40 mEq Oral Daily   rosuvastatin  40 mg Oral Daily   sodium chloride flush  3 mL Intravenous Q12H   Continuous Infusions:  sodium chloride     PRN Meds: sodium chloride, acetaminophen, albuterol, ondansetron (ZOFRAN) IV, sodium chloride flush, traMADol   Vital Signs    Vitals:   10/28/21 2346 10/29/21 0438 10/29/21 0441 10/29/21 0735  BP: (!) 150/77  127/78 139/80  Pulse: 79  62 64  Resp: 19  18 17   Temp: 98.2 F (36.8 C)  98.4 F (36.9 C) 98.3 F (36.8 C)  TempSrc:   Oral Oral  SpO2: 100%  100% 99%  Weight:  104.1 kg      Intake/Output Summary (Last 24 hours) at 10/29/2021 1106 Last data filed at 10/29/2021 1000 Gross per 24 hour  Intake 720 ml  Output 2900 ml  Net -2180 ml   Last 3 Weights 10/29/2021 10/28/2021 10/27/2021  Weight (lbs) 229 lb 6.4 oz 232 lb 2.3 oz 234 lb 11.2 oz  Weight (kg) 104.055 kg 105.3 kg 106.459 kg      Telemetry    AV paced, HR 70s, PVCs - Personally Reviewed  ECG    No new - Personally Reviewed  Physical Exam   GEN: No acute distress.   Neck: No JVD Cardiac: RRR, no murmurs, rubs, or gallops.  Respiratory: Clear to auscultation bilaterally. GI: Soft, nontender, non-distended  MS: No edema; No deformity. Neuro:  Nonfocal  Psych: Normal affect   Labs    High Sensitivity Troponin:   Recent  Labs  Lab 10/27/21 0658 10/27/21 0936  TROPONINIHS 31* 32*     Chemistry Recent Labs  Lab 10/27/21 0658 10/28/21 0714 10/29/21 0600  NA 141 142 139  K 3.3* 3.6 3.6  CL 105 106 105  CO2 27 29 29   GLUCOSE 192* 73 112*  BUN 37* 35* 31*  CREATININE 1.74* 1.79* 1.77*  CALCIUM 8.4* 8.2* 8.4*  MG  --   --  2.0  GFRNONAA 38* 37* 37*  ANIONGAP 9 7 5     Lipids No results for input(s): CHOL, TRIG, HDL, LABVLDL, LDLCALC, CHOLHDL in the last 168 hours.  Hematology Recent Labs  Lab 10/27/21 0658 10/29/21 0600  WBC 9.6 4.6  RBC 5.01 4.45  HGB 13.4 12.0*  HCT 44.4 37.9*  MCV 88.6 85.2  MCH 26.7 27.0  MCHC 30.2 31.7  RDW 15.5 15.4  PLT 255 206   Thyroid No results for input(s): TSH, FREET4 in the last 168 hours.  BNP Recent Labs  Lab 10/27/21 0658  BNP 1,801.4*    DDimer No results for input(s): DDIMER in the last 168 hours.   Radiology    No results found.  Cardiac Studies   Cardiac catheterization  1.  Severe underlying three-vessel coronary artery disease with patent grafts including LIMA to LAD, SVG to large diagonal, sequential SVG to OM /distal left circumflex and SVG to right PDA. 2.  Right heart catheterization showed normal right and left-sided filling pressures, minimal pulmonary hypertension and normal cardiac output.   Recommendations: Continue medical therapy for coronary artery disease and congestive heart failure. Volume status appears optimal at this time.  Patient Profile     86 y.o. 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, trifascicular block s/p CRT-P, and stage III chronic kidney disease, who is being seen today for the evaluation of chest pain and recurrent CHF  Assessment & Plan    Acute on chronic combined systolic and diastolic  CHF/ICM - BNP elevated 1881, CXR with pulmonary vascular congestion - suspect medication and diet noncompliance - IV lasix 60mg  BID - good UOP, net -4.8L - continue hydralazine and Imdur - low dose Coreg started - appears relatively euvolemic on exam>can possibly transition to oral diuretics - CKD limiting GDMT, kidney function stable - PTA lasix 80mg  daily, may need metolazone to enhance diuresis  CAD elevated troponin - s/p VF arrest in October 2021 with subsequent finding of severe multivessel CAD s/p CABGx5 - Cath in October 2022 showed 5/5 patent grafts with severe native CAD - HS trop minimally elevated with downward trend - no plan for ischemic worm-up.  - low dose BB added - continue Plavix and statin  CHB/trifasicular block - s/p CRT-P  HTN - Hydralazine and Imdur - Coreg added as above - BP reasonable  HLD - LDL 53 in October - continue statin  For questions or updates, please contact [facility] Please consult [email or web address] for contact info under        Signed, Cagney Degrace Ninfa Meeker, PA-C  10/29/2021, 11:06 AM

## 2021-10-29 NOTE — Telephone Encounter (Signed)
To scheduling>> if 2-3 week timeframe requested, then not a TCM call.

## 2021-10-29 NOTE — TOC Initial Note (Signed)
Transition of Care [facility]) - Initial/Assessment Note    Patient Details  Name: [name] MRN: [id number] Date of Birth: 1935/06/27  [facility]) CM/SW Contact:    Alberteen Sam, LCSW Phone Number: 10/29/2021, 10:21 AM  Clinical Narrative:                  CSW notes that patient is from home with wife. Per last recent admission patient stated wife is able to assist him at home. Patient states he is independent.  He still drives but wife takes him to his doctors apt.  No issues with getting his medications.  Wife will transport home at dc. PCP is Dr. Dorena Dew.     Expected Discharge Plan: Home/Self Care Barriers to Discharge: Continued Medical Work up   Patient Goals and CMS Choice Patient states their goals for this hospitalization and ongoing recovery are:: to go home CMS Medicare.gov Compare Post Acute Care list provided to:: Patient Choice offered to / list presented to : Patient  Expected Discharge Plan and Services Expected Discharge Plan: Home/Self Care                                              Prior Living Arrangements/Services                       Activities of Daily Living Home Assistive Devices/Equipment: Cane (specify quad or straight) ADL Screening (condition at time of admission) Patient's cognitive ability adequate to safely complete daily activities?: Yes Is the patient deaf or have difficulty hearing?: No Does the patient have difficulty seeing, even when wearing glasses/contacts?: No Does the patient have difficulty concentrating, remembering, or making decisions?: Yes Patient able to express need for assistance with ADLs?: Yes Does the patient have difficulty dressing or bathing?: No Independently performs ADLs?: Yes (appropriate for developmental age) Does the patient have difficulty walking or climbing stairs?: Yes Weakness of Legs: None Weakness of Arms/Hands: None  Permission Sought/Granted                   Emotional Assessment         Alcohol / Substance Use: Not Applicable Psych Involvement: No (comment)  Admission diagnosis:  Acute decompensated heart failure ([place]) [I50.9] Dyspnea, unspecified type [R06.00] Acute on chronic congestive heart failure, unspecified heart failure type [facility]) [I50.9] Patient Active Problem List   Diagnosis Date Noted   Acute decompensated heart failure ([place]) 10/27/2021   Complete heart block ([place]) 10/12/2021   Junctional bradycardia    Acute on chronic systolic congestive heart failure (HCC)    Acute on chronic diastolic CHF (congestive heart failure) ([place]) 08/26/2021   CAD (coronary artery disease)    HLD (hyperlipidemia)    GERD (gastroesophageal reflux disease)    Type II diabetes mellitus with renal manifestations ([place])    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   CAD, multiple vessel 08/25/2020   Non-ST elevation (NSTEMI) myocardial infarction ([place]) 08/21/2020   History of cardiac arrest 08/21/2020   HFrEF (heart failure with reduced ejection fraction) ([place])    History of sustained ventricular fibrillation 08/18/2020   Acute respiratory failure with hypoxia ([place])    Chronic diastolic heart failure ([place])    Stage 3a chronic kidney disease ([place]) 11/20/2019   UTI (urinary tract infection) 07/26/2019   HTN (hypertension) 07/26/2019   Diabetes ([place])  07/26/2019   Unstable angina ([place]) 12/03/2008   Status post percutaneous transluminal coronary angioplasty 10/08/2008   Malignant neoplasm of prostate ([place]) 10/05/2005   Essential hypertension 08/24/1989   PCP:  Tomasita Morrow, MD Pharmacy:   [id number] [address], [place] - [place] [address] Phone: [phone] Fax: [phone]     Social Determinants of Health (SDOH) Interventions    Readmission Risk Interventions Readmission Risk Prevention Plan 09/10/2020 08/28/2020  Transportation Screening - Complete  HRI or [place] - Complete  Social  Work Consult for [place] Planning/Counseling - Complete  Palliative Care Screening - Not Applicable  Medication Review Press photographer) - Complete  PCP or Specialist appointment within 3-5 days of discharge Complete -  [place] or Home Care Consult Complete -  SW Recovery Care/Counseling Consult Complete -  Palliative Care Screening Not Applicable -  [place] Not Applicable -  Some recent data might be hidden

## 2021-10-30 DIAGNOSIS — I5043 Acute on chronic combined systolic (congestive) and diastolic (congestive) heart failure: Secondary | ICD-10-CM

## 2021-10-30 DIAGNOSIS — I420 Dilated cardiomyopathy: Secondary | ICD-10-CM

## 2021-10-30 LAB — CBC
HCT: 39.2 % (ref 39.0–52.0)
Hemoglobin: 12.5 g/dL — ABNORMAL LOW (ref 13.0–17.0)
MCH: 27.2 pg (ref 26.0–34.0)
MCHC: 31.9 g/dL (ref 30.0–36.0)
MCV: 85.2 fL (ref 80.0–100.0)
Platelets: 234 10*3/uL (ref 150–400)
RBC: 4.6 MIL/uL (ref 4.22–5.81)
RDW: 15.4 % (ref 11.5–15.5)
WBC: 4.9 10*3/uL (ref 4.0–10.5)
nRBC: 0 % (ref 0.0–0.2)

## 2021-10-30 LAB — BASIC METABOLIC PANEL
Anion gap: 6 (ref 5–15)
BUN: 27 mg/dL — ABNORMAL HIGH (ref 8–23)
CO2: 29 mmol/L (ref 22–32)
Calcium: 8.5 mg/dL — ABNORMAL LOW (ref 8.9–10.3)
Chloride: 107 mmol/L (ref 98–111)
Creatinine, Ser: 1.73 mg/dL — ABNORMAL HIGH (ref 0.61–1.24)
GFR, Estimated: 38 mL/min — ABNORMAL LOW (ref >60)
Glucose, Bld: 117 mg/dL — ABNORMAL HIGH (ref 70–99)
Potassium: 3.8 mmol/L (ref 3.5–5.1)
Sodium: 142 mmol/L (ref 135–145)

## 2021-10-30 LAB — GLUCOSE, CAPILLARY
Glucose-Capillary: 110 mg/dL — ABNORMAL HIGH (ref 70–99)
Glucose-Capillary: 195 mg/dL — ABNORMAL HIGH (ref 70–99)

## 2021-10-30 LAB — MAGNESIUM: Magnesium: 2.1 mg/dL (ref 1.7–2.4)

## 2021-10-30 MED ORDER — METOLAZONE 2.5 MG PO TABS: 2.5000 mg | ORAL_TABLET | ORAL | 0 refills | Status: DC

## 2021-10-30 MED ORDER — FUROSEMIDE 40 MG PO TABS: 80.0000 mg | ORAL_TABLET | Freq: Every day | ORAL | Status: DC

## 2021-10-30 MED ORDER — HYDRALAZINE HCL 25 MG PO TABS: 25.0000 mg | ORAL_TABLET | Freq: Three times a day (TID) | ORAL | 0 refills | Status: DC

## 2021-10-30 MED ORDER — INSULIN DETEMIR 100 UNIT/ML ~~LOC~~ SOLN: 15.0000 [IU] | Freq: Every day | SUBCUTANEOUS | 0 refills | Status: DC

## 2021-10-30 MED ORDER — ISOSORBIDE MONONITRATE ER 30 MG PO TB24: 30.0000 mg | ORAL_TABLET | Freq: Every day | ORAL | 0 refills | Status: DC

## 2021-10-30 MED ORDER — CARVEDILOL 3.125 MG PO TABS: 3.1250 mg | ORAL_TABLET | Freq: Two times a day (BID) | ORAL | 0 refills | Status: DC

## 2021-10-30 MED ORDER — FUROSEMIDE 80 MG PO TABS: 80.0000 mg | ORAL_TABLET | Freq: Every day | ORAL | 0 refills | Status: DC

## 2021-10-30 NOTE — Care Management Important Message (Signed)
Important Message  Patient Details  Name: [name] MRN: [id number] Date of Birth: Mar 30, 1935   Medicare Important Message Given:  Yes     [name] 10/30/2021, 11:18 AM

## 2021-10-30 NOTE — Discharge Summary (Signed)
Physician Discharge Summary   [name]  male DOB: 07/05/1935  RSW:[id number]  PCP: Tomasita Morrow, MD  Admit date: 10/27/2021 Discharge date: 10/30/2021  Admitted From: home Disposition:  home Wife updated on the phone prior to discharge.  CODE STATUS: Full code  Discharge Instructions     Discharge instructions   Complete by: As directed    You were fluid overloaded from congestive heart failure, and received IV lasix.  Please continue to take your oral Lasix at home with addition of metolazone 2 times per week.  Please take all your medications as directed.  I have reduced your insulin Levemir from 25 units to 15 units nightly because you were having low blood sugar episodes and your A1c 6.8 is too low for your age and not safe.   Dr. Enzo Bi [facility] Course:  For full details, please see H&P, progress notes, consult notes and ancillary notes.  Briefly,  [name] is a 86 y.o. male with medical history significant of VF arrest, CAD status post CABG x5 in October 2021, ischemic cardiomyopathy, hypertension, high-grade heart block status post CRT-P, stage IIIb chronic kidney disease who presented to the ED for evaluation of chest pain or shortness of breath.   Patient is well-known to [facility] cardiology service.  Patient has known nonadherence to his home diuretic regiment with frequent readmissions for similar complaints.   On arrival patient had signs and symptoms consistent with fluid overload including pulmonary edema noted on chest x-ray, bilateral lower extremity edema, elevated BNP.  Received IV Lasix 80 mg x 1 and hospitalist contacted for admission.   Acute on chronic systolic and diastolic congestive heart failure Ischemic cardiomyopathy EF 35 to 40% in October 2022 Presented with acute onset dyspnea BNP is elevated at 1881.   Chest x-ray with pulmonary vascular congestion.   -Beta-blocker previously held in the setting of Mobitz 2,  but now he is status post CRT-P, so Coreg 3.125 twice daily added.   --Pt diuresed well with IV lasix 60 mg BID with improvement of his symptoms.  Pt was discharged on Lasix 80 daily with metolazone 2.5 twice a week.   Coronary artery disease CAD status post CABG x5 Status post VF arrest in October 2021 with subsequent finding of severe multivessel CAD status post CABG x5.   Diagnostic catheterization October 2022 showed 5 of 5 patent grafts with known, severe native CAD.   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   Hyperlipidemia --cont statin   Stage IIIb chronic kidney disease Relatively stable   Hypokalemia Monitored and supplemented as necessary   Diabetes mellitus, well controlled -- A1c 6.8 --Home Levemir reduced from 25 units to 15 units nightly because pt was having hypoglycemic episodes and A1c 6.8 is too low for his age    Bladder spasms PTA oxybutynin   GERD PTA PPI   Obesity, BMI 35 This complicates overall care and prognosis   History of zoster PTA Zovirax    Discharge Diagnoses:  Principal Problem:   Acute decompensated heart failure ([place])   30 Day Unplanned Readmission Risk Score    Flowsheet Row ED to Hosp-Admission (Current) from 10/27/2021 in [facility]  30  Day Unplanned Readmission Risk Score (%) 25.96 Filed at 10/30/2021 0801       This score is the patient's risk of an unplanned readmission within 30 days of being discharged (0 -100%). The score is based on dignosis, age, lab data, medications, orders, and past utilization.   Low:  0-14.9   Medium: 15-21.9   High: 22-29.9   Extreme: 30 and above         Discharge  Instructions:  Allergies as of 10/30/2021       Reactions   Angiotensin Receptor Blockers    hyperkalemia   Metformin Diarrhea   Spironolactone    Hyperkalemia        Medication List     STOP taking these medications    losartan 25 MG tablet Commonly known as: COZAAR   traMADol 50 MG tablet Commonly known as: ULTRAM       TAKE these medications    acetaminophen 500 MG tablet Commonly known as: TYLENOL Take 500-1,000 mg by mouth every 6 (six) hours as needed for mild pain or moderate pain.   acyclovir 800 MG tablet Commonly known as: ZOVIRAX Take 800 mg by mouth daily.   albuterol 108 (90 Base) MCG/ACT inhaler Commonly known as: VENTOLIN HFA Inhale 2 puffs into the lungs every 6 (six) hours as needed for wheezing or shortness of breath.   carvedilol 3.125 MG tablet Commonly known as: COREG Take 1 tablet (3.125 mg total) by mouth 2 (two) times daily with a meal.   clopidogrel 75 MG tablet Commonly known as: PLAVIX TAKE 1 TABLET BY MOUTH EVERY DAY   fluticasone 44 MCG/ACT inhaler Commonly known as: FLOVENT HFA Inhale 2 puffs into the lungs in the morning and at bedtime.   furosemide 80 MG tablet Commonly known as: LASIX Take 1 tablet (80 mg total) by mouth daily.   hydrALAZINE 25 MG tablet Commonly known as: APRESOLINE Take 1 tablet (25 mg total) by mouth 3 (three) times daily.   insulin detemir 100 UNIT/ML injection Commonly known as: LEVEMIR Inject 0.15 mLs (15 Units total) into the skin at bedtime. What changed: how much to take   isosorbide mononitrate 30 MG 24 hr tablet Commonly known as: IMDUR Take 1 tablet (30 mg total) by mouth daily.   magnesium oxide 400 MG tablet Commonly known as: MAG-OX TAKE 1 TABLET BY MOUTH EVERY DAY   metolazone 2.5 MG tablet Commonly known as: ZAROXOLYN Take 1 tablet (2.5 mg total) by mouth 2 (two) times a week. Start taking on: November 02, 2021   multivitamin with minerals Tabs tablet Take 1 tablet by  mouth daily.   oxybutynin 5 MG tablet Commonly known as: DITROPAN Take 5 mg by mouth 2 (two) times daily.   pantoprazole 40 MG tablet Commonly known as: PROTONIX Take 40 mg by mouth 2 (two) times daily.   rosuvastatin 40 MG tablet Commonly known as: CRESTOR Take 1 tablet (40 mg total) by mouth daily.         Follow-up Information     [facility], Vonzell Schlatter, MD Follow up in 1 week(s).   Specialty: Internal Medicine Contact information: [address] [place] [address] [phone]         [name], MD .   Specialty: Cardiology Contact information: [address]  [place] [address] [phone]                 Allergies  Allergen Reactions   Angiotensin Receptor  Blockers     hyperkalemia   Metformin Diarrhea   Spironolactone     Hyperkalemia      The results of significant diagnostics from this hospitalization (including imaging, microbiology, ancillary and laboratory) are listed below for reference.   Consultations:   Procedures/Studies: DG Chest 2 View  Result Date: 10/27/2021 CLINICAL DATA:  Chest pain and shortness of breath. EXAM: CHEST - 2 VIEW COMPARISON:  10/13/2021 FINDINGS: The cardio pericardial silhouette is enlarged. There is pulmonary vascular congestion without overt pulmonary edema. Atelectasis/scarring noted at the bases. Calcification overlying the right mid lung compatible with patient's known calcified right subscapular mass. Left-sided permanent pacemaker. Telemetry leads overlie the chest. IMPRESSION: Enlarged cardiopericardial silhouette with pulmonary vascular congestion. Electronically Signed   By: Misty Stanley M.D.   On: 10/27/2021 07:43   DG Chest 2 View  Result Date: 10/13/2021 CLINICAL DATA:  Post pacemaker EXAM: CHEST - 2 VIEW COMPARISON:  09/13/2021 FINDINGS: Cardiomegaly status post median sternotomy and CABG with left atrial appendage clip. Left chest multi lead pacer. Both  lungs are clear. Calcified mass again seen projecting over the right scapula. IMPRESSION: Cardiomegaly without acute abnormality of the lungs in AP portable projection. Electronically Signed   By: Delanna Ahmadi M.D.   On: 10/13/2021 09:59   CUP PACEART INCLINIC DEVICE CHECK  Result Date: 10/22/2021 Wound check appointment. Steri-strips removed. Wound without redness or edema. Incision edges approximated, wound well healed. Normal device function. Thresholds, sensing, and impedances consistent with implant measurements. Device programmed at 3.5V for  extra safety margin until 3 month visit. Histogram distribution appropriate for patient and level of activity. 18 AMS episodes, longest episode lasting ~10 minutes this AM with peak A/ V rates of 465/104. AT/Af Burden <1%, patient is NOT on [place].   No high ventricular rates noted. Patient educated about wound care, arm mobility, lifting restrictions. Patient is enrolled in remote monitoring, next scheduled check 01/12/22.  ROV with ROV in 3 months with Dr. Quentin Ore on 01/20/22.Trena Platt, BSN, RN  VAS US CAROTID  Result Date: 10/10/2021 Carotid Arterial Duplex Study Patient Name:  [name]  Date of Exam:   10/09/2021 Medical Rec #: [id number]     Accession #:    [id number] Date of Birth: 30-Oct-1935    Patient Gender: M Patient Age:   9 years Exam Location:  [place] Procedure:      VAS US CAROTID Referring Phys: Murray Hodgkins --------------------------------------------------------------------------------  Indications:       Follow-up to 08/27/20 exam. Risk Factors:      Hypertension, hyperlipidemia, Diabetes, past history of                    smoking, coronary artery disease. Other Factors:     Patient denies all cerebrovascular symptoms. Comparison Study:  08-27-20 carotid artery duplex exam reported RICA velocities                    of 44/01 cm/s and LICA velocities of 02/72 cm/s Performing Technologist: Pilar Jarvis RDMS, RVT, RDCS  Examination  Guidelines: A complete evaluation includes B-mode imaging, spectral Doppler, color Doppler, and power Doppler as needed of all accessible portions of each vessel. Bilateral testing is considered an integral part of a complete examination. Limited examinations for reoccurring indications may be performed as noted.  Right Carotid Findings: +----------+--------+--------+--------+------------------+--------+             PSV cm/s EDV cm/s Stenosis Plaque Description Comments  +----------+--------+--------+--------+------------------+--------+  CCA Prox  104      15                                             +----------+--------+--------+--------+------------------+--------+  CCA Distal 101      14                                             +----------+--------+--------+--------+------------------+--------+  ICA Prox   107      17       Normal                                +----------+--------+--------+--------+------------------+--------+  ICA Mid    84       21       Normal                                +----------+--------+--------+--------+------------------+--------+  ICA Distal 73       15                                             +----------+--------+--------+--------+------------------+--------+  ECA        113      13                                             +----------+--------+--------+--------+------------------+--------+ +----------+--------+-------+----------------+-------------------+             PSV cm/s EDV cms Describe         Arm Pressure (mmHG)  +----------+--------+-------+----------------+-------------------+  Subclavian 126              Multiphasic, WNL                      +----------+--------+-------+----------------+-------------------+ +---------+--------+--+--------+--+---------+  Vertebral PSV cm/s 42 EDV cm/s 12 Antegrade  +---------+--------+--+--------+--+---------+  Left Carotid Findings: +----------+--------+--------+--------+------------------+--------+             PSV cm/s EDV  cm/s Stenosis Plaque Description Comments  +----------+--------+--------+--------+------------------+--------+  CCA Prox   71       14                                             +----------+--------+--------+--------+------------------+--------+  CCA Distal 83       13                                             +----------+--------+--------+--------+------------------+--------+  ICA Prox   48       9        Normal                                +----------+--------+--------+--------+------------------+--------+  ICA Mid    48       11       Normal                                +----------+--------+--------+--------+------------------+--------+  ICA Distal 59       15                                             +----------+--------+--------+--------+------------------+--------+  ECA        123      7                                              +----------+--------+--------+--------+------------------+--------+ +----------+--------+--------+----------------+-------------------+             PSV cm/s EDV cm/s Describe         Arm Pressure (mmHG)  +----------+--------+--------+----------------+-------------------+  Subclavian 128               Multiphasic, WNL                      +----------+--------+--------+----------------+-------------------+ +---------+--------+--+--------+-+---------+  Vertebral PSV cm/s 47 EDV cm/s 8 Antegrade  +---------+--------+--+--------+-+---------+   Summary: Right Carotid: There was no evidence of thrombus, dissection, atherosclerotic                plaque or stenosis in the cervical carotid system. Left Carotid: There is no evidence of stenosis in the left ICA. Vertebrals:  Bilateral vertebral arteries demonstrate antegrade flow. Subclavians: Normal flow hemodynamics were seen in bilateral subclavian              arteries. *See table(s) above for measurements and observations.  Electronically signed by Larae Grooms MD on 10/10/2021 at 6:23:58 PM.    Final       Labs: BNP  (last 3 results) Recent Labs    08/26/21 0750 09/13/21 1703 10/27/21 0658  BNP 1,040.2* 945.4* 5,885.0*   Basic Metabolic Panel: Recent Labs  Lab 10/27/21 0658 10/28/21 0714 10/29/21 0600 10/30/21 0420  NA 141 142 139 142  K 3.3* 3.6 3.6 3.8  CL 105 106 105 107  CO2 27 29 29 29   GLUCOSE 192* 73 112* 117*  BUN 37* 35* 31* 27*  CREATININE 1.74* 1.79* 1.77* 1.73*  CALCIUM 8.4* 8.2* 8.4* 8.5*  MG  --   --  2.0 2.1   Liver Function Tests: No results for input(s): AST, ALT, ALKPHOS, BILITOT, PROT, ALBUMIN in the last 168 hours. No results for input(s): LIPASE, AMYLASE in the last 168 hours. No results for input(s): AMMONIA in the last 168 hours. CBC: Recent Labs  Lab 10/27/21 0658 10/29/21 0600 10/30/21 0420  WBC 9.6 4.6 4.9  HGB 13.4 12.0* 12.5*  HCT 44.4 37.9* 39.2  MCV 88.6 85.2 85.2  PLT 255 206 234   Cardiac Enzymes: No results for input(s): CKTOTAL, CKMB, CKMBINDEX, TROPONINI in the last 168 hours. BNP: Invalid input(s): POCBNP CBG: Recent Labs  Lab 10/29/21 0735 10/29/21 1142 10/29/21 1635 10/29/21 2021 10/30/21 0754  GLUCAP 126* 157* 150* 155* 110*   D-Dimer No results for input(s): DDIMER in the last 72 hours. Hgb A1c Recent Labs    10/28/21  0714  HGBA1C 6.8*   Lipid Profile No results for input(s): CHOL, HDL, LDLCALC, TRIG, CHOLHDL, LDLDIRECT in the last 72 hours. Thyroid function studies No results for input(s): TSH, T4TOTAL, T3FREE, THYROIDAB in the last 72 hours.  Invalid input(s): FREET3 Anemia work up No results for input(s): VITAMINB12, FOLATE, FERRITIN, TIBC, IRON, RETICCTPCT in the last 72 hours. Urinalysis    Component Value Date/Time   COLORURINE YELLOW 09/01/2020 2035   APPEARANCEUR HAZY (A) 09/01/2020 2035   LABSPEC 1.010 09/01/2020 2035   PHURINE 5.0 09/01/2020 2035   GLUCOSEU NEGATIVE 09/01/2020 2035   HGBUR MODERATE (A) 09/01/2020 2035   BILIRUBINUR NEGATIVE 09/01/2020 2035   KETONESUR NEGATIVE 09/01/2020 2035    PROTEINUR NEGATIVE 09/01/2020 2035   NITRITE NEGATIVE 09/01/2020 2035   LEUKOCYTESUR TRACE (A) 09/01/2020 2035   Sepsis Labs Invalid input(s): PROCALCITONIN,  WBC,  LACTICIDVEN Microbiology Recent Results (from the past 240 hour(s))  Resp Panel by RT-PCR (Flu A&B, Covid) Nasopharyngeal Swab     Status: None   Collection Time: 10/27/21  6:59 AM   Specimen: Nasopharyngeal Swab; Nasopharyngeal(NP) swabs in vial transport medium  Result Value Ref Range Status   SARS Coronavirus 2 by RT PCR NEGATIVE NEGATIVE Final    Comment: (NOTE) SARS-CoV-2 target nucleic acids are NOT DETECTED.  The SARS-CoV-2 RNA is generally detectable in upper respiratory specimens during the acute phase of infection. The lowest concentration of SARS-CoV-2 viral copies this assay can detect is 138 copies/mL. A negative result does not preclude SARS-Cov-2 infection and should not be used as the sole basis for treatment or other patient management decisions. A negative result may occur with  improper specimen collection/handling, submission of specimen other than nasopharyngeal swab, presence of viral mutation(s) within the areas targeted by this assay, and inadequate number of viral copies(<138 copies/mL). A negative result must be combined with clinical observations, patient history, and epidemiological information. The expected result is Negative.  Fact Sheet for Patients:  EntrepreneurPulse.com.au  Fact Sheet for Healthcare Providers:  IncredibleEmployment.be  This test is no t yet approved or cleared by the [place] FDA and  has been authorized for detection and/or diagnosis of SARS-CoV-2 by FDA under an Emergency Use Authorization (EUA). This EUA will remain  in effect (meaning this test can be used) for the duration of the COVID-19 declaration under Section 564(b)(1) of the Act, 21 U.S.C.section 360bbb-3(b)(1), unless the authorization is terminated  or revoked  sooner.       Influenza A by PCR NEGATIVE NEGATIVE Final   Influenza B by PCR NEGATIVE NEGATIVE Final    Comment: (NOTE) The Xpert Xpress SARS-CoV-2/FLU/RSV plus assay is intended as an aid in the diagnosis of influenza from Nasopharyngeal swab specimens and should not be used as a sole basis for treatment. Nasal washings and aspirates are unacceptable for Xpert Xpress SARS-CoV-2/FLU/RSV testing.  Fact Sheet for Patients: EntrepreneurPulse.com.au  Fact Sheet for Healthcare Providers: IncredibleEmployment.be  This test is not yet approved or cleared by the [place] FDA and has been authorized for detection and/or diagnosis of SARS-CoV-2 by FDA under an Emergency Use Authorization (EUA). This EUA will remain in effect (meaning this test can be used) for the duration of the COVID-19 declaration under Section 564(b)(1) of the Act, 21 U.S.C. section 360bbb-3(b)(1), unless the authorization is terminated or revoked.  Performed at [facility], [address] [place], [place] [address]      Total time spend on discharging this patient, including the last patient exam, discussing the  hospital stay, instructions for ongoing care as it relates to all pertinent caregivers, as well as preparing the medical discharge records, prescriptions, and/or referrals as applicable, is 40 minutes.    Enzo Bi, MD  [facility] 10/30/2021, 9:45 AM

## 2021-10-30 NOTE — Progress Notes (Addendum)
Progress Note  Patient Name: [name] Date of Encounter: 10/30/2021  [facility] Cardiologist: [name], MD   Subjective   Euvolemic on exam. Can likely go home.  Inpatient Medications    Scheduled Meds:  acyclovir  800 mg Oral Daily   carvedilol  3.125 mg Oral BID WC   clopidogrel  75 mg Oral Daily   enoxaparin (LOVENOX) injection  0.5 mg/kg Subcutaneous Q24H   hydrALAZINE  25 mg Oral TID   insulin aspart  0-15 Units Subcutaneous TID WC   insulin detemir  12 Units Subcutaneous QHS   isosorbide mononitrate  30 mg Oral Daily   oxybutynin  5 mg Oral BID   pantoprazole  40 mg Oral BID   potassium chloride  40 mEq Oral Daily   rosuvastatin  40 mg Oral Daily   sodium chloride flush  3 mL Intravenous Q12H   Continuous Infusions:  sodium chloride     PRN Meds: sodium chloride, acetaminophen, albuterol, ondansetron (ZOFRAN) IV, sodium chloride flush, traMADol   Vital Signs    Vitals:   10/30/21 0037 10/30/21 0443 10/30/21 0514 10/30/21 0753  BP: 131/67  (!) 145/82 123/84  Pulse: 61  60 65  Resp: 18  18 18   Temp: 97.8 F (36.6 C)  97.9 F (36.6 C) 98.6 F (37 C)  TempSrc:      SpO2: 99%  97% 99%  Weight:  103 kg      Intake/Output Summary (Last 24 hours) at 10/30/2021 1014 Last data filed at 10/30/2021 0500 Gross per 24 hour  Intake 240 ml  Output 3300 ml  Net -3060 ml   Last 3 Weights 10/30/2021 10/29/2021 10/28/2021  Weight (lbs) 227 lb 1.6 oz 229 lb 6.4 oz 232 lb 2.3 oz  Weight (kg) 103.012 kg 104.055 kg 105.3 kg      Telemetry    AV paced rhythm - Personally Reviewed  ECG    No ne - Personally Reviewed  Physical Exam   GEN: No acute distress.   Neck: No JVD Cardiac: RRR, no murmurs, rubs, or gallops.  Respiratory: Clear to auscultation bilaterally. GI: Soft, nontender, non-distended  MS: No edema; No deformity. Neuro:  Nonfocal  Psych: Normal affect   Labs    High Sensitivity Troponin:   Recent Labs  Lab 10/27/21 0658  10/27/21 0936  TROPONINIHS 31* 32*     Chemistry Recent Labs  Lab 10/28/21 0714 10/29/21 0600 10/30/21 0420  NA 142 139 142  K 3.6 3.6 3.8  CL 106 105 107  CO2 29 29 29   GLUCOSE 73 112* 117*  BUN 35* 31* 27*  CREATININE 1.79* 1.77* 1.73*  CALCIUM 8.2* 8.4* 8.5*  MG  --  2.0 2.1  GFRNONAA 37* 37* 38*  ANIONGAP 7 5 6     Lipids No results for input(s): CHOL, TRIG, HDL, LABVLDL, LDLCALC, CHOLHDL in the last 168 hours.  Hematology Recent Labs  Lab 10/27/21 0658 10/29/21 0600 10/30/21 0420  WBC 9.6 4.6 4.9  RBC 5.01 4.45 4.60  HGB 13.4 12.0* 12.5*  HCT 44.4 37.9* 39.2  MCV 88.6 85.2 85.2  MCH 26.7 27.0 27.2  MCHC 30.2 31.7 31.9  RDW 15.5 15.4 15.4  PLT 255 206 234   Thyroid No results for input(s): TSH, FREET4 in the last 168 hours.  BNP Recent Labs  Lab 10/27/21 0658  BNP 1,801.4*    DDimer No results for input(s): DDIMER in the last 168 hours.   Radiology    No results  found.  Cardiac Studies   Cardiac catheterization 1.  Severe underlying three-vessel coronary artery disease with patent grafts including LIMA to LAD, SVG to large diagonal, sequential SVG to OM /distal left circumflex and SVG to right PDA. 2.  Right heart catheterization showed normal right and left-sided filling pressures, minimal pulmonary hypertension and normal cardiac output.   Recommendations: Continue medical therapy for coronary artery disease and congestive heart failure. Volume status appears optimal at this time  Patient Profile     86 y.o. 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, trifascicular block s/p CRT-P, and stage III chronic kidney disease, who is being seen today for the evaluation of chest pain and recurrent CHF.  Assessment & Plan    Acute on chronic  combined systolic and diastolic CHF/ICM - BNP elevated 1881, CXR with pulmonary vascular congestion - suspect medication and diet noncompliance - IV lasix 60mg  BID - good UOP, net -7.6L - continue hydralazine and Imdur - continue Coreg  - appears euvolemic on exam - CKD limiting GDMT, kidney function stable - change to PTA lasix 80mg  daily with metolazone 2.5mg  twice a week.     CAD elevated troponin - s/p VF arrest in October 2021 with subsequent finding of severe multivessel CAD s/p CABGx5 - Cath in October 2022 showed 5/5 patent grafts with severe native CAD - HS trop minimally elevated with downward trend - no plan for ischemic worm-up.  - low dose BB added - continue Plavix and statin   CHB/trifasicular block - s/p CRT-P   HTN - Hydralazine and Imdur - Coreg added as above - Bps good   HLD - LDL 53 in October - continue statin   For questions or updates, please contact [facility] Please consult [email or web address] for contact info under        Signed, Chequita Mofield Ninfa Meeker, PA-C  10/30/2021, 10:14 AM

## 2021-11-12 ENCOUNTER — Encounter: Payer: Self-pay | Admitting: Cardiovascular Disease

## 2021-11-12 ENCOUNTER — Other Ambulatory Visit: Payer: Self-pay

## 2021-11-12 ENCOUNTER — Ambulatory Visit (INDEPENDENT_AMBULATORY_CARE_PROVIDER_SITE_OTHER): Payer: Medicare HMO | Admitting: Cardiovascular Disease

## 2021-11-12 VITALS — BP 132/60 | HR 77 | Ht 68.0 in | Wt 222.5 lb

## 2021-11-12 DIAGNOSIS — E785 Hyperlipidemia, unspecified: Secondary | ICD-10-CM | POA: Diagnosis not present

## 2021-11-12 DIAGNOSIS — I4901 Ventricular fibrillation: Secondary | ICD-10-CM | POA: Diagnosis not present

## 2021-11-12 DIAGNOSIS — I5022 Chronic systolic (congestive) heart failure: Secondary | ICD-10-CM | POA: Diagnosis not present

## 2021-11-12 DIAGNOSIS — N189 Chronic kidney disease, unspecified: Secondary | ICD-10-CM

## 2021-11-12 DIAGNOSIS — I2510 Atherosclerotic heart disease of native coronary artery without angina pectoris: Secondary | ICD-10-CM

## 2021-11-12 DIAGNOSIS — I10 Essential (primary) hypertension: Secondary | ICD-10-CM

## 2021-11-12 MED ORDER — METOLAZONE 2.5 MG PO TABS: ORAL_TABLET | ORAL | 1 refills | Status: DC

## 2021-11-12 MED ORDER — CARVEDILOL 3.125 MG PO TABS: 3.1250 mg | ORAL_TABLET | Freq: Two times a day (BID) | ORAL | 3 refills | Status: DC

## 2021-11-12 NOTE — Patient Instructions (Signed)
Medication Instructions:  Your physician has recommended you make the following change in your medication:   REDUCE Metolazone to 2.5 mg once a week on Tuesdays  *If you need a refill on your cardiac medications before your next appointment, please call your pharmacy*   Lab Work: None ordered If you have labs (blood work) drawn today and your tests are completely normal, you will receive your results only by: [place] (if you have MyChart) OR A paper copy in the mail If you have any lab test that is abnormal or we need to change your treatment, we will call you to review the results.   Testing/Procedures: None ordered   Follow-Up: At [facility], you and your health needs are our priority.  As part of our continuing mission to provide you with exceptional heart care, we have created designated Provider Care Teams.  These Care Teams include your primary Cardiologist (physician) and Advanced Practice Providers (APPs -  Physician Assistants and Nurse Practitioners) who all work together to provide you with the care you need, when you need it.  We recommend signing up for the patient portal called "MyChart".  Sign up information is provided on this After Visit Summary.  MyChart is used to connect with patients for Virtual Visits (Telemedicine).  Patients are able to view lab/test results, encounter notes, upcoming appointments, etc.  Non-urgent messages can be sent to your provider as well.   To learn more about what you can do with MyChart, go to [email or web address].    Your next appointment:   4 week(s)  The format for your next appointment:   In Person  Provider:   You will see one of the following Advanced Practice Providers on your designated Care Team:   Murray Hodgkins, NP Christell Faith, PA-C Cadence Kathlen Mody, [place]    Other Instructions N/A
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
## 2021-11-17 ENCOUNTER — Ambulatory Visit: Payer: Medicaid Other | Admitting: Family

## 2021-11-23 NOTE — Progress Notes (Signed)
Patient ID: Johathan Province, male    DOB: 14-May-1935, 87 y.o.   MRN: [id number]  HPI  Mr Indelicato is a 87 y/o male with a history of heart block, CAD, DM, hyperlipidemia, HTN, pacemaker implantation, CKD, previous tobacco use and chronic heart failure.   Echo report from 08/27/21 reviewed and showed an EF of 35-40% along with mild LAE.   RHC/LHC done 08/31/21 & showed: Severe underlying three-vessel coronary artery disease with patent grafts including LIMA to LAD, SVG to large diagonal, sequential SVG to OM /distal left circumflex and SVG to right PDA. 2.  Right heart catheterization showed normal right and left-sided filling pressures, minimal pulmonary hypertension and normal cardiac output.  Admitted 10/27/21 due to chest pain and shortness of breath. Initially given IV lasix with transition to oral diuretics. Cardiology consult obtained. Discharged after 3 days. Admitted 10/12/21 due to getting pacemaker implanted. Discharged the next day. Was in the ED 09/13/21 due to acute on chronic HF where he was evaluated and released.   He presents today for a follow-up visit with a chief complaint of minimal shortness of breath upon moderate exertion. He describes this as chronic in nature having been present for several years although has improved since he was last here. He has associated fatigue and pedal edema along with this. He denies any difficulty sleeping, dizziness, abdominal distention, palpitations, chest pain, cough or weight gain.   Does not have carvedilol bottles with him and he's unsure if he has been taking it.   Past Medical History:  Diagnosis Date   AV block, Mobitz 1    CAD (coronary artery disease)    a. 08/2019 VF arrest-->sev 3vd on cath-->CABGx5 (LIMA->LAD, VG->OM->LCX, VG->RPDA, VG->Diag); b. 08/2021 Cath: sev native multivessel dzs w/ 5/5 patent grafts. Nl filling pressures->Med rx.   CKD (chronic kidney disease), stage III (HCC)    Diabetes mellitus without complication  ([place])    HFrEF (heart failure with reduced ejection fraction) ([place])    a. 08/2020 Echo: EF 40-45%; b. 11/2020 Echo: EF 55%, no rwma, mild LVH, Gr2 DD. nl RV fxn; c. 08/2021 Echo: EF 35-40%, glob HK w/ ant/antsept/apical HK. Reduced RV fxn. Mildly dil LA; d. 10/2021 s/p CRT-P.   Hyperlipidemia LDL goal <70    Hypertension    Intermittent Complete heart block ([place])    a. 08/2021 noted on Zio; b. 10/2021 s/p Abbott Allure RF CRT-P (Ser # [id number]).   Ischemic cardiomyopathy    a. 08/2020 Echo: EF 40-45%; b. 11/2020 Echo: EF 55%; c. 08/2021 Echo: EF 35-40%; d. 10/2021 s/p CRT-P.   Trifascicular block    Past Surgical History:  Procedure Laterality Date   BACK SURGERY     CARDIAC CATHETERIZATION     CLIPPING OF ATRIAL APPENDAGE N/A 08/29/2020   Procedure: CLIPPING OF ATRIAL APPENDAGE USING ATRICURE 32 MM ATRICLIP FLEX-V;  Surgeon: Grace Isaac, MD;  Location: [place];  Service: Open Heart Surgery;  Laterality: N/A;   CORONARY ARTERY BYPASS GRAFT N/A 08/29/2020   Procedure: CORONARY ARTERY BYPASS GRAFTING (CABG) X 5 USING LEFT INTERNAL MAMMARY ARTERY AND ENDOSCOPICALLY HARVESTED RIGHT GREATER SAPHENOUS VEIN. LIMA TO LAD, SVG TO OM SEQ TO CIRC, SVG TO PD, SVG TO DIAG.;  Surgeon: Grace Isaac, MD;  Location: [place];  Service: Open Heart Surgery;  Laterality: N/A;   ENDOVEIN HARVEST OF GREATER SAPHENOUS VEIN Right 08/29/2020   Procedure: ENDOVEIN HARVEST OF GREATER SAPHENOUS VEIN;  Surgeon: Grace Isaac, MD;  Location: [place];  Service: Open Heart Surgery;  Laterality: Right;   HERNIA REPAIR     LEFT HEART CATH AND CORONARY ANGIOGRAPHY N/A 08/25/2020   Procedure: LEFT HEART CATH AND CORONARY ANGIOGRAPHY;  Surgeon: [name], MD;  Location: [place] [facility];  Service: Cardiovascular;  Laterality: N/A;   PACEMAKER IMPLANT N/A 10/12/2021   Procedure: PACEMAKER IMPLANT;  Surgeon: Vickie Epley, MD;  Location: [place] [facility];  Service: Cardiovascular;  Laterality: N/A;    RIGHT/LEFT HEART CATH AND CORONARY ANGIOGRAPHY N/A 08/31/2021   Procedure: RIGHT/LEFT HEART CATH AND CORONARY ANGIOGRAPHY;  Surgeon: [name], MD;  Location: [place] [facility];  Service: Cardiovascular;  Laterality: N/A;   TEE WITHOUT CARDIOVERSION N/A 08/29/2020   Procedure: TRANSESOPHAGEAL ECHOCARDIOGRAM (TEE);  Surgeon: Grace Isaac, MD;  Location: [place];  Service: Open Heart Surgery;  Laterality: N/A;    Family History  Problem Relation Age of Onset   Heart attack Mother    Social History   Tobacco Use   Smoking status: Former    Types: Cigarettes    Quit date: 07/26/1996    Years since quitting: 25.3   Smokeless tobacco: Never  Substance Use Topics   Alcohol use: Not Currently   Allergies  Allergen Reactions   Angiotensin Receptor Blockers     hyperkalemia   Metformin Diarrhea   Spironolactone     Hyperkalemia    Prior to Admission medications   Medication Sig Start Date End Date Taking? Authorizing Provider  acetaminophen (TYLENOL) 500 MG tablet Take 500-1,000 mg by mouth every 6 (six) hours as needed for mild pain or moderate pain.    Yes [provider]  acyclovir (ZOVIRAX) 800 MG tablet Take 800 mg by mouth daily. 12/02/20  Yes [provider]  clopidogrel (PLAVIX) 75 MG tablet TAKE 1 TABLET BY MOUTH EVERY DAY 05/17/21  Yes Loel Dubonnet, NP  fluticasone (FLOVENT HFA) 44 MCG/ACT inhaler Inhale 2 puffs into the lungs in the morning and at bedtime.   Yes [provider]  furosemide (LASIX) 80 MG tablet Take 1 tablet (80 mg total) by mouth daily. 10/30/21 01/28/22 Yes Enzo Bi, MD  hydrALAZINE (APRESOLINE) 25 MG tablet Take 1 tablet (25 mg total) by mouth 3 (three) times daily. 10/30/21 01/28/22 Yes Enzo Bi, MD  insulin detemir (LEVEMIR) 100 UNIT/ML injection Inject 0.15 mLs (15 Units total) into the skin at bedtime. 10/30/21 01/28/22 Yes Enzo Bi, MD  magnesium oxide (MAG-OX) 400 MG tablet TAKE 1 TABLET BY MOUTH EVERY DAY  06/01/21  Yes [name], MD  Multiple Vitamin (MULTIVITAMIN WITH MINERALS) TABS tablet Take 1 tablet by mouth daily. 09/11/20  Yes Gold, Wayne E, PA-C  pantoprazole (PROTONIX) 40 MG tablet Take 40 mg by mouth 2 (two) times daily.   Yes [provider]  rosuvastatin (CRESTOR) 40 MG tablet Take 1 tablet (40 mg total) by mouth daily. 06/17/21  Yes [name], MD  albuterol (VENTOLIN HFA) 108 (90 Base) MCG/ACT inhaler Inhale 2 puffs into the lungs every 6 (six) hours as needed for wheezing or shortness of breath. Patient not taking: Reported on 11/25/2021 09/30/21   [provider]  carvedilol (COREG) 3.125 MG tablet Take 1 tablet (3.125 mg total) by mouth 2 (two) times daily with a meal. Patient not taking: Reported on 11/25/2021 11/12/21 02/10/22  [name], MD  isosorbide mononitrate (IMDUR) 30 MG 24 hr tablet Take 1 tablet (30 mg total) by mouth daily. 11/25/21 02/23/22  Ger Ringenberg,  Aura Fey, FNP  metolazone (ZAROXOLYN) 2.5 MG tablet Take 1 tablet (2.5) mg 1 hour before Lasix weekly on Tuesdays 11/25/21   Alisa Graff, FNP   Review of Systems  Constitutional:  Positive for fatigue. Negative for appetite change.  HENT:  Negative for congestion, postnasal drip and sore throat.   Eyes: Negative.   Respiratory:  Positive for shortness of breath ("improving"). Negative for cough and chest tightness.   Cardiovascular:  Positive for leg swelling (improving). Negative for chest pain and palpitations.  Gastrointestinal:  Negative for abdominal distention and abdominal pain.  Endocrine: Negative.   Genitourinary: Negative.   Musculoskeletal:  Negative for back pain and neck pain.  Skin: Negative.   Allergic/Immunologic: Negative.   Neurological:  Negative for dizziness and light-headedness.  Hematological:  Negative for adenopathy. Does not bruise/bleed easily.  Psychiatric/Behavioral:  Negative for dysphoric mood and sleep disturbance (sleeping on 1 pillows). The patient is  not nervous/anxious.    Vitals:   11/25/21 1117  BP: (!) 134/95  Pulse: (!) 45  Resp: 18  SpO2: 100%  Weight: 230 lb 6 oz (104.5 kg)  Height: 5\' 8"  (1.727 m)   Wt Readings from Last 3 Encounters:  11/25/21 230 lb 6 oz (104.5 kg)  11/12/21 222 lb 8 oz (100.9 kg)  10/30/21 227 lb 1.6 oz (103 kg)   Lab Results  Component Value Date   CREATININE 1.73 (H) 10/30/2021   CREATININE 1.77 (H) 10/29/2021   CREATININE 1.79 (H) 10/28/2021   Physical Exam Vitals and nursing note reviewed.  Constitutional:      Appearance: He is well-developed.     Interventions: He is not intubated. HENT:     Head: Normocephalic and atraumatic.  Neck:     Vascular: No JVD.  Cardiovascular:     Rate and Rhythm: Bradycardia present. Rhythm irregular.  Pulmonary:     Effort: Pulmonary effort is normal. No tachypnea or accessory muscle usage. He is not intubated.  Abdominal:     Palpations: Abdomen is soft.     Tenderness: There is no abdominal tenderness.  Musculoskeletal:     Cervical back: Neck supple.     Right lower leg: No tenderness. Edema (trace pitting) present.     Left lower leg: No tenderness. Edema (trace pitting) present.  Skin:    General: Skin is warm and dry.  Neurological:     General: No focal deficit present.     Mental Status: He is alert and oriented to person, place, and time.  Psychiatric:        Mood and Affect: Mood normal.        Behavior: Behavior normal.    Assessment & Plan:  1: Chronic heart failure with reduced ejection fraction- - NYHA class II - euvolemic today - weighing daily; reminded to call for an overnight weight gain of >2 pounds or a weekly weight gain of > 5 pounds - weight down 7 pounds from last visit here 2 months ago - not adding salt and he says that his wife doesn't cook with salt either - had pacemaker implanted 10/12/21 - had hyperkalemia with spironolactone - patient to go home and see if carvedilol bottle is there; pharmacy was called and  stated someone picked up the RX end of December; if he can't find it, instructed to call Dr. Tyrell Antonio office - taking metolazone weekly - BNP 10/27/21 was 1801.4 - PharmD reconciled medications with the patient  2: HTN with CKD- - BP mildly elevated (  134/95) - saw PCP 11/17/21 - BMP 11/04/21 reviewed and showed sodium 143, potassium 3.9, creatinine 2.13 and GFR 30  3: Type 2 DM- - A1c 10/28/21 was 6.8%  4: CAD- - saw cardiology Fletcher Anon) 11/12/21  5: Lymphedema-  - stage 2 - tries to elevate legs when sitting for long periods of time - wearing compression socks with improvement of swelling - consider compression boots if edema worsens   Mediation bottles reviewed.   Return in 3 months or sooner for any questions/problems before then.

## 2021-11-25 ENCOUNTER — Other Ambulatory Visit: Payer: Self-pay

## 2021-11-25 ENCOUNTER — Ambulatory Visit: Payer: Medicare HMO | Attending: Family | Admitting: Family

## 2021-11-25 ENCOUNTER — Encounter: Payer: Self-pay | Admitting: Pharmacist

## 2021-11-25 ENCOUNTER — Encounter: Payer: Self-pay | Admitting: Family

## 2021-11-25 VITALS — BP 134/95 | HR 45 | Resp 18 | Ht 68.0 in | Wt 230.4 lb

## 2021-11-25 DIAGNOSIS — Z87891 Personal history of nicotine dependence: Secondary | ICD-10-CM | POA: Diagnosis not present

## 2021-11-25 DIAGNOSIS — N183 Chronic kidney disease, stage 3 unspecified: Secondary | ICD-10-CM | POA: Insufficient documentation

## 2021-11-25 DIAGNOSIS — E785 Hyperlipidemia, unspecified: Secondary | ICD-10-CM | POA: Insufficient documentation

## 2021-11-25 DIAGNOSIS — I130 Hypertensive heart and chronic kidney disease with heart failure and stage 1 through stage 4 chronic kidney disease, or unspecified chronic kidney disease: Secondary | ICD-10-CM | POA: Diagnosis not present

## 2021-11-25 DIAGNOSIS — I890 Lymphedema, not elsewhere classified: Secondary | ICD-10-CM | POA: Diagnosis not present

## 2021-11-25 DIAGNOSIS — I5022 Chronic systolic (congestive) heart failure: Secondary | ICD-10-CM | POA: Insufficient documentation

## 2021-11-25 DIAGNOSIS — I2510 Atherosclerotic heart disease of native coronary artery without angina pectoris: Secondary | ICD-10-CM | POA: Diagnosis not present

## 2021-11-25 DIAGNOSIS — E1122 Type 2 diabetes mellitus with diabetic chronic kidney disease: Secondary | ICD-10-CM | POA: Diagnosis not present

## 2021-11-25 DIAGNOSIS — Z950 Presence of cardiac pacemaker: Secondary | ICD-10-CM | POA: Insufficient documentation

## 2021-11-25 DIAGNOSIS — N1832 Chronic kidney disease, stage 3b: Secondary | ICD-10-CM

## 2021-11-25 DIAGNOSIS — I440 Atrioventricular block, first degree: Secondary | ICD-10-CM | POA: Diagnosis not present

## 2021-11-25 DIAGNOSIS — I10 Essential (primary) hypertension: Secondary | ICD-10-CM

## 2021-11-25 DIAGNOSIS — Z794 Long term (current) use of insulin: Secondary | ICD-10-CM

## 2021-11-25 MED ORDER — METOLAZONE 2.5 MG PO TABS: ORAL_TABLET | ORAL | 3 refills | Status: DC

## 2021-11-25 MED ORDER — ISOSORBIDE MONONITRATE ER 30 MG PO TB24: 30.0000 mg | ORAL_TABLET | Freq: Every day | ORAL | 3 refills | Status: DC

## 2021-11-25 NOTE — Patient Instructions (Addendum)
Continue weighing daily and call for an overnight weight gain of 3 pounds or more or a weekly weight gain of more than 5 pounds.     Check and see if you have carvedilol (coreg) bottle at home. If you don't, followup with Dr Fletcher Anon

## 2021-11-25 NOTE — Progress Notes (Signed)
[facility] - PHARMACIST COUNSELING NOTE  Guideline-Directed Medical Therapy/Evidence Based Medicine  ACE/ARB/ARNI:  none - caused hyperkalemia in the past ; hydralazine/isosorbide Beta Blocker: Carvedilol 3.125 mg twice daily Aldosterone Antagonist:  none- hyperkalemia in the past Diuretic: Furosemide 80 mg daily plus metolazone 2.5mg  every Tuesday SGLT2i:  none  Adherence Assessment  Do you ever forget to take your medication? [] Yes [x] No  Do you ever skip doses due to side effects? [] Yes [x] No  Do you have trouble affording your medicines? [] Yes [x] No  Are you ever unable to pick up your medication due to transportation difficulties? [] Yes [x] No  Do you ever stop taking your medications because you don't believe they are helping? [] Yes [x] No  Do you check your weight daily? [x] Yes [] No   Adherence strategy: pill box  Barriers to obtaining medications: none  Diet: avoid adding salt to food  Vital signs: HR 45, BP 134/95 , weight (pounds) 230 lbs (gain 8 lbs)  ECHO: Date 08/27/21, EF 35-40%, The left ventricle has moderately decreased function. The left ventricle demonstrates global hypokinesis. Concern for anterior, anteroseptal and apical hypokinesis. The left ventricular  internal cavity size was mildly dilated   BMP Latest Ref Rng & Units 10/30/2021 10/29/2021 10/28/2021  Glucose 70 - 99 mg/dL 117(H) 112(H) 73  BUN 8 - 23 mg/dL 27(H) 31(H) 35(H)  Creatinine 0.61 - 1.24 mg/dL 1.73(H) 1.77(H) 1.79(H)  BUN/Creat Ratio 10 - 24 - - -  Sodium 135 - 145 mmol/L 142 139 142  Potassium 3.5 - 5.1 mmol/L 3.8 3.6 3.6  Chloride 98 - 111 mmol/L 107 105 106  CO2 22 - 32 mmol/L 29 29 29   Calcium 8.9 - 10.3 mg/dL 8.5(L) 8.4(L) 8.2(L)    Past Medical History:  Diagnosis Date   AV block, Mobitz 1    CAD (coronary artery disease)    a. 08/2019 VF arrest-->sev 3vd on cath-->CABGx5 (LIMA->LAD, VG->OM->LCX, VG->RPDA, VG->Diag); b. 08/2021  Cath: sev native multivessel dzs w/ 5/5 patent grafts. Nl filling pressures->Med rx.   CKD (chronic kidney disease), stage III (HCC)    Diabetes mellitus without complication ([place])    HFrEF (heart failure with reduced ejection fraction) ([place])    a. 08/2020 Echo: EF 40-45%; b. 11/2020 Echo: EF 55%, no rwma, mild LVH, Gr2 DD. nl RV fxn; c. 08/2021 Echo: EF 35-40%, glob HK w/ ant/antsept/apical HK. Reduced RV fxn. Mildly dil LA; d. 10/2021 s/p CRT-P.   Hyperlipidemia LDL goal <70    Hypertension    Intermittent Complete heart block ([place])    a. 08/2021 noted on Zio; b. 10/2021 s/p Abbott Allure RF CRT-P (Ser # [id number]).   Ischemic cardiomyopathy    a. 08/2020 Echo: EF 40-45%; b. 11/2020 Echo: EF 55%; c. 08/2021 Echo: EF 35-40%; d. 10/2021 s/p CRT-P.   Trifascicular block     ASSESSMENT 87 year old male who presents to the HF clinic for follow up. PMH includes CAD, HFrEF, CKD, DM, HTN, and HLD. Patient reports SOB greatly improved and swelling LEE stable. Continues to use compression stockings.   Last visit with cardiologist (Dr. Fletcher Anon) was 11/12/2021. Metolazone was decreased from 2 times weekly to 1 time weekly. Patient unsure if taking carvedilol or not. Carvedilol not on his pill bag, but 90 supply was picked up from prefer pharmacy on 10/31/2021 (per call to [facility]).  Recent ED Visit (past 6 months): Date - 10/27/21, [facility] - Dyspnea/Acute HF exacerbation   PLAN  Continue current regimen without changes  Verify if carvedilol 3.125 mg is available at home and in pill box to take twice daily. Continue with positive lifestyle modification  Time spent: 15 minutes  [name] PharmD, BCPS 11/25/2021 8:40 AM   Current Outpatient Medications:    acetaminophen (TYLENOL) 500 MG tablet, Take 500-1,000 mg by mouth every 6 (six) hours as needed for mild pain or moderate pain. , Disp: , Rfl:    acyclovir (ZOVIRAX) 800 MG tablet, Take 800 mg by mouth daily., Disp: , Rfl:    albuterol (VENTOLIN  HFA) 108 (90 Base) MCG/ACT inhaler, Inhale 2 puffs into the lungs every 6 (six) hours as needed for wheezing or shortness of breath., Disp: , Rfl:    carvedilol (COREG) 3.125 MG tablet, Take 1 tablet (3.125 mg total) by mouth 2 (two) times daily with a meal., Disp: 180 tablet, Rfl: 3   clopidogrel (PLAVIX) 75 MG tablet, TAKE 1 TABLET BY MOUTH EVERY DAY, Disp: 90 tablet, Rfl: 1   fluticasone (FLOVENT HFA) 44 MCG/ACT inhaler, Inhale 2 puffs into the lungs in the morning and at bedtime., Disp: , Rfl:    furosemide (LASIX) 80 MG tablet, Take 1 tablet (80 mg total) by mouth daily., Disp: 90 tablet, Rfl: 0   hydrALAZINE (APRESOLINE) 25 MG tablet, Take 1 tablet (25 mg total) by mouth 3 (three) times daily., Disp: 270 tablet, Rfl: 0   insulin detemir (LEVEMIR) 100 UNIT/ML injection, Inject 0.15 mLs (15 Units total) into the skin at bedtime., Disp: 13.5 mL, Rfl: 0   isosorbide mononitrate (IMDUR) 30 MG 24 hr tablet, Take 1 tablet (30 mg total) by mouth daily., Disp: 90 tablet, Rfl: 0   magnesium oxide (MAG-OX) 400 MG tablet, TAKE 1 TABLET BY MOUTH EVERY DAY, Disp: 90 tablet, Rfl: 1   metolazone (ZAROXOLYN) 2.5 MG tablet, Take 1 tablet (2.5) mg 1 hour before Lasix weekly on Tuesdays, Disp: 12 tablet, Rfl: 1   Multiple Vitamin (MULTIVITAMIN WITH MINERALS) TABS tablet, Take 1 tablet by mouth daily., Disp: , Rfl:    oxybutynin (DITROPAN) 5 MG tablet, Take 5 mg by mouth 2 (two) times daily., Disp: , Rfl:    pantoprazole (PROTONIX) 40 MG tablet, Take 40 mg by mouth 2 (two) times daily., Disp: , Rfl:    rosuvastatin (CRESTOR) 40 MG tablet, Take 1 tablet (40 mg total) by mouth daily., Disp: 90 tablet, Rfl: 1   COUNSELING POINTS/CLINICAL PEARLS   DRUGS TO CAUTION IN HEART FAILURE  Drug or Class Mechanism  Analgesics NSAIDs COX-2 inhibitors Glucocorticoids  Sodium and water retention, increased systemic vascular resistance, decreased response to diuretics   Diabetes  Medications Metformin Thiazolidinediones Rosiglitazone (Avandia) Pioglitazone (Actos) DPP4 Inhibitors Saxagliptin (Onglyza) Sitagliptin (Januvia)   Lactic acidosis Possible calcium channel blockade   Unknown  Antiarrhythmics Class I  Flecainide Disopyramide Class III Sotalol Other Dronedarone  Negative inotrope, proarrhythmic   Proarrhythmic, beta blockade  Negative inotrope  Antihypertensives Alpha Blockers Doxazosin Calcium Channel Blockers Diltiazem Verapamil Nifedipine Central Alpha Adrenergics Moxonidine Peripheral Vasodilators Minoxidil  Increases renin and aldosterone  Negative inotrope    Possible sympathetic withdrawal  Unknown  Anti-infective Itraconazole Amphotericin B  Negative inotrope Unknown  Hematologic Anagrelide Cilostazol   Possible inhibition of PD IV Inhibition of PD III causing arrhythmias  Neurologic/Psychiatric Stimulants Anti-Seizure Drugs Carbamazepine Pregabalin Antidepressants Tricyclics Citalopram Parkinsons Bromocriptine Pergolide Pramipexole Antipsychotics Clozapine Antimigraine Ergotamine Methysergide Appetite suppressants Bipolar Lithium  Peripheral alpha and beta agonist activity  Negative inotrope and chronotrope Calcium channel blockade  Negative inotrope, proarrhythmic Dose-dependent QT  prolongation  Excessive serotonin activity/valvular damage Excessive serotonin activity/valvular damage Unknown  IgE mediated hypersensitivy, calcium channel blockade  Excessive serotonin activity/valvular damage Excessive serotonin activity/valvular damage Valvular damage  Direct myofibrillar degeneration, adrenergic stimulation  Antimalarials Chloroquine Hydroxychloroquine Intracellular inhibition of lysosomal enzymes  Urologic Agents Alpha Blockers Doxazosin Prazosin Tamsulosin Terazosin  Increased renin and aldosterone  Adapted from Page Carleene Overlie, et al. Drugs That May Cause or Exacerbate  Heart Failure: A Scientific Statement from the American Heart  Association. Circulation 2016; 134:e32-e69. DOI: 10.1161/CIR.0000000000000426   MEDICATION ADHERENCES TIPS AND STRATEGIES Taking medication as prescribed improves patient outcomes in heart failure (reduces hospitalizations, improves symptoms, increases survival) Side effects of medications can be managed by decreasing doses, switching agents, stopping drugs, or adding additional therapy. Please let someone in the [facility] know if you have having bothersome side effects so we can modify your regimen. Do not alter your medication regimen without talking to [place].  Medication reminders can help patients remember to take drugs on time. If you are missing or forgetting doses you can try linking behaviors, using pill boxes, or an electronic reminder like an alarm on your phone or an app. Some people can also get automated phone calls as medication reminders.

## 2021-11-27 ENCOUNTER — Other Ambulatory Visit: Payer: Self-pay | Admitting: Cardiology

## 2021-12-08 ENCOUNTER — Other Ambulatory Visit: Payer: Self-pay

## 2021-12-08 ENCOUNTER — Ambulatory Visit (INDEPENDENT_AMBULATORY_CARE_PROVIDER_SITE_OTHER): Payer: Medicare HMO | Admitting: Nurse Practitioner

## 2021-12-08 ENCOUNTER — Encounter: Payer: Self-pay | Admitting: Nurse Practitioner

## 2021-12-08 ENCOUNTER — Telehealth: Payer: Self-pay | Admitting: Nurse Practitioner

## 2021-12-08 VITALS — BP 126/80 | HR 65 | Ht 68.0 in | Wt 237.0 lb

## 2021-12-08 DIAGNOSIS — Z794 Long term (current) use of insulin: Secondary | ICD-10-CM

## 2021-12-08 DIAGNOSIS — I10 Essential (primary) hypertension: Secondary | ICD-10-CM | POA: Diagnosis not present

## 2021-12-08 DIAGNOSIS — E785 Hyperlipidemia, unspecified: Secondary | ICD-10-CM

## 2021-12-08 DIAGNOSIS — E1122 Type 2 diabetes mellitus with diabetic chronic kidney disease: Secondary | ICD-10-CM

## 2021-12-08 DIAGNOSIS — I2510 Atherosclerotic heart disease of native coronary artery without angina pectoris: Secondary | ICD-10-CM

## 2021-12-08 DIAGNOSIS — I5022 Chronic systolic (congestive) heart failure: Secondary | ICD-10-CM

## 2021-12-08 DIAGNOSIS — I453 Trifascicular block: Secondary | ICD-10-CM

## 2021-12-08 DIAGNOSIS — I442 Atrioventricular block, complete: Secondary | ICD-10-CM

## 2021-12-08 DIAGNOSIS — N183 Chronic kidney disease, stage 3 unspecified: Secondary | ICD-10-CM

## 2021-12-08 DIAGNOSIS — I255 Ischemic cardiomyopathy: Secondary | ICD-10-CM

## 2021-12-08 DIAGNOSIS — N1832 Chronic kidney disease, stage 3b: Secondary | ICD-10-CM

## 2021-12-08 NOTE — Patient Instructions (Addendum)
Medication Instructions:  Prescriptions were filled on 10/30/2021 with 90 day supply for the following:  Metolazone 1 tablet on Tuesdays Isosorbide mononitrate 30 mg once a day Carvedilol 3.125 mg twice a day  These prescriptions should last until March. Please review your bottles at home.  *If you need a refill on your cardiac medications before your next appointment, please call your pharmacy*   Lab Work: BMET today  If you have labs (blood work) drawn today and your tests are completely normal, you will receive your results only by: [place] (if you have MyChart) OR A paper copy in the mail If you have any lab test that is abnormal or we need to change your treatment, we will call you to review the results.   Testing/Procedures: None   Follow-Up: At [facility], you and your health needs are our priority.  As part of our continuing mission to provide you with exceptional heart care, we have created designated Provider Care Teams.  These Care Teams include your primary Cardiologist (physician) and Advanced Practice Providers (APPs -  Physician Assistants and Nurse Practitioners) who all work together to provide you with the care you need, when you need it.  We recommend signing up for the patient portal called "MyChart".  Sign up information is provided on this After Visit Summary.  MyChart is used to connect with patients for Virtual Visits (Telemedicine).  Patients are able to view lab/test results, encounter notes, upcoming appointments, etc.  Non-urgent messages can be sent to your provider as well.   To learn more about what you can do with MyChart, go to [email or web address].    Your next appointment:   1 month(s)  The format for your next appointment:   In Person  Provider:   [name], MD or Murray Hodgkins, NP

## 2021-12-08 NOTE — Telephone Encounter (Signed)
Spoke with patients pharmacy. They have on file last prescriptions were sent in on 10/30/21 & picked up at 10/31/21 at 3:05 pm. These prescriptions were 90 day supply so insurance will not cover additional refills at this time. Pharmacy was able to override and provide a 30 day supply at this time and will be ready for pick up after 3 pm today.   Spoke with patients wife and daughter via speaker phone. They report not picking those up on 12/23 due to his discharge that day. They also report not picking them up on 12/24 and their frustration is that he is without his medications. Reviewed how insurance works with medications. They adamantly deny picking up any medications from pharmacy. Reviewed that they can pick up prescriptions today after 3 and if they need further assistance to let us know. She verbalized understanding with no further questions at this time.    Carvedilol & Isosorbide mononitrate are on the $4 list at [facility]. The Metolazone is $17.84 at that location. These prices are in the event they still can not pick up refills after those are done.
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
## 2021-12-09 LAB — BASIC METABOLIC PANEL
BUN/Creatinine Ratio: 16 (ref 10–24)
BUN: 26 mg/dL (ref 8–27)
CO2: 21 mmol/L (ref 20–29)
Calcium: 8.3 mg/dL — ABNORMAL LOW (ref 8.6–10.2)
Chloride: 105 mmol/L (ref 96–106)
Creatinine, Ser: 1.65 mg/dL — ABNORMAL HIGH (ref 0.76–1.27)
Glucose: 182 mg/dL — ABNORMAL HIGH (ref 70–99)
Potassium: 4.1 mmol/L (ref 3.5–5.2)
Sodium: 141 mmol/L (ref 134–144)
eGFR: 41 mL/min/{1.73_m2} — ABNORMAL LOW (ref >59)

## 2021-12-14 ENCOUNTER — Other Ambulatory Visit: Payer: Self-pay | Admitting: Cardiovascular Disease

## 2022-01-04 ENCOUNTER — Inpatient Hospital Stay
Admission: EM | Admit: 2022-01-04 | Discharge: 2022-01-06 | DRG: 291 | Disposition: A | Discharge status: Home / Self Care | Payer: Medicare HMO | Attending: Internal Medicine | Admitting: Internal Medicine

## 2022-01-04 ENCOUNTER — Other Ambulatory Visit: Payer: Self-pay

## 2022-01-04 ENCOUNTER — Inpatient Hospital Stay: Admit: 2022-01-04 | Payer: Medicare HMO

## 2022-01-04 ENCOUNTER — Emergency Department: Payer: Medicare HMO

## 2022-01-04 DIAGNOSIS — I2510 Atherosclerotic heart disease of native coronary artery without angina pectoris: Secondary | ICD-10-CM | POA: Diagnosis present

## 2022-01-04 DIAGNOSIS — Z87891 Personal history of nicotine dependence: Secondary | ICD-10-CM | POA: Diagnosis not present

## 2022-01-04 DIAGNOSIS — Z951 Presence of aortocoronary bypass graft: Secondary | ICD-10-CM | POA: Diagnosis not present

## 2022-01-04 DIAGNOSIS — Z8249 Family history of ischemic heart disease and other diseases of the circulatory system: Secondary | ICD-10-CM

## 2022-01-04 DIAGNOSIS — K219 Gastro-esophageal reflux disease without esophagitis: Secondary | ICD-10-CM | POA: Diagnosis present

## 2022-01-04 DIAGNOSIS — Z8674 Personal history of sudden cardiac arrest: Secondary | ICD-10-CM

## 2022-01-04 DIAGNOSIS — Z794 Long term (current) use of insulin: Secondary | ICD-10-CM | POA: Diagnosis not present

## 2022-01-04 DIAGNOSIS — Z888 Allergy status to other drugs, medicaments and biological substances status: Secondary | ICD-10-CM | POA: Diagnosis not present

## 2022-01-04 DIAGNOSIS — Z20822 Contact with and (suspected) exposure to covid-19: Secondary | ICD-10-CM | POA: Diagnosis present

## 2022-01-04 DIAGNOSIS — I10 Essential (primary) hypertension: Secondary | ICD-10-CM | POA: Diagnosis present

## 2022-01-04 DIAGNOSIS — I5021 Acute systolic (congestive) heart failure: Secondary | ICD-10-CM | POA: Diagnosis not present

## 2022-01-04 DIAGNOSIS — E1159 Type 2 diabetes mellitus with other circulatory complications: Secondary | ICD-10-CM

## 2022-01-04 DIAGNOSIS — E1122 Type 2 diabetes mellitus with diabetic chronic kidney disease: Secondary | ICD-10-CM | POA: Diagnosis present

## 2022-01-04 DIAGNOSIS — I255 Ischemic cardiomyopathy: Secondary | ICD-10-CM | POA: Diagnosis present

## 2022-01-04 DIAGNOSIS — Z7902 Long term (current) use of antithrombotics/antiplatelets: Secondary | ICD-10-CM

## 2022-01-04 DIAGNOSIS — I130 Hypertensive heart and chronic kidney disease with heart failure and stage 1 through stage 4 chronic kidney disease, or unspecified chronic kidney disease: Secondary | ICD-10-CM | POA: Diagnosis not present

## 2022-01-04 DIAGNOSIS — I5043 Acute on chronic combined systolic (congestive) and diastolic (congestive) heart failure: Secondary | ICD-10-CM | POA: Diagnosis present

## 2022-01-04 DIAGNOSIS — E785 Hyperlipidemia, unspecified: Secondary | ICD-10-CM | POA: Diagnosis present

## 2022-01-04 DIAGNOSIS — Z79899 Other long term (current) drug therapy: Secondary | ICD-10-CM

## 2022-01-04 DIAGNOSIS — N1831 Chronic kidney disease, stage 3a: Secondary | ICD-10-CM | POA: Diagnosis present

## 2022-01-04 DIAGNOSIS — I5023 Acute on chronic systolic (congestive) heart failure: Secondary | ICD-10-CM | POA: Diagnosis present

## 2022-01-04 DIAGNOSIS — E1129 Type 2 diabetes mellitus with other diabetic kidney complication: Secondary | ICD-10-CM | POA: Diagnosis present

## 2022-01-04 LAB — BRAIN NATRIURETIC PEPTIDE: B Natriuretic Peptide: 2093.8 pg/mL — ABNORMAL HIGH (ref 0.0–100.0)

## 2022-01-04 LAB — COMPREHENSIVE METABOLIC PANEL
ALT: 42 U/L (ref 0–44)
AST: 39 U/L (ref 15–41)
Albumin: 3.3 g/dL — ABNORMAL LOW (ref 3.5–5.0)
Alkaline Phosphatase: 81 U/L (ref 38–126)
Anion gap: 10 (ref 5–15)
BUN: 32 mg/dL — ABNORMAL HIGH (ref 8–23)
CO2: 23 mmol/L (ref 22–32)
Calcium: 8.4 mg/dL — ABNORMAL LOW (ref 8.9–10.3)
Chloride: 103 mmol/L (ref 98–111)
Creatinine, Ser: 1.64 mg/dL — ABNORMAL HIGH (ref 0.61–1.24)
GFR, Estimated: 41 mL/min — ABNORMAL LOW (ref >60)
Glucose, Bld: 275 mg/dL — ABNORMAL HIGH (ref 70–99)
Potassium: 3.7 mmol/L (ref 3.5–5.1)
Sodium: 136 mmol/L (ref 135–145)
Total Bilirubin: 0.3 mg/dL (ref 0.3–1.2)
Total Protein: 6.4 g/dL — ABNORMAL LOW (ref 6.5–8.1)

## 2022-01-04 LAB — CBC
HCT: 45.5 % (ref 39.0–52.0)
Hemoglobin: 13.6 g/dL (ref 13.0–17.0)
MCH: 24.7 pg — ABNORMAL LOW (ref 26.0–34.0)
MCHC: 29.9 g/dL — ABNORMAL LOW (ref 30.0–36.0)
MCV: 82.7 fL (ref 80.0–100.0)
Platelets: 211 10*3/uL (ref 150–400)
RBC: 5.5 MIL/uL (ref 4.22–5.81)
RDW: 17.2 % — ABNORMAL HIGH (ref 11.5–15.5)
WBC: 8.6 10*3/uL (ref 4.0–10.5)
nRBC: 0 % (ref 0.0–0.2)

## 2022-01-04 LAB — GLUCOSE, CAPILLARY: Glucose-Capillary: 119 mg/dL — ABNORMAL HIGH (ref 70–99)

## 2022-01-04 LAB — RESP PANEL BY RT-PCR (FLU A&B, COVID) ARPGX2
Influenza A by PCR: NEGATIVE
Influenza B by PCR: NEGATIVE
SARS Coronavirus 2 by RT PCR: NEGATIVE

## 2022-01-04 LAB — CBG MONITORING, ED
Glucose-Capillary: 196 mg/dL — ABNORMAL HIGH (ref 70–99)
Glucose-Capillary: 171 mg/dL — ABNORMAL HIGH (ref 70–99)

## 2022-01-04 LAB — TROPONIN I (HIGH SENSITIVITY)
Troponin I (High Sensitivity): 16 ng/L (ref <18)
Troponin I (High Sensitivity): 20 ng/L — ABNORMAL HIGH (ref <18)

## 2022-01-04 MED ORDER — DM-GUAIFENESIN ER 30-600 MG PO TB12: 1.0000 | ORAL_TABLET | Freq: Two times a day (BID) | ORAL | Status: DC | PRN

## 2022-01-04 MED ORDER — PANTOPRAZOLE SODIUM 40 MG PO TBEC
40.0000 mg | DELAYED_RELEASE_TABLET | Freq: Two times a day (BID) | ORAL | Status: DC
  Administered 2022-01-04 – 2022-01-06 (×4): 40 mg via ORAL
  Filled 2022-01-04 (×4): qty 1

## 2022-01-04 MED ORDER — ROSUVASTATIN CALCIUM 10 MG PO TABS
40.0000 mg | ORAL_TABLET | Freq: Every day | ORAL | Status: DC
  Administered 2022-01-04 – 2022-01-06 (×3): 40 mg via ORAL
  Filled 2022-01-04 (×2): qty 4
  Filled 2022-01-04: qty 2
  Filled 2022-01-04: qty 4

## 2022-01-04 MED ORDER — MAGNESIUM OXIDE 400 MG PO TABS
400.0000 mg | ORAL_TABLET | Freq: Every day | ORAL | Status: DC
  Administered 2022-01-04 – 2022-01-06 (×3): 400 mg via ORAL
  Filled 2022-01-04 (×6): qty 1

## 2022-01-04 MED ORDER — ACETAMINOPHEN 325 MG PO TABS
650.0000 mg | ORAL_TABLET | Freq: Four times a day (QID) | ORAL | Status: DC | PRN
Start: 2022-01-04 — End: 2022-01-06
  Administered 2022-01-05: 650 mg via ORAL
  Filled 2022-01-04: qty 2

## 2022-01-04 MED ORDER — ADULT MULTIVITAMIN W/MINERALS CH
1.0000 | ORAL_TABLET | Freq: Every day | ORAL | Status: DC
  Administered 2022-01-04 – 2022-01-06 (×3): 1 via ORAL
  Filled 2022-01-04 (×3): qty 1

## 2022-01-04 MED ORDER — ISOSORBIDE MONONITRATE ER 30 MG PO TB24
30.0000 mg | ORAL_TABLET | Freq: Every day | ORAL | Status: DC
  Administered 2022-01-04 – 2022-01-06 (×3): 30 mg via ORAL
  Filled 2022-01-04 (×3): qty 1

## 2022-01-04 MED ORDER — ALBUTEROL SULFATE (2.5 MG/3ML) 0.083% IN NEBU
3.0000 mL | INHALATION_SOLUTION | RESPIRATORY_TRACT | Status: DC | PRN
Start: 2022-01-04 — End: 2022-01-06
  Administered 2022-01-05: 3 mL via RESPIRATORY_TRACT
  Filled 2022-01-04 (×2): qty 3

## 2022-01-04 MED ORDER — CLOPIDOGREL BISULFATE 75 MG PO TABS
75.0000 mg | ORAL_TABLET | Freq: Every day | ORAL | Status: DC
  Administered 2022-01-04 – 2022-01-06 (×3): 75 mg via ORAL
  Filled 2022-01-04 (×3): qty 1

## 2022-01-04 MED ORDER — INSULIN ASPART 100 UNIT/ML IJ SOLN
0.0000 [IU] | Freq: Three times a day (TID) | INTRAMUSCULAR | Status: DC
  Administered 2022-01-04 – 2022-01-05 (×4): 2 [IU] via SUBCUTANEOUS
  Administered 2022-01-05: 3 [IU] via SUBCUTANEOUS
  Administered 2022-01-06: 7 [IU] via SUBCUTANEOUS
  Administered 2022-01-06: 1 [IU] via SUBCUTANEOUS
  Filled 2022-01-04 (×7): qty 1

## 2022-01-04 MED ORDER — CARVEDILOL 3.125 MG PO TABS
3.1250 mg | ORAL_TABLET | Freq: Two times a day (BID) | ORAL | Status: DC
Start: 2022-01-05 — End: 2022-01-06
  Administered 2022-01-05 – 2022-01-06 (×3): 3.125 mg via ORAL
  Filled 2022-01-04 (×3): qty 1

## 2022-01-04 MED ORDER — FUROSEMIDE 10 MG/ML IJ SOLN
60.0000 mg | Freq: Once | INTRAMUSCULAR | Status: AC
  Administered 2022-01-04: 60 mg via INTRAVENOUS
  Filled 2022-01-04: qty 8

## 2022-01-04 MED ORDER — INSULIN ASPART 100 UNIT/ML IJ SOLN
0.0000 [IU] | Freq: Every day | INTRAMUSCULAR | Status: DC
  Administered 2022-01-05: 2 [IU] via SUBCUTANEOUS
  Filled 2022-01-04: qty 1

## 2022-01-04 MED ORDER — ONDANSETRON HCL 4 MG/2ML IJ SOLN: 4.0000 mg | Freq: Three times a day (TID) | INTRAMUSCULAR | Status: DC | PRN

## 2022-01-04 MED ORDER — LOSARTAN POTASSIUM 50 MG PO TABS
50.0000 mg | ORAL_TABLET | Freq: Every day | ORAL | Status: DC
  Administered 2022-01-04 – 2022-01-06 (×3): 50 mg via ORAL
  Filled 2022-01-04 (×3): qty 1

## 2022-01-04 MED ORDER — INSULIN GLARGINE-YFGN 100 UNIT/ML ~~LOC~~ SOLN
10.0000 [IU] | Freq: Every day | SUBCUTANEOUS | Status: DC
  Administered 2022-01-04 – 2022-01-05 (×2): 10 [IU] via SUBCUTANEOUS
  Filled 2022-01-04 (×4): qty 0.1

## 2022-01-04 MED ORDER — NITROGLYCERIN 0.4 MG SL SUBL
0.4000 mg | SUBLINGUAL_TABLET | SUBLINGUAL | Status: DC | PRN
Start: 2022-01-04 — End: 2022-01-06
  Administered 2022-01-04: 0.4 mg via SUBLINGUAL
  Filled 2022-01-04: qty 1

## 2022-01-04 MED ORDER — FUROSEMIDE 10 MG/ML IJ SOLN
60.0000 mg | Freq: Two times a day (BID) | INTRAMUSCULAR | Status: DC
  Administered 2022-01-04 – 2022-01-06 (×4): 60 mg via INTRAVENOUS
  Filled 2022-01-04 (×4): qty 6

## 2022-01-04 MED ORDER — HYDRALAZINE HCL 20 MG/ML IJ SOLN: 5.0000 mg | INTRAMUSCULAR | Status: DC | PRN

## 2022-01-04 MED ORDER — ACYCLOVIR 200 MG PO CAPS
800.0000 mg | ORAL_CAPSULE | Freq: Every day | ORAL | Status: DC
  Administered 2022-01-04 – 2022-01-05 (×2): 800 mg via ORAL
  Filled 2022-01-04 (×3): qty 4

## 2022-01-04 MED ORDER — HYDRALAZINE HCL 25 MG PO TABS
25.0000 mg | ORAL_TABLET | Freq: Three times a day (TID) | ORAL | Status: DC
  Administered 2022-01-04 – 2022-01-06 (×5): 25 mg via ORAL
  Filled 2022-01-04 (×5): qty 1

## 2022-01-04 MED ORDER — ENOXAPARIN SODIUM 60 MG/0.6ML IJ SOSY
0.5000 mg/kg | PREFILLED_SYRINGE | INTRAMUSCULAR | Status: DC
  Administered 2022-01-04 – 2022-01-05 (×2): 52.5 mg via SUBCUTANEOUS
  Filled 2022-01-04 (×2): qty 0.6

## 2022-01-04 NOTE — ED Notes (Signed)
Pt taken off bipap by rt.  Pt alert.  Pt eating dinner   no resp distress.  Pt watching tv.

## 2022-01-04 NOTE — ED Notes (Signed)
Resumed care from [facility].  Pt alert   bipap in place   family at bedside.  Pt waiting on admission.

## 2022-01-04 NOTE — ED Notes (Signed)
Report messaged to [employer] 

## 2022-01-04 NOTE — ED Triage Notes (Signed)
Pt to ED from home, [facility] Pt SOB and has exertional dyspnea, since a few days ago, worse this morning Hx CHF, COPD  Has demand pacemaker, placed 12/22  Complains of chest tightness, legs swollen and tight, abdomen tight.   CBG was 335, took his insulin this morning  Pt currently on 4L, for air hunger, pt was 96% on RA with EMS but felt SOB  ED provider at bedside.  Pt dosage of diuretics was recently reduced  Calling RT to bring bipap. EMS BP was 186/106

## 2022-01-04 NOTE — ED Notes (Signed)
Fsbs 171

## 2022-01-04 NOTE — ED Notes (Signed)
Took Bipap off pt so he could eat lunch. VS remained stable. Pt reports that he feels better. Family at bedside.

## 2022-01-04 NOTE — ED Provider Notes (Signed)
[facility] Provider Note    Event Date/Time   First MD Initiated Contact with Patient 01/04/22 [phone]     (approximate)   History   Shortness of Breath   HPI  [name] is a 87 y.o. male with a history of CHF, pacer and on review of past medical record had recent echo in October 2022 showing EF of 35 to 03% systolic heart failure.  Presenting to the ER for 2 days of progressively worsening shortness of breath orthopnea and exertional dyspnea.  Not hypoxic.  Does not wear home oxygen.  Having increasing air hunger.  He denies any specific chest pain or inciting events.  States has been compliant with his medications.  Feels like he is not urinating as much after Lasix.  Does feel more swollen and that his weights are going up.     Physical Exam   Triage Vital Signs: ED Triage Vitals  Enc Vitals Group     BP      Pulse      Resp      Temp      Temp src      SpO2      Weight      Height      Head Circumference      Peak Flow      Pain Score      Pain Loc      Pain Edu?      Excl. in [place]?     Most recent vital signs: Vitals:   01/04/22 0815 01/04/22 0830  BP: 126/85 129/76  Pulse: 86 74  Resp: 17 13  Temp:    SpO2: 96% 99%     Constitutional: Alert in mod respiratory distress Eyes: Conjunctivae are normal.  Head: Atraumatic. Nose: No congestion/rhinnorhea. Mouth/Throat: Mucous membranes are moist.   Neck: Painless ROM.  Cardiovascular:   Good peripheral circulation. Respiratory: tachypneic speaking in short one or two word responses, diminished bs throughout, faint anterior crackles Gastrointestinal: Soft and nontender.  Musculoskeletal:  no deformity.  2+ BLE  Neurologic:  MAE spontaneously. No gross focal neurologic deficits are appreciated.  Skin:  Skin is warm, dry and intact. No rash noted. Psychiatric: anxious appearing    ED Results / Procedures / Treatments   Labs (all labs ordered are listed, but only abnormal  results are displayed) Labs Reviewed  CBC - Abnormal; Notable for the following components:      Result Value   MCH 24.7 (*)    MCHC 29.9 (*)    RDW 17.2 (*)    All other components within normal limits  RESP PANEL BY RT-PCR (FLU A&B, COVID) [id number]  COMPREHENSIVE METABOLIC PANEL  BRAIN NATRIURETIC PEPTIDE  TROPONIN I (HIGH SENSITIVITY)  TROPONIN I (HIGH SENSITIVITY)     EKG  ED ECG REPORT I, Merlyn Lot, the attending physician, personally viewed and interpreted this ECG.   Date: 01/04/2022  EKG Time: 7:38  Rate: 90  Rhythm: sinus  Axis: normal  Intervals:  normal qt  ST&T Change: nonspecific st abn, no stemi    RADIOLOGY Please see ED Course for my review and interpretation.  I personally reviewed all radiographic images ordered to evaluate for the above acute complaints and reviewed radiology reports and findings.  These findings were personally discussed with the patient.  Please see medical record for radiology report.    PROCEDURES:  Critical Care performed: Yes, see critical care procedure note(s)  .Critical Care Performed by: Quentin Cornwall,  Saralyn Pilar, MD Authorized by: Merlyn Lot, MD   Critical care provider statement:    Critical care time (minutes):  36   Critical care was necessary to treat or prevent imminent or life-threatening deterioration of the following conditions:  Respiratory failure   Critical care was time spent personally by me on the following activities:  Ordering and performing treatments and interventions, ordering and review of laboratory studies, ordering and review of radiographic studies, pulse oximetry, re-evaluation of patient's condition, review of old charts, obtaining history from patient or surrogate, examination of patient, evaluation of patient's response to treatment, discussions with primary provider, discussions with consultants and development of treatment plan with patient or surrogate .1-3 Lead EKG  Interpretation Performed by: Merlyn Lot, MD Authorized by: Merlyn Lot, MD     Interpretation: normal     ECG rate:  90   ECG rate assessment: normal     Rhythm: sinus rhythm     MEDICATIONS ORDERED IN ED: Medications  nitroGLYCERIN (NITROSTAT) SL tablet 0.4 mg (0.4 mg Sublingual Given 01/04/22 0758)  furosemide (LASIX) injection 60 mg (60 mg Intravenous Given 01/04/22 0943)     IMPRESSION / MDM / ASSESSMENT AND PLAN / ED COURSE  I reviewed the triage vital signs and the nursing notes.                              Differential diagnosis includes, but is not limited to, Asthma, copd, CHF, pna, ptx, malignancy, Pe, anemia  Presenting in moderate respiratory distress though not hypoxic presenting with significant tachypnea air hunger exam findings concerning for acute CHF pulmonary edema.  Patient placed on BiPAP.  Will give nitro his diastolic pressures are elevated.  The patient will be placed on continuous pulse oximetry and telemetry for monitoring.  Laboratory evaluation will be sent to evaluate for the above complaints.     Clinical Course as of 01/04/22 1005  Mon Jan 04, 2022  0746 Chest x-ray by my review shows findings consistent with pulmonary edema diffusely.  Have placed on BiPAP.  Will give IV Lasix. [PR]  [phone] Patient appears much more comfortable on BiPAP.  Holding off on IV Lasix to see what his renal function as to determine whether bolus Lasix versus Lasix infusion is indicated. [PR]  [phone] Chemistry lab is down uncertain as to when results will be posted will give dose of IV Lasix. [PR]  1004 Patient feeling significantly improved.  I have consulted hospitalist for admission and they agreed to admit patient to their service. [PR]    Clinical Course User Index [PR] Merlyn Lot, MD     FINAL CLINICAL IMPRESSION(S) / ED DIAGNOSES   Final diagnoses:  Acute on chronic systolic congestive heart failure ([place])     Rx / DC Orders   ED Discharge  Orders     None        Note:  This document was prepared using Dragon voice recognition software and may include unintentional dictation errors.    Merlyn Lot, MD 01/04/22 1005

## 2022-01-04 NOTE — ED Notes (Signed)
Informed RN bed assigned 

## 2022-01-04 NOTE — ED Notes (Signed)
Pt resting on bipap, family at bedside. VSS. No needs or concerns voiced

## 2022-01-04 NOTE — Progress Notes (Signed)
PHARMACIST - PHYSICIAN COMMUNICATION  CONCERNING:  Enoxaparin (Lovenox) for DVT Prophylaxis   DESCRIPTION: Patient was prescribed enoxaprin 40mg  q24 hours for VTE prophylaxis.   Filed Weights   01/04/22 0735  Weight: 105.2 kg (232 lb)    Body mass index is 35.28 kg/m.  Estimated Creatinine Clearance: 39.4 mL/min (A) (by C-G formula based on SCr of 1.64 mg/dL (H)).   Based on [place] patient is candidate for enoxaparin 0.5mg /kg TBW SQ every 24 hours based on BMI being >30.  RECOMMENDATION: Pharmacy has adjusted enoxaparin dose per [facility] policy.  Patient is now receiving enoxaparin 52.5 mg every 24 hours    Darnelle Bos, PharmD Clinical Pharmacist  01/04/2022 10:20 AM

## 2022-01-04 NOTE — ED Notes (Signed)
Called Respiratory will be down to place pt on bipap after grace period is over after his lunch.

## 2022-01-04 NOTE — H&P (Signed)
History and Physical    [name] VHQ:[id number] DOB: 1935-05-31 DOA: 01/04/2022  Referring MD/NP/PA:   PCP: Tomasita Morrow, MD   Patient coming from:  The patient is coming from home.  At baseline, pt is independent for most of ADL.        Chief Complaint: SOB  HPI: [name] is a 87 y.o. male with medical history significant of sCHF with EF 35-40, HTN, HLD, DM, GERD, CKD stage IIIa, CAD, CABG, cardiac arrest, who presents with shortness of breath.  Patient states that he has been having progressively shortness of breath for more than 3 days, particularly on exertion.  He has chest pressure, no active chest pain.  He has cough with clear mucus production.  No fever or chills.  Patient does not have nausea, vomiting, diarrhea or abdominal pain.  No symptoms of UTI.  He has worsening bilateral lower leg edema.  Data Reviewed and ED Course: pt was found to have BNP 2093, WBC 8.6, negative COVID PCR, stable renal function,, temperature normal, blood pressure 106/87, 129/76, heart rate 90, RR 28, oxygen saturation 99% on BiPAP, chest x-ray showed interstitial pulmonary edema.  Patient is admitted to PCU as inpatient.   EKG: I have personally reviewed.  Paced rhythm, early R wave progression, QT C497   Review of Systems:   General: no fevers, chills, no body weight gain, has fatigue HEENT: no blurry vision, hearing changes or sore throat Respiratory: has dyspnea, coughing, no wheezing CV: has chest pressure, no palpitations GI: no nausea, vomiting, abdominal pain, diarrhea, constipation GU: no dysuria, burning on urination, increased urinary frequency, hematuria  Ext: has leg edema Neuro: no unilateral weakness, numbness, or tingling, no vision change or hearing loss Skin: no rash, no skin tear. MSK: No muscle spasm, no deformity, no limitation of range of movement in spin Heme: No easy bruising.  Travel history: No recent long distant travel.   Allergy:  Allergies   Allergen Reactions   Angiotensin Receptor Blockers     hyperkalemia   Metformin Diarrhea   Spironolactone     Hyperkalemia     Past Medical History:  Diagnosis Date   AV block, [place] 1    CAD (coronary artery disease)    a. 08/2019 VF arrest-->sev 3vd on cath-->CABGx5 (LIMA->LAD, VG->OM->LCX, VG->RPDA, VG->Diag); b. 08/2021 Cath: sev native multivessel dzs w/ 5/5 patent grafts. Nl filling pressures->Med rx.   CKD (chronic kidney disease), stage III (HCC)    Diabetes mellitus without complication ([place])    HFrEF (heart failure with reduced ejection fraction) ([place])    a. 08/2020 Echo: EF 40-45%; b. 11/2020 Echo: EF 55%, no rwma, mild LVH, Gr2 DD. nl RV fxn; c. 08/2021 Echo: EF 35-40%, glob HK w/ ant/antsept/apical HK. Reduced RV fxn. Mildly dil LA; d. 10/2021 s/p CRT-P.   Hyperlipidemia LDL goal <70    Hypertension    Intermittent Complete heart block ([place])    a. 08/2021 noted on Zio; b. 10/2021 s/p Abbott Allure RF CRT-P (Ser # [id number]).   Ischemic cardiomyopathy    a. 08/2020 Echo: EF 40-45%; b. 11/2020 Echo: EF 55%; c. 08/2021 Echo: EF 35-40%; d. 10/2021 s/p CRT-P.   Trifascicular block     Past Surgical History:  Procedure Laterality Date   BACK SURGERY     CARDIAC CATHETERIZATION     CLIPPING OF ATRIAL APPENDAGE N/A 08/29/2020   Procedure: CLIPPING OF ATRIAL APPENDAGE USING ATRICURE 66 MM ATRICLIP FLEX-V;  Surgeon: Grace Isaac, MD;  Location:  [place] OR;  Service: Open Heart Surgery;  Laterality: N/A;   CORONARY ARTERY BYPASS GRAFT N/A 08/29/2020   Procedure: CORONARY ARTERY BYPASS GRAFTING (CABG) X 5 USING LEFT INTERNAL MAMMARY ARTERY AND ENDOSCOPICALLY HARVESTED RIGHT GREATER SAPHENOUS VEIN. LIMA TO LAD, SVG TO OM SEQ TO CIRC, SVG TO PD, SVG TO DIAG.;  Surgeon: Grace Isaac, MD;  Location: [facility];  Service: Open Heart Surgery;  Laterality: N/A;   ENDOVEIN HARVEST OF GREATER SAPHENOUS VEIN Right 08/29/2020   Procedure: ENDOVEIN HARVEST OF GREATER SAPHENOUS VEIN;   Surgeon: Grace Isaac, MD;  Location: [place];  Service: Open Heart Surgery;  Laterality: Right;   HERNIA REPAIR     LEFT HEART CATH AND CORONARY ANGIOGRAPHY N/A 08/25/2020   Procedure: LEFT HEART CATH AND CORONARY ANGIOGRAPHY;  Surgeon: [name], MD;  Location: [facility] [facility];  Service: Cardiovascular;  Laterality: N/A;   PACEMAKER IMPLANT N/A 10/12/2021   Procedure: PACEMAKER IMPLANT;  Surgeon: Vickie Epley, MD;  Location: [facility];  Service: Cardiovascular;  Laterality: N/A;   RIGHT/LEFT HEART CATH AND CORONARY ANGIOGRAPHY N/A 08/31/2021   Procedure: RIGHT/LEFT HEART CATH AND CORONARY ANGIOGRAPHY;  Surgeon: [name], MD;  Location: [facility];  Service: Cardiovascular;  Laterality: N/A;   TEE WITHOUT CARDIOVERSION N/A 08/29/2020   Procedure: TRANSESOPHAGEAL ECHOCARDIOGRAM (TEE);  Surgeon: Grace Isaac, MD;  Location: [place];  Service: Open Heart Surgery;  Laterality: N/A;    Social History:  reports that he quit smoking about 25 years ago. His smoking use included cigarettes. He has never used smokeless tobacco. He reports that he does not currently use alcohol. He reports that he does not use drugs.  Family History:  Family History  Problem Relation Age of Onset   Heart attack Mother      Prior to Admission medications   Medication Sig Start Date End Date Taking? Authorizing Provider  acetaminophen (TYLENOL) 500 MG tablet Take 500-1,000 mg by mouth every 6 (six) hours as needed for mild pain or moderate pain.     [provider]  acyclovir (ZOVIRAX) 800 MG tablet Take 800 mg by mouth daily. 12/02/20   [provider]  albuterol (VENTOLIN HFA) 108 (90 Base) MCG/ACT inhaler Inhale 2 puffs into the lungs every 6 (six) hours as needed for wheezing or shortness of breath. 09/30/21   [provider]  carvedilol (COREG) 3.125 MG tablet Take 1 tablet (3.125 mg total) by mouth 2 (two) times daily with a meal.  11/12/21 02/10/22  [name], MD  clopidogrel (PLAVIX) 75 MG tablet TAKE 1 TABLET BY MOUTH EVERY DAY 05/17/21   Loel Dubonnet, NP  fluticasone (FLOVENT HFA) 44 MCG/ACT inhaler Inhale 2 puffs into the lungs in the morning and at bedtime. Patient not taking: Reported on 12/08/2021    [provider]  furosemide (LASIX) 80 MG tablet Take 1 tablet (80 mg total) by mouth daily. 10/30/21 01/28/22  Enzo Bi, MD  hydrALAZINE (APRESOLINE) 25 MG tablet Take 25 mg by mouth 2 (two) times daily.    [provider]  insulin detemir (LEVEMIR) 100 UNIT/ML injection Inject 0.15 mLs (15 Units total) into the skin at bedtime. 10/30/21 01/28/22  Enzo Bi, MD  isosorbide mononitrate (IMDUR) 30 MG 24 hr tablet Take 1 tablet (30 mg total) by mouth daily. 11/25/21 02/23/22  Darylene Price A, FNP  magnesium oxide (MAG-OX) 400 MG tablet TAKE 1 TABLET BY MOUTH EVERY DAY 06/01/21  [name], MD  metolazone (ZAROXOLYN) 2.5 MG tablet Take 1 tablet (2.5) mg 1 hour before Lasix weekly on Tuesdays 11/25/21   Alisa Graff, FNP  Multiple Vitamin (MULTIVITAMIN WITH MINERALS) TABS tablet Take 1 tablet by mouth daily. 09/11/20   Gold, Wayne E, PA-C  pantoprazole (PROTONIX) 40 MG tablet Take 40 mg by mouth 2 (two) times daily.    [provider]  rosuvastatin (CRESTOR) 40 MG tablet TAKE 1 TABLET BY MOUTH EVERY DAY 12/14/21   [name], MD    Physical Exam: Vitals:   01/04/22 1515 01/04/22 1600 01/04/22 1700 01/04/22 1800  BP: (!) 159/94 (!) 152/94 (!) 152/83 (!) 143/91  Pulse: 67 64 60 64  Resp: 16 17 (!) 23 (!) 25  Temp:      TempSrc:      SpO2: 98% 99% 98% 98%  Weight:      Height:       General: Not in acute distress HEENT:       Eyes: PERRL, EOMI, no scleral icterus.       ENT: No discharge from the ears and nose, no pharynx injection, no tonsillar enlargement.        Neck: positive JVD, no bruit, no mass felt. Heme: No neck lymph node enlargement. Cardiac: S1/S2, RRR, No  murmurs, No gallops or rubs. Respiratory: Has fine crackles bilaterally GI: Soft, nondistended, nontender, no rebound pain, no organomegaly, BS present. GU: No hematuria Ext: 2+ pitting leg edema bilaterally. 1+DP/PT pulse bilaterally. Musculoskeletal: No joint deformities, No joint redness or warmth, no limitation of ROM in spin. Skin: No rashes.  Neuro: Alert, oriented X3, cranial nerves II-XII grossly intact, moves all extremities normally. Psych: Patient is not psychotic, no suicidal or hemocidal ideation.  Labs on Admission: I have personally reviewed following labs and imaging studies  CBC: Recent Labs  Lab 01/04/22 0739  WBC 8.6  HGB 13.6  HCT 45.5  MCV 82.7  PLT 425   Basic Metabolic Panel: Recent Labs  Lab 01/04/22 0739  NA 136  K 3.7  CL 103  CO2 23  GLUCOSE 275*  BUN 32*  CREATININE 1.64*  CALCIUM 8.4*   GFR: Estimated Creatinine Clearance: 39.4 mL/min (A) (by C-G formula based on SCr of 1.64 mg/dL (H)). Liver Function Tests: Recent Labs  Lab 01/04/22 0739  AST 39  ALT 42  ALKPHOS 81  BILITOT 0.3  PROT 6.4*  ALBUMIN 3.3*   No results for input(s): LIPASE, AMYLASE in the last 168 hours. No results for input(s): AMMONIA in the last 168 hours. Coagulation Profile: No results for input(s): INR, PROTIME in the last 168 hours. Cardiac Enzymes: No results for input(s): CKTOTAL, CKMB, CKMBINDEX, TROPONINI in the last 168 hours. BNP (last 3 results) No results for input(s): PROBNP in the last 8760 hours. HbA1C: No results for input(s): HGBA1C in the last 72 hours. CBG: Recent Labs  Lab 01/04/22 1208 01/04/22 1638  GLUCAP 196* 171*   Lipid Profile: No results for input(s): CHOL, HDL, LDLCALC, TRIG, CHOLHDL, LDLDIRECT in the last 72 hours. Thyroid Function Tests: No results for input(s): TSH, T4TOTAL, FREET4, T3FREE, THYROIDAB in the last 72 hours. Anemia Panel: No results for input(s): VITAMINB12, FOLATE, FERRITIN, TIBC, IRON, RETICCTPCT in the  last 72 hours. Urine analysis:    Component Value Date/Time   COLORURINE YELLOW 09/01/2020 2035   APPEARANCEUR HAZY (A) 09/01/2020 2035   LABSPEC 1.010 09/01/2020 2035   PHURINE 5.0 09/01/2020 2035   GLUCOSEU NEGATIVE 09/01/2020  2035   HGBUR MODERATE (A) 09/01/2020 2035   BILIRUBINUR NEGATIVE 09/01/2020 2035   KETONESUR NEGATIVE 09/01/2020 2035   PROTEINUR NEGATIVE 09/01/2020 2035   NITRITE NEGATIVE 09/01/2020 2035   LEUKOCYTESUR TRACE (A) 09/01/2020 2035   Sepsis Labs: @LABRCNTIP (procalcitonin:4,lacticidven:4) ) Recent Results (from the past 240 hour(s))  Resp Panel by RT-PCR (Flu A&B, Covid) Nasopharyngeal Swab     Status: None   Collection Time: 01/04/22  7:39 AM   Specimen: Nasopharyngeal Swab; Nasopharyngeal(NP) swabs in vial transport medium  Result Value Ref Range Status   SARS Coronavirus 2 by RT PCR NEGATIVE NEGATIVE Final    Comment: (NOTE) SARS-CoV-2 target nucleic acids are NOT DETECTED.  The SARS-CoV-2 RNA is generally detectable in upper respiratory specimens during the acute phase of infection. The lowest concentration of SARS-CoV-2 viral copies this assay can detect is 138 copies/mL. A negative result does not preclude SARS-Cov-2 infection and should not be used as the sole basis for treatment or other patient management decisions. A negative result may occur with  improper specimen collection/handling, submission of specimen other than nasopharyngeal swab, presence of viral mutation(s) within the areas targeted by this assay, and inadequate number of viral copies(<138 copies/mL). A negative result must be combined with clinical observations, patient history, and epidemiological information. The expected result is Negative.  Fact Sheet for Patients:  EntrepreneurPulse.com.au  Fact Sheet for Healthcare Providers:  IncredibleEmployment.be  This test is no t yet approved or cleared by the [place] FDA and  has been  authorized for detection and/or diagnosis of SARS-CoV-2 by FDA under an Emergency Use Authorization (EUA). This EUA will remain  in effect (meaning this test can be used) for the duration of the COVID-19 declaration under Section 564(b)(1) of the Act, 21 U.S.C.section 360bbb-3(b)(1), unless the authorization is terminated  or revoked sooner.       Influenza A by PCR NEGATIVE NEGATIVE Final   Influenza B by PCR NEGATIVE NEGATIVE Final    Comment: (NOTE) The Xpert Xpress SARS-CoV-2/FLU/RSV plus assay is intended as an aid in the diagnosis of influenza from Nasopharyngeal swab specimens and should not be used as a sole basis for treatment. Nasal washings and aspirates are unacceptable for Xpert Xpress SARS-CoV-2/FLU/RSV testing.  Fact Sheet for Patients: EntrepreneurPulse.com.au  Fact Sheet for Healthcare Providers: IncredibleEmployment.be  This test is not yet approved or cleared by the [place] FDA and has been authorized for detection and/or diagnosis of SARS-CoV-2 by FDA under an Emergency Use Authorization (EUA). This EUA will remain in effect (meaning this test can be used) for the duration of the COVID-19 declaration under Section 564(b)(1) of the Act, 21 U.S.C. section 360bbb-3(b)(1), unless the authorization is terminated or revoked.  Performed at [facility], [address] [place], [place] [address]      Radiological Exams on Admission: DG Chest Portable 1 View  Result Date: 01/04/2022 CLINICAL DATA:  Respiratory distress EXAM: PORTABLE CHEST 1 VIEW COMPARISON:  10/27/2021 FINDINGS: Congested appearance of vessels with cephalized blood flow and interstitial thickening. Cardiomegaly with prior CABG, left atrial clipping, and biventricular pacer implant. Partially calcified mass associated with the deep right scapula on a 2021 CT. IMPRESSION: CHF. Electronically Signed   By: Jorje Guild M.D.   On: 01/04/2022 07:47       Assessment/Plan Principal Problem:   Acute on chronic systolic CHF (congestive heart failure) (HCC) Active Problems:   HTN (hypertension)   CAD, multiple vessel   Stage 3a chronic kidney disease ([place])  HLD (hyperlipidemia)   Type II diabetes mellitus with renal manifestations (HCC)   Acute on chronic systolic CHF (congestive heart failure) ([place]): 2D echo on 08/27/2021 showed EF of 35-40%.  BNP 2093, chest x-ray showed interstitial pulmonary edema.  -Will admit to progressive unit as inpatient -Lasix 60 mg bid by IV -2d echo -Daily weights -strict I/O's -Low salt diet -Fluid restriction -Obtain REDs Vest reading  HTN (hypertension) -IV hydralazine as needed Coreg, hydralazine, Cozaar,  CAD, multiple vessel: S/p of CABG.  -Continue Crestor, Plavix, Imdur  Stage 3a chronic kidney disease ([place]) -Follow-up with BMP  HLD (hyperlipidemia) -Crestor  Type II diabetes mellitus with renal manifestations ([place]): Recent A1c 6.8, well controlled.  Patient taking Levemir 15 unit daily -Sliding scale insulin -Glargine insulin 10 units daily         DVT ppx:   SQ Lovenox  Code Status: Full code per pt and his daughter  Family Communication:   Yes, patient's  daughter  at bed side.    Disposition Plan:  Anticipate discharge back to previous environment  Consults called:  none  Admission status and Level of care: Progressive:    as inpt        Severity of Illness:  The appropriate patient status for this patient is INPATIENT. Inpatient status is judged to be reasonable and necessary in order to provide the required intensity of service to ensure the patient's safety. The patient's presenting symptoms, physical exam findings, and initial radiographic and laboratory data in the context of their chronic comorbidities is felt to place them at high risk for further clinical deterioration. Furthermore, it is not anticipated that the patient will be medically stable for  discharge from the hospital within 2 midnights of admission.   * I certify that at the point of admission it is my clinical judgment that the patient will require inpatient hospital care spanning beyond 2 midnights from the point of admission due to high intensity of service, high risk for further deterioration and high frequency of surveillance required.*       Date of Service 01/04/2022    Ivor Costa [facility]   If 7PM-7AM, please contact night-coverage [email or web address] 01/04/2022, 6:35 PM

## 2022-01-05 ENCOUNTER — Inpatient Hospital Stay (HOSPITAL_COMMUNITY)
Admit: 2022-01-05 | Discharge: 2022-01-05 | Disposition: A | Discharge status: Home / Self Care | Payer: Medicare HMO | Attending: Internal Medicine | Admitting: Internal Medicine

## 2022-01-05 DIAGNOSIS — I5021 Acute systolic (congestive) heart failure: Secondary | ICD-10-CM | POA: Diagnosis not present

## 2022-01-05 LAB — BASIC METABOLIC PANEL
Anion gap: 5 (ref 5–15)
BUN: 30 mg/dL — ABNORMAL HIGH (ref 8–23)
CO2: 28 mmol/L (ref 22–32)
Calcium: 8.2 mg/dL — ABNORMAL LOW (ref 8.9–10.3)
Chloride: 108 mmol/L (ref 98–111)
Creatinine, Ser: 1.47 mg/dL — ABNORMAL HIGH (ref 0.61–1.24)
GFR, Estimated: 47 mL/min — ABNORMAL LOW (ref >60)
Glucose, Bld: 167 mg/dL — ABNORMAL HIGH (ref 70–99)
Potassium: 3.7 mmol/L (ref 3.5–5.1)
Sodium: 141 mmol/L (ref 135–145)

## 2022-01-05 LAB — ECHOCARDIOGRAM COMPLETE
AR max vel: 1.83 cm2
AV Area VTI: 2.05 cm2
AV Area mean vel: 1.85 cm2
AV Mean grad: 2.7 mmHg
AV Peak grad: 4.9 mmHg
Ao pk vel: 1.11 m/s
Area-P 1/2: 4.01 cm2
Calc EF: 30.1 %
Height: 68 in
S' Lateral: 4.9 cm
Single Plane A2C EF: 20 %
Single Plane A4C EF: 30.8 %
Weight: 3742.53 oz

## 2022-01-05 LAB — GLUCOSE, CAPILLARY
Glucose-Capillary: 159 mg/dL — ABNORMAL HIGH (ref 70–99)
Glucose-Capillary: 244 mg/dL — ABNORMAL HIGH (ref 70–99)
Glucose-Capillary: 156 mg/dL — ABNORMAL HIGH (ref 70–99)
Glucose-Capillary: 221 mg/dL — ABNORMAL HIGH (ref 70–99)

## 2022-01-05 LAB — MAGNESIUM: Magnesium: 2 mg/dL (ref 1.7–2.4)

## 2022-01-05 NOTE — Progress Notes (Signed)
Progress Note   Patient: [name] ZSW:[id number] DOB: 08/31/1935 DOA: 01/04/2022     1 DOS: the patient was seen and examined on 01/05/2022   Brief hospital course: Taken from H&P.  [name] is a 87 y.o. male with medical history significant of sCHF with EF 35-40, HTN, HLD, DM, GERD, CKD stage IIIa, CAD, CABG, cardiac arrest, who presents with shortness of breath.   Patient states that he has been having progressively shortness of breath for more than 3 days, particularly on exertion.  He has chest pressure, no active chest pain.  He has cough with clear mucus production.  No fever or chills.  No urinary symptoms.  On arrival to ED he was hemodynamically stable, labs only pertinent for elevated BNP at 2093.  Chest x-ray shows interstitial pulmonary edema He was started on IV Lasix, repeat echo ordered, which showed EF of 30 to 35%, global hypokinesis and grade 3 diastolic dysfunction.  More consistent with dilated cardiomyopathy.   Assessment and Plan: * Acute on chronic systolic CHF (congestive heart failure) ([place])- (present on admission) Clinical picture consistent with acute on chronic HFrEF. Repeat echo with  Good urinary output, no intake recorded.  Renal functions improving -Continue with IV Lasix 60 mg twice daily -Continue with daily weight and BMP -Strict intake and output -Fluid restriction  CAD, multiple vessel- (present on admission) S/p CABG Barely positive troponin with a flat curve, most likely secondary to demand ischemia with acute on chronic HFrEF.  No chest pain. -Continue with Crestor, Plavix and Imdur  HTN (hypertension)- (present on admission) Blood pressure within goal. -Continue with Coreg, hydralazine and Cozaar.  Stage 3a chronic kidney disease ([place])- (present on admission) Renal function around baseline, some improvement with diuresis. -Monitor renal function -Avoid nephrotoxins  HLD (hyperlipidemia)- (present on admission) - Continue with  statin  Type II diabetes mellitus with renal manifestations ([place])- (present on admission) Seems well controlled.  Recent A1c of 6.8.  Patient was taking Levemir 15 units daily at home. -Continue with SSI -Continue with 10 units of Semglee daily  Subjective: Patient continue to feel short of breath, having some cough.  Saturating well on room air.  Physical Exam: Vitals:   01/04/22 2100 01/04/22 2348 01/05/22 0439 01/05/22 0910  BP: (!) 150/90 128/75 130/78 126/65  Pulse:  65 65 67  Resp:  17 (!) 22   Temp:  98.3 F (36.8 C) 98.2 F (36.8 C) 98.7 F (37.1 C)  TempSrc:    Oral  SpO2:  97% 100% 97%  Weight:   106.1 kg   Height:       General.  In no acute distress. Pulmonary.  Few basal crackles bilaterally, normal respiratory effort. CV.  Regular rate and rhythm, no JVD, rub or murmur. Abdomen.  Soft, nontender, nondistended, BS positive. CNS.  Alert and oriented .  No focal neurologic deficit. Extremities.  2+ LE edema, no cyanosis, pulses intact and symmetrical. Psychiatry.  Judgment and insight appears normal.  Data Reviewed: Prior notes, labs and images reviewed  Family Communication: Called wife with no response, no message left due to generic voicemail.  Disposition: Status is: Inpatient Remains inpatient appropriate because: Severity of illness  Planned Discharge Destination: Home with Home Health  DVT prophylaxis.  Lovenox  Time spent: 45 minutes  This record has been created using Systems analyst. Errors have been sought and corrected,but may not always be located. Such creation errors do not reflect on the standard of care.  Author:  Lorella Nimrod, MD 01/05/2022 1:29 PM  For on call review [email or web address].

## 2022-01-05 NOTE — Consult Note (Addendum)
° °  Heart Failure Nurse Navigator Note  HFrEF 35 to 40%.  By echocardiogram performed in October 2022.  Echocardiogram results are pending on this admission.  He presented with a 3-day history of worsening shortness of breath and lower extremity edema.  Comorbidities:  Coronary artery disease with coronary artery bypass grafting Hypertension Hyperlipidemia Diabetes GERD Chronic kidney disease stage III  Medications:  Carvedilol 3.125 mg 2 times a day with meals Plavix 75 mg daily Furosemide 60 mg IV every 12 hours Hydralazine 25 mg 3 times a day  Isosorbide mononitrate 30 mg daily Losartan 50 mg daily Rosuvastatin 40 mg daily  Labs:  BNP 2093, sodium 141, potassium 3.7, chloride 108, CO2 28, BUN 30, creatinine 1.47, GFR 47. Intake not documented Output 2350 Weight 106.1 kg Blood pressure 126/65   Initial meeting with patient on this admission.  Had last been discharged on October 30, 2021, after being admitted for heart failure  States at home that he had been compliant with all his medications and not missed any of those.  He also had been weighing himself daily weights varied from 229-233.  He does not use salt.  He did not feel that he went over 64 ounces, he states that he drinks 2-16 ounce bottles of water daily along with a cup of coffee in the morning.  Discussed the symptoms that he presented with.  Explained that if he would have called Darylene Price FNP explaining to her his symptoms that she could have brought him into the clinic for a visit and possibly given him IV Lasix.  And avoided the emergency room and  admission.  By teach back method went over his symptoms such as increased swelling, increasing shortness of breath, unable to lie flat at night to sleep, weight gain and dry hacky cough to report.  She has an appointment in the outpatient heart failure clinic on March 9 at 11:30 AM.  He has a no-show ratio of 12% which is 11 out of 92  appointments.  Pricilla Riffle RN [facility]

## 2022-01-05 NOTE — Assessment & Plan Note (Signed)
-   Continue with statin. 

## 2022-01-05 NOTE — Assessment & Plan Note (Signed)
S/p CABG Barely positive troponin with a flat curve, most likely secondary to demand ischemia with acute on chronic HFrEF.  No chest pain. -Continue with Crestor, Plavix and Imdur

## 2022-01-05 NOTE — Progress Notes (Signed)
PT Cancellation Note  Patient Details Name: [name] MRN: [id number] DOB: 17-May-1935   Cancelled Treatment:    Reason Eval/Treat Not Completed: PT screened, no needs identified, will sign off.  Per discussion with OT, no PT needs currently at this time as pt is able to ambulate at will without difficulty.  No skilled PT needs identified, will sign off at this time.  Please re-consult as needs arise.     Gwenlyn Saran, PT, DPT 01/05/22, 12:06 PM

## 2022-01-05 NOTE — Evaluation (Signed)
Occupational Therapy Evaluation Patient Details Name: [name] MRN: [id number] DOB: 02/15/1935 Today's Date: 01/05/2022   History of Present Illness [name] is a 87 y.o. male with medical history significant of sCHF with EF 35-40, HTN, HLD, DM, GERD, CKD stage IIIa, CAD, CABG, cardiac arrest, who presents with shortness of breath.   Clinical Impression   [name] was seen for OT evaluation this date. Prior to hospital admission, pt was MOD I for ADLs and mobility using SPC. Pt lives with spouse in home c 5 STE. Pt requires SUPERVISION + RW for ADL t/f, initial cues for RW use improves to MOD I with second trial. Tolerates ~250 ft and x2 stairs - cues for rest breaks as pt has increased WOB, SpO2 mid 90s t/o. Independent don B shoes sitting. Pt instructed on ECS, all education complete. No skilled OT needs identified. Will sign off. Please re-consult if additional OT needs arise.      Recommendations for follow up therapy are one component of a multi-disciplinary discharge planning process, led by the attending physician.  Recommendations may be updated based on patient status, additional functional criteria and insurance authorization.   Follow Up Recommendations  Other (comment) (cardiopulmonary rehab)    Assistance Recommended at Discharge Set up Supervision/Assistance  Patient can return home with the following Assistance with cooking/housework;Help with stairs or ramp for entrance    Functional Status Assessment  Patient has had a recent decline in their functional status and demonstrates the ability to make significant improvements in function in a reasonable and predictable amount of time.  Equipment Recommendations  BSC/3in1    Recommendations for Other Services       Precautions / Restrictions Precautions Precautions: None Restrictions Weight Bearing Restrictions: No      Mobility Bed Mobility Overal bed mobility: Independent                   Transfers Overall transfer level: Needs assistance Equipment used: Rolling walker (2 wheels) Transfers: Sit to/from Stand Sit to Stand: Supervision           General transfer comment: cues for RW use, improves to MOD I with second trial      Balance Overall balance assessment: Needs assistance Sitting-balance support: No upper extremity supported, Feet supported Sitting balance-[name]: Normal     Standing balance support: Single extremity supported, During functional activity Standing balance-[name]: Good                             ADL either performed or assessed with clinical judgement   ADL Overall ADL's : Needs assistance/impaired                                       General ADL Comments: SUPERVISION + RW for ADL t/f, tolerates ~250 ft and x2 stairs - cues for rest breaks as pt has increased WOB, SpO2 mid 90s t/o. Independent don B shoes sitting.      Pertinent Vitals/Pain Pain Assessment Pain Assessment: No/denies pain     Hand Dominance Right   Extremity/Trunk Assessment Upper Extremity Assessment Upper Extremity Assessment: Overall WFL for tasks assessed   Lower Extremity Assessment Lower Extremity Assessment: Overall WFL for tasks assessed       Communication Communication Communication: HOH   Cognition Arousal/Alertness: Awake/alert Behavior During Therapy: WFL for tasks assessed/performed Overall  Cognitive Status: Within Functional Limits for tasks assessed                                        Home Living Family/patient expects to be discharged to:: Private residence Living Arrangements: Spouse/significant other Available Help at Discharge: Family;Available 24 hours/day Type of Home: House Home Access: Stairs to enter [name] of Steps: 5 Entrance Stairs-Rails: None Home Layout: One level     Bathroom Shower/Tub: Occupational psychologist: Standard      Home Equipment: Cane - single Barista (2 wheels)          Prior Functioning/Environment Prior Level of Function : Independent/Modified Independent             Mobility Comments: Uses SPC ADLs Comments: Independent with ADLs. Wife assists with IADLs        OT Problem List: Decreased strength;Decreased safety awareness         OT Goals(Current goals can be found in the care plan section) Acute Rehab OT Goals Patient Stated Goal: to breathe better OT Goal Formulation: With patient Time For Goal Achievement: 01/19/22 Potential to Achieve Goals: Good   AM-PAC OT "6 Clicks" Daily Activity     Outcome Measure Help from another person eating meals?: None Help from another person taking care of personal grooming?: None Help from another person toileting, which includes using toliet, bedpan, or urinal?: None Help from another person bathing (including washing, rinsing, drying)?: A Little Help from another person to put on and taking off regular upper body clothing?: None Help from another person to put on and taking off regular lower body clothing?: None 6 Click Score: 23   End of Session Equipment Utilized During Treatment: Rolling walker (2 wheels)  Activity Tolerance: Patient tolerated treatment well Patient left: in chair;with call bell/phone within reach  OT Visit Diagnosis: Unsteadiness on feet (R26.81)                Time: 0981-1914 OT Time Calculation (min): 18 min Charges:  OT General Charges $OT Visit: 1 Visit OT Evaluation $OT Eval Low Complexity: 1 Low OT Treatments $Self Care/Home Management : 8-22 mins  Dessie Coma, M.S. OTR/L  01/05/22, 1:03 PM  ascom [phone]

## 2022-01-05 NOTE — Progress Notes (Signed)
*  PRELIMINARY RESULTS* Echocardiogram 2D Echocardiogram has been performed.  Sherrie Sport 01/05/2022, 7:50 AM

## 2022-01-05 NOTE — TOC Initial Note (Signed)
Transition of Care [facility]) - Initial/Assessment Note    Patient Details  Name: [name] MRN: [id number] Date of Birth: [birth year]  [facility]) CM/SW Contact:    Alberteen Sam, LCSW Phone Number: 01/05/2022, 2:15 PM  Clinical Narrative:                  CSW notes that patient is from home with wife. Per last recent admission patient stated wife is able to assist him at home. Patient states he is independent.  He still drives but wife takes him to his doctors apt.  No issues with getting his medications.  Wife will transport home at dc. PCP is Dr. Dorena Dew.   Please notify TOC should needs arise.   Expected Discharge Plan: Home/Self Care Barriers to Discharge: Continued Medical Work up   Patient Goals and CMS Choice Patient states their goals for this hospitalization and ongoing recovery are:: to go home CMS Medicare.gov Compare Post Acute Care list provided to:: Patient Choice offered to / list presented to : Patient  Expected Discharge Plan and Services Expected Discharge Plan: Home/Self Care                                              Prior Living Arrangements/Services   Lives with:: Spouse                   Activities of Daily Living      Permission Sought/Granted                  Emotional Assessment       Orientation: : Oriented to Self, Oriented to Place, Oriented to  Time, Oriented to Situation Alcohol / Substance Use: Not Applicable Psych Involvement: No (comment)  Admission diagnosis:  Acute on chronic systolic congestive heart failure (HCC) [I50.23] Acute on chronic systolic CHF (congestive heart failure) (HCC) [I50.23] Patient Active Problem List   Diagnosis Date Noted   Acute on chronic systolic CHF (congestive heart failure) ([place]) 01/04/2022   Acute on chronic combined systolic and diastolic CHF (congestive heart failure) ([place]) 10/30/2021   Dilated cardiomyopathy ([place]) 10/30/2021   Acute decompensated  heart failure ([place]) 10/27/2021   Complete heart block ([place]) 10/12/2021   Junctional bradycardia    Acute on chronic systolic congestive heart failure (HCC)    Acute on chronic diastolic CHF (congestive heart failure) ([place]) 08/26/2021   CAD (coronary artery disease)    HLD (hyperlipidemia)    GERD (gastroesophageal reflux disease)    Type II diabetes mellitus with renal manifestations (HCC)    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   CAD, multiple vessel 08/25/2020   Non-ST elevation (NSTEMI) myocardial infarction ([place]) 08/21/2020   History of cardiac arrest 08/21/2020   HFrEF (heart failure with reduced ejection fraction) ([place])    History of sustained ventricular fibrillation 08/18/2020   Acute respiratory failure with hypoxia (HCC)    Chronic diastolic heart failure (HCC)    Stage 3a chronic kidney disease ([place]) 11/20/2019   UTI (urinary tract infection) 07/26/2019   HTN (hypertension) 07/26/2019   Diabetes ([place]) 07/26/2019   Unstable angina ([place]) 12/03/2008   Status post percutaneous transluminal coronary angioplasty 10/08/2008   Malignant neoplasm of prostate ([place]) 10/05/2005   Essential hypertension 08/24/1989   PCP:  Tomasita Morrow, MD Pharmacy:   [id number] [address], [place] - [phone]  [place] [address] [address] Phone: [phone] Fax: [phone]     Social Determinants of Health (SDOH) Interventions    Readmission Risk Interventions Readmission Risk Prevention Plan 10/29/2021 09/10/2020 08/28/2020  Transportation Screening Complete - Complete  PCP or Specialist Appt within 3-5 Days Complete - -  HRI or Home Care Consult Complete - Complete  Social Work Consult for [place] Planning/Counseling Complete - Complete  Palliative Care Screening Not Applicable - Not Applicable  Medication Review Press photographer) Complete - Complete  PCP or Specialist appointment within 3-5 days of discharge - Complete -  [place] or [place] - Complete -   SW Recovery Care/Counseling Consult - Complete -  Palliative Care Screening - Not Applicable -  [place] - Not Applicable -  Some recent data might be hidden

## 2022-01-05 NOTE — Assessment & Plan Note (Signed)
Blood pressure within goal. -Continue with Coreg, hydralazine and Cozaar.

## 2022-01-05 NOTE — Assessment & Plan Note (Signed)
Seems well controlled.  Recent A1c of 6.8.  Patient was taking Levemir 15 units daily at home. -Continue with SSI -Continue with 10 units of Semglee daily

## 2022-01-05 NOTE — Assessment & Plan Note (Signed)
Clinical picture consistent with acute on chronic HFrEF. Repeat echo with  Good urinary output, no intake recorded.  Renal functions improving -Continue with IV Lasix 60 mg twice daily -Continue with daily weight and BMP -Strict intake and output -Fluid restriction

## 2022-01-05 NOTE — Hospital Course (Addendum)
Taken from H&P.  [name] is a 86 y.o. male with medical history significant of sCHF with EF 35-40, HTN, HLD, DM, GERD, CKD stage IIIa, CAD, CABG, cardiac arrest, who presents with shortness of breath.   Patient states that he has been having progressively shortness of breath for more than 3 days, particularly on exertion.  He has chest pressure, no active chest pain.  He has cough with clear mucus production.  No fever or chills.  No urinary symptoms.  On arrival to ED he was hemodynamically stable, labs only pertinent for elevated BNP at 2093.  Chest x-ray shows interstitial pulmonary edema He was started on IV Lasix, repeat echo ordered, which showed EF of 30 to 35%, global hypokinesis and grade 3 diastolic dysfunction.  More consistent with dilated cardiomyopathy.

## 2022-01-05 NOTE — Assessment & Plan Note (Signed)
Renal function around baseline, some improvement with diuresis. -Monitor renal function -Avoid nephrotoxins

## 2022-01-06 ENCOUNTER — Encounter: Payer: Self-pay | Admitting: Internal Medicine

## 2022-01-06 ENCOUNTER — Ambulatory Visit: Payer: Medicare HMO | Admitting: Nurse Practitioner

## 2022-01-06 LAB — BASIC METABOLIC PANEL
Anion gap: 7 (ref 5–15)
BUN: 28 mg/dL — ABNORMAL HIGH (ref 8–23)
CO2: 30 mmol/L (ref 22–32)
Calcium: 8.4 mg/dL — ABNORMAL LOW (ref 8.9–10.3)
Chloride: 105 mmol/L (ref 98–111)
Creatinine, Ser: 1.62 mg/dL — ABNORMAL HIGH (ref 0.61–1.24)
GFR, Estimated: 42 mL/min — ABNORMAL LOW (ref >60)
Glucose, Bld: 147 mg/dL — ABNORMAL HIGH (ref 70–99)
Potassium: 3.4 mmol/L — ABNORMAL LOW (ref 3.5–5.1)
Sodium: 142 mmol/L (ref 135–145)

## 2022-01-06 LAB — GLUCOSE, CAPILLARY
Glucose-Capillary: 132 mg/dL — ABNORMAL HIGH (ref 70–99)
Glucose-Capillary: 323 mg/dL — ABNORMAL HIGH (ref 70–99)

## 2022-01-06 MED ORDER — POTASSIUM CHLORIDE CRYS ER 20 MEQ PO TBCR
40.0000 meq | EXTENDED_RELEASE_TABLET | Freq: Once | ORAL | Status: AC
  Administered 2022-01-06: 40 meq via ORAL
  Filled 2022-01-06: qty 2

## 2022-01-06 NOTE — Progress Notes (Signed)
SATURATION QUALIFICATIONS: (This note is used to comply with regulatory documentation for home oxygen) ? ?Patient Saturations on Room Air at Rest = 100% ? ?Patient Saturations on Room Air while Ambulating = 97% ? ?Patient Saturations on 0 Liters of oxygen while Ambulating = 97% ? ?Please briefly explain why patient needs home oxygen: ?Patient does not qualify for home 02 ?

## 2022-01-06 NOTE — Progress Notes (Incomplete)
Progress Note    [name]  GGY:[id number] DOB: 1935/02/25  DOA: 01/04/2022 PCP: Tomasita Morrow, MD      Brief Narrative:    Medical records reviewed and are as summarized below:  [name] is a 87 y.o. male ***    Taken from H&P.  Karl Erway is a 87 y.o. male with medical history significant of sCHF with EF 35-40, HTN, HLD, DM, GERD, CKD stage IIIa, CAD, CABG, cardiac arrest, who presents with shortness of breath.   Patient states that he has been having progressively shortness of breath for more than 3 days, particularly on exertion.  He has chest pressure, no active chest pain.  He has cough with clear mucus production.  No fever or chills.  No urinary symptoms.  On arrival to ED he was hemodynamically stable, labs only pertinent for elevated BNP at 2093.  Chest x-ray shows interstitial pulmonary edema He was started on IV Lasix, repeat echo ordered, which showed EF of 30 to 35%, global hypokinesis and grade 3 diastolic dysfunction.  More consistent with dilated cardiomyopathy.      Assessment/Plan:   Principal Problem:   Acute on chronic systolic CHF (congestive heart failure) (HCC) Active Problems:   HTN (hypertension)   CAD, multiple vessel   Stage 3a chronic kidney disease (HCC)   HLD (hyperlipidemia)   Type II diabetes mellitus with renal manifestations (HCC)          Body mass index is 35.26 kg/m.  Diet Order             Diet 2 gram sodium Room service appropriate? Yes; Fluid consistency: Thin  Diet effective now                        Assessment and Plan: * Acute on chronic systolic CHF (congestive heart failure) ([place])- (present on admission) Clinical picture consistent with acute on chronic HFrEF. Repeat echo with  Good urinary output, no intake recorded.  Renal functions improving -Continue with IV Lasix 60 mg twice daily -Continue with daily weight and BMP -Strict intake and output -Fluid restriction  Type  II diabetes mellitus with renal manifestations ([place])- (present on admission) Seems well controlled.  Recent A1c of 6.8.  Patient was taking Levemir 15 units daily at home. -Continue with SSI -Continue with 10 units of Semglee daily  HLD (hyperlipidemia)- (present on admission) - Continue with statin  Stage 3a chronic kidney disease ([place])- (present on admission) Renal function around baseline, some improvement with diuresis. -Monitor renal function -Avoid nephrotoxins  CAD, multiple vessel- (present on admission) S/p CABG Barely positive troponin with a flat curve, most likely secondary to demand ischemia with acute on chronic HFrEF.  No chest pain. -Continue with Crestor, Plavix and Imdur  HTN (hypertension)- (present on admission) Blood pressure within goal. -Continue with Coreg, hydralazine and Cozaar.            Consultants: ***  Procedures: ***    Medications:    acyclovir  800 mg Oral Daily   carvedilol  3.125 mg Oral BID WC   clopidogrel  75 mg Oral Daily   enoxaparin (LOVENOX) injection  0.5 mg/kg Subcutaneous Q24H   furosemide  60 mg Intravenous Q12H   hydrALAZINE  25 mg Oral TID   insulin aspart  0-5 Units Subcutaneous QHS   insulin aspart  0-9 Units Subcutaneous TID WC   insulin glargine-yfgn  10 Units Subcutaneous QHS   isosorbide mononitrate  30 mg Oral Daily   losartan  50 mg Oral Daily   magnesium oxide  400 mg Oral Daily   multivitamin with minerals  1 tablet Oral Daily   pantoprazole  40 mg Oral BID   rosuvastatin  40 mg Oral Daily   Continuous Infusions:   Anti-infectives (From admission, onward)    Start     Dose/Rate Route Frequency Ordered Stop   01/04/22 2000  acyclovir (ZOVIRAX) 200 MG capsule 800 mg        800 mg Oral Daily 01/04/22 1831                Family Communication/Anticipated D/C date and plan/Code Status   DVT prophylaxis:      Code Status: Full Code  Family Communication: *** Disposition Plan:  ***   Status is: Inpatient {Inpatient:23812}               Subjective:   ***  Objective:    Vitals:   01/06/22 0008 01/06/22 0418 01/06/22 0428 01/06/22 0732  BP: 128/78 139/82  (!) 156/86  Pulse: 66 64  68  Resp: 16 18  19   Temp: 98.1 F (36.7 C) 98 F (36.7 C)  98.2 F (36.8 C)  TempSrc:      SpO2: 100% 98%  99%  Weight:   105.2 kg   Height:       No data found.   Intake/Output Summary (Last 24 hours) at 01/06/2022 1048 Last data filed at 01/06/2022 0956 Gross per 24 hour  Intake 420 ml  Output 3000 ml  Net -2580 ml   Filed Weights   01/04/22 0735 01/05/22 0439 01/06/22 0428  Weight: 105.2 kg 106.1 kg 105.2 kg    Exam:  GEN: NAD SKIN: No rash*** EYES: EOMI*** ENT: MMM CV: RRR PULM: CTA B ABD: soft, abdominal hernia, NT, +BS CNS: AAO x 3, non focal EXT: No edema or tenderness***        Data Reviewed:   I have personally reviewed following labs and imaging studies:  Labs: Labs show the following:   Basic Metabolic Panel: Recent Labs  Lab 01/04/22 0739 01/05/22 0613 01/06/22 0620  NA 136 141 142  K 3.7 3.7 3.4*  CL 103 108 105  CO2 23 28 30   GLUCOSE 275* 167* 147*  BUN 32* 30* 28*  CREATININE 1.64* 1.47* 1.62*  CALCIUM 8.4* 8.2* 8.4*  MG  --  2.0  --    GFR Estimated Creatinine Clearance: 39.9 mL/min (A) (by C-G formula based on SCr of 1.62 mg/dL (H)). Liver Function Tests: Recent Labs  Lab 01/04/22 0739  AST 39  ALT 42  ALKPHOS 81  BILITOT 0.3  PROT 6.4*  ALBUMIN 3.3*   No results for input(s): LIPASE, AMYLASE in the last 168 hours. No results for input(s): AMMONIA in the last 168 hours. Coagulation profile No results for input(s): INR, PROTIME in the last 168 hours.  CBC: Recent Labs  Lab 01/04/22 0739  WBC 8.6  HGB 13.6  HCT 45.5  MCV 82.7  PLT 211   Cardiac Enzymes: No results for input(s): CKTOTAL, CKMB, CKMBINDEX, TROPONINI in the last 168 hours. BNP (last 3 results) No results for input(s):  PROBNP in the last 8760 hours. CBG: Recent Labs  Lab 01/05/22 0850 01/05/22 1148 01/05/22 1556 01/05/22 2100 01/06/22 0732  GLUCAP 159* 244* 156* 221* 132*   D-Dimer: No results for input(s): DDIMER in the last 72 hours. Hgb A1c: No results for input(s): HGBA1C in  the last 72 hours. Lipid Profile: No results for input(s): CHOL, HDL, LDLCALC, TRIG, CHOLHDL, LDLDIRECT in the last 72 hours. Thyroid function studies: No results for input(s): TSH, T4TOTAL, T3FREE, THYROIDAB in the last 72 hours.  Invalid input(s): FREET3 Anemia work up: No results for input(s): VITAMINB12, FOLATE, FERRITIN, TIBC, IRON, RETICCTPCT in the last 72 hours. Sepsis Labs: Recent Labs  Lab 01/04/22 0739  WBC 8.6    Microbiology Recent Results (from the past 240 hour(s))  Resp Panel by RT-PCR (Flu A&B, Covid) Nasopharyngeal Swab     Status: None   Collection Time: 01/04/22  7:39 AM   Specimen: Nasopharyngeal Swab; Nasopharyngeal(NP) swabs in vial transport medium  Result Value Ref Range Status   SARS Coronavirus 2 by RT PCR NEGATIVE NEGATIVE Final    Comment: (NOTE) SARS-CoV-2 target nucleic acids are NOT DETECTED.  The SARS-CoV-2 RNA is generally detectable in upper respiratory specimens during the acute phase of infection. The lowest concentration of SARS-CoV-2 viral copies this assay can detect is 138 copies/mL. A negative result does not preclude SARS-Cov-2 infection and should not be used as the sole basis for treatment or other patient management decisions. A negative result may occur with  improper specimen collection/handling, submission of specimen other than nasopharyngeal swab, presence of viral mutation(s) within the areas targeted by this assay, and inadequate number of viral copies(<138 copies/mL). A negative result must be combined with clinical observations, patient history, and epidemiological information. The expected result is Negative.  Fact Sheet for Patients:   EntrepreneurPulse.com.au  Fact Sheet for Healthcare Providers:  IncredibleEmployment.be  This test is no t yet approved or cleared by the [place] FDA and  has been authorized for detection and/or diagnosis of SARS-CoV-2 by FDA under an Emergency Use Authorization (EUA). This EUA will remain  in effect (meaning this test can be used) for the duration of the COVID-19 declaration under Section 564(b)(1) of the Act, 21 U.S.C.section 360bbb-3(b)(1), unless the authorization is terminated  or revoked sooner.       Influenza A by PCR NEGATIVE NEGATIVE Final   Influenza B by PCR NEGATIVE NEGATIVE Final    Comment: (NOTE) The Xpert Xpress SARS-CoV-2/FLU/RSV plus assay is intended as an aid in the diagnosis of influenza from Nasopharyngeal swab specimens and should not be used as a sole basis for treatment. Nasal washings and aspirates are unacceptable for Xpert Xpress SARS-CoV-2/FLU/RSV testing.  Fact Sheet for Patients: EntrepreneurPulse.com.au  Fact Sheet for Healthcare Providers: IncredibleEmployment.be  This test is not yet approved or cleared by the [place] FDA and has been authorized for detection and/or diagnosis of SARS-CoV-2 by FDA under an Emergency Use Authorization (EUA). This EUA will remain in effect (meaning this test can be used) for the duration of the COVID-19 declaration under Section 564(b)(1) of the Act, 21 U.S.C. section 360bbb-3(b)(1), unless the authorization is terminated or revoked.  Performed at [facility], [address] [place], [place] [address]     Procedures and diagnostic studies:  ECHOCARDIOGRAM COMPLETE  Result Date: 01/05/2022    ECHOCARDIOGRAM REPORT   Patient Name:   CANE DUBRAY Date of Exam: 01/05/2022 Medical Rec #:  [id number]    Height:       68.0 in Accession #:    [id number]   Weight:       233.9 lb Date of Birth:  02-24-1935   BSA:           2.184 m Patient Age:    30 years  BP:           130/78 mmHg Patient Gender: M            HR:           65 bpm. Exam Location:  [facility] Procedure: Cardiac Doppler, Color Doppler and 2D Echo Indications:     CHF- acute systolic I62.70  History:         Patient has prior history of Echocardiogram examinations, most                  recent 08/27/2021. CHF and Cardiomyopathy, CAD; Risk                  Factors:Hypertension. AV mobitz 1.  Sonographer:     Sherrie Sport Referring Phys:  [id number] Soledad Gerlach NIU Diagnosing Phys: Nelva Bush MD  Sonographer Comments: Suboptimal apical window. IMPRESSIONS  1. Left ventricular ejection fraction, by estimation, is 30 to 35%. The left ventricle has moderately decreased function. The left ventricle demonstrates global hypokinesis. There is mild left ventricular hypertrophy. Left ventricular diastolic parameters are consistent with Grade III diastolic dysfunction (restrictive). Elevated left atrial pressure.  2. Right ventricular systolic function is moderately reduced. The right ventricular size is mildly enlarged. Mildly increased right ventricular wall thickness. Tricuspid regurgitation signal is inadequate for assessing PA pressure.  3. Left atrial size was mildly dilated.  4. The mitral valve is degenerative. Mild mitral valve regurgitation. No evidence of mitral stenosis.  5. The aortic valve is tricuspid. There is moderate calcification of the aortic valve. There is mild thickening of the aortic valve. Aortic valve regurgitation is not visualized. Aortic valve sclerosis/calcification is present, without any evidence of aortic stenosis. FINDINGS  Left Ventricle: Left ventricular ejection fraction, by estimation, is 30 to 35%. The left ventricle has moderately decreased function. The left ventricle demonstrates global hypokinesis. The left ventricular internal cavity size was normal in size. There is mild left ventricular hypertrophy. Left ventricular diastolic parameters are  consistent with Grade III diastolic dysfunction (restrictive). Elevated left atrial pressure. Right Ventricle: The right ventricular size is mildly enlarged. Mildly increased right ventricular wall thickness. Right ventricular systolic function is moderately reduced. Tricuspid regurgitation signal is inadequate for assessing PA pressure. Left Atrium: Left atrial size was mildly dilated. Right Atrium: Right atrial size was normal in size. Pericardium: The pericardium was not well visualized. Mitral Valve: The mitral valve is degenerative in appearance. There is mild thickening of the mitral valve leaflet(s). Mild to moderate mitral annular calcification. Mild mitral valve regurgitation. No evidence of mitral valve stenosis. Tricuspid Valve: The tricuspid valve is not well visualized. Tricuspid valve regurgitation is trivial. Aortic Valve: The aortic valve is tricuspid. There is moderate calcification of the aortic valve. There is mild thickening of the aortic valve. Aortic valve regurgitation is not visualized. Aortic valve sclerosis/calcification is present, without any evidence of aortic stenosis. Aortic valve mean gradient measures 2.7 mmHg. Aortic valve peak gradient measures 4.9 mmHg. Aortic valve area, by VTI measures 2.05 cm. Pulmonic Valve: The pulmonic valve was not well visualized. Pulmonic valve regurgitation is not visualized. No evidence of pulmonic stenosis. Aorta: The aortic root is normal in size and structure. Pulmonary Artery: The pulmonary artery is not well seen. IAS/Shunts: The interatrial septum was not well visualized. Additional Comments: There is pleural effusion in the left lateral region.  LEFT VENTRICLE PLAX 2D LVIDd:         5.40 cm      Diastology LVIDs:  4.90 cm      LV e' medial:    3.37 cm/s LV PW:         1.17 cm      LV E/e' medial:  29.2 LV IVS:        1.20 cm      LV e' lateral:   7.72 cm/s LVOT diam:     2.00 cm      LV E/e' lateral: 12.8 LV SV:         41 LV SV Index:    19 LVOT Area:     3.14 cm  LV Volumes (MOD) LV vol d, MOD A2C: 135.0 ml LV vol d, MOD A4C: 123.0 ml LV vol s, MOD A2C: 108.0 ml LV vol s, MOD A4C: 85.1 ml LV SV MOD A2C:     27.0 ml LV SV MOD A4C:     123.0 ml LV SV MOD BP:      38.9 ml RIGHT VENTRICLE RV Basal diam:  4.90 cm RV S prime:     8.49 cm/s TAPSE (M-mode): 1.4 cm LEFT ATRIUM             Index        RIGHT ATRIUM           Index LA diam:        4.40 cm 2.01 cm/m   RA Area:     15.60 cm LA Vol (A2C):   86.0 ml 39.37 ml/m  RA Volume:   40.40 ml  18.49 ml/m LA Vol (A4C):   73.7 ml 33.74 ml/m LA Biplane Vol: 79.8 ml 36.53 ml/m  AORTIC VALVE                    PULMONIC VALVE AV Area (Vmax):    1.83 cm     PV Vmax:        0.57 m/s AV Area (Vmean):   1.85 cm     PV Vmean:       38.700 cm/s AV Area (VTI):     2.05 cm     PV VTI:         0.120 m AV Vmax:           110.90 cm/s  PV Peak grad:   1.3 mmHg AV Vmean:          74.933 cm/s  PV Mean grad:   1.0 mmHg AV VTI:            0.198 m      RVOT Peak grad: 2 mmHg AV Peak Grad:      4.9 mmHg AV Mean Grad:      2.7 mmHg LVOT Vmax:         64.60 cm/s LVOT Vmean:        44.200 cm/s LVOT VTI:          0.129 m LVOT/AV VTI ratio: 0.65  AORTA Ao Root diam: 3.27 cm MITRAL VALVE MV Area (PHT): 4.01 cm    SHUNTS MV Decel Time: 189 msec    Systemic VTI:  0.13 m MV E velocity: 98.50 cm/s  Systemic Diam: 2.00 cm MV A velocity: 38.60 cm/s  Pulmonic VTI:  0.125 m MV E/A ratio:  2.55 Harrell Gave End MD Electronically signed by Nelva Bush MD Signature Date/Time: 01/05/2022/1:27:50 PM    Final                LOS: 2 days   Jennye Boroughs  [facility]  Pager on [email or web address]. If 7PM-7AM, please contact night-coverage at [email or web address]     01/06/2022, 10:48 AM

## 2022-01-07 NOTE — Discharge Summary (Addendum)
Physician Discharge Summary  [name]:[id number] DOB: 09-Nov-1934 DOA: 01/04/2022  PCP: Tomasita Morrow, MD  Admit date: 01/04/2022 Discharge date: 01/06/2022  Discharge disposition: Home   Recommendations for Outpatient Follow-Up:   Follow-up with PCP in 1 week   Discharge Diagnosis:   Principal Problem:   Acute on chronic systolic CHF (congestive heart failure) (HCC) Active Problems:   HTN (hypertension)   CAD, multiple vessel   Stage 3a chronic kidney disease ([place])   HLD (hyperlipidemia)   Type II diabetes mellitus with renal manifestations ([place])    Discharge Condition: Stable.  Diet recommendation:  Diet Order             Diet - low sodium heart healthy           Diet Carb Modified                     Code Status: Prior     Hospital Course:   Mr. [name] is a 87 y.o. male with medical history significant of sCHF with EF 35-40, HTN, HLD, DM, GERD, CKD stage IIIa, CAD, CABG, history of cardiac arrest, who presented to the hospital with shortness of breath.  He was admitted to the hospital for acute exacerbation of chronic systolic and diastolic CHF.  He was treated with IV Lasix.  2D echo showed EF estimated at 30 to 35 and grade 3 diastolic dysfunction.  His condition has improved and he is deemed stable for discharge to home.  He was able to ambulate in the hallway without any symptoms.     Discharge Exam:    Vitals:   01/06/22 0418 01/06/22 0428 01/06/22 0732 01/06/22 1058  BP: 139/82  (!) 156/86 127/68  Pulse: 64  68 69  Resp: 18  19 18   Temp: 98 F (36.7 C)  98.2 F (36.8 C) 98 F (36.7 C)  TempSrc:      SpO2: 98%  99% 99%  Weight:  105.2 kg    Height:         GEN: NAD SKIN: No rash EYES: EOMI ENT: MMM CV: RRR PULM: CTA B ABD: soft, ND, NT, +BS CNS: AAO x 3, non focal EXT: No edema or tenderness   The results of significant diagnostics from this hospitalization (including imaging, microbiology, ancillary  and laboratory) are listed below for reference.     Procedures and Diagnostic Studies:   ECHOCARDIOGRAM COMPLETE  Result Date: 01/05/2022    ECHOCARDIOGRAM REPORT   Patient Name:   [name] Date of Exam: 01/05/2022 Medical Rec #:  [id number]    Height:       68.0 in Accession #:    [id number]   Weight:       233.9 lb Date of Birth:  1934/11/20   BSA:          2.184 m Patient Age:    35 years     BP:           130/78 mmHg Patient Gender: M            HR:           65 bpm. Exam Location:  [facility] Procedure: Cardiac Doppler, Color Doppler and 2D Echo Indications:     CHF- acute systolic Q94.50  History:         Patient has prior history of Echocardiogram examinations, most  recent 08/27/2021. CHF and Cardiomyopathy, CAD; Risk                  Factors:Hypertension. AV mobitz 1.  Sonographer:     Sherrie Sport Referring Phys:  [id number] Soledad Gerlach NIU Diagnosing Phys: Nelva Bush MD  Sonographer Comments: Suboptimal apical window. IMPRESSIONS  1. Left ventricular ejection fraction, by estimation, is 30 to 35%. The left ventricle has moderately decreased function. The left ventricle demonstrates global hypokinesis. There is mild left ventricular hypertrophy. Left ventricular diastolic parameters are consistent with Grade III diastolic dysfunction (restrictive). Elevated left atrial pressure.  2. Right ventricular systolic function is moderately reduced. The right ventricular size is mildly enlarged. Mildly increased right ventricular wall thickness. Tricuspid regurgitation signal is inadequate for assessing PA pressure.  3. Left atrial size was mildly dilated.  4. The mitral valve is degenerative. Mild mitral valve regurgitation. No evidence of mitral stenosis.  5. The aortic valve is tricuspid. There is moderate calcification of the aortic valve. There is mild thickening of the aortic valve. Aortic valve regurgitation is not visualized. Aortic valve sclerosis/calcification is present, without any evidence  of aortic stenosis. FINDINGS  Left Ventricle: Left ventricular ejection fraction, by estimation, is 30 to 35%. The left ventricle has moderately decreased function. The left ventricle demonstrates global hypokinesis. The left ventricular internal cavity size was normal in size. There is mild left ventricular hypertrophy. Left ventricular diastolic parameters are consistent with Grade III diastolic dysfunction (restrictive). Elevated left atrial pressure. Right Ventricle: The right ventricular size is mildly enlarged. Mildly increased right ventricular wall thickness. Right ventricular systolic function is moderately reduced. Tricuspid regurgitation signal is inadequate for assessing PA pressure. Left Atrium: Left atrial size was mildly dilated. Right Atrium: Right atrial size was normal in size. Pericardium: The pericardium was not well visualized. Mitral Valve: The mitral valve is degenerative in appearance. There is mild thickening of the mitral valve leaflet(s). Mild to moderate mitral annular calcification. Mild mitral valve regurgitation. No evidence of mitral valve stenosis. Tricuspid Valve: The tricuspid valve is not well visualized. Tricuspid valve regurgitation is trivial. Aortic Valve: The aortic valve is tricuspid. There is moderate calcification of the aortic valve. There is mild thickening of the aortic valve. Aortic valve regurgitation is not visualized. Aortic valve sclerosis/calcification is present, without any evidence of aortic stenosis. Aortic valve mean gradient measures 2.7 mmHg. Aortic valve peak gradient measures 4.9 mmHg. Aortic valve area, by VTI measures 2.05 cm. Pulmonic Valve: The pulmonic valve was not well visualized. Pulmonic valve regurgitation is not visualized. No evidence of pulmonic stenosis. Aorta: The aortic root is normal in size and structure. Pulmonary Artery: The pulmonary artery is not well seen. IAS/Shunts: The interatrial septum was not well visualized. Additional  Comments: There is pleural effusion in the left lateral region.  LEFT VENTRICLE PLAX 2D LVIDd:         5.40 cm      Diastology LVIDs:         4.90 cm      LV e' medial:    3.37 cm/s LV PW:         1.17 cm      LV E/e' medial:  29.2 LV IVS:        1.20 cm      LV e' lateral:   7.72 cm/s LVOT diam:     2.00 cm      LV E/e' lateral: 12.8 LV SV:         41 LV  SV Index:   19 LVOT Area:     3.14 cm  LV Volumes (MOD) LV vol d, MOD A2C: 135.0 ml LV vol d, MOD A4C: 123.0 ml LV vol s, MOD A2C: 108.0 ml LV vol s, MOD A4C: 85.1 ml LV SV MOD A2C:     27.0 ml LV SV MOD A4C:     123.0 ml LV SV MOD BP:      38.9 ml RIGHT VENTRICLE RV Basal diam:  4.90 cm RV S prime:     8.49 cm/s TAPSE (M-mode): 1.4 cm LEFT ATRIUM             Index        RIGHT ATRIUM           Index LA diam:        4.40 cm 2.01 cm/m   RA Area:     15.60 cm LA Vol (A2C):   86.0 ml 39.37 ml/m  RA Volume:   40.40 ml  18.49 ml/m LA Vol (A4C):   73.7 ml 33.74 ml/m LA Biplane Vol: 79.8 ml 36.53 ml/m  AORTIC VALVE                    PULMONIC VALVE AV Area (Vmax):    1.83 cm     PV Vmax:        0.57 m/s AV Area (Vmean):   1.85 cm     PV Vmean:       38.700 cm/s AV Area (VTI):     2.05 cm     PV VTI:         0.120 m AV Vmax:           110.90 cm/s  PV Peak grad:   1.3 mmHg AV Vmean:          74.933 cm/s  PV Mean grad:   1.0 mmHg AV VTI:            0.198 m      RVOT Peak grad: 2 mmHg AV Peak Grad:      4.9 mmHg AV Mean Grad:      2.7 mmHg LVOT Vmax:         64.60 cm/s LVOT Vmean:        44.200 cm/s LVOT VTI:          0.129 m LVOT/AV VTI ratio: 0.65  AORTA Ao Root diam: 3.27 cm MITRAL VALVE MV Area (PHT): 4.01 cm    SHUNTS MV Decel Time: 189 msec    Systemic VTI:  0.13 m MV E velocity: 98.50 cm/s  Systemic Diam: 2.00 cm MV A velocity: 38.60 cm/s  Pulmonic VTI:  0.125 m MV E/A ratio:  2.55 Harrell Gave End MD Electronically signed by Nelva Bush MD Signature Date/Time: 01/05/2022/1:27:50 PM    Final      Labs:   Basic Metabolic Panel: Recent Labs  Lab  01/04/22 0739 01/05/22 0613 01/06/22 0620  NA 136 141 142  K 3.7 3.7 3.4*  CL 103 108 105  CO2 23 28 30   GLUCOSE 275* 167* 147*  BUN 32* 30* 28*  CREATININE 1.64* 1.47* 1.62*  CALCIUM 8.4* 8.2* 8.4*  MG  --  2.0  --    GFR Estimated Creatinine Clearance: 39.9 mL/min (A) (by C-G formula based on SCr of 1.62 mg/dL (H)). Liver Function Tests: Recent Labs  Lab 01/04/22 0739  AST 39  ALT 42  ALKPHOS 81  BILITOT 0.3  PROT 6.4*  ALBUMIN 3.3*   No results for input(s): LIPASE, AMYLASE in the last 168 hours. No results for input(s): AMMONIA in the last 168 hours. Coagulation profile No results for input(s): INR, PROTIME in the last 168 hours.  CBC: Recent Labs  Lab 01/04/22 0739  WBC 8.6  HGB 13.6  HCT 45.5  MCV 82.7  PLT 211   Cardiac Enzymes: No results for input(s): CKTOTAL, CKMB, CKMBINDEX, TROPONINI in the last 168 hours. BNP: Invalid input(s): POCBNP CBG: Recent Labs  Lab 01/05/22 1148 01/05/22 1556 01/05/22 2100 01/06/22 0732 01/06/22 1249  GLUCAP 244* 156* 221* 132* 323*   D-Dimer No results for input(s): DDIMER in the last 72 hours. Hgb A1c No results for input(s): HGBA1C in the last 72 hours. Lipid Profile No results for input(s): CHOL, HDL, LDLCALC, TRIG, CHOLHDL, LDLDIRECT in the last 72 hours. Thyroid function studies No results for input(s): TSH, T4TOTAL, T3FREE, THYROIDAB in the last 72 hours.  Invalid input(s): FREET3 Anemia work up No results for input(s): VITAMINB12, FOLATE, FERRITIN, TIBC, IRON, RETICCTPCT in the last 72 hours. Microbiology Recent Results (from the past 240 hour(s))  Resp Panel by RT-PCR (Flu A&B, Covid) Nasopharyngeal Swab     Status: None   Collection Time: 01/04/22  7:39 AM   Specimen: Nasopharyngeal Swab; Nasopharyngeal(NP) swabs in vial transport medium  Result Value Ref Range Status   SARS Coronavirus 2 by RT PCR NEGATIVE NEGATIVE Final    Comment: (NOTE) SARS-CoV-2 target nucleic acids are NOT  DETECTED.  The SARS-CoV-2 RNA is generally detectable in upper respiratory specimens during the acute phase of infection. The lowest concentration of SARS-CoV-2 viral copies this assay can detect is 138 copies/mL. A negative result does not preclude SARS-Cov-2 infection and should not be used as the sole basis for treatment or other patient management decisions. A negative result may occur with  improper specimen collection/handling, submission of specimen other than nasopharyngeal swab, presence of viral mutation(s) within the areas targeted by this assay, and inadequate number of viral copies(<138 copies/mL). A negative result must be combined with clinical observations, patient history, and epidemiological information. The expected result is Negative.  Fact Sheet for Patients:  EntrepreneurPulse.com.au  Fact Sheet for Healthcare Providers:  IncredibleEmployment.be  This test is no t yet approved or cleared by the [place] FDA and  has been authorized for detection and/or diagnosis of SARS-CoV-2 by FDA under an Emergency Use Authorization (EUA). This EUA will remain  in effect (meaning this test can be used) for the duration of the COVID-19 declaration under Section 564(b)(1) of the Act, 21 U.S.C.section 360bbb-3(b)(1), unless the authorization is terminated  or revoked sooner.       Influenza A by PCR NEGATIVE NEGATIVE Final   Influenza B by PCR NEGATIVE NEGATIVE Final    Comment: (NOTE) The Xpert Xpress SARS-CoV-2/FLU/RSV plus assay is intended as an aid in the diagnosis of influenza from Nasopharyngeal swab specimens and should not be used as a sole basis for treatment. Nasal washings and aspirates are unacceptable for Xpert Xpress SARS-CoV-2/FLU/RSV testing.  Fact Sheet for Patients: EntrepreneurPulse.com.au  Fact Sheet for Healthcare Providers: IncredibleEmployment.be  This test is not yet  approved or cleared by the [place] FDA and has been authorized for detection and/or diagnosis of SARS-CoV-2 by FDA under an Emergency Use Authorization (EUA). This EUA will remain in effect (meaning this test can be used) for the duration of the COVID-19 declaration under Section 564(b)(1) of the Act, 21 U.S.C. section 360bbb-3(b)(1), unless the authorization  is terminated or revoked.  Performed at [facility], [address], [place], [place] [address]      Discharge Instructions:   Discharge Instructions     Diet - low sodium heart healthy   Complete by: As directed    Diet Carb Modified   Complete by: As directed    Increase activity slowly   Complete by: As directed       Allergies as of 01/06/2022       Reactions   Angiotensin Receptor Blockers    hyperkalemia   Metformin Diarrhea   Spironolactone    Hyperkalemia        Medication List     TAKE these medications    acetaminophen 500 MG tablet Commonly known as: TYLENOL Take 500-1,000 mg by mouth every 6 (six) hours as needed for mild pain or moderate pain.   acyclovir 800 MG tablet Commonly known as: ZOVIRAX Take 800 mg by mouth daily.   albuterol 108 (90 Base) MCG/ACT inhaler Commonly known as: VENTOLIN HFA Inhale 2 puffs into the lungs every 6 (six) hours as needed for wheezing or shortness of breath.   carvedilol 3.125 MG tablet Commonly known as: COREG Take 1 tablet (3.125 mg total) by mouth 2 (two) times daily with a meal.   clopidogrel 75 MG tablet Commonly known as: PLAVIX TAKE 1 TABLET BY MOUTH EVERY DAY   furosemide 80 MG tablet Commonly known as: LASIX Take 1 tablet (80 mg total) by mouth daily.   hydrALAZINE 25 MG tablet Commonly known as: APRESOLINE Take 25 mg by mouth 3 (three) times daily.   insulin detemir 100 UNIT/ML injection Commonly known as: LEVEMIR Inject 0.15 mLs (15 Units total) into the skin at bedtime.   isosorbide mononitrate 30 MG 24 hr  tablet Commonly known as: IMDUR Take 1 tablet (30 mg total) by mouth daily.   losartan 50 MG tablet Commonly known as: COZAAR Take 50 mg by mouth daily.   magnesium oxide 400 MG tablet Commonly known as: MAG-OX TAKE 1 TABLET BY MOUTH EVERY DAY   metolazone 2.5 MG tablet Commonly known as: ZAROXOLYN Take 1 tablet (2.5) mg 1 hour before Lasix weekly on Tuesdays   multivitamin with minerals Tabs tablet Take 1 tablet by mouth daily.   pantoprazole 40 MG tablet Commonly known as: PROTONIX Take 40 mg by mouth 2 (two) times daily.   rosuvastatin 40 MG tablet Commonly known as: CRESTOR TAKE 1 TABLET BY MOUTH EVERY DAY           If you experience worsening of your admission symptoms, develop shortness of breath, life threatening emergency, suicidal or homicidal thoughts you must seek medical attention immediately by calling 911 or calling your MD immediately  if symptoms less severe.   You must read complete instructions/literature along with all the possible adverse reactions/side effects for all the medicines you take and that have been prescribed to you. Take any new medicines after you have completely understood and accept all the possible adverse reactions/side effects.    Please note   You were cared for by a hospitalist during your hospital stay. If you have any questions about your discharge medications or the care you received while you were in the hospital after you are discharged, you can call the unit and asked to speak with the hospitalist on call if the hospitalist that took care of you is not available. Once you are discharged, your primary care physician will handle any further medical issues.  Please note that NO REFILLS for any discharge medications will be authorized once you are discharged, as it is imperative that you return to your primary care physician (or establish a relationship with a primary care physician if you do not have one) for your aftercare needs so  that they can reassess your need for medications and monitor your lab values.       Time coordinating discharge: Greater than 30 minutes  Signed:  Nishan Ovens  [facility] 01/07/2022, 8:56 PM   Pager on [email or web address]. If 7PM-7AM, please contact night-coverage at [email or web address]

## 2022-01-13 NOTE — Progress Notes (Deleted)
Patient ID: [name], male    DOB: 07-12-1935, 87 y.o.   MRN: [id number]  HPI  [name] is a 87 y/o male with a history of heart block, CAD, DM, hyperlipidemia, HTN, pacemaker implantation, CKD, previous tobacco use and chronic heart failure.   Echo report from 08/27/21 reviewed and showed an EF of 35-40% along with mild LAE.   RHC/LHC done 08/31/21 & showed: Severe underlying three-vessel coronary artery disease with patent grafts including LIMA to LAD, SVG to large diagonal, sequential SVG to OM /distal left circumflex and SVG to right PDA. 2.  Right heart catheterization showed normal right and left-sided filling pressures, minimal pulmonary hypertension and normal cardiac output.  Admitted 10/27/21 due to chest pain and shortness of breath. Initially given IV lasix with transition to oral diuretics. Cardiology consult obtained. Discharged after 3 days. Admitted 10/12/21 due to getting pacemaker implanted. Discharged the next day. Was in the ED 09/13/21 due to acute on chronic HF where he was evaluated and released.   He presents today for a follow-up visit with a chief complaint of minimal shortness of breath upon moderate exertion. He describes this as chronic in nature having been present for several years although has improved since he was last here. He has associated fatigue and pedal edema along with this. He denies any difficulty sleeping, dizziness, abdominal distention, palpitations, chest pain, cough or weight gain.   Does not have carvedilol bottles with him and he's unsure if he has been taking it.   Past Medical History:  Diagnosis Date   AV block, Mobitz 1    CAD (coronary artery disease)    a. 08/2019 VF arrest-->sev 3vd on cath-->CABGx5 (LIMA->LAD, VG->OM->LCX, VG->RPDA, VG->Diag); b. 08/2021 Cath: sev native multivessel dzs w/ 5/5 patent grafts. Nl filling pressures->Med rx.   CKD (chronic kidney disease), stage III (HCC)    Diabetes mellitus without complication  ([place])    HFrEF (heart failure with reduced ejection fraction) ([place])    a. 08/2020 Echo: EF 40-45%; b. 11/2020 Echo: EF 55%, no rwma, mild LVH, Gr2 DD. nl RV fxn; c. 08/2021 Echo: EF 35-40%, glob HK w/ ant/antsept/apical HK. Reduced RV fxn. Mildly dil LA; d. 10/2021 s/p CRT-P.   Hyperlipidemia LDL goal <70    Hypertension    Intermittent Complete heart block ([place])    a. 08/2021 noted on Zio; b. 10/2021 s/p Abbott Allure RF CRT-P (Ser # [id number]).   Ischemic cardiomyopathy    a. 08/2020 Echo: EF 40-45%; b. 11/2020 Echo: EF 55%; c. 08/2021 Echo: EF 35-40%; d. 10/2021 s/p CRT-P.   Trifascicular block    Past Surgical History:  Procedure Laterality Date   BACK SURGERY     CARDIAC CATHETERIZATION     CLIPPING OF ATRIAL APPENDAGE N/A 08/29/2020   Procedure: CLIPPING OF ATRIAL APPENDAGE USING ATRICURE 75 MM ATRICLIP FLEX-V;  Surgeon: Grace Isaac, MD;  Location: [place];  Service: Open Heart Surgery;  Laterality: N/A;   CORONARY ARTERY BYPASS GRAFT N/A 08/29/2020   Procedure: CORONARY ARTERY BYPASS GRAFTING (CABG) X 5 USING LEFT INTERNAL MAMMARY ARTERY AND ENDOSCOPICALLY HARVESTED RIGHT GREATER SAPHENOUS VEIN. LIMA TO LAD, SVG TO OM SEQ TO CIRC, SVG TO PD, SVG TO DIAG.;  Surgeon: Grace Isaac, MD;  Location: [place];  Service: Open Heart Surgery;  Laterality: N/A;   ENDOVEIN HARVEST OF GREATER SAPHENOUS VEIN Right 08/29/2020   Procedure: ENDOVEIN HARVEST OF GREATER SAPHENOUS VEIN;  Surgeon: Grace Isaac, MD;  Location: [place];  Service: Open Heart Surgery;  Laterality: Right;   HERNIA REPAIR     LEFT HEART CATH AND CORONARY ANGIOGRAPHY N/A 08/25/2020   Procedure: LEFT HEART CATH AND CORONARY ANGIOGRAPHY;  Surgeon: [name], MD;  Location: [place] [facility];  Service: Cardiovascular;  Laterality: N/A;   PACEMAKER IMPLANT N/A 10/12/2021   Procedure: PACEMAKER IMPLANT;  Surgeon: Vickie Epley, MD;  Location: [place] [facility];  Service: Cardiovascular;  Laterality: N/A;    RIGHT/LEFT HEART CATH AND CORONARY ANGIOGRAPHY N/A 08/31/2021   Procedure: RIGHT/LEFT HEART CATH AND CORONARY ANGIOGRAPHY;  Surgeon: [name], MD;  Location: [place] [facility];  Service: Cardiovascular;  Laterality: N/A;   TEE WITHOUT CARDIOVERSION N/A 08/29/2020   Procedure: TRANSESOPHAGEAL ECHOCARDIOGRAM (TEE);  Surgeon: Grace Isaac, MD;  Location: [facility];  Service: Open Heart Surgery;  Laterality: N/A;    Family History  Problem Relation Age of Onset   Heart attack Mother    Social History   Tobacco Use   Smoking status: Former    Types: Cigarettes    Quit date: 07/26/1996    Years since quitting: 25.4   Smokeless tobacco: Never  Substance Use Topics   Alcohol use: Not Currently   Allergies  Allergen Reactions   Angiotensin Receptor Blockers     hyperkalemia   Metformin Diarrhea   Spironolactone     Hyperkalemia    Prior to Admission medications   Medication Sig Start Date End Date Taking? Authorizing Provider  acetaminophen (TYLENOL) 500 MG tablet Take 500-1,000 mg by mouth every 6 (six) hours as needed for mild pain or moderate pain.    Yes [provider]  acyclovir (ZOVIRAX) 800 MG tablet Take 800 mg by mouth daily. 12/02/20  Yes [provider]  clopidogrel (PLAVIX) 75 MG tablet TAKE 1 TABLET BY MOUTH EVERY DAY 05/17/21  Yes Loel Dubonnet, NP  fluticasone (FLOVENT HFA) 44 MCG/ACT inhaler Inhale 2 puffs into the lungs in the morning and at bedtime.   Yes [provider]  furosemide (LASIX) 80 MG tablet Take 1 tablet (80 mg total) by mouth daily. 10/30/21 01/28/22 Yes Enzo Bi, MD  hydrALAZINE (APRESOLINE) 25 MG tablet Take 1 tablet (25 mg total) by mouth 3 (three) times daily. 10/30/21 01/28/22 Yes Enzo Bi, MD  insulin detemir (LEVEMIR) 100 UNIT/ML injection Inject 0.15 mLs (15 Units total) into the skin at bedtime. 10/30/21 01/28/22 Yes Enzo Bi, MD  magnesium oxide (MAG-OX) 400 MG tablet TAKE 1 TABLET BY MOUTH EVERY DAY  06/01/21  Yes [name], MD  Multiple Vitamin (MULTIVITAMIN WITH MINERALS) TABS tablet Take 1 tablet by mouth daily. 09/11/20  Yes Gold, Wayne E, PA-C  pantoprazole (PROTONIX) 40 MG tablet Take 40 mg by mouth 2 (two) times daily.   Yes [provider]  rosuvastatin (CRESTOR) 40 MG tablet Take 1 tablet (40 mg total) by mouth daily. 06/17/21  Yes [name], MD  albuterol (VENTOLIN HFA) 108 (90 Base) MCG/ACT inhaler Inhale 2 puffs into the lungs every 6 (six) hours as needed for wheezing or shortness of breath. Patient not taking: Reported on 11/25/2021 09/30/21   [provider]  carvedilol (COREG) 3.125 MG tablet Take 1 tablet (3.125 mg total) by mouth 2 (two) times daily with a meal. Patient not taking: Reported on 11/25/2021 11/12/21 02/10/22  [name], MD  isosorbide mononitrate (IMDUR) 30 MG 24 hr tablet Take 1 tablet (30 mg total) by mouth daily. 11/25/21 02/23/22  Hackney,  Aura Fey, FNP  metolazone (ZAROXOLYN) 2.5 MG tablet Take 1 tablet (2.5) mg 1 hour before Lasix weekly on Tuesdays 11/25/21   Alisa Graff, FNP   Review of Systems  Constitutional:  Positive for fatigue. Negative for appetite change.  HENT:  Negative for congestion, postnasal drip and sore throat.   Eyes: Negative.   Respiratory:  Positive for shortness of breath ("improving"). Negative for cough and chest tightness.   Cardiovascular:  Positive for leg swelling (improving). Negative for chest pain and palpitations.  Gastrointestinal:  Negative for abdominal distention and abdominal pain.  Endocrine: Negative.   Genitourinary: Negative.   Musculoskeletal:  Negative for back pain and neck pain.  Skin: Negative.   Allergic/Immunologic: Negative.   Neurological:  Negative for dizziness and light-headedness.  Hematological:  Negative for adenopathy. Does not bruise/bleed easily.  Psychiatric/Behavioral:  Negative for dysphoric mood and sleep disturbance (sleeping on 1 pillows). The patient is  not nervous/anxious.    There were no vitals filed for this visit.  Wt Readings from Last 3 Encounters:  01/06/22 231 lb 14.8 oz (105.2 kg)  12/08/21 237 lb (107.5 kg)  11/25/21 230 lb 6 oz (104.5 kg)   Lab Results  Component Value Date   CREATININE 1.62 (H) 01/06/2022   CREATININE 1.47 (H) 01/05/2022   CREATININE 1.64 (H) 01/04/2022   Physical Exam Vitals and nursing note reviewed.  Constitutional:      Appearance: He is well-developed.     Interventions: He is not intubated. HENT:     Head: Normocephalic and atraumatic.  Neck:     Vascular: No JVD.  Cardiovascular:     Rate and Rhythm: Bradycardia present. Rhythm irregular.  Pulmonary:     Effort: Pulmonary effort is normal. No tachypnea or accessory muscle usage. He is not intubated.  Abdominal:     Palpations: Abdomen is soft.     Tenderness: There is no abdominal tenderness.  Musculoskeletal:     Cervical back: Neck supple.     Right lower leg: No tenderness. Edema (trace pitting) present.     Left lower leg: No tenderness. Edema (trace pitting) present.  Skin:    General: Skin is warm and dry.  Neurological:     General: No focal deficit present.     Mental Status: He is alert and oriented to person, place, and time.  Psychiatric:        Mood and Affect: Mood normal.        Behavior: Behavior normal.    Assessment & Plan:  1: Chronic heart failure with reduced ejection fraction- - NYHA class II - euvolemic today - weighing daily; reminded to call for an overnight weight gain of >2 pounds or a weekly weight gain of > 5 pounds - weight down 7 pounds from last visit here 2 months ago - not adding salt and he says that his wife doesn't cook with salt either - had pacemaker implanted 10/12/21 - had hyperkalemia with spironolactone - patient to go home and see if carvedilol bottle is there; pharmacy was called and stated someone picked up the RX end of December; if he can't find it, instructed to call Dr. Tyrell Antonio  office - taking metolazone weekly - BNP 10/27/21 was 1801.4 - PharmD reconciled medications with the patient  2: HTN with CKD- - BP mildly elevated (134/95) - saw PCP 11/17/21 - BMP 11/04/21 reviewed and showed sodium 143, potassium 3.9, creatinine 2.13 and GFR 30  3: Type 2 DM- - A1c 10/28/21  was 6.8%  4: CAD- - saw cardiology Fletcher Anon) 11/12/21  5: Lymphedema-  - stage 2 - tries to elevate legs when sitting for long periods of time - wearing compression socks with improvement of swelling - consider compression boots if edema worsens   Mediation bottles reviewed.   Return in 3 months or sooner for any questions/problems before then.

## 2022-01-14 ENCOUNTER — Ambulatory Visit: Payer: Medicare HMO | Admitting: Family

## 2022-01-20 ENCOUNTER — Ambulatory Visit (INDEPENDENT_AMBULATORY_CARE_PROVIDER_SITE_OTHER): Payer: Medicare HMO | Admitting: Cardiology

## 2022-01-20 ENCOUNTER — Other Ambulatory Visit: Payer: Self-pay

## 2022-01-20 ENCOUNTER — Encounter: Payer: Self-pay | Admitting: Cardiology

## 2022-01-20 VITALS — BP 110/70 | HR 60 | Ht 68.0 in | Wt 238.5 lb

## 2022-01-20 DIAGNOSIS — Z950 Presence of cardiac pacemaker: Secondary | ICD-10-CM

## 2022-01-20 DIAGNOSIS — I5022 Chronic systolic (congestive) heart failure: Secondary | ICD-10-CM

## 2022-01-20 DIAGNOSIS — I2510 Atherosclerotic heart disease of native coronary artery without angina pectoris: Secondary | ICD-10-CM | POA: Diagnosis not present

## 2022-01-20 MED ORDER — METOLAZONE 2.5 MG PO TABS: ORAL_TABLET | ORAL | 1 refills | Status: DC

## 2022-01-20 MED ORDER — MAGNESIUM OXIDE 400 MG PO TABS: 1.0000 | ORAL_TABLET | Freq: Every day | ORAL | 1 refills | Status: DC

## 2022-01-20 NOTE — Progress Notes (Signed)
?Electrophysiology Office Follow up Visit Note:   ? ?Date:  01/20/2022  ? ?ID:  [name], DOB Aug 16, 1935, MRN [id number] ? ?PCP:  Duane Morrow, MD  ?[facility] Cardiologist:  Duane Sacramento, MD  ?[facility] Electrophysiologist:  Duane Epley, MD  ? ? ?Interval History:   ? ?[name] is a 87 y.o. male who presents for a follow up visit.  He had a pacemaker system with left bundle area lead implanted on October 12, 2021.  He is done well since the implant.  He does report some swelling in his legs that is chronic. ? ? ? ?  ? ?Past Medical History:  ?Diagnosis Date  ? AV block, Mobitz 1   ? CAD (coronary artery disease)   ? a. 08/2019 VF arrest-->sev 3vd on cath-->CABGx5 (LIMA->LAD, VG->OM->LCX, VG->RPDA, VG->Diag); b. 08/2021 Cath: sev native multivessel dzs w/ 5/5 patent grafts. Nl filling pressures->Med rx.  ? CKD (chronic kidney disease), stage III ([place])   ? Diabetes mellitus without complication ([place])   ? HFrEF (heart failure with reduced ejection fraction) ([place])   ? a. 08/2020 Echo: EF 40-45%; b. 11/2020 Echo: EF 55%, no rwma, mild LVH, Gr2 DD. nl RV fxn; c. 08/2021 Echo: EF 35-40%, glob HK w/ ant/antsept/apical HK. Reduced RV fxn. Mildly dil LA; d. 10/2021 s/p CRT-P.  ? Hyperlipidemia LDL goal <70   ? Hypertension   ? Intermittent Complete heart block ([place])   ? a. 08/2021 noted on Zio; b. 10/2021 s/p Abbott Allure RF CRT-P (Ser # [id number]).  ? Ischemic cardiomyopathy   ? a. 08/2020 Echo: EF 40-45%; b. 11/2020 Echo: EF 55%; c. 08/2021 Echo: EF 35-40%; d. 10/2021 s/p CRT-P.  ? Trifascicular block   ? ? ?Past Surgical History:  ?Procedure Laterality Date  ? BACK SURGERY    ? CARDIAC CATHETERIZATION    ? CLIPPING OF ATRIAL APPENDAGE N/A 08/29/2020  ? Procedure: CLIPPING OF ATRIAL APPENDAGE USING ATRICURE 72 MM ATRICLIP FLEX-V;  Surgeon: Grace Isaac, MD;  Location: [place];  Service: Open Heart Surgery;  Laterality: N/A;  ? CORONARY ARTERY BYPASS GRAFT N/A 08/29/2020  ? Procedure:  CORONARY ARTERY BYPASS GRAFTING (CABG) X 5 USING LEFT INTERNAL MAMMARY ARTERY AND ENDOSCOPICALLY HARVESTED RIGHT GREATER SAPHENOUS VEIN. LIMA TO LAD, SVG TO OM SEQ TO CIRC, SVG TO PD, SVG TO DIAG.;  Surgeon: Grace Isaac, MD;  Location: [facility] [place];  Service: Open Heart Surgery;  Laterality: N/A;  ? ENDOVEIN HARVEST OF GREATER SAPHENOUS VEIN Right 08/29/2020  ? Procedure: ENDOVEIN HARVEST OF GREATER SAPHENOUS VEIN;  Surgeon: Grace Isaac, MD;  Location: [place];  Service: Open Heart Surgery;  Laterality: Right;  ? HERNIA REPAIR    ? LEFT HEART CATH AND CORONARY ANGIOGRAPHY N/A 08/25/2020  ? Procedure: LEFT HEART CATH AND CORONARY ANGIOGRAPHY;  Surgeon: [name], MD;  Location: [place] [facility];  Service: Cardiovascular;  Laterality: N/A;  ? PACEMAKER IMPLANT N/A 10/12/2021  ? Procedure: PACEMAKER IMPLANT;  Surgeon: Duane Epley, MD;  Location: [place] [facility];  Service: Cardiovascular;  Laterality: N/A;  ? RIGHT/LEFT HEART CATH AND CORONARY ANGIOGRAPHY N/A 08/31/2021  ? Procedure: RIGHT/LEFT HEART CATH AND CORONARY ANGIOGRAPHY;  Surgeon: [name], MD;  Location: [place] [facility];  Service: Cardiovascular;  Laterality: N/A;  ? TEE WITHOUT CARDIOVERSION N/A 08/29/2020  ? Procedure: TRANSESOPHAGEAL ECHOCARDIOGRAM (TEE);  Surgeon: Grace Isaac, MD;  Location: [place];  Service: Open Heart Surgery;  Laterality: N/A;  ? ? ?  Current Medications: ?Current Meds  ?Medication Sig  ? acetaminophen (TYLENOL) 500 MG tablet Take 500-1,000 mg by mouth every 6 (six) hours as needed for mild pain or moderate pain.   ? acyclovir (ZOVIRAX) 800 MG tablet Take 800 mg by mouth daily.  ? albuterol (VENTOLIN HFA) 108 (90 Base) MCG/ACT inhaler Inhale 2 puffs into the lungs every 6 (six) hours as needed for wheezing or shortness of breath.  ? carvedilol (COREG) 3.125 MG tablet Take 1 tablet (3.125 mg total) by mouth 2 (two) times daily with a meal.  ? clopidogrel (PLAVIX) 75 MG tablet TAKE 1 TABLET BY  MOUTH EVERY DAY  ? fluticasone (FLOVENT HFA) 110 MCG/ACT inhaler Inhale into the lungs as needed.  ? furosemide (LASIX) 80 MG tablet Take 1 tablet (80 mg total) by mouth daily.  ? hydrALAZINE (APRESOLINE) 25 MG tablet Take 25 mg by mouth 2 times daily at 12 noon and 4 pm.  ? insulin detemir (LEVEMIR) 100 UNIT/ML injection Inject 0.15 mLs (15 Units total) into the skin at bedtime.  ? isosorbide mononitrate (IMDUR) 30 MG 24 hr tablet Take 1 tablet (30 mg total) by mouth daily.  ? losartan (COZAAR) 25 MG tablet Take 1 tablet by mouth daily.  ? Multiple Vitamin (MULTIVITAMIN WITH MINERALS) TABS tablet Take 1 tablet by mouth daily.  ? pantoprazole (PROTONIX) 40 MG tablet Take 40 mg by mouth 2 (two) times daily.  ? rosuvastatin (CRESTOR) 40 MG tablet TAKE 1 TABLET BY MOUTH EVERY DAY  ? [DISCONTINUED] magnesium oxide (MAG-OX) 400 MG tablet TAKE 1 TABLET BY MOUTH EVERY DAY  ? [DISCONTINUED] metolazone (ZAROXOLYN) 2.5 MG tablet Take 1 tablet (2.5) mg 1 hour before Lasix weekly on Tuesdays  ?  ? ?Allergies:   Angiotensin receptor blockers, Metformin, and Spironolactone  ? ?Social History  ? ?Socioeconomic History  ? Marital status: Married  ?  Spouse name: Not on file  ? Number of children: Not on file  ? Years of education: Not on file  ? Highest education level: Not on file  ?Occupational History  ? Not on file  ?Tobacco Use  ? Smoking status: Former  ?  Types: Cigarettes  ?  Quit date: 07/26/1996  ?  Years since quitting: 25.5  ? Smokeless tobacco: Never  ?Vaping Use  ? Vaping Use: Never used  ?Substance and Sexual Activity  ? Alcohol use: Not Currently  ? Drug use: Never  ? Sexual activity: Yes  ?Other Topics Concern  ? Not on file  ?Social History Narrative  ? Not on file  ? ?Social Determinants of Health  ? ?Financial Resource Strain: Not on file  ?Food Insecurity: Not on file  ?Transportation Needs: Not on file  ?Physical Activity: Not on file  ?Stress: Not on file  ?Social Connections: Not on file  ?  ? ?Family  History: ?The patient's family history includes Heart attack in his mother. ? ?ROS:   ?Please see the history of present illness.    ?All other systems reviewed and are negative. ? ?EKGs/Labs/Other Studies Reviewed:   ? ?The following studies were reviewed today: ? ?January 20, 2022 in clinic device interrogation personally reviewed shows stable lead parameters, battery longevity of 7 years.  Reprogrammed outputs to maximize battery longevity.  Programmed DDDR 60-1 20.  AMS episodes with the longest lasting 4 minutes. ? ?EKG:  The ekg ordered today demonstrates AV paced rhythm.  QRS duration 110 ms. ? ?Recent Labs: ?01/04/2022: ALT 42; B Natriuretic Peptide 2,093.8; Hemoglobin 13.6;  Platelets 211 ?01/05/2022: Magnesium 2.0 ?01/06/2022: BUN 28; Creatinine, Ser 1.62; Potassium 3.4; Sodium 142  ?Recent Lipid Panel ?   ?Component Value Date/Time  ? CHOL 114 08/27/2021 0628  ? CHOL 133 05/22/2013 0225  ? TRIG 78 08/27/2021 0628  ? TRIG 68 05/22/2013 0225  ? HDL 45 08/27/2021 0628  ? HDL 50 05/22/2013 0225  ? CHOLHDL 2.5 08/27/2021 3419  ? VLDL 16 08/27/2021 0628  ? VLDL 14 05/22/2013 0225  ? LDLCALC 53 08/27/2021 0628  ? [place] 69 05/22/2013 0225  ? ? ?Physical Exam:   ? ?VS:  BP 110/70 (BP Location: Left Arm, Patient Position: Sitting, Cuff Size: Normal)   Pulse 60   Ht '5\' 8"'$  (1.727 m)   Wt 238 lb 8 oz (108.2 kg)   SpO2 98%   BMI 36.26 kg/m?    ? ?Wt Readings from Last 3 Encounters:  ?01/20/22 238 lb 8 oz (108.2 kg)  ?01/06/22 231 lb 14.8 oz (105.2 kg)  ?12/08/21 237 lb (107.5 kg)  ?  ? ?GEN:  Well nourished, well developed in no acute distress ?HEENT: Normal ?NECK: No JVD; No carotid bruits ?LYMPHATICS: No lymphadenopathy ?CARDIAC: RRR, no murmurs, rubs, gallops.  Pacemaker pocket well-healed. ?RESPIRATORY:  Clear to auscultation without rales, wheezing or rhonchi  ?ABDOMEN: Soft, non-tender, non-distended ?MUSCULOSKELETAL: 2+ bilateral lower extremity pitting edema to the knees; No deformity  ?SKIN: Warm and  dry ?NEUROLOGIC:  Alert and oriented x 3 ?PSYCHIATRIC:  Normal affect  ? ? ? ?  ? ?ASSESSMENT:   ? ?1. Coronary artery disease involving native coronary artery of native heart without angina pectoris   ?2. Chronic systolic hea

## 2022-01-20 NOTE — Patient Instructions (Addendum)
Medication Instructions:  ?- Your physician has recommended you make the following change in your medication:  ? ?1) INCREASE lasix (furosemide) 80 mg: ?- take 1 tablet by mouth TWICE daily x 5 days  ?- then resume 1 tablet by mouth ONCE daily  ?(Do not take the 2nd dose any later than 2 pm) ? ?*If you need a refill on your cardiac medications before your next appointment, please call your pharmacy* ? ? ?Lab Work: ?- none ordered ? ?If you have labs (blood work) drawn today and your tests are completely normal, you will receive your results only by: ?MyChart Message (if you have MyChart) OR ?A paper copy in the mail ?If you have any lab test that is abnormal or we need to change your treatment, we will call you to review the results. ? ? ?Testing/Procedures: ?- none ordered ? ? ?Follow-Up: ?At [facility], you and your health needs are our priority.  As part of our continuing mission to provide you with exceptional heart care, we have created designated Provider Care Teams.  These Care Teams include your primary Cardiologist (physician) and Advanced Practice Providers (APPs -  Physician Assistants and Nurse Practitioners) who all work together to provide you with the care you need, when you need it. ? ?We recommend signing up for the patient portal called "MyChart".  Sign up information is provided on this After Visit Summary.  MyChart is used to connect with patients for Virtual Visits (Telemedicine).  Patients are able to view lab/test results, encounter notes, upcoming appointments, etc.  Non-urgent messages can be sent to your provider as well.   ?To learn more about what you can do with MyChart, go to [email or web address].   ? ?Your next appointment:   ? 1) 2 weeks- with a PA/ NP ? ?2) 1 year with Dr. Quentin Ore ? ?The format for your next appointment:   ?In Person ? ?Provider:   ?As above  ? ? ?Other Instructions ?N/a ? ?

## 2022-01-28 ENCOUNTER — Other Ambulatory Visit: Payer: Self-pay

## 2022-01-28 ENCOUNTER — Encounter: Payer: Self-pay | Admitting: Family

## 2022-01-28 ENCOUNTER — Other Ambulatory Visit
Admission: RE | Admit: 2022-01-28 | Discharge: 2022-01-28 | Disposition: A | Discharge status: Home / Self Care | Payer: Medicare HMO | Source: Ambulatory Visit | Attending: Family | Admitting: Family

## 2022-01-28 ENCOUNTER — Ambulatory Visit (HOSPITAL_BASED_OUTPATIENT_CLINIC_OR_DEPARTMENT_OTHER): Payer: Medicare HMO | Admitting: Family

## 2022-01-28 ENCOUNTER — Telehealth: Payer: Self-pay | Admitting: Family

## 2022-01-28 VITALS — BP 138/81 | HR 50 | Resp 20 | Ht 68.0 in | Wt 228.2 lb

## 2022-01-28 DIAGNOSIS — E785 Hyperlipidemia, unspecified: Secondary | ICD-10-CM | POA: Insufficient documentation

## 2022-01-28 DIAGNOSIS — I130 Hypertensive heart and chronic kidney disease with heart failure and stage 1 through stage 4 chronic kidney disease, or unspecified chronic kidney disease: Secondary | ICD-10-CM | POA: Diagnosis not present

## 2022-01-28 DIAGNOSIS — E875 Hyperkalemia: Secondary | ICD-10-CM | POA: Insufficient documentation

## 2022-01-28 DIAGNOSIS — I10 Essential (primary) hypertension: Secondary | ICD-10-CM

## 2022-01-28 DIAGNOSIS — Z09 Encounter for follow-up examination after completed treatment for conditions other than malignant neoplasm: Secondary | ICD-10-CM | POA: Diagnosis not present

## 2022-01-28 DIAGNOSIS — I5022 Chronic systolic (congestive) heart failure: Secondary | ICD-10-CM

## 2022-01-28 DIAGNOSIS — Z794 Long term (current) use of insulin: Secondary | ICD-10-CM

## 2022-01-28 DIAGNOSIS — I890 Lymphedema, not elsewhere classified: Secondary | ICD-10-CM | POA: Diagnosis not present

## 2022-01-28 DIAGNOSIS — N1832 Chronic kidney disease, stage 3b: Secondary | ICD-10-CM

## 2022-01-28 DIAGNOSIS — R0602 Shortness of breath: Secondary | ICD-10-CM | POA: Insufficient documentation

## 2022-01-28 DIAGNOSIS — Z87891 Personal history of nicotine dependence: Secondary | ICD-10-CM | POA: Insufficient documentation

## 2022-01-28 DIAGNOSIS — I2510 Atherosclerotic heart disease of native coronary artery without angina pectoris: Secondary | ICD-10-CM

## 2022-01-28 DIAGNOSIS — N183 Chronic kidney disease, stage 3 unspecified: Secondary | ICD-10-CM | POA: Diagnosis not present

## 2022-01-28 DIAGNOSIS — E1122 Type 2 diabetes mellitus with diabetic chronic kidney disease: Secondary | ICD-10-CM | POA: Insufficient documentation

## 2022-01-28 DIAGNOSIS — Z950 Presence of cardiac pacemaker: Secondary | ICD-10-CM | POA: Insufficient documentation

## 2022-01-28 DIAGNOSIS — I272 Pulmonary hypertension, unspecified: Secondary | ICD-10-CM | POA: Insufficient documentation

## 2022-01-28 LAB — BASIC METABOLIC PANEL
Anion gap: 10 (ref 5–15)
BUN: 32 mg/dL — ABNORMAL HIGH (ref 8–23)
CO2: 28 mmol/L (ref 22–32)
Calcium: 8.4 mg/dL — ABNORMAL LOW (ref 8.9–10.3)
Chloride: 107 mmol/L (ref 98–111)
Creatinine, Ser: 1.99 mg/dL — ABNORMAL HIGH (ref 0.61–1.24)
GFR, Estimated: 32 mL/min — ABNORMAL LOW (ref >60)
Glucose, Bld: 100 mg/dL — ABNORMAL HIGH (ref 70–99)
Potassium: 3.2 mmol/L — ABNORMAL LOW (ref 3.5–5.1)
Sodium: 145 mmol/L (ref 135–145)

## 2022-01-28 MED ORDER — SACUBITRIL-VALSARTAN 24-26 MG PO TABS: 1.0000 | ORAL_TABLET | Freq: Two times a day (BID) | ORAL | 3 refills | Status: DC

## 2022-01-28 MED ORDER — POTASSIUM CHLORIDE CRYS ER 20 MEQ PO TBCR: 20.0000 meq | EXTENDED_RELEASE_TABLET | Freq: Every day | ORAL | 5 refills | Status: DC

## 2022-01-28 MED ORDER — LOSARTAN POTASSIUM 50 MG PO TABS: 50.0000 mg | ORAL_TABLET | Freq: Every day | ORAL | 3 refills | Status: DC

## 2022-01-28 NOTE — Telephone Encounter (Signed)
Received BMP results from earlier today to assess whether to switch his losartan to entresto with his CKD and history of hyperkalemia.  ? ?Potassium is 3.2 ?Creatinine 1.99 ?GFR 32 (worsening from 01/06/22 results) ? ?Advised him to NOT start the entresto as we had discussed earlier at his appointment but to continue his losartan instead. Also sending a RX for potassium 48mq daily for him to start taking that.  ? ?Sees cardiology 02/02/22 so will make them aware as he may want to recheck his labs.  ? ? ?

## 2022-01-28 NOTE — Progress Notes (Signed)
? Patient ID: [name], male    DOB: 1935-04-30, 87 y.o.   MRN: [id number] ? ?HPI ? ?Mr Gracia is a 87 y/o male with a history of heart block, CAD, DM, hyperlipidemia, HTN, pacemaker implantation, CKD, previous tobacco use and chronic heart failure.  ? ?Echo report from 01/05/22 reviewed and showed an EF of 30-35% along with mild LVH. Echo report from 08/27/21 reviewed and showed an EF of 35-40% along with mild LAE.  ? ?RHC/LHC done 08/31/21 & showed: ?Severe underlying three-vessel coronary artery disease with patent grafts including LIMA to LAD, SVG to large diagonal, sequential SVG to OM /distal left circumflex and SVG to right PDA. ?2.  Right heart catheterization showed normal right and left-sided filling pressures, minimal pulmonary hypertension and normal cardiac output. ? ?Admitted 01/04/22 due to SOB due to acute on chronic HF. Initially given IV lasix with transition to oral diuretics. PT evaluation done. Discharged after 2 days. Admitted 10/27/21 due to chest pain and shortness of breath. Initially given IV lasix with transition to oral diuretics. Cardiology consult obtained. Discharged after 3 days. Admitted 10/12/21 due to getting pacemaker implanted. Discharged the next day. Was in the ED 09/13/21 due to acute on chronic HF where he was evaluated and released.  ? ?He presents today for a follow-up visit with a chief complaint of minimal fatigue upon moderate exertion. He describes this as chronic in nature having been present for several years. He has associated shortness of breath and pedal edema along with this. He denies any difficulty sleeping, dizziness, cough, chest pain, palpitations, abdominal distention or weight gain.  ? ?Past Medical History:  ?Diagnosis Date  ? AV block, Mobitz 1   ? CAD (coronary artery disease)   ? a. 08/2019 VF arrest-->sev 3vd on cath-->CABGx5 (LIMA->LAD, VG->OM->LCX, VG->RPDA, VG->Diag); b. 08/2021 Cath: sev native multivessel dzs w/ 5/5 patent grafts. Nl filling  pressures->Med rx.  ? CKD (chronic kidney disease), stage III ([place])   ? Diabetes mellitus without complication ([place])   ? HFrEF (heart failure with reduced ejection fraction) ([place])   ? a. 08/2020 Echo: EF 40-45%; b. 11/2020 Echo: EF 55%, no rwma, mild LVH, Gr2 DD. nl RV fxn; c. 08/2021 Echo: EF 35-40%, glob HK w/ ant/antsept/apical HK. Reduced RV fxn. Mildly dil LA; d. 10/2021 s/p CRT-P.  ? Hyperlipidemia LDL goal <70   ? Hypertension   ? Intermittent Complete heart block ([place])   ? a. 08/2021 noted on Zio; b. 10/2021 s/p Abbott Allure RF CRT-P (Ser # [id number]).  ? Ischemic cardiomyopathy   ? a. 08/2020 Echo: EF 40-45%; b. 11/2020 Echo: EF 55%; c. 08/2021 Echo: EF 35-40%; d. 10/2021 s/p CRT-P.  ? Trifascicular block   ? ?Past Surgical History:  ?Procedure Laterality Date  ? BACK SURGERY    ? CARDIAC CATHETERIZATION    ? CLIPPING OF ATRIAL APPENDAGE N/A 08/29/2020  ? Procedure: CLIPPING OF ATRIAL APPENDAGE USING ATRICURE 77 MM ATRICLIP FLEX-V;  Surgeon: Grace Isaac, MD;  Location: [place];  Service: Open Heart Surgery;  Laterality: N/A;  ? CORONARY ARTERY BYPASS GRAFT N/A 08/29/2020  ? Procedure: CORONARY ARTERY BYPASS GRAFTING (CABG) X 5 USING LEFT INTERNAL MAMMARY ARTERY AND ENDOSCOPICALLY HARVESTED RIGHT GREATER SAPHENOUS VEIN. LIMA TO LAD, SVG TO OM SEQ TO CIRC, SVG TO PD, SVG TO DIAG.;  Surgeon: Grace Isaac, MD;  Location: [place];  Service: Open Heart Surgery;  Laterality: N/A;  ? ENDOVEIN HARVEST OF GREATER SAPHENOUS VEIN Right 08/29/2020  ? Procedure: ENDOVEIN  HARVEST OF GREATER SAPHENOUS VEIN;  Surgeon: Grace Isaac, MD;  Location: [facility];  Service: Open Heart Surgery;  Laterality: Right;  ? HERNIA REPAIR    ? LEFT HEART CATH AND CORONARY ANGIOGRAPHY N/A 08/25/2020  ? Procedure: LEFT HEART CATH AND CORONARY ANGIOGRAPHY;  Surgeon: [name], MD;  Location: [place] [facility];  Service: Cardiovascular;  Laterality: N/A;  ? PACEMAKER IMPLANT N/A 10/12/2021  ? Procedure: PACEMAKER IMPLANT;   Surgeon: Vickie Epley, MD;  Location: [place] [facility];  Service: Cardiovascular;  Laterality: N/A;  ? RIGHT/LEFT HEART CATH AND CORONARY ANGIOGRAPHY N/A 08/31/2021  ? Procedure: RIGHT/LEFT HEART CATH AND CORONARY ANGIOGRAPHY;  Surgeon: [name], MD;  Location: [place] [facility];  Service: Cardiovascular;  Laterality: N/A;  ? TEE WITHOUT CARDIOVERSION N/A 08/29/2020  ? Procedure: TRANSESOPHAGEAL ECHOCARDIOGRAM (TEE);  Surgeon: Grace Isaac, MD;  Location: [facility];  Service: Open Heart Surgery;  Laterality: N/A;  ? ? ?Family History  ?Problem Relation Age of Onset  ? Heart attack Mother   ? ?Social History  ? ?Tobacco Use  ? Smoking status: Former  ?  Types: Cigarettes  ?  Quit date: 07/26/1996  ?  Years since quitting: 25.5  ? Smokeless tobacco: Never  ?Substance Use Topics  ? Alcohol use: Not Currently  ? ?Allergies  ?Allergen Reactions  ? Angiotensin Receptor Blockers   ?  hyperkalemia  ? Metformin Diarrhea  ? Spironolactone   ?  Hyperkalemia ?  ? ?Prior to Admission medications   ?Medication Sig Start Date End Date Taking? Authorizing Provider  ?acetaminophen (TYLENOL) 500 MG tablet Take 500-1,000 mg by mouth every 6 (six) hours as needed for mild pain or moderate pain.    Yes [provider]  ?acyclovir (ZOVIRAX) 800 MG tablet Take 800 mg by mouth daily. 12/02/20  Yes [provider]  ?clopidogrel (PLAVIX) 75 MG tablet TAKE 1 TABLET BY MOUTH EVERY DAY 05/17/21  Yes Loel Dubonnet, NP  ?fluticasone (FLOVENT HFA) 44 MCG/ACT inhaler Inhale 2 puffs into the lungs in the morning and at bedtime.   Yes [provider]  ?furosemide (LASIX) 80 MG tablet Take 1 tablet (80 mg total) by mouth daily. 10/30/21 01/28/22 Yes Enzo Bi, MD  ?hydrALAZINE (APRESOLINE) 25 MG tablet Take 1 tablet (25 mg total) by mouth 3 (three) times daily. 10/30/21 01/28/22 Yes Enzo Bi, MD  ?insulin detemir (LEVEMIR) 100 UNIT/ML injection Inject 0.15 mLs (15 Units total) into the skin at bedtime.  10/30/21 01/28/22 Yes Enzo Bi, MD  ?magnesium oxide (MAG-OX) 400 MG tablet TAKE 1 TABLET BY MOUTH EVERY DAY 06/01/21  Yes [name], MD  ?Multiple Vitamin (MULTIVITAMIN WITH MINERALS) TABS tablet Take 1 tablet by mouth daily. 09/11/20  Yes Gold, Wayne E, PA-C  ?pantoprazole (PROTONIX) 40 MG tablet Take 40 mg by mouth 2 (two) times daily.   Yes [provider]  ?rosuvastatin (CRESTOR) 40 MG tablet Take 1 tablet (40 mg total) by mouth daily. 06/17/21  Yes [name], MD  ?albuterol (VENTOLIN HFA) 108 (90 Base) MCG/ACT inhaler Inhale 2 puffs into the lungs every 6 (six) hours as needed for wheezing or shortness of breath. ?Patient not taking: Reported on 11/25/2021 09/30/21   [provider]  ?carvedilol (COREG) 3.125 MG tablet Take 1 tablet (3.125 mg total) by mouth 2 (two) times daily with a meal. ?Patient not taking: Reported on 11/25/2021 11/12/21 02/10/22  [name], MD  ?isosorbide mononitrate (IMDUR) 30 MG  24 hr tablet Take 1 tablet (30 mg total) by mouth daily. 11/25/21 02/23/22  Alisa Graff, FNP  ?metolazone (ZAROXOLYN) 2.5 MG tablet Take 1 tablet (2.5) mg 1 hour before Lasix weekly on Tuesdays 11/25/21   Alisa Graff, FNP  ? ?Review of Systems  ?Constitutional:  Positive for fatigue. Negative for appetite change.  ?HENT:  Negative for congestion, postnasal drip and sore throat.   ?Eyes: Negative.   ?Respiratory:  Positive for shortness of breath. Negative for cough and chest tightness.   ?Cardiovascular:  Positive for leg swelling (improving). Negative for chest pain and palpitations.  ?Gastrointestinal:  Negative for abdominal distention and abdominal pain.  ?Endocrine: Negative.   ?Genitourinary: Negative.   ?Musculoskeletal:  Negative for back pain and neck pain.  ?Skin: Negative.   ?Allergic/Immunologic: Negative.   ?Neurological:  Negative for dizziness and light-headedness.  ?Hematological:  Negative for adenopathy. Does not bruise/bleed easily.   ?Psychiatric/Behavioral:  Negative for dysphoric mood and sleep disturbance (sleeping on 1 pillows). The patient is not nervous/anxious.   ? ?Vitals:  ? 01/28/22 1102  ?BP: 138/81  ?Pulse: (!) 50  ?Resp: 20  ?SpO2: 100%  ?Weigh

## 2022-01-28 NOTE — Patient Instructions (Addendum)
Continue weighing daily and call for an overnight weight gain of 3 pounds or more or a weekly weight gain of more than 5 pounds. ? ? ?If you have voicemail, please make sure your mailbox is cleaned out so that we may leave a message and please make sure to listen to any voicemails.  ? ? ?Do not take anymore losartan as we will begin entresto as 1 tablet twice a day if lab work today is ok ?

## 2022-02-02 ENCOUNTER — Ambulatory Visit (INDEPENDENT_AMBULATORY_CARE_PROVIDER_SITE_OTHER): Payer: Medicare HMO | Admitting: Nurse Practitioner

## 2022-02-02 ENCOUNTER — Other Ambulatory Visit: Payer: Self-pay

## 2022-02-02 ENCOUNTER — Encounter: Payer: Self-pay | Admitting: Nurse Practitioner

## 2022-02-02 VITALS — BP 120/70 | HR 67 | Ht 68.0 in | Wt 233.0 lb

## 2022-02-02 DIAGNOSIS — I255 Ischemic cardiomyopathy: Secondary | ICD-10-CM

## 2022-02-02 DIAGNOSIS — E785 Hyperlipidemia, unspecified: Secondary | ICD-10-CM

## 2022-02-02 DIAGNOSIS — I10 Essential (primary) hypertension: Secondary | ICD-10-CM | POA: Diagnosis not present

## 2022-02-02 DIAGNOSIS — I2510 Atherosclerotic heart disease of native coronary artery without angina pectoris: Secondary | ICD-10-CM | POA: Diagnosis not present

## 2022-02-02 DIAGNOSIS — I5022 Chronic systolic (congestive) heart failure: Secondary | ICD-10-CM | POA: Diagnosis not present

## 2022-02-02 DIAGNOSIS — N183 Chronic kidney disease, stage 3 unspecified: Secondary | ICD-10-CM

## 2022-02-02 DIAGNOSIS — I442 Atrioventricular block, complete: Secondary | ICD-10-CM

## 2022-02-02 DIAGNOSIS — E876 Hypokalemia: Secondary | ICD-10-CM

## 2022-02-02 NOTE — Patient Instructions (Signed)
Medication Instructions:  ?No changes at this time.  ? ?*If you need a refill on your cardiac medications before your next appointment, please call your pharmacy* ? ? ?Lab Work: ?BMET ? ?If you have labs (blood work) drawn today and your tests are completely normal, you will receive your results only by: ?MyChart Message (if you have MyChart) OR ?A paper copy in the mail ?If you have any lab test that is abnormal or we need to change your treatment, we will call you to review the results. ? ? ?Testing/Procedures: ?None ? ? ?Follow-Up: ?At [facility], you and your health needs are our priority.  As part of our continuing mission to provide you with exceptional heart care, we have created designated Provider Care Teams.  These Care Teams include your primary Cardiologist (physician) and Advanced Practice Providers (APPs -  Physician Assistants and Nurse Practitioners) who all work together to provide you with the care you need, when you need it. ? ?We recommend signing up for the patient portal called "MyChart".  Sign up information is provided on this After Visit Summary.  MyChart is used to connect with patients for Virtual Visits (Telemedicine).  Patients are able to view lab/test results, encounter notes, upcoming appointments, etc.  Non-urgent messages can be sent to your provider as well.   ?To learn more about what you can do with MyChart, go to [email or web address].   ? ?Your next appointment:   ?2 month(s) ? ?The format for your next appointment:   ?In Person ? ?Provider:   ?[name], MD or Murray Hodgkins, NP ?
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
## 2022-02-03 ENCOUNTER — Telehealth: Payer: Self-pay | Admitting: *Deleted

## 2022-02-03 LAB — BASIC METABOLIC PANEL
BUN/Creatinine Ratio: 11 (ref 10–24)
BUN: 22 mg/dL (ref 8–27)
CO2: 23 mmol/L (ref 20–29)
Calcium: 8.6 mg/dL (ref 8.6–10.2)
Chloride: 109 mmol/L — ABNORMAL HIGH (ref 96–106)
Creatinine, Ser: 2.01 mg/dL — ABNORMAL HIGH (ref 0.76–1.27)
Glucose: 145 mg/dL — ABNORMAL HIGH (ref 70–99)
Potassium: 4.1 mmol/L (ref 3.5–5.2)
Sodium: 146 mmol/L — ABNORMAL HIGH (ref 134–144)
eGFR: 32 mL/min/{1.73_m2} — ABNORMAL LOW (ref >59)

## 2022-02-03 NOTE — Telephone Encounter (Signed)
Left voicemail message to call back for review of results and recommendations.  ? ? ?Theora Gianotti, NP  Valora Corporal, RN ?Sorry - contact us if wt trending over 230 lbs on his home scale.   ?Previous Messages ?  ?----- Message -----  ?From: Valora Corporal, RN  ?Sent: 02/03/2022  12:46 PM EDT  ?To: Theora Gianotti, NP  ? ?Clarification on weight. Last weight here in office was 233 pounds  ?----- Message -----  ?From: Theora Gianotti, NP  ?Sent: 02/03/2022  12:30 PM EDT  ?To: Valora Corporal, RN  ? ?Potassium improved and now normal at 4.1.  Kidney function is relatively stable compared to a week ago.  Continue current doses of torsemide and metolazone with close monitoring of home weights.  Patient should have a low threshold to contact us for weight gain greater than 2 pounds in 24 hours or at any point if weights begin trending over 130 pounds on his home scale.  ?

## 2022-02-03 NOTE — Telephone Encounter (Signed)
-----   Message from Theora Gianotti, NP sent at 02/03/2022 12:30 PM EDT ----- ?Potassium improved and now normal at 4.1.  Kidney function is relatively stable compared to a week ago.  Continue current doses of torsemide and metolazone with close monitoring of home weights.  Patient should have a low threshold to contact us for weight gain greater than 2 pounds in 24 hours or at any point if weights begin trending over 130 pounds on his home scale. ?

## 2022-02-04 NOTE — Telephone Encounter (Signed)
Patient returning call.

## 2022-02-05 NOTE — Telephone Encounter (Signed)
Reviewed results and recommendations with patient and he verbalized understanding with no further questions at this time.  

## 2022-02-12 ENCOUNTER — Other Ambulatory Visit: Payer: Self-pay | Admitting: Cardiology
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
## 2022-02-23 ENCOUNTER — Encounter: Payer: Self-pay | Admitting: Family

## 2022-02-23 ENCOUNTER — Ambulatory Visit: Payer: Medicare HMO | Attending: Family | Admitting: Family

## 2022-02-23 VITALS — BP 135/85 | HR 63 | Resp 14 | Ht 68.0 in | Wt 233.5 lb

## 2022-02-23 DIAGNOSIS — E1122 Type 2 diabetes mellitus with diabetic chronic kidney disease: Secondary | ICD-10-CM | POA: Diagnosis not present

## 2022-02-23 DIAGNOSIS — I10 Essential (primary) hypertension: Secondary | ICD-10-CM

## 2022-02-23 DIAGNOSIS — I5022 Chronic systolic (congestive) heart failure: Secondary | ICD-10-CM

## 2022-02-23 DIAGNOSIS — I2510 Atherosclerotic heart disease of native coronary artery without angina pectoris: Secondary | ICD-10-CM | POA: Diagnosis not present

## 2022-02-23 DIAGNOSIS — N183 Chronic kidney disease, stage 3 unspecified: Secondary | ICD-10-CM | POA: Diagnosis not present

## 2022-02-23 DIAGNOSIS — Z950 Presence of cardiac pacemaker: Secondary | ICD-10-CM | POA: Insufficient documentation

## 2022-02-23 DIAGNOSIS — N1832 Chronic kidney disease, stage 3b: Secondary | ICD-10-CM

## 2022-02-23 DIAGNOSIS — Z87891 Personal history of nicotine dependence: Secondary | ICD-10-CM | POA: Insufficient documentation

## 2022-02-23 DIAGNOSIS — Z79899 Other long term (current) drug therapy: Secondary | ICD-10-CM | POA: Insufficient documentation

## 2022-02-23 DIAGNOSIS — E875 Hyperkalemia: Secondary | ICD-10-CM | POA: Insufficient documentation

## 2022-02-23 DIAGNOSIS — I130 Hypertensive heart and chronic kidney disease with heart failure and stage 1 through stage 4 chronic kidney disease, or unspecified chronic kidney disease: Secondary | ICD-10-CM | POA: Diagnosis not present

## 2022-02-23 DIAGNOSIS — Z794 Long term (current) use of insulin: Secondary | ICD-10-CM

## 2022-02-23 DIAGNOSIS — I890 Lymphedema, not elsewhere classified: Secondary | ICD-10-CM | POA: Diagnosis not present

## 2022-02-23 MED ORDER — METOLAZONE 2.5 MG PO TABS
ORAL_TABLET | ORAL | 5 refills | Status: DC
Start: 2022-02-23 — End: 2022-04-01

## 2022-02-23 NOTE — Patient Instructions (Addendum)
Continue weighing daily and call for an overnight weight gain of 3 pounds or more or a weekly weight gain of more than 5 pounds.   If you have voicemail, please make sure your mailbox is cleaned out so that we may leave a message and please make sure to listen to any voicemails.    Start wearing compression socks daily with removal at bedtime 

## 2022-02-23 NOTE — Progress Notes (Deleted)
? Patient ID: [name], male    DOB: [birth year], 87 y.o.   MRN: [id number] ? ?HPI ? ?[name] is a 88 y/o male with a history of heart block, CAD, DM, hyperlipidemia, HTN, pacemaker implantation, CKD, previous tobacco use and chronic heart failure.  ? ?Echo report from 01/05/22 reviewed and showed an EF of 30-35% along with mild LVH. Echo report from 08/27/21 reviewed and showed an EF of 35-40% along with mild LAE.  ? ?RHC/LHC done 08/31/21 & showed: ?Severe underlying three-vessel coronary artery disease with patent grafts including LIMA to LAD, SVG to large diagonal, sequential SVG to OM /distal left circumflex and SVG to right PDA. ?2.  Right heart catheterization showed normal right and left-sided filling pressures, minimal pulmonary hypertension and normal cardiac output. ? ?Admitted 01/04/22 due to SOB due to acute on chronic HF. Initially given IV lasix with transition to oral diuretics. PT evaluation done. Discharged after 2 days. Admitted 10/27/21 due to chest pain and shortness of breath. Initially given IV lasix with transition to oral diuretics. Cardiology consult obtained. Discharged after 3 days. Admitted 10/12/21 due to getting pacemaker implanted. Discharged the next day. Was in the ED 09/13/21 due to acute on chronic HF where he was evaluated and released.  ? ?He presents today for a follow-up visit with a chief complaint of minimal fatigue upon moderate exertion. He describes this as chronic in nature having been present for several years. He has associated shortness of breath and pedal edema along with this. He denies any difficulty sleeping, dizziness, cough, chest pain, palpitations, abdominal distention or weight gain.  ? ?Past Medical History:  ?Diagnosis Date  ? AV block, Mobitz 1   ? CAD (coronary artery disease)   ? a. 08/2019 VF arrest-->sev 3vd on cath-->CABGx5 (LIMA->LAD, VG->OM->LCX, VG->RPDA, VG->Diag); b. 08/2021 Cath: sev native multivessel dzs w/ 5/5 patent grafts. Nl filling  pressures->Med rx.  ? CKD (chronic kidney disease), stage III ([place])   ? Diabetes mellitus without complication ([place])   ? HFrEF (heart failure with reduced ejection fraction) ([place])   ? a. 08/2020 Echo: EF 40-45%; b. 11/2020 Echo: EF 55%, no rwma, mild LVH, Gr2 DD. nl RV fxn; c. 08/2021 Echo: EF 35-40%, glob HK w/ ant/antsept/apical HK. Reduced RV fxn. Mildly dil LA; d. 10/2021 s/p CRT-P; e. 12/2021 Echo: EF 30-35%, glob HK, GrIII DD. Mod red RV fxn, mild LAE, mild [place], AoV sclerosis.  ? Hyperlipidemia LDL goal <70   ? Hypertension   ? Intermittent Complete heart block ([place])   ? a. 08/2021 noted on Zio; b. 10/2021 s/p Abbott Allure RF CRT-P (Ser # [id number]).  ? Ischemic cardiomyopathy   ? a. 08/2020 Echo: EF 40-45%; b. 11/2020 Echo: EF 55%; c. 08/2021 Echo: EF 35-40%; d. 10/2021 s/p CRT-P; e. 12/2021 Echo: EF 30-35%, glob HK, GrIII DD.  ? Trifascicular block   ? ?Past Surgical History:  ?Procedure Laterality Date  ? BACK SURGERY    ? CARDIAC CATHETERIZATION    ? CLIPPING OF ATRIAL APPENDAGE N/A 08/29/2020  ? Procedure: CLIPPING OF ATRIAL APPENDAGE USING ATRICURE 24 MM ATRICLIP FLEX-V;  Surgeon: Grace Isaac, MD;  Location: [facility];  Service: Open Heart Surgery;  Laterality: N/A;  ? CORONARY ARTERY BYPASS GRAFT N/A 08/29/2020  ? Procedure: CORONARY ARTERY BYPASS GRAFTING (CABG) X 5 USING LEFT INTERNAL MAMMARY ARTERY AND ENDOSCOPICALLY HARVESTED RIGHT GREATER SAPHENOUS VEIN. LIMA TO LAD, SVG TO OM SEQ TO CIRC, SVG TO PD, SVG TO DIAG.;  Surgeon: Lanelle Bal  B, MD;  Location: [facility];  Service: Open Heart Surgery;  Laterality: N/A;  ? ENDOVEIN HARVEST OF GREATER SAPHENOUS VEIN Right 08/29/2020  ? Procedure: ENDOVEIN HARVEST OF GREATER SAPHENOUS VEIN;  Surgeon: Grace Isaac, MD;  Location: [facility];  Service: Open Heart Surgery;  Laterality: Right;  ? HERNIA REPAIR    ? LEFT HEART CATH AND CORONARY ANGIOGRAPHY N/A 08/25/2020  ? Procedure: LEFT HEART CATH AND CORONARY ANGIOGRAPHY;  Surgeon: [name], MD;   Location: [facility];  Service: Cardiovascular;  Laterality: N/A;  ? PACEMAKER IMPLANT N/A 10/12/2021  ? Procedure: PACEMAKER IMPLANT;  Surgeon: Vickie Epley, MD;  Location: [facility];  Service: Cardiovascular;  Laterality: N/A;  ? RIGHT/LEFT HEART CATH AND CORONARY ANGIOGRAPHY N/A 08/31/2021  ? Procedure: RIGHT/LEFT HEART CATH AND CORONARY ANGIOGRAPHY;  Surgeon: [name], MD;  Location: [facility];  Service: Cardiovascular;  Laterality: N/A;  ? TEE WITHOUT CARDIOVERSION N/A 08/29/2020  ? Procedure: TRANSESOPHAGEAL ECHOCARDIOGRAM (TEE);  Surgeon: Grace Isaac, MD;  Location: [facility];  Service: Open Heart Surgery;  Laterality: N/A;  ? ? ?Family History  ?Problem Relation Age of Onset  ? Heart attack Mother   ? ?Social History  ? ?Tobacco Use  ? Smoking status: Former  ?  Types: Cigarettes  ?  Quit date: 07/26/1996  ?  Years since quitting: 25.5  ? Smokeless tobacco: Never  ?Substance Use Topics  ? Alcohol use: Not Currently  ? ?Allergies  ?Allergen Reactions  ? Angiotensin Receptor Blockers   ?  hyperkalemia  ? Metformin Diarrhea  ? Spironolactone   ?  Hyperkalemia ?  ? ? ?Review of Systems  ?Constitutional:  Positive for fatigue (easily). Negative for appetite change.  ?Eyes: Negative.   ?Respiratory:  Positive for shortness of breath. Negative for cough.   ?Cardiovascular:  Positive for leg swelling. Negative for chest pain and palpitations.  ?Gastrointestinal:  Negative for abdominal distention and abdominal pain.  ?Endocrine: Negative.   ?Genitourinary: Negative.   ?Musculoskeletal:  Negative for back pain.  ?Skin: Negative.   ?Allergic/Immunologic: Negative.   ?Neurological:  Negative for dizziness and light-headedness.  ?Hematological:  Negative for adenopathy. Does not bruise/bleed easily.  ?Psychiatric/Behavioral:  Negative for dysphoric mood and sleep disturbance (sleeping on 1 pillows). The patient is not nervous/anxious.   ? ?Vitals:  ? 02/23/22 1117  ?BP:  135/85  ?Pulse: 63  ?Resp: 14  ?SpO2: 99%  ?Weight: 233 lb 8 oz (105.9 kg)  ?Height: '5\' 8"'$  (1.727 m)  ? ?Wt Readings from Last 3 Encounters:  ?02/23/22 233 lb 8 oz (105.9 kg)  ?02/02/22 233 lb (105.7 kg)  ?01/28/22 228 lb 4 oz (103.5 kg)  ? ?Lab Results  ?Component Value Date  ? CREATININE 2.01 (H) 02/02/2022  ? CREATININE 1.99 (H) 01/28/2022  ? CREATININE 1.62 (H) 01/06/2022  ? ?Physical Exam ?Vitals and nursing note reviewed.  ?Constitutional:   ?   Appearance: He is well-developed.  ?   Interventions: He is not intubated. ?HENT:  ?   Head: Normocephalic and atraumatic.  ?Neck:  ?   Vascular: No JVD.  ?Cardiovascular:  ?   Rate and Rhythm: Bradycardia present. Rhythm irregular.  ?Pulmonary:  ?   Effort: Pulmonary effort is normal. No tachypnea or accessory muscle usage. He is not intubated.  ?Abdominal:  ?   Palpations: Abdomen is soft.  ?   Tenderness: There is no abdominal tenderness.  ?Musculoskeletal:  ?   Cervical back:  Neck supple.  ?   Right lower leg: No tenderness. Edema (trace pitting) present.  ?   Left lower leg: No tenderness. Edema (trace pitting) present.  ?Skin: ?   General: Skin is warm and dry.  ?Neurological:  ?   General: No focal deficit present.  ?   Mental Status: He is alert and oriented to person, place, and time.  ?Psychiatric:     ?   Mood and Affect: Mood normal.     ?   Behavior: Behavior normal.  ? ? ?Assessment & Plan: ? ?1: Chronic heart failure with reduced ejection fraction- ?- NYHA class II ?- euvolemic today ?- weighing daily & weight ranges from 223-225 pounds; reminded to call for an overnight weight gain of >2 pounds or a weekly weight gain of > 5 pounds ?- weight down 2 pounds (228.4) from last visit here 2 months ago ?- not adding salt and he says that his wife doesn't cook with salt either ?- had pacemaker implanted 10/12/21 ?- had hyperkalemia with spironolactone ?- on GDMT of carvedilol & losartan ?- discussed trying entresto if renal function is stable; he says that he  tried this once before and it had to be stopped but he can't remember why ?- will check BMP today and begin entresto 24/'26mg'$  BID if able; coupon given and RX sent but patient is not to pick it up until he hears from

## 2022-02-23 NOTE — Progress Notes (Signed)
? Patient ID: [name], male    DOB: 1935/01/03, 87 y.o.   MRN: [id number] ? ?HPI ? ?Mr Delpizzo is a 87 y/o male with a history of heart block, CAD, DM, hyperlipidemia, HTN, pacemaker implantation, CKD, previous tobacco use and chronic heart failure.  ? ?Echo report from 01/05/22 reviewed and showed an EF of 30-35% along with mild LVH. Echo report from 08/27/21 reviewed and showed an EF of 35-40% along with mild LAE.  ? ?RHC/LHC done 08/31/21 & showed: ?Severe underlying three-vessel coronary artery disease with patent grafts including LIMA to LAD, SVG to large diagonal, sequential SVG to OM /distal left circumflex and SVG to right PDA. ?2.  Right heart catheterization showed normal right and left-sided filling pressures, minimal pulmonary hypertension and normal cardiac output. ? ?Admitted 01/04/22 due to SOB due to acute on chronic HF. Initially given IV lasix with transition to oral diuretics. PT evaluation done. Discharged after 2 days. Admitted 10/27/21 due to chest pain and shortness of breath. Initially given IV lasix with transition to oral diuretics. Cardiology consult obtained. Discharged after 3 days. Admitted 10/12/21 due to getting pacemaker implanted. Discharged the next day. Was in the ED 09/13/21 due to acute on chronic HF where he was evaluated and released.  ? ?He presents today for a follow-up visit with a chief complaint of minimal shortness of breath upon moderate exertion. Describes this as chronic in nature having been present for several years. He has associated pedal edema and fatigue along with this. He denies any dizziness, difficulty sleeping, abdominal distention, palpitations, chest pain, cough or weight gain.  ? ?Took his last dose of weekly metolazone last week and needs a new RX for this.  ? ?Has worn compression socks in the past but not recently.  ? ?Past Medical History:  ?Diagnosis Date  ? AV block, Mobitz 1   ? CAD (coronary artery disease)   ? a. 08/2019 VF arrest-->sev 3vd on  cath-->CABGx5 (LIMA->LAD, VG->OM->LCX, VG->RPDA, VG->Diag); b. 08/2021 Cath: sev native multivessel dzs w/ 5/5 patent grafts. Nl filling pressures->Med rx.  ? CKD (chronic kidney disease), stage III ([place])   ? Diabetes mellitus without complication ([place])   ? HFrEF (heart failure with reduced ejection fraction) ([place])   ? a. 08/2020 Echo: EF 40-45%; b. 11/2020 Echo: EF 55%, no rwma, mild LVH, Gr2 DD. nl RV fxn; c. 08/2021 Echo: EF 35-40%, glob HK w/ ant/antsept/apical HK. Reduced RV fxn. Mildly dil LA; d. 10/2021 s/p CRT-P; e. 12/2021 Echo: EF 30-35%, glob HK, GrIII DD. Mod red RV fxn, mild LAE, mild MR, AoV sclerosis.  ? Hyperlipidemia LDL goal <70   ? Hypertension   ? Intermittent Complete heart block ([place])   ? a. 08/2021 noted on Zio; b. 10/2021 s/p Abbott Allure RF CRT-P (Ser # [id number]).  ? Ischemic cardiomyopathy   ? a. 08/2020 Echo: EF 40-45%; b. 11/2020 Echo: EF 55%; c. 08/2021 Echo: EF 35-40%; d. 10/2021 s/p CRT-P; e. 12/2021 Echo: EF 30-35%, glob HK, GrIII DD.  ? Trifascicular block   ? ?Past Surgical History:  ?Procedure Laterality Date  ? BACK SURGERY    ? CARDIAC CATHETERIZATION    ? CLIPPING OF ATRIAL APPENDAGE N/A 08/29/2020  ? Procedure: CLIPPING OF ATRIAL APPENDAGE USING ATRICURE 89 MM ATRICLIP FLEX-V;  Surgeon: Grace Isaac, MD;  Location: [facility];  Service: Open Heart Surgery;  Laterality: N/A;  ? CORONARY ARTERY BYPASS GRAFT N/A 08/29/2020  ? Procedure: CORONARY ARTERY BYPASS GRAFTING (CABG) X 5 USING LEFT  INTERNAL MAMMARY ARTERY AND ENDOSCOPICALLY HARVESTED RIGHT GREATER SAPHENOUS VEIN. LIMA TO LAD, SVG TO OM SEQ TO CIRC, SVG TO PD, SVG TO DIAG.;  Surgeon: Grace Isaac, MD;  Location: [facility];  Service: Open Heart Surgery;  Laterality: N/A;  ? ENDOVEIN HARVEST OF GREATER SAPHENOUS VEIN Right 08/29/2020  ? Procedure: ENDOVEIN HARVEST OF GREATER SAPHENOUS VEIN;  Surgeon: Grace Isaac, MD;  Location: [facility];  Service: Open Heart Surgery;  Laterality: Right;  ? HERNIA REPAIR    ? LEFT HEART  CATH AND CORONARY ANGIOGRAPHY N/A 08/25/2020  ? Procedure: LEFT HEART CATH AND CORONARY ANGIOGRAPHY;  Surgeon: [name], MD;  Location: [facility];  Service: Cardiovascular;  Laterality: N/A;  ? PACEMAKER IMPLANT N/A 10/12/2021  ? Procedure: PACEMAKER IMPLANT;  Surgeon: Vickie Epley, MD;  Location: [facility];  Service: Cardiovascular;  Laterality: N/A;  ? RIGHT/LEFT HEART CATH AND CORONARY ANGIOGRAPHY N/A 08/31/2021  ? Procedure: RIGHT/LEFT HEART CATH AND CORONARY ANGIOGRAPHY;  Surgeon: [name], MD;  Location: [facility];  Service: Cardiovascular;  Laterality: N/A;  ? TEE WITHOUT CARDIOVERSION N/A 08/29/2020  ? Procedure: TRANSESOPHAGEAL ECHOCARDIOGRAM (TEE);  Surgeon: Grace Isaac, MD;  Location: [facility];  Service: Open Heart Surgery;  Laterality: N/A;  ? ? ?Family History  ?Problem Relation Age of Onset  ? Heart attack Mother   ? ?Social History  ? ?Tobacco Use  ? Smoking status: Former  ?  Types: Cigarettes  ?  Quit date: 07/26/1996  ?  Years since quitting: 25.5  ? Smokeless tobacco: Never  ?Substance Use Topics  ? Alcohol use: Not Currently  ? ?Allergies  ?Allergen Reactions  ? Angiotensin Receptor Blockers   ?  hyperkalemia  ? Metformin Diarrhea  ? Spironolactone   ?  Hyperkalemia ?  ? ?Prior to Admission medications   ?Medication Sig Start Date End Date Taking? Authorizing Provider  ?acetaminophen (TYLENOL) 500 MG tablet Take 500-1,000 mg by mouth every 6 (six) hours as needed for mild pain or moderate pain.    Yes [provider]  ?acyclovir (ZOVIRAX) 800 MG tablet Take 800 mg by mouth daily. 12/02/20  Yes [provider]  ?albuterol (VENTOLIN HFA) 108 (90 Base) MCG/ACT inhaler Inhale 2 puffs into the lungs every 6 (six) hours as needed for wheezing or shortness of breath. 09/30/21  Yes [provider]  ?carvedilol (COREG) 3.125 MG tablet Take 1 tablet (3.125 mg total) by mouth 2 (two) times daily with a meal. 11/12/21  Yes  [name], MD  ?clopidogrel (PLAVIX) 75 MG tablet TAKE 1 TABLET BY MOUTH EVERY DAY 05/17/21  Yes Loel Dubonnet, NP  ?fluticasone (FLOVENT HFA) 110 MCG/ACT inhaler Inhale into the lungs as needed.   Yes [provider]  ?furosemide (LASIX) 80 MG tablet Take 1 tablet (80 mg total) by mouth daily. 10/30/21  Yes Enzo Bi, MD  ?hydrALAZINE (APRESOLINE) 25 MG tablet Take 25 mg by mouth 2 times daily at 12 noon and 4 pm.   Yes [provider]  ?insulin detemir (LEVEMIR) 100 UNIT/ML injection Inject 0.15 mLs (15 Units total) into the skin at bedtime. 10/30/21  Yes Enzo Bi, MD  ?isosorbide mononitrate (IMDUR) 30 MG 24 hr tablet Take 1 tablet (30 mg total) by mouth daily. 11/25/21 02/23/22 Yes Alisa Graff, FNP  ?losartan (COZAAR) 25 MG tablet Take 25 mg by mouth daily.   Yes [provider]  ?magnesium oxide (MAG-OX) 400 MG tablet  Take 1 tablet (400 mg total) by mouth daily. 01/20/22  Yes Vickie Epley, MD  ?Multiple Vitamin (MULTIVITAMIN WITH MINERALS) TABS tablet Take 1 tablet by mouth daily. 09/11/20  Yes Gold, Wayne E, PA-C  ?pantoprazole (PROTONIX) 40 MG tablet Take 40 mg by mouth 2 (two) times daily.   Yes [provider]  ?potassium chloride SA (KLOR-CON M) 20 MEQ tablet Take 1 tablet (20 mEq total) by mouth daily. 01/28/22  Yes Alisa Graff, FNP  ?rosuvastatin (CRESTOR) 40 MG tablet TAKE 1 TABLET BY MOUTH EVERY DAY 12/14/21  Yes [name], MD  ?metolazone (ZAROXOLYN) 2.5 MG tablet Take 1 tablet (2.5) mg 1 hour before Lasix weekly on Tuesdays 02/23/22   Alisa Graff, FNP  ? ? ?Review of Systems  ?Constitutional:  Positive for fatigue. Negative for appetite change.  ?HENT:  Negative for congestion, postnasal drip and sneezing.   ?Eyes: Negative.   ?Respiratory:  Positive for shortness of breath. Negative for cough and chest tightness.   ?Cardiovascular:  Positive for leg swelling. Negative for chest pain and palpitations.  ?Gastrointestinal:  Negative for  abdominal distention and abdominal pain.  ?Endocrine: Negative.   ?Genitourinary: Negative.   ?Musculoskeletal:  Negative for back pain and neck pain.  ?Skin: Negative.   ?Allergic/Immunologic: Negative.   ?Ne

## 2022-03-24 ENCOUNTER — Emergency Department: Payer: Medicare HMO

## 2022-03-24 ENCOUNTER — Emergency Department
Admission: EM | Admit: 2022-03-24 | Discharge: 2022-03-24 | Disposition: A | Discharge status: Home / Self Care | Payer: Medicare HMO | Attending: Emergency Medicine | Admitting: Emergency Medicine

## 2022-03-24 DIAGNOSIS — I2510 Atherosclerotic heart disease of native coronary artery without angina pectoris: Secondary | ICD-10-CM | POA: Diagnosis not present

## 2022-03-24 DIAGNOSIS — R7989 Other specified abnormal findings of blood chemistry: Secondary | ICD-10-CM | POA: Insufficient documentation

## 2022-03-24 DIAGNOSIS — N183 Chronic kidney disease, stage 3 unspecified: Secondary | ICD-10-CM | POA: Insufficient documentation

## 2022-03-24 DIAGNOSIS — R0602 Shortness of breath: Secondary | ICD-10-CM | POA: Diagnosis present

## 2022-03-24 DIAGNOSIS — R0601 Orthopnea: Secondary | ICD-10-CM

## 2022-03-24 DIAGNOSIS — I5043 Acute on chronic combined systolic (congestive) and diastolic (congestive) heart failure: Secondary | ICD-10-CM | POA: Insufficient documentation

## 2022-03-24 DIAGNOSIS — Z951 Presence of aortocoronary bypass graft: Secondary | ICD-10-CM | POA: Insufficient documentation

## 2022-03-24 LAB — CBC
HCT: 45.5 % (ref 39.0–52.0)
Hemoglobin: 13.8 g/dL (ref 13.0–17.0)
MCH: 25.5 pg — ABNORMAL LOW (ref 26.0–34.0)
MCHC: 30.3 g/dL (ref 30.0–36.0)
MCV: 84.1 fL (ref 80.0–100.0)
Platelets: 185 10*3/uL (ref 150–400)
RBC: 5.41 MIL/uL (ref 4.22–5.81)
RDW: 17.5 % — ABNORMAL HIGH (ref 11.5–15.5)
WBC: 6.7 10*3/uL (ref 4.0–10.5)
nRBC: 0 % (ref 0.0–0.2)

## 2022-03-24 LAB — BASIC METABOLIC PANEL
Anion gap: 8 (ref 5–15)
BUN: 31 mg/dL — ABNORMAL HIGH (ref 8–23)
CO2: 24 mmol/L (ref 22–32)
Calcium: 8.3 mg/dL — ABNORMAL LOW (ref 8.9–10.3)
Chloride: 113 mmol/L — ABNORMAL HIGH (ref 98–111)
Creatinine, Ser: 1.87 mg/dL — ABNORMAL HIGH (ref 0.61–1.24)
GFR, Estimated: 35 mL/min — ABNORMAL LOW (ref >60)
Glucose, Bld: 146 mg/dL — ABNORMAL HIGH (ref 70–99)
Potassium: 4.7 mmol/L (ref 3.5–5.1)
Sodium: 145 mmol/L (ref 135–145)

## 2022-03-24 LAB — PROTIME-INR
INR: 1.1 (ref 0.8–1.2)
Prothrombin Time: 14 seconds (ref 11.4–15.2)

## 2022-03-24 LAB — BRAIN NATRIURETIC PEPTIDE: B Natriuretic Peptide: 2903.5 pg/mL — ABNORMAL HIGH (ref 0.0–100.0)

## 2022-03-24 LAB — TROPONIN I (HIGH SENSITIVITY)
Troponin I (High Sensitivity): 24 ng/L — ABNORMAL HIGH (ref <18)
Troponin I (High Sensitivity): 25 ng/L — ABNORMAL HIGH (ref <18)

## 2022-03-24 LAB — PROCALCITONIN: Procalcitonin: <0.1 ng/mL

## 2022-03-24 MED ORDER — FUROSEMIDE 10 MG/ML IJ SOLN
60.0000 mg | Freq: Once | INTRAMUSCULAR | Status: AC
  Administered 2022-03-24: 60 mg via INTRAVENOUS
  Filled 2022-03-24: qty 8

## 2022-03-24 NOTE — ED Triage Notes (Signed)
Pt comes into the ED via EMS from home with c/o chest pain since last night, non radiating central chest pain, with SOB, hx of CHF ? ?96%RA ?PZ96 ?HR80, paced ?172/118 ?Nitro SL x1 ?ASA 324 ?#20gLAC ?[id number] ?

## 2022-03-24 NOTE — ED Triage Notes (Signed)
Pt states cp for about a week but got worse last night. See first nurse note.  ?

## 2022-03-24 NOTE — Discharge Instructions (Signed)
We gave you 60 mg of your fluid pill (Lasix) and the IV with significant improvement of your breathing.   ? ?Please follow-up with the CHF clinic by the end of this week.  They should call you tomorrow to set up an appointment for the end of this week or early next week. ? ?Continue all of your medications at home, including your fluid pill Lasix. ?Tomorrow, please take an extra half tablet of your Lasix, after that return to your normal schedule ? ?If you develop any further worsening symptoms despite this, please return to the ED ?

## 2022-03-24 NOTE — ED Provider Notes (Signed)
[facility] Provider Note    Event Date/Time   First MD Initiated Contact with Patient 03/24/22 1714     (approximate)   History   Chest Pain   HPI  [name] is a 87 y.o. male who presents to the ED for evaluation of Chest Pain   I reviewed medical DC summary from 3/1 where he was admitted for acute on chronic systolic CHF.  EF of 35-40%.  CAD s/p CABG, history of cardiac arrest, CKD 3 and metabolic syndrome.  Patient presents to the ED for evaluation of worsening shortness of breath, orthopnea and cough.  He is triaged as chest pain, but he denies any pain to me and says it is just his breathing.  Reports that is been worsening for the past few days.  Reports cough of thin frothy sputum without thick production, fever or chest pain.  Reports orthopnea and increased lower extremity swelling as well as weighing a pound more today than yesterday.  Does not know his dry weight or any weights beyond yesterday.  Physical Exam   Triage Vital Signs: ED Triage Vitals  Enc Vitals Group     BP 03/24/22 1418 (!) 148/87     Pulse Rate 03/24/22 1418 (!) 56     Resp 03/24/22 1418 18     Temp 03/24/22 1418 98.4 F (36.9 C)     Temp Source 03/24/22 1418 Oral     SpO2 03/24/22 1418 95 %     Weight 03/24/22 1416 232 lb (105.2 kg)     Height --      Head Circumference --      Peak Flow --      Pain Score 03/24/22 1416 0     Pain Loc --      Pain Edu? --      Excl. in [place]? --     Most recent vital signs: Vitals:   03/24/22 2000 03/24/22 2100  BP: (!) 166/103 (!) 171/98  Pulse: 72 79  Resp: 17 (!) 22  Temp:  98.6 F (37 C)  SpO2: 96% 96%    General: Awake, no distress.  Obese.  Sitting up in bed, speaking in full sentences on room air. CV:  Good peripheral perfusion. RRR Resp:  Normal effort.  No wheezing.  Faint bibasilar crackles. Abd:  No distention.  MSK:  No deformity noted.  Pitting edema to bilateral lower extremities symmetrically without  overlying skin changes or signs of trauma. Neuro:  No focal deficits appreciated. Other:     ED Results / Procedures / Treatments   Labs (all labs ordered are listed, but only abnormal results are displayed) Labs Reviewed  BASIC METABOLIC PANEL - Abnormal; Notable for the following components:      Result Value   Chloride 113 (*)    Glucose, Bld 146 (*)    BUN 31 (*)    Creatinine, Ser 1.87 (*)    Calcium 8.3 (*)    GFR, Estimated 35 (*)    All other components within normal limits  CBC - Abnormal; Notable for the following components:   MCH 25.5 (*)    RDW 17.5 (*)    All other components within normal limits  BRAIN NATRIURETIC PEPTIDE - Abnormal; Notable for the following components:   B Natriuretic Peptide 2,903.5 (*)    All other components within normal limits  TROPONIN I (HIGH SENSITIVITY) - Abnormal; Notable for the following components:   Troponin I (High Sensitivity) 24 (*)  All other components within normal limits  TROPONIN I (HIGH SENSITIVITY) - Abnormal; Notable for the following components:   Troponin I (High Sensitivity) 25 (*)    All other components within normal limits  PROTIME-INR  PROCALCITONIN  PROCALCITONIN    EKG Ventricularly paced rhythm with a rate of 71 bpm.  Normal axis and appropriate intervals with LBBB morphology and no STEMI by Sgarbossa criteria  RADIOLOGY 2 view CXR interpreted by me with pulm vascular congestion and bibasilar infiltrates.  No effusion  Official radiology report(s): DG Chest 2 View  Result Date: 03/24/2022 CLINICAL DATA:  Chest pain EXAM: CHEST - 2 VIEW COMPARISON:  Radiograph 01/04/2022, CT 08/18/2020 FINDINGS: LEFT-sided pacemaker overlies stable cardiac silhouette. Midline sternotomy atrial clip noted. There is bibasilar mild airspace disease. Upper lungs are clear. Lobular periphery calcified mass adjacent to the RIGHT scapula is unchanged from comparison CT. IMPRESSION: 1. Bibasilar airspace disease concerning for  pneumonia 2. Post CABG Electronically Signed   By: Suzy Bouchard M.D.   On: 03/24/2022 15:11    PROCEDURES and INTERVENTIONS:  Procedures  Medications  furosemide (LASIX) injection 60 mg (60 mg Intravenous Given 03/24/22 1753)     IMPRESSION / MDM / ASSESSMENT AND PLAN / ED COURSE  I reviewed the triage vital signs and the nursing notes.  Differential diagnosis includes, but is not limited to, CHF exacerbation, pneumonia, PE, ACS  {Patient presents with symptoms of an acute illness or injury that is potentially life-threatening.  87 year old male with CHF presents to the ED with acute shortness of breath and orthopnea with evidence of a CHF exacerbation ultimately amenable to a trial of outpatient management with close CHF clinic follow-up.  While he is triaged with chest pain, he denies any chest pain to me and just reports respiratory symptoms with orthopnea and dyspnea on exertion.  He appears volume overloaded with increased weight gain and elevated BNP.  He has no leukocytosis and his procalcitonin is negative, infection is unlikely.  CKD around baseline and his BNP is quite elevated.  Troponin is just minimally elevated and likely due to his respiratory status.  No signs of ACS or acute ischemia.  He has greater than 1 L of urine out and reports feeling much better and requesting discharge.  I think this is reasonable to trial outpatient management.  We will have him follow-up closely with CHF clinic and we discussed return precautions.  Clinical Course as of 03/25/22 0014  Wed Mar 24, 2022  1956 Reassessed.  Patient was put out about 925 cc of urine and is asking for another urinal because he has to void more.  He reports feeling much much better now and is requesting discharge.  I think this is reasonable considering how well he looks.  We discussed CHF clinic follow-up and return precautions for the ED. [DS]    Clinical Course User Index [DS] Vladimir Crofts, MD     FINAL  CLINICAL IMPRESSION(S) / ED DIAGNOSES   Final diagnoses:  Acute on chronic combined systolic and diastolic congestive heart failure ([place])  Shortness of breath  Orthopnea     Rx / DC Orders   ED Discharge Orders          Ordered    AMB referral to CHF clinic        03/24/22 1957             Note:  This document was prepared using Dragon voice recognition software and may include unintentional dictation errors.  Vladimir Crofts, MD 03/25/22 [phone]

## 2022-03-28 ENCOUNTER — Other Ambulatory Visit: Payer: Self-pay

## 2022-03-28 ENCOUNTER — Emergency Department: Payer: Medicare HMO

## 2022-03-28 ENCOUNTER — Inpatient Hospital Stay
Admission: EM | Admit: 2022-03-28 | Discharge: 2022-04-01 | DRG: 291 | Disposition: A | Discharge status: Home / Self Care | Payer: Medicare HMO | Attending: Internal Medicine | Admitting: Internal Medicine

## 2022-03-28 DIAGNOSIS — Z951 Presence of aortocoronary bypass graft: Secondary | ICD-10-CM

## 2022-03-28 DIAGNOSIS — I5043 Acute on chronic combined systolic (congestive) and diastolic (congestive) heart failure: Secondary | ICD-10-CM | POA: Diagnosis present

## 2022-03-28 DIAGNOSIS — Z91148 Patient's other noncompliance with medication regimen for other reason: Secondary | ICD-10-CM

## 2022-03-28 DIAGNOSIS — Z8249 Family history of ischemic heart disease and other diseases of the circulatory system: Secondary | ICD-10-CM

## 2022-03-28 DIAGNOSIS — Z79899 Other long term (current) drug therapy: Secondary | ICD-10-CM

## 2022-03-28 DIAGNOSIS — I130 Hypertensive heart and chronic kidney disease with heart failure and stage 1 through stage 4 chronic kidney disease, or unspecified chronic kidney disease: Secondary | ICD-10-CM | POA: Diagnosis present

## 2022-03-28 DIAGNOSIS — Z8674 Personal history of sudden cardiac arrest: Secondary | ICD-10-CM

## 2022-03-28 DIAGNOSIS — Z7902 Long term (current) use of antithrombotics/antiplatelets: Secondary | ICD-10-CM

## 2022-03-28 DIAGNOSIS — Z888 Allergy status to other drugs, medicaments and biological substances status: Secondary | ICD-10-CM | POA: Diagnosis not present

## 2022-03-28 DIAGNOSIS — N183 Chronic kidney disease, stage 3 unspecified: Secondary | ICD-10-CM | POA: Diagnosis not present

## 2022-03-28 DIAGNOSIS — E1122 Type 2 diabetes mellitus with diabetic chronic kidney disease: Secondary | ICD-10-CM | POA: Diagnosis present

## 2022-03-28 DIAGNOSIS — Z794 Long term (current) use of insulin: Secondary | ICD-10-CM

## 2022-03-28 DIAGNOSIS — R778 Other specified abnormalities of plasma proteins: Secondary | ICD-10-CM

## 2022-03-28 DIAGNOSIS — I160 Hypertensive urgency: Secondary | ICD-10-CM

## 2022-03-28 DIAGNOSIS — E785 Hyperlipidemia, unspecified: Secondary | ICD-10-CM | POA: Diagnosis present

## 2022-03-28 DIAGNOSIS — Z87891 Personal history of nicotine dependence: Secondary | ICD-10-CM

## 2022-03-28 DIAGNOSIS — Z9861 Coronary angioplasty status: Secondary | ICD-10-CM | POA: Diagnosis not present

## 2022-03-28 DIAGNOSIS — N1832 Chronic kidney disease, stage 3b: Secondary | ICD-10-CM | POA: Diagnosis present

## 2022-03-28 DIAGNOSIS — I453 Trifascicular block: Secondary | ICD-10-CM | POA: Diagnosis present

## 2022-03-28 DIAGNOSIS — I252 Old myocardial infarction: Secondary | ICD-10-CM

## 2022-03-28 DIAGNOSIS — I509 Heart failure, unspecified: Secondary | ICD-10-CM | POA: Insufficient documentation

## 2022-03-28 DIAGNOSIS — I442 Atrioventricular block, complete: Secondary | ICD-10-CM | POA: Diagnosis present

## 2022-03-28 DIAGNOSIS — I255 Ischemic cardiomyopathy: Secondary | ICD-10-CM | POA: Diagnosis present

## 2022-03-28 DIAGNOSIS — K219 Gastro-esophageal reflux disease without esophagitis: Secondary | ICD-10-CM

## 2022-03-28 DIAGNOSIS — E1169 Type 2 diabetes mellitus with other specified complication: Secondary | ICD-10-CM

## 2022-03-28 DIAGNOSIS — Z950 Presence of cardiac pacemaker: Secondary | ICD-10-CM

## 2022-03-28 DIAGNOSIS — I248 Other forms of acute ischemic heart disease: Secondary | ICD-10-CM | POA: Diagnosis not present

## 2022-03-28 DIAGNOSIS — R7989 Other specified abnormal findings of blood chemistry: Secondary | ICD-10-CM

## 2022-03-28 DIAGNOSIS — Z20822 Contact with and (suspected) exposure to covid-19: Secondary | ICD-10-CM | POA: Diagnosis present

## 2022-03-28 DIAGNOSIS — I2510 Atherosclerotic heart disease of native coronary artery without angina pectoris: Secondary | ICD-10-CM | POA: Diagnosis present

## 2022-03-28 LAB — CBC WITH DIFFERENTIAL/PLATELET
Abs Immature Granulocytes: 0.01 10*3/uL (ref 0.00–0.07)
Basophils Absolute: 0 10*3/uL (ref 0.0–0.1)
Basophils Relative: 1 %
Eosinophils Absolute: 0.2 10*3/uL (ref 0.0–0.5)
Eosinophils Relative: 4 %
HCT: 45.3 % (ref 39.0–52.0)
Hemoglobin: 13.7 g/dL (ref 13.0–17.0)
Immature Granulocytes: 0 %
Lymphocytes Relative: 18 %
Lymphs Abs: 1 10*3/uL (ref 0.7–4.0)
MCH: 25.8 pg — ABNORMAL LOW (ref 26.0–34.0)
MCHC: 30.2 g/dL (ref 30.0–36.0)
MCV: 85.2 fL (ref 80.0–100.0)
Monocytes Absolute: 0.5 10*3/uL (ref 0.1–1.0)
Monocytes Relative: 9 %
Neutro Abs: 3.7 10*3/uL (ref 1.7–7.7)
Neutrophils Relative %: 68 %
Platelets: 181 10*3/uL (ref 150–400)
RBC: 5.32 MIL/uL (ref 4.22–5.81)
RDW: 17.1 % — ABNORMAL HIGH (ref 11.5–15.5)
WBC: 5.4 10*3/uL (ref 4.0–10.5)
nRBC: 0 % (ref 0.0–0.2)

## 2022-03-28 LAB — COMPREHENSIVE METABOLIC PANEL
ALT: 18 U/L (ref 0–44)
AST: 21 U/L (ref 15–41)
Albumin: 2.9 g/dL — ABNORMAL LOW (ref 3.5–5.0)
Alkaline Phosphatase: 63 U/L (ref 38–126)
Anion gap: 4 — ABNORMAL LOW (ref 5–15)
BUN: 24 mg/dL — ABNORMAL HIGH (ref 8–23)
CO2: 24 mmol/L (ref 22–32)
Calcium: 8.1 mg/dL — ABNORMAL LOW (ref 8.9–10.3)
Chloride: 115 mmol/L — ABNORMAL HIGH (ref 98–111)
Creatinine, Ser: 1.56 mg/dL — ABNORMAL HIGH (ref 0.61–1.24)
GFR, Estimated: 44 mL/min — ABNORMAL LOW (ref >60)
Glucose, Bld: 86 mg/dL (ref 70–99)
Potassium: 3.9 mmol/L (ref 3.5–5.1)
Sodium: 143 mmol/L (ref 135–145)
Total Bilirubin: 0.8 mg/dL (ref 0.3–1.2)
Total Protein: 5.7 g/dL — ABNORMAL LOW (ref 6.5–8.1)

## 2022-03-28 LAB — GLUCOSE, CAPILLARY
Glucose-Capillary: 68 mg/dL — ABNORMAL LOW (ref 70–99)
Glucose-Capillary: 111 mg/dL — ABNORMAL HIGH (ref 70–99)

## 2022-03-28 LAB — RESP PANEL BY RT-PCR (FLU A&B, COVID) ARPGX2
Influenza A by PCR: NEGATIVE
Influenza B by PCR: NEGATIVE
SARS Coronavirus 2 by RT PCR: NEGATIVE

## 2022-03-28 LAB — BRAIN NATRIURETIC PEPTIDE: B Natriuretic Peptide: 3468.6 pg/mL — ABNORMAL HIGH (ref 0.0–100.0)

## 2022-03-28 LAB — PROTIME-INR
INR: 1.1 (ref 0.8–1.2)
Prothrombin Time: 14 seconds (ref 11.4–15.2)

## 2022-03-28 LAB — APTT: aPTT: 28 s (ref 24–36)

## 2022-03-28 LAB — TROPONIN I (HIGH SENSITIVITY)
Troponin I (High Sensitivity): 191 ng/L (ref <18)
Troponin I (High Sensitivity): 212 ng/L (ref <18)

## 2022-03-28 MED ORDER — ALBUTEROL SULFATE HFA 108 (90 BASE) MCG/ACT IN AERS: 2.0000 | INHALATION_SPRAY | Freq: Four times a day (QID) | RESPIRATORY_TRACT | Status: DC | PRN

## 2022-03-28 MED ORDER — CARVEDILOL 3.125 MG PO TABS
3.1250 mg | ORAL_TABLET | Freq: Two times a day (BID) | ORAL | Status: DC
  Administered 2022-03-29 – 2022-04-01 (×7): 3.125 mg via ORAL
  Filled 2022-03-28 (×8): qty 1

## 2022-03-28 MED ORDER — ROSUVASTATIN CALCIUM 10 MG PO TABS
40.0000 mg | ORAL_TABLET | Freq: Every day | ORAL | Status: DC
  Administered 2022-03-29 – 2022-04-01 (×4): 40 mg via ORAL
  Filled 2022-03-28 (×4): qty 4

## 2022-03-28 MED ORDER — ALPRAZOLAM 0.25 MG PO TABS
0.2500 mg | ORAL_TABLET | Freq: Two times a day (BID) | ORAL | Status: DC | PRN
Start: 2022-03-28 — End: 2022-04-01

## 2022-03-28 MED ORDER — FUROSEMIDE 10 MG/ML IJ SOLN
80.0000 mg | Freq: Once | INTRAMUSCULAR | Status: AC
  Administered 2022-03-28: 80 mg via INTRAVENOUS
  Filled 2022-03-28: qty 8

## 2022-03-28 MED ORDER — INSULIN ASPART 100 UNIT/ML IJ SOLN
0.0000 [IU] | Freq: Three times a day (TID) | INTRAMUSCULAR | Status: DC
  Administered 2022-03-29 (×4): 2 [IU] via SUBCUTANEOUS
  Administered 2022-03-30 (×2): 3 [IU] via SUBCUTANEOUS
  Administered 2022-03-30: 1 [IU] via SUBCUTANEOUS
  Administered 2022-03-31: 5 [IU] via SUBCUTANEOUS
  Administered 2022-03-31 (×2): 2 [IU] via SUBCUTANEOUS
  Administered 2022-04-01: 5 [IU] via SUBCUTANEOUS
  Filled 2022-03-28 (×11): qty 1

## 2022-03-28 MED ORDER — METOLAZONE 2.5 MG PO TABS
2.5000 mg | ORAL_TABLET | Freq: Every day | ORAL | Status: DC
Start: 2022-03-28 — End: 2022-04-01
  Administered 2022-03-28 – 2022-04-01 (×5): 2.5 mg via ORAL
  Filled 2022-03-28 (×5): qty 1

## 2022-03-28 MED ORDER — SODIUM CHLORIDE 0.9% FLUSH: 3.0000 mL | INTRAVENOUS | Status: DC | PRN

## 2022-03-28 MED ORDER — ASPIRIN 81 MG PO TBEC
81.0000 mg | DELAYED_RELEASE_TABLET | Freq: Every day | ORAL | Status: DC
  Administered 2022-03-28 – 2022-04-01 (×5): 81 mg via ORAL
  Filled 2022-03-28 (×5): qty 1

## 2022-03-28 MED ORDER — PANTOPRAZOLE SODIUM 40 MG PO TBEC
40.0000 mg | DELAYED_RELEASE_TABLET | Freq: Two times a day (BID) | ORAL | Status: DC
  Administered 2022-03-28 – 2022-04-01 (×8): 40 mg via ORAL
  Filled 2022-03-28 (×8): qty 1

## 2022-03-28 MED ORDER — CLOPIDOGREL BISULFATE 75 MG PO TABS
75.0000 mg | ORAL_TABLET | Freq: Every day | ORAL | Status: DC
  Administered 2022-03-28 – 2022-04-01 (×5): 75 mg via ORAL
  Filled 2022-03-28 (×5): qty 1

## 2022-03-28 MED ORDER — HEPARIN (PORCINE) 25000 UT/250ML-% IV SOLN
1250.0000 [IU]/h | INTRAVENOUS | Status: DC
  Administered 2022-03-28: 1250 [IU]/h via INTRAVENOUS
  Filled 2022-03-28: qty 250

## 2022-03-28 MED ORDER — SODIUM CHLORIDE 0.9% FLUSH
3.0000 mL | Freq: Two times a day (BID) | INTRAVENOUS | Status: DC
  Administered 2022-03-29 – 2022-04-01 (×6): 3 mL via INTRAVENOUS

## 2022-03-28 MED ORDER — SODIUM CHLORIDE 0.9 % IV SOLN: 250.0000 mL | INTRAVENOUS | Status: DC | PRN

## 2022-03-28 MED ORDER — ACETAMINOPHEN 325 MG PO TABS: 650.0000 mg | ORAL_TABLET | ORAL | Status: DC | PRN

## 2022-03-28 MED ORDER — POTASSIUM CHLORIDE CRYS ER 20 MEQ PO TBCR
20.0000 meq | EXTENDED_RELEASE_TABLET | Freq: Every day | ORAL | Status: DC
  Administered 2022-03-28 – 2022-04-01 (×5): 20 meq via ORAL
  Filled 2022-03-28 (×5): qty 1

## 2022-03-28 MED ORDER — FUROSEMIDE 10 MG/ML IJ SOLN
60.0000 mg | Freq: Two times a day (BID) | INTRAMUSCULAR | Status: DC
  Administered 2022-03-29: 60 mg via INTRAVENOUS
  Filled 2022-03-28: qty 6

## 2022-03-28 MED ORDER — HYDRALAZINE HCL 25 MG PO TABS
25.0000 mg | ORAL_TABLET | Freq: Two times a day (BID) | ORAL | Status: DC
  Administered 2022-03-29 – 2022-04-01 (×7): 25 mg via ORAL
  Filled 2022-03-28 (×7): qty 1

## 2022-03-28 MED ORDER — HEPARIN BOLUS VIA INFUSION
4000.0000 [IU] | Freq: Once | INTRAVENOUS | Status: AC
  Administered 2022-03-28: 4000 [IU] via INTRAVENOUS
  Filled 2022-03-28: qty 4000

## 2022-03-28 MED ORDER — ONDANSETRON HCL 4 MG/2ML IJ SOLN: 4.0000 mg | Freq: Four times a day (QID) | INTRAMUSCULAR | Status: DC | PRN

## 2022-03-28 MED ORDER — ISOSORBIDE MONONITRATE ER 30 MG PO TB24
30.0000 mg | ORAL_TABLET | Freq: Every day | ORAL | Status: DC
  Administered 2022-03-29 – 2022-04-01 (×4): 30 mg via ORAL
  Filled 2022-03-28 (×4): qty 1

## 2022-03-28 MED ORDER — ADULT MULTIVITAMIN W/MINERALS CH
1.0000 | ORAL_TABLET | Freq: Every day | ORAL | Status: DC
  Administered 2022-03-29 – 2022-04-01 (×4): 1 via ORAL
  Filled 2022-03-28 (×4): qty 1

## 2022-03-28 MED ORDER — ALBUTEROL SULFATE (2.5 MG/3ML) 0.083% IN NEBU: 2.5000 mg | INHALATION_SOLUTION | Freq: Four times a day (QID) | RESPIRATORY_TRACT | Status: DC | PRN

## 2022-03-28 MED ORDER — SACUBITRIL-VALSARTAN 24-26 MG PO TABS
1.0000 | ORAL_TABLET | Freq: Two times a day (BID) | ORAL | Status: DC
  Administered 2022-03-28 – 2022-04-01 (×8): 1 via ORAL
  Filled 2022-03-28 (×9): qty 1

## 2022-03-28 MED ORDER — INSULIN DETEMIR 100 UNIT/ML ~~LOC~~ SOLN
15.0000 [IU] | Freq: Every day | SUBCUTANEOUS | Status: DC
  Administered 2022-03-29 – 2022-03-31 (×3): 15 [IU] via SUBCUTANEOUS
  Filled 2022-03-28 (×6): qty 0.15

## 2022-03-28 MED ORDER — MAGNESIUM OXIDE 400 MG PO TABS
400.0000 mg | ORAL_TABLET | Freq: Every day | ORAL | Status: DC
  Administered 2022-03-29 – 2022-04-01 (×4): 400 mg via ORAL
  Filled 2022-03-28 (×8): qty 1

## 2022-03-28 NOTE — Consult Note (Signed)
ANTICOAGULATION CONSULT NOTE - Follow Up Consult  Pharmacy Consult for heparin gtt Indication: chest pain/ACS  Allergies  Allergen Reactions   Angiotensin Receptor Blockers     hyperkalemia   Metformin Diarrhea   Spironolactone     Hyperkalemia     Patient Measurements: Height: '5\' 8"'$  (172.7 cm) Weight: 107 kg (236 lb) IBW/kg (Calculated) : 68.4 Heparin Dosing Weight: 92kg  Vital Signs: Temp: 97.7 F (36.5 C) (05/21 1716) Temp Source: Oral (05/21 1716) BP: 158/99 (05/21 1900) Pulse Rate: 78 (05/21 1900)  Labs: Recent Labs    03/28/22 1713  HGB 13.7  HCT 45.3  PLT 181  CREATININE 1.56*  TROPONINIHS 191*   Estimated Creatinine Clearance: 41.8 mL/min (A) (by C-G formula based on SCr of 1.56 mg/dL (H)). Heparin Dosing Weight: 92kg  Medications:  PTA: Plavix '75mg'$  QD, no AC listed. Inpatient: +Heparin gtt  Assessment: 87yo M w/ h/o sysCHF (EF 35-40%, CAD (s/p CABG), h/o Cardiac arrest, CKD3, & metabolic s/o presents with CP ISO 10lb wt gain (CHF). Pharmacy consulted for initiation & mgmt of heparin gtt.  Date Time aPTT/HL Rate/Comment       Baseline Labs: aPTT - sent Hgb - 13.7 Plts - 181 Trop - 191 > __   Goal of Therapy:  Heparin level 0.3-0.7 units/ml Monitor platelets by anticoagulation protocol: Yes   Plan:  No AC noted PTA per chart review. Bolus 4000 x1; then start heparin infusion at 1250 units/hr Check anti-Xa level in 8 hours and daily once consecutively therapeutic. Continue to monitor H&H and platelets daily while on heparin gtt.  Lorna Dibble, PharmD, [facility] Clinical Pharmacist 03/28/2022 7:23 PM

## 2022-03-28 NOTE — ED Provider Notes (Signed)
[facility] Provider Note    Event Date/Time   First MD Initiated Contact with Patient 03/28/22 1711     (approximate)   History   Chief Complaint: Shortness of Breath (Patient presents with shortness of breath that started last night; History of CHF and reports >10 lb weight gain from his baseline; Tachypenic upon arrival with RR of 30, 96% on RA)   HPI  [name] is a 87 y.o. male with a history of hypertension, insulin-dependent diabetes, heart failure with reduced EF, heart block, prior CABG who comes ED complaining of worsening shortness of breath for the past week, gradual onset.  Associate with dyspnea on exertion.  He is been to the hospital a few times recently for similar symptoms, tried escalating his diuretics, without relief.  He reports a 10 pound weight gain over the last few days.  He has increasing peripheral edema.     Physical Exam   Triage Vital Signs: ED Triage Vitals  Enc Vitals Group     BP 03/28/22 1716 (!) 163/104     Pulse Rate 03/28/22 1716 74     Resp 03/28/22 1716 (!) 30     Temp 03/28/22 1716 97.7 F (36.5 C)     Temp Source 03/28/22 1716 Oral     SpO2 03/28/22 1716 96 %     Weight 03/28/22 1717 236 lb (107 kg)     Height 03/28/22 1717 '5\' 8"'$  (1.727 m)     Head Circumference --      Peak Flow --      Pain Score 03/28/22 1716 0     Pain Loc --      Pain Edu? --      Excl. in [place]? --     Most recent vital signs: Vitals:   03/28/22 1716 03/28/22 1800  BP: (!) 163/104 (!) 149/101  Pulse: 74 68  Resp: (!) 30 (!) 25  Temp: 97.7 F (36.5 C)   SpO2: 96% 100%    General: Awake, no distress.  CV:  Good peripheral perfusion.  Regular rate and rhythm.  Normal heart sounds. Resp:  Normal effort.  Slight end expiratory wheeze with bibasilar crackles consistent with pulmonary edema Abd:  No distention.  Soft and nontender Other:  2+ pitting edema bilateral lower extremities, symmetric.  No calf tenderness.  There is  scrotal edema as well.   ED Results / Procedures / Treatments   Labs (all labs ordered are listed, but only abnormal results are displayed) Labs Reviewed  COMPREHENSIVE METABOLIC PANEL - Abnormal; Notable for the following components:      Result Value   Chloride 115 (*)    BUN 24 (*)    Creatinine, Ser 1.56 (*)    Calcium 8.1 (*)    Total Protein 5.7 (*)    Albumin 2.9 (*)    GFR, Estimated 44 (*)    Anion gap 4 (*)    All other components within normal limits  BRAIN NATRIURETIC PEPTIDE - Abnormal; Notable for the following components:   B Natriuretic Peptide 3,468.6 (*)    All other components within normal limits  CBC WITH DIFFERENTIAL/PLATELET - Abnormal; Notable for the following components:   MCH 25.8 (*)    RDW 17.1 (*)    All other components within normal limits  TROPONIN I (HIGH SENSITIVITY) - Abnormal; Notable for the following components:   Troponin I (High Sensitivity) 191 (*)    All other components within normal limits  RESP PANEL BY RT-PCR (FLU A&B, COVID) [id number]  TROPONIN I (HIGH SENSITIVITY)     EKG Interpreted by me Sinus rhythm, rate of 61.  Normal axis, left bundle branch block.  No acute ischemic changes.   RADIOLOGY Chest x-ray viewed and interpreted by me, no frank pulmonary edema or pleural effusion.  No pneumothorax or consolidation.  Radiology report reviewed noting subtle signs of vascular congestion and volume overload.   PROCEDURES:  .Critical Care Performed by: Carrie Mew, MD Authorized by: Carrie Mew, MD   Critical care provider statement:    Critical care time (minutes):  35   Critical care time was exclusive of:  Separately billable procedures and treating other patients   Critical care was necessary to treat or prevent imminent or life-threatening deterioration of the following conditions:  Cardiac failure   Critical care was time spent personally by me on the following activities:  Development of treatment plan  with patient or surrogate, discussions with consultants, evaluation of patient's response to treatment, examination of patient, obtaining history from patient or surrogate, ordering and performing treatments and interventions, ordering and review of laboratory studies, ordering and review of radiographic studies, pulse oximetry, re-evaluation of patient's condition and review of old charts   Care discussed with: admitting provider     MEDICATIONS ORDERED IN ED: Medications  furosemide (LASIX) injection 80 mg (80 mg Intravenous Given 03/28/22 1744)     IMPRESSION / MDM / [place] / ED COURSE  I reviewed the triage vital signs and the nursing notes.                              Differential diagnosis includes, but is not limited to, decompensated CHF with outpatient treatment failure, non-STEMI, acute renal failure, electrolyte abnormality, anemia, pneumonia  Patient's presentation is most consistent with severe exacerbation of chronic illness.  Patient presents with worsening dyspnea on exertion and other heart failure symptoms.  Vital signs are unremarkable, room air oxygen saturation is about 95% but he is more comfortable on 2 L nasal cannula.  Clinically he appears to be in volume overload/heart failure exacerbation.  Chest x-ray shows some signs of pulmonary edema.  Labs show markedly elevated BNP and elevated troponin of 191 compared to a baseline troponin of about 25 for this patient.  I have given him IV Lasix to initiate diuresis.  I think the troponin of 190 is most likely due to strain from heart failure, but cannot rule out NSTEMI at this time so I have started IV heparin per pharmacy protocol.  We will need to admit for IV diuresis and further work-up and telemetry monitoring.       FINAL CLINICAL IMPRESSION(S) / ED DIAGNOSES   Final diagnoses:  Acute on chronic congestive heart failure, unspecified heart failure type (HCC)  Elevated troponin     Rx / DC  Orders   ED Discharge Orders     None        Note:  This document was prepared using Dragon voice recognition software and may include unintentional dictation errors.   Carrie Mew, MD 03/28/22 Lurena Nida

## 2022-03-28 NOTE — H&P (Addendum)
[place]   PATIENT NAME: [name]    MR#:  [id number]  DATE OF BIRTH:  [birth year]  DATE OF ADMISSION:  03/28/2022  PRIMARY CARE PHYSICIAN: Tomasita Morrow, MD   Patient is coming from: Home  REQUESTING/REFERRING PHYSICIAN: Brenton Grills, MD     CHIEF COMPLAINT:   Chief Complaint  Patient presents with   Shortness of Breath    Patient presents with shortness of breath that started last night; History of CHF and reports >10 lb weight gain from his baseline; Tachypenic upon arrival with RR of 30, 96% on RA    HISTORY OF PRESENT ILLNESS:  Aryav Wimberly is a 87 y.o. 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  He admits to dyspnea on exertion as well as orthopnea with occasional PND.  No fever or chills.  No dysuria, oliguria or hematuria or flank pain.  No chest pain or palpitations.  No nausea or vomiting or abdominal pain.  ED Course: When he came to the ER, BP was 163/104 with respiratory rate of 30 and pulse oximetry  96% on room air.  Labs revealed a BUN of 24 and creatinine 1.56 better than previous levels with stage IIIb CKD, to obtain 5.7 albumin 2.9 and BNP was significantly elevated at 3468 compared to 29/3.5 earlier.  I sensitive troponin I was 191 and later 212 CBC was within normal.  Coagulation profile was normal. EKG as reviewed by me : Pending Imaging: Chest x-ray showed vascular congestion and cardiomegaly with increased bibasal atelectasis or infiltrates.  The patient was given 80 mg of IV Lasix and was started on IV heparin bolus and drip.  He will  be admitted to a progressive unit bed for further evaluation and management. PAST MEDICAL HISTORY:   Past Medical History:  Diagnosis Date   AV block, [place] 1    CAD (coronary artery disease)    a. 08/2019 VF arrest-->sev 3vd on cath-->CABGx5 (LIMA->LAD, VG->OM->LCX, VG->RPDA, VG->Diag); b. 08/2021 Cath: sev native multivessel dzs w/ 5/5 patent grafts. Nl filling pressures->Med rx.   CKD (chronic kidney disease), stage III (HCC)    Diabetes mellitus without complication ([place])    HFrEF (heart failure with reduced ejection fraction) ([place])    a. 08/2020 Echo: EF 40-45%; b. 11/2020 Echo: EF 55%, no rwma, mild LVH, Gr2 DD. nl RV fxn; c. 08/2021 Echo: EF 35-40%, glob HK w/ ant/antsept/apical HK. Reduced RV fxn. Mildly dil LA; d. 10/2021 s/p CRT-P; e. 12/2021 Echo: EF 30-35%, glob HK, GrIII DD. Mod red RV fxn, mild LAE, mild MR, AoV sclerosis.   Hyperlipidemia LDL goal <70    Hypertension    Intermittent Complete heart block ([place])    a. 08/2021 noted on Zio; b. 10/2021 s/p Abbott Allure RF CRT-P (Ser # [id number]).   Ischemic cardiomyopathy    a. 08/2020 Echo: EF 40-45%; b. 11/2020 Echo: EF 55%; c. 08/2021 Echo: EF 35-40%; d. 10/2021 s/p CRT-P; e. 12/2021 Echo: EF 30-35%, glob HK, GrIII DD.   Trifascicular block     PAST SURGICAL HISTORY:   Past Surgical History:  Procedure Laterality Date   BACK SURGERY  CARDIAC CATHETERIZATION     CLIPPING OF ATRIAL APPENDAGE N/A 08/29/2020   Procedure: CLIPPING OF ATRIAL APPENDAGE USING ATRICURE 40 MM ATRICLIP FLEX-V;  Surgeon: Grace Isaac, MD;  Location: [facility];  Service: Open Heart Surgery;  Laterality: N/A;   CORONARY ARTERY BYPASS GRAFT N/A 08/29/2020   Procedure: CORONARY ARTERY BYPASS GRAFTING (CABG) X 5 USING LEFT INTERNAL MAMMARY ARTERY AND ENDOSCOPICALLY HARVESTED RIGHT GREATER SAPHENOUS VEIN. LIMA TO LAD, SVG TO OM SEQ TO CIRC, SVG TO PD, SVG TO DIAG.;  Surgeon: Grace Isaac, MD;  Location: [facility];  Service: Open Heart Surgery;   Laterality: N/A;   ENDOVEIN HARVEST OF GREATER SAPHENOUS VEIN Right 08/29/2020   Procedure: ENDOVEIN HARVEST OF GREATER SAPHENOUS VEIN;  Surgeon: Grace Isaac, MD;  Location: [facility];  Service: Open Heart Surgery;  Laterality: Right;   HERNIA REPAIR     LEFT HEART CATH AND CORONARY ANGIOGRAPHY N/A 08/25/2020   Procedure: LEFT HEART CATH AND CORONARY ANGIOGRAPHY;  Surgeon: [name], MD;  Location: [facility];  Service: Cardiovascular;  Laterality: N/A;   PACEMAKER IMPLANT N/A 10/12/2021   Procedure: PACEMAKER IMPLANT;  Surgeon: Vickie Epley, MD;  Location: [facility];  Service: Cardiovascular;  Laterality: N/A;   RIGHT/LEFT HEART CATH AND CORONARY ANGIOGRAPHY N/A 08/31/2021   Procedure: RIGHT/LEFT HEART CATH AND CORONARY ANGIOGRAPHY;  Surgeon: [name], MD;  Location: [facility];  Service: Cardiovascular;  Laterality: N/A;   TEE WITHOUT CARDIOVERSION N/A 08/29/2020   Procedure: TRANSESOPHAGEAL ECHOCARDIOGRAM (TEE);  Surgeon: Grace Isaac, MD;  Location: [facility];  Service: Open Heart Surgery;  Laterality: N/A;    SOCIAL HISTORY:   Social History   Tobacco Use   Smoking status: Former    Types: Cigarettes    Quit date: 07/26/1996    Years since quitting: 25.6   Smokeless tobacco: Never  Substance Use Topics   Alcohol use: Not Currently    FAMILY HISTORY:   Family History  Problem Relation Age of Onset   Heart attack Mother     DRUG ALLERGIES:   Allergies  Allergen Reactions   Angiotensin Receptor Blockers     hyperkalemia   Metformin Diarrhea   Spironolactone     Hyperkalemia     REVIEW OF SYSTEMS:   ROS As per history of present illness. All pertinent systems were reviewed above. Constitutional, HEENT, cardiovascular, respiratory, GI, GU, musculoskeletal, neuro, psychiatric, endocrine, integumentary and hematologic systems were reviewed and are otherwise negative/unremarkable except for positive findings  mentioned above in the HPI.   MEDICATIONS AT HOME:   Prior to Admission medications   Medication Sig Start Date End Date Taking? Authorizing Provider  acyclovir (ZOVIRAX) 800 MG tablet Take 800 mg by mouth daily. 12/02/20  Yes [provider]  carvedilol (COREG) 3.125 MG tablet Take 1 tablet (3.125 mg total) by mouth 2 (two) times daily with a meal. 11/12/21  Yes [name], MD  clopidogrel (PLAVIX) 75 MG tablet TAKE 1 TABLET BY MOUTH EVERY DAY Patient taking differently: Take 75 mg by mouth daily. 05/17/21  Yes Loel Dubonnet, NP  ENTRESTO 24-26 MG Take 1 tablet by mouth 2 (two) times daily. 02/24/22  Yes [provider]  furosemide (LASIX) 80 MG tablet Take 1 tablet (80 mg total) by mouth daily. 10/30/21  Yes Enzo Bi, MD  hydrALAZINE (APRESOLINE) 25 MG tablet Take 25 mg by mouth 2 times daily at 12 noon and 4 pm.  Yes [provider]  insulin detemir (LEVEMIR) 100 UNIT/ML injection Inject 0.15 mLs (15 Units total) into the skin at bedtime. 10/30/21  Yes Enzo Bi, MD  isosorbide mononitrate (IMDUR) 30 MG 24 hr tablet Take 1 tablet (30 mg total) by mouth daily. 11/25/21 03/28/22 Yes Hackney, Otila Kluver A, FNP  magnesium oxide (MAG-OX) 400 MG tablet Take 1 tablet (400 mg total) by mouth daily. 01/20/22  Yes Vickie Epley, MD  Multiple Vitamin (MULTIVITAMIN WITH MINERALS) TABS tablet Take 1 tablet by mouth daily. 09/11/20  Yes Gold, Wayne E, PA-C  pantoprazole (PROTONIX) 40 MG tablet Take 40 mg by mouth 2 (two) times daily.   Yes [provider]  potassium chloride SA (KLOR-CON M) 20 MEQ tablet Take 1 tablet (20 mEq total) by mouth daily. 01/28/22  Yes Hackney, Otila Kluver A, FNP  rosuvastatin (CRESTOR) 40 MG tablet TAKE 1 TABLET BY MOUTH EVERY DAY Patient taking differently: Take 40 mg by mouth daily. 12/14/21  Yes [name], MD  acetaminophen (TYLENOL) 500 MG tablet Take 500-1,000 mg by mouth every 6 (six) hours as needed for mild pain or moderate pain.      [provider]  albuterol (VENTOLIN HFA) 108 (90 Base) MCG/ACT inhaler Inhale 2 puffs into the lungs every 6 (six) hours as needed for wheezing or shortness of breath. 09/30/21   [provider]  fluticasone (FLOVENT HFA) 110 MCG/ACT inhaler Inhale into the lungs as needed. Patient not taking: Reported on 03/28/2022    [provider]  metolazone (ZAROXOLYN) 2.5 MG tablet Take 1 tablet (2.5) mg 1 hour before Lasix weekly on Tuesdays 02/23/22   Alisa Graff, FNP      VITAL SIGNS:  Blood pressure (!) 154/94, pulse 83, temperature 98.5 F (36.9 C), temperature source Oral, resp. rate 18, height '5\' 8"'$  (1.727 m), weight 107 kg, SpO2 99 %.  PHYSICAL EXAMINATION:  Physical Exam  GENERAL:  87 y.o.-year-old African-American male patient lying in the bed with mild respiratory distress with conversational dyspnea. EYES: Pupils equal, round, reactive to light and accommodation. No scleral icterus. Extraocular muscles intact.  HEENT: Head atraumatic, normocephalic. Oropharynx and nasopharynx clear.  NECK:  Supple, no jugular venous distention. No thyroid enlargement, no tenderness.  LUNGS: Diminished bibasilar breath sounds with bibasilar rales.  No use of accessory muscles of respiration.  CARDIOVASCULAR: Regular rate and rhythm, S1, S2 normal. No murmurs, rubs, or gallops.  ABDOMEN: Soft, nondistended, nontender. Bowel sounds present. No organomegaly or mass.  EXTREMITIES: 1+ bilateral lower extremity pitting edema with no cyanosis, or clubbing.  NEUROLOGIC: Cranial nerves II through XII are intact. Muscle strength 5/5 in all extremities. Sensation intact. Gait not checked.  PSYCHIATRIC: The patient is alert and oriented x 3.  Normal affect and good eye contact. SKIN: No obvious rash, lesion, or ulcer.   LABORATORY PANEL:   CBC Recent Labs  Lab 03/28/22 1713  WBC 5.4  HGB 13.7  HCT 45.3  PLT 181    ------------------------------------------------------------------------------------------------------------------  Chemistries  Recent Labs  Lab 03/28/22 1713  NA 143  K 3.9  CL 115*  CO2 24  GLUCOSE 86  BUN 24*  CREATININE 1.56*  CALCIUM 8.1*  AST 21  ALT 18  ALKPHOS 63  BILITOT 0.8   ------------------------------------------------------------------------------------------------------------------  Cardiac Enzymes No results for input(s): TROPONINI in the last 168 hours. ------------------------------------------------------------------------------------------------------------------  RADIOLOGY:  DG Chest 1 View  Result Date: 03/28/2022 CLINICAL DATA:  Shortness of breath EXAM: CHEST  1 VIEW COMPARISON:  03/24/2022  FINDINGS: Cardiomegaly. Left pacer remains in place, unchanged. Prior CABG. Mild vascular congestion. Increased densities in the lung bases could reflect atelectasis or infiltrates. Calcified subscapular mass in the right posterior chest wall is again noted and unchanged as seen on prior CT. IMPRESSION: Cardiomegaly, vascular congestion. Increasing bibasilar atelectasis or infiltrates. Electronically Signed   By: Rolm Baptise M.D.   On: 03/28/2022 18:02      IMPRESSION AND PLAN:  Assessment and Plan: * Acute on chronic combined systolic and diastolic CHF (congestive heart failure) ([place]) - The patient will be admitted to a progressive unit bed. - We will continue diuresis with IV Lasix. - We will follow serial troponins. - We will continue Entresto and Coreg as well as Imdur. - We will make his Zaroxolyn daily while he is here. - Cardiology consult will be obtained. - I notified Dr. Garen Lah about the patient. - The patient's last 2D echo revealed an EF of 30 to 35% with grade 3 diastolic dysfunction and mild dilatation of the left atrium, mild mitral regurgitation.  Hypertensive urgency - The patient will be continued on his antihypertensives. - We  will place him on as needed IV labetalol.  Elevated troponin - This could be demand ischemia from his acute CHF especially in the setting of stage IIIb chronic kidney disease. - He could be developing non-STEMI contributing to his CHF as well. - We will follow serial troponins. - We will add aspirin and continue Plavix as well as IV heparin, beta-blockers and statin therapy. - Cardiology consult to be obtained as mentioned above.   Type 2 diabetes mellitus with chronic kidney disease, with long-term current use of insulin ([place]) - The patient will be placed on supplement coverage with NovoLog. - We will continue basal coverage.  Dyslipidemia - We will continue statin therapy.  Coronary artery disease without angina pectoris - We will continue Imdur.  There is no and Plavix as well as continue beta-blocker therapy with Coreg and statin therapy.  GERD without esophagitis - We will continue PPI therapy.   DVT prophylaxis: IV heparin. Advanced Care Planning:  Code Status: full code. Family Communication:  The plan of care was discussed in details with the patient (and family). I answered all questions. The patient agreed to proceed with the above mentioned plan. Further management will depend upon hospital course. Disposition Plan: Back to previous home environment Consults called: Cardiology. All the records are reviewed and case discussed with ED provider.  Status is: Inpatient   At the time of the admission, it appears that the appropriate admission status for this patient is inpatient.  This is judged to be reasonable and necessary in order to provide the required intensity of service to ensure the patient's safety given the presenting symptoms, physical exam findings and initial radiographic and laboratory data in the context of comorbid conditions.  The patient requires inpatient status due to high intensity of service, high risk of further deterioration and high frequency of  surveillance required.  I certify that at the time of admission, it is my clinical judgment that the patient will require inpatient hospital care extending more than 2 midnights.                            Dispo: The patient is from: Home              Anticipated d/c is to: Home  Patient currently is not medically stable to d/c.              Difficult to place patient: No  Christel Mormon M.D on 03/29/2022 at 1:00 AM  [facility]   From 7 PM-7 AM, contact night-coverage [email or web address]  CC: Primary care physician; Tomasita Morrow, MD

## 2022-03-28 NOTE — ED Triage Notes (Signed)
Patient presents with shortness of breath that started last night; History of CHF and reports >10 lb weight gain from his baseline; Tachypenic upon arrival with RR of 30, 96% on RA

## 2022-03-29 ENCOUNTER — Other Ambulatory Visit: Payer: Self-pay

## 2022-03-29 DIAGNOSIS — E1169 Type 2 diabetes mellitus with other specified complication: Secondary | ICD-10-CM

## 2022-03-29 DIAGNOSIS — I5043 Acute on chronic combined systolic (congestive) and diastolic (congestive) heart failure: Secondary | ICD-10-CM | POA: Diagnosis not present

## 2022-03-29 DIAGNOSIS — K219 Gastro-esophageal reflux disease without esophagitis: Secondary | ICD-10-CM

## 2022-03-29 DIAGNOSIS — R7989 Other specified abnormal findings of blood chemistry: Secondary | ICD-10-CM

## 2022-03-29 DIAGNOSIS — E1122 Type 2 diabetes mellitus with diabetic chronic kidney disease: Secondary | ICD-10-CM

## 2022-03-29 DIAGNOSIS — R778 Other specified abnormalities of plasma proteins: Secondary | ICD-10-CM | POA: Diagnosis not present

## 2022-03-29 DIAGNOSIS — I2510 Atherosclerotic heart disease of native coronary artery without angina pectoris: Secondary | ICD-10-CM

## 2022-03-29 DIAGNOSIS — I160 Hypertensive urgency: Secondary | ICD-10-CM

## 2022-03-29 DIAGNOSIS — E785 Hyperlipidemia, unspecified: Secondary | ICD-10-CM

## 2022-03-29 HISTORY — DX: Hypertensive urgency: I16.0

## 2022-03-29 HISTORY — DX: Other specified abnormal findings of blood chemistry: R79.89

## 2022-03-29 LAB — CBC
HCT: 44.3 % (ref 39.0–52.0)
Hemoglobin: 13.3 g/dL (ref 13.0–17.0)
MCH: 25.3 pg — ABNORMAL LOW (ref 26.0–34.0)
MCHC: 30 g/dL (ref 30.0–36.0)
MCV: 84.4 fL (ref 80.0–100.0)
Platelets: 147 10*3/uL — ABNORMAL LOW (ref 150–400)
RBC: 5.25 MIL/uL (ref 4.22–5.81)
RDW: 17.2 % — ABNORMAL HIGH (ref 11.5–15.5)
WBC: 5.6 10*3/uL (ref 4.0–10.5)
nRBC: 0 % (ref 0.0–0.2)

## 2022-03-29 LAB — BASIC METABOLIC PANEL
Anion gap: 7 (ref 5–15)
BUN: 23 mg/dL (ref 8–23)
CO2: 27 mmol/L (ref 22–32)
Calcium: 8.4 mg/dL — ABNORMAL LOW (ref 8.9–10.3)
Chloride: 110 mmol/L (ref 98–111)
Creatinine, Ser: 1.73 mg/dL — ABNORMAL HIGH (ref 0.61–1.24)
GFR, Estimated: 38 mL/min — ABNORMAL LOW (ref >60)
Glucose, Bld: 116 mg/dL — ABNORMAL HIGH (ref 70–99)
Potassium: 3.9 mmol/L (ref 3.5–5.1)
Sodium: 144 mmol/L (ref 135–145)

## 2022-03-29 LAB — GLUCOSE, CAPILLARY
Glucose-Capillary: 196 mg/dL — ABNORMAL HIGH (ref 70–99)
Glucose-Capillary: 179 mg/dL — ABNORMAL HIGH (ref 70–99)
Glucose-Capillary: 169 mg/dL — ABNORMAL HIGH (ref 70–99)
Glucose-Capillary: 185 mg/dL — ABNORMAL HIGH (ref 70–99)

## 2022-03-29 LAB — HEPARIN LEVEL (UNFRACTIONATED)
Heparin Unfractionated: 0.5 IU/mL (ref 0.30–0.70)
Heparin Unfractionated: 0.15 IU/mL — ABNORMAL LOW (ref 0.30–0.70)

## 2022-03-29 MED ORDER — ENOXAPARIN SODIUM 60 MG/0.6ML IJ SOSY
0.5000 mg/kg | PREFILLED_SYRINGE | INTRAMUSCULAR | Status: DC
  Administered 2022-03-29 – 2022-03-31 (×3): 57.5 mg via SUBCUTANEOUS
  Filled 2022-03-29 (×3): qty 0.6

## 2022-03-29 MED ORDER — FUROSEMIDE 10 MG/ML IJ SOLN
80.0000 mg | Freq: Two times a day (BID) | INTRAMUSCULAR | Status: DC
  Administered 2022-03-29 – 2022-03-31 (×4): 80 mg via INTRAVENOUS
  Filled 2022-03-29 (×4): qty 8

## 2022-03-29 NOTE — Progress Notes (Signed)
  Progress Note   Patient: [name] JSE:[id number] DOB: 11/15/1934 DOA: 03/28/2022     1 DOS: the patient was seen and examined on 03/29/2022   Brief hospital course: [name] is a 87 y.o. 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  Assessment and Plan: * Acute on chronic combined systolic and diastolic CHF (congestive heart failure) ([place]) -Patient came in with increasing shortness of breath and weight gain more than 10 pounds. According to family he has been noncompliant with his medications and fluid intake. -continue diuresis with IV Lasix-- change 2. torsemide 40 mg BID per Dr. Fletcher Anon at discharge - continue Entresto and Coreg as well as Imdur. --last 2D echo revealed an EF of 30 to 35% with grade 3 diastolic dysfunction and mild dilatation of the left mitral regurgitation.  Hypertensive urgency -  patient will be continued on his antihypertensives. -  on as needed IV labetalol. -- Blood pressure much improved  Elevated troponin -Likely due to demand ischemia from his acute CHF especially in the setting of stage IIIb chronic kidney disease. -Patient was started on IV heparin drug which has been discontinued by cardiology. Continue aspirin.  Type 2 diabetes mellitus with chronic kidney disease, with long-term current use of insulin ([place]) -  on supplement coverage with NovoLog. - l continue basal coverage.  Dyslipidemia -on  statin therapy.  Coronary artery disease without angina pectoris -continue Imdur.    GERD without esophagitis -  continue PPI therapy.         Subjective: feels better family at bedside  Physical Exam: Vitals:   03/29/22 0348 03/29/22 0426 03/29/22 0911 03/29/22 1243  BP: (!) 141/99  137/86 (!) 134/92  Pulse: 80  81 66  Resp: '20  16 18  '$ Temp: 97.9 F (36.6 C)  97.9 F (36.6 C) (!) 97.4 F (36.3 C)  TempSrc:   Oral   SpO2: 100%  98% 99%  Weight:  114.5 kg    Height:       Physical Exam Constitutional:      Appearance: He is well-developed. He is obese.  Eyes:     Pupils: Pupils are equal, round, and reactive to light.  Cardiovascular:     Rate and Rhythm: Normal rate and regular rhythm.  Pulmonary:     Effort: Pulmonary effort is normal.     Breath sounds: Examination of the right-lower field reveals decreased breath sounds. Examination of the left-lower field reveals decreased breath sounds. Decreased breath sounds present.  Musculoskeletal:        General: Normal range of motion.  Skin:    General: Skin is warm.  Neurological:     Mental Status: He is alert.  Psychiatric:        Mood and Affect: Mood normal.        Behavior: Behavior normal.    Family Communication: dters at bedside  Disposition: Status is: Inpatient Remains inpatient appropriate because: chf exacerbation  Planned Discharge Destination: Home   35 minutes  Author: Fritzi Mandes, MD 03/29/2022 3:44 PM  For on call review [email or web address].
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
## 2022-03-29 NOTE — Assessment & Plan Note (Addendum)
-  continue PPI therapy

## 2022-03-29 NOTE — TOC Progression Note (Signed)
Transition of Care [facility]) - Progression Note    Patient Details  Name: [name] MRN: [id number] Date of Birth: 1935-03-15  [facility]) CM/SW Contact  Laurena Slimmer, RN Phone Number: 03/29/2022, 1:24 PM  Clinical Narrative:    Patient admitted from home 5/21 for sob related to CHF.   Lives with his wife and dtr Wife will transport home at discharge Able to drive himself to appointments [facility] [address]   No current TOC needs at this time will reassess.           Expected Discharge Plan and Services                                                 Social Determinants of Health (SDOH) Interventions    Readmission Risk Interventions    03/29/2022    1:18 PM 01/05/2022    2:16 PM 10/29/2021   10:22 AM  Readmission Risk Prevention Plan  Transportation Screening Complete Complete Complete  PCP or Specialist Appt within 3-5 Days Complete Complete Complete  HRI or Home Care Consult  Complete Complete  Social Work Consult for [place] Planning/Counseling  Complete Complete  Palliative Care Screening Not Applicable Not Applicable Not Applicable  Medication Review Press photographer) Complete Complete Complete

## 2022-03-29 NOTE — Progress Notes (Addendum)
Nutrition Brief Note  RD pulled to chart secondary to CHF admission.   Wt Readings from Last 15 Encounters:  03/29/22 114.5 kg  03/24/22 105.2 kg  02/23/22 105.9 kg  02/02/22 105.7 kg  01/28/22 103.5 kg  01/20/22 108.2 kg  01/06/22 105.2 kg  12/08/21 107.5 kg  11/25/21 104.5 kg  11/12/21 100.9 kg  10/30/21 103 kg  10/13/21 102.5 kg  10/07/21 108 kg  09/17/21 107.6 kg  09/15/21 106.6 kg   Pt with medical history significant for combined systolic and diastolic CHF, coronary artery disease, status post 5 vessel CABG, stage III CKD, type 2 diabetes mellitus hypertension and dyslipidemia and intermittent complete heart block status post pacemaker placement, who presented to with acute onset of worsening dyspnea with associated dry cough and wheezing for the last week as well as associated lower extremity edema.  Pt admitted with CHF.   Pt oh phone at time of visit and did not disengage in conversation. Pt seen by Heart Failure RN; pt follows a low sodium at home and was given a scale due to nonfunctioning scale at home.  RD provided "Low Sodium Nutrition Therapy" handout from [facility] Nutrition Care Manual. Attached to AVS/ discharge summary.  Medications reviewed and include lasix.   Nutrition-Focused physical exam completed. Findings are no fat depletion, no muscle depletion, and moderate edema.    Lab Results  Component Value Date   HGBA1C 6.8 (H) 10/28/2021   PTA DM medications are 15 units insulin detemir daily.   Labs reviewed: CBGS: 68-179 (inpatient orders for glycemic control are 15 units insulin detemir daily at bedtime and 0-9 units insulin aspart daily before meals and at bedtime).    Body mass index is 38.38 kg/m. Patient meets criteria for obesity, class II based on current BMI. Obesity is a complex, chronic medical condition that is optimally managed by a multidisciplinary care team. Weight loss is not an ideal goal for an acute inpatient hospitalization. However, if  further work-up for obesity is warranted, consider outpatient referral to outpatient bariatric service and/or [place] Nutrition and Diabetes Education Services.    Current diet order is heart healthy/ carb modified, patient is consuming approximately 100% of meals at this time. Labs and medications reviewed.   No nutrition interventions warranted at this time. If nutrition issues arise, please consult RD.   Loistine Chance, RD, LDN, [place] Registered Dietitian II Certified Diabetes Care and Education Specialist Please refer to [facility] for RD and/or RD on-call/weekend/after hours pager

## 2022-03-29 NOTE — Assessment & Plan Note (Addendum)
-  Patient came in with increasing shortness of breath and weight gain more than 10 pounds. According to family he has been noncompliant with his medications and fluid intake. -continue diuresis with IV Lasix-- change 2. torsemide 40 mg BID per Dr. Fletcher Anon at discharge - continue Entresto and Coreg as well as Imdur. --last 2D echo revealed an EF of 30 to 35% with grade 3 diastolic dysfunction and mild dilatation of the left mitral regurgitation.

## 2022-03-29 NOTE — Assessment & Plan Note (Addendum)
-    patient will be continued on his antihypertensives. -  on as needed IV labetalol. -- Blood pressure much improved

## 2022-03-29 NOTE — Assessment & Plan Note (Addendum)
-    on supplement coverage with NovoLog. - l continue basal coverage.

## 2022-03-29 NOTE — TOC Initial Note (Signed)
Transition of Care [facility]) - Initial/Assessment Note    Patient Details  Name: [name] MRN: [id number] Date of Birth: [birth year]  Transition of Care [facility]) CM/SW Contact:    Laurena Slimmer, RN Phone Number: 03/29/2022, 10:00 AM  Clinical Narrative:                 [facility] consulted for heart failure screen. Heart failure RN Delmar Landau has been informed.          Patient Goals and CMS Choice        Expected Discharge Plan and Services                                                Prior Living Arrangements/Services                       Activities of Daily Living      Permission Sought/Granted                  Emotional Assessment              Admission diagnosis:  Elevated troponin [R77.8] Acute CHF (congestive heart failure) ([place]) [I50.9] Acute on chronic congestive heart failure, unspecified heart failure type ([place]) [I50.9] Patient Active Problem List   Diagnosis Date Noted   Hypertensive urgency 03/29/2022   Dyslipidemia 03/29/2022   GERD without esophagitis 03/29/2022   Type 2 diabetes mellitus with chronic kidney disease, with long-term current use of insulin ([place]) 03/29/2022   Coronary artery disease without angina pectoris 03/29/2022   Elevated troponin 03/29/2022   Acute CHF (congestive heart failure) ([place]) 03/28/2022   Acute on chronic systolic CHF (congestive heart failure) ([place]) 01/04/2022   Acute on chronic combined systolic and diastolic CHF (congestive heart failure) ([place]) 10/30/2021   Dilated cardiomyopathy ([place]) 10/30/2021   Acute decompensated heart failure ([place]) 10/27/2021   Complete heart block ([place]) 10/12/2021   Junctional bradycardia    Acute on chronic systolic congestive heart failure (HCC)    Acute on chronic diastolic CHF (congestive heart failure) ([place]) 08/26/2021   CAD (coronary artery disease)    HLD (hyperlipidemia)    GERD (gastroesophageal reflux disease)    Type II diabetes mellitus with renal  manifestations (HCC)    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   CAD, multiple vessel 08/25/2020   Non-ST elevation (NSTEMI) myocardial infarction ([place]) 08/21/2020   History of cardiac arrest 08/21/2020   HFrEF (heart failure with reduced ejection fraction) ([place])    History of sustained ventricular fibrillation 08/18/2020   Acute respiratory failure with hypoxia (HCC)    Chronic diastolic heart failure (HCC)    Stage 3a chronic kidney disease ([place]) 11/20/2019   UTI (urinary tract infection) 07/26/2019   HTN (hypertension) 07/26/2019   Diabetes ([place]) 07/26/2019   Unstable angina ([place]) 12/03/2008   Status post percutaneous transluminal coronary angioplasty 10/08/2008   Malignant neoplasm of prostate ([place]) 10/05/2005   Essential hypertension 08/24/1989   PCP:  Tomasita Morrow, MD Pharmacy:   [id number]: [phone]: [phone]    Social Determinants of Health (SDOH) Interventions    Readmission Risk Interventions    01/05/2022    2:16 PM 10/29/2021   10:22 AM 09/10/2020    9:54 AM  Readmission  Risk Prevention Plan  Transportation Screening Complete Complete   PCP or Specialist Appt within 3-5 Days Complete Complete   HRI or Home Care Consult Complete Complete   Social Work Consult for [place] Planning/Counseling Complete Complete   Palliative Care Screening Not Applicable Not Applicable   Medication Review Press photographer) Complete Complete   PCP or Specialist appointment within 3-5 days of discharge   Complete  HRI or [place]   Complete  SW Recovery Care/Counseling Consult   Complete  Palliative Care Screening   Not [place]   Not Applicable

## 2022-03-29 NOTE — Consult Note (Addendum)
   Heart Failure Nurse Navigator Note  HFrEF 30 to 35%.  Grade 3 diastolic dysfunction.  Mild left ventricular hypertrophy.  Mild mitral regurgitation.  He presented to the emergency room with complaints of worsening shortness of breath, lower extremity edema and approximately a 10 pound weight gain.  He had been seen in the emergency room on Mar 24, 2022 for complaints of increasing shortness of breath, orthopnea, cough, lower extremity edema and chest discomfort.  Comorbidities:  Coronary artery disease status post coronary artery bypass grafting x5 Chronic kidney disease stage III Diabetes Hyperlipidemia Hypertension [place]itz 1 AV block status post pacemaker insertion  Medications:  Aspirin 81 mg daily Carvedilol 3.125 mg 2 times a day with meals Plavix 75 mg daily Isosorbide mononitrate 30 mg daily Metolazone 2.5 mg daily Potassium chloride 20 mEq daily Crestor 40 mg daily Entresto 24/26 mg 2 times a day  Labs:  Sodium 144, potassium 3.9, chloride 100, CO2 27, BUN 23, creatinine 1.73 up from 1.56 of yesterday.  BNP on admission was 3468 Weight is 114.5 kg(admission weight documented at 107 kg) Blood pressure 137/86 Intake 1200 mL Output 900 mL   Initial meeting with patient on this admission.  There was no family at the bedside.  Patient states that he had been weighing himself daily weight had been fluctuating from 231 on 1 day would go up to 232 and then the next day would go back down to 231.  He noted that it went from 231 up to 234, he states he did not call the clinic or his physician because he was waiting to see if the weight went back down then with symptoms presented to the emergency room.  Heart failure clinic was unable to get a hold of the patient or any contacts listed to get him into the outpatient heart failure clinic after his emergency room visit.  When he was seen in the outpatient heart failure clinic on May 18 his weight at that time he states he was  running 2 23-25 which when compared to weights of 1 month prior he was up 5 pounds at that time.  Discussed the importance of notifying care providers for weight gain of 2 to 3 pounds overnight or a total of 5 pounds within the week.  Went over his fluid restriction and he is taking in less than 64 ounces in a days time.  Also discussed salt and sodium in his diet.  He states that his there is no salt in the house and his wife is very strict about the foods that contain sodium.  He states that he had been compliant with his medications and had not missed taking any of those.  Spoke with Dr. Fletcher Anon, about his being compliant with medications and sodium and fluids etc. he feels that possibly the Lasix with his kidney disease is not working for him anymore and plans to switch him to torsemide.  He has a follow-up appointment on May 26 at 12:00.  He has a 12% no-show which is 12 out of 100 appointments.  Patient was given scale due to feeling scale was not accurate.  He had no further questions.  I will continue to follow.  Pricilla Riffle RN CHFN

## 2022-03-29 NOTE — Assessment & Plan Note (Addendum)
-  on  statin therapy.

## 2022-03-29 NOTE — Assessment & Plan Note (Addendum)
-  continue Imdur.

## 2022-03-29 NOTE — Consult Note (Signed)
ANTICOAGULATION CONSULT NOTE - Follow Up Consult  Pharmacy Consult for heparin gtt Indication: chest pain/ACS  Allergies  Allergen Reactions   Angiotensin Receptor Blockers     hyperkalemia   Metformin Diarrhea   Spironolactone     Hyperkalemia     Patient Measurements: Height: '5\' 8"'$  (172.7 cm) Weight: 114.5 kg (252 lb 6.8 oz) IBW/kg (Calculated) : 68.4 Heparin Dosing Weight: 92kg  Vital Signs: Temp: 97.9 F (36.6 C) (05/22 0348) Temp Source: Oral (05/21 2057) BP: 141/99 (05/22 0348) Pulse Rate: 80 (05/22 0348)  Labs: Recent Labs    03/28/22 1713 03/28/22 1913 03/29/22 0338  HGB 13.7  --  13.3  HCT 45.3  --  44.3  PLT 181  --  147*  APTT  --  28  --   LABPROT  --  14.0  --   INR  --  1.1  --   HEPARINUNFRC  --   --  0.50  CREATININE 1.56*  --  1.73*  TROPONINIHS 191* 212*  --     Estimated Creatinine Clearance: 39 mL/min (A) (by C-G formula based on SCr of 1.73 mg/dL (H)). Heparin Dosing Weight: 92kg  Medications:  PTA: Plavix '75mg'$  QD, no AC listed. Inpatient: +Heparin gtt  Assessment: 87yo M w/ h/o sysCHF (EF 35-40%, CAD (s/p CABG), h/o Cardiac arrest, CKD3, & metabolic s/o presents with CP ISO 10lb wt gain (CHF). Pharmacy consulted for initiation & mgmt of heparin gtt.  Date Time aPTT/HL Rate/Comment 5/22 0338 HL 0.50 Therapeutic x 1       Baseline Labs: aPTT - sent Hgb - 13.7 Plts - 181 Trop - 191 > __   Goal of Therapy:  Heparin level 0.3-0.7 units/ml Monitor platelets by anticoagulation protocol: Yes   Plan:   Continue heparin infusion at 1250 units/hr Recheck HL in 8 hrs to confirm Continue to monitor H&H and platelets daily while on heparin gtt.  Renda Rolls, PharmD, [facility] 03/29/2022 4:43 AM

## 2022-03-29 NOTE — Discharge Instructions (Signed)

## 2022-03-29 NOTE — Assessment & Plan Note (Addendum)
-  Likely due to demand ischemia from his acute CHF especially in the setting of stage IIIb chronic kidney disease. -Patient was started on IV heparin drug which has been discontinued by cardiology. Continue aspirin.

## 2022-03-30 DIAGNOSIS — I248 Other forms of acute ischemic heart disease: Secondary | ICD-10-CM | POA: Diagnosis not present

## 2022-03-30 DIAGNOSIS — I5043 Acute on chronic combined systolic (congestive) and diastolic (congestive) heart failure: Secondary | ICD-10-CM | POA: Diagnosis not present

## 2022-03-30 LAB — GLUCOSE, CAPILLARY
Glucose-Capillary: 61 mg/dL — ABNORMAL LOW (ref 70–99)
Glucose-Capillary: 100 mg/dL — ABNORMAL HIGH (ref 70–99)
Glucose-Capillary: 232 mg/dL — ABNORMAL HIGH (ref 70–99)
Glucose-Capillary: 133 mg/dL — ABNORMAL HIGH (ref 70–99)
Glucose-Capillary: 208 mg/dL — ABNORMAL HIGH (ref 70–99)

## 2022-03-30 LAB — BASIC METABOLIC PANEL
Anion gap: 9 (ref 5–15)
BUN: 23 mg/dL (ref 8–23)
CO2: 29 mmol/L (ref 22–32)
Calcium: 8.3 mg/dL — ABNORMAL LOW (ref 8.9–10.3)
Chloride: 105 mmol/L (ref 98–111)
Creatinine, Ser: 1.84 mg/dL — ABNORMAL HIGH (ref 0.61–1.24)
GFR, Estimated: 36 mL/min — ABNORMAL LOW (ref >60)
Glucose, Bld: 55 mg/dL — ABNORMAL LOW (ref 70–99)
Potassium: 3.3 mmol/L — ABNORMAL LOW (ref 3.5–5.1)
Sodium: 143 mmol/L (ref 135–145)

## 2022-03-30 NOTE — Evaluation (Signed)
Physical Therapy Evaluation Patient Details Name: [name] MRN: [id number] DOB: 03/10/1935 Today's Date: 03/30/2022  History of Present Illness  Pt admitted for acute on chronic CHF with complaints of SOB symptoms. History includes VF arrest, CAD, and HF. Elevated troponin, however cleared to participate.  Clinical Impression  Pt is a pleasant 87 year old male who was admitted for acute on chronic CHF. Pt demonstrates all bed mobility/transfers/ambulation at baseline level. Pt does feel initially unsteady with beginning ambulation with 1 LOB, however improved with further distance. Pt heavily encouraged to continue mobility efforts with RN staff to prevent deconditioning. Pt does not require any further PT needs at this time. Pt will be dc in house and does not require follow up. RN aware. Will dc current orders.      Recommendations for follow up therapy are one component of a multi-disciplinary discharge planning process, led by the attending physician.  Recommendations may be updated based on patient status, additional functional criteria and insurance authorization.  Follow Up Recommendations No PT follow up    Assistance Recommended at Discharge PRN  Patient can return home with the following       Equipment Recommendations None recommended by PT  Recommendations for Other Services       Functional Status Assessment Patient has not had a recent decline in their functional status     Precautions / Restrictions Precautions Precautions: Fall Restrictions Weight Bearing Restrictions: No      Mobility  Bed Mobility Overal bed mobility: Modified Independent             General bed mobility comments: safe technique with upright posture. No SOB symptoms with exertion    Transfers Overall transfer level: Modified independent Equipment used: None               General transfer comment: upright posture. No SOB symptoms noted. No AD     Ambulation/Gait Ambulation/Gait assistance: Min guard Gait Distance (Feet): 200 Feet Assistive device: None Gait Pattern/deviations: Step-through pattern       General Gait Details: initially unsteady with 1 LOB, improves with increased distance progressing to baseline gait pattern. Wide BOS with increased lateral leaning. No SOB symptoms with exertion  Stairs            Wheelchair Mobility    Modified Rankin (Stroke Patients Only)       Balance Overall balance assessment: Modified Independent                                           Pertinent Vitals/Pain Pain Assessment Pain Assessment: No/denies pain    Home Living Family/patient expects to be discharged to:: Private residence Living Arrangements: Spouse/significant other Available Help at Discharge: Family;Available 24 hours/day Type of Home: House Home Access: Stairs to enter Entrance Stairs-Rails: None (uses SPC to use for UE assist) Entrance Stairs-Number of Steps: 5   Home Layout: One level Home Equipment: Cane - single Barista (2 wheels)      Prior Function Prior Level of Function : Independent/Modified Independent             Mobility Comments: Uses SPC ADLs Comments: Independent with ADLs. Wife assists with IADLs     Hand Dominance        Extremity/Trunk Assessment   Upper Extremity Assessment Upper Extremity Assessment: Overall WFL for tasks assessed    Lower Extremity Assessment  Lower Extremity Assessment: Overall WFL for tasks assessed       Communication   Communication: HOH  Cognition Arousal/Alertness: Awake/alert Behavior During Therapy: WFL for tasks assessed/performed Overall Cognitive Status: Within Functional Limits for tasks assessed                                          General Comments      Exercises     Assessment/Plan    PT Assessment Patient does not need any further PT services  PT Problem List          PT Treatment Interventions      PT Goals (Current goals can be found in the Care Plan section)  Acute Rehab PT Goals Patient Stated Goal: to go home PT Goal Formulation: All assessment and education complete, DC therapy Time For Goal Achievement: 03/30/22 Potential to Achieve Goals: Good    Frequency       Co-evaluation               AM-PAC PT "6 Clicks" Mobility  Outcome Measure Help needed turning from your back to your side while in a flat bed without using bedrails?: None Help needed moving from lying on your back to sitting on the side of a flat bed without using bedrails?: None Help needed moving to and from a bed to a chair (including a wheelchair)?: None Help needed standing up from a chair using your arms (e.g., wheelchair or bedside chair)?: None Help needed to walk in hospital room?: A Little Help needed climbing 3-5 steps with a railing? : A Little 6 Click Score: 22    End of Session Equipment Utilized During Treatment: Gait belt Activity Tolerance: Patient tolerated treatment well Patient left: in chair;with call bell/phone within reach Nurse Communication: Mobility status PT Visit Diagnosis: Unsteadiness on feet (R26.81);Difficulty in walking, not elsewhere classified (R26.2)    Time: 1002-1016 PT Time Calculation (min) (ACUTE ONLY): 14 min   Charges:   PT Evaluation $PT Eval Low Complexity: 1 Low PT Treatments $Gait Training: 8-22 mins        Greggory Stallion, PT, DPT, GCS [phone]   [name] 03/30/2022, 1:59 PM

## 2022-03-30 NOTE — Hospital Course (Signed)
[name] is a 87 y.o. 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

## 2022-03-30 NOTE — Progress Notes (Signed)
  Progress Note   Patient: [name] DJS:[id number] DOB: 09/20/1935 DOA: 03/28/2022     2 DOS: the patient was seen and examined on 03/30/2022   Brief hospital course: [name] is a 87 y.o. 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  Assessment and Plan: * Acute on chronic combined systolic and diastolic CHF (congestive heart failure) ([place]) -Patient came in with increasing shortness of breath and weight gain more than 10 pounds. According to family he has been noncompliant with his medications and fluid intake. -continue diuresis with IV Lasix-- change  to. torsemide 40 mg BID per Dr. Fletcher Anon at discharge - continue Enteresto, Coreg as well as Imdur. -- Urine output more than 7 L. Patient feeling a lot better. Ambulated with physical therapy without any difficulty  --last 2D echo revealed an EF of 30 to 35% with grade 3 diastolic dysfunction and mild dilatation of the left mitral regurgitation.  Hypertensive urgency -  patient will be continued on his antihypertensives. -  on as needed IV labetalol. -- Blood pressure much improved  Elevated troponin -Likely due to demand ischemia from his acute CHF especially in the setting of stage IIIb chronic kidney disease. -Patient was started on IV heparin drug which has been discontinued by cardiology. Continue aspirin.  Type 2 diabetes mellitus with chronic kidney disease, with long-term current use of insulin ([place]) -  on supplement coverage with NovoLog. - l continue basal coverage.  Dyslipidemia -on  statin therapy.  Coronary artery  disease without angina pectoris -continue Imdur.    GERD without esophagitis -  continue PPI therapy.        Subjective: patient feels a lot better.  Physical Exam: decreased leg edema. Still has significant edema though. Respiratory clear to auscultation bilaterally. Cardiovascular about the heart sounds are normal greater than regular. Abdomen soft abdominal obesity. Vitals:   03/29/22 2342 03/30/22 0331 03/30/22 0813 03/30/22 1208  BP: 135/75 120/73 (!) 154/87 137/72  Pulse: 72 (!) 48 71 67  Resp: '17 18 17 17  '$ Temp: 98.1 F (36.7 C) 98 F (36.7 C) 98 F (36.7 C) 97.9 F (36.6 C)  TempSrc: Oral Oral    SpO2: 97% 94% 97% 96%  Weight:  106.4 kg    Height:        Family Communication: none today  Disposition: Status is: Inpatient Remains inpatient appropriate because: congestive heart failure with continuing diuresis. Anticipated discharge 1 to 2 days  Planned Discharge Destination: Home with Home Health DVT prophylaxis enoxaparin Time spent: 35 minutes  Author: Fritzi Mandes, MD 03/30/2022 3:22 PM

## 2022-03-30 NOTE — Progress Notes (Signed)
Noted blood sugar down to 61.  Patient had received his meal tray.  Encouraged patient to drink his apple juice and start eating his breakfast and we will recheck in 15 minutes

## 2022-03-30 NOTE — Progress Notes (Signed)
Inpatient Diabetes Program Recommendations  AACE/ADA: New Consensus Statement on Inpatient Glycemic Control (2015)  Target Ranges:  Prepandial:   less than 140 mg/dL      Peak postprandial:   less than 180 mg/dL (1-2 hours)      Critically ill patients:  140 - 180 mg/dL    Latest Reference Range & Units 03/28/22 21:04 03/28/22 21:54 03/29/22 09:10 03/29/22 12:46 03/29/22 17:55 03/29/22 20:21  Glucose-Capillary 70 - 99 mg/dL 68 (L) 111 (H) 196 (H)  2 units Novolog  179 (H)  2 units Novolog  169 (H)  2 units Novolog  185 (H)  2 units Novolog  15 units Levemir    Latest Reference Range & Units 03/30/22 08:12  Glucose-Capillary 70 - 99 mg/dL 61 (L)     Home DM Meds: Levemir 15 units QHS    Current Orders: Levemir 15 units QHS  Novolog 0-9 units TID AC + HS    MD- Note Hypoglycemia this AM (CBG 61) after getting 15 units Levemir last PM  Please consider reducing the Levemir to 10 units QHS    --Will follow patient during hospitalization--  Wyn Quaker RN, MSN, CDE Diabetes Coordinator Inpatient Glycemic Control Team Team Pager: [phone] (8a-5p)

## 2022-03-30 NOTE — Progress Notes (Signed)
Progress Note  Patient Name: [name] Date of Encounter: 03/30/2022  [facility] Cardiologist: [name], MD   Subjective   UOP -4.3L. Patient reports breathing is better. No chest pain.   Inpatient Medications    Scheduled Meds:  aspirin EC  81 mg Oral Daily   carvedilol  3.125 mg Oral BID WC   clopidogrel  75 mg Oral Daily   enoxaparin (LOVENOX) injection  0.5 mg/kg Subcutaneous Q24H   furosemide  80 mg Intravenous BID   hydrALAZINE  25 mg Oral q12n4p   insulin aspart  0-9 Units Subcutaneous TID AC & HS   insulin detemir  15 Units Subcutaneous QHS   isosorbide mononitrate  30 mg Oral Daily   magnesium oxide  400 mg Oral Daily   metolazone  2.5 mg Oral Daily   multivitamin with minerals  1 tablet Oral Daily   pantoprazole  40 mg Oral BID   potassium chloride SA  20 mEq Oral Daily   rosuvastatin  40 mg Oral Daily   sacubitril-valsartan  1 tablet Oral BID   sodium chloride flush  3 mL Intravenous Q12H   Continuous Infusions:  sodium chloride     PRN Meds: sodium chloride, acetaminophen, albuterol, ALPRAZolam, ondansetron (ZOFRAN) IV, sodium chloride flush   Vital Signs    Vitals:   03/29/22 1922 03/29/22 2342 03/30/22 0331 03/30/22 0813  BP: (!) 124/58 135/75 120/73 (!) 154/87  Pulse: 67 72 (!) 48 71  Resp: '20 17 18 17  '$ Temp:  98.1 F (36.7 C) 98 F (36.7 C) 98 F (36.7 C)  TempSrc:  Oral Oral   SpO2: 97% 97% 94% 97%  Weight:   106.4 kg   Height:        Intake/Output Summary (Last 24 hours) at 03/30/2022 0904 Last data filed at 03/29/2022 2342 Gross per 24 hour  Intake --  Output 4350 ml  Net -4350 ml      03/30/2022    3:31 AM 03/29/2022    4:26 AM 03/28/2022    5:17 PM  Last 3 Weights  Weight (lbs) 234 lb 8 oz 252 lb 6.8 oz 236 lb  Weight (kg) 106.369 kg 114.5 kg 107.049 kg      Telemetry    AV paced rhythm, PVC - Personally Reviewed  ECG    No new - Personally Reviewed  Physical Exam   GEN: No acute distress.   Neck:  minimal JVD Cardiac: RRR, no murmurs, rubs, or gallops.  Respiratory: diffusely diminished GI: Soft, nontender, non-distended  MS: 1+ lower edema; No deformity. Neuro:  Nonfocal  Psych: Normal affect   Labs    High Sensitivity Troponin:   Recent Labs  Lab 03/24/22 1505 03/24/22 1742 03/28/22 1713 03/28/22 1913  TROPONINIHS 24* 25* 191* 212*     Chemistry Recent Labs  Lab 03/28/22 1713 03/29/22 0338 03/30/22 0437  NA 143 144 143  K 3.9 3.9 3.3*  CL 115* 110 105  CO2 '24 27 29  '$ GLUCOSE 86 116* 55*  BUN 24* 23 23  CREATININE 1.56* 1.73* 1.84*  CALCIUM 8.1* 8.4* 8.3*  PROT 5.7*  --   --   ALBUMIN 2.9*  --   --   AST 21  --   --   ALT 18  --   --   ALKPHOS 63  --   --   BILITOT 0.8  --   --   GFRNONAA 44* 38* 36*  ANIONGAP 4* 7 9  Lipids No results for input(s): CHOL, TRIG, HDL, LABVLDL, LDLCALC, CHOLHDL in the last 168 hours.  Hematology Recent Labs  Lab 03/24/22 1505 03/28/22 1713 03/29/22 0338  WBC 6.7 5.4 5.6  RBC 5.41 5.32 5.25  HGB 13.8 13.7 13.3  HCT 45.5 45.3 44.3  MCV 84.1 85.2 84.4  MCH 25.5* 25.8* 25.3*  MCHC 30.3 30.2 30.0  RDW 17.5* 17.1* 17.2*  PLT 185 181 147*   Thyroid No results for input(s): TSH, FREET4 in the last 168 hours.  BNP Recent Labs  Lab 03/24/22 1505 03/28/22 1713  BNP 2,903.5* 3,468.6*    DDimer No results for input(s): DDIMER in the last 168 hours.   Radiology    DG Chest 1 View  Result Date: 03/28/2022 CLINICAL DATA:  Shortness of breath EXAM: CHEST  1 VIEW COMPARISON:  03/24/2022 FINDINGS: Cardiomegaly. Left pacer remains in place, unchanged. Prior CABG. Mild vascular congestion. Increased densities in the lung bases could reflect atelectasis or infiltrates. Calcified subscapular mass in the right posterior chest wall is again noted and unchanged as seen on prior CT. IMPRESSION: Cardiomegaly, vascular congestion. Increasing bibasilar atelectasis or infiltrates. Electronically Signed   By: Rolm Baptise M.D.   On:  03/28/2022 18:02    Cardiac Studies   Echo 12/2021   1. Left ventricular ejection fraction, by estimation, is 30 to 35%. The  left ventricle has moderately decreased function. The left ventricle  demonstrates global hypokinesis. There is mild left ventricular  hypertrophy. Left ventricular diastolic  parameters are consistent with Grade III diastolic dysfunction  (restrictive). Elevated left atrial pressure.   2. Right ventricular systolic function is moderately reduced. The right  ventricular size is mildly enlarged. Mildly increased right ventricular  wall thickness. Tricuspid regurgitation signal is inadequate for assessing  PA pressure.   3. Left atrial size was mildly dilated.   4. The mitral valve is degenerative. Mild mitral valve regurgitation. No  evidence of mitral stenosis.   5. The aortic valve is tricuspid. There is moderate calcification of the  aortic valve. There is mild thickening of the aortic valve. Aortic valve  regurgitation is not visualized. Aortic valve sclerosis/calcification is  present, without any evidence of  aortic stenosis.    Cardiac cath 08/2021 Conclusion   1.  Severe underlying three-vessel coronary artery disease with patent grafts including LIMA to LAD, SVG to large diagonal, sequential SVG to OM /distal left circumflex and SVG to right PDA. 2.  Right heart catheterization showed normal right and left-sided filling pressures, minimal pulmonary hypertension and normal cardiac output.   Recommendations: Continue medical therapy for coronary artery disease and congestive heart failure. Volume status appears optimal at this time.     Echo 08/2021  1. Left ventricular ejection fraction, by estimation, is 35 to 40%. The  left ventricle has moderately decreased function. The left ventricle  demonstrates global hypokinesis. Concern for anterior, anteroseptal and  apical hypokinesis. The left ventricular   internal cavity size was mildly dilated.  Left ventricular diastolic  parameters are indeterminate.   2. Right ventricular systolic function is grossly moderately reduced  though difficult to visualize. The right ventricular size is mildly  enlarged.   3. Left atrial size was mildly dilated.   4. The mitral valve is normal in structure. No evidence of mitral valve  regurgitation. No evidence of mitral stenosis.   5. The aortic valve is normal in structure. Aortic valve regurgitation is  not visualized. No aortic stenosis is present.   6. The  inferior vena cava is dilated in size with <50% respiratory  variability, suggesting right atrial pressure of 15 mmHg.   7. Bradycardia noted   8. Challenging images    Echo 11/2020  1. Challenging images   2. Left ventricular ejection fraction, by estimation, is 55 %. The left  ventricle has normal function. The left ventricle has no regional wall  motion abnormalities. There is mild left ventricular hypertrophy. Left  ventricular diastolic parameters are  consistent with Grade II diastolic dysfunction (pseudonormalization).   3. Right ventricular systolic function is normal. The right ventricular  size is normal. Tricuspid regurgitation signal is inadequate for assessing  PA pressure.   Patient Profile     87 y.o. 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 BNP elevated >3000 with HS trop moderately elevated. CXR with pulm vasc congestion s/p IV laisx '80mg'$  in the ED - PTA lasix '80mg'$  daily, metolazone 2.'5mg'$  once weekly - PTA entresto, Imdur, hydralzine, and coreg continued on admission - IV lasix '80mg'$  BID - UOP -4L since admission - kidney function is relatively  stable - Volume status is improving - plan for torsemide at d/c - continue daily weights, strict I/Is and monitor kidney function with diuresis   Hypertensive urgency - in the setting of volume overload - PTA meds continued - IV lasix - BP is better   Elevated troponin CAD s/p CABG x 5 in October 2021 - HS trop 950>932 - no chest pain reported - cath in October 2022 showed patent grafts - continue PTA Plavix, BB, ARB, nitrate and statin therapy - IV heparin discontinued - started on Aspirin '81mg'$  daily - suspect demand ischemic in the setting of acute heart failure.   CKD stage 3 - relatively stable - baseline around 1.5   Trifasicular block/intermittent CHB s/p CRT-P - implantation on October 12, 2021 - followed by EP  For questions or updates, please contact [facility] Please consult [email or web address] for contact info under        Signed, Sheza Strickland Ninfa Meeker, PA-C  03/30/2022, 9:04 AM

## 2022-03-30 NOTE — Progress Notes (Addendum)
   Heart Failure Nurse Navigator Note  Met with patient today, he was sitting up in the chair at bedside.  There were no family members at bedside and he states that his wife was coming into visit this evening.  He states that he had just been working with physical therapy and ambulated all the way around the nurses cubicle without difficulty.  Discussed being compliant with his medications once he returns home.  He states that his wife sets them up weekly  pillbox.  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  Also discussed taking his second water pill earlier in the afternoon so he is not up all night having to urinate.  Again discussed his fluid restriction.  He states at one time he was told that he could drink as much green tea as wanted.  Made aware that is also a liquid and is included in his restriction.  He was given a scale as he felt that his was inaccurate and notes  at doctors visits to that effect where patient was being weighed 221# at home.It was noted at the visit  was weighing at 233#.  Went over the weight chart and also using the zone magnet for documentation.  Suggested that he bring those weight charts to his doctor's appointment and also the heart failure clinic appointment.  By teach back method went over what should be reported with weight gains.  Stressed the importance of the daily weights, reporting changes in symptoms, sticking with the fluid restriction and taking his medications is very important factor in keeping himself out of the hospital.  He had no further questions.  Pricilla Riffle RN [facility]

## 2022-03-31 ENCOUNTER — Ambulatory Visit: Payer: Medicare HMO | Admitting: Family

## 2022-03-31 DIAGNOSIS — I5043 Acute on chronic combined systolic (congestive) and diastolic (congestive) heart failure: Secondary | ICD-10-CM | POA: Diagnosis not present

## 2022-03-31 DIAGNOSIS — I2510 Atherosclerotic heart disease of native coronary artery without angina pectoris: Secondary | ICD-10-CM | POA: Diagnosis not present

## 2022-03-31 LAB — BASIC METABOLIC PANEL
Anion gap: 9 (ref 5–15)
BUN: 25 mg/dL — ABNORMAL HIGH (ref 8–23)
CO2: 32 mmol/L (ref 22–32)
Calcium: 8.6 mg/dL — ABNORMAL LOW (ref 8.9–10.3)
Chloride: 99 mmol/L (ref 98–111)
Creatinine, Ser: 1.88 mg/dL — ABNORMAL HIGH (ref 0.61–1.24)
GFR, Estimated: 35 mL/min — ABNORMAL LOW (ref >60)
Glucose, Bld: 109 mg/dL — ABNORMAL HIGH (ref 70–99)
Potassium: 3.4 mmol/L — ABNORMAL LOW (ref 3.5–5.1)
Sodium: 140 mmol/L (ref 135–145)

## 2022-03-31 LAB — GLUCOSE, CAPILLARY
Glucose-Capillary: 96 mg/dL (ref 70–99)
Glucose-Capillary: 286 mg/dL — ABNORMAL HIGH (ref 70–99)
Glucose-Capillary: 151 mg/dL — ABNORMAL HIGH (ref 70–99)
Glucose-Capillary: 162 mg/dL — ABNORMAL HIGH (ref 70–99)

## 2022-03-31 MED ORDER — TORSEMIDE 20 MG PO TABS
40.0000 mg | ORAL_TABLET | Freq: Two times a day (BID) | ORAL | Status: DC
  Administered 2022-03-31 – 2022-04-01 (×2): 40 mg via ORAL
  Filled 2022-03-31 (×2): qty 2

## 2022-03-31 MED ORDER — POTASSIUM CHLORIDE CRYS ER 20 MEQ PO TBCR
40.0000 meq | EXTENDED_RELEASE_TABLET | Freq: Once | ORAL | Status: AC
  Administered 2022-03-31: 40 meq via ORAL
  Filled 2022-03-31: qty 2

## 2022-03-31 NOTE — Progress Notes (Signed)
PROGRESS NOTE    [name]  IFO:[id number] DOB: 1935/09/24 DOA: 03/28/2022 PCP: Tomasita Morrow, MD    Brief Narrative:  [name] is a 87 y.o. 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    Consultants:  cardiology  Procedures:   Antimicrobials:      Subjective: No sob, or cp. Feels better  Objective: Vitals:   03/31/22 0346 03/31/22 0400 03/31/22 0730 03/31/22 1150  BP: 132/74  122/66 116/68  Pulse: (!) 45  67 (!) 101  Resp: '18  17 17  '$ Temp: 98.2 F (36.8 C)  98.2 F (36.8 C) 98 F (36.7 C)  TempSrc:      SpO2: 98%  95% 96%  Weight:  105.6 kg    Height:        Intake/Output Summary (Last 24 hours) at 03/31/2022 1526 Last data filed at 03/31/2022 1514 Gross per 24 hour  Intake 360 ml  Output 4425 ml  Net -4065 ml   Filed Weights   03/29/22 0426 03/30/22 0331 03/31/22 0400  Weight: 114.5 kg 106.4 kg 105.6 kg    Examination: Calm, NAD Cta no w/r Reg s1/s2 no gallop Soft benign +bs No edema Awake and alert Mood and affect appropriate in current setting     Data Reviewed: I have personally reviewed following labs and imaging studies  CBC: Recent Labs  Lab 03/28/22 1713 03/29/22 0338  WBC 5.4 5.6  NEUTROABS 3.7  --   HGB 13.7 13.3  HCT 45.3 44.3  MCV 85.2 84.4  PLT 181 676*   Basic Metabolic Panel: Recent Labs  Lab 03/28/22 1713 03/29/22 0338 03/30/22 0437 03/31/22 0616  NA 143 144 143 140  K 3.9 3.9 3.3* 3.4*  CL 115* 110 105 99  CO2 '24 27 29 '$ 32  GLUCOSE 86 116* 55* 109*  BUN 24* 23 23 25*  CREATININE 1.56* 1.73* 1.84*  1.88*  CALCIUM 8.1* 8.4* 8.3* 8.6*   GFR: Estimated Creatinine Clearance: 34.5 mL/min (A) (by C-G formula based on SCr of 1.88 mg/dL (H)). Liver Function Tests: Recent Labs  Lab 03/28/22 1713  AST 21  ALT 18  ALKPHOS 63  BILITOT 0.8  PROT 5.7*  ALBUMIN 2.9*   No results for input(s): LIPASE, AMYLASE in the last 168 hours. No results for input(s): AMMONIA in the last 168 hours. Coagulation Profile: Recent Labs  Lab 03/28/22 1913  INR 1.1   Cardiac Enzymes: No results for input(s): CKTOTAL, CKMB, CKMBINDEX, TROPONINI in the last 168 hours. BNP (last 3 results) No results for input(s): PROBNP in the last 8760 hours. HbA1C: No results for input(s): HGBA1C in the last 72 hours. CBG: Recent Labs  Lab 03/30/22 1206 03/30/22 1648 03/30/22 2045 03/31/22 0730 03/31/22 1314  GLUCAP 232* 133* 208* 96 286*   Lipid Profile: No results for input(s): CHOL, HDL, LDLCALC, TRIG, CHOLHDL, LDLDIRECT in the last 72 hours. Thyroid Function Tests: No results for input(s): TSH, T4TOTAL, FREET4, T3FREE, THYROIDAB in the last 72 hours. Anemia Panel: No results for input(s): VITAMINB12, FOLATE, FERRITIN, TIBC, IRON, RETICCTPCT in the last 72 hours. Sepsis Labs: No results for input(s): PROCALCITON, LATICACIDVEN in the last 168  hours.  Recent Results (from the past 240 hour(s))  Resp Panel by RT-PCR (Flu A&B, Covid) Nasopharyngeal Swab     Status: None   Collection Time: 03/28/22  5:48 PM   Specimen: Nasopharyngeal Swab; Nasopharyngeal(NP) swabs in vial transport medium  Result Value Ref Range Status   SARS Coronavirus 2 by RT PCR NEGATIVE NEGATIVE Final    Comment: (NOTE) SARS-CoV-2 target nucleic acids are NOT DETECTED.  The SARS-CoV-2 RNA is generally detectable in upper respiratory specimens during the acute phase of infection. The lowest concentration of SARS-CoV-2 viral copies this assay can detect is 138 copies/mL. A negative result does not preclude SARS-Cov-2 infection and  should not be used as the sole basis for treatment or other patient management decisions. A negative result may occur with  improper specimen collection/handling, submission of specimen other than nasopharyngeal swab, presence of viral mutation(s) within the areas targeted by this assay, and inadequate number of viral copies(<138 copies/mL). A negative result must be combined with clinical observations, patient history, and epidemiological information. The expected result is Negative.  Fact Sheet for Patients:  EntrepreneurPulse.com.au  Fact Sheet for Healthcare Providers:  IncredibleEmployment.be  This test is no t yet approved or cleared by the [place] FDA and  has been authorized for detection and/or diagnosis of SARS-CoV-2 by FDA under an Emergency Use Authorization (EUA). This EUA will remain  in effect (meaning this test can be used) for the duration of the COVID-19 declaration under Section 564(b)(1) of the Act, 21 U.S.C.section 360bbb-3(b)(1), unless the authorization is terminated  or revoked sooner.       Influenza A by PCR NEGATIVE NEGATIVE Final   Influenza B by PCR NEGATIVE NEGATIVE Final    Comment: (NOTE) The Xpert Xpress SARS-CoV-2/FLU/RSV plus assay is intended as an aid in the diagnosis of influenza from Nasopharyngeal swab specimens and should not be used as a sole basis for treatment. Nasal washings and aspirates are unacceptable for Xpert Xpress SARS-CoV-2/FLU/RSV testing.  Fact Sheet for Patients: EntrepreneurPulse.com.au  Fact Sheet for Healthcare Providers: IncredibleEmployment.be  This test is not yet approved or cleared by the [place] FDA and has been authorized for detection and/or diagnosis of SARS-CoV-2 by FDA under an Emergency Use Authorization (EUA). This EUA will remain in effect (meaning this test can be used) for the duration of the COVID-19 declaration  under Section 564(b)(1) of the Act, 21 U.S.C. section 360bbb-3(b)(1), unless the authorization is terminated or revoked.  Performed at [facility], [address], [place], [place] [address]          Radiology Studies: No results found.      Scheduled Meds:  aspirin EC  81 mg Oral Daily   carvedilol  3.125 mg Oral BID WC   clopidogrel  75 mg Oral Daily   enoxaparin (LOVENOX) injection  0.5 mg/kg Subcutaneous Q24H   hydrALAZINE  25 mg Oral q12n4p   insulin aspart  0-9 Units Subcutaneous TID AC & HS   insulin detemir  15 Units Subcutaneous QHS   isosorbide mononitrate  30 mg Oral Daily   magnesium oxide  400 mg Oral Daily   metolazone  2.5 mg Oral Daily   multivitamin with minerals  1 tablet Oral Daily   pantoprazole  40 mg Oral BID   potassium chloride SA  20 mEq Oral Daily   rosuvastatin  40 mg Oral Daily   sacubitril-valsartan  1 tablet Oral BID   sodium chloride flush  3 mL Intravenous Q12H  torsemide  40 mg Oral BID   Continuous Infusions:  sodium chloride      Assessment & Plan:   Principal Problem:   Acute on chronic combined systolic and diastolic CHF (congestive heart failure) (HCC) Active Problems:   Hypertensive urgency   Type 2 diabetes mellitus with chronic kidney disease, with long-term current use of insulin (HCC)   Elevated troponin   Dyslipidemia   Coronary artery disease without angina pectoris   GERD without esophagitis   Acute on chronic combined systolic and diastolic CHF (congestive heart failure) ([place]) Clinically improving. EF 18-98% Grade 3 diastolic dysfunction Cards following Good UO On iv lasix stopped today by cards Start po torsemide  '40mg'$  bid daily, continue metolazone 2.5 mg  Cotninue coreg, hydralzine, isosorbide, entresto Possible dc tomorrow if all stable       Hypertensive urgency Improved and stable    Elevated troponin -Likely due to demand ischemia from his acute CHF especially in the setting of  stage IIIb chronic kidney disease. 5/24 no cp.  Heparin dc/d   Type 2 diabetes mellitus with chronic kidney disease, with long-term current use of insulin (HCC) Continue riss   Dyslipidemia -on  statin therapy.   Coronary artery disease without angina pectoris -continue Imdur.     GERD without esophagitis -  continue PPI therapy.   DVT prophylaxis: Lovenox Code Status: Full Family Communication: None at bedside Disposition Plan:  Status is: Inpatient Remains inpatient appropriate because: IV treatment        LOS: 3 days   Time spent: 35 minutes    Nolberto Hanlon, MD [facility] Pager [phone]  If 7PM-7AM, please contact night-coverage 03/31/2022, 3:26 PM

## 2022-03-31 NOTE — Progress Notes (Signed)
Progress Note  Patient Name: [name] Date of Encounter: 03/31/2022  [facility] Cardiologist: [name], MD   Subjective   UOP -4.1L. kidney function stable. BP soft overnight. Patient reports breathing is improving. No chest pain.   Inpatient Medications    Scheduled Meds:  aspirin EC  81 mg Oral Daily   carvedilol  3.125 mg Oral BID WC   clopidogrel  75 mg Oral Daily   enoxaparin (LOVENOX) injection  0.5 mg/kg Subcutaneous Q24H   furosemide  80 mg Intravenous BID   hydrALAZINE  25 mg Oral q12n4p   insulin aspart  0-9 Units Subcutaneous TID AC & HS   insulin detemir  15 Units Subcutaneous QHS   isosorbide mononitrate  30 mg Oral Daily   magnesium oxide  400 mg Oral Daily   metolazone  2.5 mg Oral Daily   multivitamin with minerals  1 tablet Oral Daily   pantoprazole  40 mg Oral BID   potassium chloride SA  20 mEq Oral Daily   rosuvastatin  40 mg Oral Daily   sacubitril-valsartan  1 tablet Oral BID   sodium chloride flush  3 mL Intravenous Q12H   Continuous Infusions:  sodium chloride     PRN Meds: sodium chloride, acetaminophen, albuterol, ALPRAZolam, ondansetron (ZOFRAN) IV, sodium chloride flush   Vital Signs    Vitals:   03/30/22 2322 03/31/22 0346 03/31/22 0400 03/31/22 0730  BP: 92/63 132/74  122/66  Pulse: 65 (!) 45  67  Resp: '15 18  17  '$ Temp: 98 F (36.7 C) 98.2 F (36.8 C)  98.2 F (36.8 C)  TempSrc:      SpO2: 98% 98%  95%  Weight:   105.6 kg   Height:        Intake/Output Summary (Last 24 hours) at 03/31/2022 0847 Last data filed at 03/31/2022 0406 Gross per 24 hour  Intake 240 ml  Output 4350 ml  Net -4110 ml      03/31/2022    4:00 AM 03/30/2022    3:31 AM 03/29/2022    4:26 AM  Last 3 Weights  Weight (lbs) 232 lb 12.9 oz 234 lb 8 oz 252 lb 6.8 oz  Weight (kg) 105.6 kg 106.369 kg 114.5 kg      Telemetry    AV paced rhythm, PVCs, HR 70s - Personally Reviewed  ECG    No new - Personally Reviewed  Physical Exam    GEN: No acute distress.   Neck: minimal JVD Cardiac: RRR, no murmurs, rubs, or gallops.  Respiratory: wheezing, diminished breath sounds. GI: Soft, nontender, non-distended  MS: 1+ lower leg edema; No deformity. Neuro:  Nonfocal  Psych: Normal affect   Labs    High Sensitivity Troponin:   Recent Labs  Lab 03/24/22 1505 03/24/22 1742 03/28/22 1713 03/28/22 1913  TROPONINIHS 24* 25* 191* 212*     Chemistry Recent Labs  Lab 03/28/22 1713 03/29/22 0338 03/30/22 0437 03/31/22 0616  NA 143 144 143 140  K 3.9 3.9 3.3* 3.4*  CL 115* 110 105 99  CO2 '24 27 29 '$ 32  GLUCOSE 86 116* 55* 109*  BUN 24* 23 23 25*  CREATININE 1.56* 1.73* 1.84* 1.88*  CALCIUM 8.1* 8.4* 8.3* 8.6*  PROT 5.7*  --   --   --   ALBUMIN 2.9*  --   --   --   AST 21  --   --   --   ALT 18  --   --   --  ALKPHOS 63  --   --   --   BILITOT 0.8  --   --   --   GFRNONAA 44* 38* 36* 35*  ANIONGAP 4* '7 9 9    '$ Lipids No results for input(s): CHOL, TRIG, HDL, LABVLDL, LDLCALC, CHOLHDL in the last 168 hours.  Hematology Recent Labs  Lab 03/24/22 1505 03/28/22 1713 03/29/22 0338  WBC 6.7 5.4 5.6  RBC 5.41 5.32 5.25  HGB 13.8 13.7 13.3  HCT 45.5 45.3 44.3  MCV 84.1 85.2 84.4  MCH 25.5* 25.8* 25.3*  MCHC 30.3 30.2 30.0  RDW 17.5* 17.1* 17.2*  PLT 185 181 147*   Thyroid No results for input(s): TSH, FREET4 in the last 168 hours.  BNP Recent Labs  Lab 03/24/22 1505 03/28/22 1713  BNP 2,903.5* 3,468.6*    DDimer No results for input(s): DDIMER in the last 168 hours.   Radiology    No results found.  Cardiac Studies   Echo 12/2021   1. Left ventricular ejection fraction, by estimation, is 30 to 35%. The  left ventricle has moderately decreased function. The left ventricle  demonstrates global hypokinesis. There is mild left ventricular  hypertrophy. Left ventricular diastolic  parameters are consistent with Grade III diastolic dysfunction  (restrictive). Elevated left atrial pressure.    2. Right ventricular systolic function is moderately reduced. The right  ventricular size is mildly enlarged. Mildly increased right ventricular  wall thickness. Tricuspid regurgitation signal is inadequate for assessing  PA pressure.   3. Left atrial size was mildly dilated.   4. The mitral valve is degenerative. Mild mitral valve regurgitation. No  evidence of mitral stenosis.   5. The aortic valve is tricuspid. There is moderate calcification of the  aortic valve. There is mild thickening of the aortic valve. Aortic valve  regurgitation is not visualized. Aortic valve sclerosis/calcification is  present, without any evidence of  aortic stenosis.    Cardiac cath 08/2021 Conclusion   1.  Severe underlying three-vessel coronary artery disease with patent grafts including LIMA to LAD, SVG to large diagonal, sequential SVG to OM /distal left circumflex and SVG to right PDA. 2.  Right heart catheterization showed normal right and left-sided filling pressures, minimal pulmonary hypertension and normal cardiac output.   Recommendations: Continue medical therapy for coronary artery disease and congestive heart failure. Volume status appears optimal at this time.     Echo 08/2021  1. Left ventricular ejection fraction, by estimation, is 35 to 40%. The  left ventricle has moderately decreased function. The left ventricle  demonstrates global hypokinesis. Concern for anterior, anteroseptal and  apical hypokinesis. The left ventricular   internal cavity size was mildly dilated. Left ventricular diastolic  parameters are indeterminate.   2. Right ventricular systolic function is grossly moderately reduced  though difficult to visualize. The right ventricular size is mildly  enlarged.   3. Left atrial size was mildly dilated.   4. The mitral valve is normal in structure. No evidence of mitral valve  regurgitation. No evidence of mitral stenosis.   5. The aortic valve is normal in structure.  Aortic valve regurgitation is  not visualized. No aortic stenosis is present.   6. The inferior vena cava is dilated in size with <50% respiratory  variability, suggesting right atrial pressure of 15 mmHg.   7. Bradycardia noted   8. Challenging images    Echo 11/2020  1. Challenging images   2. Left ventricular ejection fraction, by estimation, is 55 %.  The left  ventricle has normal function. The left ventricle has no regional wall  motion abnormalities. There is mild left ventricular hypertrophy. Left  ventricular diastolic parameters are  consistent with Grade II diastolic dysfunction (pseudonormalization).   3. Right ventricular systolic function is normal. The right ventricular  size is normal. Tricuspid regurgitation signal is inadequate for assessing  PA pressure.     Patient Profile     87 y.o. 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 BNP elevated >3000 with HS trop moderately elevated. CXR with pulm vasc congestion s/p IV laisx '80mg'$  in the ED - PTA lasix '80mg'$  daily, metolazone 2.'5mg'$  once weekly - PTA entresto, Imdur, hydralzine, and coreg continued on admission - IV lasix '80mg'$  BID - UOP -8.1L since admission - kidney function is relatively stable - Volume status is improving - plan for torsemide at d/c - continue daily weights, strict I/Is and monitor kidney function with diuresis, may not require much more IV diuresis   Hypertensive urgency - in the setting of volume overload - PTA meds continued - IV lasix - BP is good   Elevated troponin CAD s/p CABG x 5 in October 2021 - HS trop 161>096 - no chest pain reported - cath in October 2022  showed patent grafts - continue Aspirin, Plavix, BB, ARB, nitrate and statin therapy - IV heparin discontinued - suspect demand ischemic in the setting of acute heart failure.   CKD stage 3 - relatively stable - baseline around 1.5   Trifasicular block/intermittent CHB s/p CRT-P - implantation on October 12, 2021 - followed by EP  For questions or updates, please contact [facility] Please consult [email or web address] for contact info under        Signed, Dajohn Ellender Ninfa Meeker, PA-C  03/31/2022, 8:47 AM

## 2022-03-31 NOTE — Progress Notes (Signed)
Mobility Specialist - Progress Note   03/31/22 1159  Mobility  Activity Ambulated with assistance in hallway;Transferred from bed to chair  Level of Assistance Standby assist, set-up cues, supervision of patient - no hands on  Assistive Device None  Distance Ambulated (ft) 180 ft  Activity Response Tolerated well  $Mobility charge 1 Mobility    During mobility: 97 HR Post-mobility: 84 HR, 97% SpO2   Pt lying in bed upon arrival, utilizing RA. Pt able to complete bed mobility modI with use of bed rails. Ambulated hallway with minG and slightly increased speed. No complaints. Pt returned to recliner with needs in reach.    Kathee Delton Mobility Specialist 03/31/22, 12:10 PM

## 2022-03-31 NOTE — Care Management Important Message (Signed)
Important Message  Patient Details  Name: [name] MRN: [id number] Date of Birth: Jun 07, 1935   Medicare Important Message Given:  Yes     Dannette Barbara 03/31/2022, 11:15 AM

## 2022-04-01 ENCOUNTER — Other Ambulatory Visit (HOSPITAL_COMMUNITY): Payer: Self-pay

## 2022-04-01 DIAGNOSIS — R778 Other specified abnormalities of plasma proteins: Secondary | ICD-10-CM | POA: Diagnosis not present

## 2022-04-01 DIAGNOSIS — E785 Hyperlipidemia, unspecified: Secondary | ICD-10-CM | POA: Diagnosis not present

## 2022-04-01 DIAGNOSIS — N1832 Chronic kidney disease, stage 3b: Secondary | ICD-10-CM

## 2022-04-01 DIAGNOSIS — I5043 Acute on chronic combined systolic (congestive) and diastolic (congestive) heart failure: Secondary | ICD-10-CM | POA: Diagnosis not present

## 2022-04-01 DIAGNOSIS — I2510 Atherosclerotic heart disease of native coronary artery without angina pectoris: Secondary | ICD-10-CM | POA: Diagnosis not present

## 2022-04-01 LAB — BASIC METABOLIC PANEL
Anion gap: 7 (ref 5–15)
BUN: 25 mg/dL — ABNORMAL HIGH (ref 8–23)
CO2: 34 mmol/L — ABNORMAL HIGH (ref 22–32)
Calcium: 8.5 mg/dL — ABNORMAL LOW (ref 8.9–10.3)
Chloride: 96 mmol/L — ABNORMAL LOW (ref 98–111)
Creatinine, Ser: 1.89 mg/dL — ABNORMAL HIGH (ref 0.61–1.24)
GFR, Estimated: 35 mL/min — ABNORMAL LOW (ref >60)
Glucose, Bld: 128 mg/dL — ABNORMAL HIGH (ref 70–99)
Potassium: 3.4 mmol/L — ABNORMAL LOW (ref 3.5–5.1)
Sodium: 137 mmol/L (ref 135–145)

## 2022-04-01 LAB — CBC
HCT: 46.8 % (ref 39.0–52.0)
Hemoglobin: 14.5 g/dL (ref 13.0–17.0)
MCH: 25.3 pg — ABNORMAL LOW (ref 26.0–34.0)
MCHC: 31 g/dL (ref 30.0–36.0)
MCV: 81.8 fL (ref 80.0–100.0)
Platelets: 172 10*3/uL (ref 150–400)
RBC: 5.72 MIL/uL (ref 4.22–5.81)
RDW: 16.9 % — ABNORMAL HIGH (ref 11.5–15.5)
WBC: 4.7 10*3/uL (ref 4.0–10.5)
nRBC: 0 % (ref 0.0–0.2)

## 2022-04-01 LAB — GLUCOSE, CAPILLARY
Glucose-Capillary: 116 mg/dL — ABNORMAL HIGH (ref 70–99)
Glucose-Capillary: 281 mg/dL — ABNORMAL HIGH (ref 70–99)

## 2022-04-01 MED ORDER — DAPAGLIFLOZIN PROPANEDIOL 10 MG PO TABS
10.0000 mg | ORAL_TABLET | Freq: Every day | ORAL | 0 refills | Status: AC
Start: 2022-04-01 — End: 2022-05-01

## 2022-04-01 MED ORDER — TORSEMIDE 40 MG PO TABS: 40.0000 mg | ORAL_TABLET | Freq: Two times a day (BID) | ORAL | 0 refills | Status: DC

## 2022-04-01 MED ORDER — METOLAZONE 2.5 MG PO TABS: 2.5000 mg | ORAL_TABLET | ORAL | 0 refills | Status: DC

## 2022-04-01 MED ORDER — DAPAGLIFLOZIN PROPANEDIOL 10 MG PO TABS
10.0000 mg | ORAL_TABLET | Freq: Every day | ORAL | Status: DC
  Administered 2022-04-01: 10 mg via ORAL
  Filled 2022-04-01: qty 1

## 2022-04-01 MED ORDER — ASPIRIN 81 MG PO TBEC: 81.0000 mg | DELAYED_RELEASE_TABLET | Freq: Every day | ORAL | 0 refills | Status: AC

## 2022-04-01 MED ORDER — POTASSIUM CHLORIDE CRYS ER 20 MEQ PO TBCR
20.0000 meq | EXTENDED_RELEASE_TABLET | Freq: Once | ORAL | Status: AC
  Administered 2022-04-01: 20 meq via ORAL
  Filled 2022-04-01: qty 1

## 2022-04-01 NOTE — Discharge Summary (Signed)
[name] STM:[id number] DOB: [birth year] DOA: 03/28/2022  PCP: Tomasita Morrow, MD  Admit date: 03/28/2022 Discharge date: 04/01/2022  Admitted From: Home Disposition:  home  Recommendations for Outpatient Follow-up:  Follow up with PCP in 1 week Please obtain BMP/CBC in one week Please follow up CHF clinic within 1 week Follow-up with cardiology in 1 week     Discharge Condition:Stable CODE STATUS: Full Diet recommendation: Heart Healthy / Carb Modified  Brief/Interim Summary: Per GXQ:JJHER [name] is a 87 y.o. 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  He admits to dyspnea on exertion as well as orthopnea with occasional PND. ED Course: When he came to the ER, BP was 163/104 with respiratory rate of 30 and pulse oximetry  96% on room air.  Labs revealed a BUN of 24 and creatinine 1.56 better than previous levels with stage IIIb CKD, to obtain 5.7 albumin 2.9 and BNP was significantly elevated at 3468 compared to 29/3.5 earlier.  I sensitive troponin I was 191 and later 212 CBC was within normal.  Coagulation profile was normal. EKG as reviewed by me : Pending Imaging: Chest x-ray showed vascular congestion and cardiomegaly with increased bibasal atelectasis or infiltrates. Admitted to the hospital and cardiology was consulted.   Acute on chronic combined systolic and diastolic CHF (congestive heart failure) (HCC) Ischemic cardiomyopathy Clinically improved EF 74-08% Grade 3 diastolic dysfunction Cardiology was consulted Was started on IV Lasix and switch to p.o.  torsemide Recommended metolazone could be increased up to 2.5 mg 3 times a week -Long discussion concerning moderating his fluid intake -Close outpatient follow-up in cardiology in CHF clinic We have recommended daily weights, received a scale on discharge  Recommended he call our office for 3 pound weight gain Continue carvedilol 3.125 twice daily, hydralazine 25 twice daily, Imdur 30 daily, Entresto 24/26 twice daily Continue torsemide  Added Farxiga       Hypertensive urgency Improved and stable     Elevated troponin -Likely due to demand ischemia from his acute CHF especially in the setting of stage IIIb chronic kidney disease.    Type 2 diabetes mellitus with chronic kidney disease, with long-term current use of insulin ([place]) Continue home meds  Dyslipidemia -on  statin therapy.   Coronary artery disease without angina pectoris - Continue home meds   GERD without esophagitis -  continue PPI therapy.    Discharge Diagnoses:  Principal Problem:   Acute on chronic combined systolic and diastolic CHF (congestive heart failure) (HCC) Active Problems:   Hypertensive urgency   Type 2 diabetes mellitus with chronic kidney disease, with long-term current use of insulin (HCC)   Elevated troponin   Dyslipidemia   Coronary artery disease without angina pectoris   GERD without esophagitis    Discharge Instructions  Discharge Instructions     Diet - low sodium heart healthy   Complete by: As directed    Discharge instructions   Complete by: As directed    Close outpatient follow-up in cardiology in CHF clinic We have recommended daily weights, you will receive a scale at discharge Recommended he call our office for 3 pound weight gain Fluid  restriction as your cardiologist discussed with you.   Increase activity slowly   Complete by: As directed       Allergies as of 04/01/2022       Reactions   Angiotensin Receptor Blockers    hyperkalemia   Metformin  Diarrhea   Spironolactone    Hyperkalemia        Medication List     STOP taking these medications    acetaminophen 500 MG tablet Commonly known as: TYLENOL   furosemide 80 MG tablet Commonly known as: LASIX       TAKE these medications    acyclovir 800 MG tablet Commonly known as: ZOVIRAX Take 800 mg by mouth daily.   albuterol 108 (90 Base) MCG/ACT inhaler Commonly known as: VENTOLIN HFA Inhale 2 puffs into the lungs every 6 (six) hours as needed for wheezing or shortness of breath.   aspirin EC 81 MG tablet Take 1 tablet (81 mg total) by mouth daily. Swallow whole. Start taking on: Apr 02, 2022   carvedilol 3.125 MG tablet Commonly known as: COREG Take 1 tablet (3.125 mg total) by mouth 2 (two) times daily with a meal.   clopidogrel 75 MG tablet Commonly known as: PLAVIX TAKE 1 TABLET BY MOUTH EVERY DAY   dapagliflozin propanediol 10 MG Tabs tablet Commonly known as: FARXIGA Take 1 tablet (10 mg total) by mouth daily.   Entresto 24-26 MG Generic drug: sacubitril-valsartan Take 1 tablet by mouth 2 (two) times daily.   fluticasone 110 MCG/ACT inhaler Commonly known as: FLOVENT HFA Inhale into the lungs as needed.   hydrALAZINE 25 MG tablet Commonly known as: APRESOLINE Take 25 mg by mouth 2 times daily at 12 noon and 4 pm.   insulin detemir 100 UNIT/ML injection Commonly known as: LEVEMIR Inject 0.15 mLs (15 Units total) into the skin at bedtime.   isosorbide mononitrate 30 MG 24 hr tablet Commonly known as: IMDUR Take 1 tablet (30 mg total) by mouth daily.   magnesium oxide 400 MG tablet Commonly known as: MAG-OX Take 1 tablet (400 mg total) by mouth daily.   metolazone 2.5 MG tablet Commonly known as: ZAROXOLYN Take 1 tablet (2.5 mg total) by mouth 3 (three) times a week. Take 1 tablet (2.5) mg 1 hour before Lasix weekly on Tuesdays Start taking on: Apr 02, 2022 What changed:  how much to take how to take this when to take this    multivitamin with minerals Tabs tablet Take 1 tablet by mouth daily.   pantoprazole 40 MG tablet Commonly known as: PROTONIX Take 40 mg by mouth 2 (two) times daily.   potassium chloride SA 20 MEQ tablet Commonly known as: KLOR-CON M Take 1 tablet (20 mEq total) by mouth daily.   rosuvastatin 40 MG tablet Commonly known as: CRESTOR TAKE 1 TABLET BY MOUTH EVERY DAY   Torsemide 40 MG Tabs Take 40 mg by mouth 2 (two) times daily.        Follow-up Information     [name], MD Follow up.   Specialty: Cardiology Contact information: [place] [address] [phone]         Tomasita Morrow, MD Follow up in 1 week(s).   Specialty: Internal Medicine Contact information: [place] [address] [phone]         [facility] Follow up.   Specialty: Cardiology Contact information: [facility]  [place] [phone]               Allergies  Allergen Reactions   Angiotensin Receptor Blockers     hyperkalemia   Metformin Diarrhea   Spironolactone     Hyperkalemia     Consultations: Cardiology   Procedures/Studies: DG Chest 1 View  Result Date: 03/28/2022 CLINICAL DATA:  Shortness of breath EXAM: CHEST  1 VIEW COMPARISON:  03/24/2022 FINDINGS: Cardiomegaly. Left pacer remains in place, unchanged. Prior CABG. Mild vascular congestion. Increased densities in the lung bases could reflect atelectasis or infiltrates. Calcified subscapular mass in the right posterior chest wall is again noted and unchanged as seen on prior CT. IMPRESSION: Cardiomegaly, vascular congestion. Increasing bibasilar atelectasis or infiltrates. Electronically Signed   By: Rolm Baptise M.D.   On: 03/28/2022 18:02   DG Chest 2 View  Result Date: 03/24/2022 CLINICAL DATA:  Chest pain EXAM: CHEST - 2 VIEW COMPARISON:  Radiograph  01/04/2022, CT 08/18/2020 FINDINGS: LEFT-sided pacemaker overlies stable cardiac silhouette. Midline sternotomy atrial clip noted. There is bibasilar mild airspace disease. Upper lungs are clear. Lobular periphery calcified mass adjacent to the RIGHT scapula is unchanged from comparison CT. IMPRESSION: 1. Bibasilar airspace disease concerning for pneumonia 2. Post CABG Electronically Signed   By: Suzy Bouchard M.D.   On: 03/24/2022 15:11      Subjective: Shortness of breath has improved.  No chest pain.  No other complaints.  No dizziness  Discharge Exam: Vitals:   04/01/22 0726 04/01/22 1138  BP: 133/70 (!) 117/53  Pulse: 66 65  Resp: 19 18  Temp: 98.3 F (36.8 C) 98 F (36.7 C)  SpO2: 98% 94%   Vitals:   04/01/22 0340 04/01/22 0500 04/01/22 0726 04/01/22 1138  BP: 127/67  133/70 (!) 117/53  Pulse: 62  66 65  Resp: '20  19 18  '$ Temp: 98.2 F (36.8 C)  98.3 F (36.8 C) 98 F (36.7 C)  TempSrc: Oral  Oral   SpO2: 98%  98% 94%  Weight:  103.4 kg    Height:        General: Pt is alert, awake, not in acute distress Cardiovascular: RRR, S1/S2 +, no rubs, no gallops Respiratory: CTA bilaterally, no wheezing, no rhonchi Abdominal: Soft, NT, ND, bowel sounds + Extremities: no edema, no cyanosis    The results of significant diagnostics from this hospitalization (including imaging, microbiology, ancillary and laboratory) are listed below for reference.     Microbiology: Recent Results (from the past 240 hour(s))  Resp Panel by RT-PCR (Flu A&B, Covid) Nasopharyngeal Swab     Status: None   Collection Time: 03/28/22  5:48 PM   Specimen: Nasopharyngeal Swab; Nasopharyngeal(NP) swabs in vial transport medium  Result Value Ref Range Status   SARS Coronavirus 2 by RT PCR NEGATIVE NEGATIVE Final    Comment: (NOTE) SARS-CoV-2 target nucleic acids are NOT DETECTED.  The SARS-CoV-2 RNA is generally detectable in upper respiratory specimens during the acute phase of infection. The  lowest concentration of SARS-CoV-2 viral copies this assay can detect is 138 copies/mL. A negative result does not preclude SARS-Cov-2 infection and should not be used as the sole basis for treatment or other patient management decisions. A negative result may occur with  improper specimen collection/handling, submission of specimen other than nasopharyngeal swab, presence of viral mutation(s) within the areas targeted by this assay, and inadequate number of viral copies(<138 copies/mL). A negative result must be combined with clinical observations, patient history,  and epidemiological information. The expected result is Negative.  Fact Sheet for Patients:  EntrepreneurPulse.com.au  Fact Sheet for Healthcare Providers:  IncredibleEmployment.be  This test is no t yet approved or cleared by the [place] FDA and  has been authorized for detection and/or diagnosis of SARS-CoV-2 by FDA under an Emergency Use Authorization (EUA). This EUA will remain  in effect (meaning this test can be used) for the duration of the COVID-19 declaration under Section 564(b)(1) of the Act, 21 U.S.C.section 360bbb-3(b)(1), unless the authorization is terminated  or revoked sooner.       Influenza A by PCR NEGATIVE NEGATIVE Final   Influenza B by PCR NEGATIVE NEGATIVE Final    Comment: (NOTE) The Xpert Xpress SARS-CoV-2/FLU/RSV plus assay is intended as an aid in the diagnosis of influenza from Nasopharyngeal swab specimens and should not be used as a sole basis for treatment. Nasal washings and aspirates are unacceptable for Xpert Xpress SARS-CoV-2/FLU/RSV testing.  Fact Sheet for Patients: EntrepreneurPulse.com.au  Fact Sheet for Healthcare Providers: IncredibleEmployment.be  This test is not yet approved or cleared by the [place] FDA and has been authorized for detection and/or diagnosis of SARS-CoV-2 by FDA under  an Emergency Use Authorization (EUA). This EUA will remain in effect (meaning this test can be used) for the duration of the COVID-19 declaration under Section 564(b)(1) of the Act, 21 U.S.C. section 360bbb-3(b)(1), unless the authorization is terminated or revoked.  Performed at [facility], [address] [place], [place] [address]      Labs: BNP (last 3 results) Recent Labs    01/04/22 0739 03/24/22 1505 03/28/22 1713  BNP 2,093.8* 2,903.5* 7,371.0*   Basic Metabolic Panel: Recent Labs  Lab 03/28/22 1713 03/29/22 0338 03/30/22 0437 03/31/22 0616 04/01/22 0558  NA 143 144 143 140 137  K 3.9 3.9 3.3* 3.4* 3.4*  CL 115* 110 105 99 96*  CO2 '24 27 29 '$ 32 34*  GLUCOSE 86 116* 55* 109* 128*  BUN 24* 23 23 25* 25*  CREATININE 1.56* 1.73* 1.84* 1.88* 1.89*  CALCIUM 8.1* 8.4* 8.3* 8.6* 8.5*   Liver Function Tests: Recent Labs  Lab 03/28/22 1713  AST 21  ALT 18  ALKPHOS 63  BILITOT 0.8  PROT 5.7*  ALBUMIN 2.9*   No results for input(s): LIPASE, AMYLASE in the last 168 hours. No results for input(s): AMMONIA in the last 168 hours. CBC: Recent Labs  Lab 03/28/22 1713 03/29/22 0338 04/01/22 0558  WBC 5.4 5.6 4.7  NEUTROABS 3.7  --   --   HGB 13.7 13.3 14.5  HCT 45.3 44.3 46.8  MCV 85.2 84.4 81.8  PLT 181 147* 172   Cardiac Enzymes: No results for input(s): CKTOTAL, CKMB, CKMBINDEX, TROPONINI in the last 168 hours. BNP: Invalid input(s): POCBNP CBG: Recent Labs  Lab 03/31/22 1314 03/31/22 1619 03/31/22 2117 04/01/22 0728 04/01/22 1138  GLUCAP 286* 151* 162* 116* 281*   D-Dimer No results for input(s): DDIMER in the last 72 hours. Hgb A1c No results for input(s): HGBA1C in the last 72 hours. Lipid Profile No results for input(s): CHOL, HDL, LDLCALC, TRIG, CHOLHDL, LDLDIRECT in the last 72 hours. Thyroid function studies No results for input(s): TSH, T4TOTAL, T3FREE, THYROIDAB in the last 72 hours.  Invalid input(s): FREET3 Anemia  work up No results for input(s): VITAMINB12, FOLATE, FERRITIN, TIBC, IRON, RETICCTPCT in the last 72 hours. Urinalysis    Component Value Date/Time   COLORURINE YELLOW 09/01/2020 2035   APPEARANCEUR HAZY (A) 09/01/2020  2035   LABSPEC 1.010 09/01/2020 2035   PHURINE 5.0 09/01/2020 2035   GLUCOSEU NEGATIVE 09/01/2020 2035   HGBUR MODERATE (A) 09/01/2020 2035   BILIRUBINUR NEGATIVE 09/01/2020 2035   KETONESUR NEGATIVE 09/01/2020 2035   PROTEINUR NEGATIVE 09/01/2020 2035   NITRITE NEGATIVE 09/01/2020 2035   LEUKOCYTESUR TRACE (A) 09/01/2020 2035   Sepsis Labs Invalid input(s): PROCALCITONIN,  WBC,  LACTICIDVEN Microbiology Recent Results (from the past 240 hour(s))  Resp Panel by RT-PCR (Flu A&B, Covid) Nasopharyngeal Swab     Status: None   Collection Time: 03/28/22  5:48 PM   Specimen: Nasopharyngeal Swab; Nasopharyngeal(NP) swabs in vial transport medium  Result Value Ref Range Status   SARS Coronavirus 2 by RT PCR NEGATIVE NEGATIVE Final    Comment: (NOTE) SARS-CoV-2 target nucleic acids are NOT DETECTED.  The SARS-CoV-2 RNA is generally detectable in upper respiratory specimens during the acute phase of infection. The lowest concentration of SARS-CoV-2 viral copies this assay can detect is 138 copies/mL. A negative result does not preclude SARS-Cov-2 infection and should not be used as the sole basis for treatment or other patient management decisions. A negative result may occur with  improper specimen collection/handling, submission of specimen other than nasopharyngeal swab, presence of viral mutation(s) within the areas targeted by this assay, and inadequate number of viral copies(<138 copies/mL). A negative result must be combined with clinical observations, patient history, and epidemiological information. The expected result is Negative.  Fact Sheet for Patients:  EntrepreneurPulse.com.au  Fact Sheet for Healthcare Providers:   IncredibleEmployment.be  This test is no t yet approved or cleared by the [place] FDA and  has been authorized for detection and/or diagnosis of SARS-CoV-2 by FDA under an Emergency Use Authorization (EUA). This EUA will remain  in effect (meaning this test can be used) for the duration of the COVID-19 declaration under Section 564(b)(1) of the Act, 21 U.S.C.section 360bbb-3(b)(1), unless the authorization is terminated  or revoked sooner.       Influenza A by PCR NEGATIVE NEGATIVE Final   Influenza B by PCR NEGATIVE NEGATIVE Final    Comment: (NOTE) The Xpert Xpress SARS-CoV-2/FLU/RSV plus assay is intended as an aid in the diagnosis of influenza from Nasopharyngeal swab specimens and should not be used as a sole basis for treatment. Nasal washings and aspirates are unacceptable for Xpert Xpress SARS-CoV-2/FLU/RSV testing.  Fact Sheet for Patients: EntrepreneurPulse.com.au  Fact Sheet for Healthcare Providers: IncredibleEmployment.be  This test is not yet approved or cleared by the [place] FDA and has been authorized for detection and/or diagnosis of SARS-CoV-2 by FDA under an Emergency Use Authorization (EUA). This EUA will remain in effect (meaning this test can be used) for the duration of the COVID-19 declaration under Section 564(b)(1) of the Act, 21 U.S.C. section 360bbb-3(b)(1), unless the authorization is terminated or revoked.  Performed at [facility], [address] [place], [place] [address]      Time coordinating discharge: Over 30 minutes  SIGNED:   Nolberto Hanlon, MD  [facility] 04/01/2022, 12:42 PM Pager   If 7PM-7AM, please contact night-coverage [email or web address] Password [email or web address]

## 2022-04-01 NOTE — Progress Notes (Signed)
Progress Note  Patient Name: [name] Date of Encounter: 04/01/2022  [facility] Cardiologist: [name], MD   Subjective   Reports feeling well this morning with no complaints Denies significant leg swelling, no abdominal distention, no PND orthopnea Feels he is close to his baseline Long discussion with CHF nurse navigator with the patient today High-volume green tea at home, thought he can drink as much as he wanted Reports compliance with his medications, wife does these for him Does not eat out at fast food restaurants, cooks at home   Inpatient Medications    Scheduled Meds:  aspirin EC  81 mg Oral Daily   carvedilol  3.125 mg Oral BID WC   clopidogrel  75 mg Oral Daily   enoxaparin (LOVENOX) injection  0.5 mg/kg Subcutaneous Q24H   hydrALAZINE  25 mg Oral q12n4p   insulin aspart  0-9 Units Subcutaneous TID AC & HS   insulin detemir  15 Units Subcutaneous QHS   isosorbide mononitrate  30 mg Oral Daily   magnesium oxide  400 mg Oral Daily   metolazone  2.5 mg Oral Daily   multivitamin with minerals  1 tablet Oral Daily   pantoprazole  40 mg Oral BID   potassium chloride SA  20 mEq Oral Daily   rosuvastatin  40 mg Oral Daily   sacubitril-valsartan  1 tablet Oral BID   sodium chloride flush  3 mL Intravenous Q12H   torsemide  40 mg Oral BID   Continuous Infusions:  sodium chloride     PRN Meds: sodium chloride, acetaminophen, albuterol, ALPRAZolam, ondansetron (ZOFRAN) IV, sodium chloride flush   Vital Signs    Vitals:   03/31/22 2314 04/01/22 0340 04/01/22 0500 04/01/22 0726  BP: 101/68 127/67  133/70  Pulse: 72 62  66  Resp: '20 20  19  '$ Temp: 98.4 F (36.9 C) 98.2 F (36.8 C)  98.3 F (36.8 C)  TempSrc:  Oral  Oral  SpO2: 97% 98%  98%  Weight:   103.4 kg   Height:        Intake/Output Summary (Last 24 hours) at 04/01/2022 1107 Last data filed at 04/01/2022 0949 Gross per 24 hour  Intake 1200 ml  Output 3475 ml  Net -2275 ml       04/01/2022    5:00 AM 03/31/2022    4:00 AM 03/30/2022    3:31 AM  Last 3 Weights  Weight (lbs) 227 lb 15.3 oz 232 lb 12.9 oz 234 lb 8 oz  Weight (kg) 103.4 kg 105.6 kg 106.369 kg      Telemetry    Paced rhythm- Personally Reviewed  ECG     - Personally Reviewed  Physical Exam   GEN: No acute distress.   Neck:  JVD 8 Cardiac: RRR, no murmurs, rubs, or gallops.  Respiratory: Clear to auscultation bilaterally. GI: Soft, nontender, non-distended  MS: No edema; No deformity. Neuro:  Nonfocal  Psych: Normal affect   Labs    High Sensitivity Troponin:   Recent Labs  Lab 03/24/22 1505 03/24/22 1742 03/28/22 1713 03/28/22 1913  TROPONINIHS 24* 25* 191* 212*     Chemistry Recent Labs  Lab 03/28/22 1713 03/29/22 0338 03/30/22 0437 03/31/22 0616 04/01/22 0558  NA 143   < > 143 140 137  K 3.9   < > 3.3* 3.4* 3.4*  CL 115*   < > 105 99 96*  CO2 24   < > 29 32 34*  GLUCOSE 86   < >  55* 109* 128*  BUN 24*   < > 23 25* 25*  CREATININE 1.56*   < > 1.84* 1.88* 1.89*  CALCIUM 8.1*   < > 8.3* 8.6* 8.5*  PROT 5.7*  --   --   --   --   ALBUMIN 2.9*  --   --   --   --   AST 21  --   --   --   --   ALT 18  --   --   --   --   ALKPHOS 63  --   --   --   --   BILITOT 0.8  --   --   --   --   GFRNONAA 44*   < > 36* 35* 35*  ANIONGAP 4*   < > '9 9 7   '$ < > = values in this interval not displayed.    Lipids No results for input(s): CHOL, TRIG, HDL, LABVLDL, LDLCALC, CHOLHDL in the last 168 hours.  Hematology Recent Labs  Lab 03/28/22 1713 03/29/22 0338 04/01/22 0558  WBC 5.4 5.6 4.7  RBC 5.32 5.25 5.72  HGB 13.7 13.3 14.5  HCT 45.3 44.3 46.8  MCV 85.2 84.4 81.8  MCH 25.8* 25.3* 25.3*  MCHC 30.2 30.0 31.0  RDW 17.1* 17.2* 16.9*  PLT 181 147* 172   Thyroid No results for input(s): TSH, FREET4 in the last 168 hours.  BNP Recent Labs  Lab 03/28/22 1713  BNP 3,468.6*    DDimer No results for input(s): DDIMER in the last 168 hours.   Radiology    No results  found.  Cardiac Studies   Echocardiogram   1. Left ventricular ejection fraction, by estimation, is 30 to 35%. The  left ventricle has moderately decreased function. The left ventricle  demonstrates global hypokinesis. There is mild left ventricular  hypertrophy. Left ventricular diastolic  parameters are consistent with Grade III diastolic dysfunction  (restrictive). Elevated left atrial pressure.   2. Right ventricular systolic function is moderately reduced. The right  ventricular size is mildly enlarged. Mildly increased right ventricular  wall thickness. Tricuspid regurgitation signal is inadequate for assessing  PA pressure.   3. Left atrial size was mildly dilated.   4. The mitral valve is degenerative. Mild mitral valve regurgitation. No  evidence of mitral stenosis.   5. The aortic valve is tricuspid. There is moderate calcification of the  aortic valve. There is mild thickening of the aortic valve. Aortic valve  regurgitation is not visualized. Aortic valve sclerosis/calcification is  present, without any evidence of  aortic stenosis.   Patient Profile     87 y.o. 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 -At home was taking metolazone 2.5 twice a week Recommended metolazone could be increased up to 2.5 mg 3 times a week -Long discussion concerning moderating his fluid  intake -Close outpatient follow-up in  cardiology in CHF clinic We have recommended daily weights, he will receive a scale at discharge Recommended he call our office for 3 pound weight gain  Hypertensive urgency in the setting of diastolic and systolic CHF Blood pressure is dramatically improved with diuresis Continue carvedilol 3.125 twice daily, hydralazine 25 twice daily, Imdur 30 daily, Entresto 24/26 twice daily -Little room to advance his medications given periodic hypotension  Coronary artery disease with stable angina, status post CABG times 12 August 2020 Elevated troponin peak around 200 in the setting of acute on chronic CHF -Demand ischemia in the setting of above -Recent ischemic work-up October 2022, patent grafts No plan for further ischemic work-up at this time  Chronic renal failure Creatinine stable We will need close outpatient monitoring on torsemide with metolazone 3 times a week  Complete heart block, CRT-P Followed by EP Implantation of device October 12, 2021  Long discussion with patient concerning CHF, education started Discussed close outpatient follow-up, titration of diuretics based on his daily weights  Total encounter time more than 50 minutes  Greater than 50% was spent in counseling and coordination of care with the patient    For questions or updates, please contact [facility] Please consult [email or web address] for contact info under        Signed, Ida Rogue, MD  04/01/2022, 11:07 AM

## 2022-04-01 NOTE — TOC Benefit Eligibility Note (Signed)
Patient Teacher, English as a foreign language completed.    The patient is currently admitted and upon discharge could be taking Farxiga 10 mg.  The current 30 day co-pay is, $0.00.   The patient is insured through [place], [place] Patient Advocate Specialist [place] Patient Advocate Team Direct Number: [phone]  Fax: [phone]

## 2022-04-01 NOTE — Progress Notes (Addendum)
   Heart Failure Nurse Navigator Note  Met with patient today.  States that he is feeling back to himself and is hoping to be going home today.  By teach back method went over what to report as far as weight gain, changes in symptoms.  Also discussed using the weight chart for documentation along with using the zones that are contained on the zone magnet.  Reinforced bringing his weight charts to his doctors appointments.  Stressed being compliant with his fluid restriction, he knows that he drinks over 4-16 ounce bottles of water that he is gone over his fluid restriction.  He also voices that with any additional fluid  intake, such as coffee, juice milk he would have to decrease the water intake.  Dr. Rockey Situ was present during part of the interview he stressed being compliant with fluid restriction, taking medications as prescribed and following low-sodium diet.  Along with weighing daily and bringing his weight charts to his appointments.  He also discussed his new water pill-torsemide and that he also would be taking additional booster diuretic as prescribed.  Went over seasonings that were appropriate to use along with foods that were appropriate and ones that were not appropriate with heart failure.  He has no further questions.  Pricilla Riffle RN CHFN

## 2022-04-01 NOTE — TOC Transition Note (Signed)
Transition of Care [facility]) - CM/SW Discharge Note   Patient Details  Name: [name] MRN: [id number] Date of Birth: 1935/09/10  Transition of Care [facility]) CM/SW Contact:  Candie Chroman, LCSW Phone Number: 04/01/2022, 12:43 PM   Clinical Narrative:  Patient has orders to discharge home today. No further concerns. CSW signing off.   Final next level of care: Home/Self Care Barriers to Discharge: Barriers Resolved   Patient Goals and CMS Choice        Discharge Placement                    Patient and family notified of of transfer: 04/01/22  Discharge Plan and Services                                     Social Determinants of Health (SDOH) Interventions     Readmission Risk Interventions    03/29/2022    1:18 PM 01/05/2022    2:16 PM 10/29/2021   10:22 AM  Readmission Risk Prevention Plan  Transportation Screening Complete Complete Complete  PCP or Specialist Appt within 3-5 Days Complete Complete Complete  HRI or Home Care Consult  Complete Complete  Social Work Consult for [place] Planning/Counseling  Complete Complete  Palliative Care Screening Not Applicable Not Applicable Not Applicable  Medication Review Press photographer) Complete Complete Complete

## 2022-04-02 ENCOUNTER — Ambulatory Visit: Payer: Medicare HMO | Admitting: Family

## 2022-04-07 ENCOUNTER — Encounter: Payer: Self-pay | Admitting: Pharmacist

## 2022-04-07 ENCOUNTER — Encounter: Payer: Self-pay | Admitting: Family

## 2022-04-07 ENCOUNTER — Ambulatory Visit (HOSPITAL_BASED_OUTPATIENT_CLINIC_OR_DEPARTMENT_OTHER): Payer: Medicare HMO | Admitting: Family

## 2022-04-07 ENCOUNTER — Other Ambulatory Visit
Admission: RE | Admit: 2022-04-07 | Discharge: 2022-04-07 | Disposition: A | Discharge status: Home / Self Care | Payer: Medicare HMO | Source: Ambulatory Visit | Attending: Family | Admitting: Family

## 2022-04-07 VITALS — BP 107/58 | HR 69 | Resp 16 | Ht 68.0 in | Wt 221.4 lb

## 2022-04-07 DIAGNOSIS — N1832 Chronic kidney disease, stage 3b: Secondary | ICD-10-CM

## 2022-04-07 DIAGNOSIS — N183 Chronic kidney disease, stage 3 unspecified: Secondary | ICD-10-CM | POA: Insufficient documentation

## 2022-04-07 DIAGNOSIS — Z794 Long term (current) use of insulin: Secondary | ICD-10-CM | POA: Insufficient documentation

## 2022-04-07 DIAGNOSIS — I10 Essential (primary) hypertension: Secondary | ICD-10-CM

## 2022-04-07 DIAGNOSIS — E1122 Type 2 diabetes mellitus with diabetic chronic kidney disease: Secondary | ICD-10-CM | POA: Insufficient documentation

## 2022-04-07 DIAGNOSIS — I2510 Atherosclerotic heart disease of native coronary artery without angina pectoris: Secondary | ICD-10-CM | POA: Diagnosis not present

## 2022-04-07 DIAGNOSIS — Z79899 Other long term (current) drug therapy: Secondary | ICD-10-CM | POA: Insufficient documentation

## 2022-04-07 DIAGNOSIS — E785 Hyperlipidemia, unspecified: Secondary | ICD-10-CM | POA: Insufficient documentation

## 2022-04-07 DIAGNOSIS — I459 Conduction disorder, unspecified: Secondary | ICD-10-CM | POA: Insufficient documentation

## 2022-04-07 DIAGNOSIS — I890 Lymphedema, not elsewhere classified: Secondary | ICD-10-CM | POA: Insufficient documentation

## 2022-04-07 DIAGNOSIS — I5022 Chronic systolic (congestive) heart failure: Secondary | ICD-10-CM | POA: Insufficient documentation

## 2022-04-07 DIAGNOSIS — Z950 Presence of cardiac pacemaker: Secondary | ICD-10-CM | POA: Insufficient documentation

## 2022-04-07 DIAGNOSIS — Z7984 Long term (current) use of oral hypoglycemic drugs: Secondary | ICD-10-CM | POA: Insufficient documentation

## 2022-04-07 DIAGNOSIS — Z87891 Personal history of nicotine dependence: Secondary | ICD-10-CM | POA: Insufficient documentation

## 2022-04-07 DIAGNOSIS — I130 Hypertensive heart and chronic kidney disease with heart failure and stage 1 through stage 4 chronic kidney disease, or unspecified chronic kidney disease: Secondary | ICD-10-CM | POA: Insufficient documentation

## 2022-04-07 LAB — BASIC METABOLIC PANEL
Anion gap: 11 (ref 5–15)
BUN: 41 mg/dL — ABNORMAL HIGH (ref 8–23)
CO2: 27 mmol/L (ref 22–32)
Calcium: 8.2 mg/dL — ABNORMAL LOW (ref 8.9–10.3)
Chloride: 101 mmol/L (ref 98–111)
Creatinine, Ser: 2.28 mg/dL — ABNORMAL HIGH (ref 0.61–1.24)
GFR, Estimated: 28 mL/min — ABNORMAL LOW (ref >60)
Glucose, Bld: 150 mg/dL — ABNORMAL HIGH (ref 70–99)
Potassium: 3.4 mmol/L — ABNORMAL LOW (ref 3.5–5.1)
Sodium: 139 mmol/L (ref 135–145)

## 2022-04-07 LAB — CBC WITH DIFFERENTIAL/PLATELET
Abs Immature Granulocytes: 0 10*3/uL (ref 0.00–0.07)
Basophils Absolute: 0 10*3/uL (ref 0.0–0.1)
Basophils Relative: 1 %
Eosinophils Absolute: 0.3 10*3/uL (ref 0.0–0.5)
Eosinophils Relative: 8 %
HCT: 47.2 % (ref 39.0–52.0)
Hemoglobin: 14.3 g/dL (ref 13.0–17.0)
Immature Granulocytes: 0 %
Lymphocytes Relative: 37 %
Lymphs Abs: 1.5 10*3/uL (ref 0.7–4.0)
MCH: 25.4 pg — ABNORMAL LOW (ref 26.0–34.0)
MCHC: 30.3 g/dL (ref 30.0–36.0)
MCV: 83.8 fL (ref 80.0–100.0)
Monocytes Absolute: 0.5 10*3/uL (ref 0.1–1.0)
Monocytes Relative: 13 %
Neutro Abs: 1.6 10*3/uL — ABNORMAL LOW (ref 1.7–7.7)
Neutrophils Relative %: 41 %
Platelets: 158 10*3/uL (ref 150–400)
RBC: 5.63 MIL/uL (ref 4.22–5.81)
RDW: 16.1 % — ABNORMAL HIGH (ref 11.5–15.5)
WBC: 4 10*3/uL (ref 4.0–10.5)
nRBC: 0 % (ref 0.0–0.2)

## 2022-04-07 MED ORDER — METOLAZONE 2.5 MG PO TABS: 2.5000 mg | ORAL_TABLET | ORAL | 5 refills | Status: DC

## 2022-04-07 NOTE — Patient Instructions (Signed)
Continue weighing daily and call for an overnight weight gain of 3 pounds or more or a weekly weight gain of more than 5 pounds.   If you have voicemail, please make sure your mailbox is cleaned out so that we may leave a message and please make sure to listen to any voicemails.     

## 2022-04-07 NOTE — Progress Notes (Signed)
[place] - PHARMACIST COUNSELING NOTE  Guideline-Directed Medical Therapy/Evidence Based Medicine  ACE/ARB/ARNI:  Delene Loll ; hydralazine/isosorbide Beta Blocker: Carvedilol 3.125 mg twice daily Aldosterone Antagonist:  none- hyperkalemia in the past Diuretic: Torsemide '40mg'$  BID plus metolazone 2.'5mg'$  3x/week SGLT2i: Farxiga '10mg'$  daily  Adherence Assessment  Do you ever forget to take your medication? '[]'$ Yes '[x]'$ No  Do you ever skip doses due to side effects? '[]'$ Yes '[x]'$ No  Do you have trouble affording your medicines? '[]'$ Yes '[x]'$ No  Are you ever unable to pick up your medication due to transportation difficulties? '[]'$ Yes '[x]'$ No  Do you ever stop taking your medications because you don't believe they are helping? '[]'$ Yes '[x]'$ No  Do you check your weight daily? '[x]'$ Yes '[]'$ No   Adherence strategy: pill box fill by wife  Barriers to obtaining medications: none  Diet: avoid adding salt to food  Vital signs: HR 45, BP 134/95 , weight (pounds) 230 lbs (gain 8 lbs)  ECHO: Date 08/27/21, EF 35-40%, The left ventricle has moderately decreased function. The left ventricle demonstrates global hypokinesis. Concern for anterior, anteroseptal and apical hypokinesis. The left ventricular  internal cavity size was mildly dilated      Latest Ref Rng & Units 04/01/2022    5:58 AM 03/31/2022    6:16 AM 03/30/2022    4:37 AM  BMP  Glucose 70 - 99 mg/dL 128   109   55    BUN 8 - 23 mg/dL '25   25   23    '$ Creatinine 0.61 - 1.24 mg/dL 1.89   1.88   1.84    Sodium 135 - 145 mmol/L 137   140   143    Potassium 3.5 - 5.1 mmol/L 3.4   3.4   3.3    Chloride 98 - 111 mmol/L 96   99   105    CO2 22 - 32 mmol/L 34   32   29    Calcium 8.9 - 10.3 mg/dL 8.5   8.6   8.3      Past Medical History:  Diagnosis Date   AV block, Mobitz 1    CAD (coronary artery disease)    a. 08/2019 VF arrest-->sev 3vd on cath-->CABGx5 (LIMA->LAD, VG->OM->LCX, VG->RPDA, VG->Diag); b.  08/2021 Cath: sev native multivessel dzs w/ 5/5 patent grafts. Nl filling pressures->Med rx.   CKD (chronic kidney disease), stage III (HCC)    Diabetes mellitus without complication ([place])    HFrEF (heart failure with reduced ejection fraction) ([place])    a. 08/2020 Echo: EF 40-45%; b. 11/2020 Echo: EF 55%, no rwma, mild LVH, Gr2 DD. nl RV fxn; c. 08/2021 Echo: EF 35-40%, glob HK w/ ant/antsept/apical HK. Reduced RV fxn. Mildly dil LA; d. 10/2021 s/p CRT-P; e. 12/2021 Echo: EF 30-35%, glob HK, GrIII DD. Mod red RV fxn, mild LAE, mild MR, AoV sclerosis.   Hyperlipidemia LDL goal <70    Hypertension    Intermittent Complete heart block ([place])    a. 08/2021 noted on Zio; b. 10/2021 s/p Abbott Allure RF CRT-P (Ser # [id number]).   Ischemic cardiomyopathy    a. 08/2020 Echo: EF 40-45%; b. 11/2020 Echo: EF 55%; c. 08/2021 Echo: EF 35-40%; d. 10/2021 s/p CRT-P; e. 12/2021 Echo: EF 30-35%, glob HK, GrIII DD.   Trifascicular block     ASSESSMENT 87 year old male who presents to the HF clinic for follow up. PMH includes CAD, HFrEF, CKD, DM, HTN, and HLD. Patient reports SOB greatly improved  and swelling LEE stable. Continues to use compression stockings. Noted recent inpatient admission on 5/21 for HF exacerbation. Metolazone dose was increased from 2.'5mg'$  every Tuesday *1x/week) to 2.'5mg'$  three times per week. Farxiga '10mg'$  daily also added to regimen during last admission.  During medrec was noted patient is NOT taking farxiga and is out of metolazone. Avoid premixed seafood batter and sauces to avoid added sodium in diet also discussed. Patient continues to weight daily at home and denies s/sx of hypervolemia.  PLAN  Resume metolazone 2.'5mg'$  three times daily Resume Farxiga '10mg'$  daily (rx ready for pick up per call [facility]) Dc MVI, patient not taking Repeat BMP and BNP  Follow up as recommended by NP  Time spent: 15 minutes  Ilda Laskin Rodriguez-Guzman PharmD, BCPS 04/07/2022 1:41 PM   Current  Outpatient Medications:    acyclovir (ZOVIRAX) 800 MG tablet, Take 800 mg by mouth daily., Disp: , Rfl:    albuterol (VENTOLIN HFA) 108 (90 Base) MCG/ACT inhaler, Inhale 2 puffs into the lungs every 6 (six) hours as needed for wheezing or shortness of breath. (Patient not taking: Reported on 04/07/2022), Disp: , Rfl:    aspirin EC 81 MG tablet, Take 1 tablet (81 mg total) by mouth daily. Swallow whole., Disp: 30 tablet, Rfl: 0   carvedilol (COREG) 3.125 MG tablet, Take 1 tablet (3.125 mg total) by mouth 2 (two) times daily with a meal., Disp: 180 tablet, Rfl: 3   clopidogrel (PLAVIX) 75 MG tablet, TAKE 1 TABLET BY MOUTH EVERY DAY (Patient taking differently: Take 75 mg by mouth daily.), Disp: 90 tablet, Rfl: 1   dapagliflozin propanediol (FARXIGA) 10 MG TABS tablet, Take 1 tablet (10 mg total) by mouth daily. (Patient not taking: Reported on 04/07/2022), Disp: 30 tablet, Rfl: 0   ENTRESTO 24-26 MG, Take 1 tablet by mouth 2 (two) times daily., Disp: , Rfl:    fluticasone (FLOVENT HFA) 110 MCG/ACT inhaler, Inhale into the lungs as needed. (Patient not taking: Reported on 03/28/2022), Disp: , Rfl:    hydrALAZINE (APRESOLINE) 25 MG tablet, Take 25 mg by mouth 2 times daily at 12 noon and 4 pm., Disp: , Rfl:    insulin detemir (LEVEMIR) 100 UNIT/ML injection, Inject 0.15 mLs (15 Units total) into the skin at bedtime., Disp: 13.5 mL, Rfl: 0   isosorbide mononitrate (IMDUR) 30 MG 24 hr tablet, Take 1 tablet (30 mg total) by mouth daily., Disp: 90 tablet, Rfl: 3   magnesium oxide (MAG-OX) 400 MG tablet, Take 1 tablet (400 mg total) by mouth daily., Disp: 90 tablet, Rfl: 1   metolazone (ZAROXOLYN) 2.5 MG tablet, Take 1 tablet (2.5 mg total) by mouth 3 (three) times a week., Disp: 12 tablet, Rfl: 5   pantoprazole (PROTONIX) 40 MG tablet, Take 40 mg by mouth 2 (two) times daily., Disp: , Rfl:    potassium chloride SA (KLOR-CON M) 20 MEQ tablet, Take 1 tablet (20 mEq total) by mouth daily., Disp: 30 tablet, Rfl: 5    rosuvastatin (CRESTOR) 40 MG tablet, TAKE 1 TABLET BY MOUTH EVERY DAY (Patient taking differently: Take 40 mg by mouth daily.), Disp: 90 tablet, Rfl: 0   torsemide 40 MG TABS, Take 40 mg by mouth 2 (two) times daily., Disp: 60 tablet, Rfl: 0   MEDICATION ADHERENCES TIPS AND STRATEGIES Taking medication as prescribed improves patient outcomes in heart failure (reduces hospitalizations, improves symptoms, increases survival) Side effects of medications can be managed by decreasing doses, switching agents, stopping drugs, or adding additional  therapy. Please let someone in the [facility] know if you have having bothersome side effects so we can modify your regimen. Do not alter your medication regimen without talking to [place].  Medication reminders can help patients remember to take drugs on time. If you are missing or forgetting doses you can try linking behaviors, using pill boxes, or an electronic reminder like an alarm on your phone or an app. Some people can also get automated phone calls as medication reminders.

## 2022-04-07 NOTE — Progress Notes (Signed)
Patient ID: [name], male    DOB: [birth year], 87 y.o.   MRN: [id number]  HPI  [name] is a 87 y/o male with a history of heart block, CAD, DM, hyperlipidemia, HTN, pacemaker implantation, CKD, previous tobacco use and chronic heart failure.   Echo report from 01/05/22 reviewed and showed an EF of 30-35% along with mild LVH. Echo report from 08/27/21 reviewed and showed an EF of 35-40% along with mild LAE.   RHC/LHC done 08/31/21 & showed: Severe underlying three-vessel coronary artery disease with patent grafts including LIMA to LAD, SVG to large diagonal, sequential SVG to OM /distal left circumflex and SVG to right PDA. 2.  Right heart catheterization showed normal right and left-sided filling pressures, minimal pulmonary hypertension and normal cardiac output.  Admitted 03/28/22 due to acute onset of worsening dyspnea with associated dry cough and wheezing for the last week as well as associated lower extremity edema. BP elevated upon arrival to ED. Cardiology consult obtained. Initially given IV lasix with transition to oral diuretics. Elevated troponin thought to be due to demand ischemia. Discharged after 4 days. Was in the ED 03/24/22 due to  worsening shortness of breath, orthopnea and cough. Treated and released after good urine output.     Admitted 01/04/22 due to SOB due to acute on chronic HF. Initially given IV lasix with transition to oral diuretics. PT evaluation done. Discharged after 2 days. Admitted 10/27/21 due to chest pain and shortness of breath. Initially given IV lasix with transition to oral diuretics. Cardiology consult obtained. Discharged after 3 days. Admitted 10/12/21 due to getting pacemaker implanted. Discharged the next day.   He presents today for a follow-up visit with a chief complaint of minimal fatigue upon moderate exertion. Describes this as chronic in nature although improving. He has associated pedal edema, although improving, along with this. He denies any  difficulty sleeping, dizziness, abdominal distention, palpitations, chest pain, shortness of breath, cough or weight gain.   Hasn't started farxiga yet as he hasn't picked it up from the pharmacy. Is now taking metolazone 3 times/ week although current RX says weekly so he's about to run out of the medication.   Past Medical History:  Diagnosis Date   AV block, Mobitz 1    CAD (coronary artery disease)    a. 08/2019 VF arrest-->sev 3vd on cath-->CABGx5 (LIMA->LAD, VG->OM->LCX, VG->RPDA, VG->Diag); b. 08/2021 Cath: sev native multivessel dzs w/ 5/5 patent grafts. Nl filling pressures->Med rx.   CKD (chronic kidney disease), stage III (HCC)    Diabetes mellitus without complication ([place])    HFrEF (heart failure with reduced ejection fraction) ([place])    a. 08/2020 Echo: EF 40-45%; b. 11/2020 Echo: EF 55%, no rwma, mild LVH, Gr2 DD. nl RV fxn; c. 08/2021 Echo: EF 35-40%, glob HK w/ ant/antsept/apical HK. Reduced RV fxn. Mildly dil LA; d. 10/2021 s/p CRT-P; e. 12/2021 Echo: EF 30-35%, glob HK, GrIII DD. Mod red RV fxn, mild LAE, mild [place], AoV sclerosis.   Hyperlipidemia LDL goal <70    Hypertension    Intermittent Complete heart block ([place])    a. 08/2021 noted on Zio; b. 10/2021 s/p Abbott Allure RF CRT-P (Ser # [id number]).   Ischemic cardiomyopathy    a. 08/2020 Echo: EF 40-45%; b. 11/2020 Echo: EF 55%; c. 08/2021 Echo: EF 35-40%; d. 10/2021 s/p CRT-P; e. 12/2021 Echo: EF 30-35%, glob HK, GrIII DD.   Trifascicular block    Past Surgical History:  Procedure Laterality Date  BACK SURGERY     CARDIAC CATHETERIZATION     CLIPPING OF ATRIAL APPENDAGE N/A 08/29/2020   Procedure: CLIPPING OF ATRIAL APPENDAGE USING ATRICURE 14 MM ATRICLIP FLEX-V;  Surgeon: Grace Isaac, MD;  Location: [place];  Service: Open Heart Surgery;  Laterality: N/A;   CORONARY ARTERY BYPASS GRAFT N/A 08/29/2020   Procedure: CORONARY ARTERY BYPASS GRAFTING (CABG) X 5 USING LEFT INTERNAL MAMMARY ARTERY AND ENDOSCOPICALLY HARVESTED  RIGHT GREATER SAPHENOUS VEIN. LIMA TO LAD, SVG TO OM SEQ TO CIRC, SVG TO PD, SVG TO DIAG.;  Surgeon: Grace Isaac, MD;  Location: [place];  Service: Open Heart Surgery;  Laterality: N/A;   ENDOVEIN HARVEST OF GREATER SAPHENOUS VEIN Right 08/29/2020   Procedure: ENDOVEIN HARVEST OF GREATER SAPHENOUS VEIN;  Surgeon: Grace Isaac, MD;  Location: [place];  Service: Open Heart Surgery;  Laterality: Right;   HERNIA REPAIR     LEFT HEART CATH AND CORONARY ANGIOGRAPHY N/A 08/25/2020   Procedure: LEFT HEART CATH AND CORONARY ANGIOGRAPHY;  Surgeon: [name], MD;  Location: [place] [facility];  Service: Cardiovascular;  Laterality: N/A;   PACEMAKER IMPLANT N/A 10/12/2021   Procedure: PACEMAKER IMPLANT;  Surgeon: Vickie Epley, MD;  Location: [place] [facility];  Service: Cardiovascular;  Laterality: N/A;   RIGHT/LEFT HEART CATH AND CORONARY ANGIOGRAPHY N/A 08/31/2021   Procedure: RIGHT/LEFT HEART CATH AND CORONARY ANGIOGRAPHY;  Surgeon: [name], MD;  Location: [place] [facility];  Service: Cardiovascular;  Laterality: N/A;   TEE WITHOUT CARDIOVERSION N/A 08/29/2020   Procedure: TRANSESOPHAGEAL ECHOCARDIOGRAM (TEE);  Surgeon: Grace Isaac, MD;  Location: [place];  Service: Open Heart Surgery;  Laterality: N/A;    Family History  Problem Relation Age of Onset   Heart attack Mother    Social History   Tobacco Use   Smoking status: Former    Types: Cigarettes    Quit date: 07/26/1996    Years since quitting: 25.7   Smokeless tobacco: Never  Substance Use Topics   Alcohol use: Not Currently   Allergies  Allergen Reactions   Angiotensin Receptor Blockers     hyperkalemia   Metformin Diarrhea   Spironolactone     Hyperkalemia    Prior to Admission medications   Medication Sig Start Date End Date Taking? Authorizing Provider  acyclovir (ZOVIRAX) 800 MG tablet Take 800 mg by mouth daily. 12/02/20  Yes [provider]  aspirin EC 81 MG tablet Take 1  tablet (81 mg total) by mouth daily. Swallow whole. 04/02/22 05/02/22 Yes Nolberto Hanlon, MD  carvedilol (COREG) 3.125 MG tablet Take 1 tablet (3.125 mg total) by mouth 2 (two) times daily with a meal. 11/12/21  Yes [name], MD  clopidogrel (PLAVIX) 75 MG tablet TAKE 1 TABLET BY MOUTH EVERY DAY Patient taking differently: Take 75 mg by mouth daily. 05/17/21  Yes Loel Dubonnet, NP  ENTRESTO 24-26 MG Take 1 tablet by mouth 2 (two) times daily. 02/24/22  Yes [provider]  hydrALAZINE (APRESOLINE) 25 MG tablet Take 25 mg by mouth 2 times daily at 12 noon and 4 pm.   Yes [provider]  insulin detemir (LEVEMIR) 100 UNIT/ML injection Inject 0.15 mLs (15 Units total) into the skin at bedtime. 10/30/21  Yes Enzo Bi, MD  isosorbide mononitrate (IMDUR) 30 MG 24 hr tablet Take 1 tablet (30 mg total) by mouth daily. 11/25/21 04/07/22 Yes Flonnie Wierman, Otila Kluver A, FNP  magnesium oxide (MAG-OX) 400 MG tablet Take  1 tablet (400 mg total) by mouth daily. 01/20/22  Yes Vickie Epley, MD  pantoprazole (PROTONIX) 40 MG tablet Take 40 mg by mouth 2 (two) times daily.   Yes [provider]  potassium chloride SA (KLOR-CON M) 20 MEQ tablet Take 1 tablet (20 mEq total) by mouth daily. 01/28/22  Yes Augustus Zurawski, Otila Kluver A, FNP  rosuvastatin (CRESTOR) 40 MG tablet TAKE 1 TABLET BY MOUTH EVERY DAY Patient taking differently: Take 40 mg by mouth daily. 12/14/21  Yes [name], MD  torsemide 40 MG TABS Take 40 mg by mouth 2 (two) times daily. 04/01/22 05/01/22 Yes Nolberto Hanlon, MD  albuterol (VENTOLIN HFA) 108 (90 Base) MCG/ACT inhaler Inhale 2 puffs into the lungs every 6 (six) hours as needed for wheezing or shortness of breath. Patient not taking: Reported on 04/07/2022 09/30/21   [provider]  dapagliflozin propanediol (FARXIGA) 10 MG TABS tablet Take 1 tablet (10 mg total) by mouth daily. Patient not taking: Reported on 04/07/2022 04/01/22 05/01/22  Nolberto Hanlon, MD  fluticasone  (FLOVENT HFA) 110 MCG/ACT inhaler Inhale into the lungs as needed. Patient not taking: Reported on 03/28/2022    [provider]  metolazone (ZAROXOLYN) 2.5 MG tablet Take 1 tablet (2.5 mg total) by mouth 3 (three) times a week. 04/07/22   Alisa Graff, FNP    Review of Systems  Constitutional:  Positive for fatigue. Negative for appetite change.  HENT:  Negative for congestion, postnasal drip and sneezing.   Eyes: Negative.   Respiratory:  Negative for cough, chest tightness and shortness of breath.   Cardiovascular:  Positive for leg swelling (iimproving). Negative for chest pain and palpitations.  Gastrointestinal:  Negative for abdominal distention and abdominal pain.  Endocrine: Negative.   Genitourinary: Negative.   Musculoskeletal:  Negative for back pain and neck pain.  Skin: Negative.   Allergic/Immunologic: Negative.   Neurological:  Negative for dizziness and light-headedness.  Hematological:  Negative for adenopathy. Does not bruise/bleed easily.  Psychiatric/Behavioral:  Negative for dysphoric mood and sleep disturbance (sleeping on 1 pillows). The patient is not nervous/anxious.    Vitals:   04/07/22 1116  BP: (!) 107/58  Pulse: 69  Resp: 16  SpO2: 96%  Weight: 221 lb 6 oz (100.4 kg)  Height: '5\' 8"'$  (1.727 m)   Wt Readings from Last 3 Encounters:  04/07/22 221 lb 6 oz (100.4 kg)  04/01/22 227 lb 15.3 oz (103.4 kg)  03/24/22 232 lb (105.2 kg)   Lab Results  Component Value Date   CREATININE 1.89 (H) 04/01/2022   CREATININE 1.88 (H) 03/31/2022   CREATININE 1.84 (H) 03/30/2022   Physical Exam Vitals and nursing note reviewed.  Constitutional:      Appearance: He is well-developed.  HENT:     Head: Normocephalic and atraumatic.  Neck:     Vascular: No JVD.  Cardiovascular:     Rate and Rhythm: Normal rate. Rhythm irregular.  Pulmonary:     Effort: Pulmonary effort is normal. No tachypnea or accessory muscle usage.  Abdominal:     Palpations:  Abdomen is soft.     Tenderness: There is no abdominal tenderness.  Musculoskeletal:     Cervical back: Neck supple.     Right lower leg: No tenderness. Edema (1+ pitting) present.     Left lower leg: No tenderness. Edema (1+ pitting) present.  Skin:    General: Skin is warm and dry.  Neurological:     General: No focal deficit present.  Mental Status: He is alert and oriented to person, place, and time.  Psychiatric:        Mood and Affect: Mood normal.        Behavior: Behavior normal.   Assessment & Plan:  1: Chronic heart failure with reduced ejection fraction- - NYHA class II - euvolemic today - weighing daily; reminded to call for an overnight weight gain of >2 pounds or a weekly weight gain of > 5 pounds - weight down 12 pounds from last visit here 6 weeks ago - not adding salt and he says that his wife doesn't cook with salt either; does ask about using store bought batter to fry his fish in; explained that it will have quite a bit of salt he should be able to do it ~ once / month - had pacemaker implanted 10/12/21 - had hyperkalemia with spironolactone - on GDMT of carvedilol & losartan - will go by pharmacy and pick up [place] - new RX for metolazone sent to pharmacy so that he can take it 3 times/ week - will check BMP/CBC per discharge summary - BNP 03/28/22 was 3468.6 - PharmD reconciled medications  2: HTN with CKD- - BP looks good (107/58) - saw PCP 01/11/22 - BMP 04/02/22 reviewed and showed sodium 137, potassium 3.4, creatinine 1.89 and GFR 35  3: Type 2 DM- - A1c 10/28/21 was 6.8%  4: CAD- - saw cardiology Sharolyn Douglas) 02/02/22  5: Lymphedema-  - stage 2 - tries to elevate legs when sitting for long periods of time - wearing compression socks daily - edema much improved from last visit here   Mediation bottles reviewed.   Return in 1 month, sooner if needed.

## 2022-04-08 ENCOUNTER — Telehealth: Payer: Self-pay | Admitting: Family

## 2022-04-08 ENCOUNTER — Other Ambulatory Visit: Payer: Self-pay | Admitting: Family

## 2022-04-08 MED ORDER — METOLAZONE 2.5 MG PO TABS
2.5000 mg | ORAL_TABLET | ORAL | 5 refills | Status: DC
Start: 2022-04-08 — End: 2022-08-02

## 2022-04-08 NOTE — Telephone Encounter (Signed)
Spoke with patient regarding BMP results obtained yesterday. Renal function has worsened since hospital discharge and he was discharged with metolazone 2.'5mg'$  3 times/ week.   Instructed patient to decrease this to once/ week. If his weight starts to rise, he needs to call us.   Potassium is slightly low at 3.4 so advised him to take an additional 5mq today and tomorrow with his regular 235m dose.   He sees cardiology next week so will ask them to recheck his labs. Patient verbalized understanding.

## 2022-04-08 NOTE — Progress Notes (Signed)
Changed metolazone to weekly due to renal function

## 2022-04-14 ENCOUNTER — Ambulatory Visit (INDEPENDENT_AMBULATORY_CARE_PROVIDER_SITE_OTHER): Payer: Medicare HMO | Admitting: Nurse Practitioner

## 2022-04-14 ENCOUNTER — Encounter: Payer: Self-pay | Admitting: Nurse Practitioner

## 2022-04-14 ENCOUNTER — Telehealth: Payer: Self-pay | Admitting: *Deleted

## 2022-04-14 ENCOUNTER — Other Ambulatory Visit
Admission: RE | Admit: 2022-04-14 | Discharge: 2022-04-14 | Disposition: A | Discharge status: Home / Self Care | Payer: Medicare HMO | Source: Ambulatory Visit | Attending: Nurse Practitioner | Admitting: Nurse Practitioner

## 2022-04-14 VITALS — BP 130/70 | HR 62 | Ht 68.0 in | Wt 219.8 lb

## 2022-04-14 DIAGNOSIS — N183 Chronic kidney disease, stage 3 unspecified: Secondary | ICD-10-CM

## 2022-04-14 DIAGNOSIS — I10 Essential (primary) hypertension: Secondary | ICD-10-CM

## 2022-04-14 DIAGNOSIS — E876 Hypokalemia: Secondary | ICD-10-CM | POA: Insufficient documentation

## 2022-04-14 DIAGNOSIS — I2510 Atherosclerotic heart disease of native coronary artery without angina pectoris: Secondary | ICD-10-CM

## 2022-04-14 DIAGNOSIS — I5042 Chronic combined systolic (congestive) and diastolic (congestive) heart failure: Secondary | ICD-10-CM | POA: Insufficient documentation

## 2022-04-14 DIAGNOSIS — I255 Ischemic cardiomyopathy: Secondary | ICD-10-CM

## 2022-04-14 DIAGNOSIS — E785 Hyperlipidemia, unspecified: Secondary | ICD-10-CM

## 2022-04-14 LAB — BASIC METABOLIC PANEL
Anion gap: 7 (ref 5–15)
BUN: 41 mg/dL — ABNORMAL HIGH (ref 8–23)
CO2: 28 mmol/L (ref 22–32)
Calcium: 8.3 mg/dL — ABNORMAL LOW (ref 8.9–10.3)
Chloride: 104 mmol/L (ref 98–111)
Creatinine, Ser: 2.32 mg/dL — ABNORMAL HIGH (ref 0.61–1.24)
GFR, Estimated: 27 mL/min — ABNORMAL LOW (ref >60)
Glucose, Bld: 175 mg/dL — ABNORMAL HIGH (ref 70–99)
Potassium: 3.8 mmol/L (ref 3.5–5.1)
Sodium: 139 mmol/L (ref 135–145)
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
## 2022-04-14 NOTE — Telephone Encounter (Signed)
-----   Message from Theora Gianotti, NP sent at 04/14/2022 12:27 PM EDT ----- Kidney function slightly worse than last week.  Potassium normal.  Stop metolazone (he just took a dose yesterday) with plan to use no more than twice a week as needed for weight gain of 2 lbs in 24 hrs.  He should contact us if he's having to use metolazone more than once a week.  He should have a f/u bmet in 2 wks.

## 2022-04-14 NOTE — Patient Instructions (Addendum)
Medication Instructions:  No changes at this time.   *If you need a refill on your cardiac medications before your next appointment, please call your pharmacy*   Lab Work: BMET today over at the [employer] at [place] to 1st desk on the right to check in (REGISTRATION)  Lab hours: Monday- Friday (7:30 am- 5:30 pm)   If you have labs (blood work) drawn today and your tests are completely normal, you will receive your results only by: MyChart Message (if you have MyChart) OR A paper copy in the mail If you have any lab test that is abnormal or we need to change your treatment, we will call you to review the results.   Testing/Procedures: None   Follow-Up: At [facility], you and your health needs are our priority.  As part of our continuing mission to provide you with exceptional heart care, we have created designated Provider Care Teams.  These Care Teams include your primary Cardiologist (physician) and Advanced Practice Providers (APPs -  Physician Assistants and Nurse Practitioners) who all work together to provide you with the care you need, when you need it.  We recommend signing up for the patient portal called "MyChart".  Sign up information is provided on this After Visit Summary.  MyChart is used to connect with patients for Virtual Visits (Telemedicine).  Patients are able to view lab/test results, encounter notes, upcoming appointments, etc.  Non-urgent messages can be sent to your provider as well.   To learn more about what you can do with MyChart, go to [email or web address].    Your next appointment:   3 month(s)  The format for your next appointment:   In Person  Provider:   [name], MD or Murray Hodgkins, NP    Other Instructions Keep scheduled appointments with Heart Failure Clinic.  Darylene Price NP 05/10/22 at 12:30 pm 06/25/22 at 10:00 am  Important Information About Sugar

## 2022-04-14 NOTE — Telephone Encounter (Signed)
Left voicemail message to call back for review of results and medication recommendations.

## 2022-04-21 NOTE — Telephone Encounter (Signed)
Thank you for the follow-up.  Does he know when it was stopped?  At last visit, he indicated that he took a dose the day prior.  He should remain off of metolazone and we will plan for follow-up basic metabolic panel as previously outlined.

## 2022-04-21 NOTE — Telephone Encounter (Signed)
Spoke with patient and it was hard to understand him. He states when he was discharged from the hospital they put on his paperwork to stop it at that time. Instructed him to continue holding that medication and that we will see how his labs look. Advised to have repeat labs and he was agreeable with that plan.

## 2022-04-21 NOTE — Telephone Encounter (Signed)
Spoke with patient and reviewed results and recommendations. We discussed instructions for use of the metolazone and he repeated them back.   Patient then called back stating that someone already took him off of the metolazone. He was not able to recall who stopped that medication. Advised I would send to provider for his review and would call him back with further instructions. He verbalized understanding.

## 2022-04-21 NOTE — Telephone Encounter (Signed)
They stopped furosemide and started torsemide and metolazone at the time of discharge.  Regardless, he should continue torsemide and remain off of both furosemide and metolazone with follow-up labs as planned.

## 2022-04-22 NOTE — Telephone Encounter (Signed)
We discussed this information during previous call. No further needs and patient is aware to have repeat labs next week.

## 2022-04-29 ENCOUNTER — Other Ambulatory Visit
Admission: RE | Admit: 2022-04-29 | Discharge: 2022-04-29 | Disposition: A | Discharge status: Home / Self Care | Payer: Medicare HMO | Attending: Nurse Practitioner | Admitting: Nurse Practitioner

## 2022-04-29 DIAGNOSIS — N183 Chronic kidney disease, stage 3 unspecified: Secondary | ICD-10-CM | POA: Insufficient documentation

## 2022-04-29 DIAGNOSIS — I2510 Atherosclerotic heart disease of native coronary artery without angina pectoris: Secondary | ICD-10-CM | POA: Insufficient documentation

## 2022-04-29 DIAGNOSIS — I5042 Chronic combined systolic (congestive) and diastolic (congestive) heart failure: Secondary | ICD-10-CM

## 2022-04-29 DIAGNOSIS — I255 Ischemic cardiomyopathy: Secondary | ICD-10-CM | POA: Diagnosis present

## 2022-04-29 LAB — BASIC METABOLIC PANEL
Anion gap: 9 (ref 5–15)
BUN: 35 mg/dL — ABNORMAL HIGH (ref 8–23)
CO2: 24 mmol/L (ref 22–32)
Calcium: 8.6 mg/dL — ABNORMAL LOW (ref 8.9–10.3)
Chloride: 107 mmol/L (ref 98–111)
Creatinine, Ser: 1.82 mg/dL — ABNORMAL HIGH (ref 0.61–1.24)
GFR, Estimated: 36 mL/min — ABNORMAL LOW (ref >60)
Glucose, Bld: 157 mg/dL — ABNORMAL HIGH (ref 70–99)
Potassium: 3.6 mmol/L (ref 3.5–5.1)
Sodium: 140 mmol/L (ref 135–145)

## 2022-05-09 NOTE — Progress Notes (Unsigned)
Patient ID: [name], male    DOB: July 17, 1935, 87 y.o.   MRN: [id number]  HPI  [name] is a 87 y/o male with a history of heart block, CAD, DM, hyperlipidemia, HTN, pacemaker implantation, CKD, previous tobacco use and chronic heart failure.   Echo report from 01/05/22 reviewed and showed an EF of 30-35% along with mild LVH. Echo report from 08/27/21 reviewed and showed an EF of 35-40% along with mild LAE.   RHC/LHC done 08/31/21 & showed: Severe underlying three-vessel coronary artery disease with patent grafts including LIMA to LAD, SVG to large diagonal, sequential SVG to OM /distal left circumflex and SVG to right PDA. 2.  Right heart catheterization showed normal right and left-sided filling pressures, minimal pulmonary hypertension and normal cardiac output.  Admitted 03/28/22 due to acute onset of worsening dyspnea with associated dry cough and wheezing for the last week as well as associated lower extremity edema. BP elevated upon arrival to ED. Cardiology consult obtained. Initially given IV lasix with transition to oral diuretics. Elevated troponin thought to be due to demand ischemia. Discharged after 4 days. Was in the ED 03/24/22 due to  worsening shortness of breath, orthopnea and cough. Treated and released after good urine output.     Admitted 01/04/22 due to SOB due to acute on chronic HF. Initially given IV lasix with transition to oral diuretics. PT evaluation done. Discharged after 2 days.   He presents today for a follow-up visit with a chief complaint of minimal fatigue upon moderate exertion. Describes this as chronic in nature. He has associated shortness of breath, pedal edema (improving) and slight weight gain along with this. He denies any difficulty sleeping, dizziness, cough, chest pain, palpitations or abdominal distention.   When reviewing his medication bottles, he doesn't have entresto with him. He says that his wife fixes his pill box but he feels like he has  entresto at home.   Past Medical History:  Diagnosis Date   AV block, Mobitz 1    CAD (coronary artery disease)    a. 08/2019 VF arrest-->sev 3vd on cath-->CABGx5 (LIMA->LAD, VG->OM->LCX, VG->RPDA, VG->Diag); b. 08/2021 Cath: sev native multivessel dzs w/ 5/5 patent grafts. Nl filling pressures->Med rx.   CKD (chronic kidney disease), stage III (HCC)    Diabetes mellitus without complication ([place])    HFrEF (heart failure with reduced ejection fraction) ([name])    a. 08/2020 Echo: EF 40-45%; b. 11/2020 Echo: EF 55%, no rwma, mild LVH, Gr2 DD. nl RV fxn; c. 08/2021 Echo: EF 35-40%, glob HK w/ ant/antsept/apical HK. Reduced RV fxn. Mildly dil LA; d. 10/2021 s/p CRT-P; e. 12/2021 Echo: EF 30-35%, glob HK, GrIII DD. Mod red RV fxn, mild LAE, mild MR, AoV sclerosis.   Hyperlipidemia LDL goal <70    Hypertension    Intermittent Complete heart block ([name])    a. 08/2021 noted on Zio; b. 10/2021 s/p Abbott Allure RF CRT-P (Ser # [id number]).   Ischemic cardiomyopathy    a. 08/2020 Echo: EF 40-45%; b. 11/2020 Echo: EF 55%; c. 08/2021 Echo: EF 35-40%; d. 10/2021 s/p CRT-P; e. 12/2021 Echo: EF 30-35%, glob HK, GrIII DD.   Trifascicular block    Past Surgical History:  Procedure Laterality Date   BACK SURGERY     CARDIAC CATHETERIZATION     CLIPPING OF ATRIAL APPENDAGE N/A 08/29/2020   Procedure: CLIPPING OF ATRIAL APPENDAGE USING ATRICURE 78 MM ATRICLIP FLEX-V;  Surgeon: Duane Isaac, MD;  Location: [facility];  Service:  Open Heart Surgery;  Laterality: N/A;   CORONARY ARTERY BYPASS GRAFT N/A 08/29/2020   Procedure: CORONARY ARTERY BYPASS GRAFTING (CABG) X 5 USING LEFT INTERNAL MAMMARY ARTERY AND ENDOSCOPICALLY HARVESTED RIGHT GREATER SAPHENOUS VEIN. LIMA TO LAD, SVG TO OM SEQ TO CIRC, SVG TO PD, SVG TO DIAG.;  Surgeon: Duane Isaac, MD;  Location: [place];  Service: Open Heart Surgery;  Laterality: N/A;   ENDOVEIN HARVEST OF GREATER SAPHENOUS VEIN Right 08/29/2020   Procedure: ENDOVEIN HARVEST OF GREATER  SAPHENOUS VEIN;  Surgeon: Duane Isaac, MD;  Location: [place];  Service: Open Heart Surgery;  Laterality: Right;   HERNIA REPAIR     LEFT HEART CATH AND CORONARY ANGIOGRAPHY N/A 08/25/2020   Procedure: LEFT HEART CATH AND CORONARY ANGIOGRAPHY;  Surgeon: Duane Hampshire, MD;  Location: [facility];  Service: Cardiovascular;  Laterality: N/A;   PACEMAKER IMPLANT N/A 10/12/2021   Procedure: PACEMAKER IMPLANT;  Surgeon: Duane Epley, MD;  Location: [facility];  Service: Cardiovascular;  Laterality: N/A;   RIGHT/LEFT HEART CATH AND CORONARY ANGIOGRAPHY N/A 08/31/2021   Procedure: RIGHT/LEFT HEART CATH AND CORONARY ANGIOGRAPHY;  Surgeon: Duane Hampshire, MD;  Location: [facility];  Service: Cardiovascular;  Laterality: N/A;   TEE WITHOUT CARDIOVERSION N/A 08/29/2020   Procedure: TRANSESOPHAGEAL ECHOCARDIOGRAM (TEE);  Surgeon: Duane Isaac, MD;  Location: [facility];  Service: Open Heart Surgery;  Laterality: N/A;    Family History  Problem Relation Age of Onset   Heart attack Mother    Social History   Tobacco Use   Smoking status: Former    Types: Cigarettes    Quit date: 07/26/1996    Years since quitting: 25.8   Smokeless tobacco: Never  Substance Use Topics   Alcohol use: Not Currently   Allergies  Allergen Reactions   Angiotensin Receptor Blockers     hyperkalemia   Metformin Diarrhea   Spironolactone     Hyperkalemia    Prior to Admission medications   Medication Sig Start Date End Date Taking? Authorizing Provider  acyclovir (ZOVIRAX) 800 MG tablet Take 800 mg by mouth daily. 12/02/20  Yes [provider]  albuterol (VENTOLIN HFA) 108 (90 Base) MCG/ACT inhaler Inhale 2 puffs into the lungs every 6 (six) hours as needed for wheezing or shortness of breath. 09/30/21  Yes [provider]  carvedilol (COREG) 3.125 MG tablet Take 1 tablet (3.125 mg total) by mouth 2 (two) times daily with a meal. 11/12/21  Yes Duane Hampshire, MD  clopidogrel (PLAVIX) 75 MG tablet TAKE 1 TABLET BY MOUTH EVERY DAY Patient taking differently: Take 75 mg by mouth daily. 05/17/21  Yes Duane Dubonnet, NP  fluticasone (FLOVENT HFA) 110 MCG/ACT inhaler Inhale into the lungs as needed.   Yes [provider]  hydrALAZINE (APRESOLINE) 25 MG tablet Take 25 mg by mouth 2 times daily at 12 noon and 4 pm.   Yes [provider]  insulin detemir (LEVEMIR) 100 UNIT/ML injection Inject 0.15 mLs (15 Units total) into the skin at bedtime. 10/30/21  Yes Enzo Bi, MD  isosorbide mononitrate (IMDUR) 30 MG 24 hr tablet Take 1 tablet (30 mg total) by mouth daily. 11/25/21  Yes Darylene Price A, FNP  magnesium oxide (MAG-OX) 400 MG tablet Take 1 tablet (400 mg total) by mouth daily. 01/20/22  Yes Duane Epley, MD  pantoprazole (PROTONIX) 40 MG tablet Take 40 mg by mouth 2 (two) times daily.   Yes  [provider]  potassium chloride SA (KLOR-CON M) 20 MEQ tablet Take 1 tablet (20 mEq total) by mouth daily. 01/28/22  Yes Dashonda Bonneau, Otila Kluver A, FNP  rosuvastatin (CRESTOR) 40 MG tablet TAKE 1 TABLET BY MOUTH EVERY DAY Patient taking differently: Take 40 mg by mouth daily. 12/14/21  Yes Duane Hampshire, MD  torsemide 40 MG TABS Take 40 mg by mouth 2 (two) times daily. 04/01/22  Yes Nolberto Hanlon, MD  ENTRESTO 24-26 MG Take 1 tablet by mouth 2 (two) times daily. Patient not taking: Reported on 05/10/2022 02/24/22   [provider]  metolazone (ZAROXOLYN) 2.5 MG tablet Take 1 tablet (2.5 mg total) by mouth once a week. Patient not taking: Reported on 05/10/2022 04/08/22   Alisa Graff, FNP   Review of Systems  Constitutional:  Positive for fatigue. Negative for appetite change.  HENT:  Negative for congestion, postnasal drip and sneezing.   Eyes: Negative.   Respiratory:  Positive for shortness of breath ("very little). Negative for cough and chest tightness.   Cardiovascular:  Positive for leg swelling (iimproving). Negative for  chest pain and palpitations.  Gastrointestinal:  Negative for abdominal distention and abdominal pain.  Endocrine: Negative.   Genitourinary: Negative.   Musculoskeletal:  Negative for back pain and neck pain.  Skin: Negative.   Allergic/Immunologic: Negative.   Neurological:  Negative for dizziness and light-headedness.  Hematological:  Negative for adenopathy. Does not bruise/bleed easily.  Psychiatric/Behavioral:  Negative for dysphoric mood and sleep disturbance (sleeping on 1 pillows). The patient is not nervous/anxious.    Vitals:   05/10/22 1243  BP: (!) 142/78  Pulse: 63  Resp: 18  SpO2: 100%  Weight: 224 lb (101.6 kg)  Height: '5\' 8"'$  (1.727 m)   Wt Readings from Last 3 Encounters:  05/10/22 224 lb (101.6 kg)  04/14/22 219 lb 12.8 oz (99.7 kg)  04/07/22 221 lb 6 oz (100.4 kg)   Lab Results  Component Value Date   CREATININE 1.82 (H) 04/29/2022   CREATININE 2.32 (H) 04/14/2022   CREATININE 2.28 (H) 04/07/2022   Physical Exam Vitals and nursing note reviewed.  Constitutional:      Appearance: He is well-developed.  HENT:     Head: Normocephalic and atraumatic.  Neck:     Vascular: No JVD.  Cardiovascular:     Rate and Rhythm: Normal rate. Rhythm irregular.  Pulmonary:     Effort: Pulmonary effort is normal. No tachypnea or accessory muscle usage.  Abdominal:     Palpations: Abdomen is soft.     Tenderness: There is no abdominal tenderness.  Musculoskeletal:        General: No tenderness.     Cervical back: Neck supple.     Right lower leg: No tenderness. Edema (trace pitting) present.     Left lower leg: No tenderness. Edema (trace pitting) present.  Skin:    General: Skin is warm and dry.  Neurological:     General: No focal deficit present.     Mental Status: He is alert and oriented to person, place, and time.  Psychiatric:        Mood and Affect: Mood normal.        Behavior: Behavior normal.    Assessment & Plan:  1: Chronic heart failure with  reduced ejection fraction- - NYHA class II - euvolemic today - weighing daily; reminded to call for an overnight weight gain of >2 pounds or a weekly weight gain of > 5 pounds -  weight up 3 pounds from last visit here 1 month ago - not adding salt and he says that his wife doesn't cook with salt either - had pacemaker implanted 10/12/21 - had hyperkalemia with spironolactone - on GDMT of carvedilol & entresto although he doesn't have his entresto bottle with him - wrote on his AVS to make sure he has entresto at home and if not, to let me know so I can call it in - no longer taking metolazone - BNP 03/28/22 was 3468.6  2: HTN with CKD- - BP mildly elevated (142/78) - saw PCP 01/11/22 - BMP 04/29/22 reviewed and showed sodium 140, potassium 3.6, creatinine 1.82 and GFR 36 - will check labs next visit  3: Type 2 DM- - A1c 10/28/21 was 6.8%  4: CAD- - saw cardiology Sharolyn Douglas) 04/14/22; returns September  5: Lymphedema-  - stage 2 - tries to elevate legs when sitting for long periods of time - wearing compression socks daily - edema looks good   Mediation bottles reviewed.   Keep already scheduled appointment for next month.

## 2022-05-10 ENCOUNTER — Encounter: Payer: Self-pay | Admitting: Family

## 2022-05-10 ENCOUNTER — Telehealth: Payer: Self-pay | Admitting: Cardiovascular Disease

## 2022-05-10 ENCOUNTER — Ambulatory Visit: Payer: Medicare HMO | Attending: Family | Admitting: Family

## 2022-05-10 VITALS — BP 142/78 | HR 63 | Resp 18 | Ht 68.0 in | Wt 224.0 lb

## 2022-05-10 DIAGNOSIS — Z950 Presence of cardiac pacemaker: Secondary | ICD-10-CM | POA: Diagnosis not present

## 2022-05-10 DIAGNOSIS — E785 Hyperlipidemia, unspecified: Secondary | ICD-10-CM | POA: Diagnosis not present

## 2022-05-10 DIAGNOSIS — I5022 Chronic systolic (congestive) heart failure: Secondary | ICD-10-CM | POA: Diagnosis not present

## 2022-05-10 DIAGNOSIS — Z7901 Long term (current) use of anticoagulants: Secondary | ICD-10-CM | POA: Insufficient documentation

## 2022-05-10 DIAGNOSIS — Z79899 Other long term (current) drug therapy: Secondary | ICD-10-CM | POA: Insufficient documentation

## 2022-05-10 DIAGNOSIS — E1122 Type 2 diabetes mellitus with diabetic chronic kidney disease: Secondary | ICD-10-CM | POA: Insufficient documentation

## 2022-05-10 DIAGNOSIS — I890 Lymphedema, not elsewhere classified: Secondary | ICD-10-CM | POA: Insufficient documentation

## 2022-05-10 DIAGNOSIS — I10 Essential (primary) hypertension: Secondary | ICD-10-CM

## 2022-05-10 DIAGNOSIS — I2510 Atherosclerotic heart disease of native coronary artery without angina pectoris: Secondary | ICD-10-CM | POA: Insufficient documentation

## 2022-05-10 DIAGNOSIS — N183 Chronic kidney disease, stage 3 unspecified: Secondary | ICD-10-CM | POA: Diagnosis not present

## 2022-05-10 DIAGNOSIS — I130 Hypertensive heart and chronic kidney disease with heart failure and stage 1 through stage 4 chronic kidney disease, or unspecified chronic kidney disease: Secondary | ICD-10-CM | POA: Insufficient documentation

## 2022-05-10 DIAGNOSIS — Z8674 Personal history of sudden cardiac arrest: Secondary | ICD-10-CM | POA: Insufficient documentation

## 2022-05-10 DIAGNOSIS — R0602 Shortness of breath: Secondary | ICD-10-CM | POA: Insufficient documentation

## 2022-05-10 DIAGNOSIS — Z794 Long term (current) use of insulin: Secondary | ICD-10-CM

## 2022-05-10 DIAGNOSIS — N1832 Chronic kidney disease, stage 3b: Secondary | ICD-10-CM

## 2022-05-10 NOTE — Telephone Encounter (Signed)
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

## 2022-05-10 NOTE — Telephone Encounter (Signed)
Forms received and placed on Ignacia Bayley NP desk for his review and completion.

## 2022-05-10 NOTE — Patient Instructions (Addendum)
Continue weighing daily and call for an overnight weight gain of 3 pounds or more or a weekly weight gain of more than 5 pounds.   If you have voicemail, please make sure your mailbox is cleaned out so that we may leave a message and please make sure to listen to any voicemails.     Make sure you have the entresto at home

## 2022-05-11 ENCOUNTER — Encounter: Payer: Self-pay | Admitting: Family

## 2022-05-17 NOTE — Telephone Encounter (Signed)
Patient came by office to check on status  Please call when completed

## 2022-05-18 NOTE — Telephone Encounter (Signed)
Forms were placed on your desk in red folder back on 05/10/22 and they are calling to check the status on them.

## 2022-05-18 NOTE — Telephone Encounter (Signed)
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

## 2022-05-20 NOTE — Telephone Encounter (Signed)
Left voicemail message to call back regarding his forms.

## 2022-05-24 NOTE — Telephone Encounter (Signed)
Cardiology portion of his forms were completed and handed to him personally by myself. No further needs.

## 2022-06-10 ENCOUNTER — Other Ambulatory Visit: Payer: Self-pay | Admitting: Family

## 2022-06-25 ENCOUNTER — Other Ambulatory Visit: Payer: Self-pay | Admitting: Family

## 2022-06-25 ENCOUNTER — Telehealth: Payer: Self-pay | Admitting: Family

## 2022-06-25 ENCOUNTER — Ambulatory Visit (HOSPITAL_BASED_OUTPATIENT_CLINIC_OR_DEPARTMENT_OTHER): Payer: Medicare HMO | Admitting: Family

## 2022-06-25 ENCOUNTER — Other Ambulatory Visit
Admission: RE | Admit: 2022-06-25 | Discharge: 2022-06-25 | Disposition: A | Discharge status: Home / Self Care | Payer: Medicare HMO | Source: Ambulatory Visit | Attending: Family | Admitting: Family

## 2022-06-25 ENCOUNTER — Encounter: Payer: Self-pay | Admitting: Family

## 2022-06-25 VITALS — BP 120/77 | HR 65 | Resp 20 | Ht 68.0 in | Wt 215.0 lb

## 2022-06-25 DIAGNOSIS — I5022 Chronic systolic (congestive) heart failure: Secondary | ICD-10-CM | POA: Insufficient documentation

## 2022-06-25 DIAGNOSIS — I2510 Atherosclerotic heart disease of native coronary artery without angina pectoris: Secondary | ICD-10-CM | POA: Diagnosis not present

## 2022-06-25 DIAGNOSIS — N1832 Chronic kidney disease, stage 3b: Secondary | ICD-10-CM

## 2022-06-25 DIAGNOSIS — E1122 Type 2 diabetes mellitus with diabetic chronic kidney disease: Secondary | ICD-10-CM

## 2022-06-25 DIAGNOSIS — I890 Lymphedema, not elsewhere classified: Secondary | ICD-10-CM

## 2022-06-25 DIAGNOSIS — I10 Essential (primary) hypertension: Secondary | ICD-10-CM | POA: Diagnosis not present

## 2022-06-25 DIAGNOSIS — Z794 Long term (current) use of insulin: Secondary | ICD-10-CM

## 2022-06-25 DIAGNOSIS — N179 Acute kidney failure, unspecified: Secondary | ICD-10-CM

## 2022-06-25 LAB — BASIC METABOLIC PANEL
Anion gap: 13 (ref 5–15)
BUN: 61 mg/dL — ABNORMAL HIGH (ref 8–23)
CO2: 23 mmol/L (ref 22–32)
Calcium: 8.5 mg/dL — ABNORMAL LOW (ref 8.9–10.3)
Chloride: 100 mmol/L (ref 98–111)
Creatinine, Ser: 3.44 mg/dL — ABNORMAL HIGH (ref 0.61–1.24)
GFR, Estimated: 17 mL/min — ABNORMAL LOW (ref >60)
Glucose, Bld: 301 mg/dL — ABNORMAL HIGH (ref 70–99)
Potassium: 3.5 mmol/L (ref 3.5–5.1)
Sodium: 136 mmol/L (ref 135–145)

## 2022-06-25 NOTE — Patient Instructions (Addendum)
Continue weighing daily and call for an overnight weight gain of 3 pounds or more or a weekly weight gain of more than 5 pounds.    If you have voicemail, please make sure your mailbox is cleaned out so that we may leave a message and please make sure to listen to any voicemails.     Thank you for bringing your medications today, keep up the great work!

## 2022-06-25 NOTE — Progress Notes (Signed)
BMP to be checked due to AKI

## 2022-06-25 NOTE — Progress Notes (Signed)
Patient ID: [name], male    DOB: 10/30/1935, 87 y.o.   MRN: [id number]  HPI  [name] is a 87 y/o male with a history of heart block, CAD, DM, hyperlipidemia, HTN, pacemaker implantation, CKD, previous tobacco use and chronic heart failure.   Echo report from 01/05/22 reviewed and showed an EF of 30-35% along with mild LVH. Echo report from 08/27/21 reviewed and showed an EF of 35-40% along with mild LAE.   RHC/LHC done 08/31/21 & showed: Severe underlying three-vessel coronary artery disease with patent grafts including LIMA to LAD, SVG to large diagonal, sequential SVG to OM /distal left circumflex and SVG to right PDA. 2.  Right heart catheterization showed normal right and left-sided filling pressures, minimal pulmonary hypertension and normal cardiac output.  Admitted 03/28/22 due to acute onset of worsening dyspnea with associated dry cough and wheezing for the last week as well as associated lower extremity edema. BP elevated upon arrival to ED. Cardiology consult obtained. Initially given IV lasix with transition to oral diuretics. Elevated troponin thought to be due to demand ischemia. Discharged after 4 days. Was in the ED 03/24/22 due to  worsening shortness of breath, orthopnea and cough. Treated and released after good urine output. Admitted 01/04/22 due to SOB due to acute on chronic HF. Initially given IV lasix with transition to oral diuretics. PT evaluation done. Discharged after 2 days.   He presents today for a follow-up visit with a chief complaint of minimal fatigue with moderate exertion. Describes this as chronic in nature. He has associated shortness of breath and pedal edema along with this. He denies any dizziness, difficulty sleeping, abdominal distention, palpitations, chest pain, cough or weight gain.   Overall he says that he feels "great" and the "best I've felt in a long time".   Weighing daily but doesn't think his scale is accurate.   Past Medical History:   Diagnosis Date   AV block, Mobitz 1    CAD (coronary artery disease)    a. 08/2019 VF arrest-->sev 3vd on cath-->CABGx5 (LIMA->LAD, VG->OM->LCX, VG->RPDA, VG->Diag); b. 08/2021 Cath: sev native multivessel dzs w/ 5/5 patent grafts. Nl filling pressures->Med rx.   CKD (chronic kidney disease), stage III (HCC)    Diabetes mellitus without complication ([place])    HFrEF (heart failure with reduced ejection fraction) ([place])    a. 08/2020 Echo: EF 40-45%; b. 11/2020 Echo: EF 55%, no rwma, mild LVH, Gr2 DD. nl RV fxn; c. 08/2021 Echo: EF 35-40%, glob HK w/ ant/antsept/apical HK. Reduced RV fxn. Mildly dil LA; d. 10/2021 s/p CRT-P; e. 12/2021 Echo: EF 30-35%, glob HK, GrIII DD. Mod red RV fxn, mild LAE, mild Duane, AoV sclerosis.   Hyperlipidemia LDL goal <70    Hypertension    Intermittent Complete heart block ([place])    a. 08/2021 noted on Zio; b. 10/2021 s/p Abbott Allure RF CRT-P (Ser # [id number]).   Ischemic cardiomyopathy    a. 08/2020 Echo: EF 40-45%; b. 11/2020 Echo: EF 55%; c. 08/2021 Echo: EF 35-40%; d. 10/2021 s/p CRT-P; e. 12/2021 Echo: EF 30-35%, glob HK, GrIII DD.   Trifascicular block    Past Surgical History:  Procedure Laterality Date   BACK SURGERY     CARDIAC CATHETERIZATION     CLIPPING OF ATRIAL APPENDAGE N/A 08/29/2020   Procedure: CLIPPING OF ATRIAL APPENDAGE USING ATRICURE 52 MM ATRICLIP FLEX-V;  Surgeon: Grace Isaac, MD;  Location: [facility];  Service: Open Heart Surgery;  Laterality: N/A;  CORONARY ARTERY BYPASS GRAFT N/A 08/29/2020   Procedure: CORONARY ARTERY BYPASS GRAFTING (CABG) X 5 USING LEFT INTERNAL MAMMARY ARTERY AND ENDOSCOPICALLY HARVESTED RIGHT GREATER SAPHENOUS VEIN. LIMA TO LAD, SVG TO OM SEQ TO CIRC, SVG TO PD, SVG TO DIAG.;  Surgeon: Grace Isaac, MD;  Location: [facility];  Service: Open Heart Surgery;  Laterality: N/A;   ENDOVEIN HARVEST OF GREATER SAPHENOUS VEIN Right 08/29/2020   Procedure: ENDOVEIN HARVEST OF GREATER SAPHENOUS VEIN;  Surgeon: Grace Isaac, MD;  Location: [facility];  Service: Open Heart Surgery;  Laterality: Right;   HERNIA REPAIR     LEFT HEART CATH AND CORONARY ANGIOGRAPHY N/A 08/25/2020   Procedure: LEFT HEART CATH AND CORONARY ANGIOGRAPHY;  Surgeon: [name], MD;  Location: [facility];  Service: Cardiovascular;  Laterality: N/A;   PACEMAKER IMPLANT N/A 10/12/2021   Procedure: PACEMAKER IMPLANT;  Surgeon: Vickie Epley, MD;  Location: [facility];  Service: Cardiovascular;  Laterality: N/A;   RIGHT/LEFT HEART CATH AND CORONARY ANGIOGRAPHY N/A 08/31/2021   Procedure: RIGHT/LEFT HEART CATH AND CORONARY ANGIOGRAPHY;  Surgeon: [name], MD;  Location: [facility];  Service: Cardiovascular;  Laterality: N/A;   TEE WITHOUT CARDIOVERSION N/A 08/29/2020   Procedure: TRANSESOPHAGEAL ECHOCARDIOGRAM (TEE);  Surgeon: Grace Isaac, MD;  Location: [facility];  Service: Open Heart Surgery;  Laterality: N/A;    Family History  Problem Relation Age of Onset   Heart attack Mother    Social History   Tobacco Use   Smoking status: Former    Types: Cigarettes    Quit date: 07/26/1996    Years since quitting: 25.9   Smokeless tobacco: Never  Substance Use Topics   Alcohol use: Not Currently   Allergies  Allergen Reactions   Angiotensin Receptor Blockers     hyperkalemia   Metformin Diarrhea   Spironolactone     Hyperkalemia    Prior to Admission medications   Medication Sig Start Date End Date Taking? Authorizing Provider  acyclovir (ZOVIRAX) 800 MG tablet Take 800 mg by mouth daily. 12/02/20  Yes [provider]  albuterol (VENTOLIN HFA) 108 (90 Base) MCG/ACT inhaler Inhale 2 puffs into the lungs every 6 (six) hours as needed for wheezing or shortness of breath. 09/30/21  Yes [provider]  carvedilol (COREG) 3.125 MG tablet Take 1 tablet (3.125 mg total) by mouth 2 (two) times daily with a meal. 11/12/21  Yes [name], MD  clopidogrel (PLAVIX) 75 MG tablet  TAKE 1 TABLET BY MOUTH EVERY DAY Patient taking differently: Take 75 mg by mouth daily. 05/17/21  Yes Loel Dubonnet, NP  dapagliflozin propanediol (FARXIGA) 10 MG TABS tablet Take 10 mg by mouth daily.   Yes [provider]  ENTRESTO 24-26 MG Take 1 tablet by mouth 2 (two) times daily. 02/24/22  Yes [provider]  fluticasone (FLOVENT HFA) 110 MCG/ACT inhaler Inhale into the lungs as needed.   Yes [provider]  hydrALAZINE (APRESOLINE) 25 MG tablet Take 25 mg by mouth 2 times daily at 12 noon and 4 pm.   Yes [provider]  insulin detemir (LEVEMIR) 100 UNIT/ML injection Inject 0.15 mLs (15 Units total) into the skin at bedtime. 10/30/21  Yes Enzo Bi, MD  isosorbide mononitrate (IMDUR) 30 MG 24 hr tablet Take 1 tablet (30 mg total) by mouth daily. 11/25/21  Yes Jasmin Trumbull, Otila Kluver A, FNP  magnesium oxide (MAG-OX) 400 MG tablet Take 1 tablet (  400 mg total) by mouth daily. 01/20/22  Yes Vickie Epley, MD  metolazone (ZAROXOLYN) 2.5 MG tablet Take 1 tablet (2.5 mg total) by mouth once a week. 04/08/22  Yes Cianna Kasparian A, FNP  pantoprazole (PROTONIX) 40 MG tablet Take 40 mg by mouth 2 (two) times daily.   Yes [provider]  potassium chloride SA (KLOR-CON M) 20 MEQ tablet Take 1 tablet (20 mEq total) by mouth daily. 01/28/22  Yes Anaja Monts, Otila Kluver A, FNP  rosuvastatin (CRESTOR) 40 MG tablet TAKE 1 TABLET BY MOUTH EVERY DAY Patient taking differently: Take 40 mg by mouth daily. 12/14/21  Yes [name], MD  torsemide 40 MG TABS Take 40 mg by mouth 2 (two) times daily. 04/01/22  Yes Nolberto Hanlon, MD    Review of Systems  Constitutional:  Positive for fatigue. Negative for appetite change.  HENT:  Negative for congestion, postnasal drip and sneezing.   Eyes: Negative.   Respiratory:  Positive for shortness of breath ("very little). Negative for cough and chest tightness.   Cardiovascular:  Positive for leg swelling (iimproving). Negative for chest  pain and palpitations.  Gastrointestinal:  Negative for abdominal distention and abdominal pain.  Endocrine: Negative.   Genitourinary: Negative.   Musculoskeletal:  Negative for back pain and neck pain.  Skin: Negative.   Allergic/Immunologic: Negative.   Neurological:  Negative for dizziness and light-headedness.  Hematological:  Negative for adenopathy. Does not bruise/bleed easily.  Psychiatric/Behavioral:  Negative for dysphoric mood and sleep disturbance (sleeping on 1 pillows). The patient is not nervous/anxious.    Vitals:   06/25/22 1005  BP: 120/77  Pulse: 65  Resp: 20  SpO2: 98%  Weight: 215 lb (97.5 kg)  Height: '5\' 8"'$  (1.727 m)   Wt Readings from Last 3 Encounters:  06/25/22 215 lb (97.5 kg)  05/10/22 224 lb (101.6 kg)  04/14/22 219 lb 12.8 oz (99.7 kg)   Lab Results  Component Value Date   CREATININE 3.44 (H) 06/25/2022   CREATININE 1.82 (H) 04/29/2022   CREATININE 2.32 (H) 04/14/2022   Physical Exam Vitals and nursing note reviewed.  Constitutional:      Appearance: He is well-developed.  HENT:     Head: Normocephalic and atraumatic.  Neck:     Vascular: No JVD.  Cardiovascular:     Rate and Rhythm: Normal rate. Rhythm irregular.  Pulmonary:     Effort: Pulmonary effort is normal. No tachypnea or accessory muscle usage.  Abdominal:     Palpations: Abdomen is soft.     Tenderness: There is no abdominal tenderness.  Musculoskeletal:        General: No tenderness.     Cervical back: Neck supple.     Right lower leg: No tenderness. Edema (trace pitting) present.     Left lower leg: No tenderness. Edema (trace pitting) present.  Skin:    General: Skin is warm and dry.  Neurological:     General: No focal deficit present.     Mental Status: He is alert and oriented to person, place, and time.  Psychiatric:        Mood and Affect: Mood normal.        Behavior: Behavior normal.    Assessment & Plan:  1: Chronic heart failure with reduced ejection  fraction- - NYHA class II - euvolemic today - weighing daily but he doesn't think his scale is accurate so a set was given to him today; reminded to call for an overnight weight  gain of >2 pounds or a weekly weight gain of > 5 pounds - weight down 9 pounds from last visit here 6 weeks ago - not adding salt and he says that his wife doesn't cook with salt either - had pacemaker implanted 10/12/21 - had hyperkalemia with spironolactone - on GDMT of carvedilol & entresto  - taking metolazone weekly but unsure why; at previous visit he wasn't taking the metolazone - will check BMP today - BNP 03/28/22 was 3468.6  2: HTN with CKD- - BP looks good (120/77) - saw PCP 06/04/22 - BMP 04/29/22 reviewed and showed sodium 140, potassium 3.6, creatinine 1.82 and GFR 36 - will check labs today  3: Type 2 DM- - A1c 10/28/21 was 6.8% - home glucose running 120-140's  4: CAD- - saw cardiology Sharolyn Douglas) 04/14/22; returns September  5: Lymphedema-  - stage 2 - tries to elevate legs when sitting for long periods of time - wearing compression socks daily - edema looks good   Mediation bottles reviewed.   Return in 3 months, sooner if needed and depending on lab results.   Keep already scheduled appointment for next month.

## 2022-06-25 NOTE — Telephone Encounter (Signed)
Spoke with patient and his wife regarding his lab work obtained earlier today after consulting with his cardiology group Sharolyn Douglas).   Potassium level is normal but renal function has worsened considerably in the last 2 months with a BUN 61, creatinine 3.44 and GFR 17.   At [place] earlier today, patient was hemodynamically stable, with clear lungs and no abdominal distention/ pedal edema.   Advised patient to HOLD his torsemide, metolazone and entresto through the weekend. Will recheck lab work on Monday 06/28/22 and re-assess. Wife wrote the medication names down and both patient and his wife verbalized understanding of the medications to hold.

## 2022-06-28 ENCOUNTER — Other Ambulatory Visit: Payer: Self-pay | Admitting: Family

## 2022-06-28 ENCOUNTER — Telehealth: Payer: Self-pay

## 2022-06-28 ENCOUNTER — Encounter
Admission: RE | Admit: 2022-06-28 | Discharge: 2022-06-28 | Disposition: A | Discharge status: Home / Self Care | Payer: Medicare HMO | Source: Ambulatory Visit | Attending: Family | Admitting: Family

## 2022-06-28 DIAGNOSIS — N179 Acute kidney failure, unspecified: Secondary | ICD-10-CM | POA: Insufficient documentation

## 2022-06-28 DIAGNOSIS — Z01812 Encounter for preprocedural laboratory examination: Secondary | ICD-10-CM | POA: Insufficient documentation

## 2022-06-28 DIAGNOSIS — I5022 Chronic systolic (congestive) heart failure: Secondary | ICD-10-CM

## 2022-06-28 LAB — BASIC METABOLIC PANEL
Anion gap: 10 (ref 5–15)
BUN: 68 mg/dL — ABNORMAL HIGH (ref 8–23)
CO2: 24 mmol/L (ref 22–32)
Calcium: 8.1 mg/dL — ABNORMAL LOW (ref 8.9–10.3)
Chloride: 104 mmol/L (ref 98–111)
Creatinine, Ser: 2.92 mg/dL — ABNORMAL HIGH (ref 0.61–1.24)
GFR, Estimated: 21 mL/min — ABNORMAL LOW (ref >60)
Glucose, Bld: 207 mg/dL — ABNORMAL HIGH (ref 70–99)
Potassium: 3.2 mmol/L — ABNORMAL LOW (ref 3.5–5.1)
Sodium: 138 mmol/L (ref 135–145)

## 2022-06-28 MED ORDER — POTASSIUM CHLORIDE CRYS ER 20 MEQ PO TBCR: 40.0000 meq | EXTENDED_RELEASE_TABLET | Freq: Every day | ORAL | 5 refills | Status: DC

## 2022-06-28 NOTE — Telephone Encounter (Addendum)
Spoke with patient and reviewed lab results and medication changes below. Patient verbalized understanding to continue holding torsemide, metolazone and entresto if weight and swelling are stable, and to increase potassium to 2 tablets a day until we recheck labs on Friday.  Patient states he can be here for labwork in PAT at 10 AM Friday morning, and understands we will call with follow up instructions when we get those lab results Friday afternoon. He had no further questions or concerns at this time. Georg Ruddle, RN ----- Message from Alisa Graff, [place] sent at 06/28/2022  1:30 PM EDT ----- Renal function is improving. Continue to hold torsemide, metolazone and entresto as long as weight/ edema are stable. Potassium is a little low so increase your potassium to 2 tablets every morning. We need to recheck your labs on Friday morning.

## 2022-07-02 ENCOUNTER — Encounter
Admission: RE | Admit: 2022-07-02 | Discharge: 2022-07-02 | Disposition: A | Discharge status: Home / Self Care | Payer: Medicare HMO | Source: Ambulatory Visit | Attending: Family | Admitting: Family

## 2022-07-02 ENCOUNTER — Telehealth: Payer: Self-pay | Admitting: Family

## 2022-07-02 DIAGNOSIS — Z01818 Encounter for other preprocedural examination: Secondary | ICD-10-CM | POA: Diagnosis present

## 2022-07-02 DIAGNOSIS — I5022 Chronic systolic (congestive) heart failure: Secondary | ICD-10-CM | POA: Insufficient documentation

## 2022-07-02 LAB — BASIC METABOLIC PANEL
Anion gap: 4 — ABNORMAL LOW (ref 5–15)
BUN: 34 mg/dL — ABNORMAL HIGH (ref 8–23)
CO2: 22 mmol/L (ref 22–32)
Calcium: 8.5 mg/dL — ABNORMAL LOW (ref 8.9–10.3)
Chloride: 114 mmol/L — ABNORMAL HIGH (ref 98–111)
Creatinine, Ser: 2.14 mg/dL — ABNORMAL HIGH (ref 0.61–1.24)
GFR, Estimated: 30 mL/min — ABNORMAL LOW (ref >60)
Glucose, Bld: 165 mg/dL — ABNORMAL HIGH (ref 70–99)
Potassium: 4.2 mmol/L (ref 3.5–5.1)
Sodium: 140 mmol/L (ref 135–145)

## 2022-07-02 NOTE — Telephone Encounter (Signed)
Spoke with patient regarding BMP results obtained earlier today. Renal function is improving. Patient says that his weight is stable at 216 pounds and denies any swelling in his legs.   Will continue to hold his entresto, metolazone and torsemide. Will see patient in clinic on 07/07/22 and recheck labs at that time. He verbalized understanding.

## 2022-07-07 ENCOUNTER — Ambulatory Visit (HOSPITAL_BASED_OUTPATIENT_CLINIC_OR_DEPARTMENT_OTHER): Payer: Medicare HMO | Admitting: Family

## 2022-07-07 ENCOUNTER — Other Ambulatory Visit
Admission: RE | Admit: 2022-07-07 | Discharge: 2022-07-07 | Disposition: A | Discharge status: Home / Self Care | Payer: Medicare HMO | Source: Ambulatory Visit | Attending: Family | Admitting: Family

## 2022-07-07 ENCOUNTER — Other Ambulatory Visit: Payer: Self-pay | Admitting: Family

## 2022-07-07 ENCOUNTER — Encounter: Payer: Self-pay | Admitting: Family

## 2022-07-07 ENCOUNTER — Encounter: Payer: Self-pay | Admitting: Pharmacist

## 2022-07-07 VITALS — BP 151/78 | HR 63 | Resp 18 | Ht 68.0 in | Wt 228.0 lb

## 2022-07-07 DIAGNOSIS — I5022 Chronic systolic (congestive) heart failure: Secondary | ICD-10-CM | POA: Insufficient documentation

## 2022-07-07 DIAGNOSIS — E1122 Type 2 diabetes mellitus with diabetic chronic kidney disease: Secondary | ICD-10-CM | POA: Insufficient documentation

## 2022-07-07 DIAGNOSIS — I10 Essential (primary) hypertension: Secondary | ICD-10-CM

## 2022-07-07 DIAGNOSIS — I890 Lymphedema, not elsewhere classified: Secondary | ICD-10-CM | POA: Insufficient documentation

## 2022-07-07 DIAGNOSIS — E875 Hyperkalemia: Secondary | ICD-10-CM | POA: Insufficient documentation

## 2022-07-07 DIAGNOSIS — I2510 Atherosclerotic heart disease of native coronary artery without angina pectoris: Secondary | ICD-10-CM | POA: Insufficient documentation

## 2022-07-07 DIAGNOSIS — I130 Hypertensive heart and chronic kidney disease with heart failure and stage 1 through stage 4 chronic kidney disease, or unspecified chronic kidney disease: Secondary | ICD-10-CM | POA: Insufficient documentation

## 2022-07-07 DIAGNOSIS — Z794 Long term (current) use of insulin: Secondary | ICD-10-CM

## 2022-07-07 DIAGNOSIS — N1832 Chronic kidney disease, stage 3b: Secondary | ICD-10-CM

## 2022-07-07 DIAGNOSIS — I272 Pulmonary hypertension, unspecified: Secondary | ICD-10-CM | POA: Insufficient documentation

## 2022-07-07 DIAGNOSIS — R062 Wheezing: Secondary | ICD-10-CM | POA: Insufficient documentation

## 2022-07-07 DIAGNOSIS — Z950 Presence of cardiac pacemaker: Secondary | ICD-10-CM | POA: Insufficient documentation

## 2022-07-07 LAB — BASIC METABOLIC PANEL
Anion gap: 6 (ref 5–15)
BUN: 27 mg/dL — ABNORMAL HIGH (ref 8–23)
CO2: 23 mmol/L (ref 22–32)
Calcium: 9 mg/dL (ref 8.9–10.3)
Chloride: 111 mmol/L (ref 98–111)
Creatinine, Ser: 2.05 mg/dL — ABNORMAL HIGH (ref 0.61–1.24)
GFR, Estimated: 31 mL/min — ABNORMAL LOW (ref >60)
Glucose, Bld: 131 mg/dL — ABNORMAL HIGH (ref 70–99)
Potassium: 4.7 mmol/L (ref 3.5–5.1)
Sodium: 140 mmol/L (ref 135–145)

## 2022-07-07 NOTE — Progress Notes (Signed)
Patient ID: Ryszard Socarras, male    DOB: Sep 10, 1935, 87 y.o.   MRN: [id number]  HPI  Mr Dirocco is a 87 y/o male with a history of heart block, CAD, DM, hyperlipidemia, HTN, pacemaker implantation, CKD, previous tobacco use and chronic heart failure.   Echo report from 01/05/22 reviewed and showed an EF of 30-35% along with mild LVH. Echo report from 08/27/21 reviewed and showed an EF of 35-40% along with mild LAE.   RHC/LHC done 08/31/21 & showed: 1.  Severe underlying three-vessel coronary artery disease with patent grafts including LIMA to LAD, SVG to large diagonal, sequential SVG to OM /distal left circumflex and SVG to right PDA. 2.  Right heart catheterization showed normal right and left-sided filling pressures, minimal pulmonary hypertension and normal cardiac output.  Admitted 03/28/22 due to acute onset of worsening dyspnea with associated dry cough and wheezing for the last week as well as associated lower extremity edema. BP elevated upon arrival to ED. Cardiology consult obtained. Initially given IV lasix with transition to oral diuretics. Elevated troponin thought to be due to demand ischemia. Discharged after 4 days. Was in the ED 03/24/22 due to  worsening shortness of breath, orthopnea and cough. Treated and released after good urine output. Admitted 01/04/22 due to SOB due to acute on chronic HF. Initially given IV lasix with transition to oral diuretics. PT evaluation done. Discharged after 2 days.   He presents today for a follow-up visit with no complaints. Denies fatigue, headaches, dizziness, cough, SOB, chest pain/pressure, abdominal pain/distention, constipation, diarrhea, nor swelling in lower extremities.   Following low-sodium diet and monitoring weight daily. Monitors blood sugars daily every morning says it was 120 this AM. Wears compression hoses daily but not wearing today at visit.   Past Medical History:  Diagnosis Date   AV block, Mobitz 1    CAD (coronary artery  disease)    a. 08/2019 VF arrest-->sev 3vd on cath-->CABGx5 (LIMA->LAD, VG->OM->LCX, VG->RPDA, VG->Diag); b. 08/2021 Cath: sev native multivessel dzs w/ 5/5 patent grafts. Nl filling pressures->Med rx.   CKD (chronic kidney disease), stage III (HCC)    Diabetes mellitus without complication ([place])    HFrEF (heart failure with reduced ejection fraction) ([place])    a. 08/2020 Echo: EF 40-45%; b. 11/2020 Echo: EF 55%, no rwma, mild LVH, Gr2 DD. nl RV fxn; c. 08/2021 Echo: EF 35-40%, glob HK w/ ant/antsept/apical HK. Reduced RV fxn. Mildly dil LA; d. 10/2021 s/p CRT-P; e. 12/2021 Echo: EF 30-35%, glob HK, GrIII DD. Mod red RV fxn, mild LAE, mild MR, AoV sclerosis.   Hyperlipidemia LDL goal <70    Hypertension    Intermittent Complete heart block ([place])    a. 08/2021 noted on Zio; b. 10/2021 s/p Abbott Allure RF CRT-P (Ser # [id number]).   Ischemic cardiomyopathy    a. 08/2020 Echo: EF 40-45%; b. 11/2020 Echo: EF 55%; c. 08/2021 Echo: EF 35-40%; d. 10/2021 s/p CRT-P; e. 12/2021 Echo: EF 30-35%, glob HK, GrIII DD.   Trifascicular block    Past Surgical History:  Procedure Laterality Date   BACK SURGERY     CARDIAC CATHETERIZATION     CLIPPING OF ATRIAL APPENDAGE N/A 08/29/2020   Procedure: CLIPPING OF ATRIAL APPENDAGE USING ATRICURE 89 MM ATRICLIP FLEX-V;  Surgeon: Grace Isaac, MD;  Location: [place];  Service: Open Heart Surgery;  Laterality: N/A;   CORONARY ARTERY BYPASS GRAFT N/A 08/29/2020   Procedure: CORONARY ARTERY BYPASS GRAFTING (CABG) X 5 USING LEFT INTERNAL  MAMMARY ARTERY AND ENDOSCOPICALLY HARVESTED RIGHT GREATER SAPHENOUS VEIN. LIMA TO LAD, SVG TO OM SEQ TO CIRC, SVG TO PD, SVG TO DIAG.;  Surgeon: Grace Isaac, MD;  Location: [place];  Service: Open Heart Surgery;  Laterality: N/A;   ENDOVEIN HARVEST OF GREATER SAPHENOUS VEIN Right 08/29/2020   Procedure: ENDOVEIN HARVEST OF GREATER SAPHENOUS VEIN;  Surgeon: Grace Isaac, MD;  Location: [facility];  Service: Open Heart Surgery;   Laterality: Right;   HERNIA REPAIR     LEFT HEART CATH AND CORONARY ANGIOGRAPHY N/A 08/25/2020   Procedure: LEFT HEART CATH AND CORONARY ANGIOGRAPHY;  Surgeon: [name], MD;  Location: [place] [facility];  Service: Cardiovascular;  Laterality: N/A;   PACEMAKER IMPLANT N/A 10/12/2021   Procedure: PACEMAKER IMPLANT;  Surgeon: Vickie Epley, MD;  Location: [place] [facility];  Service: Cardiovascular;  Laterality: N/A;   RIGHT/LEFT HEART CATH AND CORONARY ANGIOGRAPHY N/A 08/31/2021   Procedure: RIGHT/LEFT HEART CATH AND CORONARY ANGIOGRAPHY;  Surgeon: [name], MD;  Location: [facility];  Service: Cardiovascular;  Laterality: N/A;   TEE WITHOUT CARDIOVERSION N/A 08/29/2020   Procedure: TRANSESOPHAGEAL ECHOCARDIOGRAM (TEE);  Surgeon: Grace Isaac, MD;  Location: [facility];  Service: Open Heart Surgery;  Laterality: N/A;    Family History  Problem Relation Age of Onset   Heart attack Mother    Social History   Tobacco Use   Smoking status: Former    Types: Cigarettes    Quit date: 07/26/1996    Years since quitting: 25.9   Smokeless tobacco: Never  Substance Use Topics   Alcohol use: Not Currently   Allergies  Allergen Reactions   Angiotensin Receptor Blockers     hyperkalemia   Metformin Diarrhea   Spironolactone     Hyperkalemia    Prior to Admission medications   Medication Sig Start Date End Date Taking? Authorizing Provider  acetaminophen (TYLENOL) 500 MG tablet Take 1,000 mg by mouth every 6 (six) hours as needed for mild pain, fever or headache.   Yes [provider]  acyclovir (ZOVIRAX) 800 MG tablet Take 800 mg by mouth daily. 12/02/20  Yes [provider]  albuterol (VENTOLIN HFA) 108 (90 Base) MCG/ACT inhaler Inhale 2 puffs into the lungs every 6 (six) hours as needed for wheezing or shortness of breath. 09/30/21  Yes [provider]  carvedilol (COREG) 3.125 MG tablet Take 1 tablet (3.125 mg total) by mouth 2  (two) times daily with a meal. 11/12/21  Yes [name], MD  clopidogrel (PLAVIX) 75 MG tablet TAKE 1 TABLET BY MOUTH EVERY DAY Patient taking differently: Take 75 mg by mouth daily. 05/17/21  Yes Loel Dubonnet, NP  dapagliflozin propanediol (FARXIGA) 10 MG TABS tablet Take 10 mg by mouth daily.   Yes [provider]  fluticasone (FLOVENT HFA) 110 MCG/ACT inhaler Inhale 1 puff into the lungs daily.   Yes [provider]  hydrALAZINE (APRESOLINE) 25 MG tablet Take 25 mg by mouth 2 times daily at 12 noon and 4 pm.   Yes [provider]  insulin detemir (LEVEMIR) 100 UNIT/ML injection Inject 0.15 mLs (15 Units total) into the skin at bedtime. 10/30/21  Yes Enzo Bi, MD  isosorbide mononitrate (IMDUR) 30 MG 24 hr tablet Take 1 tablet (30 mg total) by mouth daily. 11/25/21  Yes Darylene Price A, FNP  magnesium oxide (MAG-OX) 400 MG tablet Take 1 tablet (400 mg total) by mouth daily. 01/20/22  Yes Vickie Epley, MD  pantoprazole (PROTONIX) 40 MG tablet Take 40 mg by mouth 2 (two) times daily.   Yes [provider]  potassium chloride SA (KLOR-CON M) 20 MEQ tablet Take 2 tablets (40 mEq total) by mouth daily. 06/28/22  Yes Hackney, Otila Kluver A, FNP  rosuvastatin (CRESTOR) 40 MG tablet TAKE 1 TABLET BY MOUTH EVERY DAY Patient taking differently: Take 40 mg by mouth daily. 12/14/21  Yes [name], MD  ENTRESTO 24-26 MG Take 1 tablet by mouth 2 (two) times daily. Patient not taking: Reported on 07/07/2022 02/24/22   [provider]  metolazone (ZAROXOLYN) 2.5 MG tablet Take 1 tablet (2.5 mg total) by mouth once a week. Patient not taking: Reported on 07/07/2022 04/08/22   Alisa Graff, FNP  torsemide 40 MG TABS Take 40 mg by mouth 2 (two) times daily. Patient not taking: Reported on 07/07/2022 04/01/22   Nolberto Hanlon, MD   Review of Systems  Constitutional:  Negative for appetite change and fatigue.  HENT:  Negative for congestion, postnasal drip and  sneezing.   Eyes: Negative.   Respiratory:  Negative for cough, chest tightness and shortness of breath ("very little).   Cardiovascular:  Negative for chest pain, palpitations and leg swelling (iimproving).  Gastrointestinal:  Negative for abdominal distention and abdominal pain.  Endocrine: Negative.   Genitourinary: Negative.   Musculoskeletal:  Negative for back pain and neck pain.  Skin: Negative.   Allergic/Immunologic: Negative.   Neurological:  Negative for dizziness and light-headedness.  Hematological:  Negative for adenopathy. Does not bruise/bleed easily.  Psychiatric/Behavioral:  Negative for dysphoric mood and sleep disturbance (sleeping on 1 pillows). The patient is not nervous/anxious.    Vitals:   07/07/22 0958  BP: (!) 151/78  Pulse: 63  Resp: 18  SpO2: 100%    Wt Readings from Last 3 Encounters:  07/07/22 228 lb (103.4 kg)  06/25/22 215 lb (97.5 kg)  05/10/22 224 lb (101.6 kg)    Lab Results  Component Value Date   CREATININE 2.14 (H) 07/02/2022   CREATININE 2.92 (H) 06/28/2022   CREATININE 3.44 (H) 06/25/2022   Physical Exam Vitals and nursing note reviewed.  Constitutional:      Appearance: He is well-developed.  HENT:     Head: Normocephalic and atraumatic.  Neck:     Vascular: No JVD.  Cardiovascular:     Rate and Rhythm: Normal rate and regular rhythm.  Pulmonary:     Effort: Pulmonary effort is normal. No tachypnea or accessory muscle usage.  Abdominal:     Palpations: Abdomen is soft.     Tenderness: There is no abdominal tenderness.  Musculoskeletal:        General: No tenderness.     Cervical back: Neck supple.     Right lower leg: No tenderness. Edema (1+ pitting) present.     Left lower leg: No tenderness. Edema (1+ pitting) present.  Skin:    General: Skin is warm and dry.  Neurological:     General: No focal deficit present.     Mental Status: He is alert and oriented to person, place, and time.  Psychiatric:        Mood and  Affect: Mood normal.        Behavior: Behavior normal.    Assessment & Plan:  1: Chronic heart failure with reduced ejection fraction- - NYHA class I - euvolemic today - weighing daily; reminded to call for an overnight weight gain of >2  pounds or a weekly weight gain of > 5 pounds - weight today 228 up 13 pounds from last visit here 06/25/22 - not adding salt and he says that his wife doesn't cook with salt either - had pacemaker implanted 10/12/21 - had hyperkalemia with spironolactone - BNP 03/28/22 was 3468.6 - Resuming enstresto at 24/'26mg'$  BID - check BMP at next visit - PharmD reconciled medications with the patient  2: HTN with CKD- - BP elevated (151/78) - saw PCP 06/04/22 - BMP 07/02/22 reviewed and showed sodium 140, potassium 4.2, creatinine 2.14 and GFR 30 - repeat BMP today - Resuming torsemide '20mg'$  once daily   3: Type 2 DM- - A1c 10/28/21 was 6.8% - home glucose running 120-140's  4: CAD- - saw cardiology Sharolyn Douglas) 04/14/22; returns September  5: Lymphedema-  - stage 2 - tries to elevate legs when sitting for long periods of time - wearing compression socks daily   Mediation bottles reviewed with clinical pharmacists.  Return in 1 months, sooner if needed.

## 2022-07-07 NOTE — Patient Instructions (Addendum)
Continue weighing daily and call for an overnight weight gain of 3 pounds or more or a weekly weight gain of more than 5 pounds.   Start Entresto, one pill in the morning and one in the evening.   Start Torsemide '20mg'$   1 pill daily.

## 2022-07-09 NOTE — Progress Notes (Signed)
[facility] - PHARMACIST COUNSELING NOTE  Guideline-Directed Medical Therapy/Evidence Based Medicine  ACE/ARB/ARNI:  [name] (holding)  hydralazine/isosorbide Beta Blocker: Carvedilol 3.125 mg twice daily Aldosterone Antagonist:  none- hyperkalemia in the past Diuretic: Torsemide '40mg'$  BID plus metolazone 2.'5mg'$  3x/week - holding d/t AKI SGLT2i: Farxiga '10mg'$  daily  Adherence Assessment  Do you ever forget to take your medication? '[]'$ Yes '[x]'$ No  Do you ever skip doses due to side effects? '[]'$ Yes '[x]'$ No  Do you have trouble affording your medicines? '[]'$ Yes '[x]'$ No  Are you ever unable to pick up your medication due to transportation difficulties? '[]'$ Yes '[x]'$ No  Do you ever stop taking your medications because you don't believe they are helping? '[]'$ Yes '[x]'$ No  Do you check your weight daily? '[x]'$ Yes '[]'$ No   Adherence strategy: pill box fill by wife  Barriers to obtaining medications: none  Diet: avoid adding salt to food  Vital signs: HR 45, BP 134/95 , weight (pounds) 230 lbs (gain 8 lbs)  ECHO: Date 08/27/21, EF 35-40%, The left ventricle has moderately decreased function. The left ventricle demonstrates global hypokinesis. Concern for anterior, anteroseptal and apical hypokinesis. The left ventricular  internal cavity size was mildly dilated      Latest Ref Rng & Units 07/07/2022    9:58 AM 07/02/2022   10:04 AM 06/28/2022    9:53 AM  BMP  Glucose 70 - 99 mg/dL 131  165  207   BUN 8 - 23 mg/dL 27  34  68   Creatinine 0.61 - 1.24 mg/dL 2.05  2.14  2.92   Sodium 135 - 145 mmol/L 140  140  138   Potassium 3.5 - 5.1 mmol/L 4.7  4.2  3.2   Chloride 98 - 111 mmol/L 111  114  104   CO2 22 - 32 mmol/L '23  22  24   '$ Calcium 8.9 - 10.3 mg/dL 9.0  8.5  8.1     Past Medical History:  Diagnosis Date   AV block, Mobitz 1    CAD (coronary artery disease)    a. 08/2019 VF arrest-->sev 3vd on cath-->CABGx5 (LIMA->LAD, VG->OM->LCX, VG->RPDA, VG->Diag);  b. 08/2021 Cath: sev native multivessel dzs w/ 5/5 patent grafts. Nl filling pressures->Med rx.   CKD (chronic kidney disease), stage III (HCC)    Diabetes mellitus without complication ([place])    HFrEF (heart failure with reduced ejection fraction) ([place])    a. 08/2020 Echo: EF 40-45%; b. 11/2020 Echo: EF 55%, no rwma, mild LVH, Gr2 DD. nl RV fxn; c. 08/2021 Echo: EF 35-40%, glob HK w/ ant/antsept/apical HK. Reduced RV fxn. Mildly dil LA; d. 10/2021 s/p CRT-P; e. 12/2021 Echo: EF 30-35%, glob HK, GrIII DD. Mod red RV fxn, mild LAE, mild MR, AoV sclerosis.   Hyperlipidemia LDL goal <70    Hypertension    Intermittent Complete heart block ([place])    a. 08/2021 noted on Zio; b. 10/2021 s/p Abbott Allure RF CRT-P (Ser # [id number]).   Ischemic cardiomyopathy    a. 08/2020 Echo: EF 40-45%; b. 11/2020 Echo: EF 55%; c. 08/2021 Echo: EF 35-40%; d. 10/2021 s/p CRT-P; e. 12/2021 Echo: EF 30-35%, glob HK, GrIII DD.   Trifascicular block     ASSESSMENT 87 year old male who presents to the HF clinic for follow up. PMH includes CAD, HFrEF, CKD, DM, HTN, and HLD. Patient reports SOB greatly improved and swelling LEE stable. Continues to use compression stockings. Noted recent inpatient admission on 5/21 for HF exacerbation. Metolazone dose  was increased from 2.'5mg'$  every Tuesday *1x/week) to 2.'5mg'$  three times per week. Farxiga '10mg'$  daily also added to regimen during last admission.  During medrec was noted patient is NOT taking farxiga and is out of metolazone. Avoid premixed seafood batter and sauces to avoid added sodium in diet also discussed. Patient continues to weight daily at home and denies s/sx of hypervolemia.  PLAN (recommendation)  Entresto, torsemide and metolazone on HOLD d/t AKI, recommend repeat  BMP today. Resume Entresto and torsemide at '20mg'$  daily if renal function stable  Follow up as recommended by NP  Time spent: 15 minutes  Greco Gastelum Rodriguez-Guzman PharmD, BCPS 07/09/2022 1:51 PM   Current  Outpatient Medications:    acetaminophen (TYLENOL) 500 MG tablet, Take 1,000 mg by mouth every 6 (six) hours as needed for mild pain, fever or headache., Disp: , Rfl:    acyclovir (ZOVIRAX) 800 MG tablet, Take 800 mg by mouth daily., Disp: , Rfl:    albuterol (VENTOLIN HFA) 108 (90 Base) MCG/ACT inhaler, Inhale 2 puffs into the lungs every 6 (six) hours as needed for wheezing or shortness of breath., Disp: , Rfl:    carvedilol (COREG) 3.125 MG tablet, Take 1 tablet (3.125 mg total) by mouth 2 (two) times daily with a meal., Disp: 180 tablet, Rfl: 3   clopidogrel (PLAVIX) 75 MG tablet, TAKE 1 TABLET BY MOUTH EVERY DAY (Patient taking differently: Take 75 mg by mouth daily.), Disp: 90 tablet, Rfl: 1   dapagliflozin propanediol (FARXIGA) 10 MG TABS tablet, Take 10 mg by mouth daily., Disp: , Rfl:    ENTRESTO 24-26 MG, Take 1 tablet by mouth 2 (two) times daily. (Patient not taking: Reported on 07/07/2022), Disp: , Rfl:    fluticasone (FLOVENT HFA) 110 MCG/ACT inhaler, Inhale 1 puff into the lungs daily., Disp: , Rfl:    hydrALAZINE (APRESOLINE) 25 MG tablet, Take 25 mg by mouth 2 times daily at 12 noon and 4 pm., Disp: , Rfl:    insulin detemir (LEVEMIR) 100 UNIT/ML injection, Inject 0.15 mLs (15 Units total) into the skin at bedtime., Disp: 13.5 mL, Rfl: 0   isosorbide mononitrate (IMDUR) 30 MG 24 hr tablet, Take 1 tablet (30 mg total) by mouth daily., Disp: 90 tablet, Rfl: 3   magnesium oxide (MAG-OX) 400 MG tablet, Take 1 tablet (400 mg total) by mouth daily., Disp: 90 tablet, Rfl: 1   metolazone (ZAROXOLYN) 2.5 MG tablet, Take 1 tablet (2.5 mg total) by mouth once a week. (Patient not taking: Reported on 07/07/2022), Disp: 12 tablet, Rfl: 5   pantoprazole (PROTONIX) 40 MG tablet, Take 40 mg by mouth 2 (two) times daily., Disp: , Rfl:    potassium chloride SA (KLOR-CON M) 20 MEQ tablet, Take 2 tablets (40 mEq total) by mouth daily., Disp: 60 tablet, Rfl: 5   rosuvastatin (CRESTOR) 40 MG tablet, TAKE 1  TABLET BY MOUTH EVERY DAY (Patient taking differently: Take 40 mg by mouth daily.), Disp: 90 tablet, Rfl: 0   torsemide 40 MG TABS, Take 40 mg by mouth 2 (two) times daily. (Patient taking differently: Take 20 mg by mouth daily.), Disp: 60 tablet, Rfl: 0   MEDICATION ADHERENCES TIPS AND STRATEGIES Taking medication as prescribed improves patient outcomes in heart failure (reduces hospitalizations, improves symptoms, increases survival) Side effects of medications can be managed by decreasing doses, switching agents, stopping drugs, or adding additional therapy. Please let someone in the [facility] know if you have having bothersome side effects so we can modify  your regimen. Do not alter your medication regimen without talking to [place].  Medication reminders can help patients remember to take drugs on time. If you are missing or forgetting doses you can try linking behaviors, using pill boxes, or an electronic reminder like an alarm on your phone or an app. Some people can also get automated phone calls as medication reminders.

## 2022-07-21 ENCOUNTER — Encounter
Admission: RE | Admit: 2022-07-21 | Discharge: 2022-07-21 | Disposition: A | Discharge status: Home / Self Care | Payer: Medicare HMO | Source: Ambulatory Visit | Attending: Family | Admitting: Family

## 2022-07-21 ENCOUNTER — Inpatient Hospital Stay: Admission: RE | Admit: 2022-07-21 | Payer: Medicare HMO | Source: Ambulatory Visit

## 2022-07-21 ENCOUNTER — Telehealth: Payer: Self-pay | Admitting: Family

## 2022-07-21 ENCOUNTER — Other Ambulatory Visit: Payer: Medicare HMO

## 2022-07-21 DIAGNOSIS — I5022 Chronic systolic (congestive) heart failure: Secondary | ICD-10-CM | POA: Insufficient documentation

## 2022-07-21 LAB — BASIC METABOLIC PANEL
Anion gap: 11 (ref 5–15)
BUN: 64 mg/dL — ABNORMAL HIGH (ref 8–23)
CO2: 24 mmol/L (ref 22–32)
Calcium: 8.2 mg/dL — ABNORMAL LOW (ref 8.9–10.3)
Chloride: 102 mmol/L (ref 98–111)
Creatinine, Ser: 2.88 mg/dL — ABNORMAL HIGH (ref 0.61–1.24)
GFR, Estimated: 21 mL/min — ABNORMAL LOW (ref >60)
Glucose, Bld: 216 mg/dL — ABNORMAL HIGH (ref 70–99)
Potassium: 3.4 mmol/L — ABNORMAL LOW (ref 3.5–5.1)
Sodium: 137 mmol/L (ref 135–145)

## 2022-07-21 MED ORDER — TORSEMIDE 40 MG PO TABS: 20.0000 mg | ORAL_TABLET | ORAL | 0 refills | Status: DC | PRN

## 2022-07-21 NOTE — Telephone Encounter (Signed)
Spoke with patient regarding BMP results obtained earlier today. When he was last seen on 07/07/22, entresto 25/'26mg'$  BID and torsemide '20mg'$  QD were resumed due to HTN, weight gain and edema. Since this time, he says that his weight is down to 212 pounds and the swelling is much improved.   Renal function has worsened some today with creatinine 2.88 & GFR 21. Instructed patient to continue entresto but now take his torsemide PRN for weight gain >3 pounds overnight or worsening edema. Patient able to verbalize understanding of this and repeated instructions back.   Will recheck labs in 2 weeks.

## 2022-08-02 ENCOUNTER — Other Ambulatory Visit
Admission: RE | Admit: 2022-08-02 | Discharge: 2022-08-02 | Disposition: A | Discharge status: Home / Self Care | Payer: Medicare HMO | Attending: Nurse Practitioner | Admitting: Nurse Practitioner

## 2022-08-02 ENCOUNTER — Ambulatory Visit: Payer: Medicare HMO | Attending: Nurse Practitioner | Admitting: Nurse Practitioner

## 2022-08-02 ENCOUNTER — Encounter: Payer: Self-pay | Admitting: Nurse Practitioner

## 2022-08-02 VITALS — BP 110/60 | HR 60 | Ht 68.0 in | Wt 225.0 lb

## 2022-08-02 DIAGNOSIS — I2510 Atherosclerotic heart disease of native coronary artery without angina pectoris: Secondary | ICD-10-CM | POA: Insufficient documentation

## 2022-08-02 DIAGNOSIS — I5042 Chronic combined systolic (congestive) and diastolic (congestive) heart failure: Secondary | ICD-10-CM

## 2022-08-02 DIAGNOSIS — Z794 Long term (current) use of insulin: Secondary | ICD-10-CM

## 2022-08-02 DIAGNOSIS — N183 Chronic kidney disease, stage 3 unspecified: Secondary | ICD-10-CM

## 2022-08-02 DIAGNOSIS — I255 Ischemic cardiomyopathy: Secondary | ICD-10-CM | POA: Diagnosis not present

## 2022-08-02 DIAGNOSIS — E1122 Type 2 diabetes mellitus with diabetic chronic kidney disease: Secondary | ICD-10-CM

## 2022-08-02 DIAGNOSIS — I453 Trifascicular block: Secondary | ICD-10-CM

## 2022-08-02 DIAGNOSIS — E785 Hyperlipidemia, unspecified: Secondary | ICD-10-CM

## 2022-08-02 DIAGNOSIS — N1832 Chronic kidney disease, stage 3b: Secondary | ICD-10-CM

## 2022-08-02 DIAGNOSIS — I10 Essential (primary) hypertension: Secondary | ICD-10-CM | POA: Insufficient documentation

## 2022-08-02 LAB — BASIC METABOLIC PANEL
Anion gap: 7 (ref 5–15)
BUN: 31 mg/dL — ABNORMAL HIGH (ref 8–23)
CO2: 24 mmol/L (ref 22–32)
Calcium: 8.3 mg/dL — ABNORMAL LOW (ref 8.9–10.3)
Chloride: 107 mmol/L (ref 98–111)
Creatinine, Ser: 1.94 mg/dL — ABNORMAL HIGH (ref 0.61–1.24)
GFR, Estimated: 34 mL/min — ABNORMAL LOW (ref >60)
Glucose, Bld: 232 mg/dL — ABNORMAL HIGH (ref 70–99)
Potassium: 4.1 mmol/L (ref 3.5–5.1)
Sodium: 138 mmol/L (ref 135–145)

## 2022-08-02 NOTE — Patient Instructions (Signed)
Medication Instructions:  - Your physician recommends that you continue on your current medications as directed. Please refer to the Current Medication list given to you today.  *If you need a refill on your cardiac medications before your next appointment, please call your pharmacy*   Lab Work: - Your physician recommends that you have lab work today:   [place] En[facility]ance at [facility] 1st desk on the right to check in (REGISTRATION)  Lab hours: Monday- Friday (7:30 am- 5:30 pm)  If you have labs (blood work) drawn today and your tests are completely normal, you will receive your results only by: MyChart Message (if you have MyChart) OR A paper copy in the mail If you have any lab test that is abnormal or we need to change your treatment, we will call you to review the results.   Testing/Procedures: - none ordered   Follow-Up: At [facility], you and your health needs are our priority.  As part of our continuing mission to provide you with exceptional heart care, we have created designated Provider Care Teams.  These Care Teams include your primary Cardiologist (physician) and Advanced Practice Providers (APPs -  Physician Assistants and Nurse Practitioners) who all work together to provide you with the care you need, when you need it.  We recommend signing up for the patient portal called "MyChart".  Sign up information is provided on this After Visit Summary.  MyChart is used to connect with patients for Virtual Visits (Telemedicine).  Patients are able to view lab/test results, encounter notes, upcoming appointments, etc.  Non-urgent messages can be sent to your provider as well.   To learn more about what you can do with MyChart, go to [email or web address].    Your next appointment:   3 month(s)  The format for your next appointment:   In Person  Provider:   You may see [name], MD or one of the following Advanced Practice Providers on your designated  Care Team:   Murray Hodgkins, NP   Other Instructions N/a  Important Information About Sugar
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
## 2022-08-04 ENCOUNTER — Ambulatory Visit: Payer: Medicare HMO | Admitting: Family

## 2022-09-19 NOTE — Progress Notes (Unsigned)
Patient ID: [name], male    DOB: [birth year], 87 y.o.   MRN: [id number]  HPI  [name] is a 87 y/o male with a history of heart block, CAD, DM, hyperlipidemia, HTN, pacemaker implantation, CKD, previous tobacco use and chronic heart failure.   Echo report from 01/05/22 reviewed and showed an EF of 30-35% along with mild LVH. Echo report from 08/27/21 reviewed and showed an EF of 35-40% along with mild LAE.   RHC/LHC done 08/31/21 & showed: 1.  Severe underlying three-vessel coronary artery disease with patent grafts including LIMA to LAD, SVG to large diagonal, sequential SVG to OM /distal left circumflex and SVG to right PDA. 2.  Right heart catheterization showed normal right and left-sided filling pressures, minimal pulmonary hypertension and normal cardiac output.  Admitted 03/28/22 due to acute onset of worsening dyspnea with associated dry cough and wheezing for the last week as well as associated lower extremity edema. BP elevated upon arrival to ED. Cardiology consult obtained. Initially given IV lasix with transition to oral diuretics. Elevated troponin thought to be due to demand ischemia. Discharged after 4 days. Was in the ED 03/24/22 due to  worsening shortness of breath, orthopnea and cough. Treated and released after good urine output.   He presents today for a follow-up visit with a chief complaint of minimal shortness of breath upon moderate exertion. Describes this as chronic in nature. He has associated fatigue and pedal edema along with this. He denies any difficulty sleeping, dizziness, abdominal distention, palpitations, chest pain, cough, wheezing or weight gain.   Says that he's only had to take his torsemide a couple of times since he was last here.   Past Medical History:  Diagnosis Date   AV block, Mobitz 1    CAD (coronary artery disease)    a. 08/2019 VF arrest-->sev 3vd on cath-->CABGx5 (LIMA->LAD, VG->OM->LCX, VG->RPDA, VG->Diag); b. 08/2021 Cath: sev native  multivessel dzs w/ 5/5 patent grafts. Nl filling pressures->Med rx.   CKD (chronic kidney disease), stage III (HCC)    Diabetes mellitus without complication ([place])    HFrEF (heart failure with reduced ejection fraction) ([place])    a. 08/2020 Echo: EF 40-45%; b. 11/2020 Echo: EF 55%, no rwma, mild LVH, Gr2 DD. nl RV fxn; c. 08/2021 Echo: EF 35-40%, glob HK w/ ant/antsept/apical HK. Reduced RV fxn. Mildly dil LA; d. 10/2021 s/p CRT-P; e. 12/2021 Echo: EF 30-35%, glob HK, GrIII DD. Mod red RV fxn, mild LAE, mild [place], AoV sclerosis.   Hyperlipidemia LDL goal <70    Hypertension    Intermittent Complete heart block ([place])    a. 08/2021 noted on Zio; b. 10/2021 s/p Abbott Allure RF CRT-P (Ser # [id number]).   Ischemic cardiomyopathy    a. 08/2020 Echo: EF 40-45%; b. 11/2020 Echo: EF 55%; c. 08/2021 Echo: EF 35-40%; d. 10/2021 s/p CRT-P; e. 12/2021 Echo: EF 30-35%, glob HK, GrIII DD.   Trifascicular block    Past Surgical History:  Procedure Laterality Date   BACK SURGERY     CARDIAC CATHETERIZATION     CLIPPING OF ATRIAL APPENDAGE N/A 08/29/2020   Procedure: CLIPPING OF ATRIAL APPENDAGE USING ATRICURE 42 MM ATRICLIP FLEX-V;  Surgeon: Grace Isaac, MD;  Location: [place];  Service: Open Heart Surgery;  Laterality: N/A;   CORONARY ARTERY BYPASS GRAFT N/A 08/29/2020   Procedure: CORONARY ARTERY BYPASS GRAFTING (CABG) X 5 USING LEFT INTERNAL MAMMARY ARTERY AND ENDOSCOPICALLY HARVESTED RIGHT GREATER SAPHENOUS VEIN. LIMA TO LAD, SVG TO  OM SEQ TO CIRC, SVG TO PD, SVG TO DIAG.;  Surgeon: Grace Isaac, MD;  Location: [place];  Service: Open Heart Surgery;  Laterality: N/A;   ENDOVEIN HARVEST OF GREATER SAPHENOUS VEIN Right 08/29/2020   Procedure: ENDOVEIN HARVEST OF GREATER SAPHENOUS VEIN;  Surgeon: Grace Isaac, MD;  Location: [place];  Service: Open Heart Surgery;  Laterality: Right;   HERNIA REPAIR     LEFT HEART CATH AND CORONARY ANGIOGRAPHY N/A 08/25/2020   Procedure: LEFT HEART CATH AND CORONARY  ANGIOGRAPHY;  Surgeon: [name], MD;  Location: [place] [facility];  Service: Cardiovascular;  Laterality: N/A;   PACEMAKER IMPLANT N/A 10/12/2021   Procedure: PACEMAKER IMPLANT;  Surgeon: Vickie Epley, MD;  Location: [place] [facility];  Service: Cardiovascular;  Laterality: N/A;   RIGHT/LEFT HEART CATH AND CORONARY ANGIOGRAPHY N/A 08/31/2021   Procedure: RIGHT/LEFT HEART CATH AND CORONARY ANGIOGRAPHY;  Surgeon: [name], MD;  Location: [place] [facility];  Service: Cardiovascular;  Laterality: N/A;   TEE WITHOUT CARDIOVERSION N/A 08/29/2020   Procedure: TRANSESOPHAGEAL ECHOCARDIOGRAM (TEE);  Surgeon: Grace Isaac, MD;  Location: [place];  Service: Open Heart Surgery;  Laterality: N/A;    Family History  Problem Relation Age of Onset   Heart attack Mother    Social History   Tobacco Use   Smoking status: Former    Types: Cigarettes    Quit date: 07/26/1996    Years since quitting: 26.1   Smokeless tobacco: Never  Substance Use Topics   Alcohol use: Not Currently   Allergies  Allergen Reactions   Angiotensin Receptor Blockers     hyperkalemia   Metformin Diarrhea   Spironolactone     Hyperkalemia    Prior to Admission medications   Medication Sig Start Date End Date Taking? Authorizing Provider  acetaminophen (TYLENOL) 500 MG tablet Take 1,000 mg by mouth every 6 (six) hours as needed for mild pain, fever or headache.   Yes [provider]  acyclovir (ZOVIRAX) 800 MG tablet Take 800 mg by mouth daily. 12/02/20  Yes [provider]  albuterol (VENTOLIN HFA) 108 (90 Base) MCG/ACT inhaler Inhale 2 puffs into the lungs every 6 (six) hours as needed for wheezing or shortness of breath. 09/30/21  Yes [provider]  carvedilol (COREG) 3.125 MG tablet Take 1 tablet (3.125 mg total) by mouth 2 (two) times daily with a meal. 11/12/21  Yes [name], MD  clopidogrel (PLAVIX) 75 MG tablet TAKE 1 TABLET BY MOUTH EVERY DAY  05/17/21  Yes Loel Dubonnet, NP  dapagliflozin propanediol (FARXIGA) 10 MG TABS tablet Take 10 mg by mouth daily.   Yes [provider]  ENTRESTO 24-26 MG Take 1 tablet by mouth 2 (two) times daily. 02/24/22  Yes [provider]  fluticasone (FLOVENT HFA) 110 MCG/ACT inhaler Inhale 1 puff into the lungs daily.   Yes [provider]  hydrALAZINE (APRESOLINE) 25 MG tablet Take 25 mg by mouth 2 times daily at 12 noon and 4 pm.   Yes [provider]  insulin detemir (LEVEMIR) 100 UNIT/ML injection Inject 0.15 mLs (15 Units total) into the skin at bedtime. 10/30/21  Yes Enzo Bi, MD  isosorbide mononitrate (IMDUR) 30 MG 24 hr tablet Take 1 tablet (30 mg total) by mouth daily. 11/25/21  Yes Darylene Price A, FNP  magnesium oxide (MAG-OX) 400 MG tablet Take 1 tablet (400 mg total) by mouth daily. 01/20/22  Yes Vickie Epley,  MD  metolazone (ZAROXOLYN) 2.5 MG tablet Take 2.5 mg by mouth 3 (three) times a week.   Yes [provider]  pantoprazole (PROTONIX) 40 MG tablet Take 40 mg by mouth 2 (two) times daily.   Yes [provider]  Potassium Chloride ER 20 MEQ TBCR Take 1 tablet by mouth daily.   Yes [provider]  rosuvastatin (CRESTOR) 40 MG tablet TAKE 1 TABLET BY MOUTH EVERY DAY 12/14/21  Yes [name], MD  torsemide (DEMADEX) 20 MG tablet Take 20 mg by mouth daily as needed (swelling).   Yes [provider]   Review of Systems  Constitutional:  Positive for fatigue. Negative for appetite change.  HENT:  Negative for congestion, postnasal drip and sneezing.   Eyes: Negative.   Respiratory:  Positive for shortness of breath ("very little). Negative for cough and chest tightness.   Cardiovascular:  Positive for leg swelling ("not much"). Negative for chest pain and palpitations.  Gastrointestinal:  Negative for abdominal distention and abdominal pain.  Endocrine: Negative.   Genitourinary: Negative.    Musculoskeletal:  Negative for back pain and neck pain.  Skin: Negative.   Allergic/Immunologic: Negative.   Neurological:  Negative for dizziness and light-headedness.  Hematological:  Negative for adenopathy. Does not bruise/bleed easily.  Psychiatric/Behavioral:  Negative for dysphoric mood and sleep disturbance (sleeping on 1 pillows). The patient is not nervous/anxious.    Vitals:   09/20/22 0927 09/20/22 0943  BP: (!) 144/99 128/70  Pulse: 60   Resp: 18   SpO2: 99%   Weight: 224 lb 8 oz (101.8 kg)    Wt Readings from Last 3 Encounters:  09/20/22 224 lb 8 oz (101.8 kg)  08/02/22 225 lb (102.1 kg)  07/07/22 228 lb (103.4 kg)   Lab Results  Component Value Date   CREATININE 1.94 (H) 08/02/2022   CREATININE 2.88 (H) 07/21/2022   CREATININE 2.05 (H) 07/07/2022   Physical Exam Vitals and nursing note reviewed.  Constitutional:      Appearance: He is well-developed.  HENT:     Head: Normocephalic and atraumatic.  Neck:     Vascular: No JVD.  Cardiovascular:     Rate and Rhythm: Normal rate and regular rhythm.  Pulmonary:     Effort: Pulmonary effort is normal. No tachypnea or accessory muscle usage.  Abdominal:     Palpations: Abdomen is soft.     Tenderness: There is no abdominal tenderness.  Musculoskeletal:        General: No tenderness.     Cervical back: Neck supple.     Right lower leg: No tenderness. Edema (trace pitting) present.     Left lower leg: No tenderness. Edema (trace pitting) present.  Skin:    General: Skin is warm and dry.  Neurological:     General: No focal deficit present.     Mental Status: He is alert and oriented to person, place, and time.  Psychiatric:        Mood and Affect: Mood normal.        Behavior: Behavior normal.    Assessment & Plan:  1: Chronic heart failure with reduced ejection fraction- - NYHA class II - euvolemic today - weighing daily; reminded to call for an overnight weight gain of >2 pounds or a weekly weight  gain of > 5 pounds - weight down 4 pounds from from last visit here 3 months ago - not adding salt and he says that his wife doesn't cook with  salt either - had pacemaker implanted 10/12/21 - on GDMT of carvedilol, farxiga and entresto - had hyperkalemia with spironolactone - BNP 03/28/22 was 3468.6 - getting his flu vaccine later this week  2: HTN with CKD- - BP elevated (144/99) initially but rechecked with manual cuff and it was 128/70 - saw PCP 06/04/22 - BMP 08/02/22 reviewed and showed sodium 138, potassium 4.1, creatinine 1.94 and GFR 34  3: Type 2 DM- - A1c 10/28/21 was 6.8%  4: CAD- - saw cardiology Sharolyn Douglas) 08/02/22  5: Lymphedema-  - stage 2 - tries to elevate legs when sitting for long periods of time - wearing compression socks daily   Mediation bottles reviewed.  Return in 3 months, sooner if needed.    While getting his paperwork ready, he stated that he all of sudden he was feeling very "gassy" with an upset stomach. He then started vomiting into the trash can. He said that he ate instant oatmeal this morning and was fine until right now. Denied having any fevers, abdominal pain or being around any sick contacts. He vomited several more times but then was given some water and gingerale and he was able to keep that down. Saltine crackers were offered but he declined. Also offered to take patient to the ED but he declines that and says that he's feeling better now. Wife at home was called to let her know what was going on.  Called patient a few hours later and he says that he's feeling much better.

## 2022-09-20 ENCOUNTER — Encounter: Payer: Self-pay | Admitting: Family

## 2022-09-20 ENCOUNTER — Ambulatory Visit: Payer: Medicare HMO | Attending: Family | Admitting: Family

## 2022-09-20 VITALS — BP 128/70 | HR 60 | Resp 18 | Wt 224.5 lb

## 2022-09-20 DIAGNOSIS — E875 Hyperkalemia: Secondary | ICD-10-CM | POA: Diagnosis not present

## 2022-09-20 DIAGNOSIS — Z87891 Personal history of nicotine dependence: Secondary | ICD-10-CM | POA: Insufficient documentation

## 2022-09-20 DIAGNOSIS — I272 Pulmonary hypertension, unspecified: Secondary | ICD-10-CM | POA: Insufficient documentation

## 2022-09-20 DIAGNOSIS — I5022 Chronic systolic (congestive) heart failure: Secondary | ICD-10-CM | POA: Diagnosis present

## 2022-09-20 DIAGNOSIS — Z950 Presence of cardiac pacemaker: Secondary | ICD-10-CM | POA: Insufficient documentation

## 2022-09-20 DIAGNOSIS — I890 Lymphedema, not elsewhere classified: Secondary | ICD-10-CM | POA: Diagnosis not present

## 2022-09-20 DIAGNOSIS — E785 Hyperlipidemia, unspecified: Secondary | ICD-10-CM | POA: Insufficient documentation

## 2022-09-20 DIAGNOSIS — I10 Essential (primary) hypertension: Secondary | ICD-10-CM | POA: Diagnosis not present

## 2022-09-20 DIAGNOSIS — Z794 Long term (current) use of insulin: Secondary | ICD-10-CM

## 2022-09-20 DIAGNOSIS — I2510 Atherosclerotic heart disease of native coronary artery without angina pectoris: Secondary | ICD-10-CM | POA: Diagnosis not present

## 2022-09-20 DIAGNOSIS — E1122 Type 2 diabetes mellitus with diabetic chronic kidney disease: Secondary | ICD-10-CM

## 2022-09-20 DIAGNOSIS — I130 Hypertensive heart and chronic kidney disease with heart failure and stage 1 through stage 4 chronic kidney disease, or unspecified chronic kidney disease: Secondary | ICD-10-CM | POA: Diagnosis present

## 2022-09-20 DIAGNOSIS — K30 Functional dyspepsia: Secondary | ICD-10-CM | POA: Insufficient documentation

## 2022-09-20 DIAGNOSIS — N1832 Chronic kidney disease, stage 3b: Secondary | ICD-10-CM

## 2022-09-20 NOTE — Patient Instructions (Signed)
Continue weighing daily and call for an overnight weight gain of 3 pounds or more or a weekly weight gain of more than 5 pounds.   If you have voicemail, please make sure your mailbox is cleaned out so that we may leave a message and please make sure to listen to any voicemails.     

## 2022-09-25 ENCOUNTER — Other Ambulatory Visit: Payer: Self-pay | Admitting: Family

## 2022-11-17 ENCOUNTER — Encounter: Payer: Self-pay | Admitting: Nurse Practitioner

## 2022-11-17 ENCOUNTER — Ambulatory Visit: Payer: Medicare HMO | Attending: Nurse Practitioner | Admitting: Nurse Practitioner

## 2022-11-17 VITALS — BP 160/82 | HR 71 | Ht 68.0 in | Wt 224.2 lb

## 2022-11-17 DIAGNOSIS — N183 Chronic kidney disease, stage 3 unspecified: Secondary | ICD-10-CM

## 2022-11-17 DIAGNOSIS — I10 Essential (primary) hypertension: Secondary | ICD-10-CM | POA: Diagnosis not present

## 2022-11-17 DIAGNOSIS — I5042 Chronic combined systolic (congestive) and diastolic (congestive) heart failure: Secondary | ICD-10-CM | POA: Diagnosis not present

## 2022-11-17 DIAGNOSIS — I255 Ischemic cardiomyopathy: Secondary | ICD-10-CM

## 2022-11-17 DIAGNOSIS — I2510 Atherosclerotic heart disease of native coronary artery without angina pectoris: Secondary | ICD-10-CM

## 2022-11-17 DIAGNOSIS — I453 Trifascicular block: Secondary | ICD-10-CM

## 2022-11-17 DIAGNOSIS — E785 Hyperlipidemia, unspecified: Secondary | ICD-10-CM

## 2022-11-17 MED ORDER — ENTRESTO 24-26 MG PO TABS: 1.0000 | ORAL_TABLET | Freq: Two times a day (BID) | ORAL | 0 refills | Status: DC

## 2022-11-17 MED ORDER — CARVEDILOL 6.25 MG PO TABS: 6.2500 mg | ORAL_TABLET | Freq: Two times a day (BID) | ORAL | 3 refills | Status: DC
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
## 2022-11-17 NOTE — Patient Instructions (Signed)
Medication Instructions:   INCREASE Carvedilol - take one tablet (6.'25mg'$ ) by mouth twice a day.    *If you need a refill on your cardiac medications before your next appointment, please call your pharmacy*   Lab Work:  None Ordered  If you have labs (blood work) drawn today and your tests are completely normal, you will receive your results only by: [place] (if you have MyChart) OR A paper copy in the mail If you have any lab test that is abnormal or we need to change your treatment, we will call you to review the results.   Testing/Procedures:  None Ordered   Follow-Up: At [facility], you and your health needs are our priority.  As part of our continuing mission to provide you with exceptional heart care, we have created designated Provider Care Teams.  These Care Teams include your primary Cardiologist (physician) and Advanced Practice Providers (APPs -  Physician Assistants and Nurse Practitioners) who all work together to provide you with the care you need, when you need it.  We recommend signing up for the patient portal called "MyChart".  Sign up information is provided on this After Visit Summary.  MyChart is used to connect with patients for Virtual Visits (Telemedicine).  Patients are able to view lab/test results, encounter notes, upcoming appointments, etc.  Non-urgent messages can be sent to your provider as well.   To learn more about what you can do with MyChart, go to [email or web address].    Your next appointment:   3 month(s)  The format for your next appointment:   In Person  Provider:   You may see [name], MD or one of the following Advanced Practice Providers on your designated Care Team:   Murray Hodgkins, NP Christell Faith, PA-C Cadence Kathlen Mody, PA-C Gerrie Nordmann, NP

## 2022-12-15 ENCOUNTER — Emergency Department: Payer: Medicare HMO

## 2022-12-15 ENCOUNTER — Inpatient Hospital Stay
Admission: EM | Admit: 2022-12-15 | Discharge: 2022-12-19 | DRG: 291 | Disposition: A | Discharge status: Home / Self Care | Payer: Medicare HMO | Attending: Internal Medicine | Admitting: Internal Medicine

## 2022-12-15 ENCOUNTER — Encounter: Payer: Self-pay | Admitting: Emergency Medicine

## 2022-12-15 ENCOUNTER — Other Ambulatory Visit: Payer: Self-pay

## 2022-12-15 DIAGNOSIS — I161 Hypertensive emergency: Secondary | ICD-10-CM | POA: Diagnosis not present

## 2022-12-15 DIAGNOSIS — I509 Heart failure, unspecified: Secondary | ICD-10-CM

## 2022-12-15 DIAGNOSIS — I252 Old myocardial infarction: Secondary | ICD-10-CM

## 2022-12-15 DIAGNOSIS — R079 Chest pain, unspecified: Principal | ICD-10-CM

## 2022-12-15 DIAGNOSIS — K5900 Constipation, unspecified: Secondary | ICD-10-CM | POA: Insufficient documentation

## 2022-12-15 DIAGNOSIS — I2510 Atherosclerotic heart disease of native coronary artery without angina pectoris: Secondary | ICD-10-CM | POA: Diagnosis present

## 2022-12-15 DIAGNOSIS — Z9861 Coronary angioplasty status: Secondary | ICD-10-CM

## 2022-12-15 DIAGNOSIS — Z7902 Long term (current) use of antithrombotics/antiplatelets: Secondary | ICD-10-CM

## 2022-12-15 DIAGNOSIS — D696 Thrombocytopenia, unspecified: Secondary | ICD-10-CM | POA: Insufficient documentation

## 2022-12-15 DIAGNOSIS — I5043 Acute on chronic combined systolic (congestive) and diastolic (congestive) heart failure: Secondary | ICD-10-CM | POA: Diagnosis present

## 2022-12-15 DIAGNOSIS — I255 Ischemic cardiomyopathy: Secondary | ICD-10-CM

## 2022-12-15 DIAGNOSIS — I130 Hypertensive heart and chronic kidney disease with heart failure and stage 1 through stage 4 chronic kidney disease, or unspecified chronic kidney disease: Principal | ICD-10-CM | POA: Diagnosis present

## 2022-12-15 DIAGNOSIS — Z8249 Family history of ischemic heart disease and other diseases of the circulatory system: Secondary | ICD-10-CM

## 2022-12-15 DIAGNOSIS — E876 Hypokalemia: Secondary | ICD-10-CM | POA: Insufficient documentation

## 2022-12-15 DIAGNOSIS — E1169 Type 2 diabetes mellitus with other specified complication: Secondary | ICD-10-CM | POA: Diagnosis present

## 2022-12-15 DIAGNOSIS — Z950 Presence of cardiac pacemaker: Secondary | ICD-10-CM

## 2022-12-15 DIAGNOSIS — I5023 Acute on chronic systolic (congestive) heart failure: Secondary | ICD-10-CM | POA: Diagnosis present

## 2022-12-15 DIAGNOSIS — Z6834 Body mass index (BMI) 34.0-34.9, adult: Secondary | ICD-10-CM

## 2022-12-15 DIAGNOSIS — Z91148 Patient's other noncompliance with medication regimen for other reason: Secondary | ICD-10-CM

## 2022-12-15 DIAGNOSIS — Z79899 Other long term (current) drug therapy: Secondary | ICD-10-CM

## 2022-12-15 DIAGNOSIS — Z794 Long term (current) use of insulin: Secondary | ICD-10-CM

## 2022-12-15 DIAGNOSIS — N184 Chronic kidney disease, stage 4 (severe): Secondary | ICD-10-CM

## 2022-12-15 DIAGNOSIS — I472 Ventricular tachycardia, unspecified: Secondary | ICD-10-CM | POA: Diagnosis not present

## 2022-12-15 DIAGNOSIS — I110 Hypertensive heart disease with heart failure: Secondary | ICD-10-CM

## 2022-12-15 DIAGNOSIS — I493 Ventricular premature depolarization: Secondary | ICD-10-CM | POA: Diagnosis present

## 2022-12-15 DIAGNOSIS — E669 Obesity, unspecified: Secondary | ICD-10-CM | POA: Insufficient documentation

## 2022-12-15 DIAGNOSIS — E1122 Type 2 diabetes mellitus with diabetic chronic kidney disease: Secondary | ICD-10-CM | POA: Diagnosis present

## 2022-12-15 DIAGNOSIS — N1832 Chronic kidney disease, stage 3b: Secondary | ICD-10-CM

## 2022-12-15 DIAGNOSIS — N1831 Chronic kidney disease, stage 3a: Secondary | ICD-10-CM | POA: Diagnosis present

## 2022-12-15 DIAGNOSIS — N3289 Other specified disorders of bladder: Secondary | ICD-10-CM | POA: Diagnosis present

## 2022-12-15 DIAGNOSIS — R7989 Other specified abnormal findings of blood chemistry: Secondary | ICD-10-CM | POA: Diagnosis present

## 2022-12-15 DIAGNOSIS — Z951 Presence of aortocoronary bypass graft: Secondary | ICD-10-CM

## 2022-12-15 DIAGNOSIS — T502X6A Underdosing of carbonic-anhydrase inhibitors, benzothiadiazides and other diuretics, initial encounter: Secondary | ICD-10-CM | POA: Diagnosis present

## 2022-12-15 DIAGNOSIS — I10 Essential (primary) hypertension: Secondary | ICD-10-CM | POA: Diagnosis present

## 2022-12-15 DIAGNOSIS — E785 Hyperlipidemia, unspecified: Secondary | ICD-10-CM | POA: Diagnosis present

## 2022-12-15 DIAGNOSIS — K219 Gastro-esophageal reflux disease without esophagitis: Secondary | ICD-10-CM | POA: Diagnosis present

## 2022-12-15 DIAGNOSIS — I2489 Other forms of acute ischemic heart disease: Secondary | ICD-10-CM | POA: Diagnosis present

## 2022-12-15 DIAGNOSIS — Z87891 Personal history of nicotine dependence: Secondary | ICD-10-CM

## 2022-12-15 DIAGNOSIS — Z1152 Encounter for screening for COVID-19: Secondary | ICD-10-CM

## 2022-12-15 LAB — CBC
HCT: 49.1 % (ref 39.0–52.0)
Hemoglobin: 14.8 g/dL (ref 13.0–17.0)
MCH: 26.2 pg (ref 26.0–34.0)
MCHC: 30.1 g/dL (ref 30.0–36.0)
MCV: 87.1 fL (ref 80.0–100.0)
Platelets: 164 10*3/uL (ref 150–400)
RBC: 5.64 MIL/uL (ref 4.22–5.81)
RDW: 17.1 % — ABNORMAL HIGH (ref 11.5–15.5)
WBC: 5.9 10*3/uL (ref 4.0–10.5)
nRBC: 0 % (ref 0.0–0.2)

## 2022-12-15 LAB — CBG MONITORING, ED: Glucose-Capillary: 122 mg/dL — ABNORMAL HIGH (ref 70–99)

## 2022-12-15 LAB — BASIC METABOLIC PANEL
Anion gap: 13 (ref 5–15)
BUN: 29 mg/dL — ABNORMAL HIGH (ref 8–23)
CO2: 20 mmol/L — ABNORMAL LOW (ref 22–32)
Calcium: 8.4 mg/dL — ABNORMAL LOW (ref 8.9–10.3)
Chloride: 109 mmol/L (ref 98–111)
Creatinine, Ser: 1.7 mg/dL — ABNORMAL HIGH (ref 0.61–1.24)
GFR, Estimated: 39 mL/min — ABNORMAL LOW (ref >60)
Glucose, Bld: 161 mg/dL — ABNORMAL HIGH (ref 70–99)
Potassium: 4.2 mmol/L (ref 3.5–5.1)
Sodium: 142 mmol/L (ref 135–145)

## 2022-12-15 LAB — TROPONIN I (HIGH SENSITIVITY)
Troponin I (High Sensitivity): 18 ng/L — ABNORMAL HIGH (ref <18)
Troponin I (High Sensitivity): 23 ng/L — ABNORMAL HIGH (ref <18)

## 2022-12-15 LAB — RESP PANEL BY RT-PCR (RSV, FLU A&B, COVID)  RVPGX2
Influenza A by PCR: NEGATIVE
Influenza B by PCR: NEGATIVE
Resp Syncytial Virus by PCR: NEGATIVE
SARS Coronavirus 2 by RT PCR: NEGATIVE

## 2022-12-15 LAB — PROTIME-INR
INR: 1.1 (ref 0.8–1.2)
Prothrombin Time: 13.6 seconds (ref 11.4–15.2)

## 2022-12-15 MED ORDER — NITROGLYCERIN 2 % TD OINT
1.0000 [in_us] | TOPICAL_OINTMENT | Freq: Once | TRANSDERMAL | Status: DC
  Filled 2022-12-15: qty 1

## 2022-12-15 MED ORDER — ALBUTEROL SULFATE HFA 108 (90 BASE) MCG/ACT IN AERS: 2.0000 | INHALATION_SPRAY | Freq: Four times a day (QID) | RESPIRATORY_TRACT | Status: DC | PRN

## 2022-12-15 MED ORDER — IOHEXOL 350 MG/ML SOLN
60.0000 mL | Freq: Once | INTRAVENOUS | Status: AC | PRN
  Administered 2022-12-15: 60 mL via INTRAVENOUS

## 2022-12-15 MED ORDER — ISOSORBIDE MONONITRATE ER 30 MG PO TB24
30.0000 mg | ORAL_TABLET | Freq: Every day | ORAL | Status: DC
  Administered 2022-12-16 – 2022-12-18 (×3): 30 mg via ORAL
  Filled 2022-12-15 (×3): qty 1

## 2022-12-15 MED ORDER — ACETAMINOPHEN 650 MG RE SUPP: 650.0000 mg | Freq: Four times a day (QID) | RECTAL | Status: DC | PRN

## 2022-12-15 MED ORDER — FUROSEMIDE 10 MG/ML IJ SOLN
40.0000 mg | Freq: Once | INTRAMUSCULAR | Status: AC
  Administered 2022-12-15: 40 mg via INTRAVENOUS
  Filled 2022-12-15: qty 4

## 2022-12-15 MED ORDER — NITROGLYCERIN 2 % TD OINT
1.0000 [in_us] | TOPICAL_OINTMENT | Freq: Once | TRANSDERMAL | Status: AC
  Administered 2022-12-15: 1 [in_us] via TOPICAL
  Filled 2022-12-15: qty 1

## 2022-12-15 MED ORDER — DAPAGLIFLOZIN PROPANEDIOL 10 MG PO TABS
10.0000 mg | ORAL_TABLET | Freq: Every day | ORAL | Status: DC
  Administered 2022-12-16 – 2022-12-19 (×4): 10 mg via ORAL
  Filled 2022-12-15 (×4): qty 1

## 2022-12-15 MED ORDER — ROSUVASTATIN CALCIUM 10 MG PO TABS
40.0000 mg | ORAL_TABLET | Freq: Every day | ORAL | Status: DC
  Administered 2022-12-16 – 2022-12-19 (×4): 40 mg via ORAL
  Filled 2022-12-15: qty 4
  Filled 2022-12-15: qty 2
  Filled 2022-12-15 (×2): qty 4

## 2022-12-15 MED ORDER — ACETAMINOPHEN 325 MG PO TABS
650.0000 mg | ORAL_TABLET | Freq: Four times a day (QID) | ORAL | Status: DC | PRN
  Administered 2022-12-19: 650 mg via ORAL
  Filled 2022-12-15 (×2): qty 2

## 2022-12-15 MED ORDER — MAGNESIUM OXIDE 400 MG PO TABS
400.0000 mg | ORAL_TABLET | Freq: Every day | ORAL | Status: DC
  Administered 2022-12-16 – 2022-12-19 (×4): 400 mg via ORAL
  Filled 2022-12-15 (×7): qty 1

## 2022-12-15 MED ORDER — INSULIN ASPART 100 UNIT/ML IJ SOLN
0.0000 [IU] | Freq: Three times a day (TID) | INTRAMUSCULAR | Status: DC
  Administered 2022-12-16 (×2): 2 [IU] via SUBCUTANEOUS
  Administered 2022-12-16: 3 [IU] via SUBCUTANEOUS
  Administered 2022-12-17: 2 [IU] via SUBCUTANEOUS
  Administered 2022-12-17 – 2022-12-18 (×3): 3 [IU] via SUBCUTANEOUS
  Administered 2022-12-19: 5 [IU] via SUBCUTANEOUS
  Filled 2022-12-15 (×8): qty 1

## 2022-12-15 MED ORDER — CARVEDILOL 6.25 MG PO TABS
6.2500 mg | ORAL_TABLET | Freq: Two times a day (BID) | ORAL | Status: DC
  Administered 2022-12-16 – 2022-12-19 (×7): 6.25 mg via ORAL
  Filled 2022-12-15 (×7): qty 1

## 2022-12-15 MED ORDER — HYDRALAZINE HCL 25 MG PO TABS
25.0000 mg | ORAL_TABLET | Freq: Two times a day (BID) | ORAL | Status: DC
  Administered 2022-12-16 – 2022-12-19 (×6): 25 mg via ORAL
  Filled 2022-12-15 (×7): qty 1

## 2022-12-15 MED ORDER — INSULIN ASPART 100 UNIT/ML IJ SOLN: 0.0000 [IU] | Freq: Every day | INTRAMUSCULAR | Status: DC

## 2022-12-15 MED ORDER — FUROSEMIDE 10 MG/ML IJ SOLN
40.0000 mg | Freq: Two times a day (BID) | INTRAMUSCULAR | Status: DC
  Administered 2022-12-16 – 2022-12-18 (×5): 40 mg via INTRAVENOUS
  Filled 2022-12-15 (×5): qty 4

## 2022-12-15 MED ORDER — ONDANSETRON HCL 4 MG PO TABS: 4.0000 mg | ORAL_TABLET | Freq: Four times a day (QID) | ORAL | Status: DC | PRN

## 2022-12-15 MED ORDER — SACUBITRIL-VALSARTAN 24-26 MG PO TABS
1.0000 | ORAL_TABLET | Freq: Two times a day (BID) | ORAL | Status: DC
  Administered 2022-12-16 – 2022-12-18 (×5): 1 via ORAL
  Filled 2022-12-15 (×6): qty 1

## 2022-12-15 MED ORDER — ALBUTEROL SULFATE (2.5 MG/3ML) 0.083% IN NEBU: 2.5000 mg | INHALATION_SOLUTION | Freq: Four times a day (QID) | RESPIRATORY_TRACT | Status: DC | PRN

## 2022-12-15 MED ORDER — CLOPIDOGREL BISULFATE 75 MG PO TABS
75.0000 mg | ORAL_TABLET | Freq: Every day | ORAL | Status: DC
  Administered 2022-12-16 – 2022-12-19 (×4): 75 mg via ORAL
  Filled 2022-12-15 (×4): qty 1

## 2022-12-15 MED ORDER — ENOXAPARIN SODIUM 60 MG/0.6ML IJ SOSY
0.5000 mg/kg | PREFILLED_SYRINGE | INTRAMUSCULAR | Status: DC
  Administered 2022-12-16 – 2022-12-19 (×4): 50 mg via SUBCUTANEOUS
  Filled 2022-12-15 (×4): qty 0.6

## 2022-12-15 MED ORDER — INSULIN DETEMIR 100 UNIT/ML ~~LOC~~ SOLN
15.0000 [IU] | Freq: Every day | SUBCUTANEOUS | Status: DC
  Administered 2022-12-16 – 2022-12-18 (×3): 15 [IU] via SUBCUTANEOUS
  Filled 2022-12-15 (×5): qty 0.15

## 2022-12-15 MED ORDER — POTASSIUM CHLORIDE CRYS ER 20 MEQ PO TBCR
20.0000 meq | EXTENDED_RELEASE_TABLET | Freq: Every day | ORAL | Status: DC
  Administered 2022-12-16 – 2022-12-17 (×2): 20 meq via ORAL
  Filled 2022-12-15 (×2): qty 1

## 2022-12-15 MED ORDER — MORPHINE SULFATE (PF) 2 MG/ML IV SOLN
2.0000 mg | Freq: Once | INTRAVENOUS | Status: AC
  Administered 2022-12-15: 2 mg via INTRAVENOUS
  Filled 2022-12-15: qty 1

## 2022-12-15 MED ORDER — ONDANSETRON HCL 4 MG/2ML IJ SOLN
4.0000 mg | Freq: Four times a day (QID) | INTRAMUSCULAR | Status: DC | PRN
  Administered 2022-12-18: 4 mg via INTRAVENOUS
  Filled 2022-12-15: qty 2

## 2022-12-15 MED ORDER — PANTOPRAZOLE SODIUM 40 MG PO TBEC
40.0000 mg | DELAYED_RELEASE_TABLET | Freq: Two times a day (BID) | ORAL | Status: DC
  Administered 2022-12-16 – 2022-12-19 (×7): 40 mg via ORAL
  Filled 2022-12-15 (×7): qty 1

## 2022-12-15 MED ORDER — NITROGLYCERIN 0.4 MG SL SUBL
0.4000 mg | SUBLINGUAL_TABLET | Freq: Once | SUBLINGUAL | Status: AC
  Administered 2022-12-15: 0.4 mg via SUBLINGUAL

## 2022-12-15 NOTE — Progress Notes (Signed)
PHARMACIST - PHYSICIAN COMMUNICATION  CONCERNING:  Enoxaparin (Lovenox) for DVT Prophylaxis    RECOMMENDATION: Patient was prescribed enoxaprin '40mg'$  q24 hours for VTE prophylaxis.   Filed Weights   12/15/22 2242  Weight: 100.2 kg (221 lb)    Body mass index is 33.6 kg/m.  Estimated Creatinine Clearance: 36.4 mL/min (A) (by C-G formula based on SCr of 1.7 mg/dL (H)).   Based on [place] patient is candidate for enoxaparin 0.'5mg'$ /kg TBW SQ every 24 hours based on BMI being >30.  DESCRIPTION: Pharmacy has adjusted enoxaparin dose per [facility] policy.  Patient is now receiving enoxaparin 0.5 mg/kg every 24 hours   Renda Rolls, PharmD, [facility] 12/15/2022 10:44 PM

## 2022-12-15 NOTE — ED Triage Notes (Signed)
Patient to ED via [facility] from home for CP and SOB x1 week. Hx of pacemaker, hypertension. EMS gave 1 spray nitro and 324 aspirin.  228 cbg 161/99 88 HR

## 2022-12-15 NOTE — ED Notes (Signed)
ED Provider at bedside. 

## 2022-12-15 NOTE — ED Provider Notes (Signed)
[facility] Provider Note    Event Date/Time   First MD Initiated Contact with Patient 12/15/22 1751     (approximate)   History   Chest Pain   HPI  [name] is a 88 y.o. male past medical his history of heart failure reduced EF 30%, coronary artery disease status post CABG in 2021, CKD who presents with chest pain and shortness of breath.  Patient tells me he has been having a tight feeling in his chest for about a week.  He is also had increasing dyspnea during this time.  Since last night the tight feeling has worsened as is his breathing.  Pain does not radiate is been constant it is worse with taking deep breath and with exertion.  He has also had cough productive of clear sputum.  Denies fevers or chills.  Denies nausea or diaphoresis.  Does endorse increasing lower extremity swelling.  Takes torsemide as needed has been taking it.  Patient tells me that this tight feeling in his chest feels similar to when he needed CABG.  Outside of this he does not have chest pain frequently and is not typically short of breath on exertion.     Past Medical History:  Diagnosis Date   AV block, [place] 1    CAD (coronary artery disease)    a. 08/2019 VF arrest-->sev 3vd on cath-->CABGx5 (LIMA->LAD, VG->OM->LCX, VG->RPDA, VG->Diag); b. 08/2021 Cath: sev native multivessel dzs w/ 5/5 patent grafts. Nl filling pressures->Med rx.   CKD (chronic kidney disease), stage III (HCC)    Diabetes mellitus without complication ([place])    HFrEF (heart failure with reduced ejection fraction) ([place])    a. 08/2020 Echo: EF 40-45%; b. 11/2020 Echo: EF 55%, no rwma, mild LVH, Gr2 DD. nl RV fxn; c. 08/2021 Echo: EF 35-40%, glob HK w/ ant/antsept/apical HK. Reduced RV fxn. Mildly dil LA; d. 10/2021 s/p CRT-P; e. 12/2021 Echo: EF 30-35%, glob HK, GrIII DD. Mod red RV fxn, mild LAE, mild MR, AoV sclerosis.   Hyperlipidemia LDL goal <70    Hypertension    Intermittent Complete heart block ([place])     a. 08/2021 noted on Zio; b. 10/2021 s/p Abbott Allure RF CRT-P (Ser # [id number]).   Ischemic cardiomyopathy    a. 08/2020 Echo: EF 40-45%; b. 11/2020 Echo: EF 55%; c. 08/2021 Echo: EF 35-40%; d. 10/2021 s/p CRT-P; e. 12/2021 Echo: EF 30-35%, glob HK, GrIII DD.   Trifascicular block     Patient Active Problem List   Diagnosis Date Noted   CHF exacerbation ([place]) 12/15/2022   Hypertensive urgency 03/29/2022   Dyslipidemia 03/29/2022   GERD without esophagitis 03/29/2022   Type 2 diabetes mellitus with chronic kidney disease, with long-term current use of insulin ([place]) 03/29/2022   Coronary artery disease without angina pectoris 03/29/2022   Elevated troponin 03/29/2022   Acute CHF (congestive heart failure) ([place]) 03/28/2022   Acute on chronic systolic CHF (congestive heart failure) ([place]) 01/04/2022   Acute on chronic combined systolic and diastolic CHF (congestive heart failure) ([place]) 10/30/2021   Dilated cardiomyopathy ([place]) 10/30/2021   Acute decompensated heart failure ([place]) 10/27/2021   Complete heart block ([place]) 10/12/2021   Junctional bradycardia    Acute on chronic systolic congestive heart failure (HCC)    Acute on chronic diastolic CHF (congestive heart failure) ([place]) 08/26/2021   CAD (coronary artery disease)    HLD (hyperlipidemia)    GERD (gastroesophageal reflux disease)    Type II diabetes mellitus with  renal manifestations (HCC)    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   CAD, multiple vessel 08/25/2020   Non-ST elevation (NSTEMI) myocardial infarction ([place]) 08/21/2020   History of cardiac arrest 08/21/2020   HFrEF (heart failure with reduced ejection fraction) ([place])    History of sustained ventricular fibrillation 08/18/2020   Acute respiratory failure with hypoxia (HCC)    Chronic diastolic heart failure (HCC)    Stage 3a chronic kidney disease ([place]) 11/20/2019   UTI (urinary tract infection) 07/26/2019   HTN (hypertension) 07/26/2019   Diabetes ([place]) 07/26/2019   Unstable  angina ([place]) 12/03/2008   Status post percutaneous transluminal coronary angioplasty 10/08/2008   Malignant neoplasm of prostate ([place]) 10/05/2005   Essential hypertension 08/24/1989     Physical Exam  Triage Vital Signs: ED Triage Vitals [12/15/22 1405]  Enc Vitals Group     BP (!) 163/105     Pulse Rate 63     Resp 18     Temp 97.8 F (36.6 C)     Temp Source Oral     SpO2 98 %     Weight      Height      Head Circumference      Peak Flow      Pain Score 6     Pain Loc      Pain Edu?      Excl. in [place]?     Most recent vital signs: Vitals:   12/15/22 2230 12/15/22 2300  BP: (!) 176/92 (!) 147/86  Pulse: 70 74  Resp: 18 15  Temp:    SpO2: 96% 97%     General: Awake, patient looks dyspneic CV:  Good peripheral perfusion.  Pitting edema bilateral lower extremities Resp:  Patient has conversational dyspnea but able to speak in full sentences, is using abdomen to breathe lung sounds are diminished throughout with occasional expiratory wheeze Abd:  No distention.  Neuro:             Awake, Alert, Oriented x 3  Other:     ED Results / Procedures / Treatments  Labs (all labs ordered are listed, but only abnormal results are displayed) Labs Reviewed  BASIC METABOLIC PANEL - Abnormal; Notable for the following components:      Result Value   CO2 20 (*)    Glucose, Bld 161 (*)    BUN 29 (*)    Creatinine, Ser 1.70 (*)    Calcium 8.4 (*)    GFR, Estimated 39 (*)    All other components within normal limits  CBC - Abnormal; Notable for the following components:   RDW 17.1 (*)    All other components within normal limits  CBG MONITORING, ED - Abnormal; Notable for the following components:   Glucose-Capillary 122 (*)    All other components within normal limits  TROPONIN I (HIGH SENSITIVITY) - Abnormal; Notable for the following components:   Troponin I (High Sensitivity) 18 (*)    All other components within normal limits  TROPONIN I (HIGH SENSITIVITY) -  Abnormal; Notable for the following components:   Troponin I (High Sensitivity) 23 (*)    All other components within normal limits  RESP PANEL BY RT-PCR (RSV, FLU A&B, COVID)  RVPGX2  PROTIME-INR  HEMOGLOBIN U8E  BASIC METABOLIC PANEL  CBC     EKG  EKG shows paced rhythm, AV paced, PVC, no changes to suggest acute ischemia, similar in appearance to prior EKG   RADIOLOGY Chest x-ray reviewed interpreted  myself shows pacemaker in place, pulmonary vascular congestion   PROCEDURES:  Critical Care performed: Yes, see critical care procedure note(s)  Procedures  The patient is on the cardiac monitor to evaluate for evidence of arrhythmia and/or significant heart rate changes.   MEDICATIONS ORDERED IN ED: Medications  nitroGLYCERIN (NITROGLYN) 2 % ointment 1 inch (1 inch Topical Not Given 12/15/22 1839)  carvedilol (COREG) tablet 6.25 mg (has no administration in time range)  sacubitril-valsartan (ENTRESTO) 24-26 mg per tablet (has no administration in time range)  hydrALAZINE (APRESOLINE) tablet 25 mg (has no administration in time range)  isosorbide mononitrate (IMDUR) 24 hr tablet 30 mg (has no administration in time range)  rosuvastatin (CRESTOR) tablet 40 mg (has no administration in time range)  insulin detemir (LEVEMIR) injection 15 Units (has no administration in time range)  dapagliflozin propanediol (FARXIGA) tablet 10 mg (has no administration in time range)  magnesium oxide (MAG-OX) tablet 400 mg (has no administration in time range)  pantoprazole (PROTONIX) EC tablet 40 mg (has no administration in time range)  clopidogrel (PLAVIX) tablet 75 mg (has no administration in time range)  potassium chloride SA (KLOR-CON M) CR tablet 20 mEq (has no administration in time range)  enoxaparin (LOVENOX) injection 50 mg (has no administration in time range)  acetaminophen (TYLENOL) tablet 650 mg (has no administration in time range)    Or  acetaminophen (TYLENOL) suppository  650 mg (has no administration in time range)  ondansetron (ZOFRAN) tablet 4 mg (has no administration in time range)    Or  ondansetron (ZOFRAN) injection 4 mg (has no administration in time range)  furosemide (LASIX) injection 40 mg (has no administration in time range)  insulin aspart (novoLOG) injection 0-5 Units ( Subcutaneous Not Given 12/15/22 2241)  insulin aspart (novoLOG) injection 0-15 Units (has no administration in time range)  albuterol (PROVENTIL) (2.5 MG/3ML) 0.083% nebulizer solution 2.5 mg (has no administration in time range)  nitroGLYCERIN (NITROSTAT) SL tablet 0.4 mg (0.4 mg Sublingual Given 12/15/22 1839)  furosemide (LASIX) injection 40 mg (40 mg Intravenous Given 12/15/22 1839)  morphine (PF) 2 MG/ML injection 2 mg (2 mg Intravenous Given 12/15/22 1840)  iohexol (OMNIPAQUE) 350 MG/ML injection 60 mL (60 mLs Intravenous Contrast Given 12/15/22 1900)  nitroGLYCERIN (NITROGLYN) 2 % ointment 1 inch (1 inch Topical Given 12/15/22 2001)     IMPRESSION / MDM / ASSESSMENT AND PLAN / ED COURSE  I reviewed the triage vital signs and the nursing notes.                              Patient's presentation is most consistent with acute presentation with potential threat to life or bodily function.  Differential diagnosis includes, but is not limited to, ACS, CHF exacerbation, pneumonia, myocarditis/pericarditis, pulmonary embolism   The patient is a 88 year old male with history of CABG, heart failure reduced ejection fraction who presents with 1 week of chest pain dyspnea that is worsened over the last day.  Describes his pain as tight feeling that does not radiate has been constant but is worse with exertion and with deep inspiration.  It is worsened since yesterday.  He is also quite short of breath especially with any exertion.  Says that all of this is new.  The only time he had similar pain was when he had CABG.  Patient has had cough productive of clear sputum.  Has been compliant with  his medications.  Takes torsemide  just as needed but has been taking over the last several days.  On my evaluation patient does look dyspneic but is able to speak in full sentences lung sounds are diminished with some wheezing he has pitting edema blood pressure is quite elevated in the 180s.  EKG shows AV paced rhythm without obvious acute ischemic changes.  First troponin is 18 we will repeat.  X-ray shows pulmonary edema, not significantly different from prior.  Patient certainly high risk for ACS and I do feel that he is likely having some exacerbation of his ACS.  Will get his blood pressure down with some nitroglycerin and Nitropaste will treat pain with morphine.  He already received aspirin with EMS.  Will give a dose of Lasix as well.  Given the constant pleuritic nature of the pain we will get CTA to rule out pulmonary embolism.  Anticipate that with patient's risk factors he will need admission for observation at the very least.  CTA is negative.  Patient feeling somewhat improved after meds but still having chest tightness.  Blood pressure has improved to 992 systolic.  Given ongoing chest pain is high risk patient I did discuss with Dr. Damita Dunnings who will admit the patient for observation.     FINAL CLINICAL IMPRESSION(S) / ED DIAGNOSES   Final diagnoses:  Chest pain, unspecified type     Rx / DC Orders   ED Discharge Orders     None        Note:  This document was prepared using Dragon voice recognition software and may include unintentional dictation errors.   Rada Hay, MD 12/15/22 [phone]

## 2022-12-15 NOTE — H&P (Signed)
History and Physical    Patient: [name] EHU:[id number] DOB: 03-Sep-1935 DOA: 12/15/2022 DOS: the patient was seen and examined on 12/15/2022 PCP: Theora Gianotti, NP  Patient coming from: Home  Chief Complaint:  Chief Complaint  Patient presents with   Chest Pain    HPI: [name] is a 88 y.o. 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  Symptoms worsened in the past 1 day thus prompting the visit to the ED.  His chest pain is described as a tightness and it is not radiating.  He has wheezing and lower extremity edema and has been using his torsemide as needed and more over the past several days.  Also has a cough without sputum production and denies fever or chills.  Denies leg pains.. ED course and data review: BP 163/105 on arrival with otherwise normal vitals.  Troponin 23 and BNP pending.  CBC unremarkable as well as BMP.  Respiratory viral panel negative for COVID flu and influenza.  EKG, personally viewed and interpreted shows paced rhythm at 66 without ischemic changes CTA chest for the most part unremarkable, negative for PE and showing small bilateral pleural effusions. Patient treated with Lasix and Nitropaste and hospitalist consulted for admission for CHF exacerbation and high risk chest pain  Review of Systems: As mentioned in the history of present illness. All other systems reviewed and are negative.  Past Medical History:  Diagnosis Date   AV block, [place] 1    CAD (coronary artery disease)    a. 08/2019 VF arrest-->sev 3vd on cath-->CABGx5 (LIMA->LAD, VG->OM->LCX, VG->RPDA, VG->Diag); b. 08/2021 Cath: sev native multivessel dzs w/ 5/5 patent grafts. Nl filling pressures->Med rx.   CKD (chronic  kidney disease), stage III (HCC)    Diabetes mellitus without complication ([place])    HFrEF (heart failure with reduced ejection fraction) ([place])    a. 08/2020 Echo: EF 40-45%; b. 11/2020 Echo: EF 55%, no rwma, mild LVH, Gr2 DD. nl RV fxn; c. 08/2021 Echo: EF 35-40%, glob HK w/ ant/antsept/apical HK. Reduced RV fxn. Mildly dil LA; d. 10/2021 s/p CRT-P; e. 12/2021 Echo: EF 30-35%, glob HK, GrIII DD. Mod red RV fxn, mild LAE, mild MR, AoV sclerosis.   Hyperlipidemia LDL goal <70    Hypertension    Intermittent Complete heart block ([place])    a. 08/2021 noted on Zio; b. 10/2021 s/p Abbott Allure RF CRT-P (Ser # [id number]).   Ischemic cardiomyopathy    a. 08/2020 Echo: EF 40-45%; b. 11/2020 Echo: EF 55%; c. 08/2021 Echo: EF 35-40%; d. 10/2021 s/p CRT-P; e. 12/2021 Echo: EF 30-35%, glob HK, GrIII DD.   Trifascicular block    Past Surgical History:  Procedure Laterality Date   BACK SURGERY     CARDIAC CATHETERIZATION     CLIPPING OF ATRIAL APPENDAGE N/A 08/29/2020   Procedure: CLIPPING OF ATRIAL APPENDAGE USING ATRICURE 44 MM ATRICLIP FLEX-V;  Surgeon: Grace Isaac, MD;  Location: [place];  Service: Open Heart Surgery;  Laterality: N/A;   CORONARY ARTERY BYPASS GRAFT N/A 08/29/2020   Procedure: CORONARY ARTERY BYPASS GRAFTING (CABG) X 5 USING LEFT INTERNAL MAMMARY ARTERY AND ENDOSCOPICALLY HARVESTED RIGHT GREATER SAPHENOUS VEIN. LIMA TO LAD, SVG TO OM SEQ TO CIRC, SVG TO PD, SVG  TO DIAG.;  Surgeon: Grace Isaac, MD;  Location: [place];  Service: Open Heart Surgery;  Laterality: N/A;   ENDOVEIN HARVEST OF GREATER SAPHENOUS VEIN Right 08/29/2020   Procedure: ENDOVEIN HARVEST OF GREATER SAPHENOUS VEIN;  Surgeon: Grace Isaac, MD;  Location: [place];  Service: Open Heart Surgery;  Laterality: Right;   HERNIA REPAIR     LEFT HEART CATH AND CORONARY ANGIOGRAPHY N/A 08/25/2020   Procedure: LEFT HEART CATH AND CORONARY ANGIOGRAPHY;  Surgeon: [name], MD;  Location: [facility];   Service: Cardiovascular;  Laterality: N/A;   PACEMAKER IMPLANT N/A 10/12/2021   Procedure: PACEMAKER IMPLANT;  Surgeon: Vickie Epley, MD;  Location: [facility];  Service: Cardiovascular;  Laterality: N/A;   RIGHT/LEFT HEART CATH AND CORONARY ANGIOGRAPHY N/A 08/31/2021   Procedure: RIGHT/LEFT HEART CATH AND CORONARY ANGIOGRAPHY;  Surgeon: [name], MD;  Location: [facility];  Service: Cardiovascular;  Laterality: N/A;   TEE WITHOUT CARDIOVERSION N/A 08/29/2020   Procedure: TRANSESOPHAGEAL ECHOCARDIOGRAM (TEE);  Surgeon: Grace Isaac, MD;  Location: [place];  Service: Open Heart Surgery;  Laterality: N/A;   Social History:  reports that he quit smoking about 26 years ago. His smoking use included cigarettes. He has never used smokeless tobacco. He reports that he does not currently use alcohol. He reports that he does not use drugs.  Allergies  Allergen Reactions   Angiotensin Receptor Blockers     hyperkalemia   Metformin Diarrhea   Spironolactone     Hyperkalemia     Family History  Problem Relation Age of Onset   Heart attack Mother     Prior to Admission medications   Medication Sig Start Date End Date Taking? Authorizing Provider  acetaminophen (TYLENOL) 500 MG tablet Take 1,000 mg by mouth every 6 (six) hours as needed for mild pain, fever or headache.    [provider]  acyclovir (ZOVIRAX) 800 MG tablet Take 800 mg by mouth daily. 12/02/20   [provider]  albuterol (VENTOLIN HFA) 108 (90 Base) MCG/ACT inhaler Inhale 2 puffs into the lungs every 6 (six) hours as needed for wheezing or shortness of breath. 09/30/21   [provider]  carvedilol (COREG) 6.25 MG tablet Take 1 tablet (6.25 mg total) by mouth 2 (two) times daily with a meal. 11/17/22 11/12/23  Theora Gianotti, NP  clopidogrel (PLAVIX) 75 MG tablet TAKE 1 TABLET BY MOUTH EVERY DAY 05/17/21   Loel Dubonnet, NP  dapagliflozin propanediol (FARXIGA)  10 MG TABS tablet Take 10 mg by mouth daily.    [provider]  ENTRESTO 24-26 MG Take 1 tablet by mouth 2 (two) times daily. 11/17/22   Theora Gianotti, NP  fluticasone (FLOVENT HFA) 110 MCG/ACT inhaler Inhale 1 puff into the lungs daily.    [provider]  hydrALAZINE (APRESOLINE) 25 MG tablet Take 25 mg by mouth 2 times daily at 12 noon and 4 pm.    [provider]  insulin detemir (LEVEMIR) 100 UNIT/ML injection Inject 0.15 mLs (15 Units total) into the skin at bedtime. 10/30/21   Enzo Bi, MD  isosorbide mononitrate (IMDUR) 30 MG 24 hr tablet Take 1 tablet (30 mg total) by mouth daily. 11/25/21   Alisa Graff, FNP  magnesium oxide (MAG-OX) 400 MG tablet Take 1 tablet (400 mg total) by mouth daily. 01/20/22   Vickie Epley, MD  pantoprazole (PROTONIX) 40 MG tablet Take 40 mg by  mouth 2 (two) times daily.    [provider]  Potassium Chloride ER 20 MEQ TBCR Take 1 tablet by mouth daily.    [provider]  rosuvastatin (CRESTOR) 40 MG tablet TAKE 1 TABLET BY MOUTH EVERY DAY 12/14/21   [name], MD  torsemide (DEMADEX) 20 MG tablet Take 20 mg by mouth daily as needed (swelling).    [provider]    Physical Exam: Vitals:   12/15/22 1405 12/15/22 1844 12/15/22 1948  BP: (!) 163/105 (!) 174/102 (!) 155/84  Pulse: 63 74   Resp: 18 18   Temp: 97.8 F (36.6 C)    TempSrc: Oral Oral   SpO2: 98% 95%    Physical Exam Vitals and nursing note reviewed.  Constitutional:      General: He is not in acute distress. HENT:     Head: Normocephalic and atraumatic.  Cardiovascular:     Rate and Rhythm: Normal rate and regular rhythm.     Heart sounds: Normal heart sounds.  Pulmonary:     Effort: Pulmonary effort is normal.     Breath sounds: Normal breath sounds.  Abdominal:     Palpations: Abdomen is soft.     Tenderness: There is no abdominal tenderness.  Musculoskeletal:     Right lower leg: Edema present.      Left lower leg: Edema present.  Neurological:     Mental Status: Mental status is at baseline.     Labs on Admission: I have personally reviewed following labs and imaging studies  CBC: Recent Labs  Lab 12/15/22 1407  WBC 5.9  HGB 14.8  HCT 49.1  MCV 87.1  PLT 563   Basic Metabolic Panel: Recent Labs  Lab 12/15/22 1407  NA 142  K 4.2  CL 109  CO2 20*  GLUCOSE 161*  BUN 29*  CREATININE 1.70*  CALCIUM 8.4*   GFR: CrCl cannot be calculated (Unknown ideal weight.). Liver Function Tests: No results for input(s): "AST", "ALT", "ALKPHOS", "BILITOT", "PROT", "ALBUMIN" in the last 168 hours. No results for input(s): "LIPASE", "AMYLASE" in the last 168 hours. No results for input(s): "AMMONIA" in the last 168 hours. Coagulation Profile: Recent Labs  Lab 12/15/22 1407  INR 1.1   Cardiac Enzymes: No results for input(s): "CKTOTAL", "CKMB", "CKMBINDEX", "TROPONINI" in the last 168 hours. BNP (last 3 results) No results for input(s): "PROBNP" in the last 8760 hours. HbA1C: No results for input(s): "HGBA1C" in the last 72 hours. CBG: No results for input(s): "GLUCAP" in the last 168 hours. Lipid Profile: No results for input(s): "CHOL", "HDL", "LDLCALC", "TRIG", "CHOLHDL", "LDLDIRECT" in the last 72 hours. Thyroid Function Tests: No results for input(s): "TSH", "T4TOTAL", "FREET4", "T3FREE", "THYROIDAB" in the last 72 hours. Anemia Panel: No results for input(s): "VITAMINB12", "FOLATE", "FERRITIN", "TIBC", "IRON", "RETICCTPCT" in the last 72 hours. Urine analysis:    Component Value Date/Time   COLORURINE YELLOW 09/01/2020 2035   APPEARANCEUR HAZY (A) 09/01/2020 2035   LABSPEC 1.010 09/01/2020 2035   PHURINE 5.0 09/01/2020 2035   GLUCOSEU NEGATIVE 09/01/2020 2035   HGBUR MODERATE (A) 09/01/2020 2035   BILIRUBINUR NEGATIVE 09/01/2020 2035   KETONESUR NEGATIVE 09/01/2020 2035   PROTEINUR NEGATIVE 09/01/2020 2035   NITRITE NEGATIVE 09/01/2020 2035   LEUKOCYTESUR  TRACE (A) 09/01/2020 2035    Radiological Exams on Admission: CT Angio Chest PE W and/or Wo Contrast  Result Date: 12/15/2022 CLINICAL DATA:  Chest pain, shortness of breath EXAM: CT ANGIOGRAPHY CHEST WITH CONTRAST TECHNIQUE:  Multidetector CT imaging of the chest was performed using the standard protocol during bolus administration of intravenous contrast. Multiplanar CT image reconstructions and MIPs were obtained to evaluate the vascular anatomy. RADIATION DOSE REDUCTION: This exam was performed according to the departmental dose-optimization program which includes automated exposure control, adjustment of the mA and/or kV according to patient size and/or use of iterative reconstruction technique. CONTRAST:  76m OMNIPAQUE IOHEXOL 350 MG/ML SOLN COMPARISON:  08/18/2020 FINDINGS: Cardiovascular: No filling defects in the pulmonary arteries to suggest pulmonary emboli. Cardiomegaly. No evidence of aortic aneurysm. Prior CABG. Aortic atherosclerosis. Mediastinum/Nodes: Mildly prominent mediastinal lymph nodes, similar to prior study, likely reactive. No axillary or hilar adenopathy. Lungs/Pleura: Small bilateral pleural effusions. No confluent opacities. Upper Abdomen: No acute findings Musculoskeletal: Chest wall soft tissues are unremarkable. No acute bony abnormality. 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 Review of the MIP images confirms the above findings. IMPRESSION: No evidence of pulmonary embolus. Cardiomegaly, prior CABG. Small bilateral pleural effusions. Aortic Atherosclerosis (ICD10-I70.0). Electronically Signed   By: KRolm BaptiseM.D.   On: 12/15/2022 19:35   DG Chest 2 View  Result Date: 12/15/2022 CLINICAL DATA:  Chest pain and shortness of breath. EXAM: CHEST - 2 VIEW COMPARISON:  03/28/2022 and prior CTA of the chest on 08/18/2020 FINDINGS: Stable cardiac enlargement, evidence of CABG, appearance of left  atrial appendage clip and biventricular pacemaker. Chronic pulmonary interstitial edema present. Probable trace left pleural fluid. No pneumothorax. Stable lobulated calcification adjacent to the right scapula shown previously to represent posterior chest wall calcification between the ribs and scapula. IMPRESSION: Stable cardiac enlargement and chronic pulmonary interstitial edema. Probable trace left pleural fluid. Electronically Signed   By: GAletta EdouardM.D.   On: 12/15/2022 15:02     Data Reviewed: Relevant notes from primary care and specialist visits, past discharge summaries as available in EHR, including Care Everywhere. Prior diagnostic testing as pertinent to current admission diagnoses Updated medications and problem lists for reconciliation ED course, including vitals, labs, imaging, treatment and response to treatment Triage notes, nursing and pharmacy notes and ED provider's notes Notable results as noted in HPI   Assessment and Plan:   Acute on chronic systolic and diastolic congestive heart failure Ischemic cardiomyopathy Hypertensive emergency Patient presents with chest pain and shortness of breath with SBP in the 180s to 190s EF 30 to 35% with grade 3 diastolic dysfunction in February 2023 CTA with small bilateral pleural effusions IV Lasix Continue Coreg, Farxiga, hydralazine and nitrates Daily weights with intake and output monitoring Will repeat echocardiogram   Coronary artery disease s/p CABG times 03/27/2020 Mild elevated troponin History of V-fib arrest in the setting of MI  Most recent catheterization October 2022 with patent graft Suspect supply demand ischemia Continue nitrate, antiplatelets and statin c   Status post CRT-P for intermittent complete heart block/trifascicular block Outpatient PCP/EP follow-up    Hyperlipidemia PTA statin   Stage IIIb chronic kidney disease Relatively stable Monitor while on diuretics     Diabetes  mellitus Continue basal insulin with sliding scale coverage   Bladder spasms PTA oxybutynin   GERD PTA PPI   Obesity This complicates overall care and prognosis     DVT prophylaxis: Lovenox  Consults: [facility], Dr MMyles Gip Advance Care Planning:   Code Status: Prior   Family Communication: none  Disposition Plan: Back to previous home environment  Severity of Illness: The appropriate patient status for  this patient is OBSERVATION. Observation status is judged to be reasonable and necessary in order to provide the required intensity of service to ensure the patient's safety. The patient's presenting symptoms, physical exam findings, and initial radiographic and laboratory data in the context of their medical condition is felt to place them at decreased risk for further clinical deterioration. Furthermore, it is anticipated that the patient will be medically stable for discharge from the hospital within 2 midnights of admission.   Author: Athena Masse, MD 12/15/2022 8:08 PM  For on call review [email or web address].

## 2022-12-15 NOTE — ED Notes (Signed)
Pt to CT

## 2022-12-16 ENCOUNTER — Observation Stay (HOSPITAL_COMMUNITY)
Admit: 2022-12-16 | Discharge: 2022-12-16 | Disposition: A | Discharge status: Home / Self Care | Payer: Medicare HMO | Attending: Internal Medicine | Admitting: Internal Medicine

## 2022-12-16 DIAGNOSIS — I110 Hypertensive heart disease with heart failure: Secondary | ICD-10-CM

## 2022-12-16 DIAGNOSIS — I2510 Atherosclerotic heart disease of native coronary artery without angina pectoris: Secondary | ICD-10-CM | POA: Diagnosis not present

## 2022-12-16 DIAGNOSIS — I5043 Acute on chronic combined systolic (congestive) and diastolic (congestive) heart failure: Secondary | ICD-10-CM

## 2022-12-16 DIAGNOSIS — I25118 Atherosclerotic heart disease of native coronary artery with other forms of angina pectoris: Secondary | ICD-10-CM

## 2022-12-16 DIAGNOSIS — I10 Essential (primary) hypertension: Secondary | ICD-10-CM

## 2022-12-16 DIAGNOSIS — N1831 Chronic kidney disease, stage 3a: Secondary | ICD-10-CM | POA: Diagnosis not present

## 2022-12-16 DIAGNOSIS — I5021 Acute systolic (congestive) heart failure: Secondary | ICD-10-CM | POA: Diagnosis not present

## 2022-12-16 LAB — BASIC METABOLIC PANEL
Anion gap: 7 (ref 5–15)
BUN: 27 mg/dL — ABNORMAL HIGH (ref 8–23)
CO2: 27 mmol/L (ref 22–32)
Calcium: 8.1 mg/dL — ABNORMAL LOW (ref 8.9–10.3)
Chloride: 108 mmol/L (ref 98–111)
Creatinine, Ser: 1.55 mg/dL — ABNORMAL HIGH (ref 0.61–1.24)
GFR, Estimated: 44 mL/min — ABNORMAL LOW (ref >60)
Glucose, Bld: 142 mg/dL — ABNORMAL HIGH (ref 70–99)
Potassium: 3.5 mmol/L (ref 3.5–5.1)
Sodium: 142 mmol/L (ref 135–145)

## 2022-12-16 LAB — ECHOCARDIOGRAM COMPLETE
Height: 68 in
S' Lateral: 5.3 cm
Weight: 3536 oz

## 2022-12-16 LAB — CBC
HCT: 46.3 % (ref 39.0–52.0)
Hemoglobin: 14.1 g/dL (ref 13.0–17.0)
MCH: 26.3 pg (ref 26.0–34.0)
MCHC: 30.5 g/dL (ref 30.0–36.0)
MCV: 86.2 fL (ref 80.0–100.0)
Platelets: 143 10*3/uL — ABNORMAL LOW (ref 150–400)
RBC: 5.37 MIL/uL (ref 4.22–5.81)
RDW: 17.2 % — ABNORMAL HIGH (ref 11.5–15.5)
WBC: 4.8 10*3/uL (ref 4.0–10.5)
nRBC: 0 % (ref 0.0–0.2)

## 2022-12-16 LAB — BRAIN NATRIURETIC PEPTIDE: B Natriuretic Peptide: 3584.8 pg/mL — ABNORMAL HIGH (ref 0.0–100.0)

## 2022-12-16 LAB — HEMOGLOBIN A1C
Hgb A1c MFr Bld: 7.9 % — ABNORMAL HIGH (ref 4.8–5.6)
Mean Plasma Glucose: 180.03 mg/dL

## 2022-12-16 LAB — CBG MONITORING, ED
Glucose-Capillary: 122 mg/dL — ABNORMAL HIGH (ref 70–99)
Glucose-Capillary: 195 mg/dL — ABNORMAL HIGH (ref 70–99)

## 2022-12-16 LAB — GLUCOSE, CAPILLARY
Glucose-Capillary: 127 mg/dL — ABNORMAL HIGH (ref 70–99)
Glucose-Capillary: 145 mg/dL — ABNORMAL HIGH (ref 70–99)

## 2022-12-16 MED ORDER — HYDRALAZINE HCL 25 MG PO TABS
25.0000 mg | ORAL_TABLET | Freq: Once | ORAL | Status: AC
  Administered 2022-12-16: 25 mg via ORAL

## 2022-12-16 NOTE — Progress Notes (Signed)
[place] [facility] [place] NAME: [name]    MR#:  [id number]  DATE OF BIRTH:  1935-03-20  SUBJECTIVE:  no family at bedside. Came in with increasing shortness of breath and leg swelling. Receiving IV Lasix. Good urine output. Patient feels a bit better. Lives with wife, independent, drives.    VITALS:  Blood pressure 135/73, pulse 60, temperature 97.8 F (36.6 C), resp. rate (!) 21, height '5\' 8"'$  (1.727 m), weight 100.2 kg, SpO2 97 %.  PHYSICAL EXAMINATION:   GENERAL:  88 y.o.-year-old patient with no acute distress.  LUNGS: decreasedbreath sounds bilaterally, no wheezing CARDIOVASCULAR: S1, S2 normal. No murmur   ABDOMEN: Soft, nontender, nondistended. Bowel sounds present.  EXTREMITIES: ++  edema b/l.    NEUROLOGIC: nonfocal  patient is alert and awake SKIN: No obvious rash, lesion, or ulcer.   LABORATORY PANEL:  CBC Recent Labs  Lab 12/16/22 0452  WBC 4.8  HGB 14.1  HCT 46.3  PLT 143*    Chemistries  Recent Labs  Lab 12/16/22 0452  NA 142  K 3.5  CL 108  CO2 27  GLUCOSE 142*  BUN 27*  CREATININE 1.55*  CALCIUM 8.1*   Cardiac Enzymes No results for input(s): "TROPONINI" in the last 168 hours. RADIOLOGY:  CT Angio Chest PE W and/or Wo Contrast  Result Date: 12/15/2022 CLINICAL DATA:  Chest pain, shortness of breath EXAM: CT ANGIOGRAPHY CHEST WITH CONTRAST TECHNIQUE: Multidetector CT imaging of the chest was performed using the standard protocol during bolus administration of intravenous contrast. Multiplanar CT image reconstructions and MIPs were obtained to evaluate the vascular anatomy. RADIATION DOSE REDUCTION: This exam was performed according to the departmental dose-optimization program which includes automated exposure control, adjustment of the mA and/or kV according to patient size and/or use of iterative reconstruction technique. CONTRAST:  58m OMNIPAQUE IOHEXOL 350 MG/ML SOLN COMPARISON:  08/18/2020  FINDINGS: Cardiovascular: No filling defects in the pulmonary arteries to suggest pulmonary emboli. Cardiomegaly. No evidence of aortic aneurysm. Prior CABG. Aortic atherosclerosis. Mediastinum/Nodes: Mildly prominent mediastinal lymph nodes, similar to prior study, likely reactive. No axillary or hilar adenopathy. Lungs/Pleura: Small bilateral pleural effusions. No confluent opacities. Upper Abdomen: No acute findings Musculoskeletal: Chest wall soft tissues are unremarkable. No acute bony abnormality. 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 Review of the MIP images confirms the above findings. IMPRESSION: No evidence of pulmonary embolus. Cardiomegaly, prior CABG. Small bilateral pleural effusions. Aortic Atherosclerosis (ICD10-I70.0). Electronically Signed   By: KRolm BaptiseM.D.   On: 12/15/2022 19:35   DG Chest 2 View  Result Date: 12/15/2022 CLINICAL DATA:  Chest pain and shortness of breath. EXAM: CHEST - 2 VIEW COMPARISON:  03/28/2022 and prior CTA of the chest on 08/18/2020 FINDINGS: Stable cardiac enlargement, evidence of CABG, appearance of left atrial appendage clip and biventricular pacemaker. Chronic pulmonary interstitial edema present. Probable trace left pleural fluid. No pneumothorax. Stable lobulated calcification adjacent to the right scapula shown previously to represent posterior chest wall calcification between the ribs and scapula. IMPRESSION: Stable cardiac enlargement and chronic pulmonary interstitial edema. Probable trace left pleural fluid. Electronically Signed   By: GAletta EdouardM.D.   On: 12/15/2022 15:02    Assessment and Plan [name] a 88y.o. male with medical history significant for  VF arrest in the setting of ACS, CAD status post CABG x5 in October 2021, ischemic cardiomyopathy, hypertension, high-grade  heart block status post CRT-P, CKD stage IIIb,: who presented to the ED for  evaluation of 1 week history of chest pain and dyspnea on exertion.   CTA chest for the most part unremarkable, negative for PE and showing small bilateral pleural effusions.   Respiratory viral panel negative for COVID flu and influenza.  Acute on chronic systolic and diastolic congestive heart failure Ischemic cardiomyopathy Hypertensive emergency --Patient presents with chest pain and shortness of breath with SBP in the 180s to 190s --CP resolved. Prn nitro --EF 30 to 35% with grade 3 diastolic dysfunction in February 2023 CTA with small bilateral pleural effusions --IV Lasix 40 mg bid, good UOP --Continue Coreg, Farxiga, Imdur, Enteresto, hydralazine and nitrates --Daily weights with intake and output monitoring --Will repeat echocardiogram --[facility] input noted--defer d/c diuretic dose per cards   Coronary artery disease s/p CABG times 03/27/2020 Mild elevated troponin History of V-fib arrest in the setting of MI --Most recent catheterization October 2022 with patent graft -Suspect supply demand ischemia --Continue nitrate, antiplatelets and statin    Status post CRT-P for intermittent complete heart block/trifascicular block --Outpatient PCP/EP follow-up  Hyperlipidemia PTA statin   Stage IIIb chronic kidney disease --Relatively stable Monitor while on diuretics   Diabetes mellitus --Continue basal insulin with sliding scale coverage   Bladder spasms --PTA oxybutynin   GERD --PTA PPI   Obesity This complicates overall care and prognosis       DVT prophylaxis: Lovenox   Consults: [facility] cardiology   Advance Care Planning:   FULL   Family Communication: none   Disposition Plan: Back to previous home environment in 1-2 days      TOTAL TIME TAKING CARE OF THIS PATIENT: 35 minutes.  >50% time spent on counselling and coordination of care  Note: This dictation was prepared with Dragon dictation along with smaller phrase technology. Any transcriptional  errors that result from this process are unintentional.  [name] M.D    [facility]   CC: Primary care physician; Theora Gianotti, NP

## 2022-12-16 NOTE — Care Management Obs Status (Signed)
[place] NOTIFICATION   Patient Details  Name: [name] MRN: [id number] Date of Birth: [birth year]   Medicare Observation Status Notification Given:  Yes    Shelbie Hutching, RN 12/16/2022, 11:47 AM

## 2022-12-16 NOTE — TOC Initial Note (Signed)
Transition of Care [facility]) - Initial/Assessment Note    Patient Details  Name: [name] MRN: [id number] Date of Birth: [birth year]  Transition of Care [facility]) CM/SW Contact:    Shelbie Hutching, RN Phone Number: 12/16/2022, 11:57 AM  Clinical Narrative:                 Patient placed under observation for CHF exacerbation on acute O2 at 2L.  RNCM met with patient at the bedside in the ED to review MOON.  Patient is from home with his wife, reports being independent and drives.  Wife can pick up at discharge.  Patient is current with PCP and HF Clinic.   No current TOC needs identified.   Expected Discharge Plan: Home/Self Care Barriers to Discharge: Continued Medical Work up   Patient Goals and CMS Choice            Expected Discharge Plan and Services       Living arrangements for the past 2 months: Single Family Home                                      Prior Living Arrangements/Services Living arrangements for the past 2 months: Single Family Home Lives with:: Spouse Patient language and need for interpreter reviewed:: Yes Do you feel safe going back to the place where you live?: Yes      Need for Family Participation in Patient Care: Yes (Comment) Care giver support system in place?: Yes (comment) Current home services: DME (walker/cane) Criminal Activity/Legal Involvement Pertinent to Current Situation/Hospitalization: No - Comment as needed  Activities of Daily Living Home Assistive Devices/Equipment: Eyeglasses ADL Screening (condition at time of admission) Patient's cognitive ability adequate to safely complete daily activities?: Yes Is the patient deaf or have difficulty hearing?: No Does the patient have difficulty seeing, even when wearing glasses/contacts?: No Does the patient have difficulty concentrating, remembering, or making decisions?: No Patient able to express need for assistance with ADLs?: Yes Does the patient have difficulty dressing or  bathing?: No Independently performs ADLs?: No Communication: Independent Dressing (OT): Independent Grooming: Independent Feeding: Independent Bathing: Needs assistance Is this a change from baseline?: Change from baseline, expected to last <3 days Toileting: Needs assistance Is this a change from baseline?: Change from baseline, expected to last <3 days In/Out Bed: Needs assistance Is this a change from baseline?: Change from baseline, expected to last <3 days Walks in Home: Needs assistance Is this a change from baseline?: Change from baseline, expected to last <3 days Does the patient have difficulty walking or climbing stairs?: No Weakness of Legs: Both Weakness of Arms/Hands: None  Permission Sought/Granted                  Emotional Assessment Appearance:: Appears stated age Attitude/Demeanor/Rapport: Engaged Affect (typically observed): Accepting Orientation: : Oriented to Self, Oriented to Place, Oriented to  Time, Oriented to Situation Alcohol / Substance Use: Not Applicable Psych Involvement: No (comment)  Admission diagnosis:  CHF exacerbation ([place]) [I50.9] Patient Active Problem List   Diagnosis Date Noted   CHF exacerbation ([place]) 12/15/2022   Hypertensive urgency 03/29/2022   Dyslipidemia 03/29/2022   GERD without esophagitis 03/29/2022   Type 2 diabetes mellitus with chronic kidney disease, with long-term current use of insulin ([place]) 03/29/2022   Coronary artery disease without angina pectoris 03/29/2022   Elevated troponin 03/29/2022   Acute CHF (congestive heart  failure) ([place]) 03/28/2022   Acute on chronic systolic CHF (congestive heart failure) ([place]) 01/04/2022   Acute on chronic combined systolic and diastolic CHF (congestive heart failure) ([place]) 10/30/2021   Dilated cardiomyopathy ([place]) 10/30/2021   Acute decompensated heart failure ([place]) 10/27/2021   Complete heart block (HCC) 10/12/2021   Junctional bradycardia    Acute on chronic systolic  congestive heart failure (HCC)    Acute on chronic diastolic CHF (congestive heart failure) ([place]) 08/26/2021   CAD (coronary artery disease)    HLD (hyperlipidemia)    GERD (gastroesophageal reflux disease)    Type II diabetes mellitus with renal manifestations (HCC)    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   CAD, multiple vessel 08/25/2020   Non-ST elevation (NSTEMI) myocardial infarction ([place]) 08/21/2020   History of cardiac arrest 08/21/2020   HFrEF (heart failure with reduced ejection fraction) ([place])    History of sustained ventricular fibrillation 08/18/2020   Acute respiratory failure with hypoxia (HCC)    Chronic diastolic heart failure (HCC)    Stage 3a chronic kidney disease ([place]) 11/20/2019   UTI (urinary tract infection) 07/26/2019   HTN (hypertension) 07/26/2019   Diabetes ([place]) 07/26/2019   Unstable angina ([place]) 12/03/2008   Status post percutaneous transluminal coronary angioplasty 10/08/2008   Malignant neoplasm of prostate ([place]) 10/05/2005   Essential hypertension 08/24/1989   PCP:  Theora Gianotti, NP Pharmacy:   [id number] [address] [id number]: [phone]: [phone]    Social Determinants of Health (SDOH) Social History: SDOH Screenings   Food Insecurity: No Food Insecurity (12/16/2022)  Housing: Low Risk  (12/16/2022)  Transportation Needs: No Transportation Needs (12/16/2022)  Utilities: Not At Risk (12/16/2022)  Depression (PHQ2-9): Low Risk  (04/07/2022)  Financial Resource Strain: Low Risk  (07/27/2019)  Physical Activity: Inactive (07/27/2019)  Social Connections: Unknown (07/27/2019)  Stress: No Stress Concern Present (07/27/2019)  Tobacco Use: Medium Risk (12/15/2022)   SDOH Interventions:     Readmission Risk Interventions    03/29/2022    1:18 PM 01/05/2022    2:16 PM 10/29/2021   10:22 AM  Readmission Risk Prevention Plan  Transportation Screening Complete Complete Complete   PCP or Specialist Appt within 3-5 Days Complete Complete Complete  [place] or [place] Complete Complete  Social Work Consult for [place]/Counseling  Complete Complete  Palliative Care Screening Not Applicable Not Applicable Not Applicable  Medication Review (Press photographer Complete Complete Complete

## 2022-12-16 NOTE — Progress Notes (Signed)
*  PRELIMINARY RESULTS* Echocardiogram 2D Echocardiogram has been performed.  [name] 12/16/2022, 8:30 AM

## 2022-12-16 NOTE — ED Notes (Signed)
Called for transport to floor
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
## 2022-12-17 DIAGNOSIS — N1832 Chronic kidney disease, stage 3b: Secondary | ICD-10-CM | POA: Diagnosis not present

## 2022-12-17 DIAGNOSIS — D696 Thrombocytopenia, unspecified: Secondary | ICD-10-CM | POA: Insufficient documentation

## 2022-12-17 DIAGNOSIS — I130 Hypertensive heart and chronic kidney disease with heart failure and stage 1 through stage 4 chronic kidney disease, or unspecified chronic kidney disease: Secondary | ICD-10-CM | POA: Diagnosis present

## 2022-12-17 DIAGNOSIS — Z9861 Coronary angioplasty status: Secondary | ICD-10-CM | POA: Diagnosis not present

## 2022-12-17 DIAGNOSIS — E669 Obesity, unspecified: Secondary | ICD-10-CM | POA: Insufficient documentation

## 2022-12-17 DIAGNOSIS — E1169 Type 2 diabetes mellitus with other specified complication: Secondary | ICD-10-CM

## 2022-12-17 DIAGNOSIS — I10 Essential (primary) hypertension: Secondary | ICD-10-CM | POA: Diagnosis not present

## 2022-12-17 DIAGNOSIS — Z951 Presence of aortocoronary bypass graft: Secondary | ICD-10-CM | POA: Diagnosis not present

## 2022-12-17 DIAGNOSIS — I161 Hypertensive emergency: Secondary | ICD-10-CM | POA: Diagnosis present

## 2022-12-17 DIAGNOSIS — I110 Hypertensive heart disease with heart failure: Secondary | ICD-10-CM | POA: Diagnosis not present

## 2022-12-17 DIAGNOSIS — I252 Old myocardial infarction: Secondary | ICD-10-CM | POA: Diagnosis not present

## 2022-12-17 DIAGNOSIS — Z1152 Encounter for screening for COVID-19: Secondary | ICD-10-CM | POA: Diagnosis not present

## 2022-12-17 DIAGNOSIS — Z8249 Family history of ischemic heart disease and other diseases of the circulatory system: Secondary | ICD-10-CM | POA: Diagnosis not present

## 2022-12-17 DIAGNOSIS — Z87891 Personal history of nicotine dependence: Secondary | ICD-10-CM | POA: Diagnosis not present

## 2022-12-17 DIAGNOSIS — E876 Hypokalemia: Secondary | ICD-10-CM | POA: Insufficient documentation

## 2022-12-17 DIAGNOSIS — Z794 Long term (current) use of insulin: Secondary | ICD-10-CM | POA: Diagnosis not present

## 2022-12-17 DIAGNOSIS — Z950 Presence of cardiac pacemaker: Secondary | ICD-10-CM | POA: Diagnosis not present

## 2022-12-17 DIAGNOSIS — E785 Hyperlipidemia, unspecified: Secondary | ICD-10-CM

## 2022-12-17 DIAGNOSIS — R079 Chest pain, unspecified: Secondary | ICD-10-CM | POA: Diagnosis not present

## 2022-12-17 DIAGNOSIS — I472 Ventricular tachycardia, unspecified: Secondary | ICD-10-CM | POA: Diagnosis not present

## 2022-12-17 DIAGNOSIS — N184 Chronic kidney disease, stage 4 (severe): Secondary | ICD-10-CM

## 2022-12-17 DIAGNOSIS — I255 Ischemic cardiomyopathy: Secondary | ICD-10-CM

## 2022-12-17 DIAGNOSIS — I2510 Atherosclerotic heart disease of native coronary artery without angina pectoris: Secondary | ICD-10-CM | POA: Diagnosis not present

## 2022-12-17 DIAGNOSIS — I509 Heart failure, unspecified: Secondary | ICD-10-CM | POA: Diagnosis present

## 2022-12-17 DIAGNOSIS — Z6834 Body mass index (BMI) 34.0-34.9, adult: Secondary | ICD-10-CM | POA: Diagnosis not present

## 2022-12-17 DIAGNOSIS — I2489 Other forms of acute ischemic heart disease: Secondary | ICD-10-CM | POA: Diagnosis present

## 2022-12-17 DIAGNOSIS — I5023 Acute on chronic systolic (congestive) heart failure: Secondary | ICD-10-CM

## 2022-12-17 DIAGNOSIS — E1122 Type 2 diabetes mellitus with diabetic chronic kidney disease: Secondary | ICD-10-CM | POA: Diagnosis present

## 2022-12-17 DIAGNOSIS — K219 Gastro-esophageal reflux disease without esophagitis: Secondary | ICD-10-CM | POA: Diagnosis present

## 2022-12-17 LAB — BASIC METABOLIC PANEL
Anion gap: 8 (ref 5–15)
BUN: 26 mg/dL — ABNORMAL HIGH (ref 8–23)
CO2: 25 mmol/L (ref 22–32)
Calcium: 7.9 mg/dL — ABNORMAL LOW (ref 8.9–10.3)
Chloride: 107 mmol/L (ref 98–111)
Creatinine, Ser: 1.67 mg/dL — ABNORMAL HIGH (ref 0.61–1.24)
GFR, Estimated: 40 mL/min — ABNORMAL LOW (ref >60)
Glucose, Bld: 125 mg/dL — ABNORMAL HIGH (ref 70–99)
Potassium: 3.2 mmol/L — ABNORMAL LOW (ref 3.5–5.1)
Sodium: 140 mmol/L (ref 135–145)

## 2022-12-17 LAB — GLUCOSE, CAPILLARY
Glucose-Capillary: 128 mg/dL — ABNORMAL HIGH (ref 70–99)
Glucose-Capillary: 180 mg/dL — ABNORMAL HIGH (ref 70–99)
Glucose-Capillary: 117 mg/dL — ABNORMAL HIGH (ref 70–99)
Glucose-Capillary: 126 mg/dL — ABNORMAL HIGH (ref 70–99)

## 2022-12-17 MED ORDER — POTASSIUM CHLORIDE CRYS ER 20 MEQ PO TBCR
40.0000 meq | EXTENDED_RELEASE_TABLET | Freq: Two times a day (BID) | ORAL | Status: AC
  Administered 2022-12-17 – 2022-12-18 (×4): 40 meq via ORAL
  Filled 2022-12-17 (×4): qty 2

## 2022-12-17 NOTE — Assessment & Plan Note (Signed)
Continue Crestor.  On Levemir 15 units nightly and sliding scale insulin.

## 2022-12-17 NOTE — Hospital Course (Signed)
88 y.o. 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  Symptoms worsened in the past 1 day thus prompting the visit to the ED.  His chest pain is described as a tightness and it is not radiating.  He has wheezing and lower extremity edema and has been using his torsemide as needed and more over the past several days.  Also has a cough without sputum production and denies fever or chills.  Denies leg pains.. ED course and data review: BP 163/105 on arrival with otherwise normal vitals.  Troponin 23 and BNP pending.  CBC unremarkable as well as BMP.  Respiratory viral panel negative for COVID flu and influenza.  EKG, personally viewed and interpreted shows paced rhythm at 66 without ischemic changes CTA chest for the most part unremarkable, negative for PE and showing small bilateral pleural effusions. Patient treated with Lasix and Nitropaste and hospitalist consulted for admission for CHF exacerbation and high risk chest pain  2/9.  Patient feeling better with regards to his breathing.  Still has some lower extremity swelling.  Continue Lasix IV. 2/10.  Patient's weight up to 103 kg, was 101.9 yesterday and on admission 100.2.  Increase IV Lasix to 60 mg twice daily and fluid restrict. 2/11.  Patient's weight down to 95.8 kg / 210.76 pounds.  Patient's creatinine 1.9.  Patient feeling better and discharged home in stable condition.  Cardiology recommended torsemide 20 mg daily.

## 2022-12-17 NOTE — Assessment & Plan Note (Signed)
Replace potassium while on IV Lasix

## 2022-12-17 NOTE — Assessment & Plan Note (Signed)
Continue Entresto, Coreg

## 2022-12-17 NOTE — Assessment & Plan Note (Deleted)
Watch closely with diuresis.  Creatinine 1.67.

## 2022-12-17 NOTE — Assessment & Plan Note (Signed)
BMI 34.53

## 2022-12-17 NOTE — Assessment & Plan Note (Signed)
Continue to monitor.  Troponins borderline at 18 and 23.

## 2022-12-17 NOTE — Plan of Care (Signed)
  Problem: Education: Goal: Ability to demonstrate management of disease process will improve Outcome: Progressing Goal: Ability to verbalize understanding of medication therapies will improve Outcome: Progressing   Problem: Cardiac: Goal: Ability to achieve and maintain adequate cardiopulmonary perfusion will improve Outcome: Progressing   Problem: Education: Goal: Knowledge of General Education information will improve Description: Including pain rating scale, medication(s)/side effects and non-pharmacologic comfort measures Outcome: Progressing   Problem: Health Behavior/Discharge Planning: Goal: Ability to manage health-related needs will improve Outcome: Progressing

## 2022-12-17 NOTE — Assessment & Plan Note (Signed)
Continue to monitor

## 2022-12-17 NOTE — Assessment & Plan Note (Signed)
continue Entresto (increased dose) and [place] and Coreg.  Switch to torsemide 20 mg on a daily basis upon going home.  Patient was diuresed with Lasix 40 mg IV twice daily while here in the last day was up to 60 mg twice daily.  Daily weights.  Weight decreased down to 95.8 kg.  Patient advised to weigh himself on a daily basis and call CHF clinic or cardiologist with again in weight of 3 pounds in a day or 5 pounds in a week.

## 2022-12-17 NOTE — Consult Note (Signed)
Rounding Note    Patient Name: [name] Date of Encounter: 12/17/2022  [place] Cardiologist: [name], MD   Subjective   BMET pending. UOP -2.5L Patient overall is feeling better, no further chest pain.   Inpatient Medications    Scheduled Meds:  carvedilol  6.25 mg Oral BID WC   clopidogrel  75 mg Oral Daily   dapagliflozin propanediol  10 mg Oral Daily   enoxaparin (LOVENOX) injection  0.5 mg/kg Subcutaneous Q24H   furosemide  40 mg Intravenous Q12H   hydrALAZINE  25 mg Oral q12n4p   insulin aspart  0-15 Units Subcutaneous TID WC   insulin aspart  0-5 Units Subcutaneous QHS   insulin detemir  15 Units Subcutaneous QHS   isosorbide mononitrate  30 mg Oral Daily   magnesium oxide  400 mg Oral Daily   nitroGLYCERIN  1 inch Topical Once   pantoprazole  40 mg Oral BID   potassium chloride SA  20 mEq Oral Daily   rosuvastatin  40 mg Oral Daily   sacubitril-valsartan  1 tablet Oral BID   Continuous Infusions:  PRN Meds: acetaminophen **OR** acetaminophen, albuterol, ondansetron **OR** ondansetron (ZOFRAN) IV   Vital Signs    Vitals:   12/16/22 2340 12/16/22 2341 12/17/22 0410 12/17/22 0413  BP: (!) 150/88 (!) 150/88 (!) 146/69 136/68  Pulse: 67 64 71 (!) 59  Resp:  20 20 20  $ Temp: 98.4 F (36.9 C) 98.4 F (36.9 C) 98.6 F (37 C) 98.6 F (37 C)  TempSrc: Oral Oral Oral Oral  SpO2: 99% 98% 95% 99%  Weight:      Height:        Intake/Output Summary (Last 24 hours) at 12/17/2022 0737 Last data filed at 12/17/2022 0600 Gross per 24 hour  Intake --  Output 2500 ml  Net -2500 ml      12/15/2022   10:42 PM 11/17/2022   10:46 AM 09/20/2022    9:27 AM  Last 3 Weights  Weight (lbs) 221 lb 224 lb 3.2 oz 224 lb 8 oz  Weight (kg) 100.245 kg 101.696 kg 101.833 kg      Telemetry    AV paced rhythm, Hr 60s - Personally Reviewed  ECG    No new - Personally Reviewed  Physical Exam   GEN: No acute distress.   Neck: No JVD Cardiac: RRR, no  murmurs, rubs, or gallops.  Respiratory: Clear to auscultation bilaterally. GI: Soft, nontender, non-distended  MS: 1+ lower leg edema; No deformity. Neuro:  Nonfocal  Psych: Normal affect   Labs    High Sensitivity Troponin:   Recent Labs  Lab 12/15/22 1407 12/15/22 1820  TROPONINIHS 18* 23*     Chemistry Recent Labs  Lab 12/15/22 1407 12/16/22 0452  NA 142 142  K 4.2 3.5  CL 109 108  CO2 20* 27  GLUCOSE 161* 142*  BUN 29* 27*  CREATININE 1.70* 1.55*  CALCIUM 8.4* 8.1*  GFRNONAA 39* 44*  ANIONGAP 13 7    Lipids No results for input(s): "CHOL", "TRIG", "HDL", "LABVLDL", "LDLCALC", "CHOLHDL" in the last 168 hours.  Hematology Recent Labs  Lab 12/15/22 1407 12/16/22 0452  WBC 5.9 4.8  RBC 5.64 5.37  HGB 14.8 14.1  HCT 49.1 46.3  MCV 87.1 86.2  MCH 26.2 26.3  MCHC 30.1 30.5  RDW 17.1* 17.2*  PLT 164 143*   Thyroid No results for input(s): "TSH", "FREET4" in the last 168 hours.  BNP Recent Labs  Lab  12/16/22 0452  BNP 3,584.8*    DDimer No results for input(s): "DDIMER" in the last 168 hours.   Radiology    ECHOCARDIOGRAM COMPLETE  Result Date: 12/16/2022    ECHOCARDIOGRAM REPORT   Patient Name:   [name] Date of Exam: 12/16/2022 Medical Rec #:  [id number]    Height:       68.0 in Accession #:    [id number]   Weight:       221.0 lb Date of Birth:  [birth year]   BSA:          2.132 m Patient Age:    88 years     BP:           156/71 mmHg Patient Gender: M            HR:           63 bpm. Exam Location:  [facility] Procedure: 2D Echo, Cardiac Doppler and Color Doppler Indications:     CHF-acute systolic [id number]  History:         Patient has prior history of Echocardiogram examinations, most                  recent 01/05/2022. Ischemic cardiomyopathy; Risk                  Factors:Hypertension and Diabetes.  Sonographer:     Sherrie Sport Referring Phys:  [id number] Athena Masse Diagnosing Phys: Ida Rogue MD  Sonographer Comments: Technically challenging study due to  limited acoustic windows, no apical window and no subcostal window. IMPRESSIONS  1. Left ventricular ejection fraction, by estimation, is 30 to 35%. The left ventricle has moderately decreased function. The left ventricle demonstrates global hypokinesis with severe hypokinesis of the anterior wall. The left ventricular internal cavity size was mildly dilated. There is moderate asymmetric left ventricular hypertrophy of the septal segment.  2. Right ventricle is not well visualized. Right ventricular systolic function is grossly normal. The right ventricular size is normal. Tricuspid regurgitation signal is inadequate for assessing PA pressure.  3. Left atrial size was mildly dilated.  4. The mitral valve is normal in structure. Trivial mitral valve regurgitation. No evidence of mitral stenosis.  5. The aortic valve is normal in structure. Aortic valve regurgitation is not visualized. Aortic valve sclerosis is present, with no evidence of aortic valve stenosis.  6. The inferior vena cava is normal in size with greater than 50% respiratory variability, suggesting right atrial pressure of 3 mmHg. FINDINGS  Left Ventricle: Left ventricular ejection fraction, by estimation, is 30 to 35%. The left ventricle has moderately decreased function. The left ventricle demonstrates global hypokinesis. The left ventricular internal cavity size was mildly dilated. There is moderate asymmetric left ventricular hypertrophy of the septal segment. Left ventricular diastolic parameters are indeterminate. Right Ventricle: The right ventricular size is normal. No increase in right ventricular wall thickness. Right ventricular systolic function is normal. Tricuspid regurgitation signal is inadequate for assessing PA pressure. Left Atrium: Left atrial size was mildly dilated. Right Atrium: Right atrial size was normal in size. Pericardium: There is no evidence of pericardial effusion. Mitral Valve: The mitral valve is normal in structure.  There is mild thickening of the mitral valve leaflet(s). Trivial mitral valve regurgitation. No evidence of mitral valve stenosis. Tricuspid Valve: The tricuspid valve is normal in structure. Tricuspid valve regurgitation is not demonstrated. No evidence of tricuspid stenosis. Aortic Valve: The aortic valve is normal in structure. Aortic valve regurgitation is  not visualized. Aortic valve sclerosis is present, with no evidence of aortic valve stenosis. Pulmonic Valve: The pulmonic valve was normal in structure. Pulmonic valve regurgitation is not visualized. No evidence of pulmonic stenosis. Aorta: The aortic root is normal in size and structure. Venous: The inferior vena cava is normal in size with greater than 50% respiratory variability, suggesting right atrial pressure of 3 mmHg. IAS/Shunts: No atrial level shunt detected by color flow Doppler. Additional Comments: A device lead is visualized.  LEFT VENTRICLE PLAX 2D LVIDd:         5.60 cm LVIDs:         5.30 cm LV PW:         1.00 cm LV IVS:        1.70 cm LVOT diam:     2.00 cm LVOT Area:     3.14 cm  LEFT ATRIUM         Index LA diam:    4.20 cm 1.97 cm/m   AORTA Ao Root diam: 2.90 cm  SHUNTS Systemic Diam: 2.00 cm Ida Rogue MD Electronically signed by Ida Rogue MD Signature Date/Time: 12/16/2022/4:40:45 PM    Final    CT Angio Chest PE W and/or Wo Contrast  Result Date: 12/15/2022 CLINICAL DATA:  Chest pain, shortness of breath EXAM: CT ANGIOGRAPHY CHEST WITH CONTRAST TECHNIQUE: Multidetector CT imaging of the chest was performed using the standard protocol during bolus administration of intravenous contrast. Multiplanar CT image reconstructions and MIPs were obtained to evaluate the vascular anatomy. RADIATION DOSE REDUCTION: This exam was performed according to the departmental dose-optimization program which includes automated exposure control, adjustment of the mA and/or kV according to patient size and/or use of iterative reconstruction  technique. CONTRAST:  33m OMNIPAQUE IOHEXOL 350 MG/ML SOLN COMPARISON:  08/18/2020 FINDINGS: Cardiovascular: No filling defects in the pulmonary arteries to suggest pulmonary emboli. Cardiomegaly. No evidence of aortic aneurysm. Prior CABG. Aortic atherosclerosis. Mediastinum/Nodes: Mildly prominent mediastinal lymph nodes, similar to prior study, likely reactive. No axillary or hilar adenopathy. Lungs/Pleura: Small bilateral pleural effusions. No confluent opacities. Upper Abdomen: No acute findings Musculoskeletal: Chest wall soft tissues are unremarkable. No acute bony abnormality. 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 Review of the MIP images confirms the above findings. IMPRESSION: No evidence of pulmonary embolus. Cardiomegaly, prior CABG. Small bilateral pleural effusions. Aortic Atherosclerosis (ICD10-I70.0). Electronically Signed   By: KRolm BaptiseM.D.   On: 12/15/2022 19:35   DG Chest 2 View  Result Date: 12/15/2022 CLINICAL DATA:  Chest pain and shortness of breath. EXAM: CHEST - 2 VIEW COMPARISON:  03/28/2022 and prior CTA of the chest on 08/18/2020 FINDINGS: Stable cardiac enlargement, evidence of CABG, appearance of left atrial appendage clip and biventricular pacemaker. Chronic pulmonary interstitial edema present. Probable trace left pleural fluid. No pneumothorax. Stable lobulated calcification adjacent to the right scapula shown previously to represent posterior chest wall calcification between the ribs and scapula. IMPRESSION: Stable cardiac enlargement and chronic pulmonary interstitial edema. Probable trace left pleural fluid. Electronically Signed   By: GAletta EdouardM.D.   On: 12/15/2022 15:02    Cardiac Studies   Echo 12/2021 1. Left ventricular ejection fraction, by estimation, is 30 to 35%. The  left ventricle has moderately decreased function. The left ventricle  demonstrates global  hypokinesis. There is mild left ventricular  hypertrophy. Left ventricular diastolic  parameters are consistent with Grade III diastolic dysfunction  (restrictive). Elevated left  atrial pressure.   2. Right ventricular systolic function is moderately reduced. The right  ventricular size is mildly enlarged. Mildly increased right ventricular  wall thickness. Tricuspid regurgitation signal is inadequate for assessing  PA pressure.   3. Left atrial size was mildly dilated.   4. The mitral valve is degenerative. Mild mitral valve regurgitation. No  evidence of mitral stenosis.   5. The aortic valve is tricuspid. There is moderate calcification of the  aortic valve. There is mild thickening of the aortic valve. Aortic valve  regurgitation is not visualized. Aortic valve sclerosis/calcification is  present, without any evidence of  aortic stenosis.    Cardiac cath 08/2021 RIGHT/LEFT HEART CATH AND CORONARY ANGIOGRAPHY    Conclusion   1.  Severe underlying three-vessel coronary artery disease with patent grafts including LIMA to LAD, SVG to large diagonal, sequential SVG to OM /distal left circumflex and SVG to right PDA. 2.  Right heart catheterization showed normal right and left-sided filling pressures, minimal pulmonary hypertension and normal cardiac output.   Recommendations: Continue medical therapy for coronary artery disease and congestive heart failure. Volume status appears optimal at this time.     Echo 08/2021  1. Left ventricular ejection fraction, by estimation, is 35 to 40%. The  left ventricle has moderately decreased function. The left ventricle  demonstrates global hypokinesis. Concern for anterior, anteroseptal and  apical hypokinesis. The left ventricular   internal cavity size was mildly dilated. Left ventricular diastolic  parameters are indeterminate.   2. Right ventricular systolic function is grossly moderately reduced  though difficult to visualize. The right  ventricular size is mildly  enlarged.   3. Left atrial size was mildly dilated.   4. The mitral valve is normal in structure. No evidence of mitral valve  regurgitation. No evidence of mitral stenosis.   5. The aortic valve is normal in structure. Aortic valve regurgitation is  not visualized. No aortic stenosis is present.   6. The inferior vena cava is dilated in size with <50% respiratory  variability, suggesting right atrial pressure of 15 mmHg.   7. Bradycardia noted   8. Challenging images   Patient Profile     88 y.o. 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  - No further chest pain reported - not a good candidate for cath given CKD. May consider Myoview Lexiscan.    Acute on chronic systolic heart failure ICM LVEF 30-35% - Echo 12/2021 showed LVEF 30-35%, G3DD - repeat echo showed LVEF 30-35%, moderate LVH - BNP >3000 - IV lasix 60m BID - Net -3.5L - trend kidney function - monitor strict I/Os and daily weights - he may need to take Torsemide daily at d/c. PTA he was taking PRN - continue Coreg, Hydralazine, Imdur and Entresto   VF arrest/trifascicular block  s/p CRT-P device December 2022 - he is doing remote monitoring followed by EP   HTN - BP reaosnable - continue Coreg, Hydralazine,  Imdur and Entresto - IV lasix   HLD - LDL 60 - continue statin therapy  For questions or updates, please contact [facility] Please consult [email or web address] for contact info under        Signed, Hiawatha Merriott Ninfa Meeker, PA-C  12/17/2022, 7:37 AM

## 2022-12-17 NOTE — Assessment & Plan Note (Signed)
Watch closely with diuresis.  Creatinine 1.9 upon discharge.  Recommend checking a BMP and follow-up appointment

## 2022-12-17 NOTE — Progress Notes (Signed)
Progress Note   Patient: [name] [id number] DOB: 08/15/1935 DOA: 12/15/2022     0 DOS: the patient was seen and examined on 12/17/2022   Brief hospital course: 88 y.o. 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  Symptoms worsened in the past 1 day thus prompting the visit to the ED.  His chest pain is described as a tightness and it is not radiating.  He has wheezing and lower extremity edema and has been using his torsemide as needed and more over the past several days.  Also has a cough without sputum production and denies fever or chills.  Denies leg pains.. ED course and data review: BP 163/105 on arrival with otherwise normal vitals.  Troponin 23 and BNP pending.  CBC unremarkable as well as BMP.  Respiratory viral panel negative for COVID flu and influenza.  EKG, personally viewed and interpreted shows paced rhythm at 66 without ischemic changes CTA chest for the most part unremarkable, negative for PE and showing small bilateral pleural effusions. Patient treated with Lasix and Nitropaste and hospitalist consulted for admission for CHF exacerbation and high risk chest pain  2/9.  Patient feeling better with regards to his breathing.  Still has some lower extremity swelling.  Continue Lasix IV.  Assessment and Plan: * Acute on chronic systolic CHF (congestive heart failure) (HCC) Continue Lasix 40 mg IV twice daily, continue Entresto and [place] and Coreg.  Daily weights.  HTN (hypertension) Continue Entresto, Coreg  Chronic kidney disease, stage 3b ([place]) Watch closely with diuresis.  Creatinine 1.67.  Type 2 diabetes mellitus with hyperlipidemia (HCC) Continue Crestor.  On Levemir 15 units nightly and sliding scale  insulin.  CAD, multiple vessel Continue to monitor.  Troponins borderline at 18 and 23.  Hypokalemia Replace potassium while on IV Lasix  Obesity (BMI 30-39.9) BMI 34.17  Thrombocytopenia (HCC) Continue to monitor        Subjective: Patient feeling better with regards to his breathing.  Still has some swelling of his lower extremities.  Admitted with CHF.  Physical Exam: Vitals:   12/17/22 0413 12/17/22 0833 12/17/22 1147 12/17/22 1338  BP: 136/68 (!) 148/93 (!) 145/87   Pulse: (!) 59 66 68   Resp: 20 16 16   $ Temp: 98.6 F (37 C) 98.4 F (36.9 C) 98 F (36.7 C)   TempSrc: Oral Oral Oral   SpO2: 99% 95% 100%   Weight:    101.9 kg  Height:       Physical Exam HENT:     Head: Normocephalic.     Mouth/Throat:     Pharynx: No oropharyngeal exudate.  Eyes:     General: Lids are normal.     Conjunctiva/sclera: Conjunctivae normal.  Cardiovascular:     Rate and Rhythm: Normal rate and regular rhythm.     Heart sounds: Normal heart sounds, S1 normal and S2 normal.  Pulmonary:     Breath sounds: No decreased breath sounds, wheezing, rhonchi or rales.  Abdominal:     Palpations: Abdomen is soft.     Tenderness: There is no abdominal tenderness.  Musculoskeletal:     Right lower leg: No swelling.     Left lower leg: No swelling.  Skin:  General: Skin is warm.     Findings: No rash.  Neurological:     Mental Status: He is alert and oriented to person, place, and time.     Data Reviewed: Creatinine 1.67, potassium 3.2, BNP 3584, platelet count 143  Family Communication: Left message for wife  Disposition: Status is: Inpatient Remains inpatient appropriate because: Continue IV diuresis with Lasix  Planned Discharge Destination: Home    Time spent: 28 minutes  Author: Loletha Grayer, MD 12/17/2022 1:58 PM  For on call review [email or web address].

## 2022-12-17 NOTE — Consult Note (Signed)
   Heart Failure Nurse Navigator Note  HFrEF 35%.  Severe global hypokinesis of the anterior wall.  Left ventricular internal cavity size is mildly dilated.  Moderate asymmetric left ventricular hypertrophy of the septum dull segment.  Left atria is mildly dilated.  He presented to the emergency room with complaints of tightness in his chest, wheezing and lower extremity edema.  BNP 3584  Comorbidities:  Coronary artery disease status post CABG x 5 Hypertension Chronic kidney disease stage III Diabetes Hyperlipidemia    Insertion of CRT-P  Medications:  Carvedilol 6.25 mg 2 times a day with meals Plavix 75 mg daily Farxiga 10 mg daily Furosemide 40 mg IV every 12 Imdur mononitrate 30 mg daily Potassium chloride 40 mEq 2 times a day Crestor 40 mg daily Entresto 24/26 mg 2 times a day Hydralazine 25 mg 2 times a day  Labs:  Sodium 140, potassium 3.2, chloride 107, CO2 25, BUN 26, creatinine 1.67, GFR 40 Weight not documented Intake not documented Output 2500 mL Blood pressure 145/87  Initial meeting with patient on this admission.  There were no family members present at the bedside.  He was reading heart failure education booklet when I entered the room.  Patient states at home that he had been weighing himself daily and had been maintaining 221 pounds until just prior to admission it went up to 223.  He started taking his furosemide but felt that it was not working.  Discussed fluid restriction, he states that the doctors are now told him that he should not drink any more than 32 ounces of bottled water daily.  He does not add salt at the table felt that he had been compliant with his low-sodium diet.  He talked about how he did not realize that his heart function was not normal that it had weakened.  He also states that the doctors now want him to take his torsemide on Monday Wednesday and Friday rather than as needed.  We talked about him notifying Otila Kluver in the  outpatient heart failure clinic when he sees a weight gain of 2 pounds overnight or total of 5 pounds within the week or changes in his symptoms such as increasing shortness of breath and lower extremity edema.  Given the heart failure clinic magnet with her phone number.  Pricilla Riffle RN CHFN

## 2022-12-18 DIAGNOSIS — N1832 Chronic kidney disease, stage 3b: Secondary | ICD-10-CM | POA: Diagnosis not present

## 2022-12-18 DIAGNOSIS — I2510 Atherosclerotic heart disease of native coronary artery without angina pectoris: Secondary | ICD-10-CM | POA: Diagnosis not present

## 2022-12-18 DIAGNOSIS — I110 Hypertensive heart disease with heart failure: Secondary | ICD-10-CM

## 2022-12-18 DIAGNOSIS — I5023 Acute on chronic systolic (congestive) heart failure: Secondary | ICD-10-CM | POA: Diagnosis not present

## 2022-12-18 DIAGNOSIS — I10 Essential (primary) hypertension: Secondary | ICD-10-CM | POA: Diagnosis not present

## 2022-12-18 LAB — BASIC METABOLIC PANEL
Anion gap: 9 (ref 5–15)
BUN: 24 mg/dL — ABNORMAL HIGH (ref 8–23)
CO2: 26 mmol/L (ref 22–32)
Calcium: 8.1 mg/dL — ABNORMAL LOW (ref 8.9–10.3)
Chloride: 105 mmol/L (ref 98–111)
Creatinine, Ser: 1.64 mg/dL — ABNORMAL HIGH (ref 0.61–1.24)
GFR, Estimated: 41 mL/min — ABNORMAL LOW (ref >60)
Glucose, Bld: 105 mg/dL — ABNORMAL HIGH (ref 70–99)
Potassium: 3.7 mmol/L (ref 3.5–5.1)
Sodium: 140 mmol/L (ref 135–145)

## 2022-12-18 LAB — GLUCOSE, CAPILLARY
Glucose-Capillary: 96 mg/dL (ref 70–99)
Glucose-Capillary: 157 mg/dL — ABNORMAL HIGH (ref 70–99)
Glucose-Capillary: 112 mg/dL — ABNORMAL HIGH (ref 70–99)
Glucose-Capillary: 164 mg/dL — ABNORMAL HIGH (ref 70–99)

## 2022-12-18 MED ORDER — FUROSEMIDE 10 MG/ML IJ SOLN
60.0000 mg | Freq: Two times a day (BID) | INTRAMUSCULAR | Status: DC
  Administered 2022-12-18 – 2022-12-19 (×2): 60 mg via INTRAVENOUS
  Filled 2022-12-18 (×2): qty 6

## 2022-12-18 MED ORDER — SACUBITRIL-VALSARTAN 49-51 MG PO TABS
1.0000 | ORAL_TABLET | Freq: Two times a day (BID) | ORAL | Status: DC
  Administered 2022-12-18 – 2022-12-19 (×2): 1 via ORAL
  Filled 2022-12-18 (×2): qty 1

## 2022-12-18 MED ORDER — ISOSORBIDE MONONITRATE ER 30 MG PO TB24
30.0000 mg | ORAL_TABLET | Freq: Two times a day (BID) | ORAL | Status: DC
  Administered 2022-12-18 – 2022-12-19 (×2): 30 mg via ORAL
  Filled 2022-12-18 (×2): qty 1

## 2022-12-18 NOTE — Progress Notes (Signed)
Progress Note   Patient: [name] [id number] DOB: [birth year] DOA: 12/15/2022     1 DOS: the patient was seen and examined on 12/18/2022   Brief hospital course: 88 y.o. 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  Symptoms worsened in the past 1 day thus prompting the visit to the ED.  His chest pain is described as a tightness and it is not radiating.  He has wheezing and lower extremity edema and has been using his torsemide as needed and more over the past several days.  Also has a cough without sputum production and denies fever or chills.  Denies leg pains.. ED course and data review: BP 163/105 on arrival with otherwise normal vitals.  Troponin 23 and BNP pending.  CBC unremarkable as well as BMP.  Respiratory viral panel negative for COVID flu and influenza.  EKG, personally viewed and interpreted shows paced rhythm at 66 without ischemic changes CTA chest for the most part unremarkable, negative for PE and showing small bilateral pleural effusions. Patient treated with Lasix and Nitropaste and hospitalist consulted for admission for CHF exacerbation and high risk chest pain  2/9.  Patient feeling better with regards to his breathing.  Still has some lower extremity swelling.  Continue Lasix IV. 2/10.  Patient's weight up to 103 kg, was 101.9 yesterday and on admission 100.2.  Increase IV Lasix to 60 mg twice daily and fluid restrict.  Assessment and Plan: * Acute on chronic systolic CHF (congestive heart failure) (HCC) Increase Lasix 60 mg IV twice daily, continue Entresto and [place] and Coreg.  Daily weights.  Since weights have increased on a daily basis and up to 103 kg will increase Lasix today and recheck BMP  tomorrow.  HTN (hypertension) Continue Entresto, Coreg  Chronic kidney disease, stage 3b ([place]) Watch closely with diuresis.  Creatinine 1.64.  Type 2 diabetes mellitus with hyperlipidemia (HCC) Continue Crestor.  On Levemir 15 units nightly and sliding scale insulin.  CAD, multiple vessel Continue to monitor.  Troponins borderline at 18 and 23.  Hypokalemia Replace potassium while on IV Lasix  Obesity (BMI 30-39.9) BMI 34.53  Thrombocytopenia (HCC) Continue to monitor        Subjective: Patient feels well with regards to his breathing and better than when he came in.  Still has some swelling of his lower extremities.  Has had weight gain while here up to 103 kg.  Physical Exam: Vitals:   12/18/22 0440 12/18/22 0500 12/18/22 0741 12/18/22 1209  BP: (!) 147/83  (!) 141/91 125/62  Pulse: 60  62 65  Resp: 18  16 18  $ Temp: 98 F (36.7 C)  98.3 F (36.8 C) 97.7 F (36.5 C)  TempSrc:   Oral Oral  SpO2: 97%  100% 98%  Weight:  103 kg    Height:       Physical Exam HENT:     Head: Normocephalic.     Mouth/Throat:     Pharynx: No oropharyngeal exudate.  Eyes:     General: Lids are normal.     Conjunctiva/sclera: Conjunctivae normal.  Cardiovascular:     Rate and Rhythm: Normal rate and regular rhythm.     Heart sounds: Normal heart sounds, S1 normal and S2  normal.  Pulmonary:     Breath sounds: No decreased breath sounds, wheezing, rhonchi or rales.  Abdominal:     Palpations: Abdomen is soft.     Tenderness: There is no abdominal tenderness.  Musculoskeletal:     Right lower leg: Swelling present.     Left lower leg: Swelling present.  Skin:    General: Skin is warm.     Findings: No rash.  Neurological:     Mental Status: He is alert and oriented to person, place, and time.     Data Reviewed: Weight 103 kg, creatinine 1.64, potassium 3.7  Family Communication: Updated patient's wife on the phone  Disposition: Status is: Inpatient Remains inpatient  appropriate because: Patient's weight increased up to 103 kg today.  Discussed fluid restriction.  Planned Discharge Destination: Home    Time spent: 28 minutes  Author: Loletha Grayer, MD 12/18/2022 12:17 PM  For on call review [email or web address].

## 2022-12-18 NOTE — Progress Notes (Signed)
Rounding Note    Patient Name: [name] Date of Encounter: 12/18/2022  [place] Cardiologist: [name], MD   Subjective   UOP -3.8L. kidney function stable. Patient is overall feeling better. No chest pain reported.   Inpatient Medications    Scheduled Meds:  carvedilol  6.25 mg Oral BID WC   clopidogrel  75 mg Oral Daily   dapagliflozin propanediol  10 mg Oral Daily   enoxaparin (LOVENOX) injection  0.5 mg/kg Subcutaneous Q24H   furosemide  40 mg Intravenous Q12H   hydrALAZINE  25 mg Oral q12n4p   insulin aspart  0-15 Units Subcutaneous TID WC   insulin aspart  0-5 Units Subcutaneous QHS   insulin detemir  15 Units Subcutaneous QHS   isosorbide mononitrate  30 mg Oral Daily   magnesium oxide  400 mg Oral Daily   pantoprazole  40 mg Oral BID   potassium chloride SA  40 mEq Oral BID   rosuvastatin  40 mg Oral Daily   sacubitril-valsartan  1 tablet Oral BID   Continuous Infusions:  PRN Meds: acetaminophen **OR** acetaminophen, albuterol, ondansetron **OR** ondansetron (ZOFRAN) IV   Vital Signs    Vitals:   12/17/22 2336 12/18/22 0440 12/18/22 0500 12/18/22 0741  BP: (!) 140/86 (!) 147/83  (!) 141/91  Pulse: 60 60  62  Resp: 18 18  16  $ Temp: 98 F (36.7 C) 98 F (36.7 C)  98.3 F (36.8 C)  TempSrc:    Oral  SpO2: 98% 97%  100%  Weight:   103 kg   Height:        Intake/Output Summary (Last 24 hours) at 12/18/2022 0850 Last data filed at 12/18/2022 0800 Gross per 24 hour  Intake 60 ml  Output 3575 ml  Net -3515 ml      12/18/2022    5:00 AM 12/17/2022    1:38 PM 12/15/2022   10:42 PM  Last 3 Weights  Weight (lbs) 227 lb 1.2 oz 224 lb 11.2 oz 221 lb  Weight (kg) 103 kg 101.923 kg 100.245 kg      Telemetry    AV pacing HR 60s - Personally Reviewed  ECG    No new - Personally Reviewed  Physical Exam   GEN: No acute distress.   Neck: No JVD Cardiac: RRR, no murmurs, rubs, or gallops.  Respiratory: Clear to auscultation  bilaterally. GI: Soft, nontender, non-distended  MS: 1+ lower leg edema; No deformity. Neuro:  Nonfocal  Psych: Normal affect   Labs    High Sensitivity Troponin:   Recent Labs  Lab 12/15/22 1407 12/15/22 1820  TROPONINIHS 18* 23*     Chemistry Recent Labs  Lab 12/16/22 0452 12/17/22 0815 12/18/22 0248  NA 142 140 140  K 3.5 3.2* 3.7  CL 108 107 105  CO2 27 25 26  $ GLUCOSE 142* 125* 105*  BUN 27* 26* 24*  CREATININE 1.55* 1.67* 1.64*  CALCIUM 8.1* 7.9* 8.1*  GFRNONAA 44* 40* 41*  ANIONGAP 7 8 9    $ Lipids No results for input(s): "CHOL", "TRIG", "HDL", "LABVLDL", "LDLCALC", "CHOLHDL" in the last 168 hours.  Hematology Recent Labs  Lab 12/15/22 1407 12/16/22 0452  WBC 5.9 4.8  RBC 5.64 5.37  HGB 14.8 14.1  HCT 49.1 46.3  MCV 87.1 86.2  MCH 26.2 26.3  MCHC 30.1 30.5  RDW 17.1* 17.2*  PLT 164 143*   Thyroid No results for input(s): "TSH", "FREET4" in the last 168 hours.  BNP Recent  Labs  Lab 12/16/22 0452  BNP 3,584.8*    DDimer No results for input(s): "DDIMER" in the last 168 hours.   Radiology    No results found.  Cardiac Studies   Echo 12/2021 1. Left ventricular ejection fraction, by estimation, is 30 to 35%. The  left ventricle has moderately decreased function. The left ventricle  demonstrates global hypokinesis. There is mild left ventricular  hypertrophy. Left ventricular diastolic  parameters are consistent with Grade III diastolic dysfunction  (restrictive). Elevated left atrial pressure.   2. Right ventricular systolic function is moderately reduced. The right  ventricular size is mildly enlarged. Mildly increased right ventricular  wall thickness. Tricuspid regurgitation signal is inadequate for assessing  PA pressure.   3. Left atrial size was mildly dilated.   4. The mitral valve is degenerative. Mild mitral valve regurgitation. No  evidence of mitral stenosis.   5. The aortic valve is tricuspid. There is moderate calcification of  the  aortic valve. There is mild thickening of the aortic valve. Aortic valve  regurgitation is not visualized. Aortic valve sclerosis/calcification is  present, without any evidence of  aortic stenosis.    Cardiac cath 08/2021 RIGHT/LEFT HEART CATH AND CORONARY ANGIOGRAPHY    Conclusion   1.  Severe underlying three-vessel coronary artery disease with patent grafts including LIMA to LAD, SVG to large diagonal, sequential SVG to OM /distal left circumflex and SVG to right PDA. 2.  Right heart catheterization showed normal right and left-sided filling pressures, minimal pulmonary hypertension and normal cardiac output.   Recommendations: Continue medical therapy for coronary artery disease and congestive heart failure. Volume status appears optimal at this time.     Echo 08/2021  1. Left ventricular ejection fraction, by estimation, is 35 to 40%. The  left ventricle has moderately decreased function. The left ventricle  demonstrates global hypokinesis. Concern for anterior, anteroseptal and  apical hypokinesis. The left ventricular   internal cavity size was mildly dilated. Left ventricular diastolic  parameters are indeterminate.   2. Right ventricular systolic function is grossly moderately reduced  though difficult to visualize. The right ventricular size is mildly  enlarged.   3. Left atrial size was mildly dilated.   4. The mitral valve is normal in structure. No evidence of mitral valve  regurgitation. No evidence of mitral stenosis.   5. The aortic valve is normal in structure. Aortic valve regurgitation is  not visualized. No aortic stenosis is present.   6. The inferior vena cava is dilated in size with <50% respiratory  variability, suggesting right atrial pressure of 15 mmHg.   7. Bradycardia noted   8. Challenging images   Patient Profile     88 y.o. 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  - No further chest pain reported -  not a good candidate for cath given CKD. May consider Myoview Lexiscan.    Acute on chronic systolic heart failure ICM LVEF 30-35% - Echo 12/2021 showed LVEF 30-35%, G3DD - repeat echo showed LVEF 30-35%, moderate LVH - BNP >3000 - IV lasix 78m BID - Net -8.1L - trend kidney function - monitor strict I/Os and daily weights with diuresis - continue Coreg, Hydralazine, Imdur and Entresto - he may need to take Torsemide daily at d/c. PTA he was taking PRN   VF arrest/trifascicular block s/p CRT-P device December 2022 - he is doing remote monitoring followed by EP   HTN - BP reaosnable - continue Coreg, Hydralazine, Imdur and Entresto - IV lasix   HLD - LDL 60 - continue statin therapy  For questions or updates, please contact [facility] HillPlease consult [email or web address] for contact info under        Signed, Pixie Burgener HNinfa Meeker PA-C  12/18/2022, 8:50 AM

## 2022-12-19 DIAGNOSIS — N1832 Chronic kidney disease, stage 3b: Secondary | ICD-10-CM | POA: Diagnosis not present

## 2022-12-19 DIAGNOSIS — E1169 Type 2 diabetes mellitus with other specified complication: Secondary | ICD-10-CM | POA: Diagnosis not present

## 2022-12-19 DIAGNOSIS — I5023 Acute on chronic systolic (congestive) heart failure: Secondary | ICD-10-CM | POA: Diagnosis not present

## 2022-12-19 DIAGNOSIS — K5909 Other constipation: Secondary | ICD-10-CM

## 2022-12-19 DIAGNOSIS — I10 Essential (primary) hypertension: Secondary | ICD-10-CM | POA: Diagnosis not present

## 2022-12-19 DIAGNOSIS — R079 Chest pain, unspecified: Secondary | ICD-10-CM

## 2022-12-19 DIAGNOSIS — K5900 Constipation, unspecified: Secondary | ICD-10-CM | POA: Insufficient documentation

## 2022-12-19 LAB — BASIC METABOLIC PANEL
Anion gap: 8 (ref 5–15)
BUN: 24 mg/dL — ABNORMAL HIGH (ref 8–23)
CO2: 27 mmol/L (ref 22–32)
Calcium: 8.2 mg/dL — ABNORMAL LOW (ref 8.9–10.3)
Chloride: 103 mmol/L (ref 98–111)
Creatinine, Ser: 1.9 mg/dL — ABNORMAL HIGH (ref 0.61–1.24)
GFR, Estimated: 34 mL/min — ABNORMAL LOW (ref >60)
Glucose, Bld: 99 mg/dL (ref 70–99)
Potassium: 4.3 mmol/L (ref 3.5–5.1)
Sodium: 138 mmol/L (ref 135–145)

## 2022-12-19 LAB — CBC
HCT: 47.2 % (ref 39.0–52.0)
Hemoglobin: 14.6 g/dL (ref 13.0–17.0)
MCH: 26.1 pg (ref 26.0–34.0)
MCHC: 30.9 g/dL (ref 30.0–36.0)
MCV: 84.3 fL (ref 80.0–100.0)
Platelets: 164 10*3/uL (ref 150–400)
RBC: 5.6 MIL/uL (ref 4.22–5.81)
RDW: 16.6 % — ABNORMAL HIGH (ref 11.5–15.5)
WBC: 4.5 10*3/uL (ref 4.0–10.5)
nRBC: 0 % (ref 0.0–0.2)

## 2022-12-19 LAB — GLUCOSE, CAPILLARY
Glucose-Capillary: 89 mg/dL (ref 70–99)
Glucose-Capillary: 229 mg/dL — ABNORMAL HIGH (ref 70–99)

## 2022-12-19 MED ORDER — POLYETHYLENE GLYCOL 3350 17 G PO PACK
17.0000 g | PACK | Freq: Every day | ORAL | Status: DC
  Administered 2022-12-19: 17 g via ORAL
  Filled 2022-12-19: qty 1

## 2022-12-19 MED ORDER — SACUBITRIL-VALSARTAN 49-51 MG PO TABS: 1.0000 | ORAL_TABLET | Freq: Two times a day (BID) | ORAL | 0 refills | Status: DC

## 2022-12-19 MED ORDER — ISOSORBIDE MONONITRATE ER 30 MG PO TB24: 30.0000 mg | ORAL_TABLET | Freq: Two times a day (BID) | ORAL | 0 refills | Status: DC

## 2022-12-19 MED ORDER — POLYETHYLENE GLYCOL 3350 17 G PO PACK: 17.0000 g | PACK | Freq: Every day | ORAL | 0 refills | Status: AC | PRN

## 2022-12-19 MED ORDER — FUROSEMIDE 10 MG/ML IJ SOLN: 40.0000 mg | Freq: Two times a day (BID) | INTRAMUSCULAR | Status: DC

## 2022-12-19 MED ORDER — POTASSIUM CHLORIDE CRYS ER 10 MEQ PO TBCR: 10.0000 meq | EXTENDED_RELEASE_TABLET | Freq: Every day | ORAL | 0 refills | Status: DC

## 2022-12-19 MED ORDER — TORSEMIDE 20 MG PO TABS: 20.0000 mg | ORAL_TABLET | Freq: Every day | ORAL | 0 refills | Status: DC

## 2022-12-19 NOTE — Progress Notes (Signed)
No new events overnight. Pt stable and vitals WNL and at baseline. No cardiac events noted overnight. Tele order expired. Pt is stable at this time and this RN did not call provider for new tele orders. PT SR AV paced. HR 64.

## 2022-12-19 NOTE — Assessment & Plan Note (Signed)
Prescribe MiraLAX

## 2022-12-19 NOTE — Progress Notes (Signed)
Patient ambulated around the nurses station twice,  using walker, tolerated well o2 sat 98% room air.

## 2022-12-19 NOTE — TOC Transition Note (Signed)
Transition of Care [facility]) - CM/SW Discharge Note   Patient Details  Name: [name] MRN: [id number] Date of Birth: 1934/12/03  Transition of Care [facility]) CM/SW Contact:  Izola Price, RN Phone Number: 12/19/2022, 12:59 PM   Clinical Narrative:  2/11: Patient is discharging to home/self care with follow up instructions/appointments. Current with HF clinic and another ambulatory HF referral. CHF RN consulted this admission as well on 12/17/22 per [facility] consult order. Per TOC initial note, spouse can transport home and patient is current with PCP. Simmie Davies RN CM     Final next level of care: Home/Self Care Barriers to Discharge: Barriers Resolved   Patient Goals and CMS Choice      Discharge Placement                         Discharge Plan and Services Additional resources added to the After Visit Summary for                  DME Arranged: N/A DME Agency: NA       HH Arranged: NA HH Agency: NA        Social Determinants of Health (SDOH) Interventions SDOH Screenings   Food Insecurity: No Food Insecurity (12/16/2022)  Housing: Low Risk  (12/16/2022)  Transportation Needs: No Transportation Needs (12/16/2022)  Utilities: Not At Risk (12/16/2022)  Depression (PHQ2-9): Low Risk  (04/07/2022)  Financial Resource Strain: Low Risk  (07/27/2019)  Physical Activity: Inactive (07/27/2019)  Social Connections: Unknown (07/27/2019)  Stress: No Stress Concern Present (07/27/2019)  Tobacco Use: Medium Risk (12/15/2022)     Readmission Risk Interventions    03/29/2022    1:18 PM 01/05/2022    2:16 PM 10/29/2021   10:22 AM  Readmission Risk Prevention Plan  Transportation Screening Complete Complete Complete  PCP or Specialist Appt within 3-5 Days Complete Complete Complete  HRI or [place]  Complete Complete  Social Work Consult for [place] Planning/Counseling  Complete Complete  Palliative Care Screening Not Applicable Not Applicable Not Applicable   Medication Review Press photographer) Complete Complete Complete

## 2022-12-19 NOTE — Progress Notes (Signed)
Rounding Note    Patient Name: [name] Date of Encounter: 12/19/2022  [place] Cardiologist: [name], MD   Subjective   UOP -3.4L. Scr is up today. Patient is overall feeling better. No chest pain reported.   Inpatient Medications    Scheduled Meds:  carvedilol  6.25 mg Oral BID WC   clopidogrel  75 mg Oral Daily   dapagliflozin propanediol  10 mg Oral Daily   enoxaparin (LOVENOX) injection  0.5 mg/kg Subcutaneous Q24H   furosemide  40 mg Intravenous Q12H   hydrALAZINE  25 mg Oral q12n4p   insulin aspart  0-15 Units Subcutaneous TID WC   insulin aspart  0-5 Units Subcutaneous QHS   insulin detemir  15 Units Subcutaneous QHS   isosorbide mononitrate  30 mg Oral BID   magnesium oxide  400 mg Oral Daily   pantoprazole  40 mg Oral BID   rosuvastatin  40 mg Oral Daily   sacubitril-valsartan  1 tablet Oral BID   Continuous Infusions:  PRN Meds: acetaminophen **OR** acetaminophen, albuterol, ondansetron **OR** ondansetron (ZOFRAN) IV   Vital Signs    Vitals:   12/19/22 0003 12/19/22 0552 12/19/22 0802 12/19/22 0820  BP: 133/73 125/65 120/88   Pulse: 69 (!) 59 60   Resp: 19 17 20   $ Temp: 98.7 F (37.1 C) 98.6 F (37 C) 97.6 F (36.4 C)   TempSrc:   Oral   SpO2: 99% 93% 100%   Weight:    95.8 kg  Height:        Intake/Output Summary (Last 24 hours) at 12/19/2022 0827 Last data filed at 12/19/2022 0500 Gross per 24 hour  Intake 600 ml  Output 3250 ml  Net -2650 ml      12/19/2022    8:20 AM 12/18/2022    5:00 AM 12/17/2022    1:38 PM  Last 3 Weights  Weight (lbs) 211 lb 3.2 oz 227 lb 1.2 oz 224 lb 11.2 oz  Weight (kg) 95.8 kg 103 kg 101.923 kg      Telemetry    AV paced rhythm HR 60s - Personally Reviewed  ECG    No new - Personally Reviewed  Physical Exam   GEN: No acute distress.   Neck: No JVD Cardiac: RRR, no murmurs, rubs, or gallops.  Respiratory: Clear to auscultation bilaterally. GI: Soft, nontender, non-distended   MS: No edema; No deformity. Neuro:  Nonfocal  Psych: Normal affect   Labs    High Sensitivity Troponin:   Recent Labs  Lab 12/15/22 1407 12/15/22 1820  TROPONINIHS 18* 23*     Chemistry Recent Labs  Lab 12/17/22 0815 12/18/22 0248 12/19/22 0536  NA 140 140 138  K 3.2* 3.7 4.3  CL 107 105 103  CO2 25 26 27  $ GLUCOSE 125* 105* 99  BUN 26* 24* 24*  CREATININE 1.67* 1.64* 1.90*  CALCIUM 7.9* 8.1* 8.2*  GFRNONAA 40* 41* 34*  ANIONGAP 8 9 8    $ Lipids No results for input(s): "CHOL", "TRIG", "HDL", "LABVLDL", "LDLCALC", "CHOLHDL" in the last 168 hours.  Hematology Recent Labs  Lab 12/15/22 1407 12/16/22 0452 12/19/22 0536  WBC 5.9 4.8 4.5  RBC 5.64 5.37 5.60  HGB 14.8 14.1 14.6  HCT 49.1 46.3 47.2  MCV 87.1 86.2 84.3  MCH 26.2 26.3 26.1  MCHC 30.1 30.5 30.9  RDW 17.1* 17.2* 16.6*  PLT 164 143* 164   Thyroid No results for input(s): "TSH", "FREET4" in the last 168 hours.  BNP Recent Labs  Lab 12/16/22 0452  BNP 3,584.8*    DDimer No results for input(s): "DDIMER" in the last 168 hours.   Radiology    No results found.  Cardiac Studies   Echo 12/2021 1. Left ventricular ejection fraction, by estimation, is 30 to 35%. The  left ventricle has moderately decreased function. The left ventricle  demonstrates global hypokinesis. There is mild left ventricular  hypertrophy. Left ventricular diastolic  parameters are consistent with Grade III diastolic dysfunction  (restrictive). Elevated left atrial pressure.   2. Right ventricular systolic function is moderately reduced. The right  ventricular size is mildly enlarged. Mildly increased right ventricular  wall thickness. Tricuspid regurgitation signal is inadequate for assessing  PA pressure.   3. Left atrial size was mildly dilated.   4. The mitral valve is degenerative. Mild mitral valve regurgitation. No  evidence of mitral stenosis.   5. The aortic valve is tricuspid. There is moderate calcification of  the  aortic valve. There is mild thickening of the aortic valve. Aortic valve  regurgitation is not visualized. Aortic valve sclerosis/calcification is  present, without any evidence of  aortic stenosis.    Cardiac cath 08/2021 RIGHT/LEFT HEART CATH AND CORONARY ANGIOGRAPHY    Conclusion   1.  Severe underlying three-vessel coronary artery disease with patent grafts including LIMA to LAD, SVG to large diagonal, sequential SVG to OM /distal left circumflex and SVG to right PDA. 2.  Right heart catheterization showed normal right and left-sided filling pressures, minimal pulmonary hypertension and normal cardiac output.   Recommendations: Continue medical therapy for coronary artery disease and congestive heart failure. Volume status appears optimal at this time.     Echo 08/2021  1. Left ventricular ejection fraction, by estimation, is 35 to 40%. The  left ventricle has moderately decreased function. The left ventricle  demonstrates global hypokinesis. Concern for anterior, anteroseptal and  apical hypokinesis. The left ventricular   internal cavity size was mildly dilated. Left ventricular diastolic  parameters are indeterminate.   2. Right ventricular systolic function is grossly moderately reduced  though difficult to visualize. The right ventricular size is mildly  enlarged.   3. Left atrial size was mildly dilated.   4. The mitral valve is normal in structure. No evidence of mitral valve  regurgitation. No evidence of mitral stenosis.   5. The aortic valve is normal in structure. Aortic valve regurgitation is  not visualized. No aortic stenosis is present.   6. The inferior vena cava is dilated in size with <50% respiratory  variability, suggesting right atrial pressure of 15 mmHg.   7. Bradycardia noted   8. Challenging images     Patient Profile     88 y.o. 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  - No  further chest pain reported - not a good candidate for cath given CKD. May consider Myoview Lexiscan.    Acute on chronic systolic heart failure ICM LVEF 30-35% - Echo 12/2021 showed LVEF 30-35%, G3DD - repeat echo showed LVEF 30-35%, moderate LVH - BNP >3000 - IV lasix 31m BID - Net -11.5L - appears relatively euvolemic on exam - Scr up today, can possibly start oral torsemide today - monitor strict I/Os and daily weights with diuresis - continue Coreg, Hydralazine, Imdur and Entresto - he may need to take Torsemide daily at d/c. PTA he was taking PRN   VF arrest/trifascicular block s/p CRT-P device December 2022 - he is doing remote monitoring followed by EP   HTN - BP is good - continue Coreg, Hydralazine, Imdur and Entresto - IV lasix   HLD - LDL 60 - continue statin therapy   For questions or updates, please contact [facility]ur[facility] CenterPlease consult [email or web address] for contact info under        Signed, Jaisha Villacres HNinfa Meeker PA-C  12/19/2022, 8:27 AM

## 2022-12-19 NOTE — Discharge Instructions (Signed)
Last weight 98.5 kg/ 210.76 lbs  Weigh yourself daily, if you gain 3lbs in one day or 5 lbs in a week call the heart failure clinic

## 2022-12-19 NOTE — Discharge Summary (Signed)
Physician Discharge Summary   Patient: [name] MRN: [id number] DOB: [birth year]  Admit date:     12/15/2022  Discharge date: 12/19/22  Discharge Physician: Loletha Grayer   PCP: Theora Gianotti, NP   Recommendations at discharge:   Follow-up with your medical doctor Follow-up CHF clinic Follow-up Dr. Fletcher Anon  Discharge Diagnoses: Principal Problem:   Acute on chronic systolic CHF (congestive heart failure) (HCC) Active Problems:   HTN (hypertension)   Chronic kidney disease, stage 3b (HCC)   CAD, multiple vessel   Type 2 diabetes mellitus with hyperlipidemia (HCC)   Thrombocytopenia (HCC)   Obesity (BMI 30-39.9)   Hypokalemia   Ischemic cardiomyopathy   Hypertensive heart disease with heart failure [facility])   Constipation    Hospital Course: 88 y.o. 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  Symptoms worsened in the past 1 day thus prompting the visit to the ED.  His chest pain is described as a tightness and it is not radiating.  He has wheezing and lower extremity edema and has been using his torsemide as needed and more over the past several days.  Also has a cough without sputum production and denies fever or chills.  Denies leg pains.. ED course and data review: BP 163/105 on arrival with otherwise normal vitals.  Troponin 23 and BNP pending.  CBC unremarkable as well as BMP.  Respiratory viral panel negative for COVID flu and influenza.  EKG, personally viewed and interpreted shows paced rhythm at 66 without ischemic changes CTA chest for the most part unremarkable, negative for PE and showing small bilateral pleural effusions. Patient treated with Lasix and Nitropaste and hospitalist consulted for  admission for CHF exacerbation and high risk chest pain  2/9.  Patient feeling better with regards to his breathing.  Still has some lower extremity swelling.  Continue Lasix IV. 2/10.  Patient's weight up to 103 kg, was 101.9 yesterday and on admission 100.2.  Increase IV Lasix to 60 mg twice daily and fluid restrict. 2/11.  Patient's weight down to 95.8 kg / 210.76 pounds.  Patient's creatinine 1.9.  Patient feeling better and discharged home in stable condition.  Cardiology recommended torsemide 20 mg daily.  Assessment and Plan: * Acute on chronic systolic CHF (congestive heart failure) (HCC) continue Entresto (increased dose) and [place] and Coreg.  Switch to torsemide 20 mg on a daily basis upon going home.  Patient was diuresed with Lasix 40 mg IV twice daily while here in the last day was up to 60 mg twice daily.  Daily weights.  Weight decreased down to 95.8 kg.  Patient advised to weigh himself on a daily basis and call CHF clinic or cardiologist with again in weight of 3 pounds in a day or 5 pounds in a week.  HTN (hypertension) Continue Entresto (increased dose), Coreg, Imdur (increased dose)  Chronic kidney disease, stage 3b ([place]) Watch closely with diuresis.  Creatinine 1.9 upon discharge.  Recommend checking a BMP and follow-up appointment  Type 2 diabetes mellitus with hyperlipidemia ([place]) Continue Crestor.  On Levemir 15 units nightly and sliding scale insulin.  CAD, multiple vessel Continue to monitor.  Troponins borderline at 18 and 23.  Constipation Prescribe MiraLAX  Hypokalemia Potassium replacement  Obesity (BMI 30-39.9) BMI 32.11  Thrombocytopenia (HCC) Continue to monitor         Consultants: Cardiology Procedures performed: None Disposition: Home Diet recommendation:  Cardiac and Carb modified diet DISCHARGE MEDICATION: Allergies as of 12/19/2022       Reactions   Angiotensin Receptor Blockers    hyperkalemia   Metformin Diarrhea    Spironolactone    Hyperkalemia        Medication List     STOP taking these medications    acyclovir 800 MG tablet Commonly known as: ZOVIRAX   Entresto 24-26 MG Generic drug: sacubitril-valsartan Replaced by: sacubitril-valsartan 49-51 MG   furosemide 80 MG tablet Commonly known as: LASIX   losartan 25 MG tablet Commonly known as: COZAAR   Potassium Chloride ER 20 MEQ Tbcr       TAKE these medications    acetaminophen 500 MG tablet Commonly known as: TYLENOL Take 1,000 mg by mouth every 6 (six) hours as needed for mild pain, fever or headache.   albuterol 108 (90 Base) MCG/ACT inhaler Commonly known as: VENTOLIN HFA Inhale 2 puffs into the lungs every 6 (six) hours as needed for wheezing or shortness of breath.   carvedilol 6.25 MG tablet Commonly known as: COREG Take 1 tablet (6.25 mg total) by mouth 2 (two) times daily with a meal.   clopidogrel 75 MG tablet Commonly known as: PLAVIX TAKE 1 TABLET BY MOUTH EVERY DAY   Farxiga 10 MG Tabs tablet Generic drug: dapagliflozin propanediol Take 10 mg by mouth daily.   fluticasone 110 MCG/ACT inhaler Commonly known as: FLOVENT HFA Inhale 1 puff into the lungs daily.   hydrALAZINE 25 MG tablet Commonly known as: APRESOLINE Take 25 mg by mouth 2 times daily at 12 noon and 4 pm.   insulin detemir 100 UNIT/ML injection Commonly known as: LEVEMIR Inject 0.15 mLs (15 Units total) into the skin at bedtime.   isosorbide mononitrate 30 MG 24 hr tablet Commonly known as: IMDUR Take 1 tablet (30 mg total) by mouth 2 (two) times daily. What changed: when to take this   magnesium oxide 400 MG tablet Commonly known as: MAG-OX Take 1 tablet (400 mg total) by mouth daily.   pantoprazole 40 MG tablet Commonly known as: PROTONIX Take 40 mg by mouth 2 (two) times daily.   polyethylene glycol 17 g packet Commonly known as: MIRALAX / GLYCOLAX Take 17 g by mouth daily as needed for moderate constipation.    potassium chloride 10 MEQ tablet Commonly known as: KLOR-CON M Take 1 tablet (10 mEq total) by mouth daily.   rosuvastatin 40 MG tablet Commonly known as: CRESTOR TAKE 1 TABLET BY MOUTH EVERY DAY   sacubitril-valsartan 49-51 MG Commonly known as: ENTRESTO Take 1 tablet by mouth 2 (two) times daily. Replaces: Entresto 24-26 MG   torsemide 20 MG tablet Commonly known as: DEMADEX Take 1 tablet (20 mg total) by mouth daily. What changed:  how much to take when to take this reasons to take this        Follow-up Information     your medical doctor Follow up in 5 day(s).          [name], MD Follow up in 1 week(s).   Specialty: Cardiology Contact information: [place]  [id number] [phone]                Discharge Exam: Danley Danker Weights   12/18/22 0500 12/19/22 0802 12/19/22 0820  Weight: 103 kg 96.7 kg 95.8 kg   Physical Exam HENT:     Head: Normocephalic.     Mouth/Throat:     Pharynx: No oropharyngeal exudate.  Eyes:     General: Lids are normal.     Conjunctiva/sclera: Conjunctivae normal.  Cardiovascular:     Rate and Rhythm: Normal rate and regular rhythm.     Heart sounds: Normal heart sounds, S1 normal and S2 normal.  Pulmonary:     Breath sounds: No decreased breath sounds, wheezing, rhonchi or rales.  Abdominal:     Palpations: Abdomen is soft.     Tenderness: There is no abdominal tenderness.  Musculoskeletal:     Right lower leg: Swelling present.     Left lower leg: Swelling present.  Skin:    General: Skin is warm.     Findings: No rash.  Neurological:     Mental Status: He is alert and oriented to person, place, and time.      Condition at discharge: stable  The results of significant diagnostics from this hospitalization (including imaging, microbiology, ancillary and laboratory) are listed below for reference.   Imaging Studies: ECHOCARDIOGRAM COMPLETE  Result Date: 12/16/2022     ECHOCARDIOGRAM REPORT   Patient Name:   MELAKAI CORELLA Date of Exam: 12/16/2022 Medical Rec #:  [id number]    Height:       68.0 in Accession #:    [id number]   Weight:       221.0 lb Date of Birth:  06-12-1935   BSA:          2.132 m Patient Age:    25 years     BP:           156/71 mmHg Patient Gender: M            HR:           63 bpm. Exam Location:  [facility] Procedure: 2D Echo, Cardiac Doppler and Color Doppler Indications:     CHF-acute systolic [id number]  History:         Patient has prior history of Echocardiogram examinations, most                  recent 01/05/2022. Ischemic cardiomyopathy; Risk                  Factors:Hypertension and Diabetes.  Sonographer:     Sherrie Sport Referring Phys:  [id number] Athena Masse Diagnosing Phys: Ida Rogue MD  Sonographer Comments: Technically challenging study due to limited acoustic windows, no apical window and no subcostal window. IMPRESSIONS  1. Left ventricular ejection fraction, by estimation, is 30 to 35%. The left ventricle has moderately decreased function. The left ventricle demonstrates global hypokinesis with severe hypokinesis of the anterior wall. The left ventricular internal cavity size was mildly dilated. There is moderate asymmetric left ventricular hypertrophy of the septal segment.  2. Right ventricle is not well visualized. Right ventricular systolic function is grossly normal. The right ventricular size is normal. Tricuspid regurgitation signal is inadequate for assessing PA pressure.  3. Left atrial size was mildly dilated.  4. The mitral valve is normal in structure. Trivial mitral valve regurgitation. No evidence of mitral stenosis.  5. The aortic valve is normal in structure. Aortic valve regurgitation is not visualized. Aortic valve sclerosis is present, with no evidence of aortic valve stenosis.  6. The inferior vena cava is normal in size with greater than 50% respiratory variability, suggesting right atrial  pressure of 3 mmHg. FINDINGS  Left  Ventricle: Left ventricular ejection fraction, by estimation, is 30 to 35%. The left ventricle has moderately decreased function. The left ventricle demonstrates global hypokinesis. The left ventricular internal cavity size was mildly dilated. There is moderate asymmetric left ventricular hypertrophy of the septal segment. Left ventricular diastolic parameters are indeterminate. Right Ventricle: The right ventricular size is normal. No increase in right ventricular wall thickness. Right ventricular systolic function is normal. Tricuspid regurgitation signal is inadequate for assessing PA pressure. Left Atrium: Left atrial size was mildly dilated. Right Atrium: Right atrial size was normal in size. Pericardium: There is no evidence of pericardial effusion. Mitral Valve: The mitral valve is normal in structure. There is mild thickening of the mitral valve leaflet(s). Trivial mitral valve regurgitation. No evidence of mitral valve stenosis. Tricuspid Valve: The tricuspid valve is normal in structure. Tricuspid valve regurgitation is not demonstrated. No evidence of tricuspid stenosis. Aortic Valve: The aortic valve is normal in structure. Aortic valve regurgitation is not visualized. Aortic valve sclerosis is present, with no evidence of aortic valve stenosis. Pulmonic Valve: The pulmonic valve was normal in structure. Pulmonic valve regurgitation is not visualized. No evidence of pulmonic stenosis. Aorta: The aortic root is normal in size and structure. Venous: The inferior vena cava is normal in size with greater than 50% respiratory variability, suggesting right atrial pressure of 3 mmHg. IAS/Shunts: No atrial level shunt detected by color flow Doppler. Additional Comments: A device lead is visualized.  LEFT VENTRICLE PLAX 2D LVIDd:         5.60 cm LVIDs:         5.30 cm LV PW:         1.00 cm LV IVS:        1.70 cm LVOT diam:     2.00 cm LVOT Area:     3.14 cm  LEFT ATRIUM         Index LA diam:    4.20 cm 1.97  cm/m   AORTA Ao Root diam: 2.90 cm  SHUNTS Systemic Diam: 2.00 cm Ida Rogue MD Electronically signed by Ida Rogue MD Signature Date/Time: 12/16/2022/4:40:45 PM    Final    CT Angio Chest PE W and/or Wo Contrast  Result Date: 12/15/2022 CLINICAL DATA:  Chest pain, shortness of breath EXAM: CT ANGIOGRAPHY CHEST WITH CONTRAST TECHNIQUE: Multidetector CT imaging of the chest was performed using the standard protocol during bolus administration of intravenous contrast. Multiplanar CT image reconstructions and MIPs were obtained to evaluate the vascular anatomy. RADIATION DOSE REDUCTION: This exam was performed according to the departmental dose-optimization program which includes automated exposure control, adjustment of the mA and/or kV according to patient size and/or use of iterative reconstruction technique. CONTRAST:  3m OMNIPAQUE IOHEXOL 350 MG/ML SOLN COMPARISON:  08/18/2020 FINDINGS: Cardiovascular: No filling defects in the pulmonary arteries to suggest pulmonary emboli. Cardiomegaly. No evidence of aortic aneurysm. Prior CABG. Aortic atherosclerosis. Mediastinum/Nodes: Mildly prominent mediastinal lymph nodes, similar to prior study, likely reactive. No axillary or hilar adenopathy. Lungs/Pleura: Small bilateral pleural effusions. No confluent opacities. Upper Abdomen: No acute findings Musculoskeletal: Chest wall soft tissues are unremarkable. No acute bony abnormality. 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 Review of the MIP images confirms the above findings. IMPRESSION: No evidence of pulmonary embolus. Cardiomegaly, prior CABG. Small bilateral pleural effusions. Aortic Atherosclerosis (ICD10-I70.0). Electronically Signed   By: KRolm BaptiseM.D.  On: 12/15/2022 19:35   DG Chest 2 View  Result Date: 12/15/2022 CLINICAL DATA:  Chest pain and shortness of breath. EXAM: CHEST - 2 VIEW COMPARISON:  03/28/2022  and prior CTA of the chest on 08/18/2020 FINDINGS: Stable cardiac enlargement, evidence of CABG, appearance of left atrial appendage clip and biventricular pacemaker. Chronic pulmonary interstitial edema present. Probable trace left pleural fluid. No pneumothorax. Stable lobulated calcification adjacent to the right scapula shown previously to represent posterior chest wall calcification between the ribs and scapula. IMPRESSION: Stable cardiac enlargement and chronic pulmonary interstitial edema. Probable trace left pleural fluid. Electronically Signed   By: Aletta Edouard M.D.   On: 12/15/2022 15:02    Microbiology: Results for orders placed or performed during the hospital encounter of 12/15/22  Resp panel by RT-PCR (RSV, Flu A&B, Covid) Anterior Nasal Swab     Status: None   Collection Time: 12/15/22  6:30 PM   Specimen: Anterior Nasal Swab  Result Value Ref Range Status   SARS Coronavirus 2 by RT PCR NEGATIVE NEGATIVE Final    Comment: (NOTE) SARS-CoV-2 target nucleic acids are NOT DETECTED.  The SARS-CoV-2 RNA is generally detectable in upper respiratory specimens during the acute phase of infection. The lowest concentration of SARS-CoV-2 viral copies this assay can detect is 138 copies/mL. A negative result does not preclude SARS-Cov-2 infection and should not be used as the sole basis for treatment or other patient management decisions. A negative result may occur with  improper specimen collection/handling, submission of specimen other than nasopharyngeal swab, presence of viral mutation(s) within the areas targeted by this assay, and inadequate number of viral copies(<138 copies/mL). A negative result must be combined with clinical observations, patient history, and epidemiological information. The expected result is Negative.  Fact Sheet for Patients:  EntrepreneurPulse.com.au  Fact Sheet for Healthcare Providers:   IncredibleEmployment.be  This test is no t yet approved or cleared by the [place] FDA and  has been authorized for detection and/or diagnosis of SARS-CoV-2 by FDA under an Emergency Use Authorization (EUA). This EUA will remain  in effect (meaning this test can be used) for the duration of the COVID-19 declaration under Section 564(b)(1) of the Act, 21 U.S.C.section 360bbb-3(b)(1), unless the authorization is terminated  or revoked sooner.       Influenza A by PCR NEGATIVE NEGATIVE Final   Influenza B by PCR NEGATIVE NEGATIVE Final    Comment: (NOTE) The Xpert Xpress SARS-CoV-2/FLU/RSV plus assay is intended as an aid in the diagnosis of influenza from Nasopharyngeal swab specimens and should not be used as a sole basis for treatment. Nasal washings and aspirates are unacceptable for Xpert Xpress SARS-CoV-2/FLU/RSV testing.  Fact Sheet for Patients: EntrepreneurPulse.com.au  Fact Sheet for Healthcare Providers: IncredibleEmployment.be  This test is not yet approved or cleared by the [place] FDA and has been authorized for detection and/or diagnosis of SARS-CoV-2 by FDA under an Emergency Use Authorization (EUA). This EUA will remain in effect (meaning this test can be used) for the duration of the COVID-19 declaration under Section 564(b)(1) of the Act, 21 U.S.C. section 360bbb-3(b)(1), unless the authorization is terminated or revoked.     Resp Syncytial Virus by PCR NEGATIVE NEGATIVE Final    Comment: (NOTE) Fact Sheet for Patients: EntrepreneurPulse.com.au  Fact Sheet for Healthcare Providers: IncredibleEmployment.be  This test is not yet approved or cleared by the [place] FDA and has been authorized for detection and/or diagnosis of SARS-CoV-2 by FDA under an Emergency Use Authorization (  EUA). This EUA will remain in effect (meaning this test can be used) for  the duration of the COVID-19 declaration under Section 564(b)(1) of the Act, 21 U.S.C. section 360bbb-3(b)(1), unless the authorization is terminated or revoked.  Performed at [facility], [address] [place],  [address]     Labs: CBC: Recent Labs  Lab 12/15/22 1407 12/16/22 0452 12/19/22 0536  WBC 5.9 4.8 4.5  HGB 14.8 14.1 14.6  HCT 49.1 46.3 47.2  MCV 87.1 86.2 84.3  PLT 164 143* [id number]   Basic Metabolic Panel: Recent Labs  Lab 12/15/22 1407 12/16/22 0452 12/17/22 0815 12/18/22 0248 12/19/22 0536  NA 142 142 140 140 138  K 4.2 3.5 3.2* 3.7 4.3  CL 109 108 107 105 103  CO2 20* 27 25 26 27  $ GLUCOSE 161* 142* 125* 105* 99  BUN 29* 27* 26* 24* 24*  CREATININE 1.70* 1.55* 1.67* 1.64* 1.90*  CALCIUM 8.4* 8.1* 7.9* 8.1* 8.2*   Liver Function Tests: No results for input(s): "AST", "ALT", "ALKPHOS", "BILITOT", "PROT", "ALBUMIN" in the last 168 hours. CBG: Recent Labs  Lab 12/18/22 1207 12/18/22 1700 12/18/22 2124 12/19/22 0759 12/19/22 1156  GLUCAP 157* 112* 164* 89 229*    Discharge time spent: greater than 30 minutes.  Signed: Loletha Grayer, MD [facility] 12/19/2022

## 2022-12-20 NOTE — Progress Notes (Unsigned)
Patient ID: [name], male    DOB: 02/12/1935, 88 y.o.   MRN: [id number]  HPI  Mr Mascola is a 88 y/o male with a history of heart block, CAD, DM, hyperlipidemia, HTN, pacemaker implantation, CKD, previous tobacco use and chronic heart failure.   Echo 12/16/22 showed an EF of 30-35% along with moderate LVH, mild LAE and trivial MR. Echo report from 01/05/22 reviewed and showed an EF of 30-35% along with mild LVH. Echo report from 08/27/21 reviewed and showed an EF of 35-40% along with mild LAE.   RHC/LHC done 08/31/21 & showed: 1.  Severe underlying three-vessel coronary artery disease with patent grafts including LIMA to LAD, SVG to large diagonal, sequential SVG to OM /distal left circumflex and SVG to right PDA. 2.  Right heart catheterization showed normal right and left-sided filling pressures, minimal pulmonary hypertension and normal cardiac output.  Admitted 12/15/22 due to chest pain and DOE due to a/c heart failure. CTA negative for PE but showed small bilateral pleural effusions. IV diuresis.   He presents today for a follow-up visit with a chief complaint of minimal SOB upon moderate exertion. Describes this as chronic in nature. Has associated fatigue and pedal edema along with this. Denies any difficulty sleeping, dizziness, abdominal distention, palpitations, chest pain, cough, fatigue or weight gain.   Says that he's currently taking 78m torsemide on M, W & F. Discharge summary says daily but he says that someone told him to only take it 3 days/ week.   Past Medical History:  Diagnosis Date   AV block, Mobitz 1    CAD (coronary artery disease)    a. 08/2019 VF arrest-->sev 3vd on cath-->CABGx5 (LIMA->LAD, VG->OM->LCX, VG->RPDA, VG->Diag); b. 08/2021 Cath: sev native multivessel dzs w/ 5/5 patent grafts. Nl filling pressures->Med rx.   CKD (chronic kidney disease), stage III (HCC)    Diabetes mellitus without complication ([facility]    HFrEF (heart failure with reduced ejection  fraction) ([facility] [place]    a. 08/2020 Echo: EF 40-45%; b. 11/2020 Echo: EF 55%, no rwma, mild LVH, Gr2 DD. nl RV fxn; c. 08/2021 Echo: EF 35-40%, glob HK w/ ant/antsept/apical HK. Reduced RV fxn. Mildly dil LA; d. 10/2021 s/p CRT-P; e. 12/2021 Echo: EF 30-35%, glob HK, GrIII DD. Mod red RV fxn, mild LAE, mild MR, AoV sclerosis.   Hyperlipidemia LDL goal <70    Hypertension    Intermittent Complete heart block ([facility]    a. 08/2021 noted on Zio; b. 10/2021 s/p Abbott Allure RF CRT-P (Ser # [id number].   Ischemic cardiomyopathy    a. 08/2020 Echo: EF 40-45%; b. 11/2020 Echo: EF 55%; c. 08/2021 Echo: EF 35-40%; d. 10/2021 s/p CRT-P; e. 12/2021 Echo: EF 30-35%, glob HK, GrIII DD.   Trifascicular block    Past Surgical History:  Procedure Laterality Date   BACK SURGERY     CARDIAC CATHETERIZATION     CLIPPING OF ATRIAL APPENDAGE N/A 08/29/2020   Procedure: CLIPPING OF ATRIAL APPENDAGE USING ATRICURE 455MM ATRICLIP FLEX-V;  Surgeon: GGrace Isaac MD;  [place] [facility]  Service: Open Heart Surgery;  Laterality: N/A;   CORONARY ARTERY BYPASS GRAFT N/A 08/29/2020   Procedure: CORONARY ARTERY BYPASS GRAFTING (CABG) X 5 USING LEFT INTERNAL MAMMARY ARTERY AND ENDOSCOPICALLY HARVESTED RIGHT GREATER SAPHENOUS VEIN. LIMA TO LAD, SVG TO OM SEQ TO CIRC, SVG TO PD, SVG TO DIAG.;  Surgeon: GGrace Isaac MD;  [place] [facility]  Service: Open Heart Surgery;  Laterality: N/A;  ENDOVEIN HARVEST OF GREATER SAPHENOUS VEIN Right 08/29/2020   Procedure: ENDOVEIN HARVEST OF GREATER SAPHENOUS VEIN;  Surgeon: Grace Isaac, MD;  Location: [place];  Service: Open Heart Surgery;  Laterality: Right;   HERNIA REPAIR     LEFT HEART CATH AND CORONARY ANGIOGRAPHY N/A 08/25/2020   Procedure: LEFT HEART CATH AND CORONARY ANGIOGRAPHY;  Surgeon: [name], MD;  Location: [place] [facility];  Service: Cardiovascular;  Laterality: N/A;   PACEMAKER IMPLANT N/A 10/12/2021   Procedure: PACEMAKER IMPLANT;  Surgeon: Vickie Epley, MD;  Location: [place] [facility];  Service: Cardiovascular;  Laterality: N/A;   RIGHT/LEFT HEART CATH AND CORONARY ANGIOGRAPHY N/A 08/31/2021   Procedure: RIGHT/LEFT HEART CATH AND CORONARY ANGIOGRAPHY;  Surgeon: [name], MD;  Location: [place] [facility];  Service: Cardiovascular;  Laterality: N/A;   TEE WITHOUT CARDIOVERSION N/A 08/29/2020   Procedure: TRANSESOPHAGEAL ECHOCARDIOGRAM (TEE);  Surgeon: Grace Isaac, MD;  Location: [place];  Service: Open Heart Surgery;  Laterality: N/A;    Family History  Problem Relation Age of Onset   Heart attack Mother    Social History   Tobacco Use   Smoking status: Former    Types: Cigarettes    Quit date: 07/26/1996    Years since quitting: 26.4   Smokeless tobacco: Never  Substance Use Topics   Alcohol use: Not Currently   Allergies  Allergen Reactions   Angiotensin Receptor Blockers     hyperkalemia   Metformin Diarrhea   Spironolactone     Hyperkalemia    Prior to Admission medications   Medication Sig Start Date End Date Taking? Authorizing Provider  acetaminophen (TYLENOL) 500 MG tablet Take 1,000 mg by mouth every 6 (six) hours as needed for mild pain, fever or headache.   Yes [provider]  albuterol (VENTOLIN HFA) 108 (90 Base) MCG/ACT inhaler Inhale 2 puffs into the lungs every 6 (six) hours as needed for wheezing or shortness of breath. 09/30/21  Yes [provider]  carvedilol (COREG) 6.25 MG tablet Take 1 tablet (6.25 mg total) by mouth 2 (two) times daily with a meal. 11/17/22 11/12/23 Yes Theora Gianotti, NP  clopidogrel (PLAVIX) 75 MG tablet TAKE 1 TABLET BY MOUTH EVERY DAY 05/17/21  Yes Loel Dubonnet, NP  dapagliflozin propanediol (FARXIGA) 10 MG TABS tablet Take 10 mg by mouth daily.   Yes [provider]  hydrALAZINE (APRESOLINE) 25 MG tablet Take 25 mg by mouth 2 times daily at 12 noon and 4 pm.   Yes [provider]  insulin detemir (LEVEMIR)  100 UNIT/ML injection Inject 0.15 mLs (15 Units total) into the skin at bedtime. 10/30/21  Yes Enzo Bi, MD  isosorbide mononitrate (IMDUR) 30 MG 24 hr tablet Take 1 tablet (30 mg total) by mouth 2 (two) times daily. 12/19/22  Yes Wieting, Richard, MD  magnesium oxide (MAG-OX) 400 MG tablet Take 1 tablet (400 mg total) by mouth daily. 01/20/22  Yes Vickie Epley, MD  pantoprazole (PROTONIX) 40 MG tablet Take 40 mg by mouth 2 (two) times daily.   Yes [provider]  potassium chloride (KLOR-CON M) 10 MEQ tablet Take 1 tablet (10 mEq total) by mouth daily. 12/19/22  Yes Wieting, Richard, MD  rosuvastatin (CRESTOR) 40 MG tablet TAKE 1 TABLET BY MOUTH EVERY DAY 12/14/21  Yes Arida, Mertie Clause, MD  sacubitril-valsartan (ENTRESTO) 49-51 MG Take 1 tablet by mouth 2 (two) times daily. 12/19/22  Yes Loletha Grayer, MD  torsemide (DEMADEX) 20 MG tablet Take 1 tablet (20 mg total) by mouth daily. Patient taking differently: Take 20 mg by mouth 3 (three) times a week. M, W, F 12/19/22  Yes Wieting, Richard, MD  fluticasone (FLOVENT HFA) 110 MCG/ACT inhaler Inhale 1 puff into the lungs daily. Patient not taking: Reported on 12/21/2022    [provider]  polyethylene glycol (MIRALAX / GLYCOLAX) 17 g packet Take 17 g by mouth daily as needed for moderate constipation. Patient not taking: Reported on 12/21/2022 12/19/22   Loletha Grayer, MD    Review of Systems  Constitutional:  Positive for fatigue. Negative for appetite change.  HENT:  Negative for congestion, postnasal drip and sneezing.   Eyes: Negative.   Respiratory:  Positive for shortness of breath ("very little). Negative for cough and chest tightness.   Cardiovascular:  Positive for leg swelling ("not much"). Negative for chest pain and palpitations.  Gastrointestinal:  Negative for abdominal distention and abdominal pain.  Endocrine: Negative.   Genitourinary: Negative.   Musculoskeletal:  Negative for back pain and neck pain.   Skin: Negative.   Allergic/Immunologic: Negative.   Neurological:  Negative for dizziness and light-headedness.  Hematological:  Negative for adenopathy. Does not bruise/bleed easily.  Psychiatric/Behavioral:  Negative for dysphoric mood and sleep disturbance (sleeping on 1 pillows). The patient is not nervous/anxious.    Vitals:   12/21/22 0942  BP: (!) 138/91  Pulse: 70  Resp: 18  SpO2: 98%  Weight: 215 lb 8 oz (97.8 kg)   Wt Readings from Last 3 Encounters:  12/21/22 215 lb 8 oz (97.8 kg)  12/19/22 211 lb 3.2 oz (95.8 kg)  11/17/22 224 lb 3.2 oz (101.7 kg)   Lab Results  Component Value Date   CREATININE 1.90 (H) 12/19/2022   CREATININE 1.64 (H) 12/18/2022   CREATININE 1.67 (H) 12/17/2022   Physical Exam Vitals and nursing note reviewed.  Constitutional:      Appearance: He is well-developed.  HENT:     Head: Normocephalic and atraumatic.  Neck:     Vascular: No JVD.  Cardiovascular:     Rate and Rhythm: Normal rate and regular rhythm.  Pulmonary:     Effort: Pulmonary effort is normal. No tachypnea or accessory muscle usage.  Abdominal:     Palpations: Abdomen is soft.     Tenderness: There is no abdominal tenderness.  Musculoskeletal:        General: No tenderness.     Cervical back: Neck supple.     Right lower leg: No tenderness. Edema (2+ pitting) present.     Left lower leg: No tenderness. Edema (2+ pitting) present.  Skin:    General: Skin is warm and dry.  Neurological:     General: No focal deficit present.     Mental Status: He is alert and oriented to person, place, and time.  Psychiatric:        Mood and Affect: Mood normal.        Behavior: Behavior normal.   Assessment & Plan:  1: Chronic heart failure with reduced ejection fraction- - NYHA class II - euvolemic today - weighing daily; reminded to call for an overnight weight gain of >2 pounds or a weekly weight gain of > 5 pounds - weight down 9 pounds from from last visit here 3 months  ago - not adding salt and he says that his wife doesn't cook with salt either - had pacemaker implanted 10/12/21 - on GDMT of carvedilol 6.65m,  farxiga 80m and entresto 49/561m- BMP today as torsemide will probably need to be increased - had hyperkalemia with spironolactone - BNP 12/16/22 was 3584.8  2: HTN with CKD- - BP 138/91 - saw PCP 10/19/22 - BMP 12/19/22 reviewed and showed sodium 138, potassium 4.3, creatinine 1.9 and GFR 34  3: Type 2 DM- - A1c 12/15/22 was 7.9% - home glucose this morning was 120  4: CAD- - saw cardiology (BSharolyn Douglas1/10/24  5: Lymphedema-  - stage 2 - tries to elevate legs when sitting for long periods of time - wearing compression socks daily but doesn't have them on today   Mediation bottles reviewed.  Return in 1 week, sooner if needed.

## 2022-12-21 ENCOUNTER — Other Ambulatory Visit
Admission: RE | Admit: 2022-12-21 | Discharge: 2022-12-21 | Disposition: A | Discharge status: Home / Self Care | Payer: Medicare HMO | Source: Ambulatory Visit | Attending: Family | Admitting: Family

## 2022-12-21 ENCOUNTER — Encounter: Payer: Self-pay | Admitting: Family

## 2022-12-21 ENCOUNTER — Telehealth: Payer: Self-pay | Admitting: Family

## 2022-12-21 ENCOUNTER — Ambulatory Visit (HOSPITAL_BASED_OUTPATIENT_CLINIC_OR_DEPARTMENT_OTHER): Payer: Medicare HMO | Admitting: Family

## 2022-12-21 VITALS — BP 138/91 | HR 70 | Resp 18 | Wt 215.5 lb

## 2022-12-21 DIAGNOSIS — I2510 Atherosclerotic heart disease of native coronary artery without angina pectoris: Secondary | ICD-10-CM | POA: Insufficient documentation

## 2022-12-21 DIAGNOSIS — Z794 Long term (current) use of insulin: Secondary | ICD-10-CM | POA: Insufficient documentation

## 2022-12-21 DIAGNOSIS — N1832 Chronic kidney disease, stage 3b: Secondary | ICD-10-CM

## 2022-12-21 DIAGNOSIS — I255 Ischemic cardiomyopathy: Secondary | ICD-10-CM | POA: Insufficient documentation

## 2022-12-21 DIAGNOSIS — I272 Pulmonary hypertension, unspecified: Secondary | ICD-10-CM | POA: Insufficient documentation

## 2022-12-21 DIAGNOSIS — I890 Lymphedema, not elsewhere classified: Secondary | ICD-10-CM | POA: Insufficient documentation

## 2022-12-21 DIAGNOSIS — Z7984 Long term (current) use of oral hypoglycemic drugs: Secondary | ICD-10-CM | POA: Insufficient documentation

## 2022-12-21 DIAGNOSIS — Z7902 Long term (current) use of antithrombotics/antiplatelets: Secondary | ICD-10-CM | POA: Insufficient documentation

## 2022-12-21 DIAGNOSIS — Z8674 Personal history of sudden cardiac arrest: Secondary | ICD-10-CM | POA: Insufficient documentation

## 2022-12-21 DIAGNOSIS — Z951 Presence of aortocoronary bypass graft: Secondary | ICD-10-CM | POA: Insufficient documentation

## 2022-12-21 DIAGNOSIS — I10 Essential (primary) hypertension: Secondary | ICD-10-CM | POA: Diagnosis not present

## 2022-12-21 DIAGNOSIS — Z950 Presence of cardiac pacemaker: Secondary | ICD-10-CM | POA: Insufficient documentation

## 2022-12-21 DIAGNOSIS — Z7951 Long term (current) use of inhaled steroids: Secondary | ICD-10-CM | POA: Insufficient documentation

## 2022-12-21 DIAGNOSIS — E785 Hyperlipidemia, unspecified: Secondary | ICD-10-CM | POA: Insufficient documentation

## 2022-12-21 DIAGNOSIS — Z87891 Personal history of nicotine dependence: Secondary | ICD-10-CM | POA: Insufficient documentation

## 2022-12-21 DIAGNOSIS — N183 Chronic kidney disease, stage 3 unspecified: Secondary | ICD-10-CM | POA: Insufficient documentation

## 2022-12-21 DIAGNOSIS — E875 Hyperkalemia: Secondary | ICD-10-CM | POA: Insufficient documentation

## 2022-12-21 DIAGNOSIS — I130 Hypertensive heart and chronic kidney disease with heart failure and stage 1 through stage 4 chronic kidney disease, or unspecified chronic kidney disease: Secondary | ICD-10-CM | POA: Insufficient documentation

## 2022-12-21 DIAGNOSIS — J90 Pleural effusion, not elsewhere classified: Secondary | ICD-10-CM | POA: Insufficient documentation

## 2022-12-21 DIAGNOSIS — Z79899 Other long term (current) drug therapy: Secondary | ICD-10-CM | POA: Insufficient documentation

## 2022-12-21 DIAGNOSIS — I5022 Chronic systolic (congestive) heart failure: Secondary | ICD-10-CM | POA: Diagnosis not present

## 2022-12-21 DIAGNOSIS — E1122 Type 2 diabetes mellitus with diabetic chronic kidney disease: Secondary | ICD-10-CM | POA: Insufficient documentation

## 2022-12-21 LAB — BASIC METABOLIC PANEL
Anion gap: 15 (ref 5–15)
BUN: 32 mg/dL — ABNORMAL HIGH (ref 8–23)
CO2: 24 mmol/L (ref 22–32)
Calcium: 8.6 mg/dL — ABNORMAL LOW (ref 8.9–10.3)
Chloride: 100 mmol/L (ref 98–111)
Creatinine, Ser: 2.29 mg/dL — ABNORMAL HIGH (ref 0.61–1.24)
GFR, Estimated: 27 mL/min — ABNORMAL LOW (ref >60)
Glucose, Bld: 138 mg/dL — ABNORMAL HIGH (ref 70–99)
Potassium: 3.8 mmol/L (ref 3.5–5.1)
Sodium: 139 mmol/L (ref 135–145)

## 2022-12-21 MED ORDER — TORSEMIDE 20 MG PO TABS: 20.0000 mg | ORAL_TABLET | Freq: Every day | ORAL | 0 refills | Status: DC

## 2022-12-21 MED ORDER — POTASSIUM CHLORIDE CRYS ER 10 MEQ PO TBCR: 20.0000 meq | EXTENDED_RELEASE_TABLET | Freq: Every day | ORAL | 0 refills | Status: DC

## 2022-12-21 NOTE — Patient Instructions (Addendum)
Continue weighing daily and call for an overnight weight gain of 3 pounds or more or a weekly weight gain of more than 5 pounds.   If you have voicemail, please make sure your mailbox is cleaned out so that we may leave a message and please make sure to listen to any voicemails.    I will call you when I get your lab results back

## 2022-12-21 NOTE — Telephone Encounter (Signed)
Spoke with patient and advised of BMP results obtained earlier today. Will increase his torsemide to 24m daily (was doing M, W, F) and increase potassium to 2 tablets daily (225m total). Will check labs next week.   Patient repeated instructions back and verbalized understanding.

## 2022-12-26 ENCOUNTER — Other Ambulatory Visit: Payer: Self-pay | Admitting: Family

## 2022-12-29 ENCOUNTER — Encounter: Payer: Medicare HMO | Admitting: Family

## 2022-12-30 ENCOUNTER — Encounter: Payer: Self-pay | Admitting: Pharmacist

## 2022-12-30 ENCOUNTER — Other Ambulatory Visit
Admission: RE | Admit: 2022-12-30 | Discharge: 2022-12-30 | Disposition: A | Discharge status: Home / Self Care | Payer: Medicare HMO | Source: Ambulatory Visit | Attending: Family | Admitting: Family

## 2022-12-30 ENCOUNTER — Encounter: Payer: Self-pay | Admitting: Family

## 2022-12-30 ENCOUNTER — Ambulatory Visit (HOSPITAL_BASED_OUTPATIENT_CLINIC_OR_DEPARTMENT_OTHER): Payer: Medicare HMO | Admitting: Family

## 2022-12-30 VITALS — BP 152/84 | HR 65 | Resp 18 | Wt 223.0 lb

## 2022-12-30 DIAGNOSIS — I10 Essential (primary) hypertension: Secondary | ICD-10-CM | POA: Diagnosis not present

## 2022-12-30 DIAGNOSIS — E1122 Type 2 diabetes mellitus with diabetic chronic kidney disease: Secondary | ICD-10-CM

## 2022-12-30 DIAGNOSIS — I890 Lymphedema, not elsewhere classified: Secondary | ICD-10-CM

## 2022-12-30 DIAGNOSIS — N1832 Chronic kidney disease, stage 3b: Secondary | ICD-10-CM

## 2022-12-30 DIAGNOSIS — I5022 Chronic systolic (congestive) heart failure: Secondary | ICD-10-CM

## 2022-12-30 DIAGNOSIS — Z794 Long term (current) use of insulin: Secondary | ICD-10-CM

## 2022-12-30 DIAGNOSIS — I2510 Atherosclerotic heart disease of native coronary artery without angina pectoris: Secondary | ICD-10-CM

## 2022-12-30 LAB — BASIC METABOLIC PANEL
Anion gap: 10 (ref 5–15)
BUN: 25 mg/dL — ABNORMAL HIGH (ref 8–23)
CO2: 25 mmol/L (ref 22–32)
Calcium: 8.1 mg/dL — ABNORMAL LOW (ref 8.9–10.3)
Chloride: 105 mmol/L (ref 98–111)
Creatinine, Ser: 1.75 mg/dL — ABNORMAL HIGH (ref 0.61–1.24)
GFR, Estimated: 38 mL/min — ABNORMAL LOW (ref >60)
Glucose, Bld: 94 mg/dL (ref 70–99)
Potassium: 3.9 mmol/L (ref 3.5–5.1)
Sodium: 140 mmol/L (ref 135–145)

## 2022-12-30 NOTE — Patient Instructions (Signed)
Start wearing compression socks every day with removal at bedtime. Elevate your legs when sitting for long periods of time.

## 2022-12-30 NOTE — Progress Notes (Signed)
Patient ID: [name], male    DOB: [birth year], 88 y.o.   MRN: [id number]  HPI  [name] is a 88 y/o male with a history of heart block, CAD, DM, hyperlipidemia, HTN, pacemaker implantation, CKD, previous tobacco use and chronic heart failure.   Echo 12/16/22 showed an EF of 30-35% along with moderate LVH, mild LAE and trivial Duane. Echo report from 01/05/22 reviewed and showed an EF of 30-35% along with mild LVH. Echo report from 08/27/21 reviewed and showed an EF of 35-40% along with mild LAE.   RHC/LHC done 08/31/21 & showed: 1.  Severe underlying three-vessel coronary artery disease with patent grafts including LIMA to LAD, SVG to large diagonal, sequential SVG to OM /distal left circumflex and SVG to right PDA. 2.  Right heart catheterization showed normal right and left-sided filling pressures, minimal pulmonary hypertension and normal cardiac output.  Admitted 12/15/22 due to chest pain and DOE due to a/c heart failure. CTA negative for PE but showed small bilateral pleural effusions. IV diuresis.   He presents today for a HF follow-up visit with a chief complaint of minimal SOB with moderate exertion. Describes this as chronic in nature. Has associated fatigue & pedal edema (improving) along with this. Denies any difficulty sleeping, dizziness, abdominal distention, palpitations, chest pain, cough or weight gain.   Now taking torsemide 32m daily and 186m potassium daily for the last 9 days & says that his swelling and SOB is improving.   Past Medical History:  Diagnosis Date   AV block, Mobitz 1    CAD (coronary artery disease)    a. 08/2019 VF arrest-->sev 3vd on cath-->CABGx5 (LIMA->LAD, VG->OM->LCX, VG->RPDA, VG->Diag); b. 08/2021 Cath: sev native multivessel dzs w/ 5/5 patent grafts. Nl filling pressures->Med rx.   CKD (chronic kidney disease), stage III (HCC)    Diabetes mellitus without complication ([facility]   HFrEF (heart failure with reduced ejection fraction) ([place]   a.  08/2020 Echo: EF 40-45%; b. 11/2020 Echo: EF 55%, no rwma, mild LVH, Gr2 DD. nl RV fxn; c. 08/2021 Echo: EF 35-40%, glob HK w/ ant/antsept/apical HK. Reduced RV fxn. Mildly dil LA; d. 10/2021 s/p CRT-P; e. 12/2021 Echo: EF 30-35%, glob HK, GrIII DD. Mod red RV fxn, mild LAE, mild Duane, AoV sclerosis.   Hyperlipidemia LDL goal <70    Hypertension    Intermittent [place] heart block ([place]   a. 08/2021 noted on Zio; b. 10/2021 s/p Abbott Allure RF CRT-P (Ser # [id number]   Ischemic cardiomyopathy    a. 08/2020 Echo: EF 40-45%; b. 11/2020 Echo: EF 55%; c. 08/2021 Echo: EF 35-40%; d. 10/2021 s/p CRT-P; e. 12/2021 Echo: EF 30-35%, glob HK, GrIII DD.   Trifascicular block    Past Surgical History:  Procedure Laterality Date   BACK SURGERY     CARDIAC CATHETERIZATION     CLIPPING OF ATRIAL APPENDAGE N/A 08/29/2020   Procedure: CLIPPING OF ATRIAL APPENDAGE USING ATRICURE 4518M ATRICLIP FLEX-V;  Surgeon: GeGrace IsaacMD;  [place] Service: [facility];  Laterality: N/A;   CORONARY ARTERY BYPASS GRAFT N/A 08/29/2020   Procedure: CORONARY ARTERY BYPASS GRAFTING (CABG) X 5 USING LEFT INTERNAL MAMMARY ARTERY AND ENDOSCOPICALLY HARVESTED RIGHT GREATER SAPHENOUS VEIN. LIMA TO LAD, SVG TO OM SEQ TO CIRC, SVG TO PD, SVG TO DIAG.;  Surgeon: GeGrace IsaacMD;  [place] [facility] Service: Open Heart Surgery;  Laterality: N/A;   ENDOVEIN HARVEST OF GREATER SAPHENOUS VEIN  Right 08/29/2020   Procedure: ENDOVEIN HARVEST OF GREATER SAPHENOUS VEIN;  Surgeon: Grace Isaac, MD;  Location: [place];  Service: Open Heart Surgery;  Laterality: Right;   HERNIA REPAIR     LEFT HEART CATH AND CORONARY ANGIOGRAPHY N/A 08/25/2020   Procedure: LEFT HEART CATH AND CORONARY ANGIOGRAPHY;  Surgeon: [name], MD;  Location: [place] [facility];  Service: Cardiovascular;  Laterality: N/A;   PACEMAKER IMPLANT N/A 10/12/2021   Procedure: PACEMAKER IMPLANT;  Surgeon: Vickie Epley, MD;  Location: [place] [facility];  Service: Cardiovascular;  Laterality: N/A;   RIGHT/LEFT HEART CATH AND CORONARY ANGIOGRAPHY N/A 08/31/2021   Procedure: RIGHT/LEFT HEART CATH AND CORONARY ANGIOGRAPHY;  Surgeon: [name], MD;  Location: [place] [facility];  Service: Cardiovascular;  Laterality: N/A;   TEE WITHOUT CARDIOVERSION N/A 08/29/2020   Procedure: TRANSESOPHAGEAL ECHOCARDIOGRAM (TEE);  Surgeon: Grace Isaac, MD;  Location: [place];  Service: Open Heart Surgery;  Laterality: N/A;    Family History  Problem Relation Age of Onset   Heart attack Mother    Social History   Tobacco Use   Smoking status: Former    Types: Cigarettes    Quit date: 07/26/1996    Years since quitting: 26.4   Smokeless tobacco: Never  Substance Use Topics   Alcohol use: Not Currently   Allergies  Allergen Reactions   Angiotensin Receptor Blockers     hyperkalemia   Metformin Diarrhea   Spironolactone     Hyperkalemia    Prior to Admission medications   Medication Sig Start Date End Date Taking? Authorizing Provider  acetaminophen (TYLENOL) 500 MG tablet Take 1,000 mg by mouth every 6 (six) hours as needed for mild pain, fever or headache.   Yes [provider]  acyclovir (ZOVIRAX) 800 MG tablet Take 800 mg by mouth 5 (five) times daily.   Yes [provider]  aspirin EC 81 MG tablet Take 81 mg by mouth daily. Swallow whole.   Yes [provider]  carvedilol (COREG) 6.25 MG tablet Take 1 tablet (6.25 mg total) by mouth 2 (two) times daily with a meal. 11/17/22 11/12/23 Yes Theora Gianotti, NP  clopidogrel (PLAVIX) 75 MG tablet TAKE 1 TABLET BY MOUTH EVERY DAY 05/17/21  Yes Loel Dubonnet, NP  dapagliflozin propanediol (FARXIGA) 10 MG TABS tablet Take 10 mg by mouth daily.   Yes [provider]  fluticasone (FLOVENT HFA) 110 MCG/ACT inhaler Inhale 1 puff into the lungs daily.   Yes [provider]  hydrALAZINE (APRESOLINE) 25 MG tablet Take 25 mg by  mouth 2 times daily at 12 noon and 4 pm.   Yes [provider]  insulin detemir (LEVEMIR) 100 UNIT/ML injection Inject 0.15 mLs (15 Units total) into the skin at bedtime. 10/30/21  Yes Enzo Bi, MD  isosorbide mononitrate (IMDUR) 30 MG 24 hr tablet Take 1 tablet (30 mg total) by mouth 2 (two) times daily. 12/19/22  Yes Wieting, Richard, MD  magnesium oxide (MAG-OX) 400 MG tablet Take 1 tablet (400 mg total) by mouth daily. 01/20/22  Yes Vickie Epley, MD  pantoprazole (PROTONIX) 40 MG tablet Take 40 mg by mouth 2 (two) times daily.   Yes [provider]  polyethylene glycol (MIRALAX / GLYCOLAX) 17 g packet Take 17 g by mouth daily as needed for moderate constipation. 12/19/22  Yes Wieting, Richard, MD  potassium chloride (KLOR-CON) 10 MEQ tablet Take 10 mEq by mouth daily.  Yes [provider]  rosuvastatin (CRESTOR) 40 MG tablet TAKE 1 TABLET BY MOUTH EVERY DAY 12/14/21  Yes Arida, Mertie Clause, MD  sacubitril-valsartan (ENTRESTO) 49-51 MG Take 1 tablet by mouth 2 (two) times daily. 12/19/22  Yes Wieting, Richard, MD  torsemide (DEMADEX) 20 MG tablet Take 1 tablet (20 mg total) by mouth daily. 12/21/22  Yes Johnella Crumm, Otila Kluver A, FNP  albuterol (VENTOLIN HFA) 108 (90 Base) MCG/ACT inhaler Inhale 2 puffs into the lungs every 6 (six) hours as needed for wheezing or shortness of breath. Patient not taking: Reported on 12/30/2022 09/30/21   [provider]   Review of Systems  Constitutional:  Positive for fatigue. Negative for appetite change.  HENT:  Negative for congestion, postnasal drip and sneezing.   Eyes: Negative.   Respiratory:  Positive for shortness of breath ("very little"). Negative for cough and chest tightness.   Cardiovascular:  Positive for leg swelling ("improving"). Negative for chest pain and palpitations.  Gastrointestinal:  Negative for abdominal distention and abdominal pain.  Endocrine: Negative.   Genitourinary: Negative.   Musculoskeletal:   Negative for back pain and neck pain.  Skin: Negative.   Allergic/Immunologic: Negative.   Neurological:  Negative for dizziness and light-headedness.  Hematological:  Negative for adenopathy. Does not bruise/bleed easily.  Psychiatric/Behavioral:  Negative for dysphoric mood and sleep disturbance (sleeping on 1 pillows). The patient is not nervous/anxious.    Vitals:   12/30/22 1126  BP: (!) 152/84  Pulse: 65  Resp: 18  SpO2: 100%  Weight: 223 lb (101.2 kg)   Wt Readings from Last 3 Encounters:  12/30/22 223 lb (101.2 kg)  12/21/22 215 lb 8 oz (97.8 kg)  12/19/22 211 lb 3.2 oz (95.8 kg)   Lab Results  Component Value Date   CREATININE 2.29 (H) 12/21/2022   CREATININE 1.90 (H) 12/19/2022   CREATININE 1.64 (H) 12/18/2022   Physical Exam Vitals and nursing note reviewed.  Constitutional:      Appearance: He is well-developed.  HENT:     Head: Normocephalic and atraumatic.  Neck:     Vascular: No JVD.  Cardiovascular:     Rate and Rhythm: Normal rate and regular rhythm.  Pulmonary:     Effort: Pulmonary effort is normal. No tachypnea or accessory muscle usage.  Abdominal:     Palpations: Abdomen is soft.     Tenderness: There is no abdominal tenderness.  Musculoskeletal:        General: No tenderness.     Cervical back: Neck supple.     Right lower leg: No tenderness. Edema (2+ pitting) present.     Left lower leg: No tenderness. Edema (2+ pitting) present.  Skin:    General: Skin is warm and dry.  Neurological:     General: No focal deficit present.     Mental Status: He is alert and oriented to person, place, and time.  Psychiatric:        Mood and Affect: Mood normal.        Behavior: Behavior normal.   Assessment & Plan:  1: Chronic heart failure with reduced ejection fraction- - NYHA class II - euvolemic today - weighing daily; reminded to call for an overnight weight gain of >2 pounds or a weekly weight gain of > 5 pounds - weight up 8 pounds from from  last visit here 9 days ago - not adding salt and he says that his wife doesn't cook with salt either - had pacemaker implanted 10/12/21 -  carvedilol 6.75m BID - farxiga 116mdaily - entresto 49/516mID; pending lab work will increase this to 97/103m74mD - torsemide 20mg65mly - potassium 40meq90mly - hydralazine 25mg B63misosorbide MN 30mg BI90mBMP today  - had hyperkalemia with spironolactone - BNP 12/16/22 was 3584.8 - Pharm D reconciled meds w/ patient  2: HTN with CKD- - BP 152/84 - saw PCP 10/19/22; returns 01/24/23 - BMP 12/21/22 reviewed and showed sodium 139, potassium 3.8, creatinine 2.29 and GFR 27  3: Type 2 DM- - A1c 12/15/22 was 7.9% - levemir insulin at bedtime  4: CAD- - saw cardiology ([facility]) Sharolyn Douglas4 - clopidogrel 75mg dai30m crestor 40mg dail21m: Lymphedema-  - stage 2 - tries to elevate legs when sitting for long periods of time - encouraged to wear compression socks daily with removal at bedtime   Medication bottles reviewed.  Return in 1 month, sooner if needed

## 2022-12-31 ENCOUNTER — Telehealth: Payer: Self-pay

## 2022-12-31 MED ORDER — SACUBITRIL-VALSARTAN 97-103 MG PO TABS: 1.0000 | ORAL_TABLET | Freq: Two times a day (BID) | ORAL | 6 refills | Status: DC

## 2022-12-31 NOTE — Telephone Encounter (Signed)
Spoke with patient and reviewed lab work and medication changes. Pt verbalized understanding and states back that he will increase entresto to 97/103 mg twice daily. He verbalized understanding that he will finish his current bottle of 49/51 mg tablets by taking two of those tablets twice daily until he picks up new prescription, at which time he will resume taking one tablet twice daily.  Pt had no questions or concerns. New prescription sent to patient's pharmacy.

## 2022-12-31 NOTE — Telephone Encounter (Signed)
-----   Message from Alisa Graff, [place] sent at 12/30/2022  3:15 PM EST ----- Kidney function has improved. Increase entresto to 97/167m BID. You can finish your current bottle by taking 2 tablets in the morning and 2 tablets in the evening. Will need a new RX sent in

## 2023-01-03 NOTE — Progress Notes (Signed)
[facility] - PHARMACIST COUNSELING NOTE  *HFrEF*  Guideline-Directed Medical Therapy/Evidence Based Medicine  ACE/ARB/ARNI: Delene Loll 49/'51mg'$  twice daily,  hydralazine/isosorbide Beta Blocker: carvedilol 6.'25mg'$  twice daily Aldosterone Antagonist:  none- hyperkalemia in the past Diuretic: Torsemide '20mg'$  daily SGLT2i: Farxiga '10mg'$  daily  Adherence Assessment  Do you ever forget to take your medication? '[]'$ Yes '[x]'$ No  Do you ever skip doses due to side effects? '[]'$ Yes '[x]'$ No  Do you have trouble affording your medicines? '[]'$ Yes '[x]'$ No  Are you ever unable to pick up your medication due to transportation difficulties? '[]'$ Yes '[x]'$ No  Do you ever stop taking your medications because you don't believe they are helping? '[]'$ Yes '[x]'$ No  Do you check your weight daily? '[x]'$ Yes '[]'$ No   Adherence strategy: pill box filled by wife  Barriers to obtaining medications: none  Vital signs: HR 65, BP 152/84, weight (pounds) 223 lbs  ECHO: Date 08/27/21, EF 35-40%, The left ventricle has moderately decreased function. The left ventricle demonstrates global hypokinesis. Concern for anterior, anteroseptal and apical hypokinesis. The left ventricular  internal cavity size was mildly dilated      Latest Ref Rng & Units 12/30/2022   12:08 PM 12/21/2022   10:26 AM 12/19/2022    5:36 AM  BMP  Glucose 70 - 99 mg/dL 94  138  99   BUN 8 - 23 mg/dL 25  32  24   Creatinine 0.61 - 1.24 mg/dL 1.75  2.29  1.90   Sodium 135 - 145 mmol/L 140  139  138   Potassium 3.5 - 5.1 mmol/L 3.9  3.8  4.3   Chloride 98 - 111 mmol/L 105  100  103   CO2 22 - 32 mmol/L '25  24  27   '$ Calcium 8.9 - 10.3 mg/dL 8.1  8.6  8.2     Past Medical History:  Diagnosis Date   AV block, Mobitz 1    CAD (coronary artery disease)    a. 08/2019 VF arrest-->sev 3vd on cath-->CABGx5 (LIMA->LAD, VG->OM->LCX, VG->RPDA, VG->Diag); b. 08/2021 Cath: sev native multivessel dzs w/ 5/5 patent grafts. Nl filling  pressures->Med rx.   CKD (chronic kidney disease), stage III (HCC)    Diabetes mellitus without complication ([place])    HFrEF (heart failure with reduced ejection fraction) ([place])    a. 08/2020 Echo: EF 40-45%; b. 11/2020 Echo: EF 55%, no rwma, mild LVH, Gr2 DD. nl RV fxn; c. 08/2021 Echo: EF 35-40%, glob HK w/ ant/antsept/apical HK. Reduced RV fxn. Mildly dil LA; d. 10/2021 s/p CRT-P; e. 12/2021 Echo: EF 30-35%, glob HK, GrIII DD. Mod red RV fxn, mild LAE, mild MR, AoV sclerosis.   Hyperlipidemia LDL goal <70    Hypertension    Intermittent Complete heart block ([place])    a. 08/2021 noted on Zio; b. 10/2021 s/p Abbott Allure RF CRT-P (Ser # [id number]).   Ischemic cardiomyopathy    a. 08/2020 Echo: EF 40-45%; b. 11/2020 Echo: EF 55%; c. 08/2021 Echo: EF 35-40%; d. 10/2021 s/p CRT-P; e. 12/2021 Echo: EF 30-35%, glob HK, GrIII DD.   Trifascicular block     ASSESSMENT 88 year old male who presents to the HF clinic for follow up. PMH includes CAD, HFrEF, CKD, DM, HTN, and HLD. Last acute care admission was 12/15/2022 for acute HF.   Medication reconciliation completed during OV. Currently taking torsemide '20mg'$  daily (only M,W,F previously). Noted 8 lbs weight gained in 9 days.  PLAN (recommendation)  Repeat BMET and diuresis per NP order  Time spent: 15 minutes  Gevorg Brum Rodriguez-Guzman PharmD, BCPS 01/03/2023 4:34 PM   Current Outpatient Medications:    acetaminophen (TYLENOL) 500 MG tablet, Take 1,000 mg by mouth every 6 (six) hours as needed for mild pain, fever or headache., Disp: , Rfl:    acyclovir (ZOVIRAX) 800 MG tablet, Take 800 mg by mouth 5 (five) times daily., Disp: , Rfl:    albuterol (VENTOLIN HFA) 108 (90 Base) MCG/ACT inhaler, Inhale 2 puffs into the lungs every 6 (six) hours as needed for wheezing or shortness of breath. (Patient not taking: Reported on 12/30/2022), Disp: , Rfl:    aspirin EC 81 MG tablet, Take 81 mg by mouth daily. Swallow whole., Disp: , Rfl:    carvedilol (COREG)  6.25 MG tablet, Take 1 tablet (6.25 mg total) by mouth 2 (two) times daily with a meal., Disp: 180 tablet, Rfl: 3   clopidogrel (PLAVIX) 75 MG tablet, TAKE 1 TABLET BY MOUTH EVERY DAY, Disp: 90 tablet, Rfl: 1   dapagliflozin propanediol (FARXIGA) 10 MG TABS tablet, Take 10 mg by mouth daily., Disp: , Rfl:    fluticasone (FLOVENT HFA) 110 MCG/ACT inhaler, Inhale 1 puff into the lungs daily., Disp: , Rfl:    hydrALAZINE (APRESOLINE) 25 MG tablet, Take 25 mg by mouth 2 times daily at 12 noon and 4 pm., Disp: , Rfl:    insulin detemir (LEVEMIR) 100 UNIT/ML injection, Inject 0.15 mLs (15 Units total) into the skin at bedtime., Disp: 13.5 mL, Rfl: 0   isosorbide mononitrate (IMDUR) 30 MG 24 hr tablet, Take 1 tablet (30 mg total) by mouth 2 (two) times daily., Disp: 60 tablet, Rfl: 0   magnesium oxide (MAG-OX) 400 MG tablet, Take 1 tablet (400 mg total) by mouth daily., Disp: 90 tablet, Rfl: 1   pantoprazole (PROTONIX) 40 MG tablet, Take 40 mg by mouth 2 (two) times daily., Disp: , Rfl:    polyethylene glycol (MIRALAX / GLYCOLAX) 17 g packet, Take 17 g by mouth daily as needed for moderate constipation., Disp: 30 each, Rfl: 0   potassium chloride (KLOR-CON) 10 MEQ tablet, Take 10 mEq by mouth daily., Disp: , Rfl:    rosuvastatin (CRESTOR) 40 MG tablet, TAKE 1 TABLET BY MOUTH EVERY DAY, Disp: 90 tablet, Rfl: 0   sacubitril-valsartan (ENTRESTO) 97-103 MG, Take 1 tablet by mouth 2 (two) times daily., Disp: 60 tablet, Rfl: 6   torsemide (DEMADEX) 20 MG tablet, Take 1 tablet (20 mg total) by mouth daily., Disp: 30 tablet, Rfl: 0   MEDICATION ADHERENCES TIPS AND STRATEGIES Taking medication as prescribed improves patient outcomes in heart failure (reduces hospitalizations, improves symptoms, increases survival) Side effects of medications can be managed by decreasing doses, switching agents, stopping drugs, or adding additional therapy. Please let someone in the [facility] know if you have having  bothersome side effects so we can modify your regimen. Do not alter your medication regimen without talking to [place].  Medication reminders can help patients remember to take drugs on time. If you are missing or forgetting doses you can try linking behaviors, using pill boxes, or an electronic reminder like an alarm on your phone or an app. Some people can also get automated phone calls as medication reminders.

## 2023-01-04 ENCOUNTER — Telehealth: Payer: Self-pay

## 2023-01-04 NOTE — Telephone Encounter (Signed)
LMOVM for patient to give [place] a call to get help with setting up his home remote monitor.

## 2023-01-11 ENCOUNTER — Ambulatory Visit (INDEPENDENT_AMBULATORY_CARE_PROVIDER_SITE_OTHER): Payer: Medicare HMO

## 2023-01-11 DIAGNOSIS — I255 Ischemic cardiomyopathy: Secondary | ICD-10-CM

## 2023-01-14 ENCOUNTER — Telehealth: Payer: Self-pay

## 2023-01-14 LAB — CUP PACEART REMOTE DEVICE CHECK
Battery Remaining Longevity: 69 mo
Battery Remaining Percentage: 78 %
Battery Voltage: 2.99 V
Brady Statistic AP VP Percent: 60 %
Brady Statistic AP VS Percent: 1 %
Brady Statistic AS VP Percent: 38 %
Brady Statistic AS VS Percent: 1 %
Brady Statistic RA Percent Paced: 58 %
Date Time Interrogation Session: 20240308030135
Implantable Lead Connection Status: 753985
Implantable Lead Connection Status: 753985
Implantable Lead Connection Status: 753985
Implantable Lead Implant Date: 20221205
Implantable Lead Implant Date: 20221205
Implantable Lead Implant Date: 20221205
Implantable Lead Location: 753858
Implantable Lead Location: 753859
Implantable Lead Location: 753860
Implantable Lead Model: 3830
Implantable Pulse Generator Implant Date: 20221205
Lead Channel Impedance Value: 400 Ohm
Lead Channel Impedance Value: 430 Ohm
Lead Channel Impedance Value: 480 Ohm
Lead Channel Pacing Threshold Amplitude: 0.75 V
Lead Channel Pacing Threshold Amplitude: 0.75 V
Lead Channel Pacing Threshold Amplitude: 1.375 V
Lead Channel Pacing Threshold Pulse Width: 0.5 ms
Lead Channel Pacing Threshold Pulse Width: 0.5 ms
Lead Channel Pacing Threshold Pulse Width: 0.5 ms
Lead Channel Sensing Intrinsic Amplitude: 10.2 mV
Lead Channel Sensing Intrinsic Amplitude: 2.5 mV
Lead Channel Setting Pacing Amplitude: 2 V
Lead Channel Setting Pacing Amplitude: 2 V
Lead Channel Setting Pacing Amplitude: 2.375
Lead Channel Setting Pacing Pulse Width: 0.5 ms
Lead Channel Setting Pacing Pulse Width: 0.5 ms
Lead Channel Setting Sensing Sensitivity: 2 mV
Pulse Gen Model: 3222
Pulse Gen Serial Number: 3901949

## 2023-01-14 NOTE — Telephone Encounter (Signed)
Spoke with pt via phone, pt is  aware, agreeable, and verbalized understanding with plan. 2 torsemide and 2 potassium tablets daily for the next 3 days.

## 2023-01-14 NOTE — Telephone Encounter (Signed)
Scheduled remote reviewed. Normal device function.   Alert status for AF 3/7, longest duration 73mn, controlled rates, no OAC, DAPT - route to triage Presenting AP/BiV pace [place] with downward trend x18 days

## 2023-01-17 ENCOUNTER — Telehealth: Payer: Self-pay

## 2023-01-17 DIAGNOSIS — I4891 Unspecified atrial fibrillation: Secondary | ICD-10-CM

## 2023-01-17 NOTE — Telephone Encounter (Signed)
Left message for patient to call back  

## 2023-01-17 NOTE — Telephone Encounter (Signed)
-----   Message from Vickie Epley, MD sent at 01/16/2023 11:42 AM EDT ----- Remote interrogation reviewed.  AF episodes, longest now 22 minutes. Not on [place].  Carly/Stacy, can you all get him into AF clinic to discuss the pros/cons of anticoagulation. I think it would be reasonable to continue monitoring his burden at this stage but it should be discussed in clinic for shared decision making.  Lysbeth Galas T. Quentin Ore, MD, [facility], [facility] Cardiac Electrophysiology

## 2023-01-25 ENCOUNTER — Telehealth: Payer: Self-pay

## 2023-01-25 NOTE — Telephone Encounter (Signed)
-----   Message from Damian Leavell, RN sent at 01/13/2023  7:52 AM EST ----- Did you mind checking in with him again?  We still haven't received a transmission

## 2023-01-25 NOTE — Telephone Encounter (Signed)
Transmission received 01/14/2023

## 2023-01-27 ENCOUNTER — Encounter: Payer: Self-pay | Admitting: Pharmacist

## 2023-01-27 ENCOUNTER — Ambulatory Visit (HOSPITAL_BASED_OUTPATIENT_CLINIC_OR_DEPARTMENT_OTHER): Payer: Medicare HMO | Admitting: Family

## 2023-01-27 ENCOUNTER — Other Ambulatory Visit
Admission: RE | Admit: 2023-01-27 | Discharge: 2023-01-27 | Disposition: A | Discharge status: Home / Self Care | Payer: Medicare HMO | Source: Ambulatory Visit | Attending: Family | Admitting: Family

## 2023-01-27 ENCOUNTER — Encounter: Payer: Self-pay | Admitting: Family

## 2023-01-27 VITALS — BP 140/88 | HR 62 | Resp 16 | Wt 227.1 lb

## 2023-01-27 DIAGNOSIS — N183 Chronic kidney disease, stage 3 unspecified: Secondary | ICD-10-CM | POA: Insufficient documentation

## 2023-01-27 DIAGNOSIS — Z794 Long term (current) use of insulin: Secondary | ICD-10-CM | POA: Insufficient documentation

## 2023-01-27 DIAGNOSIS — R0602 Shortness of breath: Secondary | ICD-10-CM | POA: Insufficient documentation

## 2023-01-27 DIAGNOSIS — I10 Essential (primary) hypertension: Secondary | ICD-10-CM

## 2023-01-27 DIAGNOSIS — Z79899 Other long term (current) drug therapy: Secondary | ICD-10-CM | POA: Insufficient documentation

## 2023-01-27 DIAGNOSIS — N1832 Chronic kidney disease, stage 3b: Secondary | ICD-10-CM

## 2023-01-27 DIAGNOSIS — I130 Hypertensive heart and chronic kidney disease with heart failure and stage 1 through stage 4 chronic kidney disease, or unspecified chronic kidney disease: Secondary | ICD-10-CM | POA: Insufficient documentation

## 2023-01-27 DIAGNOSIS — I5022 Chronic systolic (congestive) heart failure: Secondary | ICD-10-CM | POA: Insufficient documentation

## 2023-01-27 DIAGNOSIS — Z951 Presence of aortocoronary bypass graft: Secondary | ICD-10-CM | POA: Insufficient documentation

## 2023-01-27 DIAGNOSIS — R5383 Other fatigue: Secondary | ICD-10-CM | POA: Insufficient documentation

## 2023-01-27 DIAGNOSIS — Z950 Presence of cardiac pacemaker: Secondary | ICD-10-CM | POA: Insufficient documentation

## 2023-01-27 DIAGNOSIS — I890 Lymphedema, not elsewhere classified: Secondary | ICD-10-CM

## 2023-01-27 DIAGNOSIS — Z87891 Personal history of nicotine dependence: Secondary | ICD-10-CM | POA: Insufficient documentation

## 2023-01-27 DIAGNOSIS — Z7902 Long term (current) use of antithrombotics/antiplatelets: Secondary | ICD-10-CM | POA: Insufficient documentation

## 2023-01-27 DIAGNOSIS — I4891 Unspecified atrial fibrillation: Secondary | ICD-10-CM

## 2023-01-27 DIAGNOSIS — I2510 Atherosclerotic heart disease of native coronary artery without angina pectoris: Secondary | ICD-10-CM | POA: Insufficient documentation

## 2023-01-27 DIAGNOSIS — E1122 Type 2 diabetes mellitus with diabetic chronic kidney disease: Secondary | ICD-10-CM

## 2023-01-27 LAB — BASIC METABOLIC PANEL
Anion gap: 11 (ref 5–15)
BUN: 30 mg/dL — ABNORMAL HIGH (ref 8–23)
CO2: 26 mmol/L (ref 22–32)
Calcium: 8.2 mg/dL — ABNORMAL LOW (ref 8.9–10.3)
Chloride: 102 mmol/L (ref 98–111)
Creatinine, Ser: 1.94 mg/dL — ABNORMAL HIGH (ref 0.61–1.24)
GFR, Estimated: 33 mL/min — ABNORMAL LOW (ref >60)
Glucose, Bld: 169 mg/dL — ABNORMAL HIGH (ref 70–99)
Potassium: 4 mmol/L (ref 3.5–5.1)
Sodium: 139 mmol/L (ref 135–145)

## 2023-01-27 NOTE — Progress Notes (Signed)
Patient ID: [name], male    DOB: 1935/05/30, 88 y.o.   MRN: [id number]  HPI  [name] is a 88 y/o male with a history of heart block, CAD, DM, hyperlipidemia, HTN, pacemaker implantation, CKD, previous tobacco use and chronic heart failure.   Echo 12/16/22: EF of 30-35% along with moderate LVH, mild LAE and trivial Duane. Echo 01/05/22: EF of 30-35% along with mild LVH. Echo 08/27/21: EF of 35-40% along with mild LAE.   RHC/LHC 08/31/21: 1.  Severe underlying three-vessel coronary artery disease with patent grafts including LIMA to LAD, SVG to large diagonal, sequential SVG to OM /distal left circumflex and SVG to right PDA. 2.  Right heart catheterization showed normal right and left-sided filling pressures, minimal pulmonary hypertension and normal cardiac output.  Admitted 12/15/22 due to chest pain and DOE due to a/c heart failure. CTA negative for PE but showed small bilateral pleural effusions. IV diuresis.   He presents today for a HF follow-up visit with a chief complaint of minimal SOB w/ moderate exertion. Chronic in nature although has been feeling better over the last couple of weeks. Has associated fatigue and pedal edema (improving) along with this. Denies any difficulty sleeping, abdominal distention, palpitations, chest pain, dizziness, cough or weight gain.   Past Medical History:  Diagnosis Date   AV block, Mobitz 1    CAD (coronary artery disease)    a. 08/2019 VF arrest-->sev 3vd on cath-->CABGx5 (LIMA->LAD, VG->OM->LCX, VG->RPDA, VG->Diag); Herring. 08/2021 Cath: sev native multivessel dzs w/ 5/5 patent grafts. Nl filling pressures->Med rx.   CKD (chronic kidney disease), stage III (HCC)    Diabetes mellitus without complication ([name])    HFrEF (heart failure with reduced ejection fraction) ([name])    a. 08/2020 Echo: EF 40-45%; Herring. 11/2020 Echo: EF 55%, no rwma, mild LVH, Gr2 DD. nl RV fxn; c. 08/2021 Echo: EF 35-40%, glob HK w/ ant/antsept/apical HK. Reduced RV fxn. Mildly dil  LA; d. 10/2021 s/p CRT-P; e. 12/2021 Echo: EF 30-35%, glob HK, GrIII DD. Mod red RV fxn, mild LAE, mild Duane, AoV sclerosis.   Hyperlipidemia LDL goal <70    Hypertension    Intermittent Complete heart block ([place])    a. 08/2021 noted on Zio; Herring. 10/2021 s/p Abbott Allure RF CRT-P ([name] # [id number]).   Ischemic cardiomyopathy    a. 08/2020 Echo: EF 40-45%; Herring. 11/2020 Echo: EF 55%; c. 08/2021 Echo: EF 35-40%; d. 10/2021 s/p CRT-P; e. 12/2021 Echo: EF 30-35%, glob HK, GrIII DD.   Trifascicular block    Past Surgical History:  Procedure Laterality Date   BACK SURGERY     CARDIAC CATHETERIZATION     CLIPPING OF ATRIAL APPENDAGE N/A 08/29/2020   Procedure: CLIPPING OF ATRIAL APPENDAGE USING ATRICURE 39 MM ATRICLIP FLEX-V;  Surgeon: Duane Isaac, MD;  Location: [place];  Service: Open Heart Surgery;  Laterality: N/A;   CORONARY ARTERY BYPASS GRAFT N/A 08/29/2020   Procedure: CORONARY ARTERY BYPASS GRAFTING (CABG) X 5 USING LEFT INTERNAL MAMMARY ARTERY AND ENDOSCOPICALLY HARVESTED RIGHT GREATER SAPHENOUS VEIN. LIMA TO LAD, SVG TO OM SEQ TO CIRC, SVG TO PD, SVG TO DIAG.;  Surgeon: Duane Isaac, MD;  Location: [place];  Service: Open Heart Surgery;  Laterality: N/A;   ENDOVEIN HARVEST OF GREATER SAPHENOUS VEIN Right 08/29/2020   Procedure: ENDOVEIN HARVEST OF GREATER SAPHENOUS VEIN;  Surgeon: Duane Isaac, MD;  Location: [place];  Service: Open Heart Surgery;  Laterality: Right;   HERNIA REPAIR  LEFT HEART CATH AND CORONARY ANGIOGRAPHY N/A 08/25/2020   Procedure: LEFT HEART CATH AND CORONARY ANGIOGRAPHY;  Surgeon: Duane Hampshire, MD;  Location: [place] [facility];  Service: Cardiovascular;  Laterality: N/A;   PACEMAKER IMPLANT N/A 10/12/2021   Procedure: PACEMAKER IMPLANT;  Surgeon: Duane Epley, MD;  Location: [place] [facility];  Service: Cardiovascular;  Laterality: N/A;   RIGHT/LEFT HEART CATH AND CORONARY ANGIOGRAPHY N/A 08/31/2021   Procedure: RIGHT/LEFT HEART CATH AND CORONARY  ANGIOGRAPHY;  Surgeon: Duane Hampshire, MD;  Location: [place] [facility];  Service: Cardiovascular;  Laterality: N/A;   TEE WITHOUT CARDIOVERSION N/A 08/29/2020   Procedure: TRANSESOPHAGEAL ECHOCARDIOGRAM (TEE);  Surgeon: Duane Isaac, MD;  Location: [place];  Service: Open Heart Surgery;  Laterality: N/A;    Family History  Problem Relation Age of Onset   Heart attack Mother    Social History   Tobacco Use   Smoking status: Former    Types: Cigarettes    Quit date: 07/26/1996    Years since quitting: 26.5   Smokeless tobacco: Never  Substance Use Topics   Alcohol use: Not Currently   Allergies  Allergen Reactions   Angiotensin Receptor Blockers     hyperkalemia   Metformin Diarrhea   Spironolactone     Hyperkalemia    Prior to Admission medications   Medication Sig Start Date End Date Taking? Authorizing Provider  acetaminophen (TYLENOL) 500 MG tablet Take 1,000 mg by mouth every 6 (six) hours as needed for mild pain, fever or headache.   Yes [provider]  acyclovir (ZOVIRAX) 800 MG tablet Take 800 mg by mouth 5 (five) times daily.   Yes [provider]  albuterol (VENTOLIN HFA) 108 (90 Base) MCG/ACT inhaler Inhale 2 puffs into the lungs every 6 (six) hours as needed for wheezing or shortness of breath. 09/30/21  Yes [provider]  aspirin EC 81 MG tablet Take 81 mg by mouth daily. Swallow whole.   Yes [provider]  carvedilol (COREG) 6.25 MG tablet Take 1 tablet (6.25 mg total) by mouth 2 (two) times daily with a meal. 11/17/22 11/12/23 Yes Theora Gianotti, NP  clopidogrel (PLAVIX) 75 MG tablet TAKE 1 TABLET BY MOUTH EVERY DAY 05/17/21  Yes Duane Dubonnet, NP  dapagliflozin propanediol (FARXIGA) 10 MG TABS tablet Take 10 mg by mouth daily.   Yes [provider]  fluticasone (FLOVENT HFA) 110 MCG/ACT inhaler Inhale 1 puff into the lungs daily.   Yes [provider]  hydrALAZINE (APRESOLINE) 25 MG  tablet Take 25 mg by mouth 2 times daily at 12 noon and 4 pm.   Yes [provider]  insulin detemir (LEVEMIR) 100 UNIT/ML injection Inject 0.15 mLs (15 Units total) into the skin at bedtime. 10/30/21  Yes Duane Bi, MD  isosorbide mononitrate (IMDUR) 30 MG 24 hr tablet Take 1 tablet (30 mg total) by mouth 2 (two) times daily. 12/19/22  Yes Wieting, Richard, MD  magnesium oxide (MAG-OX) 400 MG tablet Take 1 tablet (400 mg total) by mouth daily. 01/20/22  Yes Duane Epley, MD  pantoprazole (PROTONIX) 40 MG tablet Take 40 mg by mouth 2 (two) times daily.   Yes [provider]  polyethylene glycol (MIRALAX / GLYCOLAX) 17 g packet Take 17 g by mouth daily as needed for moderate constipation. 12/19/22  Yes Wieting, Richard, MD  Potassium Chloride ER 20 MEQ TBCR Take 20 mEq by mouth daily.   Yes [provider]  rosuvastatin (CRESTOR) 40 MG tablet TAKE 1 TABLET BY MOUTH EVERY DAY 12/14/21  Yes Arida, Mertie Clause, MD  sacubitril-valsartan (ENTRESTO) 97-103 MG Take 1 tablet by mouth 2 (two) times daily. 12/31/22  Yes Darylene Price A, FNP  torsemide (DEMADEX) 20 MG tablet Take 1 tablet (20 mg total) by mouth daily. 12/21/22  Yes Alisa Graff, FNP    Review of Systems  Constitutional:  Positive for fatigue. Negative for appetite change.  HENT:  Negative for congestion, postnasal drip and sneezing.   Eyes: Negative.   Respiratory:  Positive for shortness of breath ("very little"). Negative for cough and chest tightness.   Cardiovascular:  Positive for leg swelling ("improving"). Negative for chest pain and palpitations.  Gastrointestinal:  Negative for abdominal distention and abdominal pain.  Endocrine: Negative.   Genitourinary: Negative.   Musculoskeletal:  Negative for back pain and neck pain.  Skin: Negative.   Allergic/Immunologic: Negative.   Neurological:  Negative for dizziness and light-headedness.  Hematological:  Negative for adenopathy. Does not bruise/bleed  easily.  Psychiatric/Behavioral:  Negative for dysphoric mood and sleep disturbance (sleeping on 1 pillows). The patient is not nervous/anxious.    Vitals:   01/27/23 1119  BP: (!) 140/88  Pulse: 62  Resp: 16  SpO2: 100%  Weight: 227 lb 2 oz (103 kg)   Wt Readings from Last 3 Encounters:  01/27/23 227 lb 2 oz (103 kg)  12/30/22 223 lb (101.2 kg)  12/21/22 215 lb 8 oz (97.8 kg)   Lab Results  Component Value Date   CREATININE 1.75 (H) 12/30/2022   CREATININE 2.29 (H) 12/21/2022   CREATININE 1.90 (H) 12/19/2022   Physical Exam Vitals and nursing note reviewed.  Constitutional:      Appearance: He is well-developed.  HENT:     Head: Normocephalic and atraumatic.  Neck:     Vascular: No JVD.  Cardiovascular:     Rate and Rhythm: Normal rate and regular rhythm.  Pulmonary:     Effort: Pulmonary effort is normal. No tachypnea or accessory muscle usage.     Breath sounds: Wheezing (expiratory throughout) present.  Abdominal:     Palpations: Abdomen is soft.     Tenderness: There is no abdominal tenderness.  Musculoskeletal:        General: No tenderness.     Cervical back: Neck supple.     Right lower leg: No tenderness. Edema (1+ pitting) present.     Left lower leg: No tenderness. Edema (1+ pitting) present.  Skin:    General: Skin is warm and dry.  Neurological:     General: No focal deficit present.     Mental Status: He is alert and oriented to person, place, and time.  Psychiatric:        Mood and Affect: Mood normal.        Behavior: Behavior normal.   Assessment & Plan:  1: Chronic heart failure with reduced ejection fraction- - NYHA class II - euvolemic today - weighing daily; reminded to call for an overnight weight gain of >2 pounds or a weekly weight gain of > 5 pounds - weight up 4 pounds from from last visit here 1 month ago - echo 12/16/22: EF of 30-35% along with moderate LVH, mild LAE and trivial Duane. Echo 01/05/22: EF of 30-35% along with mild LVH.  Echo 08/27/21: EF of 35-40% along with mild LAE.  - RHC/LHC 08/31/21: 1.  Severe underlying three-vessel coronary artery disease with patent grafts including LIMA to  LAD, SVG to large diagonal, sequential SVG to OM /distal left circumflex and SVG to right PDA. 2.  Right heart catheterization showed normal right and left-sided filling pressures, minimal pulmonary hypertension and normal cardiac output. - not adding salt and he says that his wife doesn't cook with salt either - carvedilol 6.25mg  BID - farxiga 10mg  daily - entresto 97/103mg  BID - torsemide 20mg  daily - potassium 62meq daily - hydralazine 25mg  BID/ isosorbide MN 30mg  BID - BMP today  - had hyperkalemia with spironolactone - BNP 12/16/22 was 3584.8 - CRT-P 10/2021 for [facility] - Pharm D reconciled meds w/ patient  2: HTN with CKD- - BP 140/88 - saw PCP 10/19/22; returns 01/24/23 - BMP 12/30/22 reviewed and showed sodium 140, potassium 3.9, creatinine 1.75 and GFR 38  3: Type 2 DM- - A1c 12/15/22 was 7.9% - levemir insulin at bedtime  4: CAD- - saw cardiology Sharolyn Douglas) 11/17/22; returns 02/2023 - clopidogrel 75mg  daily - crestor 40mg  daily  5: Lymphedema-  - stage 2 - tries to elevate legs when sitting for long periods of time - he says that he has compression socks ordered and he's waiting for them to arrive  6: Atrial fibrillation- - goes to AF clinic next month   Medication bottles reviewed.  Return in 4 months, sooner if needed.

## 2023-01-31 ENCOUNTER — Other Ambulatory Visit: Payer: Self-pay | Admitting: Family

## 2023-02-08 ENCOUNTER — Ambulatory Visit (HOSPITAL_COMMUNITY)
Admission: RE | Admit: 2023-02-08 | Discharge: 2023-02-08 | Disposition: A | Discharge status: Home / Self Care | Payer: Medicare HMO | Source: Ambulatory Visit | Attending: Internal Medicine | Admitting: Internal Medicine

## 2023-02-08 VITALS — BP 180/92 | HR 75 | Ht 68.0 in | Wt 225.8 lb

## 2023-02-08 DIAGNOSIS — I110 Hypertensive heart disease with heart failure: Secondary | ICD-10-CM | POA: Insufficient documentation

## 2023-02-08 DIAGNOSIS — Z7901 Long term (current) use of anticoagulants: Secondary | ICD-10-CM | POA: Insufficient documentation

## 2023-02-08 DIAGNOSIS — I5022 Chronic systolic (congestive) heart failure: Secondary | ICD-10-CM | POA: Diagnosis not present

## 2023-02-08 DIAGNOSIS — D6869 Other thrombophilia: Secondary | ICD-10-CM | POA: Insufficient documentation

## 2023-02-08 DIAGNOSIS — I480 Paroxysmal atrial fibrillation: Secondary | ICD-10-CM | POA: Diagnosis present

## 2023-02-08 MED ORDER — APIXABAN 2.5 MG PO TABS: 2.5000 mg | ORAL_TABLET | Freq: Two times a day (BID) | ORAL | 6 refills | Status: DC

## 2023-02-08 NOTE — Addendum Note (Signed)
Encounter addended by: Enid Derry, CMA on: 02/08/2023 4:10 PM  Actions taken: Order list changed

## 2023-02-08 NOTE — Addendum Note (Signed)
Encounter addended by: Donnamae Jude, PA-C on: 02/08/2023 3:01 PM  Actions taken: Clinical Note Signed

## 2023-02-08 NOTE — Progress Notes (Addendum)
Primary Care Physician: Tomasita Morrow, MD Primary Cardiologist: Dr. Fletcher Anon Primary Electrophysiologist: Dr. Quentin Ore Referring Physician:    Emillio Kleinberg is a 88 y.o. male with a history significant for VF arrest in the setting of ACS, CAD status post CABG x5 in October 2021, ischemic cardiomyopathy, DM2, hypertension, high-grade heart block status post CRT-P, CKD stage IIIb, and recent admission 2/7-11/24 for acute on chronic systolic CHF exacerbation presents for consultation in the [facility]. The patient was initially diagnosed with atrial fibrillation via remote device check 01/14/23 showing AF episodes with longest being 22 minutes. Currently taking plavix due to extensive CAD history. Patient has a CHADS2VASC score of 6.  On evaluation today, he is doing okay overall. He did not know the specific reason he was here today in clinic. He does not feel anything differently when he was in Afib. No worsening shortness of breath or lower extremity edema. He is currently not on anticoagulation.   He is compliant with his ASA and plavix daily. No bleeding concerns.  Today, he denies symptoms of palpitations, chest pain, shortness of breath, orthopnea, PND, lower extremity edema, dizziness, presyncope, syncope, snoring, daytime somnolence, bleeding, or neurologic sequela. The patient is tolerating medications without difficulties and is otherwise without complaint today.    Atrial Fibrillation Risk Factors:  he does not have symptoms or diagnosis of sleep apnea. he does not have a history of rheumatic fever. he does not have a history of alcohol use. The patient does not have a history of early familial atrial fibrillation or other arrhythmias.  he has a BMI of Body mass index is 34.33 kg/m.Marland Kitchen Filed Weights   02/08/23 1139  Weight: 102.4 kg    Family History  Problem Relation Age of Onset   Heart attack Mother      Atrial Fibrillation Management  history:  Previous antiarrhythmic drugs: None Previous cardioversions: None Previous ablations: None Anticoagulation history: None   Past Medical History:  Diagnosis Date   AV block, Mobitz 1    CAD (coronary artery disease)    a. 08/2019 VF arrest-->sev 3vd on cath-->CABGx5 (LIMA->LAD, VG->OM->LCX, VG->RPDA, VG->Diag); b. 08/2021 Cath: sev native multivessel dzs w/ 5/5 patent grafts. Nl filling pressures->Med rx.   CKD (chronic kidney disease), stage III (HCC)    Diabetes mellitus without complication ([place])    HFrEF (heart failure with reduced ejection fraction) ([place])    a. 08/2020 Echo: EF 40-45%; b. 11/2020 Echo: EF 55%, no rwma, mild LVH, Gr2 DD. nl RV fxn; c. 08/2021 Echo: EF 35-40%, glob HK w/ ant/antsept/apical HK. Reduced RV fxn. Mildly dil LA; d. 10/2021 s/p CRT-P; e. 12/2021 Echo: EF 30-35%, glob HK, GrIII DD. Mod red RV fxn, mild LAE, mild MR, AoV sclerosis.   Hyperlipidemia LDL goal <70    Hypertension    Intermittent Complete heart block ([place])    a. 08/2021 noted on Zio; b. 10/2021 s/p Abbott Allure RF CRT-P (Ser # [id number]).   Ischemic cardiomyopathy    a. 08/2020 Echo: EF 40-45%; b. 11/2020 Echo: EF 55%; c. 08/2021 Echo: EF 35-40%; d. 10/2021 s/p CRT-P; e. 12/2021 Echo: EF 30-35%, glob HK, GrIII DD.   Trifascicular block    Past Surgical History:  Procedure Laterality Date   BACK SURGERY     CARDIAC CATHETERIZATION     CLIPPING OF ATRIAL APPENDAGE N/A 08/29/2020   Procedure: CLIPPING OF ATRIAL APPENDAGE USING ATRICURE 89 MM ATRICLIP FLEX-V;  Surgeon: Grace Isaac, MD;  Location: [facility]  OR;  Service: Open Heart Surgery;  Laterality: N/A;   CORONARY ARTERY BYPASS GRAFT N/A 08/29/2020   Procedure: CORONARY ARTERY BYPASS GRAFTING (CABG) X 5 USING LEFT INTERNAL MAMMARY ARTERY AND ENDOSCOPICALLY HARVESTED RIGHT GREATER SAPHENOUS VEIN. LIMA TO LAD, SVG TO OM SEQ TO CIRC, SVG TO PD, SVG TO DIAG.;  Surgeon: Grace Isaac, MD;  Location: [facility];  Service: Open Heart Surgery;   Laterality: N/A;   ENDOVEIN HARVEST OF GREATER SAPHENOUS VEIN Right 08/29/2020   Procedure: ENDOVEIN HARVEST OF GREATER SAPHENOUS VEIN;  Surgeon: Grace Isaac, MD;  Location: [place];  Service: Open Heart Surgery;  Laterality: Right;   HERNIA REPAIR     LEFT HEART CATH AND CORONARY ANGIOGRAPHY N/A 08/25/2020   Procedure: LEFT HEART CATH AND CORONARY ANGIOGRAPHY;  Surgeon: [name], MD;  Location: [facility];  Service: Cardiovascular;  Laterality: N/A;   PACEMAKER IMPLANT N/A 10/12/2021   Procedure: PACEMAKER IMPLANT;  Surgeon: Vickie Epley, MD;  Location: [facility];  Service: Cardiovascular;  Laterality: N/A;   RIGHT/LEFT HEART CATH AND CORONARY ANGIOGRAPHY N/A 08/31/2021   Procedure: RIGHT/LEFT HEART CATH AND CORONARY ANGIOGRAPHY;  Surgeon: [name], MD;  Location: [facility];  Service: Cardiovascular;  Laterality: N/A;   TEE WITHOUT CARDIOVERSION N/A 08/29/2020   Procedure: TRANSESOPHAGEAL ECHOCARDIOGRAM (TEE);  Surgeon: Grace Isaac, MD;  Location: [facility];  Service: Open Heart Surgery;  Laterality: N/A;    Current Outpatient Medications  Medication Sig Dispense Refill   acetaminophen (TYLENOL) 500 MG tablet Take 1,000 mg by mouth every 6 (six) hours as needed for mild pain, fever or headache.     acyclovir (ZOVIRAX) 800 MG tablet Take 1 tablet by mouth daily.     aspirin EC 81 MG tablet Take 81 mg by mouth daily. Swallow whole.     insulin degludec (TRESIBA) 100 UNIT/ML FlexTouch Pen Inject 0.15 mLs into the skin at bedtime.     metolazone (ZAROXOLYN) 2.5 MG tablet Take 2.5 mg by mouth daily.     albuterol (VENTOLIN HFA) 108 (90 Base) MCG/ACT inhaler Inhale 2 puffs into the lungs every 6 (six) hours as needed for wheezing or shortness of breath.     carvedilol (COREG) 6.25 MG tablet Take 1 tablet (6.25 mg total) by mouth 2 (two) times daily with a meal. 180 tablet 3   clopidogrel (PLAVIX) 75 MG tablet TAKE 1 TABLET BY MOUTH EVERY  DAY 90 tablet 1   dapagliflozin propanediol (FARXIGA) 10 MG TABS tablet Take 10 mg by mouth daily.     fluticasone (FLOVENT HFA) 110 MCG/ACT inhaler Inhale 1 puff into the lungs daily.     hydrALAZINE (APRESOLINE) 25 MG tablet Take 25 mg by mouth 2 times daily at 12 noon and 4 pm.     insulin detemir (LEVEMIR) 100 UNIT/ML injection Inject 0.15 mLs (15 Units total) into the skin at bedtime. 13.5 mL 0   isosorbide mononitrate (IMDUR) 30 MG 24 hr tablet TAKE 1 TABLET BY MOUTH EVERY DAY 90 tablet 3   magnesium oxide (MAG-OX) 400 MG tablet Take 1 tablet (400 mg total) by mouth daily. 90 tablet 1   pantoprazole (PROTONIX) 40 MG tablet Take 40 mg by mouth 2 (two) times daily.     polyethylene glycol (MIRALAX / GLYCOLAX) 17 g packet Take 17 g by mouth daily as needed for moderate constipation. 30 each 0   Potassium Chloride ER 20 MEQ TBCR Take 20 mEq by mouth  daily.     rosuvastatin (CRESTOR) 40 MG tablet TAKE 1 TABLET BY MOUTH EVERY DAY 90 tablet 0   sacubitril-valsartan (ENTRESTO) 97-103 MG Take 1 tablet by mouth 2 (two) times daily. 60 tablet 6   torsemide (DEMADEX) 20 MG tablet Take 1 tablet (20 mg total) by mouth daily. 30 tablet 0   No current facility-administered medications for this encounter.    Allergies  Allergen Reactions   Angiotensin Receptor Blockers     hyperkalemia   Metformin Diarrhea   Spironolactone     Hyperkalemia     Social History   Socioeconomic History   Marital status: Married    Spouse name: Not on file   Number of children: Not on file   Years of education: Not on file   Highest education level: Not on file  Occupational History   Not on file  Tobacco Use   Smoking status: Former    Types: Cigarettes    Quit date: 07/26/1996    Years since quitting: 26.5   Smokeless tobacco: Never  Vaping Use   Vaping Use: Never used  Substance and Sexual Activity   Alcohol use: Not Currently   Drug use: Never   Sexual activity: Yes  Other Topics Concern   Not on  file  Social History Narrative   Not on file   Social Determinants of Health   Financial Resource Strain: Low Risk  (07/27/2019)   Overall Financial Resource Strain (CARDIA)    Difficulty of Paying Living Expenses: Not hard at all  Food Insecurity: No Food Insecurity (12/16/2022)   Hunger Vital Sign    Worried About Running Out of Food in the Last Year: Never true    [place] in the Last Year: Never true  Transportation Needs: No Transportation Needs (12/16/2022)   PRAPARE - Hydrologist (Medical): No    Lack of Transportation (Non-Medical): No  Physical Activity: Inactive (07/27/2019)   Exercise Vital Sign    Days of Exercise per Week: 0 days    Minutes of Exercise per Session: 0 min  Stress: No Stress Concern Present (07/27/2019)   [place]    Feeling of Stress : Not at all  Social Connections: Unknown (07/27/2019)   Social Connection and Isolation Panel [NHANES]    Frequency of Communication with Friends and Family: Patient declined    Frequency of Social Gatherings with Friends and Family: Patient declined    Attends Religious Services: Patient declined    Marine scientist or Organizations: Patient declined    Attends Archivist Meetings: Patient declined    Marital Status: Patient declined  Intimate Partner Violence: Not At Risk (12/16/2022)   Humiliation, Afraid, Rape, and Kick questionnaire    Fear of Current or Ex-Partner: No    Emotionally Abused: No    Physically Abused: No    Sexually Abused: No     ROS- All systems are reviewed and negative except as per the HPI above.  Physical Exam: Vitals:   02/08/23 1139  Weight: 102.4 kg  Height: 5\' 8"  (1.727 m)    GEN- The patient is a well appearing male, alert and oriented x 3 today.   Head- normocephalic, atraumatic Eyes-  Sclera clear, conjunctiva pink Ears- hearing intact Oropharynx- clear Neck-  supple  Lungs- Clear to ausculation bilaterally, normal work of breathing Heart- Regular rate and rhythm, no murmurs, rubs or gallops  GI-  soft, NT, ND, + BS Extremities- no clubbing, cyanosis, or edema MS- no significant deformity or atrophy Skin- no rash or lesion Psych- euthymic mood, full affect Neuro- strength and sensation are intact  Wt Readings from Last 3 Encounters:  02/08/23 102.4 kg  01/27/23 103 kg  12/30/22 101.2 kg    EKG today demonstrates AV dual paced rhythm HR 75 PR 28 ms QRS 114 ms QT/Qtc 456/509 ms  Echo 12/16/22 demonstrated: 1. Left ventricular ejection fraction, by estimation, is 30 to 35%. The  left ventricle has moderately decreased function. The left ventricle  demonstrates global hypokinesis with severe hypokinesis of the anterior  wall. The left ventricular internal  cavity size was mildly dilated. There is moderate asymmetric left  ventricular hypertrophy of the septal segment.   2. Right ventricle is not well visualized. Right ventricular systolic  function is grossly normal. The right ventricular size is normal.  Tricuspid regurgitation signal is inadequate for assessing PA pressure.   3. Left atrial size was mildly dilated.   4. The mitral valve is normal in structure. Trivial mitral valve  regurgitation. No evidence of mitral stenosis.   5. The aortic valve is normal in structure. Aortic valve regurgitation is  not visualized. Aortic valve sclerosis is present, with no evidence of  aortic valve stenosis.   6. The inferior vena cava is normal in size with greater than 50%  respiratory variability, suggesting right atrial pressure of 3 mmHg.  Epic records are reviewed at length today.  CHA2DS2-VASc Score = 6  The patient's score is based upon: CHF History: 1 HTN History: 1 Diabetes History: 1 Stroke History: 0 Vascular Disease History: 1 Age Score: 2 Gender Score: 0       ASSESSMENT AND PLAN: Paroxysmal Atrial Fibrillation (ICD10:   I48.0) The patient's CHA2DS2-VASc score is 6, indicating a 9.7% annual risk of stroke.    Education provided about Afib with visual diagram. Discussion about the discovery of his Afib via remote device check on 01/14/23. His overall burden is low and will require monitoring. After discussion, we will proceed with conservative observation at this time.  2. Secondary hypercoagulable state secondary to paroxysmal atrial fibrillation Discussion with patient regarding risks vs benefits of anticoagulation for risk of stroke secondary to atrial fibrillation.  After discussion, patient would be interested in beginning anticoagulation. We would start Eliquis 2.5 mg BID and likely discontinue one of his antiplatelet medications. He is eligible for lower dose of Eliquis due to age and creatinine 1.94 on 01/27/23.  Discussion with primary cardiologist regarding indication for anticoagulation in the setting of new finding of atrial fibrillation. After discussion, agreed it is reasonable to proceed with the following plan:  Discontinue ASA. Discontinue Plavix. Begin Eliquis 2.5 mg BID   We will schedule 1 month f/u for CBC and Bmet.   3. Chronic systolic CHF The importance of adequate treatment of sleep apnea was discussed today in order to improve our ability to maintain sinus rhythm long term. He appears euvolemic today with no worsening of shortness of breath compared to his baseline.  5. HTN Significantly elevated at vital intake but improved upon retake. Recommend to trend at home.  Addendum: repeat BP 160/90.  Follow up in 1 month for bloodwork and reassess Afib burden.   Emily Filbert, PA-C [facility] [address] [place], [place] [address] [phone] 02/08/2023 11:48 AM

## 2023-02-09 NOTE — Progress Notes (Signed)
Patient ID: [name], male   DOB: [birth year], 88 y.o.   MRN: [id number]  [place] - PHARMACIST COUNSELING NOTE  *HFrEF*  Guideline-Directed Medical Therapy/Evidence Based Medicine  ACE/ARB/ARNI: Delene Loll 49/51mg  twice daily,  hydralazine/isosorbide Beta Blocker: carvedilol 6.25mg  twice daily Aldosterone Antagonist:  none- hyperkalemia in the past Diuretic: Torsemide 20mg  daily SGLT2i: Farxiga 10mg  daily  Adherence Assessment  Do you ever forget to take your medication? [] Yes [x] No  Do you ever skip doses due to side effects? [] Yes [x] No  Do you have trouble affording your medicines? [] Yes [x] No  Are you ever unable to pick up your medication due to transportation difficulties? [] Yes [x] No  Do you ever stop taking your medications because you don't believe they are helping? [] Yes [x] No  Do you check your weight daily? [x] Yes [] No   Adherence strategy: pill box filled by wife  Barriers to obtaining medications: none  Vital signs: HR 62, BP 140/88, weight (pounds) 227 lbs  ECHO: Date 08/27/21, EF 35-40%, The left ventricle has moderately decreased function. The left ventricle demonstrates global hypokinesis. Concern for anterior, anteroseptal and apical hypokinesis. The left ventricular  internal cavity size was mildly dilated      Latest Ref Rng & Units 01/27/2023   12:13 PM 12/30/2022   12:08 PM 12/21/2022   10:26 AM  BMP  Glucose 70 - 99 mg/dL 169  94  138   BUN 8 - 23 mg/dL 30  25  32   Creatinine 0.61 - 1.24 mg/dL 1.94  1.75  2.29   Sodium 135 - 145 mmol/L 139  140  139   Potassium 3.5 - 5.1 mmol/L 4.0  3.9  3.8   Chloride 98 - 111 mmol/L 102  105  100   CO2 22 - 32 mmol/L 26  25  24    Calcium 8.9 - 10.3 mg/dL 8.2  8.1  8.6     Past Medical History:  Diagnosis Date   AV block, Mobitz 1    CAD (coronary artery disease)    a. 08/2019 VF arrest-->sev 3vd on cath-->CABGx5 (LIMA->LAD, VG->OM->LCX, VG->RPDA,  VG->Diag); b. 08/2021 Cath: sev native multivessel dzs w/ 5/5 patent grafts. Nl filling pressures->Med rx.   CKD (chronic kidney disease), stage III (HCC)    Diabetes mellitus without complication ([place])    HFrEF (heart failure with reduced ejection fraction) ([place])    a. 08/2020 Echo: EF 40-45%; b. 11/2020 Echo: EF 55%, no rwma, mild LVH, Gr2 DD. nl RV fxn; c. 08/2021 Echo: EF 35-40%, glob HK w/ ant/antsept/apical HK. Reduced RV fxn. Mildly dil LA; d. 10/2021 s/p CRT-P; e. 12/2021 Echo: EF 30-35%, glob HK, GrIII DD. Mod red RV fxn, mild LAE, mild MR, AoV sclerosis.   Hyperlipidemia LDL goal <70    Hypertension    Intermittent Complete heart block ([place])    a. 08/2021 noted on Zio; b. 10/2021 s/p Abbott Allure RF CRT-P (Ser # [id number]).   Ischemic cardiomyopathy    a. 08/2020 Echo: EF 40-45%; b. 11/2020 Echo: EF 55%; c. 08/2021 Echo: EF 35-40%; d. 10/2021 s/p CRT-P; e. 12/2021 Echo: EF 30-35%, glob HK, GrIII DD.   Trifascicular block     ASSESSMENT 88 year old male who presents to the HF clinic for follow up. PMH includes CAD, HFrEF, CKD, DM, HTN, and HLD. Last acute care admission was 12/15/2022 for acute HF.   Medication reconciliation completed during OV. Patient denies problems with current therapy or issues affording medication.  PLAN (recommendation)  Continue current medication Repeat BMET today Follow up as directed by NP  Time spent: 15 minutes  Jayr Lupercio Rodriguez-Guzman PharmD, BCPS 02/09/2023 3:23 PM   Current Outpatient Medications:    acetaminophen (TYLENOL) 500 MG tablet, Take 1,000 mg by mouth every 6 (six) hours as needed for mild pain, fever or headache., Disp: , Rfl:    acyclovir (ZOVIRAX) 800 MG tablet, Take 1 tablet by mouth daily., Disp: , Rfl:    albuterol (VENTOLIN HFA) 108 (90 Base) MCG/ACT inhaler, Inhale 2 puffs into the lungs every 6 (six) hours as needed for wheezing or shortness of breath., Disp: , Rfl:    apixaban (ELIQUIS) 2.5 MG TABS tablet, Take 1 tablet (2.5 mg  total) by mouth 2 (two) times daily., Disp: 60 tablet, Rfl: 6   carvedilol (COREG) 6.25 MG tablet, Take 1 tablet (6.25 mg total) by mouth 2 (two) times daily with a meal., Disp: 180 tablet, Rfl: 3   dapagliflozin propanediol (FARXIGA) 10 MG TABS tablet, Take 10 mg by mouth daily., Disp: , Rfl:    fluticasone (FLOVENT HFA) 110 MCG/ACT inhaler, Inhale 1 puff into the lungs daily., Disp: , Rfl:    hydrALAZINE (APRESOLINE) 25 MG tablet, Take 25 mg by mouth 2 times daily at 12 noon and 4 pm., Disp: , Rfl:    insulin degludec (TRESIBA) 100 UNIT/ML FlexTouch Pen, Inject 0.15 mLs into the skin at bedtime., Disp: , Rfl:    insulin detemir (LEVEMIR) 100 UNIT/ML injection, Inject 0.15 mLs (15 Units total) into the skin at bedtime. (Patient not taking: Reported on 02/08/2023), Disp: 13.5 mL, Rfl: 0   isosorbide mononitrate (IMDUR) 30 MG 24 hr tablet, TAKE 1 TABLET BY MOUTH EVERY DAY, Disp: 90 tablet, Rfl: 3   magnesium oxide (MAG-OX) 400 MG tablet, Take 1 tablet (400 mg total) by mouth daily., Disp: 90 tablet, Rfl: 1   metolazone (ZAROXOLYN) 2.5 MG tablet, Take 2.5 mg by mouth daily., Disp: , Rfl:    pantoprazole (PROTONIX) 40 MG tablet, Take 40 mg by mouth 2 (two) times daily., Disp: , Rfl:    polyethylene glycol (MIRALAX / GLYCOLAX) 17 g packet, Take 17 g by mouth daily as needed for moderate constipation., Disp: 30 each, Rfl: 0   Potassium Chloride ER 20 MEQ TBCR, Take 20 mEq by mouth 2 (two) times daily., Disp: , Rfl:    rosuvastatin (CRESTOR) 40 MG tablet, TAKE 1 TABLET BY MOUTH EVERY DAY, Disp: 90 tablet, Rfl: 0   sacubitril-valsartan (ENTRESTO) 97-103 MG, Take 1 tablet by mouth 2 (two) times daily., Disp: 60 tablet, Rfl: 6   torsemide (DEMADEX) 20 MG tablet, Take 1 tablet (20 mg total) by mouth daily., Disp: 30 tablet, Rfl: 0   MEDICATION ADHERENCES TIPS AND STRATEGIES Taking medication as prescribed improves patient outcomes in heart failure (reduces hospitalizations, improves symptoms, increases  survival) Side effects of medications can be managed by decreasing doses, switching agents, stopping drugs, or adding additional therapy. Please let someone in the [facility] know if you have having bothersome side effects so we can modify your regimen. Do not alter your medication regimen without talking to [place].  Medication reminders can help patients remember to take drugs on time. If you are missing or forgetting doses you can try linking behaviors, using pill boxes, or an electronic reminder like an alarm on your phone or an app. Some people can also get automated phone calls as medication reminders.

## 2023-02-12 ENCOUNTER — Other Ambulatory Visit: Payer: Self-pay | Admitting: Family

## 2023-02-12 DIAGNOSIS — I5022 Chronic systolic (congestive) heart failure: Secondary | ICD-10-CM

## 2023-02-16 ENCOUNTER — Ambulatory Visit: Payer: Medicare HMO | Admitting: Nurse Practitioner

## 2023-02-16 NOTE — Progress Notes (Signed)
Remote pacemaker transmission.   
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
## 2023-03-09 ENCOUNTER — Encounter: Payer: Self-pay | Admitting: Nurse Practitioner

## 2023-03-09 ENCOUNTER — Encounter: Payer: Self-pay | Admitting: Emergency Medicine

## 2023-03-09 ENCOUNTER — Emergency Department: Payer: Medicare HMO

## 2023-03-09 ENCOUNTER — Inpatient Hospital Stay
Admission: EM | Admit: 2023-03-09 | Discharge: 2023-03-12 | DRG: 291 | Disposition: A | Discharge status: Home / Self Care | Payer: Medicare HMO | Attending: Internal Medicine | Admitting: Internal Medicine

## 2023-03-09 ENCOUNTER — Other Ambulatory Visit: Payer: Self-pay

## 2023-03-09 ENCOUNTER — Ambulatory Visit: Payer: Medicare HMO | Attending: Nurse Practitioner | Admitting: Nurse Practitioner

## 2023-03-09 VITALS — BP 162/90 | HR 69 | Ht 68.0 in | Wt 237.8 lb

## 2023-03-09 DIAGNOSIS — I420 Dilated cardiomyopathy: Secondary | ICD-10-CM | POA: Diagnosis present

## 2023-03-09 DIAGNOSIS — Z950 Presence of cardiac pacemaker: Secondary | ICD-10-CM

## 2023-03-09 DIAGNOSIS — E1165 Type 2 diabetes mellitus with hyperglycemia: Secondary | ICD-10-CM | POA: Diagnosis present

## 2023-03-09 DIAGNOSIS — E876 Hypokalemia: Secondary | ICD-10-CM | POA: Diagnosis present

## 2023-03-09 DIAGNOSIS — I130 Hypertensive heart and chronic kidney disease with heart failure and stage 1 through stage 4 chronic kidney disease, or unspecified chronic kidney disease: Secondary | ICD-10-CM | POA: Diagnosis not present

## 2023-03-09 DIAGNOSIS — I2510 Atherosclerotic heart disease of native coronary artery without angina pectoris: Secondary | ICD-10-CM | POA: Diagnosis present

## 2023-03-09 DIAGNOSIS — N1832 Chronic kidney disease, stage 3b: Secondary | ICD-10-CM

## 2023-03-09 DIAGNOSIS — I10 Essential (primary) hypertension: Secondary | ICD-10-CM | POA: Diagnosis present

## 2023-03-09 DIAGNOSIS — I472 Ventricular tachycardia, unspecified: Secondary | ICD-10-CM | POA: Diagnosis present

## 2023-03-09 DIAGNOSIS — Z794 Long term (current) use of insulin: Secondary | ICD-10-CM

## 2023-03-09 DIAGNOSIS — E1122 Type 2 diabetes mellitus with diabetic chronic kidney disease: Secondary | ICD-10-CM | POA: Diagnosis present

## 2023-03-09 DIAGNOSIS — I509 Heart failure, unspecified: Secondary | ICD-10-CM

## 2023-03-09 DIAGNOSIS — I255 Ischemic cardiomyopathy: Secondary | ICD-10-CM | POA: Diagnosis present

## 2023-03-09 DIAGNOSIS — I442 Atrioventricular block, complete: Secondary | ICD-10-CM | POA: Diagnosis present

## 2023-03-09 DIAGNOSIS — K219 Gastro-esophageal reflux disease without esophagitis: Secondary | ICD-10-CM | POA: Diagnosis present

## 2023-03-09 DIAGNOSIS — I5020 Unspecified systolic (congestive) heart failure: Secondary | ICD-10-CM | POA: Diagnosis not present

## 2023-03-09 DIAGNOSIS — I5032 Chronic diastolic (congestive) heart failure: Secondary | ICD-10-CM | POA: Diagnosis present

## 2023-03-09 DIAGNOSIS — Z8249 Family history of ischemic heart disease and other diseases of the circulatory system: Secondary | ICD-10-CM

## 2023-03-09 DIAGNOSIS — I5023 Acute on chronic systolic (congestive) heart failure: Secondary | ICD-10-CM | POA: Diagnosis present

## 2023-03-09 DIAGNOSIS — Z6836 Body mass index (BMI) 36.0-36.9, adult: Secondary | ICD-10-CM

## 2023-03-09 DIAGNOSIS — Z79899 Other long term (current) drug therapy: Secondary | ICD-10-CM | POA: Diagnosis not present

## 2023-03-09 DIAGNOSIS — E1129 Type 2 diabetes mellitus with other diabetic kidney complication: Secondary | ICD-10-CM | POA: Diagnosis present

## 2023-03-09 DIAGNOSIS — Z87891 Personal history of nicotine dependence: Secondary | ICD-10-CM

## 2023-03-09 DIAGNOSIS — E1169 Type 2 diabetes mellitus with other specified complication: Secondary | ICD-10-CM | POA: Diagnosis present

## 2023-03-09 DIAGNOSIS — Z7901 Long term (current) use of anticoagulants: Secondary | ICD-10-CM

## 2023-03-09 DIAGNOSIS — Z8674 Personal history of sudden cardiac arrest: Secondary | ICD-10-CM

## 2023-03-09 DIAGNOSIS — I25118 Atherosclerotic heart disease of native coronary artery with other forms of angina pectoris: Secondary | ICD-10-CM | POA: Diagnosis not present

## 2023-03-09 DIAGNOSIS — Z951 Presence of aortocoronary bypass graft: Secondary | ICD-10-CM

## 2023-03-09 DIAGNOSIS — E669 Obesity, unspecified: Secondary | ICD-10-CM | POA: Diagnosis present

## 2023-03-09 DIAGNOSIS — I480 Paroxysmal atrial fibrillation: Secondary | ICD-10-CM

## 2023-03-09 DIAGNOSIS — E785 Hyperlipidemia, unspecified: Secondary | ICD-10-CM | POA: Diagnosis present

## 2023-03-09 DIAGNOSIS — I252 Old myocardial infarction: Secondary | ICD-10-CM

## 2023-03-09 DIAGNOSIS — Z888 Allergy status to other drugs, medicaments and biological substances status: Secondary | ICD-10-CM

## 2023-03-09 DIAGNOSIS — Z7951 Long term (current) use of inhaled steroids: Secondary | ICD-10-CM

## 2023-03-09 DIAGNOSIS — N184 Chronic kidney disease, stage 4 (severe): Secondary | ICD-10-CM | POA: Diagnosis present

## 2023-03-09 DIAGNOSIS — I5043 Acute on chronic combined systolic (congestive) and diastolic (congestive) heart failure: Secondary | ICD-10-CM | POA: Diagnosis not present

## 2023-03-09 LAB — CBC
HCT: 41.4 % (ref 39.0–52.0)
Hemoglobin: 12.7 g/dL — ABNORMAL LOW (ref 13.0–17.0)
MCH: 26.9 pg (ref 26.0–34.0)
MCHC: 30.7 g/dL (ref 30.0–36.0)
MCV: 87.7 fL (ref 80.0–100.0)
Platelets: 136 10*3/uL — ABNORMAL LOW (ref 150–400)
RBC: 4.72 MIL/uL (ref 4.22–5.81)
RDW: 15.2 % (ref 11.5–15.5)
WBC: 3.6 10*3/uL — ABNORMAL LOW (ref 4.0–10.5)
nRBC: 0 % (ref 0.0–0.2)

## 2023-03-09 LAB — BASIC METABOLIC PANEL
Anion gap: 11 (ref 5–15)
BUN: 28 mg/dL — ABNORMAL HIGH (ref 8–23)
CO2: 22 mmol/L (ref 22–32)
Calcium: 7.3 mg/dL — ABNORMAL LOW (ref 8.9–10.3)
Chloride: 106 mmol/L (ref 98–111)
Creatinine, Ser: 2.01 mg/dL — ABNORMAL HIGH (ref 0.61–1.24)
GFR, Estimated: 32 mL/min — ABNORMAL LOW (ref >60)
Glucose, Bld: 223 mg/dL — ABNORMAL HIGH (ref 70–99)
Potassium: 2.9 mmol/L — ABNORMAL LOW (ref 3.5–5.1)
Sodium: 139 mmol/L (ref 135–145)

## 2023-03-09 LAB — CBG MONITORING, ED
Glucose-Capillary: 117 mg/dL — ABNORMAL HIGH (ref 70–99)
Glucose-Capillary: 131 mg/dL — ABNORMAL HIGH (ref 70–99)

## 2023-03-09 LAB — TROPONIN I (HIGH SENSITIVITY)
Troponin I (High Sensitivity): 16 ng/L (ref <18)
Troponin I (High Sensitivity): 17 ng/L (ref <18)

## 2023-03-09 LAB — MAGNESIUM: Magnesium: 1.4 mg/dL — ABNORMAL LOW (ref 1.7–2.4)

## 2023-03-09 LAB — POTASSIUM: Potassium: 4.1 mmol/L (ref 3.5–5.1)

## 2023-03-09 LAB — BRAIN NATRIURETIC PEPTIDE: B Natriuretic Peptide: 3080.1 pg/mL — ABNORMAL HIGH (ref 0.0–100.0)

## 2023-03-09 MED ORDER — ACETAMINOPHEN 650 MG RE SUPP: 650.0000 mg | Freq: Four times a day (QID) | RECTAL | Status: DC | PRN

## 2023-03-09 MED ORDER — ROSUVASTATIN CALCIUM 10 MG PO TABS
40.0000 mg | ORAL_TABLET | Freq: Every day | ORAL | Status: DC
  Administered 2023-03-10 – 2023-03-11 (×2): 40 mg via ORAL
  Filled 2023-03-09: qty 2
  Filled 2023-03-09: qty 4

## 2023-03-09 MED ORDER — FUROSEMIDE 10 MG/ML IJ SOLN
80.0000 mg | Freq: Once | INTRAMUSCULAR | Status: AC
  Administered 2023-03-09: 80 mg via INTRAVENOUS
  Filled 2023-03-09: qty 8

## 2023-03-09 MED ORDER — CARVEDILOL 6.25 MG PO TABS
6.2500 mg | ORAL_TABLET | Freq: Two times a day (BID) | ORAL | Status: DC
  Administered 2023-03-09 – 2023-03-12 (×6): 6.25 mg via ORAL
  Filled 2023-03-09 (×6): qty 1

## 2023-03-09 MED ORDER — METOLAZONE 2.5 MG PO TABS
2.5000 mg | ORAL_TABLET | ORAL | Status: DC
  Administered 2023-03-09: 2.5 mg via ORAL
  Filled 2023-03-09: qty 1

## 2023-03-09 MED ORDER — DAPAGLIFLOZIN PROPANEDIOL 10 MG PO TABS
10.0000 mg | ORAL_TABLET | Freq: Every day | ORAL | Status: DC
  Administered 2023-03-10 – 2023-03-12 (×3): 10 mg via ORAL
  Filled 2023-03-09 (×3): qty 1

## 2023-03-09 MED ORDER — INSULIN GLARGINE-YFGN 100 UNIT/ML ~~LOC~~ SOLN
15.0000 [IU] | Freq: Every day | SUBCUTANEOUS | Status: DC
  Administered 2023-03-09 – 2023-03-11 (×3): 15 [IU] via SUBCUTANEOUS
  Filled 2023-03-09 (×4): qty 0.15

## 2023-03-09 MED ORDER — ONDANSETRON HCL 4 MG PO TABS: 4.0000 mg | ORAL_TABLET | Freq: Four times a day (QID) | ORAL | Status: DC | PRN

## 2023-03-09 MED ORDER — FUROSEMIDE 10 MG/ML IJ SOLN
80.0000 mg | Freq: Once | INTRAMUSCULAR | Status: DC
  Filled 2023-03-09: qty 8

## 2023-03-09 MED ORDER — POTASSIUM CHLORIDE 10 MEQ/100ML IV SOLN
10.0000 meq | INTRAVENOUS | Status: AC
  Administered 2023-03-09: 10 meq via INTRAVENOUS
  Filled 2023-03-09: qty 100

## 2023-03-09 MED ORDER — SACUBITRIL-VALSARTAN 97-103 MG PO TABS
1.0000 | ORAL_TABLET | Freq: Two times a day (BID) | ORAL | Status: DC
  Administered 2023-03-09 – 2023-03-12 (×6): 1 via ORAL
  Filled 2023-03-09 (×6): qty 1

## 2023-03-09 MED ORDER — HYDRALAZINE HCL 25 MG PO TABS
25.0000 mg | ORAL_TABLET | Freq: Two times a day (BID) | ORAL | Status: DC
  Administered 2023-03-09 – 2023-03-11 (×5): 25 mg via ORAL
  Filled 2023-03-09 (×5): qty 1

## 2023-03-09 MED ORDER — MAGNESIUM SULFATE 2 GM/50ML IV SOLN
2.0000 g | Freq: Once | INTRAVENOUS | Status: AC
  Administered 2023-03-09: 2 g via INTRAVENOUS
  Filled 2023-03-09: qty 50

## 2023-03-09 MED ORDER — POTASSIUM CHLORIDE 10 MEQ/100ML IV SOLN
INTRAVENOUS | Status: AC
  Administered 2023-03-09: 10 meq via INTRAVENOUS
  Filled 2023-03-09: qty 100

## 2023-03-09 MED ORDER — ACETAMINOPHEN 325 MG PO TABS: 650.0000 mg | ORAL_TABLET | Freq: Four times a day (QID) | ORAL | Status: DC | PRN

## 2023-03-09 MED ORDER — ONDANSETRON HCL 4 MG/2ML IJ SOLN: 4.0000 mg | Freq: Four times a day (QID) | INTRAMUSCULAR | Status: DC | PRN

## 2023-03-09 MED ORDER — INSULIN ASPART 100 UNIT/ML IJ SOLN: 0.0000 [IU] | Freq: Every day | INTRAMUSCULAR | Status: DC

## 2023-03-09 MED ORDER — POTASSIUM CHLORIDE CRYS ER 20 MEQ PO TBCR
40.0000 meq | EXTENDED_RELEASE_TABLET | Freq: Once | ORAL | Status: AC
  Administered 2023-03-09: 40 meq via ORAL
  Filled 2023-03-09: qty 2

## 2023-03-09 MED ORDER — PANTOPRAZOLE SODIUM 40 MG PO TBEC
40.0000 mg | DELAYED_RELEASE_TABLET | Freq: Two times a day (BID) | ORAL | Status: DC
  Administered 2023-03-09 – 2023-03-12 (×6): 40 mg via ORAL
  Filled 2023-03-09 (×6): qty 1

## 2023-03-09 MED ORDER — ISOSORBIDE MONONITRATE ER 30 MG PO TB24
30.0000 mg | ORAL_TABLET | Freq: Every day | ORAL | Status: DC
  Administered 2023-03-10 – 2023-03-12 (×3): 30 mg via ORAL
  Filled 2023-03-09 (×3): qty 1

## 2023-03-09 MED ORDER — INSULIN ASPART 100 UNIT/ML IJ SOLN
0.0000 [IU] | Freq: Three times a day (TID) | INTRAMUSCULAR | Status: DC
  Administered 2023-03-10: 2 [IU] via SUBCUTANEOUS
  Administered 2023-03-11: 1 [IU] via SUBCUTANEOUS
  Administered 2023-03-11: 2 [IU] via SUBCUTANEOUS
  Administered 2023-03-11: 3 [IU] via SUBCUTANEOUS
  Filled 2023-03-09 (×4): qty 1

## 2023-03-09 MED ORDER — SENNOSIDES-DOCUSATE SODIUM 8.6-50 MG PO TABS: 1.0000 | ORAL_TABLET | Freq: Every evening | ORAL | Status: DC | PRN

## 2023-03-09 MED ORDER — APIXABAN 2.5 MG PO TABS
2.5000 mg | ORAL_TABLET | Freq: Two times a day (BID) | ORAL | Status: DC
  Administered 2023-03-09 – 2023-03-12 (×6): 2.5 mg via ORAL
  Filled 2023-03-09 (×6): qty 1

## 2023-03-09 MED ORDER — POLYETHYLENE GLYCOL 3350 17 G PO PACK: 17.0000 g | PACK | Freq: Every day | ORAL | Status: DC | PRN

## 2023-03-09 MED ORDER — ALBUTEROL SULFATE (2.5 MG/3ML) 0.083% IN NEBU: 3.0000 mL | INHALATION_SOLUTION | Freq: Four times a day (QID) | RESPIRATORY_TRACT | Status: DC | PRN

## 2023-03-09 MED ORDER — ACYCLOVIR 200 MG PO CAPS
800.0000 mg | ORAL_CAPSULE | Freq: Every day | ORAL | Status: DC
  Administered 2023-03-10 – 2023-03-12 (×3): 800 mg via ORAL
  Filled 2023-03-09 (×3): qty 4

## 2023-03-09 NOTE — Hospital Course (Addendum)
Mr. 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  Of note patient has gained 20 pounds over the last 2 months.  Patient was sent from outpatient cardiology to the emergency department for diuresis.  Per EDP, cardiology will have advanced heart failure cardiologist to consult on patient.  Vitals in the ED showed temperature of 98.6, respiration rate 20, heart rate of 68, blood pressure 138/71, SpO2 of 99% on room air.  Serum sodium is 139, potassium 2.9, chloride 106, bicarb 22, BUN of 28, serum creatinine of 2.01, EGFR 32, nonfasting blood glucose 223, WBC 3.6, hemoglobin 12.7, platelets of 136.  BNP was elevated at 3080.  High sensitive troponin was 16.  ED treatment: Potassium chloride 40 mill equivalent p.o. one-time dose, and Lasix 80 mg IV one-time dose.  Upon admission, I discontinued furosemide 80 mg IV urgently and discussed with nursing via secure chat.  Nursing has confirmed that IV furosemide 80 mg has not been pushed.

## 2023-03-09 NOTE — Assessment & Plan Note (Signed)
Rosuvastatin 40 mg daily, Coreg, Imdur, metolazone, Entresto, Eliquis were resumed on admission

## 2023-03-09 NOTE — Assessment & Plan Note (Signed)
At baseline 

## 2023-03-09 NOTE — Assessment & Plan Note (Addendum)
Patient sent from outpatient cardiology clinic Strict I's and O's Epic order placed to [facility] cardiology

## 2023-03-09 NOTE — ED Triage Notes (Signed)
Pt via POV from home. Pt here for fluid retention and SOB that's been going on for a while. Pt has a hx of CHF and states that the SOB has been going on "a while." Denies any chest pain. Pt is A&OX4 and NAD.
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
## 2023-03-09 NOTE — Assessment & Plan Note (Addendum)
Home long-acting insulin 15 units nightly resumed Insulin SSI with at bedtime coverage ordered

## 2023-03-09 NOTE — Assessment & Plan Note (Signed)
-   Rosuvastatin 40 mg daily resumed 

## 2023-03-09 NOTE — Patient Instructions (Signed)
Follow-Up: At [facility], you and your health needs are our priority.  As part of our continuing mission to provide you with exceptional heart care, we have created designated Provider Care Teams.  These Care Teams include your primary Cardiologist (physician) and Advanced Practice Providers (APPs -  Physician Assistants and Nurse Practitioners) who all work together to provide you with the care you need, when you need it.  We recommend signing up for the patient portal called "MyChart".  Sign up information is provided on this After Visit Summary.  MyChart is used to connect with patients for Virtual Visits (Telemedicine).  Patients are able to view lab/test results, encounter notes, upcoming appointments, etc.  Non-urgent messages can be sent to your provider as well.   To learn more about what you can do with MyChart, go to [email or web address].    Your next appointment:   7 day(s)  Provider:   You may see Lorine Bears, MD or one of the following Advanced Practice Providers on your designated Care Team:   Nicolasa Ducking, NP  Other Instructions Please go to the ED once you have gathered your things from home

## 2023-03-09 NOTE — Assessment & Plan Note (Addendum)
Serum magnesium was 1.4 on admission Magnesium 2 g IV one-time dose ordered Repeat magnesium level in the a.m.

## 2023-03-09 NOTE — Assessment & Plan Note (Signed)
Eliquis 2.5 mg p.o. twice daily, Coreg 6.25 mg p.o. twice daily resumed on admission

## 2023-03-09 NOTE — Assessment & Plan Note (Signed)
Resumed home carvedilol 6.25 mg p.o. twice daily, hydralazine 25 mg p.o. twice daily, Entresto 97-103 p.o. twice daily, Imdur 30 mg daily, metolazone 2.5 mg daily

## 2023-03-09 NOTE — ED Provider Notes (Addendum)
[facility] Provider Note    Event Date/Time   First MD Initiated Contact with Patient 03/09/23 1244     (approximate)   History   Shortness of Breath   HPI  [name] is a 88 y.o. male with a history of heart failure, CABG x 5, CKD sent in by cardiology for shortness of breath.  Patient is apparently had nearly a 20 pound weight gain despite taking his torsemide 40 mg daily.  He becomes very short of breath with any exertion.  He denies chest pain     Physical Exam   Triage Vital Signs: ED Triage Vitals  Enc Vitals Group     BP 03/09/23 1236 134/69     Pulse Rate 03/09/23 1236 68     Resp 03/09/23 1236 20     Temp 03/09/23 1236 98.6 F (37 C)     Temp Source 03/09/23 1236 Oral     SpO2 03/09/23 1236 96 %     Weight 03/09/23 1235 108 kg (238 lb)     Height 03/09/23 1235 1.727 m (5\' 8" )     Head Circumference --      Peak Flow --      Pain Score 03/09/23 1235 5     Pain Loc --      Pain Edu? --      Excl. in GC? --     Most recent vital signs: Vitals:   03/09/23 1236 03/09/23 1300  BP: 134/69 138/71  Pulse: 68   Resp: 20   Temp: 98.6 F (37 C)   SpO2: 96% 99%     General: Awake, no distress.  CV:  Good peripheral perfusion.  Resp:  Mild tachypnea, bibasilar Rales Abd:  No distention.  Other:  2+ edema bilaterally   ED Results / Procedures / Treatments   Labs (all labs ordered are listed, but only abnormal results are displayed) Labs Reviewed  BASIC METABOLIC PANEL - Abnormal; Notable for the following components:      Result Value   Potassium 2.9 (*)    Glucose, Bld 223 (*)    BUN 28 (*)    Creatinine, Ser 2.01 (*)    Calcium 7.3 (*)    GFR, Estimated 32 (*)    All other components within normal limits  CBC - Abnormal; Notable for the following components:   WBC 3.6 (*)    Hemoglobin 12.7 (*)    Platelets 136 (*)    All other components within normal limits  BRAIN NATRIURETIC PEPTIDE  TROPONIN I (HIGH  SENSITIVITY)     EKG  ED ECG REPORT I, Jene Every, the attending physician, personally viewed and interpreted this ECG.  Date: 03/09/2023  Rhythm: ventricular paced rhythm QRS Axis: normal Intervals: Abnormal ST/T Wave abnormalities: normal Narrative Interpretation: no evidence of acute ischemia    RADIOLOGY Chest x-ray viewed interpreted by me, pulmonary vascular congestion and likely pulmonary edema    PROCEDURES:  Critical Care performed:   Procedures   MEDICATIONS ORDERED IN ED: Medications - No data to display   IMPRESSION / MDM / ASSESSMENT AND PLAN / ED COURSE  I reviewed the triage vital signs and the nursing notes. Patient's presentation is most consistent with severe exacerbation of chronic illness.  Patient with a 20 pound weight gain in the setting of CHF with reduced EF consistent with CHF exacerbation.  Reports compliance with his medications.  Patient with increased work of breathing at rest, becomes very winded  with any exertion  Sent from cardiology for admission and diuresis, [facility] cardiology will consult  Lab work reviewed, creatinine is at baseline, troponin 16  Will consult the hospitalist team for admission       FINAL CLINICAL IMPRESSION(S) / ED DIAGNOSES   Final diagnoses:  Acute on chronic congestive heart failure, unspecified heart failure type (HCC)     Rx / DC Orders   ED Discharge Orders     None        Note:  This document was prepared using Dragon voice recognition software and may include unintentional dictation errors.   Jene Every, MD 03/09/23 1338    Jene Every, MD 03/09/23 [phone]

## 2023-03-09 NOTE — Assessment & Plan Note (Signed)
Check urgent magnesium level Ordered potassium IV 10 mill equivalent, 2 doses ordered, with discussion with nursing to initiate prior to giving IV Lasix Repeat potassium scheduled for 1300, will continue to monitor Admit to telemetry cardiac, inpatient

## 2023-03-09 NOTE — H&P (Signed)
History and Physical   Dat Derksen ZOX:[id number] DOB: 05-12-1935 DOA: 03/09/2023  PCP: Idelia Salm, MD  Outpatient Specialists: Dr. Kirke Corin, [facility] cardiology Patient coming from: Outpatient cardiology clinic via POV  I have personally briefly reviewed patient's old medical records in [facility] EMR.  Chief Concern: Shortness of breath, weight gain  HPI: Mr. 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  Of note patient has gained 20 pounds over the last 2 months.  Patient was sent from outpatient cardiology to the emergency department for diuresis.  Per EDP, cardiology will have advanced heart failure cardiologist to consult on patient.  Vitals in the ED showed temperature of 98.6, respiration rate 20, heart rate of 68, blood pressure 138/71, SpO2 of 99% on room air.  Serum sodium is 139, potassium 2.9, chloride 106, bicarb 22, BUN of 28, serum creatinine of 2.01, EGFR 32, nonfasting blood glucose 223, WBC 3.6, hemoglobin 12.7, platelets of 136.  BNP was elevated at 3080.  High sensitive troponin was 16.  ED treatment: Potassium chloride 40 mill equivalent p.o. one-time dose, and Lasix 80 mg IV one-time dose.  Upon admission, I discontinued furosemide 80 mg IV urgently and discussed with nursing via secure chat.  Nursing has confirmed that IV furosemide 80 mg has not been pushed. ------------------------------- At bedside, patient is able to tell me her name, age, current location, current calendar year. Patient is dyspneic with providing HPI, requiring frequent  breaks.  He reports gaining 1 pound per day since being discharge in February. He has gained about 20+ pounds. He denies chest pain, fever, cough, nausea, vomiting, dysuria, hematuria, diarrhea. He endorses shortness of breath, worse with exertion.   Daily fluid intake: 32 to 36 ounces of water per day. Coffee: None Soda: None EtOH: None Tea: None Juice: None Milk: None  Social history: He lives at home with his spouse. He is a former tobacco user. He denies etoh and recreational drug use. He is retired.  ROS: Constitutional: no weight change, no fever ENT/Mouth: no sore throat, no rhinorrhea Eyes: no eye pain, no vision changes Cardiovascular: no chest pain, + dyspnea,  no edema, no palpitations Respiratory: no cough, no sputum, no wheezing Gastrointestinal: no nausea, no vomiting, no diarrhea, no constipation Genitourinary: no urinary incontinence, no dysuria, no hematuria Musculoskeletal: no arthralgias, no myalgias Skin: no skin lesions, no pruritus, Neuro: + weakness, no loss of consciousness, no syncope Psych: no anxiety, no depression, no decrease appetite Heme/Lymph: no bruising, no bleeding  ED Course: Discussed with emergency medicine provider, patient requiring hospitalization for chief concerns of acute heart failure exacerbation.  Assessment/Plan  Principal Problem:   Acute decompensated heart failure (HCC) Active Problems:   Hypokalemia   Hypomagnesemia   HTN (hypertension)   CKD stage 3b, GFR 30-44 ml/min (HCC)   CAD, multiple vessel   Type 2 diabetes mellitus with hyperlipidemia (HCC)   Dyslipidemia   Chronic diastolic heart failure (HCC)   S/P CABG x 5   CAD (coronary artery disease)   Type II diabetes mellitus with renal manifestations (HCC)   Complete heart block (HCC)   Dilated cardiomyopathy (HCC)   Paroxysmal atrial fibrillation (HCC)  Acute on chronic heart failure (HCC)   Paroxysmal A-fib (HCC)   Assessment and Plan:  * Acute  decompensated heart failure [facility]) Patient sent from outpatient cardiology clinic Strict I's and O's Epic order placed to [facility] cardiology  Hypomagnesemia Serum magnesium was 1.4 on admission Magnesium 2 g IV one-time dose ordered Repeat magnesium level in the a.m.  Hypokalemia Check urgent magnesium level Ordered potassium IV 10 mill equivalent, 2 doses ordered, with discussion with nursing to initiate prior to giving IV Lasix Repeat potassium scheduled for 1300, will continue to monitor Admit to telemetry cardiac, inpatient  CKD stage 3b, GFR 30-44 ml/min (HCC) At baseline  Type 2 diabetes mellitus with hyperlipidemia (HCC) Home long-acting insulin 15 units nightly resumed Insulin SSI with at bedtime coverage ordered  Dyslipidemia Rosuvastatin 40 mg daily resumed  Paroxysmal A-fib (HCC) Eliquis 2.5 mg p.o. twice daily, Coreg 6.25 mg p.o. twice daily resumed on admission  Dilated cardiomyopathy (HCC) Resumed home carvedilol 6.25 mg p.o. twice daily, hydralazine 25 mg p.o. twice daily, Entresto 97-103 p.o. twice daily, Imdur 30 mg daily, metolazone 2.5 mg daily  CAD (coronary artery disease) Rosuvastatin 40 mg daily, Coreg, Imdur, metolazone, Entresto, Eliquis were resumed on admission  Chart reviewed.   DVT prophylaxis: Eliquis Code Status: Full code Diet: Heart healthy/carb modified Family Communication: A phone call was offered, patient declined stating that his wife already knows he is in hospital Disposition Plan: Pending clinical course Consults called: Cardiology Admission status: Telemetry cardiac, inpatient  Past Medical History:  Diagnosis Date   AV block, Mobitz 1    CAD (coronary artery disease)    a. 08/2019 VF arrest-->sev 3vd on cath-->CABGx5 (LIMA->LAD, VG->OM->LCX, VG->RPDA, VG->Diag); b. 08/2021 Cath: sev native multivessel dzs w/ 5/5 patent grafts. Nl filling pressures->Med rx.   CKD (chronic kidney disease), stage III (HCC)    Diabetes mellitus  without complication (HCC)    HFrEF (heart failure with reduced ejection fraction) (HCC)    a. 08/2020 Echo: EF 40-45%; b. 11/2020 Echo: EF 55%, Gr2 DD; c. 08/2021 Echo: EF 35-40%, glob HK w/ ant/antsept/apical HK; d. 10/2021 s/p CRT-P; e. 12/2021 Echo: EF 30-35%, glob HK, GrIII DD; f. 12/2022 Echo: EF 30-35%, glob HK, sev HK of ant wall, mod asymm LVH, nl RV fxn, mildly dil LA, triv MR, AoV sclerosis.   Hyperlipidemia LDL goal <70    Hypertension    Intermittent Complete heart block (HCC)    a. 08/2021 noted on Zio; b. 10/2021 s/p Abbott Allure RF CRT-P (Ser # [id number]).   Ischemic cardiomyopathy    a. 08/2020 Echo: EF 40-45%; b. 11/2020 Echo: EF 55%; c. 08/2021 Echo: EF 35-40%; d. 10/2021 s/p CRT-P; e. 12/2021 Echo: EF 30-35%, glob HK, GrIII DD.   Trifascicular block    Past Surgical History:  Procedure Laterality Date   BACK SURGERY     CARDIAC CATHETERIZATION     CLIPPING OF ATRIAL APPENDAGE N/A 08/29/2020   Procedure: CLIPPING OF ATRIAL APPENDAGE USING ATRICURE 45 MM ATRICLIP FLEX-V;  Surgeon: Delight Ovens, MD;  Location: [facility] OR;  Service: Open Heart Surgery;  Laterality: N/A;   CORONARY ARTERY BYPASS GRAFT N/A 08/29/2020   Procedure: CORONARY ARTERY BYPASS GRAFTING (CABG) X 5 USING LEFT INTERNAL MAMMARY ARTERY AND ENDOSCOPICALLY HARVESTED RIGHT GREATER SAPHENOUS VEIN. LIMA TO LAD, SVG TO OM SEQ TO CIRC, SVG TO PD, SVG TO DIAG.;  Surgeon: Delight Ovens, MD;  Location: [facility] OR;  Service: Open Heart Surgery;  Laterality: N/A;   ENDOVEIN HARVEST  OF GREATER SAPHENOUS VEIN Right 08/29/2020   Procedure: ENDOVEIN HARVEST OF GREATER SAPHENOUS VEIN;  Surgeon: Delight Ovens, MD;  Location: [facility] OR;  Service: Open Heart Surgery;  Laterality: Right;   HERNIA REPAIR     LEFT HEART CATH AND CORONARY ANGIOGRAPHY N/A 08/25/2020   Procedure: LEFT HEART CATH AND CORONARY ANGIOGRAPHY;  Surgeon: Iran Ouch, MD;  Location: [facility] INVASIVE CV LAB;  Service: Cardiovascular;  Laterality: N/A;    PACEMAKER IMPLANT N/A 10/12/2021   Procedure: PACEMAKER IMPLANT;  Surgeon: Lanier Prude, MD;  Location: [facility] INVASIVE CV LAB;  Service: Cardiovascular;  Laterality: N/A;   RIGHT/LEFT HEART CATH AND CORONARY ANGIOGRAPHY N/A 08/31/2021   Procedure: RIGHT/LEFT HEART CATH AND CORONARY ANGIOGRAPHY;  Surgeon: Iran Ouch, MD;  Location: [facility] INVASIVE CV LAB;  Service: Cardiovascular;  Laterality: N/A;   TEE WITHOUT CARDIOVERSION N/A 08/29/2020   Procedure: TRANSESOPHAGEAL ECHOCARDIOGRAM (TEE);  Surgeon: Delight Ovens, MD;  Location: [facility] OR;  Service: Open Heart Surgery;  Laterality: N/A;   Social History:  reports that he quit smoking about 26 years ago. His smoking use included cigarettes. He has never used smokeless tobacco. He reports that he does not currently use alcohol. He reports that he does not use drugs.  Allergies  Allergen Reactions   Angiotensin Receptor Blockers     hyperkalemia   Spironolactone     Hyperkalemia    Metformin Diarrhea   Family History  Problem Relation Age of Onset   Heart attack Mother    Family history: Family history reviewed and not pertinent.  Prior to Admission medications   Medication Sig Start Date End Date Taking? Authorizing Provider  acetaminophen (TYLENOL) 500 MG tablet Take 1,000 mg by mouth every 6 (six) hours as needed for mild pain, fever or headache.    [provider]  acyclovir (ZOVIRAX) 800 MG tablet Take 1 tablet by mouth daily. 01/21/23   [provider]  albuterol (VENTOLIN HFA) 108 (90 Base) MCG/ACT inhaler Inhale 2 puffs into the lungs every 6 (six) hours as needed for wheezing or shortness of breath. 09/30/21   [provider]  apixaban (ELIQUIS) 2.5 MG TABS tablet Take 1 tablet (2.5 mg total) by mouth 2 (two) times daily. 02/08/23   Eustace Pen, PA-C  carvedilol (COREG) 6.25 MG tablet Take 1 tablet (6.25 mg total) by mouth 2 (two) times daily with a meal. 11/17/22 11/12/23  Creig Hines, NP  dapagliflozin propanediol (FARXIGA) 10 MG TABS tablet Take 10 mg by mouth daily.    [provider]  fluticasone (FLOVENT HFA) 110 MCG/ACT inhaler Inhale 1 puff into the lungs daily.    [provider]  hydrALAZINE (APRESOLINE) 25 MG tablet Take 25 mg by mouth 2 times daily at 12 noon and 4 pm.    [provider]  insulin degludec (TRESIBA) 100 UNIT/ML FlexTouch Pen Inject 0.15 mLs into the skin at bedtime. 01/11/23 01/11/24  [provider]  insulin detemir (LEVEMIR) 100 UNIT/ML injection Inject 0.15 mLs (15 Units total) into the skin at bedtime. Patient not taking: Reported on 02/08/2023 10/30/21   [name], MD  isosorbide mononitrate (IMDUR) 30 MG 24 hr tablet TAKE 1 TABLET BY MOUTH EVERY DAY Patient taking differently: Take 30 mg by mouth 2 (two) times daily. 01/31/23   Delma Freeze, FNP  magnesium oxide (MAG-OX) 400 MG tablet Take 1 tablet (400 mg total) by mouth daily. 01/20/22   Lanier Prude, MD  metolazone (ZAROXOLYN)  2.5 MG tablet TAKE 1 TABLET (2.5) MG 1 HOUR BEFORE LASIX WEEKLY ON TUESDAYS 02/12/23   Clarisa Kindred A, FNP  pantoprazole (PROTONIX) 40 MG tablet Take 40 mg by mouth 2 (two) times daily.    [provider]  polyethylene glycol (MIRALAX / GLYCOLAX) 17 g packet Take 17 g by mouth daily as needed for moderate constipation. 12/19/22   Alford Highland, MD  Potassium Chloride ER 20 MEQ TBCR Take 40 mEq by mouth daily.    [provider]  rosuvastatin (CRESTOR) 40 MG tablet TAKE 1 TABLET BY MOUTH EVERY DAY 12/14/21   Iran Ouch, MD  sacubitril-valsartan (ENTRESTO) 97-103 MG Take 1 tablet by mouth 2 (two) times daily. 12/31/22   Delma Freeze, FNP  torsemide (DEMADEX) 20 MG tablet Take 1 tablet (20 mg total) by mouth daily. Patient taking differently: Take 40 mg by mouth daily. 12/21/22   Delma Freeze, FNP   Physical Exam: Vitals:   03/09/23 1730 03/09/23 1800 03/09/23 1812 03/09/23 1900  BP: (!) 147/90 (!)  145/93  (!) 141/89  Pulse: 68 62  (!) 58  Resp:    20  Temp:   98.2 F (36.8 C)   TempSrc:   Oral   SpO2: 98% 99%  100%  Weight:      Height:       Constitutional: appears dyspneic, requiring frequent breaks during HPI process, chronically ill Eyes: PERRL, lids and conjunctivae normal ENMT: Mucous membranes are moist. Posterior pharynx clear of any exudate or lesions. Age-appropriate dentition. Hearing appropriate Neck: normal, supple, no masses, no thyromegaly Respiratory: Generalized decreased lung sounds bilaterally on auscultation, no wheezing, no crackles.  Increased respiratory effort.  Increased use of accessory muscle use.  Cardiovascular: Regular rate and rhythm, no murmurs / rubs / gallops. 2+ pitting lower extremity edema. 2+ pedal pulses. No carotid bruits.  Abdomen: Obese abdomen, no tenderness, no masses palpated, no hepatosplenomegaly. Bowel sounds positive.  Musculoskeletal: no clubbing / cyanosis. No joint deformity upper and lower extremities. Good ROM, no contractures, no atrophy. Normal muscle tone.  Skin: no rashes, lesions, ulcers. No induration Neurologic: Sensation intact. Strength 5/5 in all 4.  Psychiatric: Normal judgment and insight. Alert and oriented x 3. Normal mood.   EKG: independently reviewed, showing V paced rhythm with rate of 80, QTc 525  Chest x-ray on Admission: I personally reviewed and I agree with radiologist reading as below.  DG Chest 2 View  Result Date: 03/09/2023 CLINICAL DATA:  Shortness of breath.  Fluid retention. EXAM: CHEST - 2 VIEW COMPARISON:  Two-view chest x-ray 12/15/2022 FINDINGS: The heart is enlarged, stable. Pacing wires are stable. Mild edema and small effusions are less severe than on the prior study. The visualized soft tissues and bony thorax are unremarkable. IMPRESSION: Cardiomegaly with mild edema and small effusions compatible with congestive heart failure. Electronically Signed   By: Marin Roberts M.D.   On:  03/09/2023 13:49    Labs on Admission: I have personally reviewed following labs  CBC: Recent Labs  Lab 03/09/23 1252  WBC 3.6*  HGB 12.7*  HCT 41.4  MCV 87.7  PLT 136*   Basic Metabolic Panel: Recent Labs  Lab 03/09/23 1252 03/09/23 1824  NA 139  --   K 2.9* 4.1  CL 106  --   CO2 22  --   GLUCOSE 223*  --   BUN 28*  --   CREATININE 2.01*  --   CALCIUM 7.3*  --  MG 1.4*  --    GFR: Estimated Creatinine Clearance: 32 mL/min (A) (by C-G formula based on SCr of 2.01 mg/dL (H)).  Urine analysis:    Component Value Date/Time   COLORURINE YELLOW 09/01/2020 2035   APPEARANCEUR HAZY (A) 09/01/2020 2035   LABSPEC 1.010 09/01/2020 2035   PHURINE 5.0 09/01/2020 2035   GLUCOSEU NEGATIVE 09/01/2020 2035   HGBUR MODERATE (A) 09/01/2020 2035   BILIRUBINUR NEGATIVE 09/01/2020 2035   KETONESUR NEGATIVE 09/01/2020 2035   PROTEINUR NEGATIVE 09/01/2020 2035   NITRITE NEGATIVE 09/01/2020 2035   LEUKOCYTESUR TRACE (A) 09/01/2020 2035   CRITICAL CARE Performed by: Dr. Sedalia Muta  Total critical care time: 35 minutes  Critical care time was exclusive of separately billable procedures and treating other patients.  Critical care was necessary to treat or prevent imminent or life-threatening deterioration.  Critical care was time spent personally by me on the following activities: development of treatment plan with patient and/or surrogate as well as nursing, discussions with consultants, evaluation of patient's response to treatment, examination of patient, obtaining history from patient or surrogate, ordering and performing treatments and interventions, ordering and review of laboratory studies, ordering and review of radiographic studies, pulse oximetry and re-evaluation of patient's condition.  This document was prepared using Dragon Voice Recognition software and may include unintentional dictation errors.  Dr. Sedalia Muta [facility]  If 7PM-7AM, please contact overnight-coverage  provider If 7AM-7PM, please contact day attending provider [email or web address]  03/09/2023, 7:48 PM

## 2023-03-10 ENCOUNTER — Ambulatory Visit (HOSPITAL_COMMUNITY): Payer: Medicare HMO | Admitting: Internal Medicine

## 2023-03-10 DIAGNOSIS — N1832 Chronic kidney disease, stage 3b: Secondary | ICD-10-CM

## 2023-03-10 DIAGNOSIS — I509 Heart failure, unspecified: Secondary | ICD-10-CM | POA: Diagnosis not present

## 2023-03-10 DIAGNOSIS — I5043 Acute on chronic combined systolic (congestive) and diastolic (congestive) heart failure: Secondary | ICD-10-CM

## 2023-03-10 LAB — CBC
HCT: 43.8 % (ref 39.0–52.0)
Hemoglobin: 13.5 g/dL (ref 13.0–17.0)
MCH: 27.1 pg (ref 26.0–34.0)
MCHC: 30.8 g/dL (ref 30.0–36.0)
MCV: 88 fL (ref 80.0–100.0)
Platelets: 136 10*3/uL — ABNORMAL LOW (ref 150–400)
RBC: 4.98 MIL/uL (ref 4.22–5.81)
RDW: 15.3 % (ref 11.5–15.5)
WBC: 3.9 10*3/uL — ABNORMAL LOW (ref 4.0–10.5)
nRBC: 0 % (ref 0.0–0.2)

## 2023-03-10 LAB — GLUCOSE, CAPILLARY
Glucose-Capillary: 114 mg/dL — ABNORMAL HIGH (ref 70–99)
Glucose-Capillary: 158 mg/dL — ABNORMAL HIGH (ref 70–99)

## 2023-03-10 LAB — CBG MONITORING, ED
Glucose-Capillary: 111 mg/dL — ABNORMAL HIGH (ref 70–99)
Glucose-Capillary: 191 mg/dL — ABNORMAL HIGH (ref 70–99)

## 2023-03-10 LAB — BASIC METABOLIC PANEL
Anion gap: 10 (ref 5–15)
BUN: 24 mg/dL — ABNORMAL HIGH (ref 8–23)
CO2: 26 mmol/L (ref 22–32)
Calcium: 7.8 mg/dL — ABNORMAL LOW (ref 8.9–10.3)
Chloride: 104 mmol/L (ref 98–111)
Creatinine, Ser: 1.8 mg/dL — ABNORMAL HIGH (ref 0.61–1.24)
GFR, Estimated: 36 mL/min — ABNORMAL LOW (ref >60)
Glucose, Bld: 140 mg/dL — ABNORMAL HIGH (ref 70–99)
Potassium: 2.9 mmol/L — ABNORMAL LOW (ref 3.5–5.1)
Sodium: 140 mmol/L (ref 135–145)

## 2023-03-10 LAB — POTASSIUM: Potassium: 3.3 mmol/L — ABNORMAL LOW (ref 3.5–5.1)

## 2023-03-10 LAB — MAGNESIUM: Magnesium: 1.7 mg/dL (ref 1.7–2.4)

## 2023-03-10 MED ORDER — POTASSIUM CHLORIDE CRYS ER 20 MEQ PO TBCR
40.0000 meq | EXTENDED_RELEASE_TABLET | Freq: Two times a day (BID) | ORAL | Status: DC
  Administered 2023-03-10 – 2023-03-12 (×5): 40 meq via ORAL
  Filled 2023-03-10 (×5): qty 2

## 2023-03-10 MED ORDER — POTASSIUM CHLORIDE 10 MEQ/100ML IV SOLN
10.0000 meq | INTRAVENOUS | Status: AC
  Administered 2023-03-10 (×2): 10 meq via INTRAVENOUS
  Filled 2023-03-10 (×2): qty 100

## 2023-03-10 MED ORDER — FUROSEMIDE 10 MG/ML IJ SOLN
40.0000 mg | Freq: Two times a day (BID) | INTRAMUSCULAR | Status: DC
  Administered 2023-03-10 – 2023-03-12 (×4): 40 mg via INTRAVENOUS
  Filled 2023-03-10 (×4): qty 4

## 2023-03-10 NOTE — Consult Note (Addendum)
   Heart Failure Nurse Navigator Note  HFrEF 30 to 35%.  Severe hypokinesis of the anterior wall.  Left ventricular internal cavity is mildly dilated.  Was seen in his cardiologist office and recommended to proceed to the emergency room with approximately 20 pound weight gain.  BNP 3080.  Chest x-ray revealed cardiomegaly with mild pulmonary edema and small bilateral effusions.  Comorbidities:  Paroxysmal atrial fibrillation on Eliquis Coronary artery disease/CABG Chronic kidney disease stage III Type 2 diabetes Hyperlipidemia Lymphedema  Insertion of CRT-D  Medications:  Apixaban 2.5 mg 2 times a day Coreg 6.25 mg 2 times a day with meals Farxiga 10 mg Hydralazine 25 mg 2 times a day Isosorbide mononitrate 30 mg daily Metolazone 2.5 mg weekly Potassium chloride 10 mill equivalents IV Crestor 40 mg daily Entresto 97/103 mg 2 times a day Lasix 80 mg IV x 1  Labs:  Sodium 140, potassium 2.9, chloride 104, CO2 26, BUN 24, creatinine 1.8, estimated GFR 36. Documented 108 kg Intake 220 mL Output 5250 mL  Initial meeting with patient who is lying on a gurney in the emergency room in no acute distress.  He states that he feels somewhat better than he did yesterday.  He is still remains edematous and on oxygen.  He states at home he had been weighing himself daily.  He had noted 1 day might be up a pound then the next day he might be down a pound and then from there he would gain another pound daily.  discussed parameters of calling the clinic if he gains 2 pounds overnight or more than 5 pounds within the week.  He states that he had thought about calling the heart failure clinic about his weight gain but since he had not gained 2 pounds overnight he did not call.  Also discussed changes in symptoms as seen more lower extremity edema or being more short of breath, PND and orthopnea he voices understanding.  He did not feel that he went over 32 ounces taken and daily and is not  using salt his wife is not cooking with salt.  Reinforced again that weight parameters, he voices understanding.  He states he does not remember being told about calling if he gains more than 5 pounds within the week.  Patient states he had tried wearing compression stockings but felt they made the swelling worse and caused pain in his legs.  He states he did put them on first thing in the morning.  He had no further questions and we will continue to follow along.  He has follow-up with the outpatient heart failure clinic on May 20 at 10 AM.  He has a 10% no-show which is 13 out of 124 appointments. Tresa Endo RN [facility]

## 2023-03-10 NOTE — Consult Note (Signed)
PHARMACY CONSULT NOTE - FOLLOW UP  Pharmacy Consult for Electrolyte Monitoring and Replacement   Recent Labs: Potassium (mmol/L)  Date Value  03/10/2023 2.9 (L)  01/06/2014 3.0 (L)   Magnesium (mg/dL)  Date Value  16/08/9603 1.7  01/06/2014 0.9 (L)   Calcium (mg/dL)  Date Value  54/07/8118 7.8 (L)   Calcium, Total (mg/dL)  Date Value  14/78/2956 7.7 (L)   Albumin (g/dL)  Date Value  21/30/8657 2.9 (L)  01/06/2014 3.5   Phosphorus (mg/dL)  Date Value  84/69/6295 2.5   Sodium (mmol/L)  Date Value  03/10/2023 140  02/02/2022 146 (H)  01/06/2014 142     Assessment: 88 year old male, presents to the ED for weight gain from outpatient cardiology clinic. PMH: CAD, CHF, CKD, HLD, HTN, DM. Pt is currently on lasix 40 mg IV BID and entresto. Potassium trending down due to lasix.   Goal of Therapy:  K+ > 4 Mg > 2  Plan:  Medical team ordered Kcl 10 mEq IV x 2 and started on Kcl 40 mEq BID and Mg 2 g IV x 1.  F/u with AM labs.   Ronnald Ramp ,PharmD Clinical Pharmacist 03/10/2023 2:51 PM

## 2023-03-10 NOTE — TOC Initial Note (Signed)
Transition of Care [facility]) - Initial/Assessment Note    Patient Details  Name: [name] MRN: [id number] Date of Birth: 05-23-1935  Transition of Care [facility]) CM/SW Contact:    Margarito Liner, LCSW Phone Number: 03/10/2023, 12:48 PM  Clinical Narrative:  Readmission prevention screen complete. CSW met with patient. No supports at bedside. CSW introduced role and explained that discharge planning would be discussed. PCP is Jeffie Pollock, MD in [place]. Patient drives himself to appointments. [facility] [address]. No issues obtaining medications. Patient lives with his wife and daughter. No home health or DME use prior to admission. No further concerns. CSW encouraged patient to contact CSW as needed. CSW will continue to follow patient for support and facilitate return home once stable. Wife will transport him home at discharge.                Expected Discharge Plan: Home/Self Care Barriers to Discharge: Continued Medical Work up   Patient Goals and CMS Choice            Expected Discharge Plan and Services     Post Acute Care Choice: NA Living arrangements for the past 2 months: Single Family Home                                      Prior Living Arrangements/Services Living arrangements for the past 2 months: Single Family Home Lives with:: Spouse, Adult Children Patient language and need for interpreter reviewed:: Yes Do you feel safe going back to the place where you live?: Yes      Need for Family Participation in Patient Care: Yes (Comment) Care giver support system in place?: Yes (comment)   Criminal Activity/Legal Involvement Pertinent to Current Situation/Hospitalization: No - Comment as needed  Activities of Daily Living      Permission Sought/Granted                  Emotional Assessment Appearance:: Appears stated age Attitude/Demeanor/Rapport: Engaged Affect (typically observed): Appropriate Orientation: :  Oriented to Self, Oriented to Place, Oriented to  Time, Oriented to Situation Alcohol / Substance Use: Not Applicable Psych Involvement: No (comment)  Admission diagnosis:  Acute on chronic heart failure (HCC) [I50.9] Patient Active Problem List   Diagnosis Date Noted   Acute on chronic heart failure (HCC) 03/09/2023   Paroxysmal A-fib (HCC) 03/09/2023   Hypomagnesemia 03/09/2023   Hypercoagulable state due to paroxysmal atrial fibrillation (HCC) 02/08/2023   Paroxysmal atrial fibrillation (HCC) 02/08/2023   Constipation 12/19/2022   Chest pain 12/19/2022   Hypertensive heart disease with heart failure (HCC) 12/18/2022   Thrombocytopenia (HCC) 12/17/2022   CKD stage 3b, GFR 30-44 ml/min (HCC) 12/17/2022   Obesity (BMI 30-39.9) 12/17/2022   Hypokalemia 12/17/2022   Ischemic cardiomyopathy 12/17/2022   Hypertensive urgency 03/29/2022   Dyslipidemia 03/29/2022   GERD without esophagitis 03/29/2022   Type 2 diabetes mellitus with hyperlipidemia (HCC) 03/29/2022   Elevated troponin 03/29/2022   Acute on chronic systolic CHF (congestive heart failure) (HCC) 01/04/2022   Dilated cardiomyopathy (HCC) 10/30/2021   Acute decompensated heart failure (HCC) 10/27/2021   Complete heart block (HCC) 10/12/2021   Junctional bradycardia    Acute on chronic diastolic CHF (congestive heart failure) (HCC) 08/26/2021   CAD (coronary artery disease)    HLD (hyperlipidemia)    GERD (gastroesophageal reflux disease)    Type II diabetes mellitus with  renal manifestations (HCC)    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   CAD, multiple vessel 08/25/2020   Non-ST elevation (NSTEMI) myocardial infarction (HCC) 08/21/2020   History of cardiac arrest 08/21/2020   HFrEF (heart failure with reduced ejection fraction) (HCC)    History of sustained ventricular fibrillation 08/18/2020   Acute respiratory failure with hypoxia (HCC)    Chronic diastolic heart failure (HCC)    UTI (urinary tract infection) 07/26/2019   HTN  (hypertension) 07/26/2019   Diabetes (HCC) 07/26/2019   Unstable angina (HCC) 12/03/2008   Status post percutaneous transluminal coronary angioplasty 10/08/2008   Malignant neoplasm of prostate (HCC) 10/05/2005   PCP:  Idelia Salm, MD Pharmacy:   [phone] [address] [address], [place] - [address] [place] [address] Phone: [phone] Fax: [phone]     Social Determinants of Health (SDOH) Social History: SDOH Screenings   Food Insecurity: No Food Insecurity (12/16/2022)  Housing: Low Risk  (12/16/2022)  Transportation Needs: No Transportation Needs (12/16/2022)  Utilities: Not At Risk (12/16/2022)  Depression (PHQ2-9): Low Risk  (04/07/2022)  Financial Resource Strain: Low Risk  (07/27/2019)  Physical Activity: Inactive (07/27/2019)  Social Connections: Unknown (07/27/2019)  Stress: No Stress Concern Present (07/27/2019)  Tobacco Use: Medium Risk (03/09/2023)   SDOH Interventions:     Readmission Risk Interventions    03/10/2023   12:47 PM 03/29/2022    1:18 PM 01/05/2022    2:16 PM  Readmission Risk Prevention Plan  Transportation Screening Complete Complete Complete  PCP or Specialist Appt within 3-5 Days Complete Complete Complete  HRI or Home Care Consult   Complete  Social Work Consult for Recovery Care Planning/Counseling Complete  Complete  Palliative Care Screening Not Applicable Not Applicable Not Applicable  Medication Review Oceanographer) Complete Complete Complete

## 2023-03-10 NOTE — Discharge Instructions (Signed)

## 2023-03-10 NOTE — Progress Notes (Signed)
[facility]  - [facility]   PATIENT NAME: [name]    MR#:  [id number]  DATE OF BIRTH:  1935-08-17  SUBJECTIVE:  no family at bedside. Patient apparently came in with increasing shortness of breath leg edema and weight gain. Follows with cardiology seen in the office and for IV diuretics. Patient overall feels a bit better. Good urine output.   VITALS:  Blood pressure (!) 142/91, pulse 67, temperature 98.3 F (36.8 C), temperature source Oral, resp. rate 14, height 5\' 8"  (1.727 m), weight 108 kg, SpO2 97 %.  PHYSICAL EXAMINATION:   GENERAL:  88 y.o.-year-old patient with no acute distress.  LUNGS: Normal breath sounds bilaterally, no wheezing CARDIOVASCULAR: S1, S2 normal. No murmur   ABDOMEN: Soft, nontender, nondistended. Bowel sounds present.  EXTREMITIES: No  edema b/l.    NEUROLOGIC: nonfocal  patient is alert and awake SKIN: No obvious rash, lesion, or ulcer.   LABORATORY PANEL:  CBC Recent Labs  Lab 03/10/23 0546  WBC 3.9*  HGB 13.5  HCT 43.8  PLT 136*    Chemistries  Recent Labs  Lab 03/10/23 0546  NA 140  K 2.9*  CL 104  CO2 26  GLUCOSE 140*  BUN 24*  CREATININE 1.80*  CALCIUM 7.8*  MG 1.7   Cardiac Enzymes No results for input(s): "TROPONINI" in the last 168 hours. RADIOLOGY:  DG Chest 2 View  Result Date: 03/09/2023 CLINICAL DATA:  Shortness of breath.  Fluid retention. EXAM: CHEST - 2 VIEW COMPARISON:  Two-view chest x-ray 12/15/2022 FINDINGS: The heart is enlarged, stable. Pacing wires are stable. Mild edema and small effusions are less severe than on the prior study. The visualized soft tissues and bony thorax are unremarkable. IMPRESSION: Cardiomegaly with mild edema and small effusions compatible with congestive heart failure. Electronically Signed   By: Marin Roberts M.D.   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  patient has gained 20 pounds over the last 2 months.  Acute on chronic systolic congestive heart failure ischemic cardiomyopathy hypertensive heart disease -- patient came in with increasing shortness of breath leg edema and now on oxygen -- received 80 mg IV six yesterday --Will continue on IV Lasix 40 mg BID, monitor creatinine,input output -- [facility] cardiology input noted -- continue Coreg, hydralazine, Farxiga, nitrates,Imdur and enteresto --Echo feb 2024--showed EF 30-35% Left ventricular ejection fraction, by estimation, is 30 to 35%. The left ventricle has moderately decreased function. The left ventricle  demonstrates global hypokinesis with severe hypokinesis of the anterior  wall. The left ventricular internal cavity size was mildly dilated. There is moderate asymmetric left ventricular hypertrophy of the septal segment.  --UOP 5 L so far  Chronic kidney disease stage IIIB -- creatinine stable -- monitor while on diuretics  Diabetes with renal manifestation -- sliding scale insulin  [name] -- continue PPI  CAD status post CABG history of V. fib arrest in the setting of MI -- continue nitrates, antiplatelet agent and statins  Obesity -- BMI 36.1 -- diet and lifestyle discussed   Procedures: Family communication :none Consults :[facility]  cardiology CODE STATUS: full DVT Prophylaxis :eliquis Level of care: Telemetry Cardiac Status is: Inpatient Remains inpatient appropriate because: systolic CHF requiring IV diuresis    TOTAL TIME TAKING CARE OF THIS PATIENT: 35 minutes.  >50% time spent  on counselling and coordination of care  Note: This dictation was prepared with Dragon dictation along with smaller phrase technology. Any transcriptional errors that result from this process are unintentional.  Enedina Finner M.D    [facility]   CC: Primary care physician; Idelia Salm, MD
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
## 2023-03-10 NOTE — Progress Notes (Signed)
Chap visited with Pt responding to a Spiritual Consult. Pt was not interested in feeling the [employer] as of now. He wish to complete the paper work when he gets home. He however request this Mirna Mires to pray for him and his fam a request I honored.  03/10/23 1600  Spiritual Encounters  Type of Visit Initial  Care provided to: Pt and family  Referral source Nurse (RN/NT/LPN)  Reason for visit Advance directives  OnCall Visit No  Spiritual Framework  Presenting Themes Values and beliefs  Interventions  Spiritual Care Interventions Made Compassionate presence;Prayer;Mindfulness intervention  Intervention Outcomes  Outcomes Awareness of support;Connection to spiritual care  Spiritual Care Plan  Spiritual Care Issues Still Outstanding No further spiritual care needs at this time (see row info)

## 2023-03-11 DIAGNOSIS — E1169 Type 2 diabetes mellitus with other specified complication: Secondary | ICD-10-CM

## 2023-03-11 DIAGNOSIS — I25118 Atherosclerotic heart disease of native coronary artery with other forms of angina pectoris: Secondary | ICD-10-CM

## 2023-03-11 DIAGNOSIS — Z794 Long term (current) use of insulin: Secondary | ICD-10-CM

## 2023-03-11 DIAGNOSIS — I10 Essential (primary) hypertension: Secondary | ICD-10-CM

## 2023-03-11 DIAGNOSIS — E785 Hyperlipidemia, unspecified: Secondary | ICD-10-CM

## 2023-03-11 DIAGNOSIS — I480 Paroxysmal atrial fibrillation: Secondary | ICD-10-CM

## 2023-03-11 DIAGNOSIS — I420 Dilated cardiomyopathy: Secondary | ICD-10-CM

## 2023-03-11 DIAGNOSIS — I509 Heart failure, unspecified: Secondary | ICD-10-CM | POA: Diagnosis not present

## 2023-03-11 DIAGNOSIS — Z951 Presence of aortocoronary bypass graft: Secondary | ICD-10-CM

## 2023-03-11 LAB — BASIC METABOLIC PANEL
Anion gap: 10 (ref 5–15)
BUN: 24 mg/dL — ABNORMAL HIGH (ref 8–23)
CO2: 28 mmol/L (ref 22–32)
Calcium: 7.7 mg/dL — ABNORMAL LOW (ref 8.9–10.3)
Chloride: 101 mmol/L (ref 98–111)
Creatinine, Ser: 1.86 mg/dL — ABNORMAL HIGH (ref 0.61–1.24)
GFR, Estimated: 35 mL/min — ABNORMAL LOW (ref >60)
Glucose, Bld: 113 mg/dL — ABNORMAL HIGH (ref 70–99)
Potassium: 3.2 mmol/L — ABNORMAL LOW (ref 3.5–5.1)
Sodium: 139 mmol/L (ref 135–145)

## 2023-03-11 LAB — MAGNESIUM: Magnesium: 1.7 mg/dL (ref 1.7–2.4)

## 2023-03-11 LAB — GLUCOSE, CAPILLARY
Glucose-Capillary: 155 mg/dL — ABNORMAL HIGH (ref 70–99)
Glucose-Capillary: 229 mg/dL — ABNORMAL HIGH (ref 70–99)
Glucose-Capillary: 124 mg/dL — ABNORMAL HIGH (ref 70–99)
Glucose-Capillary: 133 mg/dL — ABNORMAL HIGH (ref 70–99)

## 2023-03-11 MED ORDER — MAGNESIUM SULFATE 2 GM/50ML IV SOLN
2.0000 g | Freq: Once | INTRAVENOUS | Status: AC
  Administered 2023-03-11: 2 g via INTRAVENOUS
  Filled 2023-03-11: qty 50

## 2023-03-11 NOTE — Addendum Note (Signed)
Addended by: Auburn Bilberry D on: 03/11/2023 12:36 PM   Modules accepted: Orders

## 2023-03-11 NOTE — Progress Notes (Signed)
Rounding Note    Patient Name: [name] Date of Encounter: 03/11/2023  [facility] Cardiologist: Lorine Bears, MD   Subjective   Patient seen on AM rounds. Denies any chest pain or worsening shortness of breath. - 5.1 L output in the last 24 hours. Weight dow 17.6 pounds since admission.  Inpatient Medications    Scheduled Meds:  acyclovir  800 mg Oral Daily   apixaban  2.5 mg Oral BID   carvedilol  6.25 mg Oral BID WC   dapagliflozin propanediol  10 mg Oral Daily   furosemide  40 mg Intravenous Q12H   hydrALAZINE  25 mg Oral q12n4p   insulin aspart  0-5 Units Subcutaneous QHS   insulin aspart  0-9 Units Subcutaneous TID WC   insulin glargine-yfgn  15 Units Subcutaneous QHS   isosorbide mononitrate  30 mg Oral Daily   pantoprazole  40 mg Oral BID   potassium chloride  40 mEq Oral BID   rosuvastatin  40 mg Oral Daily   sacubitril-valsartan  1 tablet Oral BID   Continuous Infusions:  magnesium sulfate bolus IVPB     PRN Meds: acetaminophen **OR** acetaminophen, albuterol, ondansetron **OR** ondansetron (ZOFRAN) IV, polyethylene glycol, senna-docusate   Vital Signs    Vitals:   03/11/23 0511 03/11/23 0541 03/11/23 0857 03/11/23 1140  BP: (!) 108/46  134/71 122/62  Pulse: 64  64 68  Resp: 18  15 20   Temp: 98.3 F (36.8 C)  (!) 97.5 F (36.4 C) 98.1 F (36.7 C)  TempSrc:      SpO2: 96%  97% 98%  Weight:  100.7 kg    Height:        Intake/Output Summary (Last 24 hours) at 03/11/2023 1145 Last data filed at 03/11/2023 1115 Gross per 24 hour  Intake 600 ml  Output 5250 ml  Net -4650 ml      03/11/2023    5:41 AM 03/09/2023   12:35 PM 03/09/2023    9:42 AM  Last 3 Weights  Weight (lbs) 222 lb 0.1 oz 238 lb 237 lb 12.8 oz  Weight (kg) 100.7 kg 107.956 kg 107.865 kg      Telemetry    AV paced, 1 episode of NSVT (3 beats) rate 60-70 - Personally Reviewed  ECG    No new tracings - Personally Reviewed  Physical Exam   GEN: No acute distress.    Neck: + JVD Cardiac: RRR, no murmurs, rubs, or gallops.  Respiratory: Clear to auscultation bilaterally.Respirations are unlabored on room air GI: Soft, nontender, non-distended  MS: 1+ edema BLE; No deformity. Neuro:  Nonfocal  Psych: Normal affect   Labs    High Sensitivity Troponin:   Recent Labs  Lab 03/09/23 1252 03/09/23 1500  TROPONINIHS 16 17     Chemistry Recent Labs  Lab 03/09/23 1252 03/09/23 1824 03/10/23 0546 03/10/23 1530 03/11/23 0409  NA 139  --  140  --  139  K 2.9*   < > 2.9* 3.3* 3.2*  CL 106  --  104  --  101  CO2 22  --  26  --  28  GLUCOSE 223*  --  140*  --  113*  BUN 28*  --  24*  --  24*  CREATININE 2.01*  --  1.80*  --  1.86*  CALCIUM 7.3*  --  7.8*  --  7.7*  MG 1.4*  --  1.7  --  1.7  GFRNONAA 32*  --  36*  --  35*  ANIONGAP 11  --  10  --  10   < > = values in this interval not displayed.    Lipids No results for input(s): "CHOL", "TRIG", "HDL", "LABVLDL", "LDLCALC", "CHOLHDL" in the last 168 hours.  Hematology Recent Labs  Lab 03/09/23 1252 03/10/23 0546  WBC 3.6* 3.9*  RBC 4.72 4.98  HGB 12.7* 13.5  HCT 41.4 43.8  MCV 87.7 88.0  MCH 26.9 27.1  MCHC 30.7 30.8  RDW 15.2 15.3  PLT 136* 136*   Thyroid No results for input(s): "TSH", "FREET4" in the last 168 hours.  BNP Recent Labs  Lab 03/09/23 1252  BNP 3,080.1*    DDimer No results for input(s): "DDIMER" in the last 168 hours.   Radiology    DG Chest 2 View  Result Date: 03/09/2023 CLINICAL DATA:  Shortness of breath.  Fluid retention. EXAM: CHEST - 2 VIEW COMPARISON:  Two-view chest x-ray 12/15/2022 FINDINGS: The heart is enlarged, stable. Pacing wires are stable. Mild edema and small effusions are less severe than on the prior study. The visualized soft tissues and bony thorax are unremarkable. IMPRESSION: Cardiomegaly with mild edema and small effusions compatible with congestive heart failure. Electronically Signed   By: Marin Roberts M.D.   On: 03/09/2023  13:49    Cardiac Studies   2D echo 12/16/22 1. Left ventricular ejection fraction, by estimation, is 30 to 35%. The  left ventricle has moderately decreased function. The left ventricle  demonstrates global hypokinesis with severe hypokinesis of the anterior  wall. The left ventricular internal  cavity size was mildly dilated. There is moderate asymmetric left  ventricular hypertrophy of the septal segment.   2. Right ventricle is not well visualized. Right ventricular systolic  function is grossly normal. The right ventricular size is normal.  Tricuspid regurgitation signal is inadequate for assessing PA pressure.   3. Left atrial size was mildly dilated.   4. The mitral valve is normal in structure. Trivial mitral valve  regurgitation. No evidence of mitral stenosis.   5. The aortic valve is normal in structure. Aortic valve regurgitation is  not visualized. Aortic valve sclerosis is present, with no evidence of  aortic valve stenosis.   6. The inferior vena cava is normal in size with greater than 50%  respiratory variability, suggesting right atrial pressure of 3 mmHg.   Patient Profile     88 y.o. 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  Assessment & Plan    Acute on chronic HFrEF/ischemic cardiomyopathy -Shortness of breath or peripheral edema continues to improve --5.1 L output in the last 24 hours -Down 8 kg of 70.6 pounds since admission -Continued with some abdominal distention and lower extremity edema -Respirations are unlabored on room air -Continued on carvedilol 6.25 mg twice daily, continued on Farxiga 10 mg daily, continued on furosemide 40 mg IV twice  daily, continued on Entresto 97/23 mg twice daily -Continue with heart failure education -Daily weights, I's and O's, low-sodium diet  Coronary artery disease status post V-fib arrest in 08/2020, CABG times 5 -Continues to remain chest pain-free -History of CABG -Cardiomyopathy with ejection fraction of 30-35% -High-sensitivity troponins negative -No indication for heparin drip -No plan for ischemic workup  Essential  hypertension -Blood pressure 134/71 -Continued on carvedilol, furosemide, hydralazine, Imdur, and Entresto -Vital signs per unit protocol  Hyperlipidemia -Continued on rosuvastatin  Paroxysmal atrial fibrillation -Currently AF V paced on telemetry monitoring -Continued on apixaban 2.5 mg twice daily -Continued on carvedilol 6.25 mg twice daily -Continue with telemetry monitoring  Hypokalemia -Serum potassium 3.2 -Continued on twice daily supplements -Recommend keeping potassium level greater than 4 less than 5 -Daily BMP -Monitor/trend/replete electrolytes as needed  Stage III-IV chronic kidney disease -Serum creatinine 1.86 -Continues at baseline -Monitor urine output -Daily BMP -Avoid nephrotoxic agents were able  Type 2 diabetes -Continued on insulin -Management per IM     For questions or updates, please contact [facility] Please consult [email or web address] for contact info under        Signed, Dora Clauss, NP  03/11/2023, 11:45 AM

## 2023-03-11 NOTE — Progress Notes (Signed)
[facility]  - [place] [facility]   PATIENT NAME: [name]    MR#:  [id number]  DATE OF BIRTH:  03-24-1935  SUBJECTIVE:  no family at bedside. Patient  came in with increasing shortness of breath leg edema and weight gain. Follows with cardiology seen in the office and for IV diuretics. Patient overall feels a bit better. Good urine output   VITALS:  Blood pressure 122/62, pulse 68, temperature 98.1 F (36.7 C), resp. rate 20, height 5\' 8"  (1.727 m), weight 100.7 kg, SpO2 98 %.  PHYSICAL EXAMINATION:   GENERAL:  88 y.o.-year-old patient with no acute distress.  LUNGS: Normal breath sounds bilaterally, no wheezing CARDIOVASCULAR: S1, S2 normal. No murmur   ABDOMEN: Soft, nontender, nondistended. Bowel sounds present.  EXTREMITIES: +edema b/l.    NEUROLOGIC: nonfocal  patient is alert and awake SKIN: No obvious rash, lesion, or ulcer.   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  patient has gained 20 pounds over the last 2 months.  Acute on chronic systolic congestive heart failure ischemic cardiomyopathy hypertensive heart disease -- patient came in with increasing shortness of breath leg edema and now on oxygen -- received 80 mg IV six  yesterday --Will continue on IV Lasix 40 mg BID, monitor creatinine,input output -- [facility] cardiology input noted -- continue Coreg, hydralazine, Farxiga, nitrates,Imdur and enteresto --Echo feb 2024--showed EF 30-35% Left ventricular ejection fraction, by estimation, is 30 to 35%. The left ventricle has moderately decreased function. The left ventricle  demonstrates global hypokinesis with severe hypokinesis of the anterior  wall. The left ventricular internal cavity size was mildly dilated. There is moderate asymmetric left ventricular hypertrophy of the septal segment.  --UOP 11 L so far  Chronic kidney disease stage IIIB -- creatinine stable -- monitor while on diuretics  Diabetes with renal manifestation -- sliding scale insulin  [name] -- continue PPI  CAD status post CABG history of V. fib arrest in the setting of MI -- continue nitrates, antiplatelet agent and statins  Obesity -- BMI 36.1 -- diet and lifestyle discussed  Overall improving clinically. If remains stable will discharge tomorrow.    Procedures: Family communication : Twana First wife on the phone Consults :[facility] cardiology CODE STATUS: full DVT Prophylaxis :eliquis Level of care: Telemetry Cardiac Status is: Inpatient Remains inpatient appropriate because: systolic CHF requiring IV diuresis  EDD-03/12/2023  TOTAL TIME TAKING CARE OF THIS PATIENT: 35 minutes.  >50% time spent on counselling and coordination of care  Note: This dictation was prepared with Dragon dictation along with smaller phrase technology. Any transcriptional errors that result from this process are unintentional.  Enedina Finner M.D    [facility]   CC: Primary care physician; Idelia Salm, MD

## 2023-03-11 NOTE — Consult Note (Signed)
PHARMACY CONSULT NOTE  Pharmacy Consult for Electrolyte Monitoring and Replacement   Recent Labs: Potassium (mmol/L)  Date Value  03/11/2023 3.2 (L)  01/06/2014 3.0 (L)   Magnesium (mg/dL)  Date Value  16/08/9603 1.7  01/06/2014 0.9 (L)   Calcium (mg/dL)  Date Value  54/07/8118 7.7 (L)   Calcium, Total (mg/dL)  Date Value  14/78/2956 7.7 (L)   Albumin (g/dL)  Date Value  21/30/8657 2.9 (L)  01/06/2014 3.5   Phosphorus (mg/dL)  Date Value  84/69/6295 2.5   Sodium (mmol/L)  Date Value  03/11/2023 139  02/02/2022 146 (H)  01/06/2014 142    Assessment: 88 year old male, presents to the ED for weight gain from outpatient cardiology clinic. PMH: CAD, CHF, CKD, HLD, HTN, DM. Pt is currently on lasix 40 mg IV BID and entresto. Potassium trending down due to lasix.   Goal of Therapy:  K+ > 4 Mg > 2  Plan:  --Continue Kcl 40 mEq PO BID --IV magnesium sulfate 2 g x 1 --Labs tomorrow AM  Tressie Ellis 03/11/2023 7:12 AM

## 2023-03-11 NOTE — Progress Notes (Signed)
   Heart Failure Nurse Navigator Note  Met with patient today, he was sitting up in chair at bedside, states his swelling has improved.  Given zone magnet and weight charts, discussed how to use and what to report.  Also discussed fluid restriction and if he uses ice- one cup of ice is equal to 4 ounces of water.  By teach back went over weights and what to report, along with changes in symptoms.  Spent greater than 20 minutes with patient.  Tresa Endo RN [facility]

## 2023-03-12 DIAGNOSIS — I509 Heart failure, unspecified: Secondary | ICD-10-CM | POA: Diagnosis not present

## 2023-03-12 DIAGNOSIS — I5020 Unspecified systolic (congestive) heart failure: Secondary | ICD-10-CM

## 2023-03-12 LAB — BASIC METABOLIC PANEL
Anion gap: 8 (ref 5–15)
BUN: 24 mg/dL — ABNORMAL HIGH (ref 8–23)
CO2: 28 mmol/L (ref 22–32)
Calcium: 7.9 mg/dL — ABNORMAL LOW (ref 8.9–10.3)
Chloride: 102 mmol/L (ref 98–111)
Creatinine, Ser: 1.87 mg/dL — ABNORMAL HIGH (ref 0.61–1.24)
GFR, Estimated: 35 mL/min — ABNORMAL LOW (ref >60)
Glucose, Bld: 100 mg/dL — ABNORMAL HIGH (ref 70–99)
Potassium: 3.5 mmol/L (ref 3.5–5.1)
Sodium: 138 mmol/L (ref 135–145)

## 2023-03-12 LAB — GLUCOSE, CAPILLARY: Glucose-Capillary: 88 mg/dL (ref 70–99)

## 2023-03-12 MED ORDER — TORSEMIDE 20 MG PO TABS: 40.0000 mg | ORAL_TABLET | Freq: Every day | ORAL | Status: DC

## 2023-03-12 NOTE — Plan of Care (Signed)

## 2023-03-12 NOTE — Discharge Summary (Signed)
Physician Discharge Summary   Patient: [name] MRN: [id number] DOB: [birth year]  Admit date:     03/09/2023  Discharge date: 03/12/23  Discharge Physician: Enedina Finner   PCP: Idelia Salm, MD   Recommendations at discharge:   follow-up PCP and one week follow-up with [facility] cardiology in 1 to 2 weeks  Discharge Diagnoses: Principal Problem:   Acute decompensated heart failure (HCC) Active Problems:   Hypokalemia   Hypomagnesemia   HTN (hypertension)   CKD stage 3b, GFR 30-44 ml/min (HCC)   CAD, multiple vessel   Type 2 diabetes mellitus with hyperlipidemia (HCC)   Dyslipidemia   Chronic diastolic heart failure (HCC)   S/P CABG x 5   CAD (coronary artery disease)   Type II diabetes mellitus with renal manifestations (HCC)   Complete heart block (HCC)   Dilated cardiomyopathy (HCC)   Paroxysmal atrial fibrillation (HCC)   Acute on chronic congestive heart failure (HCC)   Paroxysmal A-fib (HCC)   [name] is a 88 year old male with history of ischemic cardiomyopathy, heart failure reduced ejection fraction, paroxysmal atrial fibrillation on Eliquis, CAD status post CABG x 5 vessel in October 2021, 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  patient has gained 20 pounds over the last 2 months.   Acute on chronic systolic congestive heart failure ischemic cardiomyopathy hypertensive heart disease -- patient came in with increasing shortness of breath leg edema and now on oxygen -- received 80 mg IV lasix in ER --Will continue on IV Lasix 40 mg BID, monitor creatinine,input output -- [facility] cardiology input noted -- continue Coreg, hydralazine, Farxiga, nitrates,Imdur and enteresto --Echo feb 2024--showed EF 30-35% Left ventricular ejection  fraction, by estimation, is 30 to 35%. The left ventricle has moderately decreased function. The left ventricle  demonstrates global hypokinesis with severe hypokinesis of the anterior  wall. The left ventricular internal cavity size was mildly dilated. There is moderate asymmetric left ventricular hypertrophy of the septal segment.  --UOP 13.4 L so far --change to po torsemide and KCL   Chronic kidney disease stage IIIB -- creatinine stable -- monitor while on diuretics   Diabetes with renal manifestation -- sliding scale insulin   [name] -- continue PPI   CAD status post CABG history of V. fib arrest in the setting of MI -- continue nitrates, antiplatelet agent and statins --few NSVT runs--pt asymptomatic  Obesity -- BMI 36.1 -- diet and lifestyle discussed   Overall improving clinically.on RA. Stable. D/c home pt agreeable     Consultants: [facility] cards Procedures performed: none  Disposition: Home Diet recommendation:  Discharge Diet Orders (From admission, onward)     Start     Ordered   03/12/23 0000  Diet - low sodium heart healthy        03/12/23 1100           Cardiac diet DISCHARGE MEDICATION: Allergies as of 03/12/2023       Reactions   Angiotensin Receptor Blockers    hyperkalemia   Spironolactone    Hyperkalemia   Metformin Diarrhea        Medication List     TAKE these medications    acetaminophen 500 MG tablet Commonly known as: TYLENOL Take 1,000 mg by mouth every 6 (six) hours as needed for mild pain, fever or headache.   acyclovir  800 MG tablet Commonly known as: ZOVIRAX Take 1 tablet by mouth daily.   albuterol 108 (90 Base) MCG/ACT inhaler Commonly known as: VENTOLIN HFA Inhale 2 puffs into the lungs every 6 (six) hours as needed for wheezing or shortness of breath.   apixaban 2.5 MG Tabs tablet Commonly known as: Eliquis Take 1 tablet (2.5 mg total) by mouth 2 (two) times daily.   carvedilol 6.25 MG tablet Commonly known  as: COREG Take 1 tablet (6.25 mg total) by mouth 2 (two) times daily with a meal.   diclofenac Sodium 1 % Gel Commonly known as: VOLTAREN Apply 2 g topically 4 (four) times daily.   Farxiga 10 MG Tabs tablet Generic drug: dapagliflozin propanediol Take 10 mg by mouth daily.   fluticasone 110 MCG/ACT inhaler Commonly known as: FLOVENT HFA Inhale 1 puff into the lungs daily.   hydrALAZINE 25 MG tablet Commonly known as: APRESOLINE Take 25 mg by mouth 2 times daily at 12 noon and 4 pm.   insulin degludec 100 UNIT/ML FlexTouch Pen Commonly known as: TRESIBA Inject 0.15 mLs into the skin at bedtime.   isosorbide mononitrate 30 MG 24 hr tablet Commonly known as: IMDUR TAKE 1 TABLET BY MOUTH EVERY DAY   magnesium oxide 400 MG tablet Commonly known as: MAG-OX Take 1 tablet (400 mg total) by mouth daily.   metolazone 2.5 MG tablet Commonly known as: ZAROXOLYN TAKE 1 TABLET (2.5) MG 1 HOUR BEFORE LASIX WEEKLY ON TUESDAYS What changed: See the new instructions.   pantoprazole 40 MG tablet Commonly known as: PROTONIX Take 40 mg by mouth 2 (two) times daily.   polyethylene glycol 17 g packet Commonly known as: MIRALAX / GLYCOLAX Take 17 g by mouth daily as needed for moderate constipation.   Potassium Chloride ER 20 MEQ Tbcr Take 40 mEq by mouth daily.   rosuvastatin 40 MG tablet Commonly known as: CRESTOR TAKE 1 TABLET BY MOUTH EVERY DAY   sacubitril-valsartan 97-103 MG Commonly known as: ENTRESTO Take 1 tablet by mouth 2 (two) times daily.   torsemide 20 MG tablet Commonly known as: DEMADEX Take 2 tablets (40 mg total) by mouth daily.        Follow-up Information     [facility], Rondel Baton, MD. Schedule an appointment as soon as possible for a visit in 1 week(s).   Specialty: Internal Medicine Contact information: [address] [place] [address] [phone]         Antonieta Iba, MD. Schedule an appointment as soon as possible for a visit in  1 week(s).   Specialty: Cardiology Why: CHF f/u Contact information: [address] [place] [address] [phone]                Discharge Exam: [name] Weights   03/09/23 1235 03/11/23 0541  Weight: 108 kg 100.7 kg   GENERAL:  88 y.o.-year-old patient with no acute distress.  LUNGS: Normal breath sounds bilaterally, no wheezing CARDIOVASCULAR: S1, S2 normal. No murmur   ABDOMEN: Soft, nontender, nondistended. Bowel sounds present.  EXTREMITIES: +edema b/l.    NEUROLOGIC: nonfocal  patient is alert and awake SKIN: No obvious rash, lesion, or ulcer.   Condition at discharge: fair  The results of significant diagnostics from this hospitalization (including imaging, microbiology, ancillary and laboratory) are listed below for reference.   Imaging Studies: DG Chest 2 View  Result Date: 03/09/2023 CLINICAL DATA:  Shortness of breath.  Fluid retention. EXAM: CHEST - 2 VIEW  COMPARISON:  Two-view chest x-ray 12/15/2022 FINDINGS: The heart is enlarged, stable. Pacing wires are stable. Mild edema and small effusions are less severe than on the prior study. The visualized soft tissues and bony thorax are unremarkable. IMPRESSION: Cardiomegaly with mild edema and small effusions compatible with congestive heart failure. Electronically Signed   By: Marin Roberts M.D.   On: 03/09/2023 13:49    Microbiology: Results for orders placed or performed during the hospital encounter of 12/15/22  Resp panel by RT-PCR (RSV, Flu A&B, Covid) Anterior Nasal Swab     Status: None   Collection Time: 12/15/22  6:30 PM   Specimen: Anterior Nasal Swab  Result Value Ref Range Status   SARS Coronavirus 2 by RT PCR NEGATIVE NEGATIVE Final    Comment: (NOTE) SARS-CoV-2 target nucleic acids are NOT DETECTED.  The SARS-CoV-2 RNA is generally detectable in upper respiratory specimens during the acute phase of infection. The lowest concentration of SARS-CoV-2 viral copies this assay  can detect is 138 copies/mL. A negative result does not preclude SARS-Cov-2 infection and should not be used as the sole basis for treatment or other patient management decisions. A negative result may occur with  improper specimen collection/handling, submission of specimen other than nasopharyngeal swab, presence of viral mutation(s) within the areas targeted by this assay, and inadequate number of viral copies(<138 copies/mL). A negative result must be combined with clinical observations, patient history, and epidemiological information. The expected result is Negative.  Fact Sheet for Patients:  BloggerCourse.com  Fact Sheet for Healthcare Providers:  SeriousBroker.it  This test is no t yet approved or cleared by the [place] FDA and  has been authorized for detection and/or diagnosis of SARS-CoV-2 by FDA under an Emergency Use Authorization (EUA). This EUA will remain  in effect (meaning this test can be used) for the duration of the COVID-19 declaration under Section 564(b)(1) of the Act, 21 U.S.C.section 360bbb-3(b)(1), unless the authorization is terminated  or revoked sooner.       Influenza A by PCR NEGATIVE NEGATIVE Final   Influenza B by PCR NEGATIVE NEGATIVE Final    Comment: (NOTE) The Xpert Xpress SARS-CoV-2/FLU/RSV plus assay is intended as an aid in the diagnosis of influenza from Nasopharyngeal swab specimens and should not be used as a sole basis for treatment. Nasal washings and aspirates are unacceptable for Xpert Xpress SARS-CoV-2/FLU/RSV testing.  Fact Sheet for Patients: BloggerCourse.com  Fact Sheet for Healthcare Providers: SeriousBroker.it  This test is not yet approved or cleared by the [place] FDA and has been authorized for detection and/or diagnosis of SARS-CoV-2 by FDA under an Emergency Use Authorization (EUA). This EUA will  remain in effect (meaning this test can be used) for the duration of the COVID-19 declaration under Section 564(b)(1) of the Act, 21 U.S.C. section 360bbb-3(b)(1), unless the authorization is terminated or revoked.     Resp Syncytial Virus by PCR NEGATIVE NEGATIVE Final    Comment: (NOTE) Fact Sheet for Patients: BloggerCourse.com  Fact Sheet for Healthcare Providers: SeriousBroker.it  This test is not yet approved or cleared by the [place] FDA and has been authorized for detection and/or diagnosis of SARS-CoV-2 by FDA under an Emergency Use Authorization (EUA). This EUA will remain in effect (meaning this test can be used) for the duration of the COVID-19 declaration under Section 564(b)(1) of the Act, 21 U.S.C. section 360bbb-3(b)(1), unless the authorization is terminated or revoked.  Performed at [facility], [address], [place], [place] [address]  Labs: CBC: Recent Labs  Lab 03/09/23 1252 03/10/23 0546  WBC 3.6* 3.9*  HGB 12.7* 13.5  HCT 41.4 43.8  MCV 87.7 88.0  PLT 136* 136*   Basic Metabolic Panel: Recent Labs  Lab 03/09/23 1252 03/09/23 1824 03/10/23 0546 03/10/23 1530 03/11/23 0409 03/12/23 0333  NA 139  --  140  --  139 138  K 2.9* 4.1 2.9* 3.3* 3.2* 3.5  CL 106  --  104  --  101 102  CO2 22  --  26  --  28 28  GLUCOSE 223*  --  140*  --  113* 100*  BUN 28*  --  24*  --  24* 24*  CREATININE 2.01*  --  1.80*  --  1.86* 1.87*  CALCIUM 7.3*  --  7.8*  --  7.7* 7.9*  MG 1.4*  --  1.7  --  1.7  --    CBG: Recent Labs  Lab 03/11/23 0859 03/11/23 1143 03/11/23 1800 03/11/23 1955 03/12/23 0839  GLUCAP 155* 229* 124* 133* 88    Discharge time spent: greater than 30 minutes.  Signed: Enedina Finner, MD [facility] 03/12/2023

## 2023-03-12 NOTE — Progress Notes (Signed)
Rounding Note    Patient Name: [name] Date of Encounter: 03/12/2023  [facility] Cardiologist: Lorine Bears, MD   Subjective   Patient seen on AM rounds. States that his breathing feels much closer to baseline. Swelling has improved. -1.8 L output in the last 24 hours.  Inpatient Medications    Scheduled Meds:  acyclovir  800 mg Oral Daily   apixaban  2.5 mg Oral BID   carvedilol  6.25 mg Oral BID WC   dapagliflozin propanediol  10 mg Oral Daily   furosemide  40 mg Intravenous Q12H   hydrALAZINE  25 mg Oral q12n4p   insulin aspart  0-5 Units Subcutaneous QHS   insulin aspart  0-9 Units Subcutaneous TID WC   insulin glargine-yfgn  15 Units Subcutaneous QHS   isosorbide mononitrate  30 mg Oral Daily   pantoprazole  40 mg Oral BID   potassium chloride  40 mEq Oral BID   rosuvastatin  40 mg Oral Daily   sacubitril-valsartan  1 tablet Oral BID   Continuous Infusions:  PRN Meds: acetaminophen **OR** acetaminophen, albuterol, ondansetron **OR** ondansetron (ZOFRAN) IV, polyethylene glycol, senna-docusate   Vital Signs    Vitals:   03/11/23 1952 03/11/23 2333 03/12/23 0357 03/12/23 0842  BP: 111/74 123/66 (!) 108/59 117/81  Pulse: 66 66 (!) 59 64  Resp: 19 17 16 20   Temp: 97.6 F (36.4 C) 98.4 F (36.9 C) 98.2 F (36.8 C) 98.5 F (36.9 C)  TempSrc: Oral Oral Oral   SpO2: 100% 100% 96% 92%  Weight:      Height:        Intake/Output Summary (Last 24 hours) at 03/12/2023 1000 Last data filed at 03/12/2023 4098 Gross per 24 hour  Intake 770 ml  Output 2650 ml  Net -1880 ml      03/11/2023    5:41 AM 03/09/2023   12:35 PM 03/09/2023    9:42 AM  Last 3 Weights  Weight (lbs) 222 lb 0.1 oz 238 lb 237 lb 12.8 oz  Weight (kg) 100.7 kg 107.956 kg 107.865 kg      Telemetry    AV paced rates in the 70's and 80's - Personally Reviewed  ECG    No new tracings - Personally Reviewed  Physical Exam   GEN: No acute distress.   Neck: no JVD Cardiac:  RRR, no murmurs, rubs, or gallops.  Respiratory: Clear to auscultation bilaterally. GI: Soft, nontender, obese, non-distended  MS: 1+ edema to BLE; No deformity. Neuro:  Nonfocal  Psych: Normal affect   Labs    High Sensitivity Troponin:   Recent Labs  Lab 03/09/23 1252 03/09/23 1500  TROPONINIHS 16 17     Chemistry Recent Labs  Lab 03/09/23 1252 03/09/23 1824 03/10/23 0546 03/10/23 1530 03/11/23 0409 03/12/23 0333  NA 139  --  140  --  139 138  K 2.9*   < > 2.9* 3.3* 3.2* 3.5  CL 106  --  104  --  101 102  CO2 22  --  26  --  28 28  GLUCOSE 223*  --  140*  --  113* 100*  BUN 28*  --  24*  --  24* 24*  CREATININE 2.01*  --  1.80*  --  1.86* 1.87*  CALCIUM 7.3*  --  7.8*  --  7.7* 7.9*  MG 1.4*  --  1.7  --  1.7  --   GFRNONAA 32*  --  36*  --  35* 35*  ANIONGAP 11  --  10  --  10 8   < > = values in this interval not displayed.    Lipids No results for input(s): "CHOL", "TRIG", "HDL", "LABVLDL", "LDLCALC", "CHOLHDL" in the last 168 hours.  Hematology Recent Labs  Lab 03/09/23 1252 03/10/23 0546  WBC 3.6* 3.9*  RBC 4.72 4.98  HGB 12.7* 13.5  HCT 41.4 43.8  MCV 87.7 88.0  MCH 26.9 27.1  MCHC 30.7 30.8  RDW 15.2 15.3  PLT 136* 136*   Thyroid No results for input(s): "TSH", "FREET4" in the last 168 hours.  BNP Recent Labs  Lab 03/09/23 1252  BNP 3,080.1*    DDimer No results for input(s): "DDIMER" in the last 168 hours.   Radiology    No results found.  Cardiac Studies   2D echo 12/16/22 1. Left ventricular ejection fraction, by estimation, is 30 to 35%. The  left ventricle has moderately decreased function. The left ventricle  demonstrates global hypokinesis with severe hypokinesis of the anterior  wall. The left ventricular internal  cavity size was mildly dilated. There is moderate asymmetric left  ventricular hypertrophy of the septal segment.   2. Right ventricle is not well visualized. Right ventricular systolic  function is grossly normal.  The right ventricular size is normal.  Tricuspid regurgitation signal is inadequate for assessing PA pressure.   3. Left atrial size was mildly dilated.   4. The mitral valve is normal in structure. Trivial mitral valve  regurgitation. No evidence of mitral stenosis.   5. The aortic valve is normal in structure. Aortic valve regurgitation is  not visualized. Aortic valve sclerosis is present, with no evidence of  aortic valve stenosis.   6. The inferior vena cava is normal in size with greater than 50%  respiratory variability, suggesting right atrial pressure of 3 mmHg  Patient Profile     88 y.o. 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   Assessment & Plan    Acute on chronic HFrEF/ICM -shortness of breath, peripheral edema, and 20 pound weight gain on presentation - -1.8 L output in the last 24 hours -down 16 pounds since admission -remains on room air with unlabored respirations -continued on coreg, Farxiga, and Entresto -Currently on furosemide 40 mg IV bid, will need to transition to orals prior to discharge -continue heart failure education -daily weight, I&O's, low sodium diet  CAD, V-fib arrest, s/p CABG x 5 -remains chest pain free -troponins negative -no ischemic changes noted on EKG -no plans for ischemic work-up  Hypertension -blood pressure 117/81 -remains stable -continued on current medication regimen -vital signs per unit protocol  Hyperlipidemia -continued on statin therapy  Paroxysmal atrial fibrillation -currently paced on telemetry -continued on apixaban 2.5 mg twice daily, coreg 6.25 mg twice daily -continue with telemetry monitoring  Stage III-IV CKD -serum creatinine 1.87 -at  baseline -monitor urine output -daily bmp -avoid nephrotoxic agents where able  Hypokalemia -serum potassium 3.5 -continued on supplements -monitor/trend/replete electrolytes as needed -daily bmp  Type II diabetes -continued on insulin -management per IM  Cardiac pacer in situ -functioning correctly -AV paced on telemetry     For questions or updates, please contact [facility] Please consult [email or web address] for contact info under  Signed, Demarius Archila, NP  03/12/2023, 10:00 AM

## 2023-03-12 NOTE — Consult Note (Signed)
PHARMACY CONSULT NOTE  Pharmacy Consult for Electrolyte Monitoring and Replacement   Recent Labs: Potassium (mmol/L)  Date Value  03/12/2023 3.5  01/06/2014 3.0 (L)   Magnesium (mg/dL)  Date Value  16/08/9603 1.7  01/06/2014 0.9 (L)   Calcium (mg/dL)  Date Value  54/07/8118 7.9 (L)   Calcium, Total (mg/dL)  Date Value  14/78/2956 7.7 (L)   Albumin (g/dL)  Date Value  21/30/8657 2.9 (L)  01/06/2014 3.5   Phosphorus (mg/dL)  Date Value  84/69/6295 2.5   Sodium (mmol/L)  Date Value  03/12/2023 138  02/02/2022 146 (H)  01/06/2014 142    Assessment: 88 year old male, presents to the ED for weight gain from outpatient cardiology clinic. PMH: CAD, CHF, CKD, HLD, HTN, DM. Pt is currently on lasix 40 mg IV BID and entresto. Potassium trending down due to lasix.   Goal of Therapy:  K+ > 4 Mg > 2  Plan:  --Continue Kcl 40 mEq PO BID --Labs tomorrow AM  Tressie Ellis 03/12/2023 7:46 AM

## 2023-03-23 ENCOUNTER — Encounter: Payer: Self-pay | Admitting: Nurse Practitioner

## 2023-03-23 ENCOUNTER — Ambulatory Visit: Payer: Medicare HMO | Attending: Nurse Practitioner | Admitting: Nurse Practitioner

## 2023-03-23 ENCOUNTER — Other Ambulatory Visit
Admission: RE | Admit: 2023-03-23 | Discharge: 2023-03-23 | Disposition: A | Discharge status: Home / Self Care | Payer: Medicare HMO | Source: Ambulatory Visit | Attending: Nurse Practitioner | Admitting: Nurse Practitioner

## 2023-03-23 VITALS — BP 142/85 | HR 67 | Ht 68.0 in | Wt 221.8 lb

## 2023-03-23 DIAGNOSIS — E1122 Type 2 diabetes mellitus with diabetic chronic kidney disease: Secondary | ICD-10-CM | POA: Diagnosis not present

## 2023-03-23 DIAGNOSIS — I10 Essential (primary) hypertension: Secondary | ICD-10-CM | POA: Diagnosis not present

## 2023-03-23 DIAGNOSIS — I5022 Chronic systolic (congestive) heart failure: Secondary | ICD-10-CM

## 2023-03-23 DIAGNOSIS — E785 Hyperlipidemia, unspecified: Secondary | ICD-10-CM

## 2023-03-23 DIAGNOSIS — I480 Paroxysmal atrial fibrillation: Secondary | ICD-10-CM

## 2023-03-23 DIAGNOSIS — I2510 Atherosclerotic heart disease of native coronary artery without angina pectoris: Secondary | ICD-10-CM

## 2023-03-23 DIAGNOSIS — Z794 Long term (current) use of insulin: Secondary | ICD-10-CM

## 2023-03-23 DIAGNOSIS — I255 Ischemic cardiomyopathy: Secondary | ICD-10-CM

## 2023-03-23 DIAGNOSIS — N1832 Chronic kidney disease, stage 3b: Secondary | ICD-10-CM

## 2023-03-23 LAB — BASIC METABOLIC PANEL
Anion gap: 8 (ref 5–15)
BUN: 32 mg/dL — ABNORMAL HIGH (ref 8–23)
CO2: 24 mmol/L (ref 22–32)
Calcium: 7.9 mg/dL — ABNORMAL LOW (ref 8.9–10.3)
Chloride: 108 mmol/L (ref 98–111)
Creatinine, Ser: 2 mg/dL — ABNORMAL HIGH (ref 0.61–1.24)
GFR, Estimated: 32 mL/min — ABNORMAL LOW (ref >60)
Glucose, Bld: 146 mg/dL — ABNORMAL HIGH (ref 70–99)
Potassium: 3.5 mmol/L (ref 3.5–5.1)
Sodium: 140 mmol/L (ref 135–145)

## 2023-03-23 MED ORDER — VERQUVO 2.5 MG PO TABS: 2.5000 mg | ORAL_TABLET | Freq: Every day | ORAL | 3 refills | Status: DC

## 2023-03-23 MED ORDER — VERQUVO 2.5 MG PO TABS: 2.5000 mg | ORAL_TABLET | Freq: Every day | ORAL | 0 refills | Status: DC

## 2023-03-23 MED ORDER — SACUBITRIL-VALSARTAN 97-103 MG PO TABS: 1.0000 | ORAL_TABLET | Freq: Two times a day (BID) | ORAL | 6 refills | Status: DC

## 2023-03-23 NOTE — Patient Instructions (Addendum)
Medication Instructions:  START Verquvo 2.5 mg once daily-samples given  *If you need a refill on your cardiac medications before your next appointment, please call your pharmacy*   Lab Work: Your provider would like for you to have following labs drawn: BMET.   Please go to the [facility] entrance and check in at the front desk.  You do not need an appointment.  They are open from 7am-6 pm.   If you have labs (blood work) drawn today and your tests are completely normal, you will receive your results only by: MyChart Message (if you have MyChart) OR A paper copy in the mail If you have any lab test that is abnormal or we need to change your treatment, we will call you to review the results.   Testing/Procedures: None ordered   Follow-Up: At [facility], you and your health needs are our priority.  As part of our continuing mission to provide you with exceptional heart care, we have created designated Provider Care Teams.  These Care Teams include your primary Cardiologist (physician) and Advanced Practice Providers (APPs -  Physician Assistants and Nurse Practitioners) who all work together to provide you with the care you need, when you need it.  We recommend signing up for the patient portal called "MyChart".  Sign up information is provided on this After Visit Summary.  MyChart is used to connect with patients for Virtual Visits (Telemedicine).  Patients are able to view lab/test results, encounter notes, upcoming appointments, etc.  Non-urgent messages can be sent to your provider as well.   To learn more about what you can do with MyChart, go to [email or web address].    Your next appointment:   Follow up in one month with Dr. Gala Romney or Dr. Shirlee Latch
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
## 2023-03-28 ENCOUNTER — Ambulatory Visit: Payer: Medicare HMO | Attending: Family | Admitting: Family

## 2023-03-28 ENCOUNTER — Encounter: Payer: Self-pay | Admitting: Family

## 2023-03-28 VITALS — BP 124/60 | HR 68 | Wt 222.4 lb

## 2023-03-28 DIAGNOSIS — Z794 Long term (current) use of insulin: Secondary | ICD-10-CM | POA: Diagnosis not present

## 2023-03-28 DIAGNOSIS — E1122 Type 2 diabetes mellitus with diabetic chronic kidney disease: Secondary | ICD-10-CM | POA: Diagnosis present

## 2023-03-28 DIAGNOSIS — I130 Hypertensive heart and chronic kidney disease with heart failure and stage 1 through stage 4 chronic kidney disease, or unspecified chronic kidney disease: Secondary | ICD-10-CM | POA: Insufficient documentation

## 2023-03-28 DIAGNOSIS — E785 Hyperlipidemia, unspecified: Secondary | ICD-10-CM | POA: Insufficient documentation

## 2023-03-28 DIAGNOSIS — I4891 Unspecified atrial fibrillation: Secondary | ICD-10-CM | POA: Diagnosis not present

## 2023-03-28 DIAGNOSIS — Z87891 Personal history of nicotine dependence: Secondary | ICD-10-CM | POA: Diagnosis not present

## 2023-03-28 DIAGNOSIS — Z79899 Other long term (current) drug therapy: Secondary | ICD-10-CM | POA: Diagnosis not present

## 2023-03-28 DIAGNOSIS — Z7901 Long term (current) use of anticoagulants: Secondary | ICD-10-CM | POA: Insufficient documentation

## 2023-03-28 DIAGNOSIS — I2510 Atherosclerotic heart disease of native coronary artery without angina pectoris: Secondary | ICD-10-CM | POA: Insufficient documentation

## 2023-03-28 DIAGNOSIS — I272 Pulmonary hypertension, unspecified: Secondary | ICD-10-CM | POA: Insufficient documentation

## 2023-03-28 DIAGNOSIS — Z950 Presence of cardiac pacemaker: Secondary | ICD-10-CM | POA: Diagnosis not present

## 2023-03-28 DIAGNOSIS — N189 Chronic kidney disease, unspecified: Secondary | ICD-10-CM | POA: Insufficient documentation

## 2023-03-28 DIAGNOSIS — I890 Lymphedema, not elsewhere classified: Secondary | ICD-10-CM | POA: Insufficient documentation

## 2023-03-28 DIAGNOSIS — Z8679 Personal history of other diseases of the circulatory system: Secondary | ICD-10-CM | POA: Insufficient documentation

## 2023-03-28 DIAGNOSIS — E875 Hyperkalemia: Secondary | ICD-10-CM | POA: Diagnosis not present

## 2023-03-28 DIAGNOSIS — Z951 Presence of aortocoronary bypass graft: Secondary | ICD-10-CM | POA: Diagnosis not present

## 2023-03-28 DIAGNOSIS — I480 Paroxysmal atrial fibrillation: Secondary | ICD-10-CM

## 2023-03-28 DIAGNOSIS — I5022 Chronic systolic (congestive) heart failure: Secondary | ICD-10-CM | POA: Insufficient documentation

## 2023-03-28 DIAGNOSIS — N1832 Chronic kidney disease, stage 3b: Secondary | ICD-10-CM

## 2023-03-28 DIAGNOSIS — I10 Essential (primary) hypertension: Secondary | ICD-10-CM | POA: Diagnosis not present

## 2023-03-28 NOTE — Patient Instructions (Signed)
Finish your current verquo bottle of 2.5mg . When you finish this bottle, you will begin the 5mg  dose. This will still be as 1 tablet daily.
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
## 2023-04-12 ENCOUNTER — Ambulatory Visit (INDEPENDENT_AMBULATORY_CARE_PROVIDER_SITE_OTHER): Payer: Medicare HMO

## 2023-04-12 DIAGNOSIS — I453 Trifascicular block: Secondary | ICD-10-CM

## 2023-04-12 LAB — CUP PACEART REMOTE DEVICE CHECK
Battery Remaining Longevity: 65 mo
Battery Remaining Percentage: 74 %
Battery Voltage: 2.99 V
Brady Statistic AP VP Percent: 61 %
Brady Statistic AP VS Percent: 1 %
Brady Statistic AS VP Percent: 37 %
Brady Statistic AS VS Percent: 1 %
Brady Statistic RA Percent Paced: 59 %
Date Time Interrogation Session: 20240604023536
Implantable Lead Connection Status: 753985
Implantable Lead Connection Status: 753985
Implantable Lead Connection Status: 753985
Implantable Lead Implant Date: 20221205
Implantable Lead Implant Date: 20221205
Implantable Lead Implant Date: 20221205
Implantable Lead Location: 753858
Implantable Lead Location: 753859
Implantable Lead Location: 753860
Implantable Lead Model: 3830
Implantable Pulse Generator Implant Date: 20221205
Lead Channel Impedance Value: 410 Ohm
Lead Channel Impedance Value: 410 Ohm
Lead Channel Impedance Value: 480 Ohm
Lead Channel Pacing Threshold Amplitude: 0.5 V
Lead Channel Pacing Threshold Amplitude: 0.875 V
Lead Channel Pacing Threshold Amplitude: 1.875 V
Lead Channel Pacing Threshold Pulse Width: 0.5 ms
Lead Channel Pacing Threshold Pulse Width: 0.5 ms
Lead Channel Pacing Threshold Pulse Width: 0.5 ms
Lead Channel Sensing Intrinsic Amplitude: 12 mV
Lead Channel Sensing Intrinsic Amplitude: 2.8 mV
Lead Channel Setting Pacing Amplitude: 2 V
Lead Channel Setting Pacing Amplitude: 2 V
Lead Channel Setting Pacing Amplitude: 3.375
Lead Channel Setting Pacing Pulse Width: 0.5 ms
Lead Channel Setting Pacing Pulse Width: 0.5 ms
Lead Channel Setting Sensing Sensitivity: 2 mV
Pulse Gen Model: 3222
Pulse Gen Serial Number: 3901949

## 2023-04-18 ENCOUNTER — Encounter: Payer: Self-pay | Admitting: Family

## 2023-04-18 ENCOUNTER — Ambulatory Visit (HOSPITAL_BASED_OUTPATIENT_CLINIC_OR_DEPARTMENT_OTHER): Payer: Medicare HMO | Admitting: Family

## 2023-04-18 ENCOUNTER — Other Ambulatory Visit
Admission: RE | Admit: 2023-04-18 | Discharge: 2023-04-18 | Disposition: A | Discharge status: Home / Self Care | Payer: Medicare HMO | Source: Ambulatory Visit | Attending: Family | Admitting: Family

## 2023-04-18 VITALS — BP 134/75 | HR 60 | Wt 222.4 lb

## 2023-04-18 DIAGNOSIS — N1832 Chronic kidney disease, stage 3b: Secondary | ICD-10-CM

## 2023-04-18 DIAGNOSIS — Z794 Long term (current) use of insulin: Secondary | ICD-10-CM

## 2023-04-18 DIAGNOSIS — I480 Paroxysmal atrial fibrillation: Secondary | ICD-10-CM

## 2023-04-18 DIAGNOSIS — I2510 Atherosclerotic heart disease of native coronary artery without angina pectoris: Secondary | ICD-10-CM | POA: Diagnosis not present

## 2023-04-18 DIAGNOSIS — E1122 Type 2 diabetes mellitus with diabetic chronic kidney disease: Secondary | ICD-10-CM

## 2023-04-18 DIAGNOSIS — I5022 Chronic systolic (congestive) heart failure: Secondary | ICD-10-CM | POA: Insufficient documentation

## 2023-04-18 DIAGNOSIS — I890 Lymphedema, not elsewhere classified: Secondary | ICD-10-CM

## 2023-04-18 DIAGNOSIS — I10 Essential (primary) hypertension: Secondary | ICD-10-CM | POA: Diagnosis not present

## 2023-04-18 LAB — BASIC METABOLIC PANEL
Anion gap: 9 (ref 5–15)
BUN: 37 mg/dL — ABNORMAL HIGH (ref 8–23)
CO2: 26 mmol/L (ref 22–32)
Calcium: 8.3 mg/dL — ABNORMAL LOW (ref 8.9–10.3)
Chloride: 108 mmol/L (ref 98–111)
Creatinine, Ser: 1.73 mg/dL — ABNORMAL HIGH (ref 0.61–1.24)
GFR, Estimated: 38 mL/min — ABNORMAL LOW (ref >60)
Glucose, Bld: 149 mg/dL — ABNORMAL HIGH (ref 70–99)
Potassium: 3.7 mmol/L (ref 3.5–5.1)
Sodium: 143 mmol/L (ref 135–145)

## 2023-04-18 LAB — BRAIN NATRIURETIC PEPTIDE: B Natriuretic Peptide: 1820.1 pg/mL — ABNORMAL HIGH (ref 0.0–100.0)

## 2023-04-18 MED ORDER — VERQUVO 10 MG PO TABS: 10.0000 mg | ORAL_TABLET | Freq: Every day | ORAL | 5 refills | Status: DC

## 2023-04-18 NOTE — Patient Instructions (Signed)
Finish your current bottle of verquvo and when you get the new bottle, it will be the 10mg  dose. You will continue to take this as 1 tablet every day.
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
## 2023-05-05 NOTE — Progress Notes (Signed)
Remote pacemaker transmission.   

## 2023-05-10 DIAGNOSIS — Z950 Presence of cardiac pacemaker: Secondary | ICD-10-CM | POA: Insufficient documentation

## 2023-05-10 NOTE — Progress Notes (Unsigned)
Cardiology Office Note Date:  05/11/2023  Patient ID:  [name] [birth year], MRN [id number] PCP:  Idelia Salm, MD  Cardiologist:  Lorine Bears, MD HF Cardiologist: Willow Ora (has not met MD yet) Electrophysiologist: Lanier Prude, MD   Chief Complaint: 1 year PPM follow-up, past due  History of Present Illness: [name] is a 88 y.o. male with PMH notable for CAD, VF arrest, found to have severe CAD, s/p CABG x 5HF, RBBB, CHB s/p CRT-P, HTN, parox AFib; seen today for Lanier Prude, MD for routine electrophysiology followup.  He last saw Dr. Lalla Brothers 01/2022 for routine 91d post-implant appt.  Parox AFib diagnosed by device He has followed-up regularly with general and HF cardiologist, last appt with [facility] 04/18/2023.   On follow up today, he denies palpitations, chest pain, chest pressure.  He does have some shortness of breath with activity, this is stable. He diligently takes torsemide twice per day and metolazone on Tuesdays.  Took metolazone yesterday.  He is able to sleep flat in bed, good appetite, no PND  Diligently takes Eliquis 2 times a day, no bleeding concerns  Device Information: St. Jude CRT-P, imp 10/2021; dx CHB, HFrEF  AAD History: none  Past Medical History:  Diagnosis Date   AV block, Mobitz 1    CAD (coronary artery disease)    a. 08/2019 VF arrest-->sev 3vd on cath-->CABGx5 (LIMA->LAD, VG->OM->LCX, VG->RPDA, VG->Diag); b. 08/2021 Cath: sev native multivessel dzs w/ 5/5 patent grafts. Nl filling pressures->Med rx.   CKD (chronic kidney disease), stage III (HCC)    Diabetes mellitus without complication (HCC)    HFrEF (heart failure with reduced ejection fraction) (HCC)    a. 08/2020 Echo: EF 40-45%; b. 11/2020 Echo: EF 55%, Gr2 DD; c. 08/2021 Echo: EF 35-40%, glob HK w/ ant/antsept/apical HK; d. 10/2021 s/p CRT-P; e. 12/2021 Echo: EF 30-35%, glob HK, GrIII DD; f. 12/2022 Echo: EF 30-35%, glob HK, sev HK of ant wall, mod  asymm LVH, nl RV fxn, mildly dil LA, triv MR, AoV sclerosis.   Hyperlipidemia LDL goal <70    Hypertension    Intermittent Complete heart block (HCC)    a. 08/2021 noted on Zio; b. 10/2021 s/p Abbott Allure RF CRT-P (Ser # [id number]).   Ischemic cardiomyopathy    a. 08/2020 Echo: EF 40-45%; b. 11/2020 Echo: EF 55%; c. 08/2021 Echo: EF 35-40%; d. 10/2021 s/p CRT-P; e. 12/2021 Echo: EF 30-35%, glob HK, GrIII DD.   PAF (paroxysmal atrial fibrillation) (HCC)    a.Medical device check-->Eliquis (CHA2DS2VASc = 6)   Trifascicular block     Past Surgical History:  Procedure Laterality Date   BACK SURGERY     CARDIAC CATHETERIZATION     CLIPPING OF ATRIAL APPENDAGE N/A 08/29/2020   Procedure: CLIPPING OF ATRIAL APPENDAGE USING ATRICURE 45 MM ATRICLIP FLEX-V;  Surgeon: Delight Ovens, MD;  Location: [facility] OR;  Service: Open Heart Surgery;  Laterality: N/A;   CORONARY ARTERY BYPASS GRAFT N/A 08/29/2020   Procedure: CORONARY ARTERY BYPASS GRAFTING (CABG) X 5 USING LEFT INTERNAL MAMMARY ARTERY AND ENDOSCOPICALLY HARVESTED RIGHT GREATER SAPHENOUS VEIN. LIMA TO LAD, SVG TO OM SEQ TO CIRC, SVG TO PD, SVG TO DIAG.;  Surgeon: Delight Ovens, MD;  Location: [facility] OR;  Service: Open Heart Surgery;  Laterality: N/A;   ENDOVEIN HARVEST OF GREATER SAPHENOUS VEIN Right 08/29/2020   Procedure: ENDOVEIN HARVEST OF GREATER SAPHENOUS VEIN;  Surgeon: Delight Ovens, MD;  Location: [facility] OR;  Service: Open Heart Surgery;  Laterality: Right;   HERNIA REPAIR     LEFT HEART CATH AND CORONARY ANGIOGRAPHY N/A 08/25/2020   Procedure: LEFT HEART CATH AND CORONARY ANGIOGRAPHY;  Surgeon: Iran Ouch, MD;  Location: [facility] INVASIVE CV LAB;  Service: Cardiovascular;  Laterality: N/A;   PACEMAKER IMPLANT N/A 10/12/2021   Procedure: PACEMAKER IMPLANT;  Surgeon: Lanier Prude, MD;  Location: [facility] INVASIVE CV LAB;  Service: Cardiovascular;  Laterality: N/A;   RIGHT/LEFT HEART CATH AND CORONARY ANGIOGRAPHY N/A 08/31/2021    Procedure: RIGHT/LEFT HEART CATH AND CORONARY ANGIOGRAPHY;  Surgeon: Iran Ouch, MD;  Location: [facility] INVASIVE CV LAB;  Service: Cardiovascular;  Laterality: N/A;   TEE WITHOUT CARDIOVERSION N/A 08/29/2020   Procedure: TRANSESOPHAGEAL ECHOCARDIOGRAM (TEE);  Surgeon: Delight Ovens, MD;  Location: [facility] OR;  Service: Open Heart Surgery;  Laterality: N/A;    Current Outpatient Medications  Medication Instructions   acetaminophen (TYLENOL) 1,000 mg, Oral, Every 6 hours PRN   acyclovir (ZOVIRAX) 800 MG tablet 1 tablet, Oral, Daily   albuterol (VENTOLIN HFA) 108 (90 Base) MCG/ACT inhaler 2 puffs, Inhalation, Every 6 hours PRN   apixaban (ELIQUIS) 2.5 mg, Oral, 2 times daily   carvedilol (COREG) 6.25 mg, Oral, 2 times daily with meals   dapagliflozin propanediol (FARXIGA) 10 mg, Oral, Daily   diclofenac Sodium (VOLTAREN) 2 g, Topical, 4 times daily   fluticasone (FLOVENT HFA) 110 MCG/ACT inhaler 1 puff, Inhalation, Daily   hydrALAZINE (APRESOLINE) 25 mg, Oral, 2 times daily   insulin degludec (TRESIBA) 100 UNIT/ML FlexTouch Pen 0.15 mLs, Subcutaneous, Nightly   isosorbide mononitrate (IMDUR) 30 mg, Oral, Daily   magnesium oxide (MAG-OX) 400 mg, Oral, Daily   metolazone (ZAROXOLYN) 2.5 MG tablet TAKE 1 TABLET (2.5) MG 1 HOUR BEFORE LASIX WEEKLY ON TUESDAYS   pantoprazole (PROTONIX) 40 mg, Oral, 2 times daily   polyethylene glycol (MIRALAX / GLYCOLAX) 17 g, Oral, Daily PRN   Potassium Chloride ER 20 MEQ TBCR 40 mEq, Oral, Daily   rosuvastatin (CRESTOR) 40 MG tablet TAKE 1 TABLET BY MOUTH EVERY DAY   sacubitril-valsartan (ENTRESTO) 97-103 MG 1 tablet, Oral, 2 times daily   torsemide (DEMADEX) 40 mg, Oral, Daily   Verquvo 10 mg, Oral, Daily    Social History:  The patient  reports that he quit smoking about 26 years ago. His smoking use included cigarettes. He has never used smokeless tobacco. He reports that he does not currently use alcohol. He reports that he does not use drugs.    Family History:  The patient's family history includes Heart attack in his mother.  ROS:  Please see the history of present illness. All other systems are reviewed and otherwise negative.   PHYSICAL EXAM:  VS:  BP 132/80 (BP Location: Right Arm, Patient Position: Sitting, Cuff Size: Normal)   Pulse 60   Ht 5\' 8"  (1.727 m)   Wt 222 lb (100.7 kg)   SpO2 98%   BMI 33.75 kg/m  BMI: Body mass index is 33.75 kg/m.  GEN- The patient is well appearing, alert and oriented x 3 today.   Lungs- Clear to ausculation bilaterally, normal work of breathing.  Heart- Regular rate and rhythm, no murmurs, rubs or gallops Extremities- 1+ woody edema, warm, dry Skin-   device pocket well-healed, no tethering   Device interrogation done today and reviewed by myself:  Battery 5+ years Lead thresholds, impedence, sensing stable  Slightly elevated atrial threshold at 2V at 0.13ms Dependent down to VVI  30 BiV pacing 98% Freq, brief AMS episodes - afib and aflutter Overall AF burden < 1% CorVue stable No changes made today  EKG is not ordered. Personal review of EKG from  03/23/2023  shows:  Intermittent AP, BiV paced, rate 67bpm        Recent Labs: 03/10/2023: Hemoglobin 13.5; Platelets 136 03/11/2023: Magnesium 1.7 04/18/2023: B Natriuretic Peptide 1,820.1; BUN 37; Creatinine, Ser 1.73; Potassium 3.7; Sodium 143  No results found for requested labs within last 365 days.   CrCl cannot be calculated (Patient's most recent lab result is older than the maximum 21 days allowed.).   Wt Readings from Last 3 Encounters:  05/11/23 222 lb (100.7 kg)  04/18/23 222 lb 6.4 oz (100.9 kg)  03/28/23 222 lb 6.4 oz (100.9 kg)     Additional studies reviewed include: Previous EP, cardiology notes.   TTE, 12/16/2022  1. Left ventricular ejection fraction, by estimation, is 30 to 35%. The  left ventricle has moderately decreased function. The left ventricle demonstrates global hypokinesis with severe hypokinesis  of the anterior wall. The left ventricular internal cavity size was mildly dilated. There is moderate asymmetric left ventricular hypertrophy of the septal segment.   2. Right ventricle is not well visualized. Right ventricular systolic function is grossly normal. The right ventricular size is normal. Tricuspid regurgitation signal is inadequate for assessing PA pressure.   3. Left atrial size was mildly dilated.   4. The mitral valve is normal in structure. Trivial mitral valve regurgitation. No evidence of mitral stenosis.   5. The aortic valve is normal in structure. Aortic valve regurgitation is not visualized. Aortic valve sclerosis is present, with no evidence of aortic valve stenosis.   6. The inferior vena cava is normal in size with greater than 50% respiratory variability, suggesting right atrial pressure of 3 mmHg.    ASSESSMENT AND PLAN:  #) CHB s/p St. Jude CRT-P Device functioning well, see paceart for details Atrial threshold slightly elevated though still within safety margin, cont to monitor via remotes  #) parox AFib No cardiac awareness AF burden <1% by device No indication currently for AAD Not ablation candidate CHA2DS2-VASc Score = 6  NOAC - 2.5mg  eliquis BID, dose appropriately reduced d/t age, Cr No bleeding concerns  #) HFrEF NYHA III symptoms Warm on exam with chronic-appearing lower extremity edema Follows regularly with HF team, has appt next week with Dr. Shirlee Latch GDMT: entresto 97-103, coreg, farxiga Diuretic: torsemide 40mg  BID + metolazone on Tuesdays      Current medicines are reviewed at length with the patient today.   The patient does not have concerns regarding his medicines.  The following changes were made today:  none  Labs/ tests ordered today include:  No orders of the defined types were placed in this encounter.    Disposition: Follow up with Dr. Lalla Brothers or EP APP in 12 months   Signed, Sherie Don, NP  05/11/23  11:27 AM   Electrophysiology [facility]

## 2023-05-11 ENCOUNTER — Ambulatory Visit: Payer: Medicare HMO | Attending: Cardiology | Admitting: Cardiology

## 2023-05-11 ENCOUNTER — Encounter: Payer: Self-pay | Admitting: Cardiology

## 2023-05-11 VITALS — BP 132/80 | HR 60 | Ht 68.0 in | Wt 222.0 lb

## 2023-05-11 DIAGNOSIS — I480 Paroxysmal atrial fibrillation: Secondary | ICD-10-CM | POA: Diagnosis not present

## 2023-05-11 DIAGNOSIS — D6869 Other thrombophilia: Secondary | ICD-10-CM | POA: Diagnosis not present

## 2023-05-11 DIAGNOSIS — Z950 Presence of cardiac pacemaker: Secondary | ICD-10-CM | POA: Diagnosis not present

## 2023-05-11 DIAGNOSIS — I442 Atrioventricular block, complete: Secondary | ICD-10-CM

## 2023-05-11 LAB — CUP PACEART INCLINIC DEVICE CHECK
Battery Remaining Longevity: 61 mo
Battery Voltage: 2.99 V
Brady Statistic RA Percent Paced: 59 %
Brady Statistic RV Percent Paced: 98 %
Date Time Interrogation Session: 20240703122751
Implantable Lead Connection Status: 753985
Implantable Lead Connection Status: 753985
Implantable Lead Connection Status: 753985
Implantable Lead Implant Date: 20221205
Implantable Lead Implant Date: 20221205
Implantable Lead Implant Date: 20221205
Implantable Lead Location: 753858
Implantable Lead Location: 753859
Implantable Lead Location: 753860
Implantable Lead Model: 3830
Implantable Pulse Generator Implant Date: 20221205
Lead Channel Impedance Value: 412.5 Ohm
Lead Channel Impedance Value: 412.5 Ohm
Lead Channel Impedance Value: 475 Ohm
Lead Channel Pacing Threshold Amplitude: 0.75 V
Lead Channel Pacing Threshold Amplitude: 0.75 V
Lead Channel Pacing Threshold Amplitude: 0.75 V
Lead Channel Pacing Threshold Amplitude: 0.75 V
Lead Channel Pacing Threshold Amplitude: 2 V
Lead Channel Pacing Threshold Amplitude: 2 V
Lead Channel Pacing Threshold Pulse Width: 0.5 ms
Lead Channel Pacing Threshold Pulse Width: 0.5 ms
Lead Channel Pacing Threshold Pulse Width: 0.5 ms
Lead Channel Pacing Threshold Pulse Width: 0.5 ms
Lead Channel Pacing Threshold Pulse Width: 0.5 ms
Lead Channel Pacing Threshold Pulse Width: 0.5 ms
Lead Channel Sensing Intrinsic Amplitude: 12 mV
Lead Channel Sensing Intrinsic Amplitude: 2.9 mV
Lead Channel Setting Pacing Amplitude: 2 V
Lead Channel Setting Pacing Amplitude: 2 V
Lead Channel Setting Pacing Amplitude: 2.5 V
Lead Channel Setting Pacing Pulse Width: 0.5 ms
Lead Channel Setting Pacing Pulse Width: 0.5 ms
Lead Channel Setting Sensing Sensitivity: 2 mV
Pulse Gen Model: 3222
Pulse Gen Serial Number: 3901949

## 2023-05-11 NOTE — Patient Instructions (Signed)
Medication Instructions:  Your physician recommends that you continue on your current medications as directed. Please refer to the Current Medication list given to you today.  *If you need a refill on your cardiac medications before your next appointment, please call your pharmacy*  Lab Work: No labs ordered  If you have labs (blood work) drawn today and your tests are completely normal, you will receive your results only by: MyChart Message (if you have MyChart) OR A paper copy in the mail If you have any lab test that is abnormal or we need to change your treatment, we will call you to review the results.  Testing/Procedures: No testing ordered  Follow-Up: At [facility], you and your health needs are our priority.  As part of our continuing mission to provide you with exceptional heart care, we have created designated Provider Care Teams.  These Care Teams include your primary Cardiologist (physician) and Advanced Practice Providers (APPs -  Physician Assistants and Nurse Practitioners) who all work together to provide you with the care you need, when you need it.  We recommend signing up for the patient portal called "MyChart".  Sign up information is provided on this After Visit Summary.  MyChart is used to connect with patients for Virtual Visits (Telemedicine).  Patients are able to view lab/test results, encounter notes, upcoming appointments, etc.  Non-urgent messages can be sent to your provider as well.   To learn more about what you can do with MyChart, go to [email or web address].    Your next appointment:   1 year(s)  Provider:   Cameron Lambert, MD or Suzann Riddle, NP 

## 2023-05-19 ENCOUNTER — Other Ambulatory Visit
Admission: RE | Admit: 2023-05-19 | Discharge: 2023-05-19 | Disposition: A | Discharge status: Home / Self Care | Payer: Medicare HMO | Source: Ambulatory Visit | Attending: Cardiology | Admitting: Cardiology

## 2023-05-19 ENCOUNTER — Ambulatory Visit (HOSPITAL_BASED_OUTPATIENT_CLINIC_OR_DEPARTMENT_OTHER): Payer: Medicare HMO | Admitting: Cardiology

## 2023-05-19 VITALS — BP 124/69 | HR 60 | Ht 68.0 in | Wt 216.0 lb

## 2023-05-19 DIAGNOSIS — I5022 Chronic systolic (congestive) heart failure: Secondary | ICD-10-CM

## 2023-05-19 LAB — BASIC METABOLIC PANEL
Anion gap: 10 (ref 5–15)
BUN: 35 mg/dL — ABNORMAL HIGH (ref 8–23)
CO2: 25 mmol/L (ref 22–32)
Calcium: 8.4 mg/dL — ABNORMAL LOW (ref 8.9–10.3)
Chloride: 106 mmol/L (ref 98–111)
Creatinine, Ser: 1.81 mg/dL — ABNORMAL HIGH (ref 0.61–1.24)
GFR, Estimated: 36 mL/min — ABNORMAL LOW (ref >60)
Glucose, Bld: 201 mg/dL — ABNORMAL HIGH (ref 70–99)
Potassium: 3.7 mmol/L (ref 3.5–5.1)
Sodium: 141 mmol/L (ref 135–145)

## 2023-05-19 MED ORDER — SPIRONOLACTONE 25 MG PO TABS: 12.5000 mg | ORAL_TABLET | Freq: Every day | ORAL | 3 refills | Status: DC

## 2023-05-19 MED ORDER — POTASSIUM CHLORIDE ER 20 MEQ PO TBCR: 20.0000 meq | EXTENDED_RELEASE_TABLET | Freq: Every day | ORAL | 6 refills | Status: DC
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
## 2023-05-19 NOTE — Patient Instructions (Addendum)
Medication Changes:  Start Spironolactone 12.5 mg (0.5 tablet) daily.  Decrease Potassium to 20 mEq (1 tablet) daily.   Lab Work:  Labs done today, your results will be available in MyChart, we will contact you for abnormal readings.   Testing/Procedures:  You will receive a call from [place] regarding Barostim information.     Special Instructions // Education:  Do the following things EVERYDAY: Weigh yourself in the morning before breakfast. Write it down and keep it in a log. Take your medicines as prescribed Eat low salt foods--Limit salt (sodium) to 2000 mg per day.  Stay as active as you can everyday Limit all fluids for the day to less than 2 liters   Follow-Up in: follow up with Dr. Shirlee Latch.     If you have any questions or concerns before your next appointment please send [place] a message through [place] or call our office at [phone] Monday-Friday 8 am-5 pm.   If you have an urgent need after hours on the weekend please call your Primary Cardiologist or the Advanced Heart Failure Clinic in [place] at [phone].

## 2023-05-20 LAB — MISC LABCORP TEST (SEND OUT): Labcorp test code: 143000

## 2023-05-22 ENCOUNTER — Emergency Department
Admission: EM | Admit: 2023-05-22 | Discharge: 2023-05-22 | Disposition: A | Discharge status: Home / Self Care | Payer: Medicare HMO | Attending: Emergency Medicine | Admitting: Emergency Medicine

## 2023-05-22 ENCOUNTER — Other Ambulatory Visit: Payer: Self-pay

## 2023-05-22 ENCOUNTER — Emergency Department: Payer: Medicare HMO

## 2023-05-22 DIAGNOSIS — R0789 Other chest pain: Secondary | ICD-10-CM | POA: Insufficient documentation

## 2023-05-22 DIAGNOSIS — R079 Chest pain, unspecified: Secondary | ICD-10-CM

## 2023-05-22 DIAGNOSIS — I2510 Atherosclerotic heart disease of native coronary artery without angina pectoris: Secondary | ICD-10-CM | POA: Insufficient documentation

## 2023-05-22 LAB — CBC WITH DIFFERENTIAL/PLATELET
Abs Immature Granulocytes: 0.01 10*3/uL (ref 0.00–0.07)
Basophils Absolute: 0.1 10*3/uL (ref 0.0–0.1)
Basophils Relative: 1 %
Eosinophils Absolute: 0.3 10*3/uL (ref 0.0–0.5)
Eosinophils Relative: 6 %
HCT: 43.1 % (ref 39.0–52.0)
Hemoglobin: 13.3 g/dL (ref 13.0–17.0)
Immature Granulocytes: 0 %
Lymphocytes Relative: 23 %
Lymphs Abs: 1.1 10*3/uL (ref 0.7–4.0)
MCH: 27.8 pg (ref 26.0–34.0)
MCHC: 30.9 g/dL (ref 30.0–36.0)
MCV: 90 fL (ref 80.0–100.0)
Monocytes Absolute: 0.6 10*3/uL (ref 0.1–1.0)
Monocytes Relative: 13 %
Neutro Abs: 2.9 10*3/uL (ref 1.7–7.7)
Neutrophils Relative %: 57 %
Platelets: 156 10*3/uL (ref 150–400)
RBC: 4.79 MIL/uL (ref 4.22–5.81)
RDW: 15.2 % (ref 11.5–15.5)
WBC: 5 10*3/uL (ref 4.0–10.5)
nRBC: 0 % (ref 0.0–0.2)

## 2023-05-22 LAB — BASIC METABOLIC PANEL
Anion gap: 6 (ref 5–15)
BUN: 30 mg/dL — ABNORMAL HIGH (ref 8–23)
CO2: 28 mmol/L (ref 22–32)
Calcium: 8 mg/dL — ABNORMAL LOW (ref 8.9–10.3)
Chloride: 108 mmol/L (ref 98–111)
Creatinine, Ser: 1.82 mg/dL — ABNORMAL HIGH (ref 0.61–1.24)
GFR, Estimated: 36 mL/min — ABNORMAL LOW (ref >60)
Glucose, Bld: 154 mg/dL — ABNORMAL HIGH (ref 70–99)
Potassium: 3.6 mmol/L (ref 3.5–5.1)
Sodium: 142 mmol/L (ref 135–145)

## 2023-05-22 LAB — TROPONIN I (HIGH SENSITIVITY)
Troponin I (High Sensitivity): 14 ng/L (ref <18)
Troponin I (High Sensitivity): 14 ng/L (ref <18)

## 2023-05-22 MED ORDER — NITROGLYCERIN 0.4 MG SL SUBL
0.4000 mg | SUBLINGUAL_TABLET | SUBLINGUAL | Status: DC | PRN
  Administered 2023-05-22: 0.4 mg via SUBLINGUAL
  Filled 2023-05-22: qty 1

## 2023-05-22 MED ORDER — ASPIRIN 81 MG PO CHEW: 324.0000 mg | CHEWABLE_TABLET | Freq: Once | ORAL | Status: DC

## 2023-05-22 NOTE — Discharge Instructions (Addendum)
Patient in the ER today for evaluation of your chest pain.  Your blood work, EKG, and x-[name] were overall reassuring.  As we discussed, I am still concerned that your chest pain may be related to your heart.  It is very important that you follow-up with cardiology closely for further evaluation.  Return to the ER for new or worsening symptoms.

## 2023-05-22 NOTE — ED Provider Notes (Signed)
Care of this patient assumed from prior physician at 0700 pending repeat troponin and disposition. Please see prior physician note for further details.  89 year old male with history of CAD with multivessel CABG in 2021 who presented to the ER with chest tightness.  Received aspirin with EMS.  Initially had chest pain on ER presentation, resolved after receiving nitroglycerin.  His initial troponin was negative.  Several initial EKGs with significant artifact.  I did review the EKG from 645 demonstrating sinus rhythm at a rate of 67, PR 181, QRS 151, no acute ST changes.    Repeat troponin returned within normal limits.  I did obtain a repeat EKG at 838 demonstrating sinus rhythm at a rate of 63, PR 185, QRS 150, QTc 516, no acute ST changes.  Patient was reevaluated.  He denied any recurrent chest pain.  Certainly with cardiac risk factors and I did recommend admission despite his negative troponin.  Patient expressed understanding of his cardiac risk, but did request to be discharged with close outpatient follow-up.  On review of his chart, it does appear that he has been well-established with cardiology as an outpatient with multiple visits within the last several months.  Through shared decision making, patient was discharged with strict return precautions.   Trinna Post, MD 05/22/23 [phone]

## 2023-05-22 NOTE — ED Provider Notes (Signed)
[facility] Provider Note    Event Date/Time   First MD Initiated Contact with Patient 05/22/23 0532     (approximate)   History   Chest pain   HPI  [name] is a 88 y.o. male  who presents to the emergency department today because of concern for chest tightness. The patient says that it woke him from sleep.  He rated it a 10 out of 10.  Located in the central chest.  It did not radiate to his neck or arm.  Patient does have a history of coronary artery disease and the pain is somewhat remind him of his previous episodes of pain.  Was given aspirin by EMS as well as some nitro.  Patient denies any recent illness or unusual activity yesterday.     Physical Exam   Triage Vital Signs: ED Triage Vitals [05/22/23 0517]  Encounter Vitals Group     BP      Systolic BP Percentile      Diastolic BP Percentile      Pulse Rate 66     Resp 20     Temp 98.6 F (37 C)     Temp Source Oral     SpO2 94 %     Weight      Height      Head Circumference      Peak Flow      Pain Score 6     Pain Loc      Pain Education      Exclude from Growth Chart     Most recent vital signs: Vitals:   05/22/23 0517  Pulse: 66  Resp: 20  Temp: 98.6 F (37 C)  SpO2: 94%   General: Awake, alert, oriented. CV:  Good peripheral perfusion. Regular rate and rhythm. Resp:  Normal effort. Lungs clear. Abd:  No distention.    ED Results / Procedures / Treatments   Labs (all labs ordered are listed, but only abnormal results are displayed) Labs Reviewed  BASIC METABOLIC PANEL - Abnormal; Notable for the following components:      Result Value   Glucose, Bld 154 (*)    BUN 30 (*)    Creatinine, Ser 1.82 (*)    Calcium 8.0 (*)    GFR, Estimated 36 (*)    All other components within normal limits  CBC WITH DIFFERENTIAL/PLATELET  TROPONIN I (HIGH SENSITIVITY)     EKG  I, Phineas Semen, attending physician, personally viewed and interpreted this EKG  EKG  Time: 0515 Rate: 82 Rhythm: sinus rhythm Axis: rightward axis Intervals: qtc 532 QRS: wide ST changes: no st elevation Impression: abnormal ekg  I, Phineas Semen, attending physician, personally viewed and interpreted this EKG  EKG Time: 0645 Rate: 67 Rhythm: sinus rhythm Axis: rightward axis Intervals: qtc 537 QRS: wide ST changes: no st elevation Impression: abnormal ekg   RADIOLOGY I independently interpreted and visualized the CXR. My interpretation: No pneumonia Radiology interpretation:  IMPRESSION:  1. No radiographic evidence of acute cardiopulmonary disease.  2. Mild cardiomegaly.  3. Postoperative changes and support apparatus, as above.      PROCEDURES:  Critical Care performed: No    MEDICATIONS ORDERED IN ED: Medications - No data to display   IMPRESSION / MDM / ASSESSMENT AND PLAN / ED COURSE  I reviewed the triage vital signs and the nursing notes.  Differential diagnosis includes, but is not limited to, ACS, pneumonia, dissection, pe, pleurisy, esophagitis  Patient's presentation is most consistent with acute presentation with potential threat to life or bodily function.   The patient is on the cardiac monitor to evaluate for evidence of arrhythmia and/or significant heart rate changes.   Patient with history of coronary artery disease who presents to the emergency department today because of concerns for chest tightness that woke him from sleep.  He received aspirin by EMS.  My initial exam patient did have some continued pain.  He was given another nitroglycerin and did feel significant improvement thereafter.  EKG without ST elevation.  Did repeat the EKG sometime later which continued to not show any ST elevation.  Initial troponin negative.  Chest x-ray without pneumonia.  Awaiting second troponin at time of sign out. Did advise patient to let us know if his pain return.     FINAL CLINICAL IMPRESSION(S) / ED  DIAGNOSES   Chest pain   Note:  This document was prepared using Dragon voice recognition software and may include unintentional dictation errors.    Phineas Semen, MD 05/22/23 [phone]

## 2023-05-25 ENCOUNTER — Encounter: Payer: Self-pay | Admitting: Cardiology

## 2023-05-25 ENCOUNTER — Ambulatory Visit: Payer: Medicare HMO | Attending: Cardiology | Admitting: Cardiology

## 2023-05-25 VITALS — BP 138/68 | HR 63 | Ht 68.0 in | Wt 214.0 lb

## 2023-05-25 DIAGNOSIS — I480 Paroxysmal atrial fibrillation: Secondary | ICD-10-CM

## 2023-05-25 DIAGNOSIS — I2510 Atherosclerotic heart disease of native coronary artery without angina pectoris: Secondary | ICD-10-CM

## 2023-05-25 DIAGNOSIS — R079 Chest pain, unspecified: Secondary | ICD-10-CM | POA: Diagnosis not present

## 2023-05-25 DIAGNOSIS — I453 Trifascicular block: Secondary | ICD-10-CM

## 2023-05-25 DIAGNOSIS — Z794 Long term (current) use of insulin: Secondary | ICD-10-CM

## 2023-05-25 DIAGNOSIS — E1122 Type 2 diabetes mellitus with diabetic chronic kidney disease: Secondary | ICD-10-CM

## 2023-05-25 DIAGNOSIS — I5022 Chronic systolic (congestive) heart failure: Secondary | ICD-10-CM

## 2023-05-25 DIAGNOSIS — N183 Chronic kidney disease, stage 3 unspecified: Secondary | ICD-10-CM

## 2023-05-25 DIAGNOSIS — I10 Essential (primary) hypertension: Secondary | ICD-10-CM

## 2023-05-25 DIAGNOSIS — N1832 Chronic kidney disease, stage 3b: Secondary | ICD-10-CM

## 2023-05-25 DIAGNOSIS — I255 Ischemic cardiomyopathy: Secondary | ICD-10-CM

## 2023-05-25 MED ORDER — ROSUVASTATIN CALCIUM 40 MG PO TABS: 40.0000 mg | ORAL_TABLET | Freq: Every day | ORAL | 3 refills | Status: DC
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
## 2023-05-25 NOTE — Patient Instructions (Signed)
Medication Instructions:  Your physician recommends that you continue on your current medications as directed. Please refer to the Current Medication list given to you today.  *If you need a refill on your cardiac medications before your next appointment, please call your pharmacy*   Lab Work: none If you have labs (blood work) drawn today and your tests are completely normal, you will receive your results only by: MyChart Message (if you have MyChart) OR A paper copy in the mail If you have any lab test that is abnormal or we need to change your treatment, we will call you to review the results.   Testing/Procedures: Your provider has ordered a Lexiscan/ Exercise Myoview Stress test. This will take place at [facility]. Please report to the [facility] medical mall entrance. The volunteers at the first desk will direct you where to go.  [facility] MYOVIEW  Your provider has ordered a Stress Test with nuclear imaging. The purpose of this test is to evaluate the blood supply to your heart muscle. This procedure is referred to as a "Non-Invasive Stress Test." This is because other than having an IV started in your vein, nothing is inserted or "invades" your body. Cardiac stress tests are done to find areas of poor blood flow to the heart by determining the extent of coronary artery disease (CAD). Some patients exercise on a treadmill, which naturally increases the blood flow to your heart, while others who are unable to walk on a treadmill due to physical limitations will have a pharmacologic/chemical stress agent called Lexiscan . This medicine will mimic walking on a treadmill by temporarily increasing your coronary blood flow.   Please note: these test may take anywhere between 2-4 hours to complete  How to prepare for your Myoview test:  Nothing to eat for 6 hours prior to the test No caffeine for 24 hours prior to test No smoking 24 hours prior to test. Your medication may be taken with water.  If your  doctor stopped a medication because of this test, do not take that medication. Ladies, please do not wear dresses.  Skirts or pants are appropriate. Please wear a short sleeve shirt. No perfume, cologne or lotion. Wear comfortable walking shoes. No heels!   PLEASE NOTIFY THE OFFICE AT LEAST 24 HOURS IN ADVANCE IF YOU ARE UNABLE TO KEEP YOUR APPOINTMENT.  [phone] AND  PLEASE NOTIFY NUCLEAR MEDICINE AT [facility] AT LEAST 24 HOURS IN ADVANCE IF YOU ARE UNABLE TO KEEP YOUR APPOINTMENT. [phone]    Follow-Up: At [facility], you and your health needs are our priority.  As part of our continuing mission to provide you with exceptional heart care, we have created designated Provider Care Teams.  These Care Teams include your primary Cardiologist (physician) and Advanced Practice Providers (APPs -  Physician Assistants and Nurse Practitioners) who all work together to provide you with the care you need, when you need it.  We recommend signing up for the patient portal called "MyChart".  Sign up information is provided on this After Visit Summary.  MyChart is used to connect with patients for Virtual Visits (Telemedicine).  Patients are able to view lab/test results, encounter notes, upcoming appointments, etc.  Non-urgent messages can be sent to your provider as well.   To learn more about what you can do with MyChart, go to [email or web address].    Your next appointment:   2 week(s)  Provider:   You may see Lorine Bears, MD or one of the following Advanced  Practice Providers on your designated Care Team:   Nicolasa Ducking, NP Eula Listen, PA-C Cadence Fransico Michael, PA-C Charlsie Quest, NP

## 2023-05-26 ENCOUNTER — Other Ambulatory Visit
Admission: RE | Admit: 2023-05-26 | Discharge: 2023-05-26 | Disposition: A | Discharge status: Home / Self Care | Payer: Medicare HMO | Source: Ambulatory Visit | Attending: Cardiology | Admitting: Cardiology

## 2023-05-26 DIAGNOSIS — Z794 Long term (current) use of insulin: Secondary | ICD-10-CM | POA: Insufficient documentation

## 2023-05-26 DIAGNOSIS — E1122 Type 2 diabetes mellitus with diabetic chronic kidney disease: Secondary | ICD-10-CM | POA: Insufficient documentation

## 2023-05-26 DIAGNOSIS — Z8546 Personal history of malignant neoplasm of prostate: Secondary | ICD-10-CM | POA: Diagnosis not present

## 2023-05-26 DIAGNOSIS — I130 Hypertensive heart and chronic kidney disease with heart failure and stage 1 through stage 4 chronic kidney disease, or unspecified chronic kidney disease: Secondary | ICD-10-CM | POA: Insufficient documentation

## 2023-05-26 DIAGNOSIS — E611 Iron deficiency: Secondary | ICD-10-CM | POA: Diagnosis not present

## 2023-05-26 DIAGNOSIS — Z7984 Long term (current) use of oral hypoglycemic drugs: Secondary | ICD-10-CM | POA: Insufficient documentation

## 2023-05-26 DIAGNOSIS — Z79899 Other long term (current) drug therapy: Secondary | ICD-10-CM | POA: Diagnosis not present

## 2023-05-26 DIAGNOSIS — N189 Chronic kidney disease, unspecified: Secondary | ICD-10-CM | POA: Insufficient documentation

## 2023-05-26 DIAGNOSIS — I5020 Unspecified systolic (congestive) heart failure: Secondary | ICD-10-CM | POA: Diagnosis not present

## 2023-05-27 LAB — MISC LABCORP TEST (SEND OUT): Labcorp test code: 143000

## 2023-05-30 ENCOUNTER — Encounter
Admission: RE | Admit: 2023-05-30 | Discharge: 2023-05-30 | Disposition: A | Discharge status: Home / Self Care | Payer: Medicare HMO | Source: Ambulatory Visit | Attending: Cardiology | Admitting: Cardiology

## 2023-05-30 DIAGNOSIS — R079 Chest pain, unspecified: Secondary | ICD-10-CM | POA: Insufficient documentation

## 2023-05-30 LAB — NM MYOCAR MULTI W/SPECT W/WALL MOTION / EF
LV dias vol: 215 mL (ref 62–150)
LV sys vol: 145 mL
Nuc Stress EF: 33 %
Peak HR: 64 {beats}/min
Rest HR: 60 {beats}/min
Rest Nuclear Isotope Dose: 9.7 mCi
SDS: 0
SRS: 15
SSS: 4
ST Depression (mm): 0 mm
Stress Nuclear Isotope Dose: 32.6 mCi
TID: 0.91

## 2023-05-30 MED ORDER — TECHNETIUM TC 99M TETROFOSMIN IV KIT
32.6200 | PACK | Freq: Once | INTRAVENOUS | Status: AC | PRN
  Administered 2023-05-30: 32.62 via INTRAVENOUS

## 2023-05-30 MED ORDER — TECHNETIUM TC 99M TETROFOSMIN IV KIT
9.7300 | PACK | Freq: Once | INTRAVENOUS | Status: AC | PRN
  Administered 2023-05-30: 9.73 via INTRAVENOUS

## 2023-05-30 MED ORDER — REGADENOSON 0.4 MG/5ML IV SOLN
0.4000 mg | Freq: Once | INTRAVENOUS | Status: AC
  Administered 2023-05-30: 0.4 mg via INTRAVENOUS

## 2023-05-31 ENCOUNTER — Encounter: Payer: Medicare HMO | Admitting: Family

## 2023-06-02 NOTE — Progress Notes (Signed)
Results are abnormal. Recommend moving follow up appointment up to discuss symptoms and next steps in treatment.

## 2023-06-03 ENCOUNTER — Other Ambulatory Visit: Payer: Self-pay

## 2023-06-03 ENCOUNTER — Emergency Department: Payer: Medicare HMO

## 2023-06-03 ENCOUNTER — Inpatient Hospital Stay
Admission: EM | Admit: 2023-06-03 | Discharge: 2023-06-06 | DRG: 287 | Disposition: A | Discharge status: Home / Self Care | Payer: Medicare HMO | Attending: Internal Medicine | Admitting: Internal Medicine

## 2023-06-03 DIAGNOSIS — N183 Chronic kidney disease, stage 3 unspecified: Secondary | ICD-10-CM

## 2023-06-03 DIAGNOSIS — I10 Essential (primary) hypertension: Secondary | ICD-10-CM | POA: Diagnosis not present

## 2023-06-03 DIAGNOSIS — I130 Hypertensive heart and chronic kidney disease with heart failure and stage 1 through stage 4 chronic kidney disease, or unspecified chronic kidney disease: Secondary | ICD-10-CM | POA: Diagnosis present

## 2023-06-03 DIAGNOSIS — I214 Non-ST elevation (NSTEMI) myocardial infarction: Principal | ICD-10-CM

## 2023-06-03 DIAGNOSIS — I25110 Atherosclerotic heart disease of native coronary artery with unstable angina pectoris: Secondary | ICD-10-CM | POA: Diagnosis not present

## 2023-06-03 DIAGNOSIS — Z87891 Personal history of nicotine dependence: Secondary | ICD-10-CM

## 2023-06-03 DIAGNOSIS — Z888 Allergy status to other drugs, medicaments and biological substances status: Secondary | ICD-10-CM

## 2023-06-03 DIAGNOSIS — I25710 Atherosclerosis of autologous vein coronary artery bypass graft(s) with unstable angina pectoris: Secondary | ICD-10-CM | POA: Diagnosis present

## 2023-06-03 DIAGNOSIS — I2510 Atherosclerotic heart disease of native coronary artery without angina pectoris: Secondary | ICD-10-CM | POA: Diagnosis present

## 2023-06-03 DIAGNOSIS — I249 Acute ischemic heart disease, unspecified: Secondary | ICD-10-CM | POA: Insufficient documentation

## 2023-06-03 DIAGNOSIS — R079 Chest pain, unspecified: Secondary | ICD-10-CM | POA: Diagnosis not present

## 2023-06-03 DIAGNOSIS — Z794 Long term (current) use of insulin: Secondary | ICD-10-CM

## 2023-06-03 DIAGNOSIS — I480 Paroxysmal atrial fibrillation: Secondary | ICD-10-CM | POA: Diagnosis present

## 2023-06-03 DIAGNOSIS — Z7951 Long term (current) use of inhaled steroids: Secondary | ICD-10-CM

## 2023-06-03 DIAGNOSIS — I255 Ischemic cardiomyopathy: Secondary | ICD-10-CM | POA: Diagnosis present

## 2023-06-03 DIAGNOSIS — K219 Gastro-esophageal reflux disease without esophagitis: Secondary | ICD-10-CM | POA: Diagnosis present

## 2023-06-03 DIAGNOSIS — Z79899 Other long term (current) drug therapy: Secondary | ICD-10-CM

## 2023-06-03 DIAGNOSIS — I453 Trifascicular block: Secondary | ICD-10-CM | POA: Diagnosis present

## 2023-06-03 DIAGNOSIS — Z6833 Body mass index (BMI) 33.0-33.9, adult: Secondary | ICD-10-CM

## 2023-06-03 DIAGNOSIS — N184 Chronic kidney disease, stage 4 (severe): Secondary | ICD-10-CM | POA: Diagnosis present

## 2023-06-03 DIAGNOSIS — Z8674 Personal history of sudden cardiac arrest: Secondary | ICD-10-CM

## 2023-06-03 DIAGNOSIS — E1129 Type 2 diabetes mellitus with other diabetic kidney complication: Secondary | ICD-10-CM | POA: Diagnosis present

## 2023-06-03 DIAGNOSIS — N1832 Chronic kidney disease, stage 3b: Secondary | ICD-10-CM | POA: Diagnosis present

## 2023-06-03 DIAGNOSIS — I447 Left bundle-branch block, unspecified: Secondary | ICD-10-CM | POA: Diagnosis present

## 2023-06-03 DIAGNOSIS — E1122 Type 2 diabetes mellitus with diabetic chronic kidney disease: Secondary | ICD-10-CM | POA: Diagnosis present

## 2023-06-03 DIAGNOSIS — E785 Hyperlipidemia, unspecified: Secondary | ICD-10-CM | POA: Diagnosis present

## 2023-06-03 DIAGNOSIS — E1169 Type 2 diabetes mellitus with other specified complication: Secondary | ICD-10-CM

## 2023-06-03 DIAGNOSIS — E669 Obesity, unspecified: Secondary | ICD-10-CM | POA: Diagnosis present

## 2023-06-03 DIAGNOSIS — I472 Ventricular tachycardia, unspecified: Secondary | ICD-10-CM | POA: Diagnosis present

## 2023-06-03 DIAGNOSIS — I5022 Chronic systolic (congestive) heart failure: Secondary | ICD-10-CM | POA: Diagnosis not present

## 2023-06-03 DIAGNOSIS — Z950 Presence of cardiac pacemaker: Secondary | ICD-10-CM

## 2023-06-03 DIAGNOSIS — M549 Dorsalgia, unspecified: Secondary | ICD-10-CM | POA: Diagnosis present

## 2023-06-03 DIAGNOSIS — Z1152 Encounter for screening for COVID-19: Secondary | ICD-10-CM

## 2023-06-03 DIAGNOSIS — Z955 Presence of coronary angioplasty implant and graft: Secondary | ICD-10-CM

## 2023-06-03 DIAGNOSIS — Z7901 Long term (current) use of anticoagulants: Secondary | ICD-10-CM

## 2023-06-03 DIAGNOSIS — R011 Cardiac murmur, unspecified: Secondary | ICD-10-CM | POA: Diagnosis present

## 2023-06-03 DIAGNOSIS — Z8249 Family history of ischemic heart disease and other diseases of the circulatory system: Secondary | ICD-10-CM

## 2023-06-03 DIAGNOSIS — I442 Atrioventricular block, complete: Secondary | ICD-10-CM | POA: Diagnosis present

## 2023-06-03 DIAGNOSIS — I252 Old myocardial infarction: Secondary | ICD-10-CM

## 2023-06-03 DIAGNOSIS — I5023 Acute on chronic systolic (congestive) heart failure: Secondary | ICD-10-CM

## 2023-06-03 DIAGNOSIS — Z7984 Long term (current) use of oral hypoglycemic drugs: Secondary | ICD-10-CM

## 2023-06-03 HISTORY — DX: Acute ischemic heart disease, unspecified: I24.9

## 2023-06-03 LAB — MAGNESIUM: Magnesium: 2.1 mg/dL (ref 1.7–2.4)

## 2023-06-03 LAB — COMPREHENSIVE METABOLIC PANEL
ALT: 10 U/L (ref 0–44)
AST: 13 U/L — ABNORMAL LOW (ref 15–41)
Albumin: 3.3 g/dL — ABNORMAL LOW (ref 3.5–5.0)
Alkaline Phosphatase: 64 U/L (ref 38–126)
Anion gap: 16 — ABNORMAL HIGH (ref 5–15)
BUN: 25 mg/dL — ABNORMAL HIGH (ref 8–23)
CO2: 24 mmol/L (ref 22–32)
Calcium: 9 mg/dL (ref 8.9–10.3)
Chloride: 105 mmol/L (ref 98–111)
Creatinine, Ser: 1.66 mg/dL — ABNORMAL HIGH (ref 0.61–1.24)
GFR, Estimated: 40 mL/min — ABNORMAL LOW (ref >60)
Glucose, Bld: 137 mg/dL — ABNORMAL HIGH (ref 70–99)
Potassium: 3.9 mmol/L (ref 3.5–5.1)
Sodium: 145 mmol/L (ref 135–145)
Total Bilirubin: 0.5 mg/dL (ref 0.3–1.2)
Total Protein: 6.9 g/dL (ref 6.5–8.1)

## 2023-06-03 LAB — LIPID PANEL
Cholesterol: 169 mg/dL (ref 0–200)
HDL: 47 mg/dL (ref >40)
LDL Cholesterol: 109 mg/dL — ABNORMAL HIGH (ref 0–99)
Total CHOL/HDL Ratio: 3.6 RATIO
Triglycerides: 66 mg/dL (ref <150)
VLDL: 13 mg/dL (ref 0–40)

## 2023-06-03 LAB — CBC WITH DIFFERENTIAL/PLATELET
Abs Immature Granulocytes: 0.01 10*3/uL (ref 0.00–0.07)
Basophils Absolute: 0 10*3/uL (ref 0.0–0.1)
Basophils Relative: 1 %
Eosinophils Absolute: 0.2 10*3/uL (ref 0.0–0.5)
Eosinophils Relative: 4 %
HCT: 43.6 % (ref 39.0–52.0)
Hemoglobin: 13.5 g/dL (ref 13.0–17.0)
Immature Granulocytes: 0 %
Lymphocytes Relative: 19 %
Lymphs Abs: 1 10*3/uL (ref 0.7–4.0)
MCH: 27.4 pg (ref 26.0–34.0)
MCHC: 31 g/dL (ref 30.0–36.0)
MCV: 88.4 fL (ref 80.0–100.0)
Monocytes Absolute: 0.5 10*3/uL (ref 0.1–1.0)
Monocytes Relative: 9 %
Neutro Abs: 3.6 10*3/uL (ref 1.7–7.7)
Neutrophils Relative %: 67 %
Platelets: 171 10*3/uL (ref 150–400)
RBC: 4.93 MIL/uL (ref 4.22–5.81)
RDW: 15.1 % (ref 11.5–15.5)
WBC: 5.4 10*3/uL (ref 4.0–10.5)
nRBC: 0 % (ref 0.0–0.2)

## 2023-06-03 LAB — HEPARIN LEVEL (UNFRACTIONATED): Heparin Unfractionated: 0.22 IU/mL — ABNORMAL LOW (ref 0.30–0.70)

## 2023-06-03 LAB — GLUCOSE, CAPILLARY
Glucose-Capillary: 117 mg/dL — ABNORMAL HIGH (ref 70–99)
Glucose-Capillary: 181 mg/dL — ABNORMAL HIGH (ref 70–99)
Glucose-Capillary: 136 mg/dL — ABNORMAL HIGH (ref 70–99)

## 2023-06-03 LAB — PROTIME-INR
INR: 1.1 (ref 0.8–1.2)
Prothrombin Time: 14.2 seconds (ref 11.4–15.2)

## 2023-06-03 LAB — APTT
aPTT: 28 seconds (ref 24–36)
aPTT: 61 seconds — ABNORMAL HIGH (ref 24–36)
aPTT: 96 seconds — ABNORMAL HIGH (ref 24–36)

## 2023-06-03 LAB — TROPONIN I (HIGH SENSITIVITY)
Troponin I (High Sensitivity): 16 ng/L (ref <18)
Troponin I (High Sensitivity): 16 ng/L (ref <18)
Troponin I (High Sensitivity): 15 ng/L (ref <18)
Troponin I (High Sensitivity): 16 ng/L (ref <18)
Troponin I (High Sensitivity): 13 ng/L (ref <18)

## 2023-06-03 LAB — SARS CORONAVIRUS 2 BY RT PCR: SARS Coronavirus 2 by RT PCR: NEGATIVE

## 2023-06-03 LAB — BRAIN NATRIURETIC PEPTIDE: B Natriuretic Peptide: 1620.5 pg/mL — ABNORMAL HIGH (ref 0.0–100.0)

## 2023-06-03 MED ORDER — ISOSORBIDE MONONITRATE ER 30 MG PO TB24
30.0000 mg | ORAL_TABLET | Freq: Once | ORAL | Status: AC
  Administered 2023-06-03: 30 mg via ORAL
  Filled 2023-06-03: qty 1

## 2023-06-03 MED ORDER — ASPIRIN 81 MG PO CHEW
324.0000 mg | CHEWABLE_TABLET | Freq: Once | ORAL | Status: AC
  Administered 2023-06-03: 324 mg via ORAL
  Filled 2023-06-03: qty 4

## 2023-06-03 MED ORDER — ALBUTEROL SULFATE (2.5 MG/3ML) 0.083% IN NEBU: 3.0000 mL | INHALATION_SOLUTION | RESPIRATORY_TRACT | Status: DC | PRN

## 2023-06-03 MED ORDER — INSULIN GLARGINE-YFGN 100 UNIT/ML ~~LOC~~ SOLN
10.0000 [IU] | Freq: Every day | SUBCUTANEOUS | Status: DC
  Administered 2023-06-04 – 2023-06-05 (×2): 10 [IU] via SUBCUTANEOUS
  Filled 2023-06-03 (×4): qty 0.1

## 2023-06-03 MED ORDER — POLYETHYLENE GLYCOL 3350 17 G PO PACK: 17.0000 g | PACK | Freq: Every day | ORAL | Status: DC | PRN

## 2023-06-03 MED ORDER — SACUBITRIL-VALSARTAN 97-103 MG PO TABS
1.0000 | ORAL_TABLET | Freq: Two times a day (BID) | ORAL | Status: DC
  Administered 2023-06-03 – 2023-06-05 (×6): 1 via ORAL
  Filled 2023-06-03 (×6): qty 1

## 2023-06-03 MED ORDER — MORPHINE SULFATE (PF) 4 MG/ML IV SOLN
4.0000 mg | Freq: Once | INTRAVENOUS | Status: AC
  Administered 2023-06-03: 4 mg via INTRAVENOUS
  Filled 2023-06-03: qty 1

## 2023-06-03 MED ORDER — HEPARIN (PORCINE) 25000 UT/250ML-% IV SOLN
1200.0000 [IU]/h | INTRAVENOUS | Status: DC
  Administered 2023-06-03: 1200 [IU]/h via INTRAVENOUS
  Administered 2023-06-03: 1400 [IU]/h via INTRAVENOUS
  Administered 2023-06-04: 1300 [IU]/h via INTRAVENOUS
  Administered 2023-06-05: 1200 [IU]/h via INTRAVENOUS
  Filled 2023-06-03 (×5): qty 250

## 2023-06-03 MED ORDER — CARVEDILOL 6.25 MG PO TABS
6.2500 mg | ORAL_TABLET | Freq: Two times a day (BID) | ORAL | Status: DC
  Administered 2023-06-03 – 2023-06-05 (×5): 6.25 mg via ORAL
  Filled 2023-06-03 (×5): qty 1

## 2023-06-03 MED ORDER — HYDRALAZINE HCL 25 MG PO TABS
25.0000 mg | ORAL_TABLET | Freq: Two times a day (BID) | ORAL | Status: DC
  Administered 2023-06-03 – 2023-06-05 (×4): 25 mg via ORAL
  Filled 2023-06-03 (×5): qty 1

## 2023-06-03 MED ORDER — NITROGLYCERIN 0.4 MG SL SUBL: 0.4000 mg | SUBLINGUAL_TABLET | SUBLINGUAL | Status: DC | PRN

## 2023-06-03 MED ORDER — DIPHENHYDRAMINE HCL 50 MG/ML IJ SOLN: 12.5000 mg | Freq: Three times a day (TID) | INTRAMUSCULAR | Status: DC | PRN

## 2023-06-03 MED ORDER — MAGNESIUM OXIDE 400 MG PO TABS
400.0000 mg | ORAL_TABLET | Freq: Every day | ORAL | Status: DC
  Administered 2023-06-03 – 2023-06-05 (×3): 400 mg via ORAL
  Filled 2023-06-03 (×7): qty 1

## 2023-06-03 MED ORDER — ACETAMINOPHEN 325 MG PO TABS
650.0000 mg | ORAL_TABLET | Freq: Four times a day (QID) | ORAL | Status: DC | PRN
  Administered 2023-06-05 – 2023-06-06 (×2): 650 mg via ORAL
  Filled 2023-06-03 (×3): qty 2

## 2023-06-03 MED ORDER — OXYCODONE-ACETAMINOPHEN 5-325 MG PO TABS
1.0000 | ORAL_TABLET | ORAL | Status: DC | PRN
  Administered 2023-06-05: 1 via ORAL
  Filled 2023-06-03: qty 1

## 2023-06-03 MED ORDER — HEPARIN BOLUS VIA INFUSION
1300.0000 [IU] | Freq: Once | INTRAVENOUS | Status: AC
  Administered 2023-06-03: 1300 [IU] via INTRAVENOUS
  Filled 2023-06-03: qty 1300

## 2023-06-03 MED ORDER — ISOSORBIDE MONONITRATE ER 30 MG PO TB24
30.0000 mg | ORAL_TABLET | Freq: Every day | ORAL | Status: DC
  Administered 2023-06-03: 30 mg via ORAL
  Filled 2023-06-03: qty 1

## 2023-06-03 MED ORDER — VERICIGUAT 10 MG PO TABS
10.0000 mg | ORAL_TABLET | Freq: Every day | ORAL | Status: DC
  Administered 2023-06-03 – 2023-06-05 (×3): 10 mg via ORAL
  Filled 2023-06-03 (×2): qty 1

## 2023-06-03 MED ORDER — ACYCLOVIR 200 MG PO CAPS
800.0000 mg | ORAL_CAPSULE | Freq: Every day | ORAL | Status: DC
  Administered 2023-06-03 – 2023-06-05 (×3): 800 mg via ORAL
  Filled 2023-06-03 (×4): qty 4

## 2023-06-03 MED ORDER — SPIRONOLACTONE 12.5 MG HALF TABLET
12.5000 mg | ORAL_TABLET | Freq: Every day | ORAL | Status: DC
  Administered 2023-06-03 – 2023-06-05 (×3): 12.5 mg via ORAL
  Filled 2023-06-03 (×4): qty 1

## 2023-06-03 MED ORDER — DM-GUAIFENESIN ER 30-600 MG PO TB12: 1.0000 | ORAL_TABLET | Freq: Two times a day (BID) | ORAL | Status: DC | PRN

## 2023-06-03 MED ORDER — HYDRALAZINE HCL 20 MG/ML IJ SOLN: 5.0000 mg | INTRAMUSCULAR | Status: DC | PRN

## 2023-06-03 MED ORDER — ROSUVASTATIN CALCIUM 10 MG PO TABS
40.0000 mg | ORAL_TABLET | Freq: Every day | ORAL | Status: DC
  Administered 2023-06-03 – 2023-06-05 (×3): 40 mg via ORAL
  Filled 2023-06-03 (×3): qty 4

## 2023-06-03 MED ORDER — PANTOPRAZOLE SODIUM 40 MG PO TBEC
40.0000 mg | DELAYED_RELEASE_TABLET | Freq: Two times a day (BID) | ORAL | Status: DC
  Administered 2023-06-03 – 2023-06-05 (×6): 40 mg via ORAL
  Filled 2023-06-03 (×6): qty 1

## 2023-06-03 MED ORDER — INSULIN ASPART 100 UNIT/ML IJ SOLN: 0.0000 [IU] | Freq: Every day | INTRAMUSCULAR | Status: DC

## 2023-06-03 MED ORDER — ASPIRIN 81 MG PO TBEC
81.0000 mg | DELAYED_RELEASE_TABLET | Freq: Every day | ORAL | Status: DC
  Administered 2023-06-04 – 2023-06-05 (×2): 81 mg via ORAL
  Filled 2023-06-03 (×2): qty 1

## 2023-06-03 MED ORDER — HEPARIN BOLUS VIA INFUSION
4000.0000 [IU] | Freq: Once | INTRAVENOUS | Status: AC
  Administered 2023-06-03: 4000 [IU] via INTRAVENOUS
  Filled 2023-06-03: qty 4000

## 2023-06-03 MED ORDER — TORSEMIDE 20 MG PO TABS
40.0000 mg | ORAL_TABLET | Freq: Every day | ORAL | Status: DC
  Administered 2023-06-03: 40 mg via ORAL
  Filled 2023-06-03: qty 2

## 2023-06-03 MED ORDER — NITROGLYCERIN 2 % TD OINT
1.0000 [in_us] | TOPICAL_OINTMENT | Freq: Once | TRANSDERMAL | Status: AC
  Administered 2023-06-03: 1 [in_us] via TOPICAL
  Filled 2023-06-03: qty 1

## 2023-06-03 MED ORDER — MORPHINE SULFATE (PF) 2 MG/ML IV SOLN: 0.5000 mg | INTRAVENOUS | Status: DC | PRN

## 2023-06-03 MED ORDER — ISOSORBIDE MONONITRATE ER 60 MG PO TB24
60.0000 mg | ORAL_TABLET | Freq: Every day | ORAL | Status: DC
  Administered 2023-06-04 – 2023-06-05 (×2): 60 mg via ORAL
  Filled 2023-06-03 (×2): qty 1

## 2023-06-03 MED ORDER — INSULIN ASPART 100 UNIT/ML IJ SOLN
0.0000 [IU] | Freq: Three times a day (TID) | INTRAMUSCULAR | Status: DC
  Administered 2023-06-03: 2 [IU] via SUBCUTANEOUS
  Administered 2023-06-04: 3 [IU] via SUBCUTANEOUS
  Administered 2023-06-04: 1 [IU] via SUBCUTANEOUS
  Administered 2023-06-05: 2 [IU] via SUBCUTANEOUS
  Filled 2023-06-03 (×5): qty 1

## 2023-06-03 NOTE — Plan of Care (Signed)
  Problem: Education: Goal: Ability to demonstrate management of disease process will improve Outcome: Progressing Goal: Ability to verbalize understanding of medication therapies will improve Outcome: Progressing Goal: Individualized Educational Video(s) Outcome: Progressing   Problem: Activity: Goal: Capacity to carry out activities will improve Outcome: Progressing   Problem: Cardiac: Goal: Ability to achieve and maintain adequate cardiopulmonary perfusion will improve Outcome: Progressing   Problem: Education: Goal: Ability to describe self-care measures that may prevent or decrease complications (Diabetes Survival Skills Education) will improve Outcome: Progressing Goal: Individualized Educational Video(s) Outcome: Progressing   Problem: Coping: Goal: Ability to adjust to condition or change in health will improve Outcome: Progressing   Problem: Fluid Volume: Goal: Ability to maintain a balanced intake and output will improve Outcome: Progressing   Problem: Health Behavior/Discharge Planning: Goal: Ability to identify and utilize available resources and services will improve Outcome: Progressing Goal: Ability to manage health-related needs will improve Outcome: Progressing   Problem: Metabolic: Goal: Ability to maintain appropriate glucose levels will improve Outcome: Progressing   Problem: Nutritional: Goal: Maintenance of adequate nutrition will improve Outcome: Progressing Goal: Progress toward achieving an optimal weight will improve Outcome: Progressing   Problem: Skin Integrity: Goal: Risk for impaired skin integrity will decrease Outcome: Progressing   Problem: Tissue Perfusion: Goal: Adequacy of tissue perfusion will improve Outcome: Progressing   Problem: Education: Goal: Knowledge of General Education information will improve Description: Including pain rating scale, medication(s)/side effects and non-pharmacologic comfort measures Outcome:  Progressing   Problem: Health Behavior/Discharge Planning: Goal: Ability to manage health-related needs will improve Outcome: Progressing   Problem: Clinical Measurements: Goal: Ability to maintain clinical measurements within normal limits will improve Outcome: Progressing Goal: Will remain free from infection Outcome: Progressing Goal: Diagnostic test results will improve Outcome: Progressing Goal: Respiratory complications will improve Outcome: Progressing Goal: Cardiovascular complication will be avoided Outcome: Progressing   Problem: Activity: Goal: Risk for activity intolerance will decrease Outcome: Progressing   Problem: Nutrition: Goal: Adequate nutrition will be maintained Outcome: Progressing   Problem: Coping: Goal: Level of anxiety will decrease Outcome: Progressing   Problem: Elimination: Goal: Will not experience complications related to bowel motility Outcome: Progressing Goal: Will not experience complications related to urinary retention Outcome: Progressing   Problem: Pain Managment: Goal: General experience of comfort will improve Outcome: Progressing   Problem: Safety: Goal: Ability to remain free from injury will improve Outcome: Progressing   Problem: Skin Integrity: Goal: Risk for impaired skin integrity will decrease Outcome: Progressing   Problem: Education: Goal: Understanding of CV disease, CV risk reduction, and recovery process will improve Outcome: Progressing Goal: Individualized Educational Video(s) Outcome: Progressing   Problem: Activity: Goal: Ability to return to baseline activity level will improve Outcome: Progressing   Problem: Cardiovascular: Goal: Ability to achieve and maintain adequate cardiovascular perfusion will improve Outcome: Progressing Goal: Vascular access site(s) Level 0-1 will be maintained Outcome: Progressing   Problem: Health Behavior/Discharge Planning: Goal: Ability to safely manage  health-related needs after discharge will improve Outcome: Progressing   

## 2023-06-03 NOTE — Progress Notes (Signed)
ANTICOAGULATION CONSULT NOTE  Pharmacy Consult for heparin infusion Indication: ACS/STEMI  Allergies  Allergen Reactions   Angiotensin Receptor Blockers     hyperkalemia   Spironolactone     Hyperkalemia    Metformin Diarrhea    Patient Measurements: Weight: 99.7 kg (219 lb 12.8 oz) Heparin Dosing Weight: 89.8 kg  Vital Signs: Temp: 97.4 F (36.3 C) (07/26 1303) Temp Source: Oral (07/26 0846) BP: 147/90 (07/26 1303) Pulse Rate: 65 (07/26 1303)  Labs: Recent Labs    06/03/23 0347 06/03/23 0544 06/03/23 1025 06/03/23 1155 06/03/23 1356  HGB 13.5  --   --   --   --   HCT 43.6  --   --   --   --   PLT 171  --   --   --   --   APTT  --  28  --   --  61*  LABPROT  --  14.2  --   --   --   INR  --  1.1  --   --   --   HEPARINUNFRC  --  0.22*  --   --   --   CREATININE 1.66*  --   --   --   --   TROPONINIHS 16 16 15 16   --     Estimated Creatinine Clearance: 37.2 mL/min (A) (by C-G formula based on SCr of 1.66 mg/dL (H)).   Medical History: Past Medical History:  Diagnosis Date   AV block, [place] 1    CAD (coronary artery disease)    a. 08/2019 VF arrest-->sev 3vd on cath-->CABGx5 (LIMA->LAD, VG->OM->LCX, VG->RPDA, VG->Diag); b. 08/2021 Cath: sev native multivessel dzs w/ 5/5 patent grafts. Nl filling pressures->Med rx.   CKD (chronic kidney disease), stage III (HCC)    Diabetes mellitus without complication (HCC)    HFrEF (heart failure with reduced ejection fraction) (HCC)    a. 08/2020 Echo: EF 40-45%; b. 11/2020 Echo: EF 55%, Gr2 DD; c. 08/2021 Echo: EF 35-40%, glob HK w/ ant/antsept/apical HK; d. 10/2021 s/p CRT-P; e. 12/2021 Echo: EF 30-35%, glob HK, GrIII DD; f. 12/2022 Echo: EF 30-35%, glob HK, sev HK of ant wall, mod asymm LVH, nl RV fxn, mildly dil LA, triv MR, AoV sclerosis.   Hyperlipidemia LDL goal <70    Hypertension    Intermittent Complete heart block (HCC)    a. 08/2021 noted on Zio; b. 10/2021 s/p Abbott Allure RF CRT-P (Ser # [id number]).   Ischemic  cardiomyopathy    a. 08/2020 Echo: EF 40-45%; b. 11/2020 Echo: EF 55%; c. 08/2021 Echo: EF 35-40%; d. 10/2021 s/p CRT-P; e. 12/2021 Echo: EF 30-35%, glob HK, GrIII DD.   PAF (paroxysmal atrial fibrillation) (HCC)    a.Medical device check-->Eliquis (CHA2DS2VASc = 6)   Trifascicular block     Medications:  PTA Meds: Apixaban 2.5 mg BID, last dose unknown  Assessment: Pt is a 88 yo male presenting to ED c/o stabbing chest pain & SOB found w/ BNP of 1620.  Goal of Therapy:  Heparin level 0.3-0.7 units/ml aPTT 66-102 seconds Monitor platelets by anticoagulation protocol: Yes   Results 7/26@1356  aPTT=61 Subtherapeutic @ 1200 un/hr  Plan:  Bolus 1300 units x 1 Increase heparin infusion rate to 1400 units/hr Will follow aPTT until correlation w/ HL confirmed Will check aPTT  8 hr after rate change HL & CBC daily while on heparin  Raynee Mccasland Rodriguez-Guzman PharmD, BCPS 06/03/2023 2:51 PM

## 2023-06-03 NOTE — H&P (Addendum)
History and Physical    [name] UEA:[id number] DOB: Nov 13, 1934 DOA: 06/03/2023  Referring MD/NP/PA:   PCP: Idelia Salm, MD   Patient coming from:  The patient is coming from home.     Chief Complaint: Chest pain and shortness of breath  HPI: [name] is a 88 y.o. male with medical history significant of sCHF with EF 35-40, HTN, HLD, DM, GERD, CKD stage IIIa, CAD, s/p of stent, CABG, cardiac arrest, complete heart block, s/p of pacemaker, who presents with chest pain, shortness of breath.   Patient states that his chest pain started last night at about 8 PM, associated with shortness of breath.  He has cough with foamy mucus production.  No fever or chills.  Patient has nausea and vomited at home, which has resolved.  Currently no nausea, vomiting, diarrhea or abdominal pain.  Denies symptoms of UTI.  Denies any fall or head injury.  No rectal bleeding or dark stool.  Patient was given aspirin and nitroglycerin treatment by EMS without improvement of his chest pain.  He still has persistent chest pain, which is located in substernal area, dull, 8 out of 10 in severity, nonradiating.  Patient had an abnormal Myoview on 05/30/2023 as below:  Myoview on 05/30/23:   Findings are consistent with infarction with peri-infarct ischemia. The study is high risk due to degree of cardiomyopathy   No ST deviation was noted.   LV perfusion is abnormal. There is evidence of ischemia. There is evidence of infarction. Defect 1: There is a medium defect with moderate reduction in uptake present in the apical to mid anterior and anteroseptal location(s) that is partially reversible. There is abnormal wall motion in the defect area. Consistent with infarction and peri-infarct ischemia.   Left ventricular function is abnormal. Nuclear stress EF: 33%. End diastolic cavity size is moderately enlarged. End systolic cavity size is moderately enlarged.   Suboptimal study due to GI uptake.    Data  reviewed independently and ED Course: pt was found to have tgrop  16 --> 15, BNP 1620, WBC 5.4, INR 1.1, PTT 28, negative COVID PCR, renal function close to baseline, temperature normal, blood pressure 171/72, heart rate 59, RR 21, oxygen saturation 98% on room air.  Chest x-ray showed mild cardiomegaly without infiltration.  Patient is placed in PCU for observation, Dr. Okey Dupre of cardiology is consulted.   EKG: I have personally reviewed.  Sinus rhythm, QTc 503, left bundle blockade, T wave inversion in lateral leads, occasional PVC  Review of Systems:   General: no fevers, chills, no body weight gain, has fatigue HEENT: no blurry vision, hearing changes or sore throat Respiratory: has dyspnea, coughing, no wheezing CV: has chest pain, no palpitations GI: had nausea, vomiting, no abdominal pain, diarrhea, constipation GU: no dysuria, burning on urination, increased urinary frequency, hematuria  Ext: has trace leg edema Neuro: no unilateral weakness, numbness, or tingling, no vision change or hearing loss Skin: no rash, no skin tear. MSK: No muscle spasm, no deformity, no limitation of range of movement in spin Heme: No easy bruising.  Travel history: No recent long distant travel.   Allergy:  Allergies  Allergen Reactions   Angiotensin Receptor Blockers     hyperkalemia   Spironolactone     Hyperkalemia    Metformin Diarrhea    Past Medical History:  Diagnosis Date   AV block, Mobitz 1    CAD (coronary artery disease)    a. 08/2019 VF arrest-->sev 3vd on cath-->CABGx5 (  LIMA->LAD, VG->OM->LCX, VG->RPDA, VG->Diag); b. 08/2021 Cath: sev native multivessel dzs w/ 5/5 patent grafts. Nl filling pressures->Med rx.   CKD (chronic kidney disease), stage III (HCC)    Diabetes mellitus without complication (HCC)    HFrEF (heart failure with reduced ejection fraction) (HCC)    a. 08/2020 Echo: EF 40-45%; b. 11/2020 Echo: EF 55%, Gr2 DD; c. 08/2021 Echo: EF 35-40%, glob HK w/  ant/antsept/apical HK; d. 10/2021 s/p CRT-P; e. 12/2021 Echo: EF 30-35%, glob HK, GrIII DD; f. 12/2022 Echo: EF 30-35%, glob HK, sev HK of ant wall, mod asymm LVH, nl RV fxn, mildly dil LA, triv MR, AoV sclerosis.   Hyperlipidemia LDL goal <70    Hypertension    Intermittent Complete heart block (HCC)    a. 08/2021 noted on Zio; b. 10/2021 s/p Abbott Allure RF CRT-P (Ser # [id number]).   Ischemic cardiomyopathy    a. 08/2020 Echo: EF 40-45%; b. 11/2020 Echo: EF 55%; c. 08/2021 Echo: EF 35-40%; d. 10/2021 s/p CRT-P; e. 12/2021 Echo: EF 30-35%, glob HK, GrIII DD.   PAF (paroxysmal atrial fibrillation) (HCC)    a.Medical device check-->Eliquis (CHA2DS2VASc = 6)   Trifascicular block     Past Surgical History:  Procedure Laterality Date   BACK SURGERY     CARDIAC CATHETERIZATION     CLIPPING OF ATRIAL APPENDAGE N/A 08/29/2020   Procedure: CLIPPING OF ATRIAL APPENDAGE USING ATRICURE 45 MM ATRICLIP FLEX-V;  Surgeon: Delight Ovens, MD;  Location: [facility] OR;  Service: Open Heart Surgery;  Laterality: N/A;   CORONARY ARTERY BYPASS GRAFT N/A 08/29/2020   Procedure: CORONARY ARTERY BYPASS GRAFTING (CABG) X 5 USING LEFT INTERNAL MAMMARY ARTERY AND ENDOSCOPICALLY HARVESTED RIGHT GREATER SAPHENOUS VEIN. LIMA TO LAD, SVG TO OM SEQ TO CIRC, SVG TO PD, SVG TO DIAG.;  Surgeon: Delight Ovens, MD;  Location: [facility] OR;  Service: Open Heart Surgery;  Laterality: N/A;   ENDOVEIN HARVEST OF GREATER SAPHENOUS VEIN Right 08/29/2020   Procedure: ENDOVEIN HARVEST OF GREATER SAPHENOUS VEIN;  Surgeon: Delight Ovens, MD;  Location: [facility] OR;  Service: Open Heart Surgery;  Laterality: Right;   HERNIA REPAIR     LEFT HEART CATH AND CORONARY ANGIOGRAPHY N/A 08/25/2020   Procedure: LEFT HEART CATH AND CORONARY ANGIOGRAPHY;  Surgeon: Iran Ouch, MD;  Location: [facility] INVASIVE CV LAB;  Service: Cardiovascular;  Laterality: N/A;   PACEMAKER IMPLANT N/A 10/12/2021   Procedure: PACEMAKER IMPLANT;  Surgeon: Lanier Prude,  MD;  Location: [facility] INVASIVE CV LAB;  Service: Cardiovascular;  Laterality: N/A;   RIGHT/LEFT HEART CATH AND CORONARY ANGIOGRAPHY N/A 08/31/2021   Procedure: RIGHT/LEFT HEART CATH AND CORONARY ANGIOGRAPHY;  Surgeon: Iran Ouch, MD;  Location: [facility] INVASIVE CV LAB;  Service: Cardiovascular;  Laterality: N/A;   TEE WITHOUT CARDIOVERSION N/A 08/29/2020   Procedure: TRANSESOPHAGEAL ECHOCARDIOGRAM (TEE);  Surgeon: Delight Ovens, MD;  Location: [facility] OR;  Service: Open Heart Surgery;  Laterality: N/A;    Social History:  reports that he quit smoking about 26 years ago. His smoking use included cigarettes. He has never used smokeless tobacco. He reports that he does not currently use alcohol. He reports that he does not use drugs.  Family History:  Family History  Problem Relation Age of Onset   Heart attack Mother      Prior to Admission medications   Medication Sig Start Date End Date Taking? Authorizing Provider  acyclovir (ZOVIRAX) 800 MG tablet Take 1 tablet by mouth daily. 01/21/23  Yes  [provider]  albuterol (VENTOLIN HFA) 108 (90 Base) MCG/ACT inhaler Inhale 2 puffs into the lungs every 6 (six) hours as needed for wheezing or shortness of breath. 09/30/21  Yes [provider]  apixaban (ELIQUIS) 2.5 MG TABS tablet Take 1 tablet (2.5 mg total) by mouth 2 (two) times daily. 02/08/23  Yes Eustace Pen, PA-C  carvedilol (COREG) 6.25 MG tablet Take 1 tablet (6.25 mg total) by mouth 2 (two) times daily with a meal. 11/17/22 11/12/23 Yes Creig Hines, NP  dapagliflozin propanediol (FARXIGA) 10 MG TABS tablet Take 10 mg by mouth daily.   Yes [provider]  diclofenac Sodium (VOLTAREN) 1 % GEL Apply 2 g topically 4 (four) times daily. 02/21/23 02/21/24 Yes [provider]  hydrALAZINE (APRESOLINE) 25 MG tablet Take 25 mg by mouth 2 times daily at 12 noon and 4 pm.   Yes [provider]  insulin degludec (TRESIBA) 100 UNIT/ML FlexTouch  Pen Inject 0.15 mLs into the skin at bedtime. 01/11/23 01/11/24 Yes [provider]  isosorbide mononitrate (IMDUR) 30 MG 24 hr tablet TAKE 1 TABLET BY MOUTH EVERY DAY 01/31/23  Yes Clarisa Kindred A, FNP  magnesium oxide (MAG-OX) 400 MG tablet Take 1 tablet (400 mg total) by mouth daily. 01/20/22  Yes Lanier Prude, MD  pantoprazole (PROTONIX) 40 MG tablet Take 40 mg by mouth 2 (two) times daily.   Yes [provider]  Potassium Chloride ER 20 MEQ TBCR Take 1 tablet (20 mEq total) by mouth daily. 05/19/23  Yes Laurey Morale, MD  rosuvastatin (CRESTOR) 40 MG tablet Take 1 tablet (40 mg total) by mouth daily. 05/25/23  Yes Hammock, Sheri, NP  sacubitril-valsartan (ENTRESTO) 97-103 MG Take 1 tablet by mouth 2 (two) times daily. 03/23/23  Yes Creig Hines, NP  spironolactone (ALDACTONE) 25 MG tablet Take 0.5 tablets (12.5 mg total) by mouth daily. 05/19/23 08/17/23 Yes Laurey Morale, MD  torsemide (DEMADEX) 20 MG tablet Take 2 tablets (40 mg total) by mouth daily. 03/12/23  Yes Enedina Finner, MD  Vericiguat (VERQUVO) 10 MG TABS Take 1 tablet (10 mg total) by mouth daily. 04/18/23  Yes Clarisa Kindred A, FNP  acetaminophen (TYLENOL) 500 MG tablet Take 1,000 mg by mouth every 6 (six) hours as needed for mild pain, fever or headache.    [provider]  fluticasone (FLOVENT HFA) 110 MCG/ACT inhaler Inhale 1 puff into the lungs daily.    [provider]  metolazone (ZAROXOLYN) 2.5 MG tablet TAKE 1 TABLET (2.5) MG 1 HOUR BEFORE LASIX WEEKLY ON TUESDAYS 02/12/23   Clarisa Kindred A, FNP  polyethylene glycol (MIRALAX / GLYCOLAX) 17 g packet Take 17 g by mouth daily as needed for moderate constipation. 12/19/22   Alford Highland, MD    Physical Exam: Vitals:   06/03/23 0700 06/03/23 0813 06/03/23 0846 06/03/23 1303  BP: 131/72 (!) 140/75 (!) 155/92 (!) 147/90  Pulse: 60 65 64 65  Resp: 11 16  15   Temp:  97.8 F (36.6 C) 97.9 F (36.6 C) (!) 97.4 F (36.3 C)   TempSrc:  Oral Oral   SpO2: 98% 99% 100% 100%  Weight:       General: Not in acute distress HEENT:       Eyes: PERRL, EOMI, no jaundice       ENT: No discharge from the ears and nose, no pharynx injection, no tonsillar enlargement.        Neck: No JVD, no bruit,  no mass felt. Heme: No neck lymph node enlargement. Cardiac: S1/S2, RRR, No murmurs, No gallops or rubs. Respiratory: No rales, wheezing, rhonchi or rubs. GI: Soft, nondistended, nontender, no rebound pain, no organomegaly, BS present. GU: No hematuria Ext: has trace leg edema bilaterally. 1+DP/PT pulse bilaterally. Musculoskeletal: No joint deformities, No joint redness or warmth, no limitation of ROM in spin. Skin: No rashes.  Neuro: Alert, oriented X3, cranial nerves II-XII grossly intact, moves all extremities normally. Psych: Patient is not psychotic, no suicidal or hemocidal ideation.  Labs on Admission: I have personally reviewed following labs and imaging studies  CBC: Recent Labs  Lab 06/03/23 0347  WBC 5.4  NEUTROABS 3.6  HGB 13.5  HCT 43.6  MCV 88.4  PLT 171   Basic Metabolic Panel: Recent Labs  Lab 06/03/23 0347 06/03/23 0417  NA 145  --   K 3.9  --   CL 105  --   CO2 24  --   GLUCOSE 137*  --   BUN 25*  --   CREATININE 1.66*  --   CALCIUM 9.0  --   MG  --  2.1   GFR: Estimated Creatinine Clearance: 37.2 mL/min (A) (by C-G formula based on SCr of 1.66 mg/dL (H)). Liver Function Tests: Recent Labs  Lab 06/03/23 0347  AST 13*  ALT 10  ALKPHOS 64  BILITOT 0.5  PROT 6.9  ALBUMIN 3.3*   No results for input(s): "LIPASE", "AMYLASE" in the last 168 hours. No results for input(s): "AMMONIA" in the last 168 hours. Coagulation Profile: Recent Labs  Lab 06/03/23 0544  INR 1.1   Cardiac Enzymes: No results for input(s): "CKTOTAL", "CKMB", "CKMBINDEX", "TROPONINI" in the last 168 hours. BNP (last 3 results) No results for input(s): "PROBNP" in the last 8760 hours. HbA1C: No results  for input(s): "HGBA1C" in the last 72 hours. CBG: Recent Labs  Lab 06/03/23 1305  GLUCAP 117*   Lipid Profile: Recent Labs    06/03/23 0544  CHOL 169  HDL 47  LDLCALC 109*  TRIG 66  CHOLHDL 3.6   Thyroid Function Tests: No results for input(s): "TSH", "T4TOTAL", "FREET4", "T3FREE", "THYROIDAB" in the last 72 hours. Anemia Panel: No results for input(s): "VITAMINB12", "FOLATE", "FERRITIN", "TIBC", "IRON", "RETICCTPCT" in the last 72 hours. Urine analysis:    Component Value Date/Time   COLORURINE YELLOW 09/01/2020 2035   APPEARANCEUR HAZY (A) 09/01/2020 2035   LABSPEC 1.010 09/01/2020 2035   PHURINE 5.0 09/01/2020 2035   GLUCOSEU NEGATIVE 09/01/2020 2035   HGBUR MODERATE (A) 09/01/2020 2035   BILIRUBINUR NEGATIVE 09/01/2020 2035   KETONESUR NEGATIVE 09/01/2020 2035   PROTEINUR NEGATIVE 09/01/2020 2035   NITRITE NEGATIVE 09/01/2020 2035   LEUKOCYTESUR TRACE (A) 09/01/2020 2035   Sepsis Labs: @LABRCNTIP (procalcitonin:4,lacticidven:4) ) Recent Results (from the past 240 hour(s))  SARS Coronavirus 2 by RT PCR (hospital order, performed in [facility] hospital lab) *cepheid single result test* Anterior Nasal Swab     Status: None   Collection Time: 06/03/23  4:17 AM   Specimen: Anterior Nasal Swab  Result Value Ref Range Status   SARS Coronavirus 2 by RT PCR NEGATIVE NEGATIVE Final    Comment: (NOTE) SARS-CoV-2 target nucleic acids are NOT DETECTED.  The SARS-CoV-2 RNA is generally detectable in upper and lower respiratory specimens during the acute phase of infection. The lowest concentration of SARS-CoV-2 viral copies this assay can detect is 250 copies / mL. A negative result does not preclude SARS-CoV-2 infection and should not be used  as the sole basis for treatment or other patient management decisions.  A negative result may occur with improper specimen collection / handling, submission of specimen other than nasopharyngeal swab, presence of viral mutation(s)  within the areas targeted by this assay, and inadequate number of viral copies (<250 copies / mL). A negative result must be combined with clinical observations, patient history, and epidemiological information.  Fact Sheet for Patients:   RoadLapTop.co.za  Fact Sheet for Healthcare Providers: http://kim-miller.com/  This test is not yet approved or  cleared by the [place] FDA and has been authorized for detection and/or diagnosis of SARS-CoV-2 by FDA under an Emergency Use Authorization (EUA).  This EUA will remain in effect (meaning this test can be used) for the duration of the COVID-19 declaration under Section 564(b)(1) of the Act, 21 U.S.C. section 360bbb-3(b)(1), unless the authorization is terminated or revoked sooner.  Performed at [facility], [address], [place], [place] [address]      Radiological Exams on Admission: DG Chest Portable 1 View  Result Date: 06/03/2023 CLINICAL DATA:  88 year old male with history of shortness of breath and stabbing chest pain. EXAM: PORTABLE CHEST 1 VIEW COMPARISON:  Chest x-ray 05/22/2023. FINDINGS: Lung volumes are normal. No consolidative airspace disease. No pleural effusions. No pneumothorax. No evidence of pulmonary edema. Heart size is mildly enlarged. Upper mediastinal contours are within normal limits. Atherosclerosis in the thoracic aorta. Status post median sternotomy for CABG and left atrial appendage ligation (a ligation clip is noted). Left-sided pacemaker device in place with lead tips projecting over the expected location of the right atrium and right ventricle. 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 IMPRESSION: 1. No radiographic evidence of acute cardiopulmonary disease. 2. Mild cardiomegaly. 3. Aortic atherosclerosis. Electronically Signed   By: Trudie Reed M.D.   On: 06/03/2023 05:12      Assessment/Plan Principal Problem:   Chest pain Active Problems:   CAD, multiple vessel   HTN (hypertension)   Chronic systolic CHF (congestive heart failure) (HCC)   HLD (hyperlipidemia)   Type II diabetes mellitus with renal manifestations (HCC)   Dyslipidemia   Paroxysmal atrial fibrillation (HCC)   CKD stage 3b, GFR 30-44 ml/min (HCC)   Obesity (BMI 30-39.9)   Assessment and Plan:   Chest pain and hx of CAD: s/p of CABG and multiple stent placement.  Patient has persistent chest pain, concerning for unstable angina.  Troponin negative. 16 --> 15. Consulted Dr. Okey Dupre of card.  - place to tele bed for observation - IV heparin is stared in ED - Trend Trop - prn Nitroglycerin, Morphine, percocet - aspirin, Crestor, imdur - Risk factor stratification: will check FLP and A1C   HTN (hypertension): -Imdur, oral hydralazine, Entresto -prn hydralazine IV  Chronic systolic CHF (congestive heart failure) (HCC): 2D echo on 2/80/24 showed EF 30-35%.  Patient has elevated BNP 1620, but only has trace leg edema, no pulm edema chest x-ray, does not seem to have acute CHF exacerbation. -Continue home torsemide, spironolactone, Entresto, Vericiguat  HLD (hyperlipidemia) -Crestor  Type II diabetes mellitus with renal manifestations (HCC): Recent A1c 7.9, poorly controlled.  Patient taking Beaulah Corin 15 units daily -SSI -Glargine insulin 10 units daily  Dyslipidemia -Crestor  Paroxysmal atrial fibrillation (HCC) -Switched Eliquis to IV heparin -Coreg  CKD stage 3b, GFR 30-44 ml/min [facility]): Renal function stable.  Recent baseline creatinine 1.7-1.8.  His creatinine is 1.66, BUN  25, GFR 40 -Follow-up with BMP  Obesity (BMI 30-39.9): Body weight 99.7 kg, BMI 33.42 -Encourage losing weight -Exercise and healthy diet      DVT ppx: on IV Heparin    Code Status: Full code per pt  Family Communication:  Yes, patient's daughter by  phone  Disposition Plan:  Anticipate discharge back to previous environment  Consults called:  Dr. Okey Dupre of cardiology is consulted.  Admission status and Level of care: Progressive:    for obs    Dispo: The patient is from: Home              Anticipated d/c is to: Home              Anticipated d/c date is: 1 day              Patient currently is not medically stable to d/c.    Severity of Illness:  The appropriate patient status for this patient is OBSERVATION. Observation status is judged to be reasonable and necessary in order to provide the required intensity of service to ensure the patient's safety. The patient's presenting symptoms, physical exam findings, and initial radiographic and laboratory data in the context of their medical condition is felt to place them at decreased risk for further clinical deterioration. Furthermore, it is anticipated that the patient will be medically stable for discharge from the hospital within 2 midnights of admission.        Date of Service 06/03/2023    Lorretta Harp [facility]   If 7PM-7AM, please contact night-coverage [email or web address] 06/03/2023, 2:09 PM

## 2023-06-03 NOTE — Progress Notes (Signed)
ANTICOAGULATION CONSULT NOTE  Pharmacy Consult for heparin infusion Indication: ACS/STEMI  Allergies  Allergen Reactions   Angiotensin Receptor Blockers     hyperkalemia   Spironolactone     Hyperkalemia    Metformin Diarrhea    Patient Measurements: Weight: 99.7 kg (219 lb 12.8 oz) Heparin Dosing Weight: 89.8 kg  Vital Signs: Temp: 98.2 F (36.8 C) (07/26 0342) Temp Source: Oral (07/26 0342) BP: 155/84 (07/26 0530) Pulse Rate: 59 (07/26 0530)  Labs: Recent Labs    06/03/23 0347  HGB 13.5  HCT 43.6  PLT 171  CREATININE 1.66*  TROPONINIHS 16    Estimated Creatinine Clearance: 37.2 mL/min (A) (by C-G formula based on SCr of 1.66 mg/dL (H)).   Medical History: Past Medical History:  Diagnosis Date   AV block, Mobitz 1    CAD (coronary artery disease)    a. 08/2019 VF arrest-->sev 3vd on cath-->CABGx5 (LIMA->LAD, VG->OM->LCX, VG->RPDA, VG->Diag); b. 08/2021 Cath: sev native multivessel dzs w/ 5/5 patent grafts. Nl filling pressures->Med rx.   CKD (chronic kidney disease), stage III (HCC)    Diabetes mellitus without complication (HCC)    HFrEF (heart failure with reduced ejection fraction) (HCC)    a. 08/2020 Echo: EF 40-45%; b. 11/2020 Echo: EF 55%, Gr2 DD; c. 08/2021 Echo: EF 35-40%, glob HK w/ ant/antsept/apical HK; d. 10/2021 s/p CRT-P; e. 12/2021 Echo: EF 30-35%, glob HK, GrIII DD; f. 12/2022 Echo: EF 30-35%, glob HK, sev HK of ant wall, mod asymm LVH, nl RV fxn, mildly dil LA, triv MR, AoV sclerosis.   Hyperlipidemia LDL goal <70    Hypertension    Intermittent Complete heart block (HCC)    a. 08/2021 noted on Zio; b. 10/2021 s/p Abbott Allure RF CRT-P (Ser # [id number]).   Ischemic cardiomyopathy    a. 08/2020 Echo: EF 40-45%; b. 11/2020 Echo: EF 55%; c. 08/2021 Echo: EF 35-40%; d. 10/2021 s/p CRT-P; e. 12/2021 Echo: EF 30-35%, glob HK, GrIII DD.   PAF (paroxysmal atrial fibrillation) (HCC)    a.Medical device check-->Eliquis (CHA2DS2VASc = 6)   Trifascicular  block     Medications:  PTA Meds: Apixaban 2.5 mg BID, last dose unknown  Assessment: Pt is a 88 yo male presenting to ED c/o stabbing chest pain & SOB found w/ BNP of 1620.  Goal of Therapy:  Heparin level 0.3-0.7 units/ml aPTT 66-102 seconds Monitor platelets by anticoagulation protocol: Yes   Plan:  Bolus 4000 units x 1 Start heparin infusion at 1200 units/hr Will follow aPTT until correlation w/ HL confirmed Will check aPTT in 8 hr after start of infusion HL & CBC daily while on heparin  Otelia Sergeant, PharmD, [facility] 06/03/2023 5:40 AM

## 2023-06-03 NOTE — ED Provider Notes (Signed)
[facility] Provider Note    Event Date/Time   First MD Initiated Contact with Patient 06/03/23 [phone]     (approximate)   History   Chest Pain   HPI  [name] is a 88 y.o. male  here with chest pain and SOB. Pt reports that starting this evening, he developed a dull, aching, pressure like sensation in his left chest along with significant SOB. He was resting at the time. Has had persistent pain since then with cough and "foamy" sputum production. Felt okay throughout the day today. No recent fevers. No known sick contacts. EMS called and pt given asa and nitroglycerin which did improve his sx.       Physical Exam   Triage Vital Signs: ED Triage Vitals  Encounter Vitals Group     BP 06/03/23 0342 (!) 156/83     Systolic BP Percentile --      Diastolic BP Percentile --      Pulse Rate 06/03/23 0342 72     Resp 06/03/23 0342 (!) 21     Temp 06/03/23 0342 98.2 F (36.8 C)     Temp Source 06/03/23 0342 Oral     SpO2 06/03/23 0336 99 %     Weight 06/03/23 0343 219 lb 12.8 oz (99.7 kg)     Height --      Head Circumference --      Peak Flow --      Pain Score 06/03/23 0342 8     Pain Loc --      Pain Education --      Exclude from Growth Chart --     Most recent vital signs: Vitals:   06/03/23 0530 06/03/23 0600  BP: (!) 155/84 (!) 153/78  Pulse: (!) 59 63  Resp:  13  Temp:    SpO2: 100% 99%     General: Awake, no distress.  CV:  Good peripheral perfusion. RRR. No murmurs. Resp:  Tachypnea with bilateral rales and increased WOB. Abd:  No distention. No tenderness. No rebound or guarding. Other:  1+ pitting edema bl LE.   ED Results / Procedures / Treatments   Labs (all labs ordered are listed, but only abnormal results are displayed) Labs Reviewed  COMPREHENSIVE METABOLIC PANEL - Abnormal; Notable for the following components:      Result Value   Glucose, Bld 137 (*)    BUN 25 (*)    Creatinine, Ser 1.66 (*)    Albumin 3.3  (*)    AST 13 (*)    GFR, Estimated 40 (*)    Anion gap 16 (*)    All other components within normal limits  BRAIN NATRIURETIC PEPTIDE - Abnormal; Notable for the following components:   B Natriuretic Peptide 1,620.5 (*)    All other components within normal limits  HEPARIN LEVEL (UNFRACTIONATED) - Abnormal; Notable for the following components:   Heparin Unfractionated 0.22 (*)    All other components within normal limits  SARS CORONAVIRUS 2 BY RT PCR  CBC WITH DIFFERENTIAL/PLATELET  MAGNESIUM  APTT  PROTIME-INR  APTT  TROPONIN I (HIGH SENSITIVITY)  TROPONIN I (HIGH SENSITIVITY)     EKG    RADIOLOGY CXR: no active disease   I also independently reviewed and agree with radiologist interpretations.   PROCEDURES:  Critical Care performed: Yes, see critical care procedure note(s)  .Critical Care  Performed by: Shaune Pollack, MD Authorized by: Shaune Pollack, MD   Critical care provider statement:  Critical care time (minutes):  30   Critical care time was exclusive of:  Separately billable procedures and treating other patients   Critical care was necessary to treat or prevent imminent or life-threatening deterioration of the following conditions:  Cardiac failure, circulatory failure and respiratory failure   Critical care was time spent personally by me on the following activities:  Development of treatment plan with patient or surrogate, discussions with consultants, evaluation of patient's response to treatment, examination of patient, ordering and review of laboratory studies, ordering and review of radiographic studies, ordering and performing treatments and interventions, pulse oximetry, re-evaluation of patient's condition and review of old charts     MEDICATIONS ORDERED IN ED: Medications  heparin ADULT infusion 100 units/mL (25000 units/260mL) (1,200 Units/hr Intravenous New Bag/Given 06/03/23 0548)  nitroGLYCERIN (NITROGLYN) 2 % ointment 1 inch (1 inch  Topical Given 06/03/23 0535)  morphine (PF) 4 MG/ML injection 4 mg (4 mg Intravenous Given 06/03/23 0536)  heparin bolus via infusion 4,000 Units (4,000 Units Intravenous Bolus from Bag 06/03/23 0548)     IMPRESSION / MDM / ASSESSMENT AND PLAN / ED COURSE  I reviewed the triage vital signs and the nursing notes.                              Differential diagnosis includes, but is not limited to, ACS, CHF, musculoskeletal pain, PE, PNA, PTX  Patient's presentation is most consistent with acute presentation with potential threat to life or bodily function.  The patient is on the cardiac monitor to evaluate for evidence of arrhythmia and/or significant heart rate changes  88 yo M with h/o CAD, CKF, HFrEF, HTN, HLD, here with SOB, chest pain. Pt just had an abnormal stress test 7/22. EKG her shows no ST elevations but history concerning for NSTEMI. Trop negative but high risk stress test and pain remains after ntiroglycerin. BNP elevated. Will diurese, start on heparin gtt and plan to admit to medicine. [facility] cardiology paged. Pt in agreement. No signs of infection. He was borderline hypoxic on RA so placed on 2L [place].   [facility] will see. Heparin running, pain improving. VSS. Admit for suspected NSTEMI vs unstable angina w CHF.   FINAL CLINICAL IMPRESSION(S) / ED DIAGNOSES   Final diagnoses:  NSTEMI (non-ST elevated myocardial infarction) (HCC)  Acute on chronic systolic congestive heart failure (HCC)     Rx / DC Orders   ED Discharge Orders     None        Note:  This document was prepared using Dragon voice recognition software and may include unintentional dictation errors.   Shaune Pollack, MD 06/03/23 [phone]
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
## 2023-06-03 NOTE — Progress Notes (Signed)
ANTICOAGULATION CONSULT NOTE  Pharmacy Consult for heparin infusion Indication: ACS/STEMI  Allergies  Allergen Reactions   Angiotensin Receptor Blockers     hyperkalemia   Spironolactone     Hyperkalemia    Metformin Diarrhea    Patient Measurements: Weight: 99.7 kg (219 lb 12.8 oz) Heparin Dosing Weight: 89.8 kg  Vital Signs: Temp: 98.4 F (36.9 C) (07/26 2331) Temp Source: Oral (07/26 2331) BP: 104/61 (07/26 2331) Pulse Rate: 64 (07/26 2331)  Labs: Recent Labs    06/03/23 0347 06/03/23 0544 06/03/23 1025 06/03/23 1155 06/03/23 1356 06/03/23 1516 06/03/23 2257  HGB 13.5  --   --   --   --   --   --   HCT 43.6  --   --   --   --   --   --   PLT 171  --   --   --   --   --   --   APTT  --  28  --   --  61*  --  96*  LABPROT  --  14.2  --   --   --   --   --   INR  --  1.1  --   --   --   --   --   HEPARINUNFRC  --  0.22*  --   --   --   --   --   CREATININE 1.66*  --   --   --   --   --   --   TROPONINIHS 16 16 15 16   --  13  --     Estimated Creatinine Clearance: 37.2 mL/min (A) (by C-G formula based on SCr of 1.66 mg/dL (H)).   Medical History: Past Medical History:  Diagnosis Date   AV block, Mobitz 1    CAD (coronary artery disease)    a. 08/2019 VF arrest-->sev 3vd on cath-->CABGx5 (LIMA->LAD, VG->OM->LCX, VG->RPDA, VG->Diag); b. 08/2021 Cath: sev native multivessel dzs w/ 5/5 patent grafts. Nl filling pressures->Med rx.   CKD (chronic kidney disease), stage III (HCC)    Diabetes mellitus without complication (HCC)    HFrEF (heart failure with reduced ejection fraction) (HCC)    a. 08/2020 Echo: EF 40-45%; b. 11/2020 Echo: EF 55%, Gr2 DD; c. 08/2021 Echo: EF 35-40%, glob HK w/ ant/antsept/apical HK; d. 10/2021 s/p CRT-P; e. 12/2021 Echo: EF 30-35%, glob HK, GrIII DD; f. 12/2022 Echo: EF 30-35%, glob HK, sev HK of ant wall, mod asymm LVH, nl RV fxn, mildly dil LA, triv MR, AoV sclerosis.   Hyperlipidemia LDL goal <70    Hypertension    Intermittent  Complete heart block (HCC)    a. 08/2021 noted on Zio; b. 10/2021 s/p Abbott Allure RF CRT-P (Ser # [id number]).   Ischemic cardiomyopathy    a. 08/2020 Echo: EF 40-45%; b. 11/2020 Echo: EF 55%; c. 08/2021 Echo: EF 35-40%; d. 10/2021 s/p CRT-P; e. 12/2021 Echo: EF 30-35%, glob HK, GrIII DD.   PAF (paroxysmal atrial fibrillation) (HCC)    a.Medical device check-->Eliquis (CHA2DS2VASc = 6)   Trifascicular block     Medications:  PTA Meds: Apixaban 2.5 mg BID, last dose unknown  Assessment: Pt is a 88 yo male presenting to ED c/o stabbing chest pain & SOB found w/ BNP of 1620.  Goal of Therapy:  Heparin level 0.3-0.7 units/ml aPTT 66-102 seconds Monitor platelets by anticoagulation protocol: Yes   Results 7/26@1356  aPTT=61 Subtherapeutic @ 1200 un/hr 7/26 2257 aPTT 96  Therapeutic x 1  Plan:  Continue heparin infusion rate at 1400 units/hr Will follow aPTT until correlation w/ HL confirmed Recheck aPTT  w/ AM labs to confirm then daily HL & CBC daily while on heparin  Otelia Sergeant, PharmD, [facility] 06/03/2023 11:33 PM

## 2023-06-04 DIAGNOSIS — Z8674 Personal history of sudden cardiac arrest: Secondary | ICD-10-CM | POA: Diagnosis not present

## 2023-06-04 DIAGNOSIS — I453 Trifascicular block: Secondary | ICD-10-CM | POA: Diagnosis present

## 2023-06-04 DIAGNOSIS — I447 Left bundle-branch block, unspecified: Secondary | ICD-10-CM | POA: Diagnosis present

## 2023-06-04 DIAGNOSIS — E669 Obesity, unspecified: Secondary | ICD-10-CM | POA: Diagnosis present

## 2023-06-04 DIAGNOSIS — I200 Unstable angina: Secondary | ICD-10-CM

## 2023-06-04 DIAGNOSIS — I442 Atrioventricular block, complete: Secondary | ICD-10-CM | POA: Diagnosis present

## 2023-06-04 DIAGNOSIS — Z7901 Long term (current) use of anticoagulants: Secondary | ICD-10-CM | POA: Diagnosis not present

## 2023-06-04 DIAGNOSIS — Z1152 Encounter for screening for COVID-19: Secondary | ICD-10-CM | POA: Diagnosis not present

## 2023-06-04 DIAGNOSIS — N183 Chronic kidney disease, stage 3 unspecified: Secondary | ICD-10-CM | POA: Diagnosis not present

## 2023-06-04 DIAGNOSIS — Z794 Long term (current) use of insulin: Secondary | ICD-10-CM | POA: Diagnosis not present

## 2023-06-04 DIAGNOSIS — Z87891 Personal history of nicotine dependence: Secondary | ICD-10-CM | POA: Diagnosis not present

## 2023-06-04 DIAGNOSIS — I130 Hypertensive heart and chronic kidney disease with heart failure and stage 1 through stage 4 chronic kidney disease, or unspecified chronic kidney disease: Secondary | ICD-10-CM | POA: Diagnosis present

## 2023-06-04 DIAGNOSIS — I10 Essential (primary) hypertension: Secondary | ICD-10-CM | POA: Diagnosis not present

## 2023-06-04 DIAGNOSIS — N1832 Chronic kidney disease, stage 3b: Secondary | ICD-10-CM | POA: Diagnosis present

## 2023-06-04 DIAGNOSIS — I480 Paroxysmal atrial fibrillation: Secondary | ICD-10-CM | POA: Diagnosis present

## 2023-06-04 DIAGNOSIS — R079 Chest pain, unspecified: Secondary | ICD-10-CM | POA: Diagnosis present

## 2023-06-04 DIAGNOSIS — Z7951 Long term (current) use of inhaled steroids: Secondary | ICD-10-CM | POA: Diagnosis not present

## 2023-06-04 DIAGNOSIS — Z6833 Body mass index (BMI) 33.0-33.9, adult: Secondary | ICD-10-CM | POA: Diagnosis not present

## 2023-06-04 DIAGNOSIS — R011 Cardiac murmur, unspecified: Secondary | ICD-10-CM | POA: Diagnosis present

## 2023-06-04 DIAGNOSIS — I25710 Atherosclerosis of autologous vein coronary artery bypass graft(s) with unstable angina pectoris: Secondary | ICD-10-CM | POA: Diagnosis present

## 2023-06-04 DIAGNOSIS — I472 Ventricular tachycardia, unspecified: Secondary | ICD-10-CM | POA: Diagnosis present

## 2023-06-04 DIAGNOSIS — R0789 Other chest pain: Secondary | ICD-10-CM | POA: Diagnosis not present

## 2023-06-04 DIAGNOSIS — I5022 Chronic systolic (congestive) heart failure: Secondary | ICD-10-CM | POA: Diagnosis present

## 2023-06-04 DIAGNOSIS — Z8249 Family history of ischemic heart disease and other diseases of the circulatory system: Secondary | ICD-10-CM | POA: Diagnosis not present

## 2023-06-04 DIAGNOSIS — I25118 Atherosclerotic heart disease of native coronary artery with other forms of angina pectoris: Secondary | ICD-10-CM | POA: Diagnosis not present

## 2023-06-04 DIAGNOSIS — E1122 Type 2 diabetes mellitus with diabetic chronic kidney disease: Secondary | ICD-10-CM | POA: Diagnosis present

## 2023-06-04 DIAGNOSIS — I4891 Unspecified atrial fibrillation: Secondary | ICD-10-CM | POA: Diagnosis not present

## 2023-06-04 DIAGNOSIS — E785 Hyperlipidemia, unspecified: Secondary | ICD-10-CM | POA: Diagnosis present

## 2023-06-04 DIAGNOSIS — I255 Ischemic cardiomyopathy: Secondary | ICD-10-CM

## 2023-06-04 DIAGNOSIS — Z79899 Other long term (current) drug therapy: Secondary | ICD-10-CM | POA: Diagnosis not present

## 2023-06-04 DIAGNOSIS — I25110 Atherosclerotic heart disease of native coronary artery with unstable angina pectoris: Secondary | ICD-10-CM | POA: Diagnosis present

## 2023-06-04 LAB — GLUCOSE, CAPILLARY
Glucose-Capillary: 134 mg/dL — ABNORMAL HIGH (ref 70–99)
Glucose-Capillary: 208 mg/dL — ABNORMAL HIGH (ref 70–99)
Glucose-Capillary: 106 mg/dL — ABNORMAL HIGH (ref 70–99)
Glucose-Capillary: 131 mg/dL — ABNORMAL HIGH (ref 70–99)

## 2023-06-04 LAB — HEPARIN LEVEL (UNFRACTIONATED): Heparin Unfractionated: 0.74 IU/mL — ABNORMAL HIGH (ref 0.30–0.70)

## 2023-06-04 LAB — APTT: aPTT: 130 seconds — ABNORMAL HIGH (ref 24–36)

## 2023-06-04 MED ORDER — EZETIMIBE 10 MG PO TABS
10.0000 mg | ORAL_TABLET | Freq: Every day | ORAL | Status: DC
  Administered 2023-06-04 – 2023-06-05 (×2): 10 mg via ORAL
  Filled 2023-06-04 (×2): qty 1

## 2023-06-04 NOTE — Progress Notes (Signed)
Progress Note  Patient Name: [name] Date of Encounter: 06/04/2023  Primary Cardiologist: Kirke Corin Primary Electrophysiologist:  Lalla Brothers Advanced Heart Failure: Shirlee Latch  Subjective   No acute overnight cardiac events. Chest pain feels "a whole lot better." Notes 1/10 chest pain this morning. No dyspnea, palpitations, dizziness, presyncope, or syncope. Planning for cardiac cath 7/29.   Inpatient Medications    Scheduled Meds:  acyclovir  800 mg Oral Daily   aspirin EC  81 mg Oral Daily   carvedilol  6.25 mg Oral BID WC   hydrALAZINE  25 mg Oral q12n4p   insulin aspart  0-5 Units Subcutaneous QHS   insulin aspart  0-9 Units Subcutaneous TID WC   insulin glargine-yfgn  10 Units Subcutaneous Q2200   isosorbide mononitrate  60 mg Oral Daily   magnesium oxide  400 mg Oral Daily   pantoprazole  40 mg Oral BID   rosuvastatin  40 mg Oral Daily   sacubitril-valsartan  1 tablet Oral BID   spironolactone  12.5 mg Oral Daily   torsemide  40 mg Oral Daily   Vericiguat  10 mg Oral Daily   Continuous Infusions:  heparin 1,400 Units/hr (06/03/23 2158)   PRN Meds: acetaminophen, albuterol, dextromethorphan-guaiFENesin, diphenhydrAMINE, hydrALAZINE, morphine injection, nitroGLYCERIN, oxyCODONE-acetaminophen, polyethylene glycol   Vital Signs    Vitals:   06/03/23 2200 06/03/23 2300 06/03/23 2331 06/04/23 0401  BP:   104/61 121/67  Pulse:   64 (!) 59  Resp: 15 11 18 20   Temp:   98.4 F (36.9 C) 98.2 F (36.8 C)  TempSrc:   Oral   SpO2:   99% 99%  Weight:        Intake/Output Summary (Last 24 hours) at 06/04/2023 0734 Last data filed at 06/04/2023 0300 Gross per 24 hour  Intake 497.79 ml  Output 1450 ml  Net -952.21 ml   Filed Weights   06/03/23 0343  Weight: 99.7 kg    Telemetry    AV paced with rare PVC - Personally Reviewed  ECG    No new tracings - Personally Reviewed  Physical Exam   GEN: No acute distress.   Neck: No JVD. Cardiac: RRR, no murmurs, rubs,  or gallops.  Respiratory: Clear to auscultation bilaterally.  GI: Soft, nontender, non-distended.   MS: No edema; No deformity. Neuro:  Alert and oriented x 3; Nonfocal.  Psych: Normal affect.  Labs    Chemistry Recent Labs  Lab 06/03/23 0347 06/04/23 0537  NA 145 137  K 3.9 4.0  CL 105 104  CO2 24 25  GLUCOSE 137* 134*  BUN 25* 22  CREATININE 1.66* 1.84*  CALCIUM 9.0 8.0*  PROT 6.9  --   ALBUMIN 3.3*  --   AST 13*  --   ALT 10  --   ALKPHOS 64  --   BILITOT 0.5  --   GFRNONAA 40* 35*  ANIONGAP 16* 8     Hematology Recent Labs  Lab 06/03/23 0347 06/04/23 0537  WBC 5.4 3.8*  RBC 4.93 4.40  HGB 13.5 12.2*  HCT 43.6 39.0  MCV 88.4 88.6  MCH 27.4 27.7  MCHC 31.0 31.3  RDW 15.1 15.0  PLT 171 150    Cardiac EnzymesNo results for input(s): "TROPONINI" in the last 168 hours. No results for input(s): "TROPIPOC" in the last 168 hours.   BNP Recent Labs  Lab 06/03/23 0417  BNP 1,620.5*     DDimer No results for input(s): "DDIMER" in the last 168 hours.  Radiology    DG Chest Portable 1 View  Result Date: 06/03/2023 IMPRESSION: 1. No radiographic evidence of acute cardiopulmonary disease. 2. Mild cardiomegaly. 3. Aortic atherosclerosis. Electronically Signed   By: Trudie Reed M.D.   On: 06/03/2023 05:12    Cardiac Studies   Lexiscan MPI 05/30/2023:   Findings are consistent with infarction with peri-infarct ischemia. The study is high risk due to degree of cardiomyopathy   No ST deviation was noted.   LV perfusion is abnormal. There is evidence of ischemia. There is evidence of infarction. Defect 1: There is a medium defect with moderate reduction in uptake present in the apical to mid anterior and anteroseptal location(s) that is partially reversible. There is abnormal wall motion in the defect area. Consistent with infarction and peri-infarct ischemia.   Left ventricular function is abnormal. Nuclear stress EF: 33%. End diastolic cavity size is  moderately enlarged. End systolic cavity size is moderately enlarged.   Suboptimal study due to GI uptake. __________   2D echo 12/16/2022: 1. Left ventricular ejection fraction, by estimation, is 30 to 35%. The  left ventricle has moderately decreased function. The left ventricle  demonstrates global hypokinesis with severe hypokinesis of the anterior  wall. The left ventricular internal  cavity size was mildly dilated. There is moderate asymmetric left  ventricular hypertrophy of the septal segment.   2. Right ventricle is not well visualized. Right ventricular systolic  function is grossly normal. The right ventricular size is normal.  Tricuspid regurgitation signal is inadequate for assessing PA pressure.   3. Left atrial size was mildly dilated.   4. The mitral valve is normal in structure. Trivial mitral valve  regurgitation. No evidence of mitral stenosis.   5. The aortic valve is normal in structure. Aortic valve regurgitation is  not visualized. Aortic valve sclerosis is present, with no evidence of  aortic valve stenosis.   6. The inferior vena cava is normal in size with greater than 50%  respiratory variability, suggesting right atrial pressure of 3 mmHg.   Patient Profile     88 y.o. 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 Clyde Lundborg.   Assessment & Plan    1. CAD status post CABG with unstable angina and abnormal Lexiscan MPI: -Chest pain is improving with titration of Imdur, reports this is a "whole lot better" -High-sensitivity troponin negative x 3 -Patient last took apixaban on the evening of 7/25 -Plan for [facility] on 06/06/2023, sooner if he becomes unstable or has recurrent  angina -N.p.o. at midnight on 7/29 -Continue titrated dose of Imdur 60 mg daily -Continue carvedilol for antianginal effect -Rosuvastatin   2.  HFrEF secondary to ICM: -Appears grossly euvolemic with BMP improved from prior readings -PTA Entresto, carvedilol, Farxiga, Imdur/isosorbide, spironolactone, Verquvo  -Hold torsemide today -With underlying renal dysfunction, we will gently hydrate him beginning on the evening of 7/28 in preparation for cath on 7/29   3.  PAF: -Maintaining sinus rhythm with AV paced rhythm -PTA carvedilol -Heparin drip in place of apixaban in preparation for cardiac cath -Resume DOAC prior to discharge -CHA2DS2-VASc at least 6 (CHF, HTN, age x 2, DM, vascular disease)   4.  History of trifascicular  block: -Status post CRT-P   5.  CKD stage III-IV: -Gentle hydration precath -Hold torsemide today -Monitor renal function   6.  HTN: -Blood pressure well-controlled -Medical therapy as outlined above   7.  HLD: -LDL 109 this admission, target LDL < 55 -PTA rosuvastatin 40 mg, reports adherence to statin -Add Zetia 10 mg -If LDL remains above goal in the outpatient setting, look to add [id number]   Informed Consent   Shared Decision Making/Informed Consent{  The risks [stroke (1 in 1000), death (1 in 1000), kidney failure [usually temporary] (1 in 500), bleeding (1 in 200), allergic reaction [possibly serious] (1 in 200)], benefits (diagnostic support and management of coronary artery disease) and alternatives of a cardiac catheterization were discussed in detail with Mr. Lowary and he is willing to proceed.      For questions or updates, please contact [facility] Please consult [email or web address] for contact info under Cardiology/STEMI.    Signed, Eula Listen, PA-C [facility] Pager: [phone] 06/04/2023, 7:34 AM

## 2023-06-04 NOTE — Progress Notes (Signed)
ANTICOAGULATION CONSULT NOTE  Pharmacy Consult for heparin infusion Indication: ACS/STEMI  Allergies  Allergen Reactions   Angiotensin Receptor Blockers     hyperkalemia   Spironolactone     Hyperkalemia    Metformin Diarrhea    Patient Measurements: Weight: 99.7 kg (219 lb 12.8 oz) Heparin Dosing Weight: 89.8 kg  Vital Signs: Temp: 97.9 F (36.6 C) (07/27 1242) BP: 106/57 (07/27 1242) Pulse Rate: 60 (07/27 1242)  Labs: Recent Labs    06/03/23 0347 06/03/23 0544 06/03/23 1025 06/03/23 1155 06/03/23 1356 06/03/23 1516 06/03/23 2257 06/04/23 0537 06/04/23 1448  HGB 13.5  --   --   --   --   --   --  12.2*  --   HCT 43.6  --   --   --   --   --   --  39.0  --   PLT 171  --   --   --   --   --   --  150  --   APTT  --  28  --   --    < >  --  96* 120* 130*  LABPROT  --  14.2  --   --   --   --   --   --   --   INR  --  1.1  --   --   --   --   --   --   --   HEPARINUNFRC  --  0.22*  --   --   --   --   --  0.66 0.74*  CREATININE 1.66*  --   --   --   --   --   --  1.84*  --   TROPONINIHS 16 16 15 16   --  13  --   --   --    < > = values in this interval not displayed.    Estimated Creatinine Clearance: 33.6 mL/min (A) (by C-G formula based on SCr of 1.84 mg/dL (H)).   Medical History: Past Medical History:  Diagnosis Date   AV block, Mobitz 1    CAD (coronary artery disease)    a. 08/2019 VF arrest-->sev 3vd on cath-->CABGx5 (LIMA->LAD, VG->OM->LCX, VG->RPDA, VG->Diag); b. 08/2021 Cath: sev native multivessel dzs w/ 5/5 patent grafts. Nl filling pressures->Med rx.   CKD (chronic kidney disease), stage III (HCC)    Diabetes mellitus without complication (HCC)    HFrEF (heart failure with reduced ejection fraction) (HCC)    a. 08/2020 Echo: EF 40-45%; b. 11/2020 Echo: EF 55%, Gr2 DD; c. 08/2021 Echo: EF 35-40%, glob HK w/ ant/antsept/apical HK; d. 10/2021 s/p CRT-P; e. 12/2021 Echo: EF 30-35%, glob HK, GrIII DD; f. 12/2022 Echo: EF 30-35%, glob HK, sev HK of ant  wall, mod asymm LVH, nl RV fxn, mildly dil LA, triv MR, AoV sclerosis.   Hyperlipidemia LDL goal <70    Hypertension    Intermittent Complete heart block (HCC)    a. 08/2021 noted on Zio; b. 10/2021 s/p Abbott Allure RF CRT-P (Ser # [id number]).   Ischemic cardiomyopathy    a. 08/2020 Echo: EF 40-45%; b. 11/2020 Echo: EF 55%; c. 08/2021 Echo: EF 35-40%; d. 10/2021 s/p CRT-P; e. 12/2021 Echo: EF 30-35%, glob HK, GrIII DD.   PAF (paroxysmal atrial fibrillation) (HCC)    a.Medical device check-->Eliquis (CHA2DS2VASc = 6)   Trifascicular block     Medications:  PTA Meds: Apixaban 2.5 mg BID, last dose unknown  Assessment:  Pt is a 88 yo male presenting to ED c/o stabbing chest pain & SOB found w/ BNP of 1620.  Goal of Therapy:  Heparin level 0.3-0.7 units/ml aPTT 66-102 seconds Monitor platelets by anticoagulation protocol: Yes   Results 7/26@1356  aPTT=61 Subtherapeutic @ 1200 un/hr 7/26 2257 aPTT 96 Therapeutic x 1 7/27 0537 aPTT 120 supratherapeutic / HL 0.66 therapeutic x 1 7/27 1448  aPTT 130- supratherapeutic / HL 0.74 - slightly supratherapeutic   Plan:  Decrease heparin infusion rate to 1200 units/hr Heparin level correlating with aPTT. Will transition to dosing via HL. Recheck HL 8 hr after rate change CBC daily while on heparin   Elliot Gurney, PharmD, BCPS Clinical Pharmacist  06/04/2023 3:25 PM

## 2023-06-04 NOTE — Progress Notes (Signed)
PROGRESS NOTE    [name]  WUJ:[id number] DOB: 07/26/1935 DOA: 06/03/2023 PCP: Idelia Salm, MD   Assessment & Plan:   Principal Problem:   Chest pain Active Problems:   CAD, multiple vessel   HTN (hypertension)   Chronic systolic CHF (congestive heart failure) (HCC)   HLD (hyperlipidemia)   Type II diabetes mellitus with renal manifestations (HCC)   Dyslipidemia   Paroxysmal atrial fibrillation (HCC)   CKD stage 3b, GFR 30-44 ml/min (HCC)   Obesity (BMI 30-39.9)  Assessment and Plan: Chest pain: w/ hx of CAD s/p of CABG and multiple stent placement. Troponins are neg x 3. Continue on IV heparin. Continue on coreg, aldactone, entresto, aspirin, statin. Cardiac cath [facility] on 06/06/23 as per cardio    HTN: continue on coreg, aldactone, entresto, imdur, hydralazine    Chronic systolic CHF: echo on 12/2022 showed EF 30-35%.  BNP 1620, trace leg edema, no pulm edema chest x-ray, does not seem to have acute CHF exacerbation. Continue on coreg, aldactone, entresto & vericiguat. Holding torsemide   DM2: improved control w/ HbA1c 7.0. Continue on glargine, SSI w/ accuchecks   HLD: continue on statin    PAF: continue on coreg and IV heparin. Holding eliquis    CKDIIIb: Cr is labile. Avoid nephrotoxic meds   Obesity: BMI 33.4. Would benefit from weight loss        DVT prophylaxis: IV heparin  Code Status:  full  Family Communication: called pt's wife, Talbert Forest, but no answer so I left a voicemail  Disposition Plan: likely d/c back home   Level of care: Progressive Status is: Observation The patient remains OBS appropriate and will d/c before 2 midnights.     Consultants:  Cardio   Procedures:   Antimicrobials:    Subjective: Pt c/o intermittent chest pain.   Objective: Vitals:   06/03/23 2331 06/04/23 0401 06/04/23 0803 06/04/23 1242  BP: 104/61 121/67 118/64 (!) 106/57  Pulse: 64 (!) 59 61 60  Resp: 18 20 18 20   Temp: 98.4 F (36.9 C) 98.2 F  (36.8 C) 97.6 F (36.4 C) 97.9 F (36.6 C)  TempSrc: Oral     SpO2: 99% 99% 98% 97%  Weight:        Intake/Output Summary (Last 24 hours) at 06/04/2023 1414 Last data filed at 06/04/2023 0931 Gross per 24 hour  Intake 497.79 ml  Output 1925 ml  Net -1427.21 ml   Filed Weights   06/03/23 0343  Weight: 99.7 kg    Examination:  General exam: Appears calm and comfortable  Respiratory system: Clear to auscultation. Respiratory effort normal. Cardiovascular system: S1 & S2 +. No rubs, gallops or clicks.  Gastrointestinal system: Abdomen is obese, soft and nontender. Normal bowel sounds heard. Central nervous system: Alert and oriented. Moves all extremities  Psychiatry: Judgement and insight appear normal. Flat mood and affect     Data Reviewed: I have personally reviewed following labs and imaging studies  CBC: Recent Labs  Lab 06/03/23 0347 06/04/23 0537  WBC 5.4 3.8*  NEUTROABS 3.6  --   HGB 13.5 12.2*  HCT 43.6 39.0  MCV 88.4 88.6  PLT 171 150   Basic Metabolic Panel: Recent Labs  Lab 06/03/23 0347 06/03/23 0417 06/04/23 0537  NA 145  --  137  K 3.9  --  4.0  CL 105  --  104  CO2 24  --  25  GLUCOSE 137*  --  134*  BUN 25*  --  22  CREATININE 1.66*  --  1.84*  CALCIUM 9.0  --  8.0*  MG  --  2.1 2.1   GFR: Estimated Creatinine Clearance: 33.6 mL/min (A) (by C-G formula based on SCr of 1.84 mg/dL (H)). Liver Function Tests: Recent Labs  Lab 06/03/23 0347  AST 13*  ALT 10  ALKPHOS 64  BILITOT 0.5  PROT 6.9  ALBUMIN 3.3*   No results for input(s): "LIPASE", "AMYLASE" in the last 168 hours. No results for input(s): "AMMONIA" in the last 168 hours. Coagulation Profile: Recent Labs  Lab 06/03/23 0544  INR 1.1   Cardiac Enzymes: No results for input(s): "CKTOTAL", "CKMB", "CKMBINDEX", "TROPONINI" in the last 168 hours. BNP (last 3 results) No results for input(s): "PROBNP" in the last 8760 hours. HbA1C: Recent Labs    06/03/23 0544   HGBA1C 7.0*   CBG: Recent Labs  Lab 06/03/23 1305 06/03/23 1655 06/03/23 2107 06/04/23 0804 06/04/23 1241  GLUCAP 117* 181* 136* 134* 208*   Lipid Profile: Recent Labs    06/03/23 0544  CHOL 169  HDL 47  LDLCALC 109*  TRIG 66  CHOLHDL 3.6   Thyroid Function Tests: No results for input(s): "TSH", "T4TOTAL", "FREET4", "T3FREE", "THYROIDAB" in the last 72 hours. Anemia Panel: No results for input(s): "VITAMINB12", "FOLATE", "FERRITIN", "TIBC", "IRON", "RETICCTPCT" in the last 72 hours. Sepsis Labs: No results for input(s): "PROCALCITON", "LATICACIDVEN" in the last 168 hours.  Recent Results (from the past 240 hour(s))  SARS Coronavirus 2 by RT PCR (hospital order, performed in [facility] hospital lab) *cepheid single result test* Anterior Nasal Swab     Status: None   Collection Time: 06/03/23  4:17 AM   Specimen: Anterior Nasal Swab  Result Value Ref Range Status   SARS Coronavirus 2 by RT PCR NEGATIVE NEGATIVE Final    Comment: (NOTE) SARS-CoV-2 target nucleic acids are NOT DETECTED.  The SARS-CoV-2 RNA is generally detectable in upper and lower respiratory specimens during the acute phase of infection. The lowest concentration of SARS-CoV-2 viral copies this assay can detect is 250 copies / mL. A negative result does not preclude SARS-CoV-2 infection and should not be used as the sole basis for treatment or other patient management decisions.  A negative result may occur with improper specimen collection / handling, submission of specimen other than nasopharyngeal swab, presence of viral mutation(s) within the areas targeted by this assay, and inadequate number of viral copies (<250 copies / mL). A negative result must be combined with clinical observations, patient history, and epidemiological information.  Fact Sheet for Patients:   RoadLapTop.co.za  Fact Sheet for Healthcare  Providers: http://kim-miller.com/  This test is not yet approved or  cleared by the [place] FDA and has been authorized for detection and/or diagnosis of SARS-CoV-2 by FDA under an Emergency Use Authorization (EUA).  This EUA will remain in effect (meaning this test can be used) for the duration of the COVID-19 declaration under Section 564(b)(1) of the Act, 21 U.S.C. section 360bbb-3(b)(1), unless the authorization is terminated or revoked sooner.  Performed at [facility], [address] [place], [place] [address]          Radiology Studies: DG Chest Portable 1 View  Result Date: 06/03/2023 CLINICAL DATA:  88 year old male with history of shortness of breath and stabbing chest pain. EXAM: PORTABLE CHEST 1 VIEW COMPARISON:  Chest x-ray 05/22/2023. FINDINGS: Lung volumes are normal. No consolidative airspace disease. No pleural effusions. No pneumothorax. No evidence of pulmonary edema. Heart size  is mildly enlarged. Upper mediastinal contours are within normal limits. Atherosclerosis in the thoracic aorta. Status post median sternotomy for CABG and left atrial appendage ligation (a ligation clip is noted). Left-sided pacemaker device in place with lead tips projecting over the expected location of the right atrium and right ventricle. 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 IMPRESSION: 1. No radiographic evidence of acute cardiopulmonary disease. 2. Mild cardiomegaly. 3. Aortic atherosclerosis. Electronically Signed   By: Trudie Reed M.D.   On: 06/03/2023 05:12        Scheduled Meds:  acyclovir  800 mg Oral Daily   aspirin EC  81 mg Oral Daily   carvedilol  6.25 mg Oral BID WC   ezetimibe  10 mg Oral Daily   hydrALAZINE  25 mg Oral q12n4p   insulin aspart  0-5 Units Subcutaneous QHS   insulin aspart  0-9 Units Subcutaneous TID WC    insulin glargine-yfgn  10 Units Subcutaneous Q2200   isosorbide mononitrate  60 mg Oral Daily   magnesium oxide  400 mg Oral Daily   pantoprazole  40 mg Oral BID   rosuvastatin  40 mg Oral Daily   sacubitril-valsartan  1 tablet Oral BID   spironolactone  12.5 mg Oral Daily   Vericiguat  10 mg Oral Daily   Continuous Infusions:  heparin 1,300 Units/hr (06/04/23 1350)     LOS: 0 days    Time spent: 35 mins     Charise Killian, MD [facility] Pager [phone]  If 7PM-7AM, please contact night-coverage [email or web address] 06/04/2023, 2:14 PM

## 2023-06-04 NOTE — Progress Notes (Signed)
ANTICOAGULATION CONSULT NOTE  Pharmacy Consult for heparin infusion Indication: ACS/STEMI  Allergies  Allergen Reactions   Angiotensin Receptor Blockers     hyperkalemia   Spironolactone     Hyperkalemia    Metformin Diarrhea    Patient Measurements: Weight: 99.7 kg (219 lb 12.8 oz) Heparin Dosing Weight: 89.8 kg  Vital Signs: Temp: 98.2 F (36.8 C) (07/27 0401) Temp Source: Oral (07/26 2331) BP: 121/67 (07/27 0401) Pulse Rate: 59 (07/27 0401)  Labs: Recent Labs    06/03/23 0347 06/03/23 0347 06/03/23 0544 06/03/23 1025 06/03/23 1155 06/03/23 1356 06/03/23 1516 06/03/23 2257 06/04/23 0537  HGB 13.5  --   --   --   --   --   --   --  12.2*  HCT 43.6  --   --   --   --   --   --   --  39.0  PLT 171  --   --   --   --   --   --   --  150  APTT  --    < > 28  --   --  61*  --  96* 120*  LABPROT  --   --  14.2  --   --   --   --   --   --   INR  --   --  1.1  --   --   --   --   --   --   HEPARINUNFRC  --   --  0.22*  --   --   --   --   --  0.66  CREATININE 1.66*  --   --   --   --   --   --   --   --   TROPONINIHS 16  --  16 15 16   --  13  --   --    < > = values in this interval not displayed.    Estimated Creatinine Clearance: 37.2 mL/min (A) (by C-G formula based on SCr of 1.66 mg/dL (H)).   Medical History: Past Medical History:  Diagnosis Date   AV block, Mobitz 1    CAD (coronary artery disease)    a. 08/2019 VF arrest-->sev 3vd on cath-->CABGx5 (LIMA->LAD, VG->OM->LCX, VG->RPDA, VG->Diag); b. 08/2021 Cath: sev native multivessel dzs w/ 5/5 patent grafts. Nl filling pressures->Med rx.   CKD (chronic kidney disease), stage III (HCC)    Diabetes mellitus without complication (HCC)    HFrEF (heart failure with reduced ejection fraction) (HCC)    a. 08/2020 Echo: EF 40-45%; b. 11/2020 Echo: EF 55%, Gr2 DD; c. 08/2021 Echo: EF 35-40%, glob HK w/ ant/antsept/apical HK; d. 10/2021 s/p CRT-P; e. 12/2021 Echo: EF 30-35%, glob HK, GrIII DD; f. 12/2022 Echo: EF  30-35%, glob HK, sev HK of ant wall, mod asymm LVH, nl RV fxn, mildly dil LA, triv MR, AoV sclerosis.   Hyperlipidemia LDL goal <70    Hypertension    Intermittent Complete heart block (HCC)    a. 08/2021 noted on Zio; b. 10/2021 s/p Abbott Allure RF CRT-P (Ser # [id number]).   Ischemic cardiomyopathy    a. 08/2020 Echo: EF 40-45%; b. 11/2020 Echo: EF 55%; c. 08/2021 Echo: EF 35-40%; d. 10/2021 s/p CRT-P; e. 12/2021 Echo: EF 30-35%, glob HK, GrIII DD.   PAF (paroxysmal atrial fibrillation) (HCC)    a.Medical device check-->Eliquis (CHA2DS2VASc = 6)   Trifascicular block     Medications:  PTA Meds:  Apixaban 2.5 mg BID, last dose unknown  Assessment: Pt is a 88 yo male presenting to ED c/o stabbing chest pain & SOB found w/ BNP of 1620.  Goal of Therapy:  Heparin level 0.3-0.7 units/ml aPTT 66-102 seconds Monitor platelets by anticoagulation protocol: Yes   Results 7/26@1356  aPTT=61 Subtherapeutic @ 1200 un/hr 7/26 2257 aPTT 96 Therapeutic x 1 7/27 0537 aPTT 120 supratherapeutic / HL 0.66 therapeutic x 1  Plan:  Decrease heparin infusion rate to 1300 units/hr Will follow aPTT until correlation w/ HL confirmed Recheck aPTT and HL in 8 hr after rate change CBC daily while on heparin  Otelia Sergeant, PharmD, [facility] 06/04/2023 6:35 AM

## 2023-06-05 DIAGNOSIS — R079 Chest pain, unspecified: Secondary | ICD-10-CM | POA: Diagnosis not present

## 2023-06-05 DIAGNOSIS — N1832 Chronic kidney disease, stage 3b: Secondary | ICD-10-CM | POA: Diagnosis not present

## 2023-06-05 DIAGNOSIS — I10 Essential (primary) hypertension: Secondary | ICD-10-CM | POA: Diagnosis not present

## 2023-06-05 LAB — GLUCOSE, CAPILLARY
Glucose-Capillary: 110 mg/dL — ABNORMAL HIGH (ref 70–99)
Glucose-Capillary: 200 mg/dL — ABNORMAL HIGH (ref 70–99)
Glucose-Capillary: 89 mg/dL (ref 70–99)
Glucose-Capillary: 149 mg/dL — ABNORMAL HIGH (ref 70–99)

## 2023-06-05 LAB — BASIC METABOLIC PANEL
Anion gap: 8 (ref 5–15)
BUN: 23 mg/dL (ref 8–23)
CO2: 24 mmol/L (ref 22–32)
Calcium: 8.1 mg/dL — ABNORMAL LOW (ref 8.9–10.3)
Chloride: 106 mmol/L (ref 98–111)
Creatinine, Ser: 1.58 mg/dL — ABNORMAL HIGH (ref 0.61–1.24)
GFR, Estimated: 43 mL/min — ABNORMAL LOW (ref >60)
Glucose, Bld: 135 mg/dL — ABNORMAL HIGH (ref 70–99)
Potassium: 3.5 mmol/L (ref 3.5–5.1)
Sodium: 138 mmol/L (ref 135–145)

## 2023-06-05 LAB — CBC
HCT: 38.9 % — ABNORMAL LOW (ref 39.0–52.0)
Hemoglobin: 12.5 g/dL — ABNORMAL LOW (ref 13.0–17.0)
MCH: 27.8 pg (ref 26.0–34.0)
MCHC: 32.1 g/dL (ref 30.0–36.0)
MCV: 86.4 fL (ref 80.0–100.0)
Platelets: 138 10*3/uL — ABNORMAL LOW (ref 150–400)
RBC: 4.5 MIL/uL (ref 4.22–5.81)
RDW: 14.9 % (ref 11.5–15.5)
WBC: 3.5 10*3/uL — ABNORMAL LOW (ref 4.0–10.5)
nRBC: 0 % (ref 0.0–0.2)

## 2023-06-05 LAB — HEPARIN LEVEL (UNFRACTIONATED)
Heparin Unfractionated: 0.5 IU/mL (ref 0.30–0.70)
Heparin Unfractionated: 0.56 [IU]/mL (ref 0.30–0.70)

## 2023-06-05 LAB — MAGNESIUM: Magnesium: 2.2 mg/dL (ref 1.7–2.4)

## 2023-06-05 MED ORDER — ASPIRIN 81 MG PO CHEW
81.0000 mg | CHEWABLE_TABLET | ORAL | Status: AC
  Administered 2023-06-06: 81 mg via ORAL
  Filled 2023-06-05: qty 1

## 2023-06-05 MED ORDER — SODIUM CHLORIDE 0.9 % IV SOLN: INTRAVENOUS | Status: DC

## 2023-06-05 NOTE — Progress Notes (Signed)
ANTICOAGULATION CONSULT NOTE  Pharmacy Consult for heparin infusion Indication: ACS/STEMI  Allergies  Allergen Reactions   Angiotensin Receptor Blockers     hyperkalemia   Spironolactone     Hyperkalemia    Metformin Diarrhea    Patient Measurements: Weight: 99.7 kg (219 lb 12.8 oz) Heparin Dosing Weight: 89.8 kg  Vital Signs: Temp: 98.3 F (36.8 C) (07/27 2357) Temp Source: Oral (07/27 2357) BP: 126/61 (07/27 2357) Pulse Rate: 60 (07/27 2357)  Labs: Recent Labs    06/03/23 0347 06/03/23 0347 06/03/23 0544 06/03/23 1025 06/03/23 1155 06/03/23 1356 06/03/23 1516 06/03/23 2257 06/04/23 0537 06/04/23 1448 06/05/23 0022  HGB 13.5  --   --   --   --   --   --   --  12.2*  --   --   HCT 43.6  --   --   --   --   --   --   --  39.0  --   --   PLT 171  --   --   --   --   --   --   --  150  --   --   APTT  --   --  28  --   --    < >  --  96* 120* 130*  --   LABPROT  --   --  14.2  --   --   --   --   --   --   --   --   INR  --   --  1.1  --   --   --   --   --   --   --   --   HEPARINUNFRC  --    < > 0.22*  --   --   --   --   --  0.66 0.74* 0.56  CREATININE 1.66*  --   --   --   --   --   --   --  1.84*  --   --   TROPONINIHS 16  --  16 15 16   --  13  --   --   --   --    < > = values in this interval not displayed.    Estimated Creatinine Clearance: 33.6 mL/min (A) (by C-G formula based on SCr of 1.84 mg/dL (H)).   Medical History: Past Medical History:  Diagnosis Date   AV block, Mobitz 1    CAD (coronary artery disease)    a. 08/2019 VF arrest-->sev 3vd on cath-->CABGx5 (LIMA->LAD, VG->OM->LCX, VG->RPDA, VG->Diag); b. 08/2021 Cath: sev native multivessel dzs w/ 5/5 patent grafts. Nl filling pressures->Med rx.   CKD (chronic kidney disease), stage III (HCC)    Diabetes mellitus without complication (HCC)    HFrEF (heart failure with reduced ejection fraction) (HCC)    a. 08/2020 Echo: EF 40-45%; b. 11/2020 Echo: EF 55%, Gr2 DD; c. 08/2021 Echo: EF 35-40%,  glob HK w/ ant/antsept/apical HK; d. 10/2021 s/p CRT-P; e. 12/2021 Echo: EF 30-35%, glob HK, GrIII DD; f. 12/2022 Echo: EF 30-35%, glob HK, sev HK of ant wall, mod asymm LVH, nl RV fxn, mildly dil LA, triv MR, AoV sclerosis.   Hyperlipidemia LDL goal <70    Hypertension    Intermittent Complete heart block (HCC)    a. 08/2021 noted on Zio; b. 10/2021 s/p Abbott Allure RF CRT-P (Ser # [id number]).   Ischemic cardiomyopathy    a. 08/2020 Echo:  EF 40-45%; b. 11/2020 Echo: EF 55%; c. 08/2021 Echo: EF 35-40%; d. 10/2021 s/p CRT-P; e. 12/2021 Echo: EF 30-35%, glob HK, GrIII DD.   PAF (paroxysmal atrial fibrillation) (HCC)    a.Medical device check-->Eliquis (CHA2DS2VASc = 6)   Trifascicular block     Medications:  PTA Meds: Apixaban 2.5 mg BID, last dose unknown  Assessment: Pt is a 88 yo male presenting to ED c/o stabbing chest pain & SOB found w/ BNP of 1620.  Goal of Therapy:  Heparin level 0.3-0.7 units/ml aPTT 66-102 seconds Monitor platelets by anticoagulation protocol: Yes   Results 7/26@1356  aPTT=61 Subtherapeutic @ 1200 un/hr 7/26 2257 aPTT 96 Therapeutic x 1 7/27 0537 aPTT 120 supratherapeutic / HL 0.66 therapeutic x 1 7/27 1448  aPTT 130- supratherapeutic / HL 0.74 - slightly supratherapeutic 7/28 0022 HL 0.56 therapeutic x 1  Plan:  Contiune heparin infusion rate at 1200 units/hr Recheck HL in 8 hrs to confirm, then daily CBC daily while on heparin  Otelia Sergeant, PharmD, [facility] 06/05/2023 1:29 AM

## 2023-06-05 NOTE — Progress Notes (Signed)
Progress Note  Patient Name: [name] Date of Encounter: 06/05/2023  Primary Cardiologist: Kirke Corin Primary Electrophysiologist:  Lalla Brothers Advanced Heart Failure: Shirlee Latch  Subjective   No acute overnight cardiac events. No chest pain, dyspnea, palpitations, dizziness, presyncope, or syncope. Planning for cardiac cath 7/29.   Inpatient Medications    Scheduled Meds:  acyclovir  800 mg Oral Daily   aspirin EC  81 mg Oral Daily   carvedilol  6.25 mg Oral BID WC   ezetimibe  10 mg Oral Daily   hydrALAZINE  25 mg Oral q12n4p   insulin aspart  0-5 Units Subcutaneous QHS   insulin aspart  0-9 Units Subcutaneous TID WC   insulin glargine-yfgn  10 Units Subcutaneous Q2200   isosorbide mononitrate  60 mg Oral Daily   magnesium oxide  400 mg Oral Daily   pantoprazole  40 mg Oral BID   rosuvastatin  40 mg Oral Daily   sacubitril-valsartan  1 tablet Oral BID   spironolactone  12.5 mg Oral Daily   Vericiguat  10 mg Oral Daily   Continuous Infusions:  heparin 1,200 Units/hr (06/04/23 1733)   PRN Meds: acetaminophen, albuterol, dextromethorphan-guaiFENesin, diphenhydrAMINE, hydrALAZINE, morphine injection, nitroGLYCERIN, oxyCODONE-acetaminophen, polyethylene glycol   Vital Signs    Vitals:   06/04/23 2004 06/04/23 2357 06/05/23 0430 06/05/23 0819  BP: 126/78 126/61 (!) 146/72 (!) 125/58  Pulse: 65 60 60 64  Resp: 18 16 18 20   Temp: 97.8 F (36.6 C) 98.3 F (36.8 C) 97.7 F (36.5 C) 98.6 F (37 C)  TempSrc: Oral Oral Oral Oral  SpO2: 100% 98% 98% 96%  Weight:        Intake/Output Summary (Last 24 hours) at 06/05/2023 0832 Last data filed at 06/04/2023 1919 Gross per 24 hour  Intake 898.01 ml  Output 550 ml  Net 348.01 ml   Filed Weights   06/03/23 0343  Weight: 99.7 kg    Telemetry    AV paced with 12 beat run of NSVT - Personally Reviewed  ECG    No new tracings - Personally Reviewed  Physical Exam   GEN: No acute distress.   Neck: No JVD. Cardiac: RRR,  no murmurs, rubs, or gallops.  Respiratory: Clear to auscultation bilaterally.  GI: Soft, nontender, non-distended.   MS: No edema; No deformity. Neuro:  Alert and oriented x 3; Nonfocal.  Psych: Normal affect.  Labs    Chemistry Recent Labs  Lab 06/03/23 0347 06/04/23 0537  NA 145 137  K 3.9 4.0  CL 105 104  CO2 24 25  GLUCOSE 137* 134*  BUN 25* 22  CREATININE 1.66* 1.84*  CALCIUM 9.0 8.0*  PROT 6.9  --   ALBUMIN 3.3*  --   AST 13*  --   ALT 10  --   ALKPHOS 64  --   BILITOT 0.5  --   GFRNONAA 40* 35*  ANIONGAP 16* 8     Hematology Recent Labs  Lab 06/03/23 0347 06/04/23 0537 06/05/23 0431  WBC 5.4 3.8* 3.5*  RBC 4.93 4.40 4.50  HGB 13.5 12.2* 12.5*  HCT 43.6 39.0 38.9*  MCV 88.4 88.6 86.4  MCH 27.4 27.7 27.8  MCHC 31.0 31.3 32.1  RDW 15.1 15.0 14.9  PLT 171 150 138*    Cardiac EnzymesNo results for input(s): "TROPONINI" in the last 168 hours. No results for input(s): "TROPIPOC" in the last 168 hours.   BNP Recent Labs  Lab 06/03/23 0417  BNP 1,620.5*     DDimer No  results for input(s): "DDIMER" in the last 168 hours.   Radiology    DG Chest Portable 1 View  Result Date: 06/03/2023 IMPRESSION: 1. No radiographic evidence of acute cardiopulmonary disease. 2. Mild cardiomegaly. 3. Aortic atherosclerosis. Electronically Signed   By: Trudie Reed M.D.   On: 06/03/2023 05:12    Cardiac Studies   Lexiscan MPI 05/30/2023:   Findings are consistent with infarction with peri-infarct ischemia. The study is high risk due to degree of cardiomyopathy   No ST deviation was noted.   LV perfusion is abnormal. There is evidence of ischemia. There is evidence of infarction. Defect 1: There is a medium defect with moderate reduction in uptake present in the apical to mid anterior and anteroseptal location(s) that is partially reversible. There is abnormal wall motion in the defect area. Consistent with infarction and peri-infarct ischemia.   Left ventricular  function is abnormal. Nuclear stress EF: 33%. End diastolic cavity size is moderately enlarged. End systolic cavity size is moderately enlarged.   Suboptimal study due to GI uptake. __________   2D echo 12/16/2022: 1. Left ventricular ejection fraction, by estimation, is 30 to 35%. The  left ventricle has moderately decreased function. The left ventricle  demonstrates global hypokinesis with severe hypokinesis of the anterior  wall. The left ventricular internal  cavity size was mildly dilated. There is moderate asymmetric left  ventricular hypertrophy of the septal segment.   2. Right ventricle is not well visualized. Right ventricular systolic  function is grossly normal. The right ventricular size is normal.  Tricuspid regurgitation signal is inadequate for assessing PA pressure.   3. Left atrial size was mildly dilated.   4. The mitral valve is normal in structure. Trivial mitral valve  regurgitation. No evidence of mitral stenosis.   5. The aortic valve is normal in structure. Aortic valve regurgitation is  not visualized. Aortic valve sclerosis is present, with no evidence of  aortic valve stenosis.   6. The inferior vena cava is normal in size with greater than 50%  respiratory variability, suggesting right atrial pressure of 3 mmHg.   Patient Profile     88 y.o. 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 Clyde Lundborg.   Assessment & Plan    1. CAD status post CABG with unstable angina and abnormal Lexiscan MPI: -Currently, without symptoms of angina or cardiac decompensation  -High-sensitivity troponin negative x 3 -Patient last took apixaban on the evening of 7/25 -Plan for [facility] on 06/06/2023, sooner  if he becomes unstable or has recurrent angina -N.p.o. at midnight on 7/29 -Continue titrated dose of Imdur 60 mg daily -Continue carvedilol for antianginal effect -Rosuvastatin   2.  HFrEF secondary to ICM: -Appears grossly euvolemic with BMP improved from prior readings -PTA Entresto, carvedilol, Farxiga, Imdur/isosorbide, spironolactone, Verquvo  -Torsemide held 7/27, renal function pending this morning  -With underlying renal dysfunction, we will gently hydrate him beginning on the evening of 7/28 in preparation for cath on 7/29   3.  PAF: -Maintaining sinus rhythm with AV paced rhythm -PTA carvedilol -Heparin drip in place of apixaban in preparation for cardiac cath -Resume DOAC prior to discharge -CHA2DS2-VASc at least 6 (CHF, HTN, age x  2, DM, vascular disease)   4.  History of trifascicular block: -Status post CRT-P   5.  CKD stage III-IV: -Gentle hydration precath -Torsemide held 7/27 -Monitor renal function, pending today   6.  HTN: -Blood pressure well-controlled -Medical therapy as outlined above   7.  HLD: -LDL 109 this admission, target LDL < 55 -PTA rosuvastatin 40 mg, reports adherence to statin -Now on Zetia 10 mg -If LDL remains above goal in the outpatient setting, look to add PCSK9i  8. NSVT: -Coreg -LHC planned for tomorrow -BMP pending, replete potassium to goal 4.0 as indicated -Check magnesium with recommendation to replete to goal 2.0 as indicated   Informed Consent   Shared Decision Making/Informed Consent{  The risks [stroke (1 in 1000), death (1 in 1000), kidney failure [usually temporary] (1 in 500), bleeding (1 in 200), allergic reaction [possibly serious] (1 in 200)], benefits (diagnostic support and management of coronary artery disease) and alternatives of a cardiac catheterization were discussed in detail with Mr. Burruel and he is willing to proceed.      For questions or updates, please contact [facility] Please consult  [email or web address] for contact info under Cardiology/STEMI.    Signed, Eula Listen, PA-C [facility] Pager: [phone] 06/05/2023, 8:32 AM

## 2023-06-05 NOTE — H&P (View-Only) (Signed)
Progress Note  Patient Name: [name] Date of Encounter: 06/05/2023  Primary Cardiologist: Kirke Corin Primary Electrophysiologist:  Lalla Brothers Advanced Heart Failure: Shirlee Latch  Subjective   No acute overnight cardiac events. No chest pain, dyspnea, palpitations, dizziness, presyncope, or syncope. Planning for cardiac cath 7/29.   Inpatient Medications    Scheduled Meds:  acyclovir  800 mg Oral Daily   aspirin EC  81 mg Oral Daily   carvedilol  6.25 mg Oral BID WC   ezetimibe  10 mg Oral Daily   hydrALAZINE  25 mg Oral q12n4p   insulin aspart  0-5 Units Subcutaneous QHS   insulin aspart  0-9 Units Subcutaneous TID WC   insulin glargine-yfgn  10 Units Subcutaneous Q2200   isosorbide mononitrate  60 mg Oral Daily   magnesium oxide  400 mg Oral Daily   pantoprazole  40 mg Oral BID   rosuvastatin  40 mg Oral Daily   sacubitril-valsartan  1 tablet Oral BID   spironolactone  12.5 mg Oral Daily   Vericiguat  10 mg Oral Daily   Continuous Infusions:  heparin 1,200 Units/hr (06/04/23 1733)   PRN Meds: acetaminophen, albuterol, dextromethorphan-guaiFENesin, diphenhydrAMINE, hydrALAZINE, morphine injection, nitroGLYCERIN, oxyCODONE-acetaminophen, polyethylene glycol   Vital Signs    Vitals:   06/04/23 2004 06/04/23 2357 06/05/23 0430 06/05/23 0819  BP: 126/78 126/61 (!) 146/72 (!) 125/58  Pulse: 65 60 60 64  Resp: 18 16 18 20   Temp: 97.8 F (36.6 C) 98.3 F (36.8 C) 97.7 F (36.5 C) 98.6 F (37 C)  TempSrc: Oral Oral Oral Oral  SpO2: 100% 98% 98% 96%  Weight:        Intake/Output Summary (Last 24 hours) at 06/05/2023 0832 Last data filed at 06/04/2023 1919 Gross per 24 hour  Intake 898.01 ml  Output 550 ml  Net 348.01 ml   Filed Weights   06/03/23 0343  Weight: 99.7 kg    Telemetry    AV paced with 12 beat run of NSVT - Personally Reviewed  ECG    No new tracings - Personally Reviewed  Physical Exam   GEN: No acute distress.   Neck: No JVD. Cardiac: RRR,  no murmurs, rubs, or gallops.  Respiratory: Clear to auscultation bilaterally.  GI: Soft, nontender, non-distended.   MS: No edema; No deformity. Neuro:  Alert and oriented x 3; Nonfocal.  Psych: Normal affect.  Labs    Chemistry Recent Labs  Lab 06/03/23 0347 06/04/23 0537  NA 145 137  K 3.9 4.0  CL 105 104  CO2 24 25  GLUCOSE 137* 134*  BUN 25* 22  CREATININE 1.66* 1.84*  CALCIUM 9.0 8.0*  PROT 6.9  --   ALBUMIN 3.3*  --   AST 13*  --   ALT 10  --   ALKPHOS 64  --   BILITOT 0.5  --   GFRNONAA 40* 35*  ANIONGAP 16* 8     Hematology Recent Labs  Lab 06/03/23 0347 06/04/23 0537 06/05/23 0431  WBC 5.4 3.8* 3.5*  RBC 4.93 4.40 4.50  HGB 13.5 12.2* 12.5*  HCT 43.6 39.0 38.9*  MCV 88.4 88.6 86.4  MCH 27.4 27.7 27.8  MCHC 31.0 31.3 32.1  RDW 15.1 15.0 14.9  PLT 171 150 138*    Cardiac EnzymesNo results for input(s): "TROPONINI" in the last 168 hours. No results for input(s): "TROPIPOC" in the last 168 hours.   BNP Recent Labs  Lab 06/03/23 0417  BNP 1,620.5*     DDimer No  results for input(s): "DDIMER" in the last 168 hours.   Radiology    DG Chest Portable 1 View  Result Date: 06/03/2023 IMPRESSION: 1. No radiographic evidence of acute cardiopulmonary disease. 2. Mild cardiomegaly. 3. Aortic atherosclerosis. Electronically Signed   By: Trudie Reed M.D.   On: 06/03/2023 05:12    Cardiac Studies   Lexiscan MPI 05/30/2023:   Findings are consistent with infarction with peri-infarct ischemia. The study is high risk due to degree of cardiomyopathy   No ST deviation was noted.   LV perfusion is abnormal. There is evidence of ischemia. There is evidence of infarction. Defect 1: There is a medium defect with moderate reduction in uptake present in the apical to mid anterior and anteroseptal location(s) that is partially reversible. There is abnormal wall motion in the defect area. Consistent with infarction and peri-infarct ischemia.   Left ventricular  function is abnormal. Nuclear stress EF: 33%. End diastolic cavity size is moderately enlarged. End systolic cavity size is moderately enlarged.   Suboptimal study due to GI uptake. __________   2D echo 12/16/2022: 1. Left ventricular ejection fraction, by estimation, is 30 to 35%. The  left ventricle has moderately decreased function. The left ventricle  demonstrates global hypokinesis with severe hypokinesis of the anterior  wall. The left ventricular internal  cavity size was mildly dilated. There is moderate asymmetric left  ventricular hypertrophy of the septal segment.   2. Right ventricle is not well visualized. Right ventricular systolic  function is grossly normal. The right ventricular size is normal.  Tricuspid regurgitation signal is inadequate for assessing PA pressure.   3. Left atrial size was mildly dilated.   4. The mitral valve is normal in structure. Trivial mitral valve  regurgitation. No evidence of mitral stenosis.   5. The aortic valve is normal in structure. Aortic valve regurgitation is  not visualized. Aortic valve sclerosis is present, with no evidence of  aortic valve stenosis.   6. The inferior vena cava is normal in size with greater than 50%  respiratory variability, suggesting right atrial pressure of 3 mmHg.   Patient Profile     88 y.o. 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 Clyde Lundborg.   Assessment & Plan    1. CAD status post CABG with unstable angina and abnormal Lexiscan MPI: -Currently, without symptoms of angina or cardiac decompensation  -High-sensitivity troponin negative x 3 -Patient last took apixaban on the evening of 7/25 -Plan for [facility] on 06/06/2023, sooner  if he becomes unstable or has recurrent angina -N.p.o. at midnight on 7/29 -Continue titrated dose of Imdur 60 mg daily -Continue carvedilol for antianginal effect -Rosuvastatin   2.  HFrEF secondary to ICM: -Appears grossly euvolemic with BMP improved from prior readings -PTA Entresto, carvedilol, Farxiga, Imdur/isosorbide, spironolactone, Verquvo  -Torsemide held 7/27, renal function pending this morning  -With underlying renal dysfunction, we will gently hydrate him beginning on the evening of 7/28 in preparation for cath on 7/29   3.  PAF: -Maintaining sinus rhythm with AV paced rhythm -PTA carvedilol -Heparin drip in place of apixaban in preparation for cardiac cath -Resume DOAC prior to discharge -CHA2DS2-VASc at least 6 (CHF, HTN, age x  2, DM, vascular disease)   4.  History of trifascicular block: -Status post CRT-P   5.  CKD stage III-IV: -Gentle hydration precath -Torsemide held 7/27 -Monitor renal function, pending today   6.  HTN: -Blood pressure well-controlled -Medical therapy as outlined above   7.  HLD: -LDL 109 this admission, target LDL < 55 -PTA rosuvastatin 40 mg, reports adherence to statin -Now on Zetia 10 mg -If LDL remains above goal in the outpatient setting, look to add PCSK9i  8. NSVT: -Coreg -LHC planned for tomorrow -BMP pending, replete potassium to goal 4.0 as indicated -Check magnesium with recommendation to replete to goal 2.0 as indicated   Informed Consent   Shared Decision Making/Informed Consent{  The risks [stroke (1 in 1000), death (1 in 1000), kidney failure [usually temporary] (1 in 500), bleeding (1 in 200), allergic reaction [possibly serious] (1 in 200)], benefits (diagnostic support and management of coronary artery disease) and alternatives of a cardiac catheterization were discussed in detail with Mr. Burruel and he is willing to proceed.      For questions or updates, please contact [facility] Please consult  [email or web address] for contact info under Cardiology/STEMI.    Signed, Eula Listen, PA-C [facility] Pager: [phone] 06/05/2023, 8:32 AM

## 2023-06-05 NOTE — Progress Notes (Signed)
ANTICOAGULATION CONSULT NOTE  Pharmacy Consult for heparin infusion Indication: ACS/STEMI  Allergies  Allergen Reactions   Angiotensin Receptor Blockers     hyperkalemia   Spironolactone     Hyperkalemia    Metformin Diarrhea    Patient Measurements: Weight: 99.7 kg (219 lb 12.8 oz) Heparin Dosing Weight: 89.8 kg  Vital Signs: Temp: 98.6 F (37 C) (07/28 0819) Temp Source: Oral (07/28 0819) BP: 125/58 (07/28 0819) Pulse Rate: 64 (07/28 0819)  Labs: Recent Labs    06/03/23 0347 06/03/23 0347 06/03/23 0544 06/03/23 1025 06/03/23 1155 06/03/23 1356 06/03/23 1516 06/03/23 2257 06/04/23 0537 06/04/23 1448 06/05/23 0022 06/05/23 0431 06/05/23 1013  HGB 13.5  --   --   --   --   --   --   --  12.2*  --   --  12.5*  --   HCT 43.6  --   --   --   --   --   --   --  39.0  --   --  38.9*  --   PLT 171  --   --   --   --   --   --   --  150  --   --  138*  --   APTT  --   --  28  --   --    < >  --  96* 120* 130*  --   --   --   LABPROT  --   --  14.2  --   --   --   --   --   --   --   --   --   --   INR  --   --  1.1  --   --   --   --   --   --   --   --   --   --   HEPARINUNFRC  --    < > 0.22*  --   --   --   --   --  0.66 0.74* 0.56  --  0.50  CREATININE 1.66*  --   --   --   --   --   --   --  1.84*  --   --   --  1.58*  TROPONINIHS 16  --  16 15 16   --  13  --   --   --   --   --   --    < > = values in this interval not displayed.    Estimated Creatinine Clearance: 39.1 mL/min (A) (by C-G formula based on SCr of 1.58 mg/dL (H)).   Medical History: Past Medical History:  Diagnosis Date   AV block, Mobitz 1    CAD (coronary artery disease)    a. 08/2019 VF arrest-->sev 3vd on cath-->CABGx5 (LIMA->LAD, VG->OM->LCX, VG->RPDA, VG->Diag); b. 08/2021 Cath: sev native multivessel dzs w/ 5/5 patent grafts. Nl filling pressures->Med rx.   CKD (chronic kidney disease), stage III (HCC)    Diabetes mellitus without complication (HCC)    HFrEF (heart failure with  reduced ejection fraction) (HCC)    a. 08/2020 Echo: EF 40-45%; b. 11/2020 Echo: EF 55%, Gr2 DD; c. 08/2021 Echo: EF 35-40%, glob HK w/ ant/antsept/apical HK; d. 10/2021 s/p CRT-P; e. 12/2021 Echo: EF 30-35%, glob HK, GrIII DD; f. 12/2022 Echo: EF 30-35%, glob HK, sev HK of ant wall, mod asymm LVH, nl RV fxn, mildly dil LA, triv MR,  AoV sclerosis.   Hyperlipidemia LDL goal <70    Hypertension    Intermittent Complete heart block (HCC)    a. 08/2021 noted on Zio; b. 10/2021 s/p Abbott Allure RF CRT-P (Ser # [id number]).   Ischemic cardiomyopathy    a. 08/2020 Echo: EF 40-45%; b. 11/2020 Echo: EF 55%; c. 08/2021 Echo: EF 35-40%; d. 10/2021 s/p CRT-P; e. 12/2021 Echo: EF 30-35%, glob HK, GrIII DD.   PAF (paroxysmal atrial fibrillation) (HCC)    a.Medical device check-->Eliquis (CHA2DS2VASc = 6)   Trifascicular block     Medications:  PTA Meds: Apixaban 2.5 mg BID, last dose unknown  Assessment: Pt is a 88 yo male presenting to ED c/o stabbing chest pain & SOB found w/ BNP of 1620.  Goal of Therapy:  Heparin level 0.3-0.7 units/ml aPTT 66-102 seconds Monitor platelets by anticoagulation protocol: Yes   Results 7/26@1356  aPTT=61 Subtherapeutic @ 1200 un/hr 7/26 2257 aPTT 96 Therapeutic x 1 7/27 0537 aPTT 120 supratherapeutic / HL 0.66 therapeutic x 1 7/27 1448  aPTT 130- supratherapeutic / HL 0.74 - slightly supratherapeutic 7/28 0022 HL 0.56 therapeutic x 1 7/28 1030 HL 0.50, therapeutic 2   Plan:  Heparin remains therapeutic Contiune heparin infusion rate at 1200 units/hr Recheck HL with daily labs CBC daily while on heparin  Sharen Hones, PharmD, BCPS Clinical Pharmacist   06/05/2023 10:51 AM

## 2023-06-05 NOTE — Progress Notes (Signed)
PROGRESS NOTE    [name]  WGN:[id number] DOB: [birth year] DOA: 06/03/2023 PCP: Idelia Salm, MD   Assessment & Plan:   Principal Problem:   Chest pain Active Problems:   CAD, multiple vessel   HTN (hypertension)   Chronic systolic CHF (congestive heart failure) (HCC)   HLD (hyperlipidemia)   Type II diabetes mellitus with renal manifestations (HCC)   Dyslipidemia   Paroxysmal atrial fibrillation (HCC)   CKD stage 3b, GFR 30-44 ml/min (HCC)   Obesity (BMI 30-39.9)  Assessment and Plan: Chest pain: w/ hx of CAD s/p of CABG and multiple stent placement. Troponins are neg x 3. Continue on IV heparin. Continue on aspirin, statin, coreg, aldactone, enteresto. R/LHC cardiac cath tomorrow as per cardio    HTN: continue on imdur, coreg, hydralazine, enteresto, aldactone     Chronic systolic CHF: echo on 12/2022 showed EF 30-35%.  BNP 1620, trace leg edema, no pulm edema chest x-ray, does not seem to have acute CHF exacerbation. Continue on aldactone, entresto, coreg & vericiguat. Holding torsemide   DM2: HbA1c 7.0, improved control. Continue on glargine, SSI w/ accuchecks   HLD: continue on statin    PAF: continue on coreg & IV heparin. Holding eliquis    CKDIIIb: Cr is labile. Avoid nephrotoxic meds    Obesity: BMI 33.4. Would benefit from weight loss        DVT prophylaxis: IV heparin  Code Status:  full  Family Communication:  Disposition Plan: likely d/c back home   Level of care: Progressive Status is: Inpatient Remains inpatient appropriate because: will go for cardiac cath tomorrow        Consultants:  Cardio   Procedures:   Antimicrobials:    Subjective: Pt c/o malaise   Objective: Vitals:   06/04/23 1607 06/04/23 2004 06/04/23 2357 06/05/23 0430  BP: 116/60 126/78 126/61 (!) 146/72  Pulse: 63 65 60 60  Resp: 20 18 16 18   Temp: 98.1 F (36.7 C) 97.8 F (36.6 C) 98.3 F (36.8 C) 97.7 F (36.5 C)  TempSrc:  Oral Oral Oral   SpO2: 99% 100% 98% 98%  Weight:        Intake/Output Summary (Last 24 hours) at 06/05/2023 0722 Last data filed at 06/04/2023 1919 Gross per 24 hour  Intake 1138.01 ml  Output 775 ml  Net 363.01 ml   Filed Weights   06/03/23 0343  Weight: 99.7 kg    Examination:  General exam: appears comfortable  Respiratory system: clear breath sounds b/l Cardiovascular system: S1/S2+. No rubs or clicks  Gastrointestinal system: Abd is soft, NT, obese & hypoactive bowel sounds  Central nervous system: alert & oriented. Moves all extremities  Psychiatry: judgement and insight appears at baseline. Flat mood and affect    Data Reviewed: I have personally reviewed following labs and imaging studies  CBC: Recent Labs  Lab 06/03/23 0347 06/04/23 0537 06/05/23 0431  WBC 5.4 3.8* 3.5*  NEUTROABS 3.6  --   --   HGB 13.5 12.2* 12.5*  HCT 43.6 39.0 38.9*  MCV 88.4 88.6 86.4  PLT 171 150 138*   Basic Metabolic Panel: Recent Labs  Lab 06/03/23 0347 06/03/23 0417 06/04/23 0537  NA 145  --  137  K 3.9  --  4.0  CL 105  --  104  CO2 24  --  25  GLUCOSE 137*  --  134*  BUN 25*  --  22  CREATININE 1.66*  --  1.84*  CALCIUM 9.0  --  8.0*  MG  --  2.1 2.1   GFR: Estimated Creatinine Clearance: 33.6 mL/min (A) (by C-G formula based on SCr of 1.84 mg/dL (H)). Liver Function Tests: Recent Labs  Lab 06/03/23 0347  AST 13*  ALT 10  ALKPHOS 64  BILITOT 0.5  PROT 6.9  ALBUMIN 3.3*   No results for input(s): "LIPASE", "AMYLASE" in the last 168 hours. No results for input(s): "AMMONIA" in the last 168 hours. Coagulation Profile: Recent Labs  Lab 06/03/23 0544  INR 1.1   Cardiac Enzymes: No results for input(s): "CKTOTAL", "CKMB", "CKMBINDEX", "TROPONINI" in the last 168 hours. BNP (last 3 results) No results for input(s): "PROBNP" in the last 8760 hours. HbA1C: Recent Labs    06/03/23 0544  HGBA1C 7.0*   CBG: Recent Labs  Lab 06/03/23 2107 06/04/23 0804 06/04/23 1241  06/04/23 1608 06/04/23 2004  GLUCAP 136* 134* 208* 106* 131*   Lipid Profile: Recent Labs    06/03/23 0544  CHOL 169  HDL 47  LDLCALC 109*  TRIG 66  CHOLHDL 3.6   Thyroid Function Tests: No results for input(s): "TSH", "T4TOTAL", "FREET4", "T3FREE", "THYROIDAB" in the last 72 hours. Anemia Panel: No results for input(s): "VITAMINB12", "FOLATE", "FERRITIN", "TIBC", "IRON", "RETICCTPCT" in the last 72 hours. Sepsis Labs: No results for input(s): "PROCALCITON", "LATICACIDVEN" in the last 168 hours.  Recent Results (from the past 240 hour(s))  SARS Coronavirus 2 by RT PCR (hospital order, performed in [facility] hospital lab) *cepheid single result test* Anterior Nasal Swab     Status: None   Collection Time: 06/03/23  4:17 AM   Specimen: Anterior Nasal Swab  Result Value Ref Range Status   SARS Coronavirus 2 by RT PCR NEGATIVE NEGATIVE Final    Comment: (NOTE) SARS-CoV-2 target nucleic acids are NOT DETECTED.  The SARS-CoV-2 RNA is generally detectable in upper and lower respiratory specimens during the acute phase of infection. The lowest concentration of SARS-CoV-2 viral copies this assay can detect is 250 copies / mL. A negative result does not preclude SARS-CoV-2 infection and should not be used as the sole basis for treatment or other patient management decisions.  A negative result may occur with improper specimen collection / handling, submission of specimen other than nasopharyngeal swab, presence of viral mutation(s) within the areas targeted by this assay, and inadequate number of viral copies (<250 copies / mL). A negative result must be combined with clinical observations, patient history, and epidemiological information.  Fact Sheet for Patients:   RoadLapTop.co.za  Fact Sheet for Healthcare Providers: http://kim-miller.com/  This test is not yet approved or  cleared by the [place] FDA and has been  authorized for detection and/or diagnosis of SARS-CoV-2 by FDA under an Emergency Use Authorization (EUA).  This EUA will remain in effect (meaning this test can be used) for the duration of the COVID-19 declaration under Section 564(b)(1) of the Act, 21 U.S.C. section 360bbb-3(b)(1), unless the authorization is terminated or revoked sooner.  Performed at [facility], [address] [place], [place] [address]          Radiology Studies: No results found.      Scheduled Meds:  acyclovir  800 mg Oral Daily   aspirin EC  81 mg Oral Daily   carvedilol  6.25 mg Oral BID WC   ezetimibe  10 mg Oral Daily   hydrALAZINE  25 mg Oral q12n4p   insulin aspart  0-5 Units Subcutaneous QHS   insulin aspart  0-9 Units  Subcutaneous TID WC   insulin glargine-yfgn  10 Units Subcutaneous Q2200   isosorbide mononitrate  60 mg Oral Daily   magnesium oxide  400 mg Oral Daily   pantoprazole  40 mg Oral BID   rosuvastatin  40 mg Oral Daily   sacubitril-valsartan  1 tablet Oral BID   spironolactone  12.5 mg Oral Daily   Vericiguat  10 mg Oral Daily   Continuous Infusions:  heparin 1,200 Units/hr (06/04/23 1733)     LOS: 1 day    Time spent: 35 mins     Charise Killian, MD [facility] Pager [phone]  If 7PM-7AM, please contact night-coverage [email or web address] 06/05/2023, 7:22 AM

## 2023-06-06 ENCOUNTER — Encounter
Admission: EM | Disposition: A | Discharge status: Home / Self Care | Payer: Self-pay | Source: Home / Self Care | Attending: Internal Medicine

## 2023-06-06 ENCOUNTER — Encounter: Payer: Self-pay | Admitting: Internal Medicine

## 2023-06-06 ENCOUNTER — Ambulatory Visit: Payer: Medicare HMO | Admitting: Cardiology

## 2023-06-06 DIAGNOSIS — N183 Chronic kidney disease, stage 3 unspecified: Secondary | ICD-10-CM | POA: Diagnosis not present

## 2023-06-06 DIAGNOSIS — R0789 Other chest pain: Secondary | ICD-10-CM | POA: Diagnosis not present

## 2023-06-06 DIAGNOSIS — I4891 Unspecified atrial fibrillation: Secondary | ICD-10-CM

## 2023-06-06 DIAGNOSIS — I25118 Atherosclerotic heart disease of native coronary artery with other forms of angina pectoris: Secondary | ICD-10-CM

## 2023-06-06 DIAGNOSIS — I5022 Chronic systolic (congestive) heart failure: Secondary | ICD-10-CM | POA: Diagnosis not present

## 2023-06-06 HISTORY — PX: RIGHT/LEFT HEART CATH AND CORONARY/GRAFT ANGIOGRAPHY: CATH118267

## 2023-06-06 LAB — GLUCOSE, CAPILLARY
Glucose-Capillary: 96 mg/dL (ref 70–99)
Glucose-Capillary: 98 mg/dL (ref 70–99)
Glucose-Capillary: 114 mg/dL — ABNORMAL HIGH (ref 70–99)

## 2023-06-06 SURGERY — RIGHT/LEFT HEART CATH AND CORONARY/GRAFT ANGIOGRAPHY
Anesthesia: Moderate Sedation

## 2023-06-06 MED ORDER — ISOSORBIDE MONONITRATE ER 60 MG PO TB24: 60.0000 mg | ORAL_TABLET | Freq: Every day | ORAL | 0 refills | Status: DC

## 2023-06-06 MED ORDER — SODIUM CHLORIDE 0.9% FLUSH: 3.0000 mL | INTRAVENOUS | Status: DC | PRN

## 2023-06-06 MED ORDER — MIDAZOLAM HCL 2 MG/2ML IJ SOLN
INTRAMUSCULAR | Status: DC | PRN
  Administered 2023-06-06: 1 mg via INTRAVENOUS

## 2023-06-06 MED ORDER — ONDANSETRON HCL 4 MG/2ML IJ SOLN: 4.0000 mg | Freq: Four times a day (QID) | INTRAMUSCULAR | Status: DC | PRN

## 2023-06-06 MED ORDER — SODIUM CHLORIDE 0.9% FLUSH: 3.0000 mL | Freq: Two times a day (BID) | INTRAVENOUS | Status: DC

## 2023-06-06 MED ORDER — SODIUM CHLORIDE 0.9 % IV SOLN: 250.0000 mL | INTRAVENOUS | Status: DC | PRN

## 2023-06-06 MED ORDER — APIXABAN 2.5 MG PO TABS: 2.5000 mg | ORAL_TABLET | Freq: Two times a day (BID) | ORAL | Status: DC

## 2023-06-06 MED ORDER — FENTANYL CITRATE (PF) 100 MCG/2ML IJ SOLN
INTRAMUSCULAR | Status: AC
  Filled 2023-06-06: qty 2

## 2023-06-06 MED ORDER — VERAPAMIL HCL 2.5 MG/ML IV SOLN
INTRAVENOUS | Status: AC
  Filled 2023-06-06: qty 2

## 2023-06-06 MED ORDER — FENTANYL CITRATE (PF) 100 MCG/2ML IJ SOLN
INTRAMUSCULAR | Status: DC | PRN
  Administered 2023-06-06: 25 ug via INTRAVENOUS

## 2023-06-06 MED ORDER — MIDAZOLAM HCL 2 MG/2ML IJ SOLN
INTRAMUSCULAR | Status: AC
  Filled 2023-06-06: qty 2

## 2023-06-06 MED ORDER — IOHEXOL 300 MG/ML  SOLN
INTRAMUSCULAR | Status: DC | PRN
  Administered 2023-06-06: 60 mL

## 2023-06-06 MED ORDER — EZETIMIBE 10 MG PO TABS: 10.0000 mg | ORAL_TABLET | Freq: Every day | ORAL | 0 refills | Status: DC

## 2023-06-06 MED ORDER — HEPARIN (PORCINE) IN NACL 1000-0.9 UT/500ML-% IV SOLN
INTRAVENOUS | Status: DC | PRN
  Administered 2023-06-06 (×2): 500 mL

## 2023-06-06 MED ORDER — ASPIRIN 81 MG PO TBEC: 81.0000 mg | DELAYED_RELEASE_TABLET | Freq: Every day | ORAL | 0 refills | Status: AC

## 2023-06-06 MED ORDER — TORSEMIDE 20 MG PO TABS: 40.0000 mg | ORAL_TABLET | Freq: Every day | ORAL | Status: DC

## 2023-06-06 MED ORDER — HEPARIN (PORCINE) IN NACL 1000-0.9 UT/500ML-% IV SOLN
INTRAVENOUS | Status: AC
  Filled 2023-06-06: qty 1000

## 2023-06-06 MED ORDER — LIDOCAINE HCL (PF) 1 % IJ SOLN
INTRAMUSCULAR | Status: DC | PRN
  Administered 2023-06-06 (×2): 2 mL

## 2023-06-06 MED ORDER — HEPARIN SODIUM (PORCINE) 1000 UNIT/ML IJ SOLN
INTRAMUSCULAR | Status: AC
  Filled 2023-06-06: qty 10

## 2023-06-06 MED ORDER — VERAPAMIL HCL 2.5 MG/ML IV SOLN
INTRAVENOUS | Status: DC | PRN
  Administered 2023-06-06: 2.5 mg via INTRAVENOUS

## 2023-06-06 MED ORDER — HEPARIN SODIUM (PORCINE) 1000 UNIT/ML IJ SOLN
INTRAMUSCULAR | Status: DC | PRN
  Administered 2023-06-06: 5000 [IU] via INTRAVENOUS

## 2023-06-06 SURGICAL SUPPLY — 17 items
CATH BALLN WEDGE 5F 110CM (CATHETERS) IMPLANT
CATH INFINITI 5 FR IM (CATHETERS) IMPLANT
CATH INFINITI 5 FR MPA2 (CATHETERS) IMPLANT
CATH INFINITI 5FR AL1 (CATHETERS) IMPLANT
CATH INFINITI 5FR MULTPACK ANG (CATHETERS) IMPLANT
DEVICE RAD TR BAND REGULAR (VASCULAR PRODUCTS) IMPLANT
DRAPE BRACHIAL (DRAPES) IMPLANT
GLIDESHEATH SLEND SS 6F .021 (SHEATH) IMPLANT
GUIDEWIRE .025 260CM (WIRE) IMPLANT
GUIDEWIRE INQWIRE 1.5J.035X260 (WIRE) IMPLANT
INQWIRE 1.5J .035X260CM (WIRE) ×1
KIT SYRINGE INJ CVI SPIKEX1 (MISCELLANEOUS) IMPLANT
PACK CARDIAC CATH (CUSTOM PROCEDURE TRAY) ×1 IMPLANT
PROTECTION STATION PRESSURIZED (MISCELLANEOUS) ×1
SET ATX-X65L (MISCELLANEOUS) IMPLANT
SHEATH GLIDE SLENDER 4/5FR (SHEATH) IMPLANT
STATION PROTECTION PRESSURIZED (MISCELLANEOUS) IMPLANT

## 2023-06-06 NOTE — Progress Notes (Signed)
ANTICOAGULATION CONSULT NOTE  Pharmacy Consult for heparin infusion Indication: ACS/STEMI  Allergies  Allergen Reactions   Angiotensin Receptor Blockers     hyperkalemia   Spironolactone     Hyperkalemia    Metformin Diarrhea    Patient Measurements: Weight: 99.7 kg (219 lb 12.8 oz) Heparin Dosing Weight: 89.8 kg  Vital Signs: Temp: 97.9 F (36.6 C) (07/29 0413) BP: 130/65 (07/29 0413) Pulse Rate: 59 (07/29 0413)  Labs: Recent Labs    06/03/23 0544 06/03/23 0544 06/03/23 1025 06/03/23 1155 06/03/23 1356 06/03/23 1516 06/03/23 2257 06/04/23 0537 06/04/23 1448 06/05/23 0022 06/05/23 0431 06/05/23 1013 06/06/23 0420  HGB  --    < >  --   --   --   --   --  12.2*  --   --  12.5*  --  12.6*  HCT  --   --   --   --   --   --   --  39.0  --   --  38.9*  --  40.4  PLT  --   --   --   --   --   --   --  150  --   --  138*  --  147*  APTT 28  --   --   --    < >  --  96* 120* 130*  --   --   --   --   LABPROT 14.2  --   --   --   --   --   --   --   --   --   --   --   --   INR 1.1  --   --   --   --   --   --   --   --   --   --   --   --   HEPARINUNFRC 0.22*  --   --   --   --   --   --  0.66 0.74* 0.56  --  0.50 0.36  CREATININE  --   --   --   --   --   --   --  1.84*  --   --   --  1.58*  --   TROPONINIHS 16  --  15 16  --  13  --   --   --   --   --   --   --    < > = values in this interval not displayed.    Estimated Creatinine Clearance: 39.1 mL/min (A) (by C-G formula based on SCr of 1.58 mg/dL (H)).   Medical History: Past Medical History:  Diagnosis Date   AV block, [place] 1    CAD (coronary artery disease)    a. 08/2019 VF arrest-->sev 3vd on cath-->CABGx5 (LIMA->LAD, VG->OM->LCX, VG->RPDA, VG->Diag); b. 08/2021 Cath: sev native multivessel dzs w/ 5/5 patent grafts. Nl filling pressures->Med rx.   CKD (chronic kidney disease), stage III (HCC)    Diabetes mellitus without complication (HCC)    HFrEF (heart failure with reduced ejection fraction) (HCC)     a. 08/2020 Echo: EF 40-45%; b. 11/2020 Echo: EF 55%, Gr2 DD; c. 08/2021 Echo: EF 35-40%, glob HK w/ ant/antsept/apical HK; d. 10/2021 s/p CRT-P; e. 12/2021 Echo: EF 30-35%, glob HK, GrIII DD; f. 12/2022 Echo: EF 30-35%, glob HK, sev HK of ant wall, mod asymm LVH, nl RV fxn, mildly dil LA, triv MR, AoV sclerosis.  Hyperlipidemia LDL goal <70    Hypertension    Intermittent Complete heart block (HCC)    a. 08/2021 noted on Zio; b. 10/2021 s/p Abbott Allure RF CRT-P (Ser # [id number]).   Ischemic cardiomyopathy    a. 08/2020 Echo: EF 40-45%; b. 11/2020 Echo: EF 55%; c. 08/2021 Echo: EF 35-40%; d. 10/2021 s/p CRT-P; e. 12/2021 Echo: EF 30-35%, glob HK, GrIII DD.   PAF (paroxysmal atrial fibrillation) (HCC)    a.Medical device check-->Eliquis (CHA2DS2VASc = 6)   Trifascicular block     Medications:  PTA Meds: Apixaban 2.5 mg BID, last dose unknown  Assessment: Pt is a 88 yo male presenting to ED c/o stabbing chest pain & SOB found w/ BNP of 1620.  Goal of Therapy:  Heparin level 0.3-0.7 units/ml aPTT 66-102 seconds Monitor platelets by anticoagulation protocol: Yes   Results 7/26@1356  aPTT=61 Subtherapeutic @ 1200 un/hr 7/26 2257 aPTT 96 Therapeutic x 1 7/27 0537 aPTT 120 supratherapeutic / HL 0.66 therapeutic x 1 7/27 1448  aPTT 130- supratherapeutic / HL 0.74 - slightly supratherapeutic 7/28 0022 HL 0.56 therapeutic x 1 7/28 1030 HL 0.50, therapeutic 2 7/29 0420 HL 0.36, therapeutic x 3  Plan:  Heparin remains therapeutic Contiune heparin infusion rate at 1200 units/hr Recheck HL with daily labs CBC daily while on heparin  Otelia Sergeant, PharmD, MBA 06/06/2023 5:00 AM

## 2023-06-06 NOTE — Discharge Summary (Signed)
Physician Discharge Summary  [name] Brakeman MHD:[id number] DOB: 1935-02-03 DOA: 06/03/2023  PCP: Idelia Salm, MD  Admit date: 06/03/2023 Discharge date: 06/06/2023  Admitted From: home  Disposition:  home   Recommendations for Outpatient Follow-up:  Follow up with PCP in 1-2 weeks F/u w/ cardio, Dr. Kirke Corin, in 2-3 weeks   Home Health: no  Equipment/Devices:  Discharge Condition: stable  CODE STATUS: full  Diet recommendation: Heart Healthy / Carb Modified   Brief/Interim Summary: HPI was taken from Dr. Clyde Lundborg:  [name] is a 88 y.o. male with medical history significant of sCHF with EF 35-40, HTN, HLD, DM, GERD, CKD stage IIIa, CAD, s/p of stent, CABG, cardiac arrest, complete heart block, s/p of pacemaker, who presents with chest pain, shortness of breath.    Patient states that his chest pain started last night at about 8 PM, associated with shortness of breath.  He has cough with foamy mucus production.  No fever or chills.  Patient has nausea and vomited at home, which has resolved.  Currently no nausea, vomiting, diarrhea or abdominal pain.  Denies symptoms of UTI.  Denies any fall or head injury.  No rectal bleeding or dark stool.  Patient was given aspirin and nitroglycerin treatment by EMS without improvement of his chest pain.  He still has persistent chest pain, which is located in substernal area, dull, 8 out of 10 in severity, nonradiating.  Patient had an abnormal Myoview on 05/30/2023 as below:   Myoview on 05/30/23:   Findings are consistent with infarction with peri-infarct ischemia. The study is high risk due to degree of cardiomyopathy   No ST deviation was noted.   LV perfusion is abnormal. There is evidence of ischemia. There is evidence of infarction. Defect 1: There is a medium defect with moderate reduction in uptake present in the apical to mid anterior and anteroseptal location(s) that is partially reversible. There is abnormal wall motion in the defect area.  Consistent with infarction and peri-infarct ischemia.   Left ventricular function is abnormal. Nuclear stress EF: 33%. End diastolic cavity size is moderately enlarged. End systolic cavity size is moderately enlarged.   Suboptimal study due to GI uptake.     Data reviewed independently and ED Course: pt was found to have tgrop  16 --> 15, BNP 1620, WBC 5.4, INR 1.1, PTT 28, negative COVID PCR, renal function close to baseline, temperature normal, blood pressure 171/72, heart rate 59, RR 21, oxygen saturation 98% on room air.  Chest x-ray showed mild cardiomegaly without infiltration.  Patient is placed in PCU for observation, Dr. Okey Dupre of cardiology is consulted.     EKG: I have personally reviewed.  Sinus rhythm, QTc 503, left bundle blockade, T wave inversion in lateral leads, occasional PVC  Discharge Diagnoses:  Principal Problem:   Chest pain Active Problems:   CAD, multiple vessel   HTN (hypertension)   Chronic systolic CHF (congestive heart failure) (HCC)   HLD (hyperlipidemia)   Type II diabetes mellitus with renal manifestations (HCC)   Dyslipidemia   Paroxysmal atrial fibrillation (HCC)   CKD stage 3b, GFR 30-44 ml/min (HCC)   Obesity (BMI 30-39.9)  Chest pain: w/ hx of CAD s/p of CABG and multiple stent placement. Troponins are neg x 3. D/c IV heparin. Continue on aspirin, statin, coreg, aldactone, enteresto. S/p cardiac cath which showed patent grafts and no change from prior cardiac cath as per cardio    HTN: continue on imdur, coreg, hydralazine, enteresto, aldactone & restart  torsemide   Chronic systolic CHF: echo on 12/2022 showed EF 30-35%.  BNP 1620, trace leg edema, no pulm edema chest x-ray, does not seem to have acute CHF exacerbation. Continue on aldactone, entresto, coreg & vericiguat. Restart torsemide   DM2: HbA1c 7.0, improved control. Continue on glargine, SSI w/ accuchecks   HLD: continue on statin and started on zetia as per cardio    PAF: continue on coreg  & restart eliquis. D/c IV heparin    CKDIIIb: Cr is labile. Avoid nephrotoxic meds    Obesity: BMI 33.4. Would benefit from weight loss   Discharge Instructions  Discharge Instructions     AMB referral to Phase II Cardiac Rehabilitation   Complete by: As directed    Diagnosis: Stable Angina   After initial evaluation and assessments completed: Virtual Based Care may be provided alone or in conjunction with Phase 2 Cardiac Rehab based on patient barriers.: Yes   Intensive Cardiac Rehabilitation (ICR) [facility] location only OR Traditional Cardiac Rehabilitation (TCR) *If criteria for ICR are not met will enroll in TCR [facility] only): Yes   Diet - low sodium heart healthy   Complete by: As directed    Diet Carb Modified   Complete by: As directed    Discharge instructions   Complete by: As directed    F/u w/ PCP in 1-2 weeks. F/u w/ cardio, Dr. Kirke Corin, in 2-3 weeks   Increase activity slowly   Complete by: As directed       Allergies as of 06/06/2023       Reactions   Angiotensin Receptor Blockers    hyperkalemia   Spironolactone    Hyperkalemia   Metformin Diarrhea        Medication List     TAKE these medications    acetaminophen 500 MG tablet Commonly known as: TYLENOL Take 1,000 mg by mouth every 6 (six) hours as needed for mild pain, fever or headache.   acyclovir 800 MG tablet Commonly known as: ZOVIRAX Take 1 tablet by mouth daily.   albuterol 108 (90 Base) MCG/ACT inhaler Commonly known as: VENTOLIN HFA Inhale 2 puffs into the lungs every 6 (six) hours as needed for wheezing or shortness of breath.   apixaban 2.5 MG Tabs tablet Commonly known as: Eliquis Take 1 tablet (2.5 mg total) by mouth 2 (two) times daily.   aspirin EC 81 MG tablet Take 1 tablet (81 mg total) by mouth daily. Swallow whole. Start taking on: June 07, 2023   carvedilol 6.25 MG tablet Commonly known as: COREG Take 1 tablet (6.25 mg total) by mouth 2 (two) times daily with a meal.    diclofenac Sodium 1 % Gel Commonly known as: VOLTAREN Apply 2 g topically 4 (four) times daily.   ezetimibe 10 MG tablet Commonly known as: ZETIA Take 1 tablet (10 mg total) by mouth daily.   Farxiga 10 MG Tabs tablet Generic drug: dapagliflozin propanediol Take 10 mg by mouth daily.   fluticasone 110 MCG/ACT inhaler Commonly known as: FLOVENT HFA Inhale 1 puff into the lungs daily.   hydrALAZINE 25 MG tablet Commonly known as: APRESOLINE Take 25 mg by mouth 2 times daily at 12 noon and 4 pm.   insulin degludec 100 UNIT/ML FlexTouch Pen Commonly known as: TRESIBA Inject 0.15 mLs into the skin at bedtime.   isosorbide mononitrate 60 MG 24 hr tablet Commonly known as: IMDUR Take 1 tablet (60 mg total) by mouth daily. What changed:  medication strength how much to  take   magnesium oxide 400 MG tablet Commonly known as: MAG-OX Take 1 tablet (400 mg total) by mouth daily.   metolazone 2.5 MG tablet Commonly known as: ZAROXOLYN TAKE 1 TABLET (2.5) MG 1 HOUR BEFORE LASIX WEEKLY ON TUESDAYS   pantoprazole 40 MG tablet Commonly known as: PROTONIX Take 40 mg by mouth 2 (two) times daily.   polyethylene glycol 17 g packet Commonly known as: MIRALAX / GLYCOLAX Take 17 g by mouth daily as needed for moderate constipation.   Potassium Chloride ER 20 MEQ Tbcr Take 1 tablet (20 mEq total) by mouth daily.   rosuvastatin 40 MG tablet Commonly known as: CRESTOR Take 1 tablet (40 mg total) by mouth daily.   sacubitril-valsartan 97-103 MG Commonly known as: ENTRESTO Take 1 tablet by mouth 2 (two) times daily.   spironolactone 25 MG tablet Commonly known as: ALDACTONE Take 0.5 tablets (12.5 mg total) by mouth daily.   torsemide 20 MG tablet Commonly known as: DEMADEX Take 2 tablets (40 mg total) by mouth daily.   Verquvo 10 MG Tabs Generic drug: Vericiguat Take 1 tablet (10 mg total) by mouth daily.        Allergies  Allergen Reactions   Angiotensin Receptor  Blockers     hyperkalemia   Spironolactone     Hyperkalemia    Metformin Diarrhea    Consultations: Cardio    Procedures/Studies: CARDIAC CATHETERIZATION  Result Date: 06/06/2023   Ost LAD to Prox LAD lesion is 100% stenosed.   Mid LAD lesion is 10% stenosed.   Dist LAD lesion is 5% stenosed.   Mid Cx to Dist Cx lesion is 85% stenosed.   Prox RCA lesion is 50% stenosed.   Mid RCA lesion is 95% stenosed.   Origin lesion is 40% stenosed.   1st Diag-1 lesion is 90% stenosed.   1st Diag-2 lesion is 10% stenosed.   2nd Mrg lesion is 60% stenosed.   RPDA lesion is 50% stenosed.   Non-stenotic Prox Cx to Mid Cx lesion was previously treated.   SVG and is normal in caliber.   SVG and is normal in caliber.   LIMA and is normal in caliber.   SVG and is normal in caliber.   The graft exhibits no disease.   The graft exhibits no disease.   The graft exhibits no disease. 1.  Significant underlying three-vessel coronary artery disease with patent grafts including LIMA to LAD, SVG to diagonal, sequential SVG to left circumflex and SVG to right PDA.  Stable moderate stenosis in proximal SVG to right PDA. 2.  Left ventricular angiography was not performed due to chronic kidney disease. 3.  Mildly to moderately elevated left ventricular end-diastolic pressure at 22 mmHg.  Right heart catheterization was attempted but aborted due to inability to advance the Swan-Ganz catheter via the left upper extremity veins due to presence of pacemaker leads. Recommendations: Continue medical therapy for coronary artery disease and chronic systolic heart failure. I resumed torsemide 40 mg once daily. Eliquis will be resumed tonight.  DG Chest Portable 1 View  Result Date: 06/03/2023 CLINICAL DATA:  88 year old male with history of shortness of breath and stabbing chest pain. EXAM: PORTABLE CHEST 1 VIEW COMPARISON:  Chest x-ray 05/22/2023. FINDINGS: Lung volumes are normal. No consolidative airspace disease. No pleural effusions.  No pneumothorax. No evidence of pulmonary edema. Heart size is mildly enlarged. Upper mediastinal contours are within normal limits. Atherosclerosis in the thoracic aorta. Status post median sternotomy for CABG and left  atrial appendage ligation (a ligation clip is noted). Left-sided pacemaker device in place with lead tips projecting over the expected location of the right atrium and right ventricle. 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 IMPRESSION: 1. No radiographic evidence of acute cardiopulmonary disease. 2. Mild cardiomegaly. 3. Aortic atherosclerosis. Electronically Signed   By: Trudie Reed M.D.   On: 06/03/2023 05:12   NM Myocar Multi W/Spect Izetta Dakin Motion / EF  Result Date: 05/30/2023   Findings are consistent with infarction with peri-infarct ischemia. The study is high risk due to degree of cardiomyopathy   No ST deviation was noted.   LV perfusion is abnormal. There is evidence of ischemia. There is evidence of infarction. Defect 1: There is a medium defect with moderate reduction in uptake present in the apical to mid anterior and anteroseptal location(s) that is partially reversible. There is abnormal wall motion in the defect area. Consistent with infarction and peri-infarct ischemia.   Left ventricular function is abnormal. Nuclear stress EF: 33%. End diastolic cavity size is moderately enlarged. End systolic cavity size is moderately enlarged.   Suboptimal study due to GI uptake.   DG Chest Portable 1 View  Result Date: 05/22/2023 CLINICAL DATA:  88 year old male with history of chest pain. EXAM: PORTABLE CHEST 1 VIEW COMPARISON:  Numerous priors, most recently chest x-ray 03/09/2023. FINDINGS: Lung volumes are normal. No consolidative airspace disease. No pleural effusions. No pneumothorax. No evidence of pulmonary edema. Heart size is mildly enlarged. Upper mediastinal  contours are within normal limits. Status post median sternotomy for CABG and left atrial appendage ligation (a ligation clip is noted). Left-sided pacemaker device in place with lead tips projecting over the expected location of the right atrium, right ventricle and likely within a coronary vein. 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 IMPRESSION: 1. No radiographic evidence of acute cardiopulmonary disease. 2. Mild cardiomegaly. 3. Postoperative changes and support apparatus, as above. Electronically Signed   By: Trudie Reed M.D.   On: 05/22/2023 06:34   CUP PACEART INCLINIC DEVICE CHECK  Result Date: 05/11/2023 CRT-P device check in clinic. Normal device function. Atrial threshold slightly elevated. V-Threshold, sensing, impedance consistent with previous measurements. Histograms appropriate for patient and level of activity. Parox AFib/flutter noted, brief. No  ventricular high rate episodes. Patient bi-ventricularly pacing 98% of the time. Device programmed with appropriate safety margins. Device heart failure diagnostics are within normal limits and stable over time. Estimated longevity _5 years__. Patient enrolled in remote follow-up. Plan to check device remotely in 3 months and every 6 months in office.  (Echo, Carotid, EGD, Colonoscopy, ERCP)    Subjective: Pt c/o fatigue    Discharge Exam: Vitals:   06/06/23 1337 06/06/23 1409  BP: (!) 150/72   Pulse: 69   Resp: 18 18  Temp: 97.8 F (36.6 C)   SpO2: 98%    Vitals:   06/06/23 1300 06/06/23 1305 06/06/23 1337 06/06/23 1409  BP:  (!) 152/78 (!) 150/72   Pulse: 69  69   Resp: 16  18 18   Temp:   97.8 F (36.6 C)   TempSrc:   Oral   SpO2: 97%  98%   Weight:      Height:        General: Pt is alert,  awake, not in acute distress Cardiovascular: S1/S2 +, no  rubs, no gallops Respiratory: CTA bilaterally, no wheezing, no rhonchi Abdominal: Soft, NT, obese, bowel sounds + Extremities: no cyanosis    The results of significant diagnostics from this hospitalization (including imaging, microbiology, ancillary and laboratory) are listed below for reference.     Microbiology: Recent Results (from the past 240 hour(s))  SARS Coronavirus 2 by RT PCR (hospital order, performed in [facility] hospital lab) *cepheid single result test* Anterior Nasal Swab     Status: None   Collection Time: 06/03/23  4:17 AM   Specimen: Anterior Nasal Swab  Result Value Ref Range Status   SARS Coronavirus 2 by RT PCR NEGATIVE NEGATIVE Final    Comment: (NOTE) SARS-CoV-2 target nucleic acids are NOT DETECTED.  The SARS-CoV-2 RNA is generally detectable in upper and lower respiratory specimens during the acute phase of infection. The lowest concentration of SARS-CoV-2 viral copies this assay can detect is 250 copies / mL. A negative result does not preclude SARS-CoV-2 infection and should not be used as the sole basis for treatment or other patient management decisions.  A negative result may occur with improper specimen collection / handling, submission of specimen other than nasopharyngeal swab, presence of viral mutation(s) within the areas targeted by this assay, and inadequate number of viral copies (<250 copies / mL). A negative result must be combined with clinical observations, patient history, and epidemiological information.  Fact Sheet for Patients:   RoadLapTop.co.za  Fact Sheet for Healthcare Providers: http://kim-miller.com/  This test is not yet approved or  cleared by the [place] FDA and has been authorized for detection and/or diagnosis of SARS-CoV-2 by FDA under an Emergency Use Authorization (EUA).  This EUA will remain in effect (meaning this test can be used) for the duration of  the COVID-19 declaration under Section 564(b)(1) of the Act, 21 U.S.C. section 360bbb-3(b)(1), unless the authorization is terminated or revoked sooner.  Performed at [facility], [address] [place], [place] [address]      Labs: BNP (last 3 results) Recent Labs    03/09/23 1252 04/18/23 1103 06/03/23 0417  BNP 3,080.1* 1,820.1* 1,620.5*   Basic Metabolic Panel: Recent Labs  Lab 06/03/23 0347 06/03/23 0417 06/04/23 0537 06/05/23 1013 06/06/23 0420  NA 145  --  137 138 140  K 3.9  --  4.0 3.5 3.6  CL 105  --  104 106 110  CO2 24  --  25 24 24   GLUCOSE 137*  --  134* 135* 167*  BUN 25*  --  22 23 21   CREATININE 1.66*  --  1.84* 1.58* 1.54*  CALCIUM 9.0  --  8.0* 8.1* 8.0*  MG  --  2.1 2.1 2.2  --    Liver Function Tests: Recent Labs  Lab 06/03/23 0347  AST 13*  ALT 10  ALKPHOS 64  BILITOT 0.5  PROT 6.9  ALBUMIN 3.3*   No results for input(s): "LIPASE", "AMYLASE" in the last 168 hours. No results for input(s): "AMMONIA" in the last 168 hours. CBC: Recent Labs  Lab 06/03/23 0347 06/04/23 0537 06/05/23 0431 06/06/23 0420  WBC 5.4 3.8* 3.5* 4.7  NEUTROABS 3.6  --   --   --   HGB 13.5 12.2* 12.5* 12.6*  HCT 43.6 39.0 38.9* 40.4  MCV 88.4 88.6 86.4 88.4  PLT 171 150 138* 147*   Cardiac Enzymes: No results for input(s): "CKTOTAL", "CKMB", "CKMBINDEX", "TROPONINI" in the last 168 hours.  BNP: Invalid input(s): "POCBNP" CBG: Recent Labs  Lab 06/05/23 1140 06/05/23 1537 06/05/23 2049 06/06/23 0841 06/06/23 1230  GLUCAP 200* 89 149* 98 114*   D-Dimer No results for input(s): "DDIMER" in the last 72 hours. Hgb A1c No results for input(s): "HGBA1C" in the last 72 hours. Lipid Profile No results for input(s): "CHOL", "HDL", "LDLCALC", "TRIG", "CHOLHDL", "LDLDIRECT" in the last 72 hours. Thyroid function studies No results for input(s): "TSH", "T4TOTAL", "T3FREE", "THYROIDAB" in the last 72 hours.  Invalid input(s): "FREET3" Anemia  work up No results for input(s): "VITAMINB12", "FOLATE", "FERRITIN", "TIBC", "IRON", "RETICCTPCT" in the last 72 hours. Urinalysis    Component Value Date/Time   COLORURINE YELLOW 09/01/2020 2035   APPEARANCEUR HAZY (A) 09/01/2020 2035   LABSPEC 1.010 09/01/2020 2035   PHURINE 5.0 09/01/2020 2035   GLUCOSEU NEGATIVE 09/01/2020 2035   HGBUR MODERATE (A) 09/01/2020 2035   BILIRUBINUR NEGATIVE 09/01/2020 2035   KETONESUR NEGATIVE 09/01/2020 2035   PROTEINUR NEGATIVE 09/01/2020 2035   NITRITE NEGATIVE 09/01/2020 2035   LEUKOCYTESUR TRACE (A) 09/01/2020 2035   Sepsis Labs Recent Labs  Lab 06/03/23 0347 06/04/23 0537 06/05/23 0431 06/06/23 0420  WBC 5.4 3.8* 3.5* 4.7   Microbiology Recent Results (from the past 240 hour(s))  SARS Coronavirus 2 by RT PCR (hospital order, performed in [facility] hospital lab) *cepheid single result test* Anterior Nasal Swab     Status: None   Collection Time: 06/03/23  4:17 AM   Specimen: Anterior Nasal Swab  Result Value Ref Range Status   SARS Coronavirus 2 by RT PCR NEGATIVE NEGATIVE Final    Comment: (NOTE) SARS-CoV-2 target nucleic acids are NOT DETECTED.  The SARS-CoV-2 RNA is generally detectable in upper and lower respiratory specimens during the acute phase of infection. The lowest concentration of SARS-CoV-2 viral copies this assay can detect is 250 copies / mL. A negative result does not preclude SARS-CoV-2 infection and should not be used as the sole basis for treatment or other patient management decisions.  A negative result may occur with improper specimen collection / handling, submission of specimen other than nasopharyngeal swab, presence of viral mutation(s) within the areas targeted by this assay, and inadequate number of viral copies (<250 copies / mL). A negative result must be combined with clinical observations, patient history, and epidemiological information.  Fact Sheet for Patients:    RoadLapTop.co.za  Fact Sheet for Healthcare Providers: http://kim-miller.com/  This test is not yet approved or  cleared by the [place] FDA and has been authorized for detection and/or diagnosis of SARS-CoV-2 by FDA under an Emergency Use Authorization (EUA).  This EUA will remain in effect (meaning this test can be used) for the duration of the COVID-19 declaration under Section 564(b)(1) of the Act, 21 U.S.C. section 360bbb-3(b)(1), unless the authorization is terminated or revoked sooner.  Performed at [facility], [address] [place], [place] [address]      Time coordinating discharge: Over 30 minutes  SIGNED:   Charise Killian, MD  [facility] 06/06/2023, 2:22 PM Pager   If 7PM-7AM, please contact night-coverage [email or web address]

## 2023-06-06 NOTE — Interval H&P Note (Signed)
Cath Lab Visit (complete for each Cath Lab visit)  Clinical Evaluation Leading to the Procedure:   ACS: No.  Non-ACS:    Anginal Classification: CCS III  Anti-ischemic medical therapy: Maximal Therapy (2 or more classes of medications)  Non-Invasive Test Results: High-risk stress test findings: cardiac mortality >3%/year  Prior CABG: Previous CABG      History and Physical Interval Note:  06/06/2023 10:00 AM  [name]  has presented today for surgery, with the diagnosis of Coronary artery disease with unstable angina and abnormal nuclear stress test.  The various methods of treatment have been discussed with the patient and family. After consideration of risks, benefits and other options for treatment, the patient has consented to  Procedure(s): RIGHT/LEFT HEART CATH AND CORONARY/GRAFT ANGIOGRAPHY (N/A) as a surgical intervention.  The patient's history has been reviewed, patient examined, no change in status, stable for surgery.  I have reviewed the patient's chart and labs.  Questions were answered to the patient's satisfaction.     Lorine Bears

## 2023-06-06 NOTE — Progress Notes (Signed)
Progress Note  Patient Name: [name] Date of Encounter: 06/06/2023  Primary Cardiologist: Kirke Corin Primary Electrophysiologist:  Lalla Brothers Advanced Heart Failure: Shirlee Latch  Subjective   No further chest pain.  Cardiac catheterization done this morning showed patent grafts with no significant change from most recent cardiac catheterization.  LVEDP was 22 mmHg.  Inpatient Medications    Scheduled Meds:  [MAR Hold] acyclovir  800 mg Oral Daily   apixaban  2.5 mg Oral BID   [MAR Hold] aspirin EC  81 mg Oral Daily   [MAR Hold] carvedilol  6.25 mg Oral BID WC   [MAR Hold] ezetimibe  10 mg Oral Daily   [MAR Hold] hydrALAZINE  25 mg Oral q12n4p   [MAR Hold] insulin aspart  0-5 Units Subcutaneous QHS   [MAR Hold] insulin aspart  0-9 Units Subcutaneous TID WC   [MAR Hold] insulin glargine-yfgn  10 Units Subcutaneous Q2200   [MAR Hold] isosorbide mononitrate  60 mg Oral Daily   [MAR Hold] magnesium oxide  400 mg Oral Daily   [MAR Hold] pantoprazole  40 mg Oral BID   [MAR Hold] rosuvastatin  40 mg Oral Daily   [MAR Hold] sacubitril-valsartan  1 tablet Oral BID   sodium chloride flush  3 mL Intravenous Q12H   [MAR Hold] spironolactone  12.5 mg Oral Daily   torsemide  40 mg Oral Daily   [MAR Hold] Vericiguat  10 mg Oral Daily   Continuous Infusions:  sodium chloride     PRN Meds: sodium chloride, [MAR Hold] acetaminophen, [MAR Hold] albuterol, [MAR Hold] dextromethorphan-guaiFENesin, [MAR Hold] diphenhydrAMINE, [MAR Hold] hydrALAZINE, iohexol, [MAR Hold]  morphine injection, [MAR Hold] nitroGLYCERIN, ondansetron (ZOFRAN) IV, [MAR Hold] oxyCODONE-acetaminophen, [MAR Hold] polyethylene glycol, sodium chloride flush   Vital Signs    Vitals:   06/06/23 1130 06/06/23 1145 06/06/23 1200 06/06/23 1215  BP: (!) 155/93 (!) 161/87 (!) 163/85 (!) 156/79  Pulse: 68 70 65 66  Resp: 11 11 15  (!) 24  Temp:      TempSrc:      SpO2: 97% 97% 97% 96%  Weight:      Height:        Intake/Output  Summary (Last 24 hours) at 06/06/2023 1231 Last data filed at 06/06/2023 0600 Gross per 24 hour  Intake 710.58 ml  Output 500 ml  Net 210.58 ml   Filed Weights   06/03/23 0343 06/06/23 0931  Weight: 99.7 kg 99.7 kg    Telemetry    AV paced  - Personally Reviewed  ECG    No new tracings - Personally Reviewed  Physical Exam   GEN: No acute distress.   Neck: No JVD. Cardiac: RRR, no murmurs, rubs, or gallops.  Respiratory: Clear to auscultation bilaterally.  GI: Soft, nontender, non-distended.   MS: No edema; No deformity. Neuro:  Alert and oriented x 3; Nonfocal.  Psych: Normal affect.  Labs    Chemistry Recent Labs  Lab 06/03/23 0347 06/04/23 0537 06/05/23 1013 06/06/23 0420  NA 145 137 138 140  K 3.9 4.0 3.5 3.6  CL 105 104 106 110  CO2 24 25 24 24   GLUCOSE 137* 134* 135* 167*  BUN 25* 22 23 21   CREATININE 1.66* 1.84* 1.58* 1.54*  CALCIUM 9.0 8.0* 8.1* 8.0*  PROT 6.9  --   --   --   ALBUMIN 3.3*  --   --   --   AST 13*  --   --   --   ALT 10  --   --   --  ALKPHOS 64  --   --   --   BILITOT 0.5  --   --   --   GFRNONAA 40* 35* 43* 44*  ANIONGAP 16* 8 8 6      Hematology Recent Labs  Lab 06/04/23 0537 06/05/23 0431 06/06/23 0420  WBC 3.8* 3.5* 4.7  RBC 4.40 4.50 4.57  HGB 12.2* 12.5* 12.6*  HCT 39.0 38.9* 40.4  MCV 88.6 86.4 88.4  MCH 27.7 27.8 27.6  MCHC 31.3 32.1 31.2  RDW 15.0 14.9 14.8  PLT 150 138* 147*    Cardiac EnzymesNo results for input(s): "TROPONINI" in the last 168 hours. No results for input(s): "TROPIPOC" in the last 168 hours.   BNP Recent Labs  Lab 06/03/23 0417  BNP 1,620.5*     DDimer No results for input(s): "DDIMER" in the last 168 hours.   Radiology    DG Chest Portable 1 View  Result Date: 06/03/2023 IMPRESSION: 1. No radiographic evidence of acute cardiopulmonary disease. 2. Mild cardiomegaly. 3. Aortic atherosclerosis. Electronically Signed   By: Trudie Reed M.D.   On: 06/03/2023 05:12    Cardiac  Studies   Lexiscan MPI 05/30/2023:   Findings are consistent with infarction with peri-infarct ischemia. The study is high risk due to degree of cardiomyopathy   No ST deviation was noted.   LV perfusion is abnormal. There is evidence of ischemia. There is evidence of infarction. Defect 1: There is a medium defect with moderate reduction in uptake present in the apical to mid anterior and anteroseptal location(s) that is partially reversible. There is abnormal wall motion in the defect area. Consistent with infarction and peri-infarct ischemia.   Left ventricular function is abnormal. Nuclear stress EF: 33%. End diastolic cavity size is moderately enlarged. End systolic cavity size is moderately enlarged.   Suboptimal study due to GI uptake. __________   2D echo 12/16/2022: 1. Left ventricular ejection fraction, by estimation, is 30 to 35%. The  left ventricle has moderately decreased function. The left ventricle  demonstrates global hypokinesis with severe hypokinesis of the anterior  wall. The left ventricular internal  cavity size was mildly dilated. There is moderate asymmetric left  ventricular hypertrophy of the septal segment.   2. Right ventricle is not well visualized. Right ventricular systolic  function is grossly normal. The right ventricular size is normal.  Tricuspid regurgitation signal is inadequate for assessing PA pressure.   3. Left atrial size was mildly dilated.   4. The mitral valve is normal in structure. Trivial mitral valve  regurgitation. No evidence of mitral stenosis.   5. The aortic valve is normal in structure. Aortic valve regurgitation is  not visualized. Aortic valve sclerosis is present, with no evidence of  aortic valve stenosis.   6. The inferior vena cava is normal in size with greater than 50%  respiratory variability, suggesting right atrial pressure of 3 mmHg.   Patient Profile     88 y.o. 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 Clyde Lundborg.   Assessment & Plan    1. CAD status post CABG with atypical chest pain and abnormal Lexiscan MPI: -High-sensitivity troponin negative x 3 -Cardiac catheterization done this morning via  the left radial artery showed patent grafts with no significant change in coronary anatomy since most recent cardiac catheterization.  Recommend continuing medical therapy.   2.  HFrEF secondary to ICM: -Torsemide was held in anticipation of cardiac catheterization but his LVEDP today was 22 mmHg indicating volume overload.  I resumed torsemide 40 mg once daily.  Continue current heart failure medications.   3.  PAF: -Maintaining sinus rhythm with AV paced rhythm -PTA carvedilol -CHA2DS2-VASc at least 6 (CHF, HTN, age x 2, DM, vascular disease) -Will transition from heparin drip back to Eliquis 2.5 mg twice daily which can be resumed tonight.   4.  History of trifascicular block: -Status post CRT-P   5.  CKD stage III-IV: -Stable renal function.  Only 60 mL of contrast was used for the left heart cath procedure.   6.  HTN: -Blood pressure has been controlled but was elevated today during cardiac catheterization likely due to back pain and discomfort.   7.  HLD: -LDL 109 this admission, target LDL < 55 -PTA rosuvastatin 40 mg, reports adherence to statin -Now on Zetia 10 mg -If LDL remains above goal in the outpatient setting, look to add [id number]  The patient can be discharged home today if no other medical issues.  For questions or updates, please contact [facility] Please consult [email or web address] for contact info under Cardiology/STEMI.    Signed, Lorine Bears, MD [facility] 06/06/2023, 12:31 PM

## 2023-06-09 ENCOUNTER — Other Ambulatory Visit: Payer: Self-pay | Admitting: *Deleted

## 2023-06-09 DIAGNOSIS — I5022 Chronic systolic (congestive) heart failure: Secondary | ICD-10-CM

## 2023-06-10 ENCOUNTER — Ambulatory Visit (INDEPENDENT_AMBULATORY_CARE_PROVIDER_SITE_OTHER): Payer: Medicare HMO | Admitting: Podiatry

## 2023-06-10 ENCOUNTER — Encounter: Payer: Self-pay | Admitting: Podiatry

## 2023-06-10 VITALS — BP 166/94

## 2023-06-10 DIAGNOSIS — N1832 Chronic kidney disease, stage 3b: Secondary | ICD-10-CM

## 2023-06-10 DIAGNOSIS — E1122 Type 2 diabetes mellitus with diabetic chronic kidney disease: Secondary | ICD-10-CM

## 2023-06-10 DIAGNOSIS — M79675 Pain in left toe(s): Secondary | ICD-10-CM

## 2023-06-10 DIAGNOSIS — B351 Tinea unguium: Secondary | ICD-10-CM | POA: Diagnosis not present

## 2023-06-10 DIAGNOSIS — E119 Type 2 diabetes mellitus without complications: Secondary | ICD-10-CM | POA: Diagnosis not present

## 2023-06-10 DIAGNOSIS — B353 Tinea pedis: Secondary | ICD-10-CM

## 2023-06-10 DIAGNOSIS — M79674 Pain in right toe(s): Secondary | ICD-10-CM

## 2023-06-10 DIAGNOSIS — Z794 Long term (current) use of insulin: Secondary | ICD-10-CM

## 2023-06-10 MED ORDER — KETOCONAZOLE 2 % EX CREA: TOPICAL_CREAM | CUTANEOUS | 1 refills | Status: DC

## 2023-06-10 NOTE — Patient Instructions (Addendum)
To prevent reinfection, spray shoes with lysol every evening.  Clean tub or shower with bleach based cleanser.  Athlete's Foot Athlete's foot (tinea pedis) is a fungal infection of the skin on your feet. It often occurs on the skin that is between or underneath the toes. It can also occur on the soles of your feet. The infection can spread from person to person (is contagious). It can also spread when a person's bare feet come in contact with the fungus on shower floors or on items such as shoes. What are the causes? This condition is caused by a fungus that grows in warm, moist places. You can get athlete's foot by sharing shoes, shower stalls, towels, and wet floors with someone who is infected. Not washing your feet or changing your socks often enough can also lead to athlete's foot. What increases the risk? This condition is more likely to develop in: Men. People who have a weak body defense system (immune system). People who have diabetes. People who use public showers, such as at a gym. People who wear heavy-duty shoes, such as Youth worker. Seasons with warm, humid weather. What are the signs or symptoms? Symptoms of this condition include: Itchy areas between your toes or on the soles of your feet. White, flaky, or scaly areas between your toes or on the soles of your feet. Very itchy small blisters between your toes or on the soles of your feet. Small cuts in your skin. These cuts can become infected. Thick or discolored toenails. How is this diagnosed? This condition may be diagnosed with a physical exam and a review of your medical history. Your health care provider may also take a skin or toenail sample to examine under a microscope. How is this treated? This condition is treated with antifungal medicines. These may be applied as powders, ointments, or creams. In severe cases, an oral antifungal medicine may be given. Follow these instructions at  home: Medicines Apply or take over-the-counter and prescription medicines only as told by your health care provider. Apply your antifungal medicine as told by your health care provider. Do not stop using the antifungal even if your condition improves. Foot care Do not scratch your feet. Keep your feet dry: Wear cotton or wool socks. Change your socks every day or if they become wet. Wear shoes that allow air to flow, such as sandals or canvas tennis shoes. Wash and dry your feet, including the area between your toes. Also, wash and dry your feet: Every day or as told by your health care provider. After exercising. General instructions Do not let others use towels, shoes, nail clippers, or other personal items that touch your feet. Protect your feet by wearing sandals in wet areas, such as locker rooms and shared showers. Keep all follow-up visits. This is important. If you have diabetes, keep your blood sugar under control. Contact a health care provider if: You have a fever. You have swelling, soreness, warmth, or redness in your foot. Your feet are not getting better with treatment. Your symptoms get worse. You have new symptoms. You have severe pain. Summary Athlete's foot (tinea pedis) is a fungal infection of the skin on your feet. It often occurs on skin that is between or underneath the toes. This condition is caused by a fungus that grows in warm, moist places. Symptoms include white, flaky, or scaly areas between your toes or on the soles of your feet. This condition is treated with antifungal medicines.  Keep your feet clean. Always dry them thoroughly. This information is not intended to replace advice given to you by your health care provider. Make sure you discuss any questions you have with your health care provider. Document Revised: 02/15/2021 Document Reviewed: 02/15/2021 Elsevier Patient Education  2024 [employer]   Diabetes Mellitus and Foot Care Diabetes,  also called diabetes mellitus, may cause problems with your feet and legs because of poor blood flow (circulation). Poor circulation may make your skin: Become thinner and drier. Break more easily. Heal more slowly. Peel and crack. You may also have nerve damage (neuropathy). This can cause decreased feeling in your legs and feet. This means that you may not notice minor injuries to your feet that could lead to more serious problems. Finding and treating problems early is the best way to prevent future foot problems. How to care for your feet Foot hygiene  Wash your feet daily with warm water and mild soap. Do not use hot water. Then, pat your feet and the areas between your toes until they are fully dry. Do not soak your feet. This can dry your skin. Trim your toenails straight across. Do not dig under them or around the cuticle. File the edges of your nails with an emery board or nail file. Apply a moisturizing lotion or petroleum jelly to the skin on your feet and to dry, brittle toenails. Use lotion that does not contain alcohol and is unscented. Do not apply lotion between your toes. Shoes and socks Wear clean socks or stockings every day. Make sure they are not too tight. Do not wear knee-high stockings. These may decrease blood flow to your legs. Wear shoes that fit well and have enough cushioning. Always look in your shoes before you put them on to be sure there are no objects inside. To break in new shoes, wear them for just a few hours a day. This prevents injuries on your feet. Wounds, scrapes, corns, and calluses  Check your feet daily for blisters, cuts, bruises, sores, and redness. If you cannot see the bottom of your feet, use a mirror or ask someone for help. Do not cut off corns or calluses or try to remove them with medicine. If you find a minor scrape, cut, or break in the skin on your feet, keep it and the skin around it clean and dry. You may clean these areas with mild soap  and water. Do not clean the area with peroxide, alcohol, or iodine. If you have a wound, scrape, corn, or callus on your foot, look at it several times a day to make sure it is healing and not infected. Check for: Redness, swelling, or pain. Fluid or blood. Warmth. Pus or a bad smell. General tips Do not cross your legs. This may decrease blood flow to your feet. Do not use heating pads or hot water bottles on your feet. They may burn your skin. If you have lost feeling in your feet or legs, you may not know this is happening until it is too late. Protect your feet from hot and cold by wearing shoes, such as at the beach or on hot pavement. Schedule a complete foot exam at least once a year or more often if you have foot problems. Report any cuts, sores, or bruises to your health care provider right away. Where to find more information American Diabetes Association: diabetes.org Association of Diabetes Care & Education Specialists: diabeteseducator.org Contact a health care provider if: You have  a condition that increases your risk of infection, and you have any cuts, sores, or bruises on your feet. You have an injury that is not healing. You have redness on your legs or feet. You feel burning or tingling in your legs or feet. You have pain or cramps in your legs and feet. Your legs or feet are numb. Your feet always feel cold. You have pain around any toenails. Get help right away if: You have a wound, scrape, corn, or callus on your foot and: You have signs of infection. You have a fever. You have a red line going up your leg. This information is not intended to replace advice given to you by your health care provider. Make sure you discuss any questions you have with your health care provider. Document Revised: 04/28/2022 Document Reviewed: 04/28/2022 Elsevier Patient Education  2024 [employer].

## 2023-06-10 NOTE — Progress Notes (Signed)
Subjective: [name] presents today for diabetic foot evaluation.  Chief Complaint  Patient presents with   Nail Problem    A1C:7.0-DFReferring Provider Idelia Salm, MD   C,LOV:07/24   Patient denies any numbness, tingling, burning, or pins/needle sensation in feet.  Risk factors: diabetes, h/o MI, HTN, CAD, CHF, CKD, hyperlipidemia, dyslipidemia, h/o tobacco use in remission.  PCP is Idelia Salm, MD.  Past Medical History:  Diagnosis Date   AV block, [place] 1    CAD (coronary artery disease)    a. 08/2019 VF arrest-->sev 3vd on cath-->CABGx5 (LIMA->LAD, VG->OM->LCX, VG->RPDA, VG->Diag); b. 08/2021 Cath: sev native multivessel dzs w/ 5/5 patent grafts. Nl filling pressures->Med rx.   CKD (chronic kidney disease), stage III (HCC)    Diabetes mellitus without complication (HCC)    HFrEF (heart failure with reduced ejection fraction) (HCC)    a. 08/2020 Echo: EF 40-45%; b. 11/2020 Echo: EF 55%, Gr2 DD; c. 08/2021 Echo: EF 35-40%, glob HK w/ ant/antsept/apical HK; d. 10/2021 s/p CRT-P; e. 12/2021 Echo: EF 30-35%, glob HK, GrIII DD; f. 12/2022 Echo: EF 30-35%, glob HK, sev HK of ant wall, mod asymm LVH, nl RV fxn, mildly dil LA, triv MR, AoV sclerosis.   Hyperlipidemia LDL goal <70    Hypertension    Intermittent Complete heart block (HCC)    a. 08/2021 noted on Zio; b. 10/2021 s/p Abbott Allure RF CRT-P (Ser # [id number]).   Ischemic cardiomyopathy    a. 08/2020 Echo: EF 40-45%; b. 11/2020 Echo: EF 55%; c. 08/2021 Echo: EF 35-40%; d. 10/2021 s/p CRT-P; e. 12/2021 Echo: EF 30-35%, glob HK, GrIII DD.   PAF (paroxysmal atrial fibrillation) (HCC)    a.Medical device check-->Eliquis (CHA2DS2VASc = 6)   Trifascicular block     Patient Active Problem List   Diagnosis Date Noted   ACS (acute coronary syndrome) (HCC) 06/03/2023   Chronic systolic CHF (congestive heart failure) (HCC) 06/03/2023   Cardiac resynchronization therapy pacemaker (CRT-P) in place 05/10/2023    Acute on chronic congestive heart failure (HCC) 03/09/2023   Paroxysmal A-fib (HCC) 03/09/2023   Hypomagnesemia 03/09/2023   Hypercoagulable state due to paroxysmal atrial fibrillation (HCC) 02/08/2023   Paroxysmal atrial fibrillation (HCC) 02/08/2023   Constipation 12/19/2022   Chest pain 12/19/2022   Hypertensive heart disease with heart failure (HCC) 12/18/2022   Thrombocytopenia (HCC) 12/17/2022   CKD stage 3b, GFR 30-44 ml/min (HCC) 12/17/2022   Obesity (BMI 30-39.9) 12/17/2022   Hypokalemia 12/17/2022   Ischemic cardiomyopathy 12/17/2022   Hypertensive urgency 03/29/2022   Dyslipidemia 03/29/2022   GERD without esophagitis 03/29/2022   Type 2 diabetes mellitus with hyperlipidemia (HCC) 03/29/2022   Elevated troponin 03/29/2022   Acute on chronic systolic CHF (congestive heart failure) (HCC) 01/04/2022   Dilated cardiomyopathy (HCC) 10/30/2021   Acute decompensated heart failure (HCC) 10/27/2021   Complete heart block (HCC) 10/12/2021   Junctional bradycardia    Acute on chronic diastolic CHF (congestive heart failure) (HCC) 08/26/2021   CAD (coronary artery disease)    HLD (hyperlipidemia)    GERD (gastroesophageal reflux disease)    Type II diabetes mellitus with renal manifestations (HCC)    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   CAD, multiple vessel 08/25/2020   Non-ST elevation (NSTEMI) myocardial infarction (HCC) 08/21/2020   History of cardiac arrest 08/21/2020   HFrEF (heart failure with reduced ejection fraction) (HCC)    History of sustained ventricular fibrillation 08/18/2020   Acute respiratory failure with hypoxia (HCC)    Chronic diastolic  heart failure (HCC)    UTI (urinary tract infection) 07/26/2019   HTN (hypertension) 07/26/2019   Diabetes (HCC) 07/26/2019   Unstable angina (HCC) 12/03/2008   Status post percutaneous transluminal coronary angioplasty 10/08/2008   Malignant neoplasm of prostate (HCC) 10/05/2005    Past Surgical History:  Procedure Laterality  Date   BACK SURGERY     CARDIAC CATHETERIZATION     CLIPPING OF ATRIAL APPENDAGE N/A 08/29/2020   Procedure: CLIPPING OF ATRIAL APPENDAGE USING ATRICURE 45 MM ATRICLIP FLEX-V;  Surgeon: Delight Ovens, MD;  Location: [facility] OR;  Service: Open Heart Surgery;  Laterality: N/A;   CORONARY ARTERY BYPASS GRAFT N/A 08/29/2020   Procedure: CORONARY ARTERY BYPASS GRAFTING (CABG) X 5 USING LEFT INTERNAL MAMMARY ARTERY AND ENDOSCOPICALLY HARVESTED RIGHT GREATER SAPHENOUS VEIN. LIMA TO LAD, SVG TO OM SEQ TO CIRC, SVG TO PD, SVG TO DIAG.;  Surgeon: Delight Ovens, MD;  Location: [facility] OR;  Service: Open Heart Surgery;  Laterality: N/A;   ENDOVEIN HARVEST OF GREATER SAPHENOUS VEIN Right 08/29/2020   Procedure: ENDOVEIN HARVEST OF GREATER SAPHENOUS VEIN;  Surgeon: Delight Ovens, MD;  Location: [facility] OR;  Service: Open Heart Surgery;  Laterality: Right;   HERNIA REPAIR     LEFT HEART CATH AND CORONARY ANGIOGRAPHY N/A 08/25/2020   Procedure: LEFT HEART CATH AND CORONARY ANGIOGRAPHY;  Surgeon: Iran Ouch, MD;  Location: [facility] INVASIVE CV LAB;  Service: Cardiovascular;  Laterality: N/A;   PACEMAKER IMPLANT N/A 10/12/2021   Procedure: PACEMAKER IMPLANT;  Surgeon: Lanier Prude, MD;  Location: [facility] INVASIVE CV LAB;  Service: Cardiovascular;  Laterality: N/A;   RIGHT/LEFT HEART CATH AND CORONARY ANGIOGRAPHY N/A 08/31/2021   Procedure: RIGHT/LEFT HEART CATH AND CORONARY ANGIOGRAPHY;  Surgeon: Iran Ouch, MD;  Location: [facility] INVASIVE CV LAB;  Service: Cardiovascular;  Laterality: N/A;   RIGHT/LEFT HEART CATH AND CORONARY/GRAFT ANGIOGRAPHY N/A 06/06/2023   Procedure: RIGHT/LEFT HEART CATH AND CORONARY/GRAFT ANGIOGRAPHY;  Surgeon: Iran Ouch, MD;  Location: [facility] INVASIVE CV LAB;  Service: Cardiovascular;  Laterality: N/A;   TEE WITHOUT CARDIOVERSION N/A 08/29/2020   Procedure: TRANSESOPHAGEAL ECHOCARDIOGRAM (TEE);  Surgeon: Delight Ovens, MD;  Location: [facility];  Service: Open Heart Surgery;   Laterality: N/A;    Current Outpatient Medications on File Prior to Visit  Medication Sig Dispense Refill   acetaminophen (TYLENOL) 500 MG tablet Take 1,000 mg by mouth every 6 (six) hours as needed for mild pain, fever or headache.     acyclovir (ZOVIRAX) 800 MG tablet Take 1 tablet by mouth daily.     albuterol (VENTOLIN HFA) 108 (90 Base) MCG/ACT inhaler Inhale 2 puffs into the lungs every 6 (six) hours as needed for wheezing or shortness of breath.     apixaban (ELIQUIS) 2.5 MG TABS tablet Take 1 tablet (2.5 mg total) by mouth 2 (two) times daily. 60 tablet 6   aspirin EC 81 MG tablet Take 1 tablet (81 mg total) by mouth daily. Swallow whole. 30 tablet 0   carvedilol (COREG) 6.25 MG tablet Take 1 tablet (6.25 mg total) by mouth 2 (two) times daily with a meal. 180 tablet 3   dapagliflozin propanediol (FARXIGA) 10 MG TABS tablet Take 10 mg by mouth daily.     diclofenac Sodium (VOLTAREN) 1 % GEL Apply 2 g topically 4 (four) times daily.     ezetimibe (ZETIA) 10 MG tablet Take 1 tablet (10 mg total) by mouth daily. 30 tablet 0   fluticasone (FLOVENT HFA) 110  MCG/ACT inhaler Inhale 1 puff into the lungs daily.     hydrALAZINE (APRESOLINE) 25 MG tablet Take 25 mg by mouth 2 times daily at 12 noon and 4 pm.     insulin degludec (TRESIBA) 100 UNIT/ML FlexTouch Pen Inject 0.15 mLs into the skin at bedtime.     isosorbide mononitrate (IMDUR) 60 MG 24 hr tablet Take 1 tablet (60 mg total) by mouth daily. 30 tablet 0   magnesium oxide (MAG-OX) 400 MG tablet Take 1 tablet (400 mg total) by mouth daily. 90 tablet 1   metolazone (ZAROXOLYN) 2.5 MG tablet TAKE 1 TABLET (2.5) MG 1 HOUR BEFORE LASIX WEEKLY ON TUESDAYS 12 tablet 3   pantoprazole (PROTONIX) 40 MG tablet Take 40 mg by mouth 2 (two) times daily.     polyethylene glycol (MIRALAX / GLYCOLAX) 17 g packet Take 17 g by mouth daily as needed for moderate constipation. 30 each 0   Potassium Chloride ER 20 MEQ TBCR Take 1 tablet (20 mEq total) by  mouth daily. 30 tablet 6   rosuvastatin (CRESTOR) 40 MG tablet Take 1 tablet (40 mg total) by mouth daily. 90 tablet 3   sacubitril-valsartan (ENTRESTO) 97-103 MG Take 1 tablet by mouth 2 (two) times daily. 60 tablet 6   spironolactone (ALDACTONE) 25 MG tablet Take 0.5 tablets (12.5 mg total) by mouth daily. 45 tablet 3   torsemide (DEMADEX) 20 MG tablet Take 2 tablets (40 mg total) by mouth daily.     Vericiguat (VERQUVO) 10 MG TABS Take 1 tablet (10 mg total) by mouth daily. 30 tablet 5   No current facility-administered medications on file prior to visit.     Allergies  Allergen Reactions   Angiotensin Receptor Blockers     hyperkalemia   Spironolactone     Hyperkalemia    Metformin Diarrhea    Social History   Occupational History   Not on file  Tobacco Use   Smoking status: Former    Current packs/day: 0.00    Types: Cigarettes    Quit date: 07/26/1996    Years since quitting: 26.9   Smokeless tobacco: Never  Vaping Use   Vaping status: Never Used  Substance and Sexual Activity   Alcohol use: Not Currently   Drug use: Never   Sexual activity: Yes    Family History  Problem Relation Age of Onset   Heart attack Mother     Immunization History  Administered Date(s) Administered   Covid-19, Mrna,Vaccine(Spikevax)39yrs and older 10/19/2022   Fluad Quad(high Dose 65+) 08/29/2021, 10/19/2022   H1N1 09/26/2008   Influenza, High Dose Seasonal PF 08/25/2019   Influenza,inj,Quad PF,6+ Mos 07/10/2013, 08/19/2014, 11/13/2015, 08/25/2017, 08/10/2018, 12/29/2020   Influenza,trivalent, recombinat, inj, PF 09/06/2006, 09/05/2007, 09/30/2009   Influenza-Unspecified 10/05/2011, 10/01/2015   PFIZER(Purple Top)SARS-COV-2 Vaccination 12/07/2019, 12/28/2019, 11/17/2020   Pneumococcal Conjugate-13 07/09/2014   Pneumococcal Polysaccharide-23 09/25/2002, 08/25/2017   Tdap 11/20/2019   Zoster Recombinant(Shingrix) 01/23/2015, 03/23/2021    Objective: Vitals:   06/10/23 0911   BP: (!) 166/94   [name] is a pleasant 88 y.o. male obese in NAD. AAO X 3.  Vascular Examination: CFT <3 seconds b/l. DP/PT pulses faintly palpable b/l. Skin temperature gradient warm to warm b/l. No pain with calf compression. No ischemia or gangrene. No cyanosis or clubbing noted b/l. No edema noted b/l LE.   Neurological Examination: Sensation grossly intact b/l with 10 gram monofilament. Vibratory sensation intact b/l.   Dermatological Examination: Pedal skin warm and supple b/l.  No open wounds. No interdigital macerations.  Toenails 1-5 b/l thick, discolored, elongated with subungual debris and pain on dorsal palpation.    No hyperkeratotic nor porokeratotic lesions present on today's visit. Diffuse scaling noted peripherally and plantarly b/l feet.  No interdigital macerations.  No blisters, no weeping. No signs of secondary bacterial infection noted.  Musculoskeletal Examination: Muscle strength 5/5 to all lower extremity muscle groups bilaterally. No pain, crepitus or joint limitation noted with ROM bilateral LE. No gross bony deformities bilaterally.  Radiographs: None  Last A1c:      Latest Ref Rng & Units 06/03/2023    5:44 AM 12/15/2022    2:09 PM  Hemoglobin A1C  Hemoglobin-A1c 4.8 - 5.6 % 7.0  7.9    Assessment: 1. Pain due to onychomycosis of toenails of both feet   2. Tinea pedis of both feet   3. Type 2 diabetes mellitus with stage 3b chronic kidney disease, with long-term current use of insulin (HCC)   4. Encounter for diabetic foot exam (HCC)     ADA Risk Categorization: Low Risk:  Patient has all of the following: Intact protective sensation No prior foot ulcer  No severe deformity Pedal pulses present  Plan: -Consent given for treatment as described below: -Examined patient. -Diabetic foot examination performed today. -Continue diabetic foot care principles: inspect feet daily, monitor glucose as recommended by PCP and/or Endocrinologist,  and follow prescribed diet per PCP, Endocrinologist and/or dietician. -Patient to continue soft, supportive shoe gear daily. -Toenails 1-5 b/l were debrided in length and girth with sterile nail nippers and dremel without iatrogenic bleeding.  -Discussed tinea pedis infection. To prevent re-infection of tinea pedis, patient/POA/caregiver instructed to spray shoes with Lysol every evening and clean tub/shower with bleach based cleanser. -For tinea pedis, Rx sent to pharmacy for Ketoconazole Cream 2% to be applied once daily for six weeks. -Patient/POA to call should there be question/concern in the interim.  Return in about 3 months (around 09/10/2023).  Freddie Breech, DPM
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
## 2023-06-15 ENCOUNTER — Encounter: Payer: Medicare HMO | Admitting: Family

## 2023-06-15 ENCOUNTER — Telehealth: Payer: Self-pay | Admitting: Family

## 2023-06-15 NOTE — Telephone Encounter (Signed)
Patient did not show for his [facility] appointment on 06/15/23.

## 2023-06-17 ENCOUNTER — Ambulatory Visit: Payer: Medicare HMO | Admitting: Cardiology

## 2023-07-06 ENCOUNTER — Encounter: Payer: Self-pay | Admitting: Nurse Practitioner

## 2023-07-06 ENCOUNTER — Ambulatory Visit: Payer: Medicare HMO | Attending: Nurse Practitioner | Admitting: Nurse Practitioner

## 2023-07-06 VITALS — BP 138/74 | HR 63 | Ht 68.0 in | Wt 208.0 lb

## 2023-07-06 DIAGNOSIS — I2510 Atherosclerotic heart disease of native coronary artery without angina pectoris: Secondary | ICD-10-CM | POA: Diagnosis not present

## 2023-07-06 DIAGNOSIS — I5022 Chronic systolic (congestive) heart failure: Secondary | ICD-10-CM | POA: Diagnosis not present

## 2023-07-06 DIAGNOSIS — Z794 Long term (current) use of insulin: Secondary | ICD-10-CM

## 2023-07-06 DIAGNOSIS — N183 Chronic kidney disease, stage 3 unspecified: Secondary | ICD-10-CM

## 2023-07-06 DIAGNOSIS — E1122 Type 2 diabetes mellitus with diabetic chronic kidney disease: Secondary | ICD-10-CM

## 2023-07-06 DIAGNOSIS — I480 Paroxysmal atrial fibrillation: Secondary | ICD-10-CM | POA: Diagnosis not present

## 2023-07-06 DIAGNOSIS — E785 Hyperlipidemia, unspecified: Secondary | ICD-10-CM

## 2023-07-06 DIAGNOSIS — I255 Ischemic cardiomyopathy: Secondary | ICD-10-CM

## 2023-07-06 DIAGNOSIS — I10 Essential (primary) hypertension: Secondary | ICD-10-CM

## 2023-07-06 DIAGNOSIS — N1832 Chronic kidney disease, stage 3b: Secondary | ICD-10-CM

## 2023-07-06 DIAGNOSIS — I453 Trifascicular block: Secondary | ICD-10-CM
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
## 2023-07-06 NOTE — Patient Instructions (Signed)
Medication Instructions:  Your Physician recommend you continue on your current medication as directed.    *If you need a refill on your cardiac medications before your next appointment, please call your pharmacy*   Lab Work: Your provider would like for you to have following labs drawn today BMET.   If you have labs (blood work) drawn today and your tests are completely normal, you will receive your results only by: MyChart Message (if you have MyChart) OR A paper copy in the mail If you have any lab test that is abnormal or we need to change your treatment, we will call you to review the results.   Testing/Procedures: NONE  Follow-Up: At [facility], you and your health needs are our priority.  As part of our continuing mission to provide you with exceptional heart care, we have created designated Provider Care Teams.  These Care Teams include your primary Cardiologist (physician) and Advanced Practice Providers (APPs -  Physician Assistants and Nurse Practitioners) who all work together to provide you with the care you need, when you need it.  We recommend signing up for the patient portal called "MyChart".  Sign up information is provided on this After Visit Summary.  MyChart is used to connect with patients for Virtual Visits (Telemedicine).  Patients are able to view lab/test results, encounter notes, upcoming appointments, etc.  Non-urgent messages can be sent to your provider as well.   To learn more about what you can do with MyChart, go to [email or web address].    Your next appointment:   1 month(s)  Provider:   Follow-up with [facility], Dr. Clarisa Kindred

## 2023-07-07 LAB — BASIC METABOLIC PANEL
BUN/Creatinine Ratio: 16 (ref 10–24)
BUN: 28 mg/dL — ABNORMAL HIGH (ref 8–27)
CO2: 20 mmol/L (ref 20–29)
Calcium: 8.7 mg/dL (ref 8.6–10.2)
Chloride: 108 mmol/L — ABNORMAL HIGH (ref 96–106)
Creatinine, Ser: 1.76 mg/dL — ABNORMAL HIGH (ref 0.76–1.27)
Glucose: 100 mg/dL — ABNORMAL HIGH (ref 70–99)
Potassium: 4.1 mmol/L (ref 3.5–5.2)
Sodium: 142 mmol/L (ref 134–144)
eGFR: 37 mL/min/{1.73_m2} — ABNORMAL LOW (ref >59)

## 2023-07-08 ENCOUNTER — Other Ambulatory Visit: Payer: Self-pay | Admitting: *Deleted

## 2023-07-08 MED ORDER — METOLAZONE 2.5 MG PO TABS: ORAL_TABLET | ORAL | 6 refills | Status: DC

## 2023-07-12 ENCOUNTER — Ambulatory Visit (INDEPENDENT_AMBULATORY_CARE_PROVIDER_SITE_OTHER): Payer: Medicaid Other

## 2023-07-12 DIAGNOSIS — I255 Ischemic cardiomyopathy: Secondary | ICD-10-CM

## 2023-07-12 DIAGNOSIS — I5022 Chronic systolic (congestive) heart failure: Secondary | ICD-10-CM

## 2023-07-12 LAB — CUP PACEART REMOTE DEVICE CHECK
Battery Remaining Longevity: 63 mo
Battery Remaining Percentage: 70 %
Battery Voltage: 2.99 V
Brady Statistic AP VP Percent: 75 %
Brady Statistic AP VS Percent: 1 %
Brady Statistic AS VP Percent: 23 %
Brady Statistic AS VS Percent: 1 %
Brady Statistic RA Percent Paced: 73 %
Date Time Interrogation Session: 20240903020017
Implantable Lead Connection Status: 753985
Implantable Lead Connection Status: 753985
Implantable Lead Connection Status: 753985
Implantable Lead Implant Date: 20221205
Implantable Lead Implant Date: 20221205
Implantable Lead Implant Date: 20221205
Implantable Lead Location: 753858
Implantable Lead Location: 753859
Implantable Lead Location: 753860
Implantable Lead Model: 3830
Implantable Pulse Generator Implant Date: 20221205
Lead Channel Impedance Value: 410 Ohm
Lead Channel Impedance Value: 410 Ohm
Lead Channel Impedance Value: 460 Ohm
Lead Channel Pacing Threshold Amplitude: 0.5 V
Lead Channel Pacing Threshold Amplitude: 0.625 V
Lead Channel Pacing Threshold Amplitude: 0.875 V
Lead Channel Pacing Threshold Pulse Width: 0.5 ms
Lead Channel Pacing Threshold Pulse Width: 0.5 ms
Lead Channel Pacing Threshold Pulse Width: 0.5 ms
Lead Channel Sensing Intrinsic Amplitude: 12 mV
Lead Channel Sensing Intrinsic Amplitude: 2.6 mV
Lead Channel Setting Pacing Amplitude: 1.5 V
Lead Channel Setting Pacing Amplitude: 2 V
Lead Channel Setting Pacing Amplitude: 2 V
Lead Channel Setting Pacing Pulse Width: 0.5 ms
Lead Channel Setting Pacing Pulse Width: 0.5 ms
Lead Channel Setting Sensing Sensitivity: 2 mV
Pulse Gen Model: 3222
Pulse Gen Serial Number: 3901949

## 2023-07-20 NOTE — Progress Notes (Signed)
Remote pacemaker transmission.   

## 2023-07-31 ENCOUNTER — Other Ambulatory Visit: Payer: Self-pay | Admitting: Family

## 2023-08-09 ENCOUNTER — Encounter: Payer: Self-pay | Admitting: Family

## 2023-08-09 ENCOUNTER — Ambulatory Visit: Payer: Medicare HMO | Attending: Family | Admitting: Family

## 2023-08-09 VITALS — BP 146/73 | HR 63 | Wt 217.0 lb

## 2023-08-09 DIAGNOSIS — Z950 Presence of cardiac pacemaker: Secondary | ICD-10-CM | POA: Insufficient documentation

## 2023-08-09 DIAGNOSIS — I5022 Chronic systolic (congestive) heart failure: Secondary | ICD-10-CM

## 2023-08-09 DIAGNOSIS — E785 Hyperlipidemia, unspecified: Secondary | ICD-10-CM | POA: Diagnosis not present

## 2023-08-09 DIAGNOSIS — Z794 Long term (current) use of insulin: Secondary | ICD-10-CM

## 2023-08-09 DIAGNOSIS — I2510 Atherosclerotic heart disease of native coronary artery without angina pectoris: Secondary | ICD-10-CM

## 2023-08-09 DIAGNOSIS — Z7984 Long term (current) use of oral hypoglycemic drugs: Secondary | ICD-10-CM | POA: Diagnosis not present

## 2023-08-09 DIAGNOSIS — Z79899 Other long term (current) drug therapy: Secondary | ICD-10-CM | POA: Insufficient documentation

## 2023-08-09 DIAGNOSIS — Z8674 Personal history of sudden cardiac arrest: Secondary | ICD-10-CM | POA: Diagnosis not present

## 2023-08-09 DIAGNOSIS — I4891 Unspecified atrial fibrillation: Secondary | ICD-10-CM | POA: Diagnosis not present

## 2023-08-09 DIAGNOSIS — Z87891 Personal history of nicotine dependence: Secondary | ICD-10-CM | POA: Diagnosis not present

## 2023-08-09 DIAGNOSIS — I890 Lymphedema, not elsewhere classified: Secondary | ICD-10-CM | POA: Diagnosis not present

## 2023-08-09 DIAGNOSIS — I10 Essential (primary) hypertension: Secondary | ICD-10-CM

## 2023-08-09 DIAGNOSIS — I480 Paroxysmal atrial fibrillation: Secondary | ICD-10-CM

## 2023-08-09 DIAGNOSIS — N189 Chronic kidney disease, unspecified: Secondary | ICD-10-CM | POA: Diagnosis not present

## 2023-08-09 DIAGNOSIS — N1832 Chronic kidney disease, stage 3b: Secondary | ICD-10-CM

## 2023-08-09 DIAGNOSIS — Z951 Presence of aortocoronary bypass graft: Secondary | ICD-10-CM | POA: Diagnosis not present

## 2023-08-09 DIAGNOSIS — I272 Pulmonary hypertension, unspecified: Secondary | ICD-10-CM | POA: Diagnosis not present

## 2023-08-09 DIAGNOSIS — I130 Hypertensive heart and chronic kidney disease with heart failure and stage 1 through stage 4 chronic kidney disease, or unspecified chronic kidney disease: Secondary | ICD-10-CM | POA: Diagnosis not present

## 2023-08-09 DIAGNOSIS — Z7901 Long term (current) use of anticoagulants: Secondary | ICD-10-CM | POA: Insufficient documentation

## 2023-08-09 DIAGNOSIS — E1122 Type 2 diabetes mellitus with diabetic chronic kidney disease: Secondary | ICD-10-CM | POA: Insufficient documentation

## 2023-08-09 MED ORDER — EZETIMIBE 10 MG PO TABS
10.0000 mg | ORAL_TABLET | Freq: Every day | ORAL | 5 refills | Status: DC
Start: 2023-08-09 — End: 2023-12-23
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
## 2023-08-10 ENCOUNTER — Telehealth: Payer: Self-pay

## 2023-08-10 DIAGNOSIS — I5022 Chronic systolic (congestive) heart failure: Secondary | ICD-10-CM

## 2023-08-10 LAB — BASIC METABOLIC PANEL
BUN/Creatinine Ratio: 17 (ref 10–24)
BUN: 29 mg/dL — ABNORMAL HIGH (ref 8–27)
CO2: 22 mmol/L (ref 20–29)
Calcium: 9.3 mg/dL (ref 8.6–10.2)
Chloride: 105 mmol/L (ref 96–106)
Creatinine, Ser: 1.75 mg/dL — ABNORMAL HIGH (ref 0.76–1.27)
Glucose: 132 mg/dL — ABNORMAL HIGH (ref 70–99)
Potassium: 4.3 mmol/L (ref 3.5–5.2)
Sodium: 143 mmol/L (ref 134–144)
eGFR: 38 mL/min/{1.73_m2} — ABNORMAL LOW (ref >59)

## 2023-08-10 LAB — PRO B NATRIURETIC PEPTIDE: NT-Pro BNP: 6649 pg/mL — ABNORMAL HIGH (ref 0–486)

## 2023-08-10 NOTE — Telephone Encounter (Signed)
-----   Message from Delma Freeze sent at 08/10/2023 10:55 AM EDT ----- Kidney function is stable but one result is elevated indicating you are retaining more fluid than you should be. Start taking your metolazone 2.5mg  (booster fluid pill) on Tuesdays & Fridays. Please come for repeat lab work in 2 weeks (proBNP & BMP)

## 2023-08-23 ENCOUNTER — Other Ambulatory Visit
Admission: RE | Admit: 2023-08-23 | Discharge: 2023-08-23 | Disposition: A | Discharge status: Home / Self Care | Payer: Medicare HMO | Attending: Family | Admitting: Family

## 2023-08-23 DIAGNOSIS — I5022 Chronic systolic (congestive) heart failure: Secondary | ICD-10-CM | POA: Insufficient documentation

## 2023-08-23 LAB — BASIC METABOLIC PANEL
Anion gap: 9 (ref 5–15)
BUN: 38 mg/dL — ABNORMAL HIGH (ref 8–23)
CO2: 26 mmol/L (ref 22–32)
Calcium: 8.2 mg/dL — ABNORMAL LOW (ref 8.9–10.3)
Chloride: 102 mmol/L (ref 98–111)
Creatinine, Ser: 1.75 mg/dL — ABNORMAL HIGH (ref 0.61–1.24)
GFR, Estimated: 37 mL/min — ABNORMAL LOW (ref >60)
Glucose, Bld: 183 mg/dL — ABNORMAL HIGH (ref 70–99)
Potassium: 3.4 mmol/L — ABNORMAL LOW (ref 3.5–5.1)
Sodium: 137 mmol/L (ref 135–145)

## 2023-08-23 LAB — BRAIN NATRIURETIC PEPTIDE: B Natriuretic Peptide: 1190.7 pg/mL — ABNORMAL HIGH (ref 0.0–100.0)

## 2023-09-08 ENCOUNTER — Ambulatory Visit: Payer: Medicare HMO | Admitting: Cardiology

## 2023-09-08 ENCOUNTER — Encounter: Payer: Self-pay | Admitting: Cardiology

## 2023-09-08 ENCOUNTER — Other Ambulatory Visit
Admission: RE | Admit: 2023-09-08 | Discharge: 2023-09-08 | Disposition: A | Discharge status: Home / Self Care | Payer: Medicare HMO | Source: Ambulatory Visit | Attending: Cardiology | Admitting: Cardiology

## 2023-09-08 VITALS — BP 136/75 | HR 63 | Wt 219.0 lb

## 2023-09-08 DIAGNOSIS — I5022 Chronic systolic (congestive) heart failure: Secondary | ICD-10-CM | POA: Diagnosis present

## 2023-09-08 LAB — BASIC METABOLIC PANEL
Anion gap: 9 (ref 5–15)
BUN: 28 mg/dL — ABNORMAL HIGH (ref 8–23)
CO2: 25 mmol/L (ref 22–32)
Calcium: 7.8 mg/dL — ABNORMAL LOW (ref 8.9–10.3)
Chloride: 104 mmol/L (ref 98–111)
Creatinine, Ser: 1.56 mg/dL — ABNORMAL HIGH (ref 0.61–1.24)
GFR, Estimated: 43 mL/min — ABNORMAL LOW (ref >60)
Glucose, Bld: 126 mg/dL — ABNORMAL HIGH (ref 70–99)
Potassium: 3.1 mmol/L — ABNORMAL LOW (ref 3.5–5.1)
Sodium: 138 mmol/L (ref 135–145)

## 2023-09-08 MED ORDER — SPIRONOLACTONE 25 MG PO TABS: 25.0000 mg | ORAL_TABLET | Freq: Every day | ORAL | 3 refills | Status: DC

## 2023-09-08 NOTE — Patient Instructions (Addendum)
Medication Changes:  Please stop taking Potassium.  Increase Spironolactone to 25 mg (1 tablet) daily.   Lab Work:  Labs done today, your results will be available in MyChart, we will contact you for abnormal readings.  You have labs in 10 days. Please come to the medical mall entrance before 4:30 pm to have your labs completed.     Special Instructions // Education:  Do the following things EVERYDAY: Weigh yourself in the morning before breakfast. Write it down and keep it in a log. Take your medicines as prescribed Eat low salt foods--Limit salt (sodium) to 2000 mg per day.  Stay as active as you can everyday Limit all fluids for the day to less than 2 liters   Follow-Up in: Please come to your pharmacy appointment to see Nason and please bring all of your medications.   Please call in December to schedule your January appointment with Dr. Shirlee Latch.    If you have any questions or concerns before your next appointment please send [place] a message through [place] or call our office at [phone] Monday-Friday 8 am-5 pm.   If you have an urgent need after hours on the weekend please call your Primary Cardiologist or the [facility] in [place] at [phone].   At the [facility], you and your health needs are our priority. We have a designated team specialized in the treatment of Heart Failure. This Care Team includes your primary Heart Failure Specialized Cardiologist (physician), Advanced Practice Providers (APPs- Physician Assistants and Nurse Practitioners), and Pharmacist who all work together to provide you with the care you need, when you need it.   You may see any of the following providers on your designated Care Team at your next follow up:  Dr. Arvilla Meres Dr. Marca Ancona Dr. Dorthula Nettles Dr. Theresia Bough Tonye Becket, NP Robbie Lis, [place] [address] [place], [place] Brynda Peon, NP [place] Lee, NP Clarisa Kindred, NP Enos Fling, PharmD
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
## 2023-09-12 ENCOUNTER — Telehealth: Payer: Self-pay

## 2023-09-12 ENCOUNTER — Ambulatory Visit (INDEPENDENT_AMBULATORY_CARE_PROVIDER_SITE_OTHER): Payer: Medicare HMO | Admitting: Podiatry

## 2023-09-12 ENCOUNTER — Encounter: Payer: Self-pay | Admitting: Podiatry

## 2023-09-12 DIAGNOSIS — E1122 Type 2 diabetes mellitus with diabetic chronic kidney disease: Secondary | ICD-10-CM | POA: Diagnosis not present

## 2023-09-12 DIAGNOSIS — M79674 Pain in right toe(s): Secondary | ICD-10-CM

## 2023-09-12 DIAGNOSIS — M79675 Pain in left toe(s): Secondary | ICD-10-CM

## 2023-09-12 DIAGNOSIS — N1832 Chronic kidney disease, stage 3b: Secondary | ICD-10-CM

## 2023-09-12 DIAGNOSIS — B351 Tinea unguium: Secondary | ICD-10-CM | POA: Diagnosis not present

## 2023-09-12 DIAGNOSIS — I5022 Chronic systolic (congestive) heart failure: Secondary | ICD-10-CM

## 2023-09-12 DIAGNOSIS — Z794 Long term (current) use of insulin: Secondary | ICD-10-CM

## 2023-09-12 MED ORDER — POTASSIUM CHLORIDE CRYS ER 20 MEQ PO TBCR: 40.0000 meq | EXTENDED_RELEASE_TABLET | Freq: Every day | ORAL | Status: DC

## 2023-09-12 NOTE — Telephone Encounter (Addendum)
Message reviewed with patient. Pt verbalized understanding and agreement. He states he was just recently told to stop potassium, but he still has potassium Rx at home and denied needing refill. BMET order placed. Pt will return for labwork on 11/14.  ----- Message from Nurse Emer C sent at 09/08/2023  4:19 PM EDT -----  ----- Message ----- From: Laurey Morale, MD Sent: 09/08/2023   2:47 PM EDT To: [facility] Triage Pool  Low K, increase KCl to 40 mEq daily. BMET 10 days.

## 2023-09-12 NOTE — Progress Notes (Unsigned)
  Subjective:  Patient ID: [name], male    DOB: Jan 11, 1935,  MRN: [id number]  88 y.o. male presents with {jgcomplaint:23593} No chief complaint on file.    PCP: Dorris Carnes, MD.  New problem(s): None. {jgcomplaint:23593}  Review of Systems: Negative except as noted in the HPI.   Allergies  Allergen Reactions   Angiotensin Receptor Blockers     hyperkalemia   Spironolactone     Hyperkalemia Pt states he is not allergic   Metformin Diarrhea    Objective:  There were no vitals filed for this visit. Constitutional Patient is a pleasant 88 y.o. {Race/ethnicity:17218} male {jgbodyhabitus:24098} AAO x 3.  Vascular Capillary fill time to digits <3 seconds.  DP/PT pulse(s) are faintly palpable b/l lower extremities. Pedal hair absent b/l. Lower extremity skin temperature gradient warm to cool b/l. No pain with calf compression b/l. No cyanosis or clubbing noted. No ischemia nor gangrene noted b/l. {jgvascular:23595}  Neurologic Protective sensation intact 5/5 intact bilaterally with 10g monofilament b/l. Vibratory sensation intact b/l. No clonus b/l. {jgneuro:23601::"Protective sensation intact 5/5 intact bilaterally with 10g monofilament b/l.","Vibratory sensation intact b/l.","Proprioception intact bilaterally."}  Dermatologic Pedal skin is thin, shiny and atrophic b/l.  No open wounds b/l lower extremities. No interdigital macerations b/l lower extremities. Toenails 1-5 b/l elongated, discolored, dystrophic, thickened, crumbly with subungual debris and tenderness to dorsal palpation. {jgderm:23598}  Orthopedic: Normal muscle strength 5/5 to all lower extremity muscle groups bilaterally. {jgmsk:23600}   Last HgA1c:     Latest Ref Rng & Units 06/03/2023    5:44 AM 12/15/2022    2:09 PM  Hemoglobin A1C  Hemoglobin-A1c 4.8 - 5.6 % 7.0  7.9      Assessment:   1. Pain due to onychomycosis of toenails of both feet   2. Type 2 diabetes mellitus with stage 3b chronic kidney  disease, with long-term current use of insulin (HCC)    Plan:  Patient was evaluated and treated and all questions answered. Consent given for treatment as described below: {jgplan:23602::"-Patient/POA to call should there be question/concern in the interim."}  Return in about 3 months (around 12/13/2023).  Freddie Breech, DPM

## 2023-09-22 ENCOUNTER — Other Ambulatory Visit
Admission: RE | Admit: 2023-09-22 | Discharge: 2023-09-22 | Disposition: A | Discharge status: Home / Self Care | Payer: Medicare HMO | Attending: Cardiology | Admitting: Cardiology

## 2023-09-22 DIAGNOSIS — I5022 Chronic systolic (congestive) heart failure: Secondary | ICD-10-CM | POA: Diagnosis present

## 2023-09-22 LAB — BASIC METABOLIC PANEL
Anion gap: 8 (ref 5–15)
BUN: 34 mg/dL — ABNORMAL HIGH (ref 8–23)
CO2: 24 mmol/L (ref 22–32)
Calcium: 7.5 mg/dL — ABNORMAL LOW (ref 8.9–10.3)
Chloride: 107 mmol/L (ref 98–111)
Creatinine, Ser: 1.7 mg/dL — ABNORMAL HIGH (ref 0.61–1.24)
GFR, Estimated: 39 mL/min — ABNORMAL LOW (ref >60)
Glucose, Bld: 220 mg/dL — ABNORMAL HIGH (ref 70–99)
Potassium: 3.1 mmol/L — ABNORMAL LOW (ref 3.5–5.1)
Sodium: 139 mmol/L (ref 135–145)

## 2023-09-23 ENCOUNTER — Telehealth: Payer: Self-pay

## 2023-09-23 DIAGNOSIS — I5022 Chronic systolic (congestive) heart failure: Secondary | ICD-10-CM

## 2023-09-23 MED ORDER — POTASSIUM CHLORIDE CRYS ER 20 MEQ PO TBCR: 60.0000 meq | EXTENDED_RELEASE_TABLET | Freq: Every day | ORAL | 5 refills | Status: DC

## 2023-09-23 NOTE — Telephone Encounter (Signed)
-----   Message from [employer] sent at 09/22/2023  1:32 PM EST ----- If he is taking KCl 40 daily, increase to 40 qam/20 qpm. BMET 1 week.
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
## 2023-09-30 ENCOUNTER — Other Ambulatory Visit
Admission: RE | Admit: 2023-09-30 | Discharge: 2023-09-30 | Disposition: A | Discharge status: Home / Self Care | Payer: Medicare HMO | Attending: Cardiology | Admitting: Cardiology

## 2023-09-30 DIAGNOSIS — I5022 Chronic systolic (congestive) heart failure: Secondary | ICD-10-CM | POA: Insufficient documentation

## 2023-09-30 LAB — BASIC METABOLIC PANEL
Anion gap: 10 (ref 5–15)
BUN: 39 mg/dL — ABNORMAL HIGH (ref 8–23)
CO2: 22 mmol/L (ref 22–32)
Calcium: 7.8 mg/dL — ABNORMAL LOW (ref 8.9–10.3)
Chloride: 107 mmol/L (ref 98–111)
Creatinine, Ser: 1.89 mg/dL — ABNORMAL HIGH (ref 0.61–1.24)
GFR, Estimated: 34 mL/min — ABNORMAL LOW (ref >60)
Glucose, Bld: 182 mg/dL — ABNORMAL HIGH (ref 70–99)
Potassium: 4.4 mmol/L (ref 3.5–5.1)
Sodium: 139 mmol/L (ref 135–145)

## 2023-10-04 ENCOUNTER — Encounter: Payer: Medicare HMO | Admitting: Cardiology

## 2023-10-04 ENCOUNTER — Other Ambulatory Visit: Payer: Medicare HMO

## 2023-10-05 ENCOUNTER — Telehealth: Payer: Self-pay | Admitting: Family

## 2023-10-05 NOTE — Telephone Encounter (Signed)
Pt confirmed appt 10/10/23

## 2023-10-10 ENCOUNTER — Ambulatory Visit (HOSPITAL_BASED_OUTPATIENT_CLINIC_OR_DEPARTMENT_OTHER): Payer: Medicare HMO | Admitting: Family

## 2023-10-10 ENCOUNTER — Other Ambulatory Visit
Admission: RE | Admit: 2023-10-10 | Discharge: 2023-10-10 | Disposition: A | Discharge status: Home / Self Care | Payer: Medicare HMO | Source: Ambulatory Visit | Attending: Family | Admitting: Family

## 2023-10-10 ENCOUNTER — Encounter: Payer: Self-pay | Admitting: Family

## 2023-10-10 VITALS — BP 143/70 | HR 64 | Wt 225.0 lb

## 2023-10-10 DIAGNOSIS — E1122 Type 2 diabetes mellitus with diabetic chronic kidney disease: Secondary | ICD-10-CM

## 2023-10-10 DIAGNOSIS — I2510 Atherosclerotic heart disease of native coronary artery without angina pectoris: Secondary | ICD-10-CM | POA: Diagnosis not present

## 2023-10-10 DIAGNOSIS — I890 Lymphedema, not elsewhere classified: Secondary | ICD-10-CM

## 2023-10-10 DIAGNOSIS — N1832 Chronic kidney disease, stage 3b: Secondary | ICD-10-CM

## 2023-10-10 DIAGNOSIS — I5022 Chronic systolic (congestive) heart failure: Secondary | ICD-10-CM | POA: Insufficient documentation

## 2023-10-10 DIAGNOSIS — I10 Essential (primary) hypertension: Secondary | ICD-10-CM

## 2023-10-10 DIAGNOSIS — Z794 Long term (current) use of insulin: Secondary | ICD-10-CM

## 2023-10-10 DIAGNOSIS — I480 Paroxysmal atrial fibrillation: Secondary | ICD-10-CM

## 2023-10-10 LAB — BASIC METABOLIC PANEL
Anion gap: 8 (ref 5–15)
BUN: 37 mg/dL — ABNORMAL HIGH (ref 8–23)
CO2: 21 mmol/L — ABNORMAL LOW (ref 22–32)
Calcium: 8 mg/dL — ABNORMAL LOW (ref 8.9–10.3)
Chloride: 112 mmol/L — ABNORMAL HIGH (ref 98–111)
Creatinine, Ser: 1.62 mg/dL — ABNORMAL HIGH (ref 0.61–1.24)
GFR, Estimated: 41 mL/min — ABNORMAL LOW (ref >60)
Glucose, Bld: 174 mg/dL — ABNORMAL HIGH (ref 70–99)
Potassium: 4 mmol/L (ref 3.5–5.1)
Sodium: 141 mmol/L (ref 135–145)
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
## 2023-10-11 ENCOUNTER — Telehealth: Payer: Self-pay

## 2023-10-11 DIAGNOSIS — I5022 Chronic systolic (congestive) heart failure: Secondary | ICD-10-CM

## 2023-10-11 LAB — MISC LABCORP TEST (SEND OUT): Labcorp test code: 143000

## 2023-10-11 NOTE — Telephone Encounter (Signed)
-----   Message from Delma Freeze sent at 10/11/2023  8:22 AM EST ----- Kidney function improving. Pro BNP indicates you're retaining fluid. Increase torsemide to 40mg  every day and change potassium to AM & PM.   BMET and proBNP in 2 weeks.

## 2023-10-28 ENCOUNTER — Other Ambulatory Visit
Admission: RE | Admit: 2023-10-28 | Discharge: 2023-10-28 | Disposition: A | Discharge status: Home / Self Care | Payer: Medicare HMO | Attending: Family | Admitting: Family

## 2023-10-28 DIAGNOSIS — I5022 Chronic systolic (congestive) heart failure: Secondary | ICD-10-CM | POA: Insufficient documentation

## 2023-10-28 LAB — BASIC METABOLIC PANEL
Anion gap: 8 (ref 5–15)
BUN: 26 mg/dL — ABNORMAL HIGH (ref 8–23)
CO2: 22 mmol/L (ref 22–32)
Calcium: 7.9 mg/dL — ABNORMAL LOW (ref 8.9–10.3)
Chloride: 105 mmol/L (ref 98–111)
Creatinine, Ser: 1.51 mg/dL — ABNORMAL HIGH (ref 0.61–1.24)
GFR, Estimated: 45 mL/min — ABNORMAL LOW (ref >60)
Glucose, Bld: 151 mg/dL — ABNORMAL HIGH (ref 70–99)
Potassium: 4 mmol/L (ref 3.5–5.1)
Sodium: 135 mmol/L (ref 135–145)

## 2023-10-29 LAB — MISC LABCORP TEST (SEND OUT): Labcorp test code: 143000

## 2023-11-10 ENCOUNTER — Telehealth: Payer: Self-pay | Admitting: Family

## 2023-11-10 ENCOUNTER — Encounter: Payer: Medicare HMO | Admitting: Family

## 2023-11-10 NOTE — Telephone Encounter (Signed)
 Patient did not show for his [facility] Failure [facility] appointment on 11/10/23.
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
## 2023-11-17 ENCOUNTER — Other Ambulatory Visit: Payer: Self-pay | Admitting: Nurse Practitioner

## 2023-11-17 DIAGNOSIS — I2510 Atherosclerotic heart disease of native coronary artery without angina pectoris: Secondary | ICD-10-CM

## 2023-12-13 ENCOUNTER — Telehealth: Payer: Self-pay | Admitting: Cardiology

## 2023-12-13 NOTE — Telephone Encounter (Signed)
Lvm to confirm appt for 12/14/23

## 2023-12-14 ENCOUNTER — Ambulatory Visit (HOSPITAL_BASED_OUTPATIENT_CLINIC_OR_DEPARTMENT_OTHER): Payer: Medicare HMO | Admitting: Cardiology

## 2023-12-14 ENCOUNTER — Other Ambulatory Visit
Admission: RE | Admit: 2023-12-14 | Discharge: 2023-12-14 | Disposition: A | Discharge status: Home / Self Care | Payer: Medicare HMO | Source: Ambulatory Visit | Attending: Cardiology | Admitting: Cardiology

## 2023-12-14 VITALS — BP 137/84 | HR 64 | Wt 230.0 lb

## 2023-12-14 DIAGNOSIS — Z79899 Other long term (current) drug therapy: Secondary | ICD-10-CM | POA: Insufficient documentation

## 2023-12-14 DIAGNOSIS — Z7901 Long term (current) use of anticoagulants: Secondary | ICD-10-CM | POA: Insufficient documentation

## 2023-12-14 DIAGNOSIS — I480 Paroxysmal atrial fibrillation: Secondary | ICD-10-CM | POA: Insufficient documentation

## 2023-12-14 DIAGNOSIS — I255 Ischemic cardiomyopathy: Secondary | ICD-10-CM | POA: Diagnosis not present

## 2023-12-14 DIAGNOSIS — Z951 Presence of aortocoronary bypass graft: Secondary | ICD-10-CM | POA: Diagnosis not present

## 2023-12-14 DIAGNOSIS — R0602 Shortness of breath: Secondary | ICD-10-CM | POA: Insufficient documentation

## 2023-12-14 DIAGNOSIS — I2510 Atherosclerotic heart disease of native coronary artery without angina pectoris: Secondary | ICD-10-CM | POA: Insufficient documentation

## 2023-12-14 DIAGNOSIS — I252 Old myocardial infarction: Secondary | ICD-10-CM | POA: Diagnosis not present

## 2023-12-14 DIAGNOSIS — I5022 Chronic systolic (congestive) heart failure: Secondary | ICD-10-CM | POA: Insufficient documentation

## 2023-12-14 DIAGNOSIS — E1122 Type 2 diabetes mellitus with diabetic chronic kidney disease: Secondary | ICD-10-CM | POA: Insufficient documentation

## 2023-12-14 DIAGNOSIS — Z794 Long term (current) use of insulin: Secondary | ICD-10-CM | POA: Insufficient documentation

## 2023-12-14 DIAGNOSIS — I130 Hypertensive heart and chronic kidney disease with heart failure and stage 1 through stage 4 chronic kidney disease, or unspecified chronic kidney disease: Secondary | ICD-10-CM | POA: Diagnosis not present

## 2023-12-14 DIAGNOSIS — N183 Chronic kidney disease, stage 3 unspecified: Secondary | ICD-10-CM | POA: Diagnosis not present

## 2023-12-14 DIAGNOSIS — Z7984 Long term (current) use of oral hypoglycemic drugs: Secondary | ICD-10-CM | POA: Diagnosis not present

## 2023-12-14 DIAGNOSIS — Z87891 Personal history of nicotine dependence: Secondary | ICD-10-CM | POA: Diagnosis not present

## 2023-12-14 LAB — LIPID PANEL
Cholesterol: 132 mg/dL (ref 0–200)
HDL: 53 mg/dL (ref >40)
LDL Cholesterol: 66 mg/dL (ref 0–99)
Total CHOL/HDL Ratio: 2.5 {ratio}
Triglycerides: 64 mg/dL (ref <150)
VLDL: 13 mg/dL (ref 0–40)

## 2023-12-14 LAB — BASIC METABOLIC PANEL
Anion gap: 9 (ref 5–15)
BUN: 35 mg/dL — ABNORMAL HIGH (ref 8–23)
CO2: 23 mmol/L (ref 22–32)
Calcium: 7.8 mg/dL — ABNORMAL LOW (ref 8.9–10.3)
Chloride: 107 mmol/L (ref 98–111)
Creatinine, Ser: 1.99 mg/dL — ABNORMAL HIGH (ref 0.61–1.24)
GFR, Estimated: 32 mL/min — ABNORMAL LOW (ref >60)
Glucose, Bld: 142 mg/dL — ABNORMAL HIGH (ref 70–99)
Potassium: 3.3 mmol/L — ABNORMAL LOW (ref 3.5–5.1)
Sodium: 139 mmol/L (ref 135–145)

## 2023-12-14 LAB — BRAIN NATRIURETIC PEPTIDE: B Natriuretic Peptide: 3946.6 pg/mL — ABNORMAL HIGH (ref 0.0–100.0)

## 2023-12-14 MED ORDER — TORSEMIDE 20 MG PO TABS: 40.0000 mg | ORAL_TABLET | Freq: Every day | ORAL | 3 refills | Status: DC

## 2023-12-14 MED ORDER — METOLAZONE 2.5 MG PO TABS: ORAL_TABLET | ORAL | 6 refills | Status: DC

## 2023-12-14 MED ORDER — SPIRONOLACTONE 25 MG PO TABS: 25.0000 mg | ORAL_TABLET | Freq: Every day | ORAL | 3 refills | Status: DC
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
## 2023-12-14 NOTE — Patient Instructions (Signed)
 INCREASE Torsemide  to 40 mg daily.  INCREASE Spironolactone  to 25 mg ( 1 tab) daily.  Go DOWN to LOWER LEVEL (LL) to have your blood work completed inside of [employer] office.  Go over to the MEDICAL MALL. Go pass the gift shop and have your blood work completed.  We will only call you if the results are abnormal or if the provider would like to make medication changes.  Your physician has requested that you have an echocardiogram. Echocardiography is a painless test that uses sound waves to create images of your heart. It provides your doctor with information about the size and shape of your heart and how well your heart's chambers and valves are working. This procedure takes approximately one hour. There are no restrictions for this procedure. Please do NOT wear cologne, perfume, aftershave, or lotions (deodorant is allowed). Please arrive 15 minutes prior to your appointment time.  Please note: We ask at that you not bring children with you during ultrasound (echo/ vascular) testing. Due to room size and safety concerns, children are not allowed in the ultrasound rooms during exams. Our front office staff cannot provide observation of children in our lobby area while testing is being conducted. An adult accompanying a patient to their appointment will only be allowed in the ultrasound room at the discretion of the ultrasound technician under special circumstances. We apologize for any inconvenience.  YOU WILL BE CALLED TO HAVE THIS TEST ARRANGED.  Your physician recommends that you schedule a follow-up appointment in: 1 month.  If you have any questions or concerns before your next appointment please send us  a message through [place] or call our office at [phone].    TO LEAVE A MESSAGE FOR THE NURSE SELECT OPTION 2, PLEASE LEAVE A MESSAGE INCLUDING: YOUR NAME DATE OF BIRTH CALL BACK NUMBER REASON FOR CALL**this is important as we prioritize the call backs  YOU WILL RECEIVE A CALL  BACK THE SAME DAY AS LONG AS YOU CALL BEFORE 4:00 PM  At the [facility], you and your health needs are our priority. As part of our continuing mission to provide you with exceptional heart care, we have created designated Provider Care Teams. These Care Teams include your primary Cardiologist (physician) and Advanced Practice Providers (APPs- Physician Assistants and Nurse Practitioners) who all work together to provide you with the care you need, when you need it.   You may see any of the following providers on your designated Care Team at your next follow up: Dr Toribio Fuel Dr Ezra Shuck Dr. Ria Commander Dr. Morene Brownie Amy Lenetta, NP Caffie Shed, [place] [facility] [place], [place] Beckey Coe, NP Ellouise Class, NP Jaun Pili, PharmD   Please be sure to bring in all your medications bottles to every appointment.    Thank you for choosing [facility]

## 2023-12-15 ENCOUNTER — Encounter: Payer: Self-pay | Admitting: Podiatry

## 2023-12-15 ENCOUNTER — Ambulatory Visit: Payer: Medicare HMO | Admitting: Podiatry

## 2023-12-15 VITALS — Ht 68.0 in | Wt 230.0 lb

## 2023-12-15 DIAGNOSIS — M79675 Pain in left toe(s): Secondary | ICD-10-CM

## 2023-12-15 DIAGNOSIS — B351 Tinea unguium: Secondary | ICD-10-CM | POA: Diagnosis not present

## 2023-12-15 DIAGNOSIS — E1122 Type 2 diabetes mellitus with diabetic chronic kidney disease: Secondary | ICD-10-CM | POA: Diagnosis not present

## 2023-12-15 DIAGNOSIS — N1832 Chronic kidney disease, stage 3b: Secondary | ICD-10-CM | POA: Diagnosis not present

## 2023-12-15 DIAGNOSIS — M79674 Pain in right toe(s): Secondary | ICD-10-CM | POA: Diagnosis not present

## 2023-12-15 DIAGNOSIS — Z794 Long term (current) use of insulin: Secondary | ICD-10-CM

## 2023-12-22 ENCOUNTER — Inpatient Hospital Stay
Admission: EM | Admit: 2023-12-22 | Discharge: 2023-12-27 | DRG: 291 | Disposition: A | Discharge status: Home Health | Payer: Medicare HMO | Attending: Hospitalist | Admitting: Hospitalist

## 2023-12-22 ENCOUNTER — Other Ambulatory Visit: Payer: Self-pay

## 2023-12-22 ENCOUNTER — Emergency Department: Payer: Medicare HMO

## 2023-12-22 ENCOUNTER — Other Ambulatory Visit: Payer: Self-pay | Admitting: Family

## 2023-12-22 DIAGNOSIS — N1832 Chronic kidney disease, stage 3b: Secondary | ICD-10-CM | POA: Diagnosis present

## 2023-12-22 DIAGNOSIS — I5023 Acute on chronic systolic (congestive) heart failure: Secondary | ICD-10-CM | POA: Diagnosis present

## 2023-12-22 DIAGNOSIS — E1122 Type 2 diabetes mellitus with diabetic chronic kidney disease: Secondary | ICD-10-CM | POA: Diagnosis present

## 2023-12-22 DIAGNOSIS — Z8249 Family history of ischemic heart disease and other diseases of the circulatory system: Secondary | ICD-10-CM

## 2023-12-22 DIAGNOSIS — Z955 Presence of coronary angioplasty implant and graft: Secondary | ICD-10-CM | POA: Diagnosis not present

## 2023-12-22 DIAGNOSIS — Z79899 Other long term (current) drug therapy: Secondary | ICD-10-CM | POA: Diagnosis not present

## 2023-12-22 DIAGNOSIS — J1282 Pneumonia due to coronavirus disease 2019: Secondary | ICD-10-CM | POA: Diagnosis present

## 2023-12-22 DIAGNOSIS — Z87891 Personal history of nicotine dependence: Secondary | ICD-10-CM

## 2023-12-22 DIAGNOSIS — I252 Old myocardial infarction: Secondary | ICD-10-CM

## 2023-12-22 DIAGNOSIS — I509 Heart failure, unspecified: Secondary | ICD-10-CM

## 2023-12-22 DIAGNOSIS — I442 Atrioventricular block, complete: Secondary | ICD-10-CM | POA: Diagnosis present

## 2023-12-22 DIAGNOSIS — I480 Paroxysmal atrial fibrillation: Secondary | ICD-10-CM | POA: Diagnosis present

## 2023-12-22 DIAGNOSIS — U071 COVID-19: Secondary | ICD-10-CM | POA: Diagnosis present

## 2023-12-22 DIAGNOSIS — I5022 Chronic systolic (congestive) heart failure: Secondary | ICD-10-CM

## 2023-12-22 DIAGNOSIS — Z888 Allergy status to other drugs, medicaments and biological substances status: Secondary | ICD-10-CM

## 2023-12-22 DIAGNOSIS — Z950 Presence of cardiac pacemaker: Secondary | ICD-10-CM | POA: Diagnosis present

## 2023-12-22 DIAGNOSIS — Z794 Long term (current) use of insulin: Secondary | ICD-10-CM | POA: Diagnosis not present

## 2023-12-22 DIAGNOSIS — I130 Hypertensive heart and chronic kidney disease with heart failure and stage 1 through stage 4 chronic kidney disease, or unspecified chronic kidney disease: Principal | ICD-10-CM | POA: Diagnosis present

## 2023-12-22 DIAGNOSIS — E876 Hypokalemia: Secondary | ICD-10-CM | POA: Diagnosis not present

## 2023-12-22 DIAGNOSIS — I2510 Atherosclerotic heart disease of native coronary artery without angina pectoris: Secondary | ICD-10-CM | POA: Diagnosis present

## 2023-12-22 DIAGNOSIS — N184 Chronic kidney disease, stage 4 (severe): Secondary | ICD-10-CM | POA: Diagnosis present

## 2023-12-22 DIAGNOSIS — Z7984 Long term (current) use of oral hypoglycemic drugs: Secondary | ICD-10-CM | POA: Diagnosis not present

## 2023-12-22 DIAGNOSIS — Z8674 Personal history of sudden cardiac arrest: Secondary | ICD-10-CM

## 2023-12-22 DIAGNOSIS — E785 Hyperlipidemia, unspecified: Secondary | ICD-10-CM | POA: Diagnosis present

## 2023-12-22 DIAGNOSIS — Z8679 Personal history of other diseases of the circulatory system: Secondary | ICD-10-CM

## 2023-12-22 DIAGNOSIS — Z7901 Long term (current) use of anticoagulants: Secondary | ICD-10-CM

## 2023-12-22 DIAGNOSIS — Z951 Presence of aortocoronary bypass graft: Secondary | ICD-10-CM | POA: Diagnosis not present

## 2023-12-22 DIAGNOSIS — N179 Acute kidney failure, unspecified: Secondary | ICD-10-CM | POA: Diagnosis not present

## 2023-12-22 DIAGNOSIS — I2489 Other forms of acute ischemic heart disease: Secondary | ICD-10-CM | POA: Diagnosis present

## 2023-12-22 DIAGNOSIS — R0602 Shortness of breath: Secondary | ICD-10-CM | POA: Diagnosis present

## 2023-12-22 DIAGNOSIS — I5031 Acute diastolic (congestive) heart failure: Secondary | ICD-10-CM | POA: Diagnosis not present

## 2023-12-22 DIAGNOSIS — I255 Ischemic cardiomyopathy: Secondary | ICD-10-CM | POA: Diagnosis present

## 2023-12-22 LAB — CBC
HCT: 46.9 % (ref 39.0–52.0)
Hemoglobin: 14.7 g/dL (ref 13.0–17.0)
MCH: 27.3 pg (ref 26.0–34.0)
MCHC: 31.3 g/dL (ref 30.0–36.0)
MCV: 87 fL (ref 80.0–100.0)
Platelets: 110 10*3/uL — ABNORMAL LOW (ref 150–400)
RBC: 5.39 MIL/uL (ref 4.22–5.81)
RDW: 14.6 % (ref 11.5–15.5)
WBC: 4.8 10*3/uL (ref 4.0–10.5)
nRBC: 0 % (ref 0.0–0.2)

## 2023-12-22 LAB — BASIC METABOLIC PANEL
Anion gap: 9 (ref 5–15)
BUN: 38 mg/dL — ABNORMAL HIGH (ref 8–23)
CO2: 27 mmol/L (ref 22–32)
Calcium: 8 mg/dL — ABNORMAL LOW (ref 8.9–10.3)
Chloride: 104 mmol/L (ref 98–111)
Creatinine, Ser: 2.14 mg/dL — ABNORMAL HIGH (ref 0.61–1.24)
GFR, Estimated: 29 mL/min — ABNORMAL LOW (ref >60)
Glucose, Bld: 120 mg/dL — ABNORMAL HIGH (ref 70–99)
Potassium: 3.7 mmol/L (ref 3.5–5.1)
Sodium: 140 mmol/L (ref 135–145)

## 2023-12-22 LAB — TROPONIN I (HIGH SENSITIVITY)
Troponin I (High Sensitivity): 36 ng/L — ABNORMAL HIGH (ref <18)
Troponin I (High Sensitivity): 39 ng/L — ABNORMAL HIGH (ref <18)

## 2023-12-22 LAB — RESP PANEL BY RT-PCR (RSV, FLU A&B, COVID)  RVPGX2
Influenza A by PCR: NEGATIVE
Influenza B by PCR: NEGATIVE
Resp Syncytial Virus by PCR: NEGATIVE
SARS Coronavirus 2 by RT PCR: POSITIVE — AB

## 2023-12-22 LAB — PROTIME-INR
INR: 1.1 (ref 0.8–1.2)
Prothrombin Time: 14.1 s (ref 11.4–15.2)

## 2023-12-22 LAB — BRAIN NATRIURETIC PEPTIDE: B Natriuretic Peptide: 3093.2 pg/mL — ABNORMAL HIGH (ref 0.0–100.0)

## 2023-12-22 MED ORDER — FUROSEMIDE 10 MG/ML IJ SOLN
60.0000 mg | Freq: Once | INTRAMUSCULAR | Status: AC
  Administered 2023-12-22: 60 mg via INTRAVENOUS
  Filled 2023-12-22: qty 8

## 2023-12-22 MED ORDER — IPRATROPIUM-ALBUTEROL 0.5-2.5 (3) MG/3ML IN SOLN
3.0000 mL | Freq: Once | RESPIRATORY_TRACT | Status: AC
  Administered 2023-12-22: 3 mL via RESPIRATORY_TRACT
  Filled 2023-12-22: qty 3

## 2023-12-22 NOTE — ED Provider Triage Note (Signed)
Emergency Medicine Provider Triage Evaluation Note  [name] , a 89 y.o. male  was evaluated in triage.  Pt complains of chest pain and SOB x 3-4 days. Hx of CAD.   Review of Systems  Positive:  Negative:   Physical Exam  There were no vitals taken for this visit. Gen:   Awake, no distress   Resp:  Increased effort. Right lung reveals expiratory coarse wheezing.  MSK:   Moves extremities without difficulty  Other:  RRR  Medical Decision Making  Medically screening exam initiated at 3:51 PM.  Appropriate orders placed.  [name] was informed that the remainder of the evaluation will be completed by another provider, this initial triage assessment does not replace that evaluation, and the importance of remaining in the ED until their evaluation is complete.     Romeo Apple, Frank Pilger A, PA-C 12/22/23 1555

## 2023-12-22 NOTE — ED Provider Notes (Signed)
[facility] Provider Note    Event Date/Time   First MD Initiated Contact with Patient 12/22/23 1948     (approximate)   History   Shortness of breath  HPI  [name] is a 89 y.o. male who presents to the emergency department today with primary concerns for shortness of breath.  Patient states that he has not felt well for about a week.  However symptoms started getting worse yesterday.  Says been accompanied by cough.  Has had chest tightness.  Located primarily in the lower chest.  Denies any extremity edema.  No fevers.  No known sick contacts.     Physical Exam   Triage Vital Signs: ED Triage Vitals  Encounter Vitals Group     BP 12/22/23 1553 138/87     Systolic BP Percentile --      Diastolic BP Percentile --      Pulse Rate 12/22/23 1553 65     Resp 12/22/23 1553 (!) 22     Temp 12/22/23 1553 98.8 F (37.1 C)     Temp Source 12/22/23 1553 Oral     SpO2 12/22/23 1552 93 %     Weight 12/22/23 1553 230 lb (104.3 kg)     Height 12/22/23 1553 5\' 8"  (1.727 m)     Head Circumference --      Peak Flow --      Pain Score 12/22/23 1553 5     Pain Loc --      Pain Education --      Exclude from Growth Chart --     Most recent vital signs: Vitals:   12/22/23 1552 12/22/23 1553  BP:  138/87  Pulse:  65  Resp:  (!) 22  Temp:  98.8 F (37.1 C)  SpO2: 93% 93%   General: Awake, alert, oriented. CV:  Good peripheral perfusion. Regular rate and rhythm. Resp:  Slightly increased work of breathing. Abd:  No distention.    ED Results / Procedures / Treatments   Labs (all labs ordered are listed, but only abnormal results are displayed) Labs Reviewed  RESP PANEL BY RT-PCR (RSV, FLU A&B, COVID)  RVPGX2 - Abnormal; Notable for the following components:      Result Value   SARS Coronavirus 2 by RT PCR POSITIVE (*)    All other components within normal limits  BASIC METABOLIC PANEL - Abnormal; Notable for the following components:    Glucose, Bld 120 (*)    BUN 38 (*)    Creatinine, Ser 2.14 (*)    Calcium 8.0 (*)    GFR, Estimated 29 (*)    All other components within normal limits  CBC - Abnormal; Notable for the following components:   Platelets 110 (*)    All other components within normal limits  TROPONIN I (HIGH SENSITIVITY) - Abnormal; Notable for the following components:   Troponin I (High Sensitivity) 36 (*)    All other components within normal limits  PROTIME-INR  TROPONIN I (HIGH SENSITIVITY)     EKG  I, Phineas Semen, attending physician, personally viewed and interpreted this EKG  EKG Time: 1541 Rate: 73 Rhythm: ventricular paced rhythm Axis: normal Intervals: qtc 456 QRS: wide ST changes: no st elevation Impression: abnormal ekg   RADIOLOGY I independently interpreted and visualized the CXR. My interpretation: No pneumonia Radiology interpretation:  IMPRESSION:  Cardiomegaly with vascular congestion. Question early interstitial  edema.      PROCEDURES:  Critical Care performed: No  MEDICATIONS ORDERED IN ED: Medications - No data to display   IMPRESSION / MDM / ASSESSMENT AND PLAN / ED COURSE  I reviewed the triage vital signs and the nursing notes.                              Differential diagnosis includes, but is not limited to, pneumonia, CHF, viral illness  Patient's presentation is most consistent with acute presentation with potential threat to life or bodily function.   The patient is on the cardiac monitor to evaluate for evidence of arrhythmia and/or significant heart rate changes.  Patient presented to the emergency department today because of concerns for shortness of breath.  On exam patient does have increased work of breathing.  Patient with history of heart failure.  History curative concern for possible edema.  BNP was elevated.  Additionally patient did test positive for COVID.  Will start Lasix.  I do think patient would benefit from further  workup and evaluation.  Discussed with Dr. Para March with the hospitalist service who will evaluate the patient for admission.      FINAL CLINICAL IMPRESSION(S) / ED DIAGNOSES   Final diagnoses:  Shortness of breath  COVID-19  Congestive heart failure, unspecified HF chronicity, unspecified heart failure type [facility])      Note:  This document was prepared using Dragon voice recognition software and may include unintentional dictation errors.    Phineas Semen, MD 12/22/23 [phone]

## 2023-12-22 NOTE — ED Triage Notes (Signed)
Pt c/o SOB, chest tightness,and congested cough x1 days.  Pain score 5/10.  Pt denies sick contacts.

## 2023-12-22 NOTE — ED Notes (Signed)
First Nurse Note: Pt to ED via [facility] from home for ShOB. Pt has normal work of breathing. Pt has productive cough and some chest tightness. Pt has pacemaker.  HR- 70-90 BP- 150/99 SpO2-97% CBG 148

## 2023-12-23 ENCOUNTER — Encounter: Payer: Self-pay | Admitting: Podiatry

## 2023-12-23 DIAGNOSIS — I5023 Acute on chronic systolic (congestive) heart failure: Secondary | ICD-10-CM

## 2023-12-23 DIAGNOSIS — U071 COVID-19: Secondary | ICD-10-CM

## 2023-12-23 DIAGNOSIS — I509 Heart failure, unspecified: Secondary | ICD-10-CM

## 2023-12-23 HISTORY — DX: COVID-19: U07.1

## 2023-12-23 LAB — HEMOGLOBIN A1C
Hgb A1c MFr Bld: 7.5 % — ABNORMAL HIGH (ref 4.8–5.6)
Mean Plasma Glucose: 168.55 mg/dL

## 2023-12-23 LAB — CBG MONITORING, ED
Glucose-Capillary: 93 mg/dL (ref 70–99)
Glucose-Capillary: 112 mg/dL — ABNORMAL HIGH (ref 70–99)
Glucose-Capillary: 158 mg/dL — ABNORMAL HIGH (ref 70–99)
Glucose-Capillary: 146 mg/dL — ABNORMAL HIGH (ref 70–99)
Glucose-Capillary: 94 mg/dL (ref 70–99)

## 2023-12-23 MED ORDER — DAPAGLIFLOZIN PROPANEDIOL 10 MG PO TABS: 10.0000 mg | ORAL_TABLET | Freq: Every day | ORAL | Status: DC

## 2023-12-23 MED ORDER — INSULIN GLARGINE-YFGN 100 UNIT/ML ~~LOC~~ SOLN
15.0000 [IU] | Freq: Every day | SUBCUTANEOUS | Status: DC
  Administered 2023-12-23 – 2023-12-26 (×4): 15 [IU] via SUBCUTANEOUS
  Filled 2023-12-23 (×5): qty 0.15

## 2023-12-23 MED ORDER — INSULIN ASPART 100 UNIT/ML IJ SOLN
0.0000 [IU] | Freq: Every day | INTRAMUSCULAR | Status: DC
  Administered 2023-12-24: 3 [IU] via SUBCUTANEOUS
  Filled 2023-12-23 (×2): qty 1

## 2023-12-23 MED ORDER — ONDANSETRON HCL 4 MG/2ML IJ SOLN: 4.0000 mg | Freq: Four times a day (QID) | INTRAMUSCULAR | Status: DC | PRN

## 2023-12-23 MED ORDER — ONDANSETRON HCL 4 MG PO TABS: 4.0000 mg | ORAL_TABLET | Freq: Four times a day (QID) | ORAL | Status: DC | PRN

## 2023-12-23 MED ORDER — HYDROCODONE-ACETAMINOPHEN 5-325 MG PO TABS
1.0000 | ORAL_TABLET | ORAL | Status: DC | PRN
  Administered 2023-12-26: 1 via ORAL
  Filled 2023-12-23: qty 1

## 2023-12-23 MED ORDER — ACETAMINOPHEN 325 MG PO TABS
650.0000 mg | ORAL_TABLET | Freq: Four times a day (QID) | ORAL | Status: DC | PRN
  Administered 2023-12-23: 650 mg via ORAL
  Filled 2023-12-23: qty 2

## 2023-12-23 MED ORDER — BUTALBITAL-APAP-CAFFEINE 50-325-40 MG PO TABS
2.0000 | ORAL_TABLET | Freq: Once | ORAL | Status: AC
  Administered 2023-12-23: 2 via ORAL
  Filled 2023-12-23: qty 2

## 2023-12-23 MED ORDER — ALBUTEROL SULFATE (2.5 MG/3ML) 0.083% IN NEBU
2.5000 mg | INHALATION_SOLUTION | Freq: Four times a day (QID) | RESPIRATORY_TRACT | Status: DC | PRN
  Administered 2023-12-25: 2.5 mg via RESPIRATORY_TRACT
  Filled 2023-12-23: qty 3

## 2023-12-23 MED ORDER — GUAIFENESIN ER 600 MG PO TB12
1200.0000 mg | ORAL_TABLET | Freq: Two times a day (BID) | ORAL | Status: DC
  Administered 2023-12-23 – 2023-12-27 (×8): 1200 mg via ORAL
  Filled 2023-12-23 (×8): qty 2

## 2023-12-23 MED ORDER — TAMSULOSIN HCL 0.4 MG PO CAPS
0.4000 mg | ORAL_CAPSULE | Freq: Every day | ORAL | Status: DC
  Administered 2023-12-23 – 2023-12-27 (×5): 0.4 mg via ORAL
  Filled 2023-12-23 (×5): qty 1

## 2023-12-23 MED ORDER — ACETAMINOPHEN 650 MG RE SUPP: 650.0000 mg | Freq: Four times a day (QID) | RECTAL | Status: DC | PRN

## 2023-12-23 MED ORDER — CARVEDILOL 3.125 MG PO TABS
6.2500 mg | ORAL_TABLET | Freq: Two times a day (BID) | ORAL | Status: DC
  Administered 2023-12-23 – 2023-12-24 (×4): 6.25 mg via ORAL
  Filled 2023-12-23: qty 1
  Filled 2023-12-23 (×2): qty 2
  Filled 2023-12-23: qty 1
  Filled 2023-12-23: qty 2

## 2023-12-23 MED ORDER — FUROSEMIDE 10 MG/ML IJ SOLN
80.0000 mg | Freq: Two times a day (BID) | INTRAMUSCULAR | Status: DC
Start: 2023-12-23 — End: 2023-12-26
  Administered 2023-12-23 – 2023-12-26 (×6): 80 mg via INTRAVENOUS
  Filled 2023-12-23 (×6): qty 8

## 2023-12-23 MED ORDER — FUROSEMIDE 10 MG/ML IJ SOLN
40.0000 mg | Freq: Two times a day (BID) | INTRAMUSCULAR | Status: DC
  Administered 2023-12-23 (×2): 40 mg via INTRAVENOUS
  Filled 2023-12-23 (×2): qty 4

## 2023-12-23 MED ORDER — POTASSIUM CHLORIDE CRYS ER 20 MEQ PO TBCR
40.0000 meq | EXTENDED_RELEASE_TABLET | Freq: Every day | ORAL | Status: DC
  Administered 2023-12-23 – 2023-12-26 (×4): 40 meq via ORAL
  Filled 2023-12-23 (×4): qty 2

## 2023-12-23 MED ORDER — HYDRALAZINE HCL 50 MG PO TABS
25.0000 mg | ORAL_TABLET | Freq: Two times a day (BID) | ORAL | Status: DC
  Administered 2023-12-23: 25 mg via ORAL
  Filled 2023-12-23: qty 1

## 2023-12-23 MED ORDER — INSULIN ASPART 100 UNIT/ML IJ SOLN
0.0000 [IU] | Freq: Three times a day (TID) | INTRAMUSCULAR | Status: DC
  Administered 2023-12-23: 3 [IU] via SUBCUTANEOUS
  Administered 2023-12-23: 2 [IU] via SUBCUTANEOUS
  Administered 2023-12-25: 8 [IU] via SUBCUTANEOUS
  Administered 2023-12-26 – 2023-12-27 (×3): 3 [IU] via SUBCUTANEOUS
  Filled 2023-12-23 (×8): qty 1

## 2023-12-23 MED ORDER — SACUBITRIL-VALSARTAN 24-26 MG PO TABS
1.0000 | ORAL_TABLET | Freq: Two times a day (BID) | ORAL | Status: DC
  Filled 2023-12-23: qty 1

## 2023-12-23 MED ORDER — ISOSORBIDE MONONITRATE ER 60 MG PO TB24
30.0000 mg | ORAL_TABLET | Freq: Every day | ORAL | Status: DC
  Administered 2023-12-23: 30 mg via ORAL
  Filled 2023-12-23: qty 1

## 2023-12-23 MED ORDER — SACUBITRIL-VALSARTAN 97-103 MG PO TABS
1.0000 | ORAL_TABLET | Freq: Two times a day (BID) | ORAL | Status: DC
  Administered 2023-12-23: 1 via ORAL
  Filled 2023-12-23 (×2): qty 1

## 2023-12-23 MED ORDER — APIXABAN 2.5 MG PO TABS
2.5000 mg | ORAL_TABLET | Freq: Two times a day (BID) | ORAL | Status: DC
Start: 2023-12-23 — End: 2023-12-27
  Administered 2023-12-23 – 2023-12-27 (×9): 2.5 mg via ORAL
  Filled 2023-12-23 (×11): qty 1

## 2023-12-23 MED ORDER — SPIRONOLACTONE 25 MG PO TABS
25.0000 mg | ORAL_TABLET | Freq: Every day | ORAL | Status: DC
Start: 2023-12-23 — End: 2023-12-27
  Administered 2023-12-23 – 2023-12-27 (×5): 25 mg via ORAL
  Filled 2023-12-23 (×5): qty 1

## 2023-12-23 MED ORDER — EZETIMIBE 10 MG PO TABS
10.0000 mg | ORAL_TABLET | Freq: Every day | ORAL | Status: DC
Start: 2023-12-23 — End: 2023-12-27
  Administered 2023-12-23 – 2023-12-27 (×5): 10 mg via ORAL
  Filled 2023-12-23 (×5): qty 1

## 2023-12-23 MED ORDER — ROSUVASTATIN CALCIUM 10 MG PO TABS
40.0000 mg | ORAL_TABLET | Freq: Every day | ORAL | Status: DC
  Administered 2023-12-23 – 2023-12-27 (×5): 40 mg via ORAL
  Filled 2023-12-23 (×2): qty 4
  Filled 2023-12-23: qty 2
  Filled 2023-12-23 (×2): qty 4

## 2023-12-23 NOTE — Assessment & Plan Note (Signed)
Continue Coreg and apixaban

## 2023-12-23 NOTE — Assessment & Plan Note (Signed)
Renal function at baseline. Monitor for worsening with IV Lasix

## 2023-12-23 NOTE — Assessment & Plan Note (Signed)
Elevated troponin Patient with mild chest discomfort Troponin slightly elevated at 39 suspecting demand ischemia Continue Imdur, Crestor, carvedilol and apixaban Follow-up echo to evaluate for segmental wall motion abnormality

## 2023-12-23 NOTE — Progress Notes (Signed)
Heart Failure Navigator Progress Note  Assessed for Heart & Vascular TOC clinic readiness.  Patient does not meet criteria due to current Advanced Heart Failure Team patient of Duane Ancona, MD.  Navigator will sign off at this time.  Roxy Horseman, RN, BSN [facility] Heart Failure Navigator Secure Chat Only

## 2023-12-23 NOTE — H&P (Signed)
History and Physical    Patient: [name] AVW:[id number] DOB: [birth year] DOA: 12/22/2023 DOS: the patient was seen and examined on 12/23/2023 PCP: Dorris Carnes, MD  Patient coming from: Home  Chief Complaint:  Chief Complaint  Patient presents with   Shortness of Breath   Chest Pain    HPI: [name] is a 89 y.o. 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  He notes that he has been feeling fatigued for about a week and had an associated cough.  He denies fever or chills.  Denies pain or lower extremity edema. ED course: Mild tachypnea to 22 but otherwise normal vitals and saturating in the mid 90s on room air. Workup notable for the following: COVID-positive Troponin 39 with BNP 3093 Creatinine at baseline at 2.14 9 EKG, personally viewed and interpreted showing ventricular paced rhythm at 73 Chest x-ray with cardiomegaly with vascular congestion, question early interstitial edema Patient treated with IV Lasix 60 mg and given a DuoNeb Hospitalist consulted for admission.     Past Medical History:  Diagnosis Date   AV block, [place] 1    CAD (coronary artery disease)    a. 08/2019 VF arrest-->sev 3vd on cath-->CABGx5 (LIMA->LAD, VG->OM->LCX, VG->RPDA, VG->Diag); b. 08/2021 Cath: sev native multivessel dzs w/ 5/5 patent grafts. Nl filling pressures->Med rx; c. 05/2023 Cath: stable anatomy->Med rx.   CKD (chronic kidney disease), stage III (HCC)    Diabetes mellitus without complication (HCC)    HFrEF (heart failure with reduced ejection fraction) (HCC)    a. 08/2020 Echo: EF 40-45%; b. 11/2020 Echo: EF 55%, Gr2 DD; c. 08/2021  Echo: EF 35-40%, glob HK w/ ant/antsept/apical HK; d. 10/2021 s/p CRT-P; e. 12/2021 Echo: EF 30-35%, glob HK, GrIII DD; f. 12/2022 Echo: EF 30-35%, glob HK, sev HK of ant wall, mod asymm LVH, nl RV fxn, mildly dil LA, triv MR, AoV sclerosis.   Hyperlipidemia LDL goal <70    Hypertension    Intermittent Complete heart block (HCC)    a. 08/2021 noted on Zio; b. 10/2021 s/p Abbott Allure RF CRT-P (Ser # [id number]).   Ischemic cardiomyopathy    a. 08/2020 Echo: EF 40-45%; b. 11/2020 Echo: EF 55%; c. 08/2021 Echo: EF 35-40%; d. 10/2021 s/p CRT-P; e. 12/2021 Echo: EF 30-35%, glob HK, GrIII DD.   PAF (paroxysmal atrial fibrillation) (HCC)    a.Medical device check-->Eliquis (CHA2DS2VASc = 6)   Trifascicular block    Past Surgical History:  Procedure Laterality Date   BACK SURGERY     CARDIAC CATHETERIZATION     CLIPPING OF ATRIAL APPENDAGE N/A 08/29/2020   Procedure: CLIPPING OF ATRIAL APPENDAGE USING ATRICURE 45 MM ATRICLIP FLEX-V;  Surgeon: Delight Ovens, MD;  Location: [facility] OR;  Service: Open Heart Surgery;  Laterality: N/A;   CORONARY ARTERY BYPASS GRAFT N/A 08/29/2020   Procedure: CORONARY ARTERY BYPASS GRAFTING (CABG) X 5 USING LEFT INTERNAL MAMMARY ARTERY AND ENDOSCOPICALLY HARVESTED RIGHT GREATER SAPHENOUS VEIN. LIMA TO LAD, SVG TO OM SEQ TO CIRC, SVG TO PD, SVG TO DIAG.;  Surgeon: Delight Ovens, MD;  Location: [facility] OR;  Service: Open Heart Surgery;  Laterality: N/A;   ENDOVEIN HARVEST OF GREATER  SAPHENOUS VEIN Right 08/29/2020   Procedure: ENDOVEIN HARVEST OF GREATER SAPHENOUS VEIN;  Surgeon: Delight Ovens, MD;  Location: [facility] OR;  Service: Open Heart Surgery;  Laterality: Right;   HERNIA REPAIR     LEFT HEART CATH AND CORONARY ANGIOGRAPHY N/A 08/25/2020   Procedure: LEFT HEART CATH AND CORONARY ANGIOGRAPHY;  Surgeon: Iran Ouch, MD;  Location: [facility] INVASIVE CV LAB;  Service: Cardiovascular;  Laterality: N/A;   PACEMAKER IMPLANT N/A 10/12/2021   Procedure: PACEMAKER IMPLANT;   Surgeon: Lanier Prude, MD;  Location: [facility] INVASIVE CV LAB;  Service: Cardiovascular;  Laterality: N/A;   RIGHT/LEFT HEART CATH AND CORONARY ANGIOGRAPHY N/A 08/31/2021   Procedure: RIGHT/LEFT HEART CATH AND CORONARY ANGIOGRAPHY;  Surgeon: Iran Ouch, MD;  Location: [facility] INVASIVE CV LAB;  Service: Cardiovascular;  Laterality: N/A;   RIGHT/LEFT HEART CATH AND CORONARY/GRAFT ANGIOGRAPHY N/A 06/06/2023   Procedure: RIGHT/LEFT HEART CATH AND CORONARY/GRAFT ANGIOGRAPHY;  Surgeon: Iran Ouch, MD;  Location: [facility] INVASIVE CV LAB;  Service: Cardiovascular;  Laterality: N/A;   TEE WITHOUT CARDIOVERSION N/A 08/29/2020   Procedure: TRANSESOPHAGEAL ECHOCARDIOGRAM (TEE);  Surgeon: Delight Ovens, MD;  Location: [facility] OR;  Service: Open Heart Surgery;  Laterality: N/A;   Social History:  reports that he quit smoking about 27 years ago. His smoking use included cigarettes. He has never used smokeless tobacco. He reports that he does not currently use alcohol. He reports that he does not use drugs.  Allergies  Allergen Reactions   Angiotensin Receptor Blockers     hyperkalemia   Spironolactone     Hyperkalemia Pt states he is not allergic   Metformin Diarrhea    Family History  Problem Relation Age of Onset   Heart attack Mother     Prior to Admission medications   Medication Sig Start Date End Date Taking? Authorizing Provider  acetaminophen (TYLENOL) 500 MG tablet Take 1,000 mg by mouth every 6 (six) hours as needed for mild pain, fever or headache.    [provider]  acyclovir (ZOVIRAX) 800 MG tablet Take 1 tablet by mouth daily. 01/21/23   [provider]  albuterol (VENTOLIN HFA) 108 (90 Base) MCG/ACT inhaler Inhale 2 puffs into the lungs every 6 (six) hours as needed for wheezing or shortness of breath. 09/30/21   [provider]  apixaban (ELIQUIS) 2.5 MG TABS tablet Take 1 tablet (2.5 mg total) by mouth 2 (two) times daily. 02/08/23   Eustace Pen,  PA-C  carvedilol (COREG) 6.25 MG tablet TAKE 1 TABLET BY MOUTH 2 TIMES DAILY WITH A MEAL. 11/17/23   Creig Hines, NP  dapagliflozin propanediol (FARXIGA) 10 MG TABS tablet Take 10 mg by mouth daily.    [provider]  Evolocumab (REPATHA) 140 MG/ML SOSY Inject into the skin.    [provider]  ezetimibe (ZETIA) 10 MG tablet Take 1 tablet (10 mg total) by mouth daily. 08/09/23 09/08/23  Delma Freeze, FNP  hydrALAZINE (APRESOLINE) 25 MG tablet Take 25 mg by mouth 2 times daily at 12 noon and 4 pm.    [provider]  insulin degludec (TRESIBA) 100 UNIT/ML FlexTouch Pen Inject 0.15 mLs into the skin at bedtime. 01/11/23 01/11/24  [provider]  isosorbide mononitrate (IMDUR) 30 MG 24 hr tablet Take 30 mg by mouth daily.    [provider]  ketoconazole (NIZORAL) 2 % cream Apply to both feet and between toes once daily for 6 weeks. 06/10/23   Geralynn Rile  L, DPM  magnesium oxide (MAG-OX) 400 MG tablet Take 1 tablet (400 mg total) by mouth daily. 01/20/22   Lanier Prude, MD  metolazone (ZAROXOLYN) 2.5 MG tablet Take one tablet every Tuesday one hour before the Torsemide. 12/14/23   Laurey Morale, MD  pantoprazole (PROTONIX) 40 MG tablet Take 40 mg by mouth daily.    [provider]  polyethylene glycol (MIRALAX / GLYCOLAX) 17 g packet Take 17 g by mouth daily as needed for moderate constipation. 12/19/22   Alford Highland, MD  potassium chloride SA (KLOR-CON M) 20 MEQ tablet Take 2 tablets (40 mEq total) by mouth daily. 09/12/23   Laurey Morale, MD  rosuvastatin (CRESTOR) 40 MG tablet Take 1 tablet (40 mg total) by mouth daily. 05/25/23   Hammock, Lavonna Rua, NP  sacubitril-valsartan (ENTRESTO) 97-103 MG Take 1 tablet by mouth 2 (two) times daily. 03/23/23   Creig Hines, NP  spironolactone (ALDACTONE) 25 MG tablet Take 1 tablet (25 mg total) by mouth daily. 12/14/23 03/13/24  Laurey Morale, MD  tamsulosin (FLOMAX) 0.4 MG  CAPS capsule Take 0.4 mg by mouth daily.    [provider]  torsemide (DEMADEX) 20 MG tablet Take 2 tablets (40 mg total) by mouth daily. 12/14/23   Laurey Morale, MD    Physical Exam: Vitals:   12/22/23 2015 12/22/23 2045 12/22/23 2100 12/22/23 2130  BP:      Pulse: 68 87 (!) 56 67  Resp:      Temp: 98.6 F (37 C)     TempSrc: Oral     SpO2: 100% 97% 96% 99%  Weight:      Height:       Physical Exam Vitals and nursing note reviewed.  Constitutional:      General: He is not in acute distress. HENT:     Head: Normocephalic and atraumatic.  Cardiovascular:     Rate and Rhythm: Normal rate and regular rhythm.     Heart sounds: Normal heart sounds.  Pulmonary:     Effort: Pulmonary effort is normal.     Breath sounds: Normal breath sounds.  Abdominal:     Palpations: Abdomen is soft.     Tenderness: There is no abdominal tenderness.  Musculoskeletal:     Right lower leg: Edema present.     Left lower leg: Edema present.  Neurological:     Mental Status: Mental status is at baseline.     Labs on Admission: I have personally reviewed following labs and imaging studies  CBC: Recent Labs  Lab 12/22/23 1555  WBC 4.8  HGB 14.7  HCT 46.9  MCV 87.0  PLT 110*   Basic Metabolic Panel: Recent Labs  Lab 12/22/23 1555  NA 140  K 3.7  CL 104  CO2 27  GLUCOSE 120*  BUN 38*  CREATININE 2.14*  CALCIUM 8.0*   GFR: Estimated Creatinine Clearance: 29 mL/min (A) (by C-G formula based on SCr of 2.14 mg/dL (H)). Liver Function Tests: No results for input(s): "AST", "ALT", "ALKPHOS", "BILITOT", "PROT", "ALBUMIN" in the last 168 hours. No results for input(s): "LIPASE", "AMYLASE" in the last 168 hours. No results for input(s): "AMMONIA" in the last 168 hours. Coagulation Profile: Recent Labs  Lab 12/22/23 1555  INR 1.1   Cardiac Enzymes: No results for input(s): "CKTOTAL", "CKMB", "CKMBINDEX", "TROPONINI" in the last 168 hours. BNP (last 3 results) Recent  Labs    08/09/23 1058  PROBNP 6,649*   HbA1C: No results for  input(s): "HGBA1C" in the last 72 hours. CBG: No results for input(s): "GLUCAP" in the last 168 hours. Lipid Profile: No results for input(s): "CHOL", "HDL", "LDLCALC", "TRIG", "CHOLHDL", "LDLDIRECT" in the last 72 hours. Thyroid Function Tests: No results for input(s): "TSH", "T4TOTAL", "FREET4", "T3FREE", "THYROIDAB" in the last 72 hours. Anemia Panel: No results for input(s): "VITAMINB12", "FOLATE", "FERRITIN", "TIBC", "IRON", "RETICCTPCT" in the last 72 hours. Urine analysis:    Component Value Date/Time   COLORURINE YELLOW 09/01/2020 2035   APPEARANCEUR HAZY (A) 09/01/2020 2035   LABSPEC 1.010 09/01/2020 2035   PHURINE 5.0 09/01/2020 2035   GLUCOSEU NEGATIVE 09/01/2020 2035   HGBUR MODERATE (A) 09/01/2020 2035   BILIRUBINUR NEGATIVE 09/01/2020 2035   KETONESUR NEGATIVE 09/01/2020 2035   PROTEINUR NEGATIVE 09/01/2020 2035   NITRITE NEGATIVE 09/01/2020 2035   LEUKOCYTESUR TRACE (A) 09/01/2020 2035    Radiological Exams on Admission: DG Chest 2 View Result Date: 12/22/2023 CLINICAL DATA:  Chest pain, shortness of breath EXAM: CHEST - 2 VIEW COMPARISON:  06/03/2023 FINDINGS: Left pacer remains in place, unchanged. Prior CABG. Cardiomegaly, vascular congestion. Irregular density projecting over the right upper lobe corresponds to the calcified subcapsular mass seen on prior CT and is unchanged. Mild perihilar interstitial prominence may reflect early interstitial edema. IMPRESSION: Cardiomegaly with vascular congestion. Question early interstitial edema. Electronically Signed   By: Charlett Nose M.D.   On: 12/22/2023 17:19     Data Reviewed: Relevant notes from primary care and specialist visits, past discharge summaries as available in EHR, including Care Everywhere. Prior diagnostic testing as pertinent to current admission diagnoses Updated medications and problem lists for reconciliation ED course, including  vitals, labs, imaging, treatment and response to treatment Triage notes, nursing and pharmacy notes and ED provider's notes Notable results as noted in HPI   Assessment and Plan: Acute on chronic systolic CHF (congestive heart failure) (HCC) IV Lasix Continue GDMT with spironolactone, Entresto, isosorbide and hydralazine and carvedilol Daily weights with intake and output monitoring Echocardiogram Cardiology consult  COVID-19 virus infection Symptomatic for weakness, fatigue and cough Symptoms present for over a week so outside window for therapeutic benefit of antivirals Airborne precautions  CAD, multiple vessel Elevated troponin Patient with mild chest discomfort Troponin slightly elevated at 39 suspecting demand ischemia Continue Imdur, Crestor, carvedilol and apixaban Follow-up echo to evaluate for segmental wall motion abnormality  Paroxysmal atrial fibrillation (HCC) Continue Coreg and apixaban  CKD stage 3b, GFR 30-44 ml/min (HCC) Renal function at baseline. Monitor for worsening with IV Lasix  History of sustained ventricular fibrillation History of complete heart block Cardiac resynchronization therapy pacemaker in place No acute issues suspected        DVT prophylaxis: Eliquis  Consults: [facility] cardiology  Advance Care Planning:   Code Status: Prior   Family Communication: none  Disposition Plan: Back to previous home environment  Severity of Illness: The appropriate patient status for this patient is INPATIENT. Inpatient status is judged to be reasonable and necessary in order to provide the required intensity of service to ensure the patient's safety. The patient's presenting symptoms, physical exam findings, and initial radiographic and laboratory data in the context of their chronic comorbidities is felt to place them at high risk for further clinical deterioration. Furthermore, it is not anticipated that the patient will be medically stable for  discharge from the hospital within 2 midnights of admission.   * I certify that at the point of admission it is my clinical judgment that the patient will  require inpatient hospital care spanning beyond 2 midnights from the point of admission due to high intensity of service, high risk for further deterioration and high frequency of surveillance required.*  Author: Andris Baumann, MD 12/23/2023 12:51 AM  For on call review [email or web address].

## 2023-12-23 NOTE — Assessment & Plan Note (Signed)
IV Lasix Continue GDMT with spironolactone, Entresto, isosorbide and hydralazine and carvedilol Daily weights with intake and output monitoring Echocardiogram Cardiology consult
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
## 2023-12-23 NOTE — Assessment & Plan Note (Addendum)
History of complete heart block Cardiac resynchronization therapy pacemaker in place No acute issues suspected

## 2023-12-23 NOTE — ED Notes (Signed)
CCMD called and cardiac monitoring verified.

## 2023-12-23 NOTE — Assessment & Plan Note (Signed)
Symptomatic for weakness, fatigue and cough Symptoms present for over a week so outside window for therapeutic benefit of antivirals Airborne precautions

## 2023-12-23 NOTE — Progress Notes (Signed)
  Subjective:  Patient ID: [name], male    DOB: 1935-06-28,  MRN: [id number]  [name] presents to clinic today for at risk foot care. Pt has h/o NIDDM with chronic kidney disease and painful, elongated thickened toenails x 10 which are symptomatic when wearing enclosed shoe gear. This interferes with his/her daily activities.  Chief Complaint  Patient presents with   Nail Problem    Pt is here for [facility] last A1C was 7 PCP is Dr Marnie and LOV was 2 months ago.   New problem(s): None.   PCP is [name], MD.  Allergies  Allergen Reactions   Angiotensin Receptor Blockers     hyperkalemia   Spironolactone      Hyperkalemia Pt states he is not allergic   Metformin Diarrhea    Review of Systems: Negative except as noted in the HPI.  Objective: No changes noted in today's physical examination. There were no vitals filed for this visit. [name] is a pleasant 89 y.o. male in NAD. AAO x 3.  Vascular Examination: CFT <3 seconds b/l. DP/PT pulses faintly palpable b/l. Skin temperature gradient warm to warm b/l. No pain with calf compression. No ischemia or gangrene. No cyanosis or clubbing noted b/l. Pedal hair absent.   Neurological Examination: Sensation grossly intact b/l with 10 gram monofilament. Vibratory sensation intact b/l.   Dermatological Examination: Pedal skin warm and supple b/l.   No open wounds. No interdigital macerations.  Toenails 1-5 b/l thick, discolored, elongated with subungual debris and pain on dorsal palpation.    No corns, calluses nor porokeratotic lesions noted.  Musculoskeletal Examination: Muscle strength 5/5 to all lower extremity muscle groups bilaterally. No pain, crepitus or joint limitation noted with ROM bilateral LE. No gross bony deformities bilaterally.  Radiographs: None  Last A1c:      Latest Ref Rng & Units 12/23/2023    2:46 AM 06/03/2023    5:44 AM  Hemoglobin A1C  Hemoglobin-A1c 4.8 - 5.6 % 7.5  7.0     Assessment/Plan: 1. Pain due to onychomycosis of toenails of both feet   2. Type 2 diabetes mellitus with stage 3b chronic kidney disease, with long-term current use of insulin  [facility])     Patient was evaluated and treated. All patient's and/or POA's questions/concerns addressed on today's visit. Toenails 1-5 debrided in length and girth without incident. Continue foot and shoe inspections daily. Monitor blood glucose per PCP/Endocrinologist's recommendations. Continue soft, supportive shoe gear daily. Report any pedal injuries to medical professional. Call office if there are any questions/concerns. -Patient/POA to call should there be question/concern in the interim.   Return in about 3 months (around 03/13/2024).  Delon LITTIE Merlin, DPM      [place] LOCATION: [address], [place] [address]                   Office [phone]   [facility] LOCATION: [address] [place], [place] [address] Office [phone]

## 2023-12-24 ENCOUNTER — Inpatient Hospital Stay (HOSPITAL_COMMUNITY)
Admit: 2023-12-24 | Discharge: 2023-12-24 | Disposition: A | Discharge status: Home / Self Care | Payer: Medicare HMO | Attending: Internal Medicine | Admitting: Internal Medicine

## 2023-12-24 DIAGNOSIS — I509 Heart failure, unspecified: Secondary | ICD-10-CM

## 2023-12-24 DIAGNOSIS — R0602 Shortness of breath: Principal | ICD-10-CM

## 2023-12-24 DIAGNOSIS — I5031 Acute diastolic (congestive) heart failure: Secondary | ICD-10-CM

## 2023-12-24 DIAGNOSIS — I480 Paroxysmal atrial fibrillation: Secondary | ICD-10-CM

## 2023-12-24 DIAGNOSIS — I2510 Atherosclerotic heart disease of native coronary artery without angina pectoris: Secondary | ICD-10-CM

## 2023-12-24 DIAGNOSIS — I5023 Acute on chronic systolic (congestive) heart failure: Secondary | ICD-10-CM | POA: Diagnosis not present

## 2023-12-24 LAB — GLUCOSE, CAPILLARY
Glucose-Capillary: 113 mg/dL — ABNORMAL HIGH (ref 70–99)
Glucose-Capillary: 195 mg/dL — ABNORMAL HIGH (ref 70–99)
Glucose-Capillary: 109 mg/dL — ABNORMAL HIGH (ref 70–99)
Glucose-Capillary: 266 mg/dL — ABNORMAL HIGH (ref 70–99)

## 2023-12-24 LAB — BASIC METABOLIC PANEL
Anion gap: 9 (ref 5–15)
BUN: 32 mg/dL — ABNORMAL HIGH (ref 8–23)
CO2: 26 mmol/L (ref 22–32)
Calcium: 7.8 mg/dL — ABNORMAL LOW (ref 8.9–10.3)
Chloride: 104 mmol/L (ref 98–111)
Creatinine, Ser: 1.77 mg/dL — ABNORMAL HIGH (ref 0.61–1.24)
GFR, Estimated: 37 mL/min — ABNORMAL LOW (ref >60)
Glucose, Bld: 106 mg/dL — ABNORMAL HIGH (ref 70–99)
Potassium: 3.2 mmol/L — ABNORMAL LOW (ref 3.5–5.1)
Sodium: 139 mmol/L (ref 135–145)

## 2023-12-24 LAB — CBC
HCT: 46 % (ref 39.0–52.0)
Hemoglobin: 14.4 g/dL (ref 13.0–17.0)
MCH: 27.1 pg (ref 26.0–34.0)
MCHC: 31.3 g/dL (ref 30.0–36.0)
MCV: 86.5 fL (ref 80.0–100.0)
Platelets: 108 10*3/uL — ABNORMAL LOW (ref 150–400)
RBC: 5.32 MIL/uL (ref 4.22–5.81)
RDW: 14.6 % (ref 11.5–15.5)
WBC: 4.8 10*3/uL (ref 4.0–10.5)
nRBC: 0 % (ref 0.0–0.2)

## 2023-12-24 LAB — MAGNESIUM: Magnesium: 1.7 mg/dL (ref 1.7–2.4)

## 2023-12-24 MED ORDER — ISOSORB DINITRATE-HYDRALAZINE 20-37.5 MG PO TABS
0.5000 | ORAL_TABLET | Freq: Three times a day (TID) | ORAL | Status: DC
  Administered 2023-12-24 – 2023-12-26 (×6): 0.5 via ORAL
  Filled 2023-12-24 (×7): qty 0.5

## 2023-12-24 MED ORDER — POTASSIUM CHLORIDE CRYS ER 20 MEQ PO TBCR
40.0000 meq | EXTENDED_RELEASE_TABLET | Freq: Once | ORAL | Status: AC
Start: 2023-12-24 — End: 2023-12-24
  Administered 2023-12-24: 40 meq via ORAL
  Filled 2023-12-24: qty 2

## 2023-12-24 MED ORDER — SACUBITRIL-VALSARTAN 24-26 MG PO TABS
1.0000 | ORAL_TABLET | Freq: Two times a day (BID) | ORAL | Status: DC
  Administered 2023-12-24 – 2023-12-27 (×7): 1 via ORAL
  Filled 2023-12-24 (×7): qty 1

## 2023-12-24 MED ORDER — PERFLUTREN LIPID MICROSPHERE
1.0000 mL | INTRAVENOUS | Status: AC | PRN
  Administered 2023-12-24: 5 mL via INTRAVENOUS

## 2023-12-24 NOTE — Plan of Care (Signed)
   Problem: Education: Goal: Knowledge of risk factors and measures for prevention of condition will improve Outcome: Progressing   Problem: Coping: Goal: Psychosocial and spiritual needs will be supported Outcome: Progressing   Problem: Respiratory: Goal: Will maintain a patent airway Outcome: Progressing Goal: Complications related to the disease process, condition or treatment will be avoided or minimized Outcome: Progressing

## 2023-12-24 NOTE — Progress Notes (Signed)
Rounding Note    Patient Name: [name] Date of Encounter: 12/24/2023  [facility] Cardiologist: Lorine Bears, MD   Subjective   Presenting to the ED this admission cough, fatigue, leg swelling COVID-positive, chest x-ray mild edema, BNP 3000 Started on IV Lasix Reports breathing is improving Lungs still " tight", now developing loose cough Left leg swelling  Inpatient Medications    Scheduled Meds:  apixaban  2.5 mg Oral BID   carvedilol  6.25 mg Oral BID WC   ezetimibe  10 mg Oral Daily   furosemide  80 mg Intravenous BID   guaiFENesin  1,200 mg Oral BID   insulin aspart  0-15 Units Subcutaneous TID WC   insulin aspart  0-5 Units Subcutaneous QHS   insulin glargine-yfgn  15 Units Subcutaneous Q2200   isosorbide-hydrALAZINE  0.5 tablet Oral TID   potassium chloride SA  40 mEq Oral Daily   rosuvastatin  40 mg Oral Daily   sacubitril-valsartan  1 tablet Oral BID   spironolactone  25 mg Oral Daily   tamsulosin  0.4 mg Oral Daily   Continuous Infusions:  PRN Meds: acetaminophen **OR** acetaminophen, albuterol, HYDROcodone-acetaminophen, ondansetron **OR** ondansetron (ZOFRAN) IV   Vital Signs    Vitals:   12/24/23 0000 12/24/23 0054 12/24/23 0500 12/24/23 0827  BP: (!) 149/94 (!) 145/94  133/80  Pulse: 64 65  64  Resp: 18 20  16   Temp:  98.4 F (36.9 C)  98.2 F (36.8 C)  TempSrc:    Oral  SpO2: 97% 100%  97%  Weight:   101.3 kg   Height:        Intake/Output Summary (Last 24 hours) at 12/24/2023 1322 Last data filed at 12/24/2023 0924 Gross per 24 hour  Intake --  Output 1300 ml  Net -1300 ml      12/24/2023    5:00 AM 12/22/2023    3:53 PM 12/15/2023   10:13 AM  Last 3 Weights  Weight (lbs) 223 lb 5.2 oz 230 lb 230 lb  Weight (kg) 101.3 kg 104.327 kg 104.327 kg      Telemetry    Normal sinus rhythm- Personally Reviewed  ECG     - Personally Reviewed  Physical Exam   GEN: No acute distress.   Neck:  JVD 10+ Cardiac: RRR,  no murmurs, rubs, or gallops.  Respiratory: Clear to auscultation bilaterally. GI: Soft, nontender, non-distended  MS: Trace bilateral lower extremity edema; SCDS in place no deformity. Neuro:  Nonfocal  Psych: Normal affect   Labs    High Sensitivity Troponin:   Recent Labs  Lab 12/22/23 1555 12/22/23 2020  TROPONINIHS 36* 39*     Chemistry Recent Labs  Lab 12/22/23 1555 12/24/23 0540  NA 140 139  K 3.7 3.2*  CL 104 104  CO2 27 26  GLUCOSE 120* 106*  BUN 38* 32*  CREATININE 2.14* 1.77*  CALCIUM 8.0* 7.8*  MG  --  1.7  GFRNONAA 29* 37*  ANIONGAP 9 9    Lipids No results for input(s): "CHOL", "TRIG", "HDL", "LABVLDL", "LDLCALC", "CHOLHDL" in the last 168 hours.  Hematology Recent Labs  Lab 12/22/23 1555 12/24/23 0540  WBC 4.8 4.8  RBC 5.39 5.32  HGB 14.7 14.4  HCT 46.9 46.0  MCV 87.0 86.5  MCH 27.3 27.1  MCHC 31.3 31.3  RDW 14.6 14.6  PLT 110* 108*   Thyroid No results for input(s): "TSH", "FREET4" in the last 168 hours.  BNP Recent Labs  Lab 12/22/23 1555  BNP 3,093.2*    DDimer No results for input(s): "DDIMER" in the last 168 hours.   Radiology    DG Chest 2 View Result Date: 12/22/2023 CLINICAL DATA:  Chest pain, shortness of breath EXAM: CHEST - 2 VIEW COMPARISON:  06/03/2023 FINDINGS: Left pacer remains in place, unchanged. Prior CABG. Cardiomegaly, vascular congestion. Irregular density projecting over the right upper lobe corresponds to the calcified subcapsular mass seen on prior CT and is unchanged. Mild perihilar interstitial prominence may reflect early interstitial edema. IMPRESSION: Cardiomegaly with vascular congestion. Question early interstitial edema. Electronically Signed   By: Charlett Nose M.D.   On: 12/22/2023 17:19    Cardiac Studies  Repeat echo pending   Patient Profile     [name] is an 89 y.o. with history of CAD s/p CABG, chronic systolic CHF/ischemic cardiomyopathy, paroxysmal atrial fibrillation, and CKD stage 3  presenting with shortness of breath, COVID, leg swelling  Assessment & Plan    1. Acute on chronic systolic CHF:  -History of ischemic cardiomyopathy, CABG  Abbott CRT-P device in place.   Last echo in 2/24 with EF 30-35%, moderate LVH.,  Repeat pending -Continue Coreg 6.25 twice daily, BiDil (hydralazine and Imdur combo) Entresto 97/103 twice daily, Farxiga 10 daily, spironolactone 25 daily -Plan to continue IV Lasix twice daily -3 L this admission   2. Acute hypoxic respiratory failure -  likely combination of COVID PNA and HF -Still very wheezy on exam, would recommend neb treatments every 6 hours   3. CAD: s/p CABG.   Cath in 7/24 with patent grafts with stable moderate stenosis in SVG-PDA managed medically.    - He is on Eliquis in place of aspirin, Crestor No plan for ischemic workup at this time, repeat echo pending  4. Atrial fibrillation: Paroxysmal.   - NSR today with AV pacing on monitor Continue reduced dose Eliquis 2.5 mg bid, dosed lower for age > 80 and creatinine > 1.5.  -Hemoglobin stable 14   5.  Aki on CKD stage 3b:  - baseline Scr 1.6-1.9. Likely cardiorenal -Peak creatinine 2.1 now 1.77 this morning with diuresis   6. Hypokalemia - supp, 2 doses potassium today   For questions or updates, please contact [facility] Please consult [email or web address] for contact info under        Signed, Julien Nordmann, MD  12/24/2023, 1:22 PM

## 2023-12-24 NOTE — Progress Notes (Signed)
  Echocardiogram 2D Echocardiogram has been performed. Definity IV ultrasound imaging agent used on this study.  [name] 12/24/2023, 4:10 PM

## 2023-12-24 NOTE — Progress Notes (Signed)
  PROGRESS NOTE    [name]  ZOX:[id number] DOB: 25-Dec-1934 DOA: 12/22/2023 PCP: Dorris Carnes, MD  160A/160A-AA  LOS: 2 days   Brief hospital course:   Assessment & Plan: [name] is a 89 y.o. 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  He notes that he has been feeling fatigued for about a week and had an associated cough.     Acute on chronic systolic CHF (congestive heart failure) (HCC) --Last echo in 2/24 with EF 30-35%, moderate LVH.  -Continue Coreg 6.25 twice daily, BiDil (hydralazine and Imdur combo) Entresto 97/103 twice daily, Farxiga 10 daily, spironolactone 25 daily --cont IV Lasix 80 BID  COVID-19 Symptomatic for weakness, fatigue and cough Symptoms present for over a week so outside window for therapeutic benefit of antivirals Airborne precautions  CAD, multiple vessel, s/p CABG Elevated troponin Patient with mild chest discomfort Troponin slightly elevated at 39 suspecting demand ischemia Continue Imdur, Crestor, carvedilol and apixaban Follow-up echo to evaluate for segmental wall motion abnormality  Paroxysmal atrial fibrillation (HCC) Continue Coreg and apixaban  AKI CKD stage 3b, GFR 30-44 ml/min (HCC) --Cr 2.14 on presentation.  Improved to 1.77 after diuresis  --monitor Cr while diuesing  History of sustained ventricular fibrillation History of complete heart block Cardiac resynchronization therapy pacemaker in place No acute issues suspected  Hypokalemia --monitor and supplement PRN  Acute hypoxic respiratory failure ruled out --no documented hypoxia   DVT prophylaxis: [id number] Code  Status: Full code  Family Communication:  Level of care: Med-Surg Dispo:   The patient is from: home Anticipated d/c is to: home Anticipated d/c date is: 2-3 days   Subjective and Interval History:  Pt reported feeling better.  Reported good urine output.   Objective: Vitals:   12/24/23 0054 12/24/23 0500 12/24/23 0827 12/24/23 1559  BP: (!) 145/94  133/80 123/73  Pulse: 65  64 64  Resp: 20  16 16   Temp: 98.4 F (36.9 C)  98.2 F (36.8 C) 98.4 F (36.9 C)  TempSrc:   Oral Oral  SpO2: 100%  97% 98%  Weight:  101.3 kg    Height:        Intake/Output Summary (Last 24 hours) at 12/24/2023 1739 Last data filed at 12/24/2023 1607 Gross per 24 hour  Intake --  Output 1800 ml  Net -1800 ml   Filed Weights   12/22/23 1553 12/24/23 0500  Weight: 104.3 kg 101.3 kg    Examination:   Constitutional: NAD, AAOx3 CV: No cyanosis.   RESP: normal respiratory effort, on RA Neuro: II - XII grossly intact.   Psych: Normal mood and affect.  Appropriate judgement and reason   Data Reviewed: I have personally reviewed labs and imaging studies  Time spent: 50 minutes  [name], MD [facility] If 7PM-7AM, please contact night-coverage 12/24/2023, 5:39 PM

## 2023-12-25 DIAGNOSIS — I2510 Atherosclerotic heart disease of native coronary artery without angina pectoris: Secondary | ICD-10-CM | POA: Diagnosis not present

## 2023-12-25 DIAGNOSIS — I509 Heart failure, unspecified: Secondary | ICD-10-CM | POA: Diagnosis not present

## 2023-12-25 DIAGNOSIS — I480 Paroxysmal atrial fibrillation: Secondary | ICD-10-CM | POA: Diagnosis not present

## 2023-12-25 DIAGNOSIS — E876 Hypokalemia: Secondary | ICD-10-CM

## 2023-12-25 DIAGNOSIS — I5023 Acute on chronic systolic (congestive) heart failure: Secondary | ICD-10-CM | POA: Diagnosis not present

## 2023-12-25 LAB — ECHOCARDIOGRAM COMPLETE
AR max vel: 1.57 cm2
AV Peak grad: 4.6 mm[Hg]
Ao pk vel: 1.07 m/s
Area-P 1/2: 3.33 cm2
Height: 68 in
S' Lateral: 4.9 cm
Single Plane A4C EF: 24.2 %
Weight: 3573.22 [oz_av]

## 2023-12-25 LAB — CBC
HCT: 43.9 % (ref 39.0–52.0)
Hemoglobin: 14.2 g/dL (ref 13.0–17.0)
MCH: 27.4 pg (ref 26.0–34.0)
MCHC: 32.3 g/dL (ref 30.0–36.0)
MCV: 84.7 fL (ref 80.0–100.0)
Platelets: 126 10*3/uL — ABNORMAL LOW (ref 150–400)
RBC: 5.18 MIL/uL (ref 4.22–5.81)
RDW: 14.5 % (ref 11.5–15.5)
WBC: 7.1 10*3/uL (ref 4.0–10.5)
nRBC: 0 % (ref 0.0–0.2)

## 2023-12-25 LAB — BASIC METABOLIC PANEL
Anion gap: 11 (ref 5–15)
BUN: 29 mg/dL — ABNORMAL HIGH (ref 8–23)
CO2: 26 mmol/L (ref 22–32)
Calcium: 7.7 mg/dL — ABNORMAL LOW (ref 8.9–10.3)
Chloride: 103 mmol/L (ref 98–111)
Creatinine, Ser: 1.73 mg/dL — ABNORMAL HIGH (ref 0.61–1.24)
GFR, Estimated: 38 mL/min — ABNORMAL LOW (ref >60)
Glucose, Bld: 77 mg/dL (ref 70–99)
Potassium: 3.5 mmol/L (ref 3.5–5.1)
Sodium: 140 mmol/L (ref 135–145)

## 2023-12-25 LAB — GLUCOSE, CAPILLARY
Glucose-Capillary: 87 mg/dL (ref 70–99)
Glucose-Capillary: 245 mg/dL — ABNORMAL HIGH (ref 70–99)
Glucose-Capillary: 83 mg/dL (ref 70–99)
Glucose-Capillary: 110 mg/dL — ABNORMAL HIGH (ref 70–99)

## 2023-12-25 LAB — MAGNESIUM: Magnesium: 1.8 mg/dL (ref 1.7–2.4)

## 2023-12-25 MED ORDER — POTASSIUM CHLORIDE CRYS ER 20 MEQ PO TBCR
40.0000 meq | EXTENDED_RELEASE_TABLET | Freq: Once | ORAL | Status: AC
  Administered 2023-12-25: 40 meq via ORAL
  Filled 2023-12-25: qty 2

## 2023-12-25 MED ORDER — CARVEDILOL 3.125 MG PO TABS
6.2500 mg | ORAL_TABLET | Freq: Two times a day (BID) | ORAL | Status: DC
  Administered 2023-12-25 – 2023-12-27 (×5): 6.25 mg via ORAL
  Filled 2023-12-25 (×5): qty 2

## 2023-12-25 MED ORDER — SODIUM CHLORIDE 3 % IN NEBU
4.0000 mL | INHALATION_SOLUTION | Freq: Two times a day (BID) | RESPIRATORY_TRACT | Status: DC
  Administered 2023-12-25 – 2023-12-27 (×4): 4 mL via RESPIRATORY_TRACT
  Filled 2023-12-25 (×5): qty 4

## 2023-12-25 MED ORDER — IPRATROPIUM-ALBUTEROL 0.5-2.5 (3) MG/3ML IN SOLN
3.0000 mL | Freq: Two times a day (BID) | RESPIRATORY_TRACT | Status: DC
  Administered 2023-12-25 – 2023-12-27 (×5): 3 mL via RESPIRATORY_TRACT
  Filled 2023-12-25 (×5): qty 3

## 2023-12-25 NOTE — Plan of Care (Signed)
  Problem: Education: Goal: Knowledge of risk factors and measures for prevention of condition will improve Outcome: Progressing   Problem: Coping: Goal: Psychosocial and spiritual needs will be supported Outcome: Progressing   Problem: Respiratory: Goal: Will maintain a patent airway Outcome: Progressing Goal: Complications related to the disease process, condition or treatment will be avoided or minimized Outcome: Progressing   Problem: Education: Goal: Ability to describe self-care measures that may prevent or decrease complications (Diabetes Survival Skills Education) will improve Outcome: Progressing Goal: Individualized Educational Video(s) Outcome: Progressing   Problem: Coping: Goal: Ability to adjust to condition or change in health will improve Outcome: Progressing   Problem: Fluid Volume: Goal: Ability to maintain a balanced intake and output will improve Outcome: Progressing   Problem: Health Behavior/Discharge Planning: Goal: Ability to identify and utilize available resources and services will improve Outcome: Progressing Goal: Ability to manage health-related needs will improve Outcome: Progressing   Problem: Metabolic: Goal: Ability to maintain appropriate glucose levels will improve Outcome: Progressing   Problem: Nutritional: Goal: Maintenance of adequate nutrition will improve Outcome: Progressing Goal: Progress toward achieving an optimal weight will improve Outcome: Progressing   Problem: Skin Integrity: Goal: Risk for impaired skin integrity will decrease Outcome: Progressing   Problem: Tissue Perfusion: Goal: Adequacy of tissue perfusion will improve Outcome: Progressing   Problem: Education: Goal: Knowledge of General Education information will improve Description: Including pain rating scale, medication(s)/side effects and non-pharmacologic comfort measures Outcome: Progressing   Problem: Health Behavior/Discharge Planning: Goal:  Ability to manage health-related needs will improve Outcome: Progressing   Problem: Clinical Measurements: Goal: Ability to maintain clinical measurements within normal limits will improve Outcome: Progressing Goal: Will remain free from infection Outcome: Progressing Goal: Diagnostic test results will improve Outcome: Progressing Goal: Respiratory complications will improve Outcome: Progressing Goal: Cardiovascular complication will be avoided Outcome: Progressing   Problem: Activity: Goal: Risk for activity intolerance will decrease Outcome: Progressing   Problem: Nutrition: Goal: Adequate nutrition will be maintained Outcome: Progressing   Problem: Coping: Goal: Level of anxiety will decrease Outcome: Progressing   Problem: Elimination: Goal: Will not experience complications related to bowel motility Outcome: Progressing Goal: Will not experience complications related to urinary retention Outcome: Progressing   Problem: Pain Managment: Goal: General experience of comfort will improve and/or be controlled Outcome: Progressing   Problem: Safety: Goal: Ability to remain free from injury will improve Outcome: Progressing   Problem: Skin Integrity: Goal: Risk for impaired skin integrity will decrease Outcome: Progressing   Problem: Education: Goal: Ability to demonstrate management of disease process will improve Outcome: Progressing Goal: Ability to verbalize understanding of medication therapies will improve Outcome: Progressing Goal: Individualized Educational Video(s) Outcome: Progressing   Problem: Activity: Goal: Capacity to carry out activities will improve Outcome: Progressing   Problem: Cardiac: Goal: Ability to achieve and maintain adequate cardiopulmonary perfusion will improve Outcome: Progressing

## 2023-12-25 NOTE — Progress Notes (Signed)
  PROGRESS NOTE    [name]  GNF:[id number] DOB: [birth year] DOA: 12/22/2023 PCP: Dorris Carnes, MD  160A/160A-AA  LOS: 3 days   Brief hospital course:   Assessment & Plan: [name] is a 89 y.o. 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  He notes that he has been feeling fatigued for about a week and had an associated cough.     Acute on chronic systolic CHF (congestive heart failure) (HCC) --Last echo in 2/24 with EF 30-35%, moderate LVH.  Current Echo LVEF 25-30%. -Continue Coreg 6.25 twice daily, BiDil (hydralazine and Imdur combo) Entresto 97/103 twice daily, Farxiga 10 daily, spironolactone 25 daily --cont IV lasix 80 BID  COVID-19 Symptomatic for weakness, fatigue and cough Symptoms present for over a week so outside window for therapeutic benefit of antivirals Airborne precautions --hypertonic saline neb and DuoNeb BID  CAD, multiple vessel, s/p CABG Elevated troponin Patient with mild chest discomfort Troponin slightly elevated at 39 suspecting demand ischemia Continue Imdur, Crestor, carvedilol and apixaban Follow-up echo to evaluate for segmental wall motion abnormality  Paroxysmal atrial fibrillation (HCC) --cont coreg and Eliquis  AKI CKD stage 3b, GFR 30-44 ml/min (HCC) --Cr 2.14 on presentation.  Improved to 1.77 after diuresis.  Likely cardiorenal syndrome.  --monitor Cr while diuesing  History of sustained ventricular fibrillation History of complete heart block Cardiac resynchronization therapy pacemaker in place No acute issues suspected  Hypokalemia --monitor and supplement PRN  Acute hypoxic  respiratory failure ruled out --no documented hypoxia   DVT prophylaxis: QI:ONGEXBM Code Status: Full code  Family Communication:  Level of care: Med-Surg Dispo:   The patient is from: home Anticipated d/c is to: home Anticipated d/c date is: 2-3 days   Subjective and Interval History:  Pt complained of sputum stuck in his throat and couldn't bring it up.   Objective: Vitals:   12/24/23 1559 12/24/23 2107 12/25/23 0802 12/25/23 1541  BP: 123/73 124/63 (!) 114/58 116/66  Pulse: 64 68 60 (!) 53  Resp: 16 20 19 20   Temp: 98.4 F (36.9 C) 98.1 F (36.7 C) 97.9 F (36.6 C) 99.4 F (37.4 C)  TempSrc: Oral Oral    SpO2: 98% 96% 96% 97%  Weight:      Height:        Intake/Output Summary (Last 24 hours) at 12/25/2023 1759 Last data filed at 12/25/2023 1544 Gross per 24 hour  Intake 220 ml  Output 2000 ml  Net -1780 ml   Filed Weights   12/22/23 1553 12/24/23 0500  Weight: 104.3 kg 101.3 kg    Examination:   Constitutional: NAD, AAOx3 HEENT: conjunctivae and lids normal, EOMI CV: No cyanosis.   RESP: normal respiratory effort, on RA Neuro: II - XII grossly intact.   Psych: Normal mood and affect.  Appropriate judgement and reason   Data Reviewed: I have personally reviewed labs and imaging studies  Time spent: 35 minutes  [name], MD [facility] If 7PM-7AM, please contact night-coverage 12/25/2023, 5:59 PM

## 2023-12-25 NOTE — Plan of Care (Signed)
  Problem: Respiratory: Goal: Will maintain a patent airway Outcome: Progressing Goal: Complications related to the disease process, condition or treatment will be avoided or minimized Outcome: Progressing   Problem: Coping: Goal: Ability to adjust to condition or change in health will improve Outcome: Progressing   Problem: Fluid Volume: Goal: Ability to maintain a balanced intake and output will improve Outcome: Progressing

## 2023-12-25 NOTE — Progress Notes (Signed)
Rounding Note    Patient Name: [name] Date of Encounter: 12/25/2023  [facility] Cardiologist: Lorine Bears, MD   Subjective   Presenting to the ED this admission cough, fatigue, leg swelling COVID-positive, chest x-ray mild edema, BNP 3000 Started on IV Lasix  On rounds today lungs remain tight, significant chest congestion, " if I could only break it up" Denies leg swelling or abdominal distention Echo yesterday reviewed with further decline in ejection fraction 25 to 30% RV dysfunction, mild to moderately elevated right heart pressures  Renal function continues to improve with diuresis  Inpatient Medications    Scheduled Meds:  apixaban  2.5 mg Oral BID   carvedilol  6.25 mg Oral BID WC   ezetimibe  10 mg Oral Daily   furosemide  80 mg Intravenous BID   guaiFENesin  1,200 mg Oral BID   insulin aspart  0-15 Units Subcutaneous TID WC   insulin aspart  0-5 Units Subcutaneous QHS   insulin glargine-yfgn  15 Units Subcutaneous Q2200   ipratropium-albuterol  3 mL Nebulization BID   isosorbide-hydrALAZINE  0.5 tablet Oral TID   potassium chloride SA  40 mEq Oral Daily   rosuvastatin  40 mg Oral Daily   sacubitril-valsartan  1 tablet Oral BID   sodium chloride HYPERTONIC  4 mL Nebulization BID   spironolactone  25 mg Oral Daily   tamsulosin  0.4 mg Oral Daily   Continuous Infusions:  PRN Meds: acetaminophen **OR** acetaminophen, albuterol, HYDROcodone-acetaminophen, ondansetron **OR** ondansetron (ZOFRAN) IV   Vital Signs    Vitals:   12/24/23 0827 12/24/23 1559 12/24/23 2107 12/25/23 0802  BP: 133/80 123/73 124/63 (!) 114/58  Pulse: 64 64 68 60  Resp: 16 16 20 19   Temp: 98.2 F (36.8 C) 98.4 F (36.9 C) 98.1 F (36.7 C) 97.9 F (36.6 C)  TempSrc: Oral Oral Oral   SpO2: 97% 98% 96% 96%  Weight:      Height:        Intake/Output Summary (Last 24 hours) at 12/25/2023 1405 Last data filed at 12/25/2023 0945 Gross per 24 hour  Intake 100 ml   Output 1950 ml  Net -1850 ml      12/24/2023    5:00 AM 12/22/2023    3:53 PM 12/15/2023   10:13 AM  Last 3 Weights  Weight (lbs) 223 lb 5.2 oz 230 lb 230 lb  Weight (kg) 101.3 kg 104.327 kg 104.327 kg      Telemetry    Normal sinus rhythm- Personally Reviewed  ECG     - Personally Reviewed  Physical Exam   GEN: No acute distress.   Neck:  JVD 10+ Cardiac: RRR, no murmurs, rubs, or gallops.  Respiratory: Clear to auscultation bilaterally. GI: Soft, nontender, non-distended  MS: Trace bilateral lower extremity edema; SCDS in place no deformity. Neuro:  Nonfocal  Psych: Normal affect   Labs    High Sensitivity Troponin:   Recent Labs  Lab 12/22/23 1555 12/22/23 2020  TROPONINIHS 36* 39*     Chemistry Recent Labs  Lab 12/22/23 1555 12/24/23 0540 12/25/23 0424  NA 140 139 140  K 3.7 3.2* 3.5  CL 104 104 103  CO2 27 26 26   GLUCOSE 120* 106* 77  BUN 38* 32* 29*  CREATININE 2.14* 1.77* 1.73*  CALCIUM 8.0* 7.8* 7.7*  MG  --  1.7 1.8  GFRNONAA 29* 37* 38*  ANIONGAP 9 9 11     Lipids No results for input(s): "CHOL", "TRIG", "  HDL", "LABVLDL", "LDLCALC", "CHOLHDL" in the last 168 hours.  Hematology Recent Labs  Lab 12/22/23 1555 12/24/23 0540 12/25/23 0424  WBC 4.8 4.8 7.1  RBC 5.39 5.32 5.18  HGB 14.7 14.4 14.2  HCT 46.9 46.0 43.9  MCV 87.0 86.5 84.7  MCH 27.3 27.1 27.4  MCHC 31.3 31.3 32.3  RDW 14.6 14.6 14.5  PLT 110* 108* 126*   Thyroid No results for input(s): "TSH", "FREET4" in the last 168 hours.  BNP Recent Labs  Lab 12/22/23 1555  BNP 3,093.2*    DDimer No results for input(s): "DDIMER" in the last 168 hours.   Radiology    ECHOCARDIOGRAM COMPLETE Result Date: 12/25/2023    ECHOCARDIOGRAM REPORT   Patient Name:   [name] Date of Exam: 12/24/2023 Medical Rec #:  [id number]    Height:       68.0 in Accession #:    [id number]   Weight:       223.3 lb Date of Birth:  [birth year]   BSA:          2.142 m Patient Age:    88 years     BP:            133/80 mmHg Patient Gender: M            HR:           64 bpm. Exam Location:  [facility] Procedure: 2D Echo and Intracardiac Opacification Agent (Both Spectral and Color            Flow Doppler were utilized during procedure). Indications:     CHF I50.31  History:         Patient has prior history of Echocardiogram examinations, most                  recent 12/16/2022.  Sonographer:     Overton Mam RDCS, FASE Referring Phys:  [id number] Andris Baumann Diagnosing Phys: Julien Nordmann MD  Sonographer Comments: Technically difficult study due to poor echo windows. IMPRESSIONS  1. Left ventricular ejection fraction, by estimation, is 25 to 30%. Left ventricular ejection fraction by PLAX is 27 %. The left ventricle has severely decreased function. The left ventricle has no regional wall motion abnormalities. Left ventricular diastolic parameters are indeterminate.  2. Right ventricular systolic function is moderately reduced. The right ventricular size is mildly enlarged. There is mildly elevated pulmonary artery systolic pressure. The estimated right ventricular systolic pressure is 41.5 mmHg.  3. The mitral valve is normal in structure. Mild mitral valve regurgitation. No evidence of mitral stenosis.  4. The aortic valve is normal in structure. There is mild calcification of the aortic valve. Aortic valve regurgitation is not visualized. Aortic valve sclerosis/calcification is present, without any evidence of aortic stenosis.  5. The inferior vena cava is normal in size with greater than 50% respiratory variability, suggesting right atrial pressure of 3 mmHg. FINDINGS  Left Ventricle: Left ventricular ejection fraction, by estimation, is 25 to 30%. Left ventricular ejection fraction by PLAX is 27 %. The left ventricle has severely decreased function. The left ventricle has no regional wall motion abnormalities. Definity contrast agent was given IV to delineate the left ventricular endocardial borders. Strain imaging  was not performed. The left ventricular internal cavity size was normal in size. There is no left ventricular hypertrophy. Left ventricular diastolic parameters are indeterminate. Right Ventricle: The right ventricular size is mildly enlarged. No increase in right ventricular wall thickness. Right ventricular systolic function is  moderately reduced. There is mildly elevated pulmonary artery systolic pressure. The tricuspid regurgitant velocity is 3.02 m/s, and with an assumed right atrial pressure of 5 mmHg, the estimated right ventricular systolic pressure is 41.5 mmHg. Left Atrium: Left atrial size was normal in size. Right Atrium: Right atrial size was normal in size. Pericardium: There is no evidence of pericardial effusion. Mitral Valve: The mitral valve is normal in structure. Mild mitral valve regurgitation. No evidence of mitral valve stenosis. Tricuspid Valve: The tricuspid valve is normal in structure. Tricuspid valve regurgitation is mild . No evidence of tricuspid stenosis. Aortic Valve: The aortic valve is normal in structure. There is mild calcification of the aortic valve. Aortic valve regurgitation is not visualized. Aortic valve sclerosis/calcification is present, without any evidence of aortic stenosis. Aortic valve peak gradient measures 4.6 mmHg. Pulmonic Valve: The pulmonic valve was normal in structure. Pulmonic valve regurgitation is not visualized. No evidence of pulmonic stenosis. Aorta: The aortic root is normal in size and structure. Venous: The inferior vena cava is normal in size with greater than 50% respiratory variability, suggesting right atrial pressure of 3 mmHg. IAS/Shunts: No atrial level shunt detected by color flow Doppler. Additional Comments: 3D imaging was not performed.  LEFT VENTRICLE PLAX 2D LV EF:         Left            Diastology                ventricular     LV e' medial:    4.13 cm/s                ejection        LV E/e' medial:  19.4                fraction by      LV e' lateral:   5.66 cm/s                PLAX is 27      LV E/e' lateral: 14.2                %. LVIDd:         5.60 cm LVIDs:         4.90 cm LV PW:         1.20 cm LV IVS:        1.30 cm LVOT diam:     1.90 cm LV SV:         29 LV SV Index:   14 LVOT Area:     2.84 cm  LV Volumes (MOD) LV vol d, MOD    151.0 ml A4C: LV vol s, MOD    114.5 ml A4C: LV SV MOD A4C:   151.0 ml RIGHT VENTRICLE RV Basal diam:  4.10 cm RV S prime:     4.79 cm/s TAPSE (M-mode): 1.2 cm LEFT ATRIUM             Index        RIGHT ATRIUM           Index LA diam:        4.10 cm 1.91 cm/m   RA Area:     16.30 cm LA Vol (A2C):   61.8 ml 28.85 ml/m  RA Volume:   52.40 ml  24.46 ml/m LA Vol (A4C):   36.4 ml 16.99 ml/m LA Biplane Vol: 47.3 ml 22.08 ml/m  AORTIC VALVE AV Area (Vmax): 1.57 cm AV  Vmax:        107.00 cm/s AV Peak Grad:   4.6 mmHg LVOT Vmax:      59.10 cm/s LVOT Vmean:     38.700 cm/s LVOT VTI:       0.102 m  AORTA Ao Root diam: 3.60 cm Ao Asc diam:  3.10 cm MITRAL VALVE               TRICUSPID VALVE MV Area (PHT): 3.33 cm    TR Peak grad:   36.5 mmHg MV Decel Time: 228 msec    TR Vmax:        302.00 cm/s MV E velocity: 80.20 cm/s MV A velocity: 19.40 cm/s  SHUNTS MV E/A ratio:  4.13        Systemic VTI:  0.10 m                            Systemic Diam: 1.90 cm Julien Nordmann MD Electronically signed by Julien Nordmann MD Signature Date/Time: 12/25/2023/8:04:00 AM    Final     Cardiac Studies  Repeat echo pending   Patient Profile     [name] is an 89 y.o. with history of CAD s/p CABG, chronic systolic CHF/ischemic cardiomyopathy, paroxysmal atrial fibrillation, and CKD stage 3 presenting with shortness of breath, COVID, leg swelling  Assessment & Plan    1. Acute on chronic systolic CHF:  -History of ischemic cardiomyopathy, CABG  Abbott CRT-P device in place.   Last echo in 2/24 with EF 30-35%, moderate LVH.,   Repeat echo with EF 25 to 30%, RV dysfunction, mild to moderately elevated right heart  pressures -Continue Coreg 6.25 twice daily, BiDil (hydralazine and Imdur combo) Entresto 97/103 twice daily, Farxiga 10 daily, spironolactone 25 daily -Continue IV Lasix twice daily -Likely cardiorenal syndrome, renal function improving with diuresis -4.8 L this admission   2. Acute hypoxic respiratory failure -  likely combination of COVID PNA and HF -Remains very congested, wheezy on exam, suggest neb treatments every 6 hours   3. CAD: s/p CABG.   Cath in 7/24 with patent grafts with stable moderate stenosis in SVG-PDA managed medically.    - He is on Eliquis in place of aspirin, Crestor Ejection fraction 25 to 30%  4. Atrial fibrillation: Paroxysmal.   - NSR today with AV pacing on monitor Continue reduced dose Eliquis 2.5 mg bid, dosed lower for age > 80 and creatinine > 1.5.  -Hemoglobin stable 14   5.  Aki on CKD stage 3b:  - baseline Scr 1.6-1.9. Likely cardiorenal -Peak creatinine 2.1, 1.77, 1.73, Continued slow improvement   6. Hypokalemia -Potassium twice daily today   For questions or updates, please contact [facility] Please consult [email or web address] for contact info under        Signed, Julien Nordmann, MD  12/25/2023, 2:05 PM

## 2023-12-26 DIAGNOSIS — I509 Heart failure, unspecified: Secondary | ICD-10-CM | POA: Diagnosis not present

## 2023-12-26 DIAGNOSIS — I5023 Acute on chronic systolic (congestive) heart failure: Secondary | ICD-10-CM | POA: Diagnosis not present

## 2023-12-26 LAB — GLUCOSE, CAPILLARY
Glucose-Capillary: 85 mg/dL (ref 70–99)
Glucose-Capillary: 74 mg/dL (ref 70–99)
Glucose-Capillary: 167 mg/dL — ABNORMAL HIGH (ref 70–99)
Glucose-Capillary: 198 mg/dL — ABNORMAL HIGH (ref 70–99)

## 2023-12-26 LAB — BASIC METABOLIC PANEL
Anion gap: 9 (ref 5–15)
BUN: 27 mg/dL — ABNORMAL HIGH (ref 8–23)
CO2: 25 mmol/L (ref 22–32)
Calcium: 7.9 mg/dL — ABNORMAL LOW (ref 8.9–10.3)
Chloride: 104 mmol/L (ref 98–111)
Creatinine, Ser: 1.85 mg/dL — ABNORMAL HIGH (ref 0.61–1.24)
GFR, Estimated: 35 mL/min — ABNORMAL LOW (ref >60)
Glucose, Bld: 75 mg/dL (ref 70–99)
Potassium: 3.8 mmol/L (ref 3.5–5.1)
Sodium: 138 mmol/L (ref 135–145)

## 2023-12-26 LAB — CBC
HCT: 44.6 % (ref 39.0–52.0)
Hemoglobin: 14.2 g/dL (ref 13.0–17.0)
MCH: 26.9 pg (ref 26.0–34.0)
MCHC: 31.8 g/dL (ref 30.0–36.0)
MCV: 84.5 fL (ref 80.0–100.0)
Platelets: 122 10*3/uL — ABNORMAL LOW (ref 150–400)
RBC: 5.28 MIL/uL (ref 4.22–5.81)
RDW: 14.6 % (ref 11.5–15.5)
WBC: 9.5 10*3/uL (ref 4.0–10.5)
nRBC: 0 % (ref 0.0–0.2)

## 2023-12-26 LAB — MAGNESIUM: Magnesium: 1.7 mg/dL (ref 1.7–2.4)

## 2023-12-26 MED ORDER — POTASSIUM CHLORIDE CRYS ER 20 MEQ PO TBCR
20.0000 meq | EXTENDED_RELEASE_TABLET | Freq: Every day | ORAL | Status: DC
Start: 2023-12-27 — End: 2023-12-27
  Administered 2023-12-27: 20 meq via ORAL
  Filled 2023-12-26: qty 1

## 2023-12-26 MED ORDER — TORSEMIDE 20 MG PO TABS: 40.0000 mg | ORAL_TABLET | Freq: Every day | ORAL | Status: DC

## 2023-12-26 NOTE — Plan of Care (Signed)
  Problem: Coping: Goal: Ability to adjust to condition or change in health will improve Outcome: Progressing   Problem: Health Behavior/Discharge Planning: Goal: Ability to identify and utilize available resources and services will improve Outcome: Progressing Goal: Ability to manage health-related needs will improve Outcome: Progressing   Problem: Metabolic: Goal: Ability to maintain appropriate glucose levels will improve Outcome: Progressing

## 2023-12-26 NOTE — Plan of Care (Signed)
  Problem: Education: Goal: Knowledge of risk factors and measures for prevention of condition will improve Outcome: Progressing   Problem: Health Behavior/Discharge Planning: Goal: Ability to manage health-related needs will improve Outcome: Progressing   Problem: Skin Integrity: Goal: Risk for impaired skin integrity will decrease Outcome: Progressing   Problem: Clinical Measurements: Goal: Ability to maintain clinical measurements within normal limits will improve Outcome: Progressing Goal: Respiratory complications will improve Outcome: Progressing   Problem: Pain Managment: Goal: General experience of comfort will improve and/or be controlled Outcome: Progressing   Problem: Respiratory: Goal: Complications related to the disease process, condition or treatment will be avoided or minimized Outcome: Not Progressing   Problem: Clinical Measurements: Goal: Will remain free from infection Outcome: Not Progressing

## 2023-12-26 NOTE — Progress Notes (Signed)
Mobility Specialist - Progress Note   12/26/23 0928  Mobility  Activity Dangled on edge of bed;Stood at bedside;Transferred from bed to chair  Level of Assistance Standby assist, set-up cues, supervision of patient - no hands on  Assistive Device Front wheel walker  Distance Ambulated (ft) 4 ft  Activity Response Tolerated well  Mobility visit 1 Mobility  Mobility Specialist Start Time (ACUTE ONLY) 0909  Mobility Specialist Stop Time (ACUTE ONLY) 0925  Mobility Specialist Time Calculation (min) (ACUTE ONLY) 16 min   Pt supine upon entry, utilizing RA. Pt denied OOB amb d/t lack of mobility attempts, however agreeable to transfer to the recliner this date. Pt completed bed mob ModI, utilizing the bed railing to assist with bringing trunk from sup to sit. Pt STS to RW MinA and transferred to the recliner taking a few lateral and backwards steps with SBA-- no hands on assist required. Pt left seated in the recliner with needs within reach, RN notified.  Zetta Bills Mobility Specialist 12/26/23 9:32 AM

## 2023-12-26 NOTE — Progress Notes (Signed)
Rounding Note    Patient Name: [name] Date of Encounter: 12/26/2023  [facility] Cardiologist: Lorine Bears, MD   Subjective   Presenting to the ED this admission cough, fatigue, leg swelling COVID-positive, chest x-ray mild edema, BNP 3000 Started on IV Lasix  Feels significantly improved this AM, back to his old self. Will transition to PO diuretics in anticipation of likely discharge tomorrow.   Inpatient Medications    Scheduled Meds:  apixaban  2.5 mg Oral BID   carvedilol  6.25 mg Oral BID WC   ezetimibe  10 mg Oral Daily   guaiFENesin  1,200 mg Oral BID   insulin aspart  0-15 Units Subcutaneous TID WC   insulin aspart  0-5 Units Subcutaneous QHS   insulin glargine-yfgn  15 Units Subcutaneous Q2200   ipratropium-albuterol  3 mL Nebulization BID   [START ON 12/27/2023] potassium chloride SA  20 mEq Oral Daily   rosuvastatin  40 mg Oral Daily   sacubitril-valsartan  1 tablet Oral BID   sodium chloride HYPERTONIC  4 mL Nebulization BID   spironolactone  25 mg Oral Daily   tamsulosin  0.4 mg Oral Daily   [START ON 12/27/2023] torsemide  40 mg Oral Daily   Continuous Infusions:  PRN Meds: acetaminophen **OR** acetaminophen, albuterol, HYDROcodone-acetaminophen, ondansetron **OR** ondansetron (ZOFRAN) IV   Vital Signs    Vitals:   12/25/23 2005 12/25/23 2038 12/26/23 0600 12/26/23 0837  BP: (!) 100/51   115/70  Pulse: 60   71  Resp: 18   18  Temp: 98.8 F (37.1 C)   98.8 F (37.1 C)  TempSrc: Oral     SpO2: 96% 96%  93%  Weight:   93.5 kg   Height:        Intake/Output Summary (Last 24 hours) at 12/26/2023 1344 Last data filed at 12/26/2023 0601 Gross per 24 hour  Intake 120 ml  Output 2100 ml  Net -1980 ml      12/26/2023    6:00 AM 12/24/2023    5:00 AM 12/22/2023    3:53 PM  Last 3 Weights  Weight (lbs) 206 lb 2.1 oz 223 lb 5.2 oz 230 lb  Weight (kg) 93.5 kg 101.3 kg 104.327 kg      Telemetry    Normal sinus rhythm-  Personally Reviewed  ECG     - Personally Reviewed  Physical Exam    GENERAL: Well nourished and in no apparent distress at rest.  PULM:  Normal work of breathing, clear to auscultation bilaterally. Respirations are unlabored.  CARDIAC:  JVP: flat         Normal rate with regular rhythm. No murmurs, rubs or gallops.  Trace edema. Warm and well perfused extremities. ABDOMEN: Soft, non-tender, non-distended. NEUROLOGIC: Patient is oriented x3 with no focal or lateralizing neurologic deficits.      Patient Profile     Mr. [name] is an 89 y.o. with history of CAD s/p CABG, chronic systolic CHF/ischemic cardiomyopathy, paroxysmal atrial fibrillation, and CKD stage 3 presenting with shortness of breath, COVID, leg swelling  Assessment & Plan    1. Acute on chronic systolic CHF:  -History of ischemic cardiomyopathy, CABG  A[employer]ott CRT-P device in place.   Last echo in 2/24 with EF 30-35%, moderate LVH.,   Repeat echo with EF 25 to 30%, RV dysfunction, mild to moderately elevated right heart pressures -Continue Coreg 6.25 twice daily, Entresto 24/26 twice daily (had not been taking at home), [place] 10  daily, spironolactone 25 daily - Stop bidil, if persistently elevated BP can titrate entresto -Return to torsemide 40mg  daily tomorrow -Renal function improving with diuresis   2. Acute hypoxic respiratory failure -  likely combination of COVID PNA and HF - Improving, off oxygen with clear lungs   3. CAD: s/p CABG.   Cath in 7/24 with patent grafts with stable moderate stenosis in SVG-PDA managed medically.    - He is on Eliquis in place of aspirin, Crestor  4. Atrial fibrillation: Paroxysmal.   - NSR today with AV pacing on monitor Continue reduced dose Eliquis 2.5 mg bid, dosed lower for age > 80 and creatinine > 1.5.  -Hemoglobin stable   5.  Aki on CKD stage 3b:  - baseline Scr 1.6-1.9. Likely cardiorenal -Peak creatinine 2.1, mildly increased today though within the margin  of error of the test with improving BUN, adjustment of diuretics as above    6. Hypokalemia -Supplement as needed   Plan for discharge tomorrow       Signed, Duane Minus, MD  12/26/2023, 1:44 PM

## 2023-12-26 NOTE — Progress Notes (Signed)
  PROGRESS NOTE    [name]  TKZ:[id number] DOB: 1934-12-03 DOA: 12/22/2023 PCP: Dorris Carnes, MD  160A/160A-AA  LOS: 4 days   Brief hospital course:   Assessment & Plan: [name] is a 89 y.o. 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  He notes that he has been feeling fatigued for about a week and had an associated cough.     Acute on chronic systolic CHF (congestive heart failure) (HCC) --Last echo in 2/24 with EF 30-35%, moderate LVH.  Current Echo LVEF 25-30%. --cont coreg, Entresto 24/26 twice daily (had not been taking at home), spironolactone --d/c Bidil --resume torsemide 40 mg daily tomorrow --cont [name]  COVID-19 Symptomatic for weakness, fatigue and cough Symptoms present for over a week so outside window for therapeutic benefit of antivirals Airborne precautions --hypertonic saline neb and DuoNeb BID  CAD, multiple vessel, s/p CABG Elevated troponin Patient with mild chest discomfort Troponin slightly elevated at 39 suspecting demand ischemia --on eliquis in place of ASA --cont Crestor  Paroxysmal atrial fibrillation (HCC) --cont coreg and Eliquis  AKI CKD stage 3b, GFR 30-44 ml/min (HCC) --Cr 2.14 on presentation.  Improved to 1.77 after diuresis.  Likely cardiorenal syndrome.  --monitor Cr while diuresing  History of sustained ventricular fibrillation History of complete heart block Cardiac resynchronization therapy pacemaker in place No acute issues suspected  Hypokalemia --monitor and supplement PRN  Acute hypoxic respiratory failure ruled out --no documented hypoxia   DVT prophylaxis:  TD:DUKGURK Code Status: Full code  Family Communication:  Level of care: Med-Surg Dispo:   The patient is from: home Anticipated d/c is to: home Anticipated d/c date is: tomorrow   Subjective and Interval History:  Pt reported headache this morning.   Objective: Vitals:   12/25/23 2038 12/26/23 0600 12/26/23 0837 12/26/23 1421  BP:   115/70 (!) 112/59  Pulse:   71 64  Resp:   18 17  Temp:   98.8 F (37.1 C) 99.2 F (37.3 C)  TempSrc:      SpO2: 96%  93% 98%  Weight:  93.5 kg    Height:        Intake/Output Summary (Last 24 hours) at 12/26/2023 1759 Last data filed at 12/26/2023 0601 Gross per 24 hour  Intake --  Output 1550 ml  Net -1550 ml   Filed Weights   12/22/23 1553 12/24/23 0500 12/26/23 0600  Weight: 104.3 kg 101.3 kg 93.5 kg    Examination:   Constitutional: NAD, AAOx3 HEENT: conjunctivae and lids normal, EOMI CV: No cyanosis.   RESP: normal respiratory effort, on RA Extremities: No effusions, edema in BLE SKIN: warm, dry Neuro: II - XII grossly intact.     Data Reviewed: I have personally reviewed labs and imaging studies  Time spent: 35 minutes  [name], MD [facility] If 7PM-7AM, please contact night-coverage 12/26/2023, 5:59 PM

## 2023-12-26 NOTE — Care Management Important Message (Signed)
Important Message  Patient Details  Name: [name] MRN: [id number] Date of Birth: 02/25/1935   Important Message Given:  Yes - Medicare IM     Cristela Blue, CMA 12/26/2023, 10:08 AM

## 2023-12-27 ENCOUNTER — Other Ambulatory Visit: Payer: Self-pay

## 2023-12-27 DIAGNOSIS — I509 Heart failure, unspecified: Secondary | ICD-10-CM | POA: Diagnosis not present

## 2023-12-27 DIAGNOSIS — I5023 Acute on chronic systolic (congestive) heart failure: Secondary | ICD-10-CM | POA: Diagnosis not present

## 2023-12-27 LAB — GLUCOSE, CAPILLARY
Glucose-Capillary: 191 mg/dL — ABNORMAL HIGH (ref 70–99)
Glucose-Capillary: 176 mg/dL — ABNORMAL HIGH (ref 70–99)

## 2023-12-27 LAB — BASIC METABOLIC PANEL
Anion gap: 9 (ref 5–15)
BUN: 34 mg/dL — ABNORMAL HIGH (ref 8–23)
CO2: 24 mmol/L (ref 22–32)
Calcium: 7.8 mg/dL — ABNORMAL LOW (ref 8.9–10.3)
Chloride: 102 mmol/L (ref 98–111)
Creatinine, Ser: 1.98 mg/dL — ABNORMAL HIGH (ref 0.61–1.24)
GFR, Estimated: 32 mL/min — ABNORMAL LOW (ref >60)
Glucose, Bld: 132 mg/dL — ABNORMAL HIGH (ref 70–99)
Potassium: 4.2 mmol/L (ref 3.5–5.1)
Sodium: 135 mmol/L (ref 135–145)

## 2023-12-27 LAB — MAGNESIUM: Magnesium: 1.8 mg/dL (ref 1.7–2.4)

## 2023-12-27 MED ORDER — POTASSIUM CHLORIDE CRYS ER 20 MEQ PO TBCR
20.0000 meq | EXTENDED_RELEASE_TABLET | Freq: Every day | ORAL | 0 refills | Status: DC
  Filled 2023-12-27: qty 30, 30d supply, fill #0

## 2023-12-27 MED ORDER — CARVEDILOL 6.25 MG PO TABS
6.2500 mg | ORAL_TABLET | Freq: Two times a day (BID) | ORAL | 2 refills | Status: DC
  Filled 2023-12-27: qty 60, 30d supply, fill #0

## 2023-12-27 MED ORDER — SPIRONOLACTONE 25 MG PO TABS
25.0000 mg | ORAL_TABLET | Freq: Every day | ORAL | 0 refills | Status: DC
  Filled 2023-12-27: qty 30, 30d supply, fill #0

## 2023-12-27 MED ORDER — DAPAGLIFLOZIN PROPANEDIOL 10 MG PO TABS
10.0000 mg | ORAL_TABLET | Freq: Every day | ORAL | Status: DC
  Administered 2023-12-27: 10 mg via ORAL
  Filled 2023-12-27: qty 1

## 2023-12-27 MED ORDER — TORSEMIDE 20 MG PO TABS
20.0000 mg | ORAL_TABLET | Freq: Every day | ORAL | Status: DC
  Administered 2023-12-27: 20 mg via ORAL
  Filled 2023-12-27: qty 1

## 2023-12-27 MED ORDER — SACUBITRIL-VALSARTAN 24-26 MG PO TABS
1.0000 | ORAL_TABLET | Freq: Two times a day (BID) | ORAL | 2 refills | Status: DC
  Filled 2023-12-27: qty 60, 30d supply, fill #0

## 2023-12-27 MED ORDER — DAPAGLIFLOZIN PROPANEDIOL 10 MG PO TABS
10.0000 mg | ORAL_TABLET | Freq: Every day | ORAL | 2 refills | Status: DC
  Filled 2023-12-27: qty 30, 30d supply, fill #0

## 2023-12-27 MED ORDER — GUAIFENESIN ER 600 MG PO TB12
1200.0000 mg | ORAL_TABLET | Freq: Two times a day (BID) | ORAL | 0 refills | Status: AC
  Filled 2023-12-27: qty 40, 10d supply, fill #0

## 2023-12-27 MED ORDER — TORSEMIDE 20 MG PO TABS
20.0000 mg | ORAL_TABLET | Freq: Every day | ORAL | 0 refills | Status: DC
  Filled 2023-12-27: qty 30, 30d supply, fill #0

## 2023-12-27 NOTE — Evaluation (Signed)
Occupational Therapy Evaluation Patient Details Name: [name] MRN: [id number] DOB: 1935-04-04 Today's Date: 12/27/2023   History of Present Illness   presented to ER secondary to SOB, chest pain; admitted for management of acute/chronic CHF, covid-19 viral infection.     Clinical Impressions Patient received for OT evaluation. See flowsheet below for details of function. Generally, patient requiring CGA with RW for functional mobility, and set up-MIN A for ADLs. Patient will benefit from continued OT while in acute care.      If plan is discharge home, recommend the following:   A little help with bathing/dressing/bathroom;A little help with walking and/or transfers;Assistance with cooking/housework;Direct supervision/assist for medications management;Direct supervision/assist for financial management;Assist for transportation;Help with stairs or ramp for entrance     Functional Status Assessment   Patient has had a recent decline in their functional status and demonstrates the ability to make significant improvements in function in a reasonable and predictable amount of time.     Equipment Recommendations   None recommended by OT     Recommendations for Other Services         Precautions/Restrictions   Precautions Precautions: Fall Restrictions Weight Bearing Restrictions Per Provider Order: No     Mobility Bed Mobility               General bed mobility comments: Not tested during session; pt received on toilet in care of PT.    Transfers Overall transfer level: Needs assistance Equipment used: Rolling walker (2 wheels) Transfers: Sit to/from Stand, Bed to chair/wheelchair/BSC Sit to Stand: Contact guard assist Stand pivot transfers: Contact guard assist         General transfer comment: Cues for hand placement and staying within the RW.      Balance Overall balance assessment: Needs assistance Sitting-balance support: No upper  extremity supported, Feet supported Sitting balance-[name]: Good     Standing balance support: Bilateral upper extremity supported Standing balance-[name]: Poor                             ADL either performed or assessed with clinical judgement   ADL Overall ADL's : Needs assistance/impaired                                       General ADL Comments: Pt requiring set up for full body dressing. Able to walk approx 6 feet with RW to toilet CGA; completed hygiene while seated. Able to wash hands standing at sink. Decreased activity tolerance; close to baseline.     Vision Baseline Vision/History: 1 Wears glasses       Perception         Praxis         Pertinent Vitals/Pain Pain Assessment Pain Assessment: No/denies pain     Extremity/Trunk Assessment Upper Extremity Assessment Upper Extremity Assessment: Overall WFL for tasks assessed   Lower Extremity Assessment Lower Extremity Assessment: Overall WFL for tasks assessed   Cervical / Trunk Assessment Cervical / Trunk Assessment: Normal   Communication Communication Communication: No apparent difficulties   Cognition Arousal: Alert Behavior During Therapy: WFL for tasks assessed/performed Cognition: No apparent impairments             OT - Cognition Comments: Pleasant, follows all commands.  Following commands: Intact       Cueing  General Comments          Exercises     Shoulder Instructions      Home Living Family/patient expects to be discharged to:: Private residence Living Arrangements: Spouse/significant other Available Help at Discharge: Family;Available 24 hours/day Type of Home: House Home Access: Stairs to enter [employer] of Steps: 5 Entrance Stairs-Rails: None Home Layout: One level     Bathroom Shower/Tub: Producer, television/film/video: Handicapped height     Home Equipment: Cane - single  Librarian, academic (2 wheels);Shower seat;Grab bars - toilet;Grab bars - tub/shower          Prior Functioning/Environment Prior Level of Function : Independent/Modified Independent             Mobility Comments: Household and limited community mobilization; use of SPC on stairs.  Denies fall history; no home O2. ADLs Comments: (I) ADLs; pt states usually wife drives, but sometimes he does. Wife assists with all ADLs. Pt with limited activity tolerance. Enjoys watching [employer] basketball.    OT Problem List: Impaired balance (sitting and/or standing);Decreased activity tolerance   OT Treatment/Interventions: Self-care/ADL training;Therapeutic exercise;Energy conservation;DME and/or AE instruction;Therapeutic activities;Patient/family education      OT Goals(Current goals can be found in the care plan section)   Acute Rehab OT Goals Patient Stated Goal: Go home. OT Goal Formulation: With patient Time For Goal Achievement: 01/10/24 Potential to Achieve Goals: Good ADL Goals Pt Will Transfer to Toilet: with modified independence;ambulating   OT Frequency:  Min 1X/week    Co-evaluation              AM-PAC OT "6 Clicks" Daily Activity     Outcome Measure Help from another person eating meals?: None Help from another person taking care of personal grooming?: A Little Help from another person toileting, which includes using toliet, bedpan, or urinal?: A Little Help from another person bathing (including washing, rinsing, drying)?: A Little Help from another person to put on and taking off regular upper body clothing?: None Help from another person to put on and taking off regular lower body clothing?: A Little 6 Click Score: 20   End of Session Equipment Utilized During Treatment: Rolling walker (2 wheels) Nurse Communication: Mobility status  Activity Tolerance: Patient tolerated treatment well;Patient limited by fatigue Patient left: in chair;with call bell/phone  within reach;with chair alarm set  OT Visit Diagnosis: Unsteadiness on feet (R26.81)                Time: 1610-9604 OT Time Calculation (min): 26 min Charges:  OT General Charges $OT Visit: 1 Visit OT Evaluation $OT Eval Moderate Complexity: 1 Mod OT Treatments $Self Care/Home Management : 8-22 mins  Linward Foster, MS, OTR/L  Alvester Morin 12/27/2023, 12:17 PM

## 2023-12-27 NOTE — Plan of Care (Signed)
   Problem: Education: Goal: Knowledge of risk factors and measures for prevention of condition will improve Outcome: Progressing   Problem: Coping: Goal: Psychosocial and spiritual needs will be supported Outcome: Progressing   Problem: Respiratory: Goal: Will maintain a patent airway Outcome: Progressing

## 2023-12-27 NOTE — TOC Progression Note (Addendum)
Transition of Care [facility]) - Progression Note    Patient Details  Name: [name] MRN: [id number] Date of Birth: 1935/07/07  [facility] CM/SW Contact  Marlowe Sax, RN Phone Number: 12/27/2023, 12:21 PM  Clinical Narrative:     Met with the patient atr the bedside, He reports that he lives at home with his wife, they will provide transportation home He reports that he has a cane and walker  He requested to have the same [facility] agency that he used in the past  According to Bamboo, Patient [name] he used [facility] in 2021 I reached out to Maralyn Sago with [facility] and asked if they could accept, awaiting an answer Update, [facility]  Expected Discharge Plan: Home w Home Health Services Barriers to Discharge: Barriers Resolved  Expected Discharge Plan and Services   Discharge Planning Services: CM Consult   Living arrangements for the past 2 months: Single Family Home Expected Discharge Date: 12/27/23               DME Arranged: N/A DME Agency: NA       HH Arranged: PT HH Agency: [facility] Date HH Agency Contacted: 12/27/23 Time HH Agency Contacted: 1221 Representative spoke with at [facility] Agency: Maralyn Sago   Social Determinants of Health (SDOH) Interventions SDOH Screenings   Food Insecurity: No Food Insecurity (12/24/2023)  Housing: Low Risk  (12/24/2023)  Transportation Needs: No Transportation Needs (12/24/2023)  Utilities: Not At Risk (12/24/2023)  Depression (PHQ2-9): Low Risk  (04/07/2022)  Financial Resource Strain: Low Risk  (06/29/2021)   Received from [facility]  Physical Activity: Inactive (07/27/2019)  Social Connections: Unknown (12/24/2023)  Stress: No Stress Concern Present (07/27/2019)  Tobacco Use: Medium Risk (12/23/2023)    Readmission Risk Interventions    03/10/2023   12:47 PM 03/29/2022    1:18 PM 01/05/2022    2:16 PM  Readmission Risk Prevention Plan  Transportation Screening Complete Complete  Complete  PCP or Specialist Appt within 3-5 Days Complete Complete Complete  HRI or Home Care Consult  -- Complete  Social Work Consult for Recovery Care Planning/Counseling Complete -- Complete  Palliative Care Screening Not Applicable Not Applicable Not Applicable  Medication Review Oceanographer) Complete Complete Complete

## 2023-12-27 NOTE — Evaluation (Signed)
Physical Therapy Evaluation Patient Details Name: [name] MRN: [id number] DOB: 1935-05-06 Today's Date: 12/27/2023  History of Present Illness  presented to ER secondary to SOB, chest pain; admitted for management of acute/chronic CHF, covid-19 viral infection.  Clinical Impression  Patient resting in bed upon arrival to room; alert and oriented, follows commands and eager/hopeful for upcoming discharge home.   Bilat UE/LE strength and ROM grossly symmetrical and WFL for basic transfers and mobility; no focal weakness appreciated.  Denies pain at rest or with exertion. Currently performing bed mobility at sup/mod indep level; sit/stand, basic transfers and gait (10' x1, 15' x1) with RW, cga/min assist.  Demonstrates mild forward trunk flexion, cuing for walker position and management.  Limited heel strike/toe off with choppy stepping pattern, but no overt buckling or LOB.  Mod SOB with exertional activities; sats >93% on RA throughout.  Additional gait/activity deferred due to toileting needs; will progress further next treatment session as appropriate. Would benefit from skilled PT to address above deficits and promote optimal return to PLOF.; recommend post-acute PT follow up as indicated by interdisciplinary care team.             If plan is discharge home, recommend the following: A little help with bathing/dressing/bathroom;A little help with walking and/or transfers   Can travel by private vehicle        Equipment Recommendations None recommended by PT (has RW, [facility])  Recommendations for Other Services       Functional Status Assessment Patient has had a recent decline in their functional status and demonstrates the ability to make significant improvements in function in a reasonable and predictable amount of time.     Precautions / Restrictions Precautions Precautions: Fall Restrictions Weight Bearing Restrictions Per Provider Order: No      Mobility  Bed  Mobility Overal bed mobility: Modified Independent                  Transfers Overall transfer level: Needs assistance Equipment used: Rolling walker (2 wheels) Transfers: Sit to/from Stand, Bed to chair/wheelchair/BSC Sit to Stand: Contact guard assist Stand pivot transfers: Contact guard assist         General transfer comment: cuing for hand placement; UE support required for lift off and stabilization    Ambulation/Gait Ambulation/Gait assistance: Contact guard assist Gait Distance (Feet):  (10' x1, 15' x1) Assistive device: Rolling walker (2 wheels)         General Gait Details: mild forward trunk flexion, cuing for walker position and management.  Limited heel strike/toe off with choppy stepping pattern, but no overt buckling or LOB.  Mod SOB with exertional activities; sats >93% on RA throughout.  Stairs            Wheelchair Mobility     Tilt Bed    Modified Rankin (Stroke Patients Only)       Balance Overall balance assessment: Needs assistance Sitting-balance support: No upper extremity supported, Feet supported Sitting balance-[name]: Good     Standing balance support: Bilateral upper extremity supported Standing balance-[name]: Poor                               Pertinent Vitals/Pain Pain Assessment Pain Assessment: No/denies pain    Home Living Family/patient expects to be discharged to:: Private residence Living Arrangements: Spouse/significant other Available Help at Discharge: Family;Available 24 hours/day Type of Home: House Home Access: Stairs to enter Entrance  Stairs-Rails: None (endorses plans for ramp installation) Entrance Stairs-Number of Steps: 5   Home Layout: One level Home Equipment: Cane - single Librarian, academic (2 wheels)      Prior Function Prior Level of Function : Independent/Modified Independent             Mobility Comments: Indep for ADLs, household and limited community  mobilization; use of SPC on stairs.  Denies fall history; no home O2.       Extremity/Trunk Assessment   Upper Extremity Assessment Upper Extremity Assessment: Overall WFL for tasks assessed    Lower Extremity Assessment Lower Extremity Assessment: Overall WFL for tasks assessed (grossly at least 4/5 throughout; no focal weakness appreciated)       Communication        Cognition Arousal: Alert Behavior During Therapy: WFL for tasks assessed/performed   PT - Cognitive impairments: No apparent impairments                                 Cueing       General Comments      Exercises Other Exercises Other Exercises: Toilet transfer, ambulatory with RW, cga/min assist; sit/stand from standard height toilet with grab bar, cga/min assist.  Standing balance at sink for hand hygiene, cga/close sup; does require UE support for functional reach beyond immediate BOS   Assessment/Plan    PT Assessment Patient needs continued PT services  PT Problem List Decreased strength;Decreased range of motion;Decreased activity tolerance;Decreased balance;Decreased mobility;Decreased coordination;Decreased knowledge of use of DME;Decreased safety awareness;Decreased knowledge of precautions;Cardiopulmonary status limiting activity       PT Treatment Interventions DME instruction;Gait training;Stair training;Functional mobility training;Therapeutic activities;Therapeutic exercise;Balance training;Patient/family education    PT Goals (Current goals can be found in the Care Plan section)  Acute Rehab PT Goals Patient Stated Goal: to return home PT Goal Formulation: With patient Time For Goal Achievement: 01/10/24 Potential to Achieve Goals: Good    Frequency Min 1X/week     Co-evaluation               AM-PAC PT "6 Clicks" Mobility  Outcome Measure Help needed turning from your back to your side while in a flat bed without using bedrails?: None Help needed moving from  lying on your back to sitting on the side of a flat bed without using bedrails?: None Help needed moving to and from a bed to a chair (including a wheelchair)?: A Little Help needed standing up from a chair using your arms (e.g., wheelchair or bedside chair)?: A Little Help needed to walk in hospital room?: A Little Help needed climbing 3-5 steps with a railing? : A Little 6 Click Score: 20    End of Session Equipment Utilized During Treatment: Gait belt Activity Tolerance: Patient tolerated treatment well Patient left: in chair;with call bell/phone within reach;with chair alarm set Nurse Communication: Mobility status PT Visit Diagnosis: Muscle weakness (generalized) (M62.81);Difficulty in walking, not elsewhere classified (R26.2)    Time: 6045-4098 PT Time Calculation (min) (ACUTE ONLY): 25 min   Charges:   PT Evaluation $PT Eval Moderate Complexity: 1 Mod PT Treatments $Therapeutic Activity: 8-22 mins PT General Charges $$ ACUTE PT VISIT: 1 Visit        Akirah Storck H. Manson Passey, PT, DPT, NCS 12/27/23, 10:40 AM [phone]

## 2023-12-27 NOTE — Discharge Summary (Signed)
Physician Discharge Summary   [name]  male DOB: 02-19-1935  JJO:[id number]  PCP: Dorris Carnes, MD  Admit date: 12/22/2023 Discharge date: 12/27/2023  Admitted From: home Disposition:  home Home Health: Yes CODE STATUS: Full code  Discharge Instructions     Diet - low sodium heart healthy   Complete by: As directed       Hospital Course:  For full details, please see H&P, progress notes, consult notes and ancillary notes.  Briefly,  [name] is a 89 y.o. 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    He notes that he has been feeling fatigued for about a week and had an associated cough.     Acute on chronic systolic CHF (congestive heart failure) (HCC) --Last echo in 2/24 with EF 30-35%, moderate LVH.  Current Echo LVEF 25-30%. --cardio consulted.  Pt was diuresed with IV lasix 80 mg BID.  discharged on torsemide 20 mg daily. --cont coreg, Entresto 24/26 twice daily (had not been taking Entresto at home), spironolactone.  Home hydralazine and Imdur d/c'ed by cardio. --[name] --outpatient f/u with heart failure Dr. Shirlee Latch on 03/04    COVID-19 Symptomatic for weakness, fatigue and cough Symptoms present for over a week so outside window for therapeutic benefit of antivirals --received hypertonic saline neb and DuoNeb BID   Elevated troponin Patient with mild chest discomfort Troponin slightly elevated at 39 suspecting demand ischemia   CAD, multiple vessel, s/p CABG --on eliquis in place of ASA --cont Crestor  Paroxysmal atrial fibrillation (HCC) --cont coreg and Eliquis   AKI CKD stage 3b, GFR  30-44 ml/min (HCC) --Cr 2.14 on presentation.  Improved to 1.77 after diuresis.  Likely cardiorenal syndrome.    History of sustained ventricular fibrillation History of complete heart block Cardiac resynchronization therapy pacemaker in place No acute issues suspected   Hypokalemia --monitored and supplemented PRN   Acute hypoxic respiratory failure ruled out --no documented hypoxia    Unless noted above, medications under "STOP" list are ones pt was not taking PTA.  Discharge Diagnoses:  Principal Problem:   CHF exacerbation (HCC) Active Problems:   Acute on chronic systolic CHF (congestive heart failure) (HCC)   COVID-19   CAD, multiple vessel   Paroxysmal atrial fibrillation (HCC)   CKD stage 3b, GFR 30-44 ml/min (HCC)   History of sustained ventricular fibrillation   Cardiac resynchronization therapy pacemaker (CRT-P) in place   Shortness of breath   30 Day Unplanned Readmission Risk Score    Flowsheet Row ED to Hosp-Admission (Current) from 12/22/2023 in [facility] (1A)  30 Day Unplanned Readmission Risk Score (%) 26.15 Filed at 12/27/2023 1200       This score is the patient's risk of an unplanned readmission within 30 days of being discharged (0 -100%). The score is based on dignosis, age, lab data, medications, orders, and past utilization.   Low:  0-14.9   Medium: 15-21.9   High: 22-29.9   Extreme: 30 and above         Discharge Instructions:  Allergies as of 12/27/2023       Reactions   Angiotensin Receptor Blockers  hyperkalemia   Spironolactone    Hyperkalemia Pt states he is not allergic   Metformin Diarrhea        Medication List     STOP taking these medications    acyclovir 800 MG tablet Commonly known as: ZOVIRAX   albuterol 108 (90 Base) MCG/ACT inhaler Commonly known as: VENTOLIN HFA   hydrALAZINE 25 MG tablet Commonly known as: APRESOLINE   isosorbide mononitrate 30 MG 24 hr  tablet Commonly known as: IMDUR   ketoconazole 2 % cream Commonly known as: NIZORAL   metolazone 2.5 MG tablet Commonly known as: ZAROXOLYN   sacubitril-valsartan 97-103 MG Commonly known as: ENTRESTO Replaced by: Sherryll Burger 24-26 MG       TAKE these medications    acetaminophen 500 MG tablet Commonly known as: TYLENOL Take 1,000 mg by mouth every 6 (six) hours as needed for mild pain, fever or headache.   apixaban 2.5 MG Tabs tablet Commonly known as: Eliquis Take 1 tablet (2.5 mg total) by mouth 2 (two) times daily.   carvedilol 6.25 MG tablet Commonly known as: COREG Take 1 tablet (6.25 mg total) by mouth 2 (two) times daily with a meal.   dapagliflozin propanediol 10 MG Tabs tablet Commonly known as: Farxiga Take 1 tablet (10 mg total) by mouth daily.   Entresto 24-26 MG Generic drug: sacubitril-valsartan Take 1 tablet by mouth 2 (two) times daily. Replaces: sacubitril-valsartan 97-103 MG   ezetimibe 10 MG tablet Commonly known as: ZETIA TAKE 1 TABLET BY MOUTH EVERY DAY   FT Mucus Relief 12HR 600 MG 12 hr tablet Generic drug: guaiFENesin Take 2 tablets (1,200 mg total) by mouth 2 (two) times daily for 7 days.   insulin degludec 100 UNIT/ML FlexTouch Pen Commonly known as: TRESIBA Inject 0.15 mLs into the skin at bedtime.   magnesium oxide 400 MG tablet Commonly known as: MAG-OX Take 1 tablet (400 mg total) by mouth daily.   pantoprazole 40 MG tablet Commonly known as: PROTONIX Take 40 mg by mouth daily.   polyethylene glycol 17 g packet Commonly known as: MIRALAX / GLYCOLAX Take 17 g by mouth daily as needed for moderate constipation.   potassium chloride SA 20 MEQ tablet Commonly known as: KLOR-CON M Take 1 tablet (20 mEq total) by mouth daily. Reduced from 40 mEq daily. What changed:  how much to take additional instructions   Repatha 140 MG/ML Sosy Generic drug: Evolocumab Inject into the skin.   Repatha SureClick 140 MG/ML Soaj Generic  drug: Evolocumab Inject 140 mg into the skin.   rosuvastatin 40 MG tablet Commonly known as: CRESTOR Take 1 tablet (40 mg total) by mouth daily.   spironolactone 25 MG tablet Commonly known as: ALDACTONE Take 1 tablet (25 mg total) by mouth daily.   tamsulosin 0.4 MG Caps capsule Commonly known as: FLOMAX Take 0.4 mg by mouth daily.   torsemide 20 MG tablet Commonly known as: DEMADEX Take 1 tablet (20 mg total) by mouth daily. Reduced from 40 mg daily. What changed:  how much to take additional instructions               Durable Medical Equipment  (From admission, onward)           Start     Ordered   12/27/23 1251  For home use only DME Walker rolling  Once       Question Answer Comment  Walker: With 5 Inch Wheels   Patient needs a walker to treat with the  following condition Impaired mobility      12/27/23 1251             Follow-up Information     Dorris Carnes, MD Follow up in 1 week(s).   Specialty: Internal Medicine Contact information: [address] [place] [address] [phone]                 Allergies  Allergen Reactions   Angiotensin Receptor Blockers     hyperkalemia   Spironolactone     Hyperkalemia Pt states he is not allergic   Metformin Diarrhea     The results of significant diagnostics from this hospitalization (including imaging, microbiology, ancillary and laboratory) are listed below for reference.   Consultations:   Procedures/Studies: ECHOCARDIOGRAM COMPLETE Result Date: 12/25/2023    ECHOCARDIOGRAM REPORT   Patient Name:   KENTON FORTIN Date of Exam: 12/24/2023 Medical Rec #:  [id number]    Height:       68.0 in Accession #:    [id number]   Weight:       223.3 lb Date of Birth:  06-08-1935   BSA:          2.142 m Patient Age:    88 years     BP:           133/80 mmHg Patient Gender: M            HR:           64 bpm. Exam Location:  [facility] Procedure: 2D Echo and Intracardiac Opacification Agent  (Both Spectral and Color            Flow Doppler were utilized during procedure). Indications:     CHF I50.31  History:         Patient has prior history of Echocardiogram examinations, most                  recent 12/16/2022.  Sonographer:     Overton Mam RDCS, FASE Referring Phys:  [id number] Andris Baumann Diagnosing Phys: Julien Nordmann MD  Sonographer Comments: Technically difficult study due to poor echo windows. IMPRESSIONS  1. Left ventricular ejection fraction, by estimation, is 25 to 30%. Left ventricular ejection fraction by PLAX is 27 %. The left ventricle has severely decreased function. The left ventricle has no regional wall motion abnormalities. Left ventricular diastolic parameters are indeterminate.  2. Right ventricular systolic function is moderately reduced. The right ventricular size is mildly enlarged. There is mildly elevated pulmonary artery systolic pressure. The estimated right ventricular systolic pressure is 41.5 mmHg.  3. The mitral valve is normal in structure. Mild mitral valve regurgitation. No evidence of mitral stenosis.  4. The aortic valve is normal in structure. There is mild calcification of the aortic valve. Aortic valve regurgitation is not visualized. Aortic valve sclerosis/calcification is present, without any evidence of aortic stenosis.  5. The inferior vena cava is normal in size with greater than 50% respiratory variability, suggesting right atrial pressure of 3 mmHg. FINDINGS  Left Ventricle: Left ventricular ejection fraction, by estimation, is 25 to 30%. Left ventricular ejection fraction by PLAX is 27 %. The left ventricle has severely decreased function. The left ventricle has no regional wall motion abnormalities. Definity contrast agent was given IV to delineate the left ventricular endocardial borders. Strain imaging was not performed. The left ventricular internal cavity size was normal in size. There is no left ventricular hypertrophy. Left ventricular  diastolic parameters are indeterminate. Right  Ventricle: The right ventricular size is mildly enlarged. No increase in right ventricular wall thickness. Right ventricular systolic function is moderately reduced. There is mildly elevated pulmonary artery systolic pressure. The tricuspid regurgitant velocity is 3.02 m/s, and with an assumed right atrial pressure of 5 mmHg, the estimated right ventricular systolic pressure is 41.5 mmHg. Left Atrium: Left atrial size was normal in size. Right Atrium: Right atrial size was normal in size. Pericardium: There is no evidence of pericardial effusion. Mitral Valve: The mitral valve is normal in structure. Mild mitral valve regurgitation. No evidence of mitral valve stenosis. Tricuspid Valve: The tricuspid valve is normal in structure. Tricuspid valve regurgitation is mild . No evidence of tricuspid stenosis. Aortic Valve: The aortic valve is normal in structure. There is mild calcification of the aortic valve. Aortic valve regurgitation is not visualized. Aortic valve sclerosis/calcification is present, without any evidence of aortic stenosis. Aortic valve peak gradient measures 4.6 mmHg. Pulmonic Valve: The pulmonic valve was normal in structure. Pulmonic valve regurgitation is not visualized. No evidence of pulmonic stenosis. Aorta: The aortic root is normal in size and structure. Venous: The inferior vena cava is normal in size with greater than 50% respiratory variability, suggesting right atrial pressure of 3 mmHg. IAS/Shunts: No atrial level shunt detected by color flow Doppler. Additional Comments: 3D imaging was not performed.  LEFT VENTRICLE PLAX 2D LV EF:         Left            Diastology                ventricular     LV e' medial:    4.13 cm/s                ejection        LV E/e' medial:  19.4                fraction by     LV e' lateral:   5.66 cm/s                PLAX is 27      LV E/e' lateral: 14.2                %. LVIDd:         5.60 cm LVIDs:          4.90 cm LV PW:         1.20 cm LV IVS:        1.30 cm LVOT diam:     1.90 cm LV SV:         29 LV SV Index:   14 LVOT Area:     2.84 cm  LV Volumes (MOD) LV vol d, MOD    151.0 ml A4C: LV vol s, MOD    114.5 ml A4C: LV SV MOD A4C:   151.0 ml RIGHT VENTRICLE RV Basal diam:  4.10 cm RV S prime:     4.79 cm/s TAPSE (M-mode): 1.2 cm LEFT ATRIUM             Index        RIGHT ATRIUM           Index LA diam:        4.10 cm 1.91 cm/m   RA Area:     16.30 cm LA Vol (A2C):   61.8 ml 28.85 ml/m  RA Volume:   52.40 ml  24.46 ml/m LA Vol (A4C):  36.4 ml 16.99 ml/m LA Biplane Vol: 47.3 ml 22.08 ml/m  AORTIC VALVE AV Area (Vmax): 1.57 cm AV Vmax:        107.00 cm/s AV Peak Grad:   4.6 mmHg LVOT Vmax:      59.10 cm/s LVOT Vmean:     38.700 cm/s LVOT VTI:       0.102 m  AORTA Ao Root diam: 3.60 cm Ao Asc diam:  3.10 cm MITRAL VALVE               TRICUSPID VALVE MV Area (PHT): 3.33 cm    TR Peak grad:   36.5 mmHg MV Decel Time: 228 msec    TR Vmax:        302.00 cm/s MV E velocity: 80.20 cm/s MV A velocity: 19.40 cm/s  SHUNTS MV E/A ratio:  4.13        Systemic VTI:  0.10 m                            Systemic Diam: 1.90 cm Julien Nordmann MD Electronically signed by Julien Nordmann MD Signature Date/Time: 12/25/2023/8:04:00 AM    Final    DG Chest 2 View Result Date: 12/22/2023 CLINICAL DATA:  Chest pain, shortness of breath EXAM: CHEST - 2 VIEW COMPARISON:  06/03/2023 FINDINGS: Left pacer remains in place, unchanged. Prior CABG. Cardiomegaly, vascular congestion. Irregular density projecting over the right upper lobe corresponds to the calcified subcapsular mass seen on prior CT and is unchanged. Mild perihilar interstitial prominence may reflect early interstitial edema. IMPRESSION: Cardiomegaly with vascular congestion. Question early interstitial edema. Electronically Signed   By: Charlett Nose M.D.   On: 12/22/2023 17:19      Labs: BNP (last 3 results) Recent Labs    08/23/23 0948 12/14/23 1146 12/22/23 1555   BNP 1,190.7* 3,946.6* 3,093.2*   Basic Metabolic Panel: Recent Labs  Lab 12/22/23 1555 12/24/23 0540 12/25/23 0424 12/26/23 0614 12/27/23 0658  NA 140 139 140 138 135  K 3.7 3.2* 3.5 3.8 4.2  CL 104 104 103 104 102  CO2 27 26 26 25 24   GLUCOSE 120* 106* 77 75 132*  BUN 38* 32* 29* 27* 34*  CREATININE 2.14* 1.77* 1.73* 1.85* 1.98*  CALCIUM 8.0* 7.8* 7.7* 7.9* 7.8*  MG  --  1.7 1.8 1.7 1.8   Liver Function Tests: No results for input(s): "AST", "ALT", "ALKPHOS", "BILITOT", "PROT", "ALBUMIN" in the last 168 hours. No results for input(s): "LIPASE", "AMYLASE" in the last 168 hours. No results for input(s): "AMMONIA" in the last 168 hours. CBC: Recent Labs  Lab 12/22/23 1555 12/24/23 0540 12/25/23 0424 12/26/23 0614  WBC 4.8 4.8 7.1 9.5  HGB 14.7 14.4 14.2 14.2  HCT 46.9 46.0 43.9 44.6  MCV 87.0 86.5 84.7 84.5  PLT 110* 108* 126* 122*   Cardiac Enzymes: No results for input(s): "CKTOTAL", "CKMB", "CKMBINDEX", "TROPONINI" in the last 168 hours. BNP: Invalid input(s): "POCBNP" CBG: Recent Labs  Lab 12/26/23 1148 12/26/23 1721 12/26/23 2057 12/27/23 0902 12/27/23 1217  GLUCAP 74 167* 198* 191* 176*   D-Dimer No results for input(s): "DDIMER" in the last 72 hours. Hgb A1c No results for input(s): "HGBA1C" in the last 72 hours. Lipid Profile No results for input(s): "CHOL", "HDL", "LDLCALC", "TRIG", "CHOLHDL", "LDLDIRECT" in the last 72 hours. Thyroid function studies No results for input(s): "TSH", "T4TOTAL", "T3FREE", "THYROIDAB" in the last 72 hours.  Invalid input(s): "FREET3" Anemia work up  No results for input(s): "VITAMINB12", "FOLATE", "FERRITIN", "TIBC", "IRON", "RETICCTPCT" in the last 72 hours. Urinalysis    Component Value Date/Time   COLORURINE YELLOW 09/01/2020 2035   APPEARANCEUR HAZY (A) 09/01/2020 2035   LABSPEC 1.010 09/01/2020 2035   PHURINE 5.0 09/01/2020 2035   GLUCOSEU NEGATIVE 09/01/2020 2035   HGBUR MODERATE (A) 09/01/2020 2035    BILIRUBINUR NEGATIVE 09/01/2020 2035   KETONESUR NEGATIVE 09/01/2020 2035   PROTEINUR NEGATIVE 09/01/2020 2035   NITRITE NEGATIVE 09/01/2020 2035   LEUKOCYTESUR TRACE (A) 09/01/2020 2035   Sepsis Labs Recent Labs  Lab 12/22/23 1555 12/24/23 0540 12/25/23 0424 12/26/23 0614  WBC 4.8 4.8 7.1 9.5   Microbiology Recent Results (from the past 240 hours)  Resp panel by RT-PCR (RSV, Flu A&B, Covid) Anterior Nasal Swab     Status: Abnormal   Collection Time: 12/22/23  5:50 PM   Specimen: Anterior Nasal Swab  Result Value Ref Range Status   SARS Coronavirus 2 by RT PCR POSITIVE (A) NEGATIVE Final    Comment: (NOTE) SARS-CoV-2 target nucleic acids are DETECTED.  The SARS-CoV-2 RNA is generally detectable in upper respiratory specimens during the acute phase of infection. Positive results are indicative of the presence of the identified virus, but do not rule out bacterial infection or co-infection with other pathogens not detected by the test. Clinical correlation with patient history and other diagnostic information is necessary to determine patient infection status. The expected result is Negative.  Fact Sheet for Patients: BloggerCourse.com  Fact Sheet for Healthcare Providers: SeriousBroker.it  This test is not yet approved or cleared by the [place] FDA and  has been authorized for detection and/or diagnosis of SARS-CoV-2 by FDA under an Emergency Use Authorization (EUA).  This EUA will remain in effect (meaning this test can be used) for the duration of  the COVID-19 declaration under Section 564(b)(1) of the A ct, 21 U.S.C. section 360bbb-3(b)(1), unless the authorization is terminated or revoked sooner.     Influenza A by PCR NEGATIVE NEGATIVE Final   Influenza B by PCR NEGATIVE NEGATIVE Final    Comment: (NOTE) The Xpert Xpress SARS-CoV-2/FLU/RSV plus assay is intended as an aid in the diagnosis of influenza from  Nasopharyngeal swab specimens and should not be used as a sole basis for treatment. Nasal washings and aspirates are unacceptable for Xpert Xpress SARS-CoV-2/FLU/RSV testing.  Fact Sheet for Patients: BloggerCourse.com  Fact Sheet for Healthcare Providers: SeriousBroker.it  This test is not yet approved or cleared by the [place] FDA and has been authorized for detection and/or diagnosis of SARS-CoV-2 by FDA under an Emergency Use Authorization (EUA). This EUA will remain in effect (meaning this test can be used) for the duration of the COVID-19 declaration under Section 564(b)(1) of the Act, 21 U.S.C. section 360bbb-3(b)(1), unless the authorization is terminated or revoked.     Resp Syncytial Virus by PCR NEGATIVE NEGATIVE Final    Comment: (NOTE) Fact Sheet for Patients: BloggerCourse.com  Fact Sheet for Healthcare Providers: SeriousBroker.it  This test is not yet approved or cleared by the [place] FDA and has been authorized for detection and/or diagnosis of SARS-CoV-2 by FDA under an Emergency Use Authorization (EUA). This EUA will remain in effect (meaning this test can be used) for the duration of the COVID-19 declaration under Section 564(b)(1) of the Act, 21 U.S.C. section 360bbb-3(b)(1), unless the authorization is terminated or revoked.  Performed at [facility], [address], [place], [place] [address]  Total time spend on discharging this patient, including the last patient exam, discussing the hospital stay, instructions for ongoing care as it relates to all pertinent caregivers, as well as preparing the medical discharge records, prescriptions, and/or referrals as applicable, is 40 minutes.    [name], MD  [facility] 12/27/2023, 1:20 PM

## 2023-12-27 NOTE — Progress Notes (Addendum)
Advanced Heart Failure Rounding Note  Cardiologist: Lorine Bears, MD   Chief Complaint: Acute on chronic systolic CHF  Subjective:    Continues to improve each day but still has a cough and fatigue.   Objective:   Weight Range: 96.8 kg Body mass index is 32.45 kg/m.   Vital Signs:   Temp:  [97.7 F (36.5 C)-99.2 F (37.3 C)] 98.5 F (36.9 C) (02/18 0530) Pulse Rate:  [64-71] 71 (02/18 0530) Resp:  [17-20] 17 (02/18 0530) BP: (102-115)/(50-71) 103/50 (02/18 0530) SpO2:  [93 %-100 %] 96 % (02/18 0530) Weight:  [96.8 kg] 96.8 kg (02/18 0500) Last BM Date : 12/26/23  Weight change: Filed Weights   12/24/23 0500 12/26/23 0600 12/27/23 0500  Weight: 101.3 kg 93.5 kg 96.8 kg    Intake/Output:   Intake/Output Summary (Last 24 hours) at 12/27/2023 4098 Last data filed at 12/27/2023 0500 Gross per 24 hour  Intake 240 ml  Output 700 ml  Net -460 ml      Physical Exam    General: Comfortable at rest. Sitting up in bed Neck: JVP not elevated Cor:  Regular rate & rhythm. No rubs, gallops or murmurs. Lungs: Clear Abdomen: Soft, nontender, nondistended.  Extremities: No cyanosis, clubbing, rash, trace edema Neuro: Alert & orientedx3. Affect pleasant   Telemetry   AV paced 60s, occasional PVCs  Labs    CBC Recent Labs    12/25/23 0424 12/26/23 0614  WBC 7.1 9.5  HGB 14.2 14.2  HCT 43.9 44.6  MCV 84.7 84.5  PLT 126* 122*   Basic Metabolic Panel Recent Labs    11/91/47 0424 12/26/23 0614  NA 140 138  K 3.5 3.8  CL 103 104  CO2 26 25  GLUCOSE 77 75  BUN 29* 27*  CREATININE 1.73* 1.85*  CALCIUM 7.7* 7.9*  MG 1.8 1.7   Liver Function Tests No results for input(s): "AST", "ALT", "ALKPHOS", "BILITOT", "PROT", "ALBUMIN" in the last 72 hours. No results for input(s): "LIPASE", "AMYLASE" in the last 72 hours. Cardiac Enzymes No results for input(s): "CKTOTAL", "CKMB", "CKMBINDEX", "TROPONINI" in the last 72 hours.  BNP: BNP (last 3  results) Recent Labs    08/23/23 0948 12/14/23 1146 12/22/23 1555  BNP 1,190.7* 3,946.6* 3,093.2*    ProBNP (last 3 results) Recent Labs    08/09/23 1058  PROBNP 6,649*     D-Dimer No results for input(s): "DDIMER" in the last 72 hours. Hemoglobin A1C No results for input(s): "HGBA1C" in the last 72 hours. Fasting Lipid Panel No results for input(s): "CHOL", "HDL", "LDLCALC", "TRIG", "CHOLHDL", "LDLDIRECT" in the last 72 hours. Thyroid Function Tests No results for input(s): "TSH", "T4TOTAL", "T3FREE", "THYROIDAB" in the last 72 hours.  Invalid input(s): "FREET3"  Other results:   Imaging    No results found.   Medications:     Scheduled Medications:  apixaban  2.5 mg Oral BID   carvedilol  6.25 mg Oral BID WC   ezetimibe  10 mg Oral Daily   guaiFENesin  1,200 mg Oral BID   insulin aspart  0-15 Units Subcutaneous TID WC   insulin aspart  0-5 Units Subcutaneous QHS   insulin glargine-yfgn  15 Units Subcutaneous Q2200   ipratropium-albuterol  3 mL Nebulization BID   potassium chloride SA  20 mEq Oral Daily   rosuvastatin  40 mg Oral Daily   sacubitril-valsartan  1 tablet Oral BID   sodium chloride HYPERTONIC  4 mL Nebulization BID   spironolactone  25  mg Oral Daily   tamsulosin  0.4 mg Oral Daily   torsemide  40 mg Oral Daily    Infusions:   PRN Medications: acetaminophen **OR** acetaminophen, albuterol, HYDROcodone-acetaminophen, ondansetron **OR** ondansetron (ZOFRAN) IV   Assessment/Plan  1. Acute on chronic systolic CHF:  - Ischemic cardiomyopathy.  [employer]ott CRT-P device. Echo in 2/24 with EF 30-35%, moderate LVH. - Echo this admit with EF 25-30%, RV dysfunction, mild to moderately elevated right heart pressures   - Volume looks good today. Scr and BUN starting to creep up, decrease Torsemide to 20 mg daily - Continue Coreg 6.25 mg bid - Continue Entresto 24/26 mg BID (not taking 97/13 mg BID at home as on medication list) - Restart Farxiga 10  mg daily. Uncertain why it was discontinued this admission - Continue spiro 25 daily - Stopped bidil, can titrate Entresto as needed - Has f/u in HF clinic scheduled   2. Acute hypoxic respiratory failure - likely combination of COVID PNA and HF - improved with diuresis - Currently stable on room air   3. CAD: s/p CABG.  Cath in 7/24 with patent grafts with stable moderate stenosis in SVG-PDA managed medically.    - He is on Eliquis so no aspirin.  - Continue Crestor. - No angina.   4. Atrial fibrillation: Paroxysmal.   - Continue Eliquis 2.5 mg bid, lower dose appropriate given age > 80 and Scr > 1.5 - SR this admission - No current bleeding   5.  Aki on CKD stage 3b:  - baseline Scr 1.6-1.9. Likely cardiorenal - Scr up slightly 1.73>1.98 last couple of days. Decrease diuretic as above   6. Hypokalemia - resolved  Seems quite weak today. Needed a lot of assistance to reposition in bed. PT/OT consults placed.  Length of Stay: 5  Lisaann Atha N, PA-C  12/27/2023, 7:42 AM  Advanced Heart Failure Team Pager [phone] (M-F; 7a - 5p)  Please contact [facility] for night-coverage after hours (5p -7a ) and weekends on [email or web address]

## 2024-01-09 ENCOUNTER — Telehealth: Payer: Self-pay | Admitting: Cardiology

## 2024-01-09 NOTE — Telephone Encounter (Signed)
 Lvm to confirm appt for 01/10/24

## 2024-01-09 NOTE — Telephone Encounter (Signed)
 Patient called to reschedule his appointment for tomorrow

## 2024-01-10 ENCOUNTER — Encounter: Payer: Medicare HMO | Admitting: Cardiology

## 2024-01-10 ENCOUNTER — Ambulatory Visit (INDEPENDENT_AMBULATORY_CARE_PROVIDER_SITE_OTHER): Payer: Medicaid Other

## 2024-01-10 DIAGNOSIS — I453 Trifascicular block: Secondary | ICD-10-CM

## 2024-01-10 DIAGNOSIS — I255 Ischemic cardiomyopathy: Secondary | ICD-10-CM

## 2024-01-11 LAB — CUP PACEART REMOTE DEVICE CHECK
Battery Remaining Longevity: 55 mo
Battery Remaining Percentage: 63 %
Battery Voltage: 2.98 V
Brady Statistic AP VP Percent: 66 %
Brady Statistic AP VS Percent: 1 %
Brady Statistic AS VP Percent: 32 %
Brady Statistic AS VS Percent: 1 %
Brady Statistic RA Percent Paced: 64 %
Date Time Interrogation Session: 20250304033157
Implantable Lead Connection Status: 753985
Implantable Lead Connection Status: 753985
Implantable Lead Connection Status: 753985
Implantable Lead Implant Date: 20221205
Implantable Lead Implant Date: 20221205
Implantable Lead Implant Date: 20221205
Implantable Lead Location: 753858
Implantable Lead Location: 753859
Implantable Lead Location: 753860
Implantable Lead Model: 3830
Implantable Pulse Generator Implant Date: 20221205
Lead Channel Impedance Value: 410 Ohm
Lead Channel Impedance Value: 410 Ohm
Lead Channel Impedance Value: 460 Ohm
Lead Channel Pacing Threshold Amplitude: 0.625 V
Lead Channel Pacing Threshold Amplitude: 0.75 V
Lead Channel Pacing Threshold Amplitude: 1.625 V
Lead Channel Pacing Threshold Pulse Width: 0.5 ms
Lead Channel Pacing Threshold Pulse Width: 0.5 ms
Lead Channel Pacing Threshold Pulse Width: 0.5 ms
Lead Channel Sensing Intrinsic Amplitude: 12 mV
Lead Channel Sensing Intrinsic Amplitude: 2.4 mV
Lead Channel Setting Pacing Amplitude: 2 V
Lead Channel Setting Pacing Amplitude: 2 V
Lead Channel Setting Pacing Amplitude: 3.125
Lead Channel Setting Pacing Pulse Width: 0.5 ms
Lead Channel Setting Pacing Pulse Width: 0.5 ms
Lead Channel Setting Sensing Sensitivity: 2 mV
Pulse Gen Model: 3222
Pulse Gen Serial Number: 3901949

## 2024-01-24 ENCOUNTER — Telehealth: Payer: Self-pay | Admitting: Cardiology

## 2024-01-25 ENCOUNTER — Ambulatory Visit: Attending: Cardiology | Admitting: Cardiology

## 2024-01-25 ENCOUNTER — Encounter: Payer: Self-pay | Admitting: Cardiology

## 2024-01-25 VITALS — BP 134/76 | HR 61 | Wt 211.2 lb

## 2024-01-25 DIAGNOSIS — I5022 Chronic systolic (congestive) heart failure: Secondary | ICD-10-CM

## 2024-01-25 MED ORDER — ENTRESTO 49-51 MG PO TABS: 1.0000 | ORAL_TABLET | Freq: Two times a day (BID) | ORAL | 11 refills | Status: DC

## 2024-01-25 NOTE — Patient Instructions (Signed)
 STOP Imdur and Hydralazine  INCREASE Entresto to 49/51 mg Twice daily  Go over to the [facility]. Go pass the gift shop and have your blood work completed in 2 weeks.  We will only call you if the results are abnormal or if the provider would like to make medication changes.  Your physician recommends that you schedule a follow-up appointment in: 3 months.  At the [facility], you and your health needs are our priority. As part of our continuing mission to provide you with exceptional heart care, we have created designated Provider Care Teams. These Care Teams include your primary Cardiologist (physician) and Advanced Practice Providers (APPs- Physician Assistants and Nurse Practitioners) who all work together to provide you with the care you need, when you need it.   You may see any of the following providers on your designated Care Team at your next follow up: Dr Arvilla Meres Dr Marca Ancona Dr. Dorthula Nettles Dr. Clearnce Hasten Amy Filbert Schilder, NP Robbie Lis, [place] [facility] [place], [place] Brynda Peon, NP [place] Lee, NP Clarisa Kindred, NP Karle Plumber, PharmD Enos Fling, PharmD   Please be sure to bring in all your medications bottles to every appointment.    Thank you for choosing [facility]
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
## 2024-02-14 NOTE — Addendum Note (Signed)
 Addended by: Geralyn Flash D on: 02/14/2024 12:39 PM   Modules accepted: Orders

## 2024-02-14 NOTE — Progress Notes (Signed)
 Remote pacemaker transmission.

## 2024-02-17 ENCOUNTER — Other Ambulatory Visit: Payer: Self-pay | Admitting: Nurse Practitioner

## 2024-02-17 DIAGNOSIS — I2510 Atherosclerotic heart disease of native coronary artery without angina pectoris: Secondary | ICD-10-CM

## 2024-02-21 ENCOUNTER — Encounter: Payer: Self-pay | Admitting: Internal Medicine

## 2024-02-23 ENCOUNTER — Telehealth: Payer: Self-pay | Admitting: Cardiovascular Disease

## 2024-02-23 NOTE — Telephone Encounter (Signed)
 Spoke with patient. Patient states that his swelling is baseline for him and that he does not feel bad. Patient state that he is unsure why the his physical therapist called. Offered to schedule an appointment. Patient state that he does not need to be seen. Patient encouraged to call the office if he has any new or worsening symptoms.

## 2024-02-23 NOTE — Telephone Encounter (Signed)
 Called patient and left message for call back.

## 2024-02-23 NOTE — Telephone Encounter (Signed)
 Soni, physical therapist from [facility], calling with report- sob w/ very little activity bi late edema around ankles Can reach out to her if needed or call pt

## 2024-03-22 ENCOUNTER — Encounter: Payer: Self-pay | Admitting: Podiatry

## 2024-03-22 ENCOUNTER — Ambulatory Visit (INDEPENDENT_AMBULATORY_CARE_PROVIDER_SITE_OTHER): Payer: Medicare HMO | Admitting: Podiatry

## 2024-03-22 DIAGNOSIS — M79674 Pain in right toe(s): Secondary | ICD-10-CM

## 2024-03-22 DIAGNOSIS — M79675 Pain in left toe(s): Secondary | ICD-10-CM | POA: Diagnosis not present

## 2024-03-22 DIAGNOSIS — B351 Tinea unguium: Secondary | ICD-10-CM | POA: Diagnosis not present

## 2024-03-22 DIAGNOSIS — M2142 Flat foot [pes planus] (acquired), left foot: Secondary | ICD-10-CM | POA: Diagnosis not present

## 2024-03-22 DIAGNOSIS — E1151 Type 2 diabetes mellitus with diabetic peripheral angiopathy without gangrene: Secondary | ICD-10-CM | POA: Diagnosis not present

## 2024-03-22 DIAGNOSIS — M2141 Flat foot [pes planus] (acquired), right foot: Secondary | ICD-10-CM | POA: Diagnosis not present

## 2024-03-22 NOTE — Progress Notes (Signed)
 Subjective:  Patient ID: [name], male    DOB: 07-20-1935,  MRN: [id number]  [name] presents to clinic today for at risk foot care. Pt has h/o NIDDM with chronic kidney disease and painful mycotic toenails x 10 which interfere with daily activities. Pain is relieved with periodic professional debridement.  Chief Complaint  Patient presents with   Diabetes    "I need my nails cut and I want to get some Diabetic shoes."   New problem(s): None. Patient is requesting new diabetic shoes.  PCP is Herlinda Long, MD.  Allergies  Allergen Reactions   Angiotensin Receptor Blockers     hyperkalemia   Spironolactone      Hyperkalemia Pt states he is not allergic   Metformin Diarrhea    Review of Systems: Negative except as noted in the HPI.  Objective: No changes noted in today's physical examination. There were no vitals filed for this visit. [name] is a pleasant 89 y.o. male obese in NAD. AAO x 3.  Vascular Examination: CFT <3 seconds b/l. DP pulses faintly palpable b/l. PT pulses nonpalpable b/l. Digital hair absent. Skin temperature gradient warm to warm b/l. No pain with calf compression. No ischemia or gangrene. No cyanosis or clubbing noted b/l. +1 pitting edema noted BLE.   Neurological Examination: Sensation grossly intact b/l with 10 gram monofilament. Vibratory sensation intact b/l.   Dermatological Examination: Pedal skin warm and supple b/l. No open wounds b/l. No interdigital macerations. Toenails 1-5 b/l thick, discolored, elongated with subungual debris and pain on dorsal palpation.  No hyperkeratotic nor porokeratotic lesions present on today's visit.  Musculoskeletal Examination: Muscle strength 5/5 to all lower extremity muscle groups bilaterally. Pes planus deformity noted bilateral LE. Utilizes cane for ambulation assistance.  Radiographs: None  Last HgA1c:      Latest Ref Rng & Units 12/23/2023    2:46 AM 06/03/2023    5:44 AM  Hemoglobin  A1C  Hemoglobin-A1c 4.8 - 5.6 % 7.5  7.0     Assessment/Plan: 1. Pain due to onychomycosis of toenails of both feet   2. Pes planus of both feet   3. Type II diabetes mellitus with peripheral circulatory disorder (HCC)     Orders Placed This Encounter  Procedures   For Home Use Only DME Diabetic Shoe    To [facility]: Dispense one pair extra depth shoes and 3 pair custom insoles.   FOR HOME USE ONLY DME DIABETIC SHOE Patient was evaluated and treated. All patient's and/or POA's questions/concerns addressed on today's visit. Mycotic toenails 1-5 debrided in length and girth without incident.  Continue daily foot inspections and monitor blood glucose per PCP/Endocrinologist's recommendations.Continue soft, supportive shoe gear daily. Report any pedal injuries to medical professional. Call office if there are any quesitons/concerns. -Rx sent to [facility] for one pair diabetic shoes and 3 pair custom insoles. To [facility] [address] [place], [place] [address] Phone: [phone] Fax: [phone].  Return in about 3 months (around 06/22/2024).  [name], DPM      [facility] [facility] LOCATION: [address], [place] [address]  Office [phone]   [facility] LOCATION: [address] [place], [place] [address] Office [phone]

## 2024-03-23 ENCOUNTER — Other Ambulatory Visit: Payer: Self-pay

## 2024-03-23 DIAGNOSIS — I2510 Atherosclerotic heart disease of native coronary artery without angina pectoris: Secondary | ICD-10-CM | POA: Diagnosis present

## 2024-03-23 DIAGNOSIS — E669 Obesity, unspecified: Secondary | ICD-10-CM | POA: Diagnosis present

## 2024-03-23 DIAGNOSIS — Z955 Presence of coronary angioplasty implant and graft: Secondary | ICD-10-CM

## 2024-03-23 DIAGNOSIS — Z6830 Body mass index (BMI) 30.0-30.9, adult: Secondary | ICD-10-CM

## 2024-03-23 DIAGNOSIS — N390 Urinary tract infection, site not specified: Secondary | ICD-10-CM | POA: Diagnosis present

## 2024-03-23 DIAGNOSIS — Z950 Presence of cardiac pacemaker: Secondary | ICD-10-CM

## 2024-03-23 DIAGNOSIS — E11649 Type 2 diabetes mellitus with hypoglycemia without coma: Secondary | ICD-10-CM | POA: Diagnosis not present

## 2024-03-23 DIAGNOSIS — N179 Acute kidney failure, unspecified: Secondary | ICD-10-CM | POA: Diagnosis present

## 2024-03-23 DIAGNOSIS — N433 Hydrocele, unspecified: Secondary | ICD-10-CM | POA: Diagnosis present

## 2024-03-23 DIAGNOSIS — E1122 Type 2 diabetes mellitus with diabetic chronic kidney disease: Secondary | ICD-10-CM | POA: Diagnosis present

## 2024-03-23 DIAGNOSIS — Z951 Presence of aortocoronary bypass graft: Secondary | ICD-10-CM

## 2024-03-23 DIAGNOSIS — I480 Paroxysmal atrial fibrillation: Secondary | ICD-10-CM | POA: Diagnosis present

## 2024-03-23 DIAGNOSIS — I861 Scrotal varices: Secondary | ICD-10-CM | POA: Diagnosis present

## 2024-03-23 DIAGNOSIS — E876 Hypokalemia: Secondary | ICD-10-CM | POA: Diagnosis present

## 2024-03-23 DIAGNOSIS — I5023 Acute on chronic systolic (congestive) heart failure: Secondary | ICD-10-CM | POA: Diagnosis not present

## 2024-03-23 DIAGNOSIS — Z7901 Long term (current) use of anticoagulants: Secondary | ICD-10-CM

## 2024-03-23 DIAGNOSIS — E785 Hyperlipidemia, unspecified: Secondary | ICD-10-CM | POA: Diagnosis present

## 2024-03-23 DIAGNOSIS — Z87891 Personal history of nicotine dependence: Secondary | ICD-10-CM

## 2024-03-23 DIAGNOSIS — Z794 Long term (current) use of insulin: Secondary | ICD-10-CM

## 2024-03-23 DIAGNOSIS — I130 Hypertensive heart and chronic kidney disease with heart failure and stage 1 through stage 4 chronic kidney disease, or unspecified chronic kidney disease: Secondary | ICD-10-CM | POA: Diagnosis not present

## 2024-03-23 DIAGNOSIS — I255 Ischemic cardiomyopathy: Secondary | ICD-10-CM | POA: Diagnosis present

## 2024-03-23 DIAGNOSIS — N1832 Chronic kidney disease, stage 3b: Secondary | ICD-10-CM | POA: Diagnosis present

## 2024-03-23 DIAGNOSIS — Z8674 Personal history of sudden cardiac arrest: Secondary | ICD-10-CM

## 2024-03-23 LAB — COMPREHENSIVE METABOLIC PANEL WITH GFR
ALT: 10 U/L (ref 0–44)
AST: 20 U/L (ref 15–41)
Albumin: 3.1 g/dL — ABNORMAL LOW (ref 3.5–5.0)
Alkaline Phosphatase: 60 U/L (ref 38–126)
Anion gap: 14 (ref 5–15)
BUN: 48 mg/dL — ABNORMAL HIGH (ref 8–23)
CO2: 23 mmol/L (ref 22–32)
Calcium: 7.1 mg/dL — ABNORMAL LOW (ref 8.9–10.3)
Chloride: 104 mmol/L (ref 98–111)
Creatinine, Ser: 2.55 mg/dL — ABNORMAL HIGH (ref 0.61–1.24)
GFR, Estimated: 24 mL/min — ABNORMAL LOW (ref >60)
Glucose, Bld: 143 mg/dL — ABNORMAL HIGH (ref 70–99)
Potassium: 2.9 mmol/L — ABNORMAL LOW (ref 3.5–5.1)
Sodium: 141 mmol/L (ref 135–145)
Total Bilirubin: 0.8 mg/dL (ref 0.0–1.2)
Total Protein: 6.6 g/dL (ref 6.5–8.1)

## 2024-03-23 LAB — CBC WITH DIFFERENTIAL/PLATELET
Abs Immature Granulocytes: 0.02 10*3/uL (ref 0.00–0.07)
Basophils Absolute: 0 10*3/uL (ref 0.0–0.1)
Basophils Relative: 1 %
Eosinophils Absolute: 0.1 10*3/uL (ref 0.0–0.5)
Eosinophils Relative: 2 %
HCT: 41.2 % (ref 39.0–52.0)
Hemoglobin: 12.7 g/dL — ABNORMAL LOW (ref 13.0–17.0)
Immature Granulocytes: 0 %
Lymphocytes Relative: 15 %
Lymphs Abs: 0.8 10*3/uL (ref 0.7–4.0)
MCH: 28.5 pg (ref 26.0–34.0)
MCHC: 30.8 g/dL (ref 30.0–36.0)
MCV: 92.6 fL (ref 80.0–100.0)
Monocytes Absolute: 0.8 10*3/uL (ref 0.1–1.0)
Monocytes Relative: 14 %
Neutro Abs: 3.7 10*3/uL (ref 1.7–7.7)
Neutrophils Relative %: 68 %
Platelets: 141 10*3/uL — ABNORMAL LOW (ref 150–400)
RBC: 4.45 MIL/uL (ref 4.22–5.81)
RDW: 18.2 % — ABNORMAL HIGH (ref 11.5–15.5)
Smear Review: NORMAL
WBC: 5.5 10*3/uL (ref 4.0–10.5)
nRBC: 0 % (ref 0.0–0.2)

## 2024-03-23 LAB — URINALYSIS, ROUTINE W REFLEX MICROSCOPIC
Bilirubin Urine: NEGATIVE
Glucose, UA: >=500 mg/dL — AB
Ketones, ur: NEGATIVE mg/dL
Nitrite: NEGATIVE
Protein, ur: 100 mg/dL — AB
Specific Gravity, Urine: 1.015 (ref 1.005–1.030)
WBC, UA: >50 WBC/hpf (ref 0–5)
pH: 6 (ref 5.0–8.0)

## 2024-03-23 NOTE — ED Triage Notes (Signed)
 Pt reports he has had scrotal and penis swelling for aprox 1 month, pt reports he is still able to urinate but does have some burning he he pees. Pt reports he was seen at [facility] and told to come here.

## 2024-03-24 ENCOUNTER — Inpatient Hospital Stay
Admission: EM | Admit: 2024-03-24 | Discharge: 2024-03-30 | DRG: 291 | Disposition: A | Discharge status: Home Health | Attending: Obstetrics and Gynecology | Admitting: Obstetrics and Gynecology

## 2024-03-24 ENCOUNTER — Emergency Department

## 2024-03-24 DIAGNOSIS — N179 Acute kidney failure, unspecified: Secondary | ICD-10-CM

## 2024-03-24 DIAGNOSIS — N433 Hydrocele, unspecified: Secondary | ICD-10-CM | POA: Diagnosis present

## 2024-03-24 DIAGNOSIS — I2510 Atherosclerotic heart disease of native coronary artery without angina pectoris: Secondary | ICD-10-CM | POA: Diagnosis present

## 2024-03-24 DIAGNOSIS — Z7901 Long term (current) use of anticoagulants: Secondary | ICD-10-CM | POA: Diagnosis not present

## 2024-03-24 DIAGNOSIS — N5089 Other specified disorders of the male genital organs: Secondary | ICD-10-CM | POA: Diagnosis not present

## 2024-03-24 DIAGNOSIS — Z950 Presence of cardiac pacemaker: Secondary | ICD-10-CM | POA: Diagnosis not present

## 2024-03-24 DIAGNOSIS — I480 Paroxysmal atrial fibrillation: Secondary | ICD-10-CM | POA: Diagnosis present

## 2024-03-24 DIAGNOSIS — Z6830 Body mass index (BMI) 30.0-30.9, adult: Secondary | ICD-10-CM | POA: Diagnosis not present

## 2024-03-24 DIAGNOSIS — Z955 Presence of coronary angioplasty implant and graft: Secondary | ICD-10-CM | POA: Diagnosis not present

## 2024-03-24 DIAGNOSIS — E669 Obesity, unspecified: Secondary | ICD-10-CM | POA: Diagnosis present

## 2024-03-24 DIAGNOSIS — Z87891 Personal history of nicotine dependence: Secondary | ICD-10-CM | POA: Diagnosis not present

## 2024-03-24 DIAGNOSIS — I5023 Acute on chronic systolic (congestive) heart failure: Secondary | ICD-10-CM | POA: Diagnosis present

## 2024-03-24 DIAGNOSIS — I130 Hypertensive heart and chronic kidney disease with heart failure and stage 1 through stage 4 chronic kidney disease, or unspecified chronic kidney disease: Secondary | ICD-10-CM | POA: Diagnosis present

## 2024-03-24 DIAGNOSIS — Z8674 Personal history of sudden cardiac arrest: Secondary | ICD-10-CM | POA: Diagnosis not present

## 2024-03-24 DIAGNOSIS — Z794 Long term (current) use of insulin: Secondary | ICD-10-CM | POA: Diagnosis not present

## 2024-03-24 DIAGNOSIS — I255 Ischemic cardiomyopathy: Secondary | ICD-10-CM | POA: Diagnosis present

## 2024-03-24 DIAGNOSIS — N390 Urinary tract infection, site not specified: Secondary | ICD-10-CM | POA: Diagnosis present

## 2024-03-24 DIAGNOSIS — E876 Hypokalemia: Secondary | ICD-10-CM | POA: Diagnosis present

## 2024-03-24 DIAGNOSIS — E11649 Type 2 diabetes mellitus with hypoglycemia without coma: Secondary | ICD-10-CM | POA: Diagnosis not present

## 2024-03-24 DIAGNOSIS — N1832 Chronic kidney disease, stage 3b: Secondary | ICD-10-CM

## 2024-03-24 DIAGNOSIS — I509 Heart failure, unspecified: Principal | ICD-10-CM

## 2024-03-24 DIAGNOSIS — E785 Hyperlipidemia, unspecified: Secondary | ICD-10-CM | POA: Diagnosis present

## 2024-03-24 DIAGNOSIS — E1122 Type 2 diabetes mellitus with diabetic chronic kidney disease: Secondary | ICD-10-CM | POA: Diagnosis present

## 2024-03-24 DIAGNOSIS — Z951 Presence of aortocoronary bypass graft: Secondary | ICD-10-CM | POA: Diagnosis not present

## 2024-03-24 DIAGNOSIS — I861 Scrotal varices: Secondary | ICD-10-CM | POA: Diagnosis present

## 2024-03-24 LAB — BASIC METABOLIC PANEL WITH GFR
Anion gap: 12 (ref 5–15)
BUN: 52 mg/dL — ABNORMAL HIGH (ref 8–23)
CO2: 22 mmol/L (ref 22–32)
Calcium: 7.1 mg/dL — ABNORMAL LOW (ref 8.9–10.3)
Chloride: 106 mmol/L (ref 98–111)
Creatinine, Ser: 2.36 mg/dL — ABNORMAL HIGH (ref 0.61–1.24)
GFR, Estimated: 26 mL/min — ABNORMAL LOW (ref >60)
Glucose, Bld: 127 mg/dL — ABNORMAL HIGH (ref 70–99)
Potassium: 3.3 mmol/L — ABNORMAL LOW (ref 3.5–5.1)
Sodium: 140 mmol/L (ref 135–145)

## 2024-03-24 LAB — CBG MONITORING, ED
Glucose-Capillary: 123 mg/dL — ABNORMAL HIGH (ref 70–99)
Glucose-Capillary: 147 mg/dL — ABNORMAL HIGH (ref 70–99)
Glucose-Capillary: 117 mg/dL — ABNORMAL HIGH (ref 70–99)
Glucose-Capillary: 118 mg/dL — ABNORMAL HIGH (ref 70–99)

## 2024-03-24 LAB — BRAIN NATRIURETIC PEPTIDE: B Natriuretic Peptide: >4500 pg/mL — ABNORMAL HIGH (ref 0.0–100.0)

## 2024-03-24 MED ORDER — TAMSULOSIN HCL 0.4 MG PO CAPS
0.4000 mg | ORAL_CAPSULE | Freq: Every day | ORAL | Status: DC
  Administered 2024-03-24 – 2024-03-30 (×7): 0.4 mg via ORAL
  Filled 2024-03-24 (×7): qty 1

## 2024-03-24 MED ORDER — CARVEDILOL 6.25 MG PO TABS
6.2500 mg | ORAL_TABLET | Freq: Two times a day (BID) | ORAL | Status: DC
  Administered 2024-03-24 – 2024-03-30 (×13): 6.25 mg via ORAL
  Filled 2024-03-24 (×14): qty 1

## 2024-03-24 MED ORDER — ASPIRIN 81 MG PO TBEC
81.0000 mg | DELAYED_RELEASE_TABLET | Freq: Every day | ORAL | Status: DC
  Administered 2024-03-24 – 2024-03-30 (×7): 81 mg via ORAL
  Filled 2024-03-24 (×7): qty 1

## 2024-03-24 MED ORDER — PANTOPRAZOLE SODIUM 40 MG PO TBEC
40.0000 mg | DELAYED_RELEASE_TABLET | Freq: Every day | ORAL | Status: DC
  Administered 2024-03-24 – 2024-03-30 (×7): 40 mg via ORAL
  Filled 2024-03-24 (×7): qty 1

## 2024-03-24 MED ORDER — INSULIN ASPART 100 UNIT/ML IJ SOLN
0.0000 [IU] | Freq: Three times a day (TID) | INTRAMUSCULAR | Status: DC
  Administered 2024-03-24 – 2024-03-26 (×5): 2 [IU] via SUBCUTANEOUS
  Administered 2024-03-27 (×2): 3 [IU] via SUBCUTANEOUS
  Administered 2024-03-28: 5 [IU] via SUBCUTANEOUS
  Administered 2024-03-29 – 2024-03-30 (×3): 3 [IU] via SUBCUTANEOUS
  Filled 2024-03-24 (×12): qty 1

## 2024-03-24 MED ORDER — ONDANSETRON HCL 4 MG PO TABS: 4.0000 mg | ORAL_TABLET | Freq: Four times a day (QID) | ORAL | Status: DC | PRN

## 2024-03-24 MED ORDER — SODIUM CHLORIDE 0.9 % IV SOLN
2.0000 g | INTRAVENOUS | Status: AC
  Administered 2024-03-24 – 2024-03-25 (×2): 2 g via INTRAVENOUS
  Filled 2024-03-24 (×2): qty 20

## 2024-03-24 MED ORDER — INSULIN ASPART 100 UNIT/ML IJ SOLN: 0.0000 [IU] | Freq: Every day | INTRAMUSCULAR | Status: DC

## 2024-03-24 MED ORDER — ACETAMINOPHEN 650 MG RE SUPP: 650.0000 mg | Freq: Four times a day (QID) | RECTAL | Status: DC | PRN

## 2024-03-24 MED ORDER — SPIRONOLACTONE 25 MG PO TABS
25.0000 mg | ORAL_TABLET | Freq: Every day | ORAL | Status: DC
  Administered 2024-03-24 – 2024-03-29 (×6): 25 mg via ORAL
  Filled 2024-03-24 (×6): qty 1

## 2024-03-24 MED ORDER — FUROSEMIDE 10 MG/ML IJ SOLN
40.0000 mg | Freq: Once | INTRAMUSCULAR | Status: AC
  Administered 2024-03-24: 40 mg via INTRAVENOUS
  Filled 2024-03-24: qty 4

## 2024-03-24 MED ORDER — POTASSIUM CHLORIDE 10 MEQ/100ML IV SOLN
10.0000 meq | INTRAVENOUS | Status: AC
  Administered 2024-03-24 (×2): 10 meq via INTRAVENOUS
  Filled 2024-03-24 (×2): qty 100

## 2024-03-24 MED ORDER — ROSUVASTATIN CALCIUM 10 MG PO TABS
40.0000 mg | ORAL_TABLET | Freq: Every day | ORAL | Status: DC
  Administered 2024-03-24 – 2024-03-30 (×7): 40 mg via ORAL
  Filled 2024-03-24: qty 4
  Filled 2024-03-24: qty 2
  Filled 2024-03-24: qty 4
  Filled 2024-03-24: qty 2
  Filled 2024-03-24 (×3): qty 4

## 2024-03-24 MED ORDER — SODIUM CHLORIDE 0.9 % IV SOLN: 1.0000 g | INTRAVENOUS | Status: DC

## 2024-03-24 MED ORDER — MORPHINE SULFATE (PF) 2 MG/ML IV SOLN
2.0000 mg | INTRAVENOUS | Status: DC | PRN
  Administered 2024-03-27 (×2): 2 mg via INTRAVENOUS
  Filled 2024-03-24 (×2): qty 1

## 2024-03-24 MED ORDER — SODIUM CHLORIDE 0.9 % IV SOLN
2.0000 g | Freq: Once | INTRAVENOUS | Status: AC
  Administered 2024-03-24: 2 g via INTRAVENOUS
  Filled 2024-03-24: qty 20

## 2024-03-24 MED ORDER — OXYCODONE HCL 5 MG PO TABS
5.0000 mg | ORAL_TABLET | ORAL | Status: DC | PRN
  Administered 2024-03-26 – 2024-03-27 (×3): 5 mg via ORAL
  Filled 2024-03-24 (×3): qty 1

## 2024-03-24 MED ORDER — POTASSIUM CHLORIDE CRYS ER 20 MEQ PO TBCR
40.0000 meq | EXTENDED_RELEASE_TABLET | Freq: Once | ORAL | Status: AC
  Administered 2024-03-24: 40 meq via ORAL
  Filled 2024-03-24: qty 2

## 2024-03-24 MED ORDER — ONDANSETRON HCL 4 MG/2ML IJ SOLN
4.0000 mg | Freq: Four times a day (QID) | INTRAMUSCULAR | Status: DC | PRN
  Administered 2024-03-28: 4 mg via INTRAVENOUS
  Filled 2024-03-24: qty 2

## 2024-03-24 MED ORDER — METOLAZONE 2.5 MG PO TABS
2.5000 mg | ORAL_TABLET | ORAL | Status: DC
  Administered 2024-03-27: 2.5 mg via ORAL
  Filled 2024-03-24 (×3): qty 1

## 2024-03-24 MED ORDER — APIXABAN 2.5 MG PO TABS
2.5000 mg | ORAL_TABLET | Freq: Two times a day (BID) | ORAL | Status: DC
  Administered 2024-03-24 – 2024-03-30 (×13): 2.5 mg via ORAL
  Filled 2024-03-24 (×13): qty 1

## 2024-03-24 MED ORDER — DAPAGLIFLOZIN PROPANEDIOL 10 MG PO TABS
10.0000 mg | ORAL_TABLET | Freq: Every day | ORAL | Status: DC
  Administered 2024-03-24 – 2024-03-27 (×4): 10 mg via ORAL
  Filled 2024-03-24 (×4): qty 1

## 2024-03-24 MED ORDER — SACUBITRIL-VALSARTAN 49-51 MG PO TABS
1.0000 | ORAL_TABLET | Freq: Two times a day (BID) | ORAL | Status: DC
  Administered 2024-03-24 – 2024-03-29 (×12): 1 via ORAL
  Filled 2024-03-24 (×12): qty 1

## 2024-03-24 MED ORDER — INSULIN GLARGINE-YFGN 100 UNIT/ML ~~LOC~~ SOLN
15.0000 [IU] | Freq: Every day | SUBCUTANEOUS | Status: DC
  Administered 2024-03-24 – 2024-03-27 (×4): 15 [IU] via SUBCUTANEOUS
  Filled 2024-03-24 (×5): qty 0.15

## 2024-03-24 MED ORDER — FUROSEMIDE 10 MG/ML IJ SOLN
40.0000 mg | Freq: Two times a day (BID) | INTRAMUSCULAR | Status: DC
  Administered 2024-03-24 – 2024-03-26 (×5): 40 mg via INTRAVENOUS
  Filled 2024-03-24 (×6): qty 4

## 2024-03-24 MED ORDER — ACETAMINOPHEN 325 MG PO TABS
650.0000 mg | ORAL_TABLET | Freq: Four times a day (QID) | ORAL | Status: DC | PRN
  Administered 2024-03-25 – 2024-03-30 (×2): 650 mg via ORAL
  Filled 2024-03-24 (×2): qty 2

## 2024-03-24 NOTE — ED Notes (Signed)
 CCMD called to place pt on cardiac monitor per order

## 2024-03-24 NOTE — Progress Notes (Signed)
 [facility]  - [place] at [facility]   PATIENT NAME: [name]    MR#:  [id number]  DATE OF BIRTH:  February 23, 1935  SUBJECTIVE:  patient seen earlier. No family at bedside. Apparently came in with increasing shortness of breath and scrotal swelling. Patient is being evaluated for varicocele/hydrocele left scrotum at [facility]. Found to be NPO 4500. Started on IV Lasix . Poor historian   VITALS:  Blood pressure 123/76, pulse 67, temperature 98.2 F (36.8 C), temperature source Oral, resp. rate 20, height 5\' 8"  (1.727 m), weight 100.2 kg, SpO2 97%.  PHYSICAL EXAMINATION:   GENERAL:  89 y.o.-year-old patient with no acute distress. Obese LUNGS: decreased breath sounds bilaterally, no wheezing CARDIOVASCULAR: S1, S2 normal. No murmur   ABDOMEN: Soft, nontender, nondistended. Scrotal edema++ EXTREMITIES: +++ edema b/l.    NEUROLOGIC: nonfocal  patient is alert and awake SKIN: No obvious rash, lesion, or ulcer.   LABORATORY PANEL:  CBC Recent Labs  Lab 03/23/24 2043  WBC 5.5  HGB 12.7*  HCT 41.2  PLT 141*    Chemistries  Recent Labs  Lab 03/23/24 2043 03/24/24 0514  NA 141 140  K 2.9* 3.3*  CL 104 106  CO2 23 22  GLUCOSE 143* 127*  BUN 48* 52*  CREATININE 2.55* 2.36*  CALCIUM  7.1* 7.1*  AST 20  --   ALT 10  --   ALKPHOS 60  --   BILITOT 0.8  --    Cardiac Enzymes No results for input(s): "TROPONINI" in the last 168 hours. RADIOLOGY:  DG Chest Port 1 View Result Date: 03/24/2024 CLINICAL DATA:  Dyspnea EXAM: PORTABLE CHEST 1 VIEW COMPARISON:  12/22/2023 FINDINGS: Focal pathway within the right mid lung zone appears stable, likely reflecting parenchymal scarring. Lungs are otherwise clear. No pneumothorax or pleural effusion. Stable cardiomegaly. Coronary artery bypass grafting and left atrial clipping has been performed. Left subclavian 3 lead pacemaker is unchanged. Pulmonary vascularity is normal. No acute bone abnormality. IMPRESSION: 1. No active  disease. 2. Stable cardiomegaly. Electronically Signed   By: Worthy Heads M.D.   On: 03/24/2024 01:29    Assessment and Plan  [name] is a 89 y.o. 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  He also has dyspnea on exertion and lower extremity edema.  Has been going up on his home torsemide  without improvement.    BNP over 4500  creatinine 2.55 up from baseline of 1.73,    Acute on chronic HFrEF (heart failure with reduced ejection fraction) (HCC) --IV Lasix  --Continue home carvedilol , Entresto , spironolactone  --Daily weights with intake and output monitoring --EF 25 to 30% on echo 12/2023 --will consult [facility] heart failure team   Acute renal failure superimposed on stage 3b chronic kidney disease (HCC) --Likely secondary to increased torsemide  dose, possible cardiorenal syndrome --Monitor renal function and avoid nephrotoxins   Hypokalemia --Received oral and IV repletion in the ED --Continue to monitor and correct as needed   Scrotal edema Hydrocele, left --Had a recent scrotal ultrasound that showed hydrocele and varicocele.--followed by [facility] -- Patient would like to be seen by urologist while here   Urinary tract infection --Rocephin  --Follow cultures   Pacemaker in place --  No acute issues suspected   CAD s/p CABG, multiple vessel --No complaints of chest pain  --continue Imdur , Crestor , carvedilol  and apixaban    Paroxysmal atrial fibrillation (HCC) --Continue Coreg  and apixaban    DVT prophylaxis: Eliquis  Procedures: Family communication :none today Consults : CODE STATUS: full Level of care:  Progressive Acute on Chronic CHF--pt will stay 3-4 days   TOTAL TIME TAKING CARE OF THIS PATIENT: 45 minutes.  >50% time spent on counselling and coordination of care  Note: This dictation was prepared with Dragon dictation along with smaller phrase technology. Any transcriptional errors that result from this process are unintentional.  Melvinia Stager M.D    [facility]   CC: Primary care physician; Herlinda Long, MD

## 2024-03-24 NOTE — Assessment & Plan Note (Addendum)
 Hydrocele Had a recent scrotal ultrasound that showed hydrocele and varicocele.   Patient would like to be seen by urologist while here

## 2024-03-24 NOTE — Assessment & Plan Note (Signed)
 Rocephin   Follow cultures

## 2024-03-24 NOTE — Assessment & Plan Note (Addendum)
 IV Lasix  Continue home carvedilol , Entresto , spironolactone  Daily weights with intake and output monitoring EF 25 to 30% on echo 12/2023

## 2024-03-24 NOTE — Consult Note (Signed)
 PHARMACY CONSULT NOTE - FOLLOW UP  Pharmacy Consult for Electrolyte Monitoring and Replacement   Recent Labs: Potassium (mmol/L)  Date Value  03/24/2024 3.3 (L)  01/06/2014 3.0 (L)   Magnesium  (mg/dL)  Date Value  88/41/6606 1.8  01/06/2014 0.9 (L)   Calcium  (mg/dL)  Date Value  30/16/0109 7.1 (L)   Calcium , Total (mg/dL)  Date Value  32/35/5732 7.7 (L)   Albumin  (g/dL)  Date Value  20/25/4270 3.1 (L)  01/06/2014 3.5   Phosphorus (mg/dL)  Date Value  62/37/6283 2.5   Sodium (mmol/L)  Date Value  03/24/2024 140  08/09/2023 143  01/06/2014 142     Assessment:  89 y.o. 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 Scr is trending down.   Currently on lasix  IV 40 mg BID + metolazone  weekly, spironolactone , farxiga  and entresto .   Goal of Therapy:  WNL  Plan:  Pt received Kcl 40 mEq x 1 early this morning + 10 mEq IV x 2.  Will give Kcl 40 mEq x 1 this PM.  F/u with AM labs.   Trinidad Funk ,PharmD Clinical Pharmacist 03/24/2024 10:44 AM

## 2024-03-24 NOTE — Care Management Important Message (Signed)
 Important Message  Patient Details  Name: [name] MRN: [id number] Date of Birth: 1935-07-03   Important Message Given:  Yes       Zoe Hinds, RN 03/24/2024, 4:18 PM

## 2024-03-24 NOTE — ED Notes (Signed)
 Answered call light, assisted pt back to the bed from the toilet, collected urine specimen and sent to lab with sunquest temp label.

## 2024-03-24 NOTE — ED Notes (Signed)
 Pt asking for cup of ice, ice provided.

## 2024-03-24 NOTE — Assessment & Plan Note (Signed)
 Likely secondary to increased torsemide  dose, possible cardiorenal syndrome Monitor renal function and avoid nephrotoxins

## 2024-03-24 NOTE — ED Notes (Signed)
 Pt asking to be pulled up in bed, pt repositioned by this RN. Urinal emptied, lunch tray discarded appropriately.

## 2024-03-24 NOTE — Assessment & Plan Note (Signed)
 Received oral and IV repletion in the ED Continue to monitor and correct as needed

## 2024-03-24 NOTE — ED Provider Notes (Signed)
 [facility] Provider Note    Event Date/Time   First MD Initiated Contact with Patient 03/24/24 [phone]     (approximate)   History   Groin Swelling   HPI  [name] is a 89 year old male with history of CAD, HTN, CHF, T2DM presenting with scrotal swelling and shortness of breath.  Patient reports he has had progressive scrotal swelling over the last month, worse over the last week.  He additionally reports worsening shortness of breath most notably over the last week.  He was at his primary care doctor in [place] today, directed to the ER.  I reviewed his outpatient PCP visit from today.  He presented with 1 week of shortness of breath, worsening lower extremity swelling in addition to worsening scrotal swelling.  There was concerns about CHF exacerbation despite adherence to diuretics, so patient was directed to the ER for further evaluation.  I reviewed his visit at [facility] on 03/16/2024.  At that time patient had an ultrasound of his scrotum demonstrating diffuse scrotal wall thickening, edema.  Consideration for cellulitis though this was not noted clinically on exam and was felt to be reactive secondary to patient's large left hydrocele.      Physical Exam   Triage Vital Signs: ED Triage Vitals  Encounter Vitals Group     BP 03/23/24 2039 (!) 121/99     Systolic BP Percentile --      Diastolic BP Percentile --      Pulse Rate 03/23/24 2039 61     Resp 03/23/24 2039 18     Temp 03/23/24 2039 98.8 F (37.1 C)     Temp Source 03/23/24 2039 Oral     SpO2 03/23/24 2039 98 %     Weight 03/23/24 2038 221 lb (100.2 kg)     Height 03/23/24 2038 5\' 8"  (1.727 m)     Head Circumference --      Peak Flow --      Pain Score 03/23/24 2038 0     Pain Loc --      Pain Education --      Exclude from Growth Chart --     Most recent vital signs: Vitals:   03/23/24 2039 03/24/24 0121  BP: (!) 121/99 (!) 146/99  Pulse: 61 84  Resp: 18 17  Temp:  98.8 F (37.1 C) 98.7 F (37.1 C)  SpO2: 98% 97%     General: Awake, interactive  CV:  Regular rate, good peripheral perfusion.  Resp:  Tachypneic with labored respirations on my evaluation, bibasilar crackles noted Abd:  Nondistended, soft, no significant tenderness GU:  Pitting edema along the bilateral scrotum without overlying warmth, erythema Neuro:  Symmetric facial movement, fluid speech MSK:  Bilateral lower extremity pitting edema   ED Results / Procedures / Treatments   Labs (all labs ordered are listed, but only abnormal results are displayed) Labs Reviewed  CBC WITH DIFFERENTIAL/PLATELET - Abnormal; Notable for the following components:      Result Value   Hemoglobin 12.7 (*)    RDW 18.2 (*)    Platelets 141 (*)    All other components within normal limits  COMPREHENSIVE METABOLIC PANEL WITH GFR - Abnormal; Notable for the following components:   Potassium 2.9 (*)    Glucose, Bld 143 (*)    BUN 48 (*)    Creatinine, Ser 2.55 (*)    Calcium  7.1 (*)    Albumin  3.1 (*)    GFR, Estimated  24 (*)    All other components within normal limits  URINALYSIS, ROUTINE W REFLEX MICROSCOPIC - Abnormal; Notable for the following components:   Color, Urine YELLOW (*)    APPearance HAZY (*)    Glucose, UA >=500 (*)    Hgb urine dipstick SMALL (*)    Protein, ur 100 (*)    Leukocytes,Ua LARGE (*)    Bacteria, UA MANY (*)    All other components within normal limits  BRAIN NATRIURETIC PEPTIDE - Abnormal; Notable for the following components:   B Natriuretic Peptide >4,500.0 (*)    All other components within normal limits  URINE CULTURE     EKG EKG independently reviewed interpreted by myself (ER attending) demonstrates:  EKG demonstrates suspected A-fib at a rate of 84, QRS 113, QTc 501, incomplete left bundle branch block  RADIOLOGY Imaging independently reviewed and interpreted by myself demonstrates:  CXR without focal consolidation  Formal Radiology Read:  [facility]  Chest Port 1 View Result Date: 03/24/2024 CLINICAL DATA:  Dyspnea EXAM: PORTABLE CHEST 1 VIEW COMPARISON:  12/22/2023 FINDINGS: Focal pathway within the right mid lung zone appears stable, likely reflecting parenchymal scarring. Lungs are otherwise clear. No pneumothorax or pleural effusion. Stable cardiomegaly. Coronary artery bypass grafting and left atrial clipping has been performed. Left subclavian 3 lead pacemaker is unchanged. Pulmonary vascularity is normal. No acute bone abnormality. IMPRESSION: 1. No active disease. 2. Stable cardiomegaly. Electronically Signed   By: Worthy Heads M.D.   On: 03/24/2024 01:29    PROCEDURES:  Critical Care performed: Yes, see critical care procedure note(s)  CRITICAL CARE Performed by: Claria Crofts   Total critical care time: 31 minutes  Critical care time was exclusive of separately billable procedures and treating other patients.  Critical care was necessary to treat or prevent imminent or life-threatening deterioration.  Critical care was time spent personally by me on the following activities: development of treatment plan with patient and/or surrogate as well as nursing, discussions with consultants, evaluation of patient's response to treatment, examination of patient, obtaining history from patient or surrogate, ordering and performing treatments and interventions, ordering and review of laboratory studies, ordering and review of radiographic studies, pulse oximetry and re-evaluation of patient's condition.   Procedures   MEDICATIONS ORDERED IN ED: Medications  potassium chloride  10 mEq in 100 mL IVPB (10 mEq Intravenous New Bag/Given 03/24/24 0157)  potassium chloride  SA (KLOR-CON  M) CR tablet 40 mEq (40 mEq Oral Given 03/24/24 0111)  cefTRIAXone  (ROCEPHIN ) 2 g in sodium chloride  0.9 % 100 mL IVPB (0 g Intravenous Stopped 03/24/24 0156)  furosemide  (LASIX ) injection 40 mg (40 mg Intravenous Given 03/24/24 0156)     IMPRESSION / MDM /  ASSESSMENT AND PLAN / ED COURSE  I reviewed the triage vital signs and the nursing notes.  Differential diagnosis includes, but is not limited to, CHF exacerbation, scrotal swelling like likely secondary to known hydrocele, possibly worsened in the setting of volume overload, UTI, low suspicion torsion, acute process based on clinical history  Patient's presentation is most consistent with acute presentation with potential threat to life or bodily function.  89 year old male presenting with shortness of breath, scrotal swelling, dysuria.  Stable vitals on presentation.  Sent by PCP with concern for CHF exacerbation.  Labs ordered from triage notable for hypokalemia with K of 2.9 as well as AKI with creatinine of 2.55.  CBC without critical derangements.  Urinalysis is concerning for infection.  Urine culture sent and ordered for Rocephin .  Does not meet SIRS criteria.  Will obtain BNP and chest x-Rohit Deloria to further evaluate.  Potassium repletion ordered.  Given clinical history, may require admission for management of CHF exacerbation in the setting of AKI and hypokalemia.  2:37 AM BNP significantly elevated greater than 4500.  X-Dora Clauss without focal consolidation.  With potassium repletion ongoing, will go ahead and give first dose of diuresis.  Given volume overload despite outpatient management as well as associated electrolyte derangements and kidney injury, do think admission for management of his CHF exacerbation is reasonable.  Will reset hospitalist team.  Clinical Course as of 03/24/24 0237  Sat Mar 24, 2024  0225 Discussed with Dr. Vallarie Gauze.  She will evaluate for anticipated admission. [NR]    Clinical Course User Index [NR] Claria Crofts, MD     FINAL CLINICAL IMPRESSION(S) / ED DIAGNOSES   Final diagnoses:  Acute on chronic congestive heart failure, unspecified heart failure type (HCC)  Scrotal swelling     Rx / DC Orders   ED Discharge Orders     None        Note:  This  document was prepared using Dragon voice recognition software and may include unintentional dictation errors.   Claria Crofts, MD 03/24/24 [phone]

## 2024-03-24 NOTE — H&P (Signed)
 History and Physical    Patient: [name] WUJ:[id number] DOB: November 12, 1934 DOA: 03/24/2024 DOS: the patient was seen and examined on 03/24/2024 PCP: Duane Long, MD  Patient coming from: Home  Chief Complaint:  Chief Complaint  Patient presents with   Groin Swelling    HPI: [name] is a 89 y.o. 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  He also has dyspnea on exertion and lower extremity edema.  Has been going up on his home torsemide  without improvement.  He denies chest pain ED course and data review: BP 121/99 with otherwise normal vitals Labs notable for BNP over 4500 creatinine 2.55 up from baseline of 1.73, potassium 2.9, hemoglobin 12.7, urinalysis with large leuks and many bacteria  EKG, personally reviewed and interpreted showing sinus at 84 with incomplete LBBB  Chest x-ray nonacute  Patient treated with IV Lasix  given IV oral potassium repletion and started on ceftriaxone  for UTI  Hospitalist consulted for admission.     Review of Systems: As mentioned in the history of present illness. All other systems reviewed and are negative.  Past Medical History:  Diagnosis Date   AV block, [place] 1    CAD (coronary artery disease)    a. 08/2019 VF arrest-->sev 3vd on cath-->CABGx5 (LIMA->LAD, VG->OM->LCX, VG->RPDA, VG->Diag); b. 08/2021 Cath: sev native multivessel dzs w/ 5/5 patent grafts. Nl filling pressures->Med rx; c. 05/2023 Cath: stable anatomy->Med rx.   CKD (chronic kidney disease), stage III (HCC)    Diabetes mellitus without complication (HCC)    HFrEF (heart failure with reduced ejection  fraction) (HCC)    a. 08/2020 Echo: EF 40-45%; b. 11/2020 Echo: EF 55%, Gr2 DD; c. 08/2021 Echo: EF 35-40%, glob HK w/ ant/antsept/apical HK; d. 10/2021 s/p CRT-P; e. 12/2021 Echo: EF 30-35%, glob HK, GrIII DD; f. 12/2022 Echo: EF 30-35%, glob HK, sev HK of ant wall, mod asymm LVH, nl RV fxn, mildly dil LA, triv MR, AoV sclerosis.   Hyperlipidemia LDL goal <70    Hypertension    Intermittent Complete heart block (HCC)    a. 08/2021 noted on Zio; b. 10/2021 s/p Abbott Allure RF CRT-P (Ser # [id number]).   Ischemic cardiomyopathy    a. 08/2020 Echo: EF 40-45%; b. 11/2020 Echo: EF 55%; c. 08/2021 Echo: EF 35-40%; d. 10/2021 s/p CRT-P; e. 12/2021 Echo: EF 30-35%, glob HK, GrIII DD.   PAF (paroxysmal atrial fibrillation) (HCC)    a.Medical device check-->Eliquis  (CHA2DS2VASc = 6)   Trifascicular block    Past Surgical History:  Procedure Laterality Date   BACK SURGERY     CARDIAC CATHETERIZATION     CLIPPING OF ATRIAL APPENDAGE N/A 08/29/2020   Procedure: CLIPPING OF ATRIAL APPENDAGE USING ATRICURE 45 MM ATRICLIP FLEX-V;  Surgeon: Norita Beauvais, MD;  Location: [facility] OR;  Service: Open Heart Surgery;  Laterality: N/A;   CORONARY ARTERY BYPASS GRAFT N/A 08/29/2020   Procedure: CORONARY ARTERY BYPASS GRAFTING (CABG) X 5 USING LEFT INTERNAL MAMMARY ARTERY AND ENDOSCOPICALLY HARVESTED RIGHT GREATER SAPHENOUS VEIN. LIMA TO LAD, SVG TO OM SEQ TO CIRC, SVG TO PD, SVG TO DIAG.;  Surgeon: Norita Beauvais, MD;  Location: [facility] OR;  Service: Open Heart Surgery;  Laterality: N/A;   ENDOVEIN HARVEST OF GREATER SAPHENOUS VEIN Right 08/29/2020   Procedure: ENDOVEIN HARVEST OF GREATER SAPHENOUS VEIN;  Surgeon: Norita Beauvais, MD;  Location: [facility] OR;  Service: Open Heart Surgery;  Laterality: Right;   HERNIA REPAIR     LEFT HEART CATH AND CORONARY ANGIOGRAPHY N/A 08/25/2020   Procedure: LEFT HEART CATH AND CORONARY ANGIOGRAPHY;  Surgeon: Wenona Hamilton, MD;  Location: [facility] INVASIVE CV LAB;  Service: Cardiovascular;   Laterality: N/A;   PACEMAKER IMPLANT N/A 10/12/2021   Procedure: PACEMAKER IMPLANT;  Surgeon: Boyce Byes, MD;  Location: [facility] INVASIVE CV LAB;  Service: Cardiovascular;  Laterality: N/A;   RIGHT/LEFT HEART CATH AND CORONARY ANGIOGRAPHY N/A 08/31/2021   Procedure: RIGHT/LEFT HEART CATH AND CORONARY ANGIOGRAPHY;  Surgeon: Wenona Hamilton, MD;  Location: [facility] INVASIVE CV LAB;  Service: Cardiovascular;  Laterality: N/A;   RIGHT/LEFT HEART CATH AND CORONARY/GRAFT ANGIOGRAPHY N/A 06/06/2023   Procedure: RIGHT/LEFT HEART CATH AND CORONARY/GRAFT ANGIOGRAPHY;  Surgeon: Wenona Hamilton, MD;  Location: [facility] INVASIVE CV LAB;  Service: Cardiovascular;  Laterality: N/A;   TEE WITHOUT CARDIOVERSION N/A 08/29/2020   Procedure: TRANSESOPHAGEAL ECHOCARDIOGRAM (TEE);  Surgeon: Norita Beauvais, MD;  Location: [facility] OR;  Service: Open Heart Surgery;  Laterality: N/A;   Social History:  reports that he quit smoking about 27 years ago. His smoking use included cigarettes. He has never used smokeless tobacco. He reports that he does not currently use alcohol. He reports that he does not use drugs.  Allergies  Allergen Reactions   Angiotensin Receptor Blockers     hyperkalemia   Spironolactone      Hyperkalemia Pt states he is not allergic   Metformin Diarrhea    Family History  Problem Relation Age of Onset   Heart attack Mother     Prior to Admission medications   Medication Sig Start Date End Date Taking? Authorizing Provider  acetaminophen  (TYLENOL ) 500 MG tablet Take 1,000 mg by mouth every 6 (six) hours as needed for mild pain, fever or headache.   Yes [provider]  apixaban  (ELIQUIS ) 2.5 MG TABS tablet Take 1 tablet (2.5 mg total) by mouth 2 (two) times daily. 02/08/23  Yes Nathanel Bal, PA-C  carvedilol  (COREG ) 6.25 MG tablet TAKE 1 TABLET BY MOUTH 2 TIMES DAILY WITH A MEAL. 02/21/24  Yes Lauralee Poll, MD  dapagliflozin  propanediol (FARXIGA ) 10 MG TABS tablet Take 1 tablet  (10 mg total) by mouth daily. 12/27/23  Yes Garrison Kanner, MD  ezetimibe  (ZETIA ) 10 MG tablet TAKE 1 TABLET BY MOUTH EVERY DAY 12/23/23  Yes Darlis Eisenmenger, MD  Insulin  Glargine (BASAGLAR  [facility]) 100 UNIT/ML Inject 15 Units into the skin at bedtime. 02/19/24 02/18/25 Yes [provider]  metolazone  (ZAROXOLYN ) 2.5 MG tablet Take 2.5 mg by mouth every 7 (seven) days. (Tuesday) 02/16/24 02/15/25 Yes [provider]  pantoprazole  (PROTONIX ) 40 MG tablet Take 40 mg by mouth daily.   Yes [provider]  polyethylene glycol (MIRALAX  / GLYCOLAX ) 17 g packet Take 17 g by mouth daily as needed for moderate constipation. 12/19/22  Yes Wieting, Richard, MD  potassium chloride  SA (KLOR-CON  M) 20 MEQ tablet Take 1 tablet (20 mEq total) by mouth daily. Reduced from 40 mEq daily. 12/27/23 03/24/24 Yes Garrison Kanner, MD  rosuvastatin  (CRESTOR ) 40 MG tablet Take 1 tablet (40 mg total) by mouth daily. 05/25/23  Yes Hammock, Arlyce Berger, NP  sacubitril -valsartan  (ENTRESTO ) 49-51 MG Take 1 tablet by mouth 2 (two) times  daily. 01/25/24  Yes Lauralee Poll, MD  spironolactone  (ALDACTONE ) 25 MG tablet Take 1 tablet (25 mg total) by mouth daily. 12/27/23 03/24/24 Yes Garrison Kanner, MD  tamsulosin  (FLOMAX ) 0.4 MG CAPS capsule Take 0.4 mg by mouth daily.   Yes [provider]  torsemide  (DEMADEX ) 20 MG tablet Take 1 tablet (20 mg total) by mouth daily. Reduced from 40 mg daily. Patient taking differently: Take 40 mg by mouth daily. 12/27/23  Yes Garrison Kanner, MD  Evolocumab (REPATHA) 140 MG/ML SOSY Inject into the skin. Patient not taking: Reported on 03/24/2024    [provider]  magnesium  oxide (MAG-OX) 400 MG tablet Take 1 tablet (400 mg total) by mouth daily. Patient not taking: Reported on 01/25/2024 01/20/22   Boyce Byes, MD  REPATHA SURECLICK 140 MG/ML SOAJ Inject 140 mg into the skin. Patient not taking: Reported on 03/24/2024    [provider]    Physical Exam: Vitals:    03/23/24 2038 03/23/24 2039 03/24/24 0121  BP:  (!) 121/99 (!) 146/99  Pulse:  61 84  Resp:  18 17  Temp:  98.8 F (37.1 C) 98.7 F (37.1 C)  TempSrc:  Oral Oral  SpO2:  98% 97%  Weight: 100.2 kg    Height: 5\' 8"  (1.727 m)     Physical Exam Vitals and nursing note reviewed.  Constitutional:      General: He is not in acute distress. HENT:     Head: Normocephalic and atraumatic.  Cardiovascular:     Rate and Rhythm: Normal rate and regular rhythm.     Heart sounds: Normal heart sounds.  Pulmonary:     Effort: Pulmonary effort is normal.     Breath sounds: Normal breath sounds.  Abdominal:     Palpations: Abdomen is soft.     Tenderness: There is no abdominal tenderness.  Genitourinary:    Comments: Large scrotal swelling Neurological:     Mental Status: Mental status is at baseline.     Labs on Admission: I have personally reviewed following labs and imaging studies  CBC: Recent Labs  Lab 03/23/24 2043  WBC 5.5  NEUTROABS 3.7  HGB 12.7*  HCT 41.2  MCV 92.6  PLT 141*   Basic Metabolic Panel: Recent Labs  Lab 03/23/24 2043  NA 141  K 2.9*  CL 104  CO2 23  GLUCOSE 143*  BUN 48*  CREATININE 2.55*  CALCIUM  7.1*   GFR: Estimated Creatinine Clearance: 23.9 mL/min (A) (by C-G formula based on SCr of 2.55 mg/dL (H)). Liver Function Tests: Recent Labs  Lab 03/23/24 2043  AST 20  ALT 10  ALKPHOS 60  BILITOT 0.8  PROT 6.6  ALBUMIN  3.1*   No results for input(s): "LIPASE", "AMYLASE" in the last 168 hours. No results for input(s): "AMMONIA" in the last 168 hours. Coagulation Profile: No results for input(s): "INR", "PROTIME" in the last 168 hours. Cardiac Enzymes: No results for input(s): "CKTOTAL", "CKMB", "CKMBINDEX", "TROPONINI" in the last 168 hours. BNP (last 3 results) Recent Labs    08/09/23 1058  PROBNP 6,649*   HbA1C: No results for input(s): "HGBA1C" in the last 72 hours. CBG: No results for input(s): "GLUCAP" in the last 168  hours. Lipid Profile: No results for input(s): "CHOL", "HDL", "LDLCALC", "TRIG", "CHOLHDL", "LDLDIRECT" in the last 72 hours. Thyroid Function Tests: No results for input(s): "TSH", "T4TOTAL", "FREET4", "T3FREE", "THYROIDAB" in the last 72 hours. Anemia Panel: No results for input(s): "VITAMINB12", "FOLATE", "FERRITIN", "TIBC", "IRON", "RETICCTPCT"  in the last 72 hours. Urine analysis:    Component Value Date/Time   COLORURINE YELLOW (A) 03/23/2024 2042   APPEARANCEUR HAZY (A) 03/23/2024 2042   LABSPEC 1.015 03/23/2024 2042   PHURINE 6.0 03/23/2024 2042   GLUCOSEU >=500 (A) 03/23/2024 2042   HGBUR SMALL (A) 03/23/2024 2042   BILIRUBINUR NEGATIVE 03/23/2024 2042   KETONESUR NEGATIVE 03/23/2024 2042   PROTEINUR 100 (A) 03/23/2024 2042   NITRITE NEGATIVE 03/23/2024 2042   LEUKOCYTESUR LARGE (A) 03/23/2024 2042    Radiological Exams on Admission: DG Chest Port 1 View Result Date: 03/24/2024 CLINICAL DATA:  Dyspnea EXAM: PORTABLE CHEST 1 VIEW COMPARISON:  12/22/2023 FINDINGS: Focal pathway within the right mid lung zone appears stable, likely reflecting parenchymal scarring. Lungs are otherwise clear. No pneumothorax or pleural effusion. Stable cardiomegaly. Coronary artery bypass grafting and left atrial clipping has been performed. Left subclavian 3 lead pacemaker is unchanged. Pulmonary vascularity is normal. No acute bone abnormality. IMPRESSION: 1. No active disease. 2. Stable cardiomegaly. Electronically Signed   By: Worthy Heads M.D.   On: 03/24/2024 01:29   Data Reviewed for HPI: Relevant notes from primary care and specialist visits, past discharge summaries as available in EHR, including Care Everywhere. Prior diagnostic testing as pertinent to current admission diagnoses Updated medications and problem lists for reconciliation ED course, including vitals, labs, imaging, treatment and response to treatment Triage notes, nursing and pharmacy notes and ED provider's  notes Notable results as noted above in HPI      Assessment and Plan: * Acute on chronic HFrEF (heart failure with reduced ejection fraction) (HCC) IV Lasix  Continue home carvedilol , Entresto , spironolactone  Daily weights with intake and output monitoring EF 25 to 30% on echo 12/2023  Acute renal failure superimposed on stage 3b chronic kidney disease (HCC) Likely secondary to increased torsemide  dose, possible cardiorenal syndrome Monitor renal function and avoid nephrotoxins  Hypokalemia Received oral and IV repletion in the ED Continue to monitor and correct as needed  Scrotal edema Hydrocele Had a recent scrotal ultrasound that showed hydrocele and varicocele.   Patient would like to be seen by urologist while here  Urinary tract infection Rocephin  Follow cultures  Pacemaker in place No acute issues suspected   CAD s/p CABG, multiple vessel No complaints of chest pain  continue Imdur , Crestor , carvedilol  and apixaban    Paroxysmal atrial fibrillation (HCC) Continue Coreg  and apixaban   DVT prophylaxis: Eliquis   Consults: none  Advance Care Planning:   Code Status: Prior   Family Communication: none  Disposition Plan: Back to previous home environment  Severity of Illness: The appropriate patient status for this patient is OBSERVATION. Observation status is judged to be reasonable and necessary in order to provide the required intensity of service to ensure the patient's safety. The patient's presenting symptoms, physical exam findings, and initial radiographic and laboratory data in the context of their medical condition is felt to place them at decreased risk for further clinical deterioration. Furthermore, it is anticipated that the patient will be medically stable for discharge from the hospital within 2 midnights of admission.   Author: Lanetta Pion, MD 03/24/2024 2:38 AM  For on call review [email or web address].

## 2024-03-24 NOTE — ED Notes (Signed)
 Pt repositioned in bed. Pt placed in gown, pt's belongings (gray tshirt, white undershirt, jeans, belt, wallet, suspenders, hat, pair of brown slippers) placed in 2 belongings bags

## 2024-03-25 DIAGNOSIS — E876 Hypokalemia: Secondary | ICD-10-CM | POA: Diagnosis not present

## 2024-03-25 DIAGNOSIS — I5023 Acute on chronic systolic (congestive) heart failure: Secondary | ICD-10-CM | POA: Diagnosis not present

## 2024-03-25 DIAGNOSIS — N5089 Other specified disorders of the male genital organs: Secondary | ICD-10-CM | POA: Diagnosis not present

## 2024-03-25 DIAGNOSIS — N179 Acute kidney failure, unspecified: Secondary | ICD-10-CM

## 2024-03-25 DIAGNOSIS — N1832 Chronic kidney disease, stage 3b: Secondary | ICD-10-CM

## 2024-03-25 LAB — RENAL FUNCTION PANEL
Albumin: 2.4 g/dL — ABNORMAL LOW (ref 3.5–5.0)
Anion gap: 12 (ref 5–15)
BUN: 48 mg/dL — ABNORMAL HIGH (ref 8–23)
CO2: 26 mmol/L (ref 22–32)
Calcium: 6.8 mg/dL — ABNORMAL LOW (ref 8.9–10.3)
Chloride: 105 mmol/L (ref 98–111)
Creatinine, Ser: 2.22 mg/dL — ABNORMAL HIGH (ref 0.61–1.24)
GFR, Estimated: 28 mL/min — ABNORMAL LOW
Glucose, Bld: 194 mg/dL — ABNORMAL HIGH (ref 70–99)
Phosphorus: 3.1 mg/dL (ref 2.5–4.6)
Potassium: 3.3 mmol/L — ABNORMAL LOW (ref 3.5–5.1)
Sodium: 143 mmol/L (ref 135–145)

## 2024-03-25 LAB — GLUCOSE, CAPILLARY
Glucose-Capillary: 100 mg/dL — ABNORMAL HIGH (ref 70–99)
Glucose-Capillary: 175 mg/dL — ABNORMAL HIGH (ref 70–99)

## 2024-03-25 LAB — CBG MONITORING, ED
Glucose-Capillary: 136 mg/dL — ABNORMAL HIGH (ref 70–99)
Glucose-Capillary: 133 mg/dL — ABNORMAL HIGH (ref 70–99)

## 2024-03-25 LAB — MAGNESIUM: Magnesium: 1.4 mg/dL — ABNORMAL LOW (ref 1.7–2.4)

## 2024-03-25 MED ORDER — ENSURE ENLIVE PO LIQD
237.0000 mL | Freq: Two times a day (BID) | ORAL | Status: DC
  Administered 2024-03-26 – 2024-03-30 (×10): 237 mL via ORAL

## 2024-03-25 MED ORDER — POTASSIUM CHLORIDE CRYS ER 20 MEQ PO TBCR
40.0000 meq | EXTENDED_RELEASE_TABLET | Freq: Once | ORAL | Status: AC
  Administered 2024-03-25: 40 meq via ORAL
  Filled 2024-03-25: qty 2

## 2024-03-25 MED ORDER — MAGNESIUM SULFATE 2 GM/50ML IV SOLN
2.0000 g | Freq: Once | INTRAVENOUS | Status: AC
  Administered 2024-03-25: 2 g via INTRAVENOUS
  Filled 2024-03-25: qty 50

## 2024-03-25 NOTE — Progress Notes (Signed)
 [facility]  - [employer] at [facility]   PATIENT NAME: [name]    MR#:  [id number]  DATE OF BIRTH:  1935-05-22  SUBJECTIVE:  patient seen earlier. No family at bedside. Apparently came in with increasing shortness of breath and scrotal swelling. Patient is being evaluated for varicocele/hydrocele left scrotum at [facility]. BNP 4500. Started on IV Lasix . Poor historian   VITALS:  Blood pressure 106/81, pulse (!) 59, temperature 97.7 F (36.5 C), temperature source Oral, resp. rate 16, height 5\' 8"  (1.727 m), weight 100.2 kg, SpO2 98%.  PHYSICAL EXAMINATION:   GENERAL:  89 y.o.-year-old patient with no acute distress. Obese LUNGS: decreased breath sounds bilaterally, no wheezing CARDIOVASCULAR: S1, S2 normal. No murmur   ABDOMEN: Soft, nontender, nondistended. Scrotal edema++ EXTREMITIES: ++ edema b/l.    NEUROLOGIC: nonfocal  patient is alert and awake SKIN: No obvious rash, lesion, or ulcer.   LABORATORY PANEL:  CBC Recent Labs  Lab 03/23/24 2043  WBC 5.5  HGB 12.7*  HCT 41.2  PLT 141*    Chemistries  Recent Labs  Lab 03/23/24 2043 03/24/24 0514 03/25/24 0448  NA 141   < > 143  K 2.9*   < > 3.3*  CL 104   < > 105  CO2 23   < > 26  GLUCOSE 143*   < > 194*  BUN 48*   < > 48*  CREATININE 2.55*   < > 2.22*  CALCIUM  7.1*   < > 6.8*  MG  --   --  1.4*  AST 20  --   --   ALT 10  --   --   ALKPHOS 60  --   --   BILITOT 0.8  --   --    < > = values in this interval not displayed.   Cardiac Enzymes No results for input(s): "TROPONINI" in the last 168 hours. RADIOLOGY:  DG Chest Port 1 View Result Date: 03/24/2024 CLINICAL DATA:  Dyspnea EXAM: PORTABLE CHEST 1 VIEW COMPARISON:  12/22/2023 FINDINGS: Focal pathway within the right mid lung zone appears stable, likely reflecting parenchymal scarring. Lungs are otherwise clear. No pneumothorax or pleural effusion. Stable cardiomegaly. Coronary artery bypass grafting and left atrial clipping has been  performed. Left subclavian 3 lead pacemaker is unchanged. Pulmonary vascularity is normal. No acute bone abnormality. IMPRESSION: 1. No active disease. 2. Stable cardiomegaly. Electronically Signed   By: Worthy Heads M.D.   On: 03/24/2024 01:29    Assessment and Plan  [name] is a 89 y.o. 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  He also has dyspnea on exertion and lower extremity edema.  Has been going up on his home torsemide  without improvement.    BNP over 4500  creatinine 2.55 up from baseline of 1.73,    Acute on chronic HFrEF (heart failure with reduced ejection fraction) (HCC) --IV Lasix  --Continue home carvedilol , Entresto , spironolactone  --Daily weights with intake and output monitoring --EF 25 to 30% on echo 12/2023 --will consult [facility] heart failure team   Acute renal failure superimposed on stage 3b chronic kidney disease (HCC) --Likely secondary to increased torsemide  dose, possible cardiorenal syndrome --Monitor renal  function and avoid nephrotoxins   Hypokalemia Hypomagnesimia --Received oral and IV repletion in the ED --pharmacy to replace electrolytes   Scrotal edema Hydrocele, left --Had a recent scrotal ultrasound that showed hydrocele and varicocele.--followed by [facility] -- Patient would like to be seen by urologist while here   Urinary tract infection --Rocephin  --Follow cultures   Pacemaker in place --No acute issues suspected   CAD s/p CABG, multiple vessel --No complaints of chest pain  --continue Imdur , Crestor , carvedilol  and apixaban    Paroxysmal atrial fibrillation (HCC) --Continue  Coreg  and apixaban    DVT prophylaxis: Eliquis  Procedures: Family communication :none today Consults : CODE STATUS: full Level of care: Progressive Acute on Chronic CHF--pt will stay 3-4 days   TOTAL TIME TAKING CARE OF THIS PATIENT: 45 minutes.  >50% time spent on counselling and coordination of care  Note: This dictation was prepared with Dragon dictation along with smaller phrase technology. Any transcriptional errors that result from this process are unintentional.  Melvinia Stager M.D    [facility]   CC: Primary care physician; Herlinda Long, MD

## 2024-03-25 NOTE — ED Notes (Signed)
 Advised nurse that patient has ready bed

## 2024-03-25 NOTE — Consult Note (Signed)
 PHARMACY CONSULT NOTE  Pharmacy Consult for Electrolyte Monitoring and Replacement   Recent Labs: Potassium (mmol/L)  Date Value  03/25/2024 3.3 (L)  01/06/2014 3.0 (L)   Magnesium  (mg/dL)  Date Value  91/47/8295 1.4 (L)  01/06/2014 0.9 (L)   Calcium  (mg/dL)  Date Value  62/13/0865 6.8 (L)   Calcium , Total (mg/dL)  Date Value  78/46/9629 7.7 (L)   Albumin  (g/dL)  Date Value  52/84/1324 2.4 (L)  01/06/2014 3.5   Phosphorus (mg/dL)  Date Value  40/08/2724 3.1   Sodium (mmol/L)  Date Value  03/25/2024 143  08/09/2023 143  01/06/2014 142     Assessment:  89 y.o. 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 Scr is trending down.   Currently on lasix  IV 40 mg BID + metolazone  weekly, spironolactone , farxiga  and entresto .   Goal of Therapy:  WNL  Plan:  --K 3.3, Kcl 40 mEq PO x 1 dose --Mg 1.4, magnesium  sulfate 2 g IV x 1 dose --Follow-up BMP, Mg tomorrow  Page Boast 03/25/2024 7:20 AM

## 2024-03-26 DIAGNOSIS — I5023 Acute on chronic systolic (congestive) heart failure: Secondary | ICD-10-CM | POA: Diagnosis not present

## 2024-03-26 LAB — BASIC METABOLIC PANEL WITH GFR
Anion gap: 10 (ref 5–15)
BUN: 44 mg/dL — ABNORMAL HIGH (ref 8–23)
CO2: 26 mmol/L (ref 22–32)
Calcium: 6.8 mg/dL — ABNORMAL LOW (ref 8.9–10.3)
Chloride: 104 mmol/L (ref 98–111)
Creatinine, Ser: 2.16 mg/dL — ABNORMAL HIGH (ref 0.61–1.24)
GFR, Estimated: 29 mL/min — ABNORMAL LOW (ref >60)
Glucose, Bld: 156 mg/dL — ABNORMAL HIGH (ref 70–99)
Potassium: 3.3 mmol/L — ABNORMAL LOW (ref 3.5–5.1)
Sodium: 140 mmol/L (ref 135–145)

## 2024-03-26 LAB — GLUCOSE, CAPILLARY
Glucose-Capillary: 97 mg/dL (ref 70–99)
Glucose-Capillary: 135 mg/dL — ABNORMAL HIGH (ref 70–99)
Glucose-Capillary: 86 mg/dL (ref 70–99)
Glucose-Capillary: 135 mg/dL — ABNORMAL HIGH (ref 70–99)

## 2024-03-26 LAB — MAGNESIUM: Magnesium: 1.6 mg/dL — ABNORMAL LOW (ref 1.7–2.4)

## 2024-03-26 MED ORDER — MAGNESIUM SULFATE 4 GM/100ML IV SOLN
4.0000 g | Freq: Once | INTRAVENOUS | Status: AC
  Administered 2024-03-26: 4 g via INTRAVENOUS
  Filled 2024-03-26: qty 100

## 2024-03-26 MED ORDER — CEFADROXIL 500 MG PO CAPS
500.0000 mg | ORAL_CAPSULE | Freq: Two times a day (BID) | ORAL | Status: DC
  Administered 2024-03-26 – 2024-03-28 (×4): 500 mg via ORAL
  Filled 2024-03-26 (×5): qty 1

## 2024-03-26 MED ORDER — FUROSEMIDE 10 MG/ML IJ SOLN
80.0000 mg | Freq: Two times a day (BID) | INTRAMUSCULAR | Status: DC
Start: 2024-03-26 — End: 2024-03-29
  Administered 2024-03-26 – 2024-03-28 (×5): 80 mg via INTRAVENOUS
  Filled 2024-03-26 (×5): qty 8

## 2024-03-26 MED ORDER — POTASSIUM CHLORIDE CRYS ER 20 MEQ PO TBCR
20.0000 meq | EXTENDED_RELEASE_TABLET | Freq: Every day | ORAL | Status: DC
  Administered 2024-03-27 – 2024-03-29 (×2): 20 meq via ORAL
  Filled 2024-03-26 (×2): qty 1

## 2024-03-26 MED ORDER — POTASSIUM CHLORIDE CRYS ER 20 MEQ PO TBCR
40.0000 meq | EXTENDED_RELEASE_TABLET | Freq: Two times a day (BID) | ORAL | Status: AC
  Administered 2024-03-26 (×2): 40 meq via ORAL
  Filled 2024-03-26 (×2): qty 2

## 2024-03-26 MED ORDER — CEFADROXIL 500 MG PO CAPS: 1000.0000 mg | ORAL_CAPSULE | Freq: Two times a day (BID) | ORAL | Status: DC

## 2024-03-26 NOTE — TOC Progression Note (Signed)
 Transition of Care [facility]) - Progression Note    Patient Details  Name: [name] MRN: [id number] Date of Birth: 07-Mar-1935  [facility] of [facility]) CM/SW Contact  [place] L Cyndel [place], [place] Phone Number: 03/26/2024, 4:29 PM  Clinical Narrative:     CSW spoke with Verlin Glory. Verdis Glade confirmed that pt was receiving OT/PT services up until a month ago. CSW advised that pt will need services again. CSW was advised that orders can be placed and pt will be accepted.        Expected Discharge Plan and Services                                               Social Determinants of Health (SDOH) Interventions SDOH Screenings   Food Insecurity: No Food Insecurity (03/25/2024)  Housing: Low Risk  (03/25/2024)  Transportation Needs: No Transportation Needs (03/25/2024)  Utilities: Not At Risk (03/25/2024)  Depression (PHQ2-9): Low Risk  (04/07/2022)  Financial Resource Strain: Low Risk  (06/29/2021)   Received from [facility]  Physical Activity: Inactive (07/27/2019)  Social Connections: Unknown (03/25/2024)  Stress: No Stress Concern Present (07/27/2019)  Tobacco Use: Medium Risk (03/23/2024)    Readmission Risk Interventions    03/26/2024    4:09 PM 03/10/2023   12:47 PM 03/29/2022    1:18 PM  Readmission Risk Prevention Plan  Transportation Screening Complete Complete Complete  PCP or Specialist Appt within 3-5 Days  Complete Complete  HRI or Home Care Consult   --  Social Work Consult for Recovery Care Planning/Counseling  Complete --  Palliative Care Screening  Not Applicable Not Applicable  Medication Review Oceanographer) Complete Complete Complete  PCP or Specialist appointment within 3-5 days of discharge Complete    HRI or Home Care Consult Complete    SW Recovery Care/Counseling Consult Complete    Palliative Care Screening Not Applicable    Skilled Nursing Facility Not Applicable

## 2024-03-26 NOTE — Progress Notes (Signed)
 Heart Failure Navigator Progress Note  Assessed for Heart & Vascular TOC clinic readiness.  Does not meet criteria due to current Advanced Heart Failure Team patient.   Navigator will sign off at this time.  Celedonio Coil, RN, BSN [facility] Heart Failure Navigator Secure Chat Only

## 2024-03-26 NOTE — Progress Notes (Signed)
 [facility]  - [place] at [facility]   PATIENT NAME: [name]    MR#:  [id number]  DATE OF BIRTH:  11/27/1934  SUBJECTIVE:  patient seen earlier. No family at bedside.  Spoke with dter today Good UOP Improving slowly   VITALS:  Blood pressure 130/71, pulse 65, temperature 98.5 F (36.9 C), temperature source Oral, resp. rate 16, height 5\' 8"  (1.727 m), weight 104.8 kg, SpO2 100%.  PHYSICAL EXAMINATION:   GENERAL:  89 y.o.-year-old patient with no acute distress. Obese LUNGS: decreased breath sounds bilaterally, no wheezing CARDIOVASCULAR: S1, S2 normal. No murmur   ABDOMEN: Soft, nontender, nondistended. Scrotal edema++ EXTREMITIES: ++ edema b/l.    NEUROLOGIC: nonfocal  patient is alert and awake SKIN: No obvious rash, lesion, or ulcer.   LABORATORY PANEL:  CBC Recent Labs  Lab 03/23/24 2043  WBC 5.5  HGB 12.7*  HCT 41.2  PLT 141*    Chemistries  Recent Labs  Lab 03/23/24 2043 03/24/24 0514 03/26/24 0347  NA 141   < > 140  K 2.9*   < > 3.3*  CL 104   < > 104  CO2 23   < > 26  GLUCOSE 143*   < > 156*  BUN 48*   < > 44*  CREATININE 2.55*   < > 2.16*  CALCIUM  7.1*   < > 6.8*  MG  --    < > 1.6*  AST 20  --   --   ALT 10  --   --   ALKPHOS 60  --   --   BILITOT 0.8  --   --    < > = values in this interval not displayed.   Cardiac Enzymes No results for input(s): "TROPONINI" in the last 168 hours. RADIOLOGY:  No results found.   Assessment and Plan  [name] is a 89 y.o. 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  He also has dyspnea on exertion and lower extremity  edema.  Has been going up on his home torsemide  without improvement.    BNP over 4500  creatinine 2.55 up from baseline of 1.73,    Acute on chronic HFrEF (heart failure with reduced ejection fraction) (HCC) --IV Lasix --increased to 80 mg bid --Continue home carvedilol , Entresto , spironolactone  --Daily weights with intake and output monitoring --EF 25 to 30% on echo 12/2023 --follows with [facility] cardiology   Acute renal failure superimposed on stage 3b chronic kidney disease (HCC) --Likely secondary to increased torsemide  dose, possible cardiorenal syndrome --Monitor renal function and avoid nephrotoxins --creat improving   Hypokalemia Hypomagnesimia --Received oral and IV repletion in the ED --pharmacy to replace electrolytes   Scrotal edema Hydrocele, left --Had a recent scrotal ultrasound that showed hydrocele and varicocele.--followed by [facility] -- Patient would like to be seen by urologist while here --spoke with Urology PA and will schedule out pt appt for evaluation of Hydrocoelectomy   Urinary tract infection --Rocephin --po abxs --Follow cultures--Proteus   Pacemaker in place --No acute issues suspected   CAD s/p CABG, multiple vessel --No complaints of chest pain  --continue Imdur , Crestor , carvedilol  and apixaban    Paroxysmal atrial fibrillation (HCC) --Continue Coreg   and apixaban   Pt will need HH at d/c   DVT prophylaxis: Eliquis  Procedures: Family communication :dter [name] Consults : CODE STATUS: full Level of care: Progressive Acute on Chronic CHF--pt will stay 3-4 days   TOTAL TIME TAKING CARE OF THIS PATIENT: 40 minutes.  >50% time spent on counselling and coordination of care  Note: This dictation was prepared with Dragon dictation along with smaller phrase technology. Any transcriptional errors that result from this process are unintentional.  Melvinia Stager M.D    [facility]   CC: Primary care physician; Herlinda Long, MD

## 2024-03-26 NOTE — TOC Initial Note (Addendum)
 Transition of Care [facility]) - Initial/Assessment Note    Patient Details  Name: [name] MRN: [id number] Date of Birth: 03-22-1935  Transition of Care [facility]) CM/SW Contact:    [place] L Jaquanna Ballentine, LCSW Phone Number: 03/26/2024, 4:12 PM  Clinical Narrative:                  CSW spoke with pt and completed readmission screening. Pt alert and oriented. Pt advised that he had a home care agency in place but services were completed and he is no longer receiving services. PCP is Dr. Annabell Key at [facility] internal medicine. [facility] [address]   Pt. Is open to [facility] services. Pt declined SNF placement.  No DME in place at home. Pt advised that he has a cane but seldomly uses it.   HHA resources provided to pt at bedside.        Patient Goals and CMS Choice Patient states their goals for this hospitalization and ongoing recovery are:: Discharged to home with Home Care          Expected Discharge Plan and Services                                              Prior Living Arrangements/Services   Lives with:: Adult Children, Spouse Patient language and need for interpreter reviewed:: Yes Do you feel safe going back to the place where you live?: Yes      Need for Family Participation in Patient Care: Yes (Comment) Care giver support system in place?: Yes (comment)   Criminal Activity/Legal Involvement Pertinent to Current Situation/Hospitalization: No - Comment as needed  Activities of Daily Living   ADL Screening (condition at time of admission) Independently performs ADLs?: Yes (appropriate for developmental age) Is the patient deaf or have difficulty hearing?: No Does the patient have difficulty seeing, even when wearing glasses/contacts?: No Does the patient have difficulty concentrating, remembering, or making decisions?: No  Permission Sought/Granted Permission sought to share information with : Family Supports Permission granted to share information with :  Yes, Verbal Permission Granted  Share Information with NAME: Laderrick Wilk     Permission granted to share info w Relationship: Spouse     Emotional Assessment Appearance:: Appears stated age Attitude/Demeanor/Rapport: Engaged Affect (typically observed): Accepting Orientation: : Oriented to Self, Oriented to Place, Oriented to  Time, Oriented to Situation Alcohol / Substance Use: Not Applicable Psych Involvement: No (comment)  Admission diagnosis:  Scrotal swelling [N50.89] Acute on chronic HFrEF (heart failure with reduced ejection fraction) (HCC) [I50.23] Acute on chronic congestive heart failure, unspecified heart failure type (HCC) [I50.9] Patient Active Problem List   Diagnosis Date Noted   Acute on chronic HFrEF (heart failure with reduced ejection fraction) (HCC) 03/24/2024   Scrotal edema 03/24/2024   Acute renal failure superimposed on stage 3b chronic kidney disease (HCC) 03/24/2024   Shortness of breath 12/24/2023   COVID-19 12/23/2023   CHF exacerbation (HCC) 12/23/2023   ACS (acute coronary syndrome) (HCC) 06/03/2023   Chronic systolic CHF (congestive heart failure) (HCC) 06/03/2023   Cardiac resynchronization therapy pacemaker (CRT-P) in place 05/10/2023   Acute on chronic congestive heart failure (HCC) 03/09/2023   Paroxysmal A-fib (HCC) 03/09/2023   Hypomagnesemia 03/09/2023   Hypercoagulable state due to paroxysmal atrial fibrillation (HCC) 02/08/2023   Paroxysmal atrial fibrillation (HCC) 02/08/2023   Constipation 12/19/2022  Chest pain 12/19/2022   Hypertensive heart disease with heart failure (HCC) 12/18/2022   Thrombocytopenia (HCC) 12/17/2022   CKD stage 3b, GFR 30-44 ml/min (HCC) 12/17/2022   Obesity (BMI 30-39.9) 12/17/2022   Hypokalemia 12/17/2022   Ischemic cardiomyopathy 12/17/2022   Hypertensive urgency 03/29/2022   Dyslipidemia 03/29/2022   GERD without esophagitis 03/29/2022   Type 2 diabetes mellitus with hyperlipidemia (HCC) 03/29/2022    Elevated troponin 03/29/2022   Acute on chronic systolic CHF (congestive heart failure) (HCC) 01/04/2022   Dilated cardiomyopathy (HCC) 10/30/2021   Acute decompensated heart failure (HCC) 10/27/2021   Complete heart block (HCC) 10/12/2021   Junctional bradycardia    Acute on chronic diastolic CHF (congestive heart failure) (HCC) 08/26/2021   CAD (coronary artery disease)    HLD (hyperlipidemia)    GERD (gastroesophageal reflux disease)    Type II diabetes mellitus with renal manifestations (HCC)    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   CAD, multiple vessel 08/25/2020   Non-ST elevation (NSTEMI) myocardial infarction (HCC) 08/21/2020   History of cardiac arrest 08/21/2020   HFrEF (heart failure with reduced ejection fraction) (HCC)    History of sustained ventricular fibrillation 08/18/2020   Acute respiratory failure with hypoxia (HCC)    Chronic diastolic heart failure (HCC)    Urinary tract infection 07/26/2019   HTN (hypertension) 07/26/2019   Diabetes (HCC) 07/26/2019   Unstable angina (HCC) 12/03/2008   Status post percutaneous transluminal coronary angioplasty 10/08/2008   Malignant neoplasm of prostate (HCC) 10/05/2005   PCP:  Herlinda Long, MD Pharmacy:   [id number] [place],  - [address] [place] [address] Phone: [phone] Fax: [phone]  [facility] [address] [place] [address] Phone: [phone] Fax: [phone]     Social Drivers of Health (SDOH) Social History: SDOH Screenings   Food Insecurity: No Food Insecurity (03/25/2024)  Housing: Low Risk  (03/25/2024)  Transportation Needs: No Transportation Needs (03/25/2024)  Utilities: Not At Risk (03/25/2024)  Depression (PHQ2-9): Low Risk  (04/07/2022)  Financial Resource Strain: Low Risk  (06/29/2021)   [facility]  Physical Activity: Inactive (07/27/2019)  Social Connections: Unknown  (03/25/2024)  Stress: No Stress Concern Present (07/27/2019)  Tobacco Use: Medium Risk (03/23/2024)   SDOH Interventions:     Readmission Risk Interventions    03/26/2024    4:09 PM 03/10/2023   12:47 PM 03/29/2022    1:18 PM  Readmission Risk Prevention Plan  Transportation Screening Complete Complete Complete  PCP or Specialist Appt within 3-5 Days  Complete Complete  HRI or Home Care Consult   --  Social Work Consult for Recovery Care Planning/Counseling  Complete --  Palliative Care Screening  Not Applicable Not Applicable  Medication Review Oceanographer) Complete Complete Complete  PCP or Specialist appointment within 3-5 days of discharge Complete    HRI or Home Care Consult Complete    SW Recovery Care/Counseling Consult Complete    Palliative Care Screening Not Applicable    Skilled Nursing Facility Not Applicable

## 2024-03-26 NOTE — Evaluation (Signed)
 Physical Therapy Evaluation Patient Details Name: [name] MRN: [id number] DOB: [birth year] Today's Date: 03/26/2024  History of Present Illness  Pt is an 89 y.o. male presenting to hospital 03/24/24 with c/o scrotal swelling and SOB.  Pt admitted with acute on chronic HFrEF, acute renal failure superimposed on stage 3b CKD, hypokalemia, scrotal edema, hydrocele, UTI.  PMH includes CAD s/p CABG x5, htn, CHF, T2DM, PAF, pacemaker, CKD, back sx.  Clinical Impression  Prior to recent medical concerns, pt reports being modified independent ambulating with SPC; lives with his wife in 1 level home with 5 STE (no railings).  No c/o pain at rest beginning of session but 6/10 B ankle/feet pain reported during ambulation/end of session at rest (pt reports soreness from swelling); nurse notified of pt's request for pain medication.  Currently pt is SBA with bed mobility (extra effort/time to perform on own using bed rail); CGA with transfers; and CGA to ambulate 30 feet with RW use (extra effort/time to take steps d/t B LE swelling and pain in B ankles/feet).  Pt's HR 72-96 bpm and SpO2 sats 96% or greater on room air during sessions activities; mild to moderate SOB noted with activity.  Pt would currently benefit from skilled PT to address noted impairments and functional limitations (see below for any additional details).  Upon hospital discharge, pt would benefit from ongoing therapy.     If plan is discharge home, recommend the following: A little help with walking and/or transfers;A little help with bathing/dressing/bathroom;Assistance with cooking/housework;Assist for transportation;Help with stairs or ramp for entrance   Can travel by private vehicle        Equipment Recommendations Rolling walker (2 wheels)  Recommendations for Other Services  OT consult    Functional Status Assessment Patient has had a recent decline in their functional status and demonstrates the ability to make significant  improvements in function in a reasonable and predictable amount of time.     Precautions / Restrictions Precautions Precautions: Fall Recall of Precautions/Restrictions: Intact Restrictions Weight Bearing Restrictions Per Provider Order: No      Mobility  Bed Mobility Overal bed mobility: Needs Assistance Bed Mobility: Supine to Sit, Sit to Supine     Supine to sit: Supervision, HOB elevated, Used rails Sit to supine: Supervision, HOB elevated, Used rails   General bed mobility comments: increased effort/time to perform on own    Transfers Overall transfer level: Needs assistance Equipment used: Rolling walker (2 wheels) Transfers: Sit to/from Stand Sit to Stand: Contact guard assist           General transfer comment: x1 trial from bed at lowest/regular position and x1 trial standing from elevated bed height (while changing bed linens); vc's for UE placement    Ambulation/Gait Ambulation/Gait assistance: Contact guard assist Gait Distance (Feet): 30 Feet Assistive device: Rolling walker (2 wheels) Gait Pattern/deviations: Step-through pattern, Decreased step length - right, Decreased step length - left Gait velocity: decreased     General Gait Details: increased effort/time to take steps (pt reports d/t B LE swelling and soresness in B feet)  Stairs            Wheelchair Mobility     Tilt Bed    Modified Rankin (Stroke Patients Only)       Balance Overall balance assessment: Needs assistance Sitting-balance support: No upper extremity supported, Feet supported Sitting balance-[name]: Good Sitting balance - Comments: steady reaching within BOS   Standing balance support: Bilateral upper extremity supported, Reliant on  assistive device for balance, During functional activity Standing balance-[name]: Good Standing balance comment: steady ambulating with RW use                             Pertinent Vitals/Pain Pain  Assessment Pain Assessment: 0-10 Pain Score: 6  Pain Location: B feet/ankle pain (from swelling per pt report) Pain Descriptors / Indicators: Sore Pain Intervention(s): Limited activity within patient's tolerance, Monitored during session, Repositioned, Patient requesting pain meds-RN notified    Home Living Family/patient expects to be discharged to:: Private residence Living Arrangements: Spouse/significant other Available Help at Discharge: Family Type of Home: House Home Access: Stairs to enter Entrance Stairs-Rails: None Entrance Stairs-Number of Steps: 5   Home Layout: One level Home Equipment: Cane - single Librarian, academic (2 wheels);Shower seat;Grab bars - toilet;Grab bars - tub/shower      Prior Function Prior Level of Function : Independent/Modified Independent             Mobility Comments: Modified independent ambulating with SPC.  Pt reports 1 fall in last couple weeks on steps (fell forward)--no injuries reported.       Extremity/Trunk Assessment   Upper Extremity Assessment Upper Extremity Assessment: Overall WFL for tasks assessed    Lower Extremity Assessment Lower Extremity Assessment: Generalized weakness       Communication   Communication Communication: Impaired Factors Affecting Communication: Reduced clarity of speech (garbled speech noted (nurse notified and aware))    Cognition Arousal: Alert Behavior During Therapy: WFL for tasks assessed/performed   PT - Cognitive impairments: No apparent impairments                         Following commands: Intact       Cueing Cueing Techniques: Verbal cues     General Comments General comments (skin integrity, edema, etc.): B LE swelling noted.  Pt's bed linens noted to be wet so bed linens removed and bed changed with new/clean bed linens (nurse present assisting).    Exercises     Assessment/Plan    PT Assessment Patient needs continued PT services  PT Problem List  Decreased strength;Decreased activity tolerance;Decreased balance;Decreased mobility;Pain       PT Treatment Interventions DME instruction;Gait training;Stair training;Functional mobility training;Therapeutic activities;Therapeutic exercise;Balance training;Patient/family education    PT Goals (Current goals can be found in the Care Plan section)  Acute Rehab PT Goals Patient Stated Goal: to improve LE swelling and walking PT Goal Formulation: With patient Time For Goal Achievement: 04/09/24 Potential to Achieve Goals: Good    Frequency Min 2X/week     Co-evaluation               AM-PAC PT "6 Clicks" Mobility  Outcome Measure Help needed turning from your back to your side while in a flat bed without using bedrails?: None Help needed moving from lying on your back to sitting on the side of a flat bed without using bedrails?: A Little Help needed moving to and from a bed to a chair (including a wheelchair)?: A Little Help needed standing up from a chair using your arms (e.g., wheelchair or bedside chair)?: A Little Help needed to walk in hospital room?: A Little Help needed climbing 3-5 steps with a railing? : A Lot 6 Click Score: 18    End of Session Equipment Utilized During Treatment: Gait belt Activity Tolerance: Patient limited by fatigue;Patient limited by pain Patient  left: in bed;with call bell/phone within reach;with bed alarm set Nurse Communication: Mobility status;Precautions;Patient requests pain meds PT Visit Diagnosis: Other abnormalities of gait and mobility (R26.89);Muscle weakness (generalized) (M62.81);History of falling (Z91.81);Pain Pain - Right/Left:  (B) Pain - part of body: Ankle and joints of foot    Time: 1610-9604 PT Time Calculation (min) (ACUTE ONLY): 28 min   Charges:   PT Evaluation $PT Eval Low Complexity: 1 Low PT Treatments $Therapeutic Activity: 8-22 mins PT General Charges $$ ACUTE PT VISIT: 1 Visit        Amador Junes,  PT 03/26/24, 10:05 AM

## 2024-03-26 NOTE — TOC Progression Note (Signed)
 Transition of Care [facility]) - Progression Note    Patient Details  Name: [name] MRN: [id number] Date of Birth: [birth year]  Transition of Care [facility]) CM/SW Contact  [place] L Wor[place], [place] Phone Number: 03/26/2024, 2:50 PM  Clinical Narrative:     CSW went to see patient to complete readmission screening. Pt was asleep and not responding to CSW attempting to engage.        Expected Discharge Plan and Services                                               Social Determinants of Health (SDOH) Interventions SDOH Screenings   Food Insecurity: No Food Insecurity (03/25/2024)  Housing: Low Risk  (03/25/2024)  Transportation Needs: No Transportation Needs (03/25/2024)  Utilities: Not At Risk (03/25/2024)  Depression (PHQ2-9): Low Risk  (04/07/2022)  Financial Resource Strain: Low Risk  (06/29/2021)   Received from [facility], [facility]  Physical Activity: Inactive (07/27/2019)  Social Connections: Unknown (03/25/2024)  Stress: No Stress Concern Present (07/27/2019)  Tobacco Use: Medium Risk (03/23/2024)    Readmission Risk Interventions    03/10/2023   12:47 PM 03/29/2022    1:18 PM 01/05/2022    2:16 PM  Readmission Risk Prevention Plan  Transportation Screening Complete Complete Complete  PCP or Specialist Appt within 3-5 Days Complete Complete Complete  HRI or Home Care Consult  -- Complete  Social Work Consult for Recovery Care Planning/Counseling Complete -- Complete  Palliative Care Screening Not Applicable Not Applicable Not Applicable  Medication Review Oceanographer) Complete Complete Complete
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
## 2024-03-26 NOTE — Care Management Important Message (Signed)
 Important Message  Patient Details  Name: [name] MRN: [id number] Date of Birth: Apr 06, 1935   Important Message Given:  Yes - Medicare IM     Anise Kerns 03/26/2024, 12:54 PM

## 2024-03-26 NOTE — TOC Transition Note (Signed)
 Transition of Care [facility]) - Discharge Note   Patient Details  Name: [name] MRN: [id number] Date of Birth: May 25, 1935  Transition of Care [facility]) CM/SW Contact:  [place] L Kazuto Sevey, LCSW Phone Number: 03/26/2024, 4:15 PM   Clinical Narrative:     CSW spoke with spouse, Austan Nicholl. Ms. Paolillo advised that she and her daughter will provide transportation once pt is discharged. Spouse confirmed that Dr. Annabell Key is the PCP for pt.   Spouse advised that HHA services was in place but it ended. She was not able to recall the name of the agency.         Patient Goals and CMS Choice Patient states their goals for this hospitalization and ongoing recovery are:: Discharged to home with Home Care          Discharge Placement                       Discharge Plan and Services Additional resources added to the After Visit Summary for                                       Social Drivers of Health (SDOH) Interventions SDOH Screenings   Food Insecurity: No Food Insecurity (03/25/2024)  Housing: Low Risk  (03/25/2024)  Transportation Needs: No Transportation Needs (03/25/2024)  Utilities: Not At Risk (03/25/2024)  Depression (PHQ2-9): Low Risk  (04/07/2022)  Financial Resource Strain: Low Risk  (06/29/2021)   Received from [facility], [facility]  Physical Activity: Inactive (07/27/2019)  Social Connections: Unknown (03/25/2024)  Stress: No Stress Concern Present (07/27/2019)  Tobacco Use: Medium Risk (03/23/2024)     Readmission Risk Interventions    03/26/2024    4:09 PM 03/10/2023   12:47 PM 03/29/2022    1:18 PM  Readmission Risk Prevention Plan  Transportation Screening Complete Complete Complete  PCP or Specialist Appt within 3-5 Days  Complete Complete  HRI or Home Care Consult   --  Social Work Consult for Recovery Care Planning/Counseling  Complete --  Palliative Care Screening  Not Applicable Not Applicable  Medication Review Oceanographer)  Complete Complete Complete  PCP or Specialist appointment within 3-5 days of discharge Complete    HRI or Home Care Consult Complete    SW Recovery Care/Counseling Consult Complete    Palliative Care Screening Not Applicable    Skilled Nursing Facility Not Applicable

## 2024-03-26 NOTE — Consult Note (Addendum)
 PHARMACY CONSULT NOTE  Pharmacy Consult for Electrolyte Monitoring and Replacement   Recent Labs: Potassium (mmol/L)  Date Value  03/26/2024 3.3 (L)  01/06/2014 3.0 (L)   Magnesium  (mg/dL)  Date Value  13/06/6577 1.6 (L)  01/06/2014 0.9 (L)   Calcium  (mg/dL)  Date Value  46/96/2952 6.8 (L)   Calcium , Total (mg/dL)  Date Value  84/13/2440 7.7 (L)   Albumin  (g/dL)  Date Value  09/04/2535 2.4 (L)  01/06/2014 3.5   Phosphorus (mg/dL)  Date Value  64/40/3474 3.1   Sodium (mmol/L)  Date Value  03/26/2024 140  08/09/2023 143  01/06/2014 142     Assessment:  89 y.o. 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 Scr is trending down.   Currently on lasix  IV 40 mg BID + metolazone  weekly, spironolactone , farxiga  and entresto .   Goal of Therapy:  WNL  Plan:  Kcl 40 mEq x 2. Will start Kcl 20 mEq daily.  Mg 4 g IV x 1  F/u with AM labs.   Trinidad Funk, PharmD, BCPS 03/26/2024 8:34 AM

## 2024-03-26 NOTE — Care Management Obs Status (Signed)
 MEDICARE OBSERVATION STATUS NOTIFICATION   Patient Details  Name: [name] MRN: [id number] Date of Birth: September 05, 1935   Medicare Observation Status Notification Given:  Yes    Anise Kerns 03/26/2024, 12:55 PM

## 2024-03-26 NOTE — Progress Notes (Signed)
 OT Cancellation Note  Patient Details Name: [name] MRN: [id number] DOB: Sep 10, 1935   Cancelled Treatment:    Reason Eval/Treat Not Completed: Fatigue/lethargy limiting ability to participate. Consult received, chart reviewed. Pt sleeping soundly upon attempt, does not wake to gentle verbal or tactile cues. Will attempt OT evaluation at later date/time.   Caprisha Bridgett R., MPH, MS, OTR/L ascom [phone] 03/26/24, 3:00 PM

## 2024-03-27 ENCOUNTER — Telehealth (HOSPITAL_COMMUNITY): Payer: Self-pay | Admitting: Pharmacy Technician

## 2024-03-27 ENCOUNTER — Other Ambulatory Visit (HOSPITAL_COMMUNITY): Payer: Self-pay

## 2024-03-27 DIAGNOSIS — I5023 Acute on chronic systolic (congestive) heart failure: Secondary | ICD-10-CM | POA: Diagnosis not present

## 2024-03-27 LAB — URINE CULTURE: Culture: >=100000 — AB

## 2024-03-27 LAB — BASIC METABOLIC PANEL WITH GFR
Anion gap: 11 (ref 5–15)
BUN: 42 mg/dL — ABNORMAL HIGH (ref 8–23)
CO2: 26 mmol/L (ref 22–32)
Calcium: 7.3 mg/dL — ABNORMAL LOW (ref 8.9–10.3)
Chloride: 101 mmol/L (ref 98–111)
Creatinine, Ser: 1.91 mg/dL — ABNORMAL HIGH (ref 0.61–1.24)
GFR, Estimated: 34 mL/min — ABNORMAL LOW (ref >60)
Glucose, Bld: 103 mg/dL — ABNORMAL HIGH (ref 70–99)
Potassium: 4 mmol/L (ref 3.5–5.1)
Sodium: 138 mmol/L (ref 135–145)

## 2024-03-27 LAB — MAGNESIUM: Magnesium: 2 mg/dL (ref 1.7–2.4)

## 2024-03-27 LAB — GLUCOSE, CAPILLARY
Glucose-Capillary: 97 mg/dL (ref 70–99)
Glucose-Capillary: 181 mg/dL — ABNORMAL HIGH (ref 70–99)
Glucose-Capillary: 159 mg/dL — ABNORMAL HIGH (ref 70–99)
Glucose-Capillary: 96 mg/dL (ref 70–99)
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
## 2024-03-27 NOTE — Consult Note (Signed)
 PHARMACY CONSULT NOTE  Pharmacy Consult for Electrolyte Monitoring and Replacement   Recent Labs: Potassium (mmol/L)  Date Value  03/27/2024 4.0  01/06/2014 3.0 (L)   Magnesium  (mg/dL)  Date Value  47/82/9562 2.0  01/06/2014 0.9 (L)   Calcium  (mg/dL)  Date Value  13/06/6577 7.3 (L)   Calcium , Total (mg/dL)  Date Value  46/96/2952 7.7 (L)   Albumin  (g/dL)  Date Value  84/13/2440 2.4 (L)  01/06/2014 3.5   Phosphorus (mg/dL)  Date Value  09/04/2535 3.1   Sodium (mmol/L)  Date Value  03/27/2024 138  08/09/2023 143  01/06/2014 142     Assessment:  89 y.o. 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 Scr is trending down.   Currently on lasix  IV 80 mg BID + metolazone  weekly, spironolactone , farxiga  and entresto .   Goal of Therapy:  WNL  Plan:  Continue Kcl 20 mEq daily.  F/u with AM labs.   Trinidad Funk, PharmD, BCPS 03/27/2024 9:37 AM

## 2024-03-27 NOTE — Progress Notes (Signed)
 PT Cancellation Note  Patient Details Name: [name] MRN: [id number] DOB: 17-Jan-1935   Cancelled Treatment:    Reason Eval/Treat Not Completed: Pain limiting ability to participate.  Pt resting in bed upon PT arrival and c/o 9/10 pain in B feet; nurse notified and gave pt IV pain medication but pt still declining therapy.  Will re-attempt PT session at a later date/time.  Amador Junes, PT 03/27/24, 2:51 PM

## 2024-03-27 NOTE — Plan of Care (Signed)

## 2024-03-27 NOTE — Evaluation (Signed)
 Occupational Therapy Evaluation Patient Details Name: [name] MRN: [id number] DOB: [birth year] Today's Date: 03/27/2024   History of Present Illness   Pt is an 89 y.o. male presenting to hospital 03/24/24 with c/o scrotal swelling and SOB.  Pt admitted with acute on chronic HFrEF, acute renal failure superimposed on stage 3b CKD, hypokalemia, scrotal edema, hydrocele, UTI.  PMH includes CAD s/p CABG x5, htn, CHF, T2DM, PAF, pacemaker, CKD, back sx.     Clinical Impressions Pt was seen for OT evaluation this date. PTA, pt lives at home with his wife in a 1 level home with 5 STE and no railings. Wife assists him with ADLs as needed. He was ambulating with SPC. Pt presents to acute OT demonstrating impaired ADL performance and functional mobility 2/2 weakness, pain, low activity tolerance and balance deficits. He reports 9/10 pain to his R foot/ankle and was pre-medicated prior to session. Pt currently requires SUP with increased time and effort to perform bed mobility, cueing for bed rail use from semi-supine position. Max A to don R sock, SUP to don L sock via figure four. He required Min A for STS from EOB to RW, total assist for standing peri-care after small BM. Pt was CGA for taking steps to pivot to recliner using RW. Set up assist to wash his face and neck seated at EOB. Pt had not yet eaten his breakfast d/t being asleep and wished to eat in recliner.  Pt would benefit from skilled OT services to address noted impairments and functional limitations to maximize safety and independence while minimizing falls risk and caregiver burden. Do anticipate the need for follow up OT services upon acute hospital DC.      If plan is discharge home, recommend the following:   A little help with walking and/or transfers;A lot of help with bathing/dressing/bathroom;Assistance with cooking/housework;Help with stairs or ramp for entrance     Functional Status Assessment   Patient has had a recent  decline in their functional status and demonstrates the ability to make significant improvements in function in a reasonable and predictable amount of time.     Equipment Recommendations   None recommended by OT (has needed DME)     Recommendations for Other Services         Precautions/Restrictions   Precautions Precautions: Fall Recall of Precautions/Restrictions: Intact Restrictions Weight Bearing Restrictions Per Provider Order: No     Mobility Bed Mobility Overal bed mobility: Needs Assistance Bed Mobility: Supine to Sit     Supine to sit: Supervision, HOB elevated, Used rails     General bed mobility comments: increased time/effort to reach EOB, able to forward scoot    Transfers Overall transfer level: Needs assistance Equipment used: Rolling walker (2 wheels) Transfers: Sit to/from Stand Sit to Stand: Min assist           General transfer comment: cueing for anterior weight shift, hand placement, difficulty standing d/t R foot pain requiring Min A from low bed      Balance Overall balance assessment: Needs assistance Sitting-balance support: No upper extremity supported, Feet supported Sitting balance-[name]: Good Sitting balance - Comments: able to don sock on L foot without LOB via figure four   Standing balance support: Bilateral upper extremity supported, Reliant on assistive device for balance, During functional activity Standing balance-[name]: Good Standing balance comment: CGA, RW use for transfer  ADL either performed or assessed with clinical judgement   ADL Overall ADL's : Needs assistance/impaired     Grooming: Wash/dry face;Set up;Sitting Grooming Details (indicate cue type and reason): at EOB                 Toilet Transfer: Contact guard assist;Rolling walker (2 wheels) Toilet Transfer Details (indicate cue type and reason): simulated to recliner Toileting- Clothing  Manipulation and Hygiene: Maximal assistance;Sit to/from stand Toileting - Clothing Manipulation Details (indicate cue type and reason): after partial BM     Functional mobility during ADLs: Contact guard assist       Vision         Perception         Praxis         Pertinent Vitals/Pain Pain Assessment Pain Assessment: 0-10 Pain Score: 9  Pain Location: R foot/ankle pain (from swelling per pt report) Pain Descriptors / Indicators: Sore Pain Intervention(s): Monitored during session, Repositioned, Premedicated before session     Extremity/Trunk Assessment Upper Extremity Assessment Upper Extremity Assessment: Overall WFL for tasks assessed   Lower Extremity Assessment Lower Extremity Assessment: Generalized weakness       Communication Communication Communication: Impaired Factors Affecting Communication: Reduced clarity of speech (garbled speech noted (nurse notified and aware))   Cognition Arousal: Alert Behavior During Therapy: WFL for tasks assessed/performed                                 Following commands: Intact       Cueing  General Comments   Cueing Techniques: Verbal cues      Exercises Other Exercises Other Exercises: Edu on role of OT in acute setting.   Shoulder Instructions      Home Living Family/patient expects to be discharged to:: Private residence Living Arrangements: Spouse/significant other Available Help at Discharge: Family Type of Home: House Home Access: Stairs to enter Secretary/administrator of Steps: 5 Entrance Stairs-Rails: None Home Layout: One level     Bathroom Shower/Tub: Producer, television/film/video: Handicapped height     Home Equipment: Cane - single Librarian, academic (2 wheels);Shower seat;Grab bars - toilet;Grab bars - tub/shower          Prior Functioning/Environment Prior Level of Function : Independent/Modified Independent             Mobility Comments: Modified  independent ambulating with SPC.  Pt reports 1 fall in last couple weeks on steps (fell forward)--no injuries reported. ADLs Comments: mostly IND, wife and family assist as needed    OT Problem List: Decreased strength;Decreased activity tolerance   OT Treatment/Interventions: Self-care/ADL training;Therapeutic exercise;Balance training;Therapeutic activities;Energy conservation;DME and/or AE instruction;Patient/family education      OT Goals(Current goals can be found in the care plan section)   Acute Rehab OT Goals Patient Stated Goal: return home OT Goal Formulation: With patient Time For Goal Achievement: 04/10/24 Potential to Achieve Goals: Good ADL Goals Pt Will Perform Lower Body Bathing: sitting/lateral leans;sit to/from stand;with contact guard assist Pt Will Perform Lower Body Dressing: with min assist;sit to/from stand;sitting/lateral leans Pt Will Transfer to Toilet: with contact guard assist;ambulating;regular height toilet;grab bars;bedside commode;with supervision Pt Will Perform Toileting - Clothing Manipulation and hygiene: with contact guard assist;sitting/lateral leans;sit to/from stand;with min assist   OT Frequency:  Min 2X/week    Co-evaluation              AM-PAC OT "6  Clicks" Daily Activity     Outcome Measure Help from another person eating meals?: None Help from another person taking care of personal grooming?: None Help from another person toileting, which includes using toliet, bedpan, or urinal?: A Lot Help from another person bathing (including washing, rinsing, drying)?: A Little Help from another person to put on and taking off regular upper body clothing?: A Little Help from another person to put on and taking off regular lower body clothing?: A Lot 6 Click Score: 18   End of Session Equipment Utilized During Treatment: Gait belt;Rolling walker (2 wheels) Nurse Communication: Mobility status  Activity Tolerance: Patient tolerated  treatment well Patient left: in chair;with call bell/phone within reach;with chair alarm set;with nursing/sitter in room  OT Visit Diagnosis: Other abnormalities of gait and mobility (R26.89)                Time: 0950-1008 OT Time Calculation (min): 18 min Charges:  OT General Charges $OT Visit: 1 Visit OT Evaluation $OT Eval Moderate Complexity: 1 Mod Antwaun Buth, OTR/L 03/27/24, 1:16 PM  Valla Pacey E Masayoshi Couzens 03/27/2024, 1:11 PM

## 2024-03-27 NOTE — Progress Notes (Signed)
 [facility]  - [place] [facility]   PATIENT NAME: [name]    MR#:  [id number]  DATE OF BIRTH:  1935/11/03  SUBJECTIVE:  patient seen earlier. wife at bedside. Good UOP Improving slowly   VITALS:  Blood pressure (!) 100/55, pulse 65, temperature 98 F (36.7 C), temperature source Oral, resp. rate 19, height 5\' 8"  (1.727 m), weight 103.2 kg, SpO2 96%.  PHYSICAL EXAMINATION:   GENERAL:  89 y.o.-year-old patient with no acute distress. Obese LUNGS: decreased breath sounds bilaterally, no wheezing CARDIOVASCULAR: S1, S2 normal. No murmur   ABDOMEN: Soft, nontender, nondistended. Scrotal edema++ EXTREMITIES: + edema b/l.    NEUROLOGIC: nonfocal  patient is alert and awake SKIN: No obvious rash, lesion, or ulcer.   LABORATORY PANEL:  CBC Recent Labs  Lab 03/23/24 2043  WBC 5.5  HGB 12.7*  HCT 41.2  PLT 141*    Chemistries  Recent Labs  Lab 03/23/24 2043 03/24/24 0514 03/27/24 0804  NA 141   < > 138  K 2.9*   < > 4.0  CL 104   < > 101  CO2 23   < > 26  GLUCOSE 143*   < > 103*  BUN 48*   < > 42*  CREATININE 2.55*   < > 1.91*  CALCIUM  7.1*   < > 7.3*  MG  --    < > 2.0  AST 20  --   --   ALT 10  --   --   ALKPHOS 60  --   --   BILITOT 0.8  --   --    < > = values in this interval not displayed.   Cardiac Enzymes No results for input(s): "TROPONINI" in the last 168 hours. RADIOLOGY:  No results found.   Assessment and Plan  [name] is a 89 y.o. 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  He also has dyspnea on exertion and lower extremity edema.  Has been going up on his  home torsemide  without improvement.    BNP over 4500  creatinine 2.55 up from baseline of 1.73,    Acute on chronic HFrEF (heart failure with reduced ejection fraction) (HCC) --IV Lasix --increased to 80 mg bid --Continue home carvedilol , Entresto , spironolactone  --Daily weights with intake and output monitoring --EF 25 to 30% on echo 12/2023 --follows with [facility] cardiology --HOLD farxiga  due to UTI --leg edema improving. Will cont IV lasix  for couple more days   Acute renal failure superimposed on stage 3b chronic kidney disease (HCC) -- possible cardiorenal syndrome --Monitor renal function and avoid nephrotoxins --creat improving   Hypokalemia Hypomagnesimia --Received oral and IV repletion in the ED --pharmacy to replace electrolytes   Scrotal edema Hydrocele, left --Had a recent scrotal ultrasound that showed hydrocele and varicocele.--followed by [facility] -- Patient would like to be seen by urologist while here --spoke with Urology PA and will schedule out pt appt for evaluation of Hydrocoelectomy--June 3rd with Dr Estanislao Heimlich   Urinary tract infection --Rocephin -- now on po abxs --Follow cultures--Proteus   Pacemaker in place --No acute issues suspected   CAD s/p CABG, multiple vessel --No complaints of chest pain  --continue  Imdur , Crestor , carvedilol  and apixaban    Paroxysmal atrial fibrillation (HCC) --Continue Coreg  and apixaban   Pt will need HH at d/c in 1-2 days   DVT prophylaxis: Eliquis  Procedures: Family communication :wife at bedside Consults :none CODE STATUS: full Level of care: Progressive Acute on Chronic CHF--pt will stay 1-2 days   TOTAL TIME TAKING CARE OF THIS PATIENT: 40 minutes.  >50% time spent on counselling and coordination of care  Note: This dictation was prepared with Dragon dictation along with smaller phrase technology. Any transcriptional errors that result from this process are unintentional.  Melvinia Stager M.D    [facility]    CC: Primary care physician; Herlinda Long, MD

## 2024-03-27 NOTE — TOC Progression Note (Signed)
 Transition of Care [facility]) - Progression Note    Patient Details  Name: [name] MRN: [id number] Date of Birth: 14-Nov-1934  Transition of Care [facility] CM/SW Contact  Odilia Bennett, LCSW Phone Number: 03/27/2024, 1:26 PM  Clinical Narrative: [facility] can add OT. CSW tried calling wife to update and see if she wanted him to get a RW but no answer/unable to leave voicemail.    Expected Discharge Plan: Home w Home Health Services    Expected Discharge Plan and Services                                               Social Determinants of Health (SDOH) Interventions SDOH Screenings   Food Insecurity: No Food Insecurity (03/25/2024)  Housing: Low Risk  (03/25/2024)  Transportation Needs: No Transportation Needs (03/25/2024)  Utilities: Not At Risk (03/25/2024)  Depression (PHQ2-9): Low Risk  (04/07/2022)  Financial Resource Strain: Low Risk  (06/29/2021)   Received from [facility], [facility]  Physical Activity: Inactive (07/27/2019)  Social Connections: Unknown (03/25/2024)  Stress: No Stress Concern Present (07/27/2019)  Tobacco Use: Medium Risk (03/23/2024)    Readmission Risk Interventions    03/26/2024    4:09 PM 03/10/2023   12:47 PM 03/29/2022    1:18 PM  Readmission Risk Prevention Plan  Transportation Screening Complete Complete Complete  PCP or Specialist Appt within 3-5 Days  Complete Complete  HRI or Home Care Consult   --  Social Work Consult for Recovery Care Planning/Counseling  Complete --  Palliative Care Screening  Not Applicable Not Applicable  Medication Review Oceanographer) Complete Complete Complete  PCP or Specialist appointment within 3-5 days of discharge Complete    HRI or Home Care Consult Complete    SW Recovery Care/Counseling Consult Complete    Palliative Care Screening Not Applicable    Skilled Nursing Facility Not Applicable

## 2024-03-27 NOTE — Telephone Encounter (Signed)
 Patient Product/process development scientist completed.    The patient is insured through [employer]. Patient has Medicare and is not eligible for a copay card, but may be able to apply for patient assistance or Medicare RX Payment Plan (Patient Must reach out to their plan, if eligible for payment plan), if available.    Ran test claim for Entresto  24-26 mg and the current 30 day co-pay is $0.00.  Ran test claim for Farxiga  10 mg and was filled on 03/26/2024 for a 90 day supply, can be filled again on 06/02/2024     This test claim was processed through [facility]- copay amounts may vary at other pharmacies due to pharmacy/plan contracts, or as the patient moves through the different stages of their insurance plan.     Morgan Arab, CPHT Pharmacy Technician III Certified Patient Advocate [facility] Patient Advocate Team Direct Number: [phone]  Fax: [phone]

## 2024-03-28 DIAGNOSIS — I5023 Acute on chronic systolic (congestive) heart failure: Secondary | ICD-10-CM | POA: Diagnosis not present

## 2024-03-28 LAB — GLUCOSE, CAPILLARY
Glucose-Capillary: 67 mg/dL — ABNORMAL LOW (ref 70–99)
Glucose-Capillary: 91 mg/dL (ref 70–99)
Glucose-Capillary: 211 mg/dL — ABNORMAL HIGH (ref 70–99)
Glucose-Capillary: 100 mg/dL — ABNORMAL HIGH (ref 70–99)
Glucose-Capillary: 194 mg/dL — ABNORMAL HIGH (ref 70–99)

## 2024-03-28 LAB — BASIC METABOLIC PANEL WITH GFR
Anion gap: 11 (ref 5–15)
BUN: 39 mg/dL — ABNORMAL HIGH (ref 8–23)
CO2: 29 mmol/L (ref 22–32)
Calcium: 7.9 mg/dL — ABNORMAL LOW (ref 8.9–10.3)
Chloride: 100 mmol/L (ref 98–111)
Creatinine, Ser: 2.01 mg/dL — ABNORMAL HIGH (ref 0.61–1.24)
GFR, Estimated: 32 mL/min — ABNORMAL LOW (ref >60)
Glucose, Bld: 69 mg/dL — ABNORMAL LOW (ref 70–99)
Potassium: 3.7 mmol/L (ref 3.5–5.1)
Sodium: 140 mmol/L (ref 135–145)

## 2024-03-28 LAB — MAGNESIUM: Magnesium: 2 mg/dL (ref 1.7–2.4)

## 2024-03-28 MED ORDER — OXYCODONE HCL 5 MG PO TABS
5.0000 mg | ORAL_TABLET | ORAL | Status: DC | PRN
  Administered 2024-03-28: 5 mg via ORAL
  Filled 2024-03-28: qty 1

## 2024-03-28 MED ORDER — CIPROFLOXACIN HCL 500 MG PO TABS
250.0000 mg | ORAL_TABLET | Freq: Two times a day (BID) | ORAL | Status: DC
  Administered 2024-03-28 – 2024-03-30 (×4): 250 mg via ORAL
  Filled 2024-03-28 (×5): qty 0.5

## 2024-03-28 MED ORDER — POTASSIUM CHLORIDE CRYS ER 20 MEQ PO TBCR
40.0000 meq | EXTENDED_RELEASE_TABLET | Freq: Once | ORAL | Status: AC
  Administered 2024-03-28: 40 meq via ORAL
  Filled 2024-03-28: qty 2

## 2024-03-28 MED ORDER — INSULIN GLARGINE-YFGN 100 UNIT/ML ~~LOC~~ SOLN
12.0000 [IU] | Freq: Every day | SUBCUTANEOUS | Status: DC
  Administered 2024-03-28 – 2024-03-29 (×2): 12 [IU] via SUBCUTANEOUS
  Filled 2024-03-28 (×3): qty 0.12

## 2024-03-28 NOTE — Progress Notes (Signed)
 Occupational Therapy Treatment Patient Details Name: [name] MRN: [id number] DOB: 1935-08-08 Today's Date: 03/28/2024   History of present illness Pt is an 89 y.o. male presenting to hospital 03/24/24 with c/o scrotal swelling and SOB.  Pt admitted with acute on chronic HFrEF, acute renal failure superimposed on stage 3b CKD, hypokalemia, scrotal edema, hydrocele, UTI.  PMH includes CAD s/p CABG x5, htn, CHF, T2DM, PAF, pacemaker, CKD, back sx.   OT comments  Pt is seated in recliner on arrival. Pleasant and agreeable to OT session. He continues to have foot pain and swelling, but is agreeable to mobility. Pt performed LB dressing with supervision at recliner level. Pt required Min A x2 (Mod A x1) for STS from recliner with cueing for anterior weight shift, scooting to forward of chair. Progressed mobility this date using RW with CGA for ~50 feet. Pt returned to supine with supervision. Edu on OOB activity and trying to get up to use the bathroom with nursing, however he reports d/t fluid pill he is going too often.  Pt returned to bed with all needs in place and will cont to require skilled acute OT services to maximize his safety and IND to return to PLOF.       If plan is discharge home, recommend the following:  A little help with walking and/or transfers;Assistance with cooking/housework;Help with stairs or ramp for entrance;A little help with bathing/dressing/bathroom   Equipment Recommendations  None recommended by OT    Recommendations for Other Services      Precautions / Restrictions Precautions Precautions: Fall Recall of Precautions/Restrictions: Intact Restrictions Weight Bearing Restrictions Per Provider Order: No       Mobility Bed Mobility Overal bed mobility: Needs Assistance Bed Mobility: Sit to Supine       Sit to supine: Supervision   General bed mobility comments: able to return to supine without assist    Transfers Overall transfer level: Needs  assistance Equipment used: Rolling walker (2 wheels) Transfers: Sit to/from Stand Sit to Stand: Min assist, +2 physical assistance           General transfer comment: increased time and effort to forward scoot, cueing to push up from recliner; Min A x2 to Mod A x1 to stand from low recliner; amb ~50 feet with CGA     Balance Overall balance assessment: Needs assistance Sitting-balance support: No upper extremity supported, Feet supported Sitting balance-[name]: Good     Standing balance support: Bilateral upper extremity supported, Reliant on assistive device for balance Standing balance-[name]: Fair Standing balance comment: RW use and CGA for safety d/t foot pain                           ADL either performed or assessed with clinical judgement   ADL Overall ADL's : Needs assistance/impaired                     Lower Body Dressing: Supervision/safety;Sitting/lateral leans Lower Body Dressing Details (indicate cue type and reason): to don bil socks via figure four Toilet Transfer: Rolling walker (2 wheels);Minimal assistance;+2 for physical assistance Toilet Transfer Details (indicate cue type and reason): simulated bed/recliner (Mod A x1 to Min A x2 for STS from recliner)                Extremity/Trunk Assessment              Vision  Perception     Praxis     Communication Communication Communication: Impaired Factors Affecting Communication: Reduced clarity of speech (garbled speech noted (nurse notified and aware))   Cognition Arousal: Alert Behavior During Therapy: WFL for tasks assessed/performed                                 Following commands: Intact        Cueing   Cueing Techniques: Verbal cues  Exercises      Shoulder Instructions       General Comments continued scrotal swelling and BLE pain/swelling    Pertinent Vitals/ Pain       Pain Assessment Pain Assessment: Faces Faces  Pain Scale: Hurts little more Pain Location: R foot/ankle pain (from swelling per pt report) Pain Descriptors / Indicators: Sore Pain Intervention(s): Monitored during session  Home Living                                          Prior Functioning/Environment              Frequency  Min 2X/week        Progress Toward Goals  OT Goals(current goals can now be found in the care plan section)  Progress towards OT goals: Progressing toward goals  Acute Rehab OT Goals Patient Stated Goal: return home OT Goal Formulation: With patient Time For Goal Achievement: 04/10/24 Potential to Achieve Goals: Good  Plan      Co-evaluation                 AM-PAC OT "6 Clicks" Daily Activity     Outcome Measure   Help from another person eating meals?: None Help from another person taking care of personal grooming?: None Help from another person toileting, which includes using toliet, bedpan, or urinal?: A Little Help from another person bathing (including washing, rinsing, drying)?: A Little Help from another person to put on and taking off regular upper body clothing?: A Little Help from another person to put on and taking off regular lower body clothing?: A Little 6 Click Score: 20    End of Session Equipment Utilized During Treatment: Gait belt;Rolling walker (2 wheels)  OT Visit Diagnosis: Other abnormalities of gait and mobility (R26.89)   Activity Tolerance Patient tolerated treatment well   Patient Left in bed;with call bell/phone within reach;with bed alarm set   Nurse Communication Mobility status        Time: 0102-7253 OT Time Calculation (min): 23 min  Charges: OT General Charges $OT Visit: 1 Visit OT Treatments $Therapeutic Activity: 23-37 mins  Rinnah Peppel, OTR/L  03/28/24, 3:42 PM   Chaniya Genter E Tiffney Haughton 03/28/2024, 3:39 PM
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
## 2024-03-28 NOTE — Progress Notes (Signed)
 ReDS Vest / Clip - 03/28/24 1002       ReDS Vest / Clip   Station Marker C    Ruler Value 31    ReDS Value Range High volume overload    ReDS Actual Value 54    Anatomical Comments Second check. First [id number]             Celedonio Coil, RN, BSN [facility] Heart Failure Navigator Secure Chat Only

## 2024-03-28 NOTE — Progress Notes (Signed)
 Pt's serum glucose is 69. Checked a CBG for 67. Pt denies symptoms of hypoglycemia. He is drinking orange juice and will be rechecked.

## 2024-03-28 NOTE — Progress Notes (Addendum)
 [facility]  - [place] at [facility]   PATIENT NAME: [name]    MR#:  [id number]  DATE OF BIRTH:  09-Jun-1935  SUBJECTIVE:  Reports breathing more easily but still with leg swelling   VITALS:  Blood pressure (!) 106/55, pulse (!) 116, temperature 98.1 F (36.7 C), resp. rate 13, height 5\' 8"  (1.727 m), weight 100.4 kg, SpO2 94%.  PHYSICAL EXAMINATION:   GENERAL:  89 y.o.-year-old patient with no acute distress. Obese LUNGS: decreased breath sounds bilaterally, no wheezing CARDIOVASCULAR: S1, S2 normal. No murmur   ABDOMEN: Soft, nontender, nondistended.   EXTREMITIES: + edema b/l.    NEUROLOGIC: nonfocal  patient is alert and awake SKIN: No obvious rash, lesion, or ulcer.   LABORATORY PANEL:  CBC Recent Labs  Lab 03/23/24 2043  WBC 5.5  HGB 12.7*  HCT 41.2  PLT 141*    Chemistries  Recent Labs  Lab 03/23/24 2043 03/24/24 0514 03/28/24 0518  NA 141   < > 140  K 2.9*   < > 3.7  CL 104   < > 100  CO2 23   < > 29  GLUCOSE 143*   < > 69*  BUN 48*   < > 39*  CREATININE 2.55*   < > 2.01*  CALCIUM  7.1*   < > 7.9*  MG  --    < > 2.0  AST 20  --   --   ALT 10  --   --   ALKPHOS 60  --   --   BILITOT 0.8  --   --    < > = values in this interval not displayed.   Cardiac Enzymes No results for input(s): "TROPONINI" in the last 168 hours. RADIOLOGY:  No results found.   Assessment and Plan  [name] is a 89 y.o. 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  He also has dyspnea on exertion and lower extremity edema.  Has been going up on his home torsemide  without improvement.     BNP over 4500  creatinine 2.55 up from baseline of 1.73,    Acute on chronic HFrEF (heart failure with reduced ejection fraction) (HCC) --IV Lasix --increased to 80 mg bid --Continue home carvedilol , Entresto , spironolactone  --Daily weights with intake and output monitoring --EF 25 to 30% on echo 12/2023 --follows with [facility] cardiology --HOLD farxiga  due to UTI --leg edema improving. Will cont IV lasix     Acute renal failure superimposed on stage 3b chronic kidney disease (HCC) -- possible cardiorenal syndrome --Monitor renal function and avoid nephrotoxins --creat improving, gfr low 30s today   Hypokalemia Hypomagnesimia resolved   Scrotal edema Hydrocele, left --Had a recent scrotal ultrasound that showed hydrocele and varicocele.--followed by [facility] -- Patient would like to be seen by urologist while here --spoke with Urology PA and will schedule out pt appt for evaluation of Hydrocoelectomy--June 3rd with Dr [name]   Urinary tract infection Culture finalized as proteus and staph haemolyticus yesterday. On cephalosporin but that won't treat the staph. - switch to cipro, plan 5 days tx   Pacemaker in place --No acute issues suspected   CAD s/p CABG, multiple vessel --No complaints of  chest pain  --continue Imdur , Crestor , carvedilol  and apixaban    Paroxysmal atrial fibrillation (HCC) --Continue Coreg  and apixaban   T2DM Hypoglycemic overnight - decrease basal  Pt will need HH at d/c in 1-2 days   DVT prophylaxis: Eliquis  Procedures: none Family communication : daughter [name] updated telephonically 5/21 Consults :none CODE STATUS: full Level of care: Progressive  [name] M.D    [facility]   CC: Primary care physician; Duane Long, MD

## 2024-03-28 NOTE — Consult Note (Signed)
 PHARMACY CONSULT NOTE  Pharmacy Consult for Electrolyte Monitoring and Replacement   Recent Labs: Potassium (mmol/L)  Date Value  03/28/2024 3.7  01/06/2014 3.0 (L)   Magnesium  (mg/dL)  Date Value  28/41/3244 2.0  01/06/2014 0.9 (L)   Calcium  (mg/dL)  Date Value  11/10/7251 7.9 (L)   Calcium , Total (mg/dL)  Date Value  66/44/0347 7.7 (L)   Albumin  (g/dL)  Date Value  42/59/5638 2.4 (L)  01/06/2014 3.5   Phosphorus (mg/dL)  Date Value  75/64/3329 3.1   Sodium (mmol/L)  Date Value  03/28/2024 140  08/09/2023 143  01/06/2014 142     Assessment:  89 y.o. 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 Scr is trending down.   Currently on lasix  IV 80 mg BID + metolazone  weekly, spironolactone , farxiga  and entresto .   Goal of Therapy:  WNL  Plan:  --Give Kcl 40 mEq PO x 1 today and then continue Kcl 20 mEq PO daily with ongoing Lasix  --Will sign off on consult given overall stability. Pharmacy will continue to monitor peripherally   Page Boast 03/28/2024 7:45 AM

## 2024-03-28 NOTE — Plan of Care (Signed)

## 2024-03-28 NOTE — Inpatient Diabetes Management (Signed)
 Inpatient Diabetes Program Recommendations  AACE/ADA: New Consensus Statement on Inpatient Glycemic Control   Target Ranges:  Prepandial:   less than 140 mg/dL      Peak postprandial:   less than 180 mg/dL (1-2 hours)      Critically ill patients:  140 - 180 mg/dL    Latest Reference Range & Units 03/27/24 08:15 03/27/24 12:08 03/27/24 17:50 03/27/24 21:20 03/28/24 06:57  Glucose-Capillary 70 - 99 mg/dL 97 161 (H) 096 (H) 96 67 (L)    Review of Glycemic Control  Diabetes history: DM2 Outpatient Diabetes medications: Basaglar  15 units at bedtime, Farxiga  10 mg daily Current orders for Inpatient glycemic control: Semglee  15 units at bedtime, Novolog  0-15 units TID with meals, Novolog  0-5 units QHS  Inpatient Diabetes Program Recommendations:    Insulin : CBG 67 mg/dl this morning. Please consider decreasing Semglee  to 13 units at bedtime.  Thanks, Beacher Limerick, RN, MSN, CDCES Diabetes Coordinator Inpatient Diabetes Program [phone] (Team Pager from 8am to 5pm)

## 2024-03-29 DIAGNOSIS — I5023 Acute on chronic systolic (congestive) heart failure: Secondary | ICD-10-CM | POA: Diagnosis not present

## 2024-03-29 LAB — BASIC METABOLIC PANEL WITH GFR
Anion gap: 8 (ref 5–15)
BUN: 42 mg/dL — ABNORMAL HIGH (ref 8–23)
CO2: 31 mmol/L (ref 22–32)
Calcium: 7.7 mg/dL — ABNORMAL LOW (ref 8.9–10.3)
Chloride: 98 mmol/L (ref 98–111)
Creatinine, Ser: 2.26 mg/dL — ABNORMAL HIGH (ref 0.61–1.24)
GFR, Estimated: 28 mL/min — ABNORMAL LOW (ref >60)
Glucose, Bld: 102 mg/dL — ABNORMAL HIGH (ref 70–99)
Potassium: 4.5 mmol/L (ref 3.5–5.1)
Sodium: 137 mmol/L (ref 135–145)

## 2024-03-29 LAB — GLUCOSE, CAPILLARY
Glucose-Capillary: 84 mg/dL (ref 70–99)
Glucose-Capillary: 172 mg/dL — ABNORMAL HIGH (ref 70–99)
Glucose-Capillary: 187 mg/dL — ABNORMAL HIGH (ref 70–99)
Glucose-Capillary: 101 mg/dL — ABNORMAL HIGH (ref 70–99)

## 2024-03-29 MED ORDER — FUROSEMIDE 10 MG/ML IJ SOLN
80.0000 mg | Freq: Once | INTRAMUSCULAR | Status: AC
  Administered 2024-03-29: 80 mg via INTRAVENOUS
  Filled 2024-03-29: qty 8

## 2024-03-29 NOTE — Progress Notes (Signed)
 [facility]  - [place] at [facility]   PATIENT NAME: [name]    MR#:  [id number]  DATE OF BIRTH:  1935-05-22  SUBJECTIVE:  Reports breathing fine, tolerating diet, feet still somewhat swollen   VITALS:  Blood pressure 99/64, pulse 64, temperature 98.2 F (36.8 C), temperature source Oral, resp. rate 16, height 5\' 8"  (1.727 m), weight 98.8 kg, SpO2 100%.  PHYSICAL EXAMINATION:   GENERAL:  89 y.o.-year-old patient with no acute distress. Obese LUNGS: decreased breath sounds bilaterally, no wheezing CARDIOVASCULAR: S1, S2 normal. No murmur   ABDOMEN: Soft, nontender, nondistended.   EXTREMITIES: + edema of LEs improving NEUROLOGIC: nonfocal  patient is alert and awake SKIN: No obvious rash, lesion, or ulcer.   LABORATORY PANEL:  CBC Recent Labs  Lab 03/23/24 2043  WBC 5.5  HGB 12.7*  HCT 41.2  PLT 141*    Chemistries  Recent Labs  Lab 03/23/24 2043 03/24/24 0514 03/28/24 0518 03/29/24 0442  NA 141   < > 140 137  K 2.9*   < > 3.7 4.5  CL 104   < > 100 98  CO2 23   < > 29 31  GLUCOSE 143*   < > 69* 102*  BUN 48*   < > 39* 42*  CREATININE 2.55*   < > 2.01* 2.26*  CALCIUM  7.1*   < > 7.9* 7.7*  MG  --    < > 2.0  --   AST 20  --   --   --   ALT 10  --   --   --   ALKPHOS 60  --   --   --   BILITOT 0.8  --   --   --    < > = values in this interval not displayed.   Cardiac Enzymes No results for input(s): "TROPONINI" in the last 168 hours. RADIOLOGY:  No results found.   Assessment and Plan  [name] is a 89 y.o. 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  He also has dyspnea  on exertion and lower extremity edema.  Has been going up on his home torsemide  without improvement.    BNP over 4500  creatinine 2.55 up from baseline of 1.73,    Acute on chronic HFrEF (heart failure with reduced ejection fraction) (HCC) --IV Lasix --increased to 80 mg bid --Continue home carvedilol , Entresto , spironolactone  --Daily weights with intake and output monitoring --EF 25 to 30% on echo 12/2023 --follows with [facility] cardiology --HOLD farxiga  due to UTI --leg edema improving. Will cont IV lasix  one more day, probable d/c tomorrow on oral lasix    Acute renal failure superimposed on stage 3b chronic kidney disease (HCC) --Monitor renal function and avoid nephrotoxins --creat stable gfr low 30s   Hypokalemia Hypomagnesimia resolved   Scrotal edema Hydrocele, left --Had a recent scrotal ultrasound that showed hydrocele and varicocele.--followed by [facility] -- Patient would like to be seen by urologist while here --spoke with Urology PA and will schedule out pt appt for evaluation of Hydrocoelectomy--June 3rd with Dr Estanislao Heimlich   Urinary tract infection Culture finalized as proteus and staph haemolyticus yesterday. On cephalosporin but that won't treat the  staph. - switched to cipro on 5/21, plan 5 days tx   Pacemaker in place --No acute issues suspected   CAD s/p CABG, multiple vessel --No complaints of chest pain  --continue Imdur , Crestor , carvedilol  and apixaban    Paroxysmal atrial fibrillation (HCC) --Continue Coreg  and apixaban   T2DM Hypoglycemic overnight - decrease basal   DVT prophylaxis: Eliquis  Procedures: none Family communication : daughter veronica updated telephonically 5/21 Consults :none CODE STATUS: full Level of care: Progressive  Raymonde Calico M.D    [facility]   CC: Primary care physician; Herlinda Long, MD

## 2024-03-29 NOTE — Progress Notes (Signed)
 Physical Therapy Treatment Patient Details Name: [name] MRN: [id number] DOB: April 16, 1935 Today's Date: 03/29/2024   History of Present Illness Pt is an 89 y.o. male presenting to hospital 03/24/24 with c/o scrotal swelling and SOB.  Pt admitted with acute on chronic HFrEF, acute renal failure superimposed on stage 3b CKD, hypokalemia, scrotal edema, hydrocele, UTI.  PMH includes CAD s/p CABG x5, htn, CHF, T2DM, PAF, pacemaker, CKD, back sx.    PT Comments  Pt in recliner on arrival where he has been for quite some time, pt still feeling fatigued, painful in feet, but agreeable to session.  Pt able to advance volume of mobility today, however continues to require heavy to max effort to perform and recovery breaks. Pt AMB 50ft, then after a break ~ 36ft. Pt assisted back to bed at end of session, all needs met.    If plan is discharge home, recommend the following: A little help with walking and/or transfers;A little help with bathing/dressing/bathroom;Assistance with cooking/housework;Assist for transportation;Help with stairs or ramp for entrance   Can travel by private vehicle        Equipment Recommendations  Rolling walker (2 wheels)    Recommendations for Other Services       Precautions / Restrictions Precautions Precautions: Fall Recall of Precautions/Restrictions: Intact Restrictions Weight Bearing Restrictions Per Provider Order: No     Mobility  Bed Mobility Overal bed mobility: Needs Assistance Bed Mobility: Sit to Supine       Sit to supine: Supervision        Transfers Overall transfer level: Needs assistance Equipment used: Rolling walker (2 wheels) Transfers: Sit to/from Stand Sit to Stand: Contact guard assist, From elevated surface           General transfer comment: multiple in session, max effort, no technique cues    Ambulation/Gait   Gait Distance (Feet): 50 Feet (then a 24ft walk after) Assistive device: Rolling walker (2  wheels) Gait Pattern/deviations: Step-to pattern       General Gait Details: 3-point pattern   Stairs             Wheelchair Mobility     Tilt Bed    Modified Rankin (Stroke Patients Only)       Balance                                            Communication    Cognition                                        Cueing    Exercises Other Exercises Other Exercises: STS from elevated EOB x5    General Comments        Pertinent Vitals/Pain Pain Assessment Pain Assessment: Faces Faces Pain Scale: Hurts even more Pain Location: foot pain Pain Intervention(s): Limited activity within patient's tolerance    Home Living                          Prior Function            PT Goals (current goals can now be found in the care plan section) Acute Rehab PT Goals Patient Stated Goal: to improve LE swelling and walking PT Goal Formulation: With patient Time For  Goal Achievement: 04/09/24 Potential to Achieve Goals: Good Progress towards PT goals: Progressing toward goals    Frequency    Min 2X/week      PT Plan      Co-evaluation              AM-PAC PT "6 Clicks" Mobility   Outcome Measure  Help needed turning from your back to your side while in a flat bed without using bedrails?: A Little Help needed moving from lying on your back to sitting on the side of a flat bed without using bedrails?: A Little Help needed moving to and from a bed to a chair (including a wheelchair)?: A Lot Help needed standing up from a chair using your arms (e.g., wheelchair or bedside chair)?: A Lot Help needed to walk in hospital room?: A Lot Help needed climbing 3-5 steps with a railing? : A Lot 6 Click Score: 14    End of Session   Activity Tolerance: Patient limited by fatigue;Patient tolerated treatment well;Patient limited by pain Patient left: in bed;with call bell/phone within reach;with bed alarm set Nurse  Communication: Mobility status;Precautions;Patient requests pain meds PT Visit Diagnosis: Other abnormalities of gait and mobility (R26.89);Muscle weakness (generalized) (M62.81);History of falling (Z91.81);Pain Pain - part of body: Ankle and joints of foot     Time: 1510-1527 PT Time Calculation (min) (ACUTE ONLY): 17 min  Charges:    $Therapeutic Activity: 8-22 mins PT General Charges $$ ACUTE PT VISIT: 1 Visit                3:40 PM, 03/29/24 Dawn Eth, PT, DPT Physical Therapist - [facility]  [phone] (ASCOM)    Olia Hinderliter C 03/29/2024, 3:38 PM

## 2024-03-29 NOTE — Plan of Care (Signed)

## 2024-03-29 NOTE — TOC Progression Note (Addendum)
 Transition of Care [facility]) - Progression Note    Patient Details  Name: [name] MRN: [id number] Date of Birth: [birth year]  [facility]) CM/SW Contact  Odilia Bennett, LCSW Phone Number: 03/29/2024, 3:25 PM  Clinical Narrative: LATE ENTRY: CSW spoke to patient and wife on 5/20. They are agreeable to resume home health with [facility]. Patient is agreeable to RW.   3:46 pm: CSW ordered RW through [employer].  4:32 pm: Patient received a rollator in 2021 so he would have to pay privately for RW. CSW left voicemail for wife to see if she wants to do that.  Expected Discharge Plan: Home w Home Health Services    Expected Discharge Plan and Services                                               Social Determinants of Health (SDOH) Interventions SDOH Screenings   Food Insecurity: No Food Insecurity (03/25/2024)  Housing: Low Risk  (03/25/2024)  Transportation Needs: No Transportation Needs (03/25/2024)  Utilities: Not At Risk (03/25/2024)  Depression (PHQ2-9): Low Risk  (04/07/2022)  Financial Resource Strain: Low Risk  (06/29/2021)   Received from [facility]  Physical Activity: Inactive (07/27/2019)  Social Connections: Unknown (03/25/2024)  Stress: No Stress Concern Present (07/27/2019)  Tobacco Use: Medium Risk (03/23/2024)    Readmission Risk Interventions    03/26/2024    4:09 PM 03/10/2023   12:47 PM 03/29/2022    1:18 PM  Readmission Risk Prevention Plan  Transportation Screening Complete Complete Complete  PCP or Specialist Appt within 3-5 Days  Complete Complete  HRI or Home Care Consult   --  Social Work Consult for Recovery Care Planning/Counseling  Complete --  Palliative Care Screening  Not Applicable Not Applicable  Medication Review Oceanographer) Complete Complete Complete  PCP or Specialist appointment within 3-5 days of discharge Complete    HRI or Home Care Consult Complete    SW Recovery Care/Counseling Consult  Complete    Palliative Care Screening Not Applicable    Skilled Nursing Facility Not Applicable
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
## 2024-03-30 ENCOUNTER — Other Ambulatory Visit: Payer: Self-pay

## 2024-03-30 DIAGNOSIS — I5023 Acute on chronic systolic (congestive) heart failure: Secondary | ICD-10-CM | POA: Diagnosis not present

## 2024-03-30 LAB — BASIC METABOLIC PANEL WITH GFR
Anion gap: 8 (ref 5–15)
BUN: 43 mg/dL — ABNORMAL HIGH (ref 8–23)
CO2: 30 mmol/L (ref 22–32)
Calcium: 7.9 mg/dL — ABNORMAL LOW (ref 8.9–10.3)
Chloride: 100 mmol/L (ref 98–111)
Creatinine, Ser: 2.35 mg/dL — ABNORMAL HIGH (ref 0.61–1.24)
GFR, Estimated: 26 mL/min — ABNORMAL LOW (ref >60)
Glucose, Bld: 122 mg/dL — ABNORMAL HIGH (ref 70–99)
Potassium: 4.3 mmol/L (ref 3.5–5.1)
Sodium: 138 mmol/L (ref 135–145)

## 2024-03-30 LAB — GLUCOSE, CAPILLARY
Glucose-Capillary: 88 mg/dL (ref 70–99)
Glucose-Capillary: 184 mg/dL — ABNORMAL HIGH (ref 70–99)

## 2024-03-30 MED ORDER — CIPROFLOXACIN HCL 250 MG PO TABS
250.0000 mg | ORAL_TABLET | Freq: Two times a day (BID) | ORAL | 0 refills | Status: AC
  Filled 2024-03-30: qty 6, 3d supply, fill #0

## 2024-03-30 NOTE — TOC Transition Note (Addendum)
 Transition of Care [facility]) - Discharge Note   Patient Details  Name: [name] MRN: [id number] Date of Birth: 1935-04-14  Transition of Care [facility]) CM/SW Contact:  Tayna Smethurst C Jozlyn Schatz, RN Phone Number: 03/30/2024, 1:39 PM   Clinical Narrative:    [name] with Verdis Glade from [facility] to notify of patient's discharge home today.   Spoke with patient regarding rolling walker. He declined.   TOC signing off    Final next level of care: Home w Home Health Services Barriers to Discharge: Barriers Resolved   Patient Goals and CMS Choice Patient states their goals for this hospitalization and ongoing recovery are:: Home Health CMS Medicare.gov Compare Post Acute Care list provided to:: Patient Represenative (must comment)        Discharge Placement                       Discharge Plan and Services Additional resources added to the After Visit Summary for                            [facility] Arranged: PT, RN [facility] Agency: [facility] Date [facility] Agency Contacted: 03/30/24 Time HH Agency Contacted: 1339 Representative spoke with at [facility] Agency: Verdis Glade  Social Drivers of Health (SDOH) Interventions SDOH Screenings   Food Insecurity: No Food Insecurity (03/25/2024)  Housing: Low Risk  (03/25/2024)  Transportation Needs: No Transportation Needs (03/25/2024)  Utilities: Not At Risk (03/25/2024)  Depression (PHQ2-9): Low Risk  (04/07/2022)  Financial Resource Strain: Low Risk  (06/29/2021)   [facility]  Physical Activity: Inactive (07/27/2019)  Social Connections: Unknown (03/25/2024)  Stress: No Stress Concern Present (07/27/2019)  Tobacco Use: Medium Risk (03/23/2024)     Readmission Risk Interventions    03/26/2024    4:09 PM 03/10/2023   12:47 PM 03/29/2022    1:18 PM  Readmission Risk Prevention Plan  Transportation Screening Complete Complete Complete  PCP or Specialist Appt within 3-5 Days  Complete Complete  HRI or Home Care Consult   --   Social Work Consult for Recovery Care Planning/Counseling  Complete --  Palliative Care Screening  Not Applicable Not Applicable  Medication Review Oceanographer) Complete Complete Complete  PCP or Specialist appointment within 3-5 days of discharge Complete    HRI or Home Care Consult Complete    SW Recovery Care/Counseling Consult Complete    Palliative Care Screening Not Applicable    Skilled Nursing Facility Not Applicable

## 2024-03-30 NOTE — Progress Notes (Signed)
 Physical Therapy Treatment Patient Details Name: [name] MRN: [id number] DOB: 1935-10-17 Today's Date: 03/30/2024   History of Present Illness Pt is an 89 y.o. male presenting to hospital 03/24/24 with c/o scrotal swelling and SOB.  Pt admitted with acute on chronic HFrEF, acute renal failure superimposed on stage 3b CKD, hypokalemia, scrotal edema, hydrocele, UTI.  PMH includes CAD s/p CABG x5, htn, CHF, T2DM, PAF, pacemaker, CKD, back sx.    PT Comments  Pt was pleasant and motivated to participate during the session and put forth good effort throughout. Pt required extra time and effort with bed mobility tasks and transfers with min cuing for hand placement but required no physical assistance.  Pt was steady with limited ambulation this session with distance limited secondary to focus being on stair training/assessment prior to discharge to ensure pt has capacity for safe home entry/exit.  Pt confirmed that he has 5 steps to enter the home but stated he has bilateral rails that he can reach at the same time.  Pt was able to ascend and descend 4 steps with B rails with alternating pattern with min cues for sequencing per below.  No overt instability or LE buckling noted.  Pt reported no adverse symptoms during the session other than mild foot pain with SpO2 and HR WNL throughout on room air.  Pt will benefit from continued PT services upon discharge to safely address deficits listed in patient problem list for decreased caregiver assistance and eventual return to PLOF.       If plan is discharge home, recommend the following: A little help with walking and/or transfers;A little help with bathing/dressing/bathroom;Assistance with cooking/housework;Assist for transportation;Help with stairs or ramp for entrance   Can travel by private vehicle        Equipment Recommendations  Rolling walker (2 wheels)    Recommendations for Other Services       Precautions / Restrictions  Precautions Precautions: Fall Recall of Precautions/Restrictions: Intact Restrictions Weight Bearing Restrictions Per Provider Order: No     Mobility  Bed Mobility Overal bed mobility: Needs Assistance Bed Mobility: Sit to Supine       Sit to supine: Supervision   General bed mobility comments: Extra time and effort but no physical assist needed    Transfers Overall transfer level: Needs assistance Equipment used: Rolling walker (2 wheels) Transfers: Sit to/from Stand Sit to Stand: Contact guard assist           General transfer comment: Min verbal cues for hand placement but no physical assist needed    Ambulation/Gait Ambulation/Gait assistance: Contact guard assist Gait Distance (Feet): 10 Feet Assistive device: Rolling walker (2 wheels) Gait Pattern/deviations: Step-through pattern, Decreased step length - right, Decreased step length - left Gait velocity: decreased     General Gait Details: Slow cadence but steady without LOB; distance limited secondary to focus being on stair assessment prior to discharge   Stairs Stairs: Yes Stairs assistance: Contact guard assist Stair Management: Two rails, Alternating pattern, Forwards Number of Stairs: 4 General stair comments: Slow cadence with min verbal cues for optimal hand positioning while descending steps but steady with no LOB or buckling   Wheelchair Mobility     Tilt Bed    Modified Rankin (Stroke Patients Only)       Balance Overall balance assessment: Needs assistance Sitting-balance support: No upper extremity supported, Feet supported Sitting balance-[name]: Good     Standing balance support: Bilateral upper extremity supported, Reliant on assistive device  for balance, During functional activity Standing balance-[name]: Good                              Communication Communication Factors Affecting Communication: Reduced clarity of speech  Cognition Arousal:  Alert Behavior During Therapy: WFL for tasks assessed/performed   PT - Cognitive impairments: No apparent impairments                         Following commands: Intact      Cueing Cueing Techniques: Verbal cues, Visual cues  Exercises      General Comments        Pertinent Vitals/Pain Pain Assessment Pain Assessment: Faces Faces Pain Scale: Hurts little more Pain Location: foot pain Pain Descriptors / Indicators: Sore Pain Intervention(s): Repositioned, Premedicated before session, Monitored during session    Home Living                          Prior Function            PT Goals (current goals can now be found in the care plan section) Progress towards PT goals: Progressing toward goals    Frequency    Min 2X/week      PT Plan      Co-evaluation              AM-PAC PT "6 Clicks" Mobility   Outcome Measure  Help needed turning from your back to your side while in a flat bed without using bedrails?: A Little Help needed moving from lying on your back to sitting on the side of a flat bed without using bedrails?: A Little Help needed moving to and from a bed to a chair (including a wheelchair)?: A Little Help needed standing up from a chair using your arms (e.g., wheelchair or bedside chair)?: A Little Help needed to walk in hospital room?: A Little Help needed climbing 3-5 steps with a railing? : A Little 6 Click Score: 18    End of Session Equipment Utilized During Treatment: Gait belt Activity Tolerance: Patient tolerated treatment well Patient left: in chair;with call bell/phone within reach;with chair alarm set Nurse Communication: Mobility status PT Visit Diagnosis: Other abnormalities of gait and mobility (R26.89);Muscle weakness (generalized) (M62.81);History of falling (Z91.81);Pain Pain - part of body: Ankle and joints of foot     Time: 1449-1518 PT Time Calculation (min) (ACUTE ONLY): 29 min  Charges:    $Gait  Training: 23-37 mins PT General Charges $$ ACUTE PT VISIT: 1 Visit                     D. Scott Asna Muldrow PT, DPT 03/30/24, 3:59 PM

## 2024-03-30 NOTE — Discharge Summary (Signed)
 [name] ZOX:[id number] DOB: [birth year] DOA: 03/24/2024  PCP: Herlinda Long, MD  Admit date: 03/24/2024 Discharge date: 03/30/2024  Time spent: 35 minutes  Recommendations for Outpatient Follow-up:  Pcp f/u Heart failure clinic f/u 5/28 as scheduled  Urology f/u 6/3 as scheduled Bmp one week    Discharge Diagnoses:  Principal Problem:   Acute on chronic HFrEF (heart failure with reduced ejection fraction) (HCC) Active Problems:   Acute renal failure superimposed on stage 3b chronic kidney disease (HCC)   Hypokalemia   Scrotal edema   Urinary tract infection   Discharge Condition: improved  Diet recommendation: heart healthy  Filed Weights   03/29/24 0500 03/29/24 1206 03/30/24 0500  Weight: 99.6 kg 98.8 kg 99.2 kg    History of present illness:  From admission h and p [name] is a 89 y.o. 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  He also has dyspnea on exertion and lower extremity edema.  Has been going up on his home torsemide  without improvement.  He denies chest pain   Hospital Course:   Acute on chronic HFrEF (heart failure with reduced ejection fraction) (HCC) Treated with IV lasix , net negative 14 liters. Symptomatically much improved, now with slight up-trend in bun/cr and bicarb suggesting we have maximized diuresis - resume home meds tomorrow, hold farxiga  2/2 uti - chf clinic f/u 5/28 as scheduled   Acute renal failure superimposed on stage 3b chronic kidney disease (HCC) --bmp one week    Scrotal edema Hydrocele, left --Had a recent scrotal ultrasound that showed hydrocele and  varicocele.--followed by [facility] -- Patient would like to be seen by urologist while here --spoke with Urology PA and will schedule out pt appt for evaluation of Hydrocoelectomy--June 3rd with Dr Estanislao Heimlich   Urinary tract infection Culture finalized as proteus and staph haemolyticus yesterday. On cephalosporin here but that won't treat the staph so transitioned to cipro to complete a 5 day course   Pacemaker in place   CAD s/p CABG, multiple vessel --No complaints of chest pain  --continue Imdur , Crestor , carvedilol  and apixaban    Paroxysmal atrial fibrillation (HCC) --Continue Coreg  and apixaban    T2DM controlled  Procedures: none   Consultations: none  Discharge Exam: Vitals:   03/30/24 0757 03/30/24 0900  BP: 111/62   Pulse: (!) 117   Resp:  18  Temp: 98.9 F (37.2 C)   SpO2: 93%     GENERAL:  89 y.o.-year-old patient with no acute distress. Obese LUNGS: decreased breath sounds bilaterally, no wheezing CARDIOVASCULAR: S1, S2 normal. No murmur   ABDOMEN: Soft, nontender, nondistended.   EXTREMITIES: + edema of LEs improving NEUROLOGIC: nonfocal  patient is alert and awake SKIN: No obvious rash, lesion, or ulcer.   Discharge Instructions   Discharge Instructions     Diet - low sodium heart healthy   Complete by: As directed    For home use only DME 4 wheeled rolling walker with seat   Complete by: As directed    Patient needs a walker to treat with the following condition: CHF (congestive heart failure) (HCC)   Increase activity slowly   Complete by: As directed  Allergies as of 03/30/2024       Reactions   Angiotensin Receptor Blockers    hyperkalemia   Spironolactone     Hyperkalemia Pt states he is not allergic   Metformin Diarrhea        Medication List     PAUSE taking these medications    dapagliflozin  propanediol 10 MG Tabs tablet Wait to take this until your doctor or other care provider tells you to start again. Commonly known as:  Farxiga  Take 1 tablet (10 mg total) by mouth daily.       TAKE these medications    acetaminophen  500 MG tablet Commonly known as: TYLENOL  Take 1,000 mg by mouth every 6 (six) hours as needed for mild pain, fever or headache.   acyclovir  800 MG tablet Commonly known as: ZOVIRAX  Take 800 mg by mouth daily.   apixaban  2.5 MG Tabs tablet Commonly known as: Eliquis  Take 1 tablet (2.5 mg total) by mouth 2 (two) times daily.   aspirin  EC 81 MG tablet Take 81 mg by mouth daily. Swallow whole.   Basaglar  KwikPen 100 UNIT/ML Inject 15 Units into the skin at bedtime.   carvedilol  6.25 MG tablet Commonly known as: COREG  TAKE 1 TABLET BY MOUTH 2 TIMES DAILY WITH A MEAL.   ciprofloxacin 250 MG tablet Commonly known as: CIPRO Take 1 tablet (250 mg total) by mouth 2 (two) times daily for 6 doses.   Entresto  49-51 MG Generic drug: sacubitril -valsartan  Take 1 tablet by mouth 2 (two) times daily.   ezetimibe  10 MG tablet Commonly known as: ZETIA  TAKE 1 TABLET BY MOUTH EVERY DAY   magnesium  oxide 400 MG tablet Commonly known as: MAG-OX Take 1 tablet (400 mg total) by mouth daily.   metolazone  2.5 MG tablet Commonly known as: ZAROXOLYN  Take 2.5 mg by mouth every 7 (seven) days. (Tuesday)   pantoprazole  40 MG tablet Commonly known as: PROTONIX  Take 40 mg by mouth daily.   polyethylene glycol 17 g packet Commonly known as: MIRALAX  / GLYCOLAX  Take 17 g by mouth daily as needed for moderate constipation.   potassium chloride  SA 20 MEQ tablet Commonly known as: KLOR-CON  M Take 1 tablet (20 mEq total) by mouth daily. Reduced from 40 mEq daily.   Repatha 140 MG/ML Sosy Generic drug: Evolocumab Inject into the skin.   Repatha SureClick 140 MG/ML Soaj Generic drug: Evolocumab Inject 140 mg into the skin.   rosuvastatin  40 MG tablet Commonly known as: CRESTOR  Take 1 tablet (40 mg total) by mouth daily.   spironolactone  25 MG tablet Commonly known as: ALDACTONE  Take 1  tablet (25 mg total) by mouth daily.   tamsulosin  0.4 MG Caps capsule Commonly known as: FLOMAX  Take 0.4 mg by mouth daily.   torsemide  20 MG tablet Commonly known as: DEMADEX  Take 1 tablet (20 mg total) by mouth daily. Reduced from 40 mg daily. What changed:  how much to take additional instructions               Durable Medical Equipment  (From admission, onward)           Start     Ordered   03/30/24 0000  For home use only DME 4 wheeled rolling walker with seat       Question:  Patient needs a walker to treat with the following condition  Answer:  CHF (congestive heart failure) (HCC)   03/30/24 1039   03/29/24 1541  For home use only DME Walker rolling  Once  Question Answer Comment  Walker: With 5 Inch Wheels   Patient needs a walker to treat with the following condition CHF (congestive heart failure) (HCC)      03/29/24 1540           Allergies  Allergen Reactions   Angiotensin Receptor Blockers     hyperkalemia   Spironolactone      Hyperkalemia Pt states he is not allergic   Metformin Diarrhea    Follow-up Information     Herlinda Long, MD. Schedule an appointment as soon as possible for a visit in 1 week(s).   Specialty: Internal Medicine Contact information: [address] [place] [address] [phone]         Lawerence Pressman, MD Follow up on 04/10/2024.   Specialty: Urology Why: at 1:15 pm for left [place] Contact information: [address] [place] [address] [phone]         Darlis Eisenmenger, MD Follow up.   Specialty: Cardiology Why: on 5/28 as scheduled Contact information: [address] [place] [address] [phone]         [facility] Follow up.   Specialty: Home Health Services Why: They will follow up with you for your home health therapy needs. Contact information: [address] [place] [address] [phone]                   The results of significant diagnostics from this hospitalization (including imaging, microbiology, ancillary and laboratory) are listed below for reference.    Significant Diagnostic Studies: DG Chest Port 1 View Result Date: 03/24/2024 CLINICAL DATA:  Dyspnea EXAM: PORTABLE CHEST 1 VIEW COMPARISON:  12/22/2023 FINDINGS: Focal pathway within the right mid lung zone appears stable, likely reflecting parenchymal scarring. Lungs are otherwise clear. No pneumothorax or pleural effusion. Stable cardiomegaly. Coronary artery bypass grafting and left atrial clipping has been performed. Left subclavian 3 lead pacemaker is unchanged. Pulmonary vascularity is normal. No acute bone abnormality. IMPRESSION: 1. No active disease. 2. Stable cardiomegaly. Electronically Signed   By: Worthy Heads M.D.   On: 03/24/2024 01:29    Microbiology: Recent Results (from the past 240 hours)  Urine Culture     Status: Abnormal   Collection Time: 03/24/24 12:35 AM   Specimen: Urine, Clean Catch  Result Value Ref Range Status   Specimen Description   Final    URINE, CLEAN CATCH Performed at [facility], [address], [place], [place] [address]    Special Requests   Final    NONE Performed at [facility], [address] [place], [place] [address]    Culture (A)  Final    >=100,000 COLONIES/mL PROTEUS MIRABILIS >=100,000 COLONIES/mL STAPHYLOCOCCUS HAEMOLYTICUS    Report Status 03/27/2024 FINAL  Final   Organism ID, Bacteria PROTEUS MIRABILIS (A)  Final   Organism ID, Bacteria STAPHYLOCOCCUS HAEMOLYTICUS (A)  Final      Susceptibility   Proteus mirabilis - MIC*    AMPICILLIN  <=2 SENSITIVE Sensitive     CEFAZOLIN  <=4 SENSITIVE Sensitive     CEFEPIME <=0.12 SENSITIVE Sensitive     CEFTRIAXONE  <=0.25 SENSITIVE Sensitive     CIPROFLOXACIN <=0.25 SENSITIVE Sensitive     GENTAMICIN  <=1 SENSITIVE Sensitive     IMIPENEM 1 SENSITIVE Sensitive     NITROFURANTOIN RESISTANT Resistant      TRIMETH/SULFA <=20 SENSITIVE Sensitive     AMPICILLIN /SULBACTAM <=2 SENSITIVE Sensitive  PIP/TAZO <=4 SENSITIVE Sensitive ug/mL    * >=100,000 COLONIES/mL PROTEUS MIRABILIS   Staphylococcus haemolyticus - MIC*    CIPROFLOXACIN <=0.5 SENSITIVE Sensitive     GENTAMICIN  <=0.5 SENSITIVE Sensitive     NITROFURANTOIN <=16 SENSITIVE Sensitive     OXACILLIN 0.5 RESISTANT Resistant     TETRACYCLINE >=16 RESISTANT Resistant     VANCOMYCIN  <=0.5 SENSITIVE Sensitive     TRIMETH/SULFA <=10 SENSITIVE Sensitive     RIFAMPIN <=0.5 SENSITIVE Sensitive     Inducible Clindamycin NEGATIVE Sensitive     * >=100,000 COLONIES/mL STAPHYLOCOCCUS HAEMOLYTICUS     Labs: Basic Metabolic Panel: Recent Labs  Lab 03/25/24 0448 03/26/24 0347 03/27/24 0804 03/28/24 0518 03/29/24 0442 03/30/24 0347  NA 143 140 138 140 137 138  K 3.3* 3.3* 4.0 3.7 4.5 4.3  CL 105 104 101 100 98 100  CO2 26 26 26 29 31 30   GLUCOSE 194* 156* 103* 69* 102* 122*  BUN 48* 44* 42* 39* 42* 43*  CREATININE 2.22* 2.16* 1.91* 2.01* 2.26* 2.35*  CALCIUM  6.8* 6.8* 7.3* 7.9* 7.7* 7.9*  MG 1.4* 1.6* 2.0 2.0  --   --   PHOS 3.1  --   --   --   --   --    Liver Function Tests: Recent Labs  Lab 03/23/24 2043 03/25/24 0448  AST 20  --   ALT 10  --   ALKPHOS 60  --   BILITOT 0.8  --   PROT 6.6  --   ALBUMIN  3.1* 2.4*   No results for input(s): "LIPASE", "AMYLASE" in the last 168 hours. No results for input(s): "AMMONIA" in the last 168 hours. CBC: Recent Labs  Lab 03/23/24 2043  WBC 5.5  NEUTROABS 3.7  HGB 12.7*  HCT 41.2  MCV 92.6  PLT 141*   Cardiac Enzymes: No results for input(s): "CKTOTAL", "CKMB", "CKMBINDEX", "TROPONINI" in the last 168 hours. BNP: BNP (last 3 results) Recent Labs    12/14/23 1146 12/22/23 1555 03/23/24 2040  BNP 3,946.6* 3,093.2* >4,500.0*    ProBNP (last 3 results) Recent Labs    08/09/23 1058  PROBNP 6,649*    CBG: Recent Labs  Lab 03/29/24 0815 03/29/24 1153  03/29/24 1534 03/29/24 2011 03/30/24 0757  GLUCAP 84 172* 187* 101* 88       Signed:  Raymonde Calico MD.  [facility] 03/30/2024, 10:45 AM

## 2024-03-30 NOTE — Plan of Care (Signed)
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
## 2024-03-30 NOTE — Progress Notes (Signed)
 Occupational Therapy Treatment Patient Details Name: [name] MRN: [id number] DOB: 08-29-1935 Today's Date: 03/30/2024   History of present illness Pt is an 89 y.o. male presenting to hospital 03/24/24 with c/o scrotal swelling and SOB.  Pt admitted with acute on chronic HFrEF, acute renal failure superimposed on stage 3b CKD, hypokalemia, scrotal edema, hydrocele, UTI.  PMH includes CAD s/p CABG x5, htn, CHF, T2DM, PAF, pacemaker, CKD, back sx.   OT comments  Pt is seated in recliner on arrival. Pleasant and agreeable to OT session. He continues to have pain in his feet. Pt performed STS from recliner with supervision. He was able to perform all LB dressing with supervision in seated/standing without LOB. Edu pt and wife on use of RW for safety with all mobility for safe return home and where to purchase. Wife present and awaiting for pt to DC this afternoon. Pt left with nurse in room and all needs in place and will cont to require skilled acute OT services to maximize his safety and IND to return to PLOF.        If plan is discharge home, recommend the following:  A little help with walking and/or transfers;Assistance with cooking/housework;Help with stairs or ramp for entrance;A little help with bathing/dressing/bathroom   Equipment Recommendations  Other (comment) (RW)    Recommendations for Other Services      Precautions / Restrictions Precautions Precautions: Fall Recall of Precautions/Restrictions: Intact Restrictions Weight Bearing Restrictions Per Provider Order: No       Mobility Bed Mobility               General bed mobility comments: deferred pt up in recliner pre/post session preparing for DC    Transfers Overall transfer level: Needs assistance Equipment used: Rolling walker (2 wheels) Transfers: Sit to/from Stand Sit to Stand: Supervision           General transfer comment: no physical assisted needed, able to stand from recliner with  supervision     Balance Overall balance assessment: Needs assistance Sitting-balance support: No upper extremity supported, Feet supported Sitting balance-[name]: Good     Standing balance support: Bilateral upper extremity supported, Reliant on assistive device for balance, During functional activity Standing balance-[name]: Good Standing balance comment: steady in standing without UE support for LB dressing tasks                           ADL either performed or assessed with clinical judgement   ADL Overall ADL's : Needs assistance/impaired                     Lower Body Dressing: Supervision/safety;Sitting/lateral leans Lower Body Dressing Details (indicate cue type and reason): doffed socks, donned underwear and pants, donned slippers                    Extremity/Trunk Assessment              Vision       Perception     Praxis     Communication Communication Factors Affecting Communication: Reduced clarity of speech   Cognition Arousal: Alert Behavior During Therapy: WFL for tasks assessed/performed                                 Following commands: Intact        Cueing   Cueing  Techniques: Verbal cues, Visual cues  Exercises Other Exercises Other Exercises: Edu on use of RW for safety with all mobility upon return home to prevent falls.    Shoulder Instructions       General Comments      Pertinent Vitals/ Pain       Pain Assessment Pain Assessment: Faces Faces Pain Scale: Hurts little more Pain Location: foot pain Pain Descriptors / Indicators: Sore Pain Intervention(s): Monitored during session, Repositioned  Home Living                                          Prior Functioning/Environment              Frequency  Min 2X/week        Progress Toward Goals  OT Goals(current goals can now be found in the care plan section)  Progress towards OT goals:  Progressing toward goals  Acute Rehab OT Goals Patient Stated Goal: return home OT Goal Formulation: With patient Time For Goal Achievement: 04/10/24 Potential to Achieve Goals: Good  Plan      Co-evaluation                 AM-PAC OT "6 Clicks" Daily Activity     Outcome Measure   Help from another person eating meals?: None Help from another person taking care of personal grooming?: None Help from another person toileting, which includes using toliet, bedpan, or urinal?: A Little Help from another person bathing (including washing, rinsing, drying)?: A Little Help from another person to put on and taking off regular upper body clothing?: None Help from another person to put on and taking off regular lower body clothing?: None 6 Click Score: 22    End of Session Equipment Utilized During Treatment: Gait belt;Rolling walker (2 wheels)  OT Visit Diagnosis: Other abnormalities of gait and mobility (R26.89)   Activity Tolerance Patient tolerated treatment well   Patient Left with call bell/phone within reach;in chair;with nursing/sitter in room;with family/visitor present   Nurse Communication Mobility status        Time: 1538-1550 OT Time Calculation (min): 12 min  Charges: OT General Charges $OT Visit: 1 Visit OT Treatments $Self Care/Home Management : 8-22 mins  Price [place], OTR/L  03/30/24, 4:00 PM  [name] 03/30/2024, 3:58 PM

## 2024-03-30 NOTE — TOC Progression Note (Addendum)
 Transition of Care [facility]) - Progression Note    Patient Details  Name: [name] MRN: [id number] Date of Birth: 02-Nov-1935  Transition of Care [facility] CM/SW Contact  Baird Bombard, RN Phone Number: 03/30/2024, 9:09 AM  Clinical Narrative:    Attempt to reach patient's wife regarding RW, no answer, left a message.   Attempt to reach family at bedside.No family present.   11:08pm  Attempt to reach Mrs. Esker by phone. No answer. Left a message.      Expected Discharge Plan: Home w Home Health Services    Expected Discharge Plan and Services                                               Social Determinants of Health (SDOH) Interventions SDOH Screenings   Food Insecurity: No Food Insecurity (03/25/2024)  Housing: Low Risk  (03/25/2024)  Transportation Needs: No Transportation Needs (03/25/2024)  Utilities: Not At Risk (03/25/2024)  Depression (PHQ2-9): Low Risk  (04/07/2022)  Financial Resource Strain: Low Risk  (06/29/2021)   Received from [facility]  Physical Activity: Inactive (07/27/2019)  Social Connections: Unknown (03/25/2024)  Stress: No Stress Concern Present (07/27/2019)  Tobacco Use: Medium Risk (03/23/2024)    Readmission Risk Interventions    03/26/2024    4:09 PM 03/10/2023   12:47 PM 03/29/2022    1:18 PM  Readmission Risk Prevention Plan  Transportation Screening Complete Complete Complete  PCP or Specialist Appt within 3-5 Days  Complete Complete  HRI or Home Care Consult   --  Social Work Consult for Recovery Care Planning/Counseling  Complete --  Palliative Care Screening  Not Applicable Not Applicable  Medication Review Oceanographer) Complete Complete Complete  PCP or Specialist appointment within 3-5 days of discharge Complete    HRI or Home Care Consult Complete    SW Recovery Care/Counseling Consult Complete    Palliative Care Screening Not Applicable    Skilled Nursing Facility Not Applicable

## 2024-03-30 NOTE — Plan of Care (Signed)
  Problem: Education: Goal: Ability to describe self-care measures that may prevent or decrease complications (Diabetes Survival Skills Education) will improve Outcome: Progressing   Problem: Coping: Goal: Ability to adjust to condition or change in health will improve Outcome: Progressing   Problem: Health Behavior/Discharge Planning: Goal: Ability to identify and utilize available resources and services will improve Outcome: Progressing Goal: Ability to manage health-related needs will improve Outcome: Progressing   Problem: Nutritional: Goal: Maintenance of adequate nutrition will improve Outcome: Progressing Goal: Progress toward achieving an optimal weight will improve Outcome: Progressing

## 2024-04-02 ENCOUNTER — Other Ambulatory Visit: Payer: Self-pay | Admitting: Family

## 2024-04-03 ENCOUNTER — Telehealth: Payer: Self-pay | Admitting: Cardiology

## 2024-04-03 NOTE — Telephone Encounter (Signed)
 Called to confirm/remind patient of their appointment at the [facility] on 04/04/24.   Appointment:   [x] Confirmed  [] Left mess   [] No answer/No voice mail  [] VM Full/unable to leave message  [] Phone not in service  Patient reminded to bring all medications and/or complete list.  Confirmed patient has transportation. Gave directions, instructed to utilize valet parking.

## 2024-04-04 ENCOUNTER — Other Ambulatory Visit
Admission: RE | Admit: 2024-04-04 | Discharge: 2024-04-04 | Disposition: A | Discharge status: Home / Self Care | Source: Ambulatory Visit | Attending: Cardiology | Admitting: Cardiology

## 2024-04-04 ENCOUNTER — Ambulatory Visit (HOSPITAL_BASED_OUTPATIENT_CLINIC_OR_DEPARTMENT_OTHER): Admitting: Cardiology

## 2024-04-04 ENCOUNTER — Ambulatory Visit (HOSPITAL_COMMUNITY): Payer: Self-pay | Admitting: Cardiology

## 2024-04-04 VITALS — BP 113/64 | HR 68 | Wt 211.2 lb

## 2024-04-04 DIAGNOSIS — I480 Paroxysmal atrial fibrillation: Secondary | ICD-10-CM | POA: Diagnosis not present

## 2024-04-04 DIAGNOSIS — Z7984 Long term (current) use of oral hypoglycemic drugs: Secondary | ICD-10-CM | POA: Diagnosis not present

## 2024-04-04 DIAGNOSIS — N183 Chronic kidney disease, stage 3 unspecified: Secondary | ICD-10-CM | POA: Insufficient documentation

## 2024-04-04 DIAGNOSIS — I5022 Chronic systolic (congestive) heart failure: Secondary | ICD-10-CM | POA: Insufficient documentation

## 2024-04-04 DIAGNOSIS — I2510 Atherosclerotic heart disease of native coronary artery without angina pectoris: Secondary | ICD-10-CM | POA: Diagnosis not present

## 2024-04-04 DIAGNOSIS — Z951 Presence of aortocoronary bypass graft: Secondary | ICD-10-CM | POA: Insufficient documentation

## 2024-04-04 DIAGNOSIS — I130 Hypertensive heart and chronic kidney disease with heart failure and stage 1 through stage 4 chronic kidney disease, or unspecified chronic kidney disease: Secondary | ICD-10-CM | POA: Insufficient documentation

## 2024-04-04 DIAGNOSIS — Z7901 Long term (current) use of anticoagulants: Secondary | ICD-10-CM | POA: Insufficient documentation

## 2024-04-04 DIAGNOSIS — R0602 Shortness of breath: Secondary | ICD-10-CM | POA: Diagnosis not present

## 2024-04-04 DIAGNOSIS — N433 Hydrocele, unspecified: Secondary | ICD-10-CM | POA: Insufficient documentation

## 2024-04-04 DIAGNOSIS — I10 Essential (primary) hypertension: Secondary | ICD-10-CM | POA: Diagnosis not present

## 2024-04-04 DIAGNOSIS — Z794 Long term (current) use of insulin: Secondary | ICD-10-CM | POA: Insufficient documentation

## 2024-04-04 DIAGNOSIS — Z79899 Other long term (current) drug therapy: Secondary | ICD-10-CM | POA: Diagnosis not present

## 2024-04-04 DIAGNOSIS — M7989 Other specified soft tissue disorders: Secondary | ICD-10-CM | POA: Insufficient documentation

## 2024-04-04 DIAGNOSIS — E1122 Type 2 diabetes mellitus with diabetic chronic kidney disease: Secondary | ICD-10-CM | POA: Diagnosis not present

## 2024-04-04 DIAGNOSIS — I255 Ischemic cardiomyopathy: Secondary | ICD-10-CM | POA: Insufficient documentation

## 2024-04-04 LAB — BASIC METABOLIC PANEL WITH GFR
Anion gap: 12 (ref 5–15)
BUN: 40 mg/dL — ABNORMAL HIGH (ref 8–23)
CO2: 26 mmol/L (ref 22–32)
Calcium: 8.3 mg/dL — ABNORMAL LOW (ref 8.9–10.3)
Chloride: 104 mmol/L (ref 98–111)
Creatinine, Ser: 2.29 mg/dL — ABNORMAL HIGH (ref 0.61–1.24)
GFR, Estimated: 27 mL/min — ABNORMAL LOW (ref >60)
Glucose, Bld: 179 mg/dL — ABNORMAL HIGH (ref 70–99)
Potassium: 3.7 mmol/L (ref 3.5–5.1)
Sodium: 142 mmol/L (ref 135–145)

## 2024-04-04 LAB — LIPID PANEL
Cholesterol: 116 mg/dL (ref 0–200)
HDL: 50 mg/dL (ref >40)
LDL Cholesterol: 51 mg/dL (ref 0–99)
Total CHOL/HDL Ratio: 2.3 ratio
Triglycerides: 73 mg/dL (ref <150)
VLDL: 15 mg/dL (ref 0–40)

## 2024-04-04 LAB — BRAIN NATRIURETIC PEPTIDE: B Natriuretic Peptide: 4242.8 pg/mL — ABNORMAL HIGH (ref 0.0–100.0)

## 2024-04-04 MED ORDER — SPIRONOLACTONE 25 MG PO TABS
25.0000 mg | ORAL_TABLET | Freq: Every day | ORAL | 5 refills | Status: DC
Start: 2024-04-04 — End: 2024-04-19

## 2024-04-04 NOTE — Patient Instructions (Signed)
 Medication Changes:  INCREASE TORSEMIDE  TO 40 MG DAILY (2 PILLS DAILY)  Continue FARXIGA    Lab Work:  Go over to the [facility]ALL. Go pass the gift shop and have your blood work completed.  We will only call you if the results are abnormal or if the provider would like to make medication changes.   Follow-Up in: 1 WEEK WITH OUR NURSE PRACTITIONER TINA HACKNEY, FNP AND OUR PHARMACIST, NASON WISE.  At the [facility], you and your health needs are our priority. We have a designated team specialized in the treatment of Heart Failure. This Care Team includes your primary Heart Failure Specialized Cardiologist (physician), Advanced Practice Providers (APPs- Physician Assistants and Nurse Practitioners), and Pharmacist who all work together to provide you with the care you need, when you need it.   You may see any of the following providers on your designated Care Team at your next follow up:  Dr. Jules Oar Dr. Peder Bourdon Dr. Alwin Baars Dr. Judyth Nunnery Shawnee Dellen, FNP Bevely Brush, RPH-CPP  Please be sure to bring in all your medications bottles to every appointment.   Need to Contact Us :  If you have any questions or concerns before your next appointment please send us  a message through [place] or call our office at [phone].    TO LEAVE A MESSAGE FOR THE NURSE SELECT OPTION 2, PLEASE LEAVE A MESSAGE INCLUDING: YOUR NAME DATE OF BIRTH CALL BACK NUMBER REASON FOR CALL**this is important as we prioritize the call backs  YOU WILL RECEIVE A CALL BACK THE SAME DAY AS LONG AS YOU CALL BEFORE 4:00 PM
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
## 2024-04-06 ENCOUNTER — Encounter: Admitting: Cardiology
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
## 2024-04-10 ENCOUNTER — Encounter

## 2024-04-10 ENCOUNTER — Ambulatory Visit (INDEPENDENT_AMBULATORY_CARE_PROVIDER_SITE_OTHER): Admitting: Urology

## 2024-04-10 ENCOUNTER — Ambulatory Visit (INDEPENDENT_AMBULATORY_CARE_PROVIDER_SITE_OTHER): Payer: Medicaid Other

## 2024-04-10 ENCOUNTER — Encounter: Admitting: Family

## 2024-04-10 VITALS — BP 156/83 | HR 102 | Ht 68.0 in | Wt 211.0 lb

## 2024-04-10 DIAGNOSIS — I442 Atrioventricular block, complete: Secondary | ICD-10-CM | POA: Diagnosis not present

## 2024-04-10 DIAGNOSIS — C61 Malignant neoplasm of prostate: Secondary | ICD-10-CM | POA: Diagnosis not present

## 2024-04-10 DIAGNOSIS — N5089 Other specified disorders of the male genital organs: Secondary | ICD-10-CM | POA: Diagnosis not present

## 2024-04-10 LAB — CUP PACEART REMOTE DEVICE CHECK
Battery Remaining Longevity: 52 mo
Battery Remaining Percentage: 59 %
Battery Voltage: 2.98 V
Brady Statistic AP VP Percent: 63 %
Brady Statistic AP VS Percent: 1 %
Brady Statistic AS VP Percent: 35 %
Brady Statistic AS VS Percent: 1 %
Brady Statistic RA Percent Paced: 60 %
Date Time Interrogation Session: 20250603031157
Implantable Lead Connection Status: 753985
Implantable Lead Connection Status: 753985
Implantable Lead Connection Status: 753985
Implantable Lead Implant Date: 20221205
Implantable Lead Implant Date: 20221205
Implantable Lead Implant Date: 20221205
Implantable Lead Location: 753858
Implantable Lead Location: 753859
Implantable Lead Location: 753860
Implantable Lead Model: 3830
Implantable Pulse Generator Implant Date: 20221205
Lead Channel Impedance Value: 390 Ohm
Lead Channel Impedance Value: 410 Ohm
Lead Channel Impedance Value: 490 Ohm
Lead Channel Pacing Threshold Amplitude: 0.5 V
Lead Channel Pacing Threshold Amplitude: 0.625 V
Lead Channel Pacing Threshold Amplitude: 1 V
Lead Channel Pacing Threshold Pulse Width: 0.5 ms
Lead Channel Pacing Threshold Pulse Width: 0.5 ms
Lead Channel Pacing Threshold Pulse Width: 0.5 ms
Lead Channel Sensing Intrinsic Amplitude: 12 mV
Lead Channel Sensing Intrinsic Amplitude: 2.5 mV
Lead Channel Setting Pacing Amplitude: 1.5 V
Lead Channel Setting Pacing Amplitude: 2 V
Lead Channel Setting Pacing Amplitude: 2 V
Lead Channel Setting Pacing Pulse Width: 0.5 ms
Lead Channel Setting Pacing Pulse Width: 0.5 ms
Lead Channel Setting Sensing Sensitivity: 2 mV
Pulse Gen Model: 3222
Pulse Gen Serial Number: 3901949

## 2024-04-10 NOTE — Patient Instructions (Signed)
 Hydrocele, Adult A hydrocele is a collection of fluid in the loose pouch of skin that holds the testicles (scrotum). It can occur in one or both testicles. This may happen because: The amount of fluid produced in the scrotum is not absorbed by the rest of the body. Fluid from the abdomen fills the scrotum. Normally, the testicles develop in the abdomen and then drop into the scrotum before birth. The tube that the testicles travel through usually closes after the testicles drop. If the tube does not close, fluid from the abdomen can fill the scrotum. This is not very common in adults. What are the causes? A hydrocele may be caused by: An injury to the scrotum. An infection. Decreased blood flow to the scrotum.  Sometimes, the cause is not known. What are the signs or symptoms? A hydrocele feels like a water-filled balloon. It may also feel heavy. Other symptoms include: Swelling of the scrotum. The swelling may decrease when you lie down. You may also notice more swelling at night than in the morning. This is called a communicating hydrocele, in which the fluid in the scrotum goes back into the abdominal cavity when the position of the scrotum changes. Swelling of the groin. Mild discomfort in the scrotum. Pain. This can develop if the hydrocele was caused by infection or twisting. The larger the hydrocele, the more likely you are to have pain. Swelling may also cause pain. How is this diagnosed? This condition may be diagnosed based on a physical exam and your medical history. You may also have tests, including: Imaging tests, such as an ultrasound. A transillumination test. This test takes place in a dark room where a light is placed on the skin of the scrotum. Clear liquid will not impede the light and the scrotum will be illuminated. This helps a health care provider distinguish a hydrocele from a tumor. Blood or urine tests. How is this treated? Most hydroceles go away on their own. If  you have no discomfort or pain, your health care provider may suggest close monitoring of your condition until the condition goes away or symptoms develop. This is called watch and wait or watchful waiting. If treatment is needed, it may include: Treating an underlying condition. This may include taking an antibiotic medicine to treat an infection. Having surgery to stop fluid from collecting in the scrotum. Having surgery to drain the fluid. Surgery may include: Hydrocelectomy. For this procedure, an incision is made in the scrotum to remove the fluid. Needle aspiration. A needle is used to drain fluid. However, the fluid buildup will come back quickly and may lead to an infection of the scrotum. This is rarely done. Follow these instructions at home: Medicines Take over-the-counter and prescription medicines only as told by your health care provider. If you were prescribed an antibiotic medicine, take it as told by your health care provider. Do not stop taking the antibiotic even if you start to feel better. General instructions Watch the hydrocele for any changes. Keep all follow-up visits. This is important. Contact a health care provider if: You notice any changes in the hydrocele. The swelling in your scrotum or groin gets worse. The hydrocele becomes red, firm, painful, or tender to the touch. You have a fever. Get help right away if you: Develop a lot of pain or your pain becomes worse. Have chills. Have a high fever. Summary A hydrocele is a collection of fluid in the loose pouch of skin that holds the testicles (scrotum).  A hydrocele can cause swelling, discomfort, and pain. In adults, the cause of a hydrocele may not be known. However, it is sometimes caused by an infection or the twisting of a testicle. Hydroceles often go away on their own. If a hydrocele causes pain, treating the underlying cause may be needed to ease the pain. This information is not intended to replace  advice given to you by your health care provider. Make sure you discuss any questions you have with your health care provider. Document Revised: 06/11/2021 Document Reviewed: 06/11/2021 Elsevier Patient Education  2024 [employer].

## 2024-04-10 NOTE — Progress Notes (Signed)
 04/10/24 2:43 PM   [name] June 10, 1935 [id number]  CC: Left hydrocele, prostate cancer with biochemical recurrence  HPI: Extremely comorbid 89 year old male with medical history notable for CAD, CHF with ejection fraction 25%, type 2 diabetes(hemoglobin A1c 7.5), CKD stage IIIb, history of prostate cancer.  He is followed by Dr. Annabell Key at [facility] urology for recent biochemical recurrence with most PSA of 4.35 and they opted for watchful waiting/surveillance with his numerous comorbidities.    He was hospitalized at [facility] in May 2025 with heart failure exacerbation, UTI, scrotal swelling.  He has a known left hydrocele, as well as diffuse full body edema in the setting of CHF.  He is on high-dose diuretics.  An appointment was made at follow-up with me to discuss his left scrotal swelling.  He is not having any scrotal pain, just the bulky discomfort of the swelling.   PMH: Past Medical History:  Diagnosis Date   AV block, Mobitz 1    CAD (coronary artery disease)    a. 08/2019 VF arrest-->sev 3vd on cath-->CABGx5 (LIMA->LAD, VG->OM->LCX, VG->RPDA, VG->Diag); b. 08/2021 Cath: sev native multivessel dzs w/ 5/5 patent grafts. Nl filling pressures->Med rx; c. 05/2023 Cath: stable anatomy->Med rx.   CKD (chronic kidney disease), stage III (HCC)    Diabetes mellitus without complication (HCC)    HFrEF (heart failure with reduced ejection fraction) (HCC)    a. 08/2020 Echo: EF 40-45%; b. 11/2020 Echo: EF 55%, Gr2 DD; c. 08/2021 Echo: EF 35-40%, glob HK w/ ant/antsept/apical HK; d. 10/2021 s/p CRT-P; e. 12/2021 Echo: EF 30-35%, glob HK, GrIII DD; f. 12/2022 Echo: EF 30-35%, glob HK, sev HK of ant wall, mod asymm LVH, nl RV fxn, mildly dil LA, triv MR, AoV sclerosis.   Hyperlipidemia LDL goal <70    Hypertension    Intermittent Complete heart block (HCC)    a. 08/2021 noted on Zio; b. 10/2021 s/p Abbott Allure RF CRT-P (Ser # [id number]).   Ischemic cardiomyopathy    a. 08/2020 Echo: EF 40-45%; b.  11/2020 Echo: EF 55%; c. 08/2021 Echo: EF 35-40%; d. 10/2021 s/p CRT-P; e. 12/2021 Echo: EF 30-35%, glob HK, GrIII DD.   PAF (paroxysmal atrial fibrillation) (HCC)    a.Medical device check-->Eliquis  (CHA2DS2VASc = 6)   Trifascicular block     Surgical History: Past Surgical History:  Procedure Laterality Date   BACK SURGERY     CARDIAC CATHETERIZATION     CLIPPING OF ATRIAL APPENDAGE N/A 08/29/2020   Procedure: CLIPPING OF ATRIAL APPENDAGE USING ATRICURE 45 MM ATRICLIP FLEX-V;  Surgeon: Norita Beauvais, MD;  Location: [facility] OR;  Service: Open Heart Surgery;  Laterality: N/A;   CORONARY ARTERY BYPASS GRAFT N/A 08/29/2020   Procedure: CORONARY ARTERY BYPASS GRAFTING (CABG) X 5 USING LEFT INTERNAL MAMMARY ARTERY AND ENDOSCOPICALLY HARVESTED RIGHT GREATER SAPHENOUS VEIN. LIMA TO LAD, SVG TO OM SEQ TO CIRC, SVG TO PD, SVG TO DIAG.;  Surgeon: Norita Beauvais, MD;  Location: [facility] OR;  Service: Open Heart Surgery;  Laterality: N/A;   ENDOVEIN HARVEST OF GREATER SAPHENOUS VEIN Right 08/29/2020   Procedure: ENDOVEIN HARVEST OF GREATER SAPHENOUS VEIN;  Surgeon: Norita Beauvais, MD;  Location: [facility] OR;  Service: Open Heart Surgery;  Laterality: Right;   HERNIA REPAIR     LEFT HEART CATH AND CORONARY ANGIOGRAPHY N/A 08/25/2020   Procedure: LEFT HEART CATH AND CORONARY ANGIOGRAPHY;  Surgeon: Wenona Hamilton, MD;  Location: [facility] INVASIVE CV LAB;  Service: Cardiovascular;  Laterality: N/A;   PACEMAKER  IMPLANT N/A 10/12/2021   Procedure: PACEMAKER IMPLANT;  Surgeon: Boyce Byes, MD;  Location: [facility] INVASIVE CV LAB;  Service: Cardiovascular;  Laterality: N/A;   RIGHT/LEFT HEART CATH AND CORONARY ANGIOGRAPHY N/A 08/31/2021   Procedure: RIGHT/LEFT HEART CATH AND CORONARY ANGIOGRAPHY;  Surgeon: Wenona Hamilton, MD;  Location: [facility] INVASIVE CV LAB;  Service: Cardiovascular;  Laterality: N/A;   RIGHT/LEFT HEART CATH AND CORONARY/GRAFT ANGIOGRAPHY N/A 06/06/2023   Procedure: RIGHT/LEFT HEART CATH AND  CORONARY/GRAFT ANGIOGRAPHY;  Surgeon: Wenona Hamilton, MD;  Location: [facility] INVASIVE CV LAB;  Service: Cardiovascular;  Laterality: N/A;   TEE WITHOUT CARDIOVERSION N/A 08/29/2020   Procedure: TRANSESOPHAGEAL ECHOCARDIOGRAM (TEE);  Surgeon: Norita Beauvais, MD;  Location: [facility] OR;  Service: Open Heart Surgery;  Laterality: N/A;    Family History: Family History  Problem Relation Age of Onset   Heart attack Mother     Social History:  reports that he quit smoking about 27 years ago. His smoking use included cigarettes. He has never used smokeless tobacco. He reports that he does not currently use alcohol. He reports that he does not use drugs.  Physical Exam: BP (!) 156/83   Pulse (!) 102   Ht 5\' 8"  (1.727 m)   Wt 211 lb (95.7 kg)   BMI 32.08 kg/m    Constitutional: Frail-appearing, elderly Cardiovascular: No clubbing, cyanosis, or edema. Respiratory: Normal respiratory effort, no increased work of breathing. GI: Abdomen is soft, nontender, nondistended, no abdominal masses GU: Diffuse penile and scrotal edema, significant left scrotal swelling, nontender  Laboratory Data: Reviewed  Pertinent Imaging: I have personally viewed and interpreted the prior scrotal ultrasound showing a left hydrocele and diffuse left scrotal edema.  Assessment & Plan:   Extremely comorbid 89 year old male with extensive history of vascular disease, CHF with ejection fraction of 25%, diabetes, CKD, prostate cancer with biochemical recurrence on watchful waiting, regularly followed by [facility] urology, and referred to me for consideration of left scrotal swelling after recent hospitalization.  I had a very long conversation with the patient and his daughter today about his numerous comorbidities, and that his swelling is primarily diffuse scrotal and penile edema from fluid overload and CHF, as opposed to true hydrocele.  We discussed options including observation, clinic aspiration of hydrocele, or  hydrocelectomy.  Unfortunately, I think he would get very little benefit from drainage or hydrocelectomy, as again, I think his primary issue is the CHF and diffuse edema in the groin and penis.  Also, he would be extremely high risk for procedure in terms of his cardiac history, diabetes, and need to stop anticoagulation.  They are in agreement for more of a watchful waiting type approach which I think is very appropriate with his other medical issues.  They will keep their follow-up with [facility] urology for his biochemical recurrence of prostate cancer on watchful waiting, and they can discuss with [facility] if they feel hydrocele aspiration or hydrocelectomy is reasonable, however feel strongly the risks significantly outweigh any benefit.  Keep follow-up with [facility] urology for biochemical recurrence of prostate cancer on watchful waiting Follow-up with [facility] urology as needed  I spent 60 total minutes on the day of the encounter including pre-visit review of the medical record, face-to-face time with the patient, and post visit ordering of labs/imaging/tests.  Extensive review of outside medical records and imaging at [facility], recent hospitalization notes.  Jay Meth, MD 04/10/2024  [facility] [address] [place], [place] [address] [phone]

## 2024-04-14 ENCOUNTER — Ambulatory Visit: Payer: Self-pay | Admitting: Cardiology

## 2024-04-18 ENCOUNTER — Telehealth: Payer: Self-pay | Admitting: Cardiology

## 2024-04-18 NOTE — Telephone Encounter (Signed)
 Called to confirm/remind patient of their appointment at the [facility] on 04/19/24.   Appointment:   [] Confirmed  [x] Left mess   [] No answer/No voice mail  [] VM Full/unable to leave message  [] Phone not in service  Patient reminded to bring all medications and/or complete list.  Confirmed patient has transportation. Gave directions, instructed to utilize valet parking.

## 2024-04-19 ENCOUNTER — Other Ambulatory Visit
Admission: RE | Admit: 2024-04-19 | Discharge: 2024-04-19 | Disposition: A | Discharge status: Home / Self Care | Source: Ambulatory Visit | Attending: Family | Admitting: Family

## 2024-04-19 ENCOUNTER — Ambulatory Visit: Admitting: Family

## 2024-04-19 ENCOUNTER — Ambulatory Visit: Payer: Self-pay | Admitting: Family

## 2024-04-19 ENCOUNTER — Encounter: Payer: Self-pay | Admitting: Family

## 2024-04-19 VITALS — BP 145/81 | HR 62 | Wt 211.0 lb

## 2024-04-19 DIAGNOSIS — I5022 Chronic systolic (congestive) heart failure: Secondary | ICD-10-CM | POA: Insufficient documentation

## 2024-04-19 DIAGNOSIS — N183 Chronic kidney disease, stage 3 unspecified: Secondary | ICD-10-CM

## 2024-04-19 DIAGNOSIS — I480 Paroxysmal atrial fibrillation: Secondary | ICD-10-CM

## 2024-04-19 DIAGNOSIS — I2510 Atherosclerotic heart disease of native coronary artery without angina pectoris: Secondary | ICD-10-CM

## 2024-04-19 LAB — BASIC METABOLIC PANEL WITH GFR
Anion gap: 10 (ref 5–15)
BUN: 51 mg/dL — ABNORMAL HIGH (ref 8–23)
CO2: 26 mmol/L (ref 22–32)
Calcium: 8.1 mg/dL — ABNORMAL LOW (ref 8.9–10.3)
Chloride: 106 mmol/L (ref 98–111)
Creatinine, Ser: 1.97 mg/dL — ABNORMAL HIGH (ref 0.61–1.24)
GFR, Estimated: 32 mL/min — ABNORMAL LOW (ref >60)
Glucose, Bld: 147 mg/dL — ABNORMAL HIGH (ref 70–99)
Potassium: 3.4 mmol/L — ABNORMAL LOW (ref 3.5–5.1)
Sodium: 142 mmol/L (ref 135–145)

## 2024-04-19 MED ORDER — SPIRONOLACTONE 25 MG PO TABS: 25.0000 mg | ORAL_TABLET | Freq: Every day | ORAL | 5 refills | Status: DC

## 2024-04-19 MED ORDER — METOLAZONE 2.5 MG PO TABS: 2.5000 mg | ORAL_TABLET | ORAL | 1 refills | Status: DC
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
## 2024-04-19 NOTE — Patient Instructions (Signed)
 Lab Work:  Go over to the MEDICAL MALL. Go pass the gift shop and have your blood work completed.  We will only call you if the results are abnormal or if the provider would like to make medication changes.    Follow-Up in: 3-4 weeks with Dr. Mitzie Anda.  At the [facility], you and your health needs are our priority. We have a designated team specialized in the treatment of Heart Failure. This Care Team includes your primary Heart Failure Specialized Cardiologist (physician), Advanced Practice Providers (APPs- Physician Assistants and Nurse Practitioners), and Pharmacist who all work together to provide you with the care you need, when you need it.   You may see any of the following providers on your designated Care Team at your next follow up:  Dr. Jules Oar Dr. Peder Bourdon Dr. Alwin Baars Dr. Judyth Nunnery Shawnee Dellen, FNP Bevely Brush, RPH-CPP  Please be sure to bring in all your medications bottles to every appointment.   Need to Contact Us :  If you have any questions or concerns before your next appointment please send us  a message through [place] or call our office at [phone].    TO LEAVE A MESSAGE FOR THE NURSE SELECT OPTION 2, PLEASE LEAVE A MESSAGE INCLUDING: YOUR NAME DATE OF BIRTH CALL BACK NUMBER REASON FOR CALL**this is important as we prioritize the call backs  YOU WILL RECEIVE A CALL BACK THE SAME DAY AS LONG AS YOU CALL BEFORE 4:00 PM
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
## 2024-04-19 NOTE — Progress Notes (Signed)
 Medications were reviewed with the patient. Pill organizer was completed aside from spironolactone , which the patient will pick up today.

## 2024-04-20 MED ORDER — TORSEMIDE 20 MG PO TABS: 60.0000 mg | ORAL_TABLET | Freq: Every day | ORAL | 5 refills | Status: DC

## 2024-04-20 MED ORDER — POTASSIUM CHLORIDE CRYS ER 20 MEQ PO TBCR: 40.0000 meq | EXTENDED_RELEASE_TABLET | Freq: Every day | ORAL | 5 refills | Status: DC

## 2024-05-09 ENCOUNTER — Emergency Department

## 2024-05-09 ENCOUNTER — Other Ambulatory Visit: Payer: Self-pay

## 2024-05-09 ENCOUNTER — Inpatient Hospital Stay
Admission: EM | Admit: 2024-05-09 | Discharge: 2024-05-13 | DRG: 291 | Disposition: A | Discharge status: Home Health | Attending: Internal Medicine | Admitting: Internal Medicine

## 2024-05-09 DIAGNOSIS — E669 Obesity, unspecified: Secondary | ICD-10-CM | POA: Diagnosis present

## 2024-05-09 DIAGNOSIS — N433 Hydrocele, unspecified: Secondary | ICD-10-CM | POA: Diagnosis present

## 2024-05-09 DIAGNOSIS — I10 Essential (primary) hypertension: Secondary | ICD-10-CM | POA: Diagnosis present

## 2024-05-09 DIAGNOSIS — N1832 Chronic kidney disease, stage 3b: Secondary | ICD-10-CM | POA: Diagnosis present

## 2024-05-09 DIAGNOSIS — Z7901 Long term (current) use of anticoagulants: Secondary | ICD-10-CM

## 2024-05-09 DIAGNOSIS — Z8674 Personal history of sudden cardiac arrest: Secondary | ICD-10-CM

## 2024-05-09 DIAGNOSIS — I255 Ischemic cardiomyopathy: Secondary | ICD-10-CM | POA: Diagnosis present

## 2024-05-09 DIAGNOSIS — I5043 Acute on chronic combined systolic (congestive) and diastolic (congestive) heart failure: Secondary | ICD-10-CM | POA: Diagnosis present

## 2024-05-09 DIAGNOSIS — E1169 Type 2 diabetes mellitus with other specified complication: Secondary | ICD-10-CM | POA: Diagnosis present

## 2024-05-09 DIAGNOSIS — Z951 Presence of aortocoronary bypass graft: Secondary | ICD-10-CM

## 2024-05-09 DIAGNOSIS — I130 Hypertensive heart and chronic kidney disease with heart failure and stage 1 through stage 4 chronic kidney disease, or unspecified chronic kidney disease: Secondary | ICD-10-CM | POA: Diagnosis not present

## 2024-05-09 DIAGNOSIS — I509 Heart failure, unspecified: Secondary | ICD-10-CM

## 2024-05-09 DIAGNOSIS — R079 Chest pain, unspecified: Secondary | ICD-10-CM

## 2024-05-09 DIAGNOSIS — Z955 Presence of coronary angioplasty implant and graft: Secondary | ICD-10-CM

## 2024-05-09 DIAGNOSIS — I861 Scrotal varices: Secondary | ICD-10-CM | POA: Diagnosis present

## 2024-05-09 DIAGNOSIS — I472 Ventricular tachycardia, unspecified: Secondary | ICD-10-CM | POA: Diagnosis present

## 2024-05-09 DIAGNOSIS — N179 Acute kidney failure, unspecified: Secondary | ICD-10-CM | POA: Diagnosis present

## 2024-05-09 DIAGNOSIS — R0602 Shortness of breath: Secondary | ICD-10-CM | POA: Diagnosis not present

## 2024-05-09 DIAGNOSIS — R7989 Other specified abnormal findings of blood chemistry: Secondary | ICD-10-CM | POA: Diagnosis present

## 2024-05-09 DIAGNOSIS — Z888 Allergy status to other drugs, medicaments and biological substances status: Secondary | ICD-10-CM

## 2024-05-09 DIAGNOSIS — I5023 Acute on chronic systolic (congestive) heart failure: Secondary | ICD-10-CM | POA: Diagnosis present

## 2024-05-09 DIAGNOSIS — Z7982 Long term (current) use of aspirin: Secondary | ICD-10-CM

## 2024-05-09 DIAGNOSIS — N184 Chronic kidney disease, stage 4 (severe): Secondary | ICD-10-CM | POA: Diagnosis present

## 2024-05-09 DIAGNOSIS — Z6831 Body mass index (BMI) 31.0-31.9, adult: Secondary | ICD-10-CM

## 2024-05-09 DIAGNOSIS — Z8249 Family history of ischemic heart disease and other diseases of the circulatory system: Secondary | ICD-10-CM

## 2024-05-09 DIAGNOSIS — Z794 Long term (current) use of insulin: Secondary | ICD-10-CM

## 2024-05-09 DIAGNOSIS — I2510 Atherosclerotic heart disease of native coronary artery without angina pectoris: Secondary | ICD-10-CM | POA: Diagnosis present

## 2024-05-09 DIAGNOSIS — Z87891 Personal history of nicotine dependence: Secondary | ICD-10-CM

## 2024-05-09 DIAGNOSIS — E876 Hypokalemia: Secondary | ICD-10-CM | POA: Diagnosis present

## 2024-05-09 DIAGNOSIS — E1122 Type 2 diabetes mellitus with diabetic chronic kidney disease: Secondary | ICD-10-CM | POA: Diagnosis present

## 2024-05-09 DIAGNOSIS — I480 Paroxysmal atrial fibrillation: Secondary | ICD-10-CM | POA: Diagnosis present

## 2024-05-09 DIAGNOSIS — Z79899 Other long term (current) drug therapy: Secondary | ICD-10-CM

## 2024-05-09 DIAGNOSIS — D696 Thrombocytopenia, unspecified: Secondary | ICD-10-CM | POA: Diagnosis present

## 2024-05-09 DIAGNOSIS — Z950 Presence of cardiac pacemaker: Secondary | ICD-10-CM

## 2024-05-09 DIAGNOSIS — Z7984 Long term (current) use of oral hypoglycemic drugs: Secondary | ICD-10-CM

## 2024-05-09 DIAGNOSIS — E785 Hyperlipidemia, unspecified: Secondary | ICD-10-CM | POA: Diagnosis present

## 2024-05-09 LAB — COMPREHENSIVE METABOLIC PANEL WITH GFR
ALT: 13 U/L (ref 0–44)
AST: 21 U/L (ref 15–41)
Albumin: 3.8 g/dL (ref 3.5–5.0)
Alkaline Phosphatase: 72 U/L (ref 38–126)
Anion gap: 16 — ABNORMAL HIGH (ref 5–15)
BUN: 46 mg/dL — ABNORMAL HIGH (ref 8–23)
CO2: 22 mmol/L (ref 22–32)
Calcium: 8.4 mg/dL — ABNORMAL LOW (ref 8.9–10.3)
Chloride: 106 mmol/L (ref 98–111)
Creatinine, Ser: 2.38 mg/dL — ABNORMAL HIGH (ref 0.61–1.24)
GFR, Estimated: 26 mL/min — ABNORMAL LOW (ref >60)
Glucose, Bld: 144 mg/dL — ABNORMAL HIGH (ref 70–99)
Potassium: 3.6 mmol/L (ref 3.5–5.1)
Sodium: 144 mmol/L (ref 135–145)
Total Bilirubin: 1.1 mg/dL (ref 0.0–1.2)
Total Protein: 7.9 g/dL (ref 6.5–8.1)

## 2024-05-09 LAB — CBC WITH DIFFERENTIAL/PLATELET
Abs Immature Granulocytes: 0.01 10*3/uL (ref 0.00–0.07)
Basophils Absolute: 0.1 10*3/uL (ref 0.0–0.1)
Basophils Relative: 1 %
Eosinophils Absolute: 0.3 10*3/uL (ref 0.0–0.5)
Eosinophils Relative: 6 %
HCT: 44.8 % (ref 39.0–52.0)
Hemoglobin: 13.7 g/dL (ref 13.0–17.0)
Immature Granulocytes: 0 %
Lymphocytes Relative: 29 %
Lymphs Abs: 1.3 10*3/uL (ref 0.7–4.0)
MCH: 28.5 pg (ref 26.0–34.0)
MCHC: 30.6 g/dL (ref 30.0–36.0)
MCV: 93.1 fL (ref 80.0–100.0)
Monocytes Absolute: 0.5 10*3/uL (ref 0.1–1.0)
Monocytes Relative: 11 %
Neutro Abs: 2.5 10*3/uL (ref 1.7–7.7)
Neutrophils Relative %: 53 %
Platelets: 114 10*3/uL — ABNORMAL LOW (ref 150–400)
RBC: 4.81 MIL/uL (ref 4.22–5.81)
RDW: 15.5 % (ref 11.5–15.5)
WBC: 4.6 10*3/uL (ref 4.0–10.5)
nRBC: 0 % (ref 0.0–0.2)

## 2024-05-09 LAB — BRAIN NATRIURETIC PEPTIDE: B Natriuretic Peptide: >4500 pg/mL — ABNORMAL HIGH (ref 0.0–100.0)

## 2024-05-09 LAB — TROPONIN I (HIGH SENSITIVITY): Troponin I (High Sensitivity): 29 ng/L — ABNORMAL HIGH (ref <18)

## 2024-05-09 MED ORDER — ACETAMINOPHEN 325 MG PO TABS
650.0000 mg | ORAL_TABLET | Freq: Four times a day (QID) | ORAL | Status: DC | PRN
Start: 2024-05-09 — End: 2024-05-13

## 2024-05-09 MED ORDER — ASPIRIN 81 MG PO TBEC
81.0000 mg | DELAYED_RELEASE_TABLET | Freq: Every day | ORAL | Status: DC
  Administered 2024-05-10 – 2024-05-12 (×3): 81 mg via ORAL
  Filled 2024-05-09 (×3): qty 1

## 2024-05-09 MED ORDER — ALBUTEROL SULFATE (2.5 MG/3ML) 0.083% IN NEBU: 2.5000 mg | INHALATION_SOLUTION | RESPIRATORY_TRACT | Status: DC | PRN

## 2024-05-09 MED ORDER — TAMSULOSIN HCL 0.4 MG PO CAPS
0.4000 mg | ORAL_CAPSULE | Freq: Every day | ORAL | Status: DC
  Administered 2024-05-10 – 2024-05-13 (×4): 0.4 mg via ORAL
  Filled 2024-05-09 (×4): qty 1

## 2024-05-09 MED ORDER — POTASSIUM CHLORIDE CRYS ER 20 MEQ PO TBCR
40.0000 meq | EXTENDED_RELEASE_TABLET | Freq: Every day | ORAL | Status: DC
  Administered 2024-05-10: 40 meq via ORAL
  Filled 2024-05-09: qty 2

## 2024-05-09 MED ORDER — ACETAMINOPHEN 650 MG RE SUPP: 650.0000 mg | Freq: Four times a day (QID) | RECTAL | Status: DC | PRN

## 2024-05-09 MED ORDER — ONDANSETRON HCL 4 MG PO TABS
4.0000 mg | ORAL_TABLET | Freq: Four times a day (QID) | ORAL | Status: DC | PRN
Start: 2024-05-09 — End: 2024-05-13

## 2024-05-09 MED ORDER — DAPAGLIFLOZIN PROPANEDIOL 10 MG PO TABS
10.0000 mg | ORAL_TABLET | Freq: Every day | ORAL | Status: DC
  Administered 2024-05-10 – 2024-05-13 (×4): 10 mg via ORAL
  Filled 2024-05-09 (×4): qty 1

## 2024-05-09 MED ORDER — ROSUVASTATIN CALCIUM 10 MG PO TABS
40.0000 mg | ORAL_TABLET | Freq: Every day | ORAL | Status: DC
  Administered 2024-05-10 – 2024-05-13 (×4): 40 mg via ORAL
  Filled 2024-05-09 (×3): qty 4

## 2024-05-09 MED ORDER — HYDRALAZINE HCL 25 MG PO TABS
25.0000 mg | ORAL_TABLET | Freq: Two times a day (BID) | ORAL | Status: DC
  Administered 2024-05-10 – 2024-05-13 (×6): 25 mg via ORAL
  Filled 2024-05-09 (×7): qty 1

## 2024-05-09 MED ORDER — SPIRONOLACTONE 25 MG PO TABS
25.0000 mg | ORAL_TABLET | Freq: Every day | ORAL | Status: DC
  Administered 2024-05-10 – 2024-05-13 (×4): 25 mg via ORAL
  Filled 2024-05-09 (×4): qty 1

## 2024-05-09 MED ORDER — METOLAZONE 2.5 MG PO TABS: 2.5000 mg | ORAL_TABLET | ORAL | Status: DC

## 2024-05-09 MED ORDER — ONDANSETRON HCL 4 MG/2ML IJ SOLN: 4.0000 mg | Freq: Four times a day (QID) | INTRAMUSCULAR | Status: DC | PRN

## 2024-05-09 MED ORDER — FUROSEMIDE 10 MG/ML IJ SOLN
40.0000 mg | Freq: Two times a day (BID) | INTRAMUSCULAR | Status: DC
  Administered 2024-05-10: 40 mg via INTRAVENOUS
  Filled 2024-05-09: qty 4

## 2024-05-09 MED ORDER — IPRATROPIUM-ALBUTEROL 0.5-2.5 (3) MG/3ML IN SOLN
3.0000 mL | Freq: Once | RESPIRATORY_TRACT | Status: AC
  Filled 2024-05-09: qty 3

## 2024-05-09 MED ORDER — INSULIN GLARGINE-YFGN 100 UNIT/ML ~~LOC~~ SOLN
15.0000 [IU] | Freq: Every day | SUBCUTANEOUS | Status: DC
  Administered 2024-05-10 (×2): 15 [IU] via SUBCUTANEOUS
  Filled 2024-05-09 (×3): qty 0.15

## 2024-05-09 MED ORDER — INSULIN ASPART 100 UNIT/ML IJ SOLN
0.0000 [IU] | Freq: Three times a day (TID) | INTRAMUSCULAR | Status: DC
  Administered 2024-05-11 (×2): 3 [IU] via SUBCUTANEOUS
  Administered 2024-05-12: 2 [IU] via SUBCUTANEOUS
  Administered 2024-05-12: 3 [IU] via SUBCUTANEOUS
  Administered 2024-05-13: 2 [IU] via SUBCUTANEOUS
  Filled 2024-05-09 (×5): qty 1

## 2024-05-09 MED ORDER — ISOSORBIDE MONONITRATE ER 60 MG PO TB24
60.0000 mg | ORAL_TABLET | Freq: Every day | ORAL | Status: DC
  Administered 2024-05-10 – 2024-05-13 (×4): 60 mg via ORAL
  Filled 2024-05-09 (×4): qty 1

## 2024-05-09 MED ORDER — IPRATROPIUM-ALBUTEROL 0.5-2.5 (3) MG/3ML IN SOLN
RESPIRATORY_TRACT | Status: AC
  Administered 2024-05-09: 3 mL via RESPIRATORY_TRACT
  Filled 2024-05-09: qty 3

## 2024-05-09 MED ORDER — INSULIN ASPART 100 UNIT/ML IJ SOLN: 0.0000 [IU] | Freq: Every day | INTRAMUSCULAR | Status: DC

## 2024-05-09 MED ORDER — SACUBITRIL-VALSARTAN 49-51 MG PO TABS
1.0000 | ORAL_TABLET | Freq: Two times a day (BID) | ORAL | Status: DC
  Administered 2024-05-10 – 2024-05-13 (×7): 1 via ORAL
  Filled 2024-05-09 (×8): qty 1

## 2024-05-09 MED ORDER — FUROSEMIDE 10 MG/ML IJ SOLN
60.0000 mg | Freq: Once | INTRAMUSCULAR | Status: AC
  Administered 2024-05-09: 60 mg via INTRAVENOUS
  Filled 2024-05-09: qty 8

## 2024-05-09 MED ORDER — CARVEDILOL 6.25 MG PO TABS
6.2500 mg | ORAL_TABLET | Freq: Two times a day (BID) | ORAL | Status: DC
  Administered 2024-05-10 – 2024-05-13 (×6): 6.25 mg via ORAL
  Filled 2024-05-09 (×6): qty 1

## 2024-05-09 MED ORDER — HYDROCODONE-ACETAMINOPHEN 5-325 MG PO TABS
1.0000 | ORAL_TABLET | ORAL | Status: DC | PRN
  Administered 2024-05-10: 2 via ORAL
  Filled 2024-05-09: qty 2

## 2024-05-09 MED ORDER — EZETIMIBE 10 MG PO TABS
10.0000 mg | ORAL_TABLET | Freq: Every day | ORAL | Status: DC
  Administered 2024-05-10 – 2024-05-13 (×4): 10 mg via ORAL
  Filled 2024-05-09 (×4): qty 1

## 2024-05-09 NOTE — ED Triage Notes (Signed)
 Arrives POV with Granddaughter.   Says he has epigastric pains/ chest pressure x 2 days that are getting worse. Shob worse with exertion. Says he is compliant on Lasix .   Says he has 8 previous heart attacks, CHF, and a pacemaker present.

## 2024-05-09 NOTE — H&P (Signed)
 History and Physical    Patient: [name] FMW:[id number] DOB: October 20, 1935 DOA: 05/09/2024 DOS: the patient was seen and examined on 05/09/2024 PCP: [name], MD  Patient coming from: Home  Chief Complaint:  Chief Complaint  Patient presents with   Chest Pain    HPI: [name] is a 89 y.o. 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, CKD stage IIIb with last hospitalization 5/17 to 03/30/2024 for CHF exacerbation, being admitted with a CHF exacerbation after presenting with a 2-day history of anterior chest pressure, shortness of breath on exertion and increasing leg swelling.  He is compliant with his medication.  He denies nausea vomiting or diaphoresis.  Denies cough fever or chills.  Denies pain in the legs. In the ED he was tachypneic to 30 on arrival but satting in the high 90s on room air.  Initial BP 148/116, trending down to 135/91 at the time of admission.Labs notable for troponin of 29 and BNP greater than 4500, slightly increased from 1 month ago.  Creatinine 2.38, up from recent baseline of 1.7.  CBC significant for mild thrombocytopenia of 114,000, and without leukocytosis. EKG showed a paced rhythm at 77.  Chest x-ray was nonacute and showed stable cardiomegaly.  Patient was treated with a dose of IV Lasix  60 mg and given DuoNeb treatment.  Admission requested.     Review of Systems: As mentioned in the history of present illness. All other systems reviewed and are negative.  Past Medical History:  Diagnosis Date   AV block, [place] 1    CAD (coronary artery disease)    a. 08/2019 VF arrest-->sev 3vd on cath-->CABGx5 (LIMA->LAD, VG->OM->LCX, VG->RPDA, VG->Diag); b. 08/2021 Cath: sev native multivessel dzs w/ 5/5 patent grafts. Nl filling pressures->Med rx; c. 05/2023 Cath: stable anatomy->Med rx.   CKD (chronic  kidney disease), stage III (HCC)    Diabetes mellitus without complication (HCC)    HFrEF (heart failure with reduced ejection fraction) (HCC)    a. 08/2020 Echo: EF 40-45%; b. 11/2020 Echo: EF 55%, Gr2 DD; c. 08/2021 Echo: EF 35-40%, glob HK w/ ant/antsept/apical HK; d. 10/2021 s/p CRT-P; e. 12/2021 Echo: EF 30-35%, glob HK, GrIII DD; f. 12/2022 Echo: EF 30-35%, glob HK, sev HK of ant wall, mod asymm LVH, nl RV fxn, mildly dil LA, triv MR, AoV sclerosis.   Hyperlipidemia LDL goal <70    Hypertension    Intermittent Complete heart block (HCC)    a. 08/2021 noted on Zio; b. 10/2021 s/p Abbott Allure RF CRT-P (Ser # [id number]).   Ischemic cardiomyopathy    a. 08/2020 Echo: EF 40-45%; b. 11/2020 Echo: EF 55%; c. 08/2021 Echo: EF 35-40%; d. 10/2021 s/p CRT-P; e. 12/2021 Echo: EF 30-35%, glob HK, GrIII DD.   PAF (paroxysmal atrial fibrillation) (HCC)    a.Medical device check-->Eliquis  (CHA2DS2VASc = 6)   Trifascicular block    Past Surgical History:  Procedure Laterality Date   BACK SURGERY     CARDIAC CATHETERIZATION     CLIPPING OF ATRIAL APPENDAGE N/A 08/29/2020   Procedure: CLIPPING OF ATRIAL APPENDAGE USING ATRICURE 45 MM ATRICLIP FLEX-V;  Surgeon: Army Dallas NOVAK, MD;  Location: [facility] OR;  Service: Open Heart Surgery;  Laterality: N/A;   CORONARY ARTERY BYPASS GRAFT N/A 08/29/2020   Procedure: CORONARY ARTERY BYPASS GRAFTING (CABG) X 5 USING LEFT INTERNAL MAMMARY ARTERY AND ENDOSCOPICALLY HARVESTED RIGHT GREATER  SAPHENOUS VEIN. LIMA TO LAD, SVG TO OM SEQ TO CIRC, SVG TO PD, SVG TO DIAG.;  Surgeon: Army Dallas NOVAK, MD;  Location: [facility] OR;  Service: Open Heart Surgery;  Laterality: N/A;   ENDOVEIN HARVEST OF GREATER SAPHENOUS VEIN Right 08/29/2020   Procedure: ENDOVEIN HARVEST OF GREATER SAPHENOUS VEIN;  Surgeon: Army Dallas NOVAK, MD;  Location[facility];  Service: Open Heart Surgery;  Laterality: Right;   HERNIA REPAIR     LEFT HEART CATH AND CORONARY ANGIOGRAPHY N/A 08/25/2020   Procedure: LEFT  HEART CATH AND CORONARY ANGIOGRAPHY;  Surgeon: Darron Deatrice LABOR, MD;  Location: [facility] INVASIVE CV LAB;  Service: Cardiovascular;  Laterality: N/A;   PACEMAKER IMPLANT N/A 10/12/2021   Procedure: PACEMAKER IMPLANT;  Surgeon: Cindie Ole DASEN, MD;  Location: [facility] INVASIVE CV LAB;  Service: Cardiovascular;  Laterality: N/A;   RIGHT/LEFT HEART CATH AND CORONARY ANGIOGRAPHY N/A 08/31/2021   Procedure: RIGHT/LEFT HEART CATH AND CORONARY ANGIOGRAPHY;  Surgeon: Darron Deatrice LABOR, MD;  Location: [facility] INVASIVE CV LAB;  Service: Cardiovascular;  Laterality: N/A;   RIGHT/LEFT HEART CATH AND CORONARY/GRAFT ANGIOGRAPHY N/A 06/06/2023   Procedure: RIGHT/LEFT HEART CATH AND CORONARY/GRAFT ANGIOGRAPHY;  Surgeon: Darron Deatrice LABOR, MD;  Location: [facility] INVASIVE CV LAB;  Service: Cardiovascular;  Laterality: N/A;   TEE WITHOUT CARDIOVERSION N/A 08/29/2020   Procedure: TRANSESOPHAGEAL ECHOCARDIOGRAM (TEE);  Surgeon: Army Dallas NOVAK, MD;  Location: [facility] OR;  Service: Open Heart Surgery;  Laterality: N/A;   Social History:  reports that he quit smoking about 27 years ago. His smoking use included cigarettes. He has never used smokeless tobacco. He reports that he does not currently use alcohol. He reports that he does not use drugs.  Allergies  Allergen Reactions   Angiotensin Receptor Blockers     hyperkalemia   Spironolactone      Hyperkalemia Pt states he is not allergic   Metformin Diarrhea    Family History  Problem Relation Age of Onset   Heart attack Mother     Prior to Admission medications   Medication Sig Start Date End Date Taking? Authorizing Provider  acetaminophen  (TYLENOL ) 500 MG tablet Take 1,000 mg by mouth every 6 (six) hours as needed for mild pain, fever or headache.    [provider]  acyclovir  (ZOVIRAX ) 800 MG tablet Take 800 mg by mouth daily.    [provider]  apixaban  (ELIQUIS ) 2.5 MG TABS tablet Take 1 tablet (2.5 mg total) by mouth 2 (two) times daily. 02/08/23    Terra Fairy PARAS, PA-C  aspirin  EC 81 MG tablet Take 81 mg by mouth daily. Swallow whole.    [provider]  carvedilol  (COREG ) 6.25 MG tablet TAKE 1 TABLET BY MOUTH 2 TIMES DAILY WITH A MEAL. 02/21/24   Zenaida Morene PARAS, MD  dapagliflozin  propanediol (FARXIGA ) 10 MG TABS tablet Take 1 tablet (10 mg total) by mouth daily. 12/27/23   Awanda City, MD  Evolocumab (REPATHA) 140 MG/ML SOSY Inject into the skin.    [provider]  ezetimibe  (ZETIA ) 10 MG tablet TAKE 1 TABLET BY MOUTH EVERY DAY 12/23/23   McLean, Dalton S, MD  hydrALAZINE  (APRESOLINE ) 25 MG tablet Take 25 mg by mouth 2 (two) times daily. 04/02/24   [provider]  Insulin  Degludec-Liraglutide (XULTOPHY) 100-3.6 UNIT-MG/ML SOPN Inject 15 Units into the skin.    [provider]  Insulin  Glargine (BASAGLAR  KWIKPEN) 100 UNIT/ML Inject 15 Units into the skin at bedtime. 02/19/24 02/18/25  [provider]  isosorbide   mononitrate (IMDUR ) 60 MG 24 hr tablet Take 60 mg by mouth daily. 04/02/24   [provider]  magnesium  oxide (MAG-OX) 400 MG tablet Take 1 tablet (400 mg total) by mouth daily. 01/20/22   Cindie Ole DASEN, MD  metolazone  (ZAROXOLYN ) 2.5 MG tablet Take 1 tablet (2.5 mg total) by mouth every 7 (seven) days. (Tuesday) 04/19/24 04/19/25  Donette Ellouise LABOR, FNP  pantoprazole  (PROTONIX ) 40 MG tablet Take 40 mg by mouth daily.    [provider]  polyethylene glycol (MIRALAX  / GLYCOLAX ) 17 g packet Take 17 g by mouth daily as needed for moderate constipation. 12/19/22   Josette Ade, MD  potassium chloride  SA (KLOR-CON  M) 20 MEQ tablet Take 2 tablets (40 mEq total) by mouth daily. 04/20/24   Donette Ellouise LABOR, FNP  REPATHA SURECLICK 140 MG/ML SOAJ Inject 140 mg into the skin.    [provider]  rosuvastatin  (CRESTOR ) 40 MG tablet Take 1 tablet (40 mg total) by mouth daily. 05/25/23   Gerard Frederick, NP  sacubitril -valsartan  (ENTRESTO ) 49-51 MG Take 1 tablet by mouth 2 (two)  times daily. 01/25/24   Zenaida Morene PARAS, MD  spironolactone  (ALDACTONE ) 25 MG tablet Take 1 tablet (25 mg total) by mouth daily. 04/19/24 05/19/24  Donette Ellouise LABOR, FNP  tamsulosin  (FLOMAX ) 0.4 MG CAPS capsule Take 0.4 mg by mouth daily.    [provider]  torsemide  (DEMADEX ) 20 MG tablet Take 3 tablets (60 mg total) by mouth daily. 04/20/24 07/19/24  Donette Ellouise LABOR, FNP    Physical Exam: Vitals:   05/09/24 2107 05/09/24 2109 05/09/24 2200  BP:  (!) 148/116 (!) 135/91  Pulse:  77 84  Resp:  (!) 30 (!) 25  Temp:  97.6 F (36.4 C)   SpO2:  99% 96%  Weight: 96.2 kg    Height: 5' 8 (1.727 m)     Physical Exam  Labs on Admission: I have personally reviewed following labs and imaging studies  CBC: Recent Labs  Lab 05/09/24 2113  WBC 4.6  NEUTROABS 2.5  HGB 13.7  HCT 44.8  MCV 93.1  PLT 114*   Basic Metabolic Panel: Recent Labs  Lab 05/09/24 2113  NA 144  K 3.6  CL 106  CO2 22  GLUCOSE 144*  BUN 46*  CREATININE 2.38*  CALCIUM  8.4*   GFR: Estimated Creatinine Clearance: 25.1 mL/min (A) (by C-G formula based on SCr of 2.38 mg/dL (H)). Liver Function Tests: Recent Labs  Lab 05/09/24 2113  AST 21  ALT 13  ALKPHOS 72  BILITOT 1.1  PROT 7.9  ALBUMIN  3.8   No results for input(s): LIPASE, AMYLASE in the last 168 hours. No results for input(s): AMMONIA in the last 168 hours. Coagulation Profile: No results for input(s): INR, PROTIME in the last 168 hours. Cardiac Enzymes: No results for input(s): CKTOTAL, CKMB, CKMBINDEX, TROPONINI in the last 168 hours. BNP (last 3 results) Recent Labs    08/09/23 1058  PROBNP 6,649*   HbA1C: No results for input(s): HGBA1C in the last 72 hours. CBG: No results for input(s): GLUCAP in the last 168 hours. Lipid Profile: No results for input(s): CHOL, HDL, LDLCALC, TRIG, CHOLHDL, LDLDIRECT in the last 72 hours. Thyroid Function Tests: No results for input(s): TSH, T4TOTAL,  FREET4, T3FREE, THYROIDAB in the last 72 hours. Anemia Panel: No results for input(s): VITAMINB12, FOLATE, FERRITIN, TIBC, IRON, RETICCTPCT in the last 72 hours. Urine analysis:    Component Value Date/Time   COLORURINE YELLOW (A) 03/23/2024  2042   APPEARANCEUR HAZY (A) 03/23/2024 2042   LABSPEC 1.015 03/23/2024 2042   PHURINE 6.0 03/23/2024 2042   GLUCOSEU >=500 (A) 03/23/2024 2042   HGBUR SMALL (A) 03/23/2024 2042   BILIRUBINUR NEGATIVE 03/23/2024 2042   KETONESUR NEGATIVE 03/23/2024 2042   PROTEINUR 100 (A) 03/23/2024 2042   NITRITE NEGATIVE 03/23/2024 2042   LEUKOCYTESUR LARGE (A) 03/23/2024 2042    Radiological Exams on Admission: DG Chest Portable 1 View Result Date: 05/09/2024 CLINICAL DATA:  Shortness of breath. EXAM: PORTABLE CHEST 1 VIEW COMPARISON:  Chest radiograph 03/24/2024, CT 12/15/2022 FINDINGS: Left-sided pacemaker in place. Prior median sternotomy. Previous left atrial clipping cardiomegaly stable. Mediastinal contours are unchanged. No pulmonary edema. No focal airspace disease. No pleural fluid or pneumothorax. Irregular densities projecting over the right mid lung correspond to calcifications chest wall on prior CT. IMPRESSION: 1. No acute chest findings. 2. Stable cardiomegaly. Electronically Signed   By: Andrea Gasman M.D.   On: 05/09/2024 21:46   Data Reviewed for HPI: Relevant notes from primary care and specialist visits, past discharge summaries as available in EHR, including Care Everywhere. Prior diagnostic testing as pertinent to current admission diagnoses Updated medications and problem lists for reconciliation ED course, including vitals, labs, imaging, treatment and response to treatment Triage notes, nursing and pharmacy notes and ED provider's notes Notable results as noted above in HPI      Assessment and Plan:   Acute on chronic HFrEF (heart failure with reduced ejection fraction) (HCC) IV Lasix  Continue home  carvedilol , Entresto , spironolactone  Daily weights with intake and output monitoring EF 25 to 30% on echo 12/2023  Elevated troponin with chest pressure CAD s/p CABG, multiple vessel CAD on LHC 05/2023 -Complains of chest pressure likely related to her increased work of breathing.  Suspect demand ischemia -History of right and left heart cath 05/2023 showing Significant underlying three-vessel coronary artery disease with patent grafts including LIMA to LAD, SVG to diagonal, sequential SVG to left circumflex and SVG to right PDA. Stable moderate stenosis in proximal SVG to right PDA  -Continue Imdur , Crestor , carvedilol  and apixaban  -Continue to trend troponin -Can consider cardiology consult if uptrending troponin   Acute renal failure superimposed on stage 3b chronic kidney disease (HCC) Likely secondary to increased torsemide  dose, possible cardiorenal syndrome Monitor renal function and avoid nephrotoxins   Thrombocytopenia  Has had thrombocytopenia before  No acute concerns.  Continue to monitor   History of hydrocele/varicocele Followed by urology    Pacemaker in place No acute issues suspected   Paroxysmal atrial fibrillation (HCC) Continue Coreg  and apixaban      DVT prophylaxis: Apixaban   Consults: none  Advance Care Planning:   Code Status: Prior   Family Communication: none  Disposition Plan: Back to previous home environment  Severity of Illness: The appropriate patient status for this patient is OBSERVATION. Observation status is judged to be reasonable and necessary in order to provide the required intensity of service to ensure the patient's safety. The patient's presenting symptoms, physical exam findings, and initial radiographic and laboratory data in the context of their medical condition is felt to place them at decreased risk for further clinical deterioration. Furthermore, it is anticipated that the patient will be medically stable for discharge from  the hospital within 2 midnights of admission.   Author: Delayne LULLA Solian, MD 05/09/2024 11:36 PM  For on call review [email or web address].

## 2024-05-09 NOTE — ED Provider Notes (Signed)
 [facility] Provider Note    Event Date/Time   First MD Initiated Contact with Patient 05/09/24 2133     (approximate)   History   Chest Pain   HPI  [name] is a 89 y.o. male who presents to the emergency department today because of concerns for chest tightness and shortness of breath.  Symptoms started 2 days ago.  Progressively getting worse.  Patient has history of ACS and CHF.  States he follows up with heart failure clinic and does occasionally get IV Lasix .  Denies any fevers.  Denies any chills.  No known sick contacts.     Physical Exam   Triage Vital Signs: ED Triage Vitals  Encounter Vitals Group     BP 05/09/24 2109 (!) 148/116     Girls Systolic BP Percentile --      Girls Diastolic BP Percentile --      Boys Systolic BP Percentile --      Boys Diastolic BP Percentile --      Pulse Rate 05/09/24 2109 77     Resp 05/09/24 2109 (!) 30     Temp 05/09/24 2109 97.6 F (36.4 C)     Temp src --      SpO2 05/09/24 2109 99 %     Weight 05/09/24 2107 212 lb (96.2 kg)     Height 05/09/24 2107 5' 8 (1.727 m)     Head Circumference --      Peak Flow --      Pain Score 05/09/24 2107 8     Pain Loc --      Pain Education --      Exclude from Growth Chart --     Most recent vital signs: Vitals:   05/09/24 2109  BP: (!) 148/116  Pulse: 77  Resp: (!) 30  Temp: 97.6 F (36.4 C)  SpO2: 99%   General: Awake, alert, oriented. CV:  Good peripheral perfusion. Regular rate. Resp:  Slightly increased work of breathing. Tachypnea. No wheezing. Abd:  No distention.    ED Results / Procedures / Treatments   Labs (all labs ordered are listed, but only abnormal results are displayed) Labs Reviewed  COMPREHENSIVE METABOLIC PANEL WITH GFR - Abnormal; Notable for the following components:      Result Value   Glucose, Bld 144 (*)    BUN 46 (*)    Creatinine, Ser 2.38 (*)    Calcium  8.4 (*)    GFR, Estimated 26 (*)    Anion gap 16 (*)     All other components within normal limits  CBC WITH DIFFERENTIAL/PLATELET - Abnormal; Notable for the following components:   Platelets 114 (*)    All other components within normal limits  BRAIN NATRIURETIC PEPTIDE - Abnormal; Notable for the following components:   B Natriuretic Peptide >4,500.0 (*)    All other components within normal limits  TROPONIN I (HIGH SENSITIVITY) - Abnormal; Notable for the following components:   Troponin I (High Sensitivity) 29 (*)    All other components within normal limits  TROPONIN I (HIGH SENSITIVITY)     EKG  I, Guadalupe Eagles, attending physician, personally viewed and interpreted this EKG  EKG Time: 2106 Rate: 77 Rhythm: atrial sensed ventricular paced rhythm Axis: normal Intervals: qtc 468 QRS: wide ST changes: no st elevation Impression: abnormal ekg   RADIOLOGY I independently interpreted and visualized the CXR. My interpretation: cardiomegaly, no pneumonia Radiology interpretation:  IMPRESSION:  1. No acute  chest findings.  2. Stable cardiomegaly.      PROCEDURES:  Critical Care performed: No   MEDICATIONS ORDERED IN ED: Medications - No data to display   IMPRESSION / MDM / ASSESSMENT AND PLAN / ED COURSE  I reviewed the triage vital signs and the nursing notes.                              Differential diagnosis includes, but is not limited to, CHF, pneumonia, COPD, PE, pneumothorax  Patient's presentation is most consistent with acute presentation with potential threat to life or bodily function.   The patient is on the cardiac monitor to evaluate for evidence of arrhythmia and/or significant heart rate changes.  Patient presented to the emergency department today because of concerns for shortness of breath and chest tightness.  On exam patient does have increased work of breathing.  Is tachypneic.  Lungs however without any significant wheezing.  EKG without ST elevation.  BNP was found to be significantly  elevated.  Troponin also slightly elevated however patient has history of elevated troponin and I would think this is related to heart failure.  Will however check second troponin.  Will give patient IV Lasix .  Given work of breathing we will plan on admission.  Discussed with Dr. Cleatus with the hospitalist service.      FINAL CLINICAL IMPRESSION(S) / ED DIAGNOSES   Final diagnoses:  Shortness of breath  Chest pain, unspecified type      Note:  This document was prepared using Dragon voice recognition software and may include unintentional dictation errors.    Floy Roberts, MD 05/10/24 [phone]

## 2024-05-10 DIAGNOSIS — R0602 Shortness of breath: Secondary | ICD-10-CM

## 2024-05-10 DIAGNOSIS — N189 Chronic kidney disease, unspecified: Secondary | ICD-10-CM | POA: Diagnosis not present

## 2024-05-10 DIAGNOSIS — I130 Hypertensive heart and chronic kidney disease with heart failure and stage 1 through stage 4 chronic kidney disease, or unspecified chronic kidney disease: Secondary | ICD-10-CM | POA: Diagnosis present

## 2024-05-10 DIAGNOSIS — N433 Hydrocele, unspecified: Secondary | ICD-10-CM | POA: Diagnosis present

## 2024-05-10 DIAGNOSIS — Z8674 Personal history of sudden cardiac arrest: Secondary | ICD-10-CM | POA: Diagnosis not present

## 2024-05-10 DIAGNOSIS — I255 Ischemic cardiomyopathy: Secondary | ICD-10-CM | POA: Diagnosis present

## 2024-05-10 DIAGNOSIS — N179 Acute kidney failure, unspecified: Secondary | ICD-10-CM | POA: Diagnosis present

## 2024-05-10 DIAGNOSIS — R079 Chest pain, unspecified: Secondary | ICD-10-CM

## 2024-05-10 DIAGNOSIS — D696 Thrombocytopenia, unspecified: Secondary | ICD-10-CM | POA: Diagnosis present

## 2024-05-10 DIAGNOSIS — Z951 Presence of aortocoronary bypass graft: Secondary | ICD-10-CM | POA: Diagnosis not present

## 2024-05-10 DIAGNOSIS — I480 Paroxysmal atrial fibrillation: Secondary | ICD-10-CM

## 2024-05-10 DIAGNOSIS — E669 Obesity, unspecified: Secondary | ICD-10-CM | POA: Diagnosis present

## 2024-05-10 DIAGNOSIS — E1169 Type 2 diabetes mellitus with other specified complication: Secondary | ICD-10-CM | POA: Diagnosis present

## 2024-05-10 DIAGNOSIS — Z794 Long term (current) use of insulin: Secondary | ICD-10-CM | POA: Diagnosis not present

## 2024-05-10 DIAGNOSIS — Z79899 Other long term (current) drug therapy: Secondary | ICD-10-CM | POA: Diagnosis not present

## 2024-05-10 DIAGNOSIS — N1832 Chronic kidney disease, stage 3b: Secondary | ICD-10-CM

## 2024-05-10 DIAGNOSIS — E876 Hypokalemia: Secondary | ICD-10-CM | POA: Diagnosis present

## 2024-05-10 DIAGNOSIS — Z7901 Long term (current) use of anticoagulants: Secondary | ICD-10-CM | POA: Diagnosis not present

## 2024-05-10 DIAGNOSIS — I5043 Acute on chronic combined systolic (congestive) and diastolic (congestive) heart failure: Secondary | ICD-10-CM

## 2024-05-10 DIAGNOSIS — Z8249 Family history of ischemic heart disease and other diseases of the circulatory system: Secondary | ICD-10-CM | POA: Diagnosis not present

## 2024-05-10 DIAGNOSIS — I10 Essential (primary) hypertension: Secondary | ICD-10-CM | POA: Diagnosis not present

## 2024-05-10 DIAGNOSIS — I2510 Atherosclerotic heart disease of native coronary artery without angina pectoris: Secondary | ICD-10-CM | POA: Diagnosis present

## 2024-05-10 DIAGNOSIS — E785 Hyperlipidemia, unspecified: Secondary | ICD-10-CM | POA: Diagnosis present

## 2024-05-10 DIAGNOSIS — N184 Chronic kidney disease, stage 4 (severe): Secondary | ICD-10-CM | POA: Diagnosis present

## 2024-05-10 DIAGNOSIS — I25118 Atherosclerotic heart disease of native coronary artery with other forms of angina pectoris: Secondary | ICD-10-CM | POA: Diagnosis not present

## 2024-05-10 DIAGNOSIS — Z7982 Long term (current) use of aspirin: Secondary | ICD-10-CM | POA: Diagnosis not present

## 2024-05-10 DIAGNOSIS — I861 Scrotal varices: Secondary | ICD-10-CM | POA: Diagnosis present

## 2024-05-10 DIAGNOSIS — E1122 Type 2 diabetes mellitus with diabetic chronic kidney disease: Secondary | ICD-10-CM | POA: Diagnosis present

## 2024-05-10 DIAGNOSIS — I472 Ventricular tachycardia, unspecified: Secondary | ICD-10-CM | POA: Diagnosis present

## 2024-05-10 DIAGNOSIS — Z515 Encounter for palliative care: Secondary | ICD-10-CM

## 2024-05-10 DIAGNOSIS — I5023 Acute on chronic systolic (congestive) heart failure: Secondary | ICD-10-CM | POA: Diagnosis not present

## 2024-05-10 DIAGNOSIS — R7989 Other specified abnormal findings of blood chemistry: Secondary | ICD-10-CM | POA: Diagnosis not present

## 2024-05-10 LAB — CBC
HCT: 42.4 % (ref 39.0–52.0)
Hemoglobin: 13.3 g/dL (ref 13.0–17.0)
MCH: 29 pg (ref 26.0–34.0)
MCHC: 31.4 g/dL (ref 30.0–36.0)
MCV: 92.6 fL (ref 80.0–100.0)
Platelets: 113 10*3/uL — ABNORMAL LOW (ref 150–400)
RBC: 4.58 MIL/uL (ref 4.22–5.81)
RDW: 15.5 % (ref 11.5–15.5)
WBC: 4.6 10*3/uL (ref 4.0–10.5)
nRBC: 0 % (ref 0.0–0.2)

## 2024-05-10 LAB — BASIC METABOLIC PANEL WITH GFR
Anion gap: 9 (ref 5–15)
BUN: 44 mg/dL — ABNORMAL HIGH (ref 8–23)
CO2: 27 mmol/L (ref 22–32)
Calcium: 8.2 mg/dL — ABNORMAL LOW (ref 8.9–10.3)
Chloride: 107 mmol/L (ref 98–111)
Creatinine, Ser: 2.23 mg/dL — ABNORMAL HIGH (ref 0.61–1.24)
GFR, Estimated: 28 mL/min — ABNORMAL LOW (ref >60)
Glucose, Bld: 132 mg/dL — ABNORMAL HIGH (ref 70–99)
Potassium: 3.4 mmol/L — ABNORMAL LOW (ref 3.5–5.1)
Sodium: 143 mmol/L (ref 135–145)

## 2024-05-10 LAB — CBG MONITORING, ED
Glucose-Capillary: 113 mg/dL — ABNORMAL HIGH (ref 70–99)
Glucose-Capillary: 124 mg/dL — ABNORMAL HIGH (ref 70–99)
Glucose-Capillary: 164 mg/dL — ABNORMAL HIGH (ref 70–99)

## 2024-05-10 LAB — GLUCOSE, CAPILLARY
Glucose-Capillary: 103 mg/dL — ABNORMAL HIGH (ref 70–99)
Glucose-Capillary: 110 mg/dL — ABNORMAL HIGH (ref 70–99)

## 2024-05-10 LAB — TROPONIN I (HIGH SENSITIVITY): Troponin I (High Sensitivity): 27 ng/L — ABNORMAL HIGH (ref <18)

## 2024-05-10 MED ORDER — FUROSEMIDE 10 MG/ML IJ SOLN
20.0000 mg | Freq: Once | INTRAMUSCULAR | Status: AC
  Administered 2024-05-10: 20 mg via INTRAVENOUS
  Filled 2024-05-10: qty 4

## 2024-05-10 MED ORDER — FUROSEMIDE 10 MG/ML IJ SOLN
60.0000 mg | Freq: Two times a day (BID) | INTRAMUSCULAR | Status: AC
  Administered 2024-05-10 – 2024-05-12 (×5): 60 mg via INTRAVENOUS
  Filled 2024-05-10 (×5): qty 6

## 2024-05-10 MED ORDER — METOLAZONE 2.5 MG PO TABS: 2.5000 mg | ORAL_TABLET | ORAL | Status: DC

## 2024-05-10 MED ORDER — SIMETHICONE 40 MG/0.6ML PO SUSP
80.0000 mg | Freq: Four times a day (QID) | ORAL | Status: DC | PRN
  Administered 2024-05-10 (×3): 80 mg via ORAL
  Filled 2024-05-10 (×2): qty 1.2
  Filled 2024-05-10: qty 30
  Filled 2024-05-10 (×3): qty 1.2
  Filled 2024-05-10: qty 30

## 2024-05-10 MED ORDER — APIXABAN 2.5 MG PO TABS
2.5000 mg | ORAL_TABLET | Freq: Two times a day (BID) | ORAL | Status: DC
Start: 2024-05-10 — End: 2024-05-13
  Administered 2024-05-10 – 2024-05-13 (×7): 2.5 mg via ORAL
  Filled 2024-05-10 (×8): qty 1

## 2024-05-10 MED ORDER — POTASSIUM CHLORIDE CRYS ER 20 MEQ PO TBCR
40.0000 meq | EXTENDED_RELEASE_TABLET | Freq: Once | ORAL | Status: AC
  Administered 2024-05-10: 40 meq via ORAL
  Filled 2024-05-10: qty 2

## 2024-05-10 NOTE — TOC Initial Note (Signed)
 Transition of Care [facility]) - Initial/Assessment Note    Patient Details  Name: [name] MRN: [id number] Date of Birth: 1935/06/06  Transition of Care [facility]) CM/SW Contact:    Dalia GORMAN Fuse, RN Phone Number: 05/10/2024, 10:45 AM  Clinical Narrative:                 The patient is from home with his wife and daughter. His wife and daughter assist him with his care. [facility] [address] his PCP is Dr. Marnie. He has DME : walker and cane at home. The patient reports that he is active with [facility] for PT and RN. The patient could not recall which agency. Per the chart review the patient is active with [facility].   The heart failure team will follow the patient for heart failure screening.    Expected Discharge Plan: Home w Home Health Services Barriers to Discharge: Continued Medical Work up   Patient Goals and CMS Choice            Expected Discharge Plan and Services In-house Referral: Clinical Social Work     Living arrangements for the past 2 months: Single Family Home                                      Prior Living Arrangements/Services Living arrangements for the past 2 months: Single Family Home Lives with:: Adult Children, Spouse Patient language and need for interpreter reviewed:: Yes Do you feel safe going back to the place where you live?: Yes        Care giver support system in place?: Yes (comment) Current home services: DME (walker and cane)    Activities of Daily Living   ADL Screening (condition at time of admission) Independently performs ADLs?: Yes (appropriate for developmental age) Is the patient deaf or have difficulty hearing?: No Does the patient have difficulty seeing, even when wearing glasses/contacts?: No Does the patient have difficulty concentrating, remembering, or making decisions?: No  Permission Sought/Granted                  Emotional Assessment Appearance:: Appears stated  age Attitude/Demeanor/Rapport: Engaged Affect (typically observed): Appropriate Orientation: : Oriented to Self, Oriented to Place, Oriented to  Time, Oriented to Situation   Psych Involvement: No (comment)  Admission diagnosis:  CHF exacerbation (HCC) [I50.9] Patient Active Problem List   Diagnosis Date Noted   Acute on chronic HFrEF (heart failure with reduced ejection fraction) (HCC) 03/24/2024   Scrotal edema 03/24/2024   Acute renal failure superimposed on stage 3b chronic kidney disease (HCC) 03/24/2024   Shortness of breath 12/24/2023   COVID-19 12/23/2023   CHF exacerbation (HCC) 12/23/2023   ACS (acute coronary syndrome) (HCC) 06/03/2023   Chronic systolic CHF (congestive heart failure) (HCC) 06/03/2023   Cardiac resynchronization therapy pacemaker (CRT-P) in place 05/10/2023   Acute on chronic congestive heart failure (HCC) 03/09/2023   Paroxysmal A-fib (HCC) 03/09/2023   Hypomagnesemia 03/09/2023   Hypercoagulable state due to paroxysmal atrial fibrillation (HCC) 02/08/2023   Paroxysmal atrial fibrillation (HCC) 02/08/2023   Constipation 12/19/2022   Chest pain 12/19/2022   Hypertensive heart disease with heart failure (HCC) 12/18/2022   Thrombocytopenia (HCC) 12/17/2022   CKD stage 3b, GFR 30-44 ml/min (HCC) 12/17/2022   Obesity (BMI 30-39.9) 12/17/2022   Hypokalemia 12/17/2022   Ischemic cardiomyopathy 12/17/2022   Hypertensive urgency 03/29/2022  Dyslipidemia 03/29/2022   GERD without esophagitis 03/29/2022   Type 2 diabetes mellitus with hyperlipidemia (HCC) 03/29/2022   Elevated troponin 03/29/2022   Acute on chronic systolic CHF (congestive heart failure) (HCC) 01/04/2022   Dilated cardiomyopathy (HCC) 10/30/2021   Acute decompensated heart failure (HCC) 10/27/2021   Complete heart block (HCC) 10/12/2021   Junctional bradycardia    Acute on chronic diastolic CHF (congestive heart failure) (HCC) 08/26/2021   CAD (coronary artery disease)    HLD  (hyperlipidemia)    GERD (gastroesophageal reflux disease)    Type II diabetes mellitus with renal manifestations (HCC)    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   CAD, multiple vessel 08/25/2020   Non-ST elevation (NSTEMI) myocardial infarction (HCC) 08/21/2020   History of cardiac arrest 08/21/2020   HFrEF (heart failure with reduced ejection fraction) (HCC)    History of sustained ventricular fibrillation 08/18/2020   Acute respiratory failure with hypoxia (HCC)    Chronic diastolic heart failure (HCC)    Urinary tract infection 07/26/2019   HTN (hypertension) 07/26/2019   Diabetes (HCC) 07/26/2019   Unstable angina (HCC) 12/03/2008   Status post percutaneous transluminal coronary angioplasty 10/08/2008   Malignant neoplasm of prostate (HCC) 10/05/2005   PCP:  [name], MD Pharmacy:   [phone] [address] [place] - [address] [place] [address] Phone: [phone] Fax: [phone]  [facility] - [facility] [address] [place] [address] Phone: [phone] Fax: [phone]     Social Drivers of Health (SDOH) Social History: SDOH Screenings   Food Insecurity: No Food Insecurity (05/10/2024)  Housing: Low Risk  (05/10/2024)  Transportation Needs: No Transportation Needs (05/10/2024)  Utilities: Not At Risk (05/10/2024)  Depression (PHQ2-9): Low Risk  (04/07/2022)  Financial Resource Strain: Low Risk  (06/29/2021)   [facility] from [facility]  Physical Activity: Inactive (07/27/2019)  Social Connections: Moderately Integrated (05/10/2024)  Stress: No Stress Concern Present (07/27/2019)  Tobacco Use: Medium Risk (05/09/2024)   SDOH Interventions:     Readmission Risk Interventions    03/26/2024    4:09 PM 03/10/2023   12:47 PM 03/29/2022    1:18 PM  Readmission Risk Prevention Plan  Transportation Screening Complete Complete Complete  PCP or Specialist Appt within 3-5 Days  Complete Complete  HRI or Home Care  Consult   --  Social Work Consult for Recovery Care Planning/Counseling  Complete --  Palliative Care Screening  Not Applicable Not Applicable  Medication Review Oceanographer) Complete Complete Complete  PCP or Specialist appointment within 3-5 days of discharge Complete    HRI or Home Care Consult Complete    SW Recovery Care/Counseling Consult Complete    Palliative Care Screening Not Applicable    Skilled Nursing Facility Not Applicable

## 2024-05-10 NOTE — Progress Notes (Signed)
 PROGRESS NOTE    [name]  FMW:[id number] DOB: 1935/07/06 DOA: 05/09/2024 PCP: [name], MD    Brief Narrative:   89 y.o. 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, CKD stage IIIb with last hospitalization 5/17 to 03/30/2024 for CHF exacerbation, being admitted with a CHF exacerbation after presenting with a 2-day history of anterior chest pressure and shortness of breath on exertion   7/3: cardio & Palliative care c/s   Assessment & Plan:   Principal Problem:   CHF exacerbation (HCC) Active Problems:   HTN (hypertension)   Acute on chronic HFrEF (heart failure with reduced ejection fraction) (HCC)   Acute renal failure superimposed on stage 3b chronic kidney disease (HCC)   CAD, multiple vessel   CAD (coronary artery disease)   Type 2 diabetes mellitus with hyperlipidemia (HCC)   Elevated troponin   Paroxysmal atrial fibrillation (HCC)   S/P CABG x 5   Acute on chronic HFrEF (heart failure with reduced ejection fraction) (HCC) Continue IV Lasix  Continue home carvedilol , Entresto , spironolactone  Daily weights with intake and output monitoring EF 25 to 30% on echo 12/2023 Cardio c/s - input appreciated   Elevated troponin with chest pressure CAD s/p CABG, multiple vessel CAD on LHC 05/2023 -Complains of chest pressure likely related to her increased work of breathing.  Likely due to demand ischemia -History of right and left heart cath 05/2023 showing Significant underlying three-vessel coronary artery disease with patent grafts including LIMA to LAD, SVG to diagonal, sequential SVG to left circumflex and SVG to right PDA. Stable moderate stenosis in proximal SVG to right PDA  -Continue Imdur , Crestor , carvedilol  and apixaban    Acute renal failure superimposed on stage 3b chronic kidney disease (HCC) Likely  secondary to increased torsemide  dose, possible cardiorenal syndrome Monitor renal function and avoid nephrotoxins   Thrombocytopenia  Has had thrombocytopenia before  No acute concerns.  Continue to monitor   History of hydrocele/varicocele Followed by urology as outpt    Pacemaker in place No acute issues suspected   Paroxysmal atrial fibrillation (HCC) Continue Coreg  and apixaban    DVT prophylaxis: Eliquis  apixaban  (ELIQUIS ) tablet 2.5 mg Start: 05/10/24 1000 apixaban  (ELIQUIS ) tablet 2.5 mg     Code Status: (Full code Family Communication: none at bedside Disposition Plan: possible D/C in 2-3 days depending on clinical condition and cardiac w/up   Consultants:  Cardio Palliative care    Subjective:  Dyspneic on exertion. No further chest pain  Objective: Vitals:   05/10/24 1520 05/10/24 1555 05/10/24 1700 05/10/24 1743  BP: 120/82  116/77 130/83  Pulse: 72  63 74  Resp: (!) 32  14 16  Temp:  98 F (36.7 C)  97.8 F (36.6 C)  TempSrc:  Oral    SpO2: 99%  98% 100%  Weight:      Height:        Intake/Output Summary (Last 24 hours) at 05/10/2024 1759 Last data filed at 05/10/2024 9391 Gross per 24 hour  Intake --  Output 700 ml  Net -700 ml   Filed Weights   05/09/24 2107  Weight: 96.2 kg    Examination:  General exam: Appears calm and comfortable  Respiratory system: Clear to auscultation. Respiratory effort normal. Cardiovascular system: S1 & S2 heard, Trace  b/l pedal edema. Rales at the bases Gastrointestinal system: Abdomen is soft, benign Central nervous system: Alert  and oriented. No focal neurological deficits. Extremities: Symmetric 5 x 5 power. Skin: No rashes, lesions or ulcers Psychiatry: Judgement and insight appear normal. Mood & affect appropriate.     Data Reviewed: I have personally reviewed following labs and imaging studies  CBC: Recent Labs  Lab 05/09/24 2113 05/10/24 0433  WBC 4.6 4.6  NEUTROABS 2.5  --   HGB 13.7  13.3  HCT 44.8 42.4  MCV 93.1 92.6  PLT 114* 113*   Basic Metabolic Panel: Recent Labs  Lab 05/09/24 2113 05/10/24 0433  NA 144 143  K 3.6 3.4*  CL 106 107  CO2 22 27  GLUCOSE 144* 132*  BUN 46* 44*  CREATININE 2.38* 2.23*  CALCIUM  8.4* 8.2*   GFR: Estimated Creatinine Clearance: 26.7 mL/min (A) (by C-G formula based on SCr of 2.23 mg/dL (H)). Liver Function Tests: Recent Labs  Lab 05/09/24 2113  AST 21  ALT 13  ALKPHOS 72  BILITOT 1.1  PROT 7.9  ALBUMIN  3.8    BNP (last 3 results) Recent Labs    08/09/23 1058  PROBNP 6,649*   HbA1C: No results for input(s): HGBA1C in the last 72 hours. CBG: Recent Labs  Lab 05/10/24 0047 05/10/24 0753 05/10/24 1145 05/10/24 1748  GLUCAP 124* 113* 164* 103*      Radiology Studies: DG Chest Portable 1 View Result Date: 05/09/2024 CLINICAL DATA:  Shortness of breath. EXAM: PORTABLE CHEST 1 VIEW COMPARISON:  Chest radiograph 03/24/2024, CT 12/15/2022 FINDINGS: Left-sided pacemaker in place. Prior median sternotomy. Previous left atrial clipping cardiomegaly stable. Mediastinal contours are unchanged. No pulmonary edema. No focal airspace disease. No pleural fluid or pneumothorax. Irregular densities projecting over the right mid lung correspond to calcifications chest wall on prior CT. IMPRESSION: 1. No acute chest findings. 2. Stable cardiomegaly. Electronically Signed   By: Andrea Gasman M.D.   On: 05/09/2024 21:46        Scheduled Meds:  apixaban   2.5 mg Oral BID   aspirin  EC  81 mg Oral Daily   carvedilol   6.25 mg Oral BID WC   dapagliflozin  propanediol  10 mg Oral Daily   ezetimibe   10 mg Oral Daily   furosemide   60 mg Intravenous BID   hydrALAZINE   25 mg Oral BID   insulin  aspart  0-15 Units Subcutaneous TID WC   insulin  aspart  0-5 Units Subcutaneous QHS   insulin  glargine-yfgn  15 Units Subcutaneous QHS   isosorbide  mononitrate  60 mg Oral Daily   [START ON 05/15/2024] metolazone   2.5 mg Oral Q7 days    potassium chloride  SA  40 mEq Oral Daily   rosuvastatin   40 mg Oral Daily   sacubitril -valsartan   1 tablet Oral BID   spironolactone   25 mg Oral Daily   tamsulosin   0.4 mg Oral Daily   Continuous Infusions:   LOS: 0 days    Time spent: 35 mins    Hetvi Shawhan Maree, MD [facility] Pager [phone]  If 7PM-7AM, please contact night-coverage [email or web address]  05/10/2024, 5:59 PM

## 2024-05-10 NOTE — Consult Note (Signed)
 PHARMACY CONSULT NOTE - ELECTROLYTES  Pharmacy Consult for Electrolyte Monitoring and Replacement   Recent Labs: Height: 5' 8 (172.7 cm) Weight: 96.2 kg (212 lb) IBW/kg (Calculated) : 68.4 Estimated Creatinine Clearance: 26.7 mL/min (A) (by C-G formula based on SCr of 2.23 mg/dL (H)). Potassium (mmol/L)  Date Value  05/10/2024 3.4 (L)  01/06/2014 3.0 (L)   Magnesium  (mg/dL)  Date Value  94/78/7974 2.0  01/06/2014 0.9 (L)   Calcium  (mg/dL)  Date Value  92/96/7974 8.2 (L)   Calcium , Total (mg/dL)  Date Value  96/98/7984 7.7 (L)   Albumin  (g/dL)  Date Value  92/97/7974 3.8  01/06/2014 3.5   Phosphorus (mg/dL)  Date Value  94/81/7974 3.1   Sodium (mmol/L)  Date Value  05/10/2024 143  08/09/2023 143  01/06/2014 142    Assessment  [name] is a 89 y.o. male presenting with CHF exacerbation. PMH significant for PAF on Eliquis , CAD, HTN, CHF, and CKD. Pharmacy has been consulted to monitor and replace electrolytes.  Diet: heart healthy/carb modified diet MIVF: N/A Pertinent medications: Potassium 40 mEq po daily, furosemide  40 mg IV BID, spironolactone , Entresto   Goal of Therapy: Electrolytes WNL  Plan:  K = 3.4, give additional Kcl 40 mEq po x 1 Check BMP, Mg, Phos with AM labs  Thank you for allowing pharmacy to be a part of this patient's care.  Kayla JULIANNA Blew, PharmD Clinical Pharmacist 05/10/2024 8:34 AM

## 2024-05-10 NOTE — Progress Notes (Signed)
 Heart Failure Navigator Progress Note  Assessed for Heart & Vascular TOC clinic readiness.  Patient does not meet criteria due to current  Advanced Heart Failure Team patient.  Patient was already scheduled with Ezra Shuck, MD for a 4 week follow-up @ [facility] on 05/18/2024 @ 2:45.  Appointment details included in patient AVS for this hospital visit to be included in his discharge.  Navigator will sign of at this time.   Charmaine Pines, RN, BSN [facility] Heart Failure Navigator Secure Chat Only

## 2024-05-10 NOTE — TOC Initial Note (Signed)
 Transition of Care [facility]) - Initial/Assessment Note    Patient Details  Name: [name] MRN: [id number] Date of Birth: Sep 17, 1935  Transition of Care [facility]) CM/SW Contact:    Edsel DELENA Fischer, LCSW Phone Number: 05/10/2024, 10:44 AM  Clinical Narrative:                 SW met with pt at bedside in ED.  Pt stated that he lives with his wife- [name] and daughterGLENWOOD Countryman and they will be assisting with transportation once discharged.  Pt reports HH services, unsure of agency name and has DME (cane, walker) and also a ramp outside of home.  Pt stated that he has PCP (Dr. Marnie)  and [facility]. Pt reports that he does not need assistance with ADLs. SW will continue to monitor pt in ED.   Expected Discharge Plan: Home w Home Health Services Barriers to Discharge: Continued Medical Work up   Patient Goals and CMS Choice            Expected Discharge Plan and Services In-house Referral: Clinical Social Work     Living arrangements for the past 2 months: Single Family Home                                      Prior Living Arrangements/Services Living arrangements for the past 2 months: Single Family Home Lives with:: Adult Children, Spouse Patient language and need for interpreter reviewed:: Yes Do you feel safe going back to the place where you live?: Yes        Care giver support system in place?: Yes (comment) Current home services: DME (walker and cane)    Activities of Daily Living   ADL Screening (condition at time of admission) Independently performs ADLs?: Yes (appropriate for developmental age) Is the patient deaf or have difficulty hearing?: No Does the patient have difficulty seeing, even when wearing glasses/contacts?: No Does the patient have difficulty concentrating, remembering, or making decisions?: No  Permission Sought/Granted                  Emotional Assessment Appearance:: Appears stated  age Attitude/Demeanor/Rapport: Engaged Affect (typically observed): Appropriate Orientation: : Oriented to Situation, Oriented to  Time, Oriented to Place, Oriented to Self   Psych Involvement: No (comment)  Admission diagnosis:  CHF exacerbation (HCC) [I50.9] Patient Active Problem List   Diagnosis Date Noted   Acute on chronic HFrEF (heart failure with reduced ejection fraction) (HCC) 03/24/2024   Scrotal edema 03/24/2024   Acute renal failure superimposed on stage 3b chronic kidney disease (HCC) 03/24/2024   Shortness of breath 12/24/2023   COVID-19 12/23/2023   CHF exacerbation (HCC) 12/23/2023   ACS (acute coronary syndrome) (HCC) 06/03/2023   Chronic systolic CHF (congestive heart failure) (HCC) 06/03/2023   Cardiac resynchronization therapy pacemaker (CRT-P) in place 05/10/2023   Acute on chronic congestive heart failure (HCC) 03/09/2023   Paroxysmal A-fib (HCC) 03/09/2023   Hypomagnesemia 03/09/2023   Hypercoagulable state due to paroxysmal atrial fibrillation (HCC) 02/08/2023   Paroxysmal atrial fibrillation (HCC) 02/08/2023   Constipation 12/19/2022   Chest pain 12/19/2022   Hypertensive heart disease with heart failure (HCC) 12/18/2022   Thrombocytopenia (HCC) 12/17/2022   CKD stage 3b, GFR 30-44 ml/min (HCC) 12/17/2022   Obesity (BMI 30-39.9) 12/17/2022   Hypokalemia 12/17/2022   Ischemic cardiomyopathy 12/17/2022   Hypertensive urgency 03/29/2022   Dyslipidemia  03/29/2022   GERD without esophagitis 03/29/2022   Type 2 diabetes mellitus with hyperlipidemia (HCC) 03/29/2022   Elevated troponin 03/29/2022   Acute on chronic systolic CHF (congestive heart failure) (HCC) 01/04/2022   Dilated cardiomyopathy (HCC) 10/30/2021   Acute decompensated heart failure (HCC) 10/27/2021   Complete heart block (HCC) 10/12/2021   Junctional bradycardia    Acute on chronic diastolic CHF (congestive heart failure) (HCC) 08/26/2021   CAD (coronary artery disease)    HLD  (hyperlipidemia)    GERD (gastroesophageal reflux disease)    Type II diabetes mellitus with renal manifestations (HCC)    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   CAD, multiple vessel 08/25/2020   Non-ST elevation (NSTEMI) myocardial infarction (HCC) 08/21/2020   History of cardiac arrest 08/21/2020   HFrEF (heart failure with reduced ejection fraction) (HCC)    History of sustained ventricular fibrillation 08/18/2020   Acute respiratory failure with hypoxia (HCC)    Chronic diastolic heart failure (HCC)    Urinary tract infection 07/26/2019   HTN (hypertension) 07/26/2019   Diabetes (HCC) 07/26/2019   Unstable angina (HCC) 12/03/2008   Status post percutaneous transluminal coronary angioplasty 10/08/2008   Malignant neoplasm of prostate (HCC) 10/05/2005   PCP:  [name], MD Pharmacy:   [phone] [address], [place] - [address] [place] [address] Phone: [phone] Fax: [phone]  [facility] - [facility] [address] [place] [address] Phone: [phone] Fax: [phone]     Social Drivers of Health (SDOH) Social History: SDOH Screenings   Food Insecurity: No Food Insecurity (05/10/2024)  Housing: Low Risk  (05/10/2024)  Transportation Needs: No Transportation Needs (05/10/2024)  Utilities: Not At Risk (05/10/2024)  Depression (PHQ2-9): Low Risk  (04/07/2022)  Financial Resource Strain: Low Risk  (06/29/2021)   [facility]  Physical Activity: Inactive (07/27/2019)  Social Connections: Moderately Integrated (05/10/2024)  Stress: No Stress Concern Present (07/27/2019)  Tobacco Use: Medium Risk (05/09/2024)   SDOH Interventions:     Readmission Risk Interventions    03/26/2024    4:09 PM 03/10/2023   12:47 PM 03/29/2022    1:18 PM  Readmission Risk Prevention Plan  Transportation Screening Complete Complete Complete  PCP or Specialist Appt within 3-5 Days  Complete Complete  HRI or Home Care  Consult   --  Social Work Consult for Recovery Care Planning/Counseling  Complete --  Palliative Care Screening  Not Applicable Not Applicable  Medication Review Oceanographer) Complete Complete Complete  PCP or Specialist appointment within 3-5 days of discharge Complete    HRI or Home Care Consult Complete    SW Recovery Care/Counseling Consult Complete    Palliative Care Screening Not Applicable    Skilled Nursing Facility Not Applicable

## 2024-05-10 NOTE — Consult Note (Signed)
 Consultation Note Date: 05/10/2024 at 1130  Patient Name: [name]  DOB: 1935-02-09  MRN: [id number]  Age / Sex: 89 y.o., male  PCP: [name], MD Referring Physician: Maree Hue, MD  HPI/Patient Profile: 89 y.o. male  with past medical history of PAF on Eliquis , CAD status post CABG x5 and multiple stents, ischemic cardiomyopathy (EF 25-30% 12/2023), hypertension, pacemaker secondary to heart block, CKD stage IIIb with last hospitalization 5/17 to 03/30/2024 for CHF exacerbation admitted on 05/09/2024 with 2-day history of anterior chest pain and shortness of breath on exertion.    Patient is being treated for CHF exacerbation, acute renal failure superimposed on CKD, and elevated troponin with known history of multivessel CAD.  PMT was consulted to support patient with goals of care discussions.   Clinical Assessment and Goals of Care: Extensive chart review completed prior to meeting patient including labs, vital signs, imaging, progress notes, orders, and available advanced directive documents from current and previous encounters. I then met with patient at bedside in ED to discuss diagnosis prognosis, GOC, EOL wishes, disposition and options.  Patient is awake, alert, and oriented x 4.  No family or friends present during my visit.  I introduced Palliative Medicine as specialized medical care for people living with serious illness. It focuses on providing relief from the symptoms and stress of a serious illness. The goal is to improve quality of life for both the patient and the family.  We discussed a brief life review of the patient.  He has been married for 59 years.  He worked in the [employer] before retirement.  He shares in retirement he enjoys doing nothing and resting.  He has 5 children and so many grandchildren that I cannot count them.  As far as functional and nutritional status  patient endorses he was able to complete ADLs independently and ambulated with a walker and cane when needed.  He denies difficulty with p.o. intake.  We discussed patient's current illness-acute exacerbation of COPD-and what it means in the larger context of patient's on-going co-morbidities-multivessel coronary artery disease and chronic kidney disease.   I attempted to elicit values and goals of care important to the patient.  He shares he never would want to be put on life support but that his wife and daughter reported want him to be on life support.    I attempted to elicit what is important to the patient and not others.  He shares that that he would never want to be a vegetable.  He shares that if he is just lying in the bed and I am no good to no one, even myself.    Extensive discussion of full code, DNR full interventions, and DNR with limited interventions.  Patient shares that he knows of a young girl who was put on a ventilator, lived for 15 years, and woke up, with full recovery.  Asked if this patient was 89 years old with the same acute on chronic heart failure, multivessel coronary artery disease, chronic  kidney disease, and same functional status as patient.  He shares that no, at this young girl was different.   Encouraged patient to consider DNR/DNI status understanding evidenced based poor outcomes in similar hospitalized patients, as the cause of the arrest is likely associated with chronic/terminal disease rather than a reversible acute cardio-pulmonary event.     Patient shares that he would like to remain a full code but would never want to live long-term with a tracheostomy.  He is accepting of all offered,, and appropriate medical interventions to sustain his life, but would never want to live long-term on the machines.  Patient shares he is feeling much better today than he did PTA.  He is hopeful to go home soon.  He has no acute complaints at this time.  He denies  headache, chest pain, N/V, or other acute ailments at this time.  No adjustment to [facility] needed.  Highlighted the significance of making his wishes known prior to an emergency situation.  He shares that he and his wife have had long discussions about his wishes.  He shares she does not know how to handle problems until it comes up.  Encouraged him to continue to keep his bicycles at the center of decision making.  He shares he is grateful for our visit.  No acute palliative needs identified at this time.  Full code and full scope remain.  PMT will step back from daily visits and monitor patient peripherally.  Please reengage at patient/family's request, if goals change, or if patient's health deteriorates during hospitalization.  Primary Decision Maker PATIENT  Physical Exam Vitals reviewed.  Constitutional:      Appearance: He is normal weight.  HENT:     Head: Normocephalic.  Eyes:     Pupils: Pupils are equal, round, and reactive to light.  Cardiovascular:     Rate and Rhythm: Normal rate.  Pulmonary:     Effort: Pulmonary effort is normal.  Skin:    General: Skin is warm and dry.  Neurological:     Mental Status: He is alert and oriented to person, place, and time.  Psychiatric:        Mood and Affect: Mood normal. Mood is not anxious.        Behavior: Behavior normal. Behavior is not agitated.     Palliative Assessment/Data: 60-70%     Thank you for this consult. Palliative medicine will continue to follow and assist holistically.   Time Total: 75 minutes  Time spent includes: Detailed review of medical records (labs, imaging, vital signs), medically appropriate exam (mental status, respiratory, cardiac, skin), discussed with treatment team, counseling and educating patient, family and staff, documenting clinical information, medication management and coordination of care.  Signed by: Lamarr Gunner, DNP, FNP-BC Palliative Medicine   Please contact Palliative  Medicine Team providers via [facility] for questions and concerns.

## 2024-05-10 NOTE — Hospital Course (Signed)
 SABRA
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
## 2024-05-10 NOTE — ED Notes (Signed)
 Rounded on pt he looked uncomfortable in the bed so I asked nurse if she could help me reposition pt

## 2024-05-10 NOTE — ED Notes (Signed)
 Patient alert and oriented, resting in bed, wife and additional family member present. No signs of distress currently. Patient has periodic episodes of dyspnea and labored breathing but recovers well. This RN clarified with patient that he DOES want to be a full code and receive life saving measures if he has cardiac or respiratory arrest. This RN has clarified with family that vitals are stable and upon this RN assessment, patient is currently stable and has no needs for intubation at this time.

## 2024-05-11 DIAGNOSIS — I5023 Acute on chronic systolic (congestive) heart failure: Secondary | ICD-10-CM | POA: Diagnosis not present

## 2024-05-11 DIAGNOSIS — I5043 Acute on chronic combined systolic (congestive) and diastolic (congestive) heart failure: Secondary | ICD-10-CM | POA: Diagnosis not present

## 2024-05-11 DIAGNOSIS — E785 Hyperlipidemia, unspecified: Secondary | ICD-10-CM | POA: Diagnosis not present

## 2024-05-11 DIAGNOSIS — N179 Acute kidney failure, unspecified: Secondary | ICD-10-CM | POA: Diagnosis not present

## 2024-05-11 DIAGNOSIS — I25118 Atherosclerotic heart disease of native coronary artery with other forms of angina pectoris: Secondary | ICD-10-CM | POA: Diagnosis not present

## 2024-05-11 DIAGNOSIS — E1169 Type 2 diabetes mellitus with other specified complication: Secondary | ICD-10-CM | POA: Diagnosis not present

## 2024-05-11 DIAGNOSIS — N189 Chronic kidney disease, unspecified: Secondary | ICD-10-CM | POA: Diagnosis not present

## 2024-05-11 LAB — CBC
HCT: 37.4 % — ABNORMAL LOW (ref 39.0–52.0)
Hemoglobin: 11.9 g/dL — ABNORMAL LOW (ref 13.0–17.0)
MCH: 28.5 pg (ref 26.0–34.0)
MCHC: 31.8 g/dL (ref 30.0–36.0)
MCV: 89.7 fL (ref 80.0–100.0)
Platelets: 107 K/uL — ABNORMAL LOW (ref 150–400)
RBC: 4.17 MIL/uL — ABNORMAL LOW (ref 4.22–5.81)
RDW: 15.3 % (ref 11.5–15.5)
WBC: 4.5 K/uL (ref 4.0–10.5)
nRBC: 0 % (ref 0.0–0.2)

## 2024-05-11 LAB — MAGNESIUM: Magnesium: 1.6 mg/dL — ABNORMAL LOW (ref 1.7–2.4)

## 2024-05-11 LAB — BASIC METABOLIC PANEL WITH GFR
Anion gap: 10 (ref 5–15)
BUN: 40 mg/dL — ABNORMAL HIGH (ref 8–23)
CO2: 25 mmol/L (ref 22–32)
Calcium: 8 mg/dL — ABNORMAL LOW (ref 8.9–10.3)
Chloride: 108 mmol/L (ref 98–111)
Creatinine, Ser: 1.89 mg/dL — ABNORMAL HIGH (ref 0.61–1.24)
GFR, Estimated: 34 mL/min — ABNORMAL LOW (ref >60)
Glucose, Bld: 70 mg/dL (ref 70–99)
Potassium: 3.3 mmol/L — ABNORMAL LOW (ref 3.5–5.1)
Sodium: 143 mmol/L (ref 135–145)

## 2024-05-11 LAB — GLUCOSE, CAPILLARY
Glucose-Capillary: 62 mg/dL — ABNORMAL LOW (ref 70–99)
Glucose-Capillary: 77 mg/dL (ref 70–99)
Glucose-Capillary: 163 mg/dL — ABNORMAL HIGH (ref 70–99)
Glucose-Capillary: 155 mg/dL — ABNORMAL HIGH (ref 70–99)
Glucose-Capillary: 77 mg/dL (ref 70–99)

## 2024-05-11 LAB — PHOSPHORUS: Phosphorus: 3.1 mg/dL (ref 2.5–4.6)

## 2024-05-11 MED ORDER — POTASSIUM CHLORIDE CRYS ER 20 MEQ PO TBCR
40.0000 meq | EXTENDED_RELEASE_TABLET | ORAL | Status: AC
  Administered 2024-05-11 (×2): 40 meq via ORAL
  Filled 2024-05-11 (×2): qty 2

## 2024-05-11 MED ORDER — POTASSIUM CHLORIDE CRYS ER 20 MEQ PO TBCR: 40.0000 meq | EXTENDED_RELEASE_TABLET | Freq: Once | ORAL | Status: DC

## 2024-05-11 MED ORDER — MAGNESIUM SULFATE 2 GM/50ML IV SOLN
2.0000 g | Freq: Once | INTRAVENOUS | Status: AC
  Administered 2024-05-11: 2 g via INTRAVENOUS
  Filled 2024-05-11: qty 50

## 2024-05-11 MED ORDER — POTASSIUM CHLORIDE CRYS ER 20 MEQ PO TBCR
40.0000 meq | EXTENDED_RELEASE_TABLET | Freq: Every day | ORAL | Status: DC
  Administered 2024-05-12 – 2024-05-13 (×2): 40 meq via ORAL
  Filled 2024-05-11 (×2): qty 2

## 2024-05-11 MED ORDER — INSULIN GLARGINE-YFGN 100 UNIT/ML ~~LOC~~ SOLN
5.0000 [IU] | Freq: Every day | SUBCUTANEOUS | Status: DC
  Administered 2024-05-12: 5 [IU] via SUBCUTANEOUS
  Filled 2024-05-11 (×3): qty 0.05

## 2024-05-11 NOTE — Plan of Care (Signed)

## 2024-05-11 NOTE — Progress Notes (Signed)
 Rounding Note   Patient Name: [name] Date of Encounter: 05/11/2024  [facility] Cardiologist: Deatrice Cage, MD   Subjective Patient seen on AM rounds. Denies any chest pain or shortness of breath. Swelling has improved. -2.6L output in the last 24 hours.  Still with mild abdominal distention Wife at the bedside  Scheduled Meds:  apixaban   2.5 mg Oral BID   aspirin  EC  81 mg Oral Daily   carvedilol   6.25 mg Oral BID WC   dapagliflozin  propanediol  10 mg Oral Daily   ezetimibe   10 mg Oral Daily   furosemide   60 mg Intravenous BID   hydrALAZINE   25 mg Oral BID   insulin  aspart  0-15 Units Subcutaneous TID WC   insulin  aspart  0-5 Units Subcutaneous QHS   insulin  glargine-yfgn  15 Units Subcutaneous QHS   isosorbide  mononitrate  60 mg Oral Daily   [START ON 05/15/2024] metolazone   2.5 mg Oral Q7 days   potassium chloride   40 mEq Oral Q4H   [START ON 05/12/2024] potassium chloride  SA  40 mEq Oral Daily   rosuvastatin   40 mg Oral Daily   sacubitril -valsartan   1 tablet Oral BID   spironolactone   25 mg Oral Daily   tamsulosin   0.4 mg Oral Daily   Continuous Infusions:  PRN Meds: acetaminophen  **OR** acetaminophen , albuterol , HYDROcodone -acetaminophen , ondansetron  **OR** ondansetron  (ZOFRAN ) IV, simethicone    Vital Signs  Vitals:   05/10/24 2311 05/11/24 0340 05/11/24 0500 05/11/24 0733  BP: 138/80 127/88  100/71  Pulse: 64 61  65  Resp: 20 18  16   Temp: (!) 97.4 F (36.3 C) (!) 97.5 F (36.4 C)  (!) 97.4 F (36.3 C)  TempSrc:      SpO2: 99% 100%  97%  Weight:   93.7 kg   Height:        Intake/Output Summary (Last 24 hours) at 05/11/2024 0936 Last data filed at 05/11/2024 9185 Gross per 24 hour  Intake 240 ml  Output 1900 ml  Net -1660 ml      05/11/2024    5:00 AM 05/09/2024    9:07 PM 04/19/2024   11:21 AM  Last 3 Weights  Weight (lbs) 206 lb 9.1 oz 212 lb 211 lb  Weight (kg) 93.7 kg 96.163 kg 95.709 kg      Telemetry V-paced - Personally  Reviewed  ECG  No new tracings - Personally Reviewed  Physical Exam  Constitutional:  oriented to person, place, and time. No distress.  HENT:  Head: Grossly normal Eyes:  no discharge. No scleral icterus.  Neck: JVD 8+, no carotid bruits  Cardiovascular: Regular rate and rhythm, no murmurs appreciated Pulmonary/Chest: Clear to auscultation bilaterally, no wheezes or rails Abdominal: Soft.  no distension.  no tenderness.  Musculoskeletal: Normal range of motion Neurological:  normal muscle tone. Coordination normal. No atrophy Skin: Skin warm and dry Psychiatric: normal affect, pleasant   Labs High Sensitivity Troponin:   Recent Labs  Lab 05/09/24 2100 05/09/24 2113  TROPONINIHS 27* 29*     Chemistry Recent Labs  Lab 05/09/24 2113 05/10/24 0433 05/11/24 0421  NA 144 143 143  K 3.6 3.4* 3.3*  CL 106 107 108  CO2 22 27 25   GLUCOSE 144* 132* 70  BUN 46* 44* 40*  CREATININE 2.38* 2.23* 1.89*  CALCIUM  8.4* 8.2* 8.0*  MG  --   --  1.6*  PROT 7.9  --   --   ALBUMIN  3.8  --   --  AST 21  --   --   ALT 13  --   --   ALKPHOS 72  --   --   BILITOT 1.1  --   --   GFRNONAA 26* 28* 34*  ANIONGAP 16* 9 10    Lipids No results for input(s): CHOL, TRIG, HDL, LABVLDL, LDLCALC, CHOLHDL in the last 168 hours.  Hematology Recent Labs  Lab 05/09/24 2113 05/10/24 0433 05/11/24 0421  WBC 4.6 4.6 4.5  RBC 4.81 4.58 4.17*  HGB 13.7 13.3 11.9*  HCT 44.8 42.4 37.4*  MCV 93.1 92.6 89.7  MCH 28.5 29.0 28.5  MCHC 30.6 31.4 31.8  RDW 15.5 15.5 15.3  PLT 114* 113* 107*   Thyroid No results for input(s): TSH, FREET4 in the last 168 hours.  BNP Recent Labs  Lab 05/09/24 2113  BNP >4,500.0*    DDimer No results for input(s): DDIMER in the last 168 hours.   Radiology  DG Chest Portable 1 View Result Date: 05/09/2024 CLINICAL DATA:  Shortness of breath. EXAM: PORTABLE CHEST 1 VIEW COMPARISON:  Chest radiograph 03/24/2024, CT 12/15/2022 FINDINGS: Left-sided  pacemaker in place. Prior median sternotomy. Previous left atrial clipping cardiomegaly stable. Mediastinal contours are unchanged. No pulmonary edema. No focal airspace disease. No pleural fluid or pneumothorax. Irregular densities projecting over the right mid lung correspond to calcifications chest wall on prior CT. IMPRESSION: 1. No acute chest findings. 2. Stable cardiomegaly. Electronically Signed   By: Andrea Gasman M.D.   On: 05/09/2024 21:46    Cardiac Studies 2D echo 12/24/2023  1. Left ventricular ejection fraction, by estimation, is 25 to 30%. Left  ventricular ejection fraction by PLAX is 27 %. The left ventricle has  severely decreased function. The left ventricle has no regional wall  motion abnormalities. Left ventricular  diastolic parameters are indeterminate.   2. Right ventricular systolic function is moderately reduced. The right  ventricular size is mildly enlarged. There is mildly elevated pulmonary  artery systolic pressure. The estimated right ventricular systolic  pressure is 41.5 mmHg.   3. The mitral valve is normal in structure. Mild mitral valve  regurgitation. No evidence of mitral stenosis.   4. The aortic valve is normal in structure. There is mild calcification  of the aortic valve. Aortic valve regurgitation is not visualized. Aortic  valve sclerosis/calcification is present, without any evidence of aortic  stenosis.   5. The inferior vena cava is normal in size with greater than 50%  respiratory variability, suggesting right atrial pressure of 3 mmHg.    Patient Profile   [name] is a 89 year old gentleman with history of VF arrest, coronary artery disease CABG times 12 August 2020, ischemic cardiomyopathy ejection fraction 25%, chronic systolic CHF, complete heart block, trifascicular block status post pacer, paroxysmal atrial fibrillation, chronic kidney disease stage III presenting with chest pain, worsening shortness of breath   A/p Acute on  chronic diastolic and systolic CHF - Ejection fraction February 2025 IS 25- 30%, slow decline over the past several years - Reports medication compliance with carvedilol , Farxiga , torsemide , hydralazine , isosorbide , metolazone  weekly, Entresto , spironolactone  -Denies excessive fluid intake -Treated this admission with IV Lasix  60 twice daily, would continue additional 24  hours -At time of discharge would recommend torsemide  40 daily with 60 mg 3 days a week -Also suggest metolazone  2 days a week, up from once a week   Coronary artery disease with stable angina - Prior cardiac catheterization July 2024 detailing patent grafts with moderate stenosis  of proximal vein graft to the right PDA - Continue isosorbide , simvastatin, carvedilol , Eliquis  in place of aspirin  -No plans for ischemic workup at this time   Chronic kidney disease Creatinine 2.2, creatinine down to 1.89 after diuresis, Possible component of cardiorenal syndrome Continue IV Lasix  24 hours   Paroxysmal atrial fibrillation 3% A-fib burden on prior device check Remains on Eliquis  2.5 twice daily Continue carvedilol  6.25 twice daily   Pacemaker Functioning appropriately   Essential hypertension Blood pressure stable  For questions or updates, please contact [facility] Please consult [email or web address] for contact info under    Signed, Velinda Lunger, MD, Ph.D [facility]

## 2024-05-11 NOTE — Progress Notes (Signed)
 PROGRESS NOTE    [name]  FMW:[id number] DOB: 1935-08-03 DOA: 05/09/2024 PCP: [name], MD    Brief Narrative:   89 y.o. 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, CKD stage IIIb with last hospitalization 5/17 to 03/30/2024 for CHF exacerbation, being admitted with a CHF exacerbation after presenting with a 2-day history of anterior chest pressure and shortness of breath on exertion   7/3: cardio & Palliative care c/s 7/4: Reducing dose of insulin  Semglee  he has blood sugar running low.  Replating electrolytes, ongoing diuresis   Assessment & Plan:   Principal Problem:   CHF exacerbation (HCC) Active Problems:   HTN (hypertension)   Acute on chronic HFrEF (heart failure with reduced ejection fraction) (HCC)   Acute renal failure superimposed on stage 3b chronic kidney disease (HCC)   CAD, multiple vessel   CAD (coronary artery disease)   Type 2 diabetes mellitus with hyperlipidemia (HCC)   Elevated troponin   Paroxysmal atrial fibrillation (HCC)   S/P CABG x 5   Acute on chronic HFrEF (heart failure with reduced ejection fraction) (HCC) Continue IV Lasix  60 mg twice daily.  Metolazone  2.5 mg every 7 days Continue home carvedilol , Entresto , spironolactone  Daily weights with intake and output monitoring EF 25 to 30% on echo 12/2023 Cardiology following - At discharge tomorrow, torsemide  40 mg daily with 60 mg 3 days a week and metolazone  2 days a week per cardiology   Elevated troponin with chest pressure CAD s/p CABG, multiple vessel CAD on LHC 05/2023 -Complains of chest pressure likely related to her increased work of breathing.  Likely due to demand ischemia -History of right and left heart cath 05/2023 showing Significant underlying three-vessel coronary artery disease with patent grafts including LIMA to LAD,  SVG to diagonal, sequential SVG to left circumflex and SVG to right PDA. Stable moderate stenosis in proximal SVG to right PDA  -Continue Imdur , Crestor , carvedilol  and apixaban    Acute renal failure superimposed on stage 3b chronic kidney disease (HCC) Likely secondary to increased torsemide  dose, possible cardiorenal syndrome Monitor renal function and avoid nephrotoxins   Thrombocytopenia  Has had thrombocytopenia before  No acute concerns.  Continue to monitor   History of hydrocele/varicocele Followed by urology as outpt    Pacemaker in place No acute issues suspected   Paroxysmal atrial fibrillation (HCC) Continue Coreg  and apixaban   Hypokalemia/hypomagnesemia Replete and recheck, pharmacy consult for electrolyte management   DVT prophylaxis: Eliquis  apixaban  (ELIQUIS ) tablet 2.5 mg Start: 05/10/24 1000 apixaban  (ELIQUIS ) tablet 2.5 mg     Code Status: Full code Family Communication: Wife updated at bedside Disposition Plan: possible D/C in 1 days depending on clinical condition and cardiac w/up   Consultants:  Cardio Palliative care    Subjective:  Feeling somewhat better.  Shortness of breath improving  Objective: Vitals:   05/11/24 0340 05/11/24 0500 05/11/24 0733 05/11/24 1136  BP: 127/88  100/71 122/77  Pulse: 61  65 65  Resp: 18  16 14   Temp: (!) 97.5 F (36.4 C)  (!) 97.4 F (36.3 C) (!) 97.5 F (36.4 C)  TempSrc:      SpO2: 100%  97% 100%  Weight:  93.7 kg    Height:        Intake/Output Summary (Last 24 hours) at 05/11/2024 1518 Last data filed at 05/11/2024 1257 Gross per 24 hour  Intake  720 ml  Output 2350 ml  Net -1630 ml   Filed Weights   05/09/24 2107 05/11/24 0500  Weight: 96.2 kg 93.7 kg    Examination:  General exam: Appears calm and comfortable  Respiratory system: Clear to auscultation. Respiratory effort normal. Cardiovascular system: S1 & S2 heard, Trace  b/l pedal edema.  Gastrointestinal system: Abdomen is soft,  benign Central nervous system: Alert and oriented. No focal neurological deficits. Extremities: Symmetric 5 x 5 power. Skin: No rashes, lesions or ulcers Psychiatry: Judgement and insight appear normal. Mood & affect appropriate.     Data Reviewed: I have personally reviewed following labs and imaging studies  CBC: Recent Labs  Lab 05/09/24 2113 05/10/24 0433 05/11/24 0421  WBC 4.6 4.6 4.5  NEUTROABS 2.5  --   --   HGB 13.7 13.3 11.9*  HCT 44.8 42.4 37.4*  MCV 93.1 92.6 89.7  PLT 114* 113* 107*   Basic Metabolic Panel: Recent Labs  Lab 05/09/24 2113 05/10/24 0433 05/11/24 0421  NA 144 143 143  K 3.6 3.4* 3.3*  CL 106 107 108  CO2 22 27 25   GLUCOSE 144* 132* 70  BUN 46* 44* 40*  CREATININE 2.38* 2.23* 1.89*  CALCIUM  8.4* 8.2* 8.0*  MG  --   --  1.6*  PHOS  --   --  3.1   GFR: Estimated Creatinine Clearance: 31.2 mL/min (A) (by C-G formula based on SCr of 1.89 mg/dL (H)). Liver Function Tests: Recent Labs  Lab 05/09/24 2113  AST 21  ALT 13  ALKPHOS 72  BILITOT 1.1  PROT 7.9  ALBUMIN  3.8    BNP (last 3 results) Recent Labs    08/09/23 1058  PROBNP 6,649*   HbA1C: No results for input(s): HGBA1C in the last 72 hours. CBG: Recent Labs  Lab 05/10/24 1748 05/10/24 2115 05/11/24 0737 05/11/24 0810 05/11/24 1138  GLUCAP 103* 110* 62* 77 163*      Radiology Studies: DG Chest Portable 1 View Result Date: 05/09/2024 CLINICAL DATA:  Shortness of breath. EXAM: PORTABLE CHEST 1 VIEW COMPARISON:  Chest radiograph 03/24/2024, CT 12/15/2022 FINDINGS: Left-sided pacemaker in place. Prior median sternotomy. Previous left atrial clipping cardiomegaly stable. Mediastinal contours are unchanged. No pulmonary edema. No focal airspace disease. No pleural fluid or pneumothorax. Irregular densities projecting over the right mid lung correspond to calcifications chest wall on prior CT. IMPRESSION: 1. No acute chest findings. 2. Stable cardiomegaly. Electronically  Signed   By: Andrea Gasman M.D.   On: 05/09/2024 21:46        Scheduled Meds:  apixaban   2.5 mg Oral BID   aspirin  EC  81 mg Oral Daily   carvedilol   6.25 mg Oral BID WC   dapagliflozin  propanediol  10 mg Oral Daily   ezetimibe   10 mg Oral Daily   furosemide   60 mg Intravenous BID   hydrALAZINE   25 mg Oral BID   insulin  aspart  0-15 Units Subcutaneous TID WC   insulin  aspart  0-5 Units Subcutaneous QHS   insulin  glargine-yfgn  15 Units Subcutaneous QHS   isosorbide  mononitrate  60 mg Oral Daily   [START ON 05/15/2024] metolazone   2.5 mg Oral Q7 days   [START ON 05/12/2024] potassium chloride  SA  40 mEq Oral Daily   rosuvastatin   40 mg Oral Daily   sacubitril -valsartan   1 tablet Oral BID   spironolactone   25 mg Oral Daily   tamsulosin   0.4 mg Oral Daily   Continuous Infusions:   LOS:  1 day    Time spent: 35 mins    Cresencio Fairly, MD [facility] Pager [phone]  If 7PM-7AM, please contact night-coverage [email or web address]  05/11/2024, 3:18 PM

## 2024-05-11 NOTE — Consult Note (Signed)
 PHARMACY CONSULT NOTE - ELECTROLYTES  Pharmacy Consult for Electrolyte Monitoring and Replacement   Recent Labs: Height: 5' 8 (172.7 cm) Weight: 93.7 kg (206 lb 9.1 oz) IBW/kg (Calculated) : 68.4 Estimated Creatinine Clearance: 31.2 mL/min (A) (by C-G formula based on SCr of 1.89 mg/dL (H)). Potassium (mmol/L)  Date Value  05/11/2024 3.3 (L)  01/06/2014 3.0 (L)   Magnesium  (mg/dL)  Date Value  92/95/7974 1.6 (L)  01/06/2014 0.9 (L)   Calcium  (mg/dL)  Date Value  92/95/7974 8.0 (L)   Calcium , Total (mg/dL)  Date Value  96/98/7984 7.7 (L)   Albumin  (g/dL)  Date Value  92/97/7974 3.8  01/06/2014 3.5   Phosphorus (mg/dL)  Date Value  92/95/7974 3.1   Sodium (mmol/L)  Date Value  05/11/2024 143  08/09/2023 143  01/06/2014 142    Assessment  [name] is a 89 y.o. male presenting with CHF exacerbation. PMH significant for PAF on Eliquis , CAD, HTN, CHF, and CKD. Pharmacy has been consulted to monitor and replace electrolytes.  Scr trending down.   Diet: heart healthy/carb modified diet MIVF: N/A Pertinent medications: Potassium 40 mEq po daily, furosemide  40 > 60 mg IV BID, spironolactone , Entresto   Goal of Therapy: Electrolytes WNL  Plan:  Kcl 40 mEq daily. Will give Kcl 40 mEq x 1 in the PM.  Mg 2 g IV x 1  F/u with AM labs.   Thank you for allowing pharmacy to be a part of this patient's care.  Cathaleen GORMAN Blanch, PharmD Clinical Pharmacist 05/11/2024 7:11 AM

## 2024-05-12 DIAGNOSIS — I255 Ischemic cardiomyopathy: Secondary | ICD-10-CM

## 2024-05-12 DIAGNOSIS — I5043 Acute on chronic combined systolic (congestive) and diastolic (congestive) heart failure: Secondary | ICD-10-CM | POA: Diagnosis not present

## 2024-05-12 DIAGNOSIS — N184 Chronic kidney disease, stage 4 (severe): Secondary | ICD-10-CM

## 2024-05-12 DIAGNOSIS — I10 Essential (primary) hypertension: Secondary | ICD-10-CM

## 2024-05-12 DIAGNOSIS — I25118 Atherosclerotic heart disease of native coronary artery with other forms of angina pectoris: Secondary | ICD-10-CM

## 2024-05-12 DIAGNOSIS — R7989 Other specified abnormal findings of blood chemistry: Secondary | ICD-10-CM | POA: Diagnosis not present

## 2024-05-12 DIAGNOSIS — I480 Paroxysmal atrial fibrillation: Secondary | ICD-10-CM | POA: Diagnosis not present

## 2024-05-12 LAB — BASIC METABOLIC PANEL WITH GFR
Anion gap: 9 (ref 5–15)
BUN: 39 mg/dL — ABNORMAL HIGH (ref 8–23)
CO2: 26 mmol/L (ref 22–32)
Calcium: 8 mg/dL — ABNORMAL LOW (ref 8.9–10.3)
Chloride: 105 mmol/L (ref 98–111)
Creatinine, Ser: 1.91 mg/dL — ABNORMAL HIGH (ref 0.61–1.24)
GFR, Estimated: 34 mL/min — ABNORMAL LOW (ref >60)
Glucose, Bld: 138 mg/dL — ABNORMAL HIGH (ref 70–99)
Potassium: 4.1 mmol/L (ref 3.5–5.1)
Sodium: 140 mmol/L (ref 135–145)

## 2024-05-12 LAB — CBC
HCT: 37.9 % — ABNORMAL LOW (ref 39.0–52.0)
Hemoglobin: 12 g/dL — ABNORMAL LOW (ref 13.0–17.0)
MCH: 28.9 pg (ref 26.0–34.0)
MCHC: 31.7 g/dL (ref 30.0–36.0)
MCV: 91.3 fL (ref 80.0–100.0)
Platelets: 116 K/uL — ABNORMAL LOW (ref 150–400)
RBC: 4.15 MIL/uL — ABNORMAL LOW (ref 4.22–5.81)
RDW: 15.3 % (ref 11.5–15.5)
WBC: 5.7 K/uL (ref 4.0–10.5)
nRBC: 0 % (ref 0.0–0.2)

## 2024-05-12 LAB — MAGNESIUM: Magnesium: 1.8 mg/dL (ref 1.7–2.4)

## 2024-05-12 LAB — GLUCOSE, CAPILLARY
Glucose-Capillary: 197 mg/dL — ABNORMAL HIGH (ref 70–99)
Glucose-Capillary: 118 mg/dL — ABNORMAL HIGH (ref 70–99)
Glucose-Capillary: 150 mg/dL — ABNORMAL HIGH (ref 70–99)
Glucose-Capillary: 137 mg/dL — ABNORMAL HIGH (ref 70–99)

## 2024-05-12 MED ORDER — TORSEMIDE 20 MG PO TABS
60.0000 mg | ORAL_TABLET | ORAL | Status: DC
  Administered 2024-05-13: 60 mg via ORAL
  Filled 2024-05-12: qty 3

## 2024-05-12 MED ORDER — TORSEMIDE 20 MG PO TABS: 40.0000 mg | ORAL_TABLET | ORAL | Status: DC

## 2024-05-12 MED ORDER — MAGNESIUM SULFATE 2 GM/50ML IV SOLN
2.0000 g | Freq: Once | INTRAVENOUS | Status: AC
  Administered 2024-05-12: 2 g via INTRAVENOUS
  Filled 2024-05-12: qty 50

## 2024-05-12 MED ORDER — METOLAZONE 2.5 MG PO TABS: 2.5000 mg | ORAL_TABLET | ORAL | Status: DC

## 2024-05-12 NOTE — Evaluation (Signed)
 Occupational Therapy Evaluation Patient Details Name: [name] MRN: [id number] DOB: 03-21-1935 Today's Date: 05/12/2024   History of Present Illness   Pt is a 89 y.o. male admitted with CHF exacerbation. PMH significant for PAF on Eliquis , CAD s/p CAD status post CABG x5 and multiple stents, ischemic cardiomyopathy (EF 25-30% 12/2023), pacemaker, CHF, back sx, and CKD.     Clinical Impressions Pt admitted with above diagnosis. Prior to hospital admission, pt was mod independent using RW. Pt lives with family who can provide 24/7 assist at discharge. Pt eager to mobilize as he states has not been OOB since hospitalization. Pt currently requires supervision for bed mobility, CGA - supervision for STS and step pivot to recliner using BSC. Seated UB / LB ADLs performed while seated EOB, no LOB while reaching outside BOS. VSS on RA. Pt would benefit from skilled OT services to address noted impairments and functional limitations (see below for any additional details) in order to maximize safety and independence while minimizing falls risk and caregiver burden. Anticipate the need for follow up [facility] OT services upon acute hospital DC.      If plan is discharge home, recommend the following:   A little help with walking and/or transfers;A little help with bathing/dressing/bathroom;Assist for transportation;Help with stairs or ramp for entrance     Functional Status Assessment   Patient has had a recent decline in their functional status and demonstrates the ability to make significant improvements in function in a reasonable and predictable amount of time.     Equipment Recommendations   BSC/3in1      Precautions/Restrictions   Precautions Precautions: ICD/Pacemaker;Fall Restrictions Weight Bearing Restrictions Per Provider Order: No     Mobility Bed Mobility Overal bed mobility: Needs Assistance Bed Mobility: Supine to Sit     Supine to sit: Supervision, HOB elevated      General bed mobility comments: no physical assist    Transfers Overall transfer level: Needs assistance Equipment used: Rolling walker (2 wheels) Transfers: Sit to/from Stand, Bed to chair/wheelchair/BSC Sit to Stand: Contact guard assist, Supervision     Step pivot transfers: Contact guard assist     General transfer comment: CGA - supervision for safety, cues for hand placement with good carryover of technique      Balance Overall balance assessment: History of Falls, Needs assistance Sitting-balance support: No upper extremity supported, Feet supported Sitting balance-[name]: Good Sitting balance - Comments: no LOB ~10 mins UB bathing/LB dressing   Standing balance support: During functional activity, Bilateral upper extremity supported Standing balance-[name]: Good Standing balance comment: able to stand without UE support briefly; balance improves with UE on RW                           ADL either performed or assessed with clinical judgement   ADL Overall ADL's : Needs assistance/impaired Eating/Feeding: Independent;Sitting   Grooming: Wash/dry hands;Wash/dry face;Sitting;Set up Grooming Details (indicate cue type and reason): seated EOB Upper Body Bathing: Set up;Sitting Upper Body Bathing Details (indicate cue type and reason): seated EOB         Lower Body Dressing: Set up;Sit to/from stand Lower Body Dressing Details (indicate cue type and reason): dons socks, no LOB while reaching outside BOS Toilet Transfer: Contact guard assist;Supervision/safety;Stand-pivot;BSC/3in1;Rolling walker (2 wheels) Toilet Transfer Details (indicate cue type and reason): simulated to recliner Toileting- Clothing Manipulation and Hygiene: Minimal assistance;Sit to/from stand       Functional mobility  during ADLs: Supervision/safety;Rolling walker (2 wheels) General ADL Comments: anticipate up to MIN A for LB ADLs, pt likley deconditioned from  hospitialization     Vision Baseline Vision/History: 1 Wears glasses Ability to See in Adequate Light: 0 Adequate Patient Visual Report: No change from baseline Vision Assessment?: Wears glasses for reading;Wears glasses for driving            Pertinent Vitals/Pain Pain Assessment Pain Assessment: No/denies pain     Extremity/Trunk Assessment Upper Extremity Assessment Upper Extremity Assessment: Right hand dominant (5/5 strength RUE, 4+/5 LUE)   Lower Extremity Assessment Lower Extremity Assessment: Defer to PT evaluation;Overall [facility] for tasks assessed   Cervical / Trunk Assessment Cervical / Trunk Assessment: Normal   Communication Communication Communication: Impaired Factors Affecting Communication: Reduced clarity of speech   Cognition Arousal: Alert Behavior During Therapy: WFL for tasks assessed/performed Cognition: No apparent impairments             OT - Cognition Comments: A&Ox4                 Following commands: Intact       Cueing  General Comments   Cueing Techniques: Verbal cues  VSS, pt on RA throughout. HR 70s at rest, up to 94 bpm with transfer, SpO2 98%>. Pt with mild dyspnea on exertion, utlized PLB throughout           Home Living Family/patient expects to be discharged to:: Private residence Living Arrangements: Spouse/significant other Available Help at Discharge: Family Type of Home: House Home Access: Stairs to enter Secretary/administrator of Steps: 5 Entrance Stairs-Rails: None Home Layout: One level     Bathroom Shower/Tub: Producer, television/film/video: Handicapped height Bathroom Accessibility: Yes How Accessible: Accessible via walker Home Equipment: Cane - single point;Rolling Walker (2 wheels);Shower seat;Grab bars - toilet;Grab bars - tub/shower          Prior Functioning/Environment Prior Level of Function : Independent/Modified Independent             Mobility Comments: Mod I using RW, 2  falls in the past 6 months ADLs Comments: MOD I, family assist PRN, drives    OT Problem List: Cardiopulmonary status limiting activity;Decreased knowledge of use of DME or AE;Decreased activity tolerance;Impaired balance (sitting and/or standing)   OT Treatment/Interventions: Self-care/ADL training;Energy conservation;DME and/or AE instruction;Therapeutic exercise;Therapeutic activities;Patient/family education;Balance training      OT Goals(Current goals can be found in the care plan section)   Acute Rehab OT Goals OT Goal Formulation: With patient Time For Goal Achievement: 05/26/24 Potential to Achieve Goals: Good   OT Frequency:  Min 2X/week       AM-PAC OT 6 Clicks Daily Activity     Outcome Measure Help from another person eating meals?: None Help from another person taking care of personal grooming?: None Help from another person toileting, which includes using toliet, bedpan, or urinal?: A Little Help from another person bathing (including washing, rinsing, drying)?: A Little Help from another person to put on and taking off regular upper body clothing?: None Help from another person to put on and taking off regular lower body clothing?: A Little 6 Click Score: 21   End of Session Equipment Utilized During Treatment: Gait belt;Rolling walker (2 wheels) Nurse Communication: Mobility status  Activity Tolerance: Patient tolerated treatment well Patient left: in chair;with call bell/phone within reach;with chair alarm set  OT Visit Diagnosis: History of falling (Z91.81);Other abnormalities of gait and mobility (R26.89)  Time: 9057-8988 OT Time Calculation (min): 29 min Charges:  OT General Charges $OT Visit: 1 Visit OT Evaluation $OT Eval Low Complexity: 1 Low OT Treatments $Self Care/Home Management : 8-22 mins Jenice Leiner L. Camron Essman, OTR/L  05/12/24, 10:18 AM

## 2024-05-12 NOTE — Progress Notes (Signed)
 Rounding Note   Patient Name: [name] Date of Encounter: 05/12/2024  [facility] Cardiologist: Deatrice Cage, MD   Subjective Patient seen on a.m. rounds.  Denies any chest pain or shortness of breath.  Sitting up in the recliner.  -1.3 liters output in the last 24 hours.  Abdominal distention has improved.  Scheduled Meds:  apixaban   2.5 mg Oral BID   aspirin  EC  81 mg Oral Daily   carvedilol   6.25 mg Oral BID WC   dapagliflozin  propanediol  10 mg Oral Daily   ezetimibe   10 mg Oral Daily   furosemide   60 mg Intravenous BID   hydrALAZINE   25 mg Oral BID   insulin  aspart  0-15 Units Subcutaneous TID WC   insulin  aspart  0-5 Units Subcutaneous QHS   insulin  glargine-yfgn  5 Units Subcutaneous QHS   isosorbide  mononitrate  60 mg Oral Daily   [START ON 05/15/2024] metolazone   2.5 mg Oral Q7 days   potassium chloride  SA  40 mEq Oral Daily   rosuvastatin   40 mg Oral Daily   sacubitril -valsartan   1 tablet Oral BID   spironolactone   25 mg Oral Daily   tamsulosin   0.4 mg Oral Daily   Continuous Infusions:  magnesium  sulfate bolus IVPB 2 g (05/12/24 1018)   PRN Meds: acetaminophen  **OR** acetaminophen , albuterol , HYDROcodone -acetaminophen , ondansetron  **OR** ondansetron  (ZOFRAN ) IV, simethicone    Vital Signs  Vitals:   05/11/24 2326 05/12/24 0400 05/12/24 0500 05/12/24 0812  BP: (!) 125/58 116/82  (!) 94/53  Pulse: 60 61  66  Resp: 20 20  16   Temp: 97.7 F (36.5 C) 98.5 F (36.9 C)  98.6 F (37 C)  TempSrc:    Oral  SpO2: 100% 97%  100%  Weight:   94.5 kg   Height:        Intake/Output Summary (Last 24 hours) at 05/12/2024 1020 Last data filed at 05/12/2024 0400 Gross per 24 hour  Intake 868.27 ml  Output 2250 ml  Net -1381.73 ml      05/12/2024    5:00 AM 05/11/2024    5:00 AM 05/09/2024    9:07 PM  Last 3 Weights  Weight (lbs) 208 lb 5.4 oz 206 lb 9.1 oz 212 lb  Weight (kg) 94.5 kg 93.7 kg 96.163 kg      Telemetry V paced- Personally Reviewed  ECG   No new tracings- Personally Reviewed  Physical Exam  GEN: No acute distress.   Neck: + JVD Cardiac: RRR, no murmurs, rubs, or gallops.  Respiratory: Clear to auscultation bilaterally. GI: Soft, nontender, non-distended  MS: No edema; No deformity. Neuro:  Nonfocal  Psych: Normal affect   Labs High Sensitivity Troponin:   Recent Labs  Lab 05/09/24 2100 05/09/24 2113  TROPONINIHS 27* 29*     Chemistry Recent Labs  Lab 05/09/24 2113 05/10/24 0433 05/11/24 0421 05/12/24 0353  NA 144 143 143 140  K 3.6 3.4* 3.3* 4.1  CL 106 107 108 105  CO2 22 27 25 26   GLUCOSE 144* 132* 70 138*  BUN 46* 44* 40* 39*  CREATININE 2.38* 2.23* 1.89* 1.91*  CALCIUM  8.4* 8.2* 8.0* 8.0*  MG  --   --  1.6* 1.8  PROT 7.9  --   --   --   ALBUMIN  3.8  --   --   --   AST 21  --   --   --   ALT 13  --   --   --  ALKPHOS 72  --   --   --   BILITOT 1.1  --   --   --   GFRNONAA 26* 28* 34* 34*  ANIONGAP 16* 9 10 9     Lipids No results for input(s): CHOL, TRIG, HDL, LABVLDL, LDLCALC, CHOLHDL in the last 168 hours.  Hematology Recent Labs  Lab 05/10/24 0433 05/11/24 0421 05/12/24 0353  WBC 4.6 4.5 5.7  RBC 4.58 4.17* 4.15*  HGB 13.3 11.9* 12.0*  HCT 42.4 37.4* 37.9*  MCV 92.6 89.7 91.3  MCH 29.0 28.5 28.9  MCHC 31.4 31.8 31.7  RDW 15.5 15.3 15.3  PLT 113* 107* 116*   Thyroid No results for input(s): TSH, FREET4 in the last 168 hours.  BNP Recent Labs  Lab 05/09/24 2113  BNP >4,500.0*    DDimer No results for input(s): DDIMER in the last 168 hours.   Radiology  No results found.  Cardiac Studies 2D echo 12/24/2023  1. Left ventricular ejection fraction, by estimation, is 25 to 30%. Left  ventricular ejection fraction by PLAX is 27 %. The left ventricle has  severely decreased function. The left ventricle has no regional wall  motion abnormalities. Left ventricular  diastolic parameters are indeterminate.   2. Right ventricular systolic function is  moderately reduced. The right  ventricular size is mildly enlarged. There is mildly elevated pulmonary  artery systolic pressure. The estimated right ventricular systolic  pressure is 41.5 mmHg.   3. The mitral valve is normal in structure. Mild mitral valve  regurgitation. No evidence of mitral stenosis.   4. The aortic valve is normal in structure. There is mild calcification  of the aortic valve. Aortic valve regurgitation is not visualized. Aortic  valve sclerosis/calcification is present, without any evidence of aortic  stenosis.   5. The inferior vena cava is normal in size with greater than 50%  respiratory variability, suggesting right atrial pressure of 3 mmHg.     Patient Profile   89 y.o. 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, paroxysmal atrial fibrillation, chronic kidney disease stage III, who is being seen and evaluated for chest pain and heart failure exacerbation  Assessment & Plan  Acute on chronic diastolic and systolic congestive heart failure/ischemic cardiomyopathy -Status post Abbott CRT-P device in place -Presented with worsening shortness of breath and abdominal swelling -BNP greater than 4500 - -1.3 L output in the last 24 hours -Ejection fraction February 2025 was 25-30%, slow decline over the past several years -Reports medication compliance with carvedilol , Farxiga , torsemide , hydralazine , isosorbide , metolazone , Entresto , spironolactone  -Denies excessive fluid intake - Had been on IV Lasix  60 mg twice daily with good urine output - Previous improvement in kidney function revealing cardiorenal component but now creatinine has started to climb -Can transition to torsemide  40 mg daily alternating with 60 mg 3 days a week -Would also recommend increasing metolazone   from weekly to 2 days a week -Continue to follow with advanced heart failure in outpatient setting -Daily weights and I's and O's  Coronary artery disease with stable angina -On arrival complaint of chest pressure related to increased work of breathing -High-sensitivity troponins trended flat at 27 and 29 not consistent with ACS -Last heart catheterization 7/24 showed patent grafts with moderate stenosis to proximal saphenous to the right PDA -Continued on Imdur , simvastatin, carvedilol , apixaban  and lieu of  aspirin  -EKG with no acute changes noted -No plans for further ischemic evaluation at this time  Chronic kidney disease -Serum creatinine 1.9 -Has improved with diuresis -Continue to monitor urine output -Monitor/trend/replete electrolytes as needed -Daily BMP -Avoid nephrotoxic agents were able  Paroxysmal atrial fibrillation -Paced on telemetry -Continue on apixaban  2.5 mg twice daily (reduced dosing due to age and kidney function for CHA2DS2-VASc score of at least 5 for stroke prophylaxis -Continued on telemetry monitoring -Continue on carvedilol  6.25 mg twice daily  Cardiac pacemaker in situ - Functioning appropriately  Hyperlipidemia - LDL 51 -Continued on rosuvastatin  and ezetimibe   Primary hypertension -Blood pressure 94/53 -Continue on current medication regimen - Vital signs per unit protocol   For questions or updates, please contact [facility] Please consult [email or web address] for contact info under     Signed, Saron Vanorman, NP  05/12/2024, 10:20 AM

## 2024-05-12 NOTE — Progress Notes (Signed)
 PROGRESS NOTE    [name]  FMW:[id number] DOB: 04-Aug-1935 DOA: 05/09/2024 PCP: [name], MD    Brief Narrative:   89 y.o. 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, CKD stage IIIb with last hospitalization 5/17 to 03/30/2024 for CHF exacerbation, being admitted with a CHF exacerbation after presenting with a 2-day history of anterior chest pressure and shortness of breath on exertion   7/3: cardio & Palliative care c/s 7/4: Reducing dose of insulin  Semglee  he has blood sugar running low.  Replating electrolytes, ongoing diuresis 7/5: ongoing Diuresis   Assessment & Plan:   Principal Problem:   CHF exacerbation (HCC) Active Problems:   HTN (hypertension)   Acute on chronic HFrEF (heart failure with reduced ejection fraction) (HCC)   Acute renal failure superimposed on stage 3b chronic kidney disease (HCC)   CAD, multiple vessel   CAD (coronary artery disease)   Type 2 diabetes mellitus with hyperlipidemia (HCC)   Elevated troponin   Paroxysmal atrial fibrillation (HCC)   S/P CABG x 5   Acute on chronic HFrEF (heart failure with reduced ejection fraction) (HCC) Continue IV Lasix  60 mg twice daily.  Metolazone  2.5 mg every 7 days Continue home carvedilol , Entresto , spironolactone , Isosorbide , entresto  Daily weights with intake and output monitoring EF 25 to 30% on echo 12/2023 Cardiology recommends - one more day of IV diuresis. plan to transition to oral diuretic tomorrow AM. -transition to 60 mg torsemide  daily alternating with 40 mg torsemide  daily  - metolazone  twice weekly (on 60 mg torsemide  days)   Elevated troponin with chest pressure CAD s/p CABG, multiple vessel CAD on LHC 05/2023 -Complains of chest pressure likely related to her increased work of breathing.  Likely due to demand ischemia -History of  right and left heart cath 05/2023 showing Significant underlying three-vessel coronary artery disease with patent grafts including LIMA to LAD, SVG to diagonal, sequential SVG to left circumflex and SVG to right PDA. Stable moderate stenosis in proximal SVG to right PDA  -Continue Imdur , Crestor , carvedilol  and apixaban    Acute renal failure superimposed on stage 3b chronic kidney disease (HCC) Likely secondary to increased torsemide  dose, possible cardiorenal syndrome Monitor renal function and avoid nephrotoxins   Thrombocytopenia  Has had thrombocytopenia before  No acute concerns.  Continue to monitor   History of hydrocele/varicocele Followed by urology as outpt    Pacemaker in place No acute issues suspected   Paroxysmal atrial fibrillation (HCC) Continue Coreg  and apixaban   Hypokalemia/hypomagnesemia Replete and recheck, pharmacy consult for electrolyte management   DVT prophylaxis: Eliquis  apixaban  (ELIQUIS ) tablet 2.5 mg Start: 05/10/24 1000 apixaban  (ELIQUIS ) tablet 2.5 mg     Code Status: Full code Family Communication: Wife updated at bedside Disposition Plan: possible D/C in 1 days depending on clinical condition and cardiac w/up   Consultants:  Cardio Palliative care    Subjective:  Sitting in the chair, feeling better, less SOB  Objective: Vitals:   05/12/24 0500 05/12/24 0812 05/12/24 1155 05/12/24 1534  BP:  (!) 94/53 109/70 119/75  Pulse:  66 64 63  Resp:  16 16 17   Temp:  98.6 F (37 C) 97.6 F (36.4 C) 98.1 F (36.7 C)  TempSrc:  Oral Oral Oral  SpO2:  100% 100% 99%  Weight: 94.5 kg     Height:        Intake/Output Summary (Last  24 hours) at 05/12/2024 1609 Last data filed at 05/12/2024 1457 Gross per 24 hour  Intake 1108.27 ml  Output 1875 ml  Net -766.73 ml   Filed Weights   05/09/24 2107 05/11/24 0500 05/12/24 0500  Weight: 96.2 kg 93.7 kg 94.5 kg    Examination:  General exam: Appears calm and comfortable  Respiratory  system: Clear to auscultation. Respiratory effort normal. Cardiovascular system: S1 & S2 heard, Trace  b/l pedal edema.  Gastrointestinal system: Abdomen is soft, benign Central nervous system: Alert and oriented. No focal neurological deficits. Extremities: Symmetric 5 x 5 power. Skin: No rashes, lesions or ulcers Psychiatry: Judgement and insight appear normal. Mood & affect appropriate.     Data Reviewed: I have personally reviewed following labs and imaging studies  CBC: Recent Labs  Lab 05/09/24 2113 05/10/24 0433 05/11/24 0421 05/12/24 0353  WBC 4.6 4.6 4.5 5.7  NEUTROABS 2.5  --   --   --   HGB 13.7 13.3 11.9* 12.0*  HCT 44.8 42.4 37.4* 37.9*  MCV 93.1 92.6 89.7 91.3  PLT 114* 113* 107* 116*   Basic Metabolic Panel: Recent Labs  Lab 05/09/24 2113 05/10/24 0433 05/11/24 0421 05/12/24 0353  NA 144 143 143 140  K 3.6 3.4* 3.3* 4.1  CL 106 107 108 105  CO2 22 27 25 26   GLUCOSE 144* 132* 70 138*  BUN 46* 44* 40* 39*  CREATININE 2.38* 2.23* 1.89* 1.91*  CALCIUM  8.4* 8.2* 8.0* 8.0*  MG  --   --  1.6* 1.8  PHOS  --   --  3.1  --    GFR: Estimated Creatinine Clearance: 30.9 mL/min (A) (by C-G formula based on SCr of 1.91 mg/dL (H)). Liver Function Tests: Recent Labs  Lab 05/09/24 2113  AST 21  ALT 13  ALKPHOS 72  BILITOT 1.1  PROT 7.9  ALBUMIN  3.8    BNP (last 3 results) Recent Labs    08/09/23 1058  PROBNP 6,649*   HbA1C: No results for input(s): HGBA1C in the last 72 hours. CBG: Recent Labs  Lab 05/11/24 1138 05/11/24 1536 05/11/24 1958 05/12/24 0810 05/12/24 1152  GLUCAP 163* 155* 77 197* 118*      Radiology Studies: No results found.       Scheduled Meds:  apixaban   2.5 mg Oral BID   carvedilol   6.25 mg Oral BID WC   dapagliflozin  propanediol  10 mg Oral Daily   ezetimibe   10 mg Oral Daily   furosemide   60 mg Intravenous BID   hydrALAZINE   25 mg Oral BID   insulin  aspart  0-15 Units Subcutaneous TID WC   insulin  aspart   0-5 Units Subcutaneous QHS   insulin  glargine-yfgn  5 Units Subcutaneous QHS   isosorbide  mononitrate  60 mg Oral Daily   [START ON 05/15/2024] metolazone   2.5 mg Oral Q7 days   [START ON 05/13/2024] metolazone   2.5 mg Oral Q Sat   potassium chloride  SA  40 mEq Oral Daily   rosuvastatin   40 mg Oral Daily   sacubitril -valsartan   1 tablet Oral BID   spironolactone   25 mg Oral Daily   tamsulosin   0.4 mg Oral Daily   [START ON 05/14/2024] torsemide   40 mg Oral Once per day on Monday Wednesday Friday   [START ON 05/13/2024] torsemide   60 mg Oral Once per day on Sunday Tuesday Thursday Saturday   Continuous Infusions:   LOS: 2 days    Time spent: 35 mins    Duane Elster  Maree, MD [facility] Pager [phone]  If 7PM-7AM, please contact night-coverage [email or web address]  05/12/2024, 4:09 PM

## 2024-05-12 NOTE — Consult Note (Signed)
 PHARMACY CONSULT NOTE - ELECTROLYTES  Pharmacy Consult for Electrolyte Monitoring and Replacement   Recent Labs: Height: 5' 8 (172.7 cm) Weight: 94.5 kg (208 lb 5.4 oz) IBW/kg (Calculated) : 68.4 Estimated Creatinine Clearance: 30.9 mL/min (A) (by C-G formula based on SCr of 1.91 mg/dL (H)). Potassium (mmol/L)  Date Value  05/12/2024 4.1  01/06/2014 3.0 (L)   Magnesium  (mg/dL)  Date Value  92/94/7974 1.8  01/06/2014 0.9 (L)   Calcium  (mg/dL)  Date Value  92/94/7974 8.0 (L)   Calcium , Total (mg/dL)  Date Value  96/98/7984 7.7 (L)   Albumin  (g/dL)  Date Value  92/97/7974 3.8  01/06/2014 3.5   Phosphorus (mg/dL)  Date Value  92/95/7974 3.1   Sodium (mmol/L)  Date Value  05/12/2024 140  08/09/2023 143  01/06/2014 142    Assessment  [name] is a 89 y.o. male presenting with CHF exacerbation. PMH significant for PAF on Eliquis , CAD, HTN, CHF, and CKD. Pharmacy has been consulted to monitor and replace electrolytes.  Diet: heart healthy/carb modified diet MIVF: N/A Pertinent medications:  -Potassium 40 mEq po daily,  -furosemide  60 mg IV BID,  -spironolactone  25mg  daily, - Entresto  49/51 mg bid  Goal of Therapy: Electrolytes WNL Afib: goal K >4.0,  Mag >2.0  Plan:  Mag 1.8   Will order Magnesium  sulfate 2 g IV x 1  F/u with AM labs.   Thank you for allowing pharmacy to be a part of this patient's care.  Suzann Allean LABOR, PharmD Clinical Pharmacist 05/12/2024 7:58 AM

## 2024-05-12 NOTE — Evaluation (Signed)
 Physical Therapy Evaluation Patient Details Name: [name] MRN: [id number] DOB: 1935/04/02 Today's Date: 05/12/2024  History of Present Illness  Pt is a 89 y.o. male admitted with CHF exacerbation. PMH significant for PAF on Eliquis , CAD s/p CAD status post CABG x5 and multiple stents, ischemic cardiomyopathy (EF 25-30% 12/2023), pacemaker, CHF, back sx, and CKD.  Clinical Impression  Patient received on the toilet with RN. Patient agreeable to PT evaluation. Prior to hospitalization, patient was Mod I with mobility and ADLs (with occasional help from family) using RW, but has had 2 falls in the last 6 months. He lives with family who is present 24/7. Upon PT evaluation, patient was I with bed mobility, supervision for transfers with RW, and ambulated 200 feet with RW and CGA. He needed cuing for hand placement during tranfers and for more upright posture during ambulation. He appears to have experienced a decrease in functional mobility/independence and would benefit from continued PT after acute care hospital discharge to return to prior level of function and reduced fall risk. Patient would benefit from skilled physical therapy to address impairments and functional limitations (see PT Problem List below) to work towards stated goals and return to PLOF or maximal functional independence.          If plan is discharge home, recommend the following: A little help with walking and/or transfers;A little help with bathing/dressing/bathroom;Assistance with cooking/housework;Assist for transportation;Help with stairs or ramp for entrance   Can travel by private vehicle    yes    Equipment Recommendations BSC/3in1  Recommendations for Other Services       Functional Status Assessment Patient has had a recent decline in their functional status and demonstrates the ability to make significant improvements in function in a reasonable and predictable amount of time.     Precautions / Restrictions  Precautions Precautions: ICD/Pacemaker;Fall Restrictions Weight Bearing Restrictions Per Provider Order: No      Mobility  Bed Mobility Overal bed mobility: Independent             General bed mobility comments: supine <> sit on flat bed    Transfers   Equipment used: Rolling walker (2 wheels) Transfers: Sit to/from Stand Sit to Stand: Supervision           General transfer comment: Patient transfered toilet to bed and bed tochair with RW and supervision. Heavy UE support on grab bars at toilet. needed cuing for hand placement when standing up from bed    Ambulation/Gait Ambulation/Gait assistance: Contact guard assist Gait Distance (Feet): 200 Feet Assistive device: Rolling walker (2 wheels) Gait Pattern/deviations: Trunk flexed, Step-through pattern Gait velocity: slow     General Gait Details: patient ambulated around nursing station with RW and CGA. He demo increased work of breathing and stooped posture that he was unable to correct with cuing.  Stairs            Wheelchair Mobility     Tilt Bed    Modified Rankin (Stroke Patients Only)       Balance Overall balance assessment: History of Falls, Needs assistance Sitting-balance support: No upper extremity supported, Feet supported Sitting balance-[name]: Good Sitting balance - Comments: steady sitting at edge of bed, edge of chair, and on toilet   Standing balance support: During functional activity, Bilateral upper extremity supported Standing balance-[name]: Good Standing balance comment: dependent on RW during ambulation  Pertinent Vitals/Pain Pain Assessment Pain Assessment: No/denies pain    Home Living Family/patient expects to be discharged to:: Private residence Living Arrangements: Spouse/significant other Available Help at Discharge: Family Type of Home: House Home Access: Ramped entrance       Home Layout: One  level Home Equipment: Cane - single Librarian, academic (2 wheels);Shower seat;Grab bars - toilet;Grab bars - tub/shower      Prior Function Prior Level of Function : Independent/Modified Independent             Mobility Comments: Mod I using RW, 2 falls in the past 6 months ADLs Comments: MOD I, family assist PRN, drives     Extremity/Trunk Assessment   Upper Extremity Assessment Upper Extremity Assessment: Defer to OT evaluation    Lower Extremity Assessment Lower Extremity Assessment: Overall WFL for tasks assessed    Cervical / Trunk Assessment Cervical / Trunk Assessment: Kyphotic (stooped with difficulty standing up straight in standing)  Communication   Communication Communication: Impaired Factors Affecting Communication: Reduced clarity of speech    Cognition Arousal: Alert Behavior During Therapy: WFL for tasks assessed/performed                             Following commands: Intact       Cueing Cueing Techniques: Verbal cues     General Comments General comments (skin integrity, edema, etc.): HR and Sp02 WNL during mobility    Exercises Other Exercises Other Exercises: educated pateint on safe transfer techniques   Assessment/Plan    PT Assessment Patient needs continued PT services  PT Problem List Decreased strength;Decreased mobility;Decreased activity tolerance;Cardiopulmonary status limiting activity;Decreased balance;Decreased knowledge of use of DME       PT Treatment Interventions DME instruction;Gait training;Stair training;Functional mobility training;Therapeutic activities;Patient/family education;Neuromuscular re-education;Balance training;Therapeutic exercise    PT Goals (Current goals can be found in the Care Plan section)  Acute Rehab PT Goals Patient Stated Goal: to get better and go home PT Goal Formulation: With patient Time For Goal Achievement: 05/26/24 Potential to Achieve Goals: Good    Frequency Min  1X/week     Co-evaluation               AM-PAC PT 6 Clicks Mobility  Outcome Measure Help needed turning from your back to your side while in a flat bed without using bedrails?: None Help needed moving from lying on your back to sitting on the side of a flat bed without using bedrails?: None Help needed moving to and from a bed to a chair (including a wheelchair)?: A Little Help needed standing up from a chair using your arms (e.g., wheelchair or bedside chair)?: A Little Help needed to walk in hospital room?: A Little Help needed climbing 3-5 steps with a railing? : A Lot 6 Click Score: 19    End of Session Equipment Utilized During Treatment: Gait belt Activity Tolerance: Patient tolerated treatment well Patient left: in chair;with call bell/phone within reach;with chair alarm set;with nursing/sitter in room Nurse Communication: Mobility status PT Visit Diagnosis: Unsteadiness on feet (R26.81);Difficulty in walking, not elsewhere classified (R26.2);History of falling (Z91.81)    Time: 8399-8382 PT Time Calculation (min) (ACUTE ONLY): 17 min   Charges:   PT Evaluation $PT Eval Low Complexity: 1 Low   PT General Charges $$ ACUTE PT VISIT: 1 Visit         Camie R. Juli, PT, DPT 05/12/24, 4:30 PM

## 2024-05-13 DIAGNOSIS — I25118 Atherosclerotic heart disease of native coronary artery with other forms of angina pectoris: Secondary | ICD-10-CM | POA: Diagnosis not present

## 2024-05-13 DIAGNOSIS — I5023 Acute on chronic systolic (congestive) heart failure: Secondary | ICD-10-CM | POA: Diagnosis not present

## 2024-05-13 DIAGNOSIS — E1169 Type 2 diabetes mellitus with other specified complication: Secondary | ICD-10-CM | POA: Diagnosis not present

## 2024-05-13 DIAGNOSIS — N184 Chronic kidney disease, stage 4 (severe): Secondary | ICD-10-CM | POA: Diagnosis not present

## 2024-05-13 DIAGNOSIS — I5043 Acute on chronic combined systolic (congestive) and diastolic (congestive) heart failure: Secondary | ICD-10-CM | POA: Diagnosis not present

## 2024-05-13 DIAGNOSIS — R7989 Other specified abnormal findings of blood chemistry: Secondary | ICD-10-CM | POA: Diagnosis not present

## 2024-05-13 DIAGNOSIS — N179 Acute kidney failure, unspecified: Secondary | ICD-10-CM | POA: Diagnosis not present

## 2024-05-13 LAB — RENAL FUNCTION PANEL
Albumin: 2.9 g/dL — ABNORMAL LOW (ref 3.5–5.0)
Anion gap: 8 (ref 5–15)
BUN: 40 mg/dL — ABNORMAL HIGH (ref 8–23)
CO2: 27 mmol/L (ref 22–32)
Calcium: 8 mg/dL — ABNORMAL LOW (ref 8.9–10.3)
Chloride: 102 mmol/L (ref 98–111)
Creatinine, Ser: 1.9 mg/dL — ABNORMAL HIGH (ref 0.61–1.24)
GFR, Estimated: 34 mL/min — ABNORMAL LOW (ref >60)
Glucose, Bld: 129 mg/dL — ABNORMAL HIGH (ref 70–99)
Phosphorus: 3.2 mg/dL (ref 2.5–4.6)
Potassium: 4.2 mmol/L (ref 3.5–5.1)
Sodium: 137 mmol/L (ref 135–145)

## 2024-05-13 LAB — GLUCOSE, CAPILLARY
Glucose-Capillary: 96 mg/dL (ref 70–99)
Glucose-Capillary: 143 mg/dL — ABNORMAL HIGH (ref 70–99)

## 2024-05-13 LAB — MAGNESIUM: Magnesium: 2.1 mg/dL (ref 1.7–2.4)

## 2024-05-13 MED ORDER — METOLAZONE 2.5 MG PO TABS: 2.5000 mg | ORAL_TABLET | ORAL | 0 refills | Status: DC

## 2024-05-13 MED ORDER — TORSEMIDE 40 MG PO TABS: 40.0000 mg | ORAL_TABLET | ORAL | 0 refills | Status: DC

## 2024-05-13 MED ORDER — TORSEMIDE 60 MG PO TABS: 60.0000 mg | ORAL_TABLET | ORAL | 0 refills | Status: DC

## 2024-05-13 MED ORDER — ORAL CARE MOUTH RINSE: 15.0000 mL | OROMUCOSAL | Status: DC | PRN

## 2024-05-13 NOTE — Discharge Summary (Signed)
 Physician Discharge Summary   Patient: [name] MRN: [id number] DOB: 1935-05-26  Admit date:     05/09/2024  Discharge date: 05/13/2024  Discharge Physician: Cresencio Fairly   PCP: [name], MD   Recommendations at discharge:   Follow-up with outpatient providers as requested  Discharge Diagnoses: Principal Problem:   CHF exacerbation (HCC) Active Problems:   HTN (hypertension)   Acute on chronic HFrEF (heart failure with reduced ejection fraction) (HCC)   Acute renal failure superimposed on stage 3b chronic kidney disease (HCC)   CAD, multiple vessel   CAD (coronary artery disease)   Type 2 diabetes mellitus with hyperlipidemia (HCC)   Elevated troponin   Paroxysmal atrial fibrillation (HCC)   S/P CABG x 5  Hospital Course: Assessment and Plan:  89 y.o. 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  Replating electrolytes, ongoing diuresis 7/5: ongoing Diuresis   Acute on chronic HFrEF (heart failure with reduced ejection fraction) (HCC) Diuresed well while in the hospital Net IO Since Admission: -3,766.73 mL [05/13/24 1736]  EF 25 to 30% on echo 12/2023 Cardiology recommends - torsemide  to alternating days of 60 mg-40 mg doses and increasing metolazone  to twice weekly.  He has been instructed to contact cardiology office with worsening edema, shortness of breath, or weight gain.  - Follow-up appt with Dr. Rolan in the heart failure clinic on 7/11  - Take metolazone  twice weekly (on 60 mg torsemide   days) -continue carvedilol , farxiga , hydralazine , isosorbide , entresto , spironolactone .   Elevated troponin with chest pressure CAD s/p CABG, multiple vessel CAD on LHC 05/2023 -Complains of chest pressure likely related to her increased work of breathing.  Likely due to demand ischemia -History of right and left heart cath 05/2023 showing Significant underlying three-vessel coronary artery disease with patent grafts including LIMA to LAD, SVG to diagonal, sequential SVG to left circumflex and SVG to right PDA. Stable moderate stenosis in proximal SVG to right PDA  -Continue Imdur , Crestor , carvedilol  and apixaban    Acute renal failure superimposed on stage 3b chronic kidney disease (HCC) Improved.  At baseline   Thrombocytopenia  Stable   History of hydrocele/varicocele Followed by urology as outpt    Pacemaker in place No acute issues suspected   Paroxysmal atrial fibrillation (HCC) Continue Coreg  and apixaban    Hypokalemia/hypomagnesemia Repleted      Consultants: Cardiology Disposition: Home health Diet recommendation:  Discharge Diet Orders (From admission, onward)     Start     Ordered   05/13/24 0000  Diet - low sodium heart healthy        05/13/24 1230           Carb modified diet DISCHARGE MEDICATION: Allergies as of 05/13/2024       Reactions   Angiotensin Receptor Blockers    hyperkalemia   Spironolactone     Hyperkalemia Pt states he is not allergic   Metformin Diarrhea        Medication List     STOP taking these medications    aspirin  EC 81 MG tablet  Insulin  Degludec-Liraglutide 100-3.6 UNIT-MG/ML Sopn Commonly known as: XULTOPHY       TAKE these medications    acetaminophen  500 MG tablet Commonly known as: TYLENOL  Take 1,000 mg by mouth every 6 (six) hours as needed for mild pain, fever or headache.   acyclovir  800 MG tablet Commonly known as: ZOVIRAX  Take 800 mg by mouth daily.   apixaban  2.5 MG Tabs tablet Commonly  known as: Eliquis  Take 1 tablet (2.5 mg total) by mouth 2 (two) times daily.   Basaglar  KwikPen 100 UNIT/ML Inject 15 Units into the skin at bedtime.   carvedilol  6.25 MG tablet Commonly known as: COREG  TAKE 1 TABLET BY MOUTH 2 TIMES DAILY WITH A MEAL.   dapagliflozin  propanediol 10 MG Tabs tablet Commonly known as: Farxiga  Take 1 tablet (10 mg total) by mouth daily.   Entresto  49-51 MG Generic drug: sacubitril -valsartan  Take 1 tablet by mouth 2 (two) times daily.   ezetimibe  10 MG tablet Commonly known as: ZETIA  TAKE 1 TABLET BY MOUTH EVERY DAY   hydrALAZINE  25 MG tablet Commonly known as: APRESOLINE  Take 25 mg by mouth 2 (two) times daily.   isosorbide  mononitrate 60 MG 24 hr tablet Commonly known as: IMDUR  Take 60 mg by mouth daily.   magnesium  oxide 400 MG tablet Commonly known as: MAG-OX Take 1 tablet (400 mg total) by mouth daily.   metolazone  2.5 MG tablet Commonly known as: ZAROXOLYN  Take 1 tablet (2.5 mg total) by mouth every 7 (seven) days. Start taking on: May 15, 2024 What changed: additional instructions   metolazone  2.5 MG tablet Commonly known as: ZAROXOLYN  Take 1 tablet (2.5 mg total) by mouth every Saturday. Start taking on: May 19, 2024 What changed: You were already taking a medication with the same name, and this prescription was added. Make sure you understand how and when to take each.   pantoprazole  40 MG tablet Commonly known as: PROTONIX  Take 40 mg by mouth daily.   polyethylene glycol 17 g packet Commonly known as: MIRALAX  / GLYCOLAX  Take 17 g by mouth daily as needed for moderate constipation.   potassium chloride  SA 20 MEQ tablet Commonly known as: KLOR-CON  M Take 2 tablets (40 mEq total) by mouth daily.   Repatha SureClick 140 MG/ML Soaj Generic drug: Evolocumab Inject 140 mg into the skin. What changed: Another medication with the same name was removed. Continue taking this medication, and follow the directions you see  here.   rosuvastatin  40 MG tablet Commonly known as: CRESTOR  Take 1 tablet (40 mg total) by mouth daily.   spironolactone  25 MG tablet Commonly known as: ALDACTONE  Take 1 tablet (25 mg total) by mouth daily.   tamsulosin  0.4 MG Caps capsule Commonly known as: FLOMAX  Take 0.4 mg by mouth daily.   Torsemide  40 MG Tabs Take 40 mg by mouth 3 (three) times a week. Start taking on: May 14, 2024 What changed:  medication strength how much to take when to take this   Torsemide  60 MG Tabs Take 60 mg by mouth 4 (four) times a week. Start taking on: May 15, 2024 What changed: You were already taking a medication with the same name, and this prescription was added. Make sure you understand how and when to take each.        Follow-up Information     [facility]. Go on 05/18/2024.   Specialty: Cardiology Why: Has a previous scheduled 4 week f/u at the [facility] 05/18/24 @  2:[address]  [facility] [employer], [address], Second Floor Free Valet Parking at the door Contact information: [address] [place]  [address] [phone]        [name], MD. Schedule an appointment as soon as possible for a visit in 1 week(s).   Specialty: Internal Medicine Why: [facility] Discharge F/UP Contact information: [address] [place] [address] [phone]         [facility]. Schedule an appointment as soon as possible for a visit in 1 week(s).   Specialty: Cardiology Why: [facility] Discharge F/UP Contact information: [address]  [place]  [address] [phone]        Rolan Ezra RAMAN, MD. Go on 05/18/2024.   Specialty: Cardiology Why: As scheduled Contact information: [address] [place] [address] [phone]         [place]  Follow up.   Specialty: Home Health Services Why: They will contcat you to schedule the first PT/OT home visit. Contact information: [address] [place] [address] [phone]                Discharge Exam: Filed Weights   05/11/24 0500 05/12/24 0500 05/13/24 0500  Weight: 93.7 kg 94.5 kg 94.1 kg   GEN: No acute distress.   Neck: No JVD sitting upright Cardiac: RRR, no murmurs, rubs, or gallops.  Respiratory: Clear to auscultation bilaterally. GI: Soft, benign MS: Trace to 1+ bilateral firm ankle edema; No deformity. Neuro:  Nonfocal  Psych: Normal affect   Condition at discharge: fair  The results of significant diagnostics from this hospitalization (including imaging, microbiology, ancillary and laboratory) are listed below for reference.   Imaging Studies: DG Chest Portable 1 View Result Date: 05/09/2024 CLINICAL DATA:  Shortness of breath. EXAM: PORTABLE CHEST 1 VIEW COMPARISON:  Chest radiograph 03/24/2024, CT 12/15/2022 FINDINGS: Left-sided pacemaker in place. Prior median sternotomy. Previous left atrial clipping cardiomegaly stable. Mediastinal contours are unchanged. No pulmonary edema. No focal airspace disease. No pleural fluid or pneumothorax. Irregular densities projecting over the right mid lung correspond to calcifications chest wall on prior CT. IMPRESSION: 1. No acute chest findings. 2. Stable cardiomegaly. Electronically Signed   By: Andrea Gasman M.D.   On: 05/09/2024 21:46    Microbiology: Results for orders placed or performed during the hospital encounter of 03/24/24  Urine Culture     Status: Abnormal   Collection Time: 03/24/24 12:35 AM   Specimen: Urine, Clean Catch  Result Value Ref Range Status   Specimen Description   Final    URINE, CLEAN CATCH Performed at [facility], [address], [place], [place] [address]    Special Requests   Final    NONE Performed at [facility], [address]  [place], [place] [address]    Culture (A)  Final    >=100,000 COLONIES/mL PROTEUS MIRABILIS >=100,000 COLONIES/mL STAPHYLOCOCCUS HAEMOLYTICUS    Report Status 03/27/2024 FINAL  Final   Organism ID, Bacteria PROTEUS MIRABILIS (A)  Final   Organism ID, Bacteria STAPHYLOCOCCUS HAEMOLYTICUS (A)  Final      Susceptibility   Proteus mirabilis - MIC*    AMPICILLIN  <=2 SENSITIVE Sensitive     CEFAZOLIN  <=4 SENSITIVE Sensitive     CEFEPIME <=0.12 SENSITIVE Sensitive     CEFTRIAXONE  <=0.25 SENSITIVE Sensitive     CIPROFLOXACIN  <=0.25  SENSITIVE Sensitive     GENTAMICIN  <=1 SENSITIVE Sensitive     IMIPENEM 1 SENSITIVE Sensitive     NITROFURANTOIN RESISTANT Resistant     TRIMETH/SULFA <=20 SENSITIVE Sensitive     AMPICILLIN /SULBACTAM <=2 SENSITIVE Sensitive     PIP/TAZO <=4 SENSITIVE Sensitive ug/mL    * >=100,000 COLONIES/mL PROTEUS MIRABILIS   Staphylococcus haemolyticus - MIC*    CIPROFLOXACIN  <=0.5 SENSITIVE Sensitive     GENTAMICIN  <=0.5 SENSITIVE Sensitive     NITROFURANTOIN <=16 SENSITIVE Sensitive     OXACILLIN 0.5 RESISTANT Resistant     TETRACYCLINE >=16 RESISTANT Resistant     VANCOMYCIN  <=0.5 SENSITIVE Sensitive     TRIMETH/SULFA <=10 SENSITIVE Sensitive     RIFAMPIN <=0.5 SENSITIVE Sensitive     Inducible Clindamycin NEGATIVE Sensitive     * >=100,000 COLONIES/mL STAPHYLOCOCCUS HAEMOLYTICUS    Labs: CBC: Recent Labs  Lab 05/09/24 2113 05/10/24 0433 05/11/24 0421 05/12/24 0353  WBC 4.6 4.6 4.5 5.7  NEUTROABS 2.5  --   --   --   HGB 13.7 13.3 11.9* 12.0*  HCT 44.8 42.4 37.4* 37.9*  MCV 93.1 92.6 89.7 91.3  PLT 114* 113* 107* 116*   Basic Metabolic Panel: Recent Labs  Lab 05/09/24 2113 05/10/24 0433 05/11/24 0421 05/12/24 0353 05/13/24 0305  NA 144 143 143 140 137  K 3.6 3.4* 3.3* 4.1 4.2  CL 106 107 108 105 102  CO2 22 27 25 26 27   GLUCOSE 144* 132* 70 138* 129*  BUN 46* 44* 40* 39* 40*  CREATININE 2.38* 2.23* 1.89* 1.91* 1.90*  CALCIUM  8.4* 8.2* 8.0* 8.0*  8.0*  MG  --   --  1.6* 1.8 2.1  PHOS  --   --  3.1  --  3.2   Liver Function Tests: Recent Labs  Lab 05/09/24 2113 05/13/24 0305  AST 21  --   ALT 13  --   ALKPHOS 72  --   BILITOT 1.1  --   PROT 7.9  --   ALBUMIN  3.8 2.9*   CBG: Recent Labs  Lab 05/12/24 1152 05/12/24 1630 05/12/24 2049 05/13/24 0805 05/13/24 1157  GLUCAP 118* 150* 137* 96 143*    Discharge time spent: greater than 30 minutes.  Signed: Cresencio Fairly, MD [facility] 05/13/2024

## 2024-05-13 NOTE — Progress Notes (Signed)
 Pt discharged to home via wheelchair. Discharge instructions and medication changes discussed with family. PIV removed. All questions and concerns answered.

## 2024-05-13 NOTE — Progress Notes (Signed)
 Rounding Note    Patient Name: [name] Date of Encounter: 05/13/2024  [facility] Cardiologist: Deatrice Cage, MD   Subjective   Feels great. Good urine output on oral torsemide  this AM. Wants to go home. Reviewed med changes.  Inpatient Medications    Scheduled Meds:  apixaban   2.5 mg Oral BID   carvedilol   6.25 mg Oral BID WC   dapagliflozin  propanediol  10 mg Oral Daily   ezetimibe   10 mg Oral Daily   hydrALAZINE   25 mg Oral BID   insulin  aspart  0-15 Units Subcutaneous TID WC   insulin  aspart  0-5 Units Subcutaneous QHS   insulin  glargine-yfgn  5 Units Subcutaneous QHS   isosorbide  mononitrate  60 mg Oral Daily   [START ON 05/15/2024] metolazone   2.5 mg Oral Q7 days   metolazone   2.5 mg Oral Q Sat   potassium chloride  SA  40 mEq Oral Daily   rosuvastatin   40 mg Oral Daily   sacubitril -valsartan   1 tablet Oral BID   spironolactone   25 mg Oral Daily   tamsulosin   0.4 mg Oral Daily   [START ON 05/14/2024] torsemide   40 mg Oral Once per day on Monday Wednesday Friday   torsemide   60 mg Oral Once per day on Sunday Tuesday Thursday Saturday   Continuous Infusions:  PRN Meds: acetaminophen  **OR** acetaminophen , albuterol , HYDROcodone -acetaminophen , ondansetron  **OR** ondansetron  (ZOFRAN ) IV, mouth rinse, simethicone    Vital Signs    Vitals:   05/13/24 0500 05/13/24 0808 05/13/24 0819 05/13/24 1158  BP:  119/72 119/72 104/66  Pulse:  73  70  Resp:  16  15  Temp:  97.9 F (36.6 C)  98.4 F (36.9 C)  TempSrc:  Oral  Oral  SpO2:  98%  99%  Weight: 94.1 kg     Height:        Intake/Output Summary (Last 24 hours) at 05/13/2024 1212 Last data filed at 05/13/2024 0500 Gross per 24 hour  Intake 1560 ml  Output 1825 ml  Net -265 ml      05/13/2024    5:00 AM 05/12/2024    5:00 AM 05/11/2024    5:00 AM  Last 3 Weights  Weight (lbs) 207 lb 7.3 oz 208 lb 5.4 oz 206 lb 9.1 oz  Weight (kg) 94.1 kg 94.5 kg 93.7 kg      Telemetry    AV paced rhythm, 4  beats NSVT - Personally Reviewed  Physical Exam   GEN: No acute distress.   Neck: No JVD sitting upright Cardiac: RRR, no murmurs, rubs, or gallops.  Respiratory: Clear to auscultation bilaterally. GI: Less firm than yesterday, nontender MS: Trace to 1+ bilateral firm ankle edema; No deformity. Neuro:  Nonfocal  Psych: Normal affect   New pertinent results (labs, ECG, imaging, cardiac studies)     Assessment & Plan    Acute on chronic systolic and diastolic heart failure, ischemic cardiomyopathy: Doing well on oral this AM, ok for discharge -transitioned to 60 mg torsemide  daily alternating with 40 mg torsemide  daily  -changed to metolazone  twice weekly (on 60 mg torsemide  days) -continue carvedilol , farxiga , hydralazine , isosorbide , entresto , spironolactone .   CAD, hyperlipidemia, elevated troponins: consistent with heart failure, not ACS. Continue rosuvastatin , zetia , repatha for CAD/lipids and imdur , carvedilol  for angina. He is on both aspirin  and apixaban . Given age, no recent interventions (last cath 05/2023), favor apixaban  alone, aspirin  stopped   Paroxysmal atrial fibrillation: continue apixaban  2.5 mg BID given age, renal function. Paced  rhythm   Chronic kidney disease, stage 4: worse on presentation, consistent with cardiorenal syndrome. Improved with diuresis. Cr 1.90 today  Ok for discharge, discussed with primary team. Changes this admission include increasing torsemide  to alternating days of 60 mg-40 mg doses and increasing metolazone  to twice weekly. Instructed to contact office with worsening edema, shortness of breath, or weight gain. Has appt with Dr. Rolan in the heart failure clinic on 7/11.    Signed, Shelda Bruckner, MD  05/13/2024, 12:12 PM

## 2024-05-13 NOTE — Plan of Care (Signed)

## 2024-05-13 NOTE — Consult Note (Signed)
 PHARMACY CONSULT NOTE - ELECTROLYTES  Pharmacy Consult for Electrolyte Monitoring and Replacement   Recent Labs: Height: 5' 8 (172.7 cm) Weight: 94.1 kg (207 lb 7.3 oz) IBW/kg (Calculated) : 68.4 Estimated Creatinine Clearance: 31.1 mL/min (A) (by C-G formula based on SCr of 1.9 mg/dL (H)). Potassium (mmol/L)  Date Value  05/13/2024 4.2  01/06/2014 3.0 (L)   Magnesium  (mg/dL)  Date Value  92/93/7974 2.1  01/06/2014 0.9 (L)   Calcium  (mg/dL)  Date Value  92/93/7974 8.0 (L)   Calcium , Total (mg/dL)  Date Value  96/98/7984 7.7 (L)   Albumin  (g/dL)  Date Value  92/93/7974 2.9 (L)  01/06/2014 3.5   Phosphorus (mg/dL)  Date Value  92/93/7974 3.2   Sodium (mmol/L)  Date Value  05/13/2024 137  08/09/2023 143  01/06/2014 142    Assessment  [name] is a 89 y.o. male presenting with CHF exacerbation. PMH significant for PAF on Eliquis , CAD, HTN, CHF, and CKD. Pharmacy has been consulted to monitor and replace electrolytes.  Diet: heart healthy/carb modified diet MIVF: N/A Pertinent medications:  -Potassium 40 mEq po daily,  -furosemide  60 mg IV BID >>  Torsemide  60mg  TTSS, 40mg  MWF -Metolazone  2x/week (on 60 mg torsemide  days)  -spironolactone  25mg  daily, - Entresto  49/51 mg bid  Goal of Therapy: Electrolytes WNL Afib: goal K >4.0,  Mag >2.0  Plan:  No electrolyte replacement at this time F/u with AM labs.   Thank you for allowing pharmacy to be a part of this patient's care.  Suzann Allean LABOR, PharmD Clinical Pharmacist 05/13/2024 7:47 AM

## 2024-05-13 NOTE — TOC Progression Note (Signed)
 Transition of Care [facility]) - Progression Note    Patient Details  Name: [name] MRN: [id number] Date of Birth: 04-Dec-1934  [facility] CM/SW Contact  Lorraine LILLETTE [place], [place] Phone Number: 05/13/2024, 12:51 PM  Clinical Narrative:    Pt clear for DC, Added [facility] to AVS and sent message advising the agency of pt DC today.  No further TOC needs.    Expected Discharge Plan: Home w Home Health Services Barriers to Discharge: No Barriers Identified  Expected Discharge Plan and Services In-house Referral: Clinical Social Work     Living arrangements for the past 2 months: Single Family Home Expected Discharge Date: 05/13/24                                     Social Determinants of Health (SDOH) Interventions SDOH Screenings   Food Insecurity: No Food Insecurity (05/10/2024)  Housing: Low Risk  (05/10/2024)  Transportation Needs: No Transportation Needs (05/10/2024)  Utilities: Not At Risk (05/10/2024)  Depression (PHQ2-9): Low Risk  (04/07/2022)  Financial Resource Strain: Low Risk  (06/29/2021)   Received from [facility]  Physical Activity: Inactive (07/27/2019)  Social Connections: Moderately Integrated (05/10/2024)  Stress: No Stress Concern Present (07/27/2019)  Tobacco Use: Medium Risk (05/09/2024)    Readmission Risk Interventions    03/26/2024    4:09 PM 03/10/2023   12:47 PM 03/29/2022    1:18 PM  Readmission Risk Prevention Plan  Transportation Screening Complete Complete Complete  PCP or Specialist Appt within 3-5 Days  Complete Complete  HRI or Home Care Consult   --  Social Work Consult for Recovery Care Planning/Counseling  Complete --  Palliative Care Screening  Not Applicable Not Applicable  Medication Review Ocean[employer]) Complete Complete Complete  PCP or Specialist appointment within 3-5 days of discharge Complete    HRI or Home Care Consult Complete    SW Recovery Care/Counseling Consult Complete    Palliative Care Screening Not  Applicable    Skilled Nursing Facility Not Applicable

## 2024-05-17 ENCOUNTER — Telehealth: Payer: Self-pay | Admitting: Cardiology

## 2024-05-17 NOTE — Telephone Encounter (Signed)
 Called to confirm/remind patient of their appointment at the [facility] on 05/18/24.   Appointment:   [x] Confirmed  [] Left mess   [] No answer/No voice mail  [] VM Full/unable to leave message  [] Phone not in service  Patient reminded to bring all medications and/or complete list.  Confirmed patient has transportation. Gave directions, instructed to utilize valet parking.

## 2024-05-18 ENCOUNTER — Other Ambulatory Visit
Admission: RE | Admit: 2024-05-18 | Discharge: 2024-05-18 | Disposition: A | Discharge status: Home / Self Care | Source: Ambulatory Visit | Attending: Cardiology | Admitting: Cardiology

## 2024-05-18 ENCOUNTER — Ambulatory Visit (HOSPITAL_BASED_OUTPATIENT_CLINIC_OR_DEPARTMENT_OTHER): Admitting: Cardiology

## 2024-05-18 VITALS — BP 114/52 | HR 71 | Wt 208.0 lb

## 2024-05-18 DIAGNOSIS — Z95810 Presence of automatic (implantable) cardiac defibrillator: Secondary | ICD-10-CM | POA: Diagnosis not present

## 2024-05-18 DIAGNOSIS — Z951 Presence of aortocoronary bypass graft: Secondary | ICD-10-CM | POA: Diagnosis not present

## 2024-05-18 DIAGNOSIS — Z7901 Long term (current) use of anticoagulants: Secondary | ICD-10-CM | POA: Diagnosis not present

## 2024-05-18 DIAGNOSIS — Z794 Long term (current) use of insulin: Secondary | ICD-10-CM | POA: Diagnosis not present

## 2024-05-18 DIAGNOSIS — Z87891 Personal history of nicotine dependence: Secondary | ICD-10-CM | POA: Diagnosis not present

## 2024-05-18 DIAGNOSIS — Z7984 Long term (current) use of oral hypoglycemic drugs: Secondary | ICD-10-CM | POA: Diagnosis not present

## 2024-05-18 DIAGNOSIS — I480 Paroxysmal atrial fibrillation: Secondary | ICD-10-CM | POA: Insufficient documentation

## 2024-05-18 DIAGNOSIS — I252 Old myocardial infarction: Secondary | ICD-10-CM | POA: Insufficient documentation

## 2024-05-18 DIAGNOSIS — I255 Ischemic cardiomyopathy: Secondary | ICD-10-CM | POA: Insufficient documentation

## 2024-05-18 DIAGNOSIS — N183 Chronic kidney disease, stage 3 unspecified: Secondary | ICD-10-CM | POA: Insufficient documentation

## 2024-05-18 DIAGNOSIS — I5022 Chronic systolic (congestive) heart failure: Secondary | ICD-10-CM | POA: Diagnosis present

## 2024-05-18 DIAGNOSIS — I2510 Atherosclerotic heart disease of native coronary artery without angina pectoris: Secondary | ICD-10-CM | POA: Diagnosis not present

## 2024-05-18 DIAGNOSIS — Z79899 Other long term (current) drug therapy: Secondary | ICD-10-CM | POA: Diagnosis not present

## 2024-05-18 DIAGNOSIS — E1122 Type 2 diabetes mellitus with diabetic chronic kidney disease: Secondary | ICD-10-CM | POA: Insufficient documentation

## 2024-05-18 DIAGNOSIS — I130 Hypertensive heart and chronic kidney disease with heart failure and stage 1 through stage 4 chronic kidney disease, or unspecified chronic kidney disease: Secondary | ICD-10-CM | POA: Insufficient documentation

## 2024-05-18 LAB — BASIC METABOLIC PANEL WITH GFR
Anion gap: 13 (ref 5–15)
BUN: 23 mg/dL (ref 8–23)
CO2: 27 mmol/L (ref 22–32)
Calcium: 9.2 mg/dL (ref 8.9–10.3)
Chloride: 99 mmol/L (ref 98–111)
Creatinine, Ser: 1.58 mg/dL — ABNORMAL HIGH (ref 0.61–1.24)
GFR, Estimated: 42 mL/min — ABNORMAL LOW (ref >60)
Glucose, Bld: 108 mg/dL — ABNORMAL HIGH (ref 70–99)
Potassium: 3.6 mmol/L (ref 3.5–5.1)
Sodium: 139 mmol/L (ref 135–145)

## 2024-05-18 LAB — BRAIN NATRIURETIC PEPTIDE: B Natriuretic Peptide: 53.2 pg/mL (ref 0.0–100.0)

## 2024-05-18 MED ORDER — TORSEMIDE 60 MG PO TABS: 60.0000 mg | ORAL_TABLET | Freq: Every day | ORAL | 1 refills | Status: DC

## 2024-05-18 NOTE — Patient Instructions (Signed)
 Medication Changes:  INCREASE Torsemide  to 60mg  (3 tabs) daily  Lab Work:  Go over to the [facility]. Go pass the gift shop and have your blood work completed TODAY.  We will only call you if the results are abnormal or if the provider would like to make medication changes.   Follow-Up in: Please follow up with the [facility] in 2 weeks with Ellouise Class, FNP.  At the [facility], you and your health needs are our priority. We have a designated team specialized in the treatment of Heart Failure. This Care Team includes your primary Heart Failure Specialized Cardiologist (physician), Advanced Practice Providers (APPs- Physician Assistants and Nurse Practitioners), and Pharmacist who all work together to provide you with the care you need, when you need it.   You may see any of the following providers on your designated Care Team at your next follow up:  Dr. Toribio Fuel Dr. Ezra Shuck Dr. Ria Commander Dr. Odis Brownie Ellouise Class, FNP Jaun Bash, RPH-CPP  Please be sure to bring in all your medications bottles to every appointment.   Need to Contact Us :  If you have any questions or concerns before your next appointment please send us  a message through [place] or call our office at [phone].    TO LEAVE A MESSAGE FOR THE NURSE SELECT OPTION 2, PLEASE LEAVE A MESSAGE INCLUDING: YOUR NAME DATE OF BIRTH CALL BACK NUMBER REASON FOR CALL**this is important as we prioritize the call backs  YOU WILL RECEIVE A CALL BACK THE SAME DAY AS LONG AS YOU CALL BEFORE 4:00 PM

## 2024-05-20 ENCOUNTER — Ambulatory Visit (HOSPITAL_COMMUNITY): Payer: Self-pay | Admitting: Cardiology
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
## 2024-05-29 ENCOUNTER — Telehealth: Payer: Self-pay | Admitting: Family

## 2024-05-29 NOTE — Telephone Encounter (Signed)
 Called to confirm/remind patient of their appointment at the [facility] on 05/30/24.   Appointment:   [x] Confirmed  [] Left mess   [] No answer/No voice mail  [] VM Full/unable to leave message  [] Phone not in service  Patient reminded to bring all medications and/or complete list.  Confirmed patient has transportation. Gave directions, instructed to utilize valet parking.
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
## 2024-05-30 ENCOUNTER — Ambulatory Visit: Attending: Family | Admitting: Family

## 2024-05-30 ENCOUNTER — Encounter: Payer: Self-pay | Admitting: Family

## 2024-05-30 VITALS — BP 108/61 | HR 69 | Wt 205.0 lb

## 2024-05-30 DIAGNOSIS — I130 Hypertensive heart and chronic kidney disease with heart failure and stage 1 through stage 4 chronic kidney disease, or unspecified chronic kidney disease: Secondary | ICD-10-CM | POA: Diagnosis not present

## 2024-05-30 DIAGNOSIS — Z7984 Long term (current) use of oral hypoglycemic drugs: Secondary | ICD-10-CM | POA: Insufficient documentation

## 2024-05-30 DIAGNOSIS — I252 Old myocardial infarction: Secondary | ICD-10-CM | POA: Diagnosis not present

## 2024-05-30 DIAGNOSIS — I2510 Atherosclerotic heart disease of native coronary artery without angina pectoris: Secondary | ICD-10-CM | POA: Insufficient documentation

## 2024-05-30 DIAGNOSIS — E1122 Type 2 diabetes mellitus with diabetic chronic kidney disease: Secondary | ICD-10-CM | POA: Insufficient documentation

## 2024-05-30 DIAGNOSIS — R0602 Shortness of breath: Secondary | ICD-10-CM | POA: Diagnosis present

## 2024-05-30 DIAGNOSIS — Z79899 Other long term (current) drug therapy: Secondary | ICD-10-CM | POA: Diagnosis not present

## 2024-05-30 DIAGNOSIS — I255 Ischemic cardiomyopathy: Secondary | ICD-10-CM | POA: Diagnosis not present

## 2024-05-30 DIAGNOSIS — R5383 Other fatigue: Secondary | ICD-10-CM | POA: Diagnosis not present

## 2024-05-30 DIAGNOSIS — Z951 Presence of aortocoronary bypass graft: Secondary | ICD-10-CM | POA: Insufficient documentation

## 2024-05-30 DIAGNOSIS — Z950 Presence of cardiac pacemaker: Secondary | ICD-10-CM | POA: Insufficient documentation

## 2024-05-30 DIAGNOSIS — N183 Chronic kidney disease, stage 3 unspecified: Secondary | ICD-10-CM | POA: Insufficient documentation

## 2024-05-30 DIAGNOSIS — Z794 Long term (current) use of insulin: Secondary | ICD-10-CM | POA: Insufficient documentation

## 2024-05-30 DIAGNOSIS — I480 Paroxysmal atrial fibrillation: Secondary | ICD-10-CM | POA: Insufficient documentation

## 2024-05-30 DIAGNOSIS — Z7901 Long term (current) use of anticoagulants: Secondary | ICD-10-CM | POA: Diagnosis not present

## 2024-05-30 DIAGNOSIS — Z87891 Personal history of nicotine dependence: Secondary | ICD-10-CM | POA: Diagnosis not present

## 2024-05-30 DIAGNOSIS — I5022 Chronic systolic (congestive) heart failure: Secondary | ICD-10-CM | POA: Insufficient documentation

## 2024-05-30 NOTE — Patient Instructions (Signed)
 Medication Changes:  No medication changes today!  Lab Work:  Go over to the [facility]. Go pass the gift shop and have your blood work completed.  We will only call you if the results are abnormal or if the provider would like to make medication changes.   Follow-Up in: Please follow up with the [facility] in 2 months with Ellouise Class, FNP.  At the [facility], you and your health needs are our priority. We have a designated team specialized in the treatment of Heart Failure. This Care Team includes your primary Heart Failure Specialized Cardiologist (physician), Advanced Practice Providers (APPs- Physician Assistants and Nurse Practitioners), and Pharmacist who all work together to provide you with the care you need, when you need it.   You may see any of the following providers on your designated Care Team at your next follow up:  Dr. Toribio Fuel Dr. Ezra Shuck Dr. Ria Commander Dr. Odis Brownie Ellouise Class, FNP Jaun Bash, RPH-CPP  Please be sure to bring in all your medications bottles to every appointment.   Need to Contact Us :  If you have any questions or concerns before your next appointment please send us  a message through [place] or call our office at [phone].    TO LEAVE A MESSAGE FOR THE NURSE SELECT OPTION 2, PLEASE LEAVE A MESSAGE INCLUDING: YOUR NAME DATE OF BIRTH CALL BACK NUMBER REASON FOR CALL**this is important as we prioritize the call backs  YOU WILL RECEIVE A CALL BACK THE SAME DAY AS LONG AS YOU CALL BEFORE 4:00 PM

## 2024-06-05 NOTE — Progress Notes (Signed)
 Remote pacemaker transmission.

## 2024-06-22 ENCOUNTER — Ambulatory Visit (INDEPENDENT_AMBULATORY_CARE_PROVIDER_SITE_OTHER): Admitting: Podiatry

## 2024-06-22 DIAGNOSIS — E119 Type 2 diabetes mellitus without complications: Secondary | ICD-10-CM

## 2024-06-22 DIAGNOSIS — B351 Tinea unguium: Secondary | ICD-10-CM

## 2024-06-22 DIAGNOSIS — M2142 Flat foot [pes planus] (acquired), left foot: Secondary | ICD-10-CM

## 2024-06-22 DIAGNOSIS — M2141 Flat foot [pes planus] (acquired), right foot: Secondary | ICD-10-CM

## 2024-06-22 DIAGNOSIS — M79675 Pain in left toe(s): Secondary | ICD-10-CM | POA: Diagnosis not present

## 2024-06-22 DIAGNOSIS — E1151 Type 2 diabetes mellitus with diabetic peripheral angiopathy without gangrene: Secondary | ICD-10-CM

## 2024-06-22 DIAGNOSIS — M79674 Pain in right toe(s): Secondary | ICD-10-CM | POA: Diagnosis not present

## 2024-06-22 DIAGNOSIS — R600 Localized edema: Secondary | ICD-10-CM

## 2024-06-29 ENCOUNTER — Other Ambulatory Visit: Payer: Self-pay | Admitting: Cardiology

## 2024-06-29 ENCOUNTER — Encounter: Payer: Self-pay | Admitting: Podiatry

## 2024-06-29 DIAGNOSIS — I5022 Chronic systolic (congestive) heart failure: Secondary | ICD-10-CM

## 2024-06-29 NOTE — Progress Notes (Signed)
 ANNUAL DIABETIC FOOT EXAM  Subjective: Owyn Raulston presents today for annual diabetic foot exam. Chief Complaint  Patient presents with   Nail Problem    Thick painful toenails, 3 month follow up    Patient confirms h/o diabetes.  Patient denies any h/o foot wounds.  [name], MD is patient's PCP.  Past Medical History:  Diagnosis Date   AV block, [place] 1    CAD (coronary artery disease)    a. 08/2019 VF arrest-->sev 3vd on cath-->CABGx5 (LIMA->LAD, VG->OM->LCX, VG->RPDA, VG->Diag); b. 08/2021 Cath: sev native multivessel dzs w/ 5/5 patent grafts. Nl filling pressures->Med rx; c. 05/2023 Cath: stable anatomy->Med rx.   CKD (chronic kidney disease), stage III (HCC)    Diabetes mellitus without complication (HCC)    HFrEF (heart failure with reduced ejection fraction) (HCC)    a. 08/2020 Echo: EF 40-45%; b. 11/2020 Echo: EF 55%, Gr2 DD; c. 08/2021 Echo: EF 35-40%, glob HK w/ ant/antsept/apical HK; d. 10/2021 s/p CRT-P; e. 12/2021 Echo: EF 30-35%, glob HK, GrIII DD; f. 12/2022 Echo: EF 30-35%, glob HK, sev HK of ant wall, mod asymm LVH, nl RV fxn, mildly dil LA, triv MR, AoV sclerosis.   Hyperlipidemia LDL goal <70    Hypertension    Intermittent Complete heart block (HCC)    a. 08/2021 noted on Zio; b. 10/2021 s/p Abbott Allure RF CRT-P (Ser # [id number]).   Ischemic cardiomyopathy    a. 08/2020 Echo: EF 40-45%; b. 11/2020 Echo: EF 55%; c. 08/2021 Echo: EF 35-40%; d. 10/2021 s/p CRT-P; e. 12/2021 Echo: EF 30-35%, glob HK, GrIII DD.   PAF (paroxysmal atrial fibrillation) (HCC)    a.Medical device check-->Eliquis  (CHA2DS2VASc = 6)   Trifascicular block    Patient Active Problem List   Diagnosis Date Noted   Acute on chronic HFrEF (heart failure with reduced ejection fraction) (HCC) 03/24/2024   Scrotal edema 03/24/2024   Acute renal failure superimposed on stage 3b chronic kidney disease (HCC) 03/24/2024   Shortness of breath 12/24/2023   COVID-19 12/23/2023   CHF  exacerbation (HCC) 12/23/2023   ACS (acute coronary syndrome) (HCC) 06/03/2023   Chronic systolic CHF (congestive heart failure) (HCC) 06/03/2023   Cardiac resynchronization therapy pacemaker (CRT-P) in place 05/10/2023   Acute on chronic congestive heart failure (HCC) 03/09/2023   Paroxysmal A-fib (HCC) 03/09/2023   Hypomagnesemia 03/09/2023   Hypercoagulable state due to paroxysmal atrial fibrillation (HCC) 02/08/2023   Paroxysmal atrial fibrillation (HCC) 02/08/2023   Constipation 12/19/2022   Chest pain 12/19/2022   Hypertensive heart disease with heart failure (HCC) 12/18/2022   Thrombocytopenia (HCC) 12/17/2022   CKD stage 3b, GFR 30-44 ml/min (HCC) 12/17/2022   Obesity (BMI 30-39.9) 12/17/2022   Hypokalemia 12/17/2022   Ischemic cardiomyopathy 12/17/2022   Hypertensive urgency 03/29/2022   Dyslipidemia 03/29/2022   GERD without esophagitis 03/29/2022   Type 2 diabetes mellitus with hyperlipidemia (HCC) 03/29/2022   Elevated troponin 03/29/2022   Acute on chronic systolic CHF (congestive heart failure) (HCC) 01/04/2022   Dilated cardiomyopathy (HCC) 10/30/2021   Acute decompensated heart failure (HCC) 10/27/2021   Complete heart block (HCC) 10/12/2021   Junctional bradycardia    Acute on chronic diastolic CHF (congestive heart failure) (HCC) 08/26/2021   CAD (coronary artery disease)    HLD (hyperlipidemia)    GERD (gastroesophageal reflux disease)    Type II diabetes mellitus with renal manifestations (HCC)    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   CAD, multiple vessel 08/25/2020   Non-ST elevation (NSTEMI)  myocardial infarction (HCC) 08/21/2020   History of cardiac arrest 08/21/2020   HFrEF (heart failure with reduced ejection fraction) (HCC)    History of sustained ventricular fibrillation 08/18/2020   Acute respiratory failure with hypoxia (HCC)    Chronic diastolic heart failure (HCC)    Urinary tract infection 07/26/2019   HTN (hypertension) 07/26/2019   Diabetes (HCC)  07/26/2019   Unstable angina (HCC) 12/03/2008   Status post percutaneous transluminal coronary angioplasty 10/08/2008   Malignant neoplasm of prostate (HCC) 10/05/2005   Past Surgical History:  Procedure Laterality Date   BACK SURGERY     CARDIAC CATHETERIZATION     CLIPPING OF ATRIAL APPENDAGE N/A 08/29/2020   Procedure: CLIPPING OF ATRIAL APPENDAGE USING ATRICURE 45 MM ATRICLIP FLEX-V;  Surgeon: Army Dallas NOVAK, MD;  Location: [facility] OR;  Service: Open Heart Surgery;  Laterality: N/A;   CORONARY ARTERY BYPASS GRAFT N/A 08/29/2020   Procedure: CORONARY ARTERY BYPASS GRAFTING (CABG) X 5 USING LEFT INTERNAL MAMMARY ARTERY AND ENDOSCOPICALLY HARVESTED RIGHT GREATER SAPHENOUS VEIN. LIMA TO LAD, SVG TO OM SEQ TO CIRC, SVG TO PD, SVG TO DIAG.;  Surgeon: Army Dallas NOVAK, MD;  Location: [facility] OR;  Service: Open Heart Surgery;  Laterality: N/A;   ENDOVEIN HARVEST OF GREATER SAPHENOUS VEIN Right 08/29/2020   Procedure: ENDOVEIN HARVEST OF GREATER SAPHENOUS VEIN;  Surgeon: Army Dallas NOVAK, MD;  Location: [facility] OR;  Service: Open Heart Surgery;  Laterality: Right;   HERNIA REPAIR     LEFT HEART CATH AND CORONARY ANGIOGRAPHY N/A 08/25/2020   Procedure: LEFT HEART CATH AND CORONARY ANGIOGRAPHY;  Surgeon: Darron Deatrice LABOR, MD;  Location: [facility] INVASIVE CV LAB;  Service: Cardiovascular;  Laterality: N/A;   PACEMAKER IMPLANT N/A 10/12/2021   Procedure: PACEMAKER IMPLANT;  Surgeon: Cindie Ole DASEN, MD;  Location: [facility] INVASIVE CV LAB;  Service: Cardiovascular;  Laterality: N/A;   RIGHT/LEFT HEART CATH AND CORONARY ANGIOGRAPHY N/A 08/31/2021   Procedure: RIGHT/LEFT HEART CATH AND CORONARY ANGIOGRAPHY;  Surgeon: Darron Deatrice LABOR, MD;  Location: [facility] INVASIVE CV LAB;  Service: Cardiovascular;  Laterality: N/A;   RIGHT/LEFT HEART CATH AND CORONARY/GRAFT ANGIOGRAPHY N/A 06/06/2023   Procedure: RIGHT/LEFT HEART CATH AND CORONARY/GRAFT ANGIOGRAPHY;  Surgeon: Darron Deatrice LABOR, MD;  Location: [facility] INVASIVE CV LAB;   Service: Cardiovascular;  Laterality: N/A;   TEE WITHOUT CARDIOVERSION N/A 08/29/2020   Procedure: TRANSESOPHAGEAL ECHOCARDIOGRAM (TEE);  Surgeon: Army Dallas NOVAK, MD;  Location: [facility] OR;  Service: Open Heart Surgery;  Laterality: N/A;   Current Outpatient Medications on File Prior to Visit  Medication Sig Dispense Refill   acetaminophen  (TYLENOL ) 500 MG tablet Take 1,000 mg by mouth every 6 (six) hours as needed for mild pain, fever or headache.     acyclovir  (ZOVIRAX ) 800 MG tablet Take 800 mg by mouth daily.     apixaban  (ELIQUIS ) 2.5 MG TABS tablet Take 1 tablet (2.5 mg total) by mouth 2 (two) times daily. 60 tablet 6   carvedilol  (COREG ) 6.25 MG tablet TAKE 1 TABLET BY MOUTH 2 TIMES DAILY WITH A MEAL. 180 tablet 1   dapagliflozin  propanediol (FARXIGA ) 10 MG TABS tablet Take 1 tablet (10 mg total) by mouth daily. 30 tablet 2   ezetimibe  (ZETIA ) 10 MG tablet TAKE 1 TABLET BY MOUTH EVERY DAY 90 tablet 1   hydrALAZINE  (APRESOLINE ) 25 MG tablet Take 25 mg by mouth 2 (two) times daily.     Insulin  Glargine (BASAGLAR  KWIKPEN) 100 UNIT/ML Inject 15 Units into the skin at bedtime.  isosorbide  mononitrate (IMDUR ) 60 MG 24 hr tablet Take 60 mg by mouth daily.     magnesium  oxide (MAG-OX) 400 MG tablet Take 1 tablet (400 mg total) by mouth daily. 90 tablet 1   metolazone  (ZAROXOLYN ) 2.5 MG tablet Take 2.5 mg by mouth 2 (two) times a week. Every Tuesday and Friday     pantoprazole  (PROTONIX ) 40 MG tablet Take 40 mg by mouth daily.     polyethylene glycol (MIRALAX  / GLYCOLAX ) 17 g packet Take 17 g by mouth daily as needed for moderate constipation. 30 each 0   potassium chloride  SA (KLOR-CON  M) 20 MEQ tablet Take 2 tablets (40 mEq total) by mouth daily. 30 tablet 5   REPATHA SURECLICK 140 MG/ML SOAJ Inject 140 mg into the skin.     rosuvastatin  (CRESTOR ) 40 MG tablet Take 1 tablet (40 mg total) by mouth daily. 90 tablet 3   sacubitril -valsartan  (ENTRESTO ) 49-51 MG Take 1 tablet by mouth 2 (two)  times daily. 60 tablet 11   spironolactone  (ALDACTONE ) 25 MG tablet Take 1 tablet (25 mg total) by mouth daily. 30 tablet 5   tamsulosin  (FLOMAX ) 0.4 MG CAPS capsule Take 0.4 mg by mouth daily.     Torsemide  60 MG TABS Take 60 mg by mouth daily. 270 tablet 1   No current facility-administered medications on file prior to visit.    Allergies  Allergen Reactions   Angiotensin Receptor Blockers     hyperkalemia   Spironolactone      Hyperkalemia Pt states he is not allergic   Metformin Diarrhea   Social History   Occupational History   Not on file  Tobacco Use   Smoking status: Former    Current packs/day: 0.00    Types: Cigarettes    Quit date: 07/26/1996    Years since quitting: 27.9   Smokeless tobacco: Never  Vaping Use   Vaping status: Never Used  Substance and Sexual Activity   Alcohol use: Not Currently   Drug use: Never   Sexual activity: Yes   Family History  Problem Relation Age of Onset   Heart attack Mother    Immunization History  Administered Date(s) Administered   Fluad Quad(high Dose 65+) 08/29/2021, 10/19/2022   H1N1 09/26/2008   Influenza, High Dose Seasonal PF 08/25/2019   Influenza,inj,Quad PF,6+ Mos 07/10/2013, 08/19/2014, 11/13/2015, 08/25/2017, 08/10/2018, 12/29/2020   Influenza,trivalent, recombinat, inj, PF 09/06/2006, 09/05/2007, 09/30/2009   Influenza-Unspecified 10/05/2011, 10/01/2015   Moderna Covid-19 Fall Seasonal Vaccine 43yrs & older 10/19/2022   PFIZER(Purple Top)SARS-COV-2 Vaccination 12/07/2019, 12/28/2019, 11/17/2020   Pneumococcal Conjugate-13 07/09/2014   Pneumococcal Polysaccharide-23 09/25/2002, 08/25/2017   Tdap 11/20/2019   Zoster Recombinant(Shingrix) 01/23/2015, 03/23/2021     Review of Systems: Negative except as noted in the HPI.   Objective: There were no vitals filed for this visit.  [name] is a pleasant 89 y.o. male in NAD. AAO X 3.  Diabetic foot exam was performed with the following findings:   Normal  sensation of 10g monofilament Vascular Examination: CFT less than 3 seconds. DP pulses 1/4 b/l. PT pulses 0/4  b/l. Digital hair absent. Skin temperature gradient warm to warn b/l. No ischemia or gangrene. No cyanosis or clubbing noted b/l. +1 pitting edema noted BLE.   Neurological Examination: Sensation grossly intact b/l with 10 gram monofilament. Vibratory sensation intact b/l.   Dermatological Examination: No open wounds. No interdigital macerations.   Toenails 1-5 b/l thick, discolored, elongated with subungual debris and pain on dorsal palpation.  No corns, calluses, nor porokeratotic lesions.  Musculoskeletal Examination: Muscle strength 5/5 to all lower extremity muscle groups bilaterally. No pain, crepitus or joint limitation noted with ROM b/l LE. Pes planus deformity noted bilateral LE.SABRA Patient ambulates with cane assistance.  Radiographs: None     Lab Results  Component Value Date   HGBA1C 7.5 (H) 12/23/2023   ADA Risk Categorization: High Risk  Patient has one or more of the following: Loss of protective sensation Absent pedal pulses Severe Foot deformity History of foot ulcer  Assessment: 1. Pain due to onychomycosis of toenails of both feet   2. Bilateral lower extremity edema   3. Pes planus of both feet   4. Type II diabetes mellitus with peripheral circulatory disorder (HCC)   5. Encounter for diabetic foot exam (HCC)     Plan: Diabetic foot examination performed today. All patient's and/or POA's questions/concerns addressed on today's visit. Toenails 1-5 debrided in length and girth without incident. Continue foot and shoe inspections daily. Monitor blood glucose per PCP/Endocrinologist's recommendations. Continue soft, supportive shoe gear daily. Report any pedal injuries to medical professional. Call office if there are any questions/concerns. -Patient/POA to call should there be question/concern in the interim. Return in about 3 months (around  09/22/2024).  Delon LITTIE Merlin, DPM       LOCATION: [address] [place] [address]                   Office [phone]   [facility] LOCATION: [address] [place], [place] [address] Office [phone]

## 2024-07-06 ENCOUNTER — Other Ambulatory Visit: Payer: Self-pay

## 2024-07-06 ENCOUNTER — Emergency Department

## 2024-07-06 ENCOUNTER — Inpatient Hospital Stay
Admission: EM | Admit: 2024-07-06 | Discharge: 2024-07-08 | DRG: 291 | Disposition: A | Discharge status: Home Health | Attending: Internal Medicine | Admitting: Internal Medicine

## 2024-07-06 DIAGNOSIS — I272 Pulmonary hypertension, unspecified: Secondary | ICD-10-CM | POA: Diagnosis present

## 2024-07-06 DIAGNOSIS — I2510 Atherosclerotic heart disease of native coronary artery without angina pectoris: Secondary | ICD-10-CM | POA: Diagnosis present

## 2024-07-06 DIAGNOSIS — Z794 Long term (current) use of insulin: Secondary | ICD-10-CM

## 2024-07-06 DIAGNOSIS — R0600 Dyspnea, unspecified: Secondary | ICD-10-CM

## 2024-07-06 DIAGNOSIS — Z888 Allergy status to other drugs, medicaments and biological substances status: Secondary | ICD-10-CM

## 2024-07-06 DIAGNOSIS — I255 Ischemic cardiomyopathy: Secondary | ICD-10-CM | POA: Diagnosis present

## 2024-07-06 DIAGNOSIS — E785 Hyperlipidemia, unspecified: Secondary | ICD-10-CM | POA: Diagnosis present

## 2024-07-06 DIAGNOSIS — I480 Paroxysmal atrial fibrillation: Secondary | ICD-10-CM | POA: Diagnosis present

## 2024-07-06 DIAGNOSIS — I5023 Acute on chronic systolic (congestive) heart failure: Secondary | ICD-10-CM | POA: Diagnosis not present

## 2024-07-06 DIAGNOSIS — R0789 Other chest pain: Principal | ICD-10-CM

## 2024-07-06 DIAGNOSIS — E1122 Type 2 diabetes mellitus with diabetic chronic kidney disease: Secondary | ICD-10-CM | POA: Diagnosis present

## 2024-07-06 DIAGNOSIS — Z8249 Family history of ischemic heart disease and other diseases of the circulatory system: Secondary | ICD-10-CM

## 2024-07-06 DIAGNOSIS — I493 Ventricular premature depolarization: Secondary | ICD-10-CM | POA: Diagnosis present

## 2024-07-06 DIAGNOSIS — I509 Heart failure, unspecified: Secondary | ICD-10-CM | POA: Diagnosis not present

## 2024-07-06 DIAGNOSIS — N1832 Chronic kidney disease, stage 3b: Secondary | ICD-10-CM | POA: Diagnosis present

## 2024-07-06 DIAGNOSIS — Z955 Presence of coronary angioplasty implant and graft: Secondary | ICD-10-CM

## 2024-07-06 DIAGNOSIS — Z87891 Personal history of nicotine dependence: Secondary | ICD-10-CM

## 2024-07-06 DIAGNOSIS — I130 Hypertensive heart and chronic kidney disease with heart failure and stage 1 through stage 4 chronic kidney disease, or unspecified chronic kidney disease: Secondary | ICD-10-CM | POA: Diagnosis not present

## 2024-07-06 DIAGNOSIS — Z1152 Encounter for screening for COVID-19: Secondary | ICD-10-CM

## 2024-07-06 DIAGNOSIS — R531 Weakness: Secondary | ICD-10-CM | POA: Diagnosis present

## 2024-07-06 DIAGNOSIS — I5082 Biventricular heart failure: Secondary | ICD-10-CM | POA: Diagnosis present

## 2024-07-06 DIAGNOSIS — Z79899 Other long term (current) drug therapy: Secondary | ICD-10-CM

## 2024-07-06 DIAGNOSIS — N179 Acute kidney failure, unspecified: Secondary | ICD-10-CM | POA: Diagnosis present

## 2024-07-06 DIAGNOSIS — R5383 Other fatigue: Secondary | ICD-10-CM | POA: Diagnosis present

## 2024-07-06 DIAGNOSIS — Z7901 Long term (current) use of anticoagulants: Secondary | ICD-10-CM

## 2024-07-06 DIAGNOSIS — E876 Hypokalemia: Secondary | ICD-10-CM | POA: Diagnosis present

## 2024-07-06 DIAGNOSIS — I5A Non-ischemic myocardial injury (non-traumatic): Secondary | ICD-10-CM | POA: Diagnosis present

## 2024-07-06 DIAGNOSIS — Z951 Presence of aortocoronary bypass graft: Secondary | ICD-10-CM

## 2024-07-06 DIAGNOSIS — Z8674 Personal history of sudden cardiac arrest: Secondary | ICD-10-CM

## 2024-07-06 LAB — GLUCOSE, CAPILLARY: Glucose-Capillary: 159 mg/dL — ABNORMAL HIGH (ref 70–99)

## 2024-07-06 LAB — SARS CORONAVIRUS 2 BY RT PCR: SARS Coronavirus 2 by RT PCR: NEGATIVE

## 2024-07-06 LAB — TROPONIN I (HIGH SENSITIVITY)
Troponin I (High Sensitivity): 33 ng/L — ABNORMAL HIGH (ref <18)
Troponin I (High Sensitivity): 26 ng/L — ABNORMAL HIGH (ref <18)

## 2024-07-06 LAB — BASIC METABOLIC PANEL WITH GFR
Anion gap: 14 (ref 5–15)
BUN: 63 mg/dL — ABNORMAL HIGH (ref 8–23)
CO2: 27 mmol/L (ref 22–32)
Calcium: 8.6 mg/dL — ABNORMAL LOW (ref 8.9–10.3)
Chloride: 102 mmol/L (ref 98–111)
Creatinine, Ser: 2.06 mg/dL — ABNORMAL HIGH (ref 0.61–1.24)
GFR, Estimated: 31 mL/min — ABNORMAL LOW (ref >60)
Glucose, Bld: 148 mg/dL — ABNORMAL HIGH (ref 70–99)
Potassium: 3.2 mmol/L — ABNORMAL LOW (ref 3.5–5.1)
Sodium: 143 mmol/L (ref 135–145)

## 2024-07-06 LAB — CBC
HCT: 41.8 % (ref 39.0–52.0)
Hemoglobin: 13.2 g/dL (ref 13.0–17.0)
MCH: 28.3 pg (ref 26.0–34.0)
MCHC: 31.6 g/dL (ref 30.0–36.0)
MCV: 89.5 fL (ref 80.0–100.0)
Platelets: 101 K/uL — ABNORMAL LOW (ref 150–400)
RBC: 4.67 MIL/uL (ref 4.22–5.81)
RDW: 15.7 % — ABNORMAL HIGH (ref 11.5–15.5)
WBC: 3.8 K/uL — ABNORMAL LOW (ref 4.0–10.5)
nRBC: 0 % (ref 0.0–0.2)

## 2024-07-06 LAB — MAGNESIUM: Magnesium: 1.7 mg/dL (ref 1.7–2.4)

## 2024-07-06 LAB — PROTIME-INR
INR: 1.3 — ABNORMAL HIGH (ref 0.8–1.2)
Prothrombin Time: 16.8 s — ABNORMAL HIGH (ref 11.4–15.2)

## 2024-07-06 LAB — PHOSPHORUS: Phosphorus: 3.1 mg/dL (ref 2.5–4.6)

## 2024-07-06 LAB — BRAIN NATRIURETIC PEPTIDE: B Natriuretic Peptide: >4500 pg/mL — ABNORMAL HIGH (ref 0.0–100.0)

## 2024-07-06 MED ORDER — PANTOPRAZOLE SODIUM 40 MG PO TBEC
40.0000 mg | DELAYED_RELEASE_TABLET | Freq: Every day | ORAL | Status: DC
  Administered 2024-07-07 – 2024-07-08 (×2): 40 mg via ORAL
  Filled 2024-07-06 (×2): qty 1

## 2024-07-06 MED ORDER — SODIUM CHLORIDE 0.9 % IV SOLN: 250.0000 mL | INTRAVENOUS | Status: AC | PRN

## 2024-07-06 MED ORDER — ISOSORBIDE MONONITRATE ER 60 MG PO TB24
60.0000 mg | ORAL_TABLET | Freq: Every day | ORAL | Status: DC
  Administered 2024-07-07 – 2024-07-08 (×2): 60 mg via ORAL
  Filled 2024-07-06 (×2): qty 1

## 2024-07-06 MED ORDER — CARVEDILOL 6.25 MG PO TABS
6.2500 mg | ORAL_TABLET | Freq: Two times a day (BID) | ORAL | Status: DC
  Administered 2024-07-06 – 2024-07-08 (×4): 6.25 mg via ORAL
  Filled 2024-07-06 (×4): qty 1

## 2024-07-06 MED ORDER — ONDANSETRON HCL 4 MG/2ML IJ SOLN: 4.0000 mg | Freq: Four times a day (QID) | INTRAMUSCULAR | Status: DC | PRN

## 2024-07-06 MED ORDER — ACETAMINOPHEN 500 MG PO TABS
1000.0000 mg | ORAL_TABLET | Freq: Four times a day (QID) | ORAL | Status: DC | PRN
  Administered 2024-07-07: 1000 mg via ORAL
  Filled 2024-07-06: qty 2

## 2024-07-06 MED ORDER — METOLAZONE 2.5 MG PO TABS
2.5000 mg | ORAL_TABLET | Freq: Once | ORAL | Status: AC
  Administered 2024-07-06: 2.5 mg via ORAL
  Filled 2024-07-06 (×2): qty 1

## 2024-07-06 MED ORDER — SACUBITRIL-VALSARTAN 49-51 MG PO TABS
1.0000 | ORAL_TABLET | Freq: Two times a day (BID) | ORAL | Status: DC
  Administered 2024-07-06 – 2024-07-08 (×4): 1 via ORAL
  Filled 2024-07-06 (×4): qty 1

## 2024-07-06 MED ORDER — SPIRONOLACTONE 25 MG PO TABS
25.0000 mg | ORAL_TABLET | Freq: Every day | ORAL | Status: DC
  Administered 2024-07-07 – 2024-07-08 (×2): 25 mg via ORAL
  Filled 2024-07-06 (×2): qty 1

## 2024-07-06 MED ORDER — POLYETHYLENE GLYCOL 3350 17 G PO PACK: 17.0000 g | PACK | Freq: Every day | ORAL | Status: DC | PRN

## 2024-07-06 MED ORDER — POTASSIUM CHLORIDE CRYS ER 20 MEQ PO TBCR
40.0000 meq | EXTENDED_RELEASE_TABLET | ORAL | Status: AC
  Administered 2024-07-06 (×2): 40 meq via ORAL
  Filled 2024-07-06 (×2): qty 2

## 2024-07-06 MED ORDER — HYDRALAZINE HCL 50 MG PO TABS
25.0000 mg | ORAL_TABLET | Freq: Two times a day (BID) | ORAL | Status: DC
  Administered 2024-07-06 – 2024-07-08 (×4): 25 mg via ORAL
  Filled 2024-07-06 (×4): qty 1

## 2024-07-06 MED ORDER — FUROSEMIDE 10 MG/ML IJ SOLN
40.0000 mg | Freq: Two times a day (BID) | INTRAMUSCULAR | Status: DC
  Administered 2024-07-07 (×2): 40 mg via INTRAVENOUS
  Filled 2024-07-06 (×2): qty 4

## 2024-07-06 MED ORDER — TAMSULOSIN HCL 0.4 MG PO CAPS
0.4000 mg | ORAL_CAPSULE | Freq: Every day | ORAL | Status: DC
  Administered 2024-07-07 – 2024-07-08 (×2): 0.4 mg via ORAL
  Filled 2024-07-06 (×2): qty 1

## 2024-07-06 MED ORDER — SODIUM CHLORIDE 0.9% FLUSH
3.0000 mL | Freq: Two times a day (BID) | INTRAVENOUS | Status: DC
  Administered 2024-07-06 – 2024-07-08 (×4): 3 mL via INTRAVENOUS

## 2024-07-06 MED ORDER — MAGNESIUM OXIDE 400 MG PO TABS
400.0000 mg | ORAL_TABLET | Freq: Every day | ORAL | Status: DC
  Administered 2024-07-07 – 2024-07-08 (×2): 400 mg via ORAL
  Filled 2024-07-06 (×4): qty 1

## 2024-07-06 MED ORDER — EZETIMIBE 10 MG PO TABS
10.0000 mg | ORAL_TABLET | Freq: Every day | ORAL | Status: DC
  Administered 2024-07-07 – 2024-07-08 (×2): 10 mg via ORAL
  Filled 2024-07-06 (×2): qty 1

## 2024-07-06 MED ORDER — APIXABAN 2.5 MG PO TABS
2.5000 mg | ORAL_TABLET | Freq: Two times a day (BID) | ORAL | Status: DC
  Administered 2024-07-06 – 2024-07-08 (×4): 2.5 mg via ORAL
  Filled 2024-07-06 (×4): qty 1

## 2024-07-06 MED ORDER — ROSUVASTATIN CALCIUM 20 MG PO TABS
40.0000 mg | ORAL_TABLET | Freq: Every day | ORAL | Status: DC
  Administered 2024-07-07 – 2024-07-08 (×2): 40 mg via ORAL
  Filled 2024-07-06: qty 4
  Filled 2024-07-06: qty 2
  Filled 2024-07-06: qty 4
  Filled 2024-07-06: qty 2

## 2024-07-06 MED ORDER — INSULIN ASPART 100 UNIT/ML IJ SOLN
0.0000 [IU] | Freq: Three times a day (TID) | INTRAMUSCULAR | Status: DC
  Administered 2024-07-07 – 2024-07-08 (×3): 2 [IU] via SUBCUTANEOUS
  Filled 2024-07-06 (×3): qty 1

## 2024-07-06 MED ORDER — FUROSEMIDE 10 MG/ML IJ SOLN
40.0000 mg | Freq: Once | INTRAMUSCULAR | Status: AC
  Administered 2024-07-06: 40 mg via INTRAVENOUS
  Filled 2024-07-06: qty 4

## 2024-07-06 MED ORDER — SODIUM CHLORIDE 0.9% FLUSH: 3.0000 mL | INTRAVENOUS | Status: DC | PRN

## 2024-07-06 NOTE — ED Provider Notes (Signed)
 [facility] Provider Note    Event Date/Time   First MD Initiated Contact with Patient 07/06/24 1419     (approximate)   History   Chest Pain   HPI  [name] is a 89 y.o. male history of congestive heart failure, hypertension, chronic renal disease coronary disease and paroxysms of A-fib   Last night started to feel short of breath and a sense of chest tightness.  This persisted and was hard to sleep last night.  No cough no fever.  He reports he had similar when he was in the hospital last time about a month or 2 ago.  Reports he is compliant with all of his medications including his fluid pills as well as blood thinner Eliquis  and last took these this morning  He reports its like a sense of tightness throughout the whole chest not any 1 particular spot does not radiate.  Feels about the same as when he had too much fluid a month or 2 ago.  No abdominal pain.  Some swelling in his legs.  Physical Exam   Triage Vital Signs: ED Triage Vitals  Encounter Vitals Group     BP 07/06/24 1420 (!) 157/96     Girls Systolic BP Percentile --      Girls Diastolic BP Percentile --      Boys Systolic BP Percentile --      Boys Diastolic BP Percentile --      Pulse Rate 07/06/24 1420 60     Resp 07/06/24 1420 18     Temp 07/06/24 1420 97.9 F (36.6 C)     Temp Source 07/06/24 1420 Oral     SpO2 07/06/24 1420 97 %     Weight 07/06/24 1419 205 lb (93 kg)     Height 07/06/24 1419 5' 8 (1.727 m)     Head Circumference --      Peak Flow --      Pain Score 07/06/24 1417 0     Pain Loc --      Pain Education --      Exclude from Growth Chart --     Most recent vital signs: Vitals:   07/06/24 1420  BP: (!) 157/96  Pulse: 60  Resp: 18  Temp: 97.9 F (36.6 C)  SpO2: 97%     General: Awake, no distress.  Mildly increased respiratory rate and accessory muscle use. CV:  Good peripheral perfusion.  Normal tones no murmur Resp:  Very mild accessory  muscle use speaks without hypoxic episodes.  On room air, prefers to sit up.  No tripoding or weakening.  Lung sounds diminished in the bases no obvious Rales to noted Abd:  No distention.  Soft, nontender. Other:  Moderate equal bilateral lower extremity edema not severe   ED Results / Procedures / Treatments   Labs (all labs ordered are listed, but only abnormal results are displayed) Labs Reviewed  CBC  BRAIN NATRIURETIC PEPTIDE  BASIC METABOLIC PANEL WITH GFR  PROTIME-INR  TROPONIN I (HIGH SENSITIVITY)     EKG  And inter by me at 1430 heart rate 70 QRS 130 QTc 500 Normal sinus rhythm with occasional PVC.  I do not see clear evidence of A-fib.  No evidence of frank ischemia.   RADIOLOGY  Chest x-ray pending Dr. Jacolyn to follow-up  PROCEDURES:  Critical Care performed: No  Procedures   MEDICATIONS ORDERED IN ED: Medications - No data to display   IMPRESSION / MDM /  ASSESSMENT AND PLAN / ED COURSE  I reviewed the triage vital signs and the nursing notes.                              Differential diagnosis includes, but is not limited to, CHF volume overload pneumonia, ACS though it seems less likely given his description of symptoms but awaiting labs and troponin, AKI, volume overload, pneumonia etc.  Differential notes fairly broad.  Low risk for thromboembolism no acute tachycardia no hypoxia and he is actively anticoagulated on Eliquis .  He reports same symptoms when he was hospitalized about 2 months ago, and I am suspicious this represents CHF though no frank crackles or rails.  Await workup and imaging prior to initiating treatment decision at this point as he demonstrates stability at this time.  Patient's presentation is most consistent with acute complicated illness / injury requiring diagnostic workup.   The patient is on the cardiac monitor to evaluate for evidence of arrhythmia and/or significant heart rate changes.   Clinical Course as of 07/06/24  1512  Fri Jul 06, 2024  1507 Ongoing care and disposition signed to Dr. Jacolyn.  Patient here for chest pressure and dyspnea.  History of congestive heart failure coronary disease.  Follow-up on pending studies including CBC metabolic panel troponin BNP chest x-ray.  Recent admission for CHF, based on further evaluation and imaging will need further treatment and disposition.  Unclear if he will require admission at this time [MQ]    Clinical Course User Index [MQ] Dicky Anes, MD     FINAL CLINICAL IMPRESSION(S) / ED DIAGNOSES   Final diagnoses:  Chest tightness  Dyspnea, unspecified type     Rx / DC Orders   ED Discharge Orders     None        Note:  This document was prepared using Dragon voice recognition software and may include unintentional dictation errors.   Dicky Anes, MD 07/06/24 [phone]

## 2024-07-06 NOTE — ED Triage Notes (Signed)
 Pt to ED via [facility] from home. Pt states he was at family reunion over the weekend and has not felt well since. Pt states urinary frequency, on fluid pill. Pt states chest tightness and shortness of breath. Pt on blood thinners. Given 324 mg ASA by EMS.

## 2024-07-06 NOTE — H&P (Addendum)
 History and Physical    [name] FMW:[id number] DOB: 26-Dec-1934 DOA: 07/06/2024  PCP: [name], MD (Confirm with patient/family/NH records and if not entered, this has to be entered at [facility] point of entry) Patient coming from: [facility]  I have personally briefly reviewed patient's old medical records in [facility]  Chief Complaint: SOB, cough  HPI: [name] is a 89 y.o. 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  Symptoms started 2 weeks ago, patient started to gradually feel increasing exertional dyspnea and productive cough with clear phlegm, increasing orthopnea and lethargy.  Denied any fever chills no chest pains.  Meantime he also noticed increasing swelling of his legs.  He reported that is been compliant with all his medication including torsemide  20 mg 3 times daily and he makes normal amount of urine, one of his daughters checks his blood pressure every day and found blood pressure has been controlled.  ED Course: Afebrile, blood pressure 160/90 pulse 60 O2 saturation 95% on room air but decreased to 90% with minimal activity and during conversation.  Chest x-ray showed mild pulmonary congestion and mild bilateral pleural effusions.  Blood work showed sodium 143 potassium 3.2 BUN 63, creatinine 2.0 troponins 4/33.  Patient was given IV Lasix  40 mg x 1 in the ED.  Review of Systems: As per HPI otherwise 14 point review of systems negative.    Past Medical History:  Diagnosis Date   AV block, Mobitz 1    CAD (coronary artery disease)    a. 08/2019 VF arrest-->sev 3vd on cath-->CABGx5 (LIMA->LAD, VG->OM->LCX, VG->RPDA, VG->Diag); b. 08/2021 Cath: sev native  multivessel dzs w/ 5/5 patent grafts. Nl filling pressures->Med rx; c. 05/2023 Cath: stable anatomy->Med rx.   CKD (chronic kidney disease), stage III (HCC)    Diabetes mellitus without complication (HCC)    HFrEF (heart failure with reduced ejection fraction) (HCC)    a. 08/2020 Echo: EF 40-45%; b. 11/2020 Echo: EF 55%, Gr2 DD; c. 08/2021 Echo: EF 35-40%, glob HK w/ ant/antsept/apical HK; d. 10/2021 s/p CRT-P; e. 12/2021 Echo: EF 30-35%, glob HK, GrIII DD; f. 12/2022 Echo: EF 30-35%, glob HK, sev HK of ant wall, mod asymm LVH, nl RV fxn, mildly dil LA, triv MR, AoV sclerosis.   Hyperlipidemia LDL goal <70    Hypertension    Intermittent Complete heart block (HCC)    a. 08/2021 noted on Zio; b. 10/2021 s/p Abbott Allure RF CRT-P (Ser # [id number]).   Ischemic cardiomyopathy    a. 08/2020 Echo: EF 40-45%; b. 11/2020 Echo: EF 55%; c. 08/2021 Echo: EF 35-40%; d. 10/2021 s/p CRT-P; e. 12/2021 Echo: EF 30-35%, glob HK, GrIII DD.   PAF (paroxysmal atrial fibrillation) (HCC)    a.Medical device check-->Eliquis  (CHA2DS2VASc = 6)   Trifascicular block     Past Surgical History:  Procedure Laterality Date   BACK SURGERY     CARDIAC CATHETERIZATION     CLIPPING OF ATRIAL APPENDAGE N/A 08/29/2020   Procedure: CLIPPING OF ATRIAL APPENDAGE USING ATRICURE 45 MM ATRICLIP FLEX-V;  Surgeon: Army Dallas NOVAK, MD;  Location: [facility] OR;  Service: Open Heart Surgery;  Laterality: N/A;   CORONARY ARTERY BYPASS GRAFT N/A 08/29/2020   Procedure: CORONARY ARTERY BYPASS GRAFTING (CABG)  X 5 USING LEFT INTERNAL MAMMARY ARTERY AND ENDOSCOPICALLY HARVESTED RIGHT GREATER SAPHENOUS VEIN. LIMA TO LAD, SVG TO OM SEQ TO CIRC, SVG TO PD, SVG TO DIAG.;  Surgeon: Army Dallas NOVAK, MD;  Location: [facility] OR;  Service: Open Heart Surgery;  Laterality: N/A;   ENDOVEIN HARVEST OF GREATER SAPHENOUS VEIN Right 08/29/2020   Procedure: ENDOVEIN HARVEST OF GREATER SAPHENOUS VEIN;  Surgeon: Army Dallas NOVAK, MD;  Location: [facility] OR;  Service: Open Heart  Surgery;  Laterality: Right;   HERNIA REPAIR     LEFT HEART CATH AND CORONARY ANGIOGRAPHY N/A 08/25/2020   Procedure: LEFT HEART CATH AND CORONARY ANGIOGRAPHY;  Surgeon: Darron Deatrice LABOR, MD;  Location: [facility] INVASIVE CV LAB;  Service: Cardiovascular;  Laterality: N/A;   PACEMAKER IMPLANT N/A 10/12/2021   Procedure: PACEMAKER IMPLANT;  Surgeon: Cindie Ole DASEN, MD;  Location: [facility] INVASIVE CV LAB;  Service: Cardiovascular;  Laterality: N/A;   RIGHT/LEFT HEART CATH AND CORONARY ANGIOGRAPHY N/A 08/31/2021   Procedure: RIGHT/LEFT HEART CATH AND CORONARY ANGIOGRAPHY;  Surgeon: Darron Deatrice LABOR, MD;  Location: [facility] INVASIVE CV LAB;  Service: Cardiovascular;  Laterality: N/A;   RIGHT/LEFT HEART CATH AND CORONARY/GRAFT ANGIOGRAPHY N/A 06/06/2023   Procedure: RIGHT/LEFT HEART CATH AND CORONARY/GRAFT ANGIOGRAPHY;  Surgeon: Darron Deatrice LABOR, MD;  Location: [facility] INVASIVE CV LAB;  Service: Cardiovascular;  Laterality: N/A;   TEE WITHOUT CARDIOVERSION N/A 08/29/2020   Procedure: TRANSESOPHAGEAL ECHOCARDIOGRAM (TEE);  Surgeon: Army Dallas NOVAK, MD;  Location: [facility] OR;  Service: Open Heart Surgery;  Laterality: N/A;     reports that he quit smoking about 27 years ago. His smoking use included cigarettes. He has never used smokeless tobacco. He reports that he does not currently use alcohol. He reports that he does not use drugs.  Allergies  Allergen Reactions   Angiotensin Receptor Blockers     hyperkalemia   Spironolactone      Hyperkalemia Pt states he is not allergic   Metformin Diarrhea    Family History  Problem Relation Age of Onset   Heart attack Mother      Prior to Admission medications   Medication Sig Start Date End Date Taking? Authorizing Provider  acetaminophen  (TYLENOL ) 500 MG tablet Take 1,000 mg by mouth every 6 (six) hours as needed for mild pain, fever or headache.    [provider]  acyclovir  (ZOVIRAX ) 800 MG tablet Take 800 mg by mouth daily.    [provider]  apixaban  (ELIQUIS ) 2.5 MG TABS tablet Take 1 tablet (2.5 mg total) by mouth 2 (two) times daily. 02/08/23   Terra Fairy PARAS, PA-C  carvedilol  (COREG ) 6.25 MG tablet TAKE 1 TABLET BY MOUTH 2 TIMES DAILY WITH A MEAL. 02/21/24   Zenaida Morene PARAS, MD  dapagliflozin  propanediol (FARXIGA ) 10 MG TABS tablet Take 1 tablet (10 mg total) by mouth daily. 12/27/23   Awanda City, MD  ezetimibe  (ZETIA ) 10 MG tablet TAKE 1 TABLET BY MOUTH EVERY DAY 06/29/24   McLean, Dalton S, MD  hydrALAZINE  (APRESOLINE ) 25 MG tablet Take 25 mg by mouth 2 (two) times daily. 04/02/24   [provider]  Insulin  Glargine (BASAGLAR  KWIKPEN) 100 UNIT/ML Inject 15 Units into the skin at bedtime. 02/19/24 02/18/25  [provider]  isosorbide  mononitrate (IMDUR ) 60 MG 24 hr tablet Take 60 mg by mouth daily. 04/02/24   [provider]  magnesium  oxide (MAG-OX) 400 MG tablet Take 1 tablet (400 mg total) by mouth daily. 01/20/22   Cindie Ole DASEN, MD  metolazone  (ZAROXOLYN ) 2.5 MG tablet Take 2.5 mg by mouth 2 (two) times a week. Every Tuesday and Friday    [provider]  pantoprazole  (PROTONIX ) 40 MG tablet Take 40 mg by mouth daily.    [provider]  polyethylene glycol (MIRALAX  / GLYCOLAX ) 17 g packet Take 17 g by mouth daily as needed for moderate constipation. 12/19/22   Josette Ade, MD  potassium chloride  SA (KLOR-CON  M) 20 MEQ tablet Take 2 tablets (40 mEq total) by mouth daily. 04/20/24   Donette Ellouise LABOR, FNP  REPATHA SURECLICK 140 MG/ML SOAJ Inject 140 mg into the skin.    [provider]  rosuvastatin  (CRESTOR ) 40 MG tablet Take 1 tablet (40 mg total) by mouth daily. 05/25/23   Gerard Frederick, NP  sacubitril -valsartan  (ENTRESTO ) 49-51 MG Take 1 tablet by mouth 2 (two) times daily. 01/25/24   Zenaida Morene PARAS, MD  spironolactone  (ALDACTONE ) 25 MG tablet Take 1 tablet (25 mg total) by mouth daily. 04/19/24 05/30/24  Donette Ellouise LABOR, FNP  tamsulosin  (FLOMAX ) 0.4 MG  CAPS capsule Take 0.4 mg by mouth daily.    [provider]  Torsemide  60 MG TABS Take 60 mg by mouth daily. 05/18/24 06/17/24  Rolan Ezra RAMAN, MD    Physical Exam: Vitals:   07/06/24 1419 07/06/24 1420 07/06/24 1500  BP:  (!) 157/96 (!) 154/84  Pulse:  60 62  Resp:  18 18  Temp:  97.9 F (36.6 C)   TempSrc:  Oral   SpO2:  97% 99%  Weight: 93 kg    Height: 5' 8 (1.727 m)      Constitutional: NAD, calm, comfortable Vitals:   07/06/24 1419 07/06/24 1420 07/06/24 1500  BP:  (!) 157/96 (!) 154/84  Pulse:  60 62  Resp:  18 18  Temp:  97.9 F (36.6 C)   TempSrc:  Oral   SpO2:  97% 99%  Weight: 93 kg    Height: 5' 8 (1.727 m)     Eyes: PERRL, lids and conjunctivae normal ENMT: Mucous membranes are moist. Posterior pharynx clear of any exudate or lesions.Normal dentition.  Neck: normal, supple, no masses, no thyromegaly Respiratory: clear to auscultation bilaterally, no wheezing, fine crackles on bilateral lower fields, increasing respiratory effort. No accessory muscle use.  Cardiovascular: Regular rate and rhythm, no murmurs / rubs / gallops.  2+ extremity edema. 2+ pedal pulses. No carotid bruits.  Abdomen: no tenderness, no masses palpated. No hepatosplenomegaly. Bowel sounds positive.  Musculoskeletal: no clubbing / cyanosis. No joint deformity upper and lower extremities. Good ROM, no contractures. Normal muscle tone.  Skin: no rashes, lesions, ulcers. No induration Neurologic: CN 2-12 grossly intact. Sensation intact, DTR normal. Strength 5/5 in all 4.  Psychiatric: Normal judgment and insight. Alert and oriented x 3. Normal mood.     Labs on Admission: I have personally reviewed following labs and imaging studies  CBC: Recent Labs  Lab 07/06/24 1512  WBC 3.8*  HGB 13.2  HCT 41.8  MCV 89.5  PLT 101*   Basic Metabolic Panel: Recent Labs  Lab 07/06/24 1512  NA 143  K 3.2*  CL 102  CO2 27  GLUCOSE 148*  BUN 63*  CREATININE 2.06*  CALCIUM  8.6*    GFR: Estimated Creatinine Clearance: 28.5 mL/min (A) (by C-G formula based on SCr of 2.06 mg/dL (H)). Liver Function Tests: No results for input(s): AST, ALT, ALKPHOS, BILITOT, PROT, ALBUMIN  in the last 168 hours. No results for input(s): LIPASE, AMYLASE in  the last 168 hours. No results for input(s): AMMONIA in the last 168 hours. Coagulation Profile: Recent Labs  Lab 07/06/24 1512  INR 1.3*   Cardiac Enzymes: No results for input(s): CKTOTAL, CKMB, CKMBINDEX, TROPONINI in the last 168 hours. BNP (last 3 results) Recent Labs    08/09/23 1058  PROBNP 6,649*   HbA1C: No results for input(s): HGBA1C in the last 72 hours. CBG: No results for input(s): GLUCAP in the last 168 hours. Lipid Profile: No results for input(s): CHOL, HDL, LDLCALC, TRIG, CHOLHDL, LDLDIRECT in the last 72 hours. Thyroid Function Tests: No results for input(s): TSH, T4TOTAL, FREET4, T3FREE, THYROIDAB in the last 72 hours. Anemia Panel: No results for input(s): VITAMINB12, FOLATE, FERRITIN, TIBC, IRON, RETICCTPCT in the last 72 hours. Urine analysis:    Component Value Date/Time   COLORURINE YELLOW (A) 03/23/2024 2042   APPEARANCEUR HAZY (A) 03/23/2024 2042   LABSPEC 1.015 03/23/2024 2042   PHURINE 6.0 03/23/2024 2042   GLUCOSEU >=500 (A) 03/23/2024 2042   HGBUR SMALL (A) 03/23/2024 2042   BILIRUBINUR NEGATIVE 03/23/2024 2042   KETONESUR NEGATIVE 03/23/2024 2042   PROTEINUR 100 (A) 03/23/2024 2042   NITRITE NEGATIVE 03/23/2024 2042   LEUKOCYTESUR LARGE (A) 03/23/2024 2042    Radiological Exams on Admission: DG Chest Portable 1 View Result Date: 07/06/2024 EXAM: 1 VIEW XRAY OF THE CHEST 07/06/2024 03:09:00 PM COMPARISON: 05/09/2024 CLINICAL HISTORY: Dyspnea. Pt to ED via [facility] from home. Pt states he was at family reunion over the weekend and has not felt well since. Pt states urinary frequency, on fluid pill. Pt states chest  tightness and shortness of breath. FINDINGS: LUNGS AND PLEURA: Streaky opacities at lung bases, most likely atelectasis. Blunted costophrenic angles suggesting possible trace pleural effusions. HEART AND MEDIASTINUM: Stable mild cardiomegaly. Left atrial appendage clip noted. CABG markers noted. Stable left subclavian dual lead pacemaker. BONES AND SOFT TISSUES: Calcifications in the right subscapularis are again noted. Sternotomy wires noted. IMPRESSION: 1. Stable mild cardiomegaly with left atrial appendage clip, CABG markers, and left subclavian dual lead pacemaker. 2. Streaky opacities at lung bases, most likely atelectasis. 3. Blunted costophrenic angles suggesting possible trace pleural effusions. Electronically signed by: Lonni Necessary MD 07/06/2024 03:16 PM EDT RP Workstation: [id number]    EKG: Independently reviewed.  A-fib, chronic ST changes on multiple leads.  Assessment/Plan Principal Problem:   CHF (congestive heart failure) (HCC) Active Problems:   Acute decompensated heart failure (HCC)  (please populate well all problems here in Problem List. (For example, if patient is on BP meds at home and you resume or decide to hold them, it is a problem that needs to be her. Same for CAD, COPD, HLD and so on)  Acute on chronic HFrEF Acute on chronic right-sided CHF decompensation Acute on chronic pulmonary hypertension decompensated - With symptoms signs of fluid overload and hypoxia, continue IV Lasix  40 mg twice daily, +1 dose of metolazone  - Blood pressure is high today, resume home BP meds including Coreg , hydralazine  Imdur , Entresto  and spironolactone   AKI on CKD stage IIIb with worsening of uremia - Significant fluid overload, clinically suspect cardiorenal syndrome - IV diuresis as above - Follow-up kidney functions  HTN, uncontrolled - As above  Hypokalemia -PO replacement -Mg pending  Elevated troponins History of CAD status post CABG and stenting - Review of  most recent LHC results showing significant underlying three-vessel disease recommend medical management - Elevation troponin likely demand ischemia from CHF decapitation, management as above. - Continue aspirin , Eliquis   PAF - In rate control A-fib - Continue telemonitoring x 24 hours - Continue Coreg  for rate control, continue Eliquis   IDDM -SSI for now  Hx of high-grade AV block status post CRT PPM - Stable  DVT prophylaxis: Eliquis  Code Status: Full code Family Communication: Daughter and wife at bedside Disposition Plan: Expect less than 2 midnight hospital stay Consults called: None Admission status: Telemetry observation   Cort ONEIDA Mana MD [facility] Pager [phone]  07/06/2024, 5:06 PM

## 2024-07-07 ENCOUNTER — Observation Stay

## 2024-07-07 DIAGNOSIS — I5A Non-ischemic myocardial injury (non-traumatic): Secondary | ICD-10-CM | POA: Diagnosis present

## 2024-07-07 DIAGNOSIS — I130 Hypertensive heart and chronic kidney disease with heart failure and stage 1 through stage 4 chronic kidney disease, or unspecified chronic kidney disease: Secondary | ICD-10-CM | POA: Diagnosis present

## 2024-07-07 DIAGNOSIS — I480 Paroxysmal atrial fibrillation: Secondary | ICD-10-CM | POA: Diagnosis present

## 2024-07-07 DIAGNOSIS — E785 Hyperlipidemia, unspecified: Secondary | ICD-10-CM | POA: Diagnosis present

## 2024-07-07 DIAGNOSIS — Z888 Allergy status to other drugs, medicaments and biological substances status: Secondary | ICD-10-CM | POA: Diagnosis not present

## 2024-07-07 DIAGNOSIS — Z955 Presence of coronary angioplasty implant and graft: Secondary | ICD-10-CM | POA: Diagnosis not present

## 2024-07-07 DIAGNOSIS — Z79899 Other long term (current) drug therapy: Secondary | ICD-10-CM | POA: Diagnosis not present

## 2024-07-07 DIAGNOSIS — N179 Acute kidney failure, unspecified: Secondary | ICD-10-CM | POA: Diagnosis present

## 2024-07-07 DIAGNOSIS — R531 Weakness: Secondary | ICD-10-CM | POA: Diagnosis present

## 2024-07-07 DIAGNOSIS — Z8249 Family history of ischemic heart disease and other diseases of the circulatory system: Secondary | ICD-10-CM | POA: Diagnosis not present

## 2024-07-07 DIAGNOSIS — I5082 Biventricular heart failure: Secondary | ICD-10-CM | POA: Diagnosis present

## 2024-07-07 DIAGNOSIS — E1122 Type 2 diabetes mellitus with diabetic chronic kidney disease: Secondary | ICD-10-CM | POA: Diagnosis present

## 2024-07-07 DIAGNOSIS — I255 Ischemic cardiomyopathy: Secondary | ICD-10-CM | POA: Diagnosis present

## 2024-07-07 DIAGNOSIS — Z794 Long term (current) use of insulin: Secondary | ICD-10-CM | POA: Diagnosis not present

## 2024-07-07 DIAGNOSIS — I5023 Acute on chronic systolic (congestive) heart failure: Secondary | ICD-10-CM | POA: Diagnosis present

## 2024-07-07 DIAGNOSIS — Z87891 Personal history of nicotine dependence: Secondary | ICD-10-CM | POA: Diagnosis not present

## 2024-07-07 DIAGNOSIS — Z951 Presence of aortocoronary bypass graft: Secondary | ICD-10-CM | POA: Diagnosis not present

## 2024-07-07 DIAGNOSIS — Z1152 Encounter for screening for COVID-19: Secondary | ICD-10-CM | POA: Diagnosis not present

## 2024-07-07 DIAGNOSIS — I272 Pulmonary hypertension, unspecified: Secondary | ICD-10-CM | POA: Diagnosis present

## 2024-07-07 DIAGNOSIS — Z8674 Personal history of sudden cardiac arrest: Secondary | ICD-10-CM | POA: Diagnosis not present

## 2024-07-07 DIAGNOSIS — I2510 Atherosclerotic heart disease of native coronary artery without angina pectoris: Secondary | ICD-10-CM | POA: Diagnosis present

## 2024-07-07 DIAGNOSIS — Z7901 Long term (current) use of anticoagulants: Secondary | ICD-10-CM | POA: Diagnosis not present

## 2024-07-07 DIAGNOSIS — N1832 Chronic kidney disease, stage 3b: Secondary | ICD-10-CM | POA: Diagnosis present

## 2024-07-07 DIAGNOSIS — E876 Hypokalemia: Secondary | ICD-10-CM | POA: Diagnosis present

## 2024-07-07 DIAGNOSIS — I509 Heart failure, unspecified: Secondary | ICD-10-CM | POA: Diagnosis present

## 2024-07-07 LAB — BASIC METABOLIC PANEL WITH GFR
Anion gap: 11 (ref 5–15)
BUN: 57 mg/dL — ABNORMAL HIGH (ref 8–23)
CO2: 27 mmol/L (ref 22–32)
Calcium: 8.4 mg/dL — ABNORMAL LOW (ref 8.9–10.3)
Chloride: 104 mmol/L (ref 98–111)
Creatinine, Ser: 2.04 mg/dL — ABNORMAL HIGH (ref 0.61–1.24)
GFR, Estimated: 31 mL/min — ABNORMAL LOW (ref >60)
Glucose, Bld: 120 mg/dL — ABNORMAL HIGH (ref 70–99)
Potassium: 3.4 mmol/L — ABNORMAL LOW (ref 3.5–5.1)
Sodium: 142 mmol/L (ref 135–145)

## 2024-07-07 LAB — GLUCOSE, CAPILLARY
Glucose-Capillary: 114 mg/dL — ABNORMAL HIGH (ref 70–99)
Glucose-Capillary: 162 mg/dL — ABNORMAL HIGH (ref 70–99)
Glucose-Capillary: 93 mg/dL (ref 70–99)
Glucose-Capillary: 185 mg/dL — ABNORMAL HIGH (ref 70–99)

## 2024-07-07 MED ORDER — POTASSIUM CHLORIDE CRYS ER 20 MEQ PO TBCR
40.0000 meq | EXTENDED_RELEASE_TABLET | ORAL | Status: AC
  Administered 2024-07-07 (×2): 40 meq via ORAL
  Filled 2024-07-07 (×2): qty 2

## 2024-07-07 MED ORDER — ORAL CARE MOUTH RINSE: 15.0000 mL | OROMUCOSAL | Status: DC | PRN

## 2024-07-07 NOTE — Progress Notes (Addendum)
 Progress Note    [name]  FMW:[id number] DOB: [birth year]  DOA: 07/06/2024 PCP: [name], MD      Brief Narrative:    Medical records reviewed and are as summarized below:  [name] is a 89 y.o. 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  Symptoms started about 2 weeks prior to admission.  Symptoms were progressive patient became more lethargic.  He was admitted to the hospital for acute exacerbation of chronic systolic CHF.    Assessment/Plan:   Principal Problem:   CHF (congestive heart failure) (HCC) Active Problems:   Acute decompensated heart failure (HCC)     Body mass index is 30.77 kg/m.    Acute on chronic HFrEF: Continue IV Lasix .  Continue carvedilol , hydralazine , isosorbide  mononitrate and Entresto .  Monitor BMP, daily weights and urine output. BNP greater than 4,500. 2D echo in February 2025 showed EF estimated at 25 to 30%   Hypertension: Continue antihypertensives   Mildly elevated troponins, history of CAD s/p CABG and coronary stent: Chart review shows patient has chronically elevated troponins likely from chronic myocardial injury   Hypokalemia: Improving.  Continue potassium repletion.   Paroxysmal atrial fibrillation: Continue carvedilol  and Eliquis    General weakness: Consult PT   Comorbidities include s/p CRT-D permanent pacemaker for history of high-grade AV block, type 2 diabetes mellitus,  Diet Order             Diet heart healthy/carb modified Room service appropriate? Yes; Fluid consistency: Thin  Diet effective now                                   Consultants: None  Procedures: None    Medications:    apixaban   2.5 mg Oral BID   carvedilol   6.25 mg Oral BID WC   ezetimibe   10 mg Oral Daily   furosemide   40 mg Intravenous BID   hydrALAZINE   25 mg Oral BID   insulin  aspart  0-9 Units Subcutaneous TID WC   isosorbide  mononitrate  60 mg Oral Daily   magnesium  oxide  400 mg Oral Daily   pantoprazole   40 mg Oral Daily   rosuvastatin   40 mg Oral Daily   sacubitril -valsartan   1 tablet Oral BID   sodium chloride  flush  3 mL Intravenous Q12H   spironolactone   25 mg Oral Daily   tamsulosin   0.4 mg Oral Daily   Continuous Infusions:  sodium chloride        Anti-infectives (From admission, onward)    None              Family Communication/Anticipated D/C date and plan/Code Status   DVT prophylaxis: apixaban  (ELIQUIS ) tablet 2.5 mg Start: 07/06/24 2200 apixaban  (ELIQUIS ) tablet 2.5 mg     Code Status: Full Code  Family Communication: None Disposition Plan: Plan to discharge home   Status is: Observation The patient will require care spanning > 2 midnights and should be moved to inpatient because: CHF exacerbation       Subjective:   Interval events noted.  Breathing is better.  No chest pain  Objective:  Vitals:   07/07/24 0403 07/07/24 0500 07/07/24 0746 07/07/24 1202  BP: 135/87  119/64 102/61  Pulse: 62  67 64  Resp: 18  17 16   Temp: 98.2 F (36.8 C)  98.1 F (36.7 C) 98 F (36.7 C)  TempSrc:   Oral Oral  SpO2: 100%  100% 100%  Weight:  91.8 kg    Height:       No data found.   Intake/Output Summary (Last 24 hours) at 07/07/2024 1300 Last data filed at 07/07/2024 1225 Gross per 24 hour  Intake 600 ml  Output 3050 ml  Net -2450 ml   Filed Weights   07/06/24 1419 07/07/24 0500  Weight: 93 kg 91.8 kg    Exam:   GEN: NAD SKIN: Warm and dry.  Chronic lump (? lipoma) noted on left lower back and right upper back EYES: No pallor or  icterus ENT: MMM CV: RRR PULM: Bibasilar rales.  No wheezing ABD: soft, ND, NT, +BS CNS: AAO x 3, non focal EXT: Mild bilateral leg pedal edema, no tenderness or erythema       Data Reviewed:   I have personally reviewed following labs and imaging studies:  Labs: Labs show the following:   Basic Metabolic Panel: Recent Labs  Lab 07/06/24 1512 07/07/24 0637  NA 143 142  K 3.2* 3.4*  CL 102 104  CO2 27 27  GLUCOSE 148* 120*  BUN 63* 57*  CREATININE 2.06* 2.04*  CALCIUM  8.6* 8.4*  MG 1.7  --   PHOS 3.1  --    GFR Estimated Creatinine Clearance: 28.6 mL/min (A) (by C-G formula based on SCr of 2.04 mg/dL (H)). Liver Function Tests: No results for input(s): AST, ALT, ALKPHOS, BILITOT, PROT, ALBUMIN  in the last 168 hours. No results for input(s): LIPASE, AMYLASE in the last 168 hours. No results for input(s): AMMONIA in the last 168 hours. Coagulation profile Recent Labs  Lab 07/06/24 1512  INR 1.3*    CBC: Recent Labs  Lab 07/06/24 1512  WBC 3.8*  HGB 13.2  HCT 41.8  MCV 89.5  PLT 101*   Cardiac Enzymes: No results for input(s): CKTOTAL, CKMB, CKMBINDEX, TROPONINI in the last 168 hours. BNP (last 3 results) Recent Labs    08/09/23 1058  PROBNP 6,649*   CBG: Recent Labs  Lab 07/06/24 2129 07/07/24 0744 07/07/24 1201  GLUCAP 159* 114* 162*   D-Dimer: No results for input(s): DDIMER in the last 72 hours. Hgb A1c: No results for input(s): HGBA1C in the last 72 hours. Lipid Profile: No results for input(s): CHOL, HDL, LDLCALC, TRIG, CHOLHDL, LDLDIRECT in the last 72 hours. Thyroid function studies: No results for input(s): TSH, T4TOTAL, T3FREE, THYROIDAB in the last 72 hours.  Invalid input(s): FREET3 Anemia work up: No results for input(s): VITAMINB12, FOLATE, FERRITIN, TIBC, IRON, RETICCTPCT in the last 72 hours. Sepsis Labs: Recent Labs  Lab 07/06/24 1512  WBC 3.8*     Microbiology Recent Results (from the past 240 hours)  SARS Coronavirus 2 by RT PCR (hospital order, performed in [facility] hospital lab) *cepheid single result test* Anterior Nasal Swab     Status: None   Collection Time: 07/06/24  5:03 PM   Specimen: Anterior Nasal Swab  Result Value Ref Range Status   SARS Coronavirus 2 by RT PCR NEGATIVE NEGATIVE Final    Comment: (NOTE) SARS-CoV-2 target nucleic acids are NOT DETECTED.  The SARS-CoV-2 RNA is generally detectable in upper and lower respiratory specimens during the acute  phase of infection. The lowest concentration of SARS-CoV-2 viral copies this assay can detect is 250 copies / mL. A negative result does not preclude SARS-CoV-2 infection and should not be used as the sole basis for treatment or other patient management decisions.  A negative result may occur with improper specimen collection / handling, submission of specimen other than nasopharyngeal swab, presence of viral mutation(s) within the areas targeted by this assay, and inadequate number of viral copies (<250 copies / mL). A negative result must be combined with clinical observations, patient history, and epidemiological information.  Fact Sheet for Patients:   RoadLapTop.co.za  Fact Sheet for Healthcare Providers: http://kim-miller.com/  This test is not yet approved or  cleared by the [place]  FDA and has been authorized for detection and/or diagnosis of SARS-CoV-2 by FDA under an Emergency Use Authorization (EUA).  This EUA will remain in effect (meaning this test can be used) for the duration of the COVID-19 declaration under Section 564(b)(1) of the Act, 21 U.S.C. section 360bbb-3(b)(1), unless the authorization is terminated or revoked sooner.  Performed at [facility], [address] [place], [place] [address]     Procedures and diagnostic studies:  DG Chest 1 View Result Date:  07/07/2024 EXAM: 1 VIEW XRAY OF THE CHEST 07/07/2024 07:01:00 AM COMPARISON: 07/06/2024 CLINICAL HISTORY: CHF (congestive heart failure) FINDINGS: LUNGS AND PLEURA: Low lung volumes. Mild pulmonary edema. Possible small bilateral pleural effusions. HEART AND MEDIASTINUM: Stable mild cardiomegaly. CABG markers noted. Left atrial clip noted. Stable left subclavian dual lead pacemaker. BONES AND SOFT TISSUES: Sternotomy wires noted. Unchanged right posterior chest wall calcified mass projecting over the right upper lobe. No acute osseous abnormality. IMPRESSION: 1. Mild pulmonary edema and possible small bilateral pleural effusions. 2. Stable mild cardiomegaly, CABG markers, and left atrial clip. Electronically signed by: Waddell Calk MD 07/07/2024 07:24 AM EDT RP Workstation: [id number]   DG Chest Portable 1 View Result Date: 07/06/2024 EXAM: 1 VIEW XRAY OF THE CHEST 07/06/2024 03:09:00 PM COMPARISON: 05/09/2024 CLINICAL HISTORY: Dyspnea. Pt to ED via [facility] from home. Pt states he was at family reunion over the weekend and has not felt well since. Pt states urinary frequency, on fluid pill. Pt states chest tightness and shortness of breath. FINDINGS: LUNGS AND PLEURA: Streaky opacities at lung bases, most likely atelectasis. Blunted costophrenic angles suggesting possible trace pleural effusions. HEART AND MEDIASTINUM: Stable mild cardiomegaly. Left atrial appendage clip noted. CABG markers noted. Stable left subclavian dual lead pacemaker. BONES AND SOFT TISSUES: Calcifications in the right subscapularis are again noted. Sternotomy wires noted. IMPRESSION: 1. Stable mild cardiomegaly with left atrial appendage clip, CABG markers, and left subclavian dual lead pacemaker. 2. Streaky opacities at lung bases, most likely atelectasis. 3. Blunted costophrenic angles suggesting possible trace pleural effusions. Electronically signed by: Lonni Necessary MD 07/06/2024 03:16 PM EDT RP Workstation: [id number]                LOS: 0 days   Liel Rudden  [facility]   Pager on [email or web address]. If 7PM-7AM, please contact night-coverage at [email or web address]     07/07/2024, 1:00 PM

## 2024-07-07 NOTE — Plan of Care (Signed)

## 2024-07-07 NOTE — Plan of Care (Signed)

## 2024-07-07 NOTE — Evaluation (Signed)
 Physical Therapy Evaluation Patient Details Name: [name] MRN: [id number] DOB: [birth year] Today's Date: 07/07/2024  History of Present Illness  Pt is an 89 y/o M admitted on 07/06/24 after presenting with worsening SOB & cough beginning 2 weeks ago. Pt is being treated for acute on chronic HFrEF. 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 Pt reports prior to admission he was living with wife & daughter in 1 level home with ramped entry, ambulatory with RW. On this date, pt exits bed on R side with mod I with hospital bed features. Pt transfers sit>stand with supervision & ambulates 1 lap around unit with RW & CGA, no overt LOB. Pt is doing well in regards to mobility. Will continue to follow pt acutely to address endurance, balance.        If plan is discharge home, recommend the following: A little help with walking and/or transfers;A little help with bathing/dressing/bathroom;Assistance with cooking/housework;Assist for transportation;Help with stairs or ramp for entrance   Can travel by private vehicle        Equipment Recommendations None recommended by PT  Recommendations for Other Services       Functional Status Assessment Patient has had a recent decline in their functional status and demonstrates the ability to make significant improvements in function in a reasonable and predictable amount of time.     Precautions / Restrictions Precautions Precautions: Fall Restrictions Weight Bearing Restrictions Per Provider Order: No      Mobility  Bed Mobility Overal bed mobility: Modified Independent, Needs Assistance Bed Mobility: Supine to Sit     Supine to sit: Modified independent (Device/Increase time), HOB elevated, Used rails (exit R side of bed)           Transfers Overall transfer level: Needs assistance Equipment used: Rolling walker (2 wheels) Transfers: Sit to/from Stand Sit to Stand: Supervision           General transfer comment: sit>stand from EOB, cuing to push to standing with BUE    Ambulation/Gait Ambulation/Gait assistance: Contact guard assist Gait Distance (Feet): 160 Feet Assistive device: Rolling walker (2 wheels) Gait Pattern/deviations: Decreased step length - right, Decreased step length - left, Decreased stride length Gait velocity: decreased     General Gait Details: pushes RW slightly out in front of him  Stairs            Wheelchair Mobility     Tilt Bed    Modified Rankin (Stroke Patients Only)       Balance Overall balance assessment: Needs assistance Sitting-balance support: Feet supported Sitting balance-[name]: Fair Sitting balance - Comments: supervision static sitting   Standing balance support: Bilateral upper extremity supported, During functional activity, Reliant on assistive device for balance Standing balance-[name]: Fair                               Pertinent Vitals/Pain Pain Assessment Pain Assessment: Faces Faces Pain Scale: Hurts little more Pain Location: HA Pain Descriptors / Indicators: Headache Pain Intervention(s): RN gave pain meds during session, Patient requesting pain meds-RN notified    Home Living Family/patient expects to be discharged to:: Private residence Living Arrangements: Spouse/significant other;Children Available Help at Discharge: Family;Available 24 hours/day Type of Home: House Home Access:  Ramped entrance       Home Layout: One level Home Equipment: Cane - single Librarian, academic (2 wheels);Shower seat;Grab bars - toilet;Grab bars - tub/shower      Prior Function Prior Level of Function : Independent/Modified Independent             Mobility Comments: ambulatory with RW ADLs Comments: PRN  assistance for bathing     Extremity/Trunk Assessment   Upper Extremity Assessment Upper Extremity Assessment: Overall WFL for tasks assessed    Lower Extremity Assessment Lower Extremity Assessment: Overall WFL for tasks assessed;Generalized weakness    Cervical / Trunk Assessment Cervical / Trunk Assessment: Kyphotic  Communication   Communication Communication: Impaired Factors Affecting Communication: Reduced clarity of speech    Cognition Arousal: Alert Behavior During Therapy: WFL for tasks assessed/performed   PT - Cognitive impairments: No apparent impairments                         Following commands: Intact       Cueing Cueing Techniques: Verbal cues     General Comments General comments (skin integrity, edema, etc.): Nurse weaned pt to room air upon PT arrival. SpO2 95% on room air after gait. HR in 80s bpm.    Exercises     Assessment/Plan    PT Assessment Patient needs continued PT services  PT Problem List Decreased strength;Decreased activity tolerance;Decreased balance;Decreased mobility;Decreased safety awareness;Decreased knowledge of use of DME       PT Treatment Interventions DME instruction;Balance training;Neuromuscular re-education;Gait training;Functional mobility training;Patient/family education;Therapeutic activities;Therapeutic exercise    PT Goals (Current goals can be found in the Care Plan section)  Acute Rehab PT Goals Patient Stated Goal: none stated PT Goal Formulation: With patient Time For Goal Achievement: 07/21/24 Potential to Achieve Goals: Good    Frequency Min 2X/week     Co-evaluation               AM-PAC PT 6 Clicks Mobility  Outcome Measure Help needed turning from your back to your side while in a flat bed without using bedrails?: None Help needed moving from lying on your back to sitting on the side of a flat bed without using bedrails?: A Little Help needed moving to and from a bed to a  chair (including a wheelchair)?: A Little Help needed standing up from a chair using your arms (e.g., wheelchair or bedside chair)?: A Little Help needed to walk in hospital room?: A Little Help needed climbing 3-5 steps with a railing? : A Little 6 Click Score: 19    End of Session   Activity Tolerance: Patient tolerated treatment well Patient left: with chair alarm set;in chair;with call bell/phone within reach Nurse Communication: Mobility status PT Visit Diagnosis: Muscle weakness (generalized) (M62.81);Other abnormalities of gait and mobility (R26.89)    Time: 8781-8765 PT Time Calculation (min) (ACUTE ONLY): 16 min   Charges:   PT Evaluation $PT Eval Moderate Complexity: 1 Mod   PT General Charges $$ ACUTE PT VISIT: 1 Visit         Richerd Pinal, PT, DPT 07/07/24, 12:40 PM   Richerd CHRISTELLA Pinal 07/07/2024, 12:39 PM

## 2024-07-07 NOTE — Care Management Obs Status (Signed)
 MEDICARE OBSERVATION STATUS NOTIFICATION   Patient Details  Name: [name] MRN: [id number] Date of Birth: [birth year]   Medicare Observation Status Notification Given:  Yes    [name] 07/07/2024, 1:00 PM

## 2024-07-08 DIAGNOSIS — I5023 Acute on chronic systolic (congestive) heart failure: Secondary | ICD-10-CM | POA: Diagnosis not present

## 2024-07-08 LAB — HEMOGLOBIN A1C
Hgb A1c MFr Bld: 7.3 % — ABNORMAL HIGH (ref 4.8–5.6)
Mean Plasma Glucose: 163 mg/dL

## 2024-07-08 LAB — GLUCOSE, CAPILLARY
Glucose-Capillary: 166 mg/dL — ABNORMAL HIGH (ref 70–99)
Glucose-Capillary: 197 mg/dL — ABNORMAL HIGH (ref 70–99)

## 2024-07-08 LAB — BASIC METABOLIC PANEL WITH GFR
Anion gap: 8 (ref 5–15)
BUN: 61 mg/dL — ABNORMAL HIGH (ref 8–23)
CO2: 30 mmol/L (ref 22–32)
Calcium: 8.2 mg/dL — ABNORMAL LOW (ref 8.9–10.3)
Chloride: 104 mmol/L (ref 98–111)
Creatinine, Ser: 2 mg/dL — ABNORMAL HIGH (ref 0.61–1.24)
GFR, Estimated: 32 mL/min — ABNORMAL LOW (ref >60)
Glucose, Bld: 161 mg/dL — ABNORMAL HIGH (ref 70–99)
Potassium: 3.7 mmol/L (ref 3.5–5.1)
Sodium: 142 mmol/L (ref 135–145)

## 2024-07-08 MED ORDER — TORSEMIDE 20 MG PO TABS
60.0000 mg | ORAL_TABLET | Freq: Every day | ORAL | Status: DC
  Administered 2024-07-08: 60 mg via ORAL
  Filled 2024-07-08 (×2): qty 3

## 2024-07-08 MED ORDER — TORSEMIDE 20 MG PO TABS: 60.0000 mg | ORAL_TABLET | Freq: Every day | ORAL | Status: DC

## 2024-07-08 NOTE — Plan of Care (Signed)

## 2024-07-08 NOTE — TOC Initial Note (Signed)
 Transition of Care [facility]) - Initial/Assessment Note    Patient Details  Name: [name] MRN: [id number] Date of Birth: 1935-01-01  [facility] CM/SW Contact:    [place] L Donaciano Range, LCSW Phone Number: 07/08/2024, 10:45 AM  Clinical Narrative:                  Chart reviewed. Patient in need of Home Health services. [facility] indicated that patient received HHA in July from [facility].   CSW spoke with patient who advised that he wanted to discharge home today. He advised that he wanted HHA. CSW advised that per [facility] he received [facility] in July. Patient is agreeable to services being set up with [facility] again.   Attending asked to place orders for RN and PT. CSW spoke to [place] who advised that patient can receive services.         Patient Goals and CMS Choice            Expected Discharge Plan and Services         Expected Discharge Date: 07/08/24                                    Prior Living Arrangements/Services                       Activities of Daily Living      Permission Sought/Granted                  Emotional Assessment              Admission diagnosis:  CHF (congestive heart failure) (HCC) [I50.9] Chest tightness [R07.89] Dyspnea, unspecified type [R06.00] Patient Active Problem List   Diagnosis Date Noted   CHF (congestive heart failure) (HCC) 07/06/2024   Acute on chronic HFrEF (heart failure with reduced ejection fraction) (HCC) 03/24/2024   Scrotal edema 03/24/2024   Acute renal failure superimposed on stage 3b chronic kidney disease (HCC) 03/24/2024   Shortness of breath 12/24/2023   COVID-19 12/23/2023   CHF exacerbation (HCC) 12/23/2023   ACS (acute coronary syndrome) (HCC) 06/03/2023   Chronic systolic CHF (congestive heart failure) (HCC) 06/03/2023   Cardiac resynchronization therapy pacemaker (CRT-P) in place 05/10/2023   Acute on chronic congestive heart failure (HCC)  03/09/2023   Paroxysmal A-fib (HCC) 03/09/2023   Hypomagnesemia 03/09/2023   Hypercoagulable state due to paroxysmal atrial fibrillation (HCC) 02/08/2023   Paroxysmal atrial fibrillation (HCC) 02/08/2023   Constipation 12/19/2022   Chest pain 12/19/2022   Hypertensive heart disease with heart failure (HCC) 12/18/2022   Thrombocytopenia (HCC) 12/17/2022   CKD stage 3b, GFR 30-44 ml/min (HCC) 12/17/2022   Obesity (BMI 30-39.9) 12/17/2022   Hypokalemia 12/17/2022   Ischemic cardiomyopathy 12/17/2022   Hypertensive urgency 03/29/2022   Dyslipidemia 03/29/2022   GERD without esophagitis 03/29/2022   Type 2 diabetes mellitus with hyperlipidemia (HCC) 03/29/2022   Elevated troponin 03/29/2022   Acute on chronic systolic CHF (congestive heart failure) (HCC) 01/04/2022   Dilated cardiomyopathy (HCC) 10/30/2021   Acute decompensated heart failure (HCC) 10/27/2021   Complete heart block (HCC) 10/12/2021   Junctional bradycardia    Acute on chronic diastolic CHF (congestive heart failure) (HCC) 08/26/2021   CAD (coronary artery disease)    HLD (hyperlipidemia)    GERD (gastroesophageal reflux disease)    Type II diabetes mellitus with renal manifestations (HCC)  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   CAD, multiple vessel 08/25/2020   Non-ST elevation (NSTEMI) myocardial infarction (HCC) 08/21/2020   History of cardiac arrest 08/21/2020   HFrEF (heart failure with reduced ejection fraction) (HCC)    History of sustained ventricular fibrillation 08/18/2020   Acute respiratory failure with hypoxia (HCC)    Chronic diastolic heart failure (HCC)    Urinary tract infection 07/26/2019   HTN (hypertension) 07/26/2019   Diabetes (HCC) 07/26/2019   Unstable angina (HCC) 12/03/2008   Status post percutaneous transluminal coronary angioplasty 10/08/2008   Malignant neoplasm of prostate (HCC) 10/05/2005   PCP:  [name], MD Pharmacy:   [facility] [phone] [address], [place] - [address]  [place] [address] Phone: [phone] Fax: [phone]  [facility] [address] [place] [address] Phone: [phone] Fax: [phone]     Social Drivers of Health (SDOH) Social History: SDOH Screenings   Food Insecurity: No Food Insecurity (07/07/2024)  Housing: Low Risk  (07/07/2024)  Transportation Needs: No Transportation Needs (07/07/2024)  Utilities: Not At Risk (07/07/2024)  Depression (PHQ2-9): Low Risk  (04/07/2022)  Financial Resource Strain: Low Risk  (06/29/2021)   Received from [facility]  Physical Activity: Inactive (07/27/2019)  Social Connections: Socially Integrated (07/07/2024)  Stress: No Stress Concern Present (07/27/2019)  Tobacco Use: Medium Risk (07/06/2024)   SDOH Interventions:     Readmission Risk Interventions    03/26/2024    4:09 PM 03/10/2023   12:47 PM 03/29/2022    1:18 PM  Readmission Risk Prevention Plan  Transportation Screening Complete Complete Complete  PCP or Specialist Appt within 3-5 Days  Complete Complete  HRI or Home Care Consult   --  Social Work Consult for Recovery Care Planning/Counseling  Complete --  Palliative Care Screening  Not Applicable Not Applicable  Medication Review Oceanographer) Complete Complete Complete  PCP or Specialist appointment within 3-5 days of discharge Complete    HRI or Home Care Consult Complete    SW Recovery Care/Counseling Consult Complete    Palliative Care Screening Not Applicable    Skilled Nursing Facility Not Applicable

## 2024-07-08 NOTE — Plan of Care (Signed)

## 2024-07-08 NOTE — Discharge Summary (Signed)
 Physician Discharge Summary   Patient: [name] MRN: [id number] DOB: [birth year]  Admit date:     07/06/2024  Discharge date: 07/08/24  Discharge Physician: AIDA CHO   PCP: [name], MD   Recommendations at discharge:   Follow-up with PCP in 1 week Follow-up with the CHF clinic and your cardiologist as soon as possible  Discharge Diagnoses: Principal Problem:   CHF (congestive heart failure) (HCC) Active Problems:   Acute decompensated heart failure (HCC)  Resolved Problems:   * No resolved hospital problems. *  Hospital Course:  [name] is a 89 y.o. 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  Symptoms started about 2 weeks prior to admission.  Symptoms were progressive patient became more lethargic.   He was admitted to the hospital for acute exacerbation of chronic systolic CHF.      Assessment and Plan:   Acute on chronic HFrEF: Improved.  Resume torsemide  and metolazone  at discharge.  Continue carvedilol , hydralazine , isosorbide  mononitrate and Entresto .  BNP greater than 4,500. 2D echo in February 2025 showed EF estimated at 25 to 30%     Hypertension: Continue antihypertensives     Mildly elevated troponins, history of CAD s/p CABG and coronary stent: Chart review shows patient has chronically elevated troponins likely from chronic myocardial injury     Hypokalemia: Improved.  Continue potassium repletion at discharge     Paroxysmal atrial fibrillation: Continue carvedilol  and Eliquis      General weakness: PT recommended home health therapy     Comorbidities include s/p CRT-D permanent  pacemaker for history of high-grade AV block, type 2 diabetes mellitus,    His condition has improved and he is deemed stable for discharge home today.  His condition has improved and he is deemed stable for discharge to home today.       Consultants: None Procedures performed: None Disposition: Home health Diet recommendation:  Discharge Diet Orders (From admission, onward)     Start     Ordered   07/08/24 0000  Diet - low sodium heart healthy        07/08/24 0829           Cardiac diet DISCHARGE MEDICATION: Allergies as of 07/08/2024       Reactions   Angiotensin Receptor Blockers    hyperkalemia   Metformin Diarrhea        Medication List     STOP taking these medications    Repatha SureClick 140 MG/ML Soaj Generic drug: Evolocumab       TAKE these medications    acetaminophen  500 MG tablet Commonly known as: TYLENOL  Take 1,000 mg by mouth every 6 (six) hours as needed for mild pain, fever or headache.   acyclovir  800 MG tablet Commonly known as: ZOVIRAX  Take 800 mg by mouth daily.   apixaban  2.5 MG Tabs tablet Commonly known as: Eliquis  Take 1 tablet (2.5 mg total) by mouth 2 (two) times daily.   Basaglar  KwikPen 100 UNIT/ML Inject 15 Units into the skin at bedtime.   carvedilol  6.25 MG tablet Commonly known as: COREG  TAKE 1 TABLET BY MOUTH 2 TIMES DAILY WITH A MEAL.   dapagliflozin  propanediol 10 MG Tabs tablet  Commonly known as: Farxiga  Take 1 tablet (10 mg total) by mouth daily.   Entresto  49-51 MG Generic drug: sacubitril -valsartan  Take 1 tablet by mouth 2 (two) times daily.   ezetimibe  10 MG tablet Commonly known as: ZETIA  TAKE 1 TABLET BY MOUTH EVERY DAY   hydrALAZINE  25 MG tablet Commonly known as: APRESOLINE  Take 25 mg by mouth 2 (two) times daily.   isosorbide  mononitrate 60 MG 24 hr tablet Commonly known as: IMDUR  Take 60 mg by mouth daily.   magnesium  oxide 400 MG tablet Commonly known as: MAG-OX Take 1 tablet  (400 mg total) by mouth daily.   metolazone  2.5 MG tablet Commonly known as: ZAROXOLYN  Take 2.5 mg by mouth 2 (two) times a week. Every Tuesday and Friday   pantoprazole  40 MG tablet Commonly known as: PROTONIX  Take 40 mg by mouth daily.   polyethylene glycol 17 g packet Commonly known as: MIRALAX  / GLYCOLAX  Take 17 g by mouth daily as needed for moderate constipation.   potassium chloride  SA 20 MEQ tablet Commonly known as: KLOR-CON  M Take 2 tablets (40 mEq total) by mouth daily.   rosuvastatin  40 MG tablet Commonly known as: CRESTOR  Take 1 tablet (40 mg total) by mouth daily.   spironolactone  25 MG tablet Commonly known as: ALDACTONE  Take 1 tablet (25 mg total) by mouth daily.   tamsulosin  0.4 MG Caps capsule Commonly known as: FLOMAX  Take 0.4 mg by mouth daily.   Torsemide  60 MG Tabs Take 60 mg by mouth daily.        Discharge Exam: Filed Weights   07/06/24 1419 07/07/24 0500 07/08/24 0500  Weight: 93 kg 91.8 kg 87.9 kg   GEN: NAD SKIN: Warm and dry EYES: No pallor or icterus ENT: MMM CV: RRR PULM: CTA B ABD: soft, ND, NT, +BS CNS: AAO x 3, non focal EXT: No edema or tenderness   Condition at discharge: good  The results of significant diagnostics from this hospitalization (including imaging, microbiology, ancillary and laboratory) are listed below for reference.   Imaging Studies: DG Chest 1 View Result Date: 07/07/2024 EXAM: 1 VIEW XRAY OF THE CHEST 07/07/2024 07:01:00 AM COMPARISON: 07/06/2024 CLINICAL HISTORY: CHF (congestive heart failure) FINDINGS: LUNGS AND PLEURA: Low lung volumes. Mild pulmonary edema. Possible small bilateral pleural effusions. HEART AND MEDIASTINUM: Stable mild cardiomegaly. CABG markers noted. Left atrial clip noted. Stable left subclavian dual lead pacemaker. BONES AND SOFT TISSUES: Sternotomy wires noted. Unchanged right posterior chest wall calcified mass projecting over the right upper lobe. No acute osseous abnormality.  IMPRESSION: 1. Mild pulmonary edema and possible small bilateral pleural effusions. 2. Stable mild cardiomegaly, CABG markers, and left atrial clip. Electronically signed by: Waddell Calk MD 07/07/2024 07:24 AM EDT RP Workstation: [id number]   DG Chest Portable 1 View Result Date: 07/06/2024 EXAM: 1 VIEW XRAY OF THE CHEST 07/06/2024 03:09:00 PM COMPARISON: 05/09/2024 CLINICAL HISTORY: Dyspnea. Pt to ED via [facility] from home. Pt states he was at family reunion over the weekend and has not felt well since. Pt states urinary frequency, on fluid pill. Pt states chest tightness and shortness of breath. FINDINGS: LUNGS AND PLEURA: Streaky opacities at lung bases, most likely atelectasis. Blunted costophrenic angles suggesting possible trace pleural effusions. HEART AND MEDIASTINUM: Stable mild cardiomegaly. Left atrial appendage clip noted. CABG markers noted. Stable left subclavian dual lead pacemaker. BONES AND SOFT TISSUES: Calcifications in the right subscapularis are again noted. Sternotomy wires noted. IMPRESSION: 1. Stable mild cardiomegaly with left atrial appendage clip, CABG markers,  and left subclavian dual lead pacemaker. 2. Streaky opacities at lung bases, most likely atelectasis. 3. Blunted costophrenic angles suggesting possible trace pleural effusions. Electronically signed by: Lonni Necessary MD 07/06/2024 03:16 PM EDT RP Workstation: [id number]    Microbiology: Results for orders placed or performed during the hospital encounter of 07/06/24  SARS Coronavirus 2 by RT PCR (hospital order, performed in [facility] hospital lab) *cepheid single result test* Anterior Nasal Swab     Status: None   Collection Time: 07/06/24  5:03 PM   Specimen: Anterior Nasal Swab  Result Value Ref Range Status   SARS Coronavirus 2 by RT PCR NEGATIVE NEGATIVE Final    Comment: (NOTE) SARS-CoV-2 target nucleic acids are NOT DETECTED.  The SARS-CoV-2 RNA is generally detectable in upper and lower respiratory  specimens during the acute phase of infection. The lowest concentration of SARS-CoV-2 viral copies this assay can detect is 250 copies / mL. A negative result does not preclude SARS-CoV-2 infection and should not be used as the sole basis for treatment or other patient management decisions.  A negative result may occur with improper specimen collection / handling, submission of specimen other than nasopharyngeal swab, presence of viral mutation(s) within the areas targeted by this assay, and inadequate number of viral copies (<250 copies / mL). A negative result must be combined with clinical observations, patient history, and epidemiological information.  Fact Sheet for Patients:   RoadLapTop.co.za  Fact Sheet for Healthcare Providers: http://kim-miller.com/  This test is not yet approved or  cleared by the [place]  FDA and has been authorized for detection and/or diagnosis of SARS-CoV-2 by FDA under an Emergency Use Authorization (EUA).  This EUA will remain in effect (meaning this test can be used) for the duration of the COVID-19 declaration under Section 564(b)(1) of the Act, 21 U.S.C. section 360bbb-3(b)(1), unless the authorization is terminated or revoked sooner.  Performed at [facility], [address] [place], [place] [address]     Labs: CBC: Recent Labs  Lab 07/06/24 1512  WBC 3.8*  HGB 13.2  HCT 41.8  MCV 89.5  PLT 101*   Basic Metabolic Panel: Recent Labs  Lab 07/06/24 1512 07/07/24 0637 07/08/24 0338  NA 143 142 142  K 3.2* 3.4* 3.7  CL 102 104 104  CO2 27 27 30   GLUCOSE 148* 120* 161*  BUN 63* 57* 61*  CREATININE 2.06* 2.04* 2.00*  CALCIUM  8.6* 8.4* 8.2*  MG 1.7  --   --   PHOS 3.1  --   --    Liver Function Tests: No results for input(s): AST, ALT, ALKPHOS, BILITOT, PROT, ALBUMIN  in the last 168 hours. CBG: Recent Labs  Lab 07/07/24 0744 07/07/24 1201  07/07/24 1627 07/07/24 2113 07/08/24 0753  GLUCAP 114* 162* 93 185* 166*    Discharge time spent: greater than 30 minutes.  Signed: AIDA CHO, MD [facility] 07/08/2024

## 2024-07-10 ENCOUNTER — Ambulatory Visit (INDEPENDENT_AMBULATORY_CARE_PROVIDER_SITE_OTHER): Payer: Medicaid Other

## 2024-07-10 DIAGNOSIS — I5022 Chronic systolic (congestive) heart failure: Secondary | ICD-10-CM

## 2024-07-12 LAB — CUP PACEART REMOTE DEVICE CHECK
Battery Remaining Longevity: 48 mo
Battery Remaining Percentage: 56 %
Battery Voltage: 2.98 V
Brady Statistic AP VP Percent: 64 %
Brady Statistic AP VS Percent: 1 %
Brady Statistic AS VP Percent: 34 %
Brady Statistic AS VS Percent: 1 %
Brady Statistic RA Percent Paced: 60 %
Date Time Interrogation Session: 20250902034717
Implantable Lead Connection Status: 753985
Implantable Lead Connection Status: 753985
Implantable Lead Connection Status: 753985
Implantable Lead Implant Date: 20221205
Implantable Lead Implant Date: 20221205
Implantable Lead Implant Date: 20221205
Implantable Lead Location: 753858
Implantable Lead Location: 753859
Implantable Lead Location: 753860
Implantable Lead Model: 3830
Implantable Pulse Generator Implant Date: 20221205
Lead Channel Impedance Value: 410 Ohm
Lead Channel Impedance Value: 410 Ohm
Lead Channel Impedance Value: 490 Ohm
Lead Channel Pacing Threshold Amplitude: 0.5 V
Lead Channel Pacing Threshold Amplitude: 0.75 V
Lead Channel Pacing Threshold Amplitude: 2 V
Lead Channel Pacing Threshold Pulse Width: 0.5 ms
Lead Channel Pacing Threshold Pulse Width: 0.5 ms
Lead Channel Pacing Threshold Pulse Width: 0.5 ms
Lead Channel Sensing Intrinsic Amplitude: 2.5 mV
Lead Channel Sensing Intrinsic Amplitude: 7 mV
Lead Channel Setting Pacing Amplitude: 2 V
Lead Channel Setting Pacing Amplitude: 2 V
Lead Channel Setting Pacing Amplitude: 3.5 V
Lead Channel Setting Pacing Pulse Width: 0.5 ms
Lead Channel Setting Pacing Pulse Width: 0.5 ms
Lead Channel Setting Sensing Sensitivity: 2 mV
Pulse Gen Model: 3222
Pulse Gen Serial Number: 3901949

## 2024-07-14 ENCOUNTER — Ambulatory Visit: Payer: Self-pay | Admitting: Cardiology

## 2024-07-17 NOTE — Progress Notes (Signed)
 Remote PPM Transmission

## 2024-07-24 ENCOUNTER — Other Ambulatory Visit (HOSPITAL_COMMUNITY): Payer: Self-pay | Admitting: Internal Medicine

## 2024-07-24 ENCOUNTER — Other Ambulatory Visit: Payer: Self-pay | Admitting: Cardiology

## 2024-07-24 DIAGNOSIS — I2510 Atherosclerotic heart disease of native coronary artery without angina pectoris: Secondary | ICD-10-CM

## 2024-07-30 ENCOUNTER — Telehealth: Payer: Self-pay | Admitting: Family
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
## 2024-07-30 NOTE — Telephone Encounter (Signed)
 Called to confirm/remind patient of their appointment at the Advanced [facility] Failure Clinic on 07/31/24.   Appointment:   [] Confirmed  [x] Left mess   [] No answer/No voice mail  [] VM Full/unable to leave message  [] Phone not in service  Patient reminded to bring all medications and/or complete list.  Confirmed patient has transportation. Gave directions, instructed to utilize valet parking.

## 2024-07-31 ENCOUNTER — Encounter: Admitting: Family

## 2024-08-03 ENCOUNTER — Telehealth: Payer: Self-pay | Admitting: Family

## 2024-08-03 NOTE — Telephone Encounter (Signed)
 Called to confirm/remind patient of their appointment at the Advanced [facility] Failure Clinic on 08/06/24.   Appointment:   [] Confirmed  [x] Left mess   [] No answer/No voice mail  [] VM Full/unable to leave message  [] Phone not in service  Patient reminded to bring all medications and/or complete list.  Confirmed patient has transportation. Gave directions, instructed to utilize valet parking.
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
## 2024-08-06 ENCOUNTER — Encounter: Admitting: Family

## 2024-08-10 ENCOUNTER — Telehealth: Payer: Self-pay | Admitting: Family

## 2024-08-10 NOTE — Telephone Encounter (Signed)
 Called to confirm/remind patient of their appointment at the Advanced [facility] Failure Clinic on 08/13/24.   Appointment:   [x] Confirmed  [] Left mess   [] No answer/No voice mail  [] VM Full/unable to leave message  [] Phone not in service  Patient reminded to bring all medications and/or complete list.  Confirmed patient has transportation. Gave directions, instructed to utilize valet parking.

## 2024-08-13 ENCOUNTER — Encounter: Payer: Self-pay | Admitting: Family

## 2024-08-13 ENCOUNTER — Ambulatory Visit: Attending: Family | Admitting: Family

## 2024-08-13 VITALS — BP 120/76 | HR 66 | Wt 208.2 lb

## 2024-08-13 DIAGNOSIS — I255 Ischemic cardiomyopathy: Secondary | ICD-10-CM | POA: Insufficient documentation

## 2024-08-13 DIAGNOSIS — Z7901 Long term (current) use of anticoagulants: Secondary | ICD-10-CM | POA: Diagnosis not present

## 2024-08-13 DIAGNOSIS — Z951 Presence of aortocoronary bypass graft: Secondary | ICD-10-CM | POA: Insufficient documentation

## 2024-08-13 DIAGNOSIS — Z7984 Long term (current) use of oral hypoglycemic drugs: Secondary | ICD-10-CM | POA: Insufficient documentation

## 2024-08-13 DIAGNOSIS — I130 Hypertensive heart and chronic kidney disease with heart failure and stage 1 through stage 4 chronic kidney disease, or unspecified chronic kidney disease: Secondary | ICD-10-CM | POA: Insufficient documentation

## 2024-08-13 DIAGNOSIS — R5383 Other fatigue: Secondary | ICD-10-CM | POA: Insufficient documentation

## 2024-08-13 DIAGNOSIS — R600 Localized edema: Secondary | ICD-10-CM | POA: Insufficient documentation

## 2024-08-13 DIAGNOSIS — I480 Paroxysmal atrial fibrillation: Secondary | ICD-10-CM | POA: Diagnosis not present

## 2024-08-13 DIAGNOSIS — I5022 Chronic systolic (congestive) heart failure: Secondary | ICD-10-CM | POA: Diagnosis not present

## 2024-08-13 DIAGNOSIS — Z79899 Other long term (current) drug therapy: Secondary | ICD-10-CM | POA: Insufficient documentation

## 2024-08-13 DIAGNOSIS — I252 Old myocardial infarction: Secondary | ICD-10-CM | POA: Insufficient documentation

## 2024-08-13 DIAGNOSIS — I2510 Atherosclerotic heart disease of native coronary artery without angina pectoris: Secondary | ICD-10-CM

## 2024-08-13 DIAGNOSIS — Z950 Presence of cardiac pacemaker: Secondary | ICD-10-CM | POA: Insufficient documentation

## 2024-08-13 DIAGNOSIS — E1122 Type 2 diabetes mellitus with diabetic chronic kidney disease: Secondary | ICD-10-CM | POA: Diagnosis not present

## 2024-08-13 DIAGNOSIS — R0602 Shortness of breath: Secondary | ICD-10-CM | POA: Insufficient documentation

## 2024-08-13 DIAGNOSIS — N183 Chronic kidney disease, stage 3 unspecified: Secondary | ICD-10-CM

## 2024-08-13 NOTE — Patient Instructions (Signed)
   Lab Work:  Go downstairs to [place] on LOWER LEVEL to have your blood work completed.  We will only call you if the results are abnormal or if the provider would like to make medication changes.  No news is good news.    Special Instructions // Education:  Please call us  at [phone] and let us  know if you are taking HYDRALAZINE , ISOSORBIDE  OR ZETIA .  Follow-Up in: Please follow up with the Advanced Heart Failure Clinic in 1 month with Dr. Rolan.    Thank you for choosing [facility].    At the Advanced Heart Failure Clinic, you and your health needs are our priority. We have a designated team specialized in the treatment of Heart Failure. This Care Team includes your primary Heart Failure Specialized Cardiologist (physician), Advanced Practice Providers (APPs- Physician Assistants and Nurse Practitioners), and Pharmacist who all work together to provide you with the care you need, when you need it.   You may see any of the following providers on your designated Care Team at your next follow up:  Dr. Toribio Fuel Dr. Ezra Rolan Dr. Ria Commander Dr. Morene Brownie Ellouise Class, FNP Jaun Bash, RPH-CPP  Please be sure to bring in all your medications bottles to every appointment.   Need to Contact Us :  If you have any questions or concerns before your next appointment please send us  a message through [place] or call our office at [phone].    TO LEAVE A MESSAGE FOR THE NURSE SELECT OPTION 2, PLEASE LEAVE A MESSAGE INCLUDING: YOUR NAME DATE OF BIRTH CALL BACK NUMBER REASON FOR CALL**this is important as we prioritize the call backs  YOU WILL RECEIVE A CALL BACK THE SAME DAY AS LONG AS YOU CALL BEFORE 4:00 PM
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
## 2024-08-14 ENCOUNTER — Ambulatory Visit: Payer: Self-pay | Admitting: Family

## 2024-08-14 LAB — BASIC METABOLIC PANEL WITH GFR
BUN/Creatinine Ratio: 25 — ABNORMAL HIGH (ref 10–24)
BUN: 56 mg/dL — ABNORMAL HIGH (ref 8–27)
CO2: 20 mmol/L (ref 20–29)
Calcium: 8.3 mg/dL — ABNORMAL LOW (ref 8.6–10.2)
Chloride: 109 mmol/L — ABNORMAL HIGH (ref 96–106)
Creatinine, Ser: 2.25 mg/dL — ABNORMAL HIGH (ref 0.76–1.27)
Glucose: 164 mg/dL — ABNORMAL HIGH (ref 70–99)
Potassium: 3.7 mmol/L (ref 3.5–5.2)
Sodium: 142 mmol/L (ref 134–144)
eGFR: 28 mL/min/1.73 — ABNORMAL LOW (ref >59)

## 2024-08-15 ENCOUNTER — Other Ambulatory Visit: Payer: Self-pay

## 2024-08-15 ENCOUNTER — Observation Stay
Admission: EM | Admit: 2024-08-15 | Discharge: 2024-08-17 | Disposition: A | Discharge status: Home / Self Care | Attending: Student | Admitting: Student

## 2024-08-15 ENCOUNTER — Emergency Department

## 2024-08-15 DIAGNOSIS — Z7901 Long term (current) use of anticoagulants: Secondary | ICD-10-CM | POA: Insufficient documentation

## 2024-08-15 DIAGNOSIS — E1122 Type 2 diabetes mellitus with diabetic chronic kidney disease: Secondary | ICD-10-CM | POA: Diagnosis not present

## 2024-08-15 DIAGNOSIS — I442 Atrioventricular block, complete: Secondary | ICD-10-CM | POA: Insufficient documentation

## 2024-08-15 DIAGNOSIS — K921 Melena: Secondary | ICD-10-CM | POA: Diagnosis not present

## 2024-08-15 DIAGNOSIS — R7989 Other specified abnormal findings of blood chemistry: Secondary | ICD-10-CM | POA: Diagnosis not present

## 2024-08-15 DIAGNOSIS — I5023 Acute on chronic systolic (congestive) heart failure: Principal | ICD-10-CM | POA: Diagnosis present

## 2024-08-15 DIAGNOSIS — Z87891 Personal history of nicotine dependence: Secondary | ICD-10-CM | POA: Diagnosis not present

## 2024-08-15 DIAGNOSIS — Z86718 Personal history of other venous thrombosis and embolism: Secondary | ICD-10-CM | POA: Insufficient documentation

## 2024-08-15 DIAGNOSIS — Z794 Long term (current) use of insulin: Secondary | ICD-10-CM | POA: Insufficient documentation

## 2024-08-15 DIAGNOSIS — R0789 Other chest pain: Secondary | ICD-10-CM | POA: Diagnosis present

## 2024-08-15 DIAGNOSIS — R600 Localized edema: Secondary | ICD-10-CM | POA: Insufficient documentation

## 2024-08-15 DIAGNOSIS — Z951 Presence of aortocoronary bypass graft: Secondary | ICD-10-CM | POA: Insufficient documentation

## 2024-08-15 DIAGNOSIS — Z955 Presence of coronary angioplasty implant and graft: Secondary | ICD-10-CM | POA: Diagnosis not present

## 2024-08-15 DIAGNOSIS — Z950 Presence of cardiac pacemaker: Secondary | ICD-10-CM | POA: Diagnosis not present

## 2024-08-15 DIAGNOSIS — N184 Chronic kidney disease, stage 4 (severe): Secondary | ICD-10-CM | POA: Insufficient documentation

## 2024-08-15 DIAGNOSIS — I2510 Atherosclerotic heart disease of native coronary artery without angina pectoris: Secondary | ICD-10-CM | POA: Insufficient documentation

## 2024-08-15 DIAGNOSIS — R0602 Shortness of breath: Secondary | ICD-10-CM | POA: Diagnosis present

## 2024-08-15 DIAGNOSIS — I4430 Unspecified atrioventricular block: Secondary | ICD-10-CM | POA: Diagnosis not present

## 2024-08-15 DIAGNOSIS — N1832 Chronic kidney disease, stage 3b: Secondary | ICD-10-CM | POA: Insufficient documentation

## 2024-08-15 DIAGNOSIS — I130 Hypertensive heart and chronic kidney disease with heart failure and stage 1 through stage 4 chronic kidney disease, or unspecified chronic kidney disease: Secondary | ICD-10-CM | POA: Diagnosis not present

## 2024-08-15 DIAGNOSIS — I480 Paroxysmal atrial fibrillation: Secondary | ICD-10-CM | POA: Diagnosis not present

## 2024-08-15 DIAGNOSIS — Z79899 Other long term (current) drug therapy: Secondary | ICD-10-CM | POA: Diagnosis not present

## 2024-08-15 DIAGNOSIS — I509 Heart failure, unspecified: Secondary | ICD-10-CM

## 2024-08-15 LAB — CBC
HCT: 42.8 % (ref 39.0–52.0)
Hemoglobin: 13.4 g/dL (ref 13.0–17.0)
MCH: 28.5 pg (ref 26.0–34.0)
MCHC: 31.3 g/dL (ref 30.0–36.0)
MCV: 91.1 fL (ref 80.0–100.0)
Platelets: 124 K/uL — ABNORMAL LOW (ref 150–400)
RBC: 4.7 MIL/uL (ref 4.22–5.81)
RDW: 16 % — ABNORMAL HIGH (ref 11.5–15.5)
WBC: 4.4 K/uL (ref 4.0–10.5)
nRBC: 0 % (ref 0.0–0.2)

## 2024-08-15 LAB — BASIC METABOLIC PANEL WITH GFR
Anion gap: 13 (ref 5–15)
BUN: 64 mg/dL — ABNORMAL HIGH (ref 8–23)
CO2: 23 mmol/L (ref 22–32)
Calcium: 8.6 mg/dL — ABNORMAL LOW (ref 8.9–10.3)
Chloride: 107 mmol/L (ref 98–111)
Creatinine, Ser: 2.35 mg/dL — ABNORMAL HIGH (ref 0.61–1.24)
GFR, Estimated: 26 mL/min — ABNORMAL LOW (ref >60)
Glucose, Bld: 122 mg/dL — ABNORMAL HIGH (ref 70–99)
Potassium: 4 mmol/L (ref 3.5–5.1)
Sodium: 143 mmol/L (ref 135–145)

## 2024-08-15 LAB — RESP PANEL BY RT-PCR (RSV, FLU A&B, COVID)  RVPGX2
Influenza A by PCR: NEGATIVE
Influenza B by PCR: NEGATIVE
Resp Syncytial Virus by PCR: NEGATIVE
SARS Coronavirus 2 by RT PCR: NEGATIVE

## 2024-08-15 LAB — TROPONIN I (HIGH SENSITIVITY)
Troponin I (High Sensitivity): 32 ng/L — ABNORMAL HIGH (ref <18)
Troponin I (High Sensitivity): 30 ng/L — ABNORMAL HIGH (ref <18)

## 2024-08-15 LAB — BRAIN NATRIURETIC PEPTIDE: B Natriuretic Peptide: >4500 pg/mL — ABNORMAL HIGH (ref 0.0–100.0)

## 2024-08-15 MED ORDER — FUROSEMIDE 10 MG/ML IJ SOLN
60.0000 mg | Freq: Once | INTRAMUSCULAR | Status: AC
  Administered 2024-08-15: 60 mg via INTRAVENOUS
  Filled 2024-08-15: qty 8

## 2024-08-15 NOTE — ED Triage Notes (Signed)
 Pt arrives via EMS from home for c/o CP since yesterday; hx CHF and MI; swelling to LE(s), pacemaker

## 2024-08-15 NOTE — ED Provider Notes (Signed)
 [facility] Provider Note    Event Date/Time   First MD Initiated Contact with Patient 08/15/24 2258     (approximate)   History   Chest Pain   HPI  [name] is a 89 y.o. male presents to the emergency department today because of concerns for chest pain.  States that the symptoms started yesterday.  Located in his center chest.  It is both sharp and burning.  Has been fairly constant since it started. Has also noticed some lower extremity edema. Has history of CHF and was admitted to the hospital a little over a month ago with similar symptoms. In addition the patient states that he noticed an episode of black stool earlier today. Denies any abdominal pain.     Physical Exam   Triage Vital Signs: ED Triage Vitals  Encounter Vitals Group     BP 08/15/24 2126 (!) 145/106     Girls Systolic BP Percentile --      Girls Diastolic BP Percentile --      Boys Systolic BP Percentile --      Boys Diastolic BP Percentile --      Pulse Rate 08/15/24 2126 69     Resp 08/15/24 2126 18     Temp 08/15/24 2126 98.2 F (36.8 C)     Temp Source 08/15/24 2126 Oral     SpO2 08/15/24 2117 98 %     Weight 08/15/24 2124 207 lb (93.9 kg)     Height 08/15/24 2124 5' 8 (1.727 m)     Head Circumference --      Peak Flow --      Pain Score 08/15/24 2124 4     Pain Loc --      Pain Education --      Exclude from Growth Chart --     Most recent vital signs: Vitals:   08/15/24 2117 08/15/24 2126  BP:  (!) 145/106  Pulse:  69  Resp:  18  Temp:  98.2 F (36.8 C)  SpO2: 98% 95%   General: Awake, alert, oriented. CV:  Good peripheral perfusion. Regular rate and rhythm. Resp:  Normal effort. Lungs clear. Abd:  No distention.  Other:  Bilateral lower extremity edema.    ED Results / Procedures / Treatments   Labs (all labs ordered are listed, but only abnormal results are displayed) Labs Reviewed  BASIC METABOLIC PANEL WITH GFR - Abnormal; Notable for the  following components:      Result Value   Glucose, Bld 122 (*)    BUN 64 (*)    Creatinine, Ser 2.35 (*)    Calcium  8.6 (*)    GFR, Estimated 26 (*)    All other components within normal limits  CBC - Abnormal; Notable for the following components:   RDW 16.0 (*)    Platelets 124 (*)    All other components within normal limits  BRAIN NATRIURETIC PEPTIDE - Abnormal; Notable for the following components:   B Natriuretic Peptide >4,500.0 (*)    All other components within normal limits  TROPONIN I (HIGH SENSITIVITY) - Abnormal; Notable for the following components:   Troponin I (High Sensitivity) 32 (*)    All other components within normal limits  RESP PANEL BY RT-PCR (RSV, FLU A&B, COVID)  [id number]  TROPONIN I (HIGH SENSITIVITY)     EKG  I, Guadalupe Eagles, attending physician, personally viewed and interpreted this EKG  EKG Time: 2122 Rate: 71 Rhythm: atrial senses  v paced rhythm Axis: normal Intervals: qtc 454 QRS: narrow ST changes: no st elevation Impression: abnormal ekg   RADIOLOGY I independently interpreted and visualized the CXR. My interpretation: No pneumonia Radiology interpretation:  IMPRESSION:  1. No active cardiopulmonary disease.  2. Stable mild cardiomegaly.     PROCEDURES:  Critical Care performed: Yes  CRITICAL CARE Performed by: Guadalupe Eagles   Total critical care time: 30 minutes  Critical care time was exclusive of separately billable procedures and treating other patients.  Critical care was necessary to treat or prevent imminent or life-threatening deterioration.  Critical care was time spent personally by me on the following activities: development of treatment plan with patient and/or surrogate as well as nursing, discussions with consultants, evaluation of patient's response to treatment, examination of patient, obtaining history from patient or surrogate, ordering and performing treatments and interventions, ordering and review  of laboratory studies, ordering and review of radiographic studies, pulse oximetry and re-evaluation of patient's condition.   Procedures    MEDICATIONS ORDERED IN ED: Medications - No data to display   IMPRESSION / MDM / ASSESSMENT AND PLAN / ED COURSE  I reviewed the triage vital signs and the nursing notes.                              Differential diagnosis includes, but is not limited to, CHF, pneumonia, ACS, esophagitis  Patient's presentation is most consistent with acute presentation with potential threat to life or bodily function.   The patient is on the cardiac monitor to evaluate for evidence of arrhythmia and/or significant heart rate changes.  Patient presented to the emergency department today because concerns for chest pain and shortness of breath.  Patient does have a lower extremity swelling.  EKG without ST elevation.  Blood work was notable for significantly elevated BNP.  Troponin was also elevated however repeat was stable.  Patient had also complained of an episode of black stool, however GUIAC was negative and stool was brown on the glove. Patient given dose of IV lasix  here in the emergency department. Discussed with Dr. Cleatus with the hospitalist service.       FINAL CLINICAL IMPRESSION(S) / ED DIAGNOSES   Final diagnoses:  Shortness of breath  Congestive heart failure, unspecified HF chronicity, unspecified heart failure type [facility])        Note:  This document was prepared using Dragon voice recognition software and may include unintentional dictation errors.    Eagles Guadalupe, MD 08/16/24 [id number]

## 2024-08-15 NOTE — ED Triage Notes (Signed)
 Pt to ED via EMS from home, pt reports chest pain and sob that began last night with a dry cough. Pt reports hx CHF, multiple MI's one being earlier this year, pt has pacemaker. Pt also reports swelling to bilateral lower extremities.

## 2024-08-16 ENCOUNTER — Observation Stay: Admit: 2024-08-16

## 2024-08-16 ENCOUNTER — Observation Stay

## 2024-08-16 DIAGNOSIS — I5023 Acute on chronic systolic (congestive) heart failure: Secondary | ICD-10-CM | POA: Diagnosis not present

## 2024-08-16 DIAGNOSIS — I130 Hypertensive heart and chronic kidney disease with heart failure and stage 1 through stage 4 chronic kidney disease, or unspecified chronic kidney disease: Secondary | ICD-10-CM | POA: Diagnosis not present

## 2024-08-16 LAB — MAGNESIUM: Magnesium: 1.7 mg/dL (ref 1.7–2.4)

## 2024-08-16 LAB — CBC
HCT: 41.1 % (ref 39.0–52.0)
Hemoglobin: 13 g/dL (ref 13.0–17.0)
MCH: 28.6 pg (ref 26.0–34.0)
MCHC: 31.6 g/dL (ref 30.0–36.0)
MCV: 90.5 fL (ref 80.0–100.0)
Platelets: 106 K/uL — ABNORMAL LOW (ref 150–400)
RBC: 4.54 MIL/uL (ref 4.22–5.81)
RDW: 16 % — ABNORMAL HIGH (ref 11.5–15.5)
WBC: 3.6 K/uL — ABNORMAL LOW (ref 4.0–10.5)
nRBC: 0 % (ref 0.0–0.2)

## 2024-08-16 LAB — BASIC METABOLIC PANEL WITH GFR
Anion gap: 10 (ref 5–15)
BUN: 56 mg/dL — ABNORMAL HIGH (ref 8–23)
CO2: 26 mmol/L (ref 22–32)
Calcium: 8.5 mg/dL — ABNORMAL LOW (ref 8.9–10.3)
Chloride: 106 mmol/L (ref 98–111)
Creatinine, Ser: 2.05 mg/dL — ABNORMAL HIGH (ref 0.61–1.24)
GFR, Estimated: 31 mL/min — ABNORMAL LOW (ref >60)
Glucose, Bld: 127 mg/dL — ABNORMAL HIGH (ref 70–99)
Potassium: 3.7 mmol/L (ref 3.5–5.1)
Sodium: 142 mmol/L (ref 135–145)

## 2024-08-16 LAB — CBG MONITORING, ED
Glucose-Capillary: 90 mg/dL (ref 70–99)
Glucose-Capillary: 132 mg/dL — ABNORMAL HIGH (ref 70–99)
Glucose-Capillary: 118 mg/dL — ABNORMAL HIGH (ref 70–99)
Glucose-Capillary: 144 mg/dL — ABNORMAL HIGH (ref 70–99)

## 2024-08-16 LAB — PHOSPHORUS: Phosphorus: 3.3 mg/dL (ref 2.5–4.6)

## 2024-08-16 MED ORDER — FUROSEMIDE 10 MG/ML IJ SOLN
40.0000 mg | Freq: Two times a day (BID) | INTRAMUSCULAR | Status: DC
  Administered 2024-08-16 – 2024-08-17 (×3): 40 mg via INTRAVENOUS
  Filled 2024-08-16 (×3): qty 4

## 2024-08-16 MED ORDER — MORPHINE SULFATE (PF) 2 MG/ML IV SOLN: 2.0000 mg | INTRAVENOUS | Status: DC | PRN

## 2024-08-16 MED ORDER — DAPAGLIFLOZIN PROPANEDIOL 10 MG PO TABS
10.0000 mg | ORAL_TABLET | Freq: Every day | ORAL | Status: DC
  Administered 2024-08-16 – 2024-08-17 (×2): 10 mg via ORAL
  Filled 2024-08-16 (×2): qty 1

## 2024-08-16 MED ORDER — ROSUVASTATIN CALCIUM 20 MG PO TABS
40.0000 mg | ORAL_TABLET | Freq: Every day | ORAL | Status: DC
  Administered 2024-08-16 – 2024-08-17 (×2): 40 mg via ORAL
  Filled 2024-08-16 (×2): qty 2

## 2024-08-16 MED ORDER — INSULIN ASPART 100 UNIT/ML IJ SOLN
0.0000 [IU] | Freq: Three times a day (TID) | INTRAMUSCULAR | Status: DC
  Administered 2024-08-16: 2 [IU] via SUBCUTANEOUS
  Filled 2024-08-16: qty 2

## 2024-08-16 MED ORDER — ONDANSETRON HCL 4 MG/2ML IJ SOLN: 4.0000 mg | Freq: Four times a day (QID) | INTRAMUSCULAR | Status: DC | PRN

## 2024-08-16 MED ORDER — HYDROCODONE-ACETAMINOPHEN 5-325 MG PO TABS: 1.0000 | ORAL_TABLET | ORAL | Status: DC | PRN

## 2024-08-16 MED ORDER — ONDANSETRON HCL 4 MG PO TABS: 4.0000 mg | ORAL_TABLET | Freq: Four times a day (QID) | ORAL | Status: DC | PRN

## 2024-08-16 MED ORDER — SACUBITRIL-VALSARTAN 49-51 MG PO TABS
1.0000 | ORAL_TABLET | Freq: Two times a day (BID) | ORAL | Status: DC
  Administered 2024-08-16 – 2024-08-17 (×3): 1 via ORAL
  Filled 2024-08-16 (×5): qty 1

## 2024-08-16 MED ORDER — INSULIN GLARGINE 100 UNIT/ML ~~LOC~~ SOLN
15.0000 [IU] | Freq: Every day | SUBCUTANEOUS | Status: DC
  Administered 2024-08-16: 15 [IU] via SUBCUTANEOUS
  Filled 2024-08-16 (×2): qty 0.15

## 2024-08-16 MED ORDER — SPIRONOLACTONE 25 MG PO TABS
25.0000 mg | ORAL_TABLET | Freq: Every day | ORAL | Status: DC
  Administered 2024-08-16 – 2024-08-17 (×2): 25 mg via ORAL
  Filled 2024-08-16 (×2): qty 1

## 2024-08-16 MED ORDER — ACETAMINOPHEN 325 MG PO TABS: 650.0000 mg | ORAL_TABLET | Freq: Four times a day (QID) | ORAL | Status: DC | PRN

## 2024-08-16 MED ORDER — APIXABAN 2.5 MG PO TABS
2.5000 mg | ORAL_TABLET | Freq: Two times a day (BID) | ORAL | Status: DC
  Administered 2024-08-16 – 2024-08-17 (×3): 2.5 mg via ORAL
  Filled 2024-08-16 (×5): qty 1

## 2024-08-16 MED ORDER — ACETAMINOPHEN 650 MG RE SUPP: 650.0000 mg | Freq: Four times a day (QID) | RECTAL | Status: DC | PRN

## 2024-08-16 MED ORDER — INSULIN ASPART 100 UNIT/ML IJ SOLN: 0.0000 [IU] | Freq: Every day | INTRAMUSCULAR | Status: DC

## 2024-08-16 MED ORDER — METOLAZONE 2.5 MG PO TABS
2.5000 mg | ORAL_TABLET | ORAL | Status: DC
  Administered 2024-08-16: 2.5 mg via ORAL
  Filled 2024-08-16: qty 1

## 2024-08-16 MED ORDER — CARVEDILOL 6.25 MG PO TABS
6.2500 mg | ORAL_TABLET | Freq: Two times a day (BID) | ORAL | Status: DC
Start: 2024-08-16 — End: 2024-08-17
  Administered 2024-08-16 – 2024-08-17 (×3): 6.25 mg via ORAL
  Filled 2024-08-16 (×3): qty 1

## 2024-08-16 NOTE — ED Notes (Signed)
 Pt fall bundle is in place.

## 2024-08-16 NOTE — Plan of Care (Signed)
 Patient was seen and examined at bedside.  Patient was admitted today after midnight.  Admitted due to CHF exacerbation, on IV Lasix .  We will continue current treatment and follow along. H&P reviewed No charge note due to same-day admit.

## 2024-08-16 NOTE — ED Notes (Addendum)
 Pt set up for breakfast. Independent with feeding

## 2024-08-16 NOTE — Progress Notes (Signed)
 Heart Failure Navigator Progress Note  Assessed for Heart & Vascular TOC clinic readiness.  Patient is a current Advanced Heart Failure Team patient of Ellouise Class, [place] & Ezra Shuck, MD.  Patient has a scheduled appointment already on 08/28/24 @ 2:00 PM a the Heart Failure Clinic [facility].  Navigator will sign off at this time.   Charmaine Pines, RN, BSN [facility] Heart Failure Navigator Secure Chat Only

## 2024-08-16 NOTE — TOC Initial Note (Signed)
 Transition of Care [facility]) - Initial/Assessment Note    Patient Details  Name: [name] MRN: [id number] Date of Birth: [birth year]  [facility]) CM/SW Contact:    Lauraine JAYSON Carpen, LCSW Phone Number: 08/16/2024, 12:27 PM  Clinical Narrative: Patient is active with [facility] for PT, OT, RN. CSW will continue to follow patient for support and facilitate return home once stable.                 Expected Discharge Plan: Home w Home Health Services Barriers to Discharge: Continued Medical Work up   Patient Goals and CMS Choice            Expected Discharge Plan and Services       Living arrangements for the past 2 months: Single Family Home                           HH Arranged: RN, PT, OT [facility] Agency: [facility] Date [facility] Agency Contacted: 08/16/24   Representative spoke with at [facility] Agency: Larraine  Prior Living Arrangements/Services Living arrangements for the past 2 months: Single Family Home   Patient language and need for interpreter reviewed:: Yes        Need for Family Participation in Patient Care: Yes (Comment)   Current home services: Home OT, Home PT, Home RN Criminal Activity/Legal Involvement Pertinent to Current Situation/Hospitalization: No - Comment as needed  Activities of Daily Living      Permission Sought/Granted                  Emotional Assessment       Orientation: : Oriented to Self, Oriented to Place, Oriented to  Time, Oriented to Situation Alcohol / Substance Use: Not Applicable Psych Involvement: No (comment)  Admission diagnosis:  Acute on chronic HFrEF (heart failure with reduced ejection fraction) (HCC) [I50.23] Patient Active Problem List   Diagnosis Date Noted   CHF (congestive heart failure) (HCC) 07/06/2024   Acute on chronic HFrEF (heart failure with reduced ejection fraction) (HCC) 03/24/2024   Scrotal edema 03/24/2024   Acute renal failure superimposed on stage 3b chronic kidney  disease (HCC) 03/24/2024   Shortness of breath 12/24/2023   COVID-19 12/23/2023   CHF exacerbation (HCC) 12/23/2023   ACS (acute coronary syndrome) (HCC) 06/03/2023   Chronic systolic CHF (congestive heart failure) (HCC) 06/03/2023   Cardiac resynchronization therapy pacemaker (CRT-P) in place 05/10/2023   Acute on chronic congestive heart failure (HCC) 03/09/2023   Paroxysmal A-fib (HCC) 03/09/2023   Hypomagnesemia 03/09/2023   Hypercoagulable state due to paroxysmal atrial fibrillation (HCC) 02/08/2023   Paroxysmal atrial fibrillation (HCC) 02/08/2023   Constipation 12/19/2022   Chest pain 12/19/2022   Hypertensive heart disease with heart failure (HCC) 12/18/2022   Thrombocytopenia 12/17/2022   CKD stage 3b, GFR 30-44 ml/min (HCC) 12/17/2022   Obesity (BMI 30-39.9) 12/17/2022   Hypokalemia 12/17/2022   Ischemic cardiomyopathy 12/17/2022   Hypertensive urgency 03/29/2022   Dyslipidemia 03/29/2022   GERD without esophagitis 03/29/2022   Type 2 diabetes mellitus with hyperlipidemia (HCC) 03/29/2022   Elevated troponin 03/29/2022   Acute on chronic systolic CHF (congestive heart failure) (HCC) 01/04/2022   Dilated cardiomyopathy (HCC) 10/30/2021   Acute decompensated heart failure (HCC) 10/27/2021   Complete heart block (HCC) 10/12/2021   Junctional bradycardia    Acute on chronic diastolic CHF (congestive heart failure) (HCC) 08/26/2021   CAD (coronary artery disease)  HLD (hyperlipidemia)    GERD (gastroesophageal reflux disease)    Type II diabetes mellitus with renal manifestations (HCC)    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   CAD, multiple vessel 08/25/2020   Non-ST elevation (NSTEMI) myocardial infarction (HCC) 08/21/2020   History of cardiac arrest 08/21/2020   HFrEF (heart failure with reduced ejection fraction) (HCC)    History of sustained ventricular fibrillation 08/18/2020   Acute respiratory failure with hypoxia (HCC)    Chronic diastolic heart failure (HCC)    Urinary  tract infection 07/26/2019   HTN (hypertension) 07/26/2019   Diabetes (HCC) 07/26/2019   Unstable angina (HCC) 12/03/2008   Status post percutaneous transluminal coronary angioplasty 10/08/2008   Malignant neoplasm of prostate (HCC) 10/05/2005   PCP:  [name], MD Pharmacy:   [facility] [phone] [address], [place] - [address] [employer] [place] [address] Phone: [phone] Fax: [phone]  [facility] [address] [place] [address] Phone: [phone] Fax: [phone]     Social Drivers of Health (SDOH) Social History: SDOH Screenings   Food Insecurity: No Food Insecurity (07/07/2024)  Housing: Low Risk  (07/07/2024)  Transportation Needs: No Transportation Needs (07/07/2024)  Utilities: Not At Risk (07/07/2024)  Depression (PHQ2-9): Low Risk  (04/07/2022)  Financial Resource Strain: Low Risk  (06/29/2021)   Received from [facility]  Physical Activity: Inactive (07/27/2019)  Social Connections: Socially Integrated (07/07/2024)  Stress: No Stress Concern Present (07/27/2019)  Tobacco Use: Medium Risk (08/15/2024)   SDOH Interventions:     Readmission Risk Interventions    03/26/2024    4:09 PM 03/10/2023   12:47 PM 03/29/2022    1:18 PM  Readmission Risk Prevention Plan  Transportation Screening Complete Complete Complete  PCP or Specialist Appt within 3-5 Days  Complete Complete  HRI or Home Care Consult   --  Social Work Consult for Recovery Care Planning/Counseling  Complete --  Palliative Care Screening  Not Applicable Not Applicable  Medication Review Oceanographer) Complete Complete Complete  PCP or Specialist appointment within 3-5 days of discharge Complete    HRI or Home Care Consult Complete    SW Recovery Care/Counseling Consult Complete    Palliative Care Screening Not Applicable    Skilled Nursing Facility Not Applicable

## 2024-08-16 NOTE — H&P (Signed)
 History and Physical    Patient: [name] FMW:[id number] DOB: 01/04/1935 DOA: 08/15/2024 DOS: the patient was seen and examined on 08/16/2024 PCP: [name], MD  Patient coming from: Home  Chief Complaint:  Chief Complaint  Patient presents with   Chest Pain    HPI: [name] is a 89 y.o. male with medical history significant for PAF on Eliquis , CAD s/p CABG x5 and multiple stents, HFrEF secondary to ischemic cardiomyopathy (EF 25-30% 12/2023), heart block s/p pacemaker, CKD stage IIIb, multiple past hospitalizations for CHF exacerbation most recently 8/29 to 07/08/2024, followed at heart failure clinic (last seen 08/13/2024) where he was noted to be slightly volume up , being admitted with a CHF exacerbation after presenting with a 2-day history of shortness of breath, mild chest discomfort and lower extremity edema.  He reports compliance with his meds.  He was noted denies cough, fever or chills. Separately he complained of an episode of dark stool on 10/7. In the ED mildly tachypneic with O2 sats 98% on room air, BP slightly elevated at 145/106 but otherwise unremarkable. Labs notable for troponin 30 and BNP 4500 Creatinine 2.35 up from baseline of 2.04 with BUN 64. CBC and BMP otherwise unremarkable Respiratory viral panel negative. EKG with atrial paced rhythm at 71 Chest x-ray showing stable mild cardiomegaly without active cardiopulmonary disease  Rectal exam in the ED showed brown stool that was Hemoccult negative Patient treated with Lasix  60 mg Admission requested     Review of Systems: As mentioned in the history of present illness. All other systems reviewed and are negative.  Past Medical History:  Diagnosis Date   AV block, Mobitz 1    CAD (coronary artery disease)    a. 08/2019 VF arrest-->sev 3vd on cath-->CABGx5 (LIMA->LAD, VG->OM->LCX, VG->RPDA, VG->Diag); b. 08/2021 Cath: sev native multivessel dzs w/ 5/5 patent grafts. Nl filling pressures->Med rx;  c. 05/2023 Cath: stable anatomy->Med rx.   CKD (chronic kidney disease), stage III (HCC)    Diabetes mellitus without complication (HCC)    HFrEF (heart failure with reduced ejection fraction) (HCC)    a. 08/2020 Echo: EF 40-45%; b. 11/2020 Echo: EF 55%, Gr2 DD; c. 08/2021 Echo: EF 35-40%, glob HK w/ ant/antsept/apical HK; d. 10/2021 s/p CRT-P; e. 12/2021 Echo: EF 30-35%, glob HK, GrIII DD; f. 12/2022 Echo: EF 30-35%, glob HK, sev HK of ant wall, mod asymm LVH, nl RV fxn, mildly dil LA, triv MR, AoV sclerosis.   Hyperlipidemia LDL goal <70    Hypertension    Intermittent Complete heart block (HCC)    a. 08/2021 noted on Zio; b. 10/2021 s/p Abbott Allure RF CRT-P (Ser # [id number]).   Ischemic cardiomyopathy    a. 08/2020 Echo: EF 40-45%; b. 11/2020 Echo: EF 55%; c. 08/2021 Echo: EF 35-40%; d. 10/2021 s/p CRT-P; e. 12/2021 Echo: EF 30-35%, glob HK, GrIII DD.   PAF (paroxysmal atrial fibrillation) (HCC)    a.Medical device check-->Eliquis  (CHA2DS2VASc = 6)   Trifascicular block    Past Surgical History:  Procedure Laterality Date   BACK SURGERY     CARDIAC CATHETERIZATION     CLIPPING OF ATRIAL APPENDAGE N/A 08/29/2020   Procedure: CLIPPING OF ATRIAL APPENDAGE USING ATRICURE 45 MM ATRICLIP FLEX-V;  Surgeon: Army Dallas NOVAK, MD;  Location: [facility] OR;  Service: Open Heart Surgery;  Laterality: N/A;   CORONARY ARTERY BYPASS GRAFT N/A 08/29/2020   Procedure: CORONARY ARTERY BYPASS GRAFTING (CABG) X 5 USING LEFT INTERNAL MAMMARY ARTERY AND ENDOSCOPICALLY HARVESTED RIGHT  GREATER SAPHENOUS VEIN. LIMA TO LAD, SVG TO OM SEQ TO CIRC, SVG TO PD, SVG TO DIAG.;  Surgeon: Army Dallas NOVAK, MD;  Location: [facility] OR;  Service: Open Heart Surgery;  Laterality: N/A;   ENDOVEIN HARVEST OF GREATER SAPHENOUS VEIN Right 08/29/2020   Procedure: ENDOVEIN HARVEST OF GREATER SAPHENOUS VEIN;  Surgeon: Army Dallas NOVAK, MD;  Location: [facility] OR;  Service: Open Heart Surgery;  Laterality: Right;   HERNIA REPAIR     LEFT HEART CATH AND  CORONARY ANGIOGRAPHY N/A 08/25/2020   Procedure: LEFT HEART CATH AND CORONARY ANGIOGRAPHY;  Surgeon: Darron Deatrice LABOR, MD;  Location: [facility] INVASIVE CV LAB;  Service: Cardiovascular;  Laterality: N/A;   PACEMAKER IMPLANT N/A 10/12/2021   Procedure: PACEMAKER IMPLANT;  Surgeon: Cindie Ole DASEN, MD;  Location: [facility] INVASIVE CV LAB;  Service: Cardiovascular;  Laterality: N/A;   RIGHT/LEFT HEART CATH AND CORONARY ANGIOGRAPHY N/A 08/31/2021   Procedure: RIGHT/LEFT HEART CATH AND CORONARY ANGIOGRAPHY;  Surgeon: Darron Deatrice LABOR, MD;  Location: [facility] INVASIVE CV LAB;  Service: Cardiovascular;  Laterality: N/A;   RIGHT/LEFT HEART CATH AND CORONARY/GRAFT ANGIOGRAPHY N/A 06/06/2023   Procedure: RIGHT/LEFT HEART CATH AND CORONARY/GRAFT ANGIOGRAPHY;  Surgeon: Darron Deatrice LABOR, MD;  Location: [facility] INVASIVE CV LAB;  Service: Cardiovascular;  Laterality: N/A;   TEE WITHOUT CARDIOVERSION N/A 08/29/2020   Procedure: TRANSESOPHAGEAL ECHOCARDIOGRAM (TEE);  Surgeon: Army Dallas NOVAK, MD;  Location: [facility] OR;  Service: Open Heart Surgery;  Laterality: N/A;   Social History:  reports that he quit smoking about 28 years ago. His smoking use included cigarettes. He has never used smokeless tobacco. He reports that he does not currently use alcohol. He reports that he does not use drugs.  Allergies  Allergen Reactions   Angiotensin Receptor Blockers     hyperkalemia   Metformin Diarrhea    Family History  Problem Relation Age of Onset   Heart attack Mother     Prior to Admission medications   Medication Sig Start Date End Date Taking? Authorizing Provider  acetaminophen  (TYLENOL ) 500 MG tablet Take 1,000 mg by mouth every 6 (six) hours as needed for mild pain, fever or headache.    [provider]  acyclovir  (ZOVIRAX ) 800 MG tablet Take 800 mg by mouth daily.    [provider]  apixaban  (ELIQUIS ) 2.5 MG TABS tablet TAKE 1 TABLET BY MOUTH TWICE A DAY 07/24/24   Terra Fairy PARAS, PA-C  carvedilol   (COREG ) 6.25 MG tablet TAKE 1 TABLET BY MOUTH TWICE A DAY WITH FOOD 07/24/24   Zenaida Morene PARAS, MD  dapagliflozin  propanediol (FARXIGA ) 10 MG TABS tablet Take 1 tablet (10 mg total) by mouth daily. 12/27/23   Awanda [place], MD  ezetimibe  (ZETIA ) 10 MG tablet TAKE 1 TABLET BY MOUTH EVERY DAY Patient not taking: Reported on 08/13/2024 06/29/24   Rolan Ezra RAMAN, MD  hydrALAZINE  (APRESOLINE ) 25 MG tablet Take 25 mg by mouth 2 (two) times daily. Patient not taking: Reported on 08/13/2024 04/02/24   [provider]  Insulin  Glargine (BASAGLAR  KWIKPEN) 100 UNIT/ML Inject 15 Units into the skin at bedtime. 02/19/24 02/18/25  [provider]  isosorbide  mononitrate (IMDUR ) 60 MG 24 hr tablet Take 60 mg by mouth daily. Patient not taking: Reported on 08/13/2024 04/02/24   [provider]  magnesium  oxide (MAG-OX) 400 MG tablet Take 1 tablet (400 mg total) by mouth daily. Patient not taking: Reported on 08/13/2024 01/20/22   Cindie Ole DASEN, MD  metolazone  (ZAROXOLYN ) 2.5 MG tablet Take  2.5 mg by mouth 2 (two) times a week. Every Tuesday and Friday    [provider]  pantoprazole  (PROTONIX ) 40 MG tablet Take 40 mg by mouth daily.    [provider]  polyethylene glycol (MIRALAX  / GLYCOLAX ) 17 g packet Take 17 g by mouth daily as needed for moderate constipation. 12/19/22   Josette Ade, MD  potassium chloride  SA (KLOR-CON  M) 20 MEQ tablet Take 2 tablets (40 mEq total) by mouth daily. 04/20/24   Donette Ellouise LABOR, FNP  rosuvastatin  (CRESTOR ) 40 MG tablet Take 1 tablet (40 mg total) by mouth daily. 05/25/23   Gerard Frederick, NP  sacubitril -valsartan  (ENTRESTO ) 49-51 MG Take 1 tablet by mouth 2 (two) times daily. 01/25/24   Zenaida Morene PARAS, MD  spironolactone  (ALDACTONE ) 25 MG tablet Take 1 tablet (25 mg total) by mouth daily. 04/19/24 08/13/24  Donette Ellouise LABOR, FNP  tamsulosin  (FLOMAX ) 0.4 MG CAPS capsule Take 0.4 mg by mouth daily.    [provider]  Torsemide  60  MG TABS Take 60 mg by mouth daily. 05/18/24 08/13/24  Rolan Ezra RAMAN, MD    Physical Exam: Vitals:   08/16/24 0100 08/16/24 0130 08/16/24 0200 08/16/24 0224  BP: (!) 148/108 (!) 144/93 137/81   Pulse: 78 60 (!) 59   Resp: 19 12 11    Temp:    (!) 97.5 F (36.4 C)  TempSrc:    Oral  SpO2: 100% 97% 96%   Weight:      Height:       Physical Exam Vitals and nursing note reviewed.  Constitutional:      General: He is sleeping. He is not in acute distress.    Comments: Elderly male , appears chronically physically deconditioned  HENT:     Head: Normocephalic and atraumatic.  Cardiovascular:     Rate and Rhythm: Normal rate and regular rhythm.     Heart sounds: Normal heart sounds.  Pulmonary:     Effort: Pulmonary effort is normal.     Breath sounds: Normal breath sounds.  Abdominal:     Palpations: Abdomen is soft.     Tenderness: There is no abdominal tenderness.  Neurological:     Mental Status: He is easily aroused. Mental status is at baseline.     Labs on Admission: I have personally reviewed following labs and imaging studies  CBC: Recent Labs  Lab 08/15/24 2127  WBC 4.4  HGB 13.4  HCT 42.8  MCV 91.1  PLT 124*   Basic Metabolic Panel: Recent Labs  Lab 08/13/24 1507 08/15/24 2127  NA 142 143  K 3.7 4.0  CL 109* 107  CO2 20 23  GLUCOSE 164* 122*  BUN 56* 64*  CREATININE 2.25* 2.35*  CALCIUM  8.3* 8.6*   GFR: Estimated Creatinine Clearance: 25.1 mL/min (A) (by C-G formula based on SCr of 2.35 mg/dL (H)). Liver Function Tests: No results for input(s): AST, ALT, ALKPHOS, BILITOT, PROT, ALBUMIN  in the last 168 hours. No results for input(s): LIPASE, AMYLASE in the last 168 hours. No results for input(s): AMMONIA in the last 168 hours. Coagulation Profile: No results for input(s): INR, PROTIME in the last 168 hours. Cardiac Enzymes: No results for input(s): CKTOTAL, CKMB, CKMBINDEX, TROPONINI in the last 168 hours. BNP  (last 3 results) No results for input(s): PROBNP in the last 8760 hours. HbA1C: No results for input(s): HGBA1C in the last 72 hours. CBG: No results for input(s): GLUCAP in the last 168 hours. Lipid Profile: No results for  input(s): CHOL, HDL, LDLCALC, TRIG, CHOLHDL, LDLDIRECT in the last 72 hours. Thyroid Function Tests: No results for input(s): TSH, T4TOTAL, FREET4, T3FREE, THYROIDAB in the last 72 hours. Anemia Panel: No results for input(s): VITAMINB12, FOLATE, FERRITIN, TIBC, IRON, RETICCTPCT in the last 72 hours. Urine analysis:    Component Value Date/Time   COLORURINE YELLOW (A) 03/23/2024 2042   APPEARANCEUR HAZY (A) 03/23/2024 2042   LABSPEC 1.015 03/23/2024 2042   PHURINE 6.0 03/23/2024 2042   GLUCOSEU >=500 (A) 03/23/2024 2042   HGBUR SMALL (A) 03/23/2024 2042   BILIRUBINUR NEGATIVE 03/23/2024 2042   KETONESUR NEGATIVE 03/23/2024 2042   PROTEINUR 100 (A) 03/23/2024 2042   NITRITE NEGATIVE 03/23/2024 2042   LEUKOCYTESUR LARGE (A) 03/23/2024 2042    Radiological Exams on Admission: DG Chest 2 View Result Date: 08/15/2024 CLINICAL DATA:  Chest pain EXAM: CHEST - 2 VIEW COMPARISON:  07/07/2024 FINDINGS: Calcified mass overlies the right mid lung zone corresponding to an area of myositis ossificans better seen on CT examination of 12/15/2022. Lungs are otherwise clear. No pneumothorax or pleural effusion. Coronary artery bypass grafting has been performed. Left atrial clipping noted. Left subclavian 3 lead pacer in place. Stable mild cardiomegaly. Pulmonary vascularity is normal. No acute bone abnormality. IMPRESSION: 1. No active cardiopulmonary disease. 2. Stable mild cardiomegaly. Electronically Signed   By: Dorethia Molt M.D.   On: 08/15/2024 21:58   Data Reviewed for HPI: Relevant notes from primary care and specialist visits, past discharge summaries as available in EHR, including Care Everywhere. Prior diagnostic testing as  pertinent to current admission diagnoses Updated medications and problem lists for reconciliation ED course, including vitals, labs, imaging, treatment and response to treatment Triage notes, nursing and pharmacy notes and ED provider's notes Notable results as noted above in HPI      Assessment and Plan:   Acute on chronic HFrEF (heart failure with reduced ejection fraction) (HCC) IV Lasix  Continue home carvedilol , Entresto , spironolactone  Daily weights with intake and output monitoring EF 25 to 30% on echo 12/2023   Elevated troponin with chest pressure CAD s/p CABG, multiple vessel CAD on LHC 05/2023 - Mild troponin elevation to 30 which appears to be his baseline -Continue Imdur , Crestor , carvedilol  and apixaban  -Continue to trend troponin -Can consider cardiology consult if uptrending troponin  Single episode of dark stool 08/14/2024 -Rectal exam in the ED revealed brown stool guaiac negative - No recent colonoscopy - Monitor H&H   CKD stage IIIb-IV  (HCC) Mild worsening and baseline kidney function likely due to torsemide /metolazone /spironolactone  Near baseline monitor renal function and avoid nephrotoxins  AV block s/p PPM - No acute issues suspected  Paroxysmal A-fib - On Eliquis     DVT prophylaxis: Eliquis   Consults: none  Advance Care Planning:   Code Status: Prior   Family Communication: none  Disposition Plan: Back to previous home environment  Severity of Illness: The appropriate patient status for this patient is OBSERVATION. Observation status is judged to be reasonable and necessary in order to provide the required intensity of service to ensure the patient's safety. The patient's presenting symptoms, physical exam findings, and initial radiographic and laboratory data in the context of their medical condition is felt to place them at decreased risk for further clinical deterioration. Furthermore, it is anticipated that the patient will be medically  stable for discharge from the hospital within 2 midnights of admission.   Author: Delayne LULLA Solian, MD 08/16/2024 2:47 AM  For on call review [email or web address].

## 2024-08-16 NOTE — Hospital Course (Signed)
 [name]

## 2024-08-16 NOTE — ED Notes (Signed)
 Rn at bedside with MD for rectal exam
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
## 2024-08-17 ENCOUNTER — Observation Stay

## 2024-08-17 ENCOUNTER — Observation Stay
Admit: 2024-08-17 | Discharge: 2024-08-17 | Disposition: A | Discharge status: Home / Self Care | Attending: Internal Medicine | Admitting: Internal Medicine

## 2024-08-17 DIAGNOSIS — I5021 Acute systolic (congestive) heart failure: Secondary | ICD-10-CM

## 2024-08-17 DIAGNOSIS — I5023 Acute on chronic systolic (congestive) heart failure: Secondary | ICD-10-CM | POA: Diagnosis not present

## 2024-08-17 DIAGNOSIS — I130 Hypertensive heart and chronic kidney disease with heart failure and stage 1 through stage 4 chronic kidney disease, or unspecified chronic kidney disease: Secondary | ICD-10-CM | POA: Diagnosis not present

## 2024-08-17 LAB — CBC
HCT: 42.6 % (ref 39.0–52.0)
Hemoglobin: 13.8 g/dL (ref 13.0–17.0)
MCH: 28.7 pg (ref 26.0–34.0)
MCHC: 32.4 g/dL (ref 30.0–36.0)
MCV: 88.6 fL (ref 80.0–100.0)
Platelets: 130 K/uL — ABNORMAL LOW (ref 150–400)
RBC: 4.81 MIL/uL (ref 4.22–5.81)
RDW: 15.8 % — ABNORMAL HIGH (ref 11.5–15.5)
WBC: 4.5 K/uL (ref 4.0–10.5)
nRBC: 0 % (ref 0.0–0.2)

## 2024-08-17 LAB — BASIC METABOLIC PANEL WITH GFR
Anion gap: 9 (ref 5–15)
BUN: 53 mg/dL — ABNORMAL HIGH (ref 8–23)
CO2: 29 mmol/L (ref 22–32)
Calcium: 8.8 mg/dL — ABNORMAL LOW (ref 8.9–10.3)
Chloride: 104 mmol/L (ref 98–111)
Creatinine, Ser: 2.19 mg/dL — ABNORMAL HIGH (ref 0.61–1.24)
GFR, Estimated: 29 mL/min — ABNORMAL LOW (ref >60)
Glucose, Bld: 106 mg/dL — ABNORMAL HIGH (ref 70–99)
Potassium: 3.3 mmol/L — ABNORMAL LOW (ref 3.5–5.1)
Sodium: 142 mmol/L (ref 135–145)

## 2024-08-17 LAB — ECHOCARDIOGRAM COMPLETE
AR max vel: 1.62 cm2
AV Area VTI: 1.91 cm2
AV Area mean vel: 1.59 cm2
AV Mean grad: 2.5 mmHg
AV Peak grad: 4.2 mmHg
Ao pk vel: 1.02 m/s
Area-P 1/2: 4.77 cm2
Calc EF: 29.3 %
Height: 68 in
S' Lateral: 5.15 cm
Single Plane A2C EF: 31.2 %
Single Plane A4C EF: 27.4 %
Weight: 3312 [oz_av]

## 2024-08-17 LAB — PHOSPHORUS: Phosphorus: 3.5 mg/dL (ref 2.5–4.6)

## 2024-08-17 LAB — CBG MONITORING, ED
Glucose-Capillary: 103 mg/dL — ABNORMAL HIGH (ref 70–99)
Glucose-Capillary: 108 mg/dL — ABNORMAL HIGH (ref 70–99)

## 2024-08-17 LAB — MAGNESIUM: Magnesium: 1.7 mg/dL (ref 1.7–2.4)

## 2024-08-17 MED ORDER — POTASSIUM CHLORIDE CRYS ER 20 MEQ PO TBCR
40.0000 meq | EXTENDED_RELEASE_TABLET | Freq: Once | ORAL | Status: AC
  Administered 2024-08-17: 40 meq via ORAL
  Filled 2024-08-17: qty 2
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
## 2024-08-17 NOTE — ED Notes (Signed)
 Pt resting in stretcher, advised that his room upstairs is being cleaned. Pt states he was under the impression that he was being discharged today. No discharge orders in at this time but will message the provider to ask

## 2024-08-17 NOTE — Discharge Instructions (Signed)
 Patient is active with [facility] for PT, OT and RN.

## 2024-08-17 NOTE — ED Notes (Signed)
 Fall risk bundle is currently in place.

## 2024-08-17 NOTE — Discharge Summary (Signed)
 [facility] Discharge Summary   Patient: [name] FMW:[id number]  PCP: [name], MD  Date of admission: 08/15/2024   Date of discharge: 08/17/2024     Discharge Diagnoses:  Principal Problem:   Acute on chronic HFrEF (heart failure with reduced ejection fraction) (HCC)   Admitted From: Home Disposition:  Home  Recommendations for Outpatient Follow-up:  Follow-up with PCP in 1 week Follow-up with cardiology in 1 week. Monitor BP at home and follow with PCP to titrate medication accordingly. Follow up LABS/TEST:  Repeat BMP after 1 week   Follow-up Information     [facility]. Go on 08/28/2024.   Specialty: Cardiology Why: Hospital Follow-Up 08/28/24 @ 2:00 PM Please bring all medications to follow-up apppointment [facility], [address], Second Floor Free Valet Parking at the [employer] information: [address] [place]   [address] [phone]        [name], MD Follow up in 1 week(s).   Specialty: Internal Medicine Contact information: [address] [place] [address] [phone]                Diet recommendation: Cardiac diet  Activity: The patient is advised to gradually reintroduce usual activities, as tolerated  Discharge Condition: stable  Code Status: Full code   History of present illness: As per the H and P dictated on admission.  Hospital Course:  Ashvin Adelson is a 89 y.o. male with medical history significant for PAF on Eliquis , CAD s/p CABG x5 and multiple stents, HFrEF secondary to ischemic cardiomyopathy (EF 25-30% 12/2023), heart block s/p pacemaker, CKD stage IIIb, multiple past hospitalizations for CHF exacerbation most recently 8/29 to 07/08/2024, followed at heart failure clinic (last seen 08/13/2024) where he was noted to be slightly volume up , being admitted with a CHF exacerbation after presenting with a 2-day  history of shortness of breath, mild chest discomfort and lower extremity edema.  He reports compliance with his meds.  He was noted denies cough, fever or chills. Separately he complained of an episode of dark stool on 10/7. In the ED mildly tachypneic with O2 sats 98% on room air, BP slightly elevated at 145/106 but otherwise unremarkable. Labs notable for troponin 30 and BNP 4500 Creatinine 2.35 up from baseline of 2.04 with BUN 64. CBC and BMP otherwise unremarkable Respiratory viral panel negative. EKG with atrial paced rhythm at 71 Chest x-ray showing stable mild cardiomegaly without active cardiopulmonary disease   Rectal exam in the ED showed brown stool that was Hemoccult negative Patient treated with Lasix  60 mg Admission requested      Assessment and Plan:   # Acute on chronic HFrEF (heart failure with reduced ejection fraction): s/p IV Lasix  40 mg IV twice daily during hospital stay. Continue home carvedilol , Entresto , spironolactone  TTE EF 25 to 30% on echo 12/2023 10/10 TTE LVEF 25 to 30%, LV severely decreased function with regional wall motion abnormality mild hypertrophy.  RV systolic function moderately reduced.  TR mild to moderate Bilateral lower extremity edema, left is more than right so venous duplex was done. Venous duplex left lower extremity 1. No evidence of acute femoropopliteal DVT or superficial thrombophlebitis within the LEFT lower extremity. 2. 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 CT Chest: 1. Small bilateral pleural effusions with adjacent subsegmental atelectasis. 2. Bronchial wall thickening in both lower lobes, suggestive of chronic inflammation. Patient is  breathing improved, lower extremity edema resolved.  Patient is feeling a lot better.  Patient was clinically stable and medically optimized.  Resumed home medications and cleared for discharge.   # Elevated troponin with chest  pressure # CAD s/p CABG, multiple vessel CAD on LHC 05/2023 - Mild troponin elevation to 30 which appears to be his baseline -Continue Imdur , Crestor , carvedilol  and apixaban  Patient remained chest pain-free recommended to follow with cardiology as an outpatient.   # Single episode of dark stool 08/14/2024 -Rectal exam in the ED revealed brown stool guaiac negative - No recent colonoscopy.  H&H remained stable, recommended to follow-up with GI as an outpatient.   # CKD stage IIIb-IV: Mild worsening and baseline kidney function likely due to torsemide /metolazone /spironolactone  Near baseline monitor renal function and avoid nephrotoxins   # AV block s/p PPM: No acute issues suspected # Paroxysmal A-fib: On Eliquis    Body mass index is 31.47 kg/m.  Nutrition Interventions:  Patient was ambulatory without any assistance. On the day of the discharge the patient's vitals were stable, and no other acute medical condition were reported by patient. the patient was felt safe to be discharge at Home.  Consultants: None Procedures: None  Discharge Exam: General: Appear in no distress, Oral Mucosa Clear, moist. Cardiovascular: S1 and S2 Present, no Murmur, Respiratory: normal respiratory effort, Bilateral Air entry present and no Crackles, no wheezes Abdomen: Bowel Sound present, Soft and no tenderness. Extremities: no Pedal edema, no calf tenderness Neurology: alert and oriented to time, place, and person affect appropriate.  Filed Weights   08/15/24 2124  Weight: 93.9 kg   Vitals:   08/17/24 1119 08/17/24 1530  BP: 136/84 122/89  Pulse: 68 63  Resp: (!) 21 14  Temp: 97.7 F (36.5 C) 97.9 F (36.6 C)  SpO2: 100% 100%    DISCHARGE MEDICATION: Allergies as of 08/17/2024       Reactions   Angiotensin Receptor Blockers    hyperkalemia   Metformin Diarrhea        Medication List     STOP taking these medications    hydrALAZINE  25 MG tablet Commonly known as:  APRESOLINE    isosorbide  mononitrate 60 MG 24 hr tablet Commonly known as: IMDUR        TAKE these medications    acetaminophen  500 MG tablet Commonly known as: TYLENOL  Take 1,000 mg by mouth every 6 (six) hours as needed for mild pain, fever or headache.   acyclovir  800 MG tablet Commonly known as: ZOVIRAX  Take 800 mg by mouth daily.   Basaglar  KwikPen 100 UNIT/ML Inject 15 Units into the skin at bedtime.   carvedilol  6.25 MG tablet Commonly known as: COREG  TAKE 1 TABLET BY MOUTH TWICE A DAY WITH FOOD   dapagliflozin  propanediol 10 MG Tabs tablet Commonly known as: Farxiga  Take 1 tablet (10 mg total) by mouth daily.   Eliquis  2.5 MG Tabs tablet Generic drug: apixaban  TAKE 1 TABLET BY MOUTH TWICE A DAY   Entresto  49-51 MG Generic drug: sacubitril -valsartan  Take 1 tablet by mouth 2 (two) times daily.   ezetimibe  10 MG tablet Commonly known as: ZETIA  TAKE 1 TABLET BY MOUTH EVERY DAY   magnesium  oxide 400 MG tablet Commonly known as: MAG-OX Take 1 tablet (400 mg total) by mouth daily.   metolazone  2.5 MG tablet Commonly known as: ZAROXOLYN  Take 2.5 mg by mouth 2 (two) times a week. Every Tuesday and Friday   pantoprazole  40 MG tablet Commonly known as: PROTONIX  Take 40 mg  by mouth daily.   polyethylene glycol 17 g packet Commonly known as: MIRALAX  / GLYCOLAX  Take 17 g by mouth daily as needed for moderate constipation.   potassium chloride  SA 20 MEQ tablet Commonly known as: KLOR-CON  M Take 2 tablets (40 mEq total) by mouth daily.   rosuvastatin  40 MG tablet Commonly known as: CRESTOR  Take 1 tablet (40 mg total) by mouth daily.   spironolactone  25 MG tablet Commonly known as: ALDACTONE  Take 1 tablet (25 mg total) by mouth daily.   tamsulosin  0.4 MG Caps capsule Commonly known as: FLOMAX  Take 0.4 mg by mouth daily.   torsemide  20 MG tablet Commonly known as: DEMADEX  Take 40-60 mg by mouth daily. Alternating days 40 mg and 60 mg What changed:  Another medication with the same name was removed. Continue taking this medication, and follow the directions you see here.       Allergies  Allergen Reactions   Angiotensin Receptor Blockers     hyperkalemia   Metformin Diarrhea   Discharge Instructions     Call MD for:  difficulty breathing, headache or visual disturbances   Complete by: As directed    Call MD for:  extreme fatigue   Complete by: As directed    Call MD for:  persistant dizziness or light-headedness   Complete by: As directed    Call MD for:  persistant nausea and vomiting   Complete by: As directed    Call MD for:  severe uncontrolled pain   Complete by: As directed    Call MD for:  temperature >100.4   Complete by: As directed    Diet - low sodium heart healthy   Complete by: As directed    Discharge instructions   Complete by: As directed    Follow-up with PCP in 1 week Follow-up with cardiology in 1 week. Repeat BMP after 1 week Monitor BP at home and follow with PCP to titrate medication accordingly.   Increase activity slowly   Complete by: As directed        The results of significant diagnostics from this hospitalization (including imaging, microbiology, ancillary and laboratory) are listed below for reference.    Significant Diagnostic Studies: CT CHEST WO CONTRAST Result Date: 08/17/2024 EXAM: CT CHEST WITHOUT CONTRAST 08/17/2024 01:55:32 PM TECHNIQUE: CT of the chest was performed without the administration of intravenous contrast. Multiplanar reformatted images are provided for review. Automated exposure control, iterative reconstruction, and/or weight based adjustment of the mA/kV was utilized to reduce the radiation dose to as low as reasonably achievable. COMPARISON: 12/15/2022 CLINICAL HISTORY: Dyspnea, chronic, chest wall or pleura disease suspected; r/o pleural effusion and need for thoracentesis? FINDINGS: MEDIASTINUM: Status post coronary artery bypass graft. Stable cardiomegaly.  Atherosclerosis of thoracic aorta is noted without aneurysm formation. The central airways are clear. LYMPH NODES: No mediastinal, hilar or axillary lymphadenopathy. LUNGS AND PLEURA: Small bilateral pleural effusions are noted with adjacent subsegmental atelectasis. Bronchial wall thickening is seen in both lower lobes suggesting chronic inflammation. No focal consolidation or pulmonary edema. No pneumothorax. SOFT TISSUES/BONES: Stable lobulated calcifications seen anterior to right scapula most consistent with old traumatic injury. No acute abnormality of the bones or soft tissues. UPPER ABDOMEN: Limited images of the upper abdomen demonstrates no acute abnormality. IMPRESSION: 1. Small bilateral pleural effusions with adjacent subsegmental atelectasis. 2. Bronchial wall thickening in both lower lobes, suggestive of chronic inflammation. Electronically signed by: Lynwood Seip MD 08/17/2024 03:03 PM EDT RP Workstation: [id number]   ECHOCARDIOGRAM COMPLETE Result  Date: 08/17/2024    ECHOCARDIOGRAM REPORT   Patient Name:   LEVAUGHN PUCCINELLI Date of Exam: 08/17/2024 Medical Rec #:  [id number]    Height:       68.0 in Accession #:    [id number]   Weight:       207.0 lb Date of Birth:  Oct 05, 1935   BSA:          2.074 m Patient Age:    88 years     BP:           176/89 mmHg Patient Gender: M            HR:           71 bpm. Exam Location:  [facility] Procedure: 2D Echo, Cardiac Doppler and Color Doppler (Both Spectral and Color            Flow Doppler were utilized during procedure). Indications:     CHF-acute systolic I50.21  History:         Patient has prior history of Echocardiogram examinations, most                  recent 12/25/2023. CAD; Risk Factors:Hypertension. Ischemic                  cardiomyopathy.  Sonographer:     Christopher Furnace Referring Phys:  [id number] DELAYNE LULLA SOLIAN Diagnosing Phys: Caron Poser IMPRESSIONS  1. Left ventricular ejection fraction, by estimation, is 25 to 30%. The left ventricle has severely  decreased function. The left ventricle demonstrates regional wall motion abnormalities (see scoring diagram/findings for description). There is mild left  ventricular hypertrophy. Left ventricular diastolic parameters are indeterminate.  2. Right ventricular systolic function is moderately reduced. The right ventricular size is moderately enlarged.  3. Large pleural effusion.  4. The mitral valve was not well visualized. Mild mitral valve regurgitation. No evidence of mitral stenosis.  5. Tricuspid valve regurgitation is mild to moderate.  6. The aortic valve is tricuspid. There is mild calcification of the aortic valve. Aortic valve regurgitation is trivial. Aortic valve sclerosis is present, with no evidence of aortic valve stenosis. Comparison(s): A prior study was performed on 12/24/2023. No significant change from prior study. FINDINGS  Left Ventricle: Left ventricular ejection fraction, by estimation, is 25 to 30%. The left ventricle has severely decreased function. The left ventricle demonstrates regional wall motion abnormalities. The left ventricular internal cavity size was normal  in size. There is mild left ventricular hypertrophy. Left ventricular diastolic parameters are indeterminate. Right Ventricle: The right ventricular size is moderately enlarged. Right vetricular wall thickness was not well visualized. Right ventricular systolic function is moderately reduced. Left Atrium: Left atrial size was not well visualized. Right Atrium: Right atrial size was moderately dilated. Pericardium: There is no evidence of pericardial effusion. Mitral Valve: The mitral valve was not well visualized. Mild mitral valve regurgitation. No evidence of mitral valve stenosis. Tricuspid Valve: The tricuspid valve is not well visualized. Tricuspid valve regurgitation is mild to moderate. No evidence of tricuspid stenosis. Aortic Valve: The aortic valve is tricuspid. There is mild calcification of the aortic valve. Aortic  valve regurgitation is trivial. Aortic valve sclerosis is present, with no evidence of aortic valve stenosis. Aortic valve mean gradient measures 2.5 mmHg. Aortic valve peak gradient measures 4.2 mmHg. Aortic valve area, by VTI measures 1.91 cm. Pulmonic Valve: The pulmonic valve was normal in structure. Pulmonic valve regurgitation is mild. No evidence of pulmonic stenosis. Aorta: The  aortic root and ascending aorta are structurally normal, with no evidence of dilitation. Venous: The inferior vena cava was not well visualized. IAS/Shunts: The interatrial septum was not well visualized. Additional Comments: A device lead is visualized. There is a large pleural effusion.  LEFT VENTRICLE PLAX 2D LVIDd:         5.71 cm      Diastology LVIDs:         5.15 cm      LV e' medial:    3.19 cm/s LV PW:         1.22 cm      LV E/e' medial:  25.4 LV IVS:        1.66 cm      LV e' lateral:   5.13 cm/s LVOT diam:     2.10 cm      LV E/e' lateral: 15.8 LV SV:         37 LV SV Index:   18 LVOT Area:     3.46 cm  LV Volumes (MOD) LV vol d, MOD A2C: 205.0 ml LV vol d, MOD A4C: 157.0 ml LV vol s, MOD A2C: 141.0 ml LV vol s, MOD A4C: 114.0 ml LV SV MOD A2C:     64.0 ml LV SV MOD A4C:     157.0 ml LV SV MOD BP:      55.1 ml RIGHT VENTRICLE RV Basal diam:  5.37 cm RV Mid diam:    4.82 cm LEFT ATRIUM             Index        RIGHT ATRIUM           Index LA diam:        3.70 cm 1.78 cm/m   RA Area:     26.80 cm LA Vol (A2C):   85.4 ml 41.18 ml/m  RA Volume:   97.40 ml  46.97 ml/m LA Vol (A4C):   41.5 ml 20.01 ml/m LA Biplane Vol: 62.8 ml 30.28 ml/m  AORTIC VALVE AV Area (Vmax):    1.62 cm AV Area (Vmean):   1.59 cm AV Area (VTI):     1.91 cm AV Vmax:           102.35 cm/s AV Vmean:          66.650 cm/s AV VTI:            0.192 m AV Peak Grad:      4.2 mmHg AV Mean Grad:      2.5 mmHg LVOT Vmax:         48.00 cm/s LVOT Vmean:        30.600 cm/s LVOT VTI:          0.106 m LVOT/AV VTI ratio: 0.55  AORTA Ao Root diam: 2.90 cm MITRAL  VALVE               TRICUSPID VALVE MV Area (PHT): 4.77 cm    TR Peak grad:   24.4 mmHg MV Decel Time: 159 msec    TR Vmax:        247.00 cm/s MV E velocity: 81.00 cm/s MV A velocity: 22.30 cm/s  SHUNTS MV E/A ratio:  3.63        Systemic VTI:  0.11 m                            Systemic Diam: 2.10 cm Caron Poser Electronically  signed by Caron Poser Signature Date/Time: 08/17/2024/10:00:00 AM    Final    US  Venous Img Lower Unilateral Left (DVT) Result Date: 08/16/2024 CLINICAL DATA:  13689 Swelling 13689 EXAM: LEFT LOWER EXTREMITY VENOUS DOPPLER ULTRASOUND TECHNIQUE: Gray-scale sonography with compression, as well as color and duplex ultrasound, were performed to evaluate the deep venous system(s) from the level of the common femoral vein through the popliteal and proximal calf veins. COMPARISON:  CTA chest, 12/15/2022 FINDINGS: VENOUS Eccentric heterogeneously echogenic and linear filling defect within and incomplete compressibility of imaged portions of the LEFT femoral vein. Normal compressibility of the common femoral, and popliteal veins, as well as the visualized calf veins. Visualized portions of profunda femoral vein and great saphenous vein unremarkable. No filling defects to suggest acute DVT on grayscale or color Doppler imaging. Doppler waveforms show normal direction of venous flow, normal respiratory plasticity and response to augmentation. Limited views of the contralateral common femoral vein are unremarkable. OTHER No evidence of superficial thrombophlebitis or abnormal fluid collection. Subcutaneous edema of the imaged distal LEFT lower extremity. Limitations: none IMPRESSION: 1. No evidence of acute femoropopliteal DVT or superficial thrombophlebitis within the LEFT lower extremity. 2. 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 Thom Hall, MD [facility] Electronically  Signed   By: Thom Hall M.D.   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 Lungs are otherwise clear. No pneumothorax or pleural effusion. Coronary artery bypass grafting has been performed. Left atrial clipping noted. Left subclavian 3 lead pacer in place. Stable mild cardiomegaly. Pulmonary vascularity is normal. No acute bone abnormality. IMPRESSION: 1. No active cardiopulmonary disease. 2. Stable mild cardiomegaly. Electronically Signed   By: Dorethia Molt M.D.   On: 08/15/2024 21:58    Microbiology: Recent Results (from the past 240 hours)  Resp panel by RT-PCR (RSV, Flu A&B, Covid) Anterior Nasal Swab     Status: None   Collection Time: 08/15/24  9:28 PM   Specimen: Anterior Nasal Swab  Result Value Ref Range Status   SARS Coronavirus 2 by RT PCR NEGATIVE NEGATIVE Final    Comment: (NOTE) SARS-CoV-2 target nucleic acids are NOT DETECTED.  The SARS-CoV-2 RNA is generally detectable in upper respiratory specimens during the acute phase of infection. The lowest concentration of SARS-CoV-2 viral copies this assay can detect is 138 copies/mL. A negative result does not preclude SARS-Cov-2 infection and should not be used as the sole basis for treatment or other patient management decisions. A negative result may occur with  improper specimen collection/handling, submission of specimen other than nasopharyngeal swab, presence of viral mutation(s) within the areas targeted by this assay, and inadequate number of viral copies(<138 copies/mL). A negative result must be combined with clinical observations, patient history, and epidemiological information. The expected result is Negative.  Fact Sheet for Patients:   BloggerCourse.com  Fact Sheet for Healthcare Providers:  SeriousBroker.it  This test is no t yet approved or cleared by the [place]  FDA and  has been authorized for detection and/or diagnosis of SARS-CoV-2 by FDA under an Emergency Use Authorization (EUA). This EUA will remain  in effect (meaning this test can be used) for the duration of the COVID-19 declaration under  Section 564(b)(1) of the Act, 21 U.S.C.section 360bbb-3(b)(1), unless the authorization is terminated  or revoked sooner.       Influenza A by PCR NEGATIVE NEGATIVE Final   Influenza B by PCR NEGATIVE NEGATIVE Final    Comment: (NOTE) The Xpert Xpress SARS-CoV-2/FLU/RSV plus assay is intended as an aid in the diagnosis of influenza from Nasopharyngeal swab specimens and should not be used as a sole basis for treatment. Nasal washings and aspirates are unacceptable for Xpert Xpress SARS-CoV-2/FLU/RSV testing.  Fact Sheet for Patients: BloggerCourse.com  Fact Sheet for Healthcare Providers: SeriousBroker.it  This test is not yet approved or cleared by the [place]  FDA and has been authorized for detection and/or diagnosis of SARS-CoV-2 by FDA under an Emergency Use Authorization (EUA). This EUA will remain in effect (meaning this test can be used) for the duration of the COVID-19 declaration under Section 564(b)(1) of the Act, 21 U.S.C. section 360bbb-3(b)(1), unless the authorization is terminated or revoked.     Resp Syncytial Virus by PCR NEGATIVE NEGATIVE Final    Comment: (NOTE) Fact Sheet for Patients: BloggerCourse.com  Fact Sheet for Healthcare Providers: SeriousBroker.it  This test is not yet approved or cleared by the [place]  FDA and has been authorized for detection and/or diagnosis of SARS-CoV-2 by FDA under an Emergency Use  Authorization (EUA). This EUA will remain in effect (meaning this test can be used) for the duration of the COVID-19 declaration under Section 564(b)(1) of the Act, 21 U.S.C. section 360bbb-3(b)(1), unless the authorization is terminated or revoked.  Performed at [facility], [address] [place], [place] [address]      Labs: CBC: Recent Labs  Lab 08/15/24 2127 08/16/24 0439 08/17/24 0558  WBC 4.4 3.6* 4.5  HGB 13.4 13.0 13.8  HCT 42.8 41.1 42.6  MCV 91.1 90.5 88.6  PLT 124* 106* 130*   Basic Metabolic Panel: Recent Labs  Lab 08/13/24 1507 08/15/24 2127 08/16/24 1141 08/17/24 0558  NA 142 143 142 142  K 3.7 4.0 3.7 3.3*  CL 109* 107 106 104  CO2 20 23 26 29   GLUCOSE 164* 122* 127* 106*  BUN 56* 64* 56* 53*  CREATININE 2.25* 2.35* 2.05* 2.19*  CALCIUM  8.3* 8.6* 8.5* 8.8*  MG  --   --  1.7 1.7  PHOS  --   --  3.3 3.5   Liver Function Tests: No results for input(s): AST, ALT, ALKPHOS, BILITOT, PROT, ALBUMIN  in the last 168 hours. No results for input(s): LIPASE, AMYLASE in the last 168 hours. No results for input(s): AMMONIA in the last 168 hours. Cardiac Enzymes: No results for input(s): CKTOTAL, CKMB, CKMBINDEX, TROPONINI in the last 168 hours. BNP (last 3 results) Recent Labs    05/18/24 1552 07/06/24 1512 08/15/24 2127  BNP 53.2 >4,500.0* >4,500.0*   CBG: Recent Labs  Lab 08/16/24 1130 08/16/24 1634 08/16/24 2126 08/17/24 0746 08/17/24 1213  GLUCAP 132* 118* 144* 103* 108*    Time spent: 35 minutes  Signed:  Elvan Sor  [facility] 08/17/2024 4:02 PM

## 2024-08-17 NOTE — Progress Notes (Signed)
*  PRELIMINARY RESULTS* Echocardiogram 2D Echocardiogram has been performed.  [name] 08/17/2024, 8:53 AM

## 2024-08-17 NOTE — Care Management Obs Status (Signed)
 MEDICARE OBSERVATION STATUS NOTIFICATION   Patient Details  Name: [name] MRN: [id number] Date of Birth: [birth year]   Medicare Observation Status Notification Given:  Yes    [name] 08/17/2024, 2:19 PM

## 2024-08-17 NOTE — ED Notes (Signed)
 Pt resting in bed, denies any needs at this time, call bell within reach, NAD noted

## 2024-08-27 ENCOUNTER — Telehealth: Payer: Self-pay | Admitting: Family
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
## 2024-08-27 NOTE — Telephone Encounter (Signed)
 Called to confirm/remind patient of their appointment at the Advanced Heart Failure Clinic on 08/28/24.   Appointment:   [x] Confirmed  [] Left mess   [] No answer/No voice mail  [] VM Full/unable to leave message  [] Phone not in service  Patient reminded to bring all medications and/or complete list.  Confirmed patient has transportation. Gave directions, instructed to utilize valet parking.
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
## 2024-08-28 ENCOUNTER — Encounter: Admitting: Family

## 2024-08-28 ENCOUNTER — Telehealth: Payer: Self-pay | Admitting: Family

## 2024-08-28 NOTE — Telephone Encounter (Signed)
 Patient did not show for his Heart Failure Clinic appointment on 08/28/24.

## 2024-08-30 ENCOUNTER — Encounter: Payer: Self-pay | Admitting: Family

## 2024-08-30 ENCOUNTER — Ambulatory Visit: Attending: Family | Admitting: Family

## 2024-08-30 VITALS — BP 125/64 | HR 67 | Wt 206.0 lb

## 2024-08-30 DIAGNOSIS — I252 Old myocardial infarction: Secondary | ICD-10-CM | POA: Diagnosis not present

## 2024-08-30 DIAGNOSIS — N183 Chronic kidney disease, stage 3 unspecified: Secondary | ICD-10-CM | POA: Insufficient documentation

## 2024-08-30 DIAGNOSIS — Z79899 Other long term (current) drug therapy: Secondary | ICD-10-CM | POA: Insufficient documentation

## 2024-08-30 DIAGNOSIS — R0602 Shortness of breath: Secondary | ICD-10-CM | POA: Diagnosis present

## 2024-08-30 DIAGNOSIS — I2510 Atherosclerotic heart disease of native coronary artery without angina pectoris: Secondary | ICD-10-CM | POA: Diagnosis not present

## 2024-08-30 DIAGNOSIS — I255 Ischemic cardiomyopathy: Secondary | ICD-10-CM | POA: Insufficient documentation

## 2024-08-30 DIAGNOSIS — I5022 Chronic systolic (congestive) heart failure: Secondary | ICD-10-CM | POA: Insufficient documentation

## 2024-08-30 DIAGNOSIS — Z7901 Long term (current) use of anticoagulants: Secondary | ICD-10-CM | POA: Insufficient documentation

## 2024-08-30 DIAGNOSIS — Z794 Long term (current) use of insulin: Secondary | ICD-10-CM | POA: Diagnosis not present

## 2024-08-30 DIAGNOSIS — Z951 Presence of aortocoronary bypass graft: Secondary | ICD-10-CM | POA: Diagnosis not present

## 2024-08-30 DIAGNOSIS — Z950 Presence of cardiac pacemaker: Secondary | ICD-10-CM | POA: Diagnosis not present

## 2024-08-30 DIAGNOSIS — I340 Nonrheumatic mitral (valve) insufficiency: Secondary | ICD-10-CM | POA: Insufficient documentation

## 2024-08-30 DIAGNOSIS — I480 Paroxysmal atrial fibrillation: Secondary | ICD-10-CM | POA: Insufficient documentation

## 2024-08-30 NOTE — Patient Instructions (Signed)
 Medication Changes:  No medication changes.   Lab Work:  Go downstairs to [place] on LOWER LEV[address] to have your blood work completed.  We will only call you if the results are abnormal or if the provider would like to make medication changes.  No news is good news.   Special Instructions // Education:  It was good to see you today!   Follow-Up in: November 11th at 1:15 PM.   Thank you for choosing [facility].    At the Advanced Heart Failure Clinic, you and your health needs are our priority. We have a designated team specialized in the treatment of Heart Failure. This Care Team includes your primary Heart Failure Specialized Cardiologist (physician), Advanced Practice Providers (APPs- Physician Assistants and Nurse Practitioners), and Pharmacist who all work together to provide you with the care you need, when you need it.   You may see any of the following providers on your designated Care Team at your next follow up:  Dr. Toribio Fuel Dr. Ezra Shuck Dr. Ria Commander Dr. Morene Brownie Ellouise Class, FNP Jaun Bash, RPH-CPP  Please be sure to bring in all your medications bottles to every appointment.   Need to Contact Us :  If you have any questions or concerns before your next appointment please send us  a message through [place] or call our office at [phone].    TO LEAVE A MESSAGE FOR THE NURSE SELECT OPTION 2, PLEASE LEAVE A MESSAGE INCLUDING: YOUR NAME DATE OF BIRTH CALL BACK NUMBER REASON FOR CALL**this is important as we prioritize the call backs  YOU WILL RECEIVE A CALL BACK THE SAME DAY AS LONG AS YOU CALL BEFORE 4:00 PM'
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
## 2024-08-31 ENCOUNTER — Ambulatory Visit: Payer: Self-pay | Admitting: Family

## 2024-08-31 LAB — BASIC METABOLIC PANEL WITH GFR
BUN/Creatinine Ratio: 25 — ABNORMAL HIGH (ref 10–24)
BUN: 65 mg/dL — ABNORMAL HIGH (ref 8–27)
CO2: 24 mmol/L (ref 20–29)
Calcium: 8.1 mg/dL — ABNORMAL LOW (ref 8.6–10.2)
Chloride: 104 mmol/L (ref 96–106)
Creatinine, Ser: 2.62 mg/dL — ABNORMAL HIGH (ref 0.76–1.27)
Glucose: 147 mg/dL — ABNORMAL HIGH (ref 70–99)
Potassium: 4 mmol/L (ref 3.5–5.2)
Sodium: 145 mmol/L — ABNORMAL HIGH (ref 134–144)
eGFR: 23 mL/min/1.73 — ABNORMAL LOW (ref >59)

## 2024-09-04 MED ORDER — SACUBITRIL-VALSARTAN 24-26 MG PO TABS: 1.0000 | ORAL_TABLET | Freq: Two times a day (BID) | ORAL | 5 refills | Status: DC

## 2024-09-15 ENCOUNTER — Encounter: Payer: Self-pay | Admitting: Emergency Medicine

## 2024-09-15 ENCOUNTER — Other Ambulatory Visit: Payer: Self-pay

## 2024-09-15 ENCOUNTER — Observation Stay
Admission: EM | Admit: 2024-09-15 | Discharge: 2024-09-17 | Disposition: A | Discharge status: Home / Self Care | Attending: Internal Medicine | Admitting: Internal Medicine

## 2024-09-15 DIAGNOSIS — E1122 Type 2 diabetes mellitus with diabetic chronic kidney disease: Secondary | ICD-10-CM | POA: Insufficient documentation

## 2024-09-15 DIAGNOSIS — I442 Atrioventricular block, complete: Secondary | ICD-10-CM | POA: Insufficient documentation

## 2024-09-15 DIAGNOSIS — I428 Other cardiomyopathies: Secondary | ICD-10-CM | POA: Diagnosis not present

## 2024-09-15 DIAGNOSIS — K625 Hemorrhage of anus and rectum: Secondary | ICD-10-CM | POA: Diagnosis present

## 2024-09-15 DIAGNOSIS — I5020 Unspecified systolic (congestive) heart failure: Secondary | ICD-10-CM | POA: Diagnosis present

## 2024-09-15 DIAGNOSIS — Z951 Presence of aortocoronary bypass graft: Secondary | ICD-10-CM

## 2024-09-15 DIAGNOSIS — I5021 Acute systolic (congestive) heart failure: Secondary | ICD-10-CM | POA: Insufficient documentation

## 2024-09-15 DIAGNOSIS — I420 Dilated cardiomyopathy: Secondary | ICD-10-CM | POA: Diagnosis present

## 2024-09-15 DIAGNOSIS — N184 Chronic kidney disease, stage 4 (severe): Secondary | ICD-10-CM | POA: Insufficient documentation

## 2024-09-15 DIAGNOSIS — K921 Melena: Secondary | ICD-10-CM

## 2024-09-15 DIAGNOSIS — Z79899 Other long term (current) drug therapy: Secondary | ICD-10-CM | POA: Diagnosis not present

## 2024-09-15 DIAGNOSIS — I130 Hypertensive heart and chronic kidney disease with heart failure and stage 1 through stage 4 chronic kidney disease, or unspecified chronic kidney disease: Secondary | ICD-10-CM | POA: Insufficient documentation

## 2024-09-15 DIAGNOSIS — K922 Gastrointestinal hemorrhage, unspecified: Principal | ICD-10-CM | POA: Insufficient documentation

## 2024-09-15 DIAGNOSIS — I480 Paroxysmal atrial fibrillation: Secondary | ICD-10-CM | POA: Insufficient documentation

## 2024-09-15 DIAGNOSIS — E1129 Type 2 diabetes mellitus with other diabetic kidney complication: Secondary | ICD-10-CM | POA: Diagnosis present

## 2024-09-15 DIAGNOSIS — I2510 Atherosclerotic heart disease of native coronary artery without angina pectoris: Secondary | ICD-10-CM | POA: Insufficient documentation

## 2024-09-15 LAB — CBC
HCT: 43.1 % (ref 39.0–52.0)
Hemoglobin: 13.4 g/dL (ref 13.0–17.0)
MCH: 28 pg (ref 26.0–34.0)
MCHC: 31.1 g/dL (ref 30.0–36.0)
MCV: 90.2 fL (ref 80.0–100.0)
Platelets: 135 K/uL — ABNORMAL LOW (ref 150–400)
RBC: 4.78 MIL/uL (ref 4.22–5.81)
RDW: 15.9 % — ABNORMAL HIGH (ref 11.5–15.5)
WBC: 3.7 K/uL — ABNORMAL LOW (ref 4.0–10.5)
nRBC: 0 % (ref 0.0–0.2)

## 2024-09-15 LAB — TYPE AND SCREEN
ABO/RH(D): O POS
Antibody Screen: NEGATIVE

## 2024-09-15 LAB — COMPREHENSIVE METABOLIC PANEL WITH GFR
ALT: 12 U/L (ref 0–44)
AST: 20 U/L (ref 15–41)
Albumin: 3.4 g/dL — ABNORMAL LOW (ref 3.5–5.0)
Alkaline Phosphatase: 70 U/L (ref 38–126)
Anion gap: 14 (ref 5–15)
BUN: 67 mg/dL — ABNORMAL HIGH (ref 8–23)
CO2: 26 mmol/L (ref 22–32)
Calcium: 7.7 mg/dL — ABNORMAL LOW (ref 8.9–10.3)
Chloride: 104 mmol/L (ref 98–111)
Creatinine, Ser: 2.58 mg/dL — ABNORMAL HIGH (ref 0.61–1.24)
GFR, Estimated: 23 mL/min — ABNORMAL LOW (ref >60)
Glucose, Bld: 103 mg/dL — ABNORMAL HIGH (ref 70–99)
Potassium: 3.8 mmol/L (ref 3.5–5.1)
Sodium: 144 mmol/L (ref 135–145)
Total Bilirubin: 0.8 mg/dL (ref 0.0–1.2)
Total Protein: 7.4 g/dL (ref 6.5–8.1)

## 2024-09-15 NOTE — ED Triage Notes (Signed)
 Pt reports bright red bloody stools x 1 episode starting tonight

## 2024-09-16 DIAGNOSIS — I420 Dilated cardiomyopathy: Secondary | ICD-10-CM

## 2024-09-16 DIAGNOSIS — Z794 Long term (current) use of insulin: Secondary | ICD-10-CM

## 2024-09-16 DIAGNOSIS — N1832 Chronic kidney disease, stage 3b: Secondary | ICD-10-CM

## 2024-09-16 DIAGNOSIS — K921 Melena: Secondary | ICD-10-CM

## 2024-09-16 DIAGNOSIS — N184 Chronic kidney disease, stage 4 (severe): Secondary | ICD-10-CM | POA: Insufficient documentation

## 2024-09-16 DIAGNOSIS — I5020 Unspecified systolic (congestive) heart failure: Secondary | ICD-10-CM

## 2024-09-16 DIAGNOSIS — K922 Gastrointestinal hemorrhage, unspecified: Principal | ICD-10-CM | POA: Diagnosis present

## 2024-09-16 DIAGNOSIS — I480 Paroxysmal atrial fibrillation: Secondary | ICD-10-CM

## 2024-09-16 DIAGNOSIS — E1122 Type 2 diabetes mellitus with diabetic chronic kidney disease: Secondary | ICD-10-CM

## 2024-09-16 DIAGNOSIS — I442 Atrioventricular block, complete: Secondary | ICD-10-CM

## 2024-09-16 DIAGNOSIS — I2510 Atherosclerotic heart disease of native coronary artery without angina pectoris: Secondary | ICD-10-CM

## 2024-09-16 LAB — CBC
HCT: 42.5 % (ref 39.0–52.0)
HCT: 40.9 % (ref 39.0–52.0)
HCT: 41 % (ref 39.0–52.0)
Hemoglobin: 13.4 g/dL (ref 13.0–17.0)
Hemoglobin: 12.7 g/dL — ABNORMAL LOW (ref 13.0–17.0)
Hemoglobin: 12.9 g/dL — ABNORMAL LOW (ref 13.0–17.0)
MCH: 28.5 pg (ref 26.0–34.0)
MCH: 28 pg (ref 26.0–34.0)
MCH: 28 pg (ref 26.0–34.0)
MCHC: 31.5 g/dL (ref 30.0–36.0)
MCHC: 31.1 g/dL (ref 30.0–36.0)
MCHC: 31.5 g/dL (ref 30.0–36.0)
MCV: 90.2 fL (ref 80.0–100.0)
MCV: 90.1 fL (ref 80.0–100.0)
MCV: 88.9 fL (ref 80.0–100.0)
Platelets: 138 K/uL — ABNORMAL LOW (ref 150–400)
Platelets: 133 K/uL — ABNORMAL LOW (ref 150–400)
Platelets: 126 K/uL — ABNORMAL LOW (ref 150–400)
RBC: 4.71 MIL/uL (ref 4.22–5.81)
RBC: 4.54 MIL/uL (ref 4.22–5.81)
RBC: 4.61 MIL/uL (ref 4.22–5.81)
RDW: 15.8 % — ABNORMAL HIGH (ref 11.5–15.5)
RDW: 15.8 % — ABNORMAL HIGH (ref 11.5–15.5)
RDW: 15.9 % — ABNORMAL HIGH (ref 11.5–15.5)
WBC: 4 K/uL (ref 4.0–10.5)
WBC: 3.7 K/uL — ABNORMAL LOW (ref 4.0–10.5)
WBC: 3.5 K/uL — ABNORMAL LOW (ref 4.0–10.5)
nRBC: 0 % (ref 0.0–0.2)
nRBC: 0 % (ref 0.0–0.2)
nRBC: 0 % (ref 0.0–0.2)

## 2024-09-16 LAB — GLUCOSE, CAPILLARY
Glucose-Capillary: 121 mg/dL — ABNORMAL HIGH (ref 70–99)
Glucose-Capillary: 110 mg/dL — ABNORMAL HIGH (ref 70–99)
Glucose-Capillary: 104 mg/dL — ABNORMAL HIGH (ref 70–99)
Glucose-Capillary: 107 mg/dL — ABNORMAL HIGH (ref 70–99)
Glucose-Capillary: 120 mg/dL — ABNORMAL HIGH (ref 70–99)
Glucose-Capillary: 158 mg/dL — ABNORMAL HIGH (ref 70–99)

## 2024-09-16 LAB — TROPONIN I (HIGH SENSITIVITY)
Troponin I (High Sensitivity): 24 ng/L — ABNORMAL HIGH (ref <18)
Troponin I (High Sensitivity): 26 ng/L — ABNORMAL HIGH (ref <18)

## 2024-09-16 LAB — PROTIME-INR
INR: 1.2 (ref 0.8–1.2)
Prothrombin Time: 15.6 s — ABNORMAL HIGH (ref 11.4–15.2)

## 2024-09-16 MED ORDER — PANTOPRAZOLE SODIUM 40 MG IV SOLR
40.0000 mg | Freq: Two times a day (BID) | INTRAVENOUS | Status: DC
  Administered 2024-09-16 – 2024-09-17 (×3): 40 mg via INTRAVENOUS
  Filled 2024-09-16 (×3): qty 10

## 2024-09-16 MED ORDER — ONDANSETRON HCL 4 MG/2ML IJ SOLN: 4.0000 mg | Freq: Four times a day (QID) | INTRAMUSCULAR | Status: DC | PRN

## 2024-09-16 MED ORDER — INSULIN ASPART 100 UNIT/ML IJ SOLN
0.0000 [IU] | INTRAMUSCULAR | Status: DC
  Administered 2024-09-16: 2 [IU] via SUBCUTANEOUS
  Administered 2024-09-16: 3 [IU] via SUBCUTANEOUS
  Administered 2024-09-17: 2 [IU] via SUBCUTANEOUS
  Filled 2024-09-16: qty 2
  Filled 2024-09-16 (×2): qty 1

## 2024-09-16 MED ORDER — PANTOPRAZOLE SODIUM 40 MG IV SOLR
80.0000 mg | Freq: Once | INTRAVENOUS | Status: AC
  Administered 2024-09-16: 80 mg via INTRAVENOUS
  Filled 2024-09-16: qty 20

## 2024-09-16 MED ORDER — CARVEDILOL 6.25 MG PO TABS
6.2500 mg | ORAL_TABLET | Freq: Two times a day (BID) | ORAL | Status: DC
  Administered 2024-09-16 – 2024-09-17 (×3): 6.25 mg via ORAL
  Filled 2024-09-16 (×3): qty 1

## 2024-09-16 MED ORDER — HYDROCODONE-ACETAMINOPHEN 5-325 MG PO TABS
1.0000 | ORAL_TABLET | ORAL | Status: DC | PRN
  Administered 2024-09-17: 2 via ORAL
  Filled 2024-09-16: qty 2

## 2024-09-16 MED ORDER — SIMETHICONE 80 MG PO CHEW
80.0000 mg | CHEWABLE_TABLET | Freq: Four times a day (QID) | ORAL | Status: DC | PRN
  Administered 2024-09-16 – 2024-09-17 (×3): 80 mg via ORAL
  Filled 2024-09-16 (×3): qty 1

## 2024-09-16 MED ORDER — ACETAMINOPHEN 325 MG PO TABS: 650.0000 mg | ORAL_TABLET | Freq: Four times a day (QID) | ORAL | Status: DC | PRN

## 2024-09-16 MED ORDER — DAPAGLIFLOZIN PROPANEDIOL 10 MG PO TABS
10.0000 mg | ORAL_TABLET | Freq: Every day | ORAL | Status: DC
  Administered 2024-09-16 – 2024-09-17 (×2): 10 mg via ORAL
  Filled 2024-09-16 (×2): qty 1

## 2024-09-16 MED ORDER — ONDANSETRON HCL 4 MG PO TABS
4.0000 mg | ORAL_TABLET | Freq: Four times a day (QID) | ORAL | Status: DC | PRN
  Administered 2024-09-16: 4 mg via ORAL
  Filled 2024-09-16: qty 1

## 2024-09-16 MED ORDER — ROSUVASTATIN CALCIUM 10 MG PO TABS
40.0000 mg | ORAL_TABLET | Freq: Every day | ORAL | Status: DC
  Administered 2024-09-16 – 2024-09-17 (×2): 40 mg via ORAL
  Filled 2024-09-16 (×2): qty 4

## 2024-09-16 MED ORDER — ACETAMINOPHEN 650 MG RE SUPP
650.0000 mg | Freq: Four times a day (QID) | RECTAL | Status: DC | PRN
Start: 2024-09-16 — End: 2024-09-17

## 2024-09-16 MED ORDER — SODIUM CHLORIDE 0.9 % IV SOLN: INTRAVENOUS | Status: DC

## 2024-09-16 NOTE — ED Notes (Signed)
 Pt was given a rectal exam by Ward, MD & [place], RN . This NT assisted with changing his linen and clothing as he had stool on his clothes from home. His belongings were put in a bag and sat on the chair beside the family member. The pt was also put into a brief incase of future incontinence. Pt was given warm blankets and the falls bundle was put in place. No other needs at this time .

## 2024-09-16 NOTE — H&P (Signed)
 History and Physical    Patient: [name] FMW:[id number] DOB: 1935-01-18 DOA: 09/15/2024 DOS: the patient was seen and examined on 09/16/2024 PCP: [name], MD  Patient coming from: Home  Chief Complaint:  Chief Complaint  Patient presents with   Rectal Bleeding    HPI: [name] is a 89 y.o. male with medical history significant for PAF on Eliquis , CAD s/p CABG x5 and multiple stents, HFrEF secondary to ischemic cardiomyopathy (EF 25-30% 12/2023), heart block s/p pacemaker, CKD stage IV, being admitted with GI bleed, currently hemodynamically stable.  Patient started having maroon-colored stool on the day of arrival which eventually became black.  He denies abdominal pain, vomiting has no history of alcohol use and is not currently on NSAIDs.  Has a previously normal colonoscopy. In the ED vitals within normal limits slightly bradycardic 50s to 60s. Hemoglobin 13.4 with platelets 135 Creatinine 2 .58 which is about baseline, . mild hypocalcemia of 7.7 No EKG or imaging done  Patient started on IV Protonix  and started on an NS infusion  Admission requested.     Past Medical History:  Diagnosis Date   AV block, [place] 1    CAD (coronary artery disease)    a. 08/2019 VF arrest-->sev 3vd on cath-->CABGx5 (LIMA->LAD, VG->OM->LCX, VG->RPDA, VG->Diag); b. 08/2021 Cath: sev native multivessel dzs w/ 5/5 patent grafts. Nl filling pressures->Med rx; c. 05/2023 Cath: stable anatomy->Med rx.   CKD (chronic kidney disease), stage III (HCC)    Diabetes mellitus without complication (HCC)    HFrEF (heart failure with reduced ejection fraction) (HCC)    a. 08/2020 Echo: EF 40-45%; b. 11/2020 Echo: EF 55%, Gr2 DD; c. 08/2021 Echo: EF 35-40%, glob HK w/ ant/antsept/apical HK; d. 10/2021 s/p CRT-P; e. 12/2021 Echo: EF 30-35%, glob HK, GrIII DD; f. 12/2022 Echo: EF 30-35%, glob HK, sev HK of ant wall, mod asymm LVH, nl RV fxn, mildly dil LA, triv MR, AoV sclerosis.   Hyperlipidemia LDL  goal <70    Hypertension    Intermittent Complete heart block (HCC)    a. 08/2021 noted on Zio; b. 10/2021 s/p Abbott Allure RF CRT-P (Ser # [id number]).   Ischemic cardiomyopathy    a. 08/2020 Echo: EF 40-45%; b. 11/2020 Echo: EF 55%; c. 08/2021 Echo: EF 35-40%; d. 10/2021 s/p CRT-P; e. 12/2021 Echo: EF 30-35%, glob HK, GrIII DD.   PAF (paroxysmal atrial fibrillation) (HCC)    a.Medical device check-->Eliquis  (CHA2DS2VASc = 6)   Trifascicular block    Past Surgical History:  Procedure Laterality Date   BACK SURGERY     CARDIAC CATHETERIZATION     CLIPPING OF ATRIAL APPENDAGE N/A 08/29/2020   Procedure: CLIPPING OF ATRIAL APPENDAGE USING ATRICURE 45 MM ATRICLIP FLEX-V;  Surgeon: Army Dallas NOVAK, MD;  Location: [facility] OR;  Service: Open Heart Surgery;  Laterality: N/A;   CORONARY ARTERY BYPASS GRAFT N/A 08/29/2020   Procedure: CORONARY ARTERY BYPASS GRAFTING (CABG) X 5 USING LEFT INTERNAL MAMMARY ARTERY AND ENDOSCOPICALLY HARVESTED RIGHT GREATER SAPHENOUS VEIN. LIMA TO LAD, SVG TO OM SEQ TO CIRC, SVG TO PD, SVG TO DIAG.;  Surgeon: Army Dallas NOVAK, MD;  Location: [facility] OR;  Service: Open Heart Surgery;  Laterality: N/A;   ENDOVEIN HARVEST OF GREATER SAPHENOUS VEIN Right 08/29/2020   Procedure: ENDOVEIN HARVEST OF GREATER SAPHENOUS VEIN;  Surgeon: Army Dallas NOVAK, MD;  Location: [facility] OR;  Service: Open Heart Surgery;  Laterality: Right;   HERNIA REPAIR     LEFT HEART CATH AND CORONARY ANGIOGRAPHY  N/A 08/25/2020   Procedure: LEFT HEART CATH AND CORONARY ANGIOGRAPHY;  Surgeon: Darron Deatrice LABOR, MD;  Location: [facility] INVASIVE CV LAB;  Service: Cardiovascular;  Laterality: N/A;   PACEMAKER IMPLANT N/A 10/12/2021   Procedure: PACEMAKER IMPLANT;  Surgeon: Cindie Ole DASEN, MD;  Location: [facility] INVASIVE CV LAB;  Service: Cardiovascular;  Laterality: N/A;   RIGHT/LEFT HEART CATH AND CORONARY ANGIOGRAPHY N/A 08/31/2021   Procedure: RIGHT/LEFT HEART CATH AND CORONARY ANGIOGRAPHY;  Surgeon: Darron Deatrice LABOR,  MD;  Location: [facility] INVASIVE CV LAB;  Service: Cardiovascular;  Laterality: N/A;   RIGHT/LEFT HEART CATH AND CORONARY/GRAFT ANGIOGRAPHY N/A 06/06/2023   Procedure: RIGHT/LEFT HEART CATH AND CORONARY/GRAFT ANGIOGRAPHY;  Surgeon: Darron Deatrice LABOR, MD;  Location: [facility] INVASIVE CV LAB;  Service: Cardiovascular;  Laterality: N/A;   TEE WITHOUT CARDIOVERSION N/A 08/29/2020   Procedure: TRANSESOPHAGEAL ECHOCARDIOGRAM (TEE);  Surgeon: Army Dallas NOVAK, MD;  Location: [facility] OR;  Service: Open Heart Surgery;  Laterality: N/A;   Social History:  reports that he quit smoking about 28 years ago. His smoking use included cigarettes. He has never used smokeless tobacco. He reports that he does not currently use alcohol. He reports that he does not use drugs.  Allergies  Allergen Reactions   Angiotensin Receptor Blockers     hyperkalemia   Metformin Diarrhea    Family History  Problem Relation Age of Onset   Heart attack Mother     Prior to Admission medications   Medication Sig Start Date End Date Taking? Authorizing Provider  acetaminophen  (TYLENOL ) 500 MG tablet Take 1,000 mg by mouth every 6 (six) hours as needed for mild pain, fever or headache.    [provider]  acyclovir  (ZOVIRAX ) 800 MG tablet Take 800 mg by mouth daily.    [provider]  apixaban  (ELIQUIS ) 2.5 MG TABS tablet TAKE 1 TABLET BY MOUTH TWICE A DAY 07/24/24   Terra Fairy PARAS, PA-C  carvedilol  (COREG ) 6.25 MG tablet TAKE 1 TABLET BY MOUTH TWICE A DAY WITH FOOD 07/24/24   Zenaida Morene PARAS, MD  dapagliflozin  propanediol (FARXIGA ) 10 MG TABS tablet Take 1 tablet (10 mg total) by mouth daily. 12/27/23   Awanda [place], MD  ezetimibe  (ZETIA ) 10 MG tablet TAKE 1 TABLET BY MOUTH EVERY DAY 06/29/24   McLean, Dalton S, MD  Insulin  Glargine (BASAGLAR  [facility]) 100 UNIT/ML Inject 15 Units into the skin at bedtime. 02/19/24 02/18/25  [provider]  magnesium  oxide (MAG-OX) 400 MG tablet Take 1 tablet (400 mg total) by mouth  daily. 01/20/22   Cindie Ole DASEN, MD  metolazone  (ZAROXOLYN ) 2.5 MG tablet Take 2.5 mg by mouth 2 (two) times a week. Every Tuesday and Friday    [provider]  pantoprazole  (PROTONIX ) 40 MG tablet Take 40 mg by mouth daily.    [provider]  polyethylene glycol (MIRALAX  / GLYCOLAX ) 17 g packet Take 17 g by mouth daily as needed for moderate constipation. 12/19/22   Josette Ade, MD  potassium chloride  SA (KLOR-CON  M) 20 MEQ tablet Take 2 tablets (40 mEq total) by mouth daily. 04/20/24   [place]ette [place] LABOR, FNP  rosuvastatin  (CRESTOR ) 40 MG tablet Take 1 tablet (40 mg total) by mouth daily. 05/25/23   Gerard Frederick, NP  sacubitril -valsartan  (ENTRESTO ) 24-26 MG Take 1 tablet by mouth 2 (two) times daily. 09/04/24   [place]ette [place] LABOR, FNP  spironolactone  (ALDACTONE ) 25 MG tablet Take 1 tablet (25 mg total) by mouth daily. 04/19/24 08/30/24  [place]ette [place] LABOR, FNP  tamsulosin  (FLOMAX ) 0.4 MG CAPS capsule Take 0.4 mg by mouth daily.    [provider]  torsemide  (DEMADEX ) 20 MG tablet Take 40-60 mg by mouth daily. Alternating days 40 mg and 60 mg 07/11/24   [provider]    Physical Exam: Vitals:   09/15/24 2046 09/15/24 2051 09/16/24 0053  BP:  127/78 (!) 144/98  Pulse:  67 (!) 58  Resp:  16 20  Temp:  97.8 F (36.6 C) 97.7 F (36.5 C)  TempSrc:  Oral Oral  SpO2:  99% 98%  Weight: 93.4 kg    Height: 5' 8 (1.727 m)     Physical Exam Vitals and nursing note reviewed.  Constitutional:      General: He is not in acute distress.    Comments: Patient awake and alert and appears comfortable  HENT:     Head: Normocephalic and atraumatic.  Cardiovascular:     Rate and Rhythm: Normal rate and regular rhythm.     Heart sounds: Normal heart sounds.  Pulmonary:     Effort: Pulmonary effort is normal.     Breath sounds: Normal breath sounds.  Abdominal:     Palpations: Abdomen is soft.     Tenderness: There is no abdominal tenderness.  Neurological:      Mental Status: Mental status is at baseline.     Labs on Admission: I have personally reviewed following labs and imaging studies  CBC: Recent Labs  Lab 09/15/24 2053  WBC 3.7*  HGB 13.4  HCT 43.1  MCV 90.2  PLT 135*   Basic Metabolic Panel: Recent Labs  Lab 09/15/24 2053  NA 144  K 3.8  CL 104  CO2 26  GLUCOSE 103*  BUN 67*  CREATININE 2.58*  CALCIUM  7.7*   GFR: Estimated Creatinine Clearance: 22.4 mL/min (A) (by C-G formula based on SCr of 2.58 mg/dL (H)). Liver Function Tests: Recent Labs  Lab 09/15/24 2053  AST 20  ALT 12  ALKPHOS 70  BILITOT 0.8  PROT 7.4  ALBUMIN  3.4*   No results for input(s): LIPASE, AMYLASE in the last 168 hours. No results for input(s): AMMONIA in the last 168 hours. Coagulation Profile: No results for input(s): INR, PROTIME in the last 168 hours. Cardiac Enzymes: No results for input(s): CKTOTAL, CKMB, CKMBINDEX, TROPONINI in the last 168 hours. BNP (last 3 results) No results for input(s): PROBNP in the last 8760 hours. HbA1C: No results for input(s): HGBA1C in the last 72 hours. CBG: No results for input(s): GLUCAP in the last 168 hours. Lipid Profile: No results for input(s): CHOL, HDL, LDLCALC, TRIG, CHOLHDL, LDLDIRECT in the last 72 hours. Thyroid Function Tests: No results for input(s): TSH, T4TOTAL, FREET4, T3FREE, THYROIDAB in the last 72 hours. Anemia Panel: No results for input(s): VITAMINB12, FOLATE, FERRITIN, TIBC, IRON, RETICCTPCT in the last 72 hours. Urine analysis:    Component Value Date/Time   COLORURINE YELLOW (A) 03/23/2024 2042   APPEARANCEUR HAZY (A) 03/23/2024 2042   LABSPEC 1.015 03/23/2024 2042   PHURINE 6.0 03/23/2024 2042   GLUCOSEU >=500 (A) 03/23/2024 2042   HGBUR SMALL (A) 03/23/2024 2042   BILIRUBINUR NEGATIVE 03/23/2024 2042   KETONESUR NEGATIVE 03/23/2024 2042   PROTEINUR 100 (A) 03/23/2024 2042   NITRITE NEGATIVE 03/23/2024  2042   LEUKOCYTESUR LARGE (A) 03/23/2024 2042    Radiological Exams on Admission: No results found. Data Reviewed for HPI: Relevant notes from primary care and specialist visits, past discharge summaries as available in EHR, including Care  Everywhere. Prior diagnostic testing as pertinent to current admission diagnoses Updated medications and problem lists for reconciliation ED course, including vitals, labs, imaging, treatment and response to treatment Triage notes, nursing and pharmacy notes and ED provider's notes Notable results as noted above in HPI      Assessment and Plan: * GI bleed Melena and hematochezia Patient reports maroon-colored stools subsequently melena Patient is on Eliquis .  Denies alcohol or NSAID use Hemodynamically stable with hemoglobin above 13, not tachycardic, SBP 140 Close hemodynamic monitoring Cautious IV hydration Serial H&H and transfuse if necessary Hold Eliquis  N.p.o. except sips and ice chips GI consult  HFrEF (heart failure with reduced ejection fraction) (HCC) Dilated cardiomyopathy, EF 25 to 30% 12/2023 Clinically euvolemic In the setting of acute GI bleed, will hold spironolactone , torsemide , Entresto  and metolazone  We will cautiously continue carvedilol  with close monitoring of blood pressure Daily weights as patient will be on IV hydration given blood loss Monitor for decompensation in view of hydration  CAD, s/p CABG x 5 Cautiously continue carvedilol  in the setting of GI bleed Hold apixaban  Continue rosuvastatin   Paroxysmal atrial fibrillation (HCC) Hold Eliquis  in the setting of acute GI bleed Continue Coreg  with close hemodynamic monitoring  Type II diabetes mellitus with renal manifestations (HCC) Sliding scale insulin  coverage  CKD (chronic kidney disease), stage IV (HCC) Creatinine at baseline around 2.58  Complete heart block s/p pacemaker (HCC) No acute issues suspected        DVT prophylaxis:  SCD  Consults: GI, Dr Onita  Advance Care Planning:   Code Status: Prior   Family Communication: Wife at bedside  Disposition Plan: Back to previous home environment  Severity of Illness: The appropriate patient status for this patient is OBSERVATION. Observation status is judged to be reasonable and necessary in order to provide the required intensity of service to ensure the patient's safety. The patient's presenting symptoms, physical exam findings, and initial radiographic and laboratory data in the context of their medical condition is felt to place them at decreased risk for further clinical deterioration. Furthermore, it is anticipated that the patient will be medically stable for discharge from the hospital within 2 midnights of admission.   Author: Delayne LULLA Solian, MD 09/16/2024 1:58 AM  For on call review [email or web address].

## 2024-09-16 NOTE — Progress Notes (Signed)
 Progress Note   Patient: [name] FMW:[id number] DOB: [birth year] DOA: 09/15/2024     0 DOS: the patient was seen and examined on 09/16/2024   Brief hospital course: Partly taken from H&P.  [name] is a 89 y.o. male with medical history significant for PAF on Eliquis , CAD s/p CABG x5 and multiple stents, HFrEF secondary to ischemic cardiomyopathy (EF 25-30% 12/2023), heart block s/p pacemaker, CKD stage IV, being admitted with GI bleed. Patient started having maroon-colored stool on the day of arrival which eventually became black.   Denies any NSAID use.  Has a previously normal colonoscopy.  On presentation vital stable, hemoglobin of 13.4, platelet 135, creatinine 2.58 which is around his baseline.   Eliquis  was held and patient was started on IV Protonix .  GI was consulted.  Hemoglobin remained stable.  11/9: Vital stable with mildly elevated blood pressure at 160/53, epigastric and left upper quadrant pain going to the left side of the chest, improved with burping-likely GI in origin shared but will check troponin to rule out cardiac cause.  Hemoglobin stable with no BM since in the hospital.  GI would like to wait for at least 48 hours before deciding about EGD.  Last Eliquis  dose was on 11/8 PM, currently being held.  Assessment and Plan: * GI bleed Melena and hematochezia Patient reports maroon-colored stools subsequently melena Patient is on Eliquis .  Denies alcohol or NSAID use Hemodynamically stable with hemoglobin remained above 13, GI would like to wait for 48 hours after the last dose of Eliquis  which was on 11/8 PM before deciding about EGD No further reported BM or melena Started on clear liquid diet Continue holding Eliquis  Continue with IV Protonix  Monitor hemoglobin  HFrEF (heart failure with reduced ejection fraction) (HCC) Dilated cardiomyopathy, EF 25 to 30% 12/2023 Clinically euvolemic In the setting of acute GI bleed, will hold spironolactone ,  torsemide , Entresto  and metolazone  Continue carvedilol  with close monitoring of blood pressure Daily weights  Monitor for decompensation in view of hydration  CAD, s/p CABG x 5 Cautiously continue carvedilol  in the setting of GI bleed Hold apixaban  Continue rosuvastatin  Checking troponin as he was having some nonspecific left-sided chest pain.  Paroxysmal atrial fibrillation (HCC) Hold Eliquis  in the setting of acute GI bleed Continue Coreg  with close hemodynamic monitoring  Type II diabetes mellitus with renal manifestations (HCC) Sliding scale insulin  coverage  CKD (chronic kidney disease), stage IV (HCC) Creatinine at baseline around 2.58  Complete heart block s/p pacemaker (HCC) No acute issues suspected   Subjective: Patient was complaining of some epigastric and left upper quadrant pain radiating to the left chest, no shortness of breath, no nausea or vomiting.  Did not had any BM since in the hospital.  Pain improved with burping.  Physical Exam: Vitals:   09/16/24 0440 09/16/24 0849 09/16/24 0854 09/16/24 1150  BP: 137/84 138/81 (!) 160/53 122/84  Pulse:  67 70 71  Resp: 18 19 20 18   Temp: 97.6 F (36.4 C) 97.7 F (36.5 C) 98.2 F (36.8 C) 97.8 F (36.6 C)  TempSrc: Axillary Oral Oral Oral  SpO2: 99% 99% 92% 98%  Weight:      Height:       General.  Obese elderly man, in no acute distress. Pulmonary.  Lungs clear bilaterally, normal respiratory effort. CV.  Regular rate and rhythm, no JVD, rub or murmur. Abdomen.  Soft, nontender, nondistended, BS positive. CNS.  Alert and oriented .  No focal neurologic deficit. Extremities.  No  edema, no cyanosis, pulses intact and symmetrical. Psychiatry.  Judgment and insight appears normal.   Data Reviewed: Prior data reviewed  Family Communication: Talked with daughter on phone.  Disposition: Status is: Observation The patient will require care spanning > 2 midnights and should be moved to inpatient because:  Severity of illness  Planned Discharge Destination: Home  DVT prophylaxis.  SCDs Time spent: 50 minutes  Author: Amaryllis Dare, MD 09/16/2024 3:19 PM  For on call review [email or web address].

## 2024-09-16 NOTE — Assessment & Plan Note (Signed)
 Hold Eliquis  in the setting of acute GI bleed Continue Coreg  with close hemodynamic monitoring

## 2024-09-16 NOTE — Assessment & Plan Note (Signed)
 Sliding scale insulin  coverage

## 2024-09-16 NOTE — Plan of Care (Signed)
  Problem: Education: Goal: Ability to describe self-care measures that may prevent or decrease complications (Diabetes Survival Skills Education) will improve Outcome: Progressing Goal: Individualized Educational Video(s) Outcome: Progressing   Problem: Coping: Goal: Ability to adjust to condition or change in health will improve Outcome: Progressing   Problem: Fluid Volume: Goal: Ability to maintain a balanced intake and output will improve Outcome: Progressing   Problem: Health Behavior/Discharge Planning: Goal: Ability to identify and utilize available resources and services will improve Outcome: Progressing Goal: Ability to manage health-related needs will improve Outcome: Progressing   Problem: Skin Integrity: Goal: Risk for impaired skin integrity will decrease Outcome: Progressing   Problem: Tissue Perfusion: Goal: Adequacy of tissue perfusion will improve Outcome: Progressing   Problem: Education: Goal: Knowledge of General Education information will improve Description: Including pain rating scale, medication(s)/side effects and non-pharmacologic comfort measures Outcome: Progressing   Problem: Health Behavior/Discharge Planning: Goal: Ability to manage health-related needs will improve Outcome: Progressing   Problem: Clinical Measurements: Goal: Will remain free from infection Outcome: Progressing Goal: Respiratory complications will improve Outcome: Progressing Goal: Cardiovascular complication will be avoided Outcome: Progressing   Problem: Activity: Goal: Risk for activity intolerance will decrease Outcome: Progressing   Problem: Nutrition: Goal: Adequate nutrition will be maintained Outcome: Progressing   Problem: Coping: Goal: Level of anxiety will decrease Outcome: Progressing   Problem: Elimination: Goal: Will not experience complications related to bowel motility Outcome: Progressing Goal: Will not experience complications related to  urinary retention Outcome: Progressing   Problem: Pain Managment: Goal: General experience of comfort will improve and/or be controlled Outcome: Progressing   Problem: Safety: Goal: Ability to remain free from injury will improve Outcome: Progressing   Problem: Skin Integrity: Goal: Risk for impaired skin integrity will decrease Outcome: Progressing   Problem: Metabolic: Goal: Ability to maintain appropriate glucose levels will improve Outcome: Not Progressing   Problem: Nutritional: Goal: Maintenance of adequate nutrition will improve Outcome: Not Progressing Goal: Progress toward achieving an optimal weight will improve Outcome: Not Progressing   Problem: Clinical Measurements: Goal: Ability to maintain clinical measurements within normal limits will improve Outcome: Not Progressing Goal: Diagnostic test results will improve Outcome: Not Progressing

## 2024-09-16 NOTE — Consult Note (Signed)
 Inpatient Consultation   Patient ID: [name] is a 89 y.o. male.  Requesting Provider: Delayne Solian, MD  Date of Admission: 09/15/2024  Date of Consult: 09/16/24   Reason for Consultation: GIB   Patient's Chief Complaint:   Chief Complaint  Patient presents with   Rectal Bleeding    89 year old African-American, CAD status post CABG male with significant HFrEF 25 to 30%, A-fib on Eliquis , complete heart block status post PPM, CKD 4 admitted for reported GI bleeding.  GI is consulted for GI bleeding.  Patient has noted over the last week some maroon-colored stool which eventually turned black.  Prior to yesterday these were normal and brown.  He denies any NSAID denies Pepto-Bismol use.  Use.  He last took his Eliquis  on the evening of 11 8.  Hemoglobin is at baseline at 13 and unchanged during his hospitalization despite reported bleeding.  No nausea or vomiting.  He does note some left-sided chest discomfort which he attributes to needing to belch as this relieves his discomfort.  No dysphagia or odynophagia.  Denies NSAIDs, Anti-plt agents Denies family history of gastrointestinal disease and malignancy Previous Endoscopies: Colonoscopy 2004 At [facility] reportedly normal EGD 08/15/20 at [facility]    Past Medical History:  Diagnosis Date   AV block, Mobitz 1    CAD (coronary artery disease)    a. 08/2019 VF arrest-->sev 3vd on cath-->CABGx5 (LIMA->LAD, VG->OM->LCX, VG->RPDA, VG->Diag); b. 08/2021 Cath: sev native multivessel dzs w/ 5/5 patent grafts. Nl filling pressures->Med rx; c. 05/2023 Cath: stable anatomy->Med rx.   CKD (chronic kidney disease), stage III (HCC)    Diabetes mellitus without complication (HCC)    HFrEF (heart failure with reduced ejection fraction) (HCC)    a. 08/2020 Echo: EF 40-45%; b. 11/2020 Echo: EF 55%, Gr2 DD; c. 08/2021 Echo: EF 35-40%, glob HK w/ ant/antsept/apical HK; d. 10/2021 s/p CRT-P; e. 12/2021 Echo: EF 30-35%, glob HK, GrIII DD; f. 12/2022 Echo:  EF 30-35%, glob HK, sev HK of ant wall, mod asymm LVH, nl RV fxn, mildly dil LA, triv MR, AoV sclerosis.   Hyperlipidemia LDL goal <70    Hypertension    Intermittent Complete heart block (HCC)    a. 08/2021 noted on Zio; b. 10/2021 s/p Abbott Allure RF CRT-P (Ser # [id number]).   Ischemic cardiomyopathy    a. 08/2020 Echo: EF 40-45%; b. 11/2020 Echo: EF 55%; c. 08/2021 Echo: EF 35-40%; d. 10/2021 s/p CRT-P; e. 12/2021 Echo: EF 30-35%, glob HK, GrIII DD.   PAF (paroxysmal atrial fibrillation) (HCC)    a.Medical device check-->Eliquis  (CHA2DS2VASc = 6)   Trifascicular block     Past Surgical History:  Procedure Laterality Date   BACK SURGERY     CARDIAC CATHETERIZATION     CLIPPING OF ATRIAL APPENDAGE N/A 08/29/2020   Procedure: CLIPPING OF ATRIAL APPENDAGE USING ATRICURE 45 MM ATRICLIP FLEX-V;  Surgeon: Army Dallas NOVAK, MD;  Location: [facility] OR;  Service: Open Heart Surgery;  Laterality: N/A;   CORONARY ARTERY BYPASS GRAFT N/A 08/29/2020   Procedure: CORONARY ARTERY BYPASS GRAFTING (CABG) X 5 USING LEFT INTERNAL MAMMARY ARTERY AND ENDOSCOPICALLY HARVESTED RIGHT GREATER SAPHENOUS VEIN. LIMA TO LAD, SVG TO OM SEQ TO CIRC, SVG TO PD, SVG TO DIAG.;  Surgeon: Army Dallas NOVAK, MD;  Location: [facility] OR;  Service: Open Heart Surgery;  Laterality: N/A;   ENDOVEIN HARVEST OF GREATER SAPHENOUS VEIN Right 08/29/2020   Procedure: ENDOVEIN HARVEST OF GREATER SAPHENOUS VEIN;  Surgeon: Army Dallas NOVAK, MD;  Location: [facility] OR;  Service: Open Heart Surgery;  Laterality: Right;   HERNIA REPAIR     LEFT HEART CATH AND CORONARY ANGIOGRAPHY N/A 08/25/2020   Procedure: LEFT HEART CATH AND CORONARY ANGIOGRAPHY;  Surgeon: Darron Deatrice LABOR, MD;  Location: [facility] INVASIVE CV LAB;  Service: Cardiovascular;  Laterality: N/A;   PACEMAKER IMPLANT N/A 10/12/2021   Procedure: PACEMAKER IMPLANT;  Surgeon: Cindie Ole DASEN, MD;  Location: [facility] INVASIVE CV LAB;  Service: Cardiovascular;  Laterality: N/A;   RIGHT/LEFT HEART CATH AND  CORONARY ANGIOGRAPHY N/A 08/31/2021   Procedure: RIGHT/LEFT HEART CATH AND CORONARY ANGIOGRAPHY;  Surgeon: Darron Deatrice LABOR, MD;  Location: [facility] INVASIVE CV LAB;  Service: Cardiovascular;  Laterality: N/A;   RIGHT/LEFT HEART CATH AND CORONARY/GRAFT ANGIOGRAPHY N/A 06/06/2023   Procedure: RIGHT/LEFT HEART CATH AND CORONARY/GRAFT ANGIOGRAPHY;  Surgeon: Darron Deatrice LABOR, MD;  Location: [facility] INVASIVE CV LAB;  Service: Cardiovascular;  Laterality: N/A;   TEE WITHOUT CARDIOVERSION N/A 08/29/2020   Procedure: TRANSESOPHAGEAL ECHOCARDIOGRAM (TEE);  Surgeon: Army Dallas NOVAK, MD;  Location: [facility] OR;  Service: Open Heart Surgery;  Laterality: N/A;    Allergies  Allergen Reactions   Angiotensin Receptor Blockers     hyperkalemia   Metformin Diarrhea    Family History  Problem Relation Age of Onset   Heart attack Mother     Social History   Tobacco Use   Smoking status: Former    Current packs/day: 0.00    Types: Cigarettes    Quit date: 07/26/1996    Years since quitting: 28.1   Smokeless tobacco: Never  Vaping Use   Vaping status: Never Used  Substance Use Topics   Alcohol use: Not Currently   Drug use: Never     Pertinent GI related history and allergies were reviewed with the patient  Review of Systems  Constitutional:  Negative for activity change, appetite change, chills, diaphoresis, fatigue, fever and unexpected weight change.  HENT:  Negative for trouble swallowing and voice change.   Respiratory:  Negative for shortness of breath and wheezing.   Cardiovascular:  Positive for chest pain and leg swelling. Negative for palpitations.  Gastrointestinal:  Positive for abdominal pain and anal bleeding. Negative for abdominal distention, blood in stool, constipation, diarrhea, nausea and vomiting.  Musculoskeletal:  Negative for arthralgias and myalgias.  Skin:  Negative for color change and pallor.  Neurological:  Negative for dizziness, syncope and weakness.   Psychiatric/Behavioral:  Negative for confusion. The patient is not nervous/anxious.   All other systems reviewed and are negative.    Medications Home Medications No current facility-administered medications on file prior to encounter.   Current Outpatient Medications on File Prior to Encounter  Medication Sig Dispense Refill   acetaminophen  (TYLENOL ) 500 MG tablet Take 1,000 mg by mouth every 6 (six) hours as needed for mild pain, fever or headache.     acyclovir  (ZOVIRAX ) 800 MG tablet Take 800 mg by mouth daily.     apixaban  (ELIQUIS ) 2.5 MG TABS tablet TAKE 1 TABLET BY MOUTH TWICE A DAY 60 tablet 0   carvedilol  (COREG ) 6.25 MG tablet TAKE 1 TABLET BY MOUTH TWICE A DAY WITH FOOD 180 tablet 1   dapagliflozin  propanediol (FARXIGA ) 10 MG TABS tablet Take 1 tablet (10 mg total) by mouth daily. 30 tablet 2   ezetimibe  (ZETIA ) 10 MG tablet TAKE 1 TABLET BY MOUTH EVERY DAY 90 tablet 1   Insulin  Glargine (BASAGLAR  KWIKPEN) 100 UNIT/ML Inject 15 Units into the skin at bedtime.  magnesium  oxide (MAG-OX) 400 MG tablet Take 1 tablet (400 mg total) by mouth daily. 90 tablet 1   metolazone  (ZAROXOLYN ) 2.5 MG tablet Take 2.5 mg by mouth 2 (two) times a week. Every Tuesday and Friday     pantoprazole  (PROTONIX ) 40 MG tablet Take 40 mg by mouth daily.     polyethylene glycol (MIRALAX  / GLYCOLAX ) 17 g packet Take 17 g by mouth daily as needed for moderate constipation. 30 each 0   potassium chloride  SA (KLOR-CON  M) 20 MEQ tablet Take 2 tablets (40 mEq total) by mouth daily. 30 tablet 5   rosuvastatin  (CRESTOR ) 40 MG tablet Take 1 tablet (40 mg total) by mouth daily. 90 tablet 3   sacubitril -valsartan  (ENTRESTO ) 24-26 MG Take 1 tablet by mouth 2 (two) times daily. 60 tablet 5   spironolactone  (ALDACTONE ) 25 MG tablet Take 1 tablet (25 mg total) by mouth daily. 30 tablet 5   tamsulosin  (FLOMAX ) 0.4 MG CAPS capsule Take 0.4 mg by mouth daily.     torsemide  (DEMADEX ) 20 MG tablet Take 40-60 mg by mouth  daily. Alternating days 40 mg and 60 mg     Pertinent GI related medications were reviewed with the patient  Inpatient Medications  Current Facility-Administered Medications:    0.9 %  sodium chloride  infusion, , Intravenous, Continuous, Ward, Kristen N, DO, Last Rate: 75 mL/hr at 09/16/24 0142, New Bag at 09/16/24 0142   acetaminophen  (TYLENOL ) tablet 650 mg, 650 mg, Oral, Q6H PRN **OR** acetaminophen  (TYLENOL ) suppository 650 mg, 650 mg, Rectal, Q6H PRN, Cleatus Delayne GAILS, MD   carvedilol  (COREG ) tablet 6.25 mg, 6.25 mg, Oral, BID WC, Cleatus Delayne V, MD, 6.25 mg at 09/16/24 0932   dapagliflozin  propanediol (FARXIGA ) tablet 10 mg, 10 mg, Oral, Daily, Cleatus Delayne V, MD, 10 mg at 09/16/24 0931   HYDROcodone -acetaminophen  (NORCO/VICODIN) 5-325 MG per tablet 1-2 tablet, 1-2 tablet, Oral, Q4H PRN, Cleatus Delayne GAILS, MD   insulin  aspart (novoLOG ) injection 0-15 Units, 0-15 Units, Subcutaneous, Q4H, Cleatus Delayne V, MD, 2 Units at 09/16/24 9360   ondansetron  (ZOFRAN ) tablet 4 mg, 4 mg, Oral, Q6H PRN **OR** ondansetron  (ZOFRAN ) injection 4 mg, 4 mg, Intravenous, Q6H PRN, Cleatus Delayne GAILS, MD   rosuvastatin  (CRESTOR ) tablet 40 mg, 40 mg, Oral, Daily, Duncan, Hazel V, MD, 40 mg at 09/16/24 0932  sodium chloride  75 mL/hr at 09/16/24 0142    acetaminophen  **OR** acetaminophen , HYDROcodone -acetaminophen , ondansetron  **OR** ondansetron  (ZOFRAN ) IV   Objective   Vitals:   09/16/24 0236 09/16/24 0440 09/16/24 0849 09/16/24 0854  BP: (!) 137/100 137/84 138/81 (!) 160/53  Pulse: 74  67 70  Resp: 18 18 19 20   Temp: 98.5 F (36.9 C) 97.6 F (36.4 C) 97.7 F (36.5 C) 98.2 F (36.8 C)  TempSrc: Oral Axillary Oral Oral  SpO2: 100% 99% 99% 92%  Weight:      Height:         Physical Exam Vitals and nursing note reviewed.  Constitutional:      General: He is not in acute distress.    Appearance: He is ill-appearing (chronically). He is not toxic-appearing or diaphoretic.  HENT:     Head:  Normocephalic and atraumatic.     Nose: Nose normal.     Mouth/Throat:     Mouth: Mucous membranes are moist.     Pharynx: Oropharynx is clear.  Eyes:     General: No scleral icterus.    Extraocular Movements: Extraocular movements intact.  Cardiovascular:  Rate and Rhythm: Normal rate and regular rhythm.  Pulmonary:     Effort: Pulmonary effort is normal. No respiratory distress.     Breath sounds: Normal breath sounds. No wheezing, rhonchi or rales.  Abdominal:     General: Bowel sounds are normal. There is no distension.     Palpations: Abdomen is soft.     Tenderness: There is no abdominal tenderness. There is no guarding or rebound.  Musculoskeletal:     Cervical back: Neck supple.     Right lower leg: Edema present.     Left lower leg: Edema present.  Skin:    General: Skin is warm and dry.     Coloration: Skin is not jaundiced or pale.  Neurological:     General: No focal deficit present.     Mental Status: He is alert and oriented to person, place, and time. Mental status is at baseline.  Psychiatric:        Mood and Affect: Mood normal.        Behavior: Behavior normal.        Thought Content: Thought content normal.        Judgment: Judgment normal.     Laboratory Data Recent Labs  Lab 09/15/24 2053 09/16/24 0419 09/16/24 0954  WBC 3.7* 4.0 3.7*  HGB 13.4 13.4 12.7*  HCT 43.1 42.5 40.9  PLT 135* 138* 133*   Recent Labs  Lab 09/15/24 2053  NA 144  K 3.8  CL 104  CO2 26  BUN 67*  CALCIUM  7.7*  PROT 7.4  BILITOT 0.8  ALKPHOS 70  ALT 12  AST 20  GLUCOSE 103*   Recent Labs  Lab 09/16/24 0419  INR 1.2    No results for input(s): LIPASE in the last 72 hours.      Imaging Studies: No results found.  Assessment:   Reported GI bleeding - none seen in hospital and hgb at baseline - VSS  # Left sided chest discomfort  #HFrEF 25-30% as of October; DCM #AFIB on eliquis  (last took Saturday evening #Complete heart block s/p ppm #Cad  s/p cabg  #CKD  #dm2  # prostate cancer s/p radiation - ddx includes radiation proctitis  Plan:  At this juncture, considering his significant cardiopulmonary disease, hemoglobin and bun/cr at baseline and unchanged despite reported bleeding - would recommend conservative management. Would need to allow for 48 hour washout of eliquis  prior to any endscopic procedure (which wouldn't be until Monday evening) Continue Protonix  40 mg iv q12 h  If chest pain persists, recommend evaluation by primary team or cardiology  Clear liquids now  Hold dvt ppx. Holding eliquis  Monitor H&H.  Transfusion and resuscitation as per primary team Avoid frequent lab draws to prevent lab induced anemia Supportive care and antiemetics as per primary team Maintain two sites IV access Avoid nsaids Monitor for GIB.  Management of other medical comorbidities as per primary team  I personally performed the service.  Thank you for allowing us  to participate in this patient's care. Please don't hesitate to call if any questions or concerns arise.   Elspeth Ozell Jungling, DO [facility]  Portions of the record may have been created with voice recognition software. Occasional wrong-word or 'sound-a-like' substitutions may have occurred due to the inherent limitations of voice recognition software.  Read the chart carefully and recognize, using context, where substitutions may have occurred.

## 2024-09-16 NOTE — Assessment & Plan Note (Addendum)
 Melena and hematochezia Patient reports maroon-colored stools subsequently melena Patient is on Eliquis .  Denies alcohol or NSAID use Hemodynamically stable with hemoglobin remained above 13, GI would like to wait for 48 hours after the last dose of Eliquis  which was on 11/8 PM before deciding about EGD No further reported BM or melena Started on clear liquid diet Continue holding Eliquis  Continue with IV Protonix  Monitor hemoglobin

## 2024-09-16 NOTE — Assessment & Plan Note (Addendum)
 Cautiously continue carvedilol  in the setting of GI bleed Hold apixaban  Continue rosuvastatin  Checking troponin as he was having some nonspecific left-sided chest pain.

## 2024-09-16 NOTE — Assessment & Plan Note (Signed)
 Creatinine at baseline around 2.58

## 2024-09-16 NOTE — ED Provider Notes (Signed)
 [facility] Provider Note    Event Date/Time   First MD Initiated Contact with Patient 09/15/24 2351     (approximate)   History   Rectal Bleeding   HPI  [name] is a 89 y.o. male with history of CAD, CHF, complete heart block status post pacemaker, atrial fibrillation on Eliquis , hypertension, diabetes, hyperlipidemia, chronic kidney disease who presents to the emergency department with maroon-colored stools and now black tarry stools.  No abdominal pain.  No vomiting.  Does not drink alcohol.  Denies prior history of GI bleed.  Has had previous normal screening colonoscopy per his report.  States he chronically has some diarrhea and incontinence of stool.  This is not new for him.  No fever.   History provided by patient and wife.    Past Medical History:  Diagnosis Date   AV block, [place] 1    CAD (coronary artery disease)    a. 08/2019 VF arrest-->sev 3vd on cath-->CABGx5 (LIMA->LAD, VG->OM->LCX, VG->RPDA, VG->Diag); b. 08/2021 Cath: sev native multivessel dzs w/ 5/5 patent grafts. Nl filling pressures->Med rx; c. 05/2023 Cath: stable anatomy->Med rx.   CKD (chronic kidney disease), stage III (HCC)    Diabetes mellitus without complication (HCC)    HFrEF (heart failure with reduced ejection fraction) (HCC)    a. 08/2020 Echo: EF 40-45%; b. 11/2020 Echo: EF 55%, Gr2 DD; c. 08/2021 Echo: EF 35-40%, glob HK w/ ant/antsept/apical HK; d. 10/2021 s/p CRT-P; e. 12/2021 Echo: EF 30-35%, glob HK, GrIII DD; f. 12/2022 Echo: EF 30-35%, glob HK, sev HK of ant wall, mod asymm LVH, nl RV fxn, mildly dil LA, triv MR, AoV sclerosis.   Hyperlipidemia LDL goal <70    Hypertension    Intermittent Complete heart block (HCC)    a. 08/2021 noted on Zio; b. 10/2021 s/p Abbott Allure RF CRT-P (Ser # [id number]).   Ischemic cardiomyopathy    a. 08/2020 Echo: EF 40-45%; b. 11/2020 Echo: EF 55%; c. 08/2021 Echo: EF 35-40%; d. 10/2021 s/p CRT-P; e. 12/2021 Echo: EF 30-35%, glob HK,  GrIII DD.   PAF (paroxysmal atrial fibrillation) (HCC)    a.Medical device check-->Eliquis  (CHA2DS2VASc = 6)   Trifascicular block     Past Surgical History:  Procedure Laterality Date   BACK SURGERY     CARDIAC CATHETERIZATION     CLIPPING OF ATRIAL APPENDAGE N/A 08/29/2020   Procedure: CLIPPING OF ATRIAL APPENDAGE USING ATRICURE 45 MM ATRICLIP FLEX-V;  Surgeon: Army Dallas NOVAK, MD;  Location: [facility] OR;  Service: Open Heart Surgery;  Laterality: N/A;   CORONARY ARTERY BYPASS GRAFT N/A 08/29/2020   Procedure: CORONARY ARTERY BYPASS GRAFTING (CABG) X 5 USING LEFT INTERNAL MAMMARY ARTERY AND ENDOSCOPICALLY HARVESTED RIGHT GREATER SAPHENOUS VEIN. LIMA TO LAD, SVG TO OM SEQ TO CIRC, SVG TO PD, SVG TO DIAG.;  Surgeon: Army Dallas NOVAK, MD;  Location: [facility] OR;  Service: Open Heart Surgery;  Laterality: N/A;   ENDOVEIN HARVEST OF GREATER SAPHENOUS VEIN Right 08/29/2020   Procedure: ENDOVEIN HARVEST OF GREATER SAPHENOUS VEIN;  Surgeon: Army Dallas NOVAK, MD;  Location: [facility] OR;  Service: Open Heart Surgery;  Laterality: Right;   HERNIA REPAIR     LEFT HEART CATH AND CORONARY ANGIOGRAPHY N/A 08/25/2020   Procedure: LEFT HEART CATH AND CORONARY ANGIOGRAPHY;  Surgeon: Darron Deatrice LABOR, MD;  Location: [facility] INVASIVE CV LAB;  Service: Cardiovascular;  Laterality: N/A;   PACEMAKER IMPLANT N/A 10/12/2021   Procedure: PACEMAKER IMPLANT;  Surgeon: Cindie Ole DASEN, MD;  Location: [facility] INVASIVE CV LAB;  Service: Cardiovascular;  Laterality: N/A;   RIGHT/LEFT HEART CATH AND CORONARY ANGIOGRAPHY N/A 08/31/2021   Procedure: RIGHT/LEFT HEART CATH AND CORONARY ANGIOGRAPHY;  Surgeon: Darron Deatrice LABOR, MD;  Location: [facility] INVASIVE CV LAB;  Service: Cardiovascular;  Laterality: N/A;   RIGHT/LEFT HEART CATH AND CORONARY/GRAFT ANGIOGRAPHY N/A 06/06/2023   Procedure: RIGHT/LEFT HEART CATH AND CORONARY/GRAFT ANGIOGRAPHY;  Surgeon: Darron Deatrice LABOR, MD;  Location: [facility] INVASIVE CV LAB;  Service: Cardiovascular;  Laterality:  N/A;   TEE WITHOUT CARDIOVERSION N/A 08/29/2020   Procedure: TRANSESOPHAGEAL ECHOCARDIOGRAM (TEE);  Surgeon: Army Dallas NOVAK, MD;  Location: [facility] OR;  Service: Open Heart Surgery;  Laterality: N/A;    MEDICATIONS:  Prior to Admission medications   Medication Sig Start Date End Date Taking? Authorizing Provider  acetaminophen  (TYLENOL ) 500 MG tablet Take 1,000 mg by mouth every 6 (six) hours as needed for mild pain, fever or headache.    [provider]  acyclovir  (ZOVIRAX ) 800 MG tablet Take 800 mg by mouth daily.    [provider]  apixaban  (ELIQUIS ) 2.5 MG TABS tablet TAKE 1 TABLET BY MOUTH TWICE A DAY 07/24/24   Terra Fairy PARAS, PA-C  carvedilol  (COREG ) 6.25 MG tablet TAKE 1 TABLET BY MOUTH TWICE A DAY WITH FOOD 07/24/24   Zenaida Morene PARAS, MD  dapagliflozin  propanediol (FARXIGA ) 10 MG TABS tablet Take 1 tablet (10 mg total) by mouth daily. 12/27/23   Awanda City, MD  ezetimibe  (ZETIA ) 10 MG tablet TAKE 1 TABLET BY MOUTH EVERY DAY 06/29/24   McLean, Dalton S, MD  Insulin  Glargine (BASAGLAR  [facility]) 100 UNIT/ML Inject 15 Units into the skin at bedtime. 02/19/24 02/18/25  [provider]  magnesium  oxide (MAG-OX) 400 MG tablet Take 1 tablet (400 mg total) by mouth daily. 01/20/22   Cindie Ole DASEN, MD  metolazone  (ZAROXOLYN ) 2.5 MG tablet Take 2.5 mg by mouth 2 (two) times a week. Every Tuesday and Friday    [provider]  pantoprazole  (PROTONIX ) 40 MG tablet Take 40 mg by mouth daily.    [provider]  polyethylene glycol (MIRALAX  / GLYCOLAX ) 17 g packet Take 17 g by mouth daily as needed for moderate constipation. 12/19/22   Josette Ade, MD  potassium chloride  SA (KLOR-CON  M) 20 MEQ tablet Take 2 tablets (40 mEq total) by mouth daily. 04/20/24   Donette City LABOR, FNP  rosuvastatin  (CRESTOR ) 40 MG tablet Take 1 tablet (40 mg total) by mouth daily. 05/25/23   Gerard Frederick, NP  sacubitril -valsartan  (ENTRESTO ) 24-26 MG Take 1 tablet by mouth 2  (two) times daily. 09/04/24   Donette City LABOR, FNP  spironolactone  (ALDACTONE ) 25 MG tablet Take 1 tablet (25 mg total) by mouth daily. 04/19/24 08/30/24  Donette City LABOR, FNP  tamsulosin  (FLOMAX ) 0.4 MG CAPS capsule Take 0.4 mg by mouth daily.    [provider]  torsemide  (DEMADEX ) 20 MG tablet Take 40-60 mg by mouth daily. Alternating days 40 mg and 60 mg 07/11/24   [provider]    Physical Exam   Triage Vital Signs: ED Triage Vitals  Encounter Vitals Group     BP 09/15/24 2051 127/78     Girls Systolic BP Percentile --      Girls Diastolic BP Percentile --      Boys Systolic BP Percentile --      Boys Diastolic BP Percentile --      Pulse Rate 09/15/24 2051 67     Resp 09/15/24  2051 16     Temp 09/15/24 2051 97.8 F (36.6 C)     Temp Source 09/15/24 2051 Oral     SpO2 09/15/24 2051 99 %     Weight 09/15/24 2046 206 lb (93.4 kg)     Height 09/15/24 2046 5' 8 (1.727 m)     Head Circumference --      Peak Flow --      Pain Score 09/15/24 2045 6     Pain Loc --      Pain Education --      Exclude from Growth Chart --     Most recent vital signs: Vitals:   09/15/24 2051 09/16/24 0053  BP: 127/78 (!) 144/98  Pulse: 67 (!) 58  Resp: 16 20  Temp: 97.8 F (36.6 C) 97.7 F (36.5 C)  SpO2: 99% 98%    CONSTITUTIONAL: Alert, responds appropriately to questions.  Elderly HEAD: Normocephalic, atraumatic EYES: Conjunctivae clear, pupils appear equal, sclera nonicteric ENT: normal nose; moist mucous membranes NECK: Supple, normal ROM CARD: RRR; S1 and S2 appreciated RESP: Normal chest excursion without splinting or tachypnea; breath sounds clear and equal bilaterally; no wheezes, no rhonchi, no rales, no hypoxia or respiratory distress, speaking full sentences ABD/GI: Non-distended; soft, non-tender, no rebound, no guarding, no peritoneal signs RECTAL:  Normal rectal tone, no gross blood.  Patient does have significant amount of melena that is strongly guaiac  positive.  No hemorrhoids appreciated, nontender rectal exam, no fecal impaction. Chaperone present. BACK: The back appears normal EXT: Normal ROM in all joints; no deformity noted, symmetric edema in bilateral lower extremities SKIN: Normal color for age and race; warm; no rash on exposed skin NEURO: Moves all extremities equally, normal speech PSYCH: The patient's mood and manner are appropriate.   ED Results / Procedures / Treatments   LABS: (all labs ordered are listed, but only abnormal results are displayed) Labs Reviewed  COMPREHENSIVE METABOLIC PANEL WITH GFR - Abnormal; Notable for the following components:      Result Value   Glucose, Bld 103 (*)    BUN 67 (*)    Creatinine, Ser 2.58 (*)    Calcium  7.7 (*)    Albumin  3.4 (*)    GFR, Estimated 23 (*)    All other components within normal limits  CBC - Abnormal; Notable for the following components:   WBC 3.7 (*)    RDW 15.9 (*)    Platelets 135 (*)    All other components within normal limits  PROTIME-INR  POC OCCULT BLOOD, ED  TYPE AND SCREEN     EKG:  EKG Interpretation Date/Time:    Ventricular Rate:    PR Interval:    QRS Duration:    QT Interval:    QTC Calculation:   R Axis:      Text Interpretation:           RADIOLOGY: My personal review and interpretation of imaging:    I have personally reviewed all radiology reports.   No results found.   PROCEDURES:  Critical Care performed: Yes, see critical care procedure note(s)   CRITICAL CARE Performed by: Josette Sink   Total critical care time: 30 minutes  Critical care time was exclusive of separately billable procedures and treating other patients.  Critical care was necessary to treat or prevent imminent or life-threatening deterioration.  Critical care was time spent personally by me on the following activities: development of treatment plan with patient and/or surrogate as well as  nursing, discussions with consultants, evaluation  of patient's response to treatment, examination of patient, obtaining history from patient or surrogate, ordering and performing treatments and interventions, ordering and review of laboratory studies, ordering and review of radiographic studies, pulse oximetry and re-evaluation of patient's condition.   SABRA1-3 Lead EKG Interpretation  Performed by: Venkat Ankney, Josette SAILOR, DO Authorized by: Caleigh Rabelo, Josette SAILOR, DO     Interpretation: normal     ECG rate:  58   ECG rate assessment: normal     Rhythm: sinus rhythm     Ectopy: none     Conduction: normal       IMPRESSION / MDM / ASSESSMENT AND PLAN / ED COURSE  I reviewed the triage vital signs and the nursing notes.    Patient here with upper GI bleed, melena on Eliquis .  The patient is on the cardiac monitor to evaluate for evidence of arrhythmia and/or significant heart rate changes.   DIFFERENTIAL DIAGNOSIS (includes but not limited to):   Upper GI bleed, gastritis, ulcer, anemia, diverticulosis, doubt esophageal varices   Patient's presentation is most consistent with acute presentation with potential threat to life or bodily function.   PLAN: Patient's hemoglobin is 13.4.  Mild chronic thrombocytopenia with platelet count of 135,000.  INR pending.  Patient is on Eliquis .  He is hemodynamically stable with normal hemoglobin at this time so we will hold on reversing his Eliquis  and closely monitor.  He is on cardiac monitoring.  Creatinine elevated but this is also baseline for him.  Given concerns for upper GI bleed with melena while on anticoagulant, will admit to the hospitalist service.  Will start Protonix .  He denies any history of alcohol use, esophageal varices.  No indication to start octreotide.  Abdominal exam benign.   MEDICATIONS GIVEN IN ED: Medications  0.9 %  sodium chloride  infusion ( Intravenous New Bag/Given 09/16/24 0142)  pantoprazole  (PROTONIX ) injection 80 mg (80 mg Intravenous Given 09/16/24 0143)     ED COURSE:  Patient continues to be hemodynamically stable.   CONSULTS:  Consulted and discussed patient's case with hospitalist, Dr. Cleatus.  I have recommended admission and consulting physician agrees and will place admission orders.  Patient (and family if present) agree with this plan.   I reviewed all nursing notes, vitals, pertinent previous records.  All labs, EKGs, imaging ordered have been independently reviewed and interpreted by myself.    OUTSIDE RECORDS REVIEWED: Reviewed recent cardiology notes.       FINAL CLINICAL IMPRESSION(S) / ED DIAGNOSES   Final diagnoses:  Upper GI bleed     Rx / DC Orders   ED Discharge Orders     None        Note:  This document was prepared using Dragon voice recognition software and may include unintentional dictation errors.   [name], Josette SAILOR, DO 09/16/24 [phone]

## 2024-09-16 NOTE — Care Management Obs Status (Signed)
 MEDICARE OBSERVATION STATUS NOTIFICATION   Patient Details  Name: [name] MRN: [id number] Date of Birth: Jun 26, 1935   Medicare Observation Status Notification Given:  Yes    [name] 09/16/2024, 5:34 PM

## 2024-09-16 NOTE — Hospital Course (Addendum)
 Partly taken from H&P.  [name] is a 89 y.o. male with medical history significant for PAF on Eliquis , CAD s/p CABG x5 and multiple stents, HFrEF secondary to ischemic cardiomyopathy (EF 25-30% 12/2023), heart block s/p pacemaker, CKD stage IV, being admitted with GI bleed. Patient started having maroon-colored stool on the day of arrival which eventually became black.   Denies any NSAID use.  Has a previously normal colonoscopy.  On presentation vital stable, hemoglobin of 13.4, platelet 135, creatinine 2.58 which is around his baseline.   Eliquis  was held and patient was started on IV Protonix .  GI was consulted.  Hemoglobin remained stable.  11/9: Vital stable with mildly elevated blood pressure at 160/53, epigastric and left upper quadrant pain going to the left side of the chest, improved with burping-likely GI in origin shared but will check troponin to rule out cardiac cause.  Hemoglobin stable with no BM since in the hospital.  GI would like to wait for at least 48 hours before deciding about EGD.  Last Eliquis  dose was on 11/8 PM, currently being held.  11/10: Remained hemodynamically stable with stable hemoglobin.  Patient did not had any more melena.  GI does not think that EGD needed at this time and they were recommending holding Eliquis  for another 2 to 3 days and then he can resume it.  If he experience any more abnormal bleeding then need to follow-up with gastroenterology.  Patient was instructed to hold Eliquis  until 09/21/2024 and then resume it.  Patient also has CKD stage IV and was taking potassium supplement, potassium low normal, we decreased the home dose of potassium to 20 mEq and he was instructed to follow-up closely with his nephrologist for further assistance.  He will continue the rest of his medications.  We also increase his Protonix  from once daily to twice daily.   Patient was thinking that he need oxygen he was feeling intermittent shortness of breath.  He  was able to maintain his saturation above 90% on room air even with ambulation so did not qualify for oxygen.  He will get benefit from outpatient sleep study for further evaluation.  Patient will continue on current medications and need to have a close follow-up with his providers for further assistance.

## 2024-09-16 NOTE — Assessment & Plan Note (Signed)
 No acute issues suspected

## 2024-09-16 NOTE — Assessment & Plan Note (Addendum)
 Dilated cardiomyopathy, EF 25 to 30% 12/2023 Clinically euvolemic In the setting of acute GI bleed, will hold spironolactone , torsemide , Entresto  and metolazone  Continue carvedilol  with close monitoring of blood pressure Daily weights  Monitor for decompensation in view of hydration

## 2024-09-17 DIAGNOSIS — I480 Paroxysmal atrial fibrillation: Secondary | ICD-10-CM | POA: Diagnosis not present

## 2024-09-17 DIAGNOSIS — K922 Gastrointestinal hemorrhage, unspecified: Secondary | ICD-10-CM | POA: Diagnosis not present

## 2024-09-17 DIAGNOSIS — Z951 Presence of aortocoronary bypass graft: Secondary | ICD-10-CM

## 2024-09-17 DIAGNOSIS — I2510 Atherosclerotic heart disease of native coronary artery without angina pectoris: Secondary | ICD-10-CM

## 2024-09-17 DIAGNOSIS — I5020 Unspecified systolic (congestive) heart failure: Secondary | ICD-10-CM | POA: Diagnosis not present

## 2024-09-17 LAB — BASIC METABOLIC PANEL WITH GFR
Anion gap: 9 (ref 5–15)
BUN: 62 mg/dL — ABNORMAL HIGH (ref 8–23)
CO2: 23 mmol/L (ref 22–32)
Calcium: 7.5 mg/dL — ABNORMAL LOW (ref 8.9–10.3)
Chloride: 106 mmol/L (ref 98–111)
Creatinine, Ser: 2.63 mg/dL — ABNORMAL HIGH (ref 0.61–1.24)
GFR, Estimated: 23 mL/min — ABNORMAL LOW (ref >60)
Glucose, Bld: 130 mg/dL — ABNORMAL HIGH (ref 70–99)
Potassium: 3.7 mmol/L (ref 3.5–5.1)
Sodium: 138 mmol/L (ref 135–145)

## 2024-09-17 LAB — CBC
HCT: 42.8 % (ref 39.0–52.0)
Hemoglobin: 13.4 g/dL (ref 13.0–17.0)
MCH: 27.8 pg (ref 26.0–34.0)
MCHC: 31.3 g/dL (ref 30.0–36.0)
MCV: 88.8 fL (ref 80.0–100.0)
Platelets: 141 K/uL — ABNORMAL LOW (ref 150–400)
RBC: 4.82 MIL/uL (ref 4.22–5.81)
RDW: 15.8 % — ABNORMAL HIGH (ref 11.5–15.5)
WBC: 5.4 K/uL (ref 4.0–10.5)
nRBC: 0 % (ref 0.0–0.2)

## 2024-09-17 LAB — GLUCOSE, CAPILLARY
Glucose-Capillary: 103 mg/dL — ABNORMAL HIGH (ref 70–99)
Glucose-Capillary: 105 mg/dL — ABNORMAL HIGH (ref 70–99)
Glucose-Capillary: 143 mg/dL — ABNORMAL HIGH (ref 70–99)
Glucose-Capillary: 98 mg/dL (ref 70–99)

## 2024-09-17 MED ORDER — POTASSIUM CHLORIDE CRYS ER 20 MEQ PO TBCR: 20.0000 meq | EXTENDED_RELEASE_TABLET | Freq: Every day | ORAL | 5 refills | Status: DC

## 2024-09-17 MED ORDER — PANTOPRAZOLE SODIUM 40 MG PO TBEC: 40.0000 mg | DELAYED_RELEASE_TABLET | Freq: Two times a day (BID) | ORAL | 1 refills | Status: AC

## 2024-09-17 NOTE — Discharge Summary (Signed)
 Physician Discharge Summary   Patient: [name] MRN: [id number] DOB: 07-Nov-1935  Admit date:     09/15/2024  Discharge date: 09/17/24  Discharge Physician: Amaryllis Dare   PCP: [name], MD   Recommendations at discharge:  Please obtain CBC and BMP on follow-up Please monitor potassium closely as patient has CKD stage IV, on spironolactone  and potassium supplement. Follow-up with nephrology Follow-up with primary care provider Follow-up with cardiology Follow-up with gastroenterology if recurrence of GI bleed Patient will get benefit from outpatient sleep study  Discharge Diagnoses: Principal Problem:   Upper GI bleed Active Problems:   Hematochezia   HFrEF (heart failure with reduced ejection fraction) (HCC)   CAD, s/p CABG x 5   Type II diabetes mellitus with renal manifestations (HCC)   Paroxysmal atrial fibrillation (HCC)   S/P CABG x 5   Complete heart block s/p pacemaker (HCC)   Dilated cardiomyopathy (HCC)   CKD (chronic kidney disease), stage IV [facility])   Hospital Course:  [name] is a 89 y.o. male with medical history significant for PAF on Eliquis , CAD s/p CABG x5 and multiple stents, HFrEF secondary to ischemic cardiomyopathy (EF 25-30% 12/2023), heart block s/p pacemaker, CKD stage IV, being admitted with GI bleed. Patient started having maroon-colored stool on the day of arrival which eventually became black.   Denies any NSAID use.  Has a previously normal colonoscopy.  On presentation vital stable, hemoglobin of 13.4, platelet 135, creatinine 2.58 which is around his baseline.   Eliquis  was held and patient was started on IV Protonix .  GI was consulted.  Hemoglobin remained stable.  11/9: Vital stable with mildly elevated blood pressure at 160/53, epigastric and left upper quadrant pain going to the left side of the chest, improved with burping-likely GI in origin shared but will check troponin to rule out cardiac cause.  Hemoglobin stable with  no BM since in the hospital.  GI would like to wait for at least 48 hours before deciding about EGD.  Last Eliquis  dose was on 11/8 PM, currently being held.  11/10: Remained hemodynamically stable with stable hemoglobin.  Patient did not had any more melena.  GI does not think that EGD needed at this time and they were recommending holding Eliquis  for another 2 to 3 days and then he can resume it.  If he experience any more abnormal bleeding then need to follow-up with gastroenterology.  Patient was instructed to hold Eliquis  until 09/21/2024 and then resume it.  Patient also has CKD stage IV and was taking potassium supplement, potassium low normal, we decreased the home dose of potassium to 20 mEq and he was instructed to follow-up closely with his nephrologist for further assistance.  He will continue the rest of his medications.  We also increase his Protonix  from once daily to twice daily.   Patient was thinking that he need oxygen he was feeling intermittent shortness of breath.  He was able to maintain his saturation above 90% on room air even with ambulation so did not qualify for oxygen.  He will get benefit from outpatient sleep study for further evaluation.  Patient will continue on current medications and need to have a close follow-up with his providers for further assistance.   Assessment and Plan: * Upper GI bleed Melena and hematochezia Patient reports maroon-colored stools subsequently melena Patient is on Eliquis .  Denies alcohol or NSAID use Hemodynamically stable with hemoglobin remained above 13, GI was consulted but they does not think that  EGD is necessary at this point based on significant underlying comorbidities.  They advised to hold Eliquis  for 2-3 more days and then resume it.  Patient did not had any more bleeding since in the hospital.  HFrEF (heart failure with reduced ejection fraction) (HCC) Dilated cardiomyopathy, EF 25 to 30% 12/2023 Clinically  euvolemic Will continue home medications  CAD, s/p CABG x 5 Cautiously continue carvedilol  in the setting of GI bleed Hold apixaban  Continue rosuvastatin   Paroxysmal atrial fibrillation (HCC) Hold Eliquis  in the setting of acute GI bleed-he will resume on 11/14.  Continue home Coreg   Type II diabetes mellitus with renal manifestations (HCC) Sliding scale insulin  coverage  CKD (chronic kidney disease), stage IV (HCC) Creatinine at baseline around 2.58  Complete heart block s/p pacemaker (HCC) No acute issues suspected  Consultants: Gastroenterology Procedures performed: None Disposition: Home Diet recommendation:  Discharge Diet Orders (From admission, onward)     Start     Ordered   09/17/24 0000  Diet - low sodium heart healthy        09/17/24 1408           Cardiac diet DISCHARGE MEDICATION: Allergies as of 09/17/2024       Reactions   Angiotensin Receptor Blockers    hyperkalemia   Metformin Diarrhea        Medication List     PAUSE taking these medications    Eliquis  2.5 MG Tabs tablet Wait to take this until: September 21, 2024 Generic drug: apixaban  TAKE 1 TABLET BY MOUTH TWICE A DAY       TAKE these medications    acetaminophen  500 MG tablet Commonly known as: TYLENOL  Take 1,000 mg by mouth every 6 (six) hours as needed for mild pain, fever or headache.   acyclovir  800 MG tablet Commonly known as: ZOVIRAX  Take 800 mg by mouth daily.   Basaglar  KwikPen 100 UNIT/ML Inject 15 Units into the skin at bedtime.   carvedilol  6.25 MG tablet Commonly known as: COREG  TAKE 1 TABLET BY MOUTH TWICE A DAY WITH FOOD   dapagliflozin  propanediol 10 MG Tabs tablet Commonly known as: Farxiga  Take 1 tablet (10 mg total) by mouth daily.   ezetimibe  10 MG tablet Commonly known as: ZETIA  TAKE 1 TABLET BY MOUTH EVERY DAY   magnesium  oxide 400 MG tablet Commonly known as: MAG-OX Take 1 tablet (400 mg total) by mouth daily.   metolazone  2.5 MG  tablet Commonly known as: ZAROXOLYN  Take 2.5 mg by mouth 2 (two) times a week. Every Tuesday and Friday   pantoprazole  40 MG tablet Commonly known as: PROTONIX  Take 1 tablet (40 mg total) by mouth 2 (two) times daily. What changed: when to take this   polyethylene glycol 17 g packet Commonly known as: MIRALAX  / GLYCOLAX  Take 17 g by mouth daily as needed for moderate constipation.   potassium chloride  SA 20 MEQ tablet Commonly known as: KLOR-CON  M Take 1 tablet (20 mEq total) by mouth daily. What changed: how much to take   rosuvastatin  40 MG tablet Commonly known as: CRESTOR  Take 1 tablet (40 mg total) by mouth daily.   sacubitril -valsartan  24-26 MG Commonly known as: Entresto  Take 1 tablet by mouth 2 (two) times daily.   spironolactone  25 MG tablet Commonly known as: ALDACTONE  Take 1 tablet (25 mg total) by mouth daily.   tamsulosin  0.4 MG Caps capsule Commonly known as: FLOMAX  Take 0.4 mg by mouth daily.   torsemide  20 MG tablet Commonly known as: DEMADEX   Take 40-60 mg by mouth daily. Alternating days 40 mg and 60 mg        Follow-up Information     [facility]. Go on 09/25/2024.   Specialty: Cardiology Why: Hospital Follow-Up 09/25/24 @ 9:00 AM Please bring all medications to follow-up appointment [facility], [address] Floor Free Valet Parking at the door Contact information: [address] [place]  [phone]        [name], MD. Schedule an appointment as soon as possible for a visit.   Specialty: Internal Medicine Contact information: [address] [place] [address] [phone]                Discharge Exam: Fredricka Weights   09/15/24 2046  Weight: 93.4 kg   General.  Frail elderly man, in no acute distress. Pulmonary.  Lungs clear bilaterally, normal respiratory effort. CV.  Regular rate and rhythm, no JVD, rub  or murmur. Abdomen.  Soft, nontender, nondistended, BS positive. CNS.  Alert and oriented .  No focal neurologic deficit. Extremities.  No edema, no cyanosis, pulses intact and symmetrical.  Condition at discharge: stable  The results of significant diagnostics from this hospitalization (including imaging, microbiology, ancillary and laboratory) are listed below for reference.   Imaging Studies: No results found.  Microbiology: Results for orders placed or performed during the hospital encounter of 08/15/24  Resp panel by RT-PCR (RSV, Flu A&B, Covid) Anterior Nasal Swab     Status: None   Collection Time: 08/15/24  9:28 PM   Specimen: Anterior Nasal Swab  Result Value Ref Range Status   SARS Coronavirus 2 by RT PCR NEGATIVE NEGATIVE Final    Comment: (NOTE) SARS-CoV-2 target nucleic acids are NOT DETECTED.  The SARS-CoV-2 RNA is generally detectable in upper respiratory specimens during the acute phase of infection. The lowest concentration of SARS-CoV-2 viral copies this assay can detect is 138 copies/mL. A negative result does not preclude SARS-Cov-2 infection and should not be used as the sole basis for treatment or other patient management decisions. A negative result may occur with  improper specimen collection/handling, submission of specimen other than nasopharyngeal swab, presence of viral mutation(s) within the areas targeted by this assay, and inadequate number of viral copies(<138 copies/mL). A negative result must be combined with clinical observations, patient history, and epidemiological information. The expected result is Negative.  Fact Sheet for Patients:  bloggercourse.com  Fact Sheet for Healthcare Providers:  seriousbroker.it  This test is no t yet approved or cleared by the [place]  FDA and  has been authorized for detection and/or diagnosis of SARS-CoV-2 by FDA under an Emergency Use Authorization  (EUA). This EUA will remain  in effect (meaning this test can be used) for the duration of the COVID-19 declaration under Section 564(b)(1) of the Act, 21 U.S.C.section 360bbb-3(b)(1), unless the authorization is terminated  or revoked sooner.       Influenza A by PCR NEGATIVE NEGATIVE Final   Influenza B by PCR NEGATIVE NEGATIVE Final    Comment: (NOTE) The Xpert Xpress SARS-CoV-2/FLU/RSV plus assay is intended as an aid in the diagnosis of influenza from Nasopharyngeal swab specimens and should not be used as a sole basis for treatment. Nasal washings and aspirates are unacceptable for Xpert Xpress SARS-CoV-2/FLU/RSV testing.  Fact Sheet for Patients: bloggercourse.com  Fact Sheet for Healthcare Providers: seriousbroker.it  This test is not yet approved or cleared by the  [place]  FDA and has been authorized for detection and/or diagnosis of SARS-CoV-2 by FDA under an Emergency Use Authorization (EUA). This EUA will remain in effect (meaning this test can be used) for the duration of the COVID-19 declaration under Section 564(b)(1) of the Act, 21 U.S.C. section 360bbb-3(b)(1), unless the authorization is terminated or revoked.     Resp Syncytial Virus by PCR NEGATIVE NEGATIVE Final    Comment: (NOTE) Fact Sheet for Patients: bloggercourse.com  Fact Sheet for Healthcare Providers: seriousbroker.it  This test is not yet approved or cleared by the [place]  FDA and has been authorized for detection and/or diagnosis of SARS-CoV-2 by FDA under an Emergency Use Authorization (EUA). This EUA will remain in effect (meaning this test can be used) for the duration of the COVID-19 declaration under Section 564(b)(1) of the Act, 21 U.S.C. section 360bbb-3(b)(1), unless the authorization is terminated or revoked.  Performed at [facility], [address]  [place], [place] [address]     Labs: CBC: Recent Labs  Lab 09/15/24 2053 09/16/24 0419 09/16/24 0954 09/16/24 1513 09/17/24 0331  WBC 3.7* 4.0 3.7* 3.5* 5.4  HGB 13.4 13.4 12.7* 12.9* 13.4  HCT 43.1 42.5 40.9 41.0 42.8  MCV 90.2 90.2 90.1 88.9 88.8  PLT 135* 138* 133* 126* 141*   Basic Metabolic Panel: Recent Labs  Lab 09/15/24 2053 09/17/24 1257  NA 144 138  K 3.8 3.7  CL 104 106  CO2 26 23  GLUCOSE 103* 130*  BUN 67* 62*  CREATININE 2.58* 2.63*  CALCIUM  7.7* 7.5*   Liver Function Tests: Recent Labs  Lab 09/15/24 2053  AST 20  ALT 12  ALKPHOS 70  BILITOT 0.8  PROT 7.4  ALBUMIN  3.4*   CBG: Recent Labs  Lab 09/16/24 2051 09/17/24 0015 09/17/24 0409 09/17/24 0753 09/17/24 1203  GLUCAP 158* 103* 105* 98 143*    Discharge time spent: greater than 30 minutes.  This record has been created using Conservation officer, historic buildings. Errors have been sought and corrected,but may not always be located. Such creation errors do not reflect on the standard of care.   Signed: Amaryllis Dare, MD [facility] 09/17/2024

## 2024-09-17 NOTE — TOC CM/SW Note (Signed)
.  Transition of Care [facility]) - Inpatient Brief Assessment   Patient Details  Name: Sharone Almond MRN: [id number] Date of Birth: 06-01-1935  Transition of Care [facility]) CM/SW Contact:    [name], RN Phone Number: 09/17/2024, 3:09 PM   Clinical Narrative: RNCM reviewed chart. No TOC needs identified.   Transition of Care Asessment: Insurance and Status: Insurance coverage has been reviewed Patient has primary care physician: Yes Home environment has been reviewed: single family home Prior level of function:: Not assessed Prior/Current Home Services: No current home services Social Drivers of Health Review: SDOH reviewed no interventions necessary Readmission risk has been reviewed: Yes Transition of care needs: no transition of care needs at this time

## 2024-09-17 NOTE — Plan of Care (Signed)

## 2024-09-18 ENCOUNTER — Encounter: Admitting: Cardiology

## 2024-09-24 ENCOUNTER — Telehealth: Payer: Self-pay | Admitting: Family

## 2024-09-24 ENCOUNTER — Ambulatory Visit (INDEPENDENT_AMBULATORY_CARE_PROVIDER_SITE_OTHER): Admitting: Podiatry

## 2024-09-24 ENCOUNTER — Encounter: Payer: Self-pay | Admitting: Podiatry

## 2024-09-24 DIAGNOSIS — E1151 Type 2 diabetes mellitus with diabetic peripheral angiopathy without gangrene: Secondary | ICD-10-CM | POA: Diagnosis not present

## 2024-09-24 DIAGNOSIS — M79674 Pain in right toe(s): Secondary | ICD-10-CM | POA: Diagnosis not present

## 2024-09-24 DIAGNOSIS — B351 Tinea unguium: Secondary | ICD-10-CM

## 2024-09-24 DIAGNOSIS — M79675 Pain in left toe(s): Secondary | ICD-10-CM | POA: Diagnosis not present
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
## 2024-09-24 NOTE — Telephone Encounter (Signed)
 Called to confirm/remind patient of their appointment at the [facility] on 09/25/24.   Appointment:   [] Confirmed  [x] Left mess   [] No answer/No voice mail  [] VM Full/unable to leave message  [] Phone not in service  Patient reminded to bring all medications and/or complete list.  Confirmed patient has transportation. Gave directions, instructed to utilize valet parking.

## 2024-09-25 ENCOUNTER — Encounter: Payer: Self-pay | Admitting: Family

## 2024-09-25 ENCOUNTER — Other Ambulatory Visit
Admission: RE | Admit: 2024-09-25 | Discharge: 2024-09-25 | Disposition: A | Discharge status: Home / Self Care | Source: Ambulatory Visit | Attending: Family | Admitting: Family

## 2024-09-25 ENCOUNTER — Ambulatory Visit: Payer: Self-pay | Admitting: Family

## 2024-09-25 ENCOUNTER — Ambulatory Visit: Attending: Family | Admitting: Family

## 2024-09-25 VITALS — BP 145/78 | HR 76 | Wt 220.0 lb

## 2024-09-25 DIAGNOSIS — I480 Paroxysmal atrial fibrillation: Secondary | ICD-10-CM | POA: Insufficient documentation

## 2024-09-25 DIAGNOSIS — Z794 Long term (current) use of insulin: Secondary | ICD-10-CM | POA: Insufficient documentation

## 2024-09-25 DIAGNOSIS — R197 Diarrhea, unspecified: Secondary | ICD-10-CM | POA: Insufficient documentation

## 2024-09-25 DIAGNOSIS — I130 Hypertensive heart and chronic kidney disease with heart failure and stage 1 through stage 4 chronic kidney disease, or unspecified chronic kidney disease: Secondary | ICD-10-CM | POA: Insufficient documentation

## 2024-09-25 DIAGNOSIS — I2510 Atherosclerotic heart disease of native coronary artery without angina pectoris: Secondary | ICD-10-CM | POA: Insufficient documentation

## 2024-09-25 DIAGNOSIS — K922 Gastrointestinal hemorrhage, unspecified: Secondary | ICD-10-CM

## 2024-09-25 DIAGNOSIS — Z79624 Long term (current) use of inhibitors of nucleotide synthesis: Secondary | ICD-10-CM | POA: Insufficient documentation

## 2024-09-25 DIAGNOSIS — Z87891 Personal history of nicotine dependence: Secondary | ICD-10-CM | POA: Insufficient documentation

## 2024-09-25 DIAGNOSIS — E785 Hyperlipidemia, unspecified: Secondary | ICD-10-CM | POA: Insufficient documentation

## 2024-09-25 DIAGNOSIS — N183 Chronic kidney disease, stage 3 unspecified: Secondary | ICD-10-CM | POA: Diagnosis not present

## 2024-09-25 DIAGNOSIS — Z7901 Long term (current) use of anticoagulants: Secondary | ICD-10-CM | POA: Insufficient documentation

## 2024-09-25 DIAGNOSIS — Z7984 Long term (current) use of oral hypoglycemic drugs: Secondary | ICD-10-CM | POA: Insufficient documentation

## 2024-09-25 DIAGNOSIS — Z87440 Personal history of urinary (tract) infections: Secondary | ICD-10-CM | POA: Insufficient documentation

## 2024-09-25 DIAGNOSIS — Z79899 Other long term (current) drug therapy: Secondary | ICD-10-CM | POA: Insufficient documentation

## 2024-09-25 DIAGNOSIS — I5022 Chronic systolic (congestive) heart failure: Secondary | ICD-10-CM

## 2024-09-25 DIAGNOSIS — I255 Ischemic cardiomyopathy: Secondary | ICD-10-CM | POA: Insufficient documentation

## 2024-09-25 DIAGNOSIS — R5383 Other fatigue: Secondary | ICD-10-CM | POA: Insufficient documentation

## 2024-09-25 DIAGNOSIS — I252 Old myocardial infarction: Secondary | ICD-10-CM | POA: Insufficient documentation

## 2024-09-25 DIAGNOSIS — R7989 Other specified abnormal findings of blood chemistry: Secondary | ICD-10-CM | POA: Insufficient documentation

## 2024-09-25 DIAGNOSIS — E1122 Type 2 diabetes mellitus with diabetic chronic kidney disease: Secondary | ICD-10-CM | POA: Insufficient documentation

## 2024-09-25 DIAGNOSIS — Z951 Presence of aortocoronary bypass graft: Secondary | ICD-10-CM | POA: Insufficient documentation

## 2024-09-25 DIAGNOSIS — R55 Syncope and collapse: Secondary | ICD-10-CM | POA: Insufficient documentation

## 2024-09-25 LAB — CBC
HCT: 44 % (ref 39.0–52.0)
Hemoglobin: 13.7 g/dL (ref 13.0–17.0)
MCH: 27.8 pg (ref 26.0–34.0)
MCHC: 31.1 g/dL (ref 30.0–36.0)
MCV: 89.4 fL (ref 80.0–100.0)
Platelets: 118 K/uL — ABNORMAL LOW (ref 150–400)
RBC: 4.92 MIL/uL (ref 4.22–5.81)
RDW: 15.7 % — ABNORMAL HIGH (ref 11.5–15.5)
WBC: 4.4 K/uL (ref 4.0–10.5)
nRBC: 0 % (ref 0.0–0.2)

## 2024-09-25 LAB — PRO BRAIN NATRIURETIC PEPTIDE: Pro Brain Natriuretic Peptide: 33325 pg/mL — ABNORMAL HIGH (ref <300.0)

## 2024-09-25 LAB — BASIC METABOLIC PANEL WITH GFR
Anion gap: 12 (ref 5–15)
BUN: 57 mg/dL — ABNORMAL HIGH (ref 8–23)
CO2: 27 mmol/L (ref 22–32)
Calcium: 8.8 mg/dL — ABNORMAL LOW (ref 8.9–10.3)
Chloride: 105 mmol/L (ref 98–111)
Creatinine, Ser: 2.35 mg/dL — ABNORMAL HIGH (ref 0.61–1.24)
GFR, Estimated: 26 mL/min — ABNORMAL LOW (ref >60)
Glucose, Bld: 128 mg/dL — ABNORMAL HIGH (ref 70–99)
Potassium: 4 mmol/L (ref 3.5–5.1)
Sodium: 145 mmol/L (ref 135–145)

## 2024-09-25 NOTE — Patient Instructions (Addendum)
 Medication Changes:  No medication changes today!  Lab Work:  Go downstairs to [facility] on [address] to have your blood work completed.  We will only call you if the results are abnormal or if the provider would like to make medication changes.  No news is good news.    Referrals:  You have been referred to [facility] for an overnight oxymetry. They will contact you in order to schedule your appointment.  You have been referred to [facility]. They will contact you in order to schedule your appointment.    Follow-Up in: Please follow up with the [facility] in 2 weeks with Ellouise Class, FNP.   Thank you for choosing [facility].    At the [facility], you and your health needs are our priority. We have a designated team specialized in the treatment of Heart Failure. This Care Team includes your primary Heart Failure Specialized Cardiologist (physician), Advanced Practice Providers (APPs- Physician Assistants and Nurse Practitioners), and Pharmacist who all work together to provide you with the care you need, when you need it.   You may see any of the following providers on your designated Care Team at your next follow up:  Dr. Toribio Fuel Dr. Ezra Shuck Dr. Ria Commander Dr. Morene Brownie Ellouise Class, FNP Jaun Bash, RPH-CPP  Please be sure to bring in all your medications bottles to every appointment.   Need to Contact Us :  If you have any questions or concerns before your next appointment please send us  a message through [place] or call our office at [phone].    TO LEAVE A MESSAGE FOR THE NURSE SELECT OPTION 2, PLEASE LEAVE A MESSAGE INCLUDING: YOUR NAME DATE OF BIRTH CALL BACK NUMBER REASON FOR CALL**this is important as we prioritize the call backs  YOU WILL RECEIVE A CALL BACK THE SAME DAY AS LONG AS YOU CALL BEFORE 4:00 PM

## 2024-09-25 NOTE — Progress Notes (Signed)
 Faxed over order, demos, and ov note to [facility] for overnight oximetry. Fax successful.

## 2024-09-26 NOTE — Telephone Encounter (Signed)
 Spoke to pt about lab work. Pt agreeable to additional medication and labs. Orders placed. No further questions.

## 2024-09-27 ENCOUNTER — Telehealth: Payer: Self-pay

## 2024-09-27 NOTE — Telephone Encounter (Signed)
 Called pt lvm for pt to call back to send a remote transmission. Pt is having syncopal events

## 2024-09-28 NOTE — Telephone Encounter (Signed)
 Need to switch Merlin back to 91 day schedule.

## 2024-09-28 NOTE — Telephone Encounter (Signed)
-----   Message from Nurse Damien DEL sent at 09/27/2024 11:50 AM EST ----- Regarding: FW: pt syncopal events Ellouise Class, NP, is requesting a device remote (confirmed with Brittany).  Thanks, Damien ----- Message ----- From: Sharl Laymon LABOR, RN Sent: 09/25/2024  10:06 AM EST To: Cv Div Ep Follow Up Subject: pt syncopal events                             Pt is having syncopal events and needs to be followed up with. Can we get this scheduled?

## 2024-09-30 ENCOUNTER — Ambulatory Visit: Attending: Internal Medicine

## 2024-09-30 NOTE — Progress Notes (Signed)
  Subjective:  Patient ID: [name], male    DOB: 26-Dec-1934,  MRN: [id number]  [name] presents to clinic today for at risk foot care. Pt has h/o NIDDM with PAD and painful elongated mycotic toenails 1-5 bilaterally which are tender when wearing enclosed shoe gear. Pain is relieved with periodic professional debridement.  Chief Complaint  Patient presents with   Diabetes    A1c 7.3 Dr. Marnie is his PCP. Last visit was last week.   New problem(s): None.   PCP is [name], MD.  Allergies  Allergen Reactions   Angiotensin Receptor Blockers     hyperkalemia   Metformin Diarrhea    Review of Systems: Negative except as noted in the HPI.  Objective: No changes noted in today's physical examination. There were no vitals filed for this visit. [name] is a pleasant 89 y.o. male in NAD. AAO x 3.  Vascular Examination: CFT <3 seconds b/l. DP pulses faintly palpable b/l. PT pulses nonpalpable b/l. Digital hair absent. Skin temperature gradient warm to warm b/l. No pain with calf compression. No ischemia or gangrene. No cyanosis or clubbing noted b/l. +1 pitting edema noted BLE.   Neurological Examination: Sensation grossly intact b/l with 10 gram monofilament.   Dermatological Examination: Pedal skin warm and supple b/l. No open wounds b/l. No interdigital macerations. Toenails 1-5 b/l thick, discolored, elongated with subungual debris and pain on dorsal palpation. No hyperkeratotic nor porokeratotic lesions.  Musculoskeletal Examination: Muscle strength 5/5 to all lower extremity muscle groups bilaterally. No pain, crepitus or joint limitation noted with ROM bilateral LE. Pes planus deformity noted bilateral LE. Utilizes cane for ambulation assistance.  Radiographs: None  Last HgA1c:      Latest Ref Rng & Units 07/07/2024    6:37 AM 12/23/2023    2:46 AM  Hemoglobin A1C  Hemoglobin-A1c 4.8 - 5.6 % 7.3  7.5    Assessment/Plan: 1. Pain due to onychomycosis  of toenails of both feet   2. Type II diabetes mellitus with peripheral circulatory disorder [facility])   Patient was evaluated and treated. All patient's and/or POA's questions/concerns addressed on today's visit. Mycotic toenails 1-5 b/l debrided in length and girth without incident.  Continue daily foot inspections and monitor blood glucose per PCP/Endocrinologist's recommendations.Continue soft, supportive shoe gear daily. Report any pedal injuries to medical professional. Call office if there are any quesitons/concerns. -Patient/POA to call should there be question/concern in the interim.   Return in about 3 months (around 12/25/2024).  Delon LITTIE Merlin, DPM      [place] LOCATION: [address], [place] [address]                   Office [phone]   [facility] LOCATION: [address] [place], [place] [address] Office [phone]

## 2024-10-02 NOTE — Telephone Encounter (Signed)
 Switched back to 91 days in [place].

## 2024-10-02 NOTE — Progress Notes (Unsigned)
 Electrophysiology Clinic Note    Date:  10/03/2024  Patient ID:  [name], [name] 1934-12-02, MRN [id number] PCP:  [name], MD  Cardiologist:  Deatrice Cage, MD HF cardiologist: Rolan IVER Class  Electrophysiologist:  OLE ONEIDA HOLTS, MD  Electrophysiology APP:  Owenn Rothermel, NP     Discussed the use of AI scribe software for clinical note transcription with the patient, who gave verbal consent to proceed.   Patient Profile    Chief Complaint: CRT follow-up  History of Present Illness: [name] is a 89 y.o. 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   AFib diagnosed via device. I last saw him 05/2023 where he had stable DOE.  He was hospitalized 12/2023, 03/2024, 06/2024, and 08/2024 all for AoCHF exacerbation. He was also hospitalized 09/15/2024 for GI bleed, HgB remained stable. GI consulted who did not recommend EGD, no further bleeding. Eliquis  held at discharge until 11/14.   He saw NP [facility] 11/18 where he described syncopal episode of unclear cause. BNP significantly elevated so increased metolazone .   On follow-up today, he is a little SOB, states that it is stable x many months. He has resume his eliquis  without any further bleeding concerns. His daughter checks BP and he remembers readings 130-140, rarely 160s.   He denies chest pain, chest pressure, palpitations. States his lower leg swelling is stable, but has not improved with the increased metolazone  dosing.       Arrhythmia/Device History [employer] Jude CRT-P, imp 10/2021; dx CHB, HFrEF     ROS:  Please see the history of present illness. All other systems are reviewed and otherwise negative.    Physical Exam    VS:  BP (!) 143/99   Pulse 87   Ht 5' 8 (1.727 m)   Wt 224 lb 9.6 oz (101.9 kg)   SpO2 96%    BMI 34.15 kg/m  BMI: Body mass index is 34.15 kg/m.           Wt Readings from Last 3 Encounters:  10/03/24 224 lb 9.6 oz (101.9 kg)  09/25/24 220 lb (99.8 kg)  09/15/24 206 lb (93.4 kg)     GEN- The patient is well appearing, alert and oriented x 3 today.   Lungs- Clear to ausculation bilaterally, normal work of breathing.  Heart- Irregularly irregular rate and rhythm, no murmurs, rubs or gallops Extremities- 2-3+ peripheral edema, warm, dry Skin-  device pocket well-healed, no tethering   Device interrogation done today and reviewed by myself:  Battery 2.9 years Lead thresholds, impedence, sensing stable  Dependent down to VVI 40 Frequent PACs during interrogation Several AMS episodes, appear to be very short AFib vs consecutive PACs CorVue elevated, but appears to be improving No changes made today   Studies Reviewed   Previous EP, cardiology notes.    EKG is not ordered. Personal review of EKG from 09/17/2024 shows:  AV paced at 77, QRS        TTE, 08/17/2024  1. Left ventricular ejection fraction, by estimation, is 25 to 30%. The left ventricle has severely decreased function. The left ventricle demonstrates regional wall motion abnormalities (see scoring diagram/findings for description). There is mild left  ventricular hypertrophy. Left ventricular diastolic parameters are indeterminate.   2. Right ventricular  systolic function is moderately reduced. The right ventricular size is moderately enlarged.   3. Large pleural effusion.   4. The mitral valve was not well visualized. Mild mitral valve regurgitation. No evidence of mitral stenosis.   5. Tricuspid valve regurgitation is mild to moderate.   6. The aortic valve is tricuspid. There is mild calcification of the aortic valve. Aortic valve regurgitation is trivial. Aortic valve sclerosis is present, with no evidence of aortic valve stenosis.   Comparison(s): A prior study was performed on 12/24/2023. No  significant change from prior study.   TTE, 12/16/2022  1. Left ventricular ejection fraction, by estimation, is 30 to 35%. The left ventricle has moderately decreased function. The left ventricle demonstrates global hypokinesis with severe hypokinesis of the anterior wall. The left ventricular internal cavity size was mildly dilated. There is moderate asymmetric left ventricular hypertrophy of the septal segment.   2. Right ventricle is not well visualized. Right ventricular systolic function is grossly normal. The right ventricular size is normal. Tricuspid regurgitation signal is inadequate for assessing PA pressure.   3. Left atrial size was mildly dilated.   4. The mitral valve is normal in structure. Trivial mitral valve regurgitation. No evidence of mitral stenosis.   5. The aortic valve is normal in structure. Aortic valve regurgitation is not visualized. Aortic valve sclerosis is present, with no evidence of aortic valve stenosis.   6. The inferior vena cava is normal in size with greater than 50% respiratory variability, suggesting right atrial pressure of 3 mmHg.   Assessment and Plan     #) CHB, ICM s/p CRT-P Device functioning appropriately, see paceart for details Dependent down to VVI 40 Lead measurements stable Very frequent PACs throughout interrogation, see below  #) parox Afib #) PAC Frequent brief episodes of AFib vs consecutive PACs  3.3% AF burden via device Patient is asymptomatic Will increase coreg  to 12.5mg  BID. Based on home BP readings, it appears he will tolerate increase dose. If BP significantly reduces or has off-target effects from increased coreg , consider initiating amiodarone   #) Hypercoag d/t parox afib #) h/o GI bleed CHA2DS2-VASc Score = at least 6 [CHF History: 1, HTN History: 1, Diabetes History: 1, Stroke History: 0, Vascular Disease History: 1, Age Score: 2, Gender Score: 0].  Therefore, the patient's annual risk of stroke is 9.7 %.    Stroke  ppx - 2.5mg  eliquis  BID, dose appropriately reduced for Age, Cr  Recently hospitalized with GI bleed with stable HgB Update CBC today Referred to GI, awaiting appt Continue 2.5mg  eliquis  at this time No current bleeding concerns  #) HFrEF CTA with increased lower extremity edema CorVue elevated, but improving He follows regularly with HF team, who are managing diuretics       Current medicines are reviewed at length with the patient today.   The patient does not have concerns regarding his medicines.  The following changes were made today:   INCREASE coreg  to 12.5mg  twice a day  Labs/ tests ordered today include:  No orders of the defined types were placed in this encounter.    Disposition: Follow up with Dr. Kennyth or EP APP in 6 months   Signed, Serrita Lueth, NP  10/03/24  12:22 PM  Electrophysiology [facility]

## 2024-10-03 ENCOUNTER — Ambulatory Visit: Attending: Cardiology | Admitting: Cardiology

## 2024-10-03 ENCOUNTER — Encounter: Payer: Self-pay | Admitting: Cardiology

## 2024-10-03 VITALS — BP 143/99 | HR 87 | Ht 68.0 in | Wt 224.6 lb

## 2024-10-03 DIAGNOSIS — Z950 Presence of cardiac pacemaker: Secondary | ICD-10-CM | POA: Diagnosis not present

## 2024-10-03 DIAGNOSIS — K922 Gastrointestinal hemorrhage, unspecified: Secondary | ICD-10-CM

## 2024-10-03 DIAGNOSIS — D6869 Other thrombophilia: Secondary | ICD-10-CM

## 2024-10-03 DIAGNOSIS — I255 Ischemic cardiomyopathy: Secondary | ICD-10-CM | POA: Diagnosis not present

## 2024-10-03 DIAGNOSIS — I4891 Unspecified atrial fibrillation: Secondary | ICD-10-CM

## 2024-10-03 DIAGNOSIS — I5022 Chronic systolic (congestive) heart failure: Secondary | ICD-10-CM

## 2024-10-03 DIAGNOSIS — I480 Paroxysmal atrial fibrillation: Secondary | ICD-10-CM

## 2024-10-03 DIAGNOSIS — I442 Atrioventricular block, complete: Secondary | ICD-10-CM

## 2024-10-03 LAB — CUP PACEART INCLINIC DEVICE CHECK
Battery Remaining Longevity: 34 mo
Battery Voltage: 2.96 V
Brady Statistic RA Percent Paced: 60 %
Brady Statistic RV Percent Paced: 98 %
Date Time Interrogation Session: 20251126122940
Implantable Lead Connection Status: 753985
Implantable Lead Connection Status: 753985
Implantable Lead Connection Status: 753985
Implantable Lead Implant Date: 20221205
Implantable Lead Implant Date: 20221205
Implantable Lead Implant Date: 20221205
Implantable Lead Location: 753858
Implantable Lead Location: 753859
Implantable Lead Location: 753860
Implantable Lead Model: 3830
Implantable Pulse Generator Implant Date: 20221205
Lead Channel Impedance Value: 400 Ohm
Lead Channel Impedance Value: 412.5 Ohm
Lead Channel Impedance Value: 487.5 Ohm
Lead Channel Pacing Threshold Amplitude: 0.75 V
Lead Channel Pacing Threshold Amplitude: 0.75 V
Lead Channel Pacing Threshold Amplitude: 1 V
Lead Channel Pacing Threshold Amplitude: 1 V
Lead Channel Pacing Threshold Amplitude: 2.25 V
Lead Channel Pacing Threshold Amplitude: 2.25 V
Lead Channel Pacing Threshold Pulse Width: 0.5 ms
Lead Channel Pacing Threshold Pulse Width: 0.5 ms
Lead Channel Pacing Threshold Pulse Width: 0.5 ms
Lead Channel Pacing Threshold Pulse Width: 0.5 ms
Lead Channel Pacing Threshold Pulse Width: 0.5 ms
Lead Channel Pacing Threshold Pulse Width: 0.5 ms
Lead Channel Sensing Intrinsic Amplitude: 11.2 mV
Lead Channel Sensing Intrinsic Amplitude: 2.7 mV
Lead Channel Setting Pacing Amplitude: 2 V
Lead Channel Setting Pacing Amplitude: 2 V
Lead Channel Setting Pacing Amplitude: 2.125
Lead Channel Setting Pacing Pulse Width: 0.5 ms
Lead Channel Setting Pacing Pulse Width: 0.5 ms
Lead Channel Setting Sensing Sensitivity: 2 mV
Pulse Gen Model: 3222
Pulse Gen Serial Number: 3901949

## 2024-10-03 MED ORDER — CARVEDILOL 12.5 MG PO TABS: 12.5000 mg | ORAL_TABLET | Freq: Two times a day (BID) | ORAL | 3 refills | Status: DC

## 2024-10-03 NOTE — Addendum Note (Signed)
 Addended by: Bain Whichard A on: 10/03/2024 11:47 AM   Modules accepted: Orders

## 2024-10-03 NOTE — Patient Instructions (Addendum)
 Medication Instructions:  Your physician recommends the following medication changes.   INCREASE: Coreg  to 12.5 mg two times a day. You may use the remaining supply of the 6.25 mg pills by taking two of the 6.25 mg pills twice a day. Start the new 12.5 mg pills when you run out of the old 6.25 mg prescription. Then take one 12.5 mg pill two times a day.   *If you need a refill on your cardiac medications before your next appointment, please call your pharmacy*  Lab Work: Your provider would like for you to have following labs drawn today CBC, BNP and BMP.     Testing/Procedures: No test ordered today   Follow-Up: At [facility], you and your health needs are our priority.  As part of our continuing mission to provide you with exceptional heart care, our providers are all part of one team.  This team includes your primary Cardiologist (physician) and Advanced Practice Providers or APPs (Physician Assistants and Nurse Practitioners) who all work together to provide you with the care you need, when you need it.  Your next appointment:   6 month(s)  Provider:   Suzann Riddle, NP

## 2024-10-04 LAB — CBC
Hematocrit: 40.4 % (ref 37.5–51.0)
Hemoglobin: 12.7 g/dL — ABNORMAL LOW (ref 13.0–17.7)
MCH: 27.9 pg (ref 26.6–33.0)
MCHC: 31.4 g/dL — ABNORMAL LOW (ref 31.5–35.7)
MCV: 89 fL (ref 79–97)
Platelets: 97 x10E3/uL — CL (ref 150–450)
RBC: 4.55 x10E6/uL (ref 4.14–5.80)
RDW: 14.8 % (ref 11.6–15.4)
WBC: 3.4 x10E3/uL (ref 3.4–10.8)

## 2024-10-04 LAB — BASIC METABOLIC PANEL WITH GFR
BUN/Creatinine Ratio: 25 — ABNORMAL HIGH (ref 10–24)
BUN: 69 mg/dL — ABNORMAL HIGH (ref 8–27)
CO2: 22 mmol/L (ref 20–29)
Calcium: 8 mg/dL — ABNORMAL LOW (ref 8.6–10.2)
Chloride: 102 mmol/L (ref 96–106)
Creatinine, Ser: 2.73 mg/dL — ABNORMAL HIGH (ref 0.76–1.27)
Glucose: 120 mg/dL — ABNORMAL HIGH (ref 70–99)
Potassium: 3.7 mmol/L (ref 3.5–5.2)
Sodium: 140 mmol/L (ref 134–144)
eGFR: 22 mL/min/1.73 — ABNORMAL LOW (ref >59)

## 2024-10-04 LAB — PRO B NATRIURETIC PEPTIDE: NT-Pro BNP: 20348 pg/mL — ABNORMAL HIGH (ref 0–486)

## 2024-10-07 ENCOUNTER — Ambulatory Visit: Payer: Self-pay | Admitting: Family

## 2024-10-08 ENCOUNTER — Ambulatory Visit: Payer: Self-pay | Admitting: Cardiology

## 2024-10-08 ENCOUNTER — Telehealth: Payer: Self-pay | Admitting: Family
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
## 2024-10-08 NOTE — Telephone Encounter (Signed)
 Called to confirm/remind patient of their appointment at the [facility] on 10/09/24.   Appointment:   [x] Confirmed  [] Left mess   [] No answer/No voice mail  [] VM Full/unable to leave message  [] Phone not in service  Patient reminded to bring all medications and/or complete list.  Confirmed patient has transportation. Gave directions, instructed to utilize valet parking.

## 2024-10-09 ENCOUNTER — Encounter: Payer: Self-pay | Admitting: Family

## 2024-10-09 ENCOUNTER — Ambulatory Visit: Attending: Family | Admitting: Family

## 2024-10-09 ENCOUNTER — Other Ambulatory Visit: Payer: Self-pay | Admitting: Family

## 2024-10-09 ENCOUNTER — Encounter

## 2024-10-09 VITALS — BP 130/83 | HR 62 | Wt 227.5 lb

## 2024-10-09 DIAGNOSIS — I083 Combined rheumatic disorders of mitral, aortic and tricuspid valves: Secondary | ICD-10-CM | POA: Insufficient documentation

## 2024-10-09 DIAGNOSIS — G4734 Idiopathic sleep related nonobstructive alveolar hypoventilation: Secondary | ICD-10-CM | POA: Insufficient documentation

## 2024-10-09 DIAGNOSIS — N183 Chronic kidney disease, stage 3 unspecified: Secondary | ICD-10-CM | POA: Diagnosis not present

## 2024-10-09 DIAGNOSIS — I480 Paroxysmal atrial fibrillation: Secondary | ICD-10-CM | POA: Diagnosis not present

## 2024-10-09 DIAGNOSIS — Z7984 Long term (current) use of oral hypoglycemic drugs: Secondary | ICD-10-CM | POA: Insufficient documentation

## 2024-10-09 DIAGNOSIS — Z79899 Other long term (current) drug therapy: Secondary | ICD-10-CM | POA: Insufficient documentation

## 2024-10-09 DIAGNOSIS — I5022 Chronic systolic (congestive) heart failure: Secondary | ICD-10-CM | POA: Insufficient documentation

## 2024-10-09 DIAGNOSIS — R5383 Other fatigue: Secondary | ICD-10-CM | POA: Insufficient documentation

## 2024-10-09 DIAGNOSIS — I2510 Atherosclerotic heart disease of native coronary artery without angina pectoris: Secondary | ICD-10-CM | POA: Diagnosis not present

## 2024-10-09 DIAGNOSIS — Z794 Long term (current) use of insulin: Secondary | ICD-10-CM | POA: Insufficient documentation

## 2024-10-09 DIAGNOSIS — E1122 Type 2 diabetes mellitus with diabetic chronic kidney disease: Secondary | ICD-10-CM | POA: Insufficient documentation

## 2024-10-09 DIAGNOSIS — Z7901 Long term (current) use of anticoagulants: Secondary | ICD-10-CM | POA: Insufficient documentation

## 2024-10-09 DIAGNOSIS — K922 Gastrointestinal hemorrhage, unspecified: Secondary | ICD-10-CM

## 2024-10-09 DIAGNOSIS — Z951 Presence of aortocoronary bypass graft: Secondary | ICD-10-CM | POA: Insufficient documentation

## 2024-10-09 DIAGNOSIS — R600 Localized edema: Secondary | ICD-10-CM | POA: Insufficient documentation

## 2024-10-09 DIAGNOSIS — I130 Hypertensive heart and chronic kidney disease with heart failure and stage 1 through stage 4 chronic kidney disease, or unspecified chronic kidney disease: Secondary | ICD-10-CM | POA: Insufficient documentation

## 2024-10-09 DIAGNOSIS — I255 Ischemic cardiomyopathy: Secondary | ICD-10-CM | POA: Insufficient documentation

## 2024-10-09 MED ORDER — POTASSIUM CHLORIDE CRYS ER 20 MEQ PO TBCR: 40.0000 meq | EXTENDED_RELEASE_TABLET | Freq: Every day | ORAL | 5 refills | Status: DC

## 2024-10-09 MED ORDER — SACUBITRIL-VALSARTAN 49-51 MG PO TABS: 1.0000 | ORAL_TABLET | Freq: Two times a day (BID) | ORAL | 6 refills | Status: DC

## 2024-10-09 MED ORDER — CARVEDILOL 12.5 MG PO TABS: 12.5000 mg | ORAL_TABLET | Freq: Two times a day (BID) | ORAL | 3 refills | Status: DC

## 2024-10-09 NOTE — Progress Notes (Signed)
 Faxed oxygen orders to [facility].

## 2024-10-09 NOTE — Patient Instructions (Signed)
 Medication Changes:  INCREASE Carvedilol  to 12.5mg  (1 tab) two times daily  TAKE Potassium 40meq (2 tabs) daily  TAKE Entresto  49-51mg  (1 tab) two times daily   Special Instructions // Education:  [facility] tomorrow 10/10/24 to get IV lasix .  Follow-Up in: Please follow up with the [facility] Failure Clinic in 1 week with Ellouise Class, FNP.   Thank you for choosing [facility].    At the Advanced [facility] Failure Clinic, you and your health needs are our priority. We have a designated team specialized in the treatment of Heart Failure. This Care Team includes your primary Heart Failure Specialized Cardiologist (physician), Advanced Practice Providers (APPs- Physician Assistants and Nurse Practitioners), and Pharmacist who all work together to provide you with the care you need, when you need it.   You may see any of the following providers on your designated Care Team at your next follow up:  Dr. Toribio Fuel Dr. Ezra Shuck Dr. Ria Commander Dr. Morene Brownie Ellouise Class, FNP Jaun Bash, RPH-CPP  Please be sure to bring in all your medications bottles to every appointment.   Need to Contact Us :  If you have any questions or concerns before your next appointment please send us  a message through [place] or call our office at [phone].    TO LEAVE A MESSAGE FOR THE NURSE SELECT OPTION 2, PLEASE LEAVE A MESSAGE INCLUDING: YOUR NAME DATE OF BIRTH CALL BACK NUMBER REASON FOR CALL**this is important as we prioritize the call backs  YOU WILL RECEIVE A CALL BACK THE SAME DAY AS LONG AS YOU CALL BEFORE 4:00 PM

## 2024-10-10 ENCOUNTER — Ambulatory Visit: Payer: Self-pay | Admitting: Family

## 2024-10-10 ENCOUNTER — Ambulatory Visit: Admission: RE | Admit: 2024-10-10 | Discharge: 2024-10-10 | Attending: Family

## 2024-10-10 DIAGNOSIS — I5022 Chronic systolic (congestive) heart failure: Secondary | ICD-10-CM | POA: Diagnosis present

## 2024-10-10 DIAGNOSIS — D696 Thrombocytopenia, unspecified: Secondary | ICD-10-CM

## 2024-10-10 LAB — CBC
HCT: 45.1 % (ref 39.0–52.0)
Hemoglobin: 14.2 g/dL (ref 13.0–17.0)
MCH: 27.9 pg (ref 26.0–34.0)
MCHC: 31.5 g/dL (ref 30.0–36.0)
MCV: 88.6 fL (ref 80.0–100.0)
Platelets: 107 K/uL — ABNORMAL LOW (ref 150–400)
RBC: 5.09 MIL/uL (ref 4.22–5.81)
RDW: 16 % — ABNORMAL HIGH (ref 11.5–15.5)
WBC: 4.3 K/uL (ref 4.0–10.5)
nRBC: 0 % (ref 0.0–0.2)

## 2024-10-10 LAB — BASIC METABOLIC PANEL WITH GFR
Anion gap: 13 (ref 5–15)
BUN: 67 mg/dL — ABNORMAL HIGH (ref 8–23)
CO2: 24 mmol/L (ref 22–32)
Calcium: 8 mg/dL — ABNORMAL LOW (ref 8.9–10.3)
Chloride: 105 mmol/L (ref 98–111)
Creatinine, Ser: 2.83 mg/dL — ABNORMAL HIGH (ref 0.61–1.24)
GFR, Estimated: 21 mL/min — ABNORMAL LOW (ref >60)
Glucose, Bld: 122 mg/dL — ABNORMAL HIGH (ref 70–99)
Potassium: 3.9 mmol/L (ref 3.5–5.1)
Sodium: 142 mmol/L (ref 135–145)

## 2024-10-10 LAB — PRO BRAIN NATRIURETIC PEPTIDE: Pro Brain Natriuretic Peptide: 24507 pg/mL — ABNORMAL HIGH (ref <300.0)

## 2024-10-10 MED ORDER — POTASSIUM CHLORIDE CRYS ER 20 MEQ PO TBCR
EXTENDED_RELEASE_TABLET | ORAL | Status: AC
  Filled 2024-10-10: qty 2

## 2024-10-10 MED ORDER — POTASSIUM CHLORIDE CRYS ER 20 MEQ PO TBCR
40.0000 meq | EXTENDED_RELEASE_TABLET | Freq: Once | ORAL | Status: AC
  Administered 2024-10-10: 40 meq via ORAL

## 2024-10-10 MED ORDER — FUROSEMIDE 10 MG/ML IJ SOLN
80.0000 mg | Freq: Once | INTRAMUSCULAR | Status: AC
  Administered 2024-10-10: 80 mg via INTRAVENOUS

## 2024-10-10 MED ORDER — FUROSEMIDE 10 MG/ML IJ SOLN
INTRAMUSCULAR | Status: AC
  Filled 2024-10-10: qty 8

## 2024-10-10 NOTE — Telephone Encounter (Signed)
 Called pt with updated labs and new referral order. Pt agreeable to hem referral. Orders placed. No further questions.

## 2024-10-15 ENCOUNTER — Telehealth: Payer: Self-pay | Admitting: Family

## 2024-10-15 ENCOUNTER — Emergency Department

## 2024-10-15 ENCOUNTER — Inpatient Hospital Stay
Admission: EM | Admit: 2024-10-15 | Discharge: 2024-10-23 | DRG: 871 | Disposition: A | Attending: Internal Medicine | Admitting: Internal Medicine

## 2024-10-15 ENCOUNTER — Other Ambulatory Visit: Payer: Self-pay

## 2024-10-15 DIAGNOSIS — Z515 Encounter for palliative care: Secondary | ICD-10-CM | POA: Diagnosis not present

## 2024-10-15 DIAGNOSIS — E1165 Type 2 diabetes mellitus with hyperglycemia: Secondary | ICD-10-CM | POA: Diagnosis present

## 2024-10-15 DIAGNOSIS — N3000 Acute cystitis without hematuria: Secondary | ICD-10-CM

## 2024-10-15 DIAGNOSIS — J9621 Acute and chronic respiratory failure with hypoxia: Secondary | ICD-10-CM | POA: Diagnosis present

## 2024-10-15 DIAGNOSIS — I442 Atrioventricular block, complete: Secondary | ICD-10-CM | POA: Diagnosis not present

## 2024-10-15 DIAGNOSIS — I480 Paroxysmal atrial fibrillation: Secondary | ICD-10-CM | POA: Diagnosis present

## 2024-10-15 DIAGNOSIS — D696 Thrombocytopenia, unspecified: Secondary | ICD-10-CM | POA: Diagnosis present

## 2024-10-15 DIAGNOSIS — I5023 Acute on chronic systolic (congestive) heart failure: Secondary | ICD-10-CM | POA: Diagnosis present

## 2024-10-15 DIAGNOSIS — E669 Obesity, unspecified: Secondary | ICD-10-CM | POA: Diagnosis present

## 2024-10-15 DIAGNOSIS — R0603 Acute respiratory distress: Principal | ICD-10-CM

## 2024-10-15 DIAGNOSIS — N3001 Acute cystitis with hematuria: Secondary | ICD-10-CM

## 2024-10-15 DIAGNOSIS — J9601 Acute respiratory failure with hypoxia: Secondary | ICD-10-CM | POA: Diagnosis not present

## 2024-10-15 DIAGNOSIS — R7989 Other specified abnormal findings of blood chemistry: Secondary | ICD-10-CM | POA: Diagnosis not present

## 2024-10-15 DIAGNOSIS — I5A Non-ischemic myocardial injury (non-traumatic): Secondary | ICD-10-CM | POA: Diagnosis present

## 2024-10-15 DIAGNOSIS — J9811 Atelectasis: Secondary | ICD-10-CM | POA: Diagnosis present

## 2024-10-15 DIAGNOSIS — N390 Urinary tract infection, site not specified: Secondary | ICD-10-CM | POA: Diagnosis present

## 2024-10-15 DIAGNOSIS — T68XXXA Hypothermia, initial encounter: Secondary | ICD-10-CM | POA: Diagnosis present

## 2024-10-15 DIAGNOSIS — I130 Hypertensive heart and chronic kidney disease with heart failure and stage 1 through stage 4 chronic kidney disease, or unspecified chronic kidney disease: Secondary | ICD-10-CM | POA: Diagnosis present

## 2024-10-15 DIAGNOSIS — E1129 Type 2 diabetes mellitus with other diabetic kidney complication: Secondary | ICD-10-CM | POA: Diagnosis present

## 2024-10-15 DIAGNOSIS — I255 Ischemic cardiomyopathy: Secondary | ICD-10-CM | POA: Diagnosis present

## 2024-10-15 DIAGNOSIS — N184 Chronic kidney disease, stage 4 (severe): Secondary | ICD-10-CM | POA: Diagnosis present

## 2024-10-15 DIAGNOSIS — Z794 Long term (current) use of insulin: Secondary | ICD-10-CM | POA: Diagnosis not present

## 2024-10-15 DIAGNOSIS — Z66 Do not resuscitate: Secondary | ICD-10-CM | POA: Diagnosis present

## 2024-10-15 DIAGNOSIS — I2510 Atherosclerotic heart disease of native coronary artery without angina pectoris: Secondary | ICD-10-CM | POA: Diagnosis not present

## 2024-10-15 DIAGNOSIS — I10 Essential (primary) hypertension: Secondary | ICD-10-CM | POA: Diagnosis present

## 2024-10-15 DIAGNOSIS — E872 Acidosis, unspecified: Secondary | ICD-10-CM | POA: Diagnosis present

## 2024-10-15 DIAGNOSIS — E66812 Obesity, class 2: Secondary | ICD-10-CM | POA: Diagnosis present

## 2024-10-15 DIAGNOSIS — K6289 Other specified diseases of anus and rectum: Secondary | ICD-10-CM | POA: Diagnosis not present

## 2024-10-15 DIAGNOSIS — R188 Other ascites: Secondary | ICD-10-CM | POA: Diagnosis present

## 2024-10-15 DIAGNOSIS — R680 Hypothermia, not associated with low environmental temperature: Secondary | ICD-10-CM | POA: Diagnosis present

## 2024-10-15 DIAGNOSIS — R0902 Hypoxemia: Secondary | ICD-10-CM

## 2024-10-15 DIAGNOSIS — Z1152 Encounter for screening for COVID-19: Secondary | ICD-10-CM | POA: Diagnosis not present

## 2024-10-15 DIAGNOSIS — A419 Sepsis, unspecified organism: Secondary | ICD-10-CM | POA: Diagnosis present

## 2024-10-15 DIAGNOSIS — R6520 Severe sepsis without septic shock: Secondary | ICD-10-CM | POA: Diagnosis present

## 2024-10-15 DIAGNOSIS — E785 Hyperlipidemia, unspecified: Secondary | ICD-10-CM | POA: Diagnosis present

## 2024-10-15 DIAGNOSIS — Z7901 Long term (current) use of anticoagulants: Secondary | ICD-10-CM | POA: Diagnosis not present

## 2024-10-15 DIAGNOSIS — E1122 Type 2 diabetes mellitus with diabetic chronic kidney disease: Secondary | ICD-10-CM | POA: Diagnosis present

## 2024-10-15 DIAGNOSIS — B9620 Unspecified Escherichia coli [E. coli] as the cause of diseases classified elsewhere: Secondary | ICD-10-CM | POA: Diagnosis present

## 2024-10-15 LAB — CBC WITH DIFFERENTIAL/PLATELET
Abs Immature Granulocytes: 0.02 K/uL (ref 0.00–0.07)
Basophils Absolute: 0 K/uL (ref 0.0–0.1)
Basophils Relative: 1 %
Eosinophils Absolute: 0.1 K/uL (ref 0.0–0.5)
Eosinophils Relative: 2 %
HCT: 50.4 % (ref 39.0–52.0)
Hemoglobin: 15.9 g/dL (ref 13.0–17.0)
Immature Granulocytes: 0 %
Lymphocytes Relative: 17 %
Lymphs Abs: 1 K/uL (ref 0.7–4.0)
MCH: 28.1 pg (ref 26.0–34.0)
MCHC: 31.5 g/dL (ref 30.0–36.0)
MCV: 89 fL (ref 80.0–100.0)
Monocytes Absolute: 0.5 K/uL (ref 0.1–1.0)
Monocytes Relative: 9 %
Neutro Abs: 4 K/uL (ref 1.7–7.7)
Neutrophils Relative %: 71 %
Platelets: 116 K/uL — ABNORMAL LOW (ref 150–400)
RBC: 5.66 MIL/uL (ref 4.22–5.81)
RDW: 16 % — ABNORMAL HIGH (ref 11.5–15.5)
WBC: 5.6 K/uL (ref 4.0–10.5)
nRBC: 0 % (ref 0.0–0.2)

## 2024-10-15 LAB — RESP PANEL BY RT-PCR (RSV, FLU A&B, COVID)  RVPGX2
Influenza A by PCR: NEGATIVE
Influenza B by PCR: NEGATIVE
Resp Syncytial Virus by PCR: NEGATIVE
SARS Coronavirus 2 by RT PCR: NEGATIVE

## 2024-10-15 LAB — COMPREHENSIVE METABOLIC PANEL WITH GFR
ALT: 8 U/L (ref 0–44)
AST: 29 U/L (ref 15–41)
Albumin: 4.4 g/dL (ref 3.5–5.0)
Alkaline Phosphatase: 85 U/L (ref 38–126)
Anion gap: 19 — ABNORMAL HIGH (ref 5–15)
BUN: 72 mg/dL — ABNORMAL HIGH (ref 8–23)
CO2: 25 mmol/L (ref 22–32)
Calcium: 8.5 mg/dL — ABNORMAL LOW (ref 8.9–10.3)
Chloride: 100 mmol/L (ref 98–111)
Creatinine, Ser: 2.87 mg/dL — ABNORMAL HIGH (ref 0.61–1.24)
GFR, Estimated: 21 mL/min — ABNORMAL LOW (ref >60)
Glucose, Bld: 125 mg/dL — ABNORMAL HIGH (ref 70–99)
Potassium: 3.6 mmol/L (ref 3.5–5.1)
Sodium: 144 mmol/L (ref 135–145)
Total Bilirubin: 1 mg/dL (ref 0.0–1.2)
Total Protein: 8.3 g/dL — ABNORMAL HIGH (ref 6.5–8.1)

## 2024-10-15 LAB — BLOOD GAS, VENOUS: Patient temperature: 37

## 2024-10-15 LAB — URINALYSIS, W/ REFLEX TO CULTURE (INFECTION SUSPECTED)
Bilirubin Urine: NEGATIVE
Glucose, UA: NEGATIVE mg/dL
Ketones, ur: NEGATIVE mg/dL
Nitrite: NEGATIVE
Protein, ur: 100 mg/dL — AB
Specific Gravity, Urine: 1.019 (ref 1.005–1.030)
WBC, UA: >50 WBC/hpf (ref 0–5)
pH: 5 (ref 5.0–8.0)

## 2024-10-15 LAB — PRO BRAIN NATRIURETIC PEPTIDE: Pro Brain Natriuretic Peptide: >35000 pg/mL — ABNORMAL HIGH (ref <300.0)

## 2024-10-15 LAB — TROPONIN T, HIGH SENSITIVITY
Troponin T High Sensitivity: 124 ng/L (ref 0–19)
Troponin T High Sensitivity: 114 ng/L (ref 0–19)

## 2024-10-15 LAB — LACTIC ACID, PLASMA: Lactic Acid, Venous: 3.5 mmol/L (ref 0.5–1.9)

## 2024-10-15 MED ORDER — ASPIRIN 81 MG PO CHEW
324.0000 mg | CHEWABLE_TABLET | Freq: Once | ORAL | Status: AC
  Administered 2024-10-15: 324 mg via ORAL
  Filled 2024-10-15: qty 4

## 2024-10-15 MED ORDER — IPRATROPIUM-ALBUTEROL 0.5-2.5 (3) MG/3ML IN SOLN: 3.0000 mL | Freq: Once | RESPIRATORY_TRACT | Status: DC

## 2024-10-15 MED ORDER — FUROSEMIDE 10 MG/ML IJ SOLN
40.0000 mg | Freq: Once | INTRAMUSCULAR | Status: AC
  Administered 2024-10-15: 40 mg via INTRAVENOUS
  Filled 2024-10-15: qty 4

## 2024-10-15 MED ORDER — SODIUM CHLORIDE 0.9 % IV SOLN
2.0000 g | Freq: Once | INTRAVENOUS | Status: AC
  Administered 2024-10-15: 2 g via INTRAVENOUS
  Filled 2024-10-15: qty 20

## 2024-10-15 MED ORDER — MORPHINE SULFATE (PF) 2 MG/ML IV SOLN
2.0000 mg | Freq: Once | INTRAVENOUS | Status: AC
  Administered 2024-10-15: 2 mg via INTRAVENOUS
  Filled 2024-10-15: qty 1

## 2024-10-15 MED ORDER — IPRATROPIUM-ALBUTEROL 0.5-2.5 (3) MG/3ML IN SOLN: 6.0000 mL | Freq: Once | RESPIRATORY_TRACT | Status: AC

## 2024-10-15 MED ORDER — SODIUM CHLORIDE 0.9 % IV SOLN
500.0000 mg | Freq: Once | INTRAVENOUS | Status: AC
  Administered 2024-10-15: 500 mg via INTRAVENOUS
  Filled 2024-10-15: qty 5

## 2024-10-15 MED ORDER — LACTATED RINGERS IV SOLN: INTRAVENOUS | Status: DC

## 2024-10-15 MED ORDER — IPRATROPIUM-ALBUTEROL 0.5-2.5 (3) MG/3ML IN SOLN
RESPIRATORY_TRACT | Status: AC
  Administered 2024-10-15: 6 mL via RESPIRATORY_TRACT
  Filled 2024-10-15: qty 6

## 2024-10-15 NOTE — ED Notes (Signed)
 CCMD notified of pt placement on cardiac monitor.

## 2024-10-15 NOTE — ED Triage Notes (Addendum)
 Pt arrives from home via [facility].  C/O Respiratory distress, lethargy, Abdominal pain and distension.  Hx Renal Failure, CHF, no HX COPD.

## 2024-10-15 NOTE — ED Provider Notes (Signed)
 Patient accepted to hospitalist service by Dr. Hilma Dicky Anes, MD 10/16/24 0001

## 2024-10-15 NOTE — ED Notes (Signed)
 Notified EDP Mumma of pt critical lactic of 3.5 per lab.

## 2024-10-15 NOTE — ED Provider Notes (Addendum)
 [facility] Provider Note    Event Date/Time   First MD Initiated Contact with Patient 10/15/24 1927     (approximate)   History   Respiratory Distress   HPI  [name] is a 89 y.o. male past medical history significant for paroxysmal atrial fibrillation on Eliquis , 2 L nighttime home oxygen use, CAD with status post CABG, HFrE secondary to ischemic cardiomyopathy with an EF of 25%.,  History of heart block with pacemaker, CKD, recent GI bleed, presents to the emergency department with respiratory distress.  Patient presents to the emergency department with EMS from home.  History is provided by EMS.  States that he has not been feeling well for the past couple of days and has had worsening shortness of breath and respiratory issues.  Complaining of worsening altered mental status and family member stated that he was not acting his normal self.  States that he has a history of heart failure but no history of COPD.  Has been complaining of abdominal pain and abdominal distention.  Takes Eliquis  and has been compliant with his Eliquis .  On chart review was held from Eliquis  for a couple of days during her recent hospitalization for GI bleed and then it was resumed.  1 bedbug found on the patient, decontaminated while in the emergency department     Physical Exam   Triage Vital Signs: ED Triage Vitals  Encounter Vitals Group     BP 10/15/24 1933 (!) 140/108     Girls Systolic BP Percentile --      Girls Diastolic BP Percentile --      Boys Systolic BP Percentile --      Boys Diastolic BP Percentile --      Pulse --      Resp --      Temp --      Temp src --      SpO2 10/15/24 1931 97 %     Weight --      Height --      Head Circumference --      Peak Flow --      Pain Score --      Pain Loc --      Pain Education --      Exclude from Growth Chart --     Most recent vital signs: Vitals:   10/15/24 2005 10/15/24 2013  BP:    Pulse: 72    Resp:    Temp:  (!) 97.3 F (36.3 C)  SpO2:      Physical Exam Constitutional:      Appearance: He is well-developed. He is ill-appearing.  HENT:     Head: Atraumatic.  Eyes:     Extraocular Movements: Extraocular movements intact.     Conjunctiva/sclera: Conjunctivae normal.     Pupils: Pupils are equal, round, and reactive to light.  Cardiovascular:     Rate and Rhythm: Regular rhythm.     Pulses: Normal pulses.  Pulmonary:     Effort: Respiratory distress present.     Breath sounds: Wheezing and rhonchi present.     Comments: Hypoxic to 85%, tachypneic, placed on 4 L nasal cannula with improvement to 96% Abdominal:     General: There is distension.     Tenderness: There is abdominal tenderness (Mild diffuse abdominal tenderness to palpation with no rebound or guarding).  Musculoskeletal:     Cervical back: Normal range of motion.     Right lower leg: Edema  present.     Left lower leg: Edema present.  Skin:    General: Skin is warm.     Capillary Refill: Capillary refill takes less than 2 seconds.  Neurological:     General: No focal deficit present.     Mental Status: He is alert. Mental status is at baseline. He is disoriented.  Psychiatric:        Mood and Affect: Mood normal.     IMPRESSION / MDM / ASSESSMENT AND PLAN / ED COURSE  I reviewed the triage vital signs and the nursing notes.  On arrival patient rectal temperature 97.3, hypoxic on room air requiring 4 L nasal cannula, hypertensive.  Differential diagnosis including CHF exacerbation, pneumonia, sepsis, viral illness including COVID/influenza, hypercarbia, ACS, bowel obstruction, urinary tract infection  Blood cultures obtained.  Wheezing on exam, will do a trial of DuoNeb treatment   EKG  I, Clotilda Punter, the attending physician, personally viewed and interpreted this ECG.  EKG showed atrial flutter however I do not see any flutter waves present.  Underlying left bundle branch block.  No  significant ST elevation or depression.  No tachycardic or bradycardic dysrhythmias while on cardiac telemetry.  RADIOLOGY I independently reviewed imaging, my interpretation of imaging: Chest x-ray with questionable left lower lobe airspace disease infection versus aspiration with a small left effusion  Ordered CT scan head, CT scan chest abdomen pelvis without contrast given low GFR at baseline  CT scan of the head with no acute findings  LABS (all labs ordered are listed, but only abnormal results are displayed) Labs interpreted as -    Labs Reviewed  CBC WITH DIFFERENTIAL/PLATELET - Abnormal; Notable for the following components:      Result Value   RDW 16.0 (*)    Platelets 116 (*)    All other components within normal limits  COMPREHENSIVE METABOLIC PANEL WITH GFR - Abnormal; Notable for the following components:   Glucose, Bld 125 (*)    BUN 72 (*)    Creatinine, Ser 2.87 (*)    Calcium  8.5 (*)    Total Protein 8.3 (*)    GFR, Estimated 21 (*)    Anion gap 19 (*)    All other components within normal limits  BLOOD GAS, VENOUS - Abnormal; Notable for the following components:   Bicarbonate 32.2 (*)    Acid-Base Excess 5.1 (*)    All other components within normal limits  PRO BRAIN NATRIURETIC PEPTIDE - Abnormal; Notable for the following components:   Pro Brain Natriuretic Peptide >35,000.0 (*)    All other components within normal limits  LACTIC ACID, PLASMA - Abnormal; Notable for the following components:   Lactic Acid, Venous 3.5 (*)    All other components within normal limits  URINALYSIS, W/ REFLEX TO CULTURE (INFECTION SUSPECTED) - Abnormal; Notable for the following components:   Color, Urine AMBER (*)    APPearance CLOUDY (*)    Hgb urine dipstick MODERATE (*)    Protein, ur 100 (*)    Leukocytes,Ua LARGE (*)    Bacteria, UA MANY (*)    All other components within normal limits  TROPONIN T, HIGH SENSITIVITY - Abnormal; Notable for the following  components:   Troponin T High Sensitivity 124 (*)    All other components within normal limits  TROPONIN T, HIGH SENSITIVITY - Abnormal; Notable for the following components:   Troponin T High Sensitivity 114 (*)    All other components within normal limits  RESP PANEL  BY RT-PCR (RSV, FLU A&B, COVID)  RVPGX2  CULTURE, BLOOD (ROUTINE X 2)  CULTURE, BLOOD (ROUTINE X 2)  URINE CULTURE  LACTIC ACID, PLASMA     MDM  Blood cultures obtained.  Given concern for left lower lobe pneumonia started on antibiotics to cover community-acquired pneumonia.  No significant leukocytosis, no anemia and hemoglobin is stable at 15.9.  Platelets chronically low at 116.  Lactic acid elevated at 3.5.  Creatinine appears to be at his baseline.  BUN chronically elevated.  Does have an elevated anion gap of 19 but his CO2 is normal at 25, likely in the setting of his elevated BUN and his lactic acid  Findings concerning for urinary tract infection, urine was sent for culture.  Prior urine cultures have been sensitive to Rocephin   Troponin elevated at 124, proBNP significantly elevated at greater than 35,000.  Patient is having active chest pain.  Has been taking his Eliquis  and last took his Eliquis  dose this morning.  Given aspirin .  Given IV morphine  for pain control.  Will hold on IV heparin  until repeat troponin given his recent GI bleed and that he has been on Eliquis .  On my evaluation of CT scan concerning for pulmonary edema with pleural effusions and appears to have some edema in his abdomen.  Largest right sided renal cyst versus hydro, bladder wall thickening.  Read currently pending.  11:25 PM second troponin is negative.  CT scan with circumferential bladder wall thickening concerning for infection given his urinary tract findings on his UA.  Rectal wall thickening concerning for either perirectal edema versus inflammatory proctitis.  Mild ascites.  Body wall edema new from prior.  Enlarging bilateral  pleural effusions and compressive atelectasis.  Diverticulosis but no signs of acute diverticulitis.  Second troponin downtrending  Plan for admission for sepsis, respiratory distress, acute hypoxia and concerns for urinary tract infection     PROCEDURES:  Critical Care performed: yes  .Critical Care  Performed by: Suzanne Kirsch, MD Authorized by: Suzanne Kirsch, MD   Critical care provider statement:    Critical care time (minutes):  45   Critical care time was exclusive of:  Separately billable procedures and treating other patients   Critical care was necessary to treat or prevent imminent or life-threatening deterioration of the following conditions:  Respiratory failure and sepsis   Critical care was time spent personally by me on the following activities:  Development of treatment plan with patient or surrogate, discussions with consultants, evaluation of patient's response to treatment, examination of patient, ordering and review of laboratory studies, ordering and review of radiographic studies, ordering and performing treatments and interventions, pulse oximetry, re-evaluation of patient's condition and review of old charts   Care discussed with: admitting provider     Patient's presentation is most consistent with acute presentation with potential threat to life or bodily function.   MEDICATIONS ORDERED IN ED: Medications  lactated ringers  infusion ( Intravenous New Bag/Given 10/15/24 2254)  furosemide  (LASIX ) injection 40 mg (has no administration in time range)  ipratropium-albuterol  (DUONEB) 0.5-2.5 (3) MG/3ML nebulizer solution 6 mL (6 mLs Nebulization Given 10/15/24 1940)  cefTRIAXone  (ROCEPHIN ) 2 g in sodium chloride  0.9 % 100 mL IVPB (0 g Intravenous Stopped 10/15/24 2123)  azithromycin  (ZITHROMAX ) 500 mg in sodium chloride  0.9 % 250 mL IVPB (0 mg Intravenous Stopped 10/15/24 2202)  aspirin  chewable tablet 324 mg (324 mg Oral Given 10/15/24 2158)  morphine  (PF) 2 MG/ML  injection 2 mg (2 mg Intravenous Given  10/15/24 2159)    FINAL CLINICAL IMPRESSION(S) / ED DIAGNOSES   Final diagnoses:  Respiratory distress  Hypoxia  Acute cystitis with hematuria  Elevated troponin  Acute on chronic systolic congestive heart failure (HCC)     Rx / DC Orders   ED Discharge Orders     None        Note:  This document was prepared using Dragon voice recognition software and may include unintentional dictation errors.   Suzanne Kirsch, MD 10/15/24 7741    Suzanne Kirsch, MD 10/15/24 2325

## 2024-10-15 NOTE — Sepsis Progress Note (Signed)
 Elink monitoring for the code sepsis protocol.

## 2024-10-15 NOTE — Consult Note (Signed)
 CODE SEPSIS - PHARMACY COMMUNICATION  **Broad Spectrum Antibiotics should be administered within 1 hour of Sepsis diagnosis**  Time Code Sepsis Called/Page Received: 2011  Antibiotics Ordered: Rocephin  x1, Azithromycin  x1  Time of 1st antibiotic administration: 2028  Additional action taken by pharmacy: none  If necessary, Name of Provider/Nurse Contacted: n/a    Annabella LOISE Banks ,PharmD Clinical Pharmacist  10/15/2024  8:33 PM

## 2024-10-15 NOTE — Telephone Encounter (Signed)
 Called to confirm/remind patient of their appointment [facility] on 10/16/[facility].   Appointment:   [] Confirmed  [x] Left mess   [] No answer/No voice mail  [] VM Full/unable to leave message  [] Phone not in service  Patient reminded to bring all medications and/or complete list.  Confirmed patient has transportation. Gave directions, instructed to utilize valet parking.

## 2024-10-15 NOTE — ED Notes (Signed)
 EDP Mumma notified of pt chest pain.

## 2024-10-15 NOTE — ED Notes (Signed)
 Notified EDP Mumma of pt critical troponin of 124.

## 2024-10-15 NOTE — H&P (Signed)
 History and Physical    [name] FMW:[id number] DOB: 14-Dec-1934 DOA: 10/15/2024  Referring MD/NP/PA:   PCP: Duane Emmie FALCON, MD   Patient coming from:  The patient is coming from home.     Chief Complaint: SOB  HPI: [name] is a 89 y.o. male with medical history significant of PAF on Eliquis , sCHF with EF 35-40, HTN, HLD, DM, GERD, CKD-4, CAD, s/p of stent, CABG, cardiac arrest, complete heart block, s/p of pacemaker, GIB, who presents with shortness of breath.   Pt states that he has not been feeling welling well in the past several days.  He has dry cough, SOB which has been progressively worsening.  Denies chest pain, fever or chills.  Patient does not use oxygen at baseline, but was found to have oxygen desaturation to 85% on room air, cannot speak in full sentence, which improved to 97% on 6 L oxygen, then went down to 4L oxygen.  Patient reports lower abdominal pain, which is constant, aching, moderate, nonradiating.  No nausea vomiting or diarrhea.  He reports burning on urination, denies dysuria or hematuria.  He has bilateral leg edema. Pt is lethargic, but easily arousable.  Patient is orientated x 3.  He moves all extremities normally.  Of note, patient was recently hospitalized from 11/9 - 11/10 due to GI bleeding.  His Eliquis  was on hold, which was resumed already.  He took last dose of Eliquis  this morning per patient.  Data reviewed independently and ED Course: pt was found to have pro-BNP > 35,000, rop 124 --> 114, lactic acid 3.5, WBC 5.6, renal functions worse than baseline, negative PCR for COVID, flu and RSV, UA (cloudy appearance, large amount of leukocyte, moderate amount of leukocyte, WBC> 50).  Temperature 97.3, blood pressure 128/104, heart rate 72, RR 14.  VBG with pH 7.36, CO2 57.  CT of head negative for acute intracranial abnormalities.  Patient is admitted to PCU as inpatient.   Chest x-ray: 1. Left lower lobe airspace disease which may reflect  infection or aspiration. 2. Small left effusion.    CT chest/abdomen/pelvis: 1. Circumferential bladder wall thickening with mild perivesicular inflammatory stranding, possibly related to diffuse infectious or inflammatory cystitis. Correlation with urinalysis and urine culture may be helpful for further management. 2. Circumferential rectal wall thickening with perirectal edema, possibly related to infectious or inflammatory proctitis. 3. Mild ascites, and extensive retroperitoneal and body wall edema, new from prior examination. Small but enlarging bilateral pleural effusions with bibasilar compressive atelectasis. 4. Mild-to-moderate cardiomegaly with prior coronary artery bypass grafting and left subclavian dual-lead pacemaker in place. 5. Severe descending and sigmoid colonic diverticulosis without definite acute inflammatory change. 6. Raf score: Aortic atherosclerosis (icd10-i70.0).   EKG: I have personally reviewed.  Seem to be flutter, QTc 486, early application, low voltage.   Review of Systems:   General: no fevers, chills, no body weight gain, has poor appetite, has fatigue HEENT: no blurry vision, hearing changes or sore throat Respiratory: has dyspnea, coughing, no wheezing CV: no chest pain, no palpitations GI: no nausea, vomiting, has lower abdominal pain, no diarrhea, constipation GU: no dysuria, has burning on urination, no increased urinary frequency, hematuria  Ext: has leg edema Neuro: no unilateral weakness, numbness, or tingling, no vision change or hearing loss.  Has lethargy Skin: no rash, no skin tear. MSK: No muscle spasm, no deformity, no limitation of range of movement in spin Heme: No easy bruising.  Travel history: No recent long distant travel.  Allergy:  Allergies  Allergen Reactions   Angiotensin Receptor Blockers     hyperkalemia   Metformin Diarrhea    Past Medical History:  Diagnosis Date   AV block, Mobitz 1    CAD (coronary  artery disease)    a. 08/2019 VF arrest-->sev 3vd on cath-->CABGx5 (LIMA->LAD, VG->OM->LCX, VG->RPDA, VG->Diag); b. 08/2021 Cath: sev native multivessel dzs w/ 5/5 patent grafts. Nl filling pressures->Med rx; c. 05/2023 Cath: stable anatomy->Med rx.   CKD (chronic kidney disease), stage III (HCC)    Diabetes mellitus without complication (HCC)    HFrEF (heart failure with reduced ejection fraction) (HCC)    a. 08/2020 Echo: EF 40-45%; b. 11/2020 Echo: EF 55%, Gr2 DD; c. 08/2021 Echo: EF 35-40%, glob HK w/ ant/antsept/apical HK; d. 10/2021 s/p CRT-P; e. 12/2021 Echo: EF 30-35%, glob HK, GrIII DD; f. 12/2022 Echo: EF 30-35%, glob HK, sev HK of ant wall, mod asymm LVH, nl RV fxn, mildly dil LA, triv MR, AoV sclerosis.   Hyperlipidemia LDL goal <70    Hypertension    Intermittent Complete heart block (HCC)    a. 08/2021 noted on Zio; b. 10/2021 s/p Abbott Allure RF CRT-P (Ser # [id number]).   Ischemic cardiomyopathy    a. 08/2020 Echo: EF 40-45%; b. 11/2020 Echo: EF 55%; c. 08/2021 Echo: EF 35-40%; d. 10/2021 s/p CRT-P; e. 12/2021 Echo: EF 30-35%, glob HK, GrIII DD.   PAF (paroxysmal atrial fibrillation) (HCC)    a.Medical device check-->Eliquis  (CHA2DS2VASc = 6)   Trifascicular block     Past Surgical History:  Procedure Laterality Date   BACK SURGERY     CARDIAC CATHETERIZATION     CLIPPING OF ATRIAL APPENDAGE N/A 08/29/2020   Procedure: CLIPPING OF ATRIAL APPENDAGE USING ATRICURE 45 MM ATRICLIP FLEX-V;  Surgeon: Army Dallas NOVAK, MD;  Location: [facility] OR;  Service: Open Heart Surgery;  Laterality: N/A;   CORONARY ARTERY BYPASS GRAFT N/A 08/29/2020   Procedure: CORONARY ARTERY BYPASS GRAFTING (CABG) X 5 USING LEFT INTERNAL MAMMARY ARTERY AND ENDOSCOPICALLY HARVESTED RIGHT GREATER SAPHENOUS VEIN. LIMA TO LAD, SVG TO OM SEQ TO CIRC, SVG TO PD, SVG TO DIAG.;  Surgeon: Army Dallas NOVAK, MD;  Location: [facility] OR;  Service: Open Heart Surgery;  Laterality: N/A;   ENDOVEIN HARVEST OF GREATER SAPHENOUS VEIN Right  08/29/2020   Procedure: ENDOVEIN HARVEST OF GREATER SAPHENOUS VEIN;  Surgeon: Army Dallas NOVAK, MD;  Location: [facility] OR;  Service: Open Heart Surgery;  Laterality: Right;   HERNIA REPAIR     LEFT HEART CATH AND CORONARY ANGIOGRAPHY N/A 08/25/2020   Procedure: LEFT HEART CATH AND CORONARY ANGIOGRAPHY;  Surgeon: Darron Deatrice LABOR, MD;  Location: [facility] INVASIVE CV LAB;  Service: Cardiovascular;  Laterality: N/A;   PACEMAKER IMPLANT N/A 10/12/2021   Procedure: PACEMAKER IMPLANT;  Surgeon: Cindie Ole DASEN, MD;  Location: [facility] INVASIVE CV LAB;  Service: Cardiovascular;  Laterality: N/A;   RIGHT/LEFT HEART CATH AND CORONARY ANGIOGRAPHY N/A 08/31/2021   Procedure: RIGHT/LEFT HEART CATH AND CORONARY ANGIOGRAPHY;  Surgeon: Darron Deatrice LABOR, MD;  Location: [facility] INVASIVE CV LAB;  Service: Cardiovascular;  Laterality: N/A;   RIGHT/LEFT HEART CATH AND CORONARY/GRAFT ANGIOGRAPHY N/A 06/06/2023   Procedure: RIGHT/LEFT HEART CATH AND CORONARY/GRAFT ANGIOGRAPHY;  Surgeon: Darron Deatrice LABOR, MD;  Location: [facility] INVASIVE CV LAB;  Service: Cardiovascular;  Laterality: N/A;   TEE WITHOUT CARDIOVERSION N/A 08/29/2020   Procedure: TRANSESOPHAGEAL ECHOCARDIOGRAM (TEE);  Surgeon: Army Dallas NOVAK, MD;  Location: [facility] OR;  Service: Open Heart Surgery;  Laterality: N/A;  Social History:  reports that he quit smoking about 28 years ago. His smoking use included cigarettes. He has never used smokeless tobacco. He reports that he does not currently use alcohol. He reports that he does not use drugs.  Family History:  Family History  Problem Relation Age of Onset   Heart attack Mother      Prior to Admission medications   Medication Sig Start Date End Date Taking? Authorizing Provider  acetaminophen  (TYLENOL ) 500 MG tablet Take 1,000 mg by mouth every 6 (six) hours as needed for mild pain, fever or headache.    [provider]  acyclovir  (ZOVIRAX ) 800 MG tablet Take 800 mg by mouth daily.    [provider]  apixaban  (ELIQUIS ) 2.5 MG TABS tablet TAKE 1 TABLET BY MOUTH TWICE A DAY 07/24/24   Terra Fairy PARAS, PA-C  carvedilol  (COREG ) 12.5 MG tablet Take 1 tablet (12.5 mg total) by mouth 2 (two) times daily with a meal. 10/09/24   Donette Ellouise LABOR, FNP  dapagliflozin  propanediol (FARXIGA ) 10 MG TABS tablet Take 1 tablet (10 mg total) by mouth daily. 12/27/23   Awanda Ellouise, MD  ezetimibe  (ZETIA ) 10 MG tablet TAKE 1 TABLET BY MOUTH EVERY DAY Patient not taking: Reported on 10/09/2024 06/29/24   Rolan Ezra RAMAN, MD  Insulin  Glargine (BASAGLAR  [facility]) 100 UNIT/ML Inject 15 Units into the skin at bedtime. 02/19/24 02/18/25  [provider]  magnesium  oxide (MAG-OX) 400 MG tablet Take 1 tablet (400 mg total) by mouth daily. Patient not taking: Reported on 10/09/2024 01/20/22   Cindie Ole DASEN, MD  metolazone  (ZAROXOLYN ) 2.5 MG tablet Take 2.5 mg by mouth 2 (two) times a week. Every Tuesday and Friday    [provider]  pantoprazole  (PROTONIX ) 40 MG tablet Take 1 tablet (40 mg total) by mouth 2 (two) times daily. 09/17/24   Amin, Sumayya, MD  polyethylene glycol (MIRALAX  / GLYCOLAX ) 17 g packet Take 17 g by mouth daily as needed for moderate constipation. 12/19/22   Josette Ade, MD  potassium chloride  SA (KLOR-CON  M) 20 MEQ tablet Take 2 tablets (40 mEq total) by mouth daily. 10/09/24   Donette Ellouise LABOR, FNP  rosuvastatin  (CRESTOR ) 40 MG tablet Take 1 tablet (40 mg total) by mouth daily. 05/25/23   Gerard Frederick, NP  sacubitril -valsartan  (ENTRESTO ) 49-51 MG Take 1 tablet by mouth 2 (two) times daily. 10/09/24   Donette Ellouise LABOR, FNP  spironolactone  (ALDACTONE ) 25 MG tablet Take 1 tablet (25 mg total) by mouth daily. 04/19/24   Donette Ellouise LABOR, FNP  tamsulosin  (FLOMAX ) 0.4 MG CAPS capsule Take 0.4 mg by mouth daily.    [provider]  torsemide  (DEMADEX ) 20 MG tablet Take 40-60 mg by mouth daily. Alternating days 40 mg and 60 mg 07/11/24   [provider]    Physical  Exam: Vitals:   10/15/24 2013 10/15/24 2300 10/16/24 0000 10/16/24 0044  BP:  (!) 128/104 (!) 152/111   Pulse:  70 70   Resp:  14 13   Temp: (!) 97.3 F (36.3 C)   (!) 97.5 F (36.4 C)  TempSrc: Rectal   Oral  SpO2:  100% 100%   Weight:      Height:       General: has acute respiratory distress HEENT:       Eyes: PERRL, EOMI, no jaundice       ENT: No discharge from the ears and nose, no pharynx injection, no tonsillar enlargement.  Neck: Difficult to assess JVD due to obesity, no bruit, no mass felt. Heme: No neck lymph node enlargement. Cardiac: S1/S2, RRR, No murmurs, No gallops or rubs. Respiratory: Has crackles bilaterally GI: Soft, nondistended, has tenderness in lower abdomen, no rebound pain, no organomegaly, BS present. GU: No hematuria Ext: 2+ pitting leg edema bilaterally. 1+DP/PT pulse bilaterally. Musculoskeletal: No joint deformities, No joint redness or warmth, no limitation of ROM in spin. Skin: No rashes.  Neuro: Patient is lethargic, easily arousable, oriented X3, cranial nerves II-XII grossly intact, moves all extremities Psych: Patient is not psychotic, no suicidal or hemocidal ideation.  Labs on Admission: I have personally reviewed following labs and imaging studies  CBC: Recent Labs  Lab 10/10/24 1058 10/15/24 1935  WBC 4.3 5.6  NEUTROABS  --  4.0  HGB 14.2 15.9  HCT 45.1 50.4  MCV 88.6 89.0  PLT 107* 116*   Basic Metabolic Panel: Recent Labs  Lab 10/10/24 1058 10/15/24 1935  NA 142 144  K 3.9 3.6  CL 105 100  CO2 24 25  GLUCOSE 122* 125*  BUN 67* 72*  CREATININE 2.83* 2.87*  CALCIUM  8.0* 8.5*   GFR: Estimated Creatinine Clearance: 20.7 mL/min (A) (by C-G formula based on SCr of 2.87 mg/dL (H)). Liver Function Tests: Recent Labs  Lab 10/15/24 1935  AST 29  ALT 8  ALKPHOS 85  BILITOT 1.0  PROT 8.3*  ALBUMIN  4.4   No results for input(s): LIPASE, AMYLASE in the last 168 hours. No results for input(s): AMMONIA in  the last 168 hours. Coagulation Profile: No results for input(s): INR, PROTIME in the last 168 hours. Cardiac Enzymes: No results for input(s): CKTOTAL, CKMB, CKMBINDEX, TROPONINI in the last 168 hours. BNP (last 3 results) Recent Labs    10/03/24 1202 10/10/24 1058 10/15/24 1935  PROBNP 20,348* 24,507.0* >35,000.0*   HbA1C: No results for input(s): HGBA1C in the last 72 hours. CBG: Recent Labs  Lab 10/16/24 0050  GLUCAP 117*   Lipid Profile: No results for input(s): CHOL, HDL, LDLCALC, TRIG, CHOLHDL, LDLDIRECT in the last 72 hours. Thyroid Function Tests: No results for input(s): TSH, T4TOTAL, FREET4, T3FREE, THYROIDAB in the last 72 hours. Anemia Panel: No results for input(s): VITAMINB12, FOLATE, FERRITIN, TIBC, IRON, RETICCTPCT in the last 72 hours. Urine analysis:    Component Value Date/Time   COLORURINE AMBER (A) 10/15/2024 2129   APPEARANCEUR CLOUDY (A) 10/15/2024 2129   LABSPEC 1.019 10/15/2024 2129   PHURINE 5.0 10/15/2024 2129   GLUCOSEU NEGATIVE 10/15/2024 2129   HGBUR MODERATE (A) 10/15/2024 2129   BILIRUBINUR NEGATIVE 10/15/2024 2129   KETONESUR NEGATIVE 10/15/2024 2129   PROTEINUR 100 (A) 10/15/2024 2129   NITRITE NEGATIVE 10/15/2024 2129   LEUKOCYTESUR LARGE (A) 10/15/2024 2129   Sepsis Labs: @LABRCNTIP (procalcitonin:4,lacticidven:4) ) Recent Results (from the past 240 hours)  Resp panel by RT-PCR (RSV, Flu A&B, Covid) Anterior Nasal Swab     Status: None   Collection Time: 10/15/24  7:35 PM   Specimen: Anterior Nasal Swab  Result Value Ref Range Status   SARS Coronavirus 2 by RT PCR NEGATIVE NEGATIVE Final    Comment: (NOTE) SARS-CoV-2 target nucleic acids are NOT DETECTED.  The SARS-CoV-2 RNA is generally detectable in upper respiratory specimens during the acute phase of infection. The lowest concentration of SARS-CoV-2 viral copies this assay can detect is 138 copies/mL. A negative result does  not preclude SARS-Cov-2 infection and should not be used as the sole basis for treatment or other patient management  decisions. A negative result may occur with  improper specimen collection/handling, submission of specimen other than nasopharyngeal swab, presence of viral mutation(s) within the areas targeted by this assay, and inadequate number of viral copies(<138 copies/mL). A negative result must be combined with clinical observations, patient history, and epidemiological information. The expected result is Negative.  Fact Sheet for Patients:  bloggercourse.com  Fact Sheet for Healthcare Providers:  seriousbroker.it  This test is no t yet approved or cleared by the [place]  FDA and  has been authorized for detection and/or diagnosis of SARS-CoV-2 by FDA under an Emergency Use Authorization (EUA). This EUA will remain  in effect (meaning this test can be used) for the duration of the COVID-19 declaration under Section 564(b)(1) of the Act, 21 U.S.C.section 360bbb-3(b)(1), unless the authorization is terminated  or revoked sooner.       Influenza A by PCR NEGATIVE NEGATIVE Final   Influenza B by PCR NEGATIVE NEGATIVE Final    Comment: (NOTE) The Xpert Xpress SARS-CoV-2/FLU/RSV plus assay is intended as an aid in the diagnosis of influenza from Nasopharyngeal swab specimens and should not be used as a sole basis for treatment. Nasal washings and aspirates are unacceptable for Xpert Xpress SARS-CoV-2/FLU/RSV testing.  Fact Sheet for Patients: bloggercourse.com  Fact Sheet for Healthcare Providers: seriousbroker.it  This test is not yet approved or cleared by the [place]  FDA and has been authorized for detection and/or diagnosis of SARS-CoV-2 by FDA under an Emergency Use Authorization (EUA). This EUA will remain in effect (meaning this test can be used) for the  duration of the COVID-19 declaration under Section 564(b)(1) of the Act, 21 U.S.C. section 360bbb-3(b)(1), unless the authorization is terminated or revoked.     Resp Syncytial Virus by PCR NEGATIVE NEGATIVE Final    Comment: (NOTE) Fact Sheet for Patients: bloggercourse.com  Fact Sheet for Healthcare Providers: seriousbroker.it  This test is not yet approved or cleared by the [place]  FDA and has been authorized for detection and/or diagnosis of SARS-CoV-2 by FDA under an Emergency Use Authorization (EUA). This EUA will remain in effect (meaning this test can be used) for the duration of the COVID-19 declaration under Section 564(b)(1) of the Act, 21 U.S.C. section 360bbb-3(b)(1), unless the authorization is terminated or revoked.  Performed at [facility], [address] [place], [place] [address]      Radiological Exams on Admission:   Assessment/Plan Principal Problem:   Acute on chronic systolic CHF (congestive heart failure) (HCC) Active Problems:   Acute respiratory failure with hypoxia (HCC)   UTI (urinary tract infection)   Proctitis   CAD, s/p CABG x 5   Myocardial injury   HTN (hypertension)   HLD (hyperlipidemia)   Type II diabetes mellitus with renal manifestations (HCC)   Paroxysmal atrial fibrillation (HCC)   Elevated lactic acid level   Thrombocytopenia   CKD (chronic kidney disease), stage IV (HCC)   Hypothermia   Obesity (BMI 30-39.9)   Assessment and Plan:  Acute respiratory failure with hypoxia due to acute on chronic systolic CHF: pt has leg edema, SOB, significantly elevated BNP> 35,000, clinically consistent with CHF exacerbation.  2D echo on 08/17/2024 showed EF of 25-30%.  Patient has 4 L of new oxygen requirement now.  -Will admit to PCU as inpatient -Lasix  40 mg bid by IV -Continue home Entresto  and spironolactone  -Daily weights -strict I/O's -Low salt diet -Fluid  restriction -As needed bronchodilators for shortness of breath  UTI: - On Rocephin  -  Follow-up for urine culture  Proctitis: CT showed circumferential rectal wall thickening with perirectal edema, possibly related to infectious or inflammatory proctitis. - IV Rocephin  and Flagyl  - Follow-up of blood culture  Type II diabetes mellitus with renal manifestations [facility]): Recent A1c 7.3, poorly controlled.  Patient taking Farxiga , glargine insulin  15 units daily -SSI -Glargine insulin  10 units daily   Dyslipidemia -Crestor  and zetia    Paroxysmal atrial fibrillation (HCC): HR 70s -Eliquis  and Coreg    CKD-4: Patient's baseline creatinine 1.9-2.3, but recently patient had elevated creatinine to 2.83 on 10/10/2024.  Today's creatinine is 2.87, BUN 72, GFR 21.  His renal function is worsening than baseline, but close to recent creatinine level. -Follow-up with BMP  CAD, s/p CABG x 5 and Myocardial injury: trop  124 --> 114, no chest pain, likely due to demand ischemia. - Trend troponin - Continue Zetia  and Crestor   HTN (hypertension): -Entresto , coreg  -Also on spironolactone , Lasix  -prn hydralazine  IV  Elevated lactic acid level: Lactic acid 3.5.  No fever or leukocytosis, does not seem to have sepsis.  Possibly due to hypoxia. - Trend lactic acid level - Will not give IV fluids due to CHF exacerbation  Thrombocytopenia: Platelet 116, this is chronic issue.  No active bleeding. -Follow-up with CBC  Hypothermia -Bair hugger  Obesity (BMI 30-39.9): Patient has Obesity Class I, with body weight 99.1 Kg and BMI 33.21 kg/m2.  - Encourage losing weight - Exercise and healthy diet          DVT ppx: on Eliquis   Code Status: Full code   Family Communication:     not done, no family member is at bed side.     Disposition Plan:  Anticipate discharge back to previous environment  Consults called:  none  Admission status and Level of care: Progressive:    for obs as inpt         Dispo: The patient is from: Home              Anticipated d/c is to: Home              Anticipated d/c date is: 2 days              Patient currently is not medically stable to d/c.    Severity of Illness:  The appropriate patient status for this patient is INPATIENT. Inpatient status is judged to be reasonable and necessary in order to provide the required intensity of service to ensure the patient's safety. The patient's presenting symptoms, physical exam findings, and initial radiographic and laboratory data in the context of their chronic comorbidities is felt to place them at high risk for further clinical deterioration. Furthermore, it is not anticipated that the patient will be medically stable for discharge from the hospital within 2 midnights of admission.   * I certify that at the point of admission it is my clinical judgment that the patient will require inpatient hospital care spanning beyond 2 midnights from the point of admission due to high intensity of service, high risk for further deterioration and high frequency of surveillance required.*       Date of Service 10/16/2024    [facility]   If 7PM-7AM, please contact night-coverage [email or web address] 10/16/2024, 1:28 AM
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
## 2024-10-15 NOTE — ED Notes (Signed)
 Patient transported to CT

## 2024-10-16 ENCOUNTER — Other Ambulatory Visit: Payer: Self-pay

## 2024-10-16 ENCOUNTER — Ambulatory Visit: Admitting: Family

## 2024-10-16 DIAGNOSIS — I5A Non-ischemic myocardial injury (non-traumatic): Secondary | ICD-10-CM

## 2024-10-16 DIAGNOSIS — K6289 Other specified diseases of anus and rectum: Secondary | ICD-10-CM

## 2024-10-16 DIAGNOSIS — T68XXXA Hypothermia, initial encounter: Secondary | ICD-10-CM | POA: Diagnosis present

## 2024-10-16 DIAGNOSIS — R7989 Other specified abnormal findings of blood chemistry: Secondary | ICD-10-CM | POA: Diagnosis present

## 2024-10-16 DIAGNOSIS — N390 Urinary tract infection, site not specified: Secondary | ICD-10-CM | POA: Diagnosis present

## 2024-10-16 HISTORY — DX: Other specified diseases of anus and rectum: K62.89

## 2024-10-16 HISTORY — DX: Other specified abnormal findings of blood chemistry: R79.89

## 2024-10-16 HISTORY — DX: Hypothermia, initial encounter: T68.XXXA

## 2024-10-16 HISTORY — DX: Non-ischemic myocardial injury (non-traumatic): I5A

## 2024-10-16 LAB — BLOOD GAS, VENOUS
Bicarbonate: 32.2 mmol/L — ABNORMAL HIGH (ref 20.0–28.0)
Patient temperature: 37 mmol/L — AB (ref 0.0–2.0)
pCO2, Ven: 57 mmHg (ref 44–60)
pH, Ven: 7.36 (ref 7.25–7.43)
pO2, Ven: 32.2 mmHg — AB (ref 32–45)

## 2024-10-16 LAB — TROPONIN T, HIGH SENSITIVITY
Troponin T High Sensitivity: 109 ng/L (ref 0–19)
Troponin T High Sensitivity: 82 ng/L — ABNORMAL HIGH (ref 0–19)
Troponin T High Sensitivity: 101 ng/L (ref 0–19)

## 2024-10-16 LAB — GLUCOSE, CAPILLARY: Glucose-Capillary: 144 mg/dL — ABNORMAL HIGH (ref 70–99)

## 2024-10-16 LAB — CBG MONITORING, ED
Glucose-Capillary: 102 mg/dL — ABNORMAL HIGH (ref 70–99)
Glucose-Capillary: 109 mg/dL — ABNORMAL HIGH (ref 70–99)
Glucose-Capillary: 117 mg/dL — ABNORMAL HIGH (ref 70–99)
Glucose-Capillary: 156 mg/dL — ABNORMAL HIGH (ref 70–99)
Glucose-Capillary: 97 mg/dL (ref 70–99)

## 2024-10-16 LAB — LACTIC ACID, PLASMA
Lactic Acid, Venous: 2.3 mmol/L (ref 0.5–1.9)
Lactic Acid, Venous: 2.8 mmol/L (ref 0.5–1.9)
Lactic Acid, Venous: 3.3 mmol/L (ref 0.5–1.9)

## 2024-10-16 MED ORDER — CARVEDILOL 12.5 MG PO TABS
12.5000 mg | ORAL_TABLET | Freq: Two times a day (BID) | ORAL | Status: DC
  Administered 2024-10-16 – 2024-10-23 (×15): 12.5 mg via ORAL
  Filled 2024-10-16 (×10): qty 1
  Filled 2024-10-16: qty 2
  Filled 2024-10-16: qty 1
  Filled 2024-10-16: qty 2
  Filled 2024-10-16 (×2): qty 1

## 2024-10-16 MED ORDER — TAMSULOSIN HCL 0.4 MG PO CAPS
0.4000 mg | ORAL_CAPSULE | Freq: Every day | ORAL | Status: DC
  Administered 2024-10-16 – 2024-10-23 (×8): 0.4 mg via ORAL
  Filled 2024-10-16 (×8): qty 1

## 2024-10-16 MED ORDER — HYDRALAZINE HCL 20 MG/ML IJ SOLN: 5.0000 mg | INTRAMUSCULAR | Status: DC | PRN

## 2024-10-16 MED ORDER — ALBUTEROL SULFATE (2.5 MG/3ML) 0.083% IN NEBU
2.5000 mg | INHALATION_SOLUTION | RESPIRATORY_TRACT | Status: DC | PRN
  Administered 2024-10-18 (×2): 2.5 mg via RESPIRATORY_TRACT
  Filled 2024-10-16 (×2): qty 3

## 2024-10-16 MED ORDER — DAPAGLIFLOZIN PROPANEDIOL 10 MG PO TABS: 10.0000 mg | ORAL_TABLET | Freq: Every day | ORAL | Status: DC

## 2024-10-16 MED ORDER — INSULIN ASPART 100 UNIT/ML IJ SOLN: 0.0000 [IU] | Freq: Every day | INTRAMUSCULAR | Status: DC

## 2024-10-16 MED ORDER — BASAGLAR KWIKPEN 100 UNIT/ML ~~LOC~~ SOPN: 10.0000 [IU] | PEN_INJECTOR | Freq: Every day | SUBCUTANEOUS | Status: DC

## 2024-10-16 MED ORDER — ONDANSETRON HCL 4 MG/2ML IJ SOLN: 4.0000 mg | Freq: Three times a day (TID) | INTRAMUSCULAR | Status: DC | PRN

## 2024-10-16 MED ORDER — SACUBITRIL-VALSARTAN 49-51 MG PO TABS
1.0000 | ORAL_TABLET | Freq: Two times a day (BID) | ORAL | Status: DC
  Administered 2024-10-16 – 2024-10-23 (×15): 1 via ORAL
  Filled 2024-10-16 (×15): qty 1

## 2024-10-16 MED ORDER — METRONIDAZOLE 500 MG/100ML IV SOLN
500.0000 mg | Freq: Two times a day (BID) | INTRAVENOUS | Status: DC
  Administered 2024-10-16 – 2024-10-17 (×3): 500 mg via INTRAVENOUS
  Filled 2024-10-16 (×4): qty 100

## 2024-10-16 MED ORDER — INSULIN GLARGINE 100 UNIT/ML ~~LOC~~ SOLN
10.0000 [IU] | Freq: Every day | SUBCUTANEOUS | Status: DC
  Administered 2024-10-16: 10 [IU] via SUBCUTANEOUS
  Filled 2024-10-16 (×3): qty 0.1

## 2024-10-16 MED ORDER — INSULIN ASPART 100 UNIT/ML IJ SOLN
0.0000 [IU] | Freq: Three times a day (TID) | INTRAMUSCULAR | Status: DC
  Administered 2024-10-17: 1 [IU] via SUBCUTANEOUS
  Administered 2024-10-17: 2 [IU] via SUBCUTANEOUS
  Administered 2024-10-17: 1 [IU] via SUBCUTANEOUS
  Administered 2024-10-18: 2 [IU] via SUBCUTANEOUS
  Administered 2024-10-18: 1 [IU] via SUBCUTANEOUS
  Administered 2024-10-18: 3 [IU] via SUBCUTANEOUS
  Administered 2024-10-19 – 2024-10-20 (×5): 2 [IU] via SUBCUTANEOUS
  Administered 2024-10-20: 1 [IU] via SUBCUTANEOUS
  Administered 2024-10-21: 2 [IU] via SUBCUTANEOUS
  Administered 2024-10-21: 3 [IU] via SUBCUTANEOUS
  Administered 2024-10-22 (×2): 1 [IU] via SUBCUTANEOUS
  Administered 2024-10-22 – 2024-10-23 (×3): 2 [IU] via SUBCUTANEOUS
  Filled 2024-10-16: qty 2
  Filled 2024-10-16: qty 1
  Filled 2024-10-16 (×2): qty 2
  Filled 2024-10-16: qty 1
  Filled 2024-10-16: qty 2
  Filled 2024-10-16: qty 3
  Filled 2024-10-16: qty 2
  Filled 2024-10-16: qty 1
  Filled 2024-10-16: qty 2
  Filled 2024-10-16: qty 1
  Filled 2024-10-16: qty 2
  Filled 2024-10-16: qty 1
  Filled 2024-10-16: qty 2
  Filled 2024-10-16: qty 3
  Filled 2024-10-16: qty 1
  Filled 2024-10-16 (×2): qty 2
  Filled 2024-10-16: qty 1

## 2024-10-16 MED ORDER — SPIRONOLACTONE 25 MG PO TABS
25.0000 mg | ORAL_TABLET | Freq: Every day | ORAL | Status: DC
  Administered 2024-10-16 – 2024-10-23 (×8): 25 mg via ORAL
  Filled 2024-10-16 (×8): qty 1

## 2024-10-16 MED ORDER — ROSUVASTATIN CALCIUM 10 MG PO TABS
40.0000 mg | ORAL_TABLET | Freq: Every day | ORAL | Status: DC
  Administered 2024-10-16 – 2024-10-23 (×8): 40 mg via ORAL
  Filled 2024-10-16 (×5): qty 4
  Filled 2024-10-16: qty 2
  Filled 2024-10-16 (×2): qty 4

## 2024-10-16 MED ORDER — DM-GUAIFENESIN ER 30-600 MG PO TB12
1.0000 | ORAL_TABLET | Freq: Two times a day (BID) | ORAL | Status: DC | PRN
  Administered 2024-10-18 – 2024-10-23 (×5): 1 via ORAL
  Filled 2024-10-16 (×6): qty 1

## 2024-10-16 MED ORDER — ACETAMINOPHEN 325 MG PO TABS: 650.0000 mg | ORAL_TABLET | Freq: Four times a day (QID) | ORAL | Status: DC | PRN

## 2024-10-16 MED ORDER — PANTOPRAZOLE SODIUM 40 MG PO TBEC
40.0000 mg | DELAYED_RELEASE_TABLET | Freq: Two times a day (BID) | ORAL | Status: DC
  Administered 2024-10-16 – 2024-10-23 (×15): 40 mg via ORAL
  Filled 2024-10-16 (×15): qty 1

## 2024-10-16 MED ORDER — APIXABAN 2.5 MG PO TABS
2.5000 mg | ORAL_TABLET | Freq: Two times a day (BID) | ORAL | Status: DC
  Administered 2024-10-16 – 2024-10-23 (×15): 2.5 mg via ORAL
  Filled 2024-10-16 (×15): qty 1

## 2024-10-16 MED ORDER — FUROSEMIDE 10 MG/ML IJ SOLN
40.0000 mg | Freq: Two times a day (BID) | INTRAMUSCULAR | Status: DC
  Administered 2024-10-16 – 2024-10-17 (×3): 40 mg via INTRAVENOUS
  Filled 2024-10-16 (×3): qty 4

## 2024-10-16 MED ORDER — EZETIMIBE 10 MG PO TABS: 10.0000 mg | ORAL_TABLET | Freq: Every day | ORAL | Status: DC

## 2024-10-16 MED ORDER — SODIUM CHLORIDE 0.9 % IV SOLN
2.0000 g | INTRAVENOUS | Status: DC
  Administered 2024-10-16: 2 g via INTRAVENOUS
  Filled 2024-10-16: qty 20

## 2024-10-16 NOTE — ED Notes (Signed)
 Relayed to MD that lab's reporting trop of 101

## 2024-10-16 NOTE — ED Notes (Signed)
 Pt ABCs intact. RR even and unlabored. Pt in NAD. Bed in lowest locked position. Call bell in reach. Denies needs at this time.    Past Medical History:  Diagnosis Date   AV block, Mobitz 1    CAD (coronary artery disease)    a. 08/2019 VF arrest-->sev 3vd on cath-->CABGx5 (LIMA->LAD, VG->OM->LCX, VG->RPDA, VG->Diag); b. 08/2021 Cath: sev native multivessel dzs w/ 5/5 patent grafts. Nl filling pressures->Med rx; c. 05/2023 Cath: stable anatomy->Med rx.   CKD (chronic kidney disease), stage III (HCC)    Diabetes mellitus without complication (HCC)    HFrEF (heart failure with reduced ejection fraction) (HCC)    a. 08/2020 Echo: EF 40-45%; b. 11/2020 Echo: EF 55%, Gr2 DD; c. 08/2021 Echo: EF 35-40%, glob HK w/ ant/antsept/apical HK; d. 10/2021 s/p CRT-P; e. 12/2021 Echo: EF 30-35%, glob HK, GrIII DD; f. 12/2022 Echo: EF 30-35%, glob HK, sev HK of ant wall, mod asymm LVH, nl RV fxn, mildly dil LA, triv MR, AoV sclerosis.   Hyperlipidemia LDL goal <70    Hypertension    Intermittent Complete heart block (HCC)    a. 08/2021 noted on Zio; b. 10/2021 s/p Abbott Allure RF CRT-P (Ser # [id number]).   Ischemic cardiomyopathy    a. 08/2020 Echo: EF 40-45%; b. 11/2020 Echo: EF 55%; c. 08/2021 Echo: EF 35-40%; d. 10/2021 s/p CRT-P; e. 12/2021 Echo: EF 30-35%, glob HK, GrIII DD.   PAF (paroxysmal atrial fibrillation) (HCC)    a.Medical device check-->Eliquis  (CHA2DS2VASc = 6)   Trifascicular block

## 2024-10-16 NOTE — ED Notes (Signed)
 Spoke with pt daughter, Jon, after pt confirmed her identity and gave permission for daughter to receive an update.

## 2024-10-16 NOTE — ED Notes (Signed)
 Dinner tray delivered.

## 2024-10-16 NOTE — ED Notes (Signed)
 Dinner tray set up for pt. Urine canister emptied. Antibiotics were hung. Pt pulled up in the bed.

## 2024-10-16 NOTE — Progress Notes (Signed)
   Brief Progress Note   _____________________________________________________________________________________________________________  Patient Name: [name] Patient DOB: Oct 01, 1935 Date: @TODAY @      Data: Reviewed labs, vital signs, and notes.    Action: No action required at this time.    Response:    _____________________________________________________________________________________________________________  [facility] RN Expeditor Shayley Medlin S Nisa Decaire Please contact us  directly via secure chat (search for [facility]) or by calling us  at [phone] [facility]).

## 2024-10-16 NOTE — Progress Notes (Addendum)
 Progress Note   Patient: [name] FMW:[id number] DOB: [birth year] DOA: 10/15/2024     1 DOS: the patient was seen and examined on 10/16/2024   Brief hospital course:   89 y.o. male with medical history significant of PAF on Eliquis , sCHF with EF 35-40, HTN, HLD, DM, GERD, CKD-4, CAD, s/p of stent, CABG, cardiac arrest, complete heart block, s/p of pacemaker, GIB, who presents with shortness of breath.  He was found to have pro-BNP > 35,000, rop 124 --> 114, lactic acid 3.5, WBC 5.6, renal functions worse than baseline, negative PCR for COVID, flu and RSV, UA (cloudy appearance, large amount of leukocyte, moderate amount of leukocyte, WBC> 50).  Temperature 97.3, blood pressure 128/104, heart rate 72, RR 14.  CT of head negative for acute intracranial abnormalities.  Patient is admitted to PCU as inpatient for management of acute on chronic systolic CHF.    Assessment and Plan:   Acute on chronic respiratory failure with hypoxia due to acute on chronic systolic CHF: Ischemic cardiomyopathy, Abbott CRT-P device in place  pt has leg edema, SOB, significantly elevated BNP> 35,000, clinically consistent with CHF exacerbation.   2D echo on 08/17/2024 showed EF of 25-30%.   Placed on 4 L oxygen here He uses home oxygen 2 L at nighttime only for nocturnal hypoxia.  -Lasix  40 mg bid IV with close monitoring electrolytes and renal function. -Continue home Entresto  49-51 PO BID, coreg  12.5 PO BID and spironolactone  25 mg PO Daily. -Restart farxiga  once kidney function improves. -Daily weights -strict I/O's -Low salt diet -Fluid restriction -As needed bronchodilators for shortness of breath   Acute uncomplicated UTI: -NGTD on blood cultures - On IV Rocephin  - Follow-up for urine culture   Proctitis: CT showed circumferential rectal wall thickening with perirectal edema, possibly related to infectious or inflammatory proctitis. - IV Rocephin  and Flagyl  - Follow-up of blood culture   Type  II diabetes mellitus with renal manifestations: Recent A1c 7.3, poorly controlled.  Patient taking Farxiga , glargine insulin  15 units daily -SSI -Glargine insulin  10 units daily   Dyslipidemia -Crestor  and zetia    Paroxysmal atrial fibrillation: -Continue Eliquis  2.5 mg PO BID and Coreg    CKD-4: Patient's baseline creatinine 1.9-2.3, but recently patient had elevated creatinine to 2.83 on 10/10/2024.   -Follow-up with BMP   CAD, s/p CABG x 5 and Myocardial injury: trop  124 --> 114, no chest pain, likely due to demand ischemia. - Trend troponin - Continue Zetia  and Crestor    HTN : -Continue Entresto , coreg  -Also on spironolactone , Lasix  -prn hydralazine  IV   Elevated lactic acid level: Lactic acid 3.5 -->2.3.  - Trend lactic acid level - Will not give IV fluids due to CHF exacerbation   Thrombocytopenia: Platelet 116, this is chronic issue.  No active bleeding. -Follow-up with CBC   Hypothermia -Bair hugger   Obesity (BMI 30-39.9): Patient has Obesity Class I, with body weight 99.1 Kg and BMI 33.21 kg/m2.  - Encourage losing weight - Exercise and healthy diet  DVT Prophylaxis: on eliquis   Disposition: Lives at home with his wife and daughter.     Subjective: Shortness of breath has improved. We spoke about management of acute on chronic systolic CHF management. He lives at home with his wife and daughter.  Physical Exam: Vitals:   10/16/24 0818 10/16/24 0830 10/16/24 1012 10/16/24 1013  BP:  (!) 140/83 114/77   Pulse:  68 68 69  Resp:  17 13 16   Temp: (!) 97.5 F (36.4  C)     TempSrc: Oral     SpO2:  (!) 80% 92% 96%  Weight:      Height:       Constitutional: NAD, calm, comfortable Eyes: PERRL, lids and conjunctivae normal ENMT: Mucous membranes are moist. Posterior pharynx clear of any exudate or lesions.Normal dentition.  Neck: normal, supple, no masses, no thyromegaly Respiratory: clear to auscultation bilaterally, no wheezing, no crackles. Normal  respiratory effort. No accessory muscle use.  Cardiovascular: Regular rate and rhythm, no murmurs / rubs / gallops. No extremity edema. 2+ pedal pulses. No carotid bruits.  Abdomen: no tenderness, no masses palpated. No hepatosplenomegaly. Bowel sounds positive.  Musculoskeletal: no clubbing / cyanosis. No joint deformity upper and lower extremities. Good ROM, no contractures. Normal muscle tone.  Skin: no rashes, lesions, ulcers. No induration Neurologic: CN 2-12 grossly intact. Sensation intact, DTR normal. Strength 5/5 x all 4 extremities.   Data Reviewed:  There are no new results to review at this time.  Family Communication: None at the bedside  Disposition: Status is: Inpatient Remains inpatient appropriate because: Acute CHF  Planned Discharge Destination: Home with Home Health    Time spent: 40 minutes  Author: Deliliah Room, MD 10/16/2024 10:35 AM  For on call review [email or web address].

## 2024-10-16 NOTE — ED Notes (Signed)
 At this time, this EDT and [facility], checked on this pt to change his linens. Provided peri care, new chux and a clean brief. Pt is now comfortable in bed, with no needs at this time, call light within reach.

## 2024-10-17 ENCOUNTER — Encounter: Payer: Self-pay | Admitting: Internal Medicine

## 2024-10-17 LAB — CBC WITH DIFFERENTIAL/PLATELET
Abs Immature Granulocytes: 0.02 K/uL (ref 0.00–0.07)
Basophils Absolute: 0 K/uL (ref 0.0–0.1)
Basophils Relative: 1 %
Eosinophils Absolute: 0.4 K/uL (ref 0.0–0.5)
Eosinophils Relative: 8 %
HCT: 42.9 % (ref 39.0–52.0)
Hemoglobin: 13.6 g/dL (ref 13.0–17.0)
Immature Granulocytes: 0 %
Lymphocytes Relative: 15 %
Lymphs Abs: 0.7 K/uL (ref 0.7–4.0)
MCH: 28.2 pg (ref 26.0–34.0)
MCHC: 31.7 g/dL (ref 30.0–36.0)
MCV: 89 fL (ref 80.0–100.0)
Monocytes Absolute: 0.7 K/uL (ref 0.1–1.0)
Monocytes Relative: 14 %
Neutro Abs: 3 K/uL (ref 1.7–7.7)
Neutrophils Relative %: 62 %
Platelets: 113 K/uL — ABNORMAL LOW (ref 150–400)
RBC: 4.82 MIL/uL (ref 4.22–5.81)
RDW: 15.9 % — ABNORMAL HIGH (ref 11.5–15.5)
WBC: 4.8 K/uL (ref 4.0–10.5)
nRBC: 0 % (ref 0.0–0.2)

## 2024-10-17 LAB — BASIC METABOLIC PANEL WITH GFR
Anion gap: 13 (ref 5–15)
BUN: 69 mg/dL — ABNORMAL HIGH (ref 8–23)
CO2: 28 mmol/L (ref 22–32)
Calcium: 7.3 mg/dL — ABNORMAL LOW (ref 8.9–10.3)
Chloride: 102 mmol/L (ref 98–111)
Creatinine, Ser: 2.72 mg/dL — ABNORMAL HIGH (ref 0.61–1.24)
GFR, Estimated: 22 mL/min — ABNORMAL LOW (ref >60)
Glucose, Bld: 111 mg/dL — ABNORMAL HIGH (ref 70–99)
Potassium: 3.5 mmol/L (ref 3.5–5.1)
Sodium: 143 mmol/L (ref 135–145)

## 2024-10-17 LAB — BLOOD CULTURE ID PANEL (REFLEXED) - BCID2

## 2024-10-17 LAB — GLUCOSE, CAPILLARY
Glucose-Capillary: 127 mg/dL — ABNORMAL HIGH (ref 70–99)
Glucose-Capillary: 133 mg/dL — ABNORMAL HIGH (ref 70–99)
Glucose-Capillary: 154 mg/dL — ABNORMAL HIGH (ref 70–99)
Glucose-Capillary: 79 mg/dL (ref 70–99)

## 2024-10-17 MED ORDER — METRONIDAZOLE 500 MG PO TABS
500.0000 mg | ORAL_TABLET | Freq: Two times a day (BID) | ORAL | Status: AC
  Administered 2024-10-17 – 2024-10-22 (×12): 500 mg via ORAL
  Filled 2024-10-17 (×12): qty 1

## 2024-10-17 MED ORDER — OXYCODONE HCL 5 MG PO TABS
5.0000 mg | ORAL_TABLET | Freq: Four times a day (QID) | ORAL | Status: DC | PRN
  Administered 2024-10-17 – 2024-10-19 (×2): 5 mg via ORAL
  Filled 2024-10-17 (×2): qty 1

## 2024-10-17 MED ORDER — FUROSEMIDE 40 MG PO TABS
40.0000 mg | ORAL_TABLET | Freq: Every day | ORAL | Status: DC
  Administered 2024-10-17 – 2024-10-18 (×2): 40 mg via ORAL
  Filled 2024-10-17 (×2): qty 1

## 2024-10-17 MED ORDER — SODIUM CHLORIDE 0.9 % IV SOLN
2.0000 g | INTRAVENOUS | Status: DC
  Administered 2024-10-17 – 2024-10-22 (×6): 2 g via INTRAVENOUS
  Filled 2024-10-17 (×6): qty 12.5

## 2024-10-17 NOTE — Care Management Important Message (Signed)
 Important Message  Patient Details  Name: [name] MRN: [id number] Date of Birth: [birth year]   Important Message Given:  Yes - Medicare IM     [name] 10/17/2024, 4:28 PM

## 2024-10-17 NOTE — TOC Initial Note (Signed)
 Transition of Care [facility]) - Initial/Assessment Note    Patient Details  Name: [name] MRN: [id number] Date of Birth: 07-03-1935  Transition of Care [facility]) CM/SW Contact:    [name], RN Phone Number: 10/17/2024, 10:34 AM  Clinical Narrative:                 Patient awake, alert and sitting up in bed. Alone in room. Appears oriented. Readmission risk assessment complete. Patient lives with wife and 2 adult daughters. Wife or daughters provide transportation to appointments and will pick him up at discharge. Confirmed PCP. [facility] [address]. Denies problems affording medications. No HH services currently, but does have DME including walker and a cane. Does not wish to engage [facility] service at this time.   No additional TOC needs at this time. Will monitor.        Patient Goals and CMS Choice            Expected Discharge Plan and Services                                              Prior Living Arrangements/Services                       Activities of Daily Living   ADL Screening (condition at time of admission) Independently performs ADLs?: Yes (appropriate for developmental age) Is the patient deaf or have difficulty hearing?: No Does the patient have difficulty seeing, even when wearing glasses/contacts?: No Does the patient have difficulty concentrating, remembering, or making decisions?: No  Permission Sought/Granted                  Emotional Assessment              Admission diagnosis:  Respiratory distress [R06.03] Hypoxia [R09.02] Elevated troponin [R79.89] Acute on chronic systolic congestive heart failure (HCC) [I50.23] Acute cystitis with hematuria [N30.01] Acute on chronic systolic CHF (congestive heart failure) (HCC) [I50.23] Patient Active Problem List   Diagnosis Date Noted   UTI (urinary tract infection) 10/16/2024   Proctitis 10/16/2024   Myocardial injury 10/16/2024   Elevated lactic acid level  10/16/2024   Hypothermia 10/16/2024   Upper GI bleed 09/16/2024   Hematochezia 09/16/2024   CKD (chronic kidney disease), stage IV (HCC) 09/16/2024   CHF (congestive heart failure) (HCC) 07/06/2024   Acute on chronic HFrEF (heart failure with reduced ejection fraction) (HCC) 03/24/2024   Scrotal edema 03/24/2024   Acute renal failure superimposed on stage 3b chronic kidney disease (HCC) 03/24/2024   Shortness of breath 12/24/2023   COVID-19 12/23/2023   CHF exacerbation (HCC) 12/23/2023   ACS (acute coronary syndrome) (HCC) 06/03/2023   Chronic systolic CHF (congestive heart failure) (HCC) 06/03/2023   Cardiac resynchronization therapy pacemaker (CRT-P) in place 05/10/2023   Acute on chronic congestive heart failure (HCC) 03/09/2023   Paroxysmal A-fib (HCC) 03/09/2023   Hypomagnesemia 03/09/2023   Hypercoagulable state due to paroxysmal atrial fibrillation (HCC) 02/08/2023   Paroxysmal atrial fibrillation (HCC) 02/08/2023   Constipation 12/19/2022   Chest pain 12/19/2022   Hypertensive heart disease with heart failure (HCC) 12/18/2022   Thrombocytopenia 12/17/2022   CKD stage 3b, GFR 30-44 ml/min (HCC) 12/17/2022   Obesity (BMI 30-39.9) 12/17/2022   Hypokalemia 12/17/2022   Ischemic cardiomyopathy 12/17/2022   Hypertensive urgency 03/29/2022  Dyslipidemia 03/29/2022   GERD without esophagitis 03/29/2022   Type 2 diabetes mellitus with hyperlipidemia (HCC) 03/29/2022   Elevated troponin 03/29/2022   Acute on chronic systolic CHF (congestive heart failure) (HCC) 01/04/2022   Dilated cardiomyopathy (HCC) 10/30/2021   Acute decompensated heart failure (HCC) 10/27/2021   Complete heart block s/p pacemaker (HCC) 10/12/2021   Junctional bradycardia    Acute on chronic diastolic CHF (congestive heart failure) (HCC) 08/26/2021   CAD (coronary artery disease)    HLD (hyperlipidemia)    GERD (gastroesophageal reflux disease)    Type II diabetes mellitus with renal manifestations (HCC)     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   CAD, s/p CABG x 5 08/25/2020   Non-ST elevation (NSTEMI) myocardial infarction (HCC) 08/21/2020   History of cardiac arrest 08/21/2020   HFrEF (heart failure with reduced ejection fraction) (HCC)    History of sustained ventricular fibrillation 08/18/2020   Acute respiratory failure with hypoxia (HCC)    Chronic diastolic heart failure (HCC)    Urinary tract infection 07/26/2019   HTN (hypertension) 07/26/2019   Diabetes (HCC) 07/26/2019   Unstable angina (HCC) 12/03/2008   Status post percutaneous transluminal coronary angioplasty 10/08/2008   Malignant neoplasm of prostate (HCC) 10/05/2005   PCP:  [name], MD Pharmacy:   [phone] [place] [place] - [address] [place] [address] Phone: [phone] Fax: [phone]  [facility] [address] [place] [address] Phone: [phone] Fax: [phone]  [id number] [place], [place] - [address] AT [facility] OF [address] [place] [address] Phone: [phone] Fax: [phone]     Social Drivers of Health (SDOH) Social History: SDOH Screenings   Food Insecurity: No Food Insecurity (10/16/2024)  Housing: Low Risk  (10/17/2024)  Transportation Needs: No Transportation Needs (10/17/2024)  Utilities: Not At Risk (10/16/2024)  Depression (PHQ2-9): Low Risk  (04/07/2022)  Financial Resource Strain: Low Risk (09/21/2024)   Received from [facility]  Physical Activity: Inactive (07/27/2019)  Social Connections: Moderately Integrated (10/16/2024)  Stress: No Stress Concern Present (07/27/2019)  Tobacco Use: Medium Risk (10/17/2024)   SDOH Interventions:     Readmission Risk Interventions    10/17/2024   10:32 AM 03/26/2024    4:09 PM 03/10/2023   12:47 PM  Readmission Risk Prevention Plan  Transportation Screening Complete Complete Complete  PCP  or Specialist Appt within 3-5 Days   Complete  Social Work Consult for Recovery Care Planning/Counseling   Complete  Palliative Care Screening   Not Applicable  Medication Review Oceanographer) Complete Complete Complete  PCP or Specialist appointment within 3-5 days of discharge Complete Complete   HRI or Home Care Consult Complete Complete   SW Recovery Care/Counseling Consult Complete Complete   Palliative Care Screening Not Applicable Not Applicable   Skilled Nursing Facility Not Applicable Not Applicable

## 2024-10-17 NOTE — Progress Notes (Signed)
 PHARMACY - PHYSICIAN COMMUNICATION CRITICAL VALUE ALERT - BLOOD CULTURE IDENTIFICATION (BCID)  [name] is an 89 y.o. male who presented to [facility] on 10/15/2024 with a chief complaint of shortness of breath  Assessment:  12/8 blood culture with GNR in 1 of 4 bottles, BCID did not detect any organisms.  On antibiotics currently for UTI and proctitis  Name of physician (or Provider) Contacted: Dr Dino  Current antibiotics: Ceftriaxone  and metronidazole   Changes to prescribed antibiotics recommended:  Recommendations accepted by provider - ceftriaxone  to cefepime  pending identification of organism  Results for orders placed or performed during the hospital encounter of 10/15/24  Blood Culture ID Panel (Reflexed) (Collected: 10/15/2024  7:41 PM)  Result Value Ref Range   Enterococcus faecalis NOT DETECTED NOT DETECTED   Enterococcus Faecium NOT DETECTED NOT DETECTED   Listeria monocytogenes NOT DETECTED NOT DETECTED   Staphylococcus species NOT DETECTED NOT DETECTED   Staphylococcus aureus (BCID) NOT DETECTED NOT DETECTED   Staphylococcus epidermidis NOT DETECTED NOT DETECTED   Staphylococcus lugdunensis NOT DETECTED NOT DETECTED   Streptococcus species NOT DETECTED NOT DETECTED   Streptococcus agalactiae NOT DETECTED NOT DETECTED   Streptococcus pneumoniae NOT DETECTED NOT DETECTED   Streptococcus pyogenes NOT DETECTED NOT DETECTED   A.calcoaceticus-baumannii NOT DETECTED NOT DETECTED   Bacteroides fragilis NOT DETECTED NOT DETECTED   Enterobacterales NOT DETECTED NOT DETECTED   Enterobacter cloacae complex NOT DETECTED NOT DETECTED   Escherichia coli NOT DETECTED NOT DETECTED   Klebsiella aerogenes NOT DETECTED NOT DETECTED   Klebsiella oxytoca NOT DETECTED NOT DETECTED   Klebsiella pneumoniae NOT DETECTED NOT DETECTED   Proteus species NOT DETECTED NOT DETECTED   Salmonella species NOT DETECTED NOT DETECTED   Serratia marcescens NOT DETECTED NOT DETECTED   Haemophilus  influenzae NOT DETECTED NOT DETECTED   Neisseria meningitidis NOT DETECTED NOT DETECTED   Pseudomonas aeruginosa NOT DETECTED NOT DETECTED   Stenotrophomonas maltophilia NOT DETECTED NOT DETECTED   Candida albicans NOT DETECTED NOT DETECTED   Candida auris NOT DETECTED NOT DETECTED   Candida glabrata NOT DETECTED NOT DETECTED   Candida krusei NOT DETECTED NOT DETECTED   Candida parapsilosis NOT DETECTED NOT DETECTED   Candida tropicalis NOT DETECTED NOT DETECTED   Cryptococcus neoformans/gattii NOT DETECTED NOT DETECTED    Celestine Slovak, PharmD, BCPS, BCIDP Work Cell: [phone] 10/17/2024 8:22 AM

## 2024-10-17 NOTE — Progress Notes (Addendum)
 Progress Note   Patient: [name] FMW:[id number] DOB: February 24, 1935 DOA: 10/15/2024     2 DOS: the patient was seen and examined on 10/17/2024   Brief hospital course:   89 y.o. male with medical history significant of PAF on Eliquis , sCHF with EF 35-40, HTN, HLD, DM, GERD, CKD-4, CAD, s/p of stent, CABG, cardiac arrest, complete heart block, s/p of pacemaker, GIB, who presents with shortness of breath.  He was found to have pro-BNP > 35,000, rop 124 --> 114, lactic acid 3.5, WBC 5.6, renal functions worse than baseline, negative PCR for COVID, flu and RSV, UA (cloudy appearance, large amount of leukocyte, moderate amount of leukocyte, WBC> 50).  Temperature 97.3, blood pressure 128/104, heart rate 72, RR 14.  CT of head negative for acute intracranial abnormalities.    Patient is admitted to PCU as inpatient for management of acute on chronic systolic CHF.    Assessment and Plan:   Acute on chronic respiratory failure with hypoxia due to acute on chronic systolic CHF: Ischemic cardiomyopathy, Abbott CRT-P device in place  pt has leg edema, SOB, significantly elevated BNP> 35,000, clinically consistent with CHF exacerbation.   2D echo on 08/17/2024 showed EF of 25-30%.   Placed on 4 L oxygen here He uses home oxygen 2 L at nighttime only for nocturnal hypoxia.  -Lasix  40 mg bid IV with close monitoring electrolytes and renal function. -Continue home Entresto  49-51 PO BID, coreg  12.5 PO BID and spironolactone  25 mg PO Daily. -Restart farxiga  once kidney function improves. -Daily weights -strict I/O's -Low salt diet -Fluid restriction -As needed bronchodilators for shortness of breath -6 minute walk test ordered.   Acute uncomplicated UTI secondary to E.coli: - On IV Cefepime  (changed from ceftriaxone  on 12/10) - Follow-up for urine culture-E.Coli   Proctitis: CT showed circumferential rectal wall thickening with perirectal edema, possibly related to infectious or inflammatory  proctitis. - IV Rocephin  and Flagyl  - Follow-up of blood culture-GNR in 1 of 4 bottles  Gram negative rods bacteremia: 12/8 blood cultures with GNR in 1 out of 4 bottles. BCID didn't detect any organisms Changed to cefepime  from ceftriaxone  after discussing with pharmacist on 12/10.   Type II diabetes mellitus with renal manifestations: Recent A1c 7.3, poorly controlled.  Patient taking Farxiga , glargine insulin  15 units daily -SSI -Glargine insulin  10 units daily   Dyslipidemia -Crestor  and zetia    Paroxysmal atrial fibrillation: -Continue Eliquis  2.5 mg PO BID and Coreg    CKD-4: Patient's baseline creatinine 1.9-2.3, but recently patient had elevated creatinine to 2.83 on 10/10/2024.   -Follow-up with BMP   CAD, s/p CABG x 5 and Myocardial injury: trop  124 --> 114, no chest pain, likely due to demand ischemia. - Trend troponin - Continue Zetia  and Crestor    HTN : -Continue Entresto , coreg  -Also on spironolactone , Lasix  -prn hydralazine  IV   Elevated lactic acid level: Lactic acid 3.5 -->2.3.  - Trend lactic acid level - Will not give IV fluids due to CHF exacerbation   Thrombocytopenia: Platelet 116, this is chronic issue.  No active bleeding. -Follow-up with CBC   Hypothermia -Bair hugger   Obesity (BMI 30-39.9): Patient has Obesity Class I, with body weight 99.1 Kg and BMI 33.21 kg/m2.  - Encourage losing weight - Exercise and healthy diet  DVT Prophylaxis: on eliquis   Disposition: Lives at home with his wife and daughter.     Subjective: Denied any active complaints. No acute events overnight.  Physical Exam: Vitals:   10/16/24 2230  10/16/24 2337 10/17/24 0405 10/17/24 0828  BP: 102/70 115/76 119/77 119/78  Pulse: 69 69 67 72  Resp: 12 20 16 18   Temp: 97.9 F (36.6 C) (!) 97.5 F (36.4 C) 97.9 F (36.6 C) (!) 97.4 F (36.3 C)  TempSrc: Oral     SpO2: 96% 97% 99% 100%  Weight:  (P) 102.5 kg    Height:  (P) 5' 8 (1.727 m)     Constitutional: NAD,  calm, comfortable Eyes: PERRL, lids and conjunctivae normal ENMT: Mucous membranes are moist. Posterior pharynx clear of any exudate or lesions.Normal dentition.  Neck: normal, supple, no masses, no thyromegaly Respiratory: clear to auscultation bilaterally, no wheezing, no crackles. Normal respiratory effort. No accessory muscle use.  Cardiovascular: Regular rate and rhythm, no murmurs / rubs / gallops. No extremity edema. 2+ pedal pulses. No carotid bruits.  Abdomen: no tenderness, no masses palpated. No hepatosplenomegaly. Bowel sounds positive.  Musculoskeletal: no clubbing / cyanosis. No joint deformity upper and lower extremities. Good ROM, no contractures. Normal muscle tone.  Skin: no rashes, lesions, ulcers. No induration Neurologic: CN 2-12 grossly intact. Sensation intact, DTR normal. Strength 5/5 x all 4 extremities.   Data Reviewed:  There are no new results to review at this time.  Family Communication: None at the bedside  Disposition: Status is: Inpatient Remains inpatient appropriate because: Acute CHF  Planned Discharge Destination: Home with Home Health    Time spent: 40 minutes  Author: Deliliah Room, MD 10/17/2024 10:28 AM  For on call review [email or web address].

## 2024-10-18 LAB — CBC
HCT: 39.5 % (ref 39.0–52.0)
Hemoglobin: 12.5 g/dL — ABNORMAL LOW (ref 13.0–17.0)
MCH: 27.8 pg (ref 26.0–34.0)
MCHC: 31.6 g/dL (ref 30.0–36.0)
MCV: 88 fL (ref 80.0–100.0)
Platelets: 115 K/uL — ABNORMAL LOW (ref 150–400)
RBC: 4.49 MIL/uL (ref 4.22–5.81)
RDW: 15.8 % — ABNORMAL HIGH (ref 11.5–15.5)
WBC: 4.6 K/uL (ref 4.0–10.5)
nRBC: 0 % (ref 0.0–0.2)

## 2024-10-18 LAB — BLOOD CULTURE ID PANEL (REFLEXED) - BCID2

## 2024-10-18 LAB — GLUCOSE, CAPILLARY
Glucose-Capillary: 160 mg/dL — ABNORMAL HIGH (ref 70–99)
Glucose-Capillary: 144 mg/dL — ABNORMAL HIGH (ref 70–99)
Glucose-Capillary: 221 mg/dL — ABNORMAL HIGH (ref 70–99)
Glucose-Capillary: 66 mg/dL — ABNORMAL LOW (ref 70–99)
Glucose-Capillary: 143 mg/dL — ABNORMAL HIGH (ref 70–99)
Glucose-Capillary: 50 mg/dL — ABNORMAL LOW (ref 70–99)

## 2024-10-18 LAB — BASIC METABOLIC PANEL WITH GFR
Anion gap: 10 (ref 5–15)
BUN: 66 mg/dL — ABNORMAL HIGH (ref 8–23)
CO2: 25 mmol/L (ref 22–32)
Calcium: 7.2 mg/dL — ABNORMAL LOW (ref 8.9–10.3)
Chloride: 104 mmol/L (ref 98–111)
Creatinine, Ser: 2.67 mg/dL — ABNORMAL HIGH (ref 0.61–1.24)
GFR, Estimated: 22 mL/min — ABNORMAL LOW (ref >60)
Glucose, Bld: 141 mg/dL — ABNORMAL HIGH (ref 70–99)
Potassium: 4.1 mmol/L (ref 3.5–5.1)
Sodium: 139 mmol/L (ref 135–145)

## 2024-10-18 LAB — URINE CULTURE: Culture: >=100000 — AB

## 2024-10-18 MED ORDER — VANCOMYCIN HCL 2000 MG/400ML IV SOLN
2000.0000 mg | Freq: Once | INTRAVENOUS | Status: AC
  Administered 2024-10-18: 2000 mg via INTRAVENOUS
  Filled 2024-10-18: qty 400

## 2024-10-18 MED ORDER — DEXTROSE 50 % IV SOLN
1.0000 | Freq: Once | INTRAVENOUS | Status: AC
  Administered 2024-10-18: 50 mL via INTRAVENOUS
  Filled 2024-10-18: qty 50

## 2024-10-18 MED ORDER — VANCOMYCIN HCL 1500 MG/300ML IV SOLN: 1500.0000 mg | INTRAVENOUS | Status: DC

## 2024-10-18 MED ORDER — ACETAMINOPHEN 325 MG PO TABS: 650.0000 mg | ORAL_TABLET | Freq: Four times a day (QID) | ORAL | Status: DC | PRN

## 2024-10-18 NOTE — Progress Notes (Signed)
 Progress Note    [name]  FMW:[id number] DOB: June 05, 1935  DOA: 10/15/2024 PCP: [name], MD      Brief Narrative:    Medical records reviewed and are as summarized below:  [name] is a 89 y.o. male with medical history significant of PAF on Eliquis , sCHF with EF 35-40, HTN, HLD, DM, GERD, CKD-4, CAD, s/p of stent, CABG, cardiac arrest, complete heart block, s/p of pacemaker, GIB, who presents with shortness of breath.  He was found to have pro-BNP > 35,000, rop 124 --> 114, lactic acid 3.5, WBC 5.6, renal functions worse than baseline, negative PCR for COVID, flu and RSV, UA (cloudy appearance, large amount of leukocyte, moderate amount of leukocyte, WBC> 50).  Temperature 97.3, blood pressure 128/104, heart rate 72, RR 14.  CT of head negative for acute intracranial abnormalities.     Patient is admitted to PCU as inpatient for management of acute on chronic systolic CHF.         Assessment/Plan:   Principal Problem:   Acute on chronic systolic CHF (congestive heart failure) (HCC) Active Problems:   Acute respiratory failure with hypoxia (HCC)   UTI (urinary tract infection)   Proctitis   CAD, s/p CABG x 5   Myocardial injury   HTN (hypertension)   HLD (hyperlipidemia)   Type II diabetes mellitus with renal manifestations (HCC)   Paroxysmal atrial fibrillation (HCC)   Elevated lactic acid level   Thrombocytopenia   CKD (chronic kidney disease), stage IV (HCC)   Hypothermia   Obesity (BMI 30-39.9)     Body mass index is 34.36 kg/m (pended).   Acute on chronic hypoxic respiratory failure: Continue 2 L/min oxygen via [place].  He said he usually uses oxygen at night.    Acute on chronic HFrEF: S/p treatment with IV Lasix .  Continue oral Lasix , Aldactone , Entresto  and carvedilol .   2D echo in October 2025 showed EF estimated at 25 to 30%.   Lactic acidosis: Probably from severe sepsis Acute E. coli UTI: Urine culture showed E. coli.   Continue IV cefepime  Staph species and gram-negative rod bacteremia: 1 out of 4 bottles positive for staph species in 1 out of 4 bottles positive for gram-negative rods.  Suspect these are contaminants.  However, we will continue broad-spectrum IV antibiotics with cefepime  and vancomycin  for now.  Repeat blood cultures.  Proctitis: Continue broad-spectrum antibiotics   Hypothermia: Resolved   Type II DM: Continue Lantus  and NovoLog  as needed   Paroxysmal atrial fibrillation: Continue Eliquis  and carvedilol    General weakness: PT and OT evaluation   Comorbidities include CKD stage IV, hypertension, CAD s/p CABG, dyslipidemia, thrombocytopenia    Diet Order             DIET DYS 2 Fluid consistency: Thin  Diet effective now                                  Consultants: None  Procedures: None    Medications:    apixaban   2.5 mg Oral BID   carvedilol   12.5 mg Oral BID WC   furosemide   40 mg Oral Daily   insulin  aspart  0-5 Units Subcutaneous QHS   insulin  aspart  0-9 Units Subcutaneous TID WC   insulin  glargine  10 Units Subcutaneous QHS   metroNIDAZOLE   500 mg Oral Q12H   pantoprazole   40 mg Oral BID  rosuvastatin   40 mg Oral Daily   sacubitril -valsartan   1 tablet Oral BID   spironolactone   25 mg Oral Daily   tamsulosin   0.4 mg Oral Daily   Continuous Infusions:  ceFEPime  (MAXIPIME ) IV 2 g (10/18/24 0922)   [START ON 10/20/2024] vancomycin        Anti-infectives (From admission, onward)    Start     Dose/Rate Route Frequency Ordered Stop   10/20/24 0500  vancomycin  (VANCOREADY) IVPB 1500 mg/300 mL       Placed in Followed by Linked Group   1,500 mg 150 mL/hr over 120 Minutes Intravenous Every 48 hours 10/18/24 0353     10/18/24 0500  vancomycin  (VANCOREADY) IVPB 2000 mg/400 mL       Placed in Followed by Linked Group   2,000 mg 200 mL/hr over 120 Minutes Intravenous  Once 10/18/24 0353 10/18/24 0708   10/17/24 1000   metroNIDAZOLE  (FLAGYL ) tablet 500 mg        500 mg Oral Every 12 hours 10/17/24 0747     10/17/24 1000  ceFEPIme  (MAXIPIME ) 2 g in sodium chloride  0.9 % 100 mL IVPB        2 g 200 mL/hr over 30 Minutes Intravenous Every 24 hours 10/17/24 0829     10/16/24 2000  cefTRIAXone  (ROCEPHIN ) 2 g in sodium chloride  0.9 % 100 mL IVPB  Status:  Discontinued        2 g 200 mL/hr over 30 Minutes Intravenous Every 24 hours 10/16/24 0004 10/17/24 0829   10/16/24 0015  metroNIDAZOLE  (FLAGYL ) IVPB 500 mg  Status:  Discontinued        500 mg 100 mL/hr over 60 Minutes Intravenous Every 12 hours 10/16/24 0004 10/17/24 0747   10/15/24 2015  cefTRIAXone  (ROCEPHIN ) 2 g in sodium chloride  0.9 % 100 mL IVPB        2 g 200 mL/hr over 30 Minutes Intravenous Once 10/15/24 2011 10/15/24 2123   10/15/24 2015  azithromycin  (ZITHROMAX ) 500 mg in sodium chloride  0.9 % 250 mL IVPB        500 mg 250 mL/hr over 60 Minutes Intravenous  Once 10/15/24 2011 10/15/24 2202              Family Communication/Anticipated D/C date and plan/Code Status   DVT prophylaxis: apixaban  (ELIQUIS ) tablet 2.5 mg Start: 10/16/24 0115 apixaban  (ELIQUIS ) tablet 2.5 mg     Code Status: Prior  Family Communication: None Disposition Plan: Plan to discharge home   Status is: Inpatient Remains inpatient appropriate because: CHF, sepsis from UTI, possible bacteremia       Subjective:   Interval events noted.  No complaints.  Objective:    Vitals:   10/18/24 0004 10/18/24 0511 10/18/24 0847 10/18/24 1258  BP: 113/66 115/68 120/67 (!) 102/57  Pulse: 70 70 83 69  Resp: 17 16 16 18   Temp: 97.8 F (36.6 C) 97.9 F (36.6 C) 98 F (36.7 C) 98 F (36.7 C)  TempSrc:      SpO2: 96% 95% 99% 96%  Weight:  102.5 kg    Height:       No data found.   Intake/Output Summary (Last 24 hours) at 10/18/2024 1426 Last data filed at 10/18/2024 0511 Gross per 24 hour  Intake 471.11 ml  Output 1000 ml  Net -528.89 ml   Filed  Weights   10/15/24 1935 10/16/24 2337 10/18/24 0511  Weight: 99.1 kg (P) 102.5 kg 102.5 kg    Exam:  GEN: NAD SKIN:  Warm and dry EYES: No pallor or icterus ENT: MMM CV: RRR PULM: Bilateral rhonchi ABD: soft, obese, NT, +BS CNS: AAO x 3, non focal EXT: Bilateral leg edema, no tenderness or erythema        Data Reviewed:   I have personally reviewed following labs and imaging studies:  Labs: Labs show the following:   Basic Metabolic Panel: Recent Labs  Lab 10/15/24 1935 10/17/24 0425 10/18/24 1033  NA 144 143 139  K 3.6 3.5 4.1  CL 100 102 104  CO2 25 28 25   GLUCOSE 125* 111* 141*  BUN 72* 69* 66*  CREATININE 2.87* 2.72* 2.67*  CALCIUM  8.5* 7.3* 7.2*   GFR Estimated Creatinine Clearance: 22.6 mL/min (A) (by C-G formula based on SCr of 2.67 mg/dL (H)). Liver Function Tests: Recent Labs  Lab 10/15/24 1935  AST 29  ALT 8  ALKPHOS 85  BILITOT 1.0  PROT 8.3*  ALBUMIN  4.4   No results for input(s): LIPASE, AMYLASE in the last 168 hours. No results for input(s): AMMONIA in the last 168 hours. Coagulation profile No results for input(s): INR, PROTIME in the last 168 hours.  CBC: Recent Labs  Lab 10/15/24 1935 10/17/24 0425 10/18/24 1033  WBC 5.6 4.8 4.6  NEUTROABS 4.0 3.0  --   HGB 15.9 13.6 12.5*  HCT 50.4 42.9 39.5  MCV 89.0 89.0 88.0  PLT 116* 113* 115*   Cardiac Enzymes: No results for input(s): CKTOTAL, CKMB, CKMBINDEX, TROPONINI in the last 168 hours. BNP (last 3 results) Recent Labs    10/03/24 1202 10/10/24 1058 10/15/24 1935  PROBNP 20,348* 24,507.0* >35,000.0*   CBG: Recent Labs  Lab 10/17/24 1135 10/17/24 1704 10/17/24 2124 10/18/24 0844 10/18/24 1255  GLUCAP 133* 154* 79 160* 144*   D-Dimer: No results for input(s): DDIMER in the last 72 hours. Hgb A1c: No results for input(s): HGBA1C in the last 72 hours. Lipid Profile: No results for input(s): CHOL, HDL, LDLCALC, TRIG, CHOLHDL,  LDLDIRECT in the last 72 hours. Thyroid function studies: No results for input(s): TSH, T4TOTAL, T3FREE, THYROIDAB in the last 72 hours.  Invalid input(s): FREET3 Anemia work up: No results for input(s): VITAMINB12, FOLATE, FERRITIN, TIBC, IRON, RETICCTPCT in the last 72 hours. Sepsis Labs: Recent Labs  Lab 10/15/24 1935 10/15/24 2129 10/15/24 2344 10/16/24 0329 10/16/24 0530 10/17/24 0425 10/18/24 1033  WBC 5.6  --   --   --   --  4.8 4.6  LATICACIDVEN  --  3.5* 3.3* 2.8* 2.3*  --   --     Microbiology Recent Results (from the past 240 hours)  Resp panel by RT-PCR (RSV, Flu A&B, Covid) Anterior Nasal Swab     Status: None   Collection Time: 10/15/24  7:35 PM   Specimen: Anterior Nasal Swab  Result Value Ref Range Status   SARS Coronavirus 2 by RT PCR NEGATIVE NEGATIVE Final    Comment: (NOTE) SARS-CoV-2 target nucleic acids are NOT DETECTED.  The SARS-CoV-2 RNA is generally detectable in upper respiratory specimens during the acute phase of infection. The lowest concentration of SARS-CoV-2 viral copies this assay can detect is 138 copies/mL. A negative result does not preclude SARS-Cov-2 infection and should not be used as the sole basis for treatment or other patient management decisions. A negative result may occur with  improper specimen collection/handling, submission of specimen other than nasopharyngeal swab, presence of viral mutation(s) within the areas targeted by this assay, and inadequate number of viral copies(<138 copies/mL). A negative result  must be combined with clinical observations, patient history, and epidemiological information. The expected result is Negative.  Fact Sheet for Patients:  bloggercourse.com  Fact Sheet for Healthcare Providers:  seriousbroker.it  This test is no t yet approved or cleared by the [place]  FDA and  has been authorized for detection and/or  diagnosis of SARS-CoV-2 by FDA under an Emergency Use Authorization (EUA). This EUA will remain  in effect (meaning this test can be used) for the duration of the COVID-19 declaration under Section 564(b)(1) of the Act, 21 U.S.C.section 360bbb-3(b)(1), unless the authorization is terminated  or revoked sooner.       Influenza A by PCR NEGATIVE NEGATIVE Final   Influenza B by PCR NEGATIVE NEGATIVE Final    Comment: (NOTE) The Xpert Xpress SARS-CoV-2/FLU/RSV plus assay is intended as an aid in the diagnosis of influenza from Nasopharyngeal swab specimens and should not be used as a sole basis for treatment. Nasal washings and aspirates are unacceptable for Xpert Xpress SARS-CoV-2/FLU/RSV testing.  Fact Sheet for Patients: bloggercourse.com  Fact Sheet for Healthcare Providers: seriousbroker.it  This test is not yet approved or cleared by the [place]  FDA and has been authorized for detection and/or diagnosis of SARS-CoV-2 by FDA under an Emergency Use Authorization (EUA). This EUA will remain in effect (meaning this test can be used) for the duration of the COVID-19 declaration under Section 564(b)(1) of the Act, 21 U.S.C. section 360bbb-3(b)(1), unless the authorization is terminated or revoked.     Resp Syncytial Virus by PCR NEGATIVE NEGATIVE Final    Comment: (NOTE) Fact Sheet for Patients: bloggercourse.com  Fact Sheet for Healthcare Providers: seriousbroker.it  This test is not yet approved or cleared by the [place]  FDA and has been authorized for detection and/or diagnosis of SARS-CoV-2 by FDA under an Emergency Use Authorization (EUA). This EUA will remain in effect (meaning this test can be used) for the duration of the COVID-19 declaration under Section 564(b)(1) of the Act, 21 U.S.C. section 360bbb-3(b)(1), unless the authorization is terminated  or revoked.  Performed at [facility], [address] [place], [place] [address]   Blood Culture (routine x 2)     Status: None (Preliminary result)   Collection Time: 10/15/24  7:41 PM   Specimen: BLOOD  Result Value Ref Range Status   Specimen Description   Final    BLOOD BLOOD LEFT FOREARM Performed at [facility], [address] [place], [place] [address]    Special Requests   Final    BOTTLES DRAWN AEROBIC AND ANAEROBIC Blood Culture results may not be optimal due to an inadequate volume of blood received in culture bottles Performed at [facility], [address], [place], [place] [address]    Culture  Setup Time   Final    GRAM POSITIVE COCCI ANAEROBIC BOTTLE ONLY Organism ID to follow CRITICAL RESULT CALLED TO, READ BACK BY AND VERIFIED WITHBETHA CATHALEEN BLANCH AT 9664 10/18/24 JG Performed at [facility], [address] [place], [place] [address]    Culture   Final    CULTURE REINCUBATED FOR BETTER GROWTH Performed at [facility], [address], [place], [place] [address]    Report Status PENDING  Incomplete  Blood Culture (routine x 2)     Status: None (Preliminary result)   Collection Time: 10/15/24  7:41 PM   Specimen: BLOOD  Result Value Ref Range Status   Specimen Description BLOOD BLOOD RIGHT FOREARM  Final  Special Requests   Final    BOTTLES DRAWN AEROBIC AND ANAEROBIC Blood Culture results may not be optimal due to an inadequate volume of blood received in culture bottles   Culture  Setup Time   Final    GRAM NEGATIVE RODS AEROBIC BOTTLE ONLY Organism ID to follow CRITICAL RESULT CALLED TO, READ BACK BY AND VERIFIED WITH: WILL ANDERSON 10/17/24 0814 MW    Culture   Final    NO GROWTH 3 DAYS Performed at [facility], [address] [place], [place] [address]    Report Status PENDING  Incomplete  Blood Culture ID Panel (Reflexed)     Status: None   Collection Time: 10/15/24  7:41 PM   Result Value Ref Range Status   Enterococcus faecalis NOT DETECTED NOT DETECTED Final   Enterococcus Faecium NOT DETECTED NOT DETECTED Final   Listeria monocytogenes NOT DETECTED NOT DETECTED Final   Staphylococcus species NOT DETECTED NOT DETECTED Final   Staphylococcus aureus (BCID) NOT DETECTED NOT DETECTED Final   Staphylococcus epidermidis NOT DETECTED NOT DETECTED Final   Staphylococcus lugdunensis NOT DETECTED NOT DETECTED Final   Streptococcus species NOT DETECTED NOT DETECTED Final   Streptococcus agalactiae NOT DETECTED NOT DETECTED Final   Streptococcus pneumoniae NOT DETECTED NOT DETECTED Final   Streptococcus pyogenes NOT DETECTED NOT DETECTED Final   A.calcoaceticus-baumannii NOT DETECTED NOT DETECTED Final   Bacteroides fragilis NOT DETECTED NOT DETECTED Final   Enterobacterales NOT DETECTED NOT DETECTED Final   Enterobacter cloacae complex NOT DETECTED NOT DETECTED Final   Escherichia coli NOT DETECTED NOT DETECTED Final   Klebsiella aerogenes NOT DETECTED NOT DETECTED Final   Klebsiella oxytoca NOT DETECTED NOT DETECTED Final   Klebsiella pneumoniae NOT DETECTED NOT DETECTED Final   Proteus species NOT DETECTED NOT DETECTED Final   Salmonella species NOT DETECTED NOT DETECTED Final   Serratia marcescens NOT DETECTED NOT DETECTED Final   Haemophilus influenzae NOT DETECTED NOT DETECTED Final   Neisseria meningitidis NOT DETECTED NOT DETECTED Final   Pseudomonas aeruginosa NOT DETECTED NOT DETECTED Final   Stenotrophomonas maltophilia NOT DETECTED NOT DETECTED Final   Candida albicans NOT DETECTED NOT DETECTED Final   Candida auris NOT DETECTED NOT DETECTED Final   Candida glabrata NOT DETECTED NOT DETECTED Final   Candida krusei NOT DETECTED NOT DETECTED Final   Candida parapsilosis NOT DETECTED NOT DETECTED Final   Candida tropicalis NOT DETECTED NOT DETECTED Final   Cryptococcus neoformans/gattii NOT DETECTED NOT DETECTED Final    Comment: Performed at [facility], [address] [place], [place] [address]  Blood Culture ID Panel (Reflexed)     Status: Abnormal   Collection Time: 10/15/24  7:41 PM  Result Value Ref Range Status   Enterococcus faecalis NOT DETECTED NOT DETECTED Final   Enterococcus Faecium NOT DETECTED NOT DETECTED Final   Listeria monocytogenes NOT DETECTED NOT DETECTED Final   Staphylococcus species DETECTED (A) NOT DETECTED Final    Comment: CRITICAL RESULT CALLED TO, READ BACK BY AND VERIFIED WITH:  CATHALEEN PATEL AT 0335 10/18/24 JG    Staphylococcus aureus (BCID) NOT DETECTED NOT DETECTED Final   Staphylococcus epidermidis NOT DETECTED NOT DETECTED Final   Staphylococcus lugdunensis NOT DETECTED NOT DETECTED Final   Streptococcus species NOT DETECTED NOT DETECTED Final   Streptococcus agalactiae NOT DETECTED NOT DETECTED Final   Streptococcus pneumoniae NOT DETECTED NOT DETECTED Final   Streptococcus pyogenes NOT DETECTED NOT DETECTED Final   A.calcoaceticus-baumannii NOT DETECTED NOT DETECTED Final  Bacteroides fragilis NOT DETECTED NOT DETECTED Final   Enterobacterales NOT DETECTED NOT DETECTED Final   Enterobacter cloacae complex NOT DETECTED NOT DETECTED Final   Escherichia coli NOT DETECTED NOT DETECTED Final   Klebsiella aerogenes NOT DETECTED NOT DETECTED Final   Klebsiella oxytoca NOT DETECTED NOT DETECTED Final   Klebsiella pneumoniae NOT DETECTED NOT DETECTED Final   Proteus species NOT DETECTED NOT DETECTED Final   Salmonella species NOT DETECTED NOT DETECTED Final   Serratia marcescens NOT DETECTED NOT DETECTED Final   Haemophilus influenzae NOT DETECTED NOT DETECTED Final   Neisseria meningitidis NOT DETECTED NOT DETECTED Final   Pseudomonas aeruginosa NOT DETECTED NOT DETECTED Final   Stenotrophomonas maltophilia NOT DETECTED NOT DETECTED Final   Candida albicans NOT DETECTED NOT DETECTED Final   Candida auris NOT DETECTED NOT DETECTED Final   Candida glabrata NOT DETECTED NOT DETECTED Final    Candida krusei NOT DETECTED NOT DETECTED Final   Candida parapsilosis NOT DETECTED NOT DETECTED Final   Candida tropicalis NOT DETECTED NOT DETECTED Final   Cryptococcus neoformans/gattii NOT DETECTED NOT DETECTED Final    Comment: Performed at [facility], [address], [place], [place] [address]  Urine Culture     Status: Abnormal   Collection Time: 10/15/24  9:29 PM   Specimen: Urine, Random  Result Value Ref Range Status   Specimen Description   Final    URINE, RANDOM Performed at [facility], [address] [place], [place] [address]    Special Requests   Final    NONE Reflexed from [phone] Performed at [facility], [address] [place], [place] [address]    Culture >=100,000 COLONIES/mL ESCHERICHIA COLI (A)  Final   Report Status 10/18/2024 FINAL  Final   Organism ID, Bacteria ESCHERICHIA COLI (A)  Final      Susceptibility   Escherichia coli - MIC*    AMPICILLIN  >=32 RESISTANT Resistant     CEFAZOLIN  (URINE) Value in next row Sensitive      8 SENSITIVEThis is a modified FDA-approved test that has been validated and its performance characteristics determined by the reporting laboratory.  This laboratory is certified under the Clinical Laboratory Improvement Amendments CLIA as qualified to perform high complexity clinical laboratory testing.    CEFEPIME  Value in next row Sensitive      8 SENSITIVEThis is a modified FDA-approved test that has been validated and its performance characteristics determined by the reporting laboratory.  This laboratory is certified under the Clinical Laboratory Improvement Amendments CLIA as qualified to perform high complexity clinical laboratory testing.    ERTAPENEM Value in next row Sensitive      8 SENSITIVEThis is a modified FDA-approved test that has been validated and its performance characteristics determined by the reporting laboratory.  This laboratory is certified under the Clinical Laboratory  Improvement Amendments CLIA as qualified to perform high complexity clinical laboratory testing.    CEFTRIAXONE  Value in next row Sensitive      8 SENSITIVEThis is a modified FDA-approved test that has been validated and its performance characteristics determined by the reporting laboratory.  This laboratory is certified under the Clinical Laboratory Improvement Amendments CLIA as qualified to perform high complexity clinical laboratory testing.    CIPROFLOXACIN  Value in next row Resistant      8 SENSITIVEThis is a modified FDA-approved test that has been validated and its performance characteristics determined by the reporting laboratory.  This laboratory is certified under the Clinical Laboratory Improvement Amendments CLIA  as qualified to perform high complexity clinical laboratory testing.    GENTAMICIN  Value in next row Sensitive      8 SENSITIVEThis is a modified FDA-approved test that has been validated and its performance characteristics determined by the reporting laboratory.  This laboratory is certified under the Clinical Laboratory Improvement Amendments CLIA as qualified to perform high complexity clinical laboratory testing.    NITROFURANTOIN Value in next row Sensitive      8 SENSITIVEThis is a modified FDA-approved test that has been validated and its performance characteristics determined by the reporting laboratory.  This laboratory is certified under the Clinical Laboratory Improvement Amendments CLIA as qualified to perform high complexity clinical laboratory testing.    TRIMETH/SULFA Value in next row Resistant      8 SENSITIVEThis is a modified FDA-approved test that has been validated and its performance characteristics determined by the reporting laboratory.  This laboratory is certified under the Clinical Laboratory Improvement Amendments CLIA as qualified to perform high complexity clinical laboratory testing.    AMPICILLIN /SULBACTAM Value in next row Resistant      8  SENSITIVEThis is a modified FDA-approved test that has been validated and its performance characteristics determined by the reporting laboratory.  This laboratory is certified under the Clinical Laboratory Improvement Amendments CLIA as qualified to perform high complexity clinical laboratory testing.    PIP/TAZO Value in next row Sensitive      <=4 SENSITIVEThis is a modified FDA-approved test that has been validated and its performance characteristics determined by the reporting laboratory.  This laboratory is certified under the Clinical Laboratory Improvement Amendments CLIA as qualified to perform high complexity clinical laboratory testing.    MEROPENEM Value in next row Sensitive      <=4 SENSITIVEThis is a modified FDA-approved test that has been validated and its performance characteristics determined by the reporting laboratory.  This laboratory is certified under the Clinical Laboratory Improvement Amendments CLIA as qualified to perform high complexity clinical laboratory testing.    * >=100,000 COLONIES/mL ESCHERICHIA COLI    Procedures and diagnostic studies:  No results found.             LOS: 3 days   [name]  [facility]   Pager on [email or web address]. If 7PM-7AM, please contact night-coverage at [email or web address]     10/18/2024, 2:26 PM

## 2024-10-18 NOTE — Progress Notes (Signed)
 Heart Failure Navigator Progress Note  Assessed for Heart & Vascular TOC clinic readiness.  Patient is a Advanced Heart Failure Team patient.  Hospital Follow-up scheduled for 11/13/24 @ 8:30 AM.  Appointment added to discharge AVS and will give patient an appointment card.   Navigator will sign off at this time.  Charmaine Pines, RN, BSN [facility] Heart Failure Navigator Secure Chat Only

## 2024-10-18 NOTE — Consult Note (Signed)
 Pharmacy Antibiotic Note  [name] is a 89 y.o. male admitted on 10/15/2024 with bacteremia.  Pharmacy has been consulted for vancomycin  dosing.  First set growing 1 bottle GNR and negative BCID Second set growing 1 bottle GPC and BCID positive for staph sp.   Ucx growing ecoli.   Pt is currently on abx for UTI and proctitis   Afeb and WBC WNL. Lactic acid trending up.   Plan: Day 3 of abx.  Continue cefepime  2 g daily  Continue flagyl  500 mg BID  Will give vancomycin  2000 mg x 1 followed by vancomycin  1500 mg q48H. Predicted AUC of 514. Goal AUC of 400-550. Vd 0.72. Scr 2.72 (close to baseline), IBW. Plan to order levels after 3rd dose.   Height: (P) 5' 8 (172.7 cm) Weight: (P) 102.5 kg (225 lb 15.5 oz) IBW/kg (Calculated) : (P) 68.4  Temp (24hrs), Avg:97.7 F (36.5 C), Min:97.4 F (36.3 C), Max:97.9 F (36.6 C)  Recent Labs  Lab 10/15/24 1935 10/15/24 2129 10/15/24 2344 10/16/24 0329 10/16/24 0530 10/17/24 0425  WBC 5.6  --   --   --   --  4.8  CREATININE 2.87*  --   --   --   --  2.72*  LATICACIDVEN  --  3.5* 3.3* 2.8* 2.3*  --     Estimated Creatinine Clearance: 21.8 mL/min (A) (by C-G formula based on SCr of 2.72 mg/dL (H)).    Allergies[1]  Antimicrobials this admission: 12/11 cefepime  >>  12/11 flagyl  >>  12/11 vancomycin  >>   Dose adjustments this admission: None   Microbiology results: 12/8 BCx: see above.  12/8 UCx: ecoli.     Thank you for allowing pharmacy to be a part of this patients care.  Cathaleen GORMAN Blanch, PharmD, BCPS 10/18/2024 3:54 AM     [1]  Allergies Allergen Reactions   Angiotensin Receptor Blockers     hyperkalemia   Metformin Diarrhea

## 2024-10-18 NOTE — Plan of Care (Signed)

## 2024-10-18 NOTE — Consult Note (Signed)
 PHARMACY - PHYSICIAN COMMUNICATION CRITICAL VALUE ALERT - BLOOD CULTURE IDENTIFICATION (BCID)  [name] is an 89 y.o. male who presented to [facility] on 10/15/2024 with a chief complaint of SOB/UTI  Assessment:  12/8 blood culture with GNR in 1 of 4 bottles, BCID did not detect any organisms. On antibiotics currently for UTI and proctitis. Lab called again and second set in 1 bottle now growing GPC. BCID positive for staph sp.   Name of physician (or Provider) Contacted: Mansy  Current antibiotics: cefepime  and flagyl .   Changes to prescribed antibiotics recommended: Recommended to not escalate therapy as, this is possibly a contaminant and pt seems stable on current regimen. If need to cover, recommended to start vancomycin .   MD wants to start vancomycin  and have AM team follow up.   Results for orders placed or performed during the hospital encounter of 10/15/24  Blood Culture ID Panel (Reflexed) (Collected: 10/15/2024  7:41 PM)  Result Value Ref Range   Enterococcus faecalis NOT DETECTED NOT DETECTED   Enterococcus Faecium NOT DETECTED NOT DETECTED   Listeria monocytogenes NOT DETECTED NOT DETECTED   Staphylococcus species DETECTED (A) NOT DETECTED   Staphylococcus aureus (BCID) NOT DETECTED NOT DETECTED   Staphylococcus epidermidis NOT DETECTED NOT DETECTED   Staphylococcus lugdunensis NOT DETECTED NOT DETECTED   Streptococcus species NOT DETECTED NOT DETECTED   Streptococcus agalactiae NOT DETECTED NOT DETECTED   Streptococcus pneumoniae NOT DETECTED NOT DETECTED   Streptococcus pyogenes NOT DETECTED NOT DETECTED   A.calcoaceticus-baumannii NOT DETECTED NOT DETECTED   Bacteroides fragilis NOT DETECTED NOT DETECTED   Enterobacterales NOT DETECTED NOT DETECTED   Enterobacter cloacae complex NOT DETECTED NOT DETECTED   Escherichia coli NOT DETECTED NOT DETECTED   Klebsiella aerogenes NOT DETECTED NOT DETECTED   Klebsiella oxytoca NOT DETECTED NOT DETECTED   Klebsiella  pneumoniae NOT DETECTED NOT DETECTED   Proteus species NOT DETECTED NOT DETECTED   Salmonella species NOT DETECTED NOT DETECTED   Serratia marcescens NOT DETECTED NOT DETECTED   Haemophilus influenzae NOT DETECTED NOT DETECTED   Neisseria meningitidis NOT DETECTED NOT DETECTED   Pseudomonas aeruginosa NOT DETECTED NOT DETECTED   Stenotrophomonas maltophilia NOT DETECTED NOT DETECTED   Candida albicans NOT DETECTED NOT DETECTED   Candida auris NOT DETECTED NOT DETECTED   Candida glabrata NOT DETECTED NOT DETECTED   Candida krusei NOT DETECTED NOT DETECTED   Candida parapsilosis NOT DETECTED NOT DETECTED   Candida tropicalis NOT DETECTED NOT DETECTED   Cryptococcus neoformans/gattii NOT DETECTED NOT DETECTED    Cathaleen GORMAN Blanch 10/18/2024  3:45 AM

## 2024-10-19 LAB — GLUCOSE, CAPILLARY
Glucose-Capillary: 142 mg/dL — ABNORMAL HIGH (ref 70–99)
Glucose-Capillary: 152 mg/dL — ABNORMAL HIGH (ref 70–99)
Glucose-Capillary: 169 mg/dL — ABNORMAL HIGH (ref 70–99)
Glucose-Capillary: 165 mg/dL — ABNORMAL HIGH (ref 70–99)
Glucose-Capillary: 88 mg/dL (ref 70–99)

## 2024-10-19 LAB — RENAL FUNCTION PANEL
Albumin: 3.2 g/dL — ABNORMAL LOW (ref 3.5–5.0)
Anion gap: 10 (ref 5–15)
BUN: 66 mg/dL — ABNORMAL HIGH (ref 8–23)
CO2: 29 mmol/L (ref 22–32)
Calcium: 7.7 mg/dL — ABNORMAL LOW (ref 8.9–10.3)
Chloride: 100 mmol/L (ref 98–111)
Creatinine, Ser: 2.61 mg/dL — ABNORMAL HIGH (ref 0.61–1.24)
GFR, Estimated: 23 mL/min — ABNORMAL LOW (ref >60)
Glucose, Bld: 155 mg/dL — ABNORMAL HIGH (ref 70–99)
Phosphorus: 3.8 mg/dL (ref 2.5–4.6)
Potassium: 4.3 mmol/L (ref 3.5–5.1)
Sodium: 140 mmol/L (ref 135–145)

## 2024-10-19 MED ORDER — OXYCODONE HCL 5 MG PO TABS
5.0000 mg | ORAL_TABLET | ORAL | Status: DC | PRN
  Administered 2024-10-22: 18:00:00 5 mg via ORAL
  Filled 2024-10-19 (×2): qty 1

## 2024-10-19 MED ORDER — MORPHINE SULFATE (PF) 2 MG/ML IV SOLN
2.0000 mg | INTRAVENOUS | Status: DC | PRN
  Administered 2024-10-19 – 2024-10-21 (×4): 2 mg via INTRAVENOUS
  Filled 2024-10-19 (×4): qty 1

## 2024-10-19 MED ORDER — VANCOMYCIN HCL 1250 MG/250ML IV SOLN: 1250.0000 mg | INTRAVENOUS | Status: DC

## 2024-10-19 MED ORDER — TORSEMIDE 20 MG PO TABS
40.0000 mg | ORAL_TABLET | Freq: Every day | ORAL | Status: DC
  Administered 2024-10-19 – 2024-10-23 (×5): 40 mg via ORAL
  Filled 2024-10-19 (×5): qty 2

## 2024-10-19 MED ORDER — FUROSEMIDE 10 MG/ML IJ SOLN
60.0000 mg | Freq: Once | INTRAMUSCULAR | Status: AC
  Administered 2024-10-19: 60 mg via INTRAVENOUS
  Filled 2024-10-19: qty 6

## 2024-10-19 NOTE — TOC Progression Note (Signed)
 Transition of Care [facility]) - Progression Note    Patient Details  Name: [name] MRN: [id number] Date of Birth: 06-26-1935  [facility] CM/SW Contact  Lauraine JAYSON Carpen, LCSW Phone Number: 10/19/2024, 12:21 PM  Clinical Narrative:   PT and OT recommended home health. Patient is not interested in this service. CSW encouraged him to notify team if he changes his mind prior to discharge or his PCP if he changes his mind after discharge. Patient declined 3-in-1. His wife and daughter will transport him home at discharge.  Expected Discharge Plan and Services                                               Social Drivers of Health (SDOH) Interventions SDOH Screenings   Food Insecurity: No Food Insecurity (10/16/2024)  Housing: Low Risk (10/17/2024)  Transportation Needs: No Transportation Needs (10/17/2024)  Utilities: Not At Risk (10/16/2024)  Depression (PHQ2-9): Low Risk (04/07/2022)  Financial Resource Strain: Low Risk (09/21/2024)   Received from [facility]  Social Connections: Moderately Integrated (10/16/2024)  Tobacco Use: Medium Risk (10/17/2024)    Readmission Risk Interventions    10/17/2024   10:32 AM 03/26/2024    4:09 PM 03/10/2023   12:47 PM  Readmission Risk Prevention Plan  Transportation Screening Complete Complete Complete  PCP or Specialist Appt within 3-5 Days   Complete  Social Work Consult for Recovery Care Planning/Counseling   Complete  Palliative Care Screening   Not Applicable  Medication Review Oceanographer) Complete Complete Complete  PCP or Specialist appointment within 3-5 days of discharge Complete Complete   HRI or Home Care Consult Complete Complete   SW Recovery Care/Counseling Consult Complete Complete   Palliative Care Screening Not Applicable Not Applicable   Skilled Nursing Facility Not Applicable Not Applicable

## 2024-10-19 NOTE — Consult Note (Signed)
 Pharmacy Antibiotic Note  [name] is a 89 y.o. male admitted on 10/15/2024 with bacteremia.  Pharmacy has been consulted for vancomycin  dosing.  First set growing 1 bottle GNR and negative BCID Second set growing 1 bottle GPC and BCID positive for staph sp.   Ucx growing ecoli. Sensitive to ancef .   Pt is currently on abx for UTI and proctitis   Afeb and WBC WNL. Lactic acid trending down.   Plan: Day 5 of cefepime  and flagyl . Day 2 of vancomycin .  Continue cefepime  2 g daily  Continue flagyl  500 mg BID  Patient received vancomycin  2000 mg x 1 loading dose. Will adjust vancomycin  1500 mg q48H to 1250 mg q48H. Increase in body weight and improvement in Scr. Predicted AUC of 568. Goal AUC of 400-600. Vd 0.5, Scr 2.62 (at baseline), IBW. Plan to order level after 3rd dose.    Height: (P) 5' 8 (172.7 cm) Weight: 107.8 kg (237 lb 10.5 oz) IBW/kg (Calculated) : (P) 68.4  Temp (24hrs), Avg:97.7 F (36.5 C), Min:97.5 F (36.4 C), Max:98 F (36.7 C)  Recent Labs  Lab 10/15/24 1935 10/15/24 2129 10/15/24 2344 10/16/24 0329 10/16/24 0530 10/17/24 0425 10/18/24 1033 10/19/24 0431  WBC 5.6  --   --   --   --  4.8 4.6  --   CREATININE 2.87*  --   --   --   --  2.72* 2.67* 2.61*  LATICACIDVEN  --  3.5* 3.3* 2.8* 2.3*  --   --   --     Estimated Creatinine Clearance: 23.7 mL/min (A) (by C-G formula based on SCr of 2.61 mg/dL (H)).    Allergies[1]  Antimicrobials this admission: 12/11 cefepime  >>  12/11 flagyl  >>  12/11 vancomycin  >>   Dose adjustments this admission: None   Microbiology results: 12/8 BCx: see above.  12/8 UCx: ecoli.     Thank you for allowing pharmacy to be a part of this patients care.  Cathaleen GORMAN Blanch, PharmD, BCPS 10/19/2024 7:39 AM      [1]  Allergies Allergen Reactions   Angiotensin Receptor Blockers     hyperkalemia   Metformin Diarrhea

## 2024-10-19 NOTE — Progress Notes (Signed)
 Progress Note    [name]  FMW:[id number] DOB: 06-27-1935  DOA: 10/15/2024 PCP: [name], MD      Brief Narrative:    Medical records reviewed and are as summarized below:  [name] is a 89 y.o. male with medical history significant of PAF on Eliquis , sCHF with EF 35-40, HTN, HLD, DM, GERD, CKD-4, CAD, s/p of stent, CABG, cardiac arrest, complete heart block, s/p of pacemaker, GIB, who presents with shortness of breath.  He was found to have pro-BNP > 35,000, rop 124 --> 114, lactic acid 3.5, WBC 5.6, renal functions worse than baseline, negative PCR for COVID, flu and RSV, UA (cloudy appearance, large amount of leukocyte, moderate amount of leukocyte, WBC> 50).  Temperature 97.3, blood pressure 128/104, heart rate 72, RR 14.  CT of head negative for acute intracranial abnormalities.     Patient is admitted to PCU as inpatient for management of acute on chronic systolic CHF.         Assessment/Plan:   Principal Problem:   Acute on chronic systolic CHF (congestive heart failure) (HCC) Active Problems:   Acute respiratory failure with hypoxia (HCC)   UTI (urinary tract infection)   Proctitis   CAD, s/p CABG x 5   Myocardial injury   HTN (hypertension)   HLD (hyperlipidemia)   Type II diabetes mellitus with renal manifestations (HCC)   Paroxysmal atrial fibrillation (HCC)   Elevated lactic acid level   Thrombocytopenia   CKD (chronic kidney disease), stage IV (HCC)   Hypothermia   Obesity (BMI 30-39.9)     Body mass index is 36.14 kg/m (pended).   Acute on chronic hypoxic respiratory failure: Continue 2 L/min oxygen via [place].  He said he usually uses oxygen at night.    Acute on chronic HFrEF: S/p treatment with IV Lasix .  Give additional dose of IV Lasix  today.  Switch oral Lasix  to torsemide  (home dose).   Continue Aldactone , Entresto  and carvedilol .   2D echo in October 2025 showed EF estimated at 25 to 30%.   Lactic acidosis: Probably  from severe sepsis Acute E. coli UTI: Urine culture showed E. coli.  Continue IV cefepime  Gram-negative rod bacteremia: 1 out of 4 bottles positive for gram-negative rods.  Follow-up blood culture and sensitivity report. Staph capitis bacteremia: 1 out of 4 bottles positive for Staph capitis likely a contaminant.  Discontinue IV vancomycin . Repeat blood cultures are pending.   Proctitis: Continue broad-spectrum antibiotics   Hypothermia: Resolved   Type II DM, hypoglycemic episodes: Lantus  was discontinued on 10/16/2024.  Use NovoLog  as needed for hyperglycemia.   Paroxysmal atrial fibrillation: Continue Eliquis  and carvedilol    General weakness: PT and OT evaluation   Comorbidities include CKD stage IV, hypertension, CAD s/p CABG, dyslipidemia, thrombocytopenia    Diet Order             DIET DYS 2 Fluid consistency: Thin  Diet effective now                                  Consultants: None  Procedures: None    Medications:    apixaban   2.5 mg Oral BID   carvedilol   12.5 mg Oral BID WC   insulin  aspart  0-5 Units Subcutaneous QHS   insulin  aspart  0-9 Units Subcutaneous TID WC   metroNIDAZOLE   500 mg Oral Q12H   pantoprazole   40 mg Oral BID  rosuvastatin   40 mg Oral Daily   sacubitril -valsartan   1 tablet Oral BID   spironolactone   25 mg Oral Daily   tamsulosin   0.4 mg Oral Daily   torsemide   40 mg Oral Daily   Continuous Infusions:  ceFEPime  (MAXIPIME ) IV 2 g (10/19/24 0908)     Anti-infectives (From admission, onward)    Start     Dose/Rate Route Frequency Ordered Stop   10/20/24 0500  vancomycin  (VANCOREADY) IVPB 1500 mg/300 mL  Status:  Discontinued       Placed in Followed by Linked Group   1,500 mg 150 mL/hr over 120 Minutes Intravenous Every 48 hours 10/18/24 0353 10/19/24 0739   10/20/24 0500  vancomycin  (VANCOREADY) IVPB 1250 mg/250 mL  Status:  Discontinued       Placed in Followed by Linked Group   1,250 mg 166.7  mL/hr over 90 Minutes Intravenous Every 48 hours 10/19/24 0739 10/19/24 1347   10/18/24 0500  vancomycin  (VANCOREADY) IVPB 2000 mg/400 mL       Placed in Followed by Linked Group   2,000 mg 200 mL/hr over 120 Minutes Intravenous  Once 10/18/24 0353 10/18/24 0708   10/17/24 1000  metroNIDAZOLE  (FLAGYL ) tablet 500 mg        500 mg Oral Every 12 hours 10/17/24 0747     10/17/24 1000  ceFEPIme  (MAXIPIME ) 2 g in sodium chloride  0.9 % 100 mL IVPB        2 g 200 mL/hr over 30 Minutes Intravenous Every 24 hours 10/17/24 0829     10/16/24 2000  cefTRIAXone  (ROCEPHIN ) 2 g in sodium chloride  0.9 % 100 mL IVPB  Status:  Discontinued        2 g 200 mL/hr over 30 Minutes Intravenous Every 24 hours 10/16/24 0004 10/17/24 0829   10/16/24 0015  metroNIDAZOLE  (FLAGYL ) IVPB 500 mg  Status:  Discontinued        500 mg 100 mL/hr over 60 Minutes Intravenous Every 12 hours 10/16/24 0004 10/17/24 0747   10/15/24 2015  cefTRIAXone  (ROCEPHIN ) 2 g in sodium chloride  0.9 % 100 mL IVPB        2 g 200 mL/hr over 30 Minutes Intravenous Once 10/15/24 2011 10/15/24 2123   10/15/24 2015  azithromycin  (ZITHROMAX ) 500 mg in sodium chloride  0.9 % 250 mL IVPB        500 mg 250 mL/hr over 60 Minutes Intravenous  Once 10/15/24 2011 10/15/24 2202              Family Communication/Anticipated D/C date and plan/Code Status   DVT prophylaxis: apixaban  (ELIQUIS ) tablet 2.5 mg Start: 10/16/24 0115 apixaban  (ELIQUIS ) tablet 2.5 mg     Code Status: Full Code  Family Communication: None Disposition Plan: Plan to discharge home   Status is: Inpatient Remains inpatient appropriate because: CHF, sepsis from UTI, possible bacteremia       Subjective:   Interval events noted.  No new complaints.  No shortness of breath or chest pain.  Objective:    Vitals:   10/19/24 0453 10/19/24 0500 10/19/24 0743 10/19/24 1214  BP: (!) 145/79  125/75 101/61  Pulse: 70  69 65  Resp: 20  18 18   Temp: 97.7 F (36.5 C)   97.8 F (36.6 C) 98.3 F (36.8 C)  TempSrc:      SpO2: 100%  100% 96%  Weight:  107.8 kg    Height:       No data found.   Intake/Output Summary (Last 24  hours) at 10/19/2024 1416 Last data filed at 10/19/2024 1010 Gross per 24 hour  Intake 720 ml  Output 300 ml  Net 420 ml   Filed Weights   10/16/24 2337 10/18/24 0511 10/19/24 0500  Weight: (P) 102.5 kg 102.5 kg 107.8 kg    Exam:   GEN: NAD SKIN: Warm and dry EYES: No pallor or icterus ENT: MMM CV: RRR PULM: CTA B ABD: soft, obese/distended, NT, +BS CNS: AAO x 3, non focal EXT: Bilateral lower extremity edema from the thighs to the feet.  No erythema or tenderness       Data Reviewed:   I have personally reviewed following labs and imaging studies:  Labs: Labs show the following:   Basic Metabolic Panel: Recent Labs  Lab 10/15/24 1935 10/17/24 0425 10/18/24 1033 10/19/24 0431  NA 144 143 139 140  K 3.6 3.5 4.1 4.3  CL 100 102 104 100  CO2 25 28 25 29   GLUCOSE 125* 111* 141* 155*  BUN 72* 69* 66* 66*  CREATININE 2.87* 2.72* 2.67* 2.61*  CALCIUM  8.5* 7.3* 7.2* 7.7*  PHOS  --   --   --  3.8   GFR Estimated Creatinine Clearance: 23.7 mL/min (A) (by C-G formula based on SCr of 2.61 mg/dL (H)). Liver Function Tests: Recent Labs  Lab 10/15/24 1935 10/19/24 0431  AST 29  --   ALT 8  --   ALKPHOS 85  --   BILITOT 1.0  --   PROT 8.3*  --   ALBUMIN  4.4 3.2*   No results for input(s): LIPASE, AMYLASE in the last 168 hours. No results for input(s): AMMONIA in the last 168 hours. Coagulation profile No results for input(s): INR, PROTIME in the last 168 hours.  CBC: Recent Labs  Lab 10/15/24 1935 10/17/24 0425 10/18/24 1033  WBC 5.6 4.8 4.6  NEUTROABS 4.0 3.0  --   HGB 15.9 13.6 12.5*  HCT 50.4 42.9 39.5  MCV 89.0 89.0 88.0  PLT 116* 113* 115*   Cardiac Enzymes: No results for input(s): CKTOTAL, CKMB, CKMBINDEX, TROPONINI in the last 168 hours. BNP (last 3  results) Recent Labs    10/03/24 1202 10/10/24 1058 10/15/24 1935  PROBNP 20,348* 24,507.0* >35,000.0*   CBG: Recent Labs  Lab 10/18/24 2250 10/18/24 2327 10/19/24 0355 10/19/24 0744 10/19/24 1215  GLUCAP 50* 143* 142* 152* 169*   D-Dimer: No results for input(s): DDIMER in the last 72 hours. Hgb A1c: No results for input(s): HGBA1C in the last 72 hours. Lipid Profile: No results for input(s): CHOL, HDL, LDLCALC, TRIG, CHOLHDL, LDLDIRECT in the last 72 hours. Thyroid function studies: No results for input(s): TSH, T4TOTAL, T3FREE, THYROIDAB in the last 72 hours.  Invalid input(s): FREET3 Anemia work up: No results for input(s): VITAMINB12, FOLATE, FERRITIN, TIBC, IRON, RETICCTPCT in the last 72 hours. Sepsis Labs: Recent Labs  Lab 10/15/24 1935 10/15/24 2129 10/15/24 2344 10/16/24 0329 10/16/24 0530 10/17/24 0425 10/18/24 1033  WBC 5.6  --   --   --   --  4.8 4.6  LATICACIDVEN  --  3.5* 3.3* 2.8* 2.3*  --   --     Microbiology Recent Results (from the past 240 hours)  Resp panel by RT-PCR (RSV, Flu A&B, Covid) Anterior Nasal Swab     Status: None   Collection Time: 10/15/24  7:35 PM   Specimen: Anterior Nasal Swab  Result Value Ref Range Status   SARS Coronavirus 2 by RT PCR NEGATIVE NEGATIVE Final  Comment: (NOTE) SARS-CoV-2 target nucleic acids are NOT DETECTED.  The SARS-CoV-2 RNA is generally detectable in upper respiratory specimens during the acute phase of infection. The lowest concentration of SARS-CoV-2 viral copies this assay can detect is 138 copies/mL. A negative result does not preclude SARS-Cov-2 infection and should not be used as the sole basis for treatment or other patient management decisions. A negative result may occur with  improper specimen collection/handling, submission of specimen other than nasopharyngeal swab, presence of viral mutation(s) within the areas targeted by this assay, and  inadequate number of viral copies(<138 copies/mL). A negative result must be combined with clinical observations, patient history, and epidemiological information. The expected result is Negative.  Fact Sheet for Patients:  bloggercourse.com  Fact Sheet for Healthcare Providers:  seriousbroker.it  This test is no t yet approved or cleared by the [place]  FDA and  has been authorized for detection and/or diagnosis of SARS-CoV-2 by FDA under an Emergency Use Authorization (EUA). This EUA will remain  in effect (meaning this test can be used) for the duration of the COVID-19 declaration under Section 564(b)(1) of the Act, 21 U.S.C.section 360bbb-3(b)(1), unless the authorization is terminated  or revoked sooner.       Influenza A by PCR NEGATIVE NEGATIVE Final   Influenza B by PCR NEGATIVE NEGATIVE Final    Comment: (NOTE) The Xpert Xpress SARS-CoV-2/FLU/RSV plus assay is intended as an aid in the diagnosis of influenza from Nasopharyngeal swab specimens and should not be used as a sole basis for treatment. Nasal washings and aspirates are unacceptable for Xpert Xpress SARS-CoV-2/FLU/RSV testing.  Fact Sheet for Patients: bloggercourse.com  Fact Sheet for Healthcare Providers: seriousbroker.it  This test is not yet approved or cleared by the [place]  FDA and has been authorized for detection and/or diagnosis of SARS-CoV-2 by FDA under an Emergency Use Authorization (EUA). This EUA will remain in effect (meaning this test can be used) for the duration of the COVID-19 declaration under Section 564(b)(1) of the Act, 21 U.S.C. section 360bbb-3(b)(1), unless the authorization is terminated or revoked.     Resp Syncytial Virus by PCR NEGATIVE NEGATIVE Final    Comment: (NOTE) Fact Sheet for Patients: bloggercourse.com  Fact Sheet for Healthcare  Providers: seriousbroker.it  This test is not yet approved or cleared by the [place]  FDA and has been authorized for detection and/or diagnosis of SARS-CoV-2 by FDA under an Emergency Use Authorization (EUA). This EUA will remain in effect (meaning this test can be used) for the duration of the COVID-19 declaration under Section 564(b)(1) of the Act, 21 U.S.C. section 360bbb-3(b)(1), unless the authorization is terminated or revoked.  Performed at [facility], [address] [place], [place] [address]   Blood Culture (routine x 2)     Status: Abnormal (Preliminary result)   Collection Time: 10/15/24  7:41 PM   Specimen: BLOOD  Result Value Ref Range Status   Specimen Description   Final    BLOOD BLOOD LEFT FOREARM Performed at [facility], [address] [place], [place] [address]    Special Requests   Final    BOTTLES DRAWN AEROBIC AND ANAEROBIC Blood Culture results may not be optimal due to an inadequate volume of blood received in culture bottles Performed at [facility], [address], [place], [place] [address]    Culture  Setup Time   Final    GRAM POSITIVE COCCI ANAEROBIC BOTTLE ONLY CRITICAL RESULT CALLED TO, READ BACK BY AND VERIFIED  WITHBETHA SERVER PATEL AT 9664 10/18/24 JG    Culture (A)  Final    STAPHYLOCOCCUS CAPITIS THE SIGNIFICANCE OF ISOLATING THIS ORGANISM FROM A SINGLE SET OF BLOOD CULTURES WHEN MULTIPLE SETS ARE DRAWN IS UNCERTAIN. PLEASE NOTIFY THE MICROBIOLOGY DEPARTMENT WITHIN ONE WEEK IF SPECIATION AND SENSITIVITIES ARE REQUIRED. Performed at [facility], [address], [place], [place] [address]    Report Status PENDING  Incomplete  Blood Culture (routine x 2)     Status: None (Preliminary result)   Collection Time: 10/15/24  7:41 PM   Specimen: BLOOD  Result Value Ref Range Status   Specimen Description   Final    BLOOD BLOOD RIGHT FOREARM Performed at [facility],  [address] [place], [place] [address]    Special Requests   Final    BOTTLES DRAWN AEROBIC AND ANAEROBIC Blood Culture results may not be optimal due to an inadequate volume of blood received in culture bottles Performed at [facility], [address] [place], [place] [address]    Culture  Setup Time   Final    GRAM NEGATIVE RODS AEROBIC BOTTLE ONLY Organism ID to follow CRITICAL RESULT CALLED TO, READ BACK BY AND VERIFIED WITH: WILL LENON 10/17/24 0814 MW Performed at [facility], [address], [place], [place] [address]    Culture   Final    CULTURE REINCUBATED FOR BETTER GROWTH Performed at [facility], [address], [place], [place] [address]    Report Status PENDING  Incomplete  Blood Culture ID Panel (Reflexed)     Status: None   Collection Time: 10/15/24  7:41 PM  Result Value Ref Range Status   Enterococcus faecalis NOT DETECTED NOT DETECTED Final   Enterococcus Faecium NOT DETECTED NOT DETECTED Final   Listeria monocytogenes NOT DETECTED NOT DETECTED Final   Staphylococcus species NOT DETECTED NOT DETECTED Final   Staphylococcus aureus (BCID) NOT DETECTED NOT DETECTED Final   Staphylococcus epidermidis NOT DETECTED NOT DETECTED Final   Staphylococcus lugdunensis NOT DETECTED NOT DETECTED Final   Streptococcus species NOT DETECTED NOT DETECTED Final   Streptococcus agalactiae NOT DETECTED NOT DETECTED Final   Streptococcus pneumoniae NOT DETECTED NOT DETECTED Final   Streptococcus pyogenes NOT DETECTED NOT DETECTED Final   A.calcoaceticus-baumannii NOT DETECTED NOT DETECTED Final   Bacteroides fragilis NOT DETECTED NOT DETECTED Final   Enterobacterales NOT DETECTED NOT DETECTED Final   Enterobacter cloacae complex NOT DETECTED NOT DETECTED Final   Escherichia coli NOT DETECTED NOT DETECTED Final   Klebsiella aerogenes NOT DETECTED NOT DETECTED Final   Klebsiella oxytoca NOT DETECTED NOT DETECTED Final   Klebsiella pneumoniae  NOT DETECTED NOT DETECTED Final   Proteus species NOT DETECTED NOT DETECTED Final   Salmonella species NOT DETECTED NOT DETECTED Final   Serratia marcescens NOT DETECTED NOT DETECTED Final   Haemophilus influenzae NOT DETECTED NOT DETECTED Final   Neisseria meningitidis NOT DETECTED NOT DETECTED Final   Pseudomonas aeruginosa NOT DETECTED NOT DETECTED Final   Stenotrophomonas maltophilia NOT DETECTED NOT DETECTED Final   Candida albicans NOT DETECTED NOT DETECTED Final   Candida auris NOT DETECTED NOT DETECTED Final   Candida glabrata NOT DETECTED NOT DETECTED Final   Candida krusei NOT DETECTED NOT DETECTED Final   Candida parapsilosis NOT DETECTED NOT DETECTED Final   Candida tropicalis NOT DETECTED NOT DETECTED Final   Cryptococcus neoformans/gattii NOT DETECTED NOT DETECTED Final    Comment: Performed at [facility], [address] [place], [place] [address]  Blood Culture ID Panel (Reflexed)     Status: Abnormal   Collection Time: 10/15/24  7:41 PM  Result Value Ref Range Status   Enterococcus faecalis NOT DETECTED NOT DETECTED Final   Enterococcus Faecium NOT DETECTED NOT DETECTED Final   Listeria monocytogenes NOT DETECTED NOT DETECTED Final   Staphylococcus species DETECTED (A) NOT DETECTED Final    Comment: CRITICAL RESULT CALLED TO, READ BACK BY AND VERIFIED WITH:  CATHALEEN BLANCH AT 0335 10/18/24 JG    Staphylococcus aureus (BCID) NOT DETECTED NOT DETECTED Final   Staphylococcus epidermidis NOT DETECTED NOT DETECTED Final   Staphylococcus lugdunensis NOT DETECTED NOT DETECTED Final   Streptococcus species NOT DETECTED NOT DETECTED Final   Streptococcus agalactiae NOT DETECTED NOT DETECTED Final   Streptococcus pneumoniae NOT DETECTED NOT DETECTED Final   Streptococcus pyogenes NOT DETECTED NOT DETECTED Final   A.calcoaceticus-baumannii NOT DETECTED NOT DETECTED Final   Bacteroides fragilis NOT DETECTED NOT DETECTED Final   Enterobacterales NOT DETECTED NOT  DETECTED Final   Enterobacter cloacae complex NOT DETECTED NOT DETECTED Final   Escherichia coli NOT DETECTED NOT DETECTED Final   Klebsiella aerogenes NOT DETECTED NOT DETECTED Final   Klebsiella oxytoca NOT DETECTED NOT DETECTED Final   Klebsiella pneumoniae NOT DETECTED NOT DETECTED Final   Proteus species NOT DETECTED NOT DETECTED Final   Salmonella species NOT DETECTED NOT DETECTED Final   Serratia marcescens NOT DETECTED NOT DETECTED Final   Haemophilus influenzae NOT DETECTED NOT DETECTED Final   Neisseria meningitidis NOT DETECTED NOT DETECTED Final   Pseudomonas aeruginosa NOT DETECTED NOT DETECTED Final   Stenotrophomonas maltophilia NOT DETECTED NOT DETECTED Final   Candida albicans NOT DETECTED NOT DETECTED Final   Candida auris NOT DETECTED NOT DETECTED Final   Candida glabrata NOT DETECTED NOT DETECTED Final   Candida krusei NOT DETECTED NOT DETECTED Final   Candida parapsilosis NOT DETECTED NOT DETECTED Final   Candida tropicalis NOT DETECTED NOT DETECTED Final   Cryptococcus neoformans/gattii NOT DETECTED NOT DETECTED Final    Comment: Performed at [facility], [address], [place], [place] [address]  Urine Culture     Status: Abnormal   Collection Time: 10/15/24  9:29 PM   Specimen: Urine, Random  Result Value Ref Range Status   Specimen Description   Final    URINE, RANDOM Performed at [facility], [address] [place], [place] [address]    Special Requests   Final    NONE Reflexed from [id number] Performed at [facility], [address] [place], [place] [address]    Culture >=100,000 COLONIES/mL ESCHERICHIA COLI (A)  Final   Report Status 10/18/2024 FINAL  Final   Organism ID, Bacteria ESCHERICHIA COLI (A)  Final      Susceptibility   Escherichia coli - MIC*    AMPICILLIN  >=32 RESISTANT Resistant     CEFAZOLIN  (URINE) Value in next row Sensitive      8 SENSITIVEThis is a modified FDA-approved test that has been validated  and its performance characteristics determined by the reporting laboratory.  This laboratory is certified under the Clinical Laboratory Improvement Amendments CLIA as qualified to perform high complexity clinical laboratory testing.    CEFEPIME  Value in next row Sensitive      8 SENSITIVEThis is a modified FDA-approved test that has been validated and its performance characteristics determined by the reporting laboratory.  This laboratory is certified under the Clinical Laboratory Improvement Amendments CLIA as  qualified to perform high complexity clinical laboratory testing.    ERTAPENEM Value in next row Sensitive      8 SENSITIVEThis is a modified FDA-approved test that has been validated and its performance characteristics determined by the reporting laboratory.  This laboratory is certified under the Clinical Laboratory Improvement Amendments CLIA as qualified to perform high complexity clinical laboratory testing.    CEFTRIAXONE  Value in next row Sensitive      8 SENSITIVEThis is a modified FDA-approved test that has been validated and its performance characteristics determined by the reporting laboratory.  This laboratory is certified under the Clinical Laboratory Improvement Amendments CLIA as qualified to perform high complexity clinical laboratory testing.    CIPROFLOXACIN  Value in next row Resistant      8 SENSITIVEThis is a modified FDA-approved test that has been validated and its performance characteristics determined by the reporting laboratory.  This laboratory is certified under the Clinical Laboratory Improvement Amendments CLIA as qualified to perform high complexity clinical laboratory testing.    GENTAMICIN  Value in next row Sensitive      8 SENSITIVEThis is a modified FDA-approved test that has been validated and its performance characteristics determined by the reporting laboratory.  This laboratory is certified under the Clinical Laboratory Improvement Amendments CLIA as qualified  to perform high complexity clinical laboratory testing.    NITROFURANTOIN Value in next row Sensitive      8 SENSITIVEThis is a modified FDA-approved test that has been validated and its performance characteristics determined by the reporting laboratory.  This laboratory is certified under the Clinical Laboratory Improvement Amendments CLIA as qualified to perform high complexity clinical laboratory testing.    TRIMETH/SULFA Value in next row Resistant      8 SENSITIVEThis is a modified FDA-approved test that has been validated and its performance characteristics determined by the reporting laboratory.  This laboratory is certified under the Clinical Laboratory Improvement Amendments CLIA as qualified to perform high complexity clinical laboratory testing.    AMPICILLIN /SULBACTAM Value in next row Resistant      8 SENSITIVEThis is a modified FDA-approved test that has been validated and its performance characteristics determined by the reporting laboratory.  This laboratory is certified under the Clinical Laboratory Improvement Amendments CLIA as qualified to perform high complexity clinical laboratory testing.    PIP/TAZO Value in next row Sensitive      <=4 SENSITIVEThis is a modified FDA-approved test that has been validated and its performance characteristics determined by the reporting laboratory.  This laboratory is certified under the Clinical Laboratory Improvement Amendments CLIA as qualified to perform high complexity clinical laboratory testing.    MEROPENEM Value in next row Sensitive      <=4 SENSITIVEThis is a modified FDA-approved test that has been validated and its performance characteristics determined by the reporting laboratory.  This laboratory is certified under the Clinical Laboratory Improvement Amendments CLIA as qualified to perform high complexity clinical laboratory testing.    * >=100,000 COLONIES/mL ESCHERICHIA COLI  Culture, blood (Routine X 2) w Reflex to ID Panel      Status: None (Preliminary result)   Collection Time: 10/18/24 10:33 AM   Specimen: BLOOD  Result Value Ref Range Status   Specimen Description BLOOD BLOOD RIGHT HAND  Final   Special Requests   Final    BOTTLES DRAWN AEROBIC AND ANAEROBIC Blood Culture adequate volume   Culture   Final    NO GROWTH < 24 HOURS Performed at [facility], [address] [place], [place] [address]    Report Status PENDING  Incomplete  Culture, blood (Routine X 2) w Reflex to ID Panel     Status: None (Preliminary result)   Collection Time: 10/18/24 10:33 AM   Specimen: BLOOD  Result Value Ref Range Status   Specimen Description BLOOD BLOOD LEFT HAND  Final   Special Requests   Final    BOTTLES DRAWN AEROBIC AND ANAEROBIC Blood Culture adequate volume   Culture   Final    NO GROWTH < 24 HOURS Performed at [facility], [address] [place], [place] [address]    Report Status PENDING  Incomplete    Procedures and diagnostic studies:  No results found.             LOS: 4 days   Cydney Alvarenga  [facility]   Pager on [email or web address]. If 7PM-7AM, please contact night-coverage at [email or web address]     10/19/2024, 2:16 PM

## 2024-10-19 NOTE — Evaluation (Signed)
 Occupational Therapy Evaluation Patient Details Name: [name] MRN: [id number] DOB: 10-18-1935 Today's Date: 10/19/2024   History of Present Illness   [name] is a 89 y.o. male with medical history significant of PAF on Eliquis , sCHF with EF 35-40, HTN, HLD, DM, GERD, CKD-4, CAD, s/p of stent, CABG, cardiac arrest, complete heart block, s/p of pacemaker, GIB, who presents with shortness of breath. Admitted for management of acute on chronic systolic CHF.   Clinical Impressions [name] was seen for OT evaluation this date. Prior to hospital admission, pt was MOD I for household distances. Pt lives with spouse and daughter. Pt currently requires CGA + RW for toilet t/f and seated pericare. MAAX A for LB access in sitting, R groin pain limiting. SpO2 95% on RA with activity, increased WOB with bed mobility resolves with seated rest break. Pt would benefit from skilled OT to address noted impairments and functional limitations (see below for any additional details). Upon hospital discharge, recommend OT follow up.     If plan is discharge home, recommend the following:   A little help with bathing/dressing/bathroom;Help with stairs or ramp for entrance     Functional Status Assessment   Patient has had a recent decline in their functional status and demonstrates the ability to make significant improvements in function in a reasonable and predictable amount of time.     Equipment Recommendations   BSC/3in1     Recommendations for Other Services         Precautions/Restrictions   Precautions Precautions: Fall Recall of Precautions/Restrictions: Intact Restrictions Weight Bearing Restrictions Per Provider Order: No     Mobility Bed Mobility Overal bed mobility: Needs Assistance Bed Mobility: Supine to Sit     Supine to sit: Min assist          Transfers Overall transfer level: Needs assistance Equipment used: Rolling walker (2 wheels) Transfers: Sit  to/from Stand Sit to Stand: Contact guard assist                  Balance Overall balance assessment: Needs assistance Sitting-balance support: No upper extremity supported, Feet supported Sitting balance-[name]: Fair     Standing balance support: Bilateral upper extremity supported Standing balance-[name]: Fair                             ADL either performed or assessed with clinical judgement   ADL Overall ADL's : Needs assistance/impaired                                       General ADL Comments: CGA + RW for toilet t/f and seated pericare. MAAX A for LB access in sitting, R groin pain limiting      Pertinent Vitals/Pain Pain Assessment Pain Assessment: Faces Faces Pain Scale: Hurts little more Pain Location: R groin pain Pain Descriptors / Indicators: Discomfort, Grimacing Pain Intervention(s): Limited activity within patient's tolerance, Repositioned     Extremity/Trunk Assessment Upper Extremity Assessment Upper Extremity Assessment: Overall WFL for tasks assessed   Lower Extremity Assessment Lower Extremity Assessment: Generalized weakness       Communication Communication Communication: Impaired Factors Affecting Communication: Reduced clarity of speech   Cognition Arousal: Alert Behavior During Therapy: WFL for tasks assessed/performed Cognition: No apparent impairments  Following commands: Impaired Following commands impaired: Follows one step commands with increased time     Cueing  General Comments      SpO2 95% on RA   Exercises     Shoulder Instructions      Home Living Family/patient expects to be discharged to:: Private residence Living Arrangements: Spouse/significant other;Children Available Help at Discharge: Family;Available 24 hours/day Type of Home: House Home Access: Ramped entrance     Home Layout: One level     Bathroom  Shower/Tub: Chief Strategy Officer: Handicapped height     Home Equipment: Cane - single Librarian, Academic (2 wheels);Shower seat;Grab bars - toilet;Grab bars - tub/shower          Prior Functioning/Environment Prior Level of Function : Independent/Modified Independent             Mobility Comments: ambulatory with RW/SPC ADLs Comments: PRN assistance for bathing    OT Problem List: Decreased strength;Decreased range of motion;Decreased activity tolerance;Impaired balance (sitting and/or standing);Decreased safety awareness   OT Treatment/Interventions: Self-care/ADL training;Therapeutic exercise;Energy conservation;DME and/or AE instruction;Therapeutic activities;Balance training;Patient/family education      OT Goals(Current goals can be found in the care plan section)   Acute Rehab OT Goals Patient Stated Goal: to go home OT Goal Formulation: With patient Time For Goal Achievement: 11/02/24 Potential to Achieve Goals: Good ADL Goals Pt Will Perform Grooming: with modified independence;standing Pt Will Perform Lower Body Dressing: with modified independence;sit to/from stand Pt Will Transfer to Toilet: with modified independence;ambulating;regular height toilet   OT Frequency:  Min 2X/week    Co-evaluation PT/OT/SLP Co-Evaluation/Treatment: Yes Reason for Co-Treatment: For patient/therapist safety;To address functional/ADL transfers PT goals addressed during session: Mobility/safety with mobility OT goals addressed during session: ADL's and self-care      AM-PAC OT 6 Clicks Daily Activity     Outcome Measure Help from another person eating meals?: None Help from another person taking care of personal grooming?: A Little Help from another person toileting, which includes using toliet, bedpan, or urinal?: A Little Help from another person bathing (including washing, rinsing, drying)?: A Lot Help from another person to put on and taking off  regular upper body clothing?: A Little Help from another person to put on and taking off regular lower body clothing?: A Lot 6 Click Score: 17   End of Session Equipment Utilized During Treatment: Rolling walker (2 wheels)  Activity Tolerance: Patient tolerated treatment well Patient left: in chair;with call bell/phone within reach;with chair alarm set  OT Visit Diagnosis: Other abnormalities of gait and mobility (R26.89);Muscle weakness (generalized) (M62.81)                Time: 9043-8985 OT Time Calculation (min): 18 min Charges:  OT General Charges $OT Visit: 1 Visit OT Evaluation $OT Eval Moderate Complexity: 1 Mod  Elston Slot, M.S. OTR/L  10/19/2024, 10:45 AM  ascom [phone]

## 2024-10-19 NOTE — Plan of Care (Signed)

## 2024-10-19 NOTE — Evaluation (Signed)
 Physical Therapy Evaluation Patient Details Name: Tristram Milian MRN: [id number] DOB: November 15, 1934 Today's Date: 10/19/2024  History of Present Illness  Mohamud Mrozek is a 89 y.o. male with medical history significant of PAF on Eliquis , sCHF with EF 35-40, HTN, HLD, DM, GERD, CKD-4, CAD, s/p of stent, CABG, cardiac arrest, complete heart block, s/p of pacemaker, GIB, who presents with shortness of breath. Admitted for management of acute on chronic systolic CHF.  Clinical Impression  Patient admitted with the above. PTA, patient lives with wife and daughter and was ambulatory with RW at baseline for household distances. Required minA for bed mobility and noted increased WOB which improved with seated rest break. Able to stand and ambulate with RW and CGA. SpO2 >92% on RA throughout session. Patient rates 7/10 RPE after mobility. Patient will benefit from skilled PT services during acute stay to address listed deficits. Patient will benefit from ongoing therapy at discharge to maximize functional independence and safety.         If plan is discharge home, recommend the following: A little help with walking and/or transfers;A little help with bathing/dressing/bathroom;Assistance with cooking/housework;Assist for transportation;Help with stairs or ramp for entrance   Can travel by private vehicle        Equipment Recommendations BSC/3in1  Recommendations for Other Services       Functional Status Assessment Patient has had a recent decline in their functional status and demonstrates the ability to make significant improvements in function in a reasonable and predictable amount of time.     Precautions / Restrictions Precautions Precautions: Fall Recall of Precautions/Restrictions: Intact Restrictions Weight Bearing Restrictions Per Provider Order: No      Mobility  Bed Mobility Overal bed mobility: Needs Assistance Bed Mobility: Supine to Sit     Supine to sit: Min assist           Transfers Overall transfer level: Needs assistance Equipment used: Rolling Asante Ritacco (2 wheels) Transfers: Sit to/from Stand Sit to Stand: Contact guard assist                Ambulation/Gait Ambulation/Gait assistance: Contact guard assist Gait Distance (Feet): 20 Feet Assistive device: Rolling Jadin Creque (2 wheels) Gait Pattern/deviations: Step-to pattern, Decreased stride length Gait velocity: decreased     General Gait Details: very slow gait. SpO2 92% on RA with ambulation  Stairs            Wheelchair Mobility     Tilt Bed    Modified Rankin (Stroke Patients Only)       Balance Overall balance assessment: Needs assistance Sitting-balance support: No upper extremity supported, Feet supported Sitting balance-[name]: Fair     Standing balance support: Bilateral upper extremity supported Standing balance-[name]: Fair                               Pertinent Vitals/Pain Pain Assessment Pain Assessment: Faces Faces Pain Scale: Hurts little more Pain Location: R groin pain Pain Descriptors / Indicators: Discomfort, Grimacing Pain Intervention(s): Limited activity within patient's tolerance, Monitored during session    Home Living Family/patient expects to be discharged to:: Private residence Living Arrangements: Spouse/significant other;Children Available Help at Discharge: Family;Available 24 hours/day Type of Home: House Home Access: Ramped entrance       Home Layout: One level Home Equipment: Cane - single Librarian, Academic (2 wheels);Shower seat;Grab bars - toilet;Grab bars - tub/shower      Prior Function Prior Level of  Function : Independent/Modified Independent             Mobility Comments: ambulatory with RW/SPC ADLs Comments: PRN assistance for bathing     Extremity/Trunk Assessment   Upper Extremity Assessment Upper Extremity Assessment: Defer to OT evaluation    Lower Extremity Assessment Lower  Extremity Assessment: Generalized weakness       Communication   Communication Communication: Impaired Factors Affecting Communication: Reduced clarity of speech    Cognition Arousal: Alert Behavior During Therapy: WFL for tasks assessed/performed   PT - Cognitive impairments: No apparent impairments                         Following commands: Impaired Following commands impaired: Follows one step commands with increased time     Cueing       General Comments General comments (skin integrity, edema, etc.): SpO2 95% on RA    Exercises     Assessment/Plan    PT Assessment Patient needs continued PT services  PT Problem List Decreased strength;Decreased activity tolerance;Decreased mobility;Decreased balance;Cardiopulmonary status limiting activity;Decreased safety awareness;Decreased knowledge of precautions       PT Treatment Interventions Gait training;DME instruction;Functional mobility training;Therapeutic activities;Therapeutic exercise;Neuromuscular re-education;Balance training;Patient/family education    PT Goals (Current goals can be found in the Care Plan section)  Acute Rehab PT Goals Patient Stated Goal: to go home PT Goal Formulation: With patient Time For Goal Achievement: 11/02/24 Potential to Achieve Goals: Good    Frequency Min 2X/week     Co-evaluation PT/OT/SLP Co-Evaluation/Treatment: Yes Reason for Co-Treatment: For patient/therapist safety;To address functional/ADL transfers PT goals addressed during session: Mobility/safety with mobility OT goals addressed during session: ADL's and self-care       AM-PAC PT 6 Clicks Mobility  Outcome Measure Help needed turning from your back to your side while in a flat bed without using bedrails?: A Little Help needed moving from lying on your back to sitting on the side of a flat bed without using bedrails?: A Little Help needed moving to and from a bed to a chair (including a  wheelchair)?: A Little Help needed standing up from a chair using your arms (e.g., wheelchair or bedside chair)?: A Little Help needed to walk in hospital room?: A Little Help needed climbing 3-5 steps with a railing? : A Lot 6 Click Score: 17    End of Session   Activity Tolerance: Patient tolerated treatment well Patient left: in chair;with call bell/phone within reach;with chair alarm set Nurse Communication: Mobility status PT Visit Diagnosis: Unsteadiness on feet (R26.81);Muscle weakness (generalized) (M62.81)    Time: 9045-8985 PT Time Calculation (min) (ACUTE ONLY): 20 min   Charges:   PT Evaluation $PT Eval Moderate Complexity: 1 Mod   PT General Charges $$ ACUTE PT VISIT: 1 Visit         Maryanne Finder, PT, DPT Physical Therapist - [facility]  [facility]   Taffany Heiser A Alastair Hennes 10/19/2024, 11:02 AM

## 2024-10-20 DIAGNOSIS — I5023 Acute on chronic systolic (congestive) heart failure: Secondary | ICD-10-CM | POA: Diagnosis not present

## 2024-10-20 LAB — CBC
HCT: 40.8 % (ref 39.0–52.0)
Hemoglobin: 13.1 g/dL (ref 13.0–17.0)
MCH: 28 pg (ref 26.0–34.0)
MCHC: 32.1 g/dL (ref 30.0–36.0)
MCV: 87.2 fL (ref 80.0–100.0)
Platelets: 124 K/uL — ABNORMAL LOW (ref 150–400)
RBC: 4.68 MIL/uL (ref 4.22–5.81)
RDW: 15.9 % — ABNORMAL HIGH (ref 11.5–15.5)
WBC: 6.1 K/uL (ref 4.0–10.5)
nRBC: 0 % (ref 0.0–0.2)

## 2024-10-20 LAB — GLUCOSE, CAPILLARY
Glucose-Capillary: 144 mg/dL — ABNORMAL HIGH (ref 70–99)
Glucose-Capillary: 152 mg/dL — ABNORMAL HIGH (ref 70–99)
Glucose-Capillary: 161 mg/dL — ABNORMAL HIGH (ref 70–99)
Glucose-Capillary: 192 mg/dL — ABNORMAL HIGH (ref 70–99)

## 2024-10-20 LAB — BASIC METABOLIC PANEL WITH GFR
Anion gap: 10 (ref 5–15)
BUN: 68 mg/dL — ABNORMAL HIGH (ref 8–23)
CO2: 27 mmol/L (ref 22–32)
Calcium: 7.3 mg/dL — ABNORMAL LOW (ref 8.9–10.3)
Chloride: 102 mmol/L (ref 98–111)
Creatinine, Ser: 2.7 mg/dL — ABNORMAL HIGH (ref 0.61–1.24)
GFR, Estimated: 22 mL/min — ABNORMAL LOW (ref >60)
Glucose, Bld: 145 mg/dL — ABNORMAL HIGH (ref 70–99)
Potassium: 4 mmol/L (ref 3.5–5.1)
Sodium: 138 mmol/L (ref 135–145)

## 2024-10-20 LAB — CULTURE, BLOOD (ROUTINE X 2): Culture  Setup Time: NO GROWTH

## 2024-10-20 NOTE — Plan of Care (Signed)

## 2024-10-20 NOTE — Progress Notes (Signed)
 Mobility Specialist - Progress Note   Pre-mobility, SpO2-92% On O2 @ 2L  During mobility: SpO2-95% RA  Post-mobility:  SPO2- 96% RA   10/20/24 1235  Mobility  Activity Ambulated with assistance;Stood at bedside;Dangled on edge of bed;Respositioned in chair  Level of Assistance Contact guard assist, steadying assist  Assistive Device Front wheel walker  Distance Ambulated (ft) 20 ft  Range of Motion/Exercises Active;All extremities  Activity Response Tolerated well  Mobility visit 1 Mobility  Mobility Specialist Start Time (ACUTE ONLY) 1100  Mobility Specialist Stop Time (ACUTE ONLY) 1120  Mobility Specialist Time Calculation (min) (ACUTE ONLY) 20 min   Pt was supine in bed with the HOB elevated on O2 @ 2L. Pt agreed to mobility. Pt O2 was checked throughout activity as a precaution. Pt is able today to get to the EOB with minA and bed features. Pt is able today to STS with 2 WW and minA. Pt ambulated well today within the room. Pt O2 vitals WNL. After activity pt is in the recliner with needs in reach and chair alarm on.  Clem Rodes Mobility Specialist 10/20/2024, 12:43 PM

## 2024-10-20 NOTE — Progress Notes (Signed)
 PHARMACY - PHYSICIAN COMMUNICATION CRITICAL VALUE ALERT - BLOOD CULTURE IDENTIFICATION (BCID)  [name] is an 89 y.o. male who presented to [facility] on 10/15/2024 with a chief complaint of SOB/UTI/Bacteremia   Assessment:  12/8 blood culture with GNR on gram stain. Lab unable to identify organism. Would have to place an order for send out lab for further identification. Received order number from lab tech but unable to find it. Contacted Dr. Jens regarding the update and informed him about calling micro lab so that he can place verbal order for send out lab.   Name of physician (or Provider) Contacted: Dr. Jens  Current antibiotics: Cefepime  and Flagyl   Changes to prescribed antibiotics recommended:  Continue current antibiotics  Results for orders placed or performed during the hospital encounter of 10/15/24  Blood Culture ID Panel (Reflexed) (Collected: 10/15/2024  7:41 PM)  Result Value Ref Range   Enterococcus faecalis NOT DETECTED NOT DETECTED   Enterococcus Faecium NOT DETECTED NOT DETECTED   Listeria monocytogenes NOT DETECTED NOT DETECTED   Staphylococcus species DETECTED (A) NOT DETECTED   Staphylococcus aureus (BCID) NOT DETECTED NOT DETECTED   Staphylococcus epidermidis NOT DETECTED NOT DETECTED   Staphylococcus lugdunensis NOT DETECTED NOT DETECTED   Streptococcus species NOT DETECTED NOT DETECTED   Streptococcus agalactiae NOT DETECTED NOT DETECTED   Streptococcus pneumoniae NOT DETECTED NOT DETECTED   Streptococcus pyogenes NOT DETECTED NOT DETECTED   A.calcoaceticus-baumannii NOT DETECTED NOT DETECTED   Bacteroides fragilis NOT DETECTED NOT DETECTED   Enterobacterales NOT DETECTED NOT DETECTED   Enterobacter cloacae complex NOT DETECTED NOT DETECTED   Escherichia coli NOT DETECTED NOT DETECTED   Klebsiella aerogenes NOT DETECTED NOT DETECTED   Klebsiella oxytoca NOT DETECTED NOT DETECTED   Klebsiella pneumoniae NOT DETECTED NOT DETECTED   Proteus species NOT  DETECTED NOT DETECTED   Salmonella species NOT DETECTED NOT DETECTED   Serratia marcescens NOT DETECTED NOT DETECTED   Haemophilus influenzae NOT DETECTED NOT DETECTED   Neisseria meningitidis NOT DETECTED NOT DETECTED   Pseudomonas aeruginosa NOT DETECTED NOT DETECTED   Stenotrophomonas maltophilia NOT DETECTED NOT DETECTED   Candida albicans NOT DETECTED NOT DETECTED   Candida auris NOT DETECTED NOT DETECTED   Candida glabrata NOT DETECTED NOT DETECTED   Candida krusei NOT DETECTED NOT DETECTED   Candida parapsilosis NOT DETECTED NOT DETECTED   Candida tropicalis NOT DETECTED NOT DETECTED   Cryptococcus neoformans/gattii NOT DETECTED NOT DETECTED    Ransom Blanch PGY-1 Pharmacy Resident  [place] [facility]  10/20/2024 1:49 PM

## 2024-10-20 NOTE — Progress Notes (Addendum)
 Progress Note    Jahziah Simonin  FMW:[id number] DOB: [birth year]  DOA: 10/15/2024 PCP: [name], MD      Brief Narrative:    Medical records reviewed and are as summarized below:  Hymen Arnett is a 89 y.o. male with medical history significant of PAF on Eliquis , sCHF with EF 35-40, HTN, HLD, DM, GERD, CKD-4, CAD, s/p of stent, CABG, cardiac arrest, complete heart block, s/p of pacemaker, GIB, who presents with shortness of breath.  He was found to have pro-BNP > 35,000, rop 124 --> 114, lactic acid 3.5, WBC 5.6, renal functions worse than baseline, negative PCR for COVID, flu and RSV, UA (cloudy appearance, large amount of leukocyte, moderate amount of leukocyte, WBC> 50).  Temperature 97.3, blood pressure 128/104, heart rate 72, RR 14.  CT of head negative for acute intracranial abnormalities.     Patient is admitted to PCU as inpatient for management of acute on chronic systolic CHF.         Assessment/Plan:   Principal Problem:   Acute on chronic systolic CHF (congestive heart failure) (HCC) Active Problems:   Acute respiratory failure with hypoxia (HCC)   UTI (urinary tract infection)   Proctitis   CAD, s/p CABG x 5   Myocardial injury   HTN (hypertension)   HLD (hyperlipidemia)   Type II diabetes mellitus with renal manifestations (HCC)   Paroxysmal atrial fibrillation (HCC)   Elevated lactic acid level   Thrombocytopenia   CKD (chronic kidney disease), stage IV (HCC)   Hypothermia   Obesity (BMI 30-39.9)     Body mass index is 35.63 kg/m (pended).   Acute on chronic hypoxic respiratory failure: Continue 2 L/min oxygen via .  He said he usually uses oxygen at night.    Acute on chronic HFrEF: Continue torsemide .   S/p treatment with IV Lasix .   Continue Aldactone , Entresto  and carvedilol .   2D echo in October 2025 showed EF estimated at 25 to 30%.   Lactic acidosis: Probably from severe sepsis Acute E. coli UTI: Urine culture showed E.  coli.  Continue IV cefepime  Gram-negative rod bacteremia: 1 out of 4 bottles positive for gram-negative rods.  Follow-up blood culture and sensitivity report. I was informed by Ms. Tobie, pharmacist, to call the lab to request for species identification.  I called [facility] and requested same. Plan to treat with antibiotics for 7 days given gram-negative rod bacteremia.  Staph capitis bacteremia: 1 out of 4 bottles positive for Staph capitis likely a contaminant.  IV vancomycin  was discontinued on 10/19/2024 No growth on repeat blood cultures from 10/18/2024.   Proctitis: On IV cefepime  and Flagyl .   Hypothermia: Resolved   Type II DM, hypoglycemic episodes: Lantus  was discontinued on 10/16/2024.  Use NovoLog  as needed for hyperglycemia.   Paroxysmal atrial fibrillation: Continue Eliquis  and carvedilol    General weakness: PT and OT evaluation   Comorbidities include CKD stage IV, hypertension, CAD s/p CABG, dyslipidemia, thrombocytopenia   Discussed diagnoses, prognosis and goals of care with the patient.  I informed him about increased risk of cardiovascular mortality given underlying CAD, CHF, CKD and advanced age.  He said he preferred to be DNR but his family wants him to remain full code.  I encouraged him to continue to have discussions with his family about his wishes.  He would like to remain full code for now. Consulted palliative care team.    Diet Order  DIET DYS 2 Fluid consistency: Thin  Diet effective now                                  Consultants: None  Procedures: None    Medications:    apixaban   2.5 mg Oral BID   carvedilol   12.5 mg Oral BID WC   insulin  aspart  0-5 Units Subcutaneous QHS   insulin  aspart  0-9 Units Subcutaneous TID WC   metroNIDAZOLE   500 mg Oral Q12H   pantoprazole   40 mg Oral BID   rosuvastatin   40 mg Oral Daily   sacubitril -valsartan   1 tablet Oral BID   spironolactone   25 mg  Oral Daily   tamsulosin   0.4 mg Oral Daily   torsemide   40 mg Oral Daily   Continuous Infusions:  ceFEPime  (MAXIPIME ) IV 2 g (10/20/24 0929)     Anti-infectives (From admission, onward)    Start     Dose/Rate Route Frequency Ordered Stop   10/20/24 0500  vancomycin  (VANCOREADY) IVPB 1500 mg/300 mL  Status:  Discontinued       Placed in Followed by Linked Group   1,500 mg 150 mL/hr over 120 Minutes Intravenous Every 48 hours 10/18/24 0353 10/19/24 0739   10/20/24 0500  vancomycin  (VANCOREADY) IVPB 1250 mg/250 mL  Status:  Discontinued       Placed in Followed by Linked Group   1,250 mg 166.7 mL/hr over 90 Minutes Intravenous Every 48 hours 10/19/24 0739 10/19/24 1347   10/18/24 0500  vancomycin  (VANCOREADY) IVPB 2000 mg/400 mL       Placed in Followed by Linked Group   2,000 mg 200 mL/hr over 120 Minutes Intravenous  Once 10/18/24 0353 10/18/24 0708   10/17/24 1000  metroNIDAZOLE  (FLAGYL ) tablet 500 mg        500 mg Oral Every 12 hours 10/17/24 0747     10/17/24 1000  ceFEPIme  (MAXIPIME ) 2 g in sodium chloride  0.9 % 100 mL IVPB        2 g 200 mL/hr over 30 Minutes Intravenous Every 24 hours 10/17/24 0829     10/16/24 2000  cefTRIAXone  (ROCEPHIN ) 2 g in sodium chloride  0.9 % 100 mL IVPB  Status:  Discontinued        2 g 200 mL/hr over 30 Minutes Intravenous Every 24 hours 10/16/24 0004 10/17/24 0829   10/16/24 0015  metroNIDAZOLE  (FLAGYL ) IVPB 500 mg  Status:  Discontinued        500 mg 100 mL/hr over 60 Minutes Intravenous Every 12 hours 10/16/24 0004 10/17/24 0747   10/15/24 2015  cefTRIAXone  (ROCEPHIN ) 2 g in sodium chloride  0.9 % 100 mL IVPB        2 g 200 mL/hr over 30 Minutes Intravenous Once 10/15/24 2011 10/15/24 2123   10/15/24 2015  azithromycin  (ZITHROMAX ) 500 mg in sodium chloride  0.9 % 250 mL IVPB        500 mg 250 mL/hr over 60 Minutes Intravenous  Once 10/15/24 2011 10/15/24 2202              Family Communication/Anticipated D/C date and  plan/Code Status   DVT prophylaxis: apixaban  (ELIQUIS ) tablet 2.5 mg Start: 10/16/24 0115 apixaban  (ELIQUIS ) tablet 2.5 mg     Code Status: Full Code  Family Communication: None Disposition Plan: Plan to discharge home   Status is: Inpatient Remains inpatient appropriate because: CHF, sepsis from UTI, possible bacteremia  Subjective:   Interval events noted.  He complains of pain in the right groin.  [place],  RN, at the bedside  Objective:    Vitals:   10/20/24 0440 10/20/24 0500 10/20/24 0857 10/20/24 1231  BP: (!) 110/57  (!) 103/56 112/65  Pulse: 62  70 71  Resp: 20  18 18   Temp: 98.2 F (36.8 C)  98.5 F (36.9 C) 98.3 F (36.8 C)  TempSrc:      SpO2: 95%  95% 99%  Weight:  106.3 kg    Height:       No data found.   Intake/Output Summary (Last 24 hours) at 10/20/2024 1415 Last data filed at 10/20/2024 1356 Gross per 24 hour  Intake 120 ml  Output 600 ml  Net -480 ml   Filed Weights   10/18/24 0511 10/19/24 0500 10/20/24 0500  Weight: 102.5 kg 107.8 kg 106.3 kg    Exam:  GEN: NAD SKIN: Warm and dry EYES: No pallor or icterus ENT: MMM CV: RRR PULM: CTA B ABD: soft, distended, NT, +BS CNS: AAO x 3, non focal EXT: Lower extremity edema extending from the thighs/groin to bilateral feet      Data Reviewed:   I have personally reviewed following labs and imaging studies:  Labs: Labs show the following:   Basic Metabolic Panel: Recent Labs  Lab 10/15/24 1935 10/17/24 0425 10/18/24 1033 10/19/24 0431 10/20/24 0134  NA 144 143 139 140 138  K 3.6 3.5 4.1 4.3 4.0  CL 100 102 104 100 102  CO2 25 28 25 29 27   GLUCOSE 125* 111* 141* 155* 145*  BUN 72* 69* 66* 66* 68*  CREATININE 2.87* 2.72* 2.67* 2.61* 2.70*  CALCIUM  8.5* 7.3* 7.2* 7.7* 7.3*  PHOS  --   --   --  3.8  --    GFR Estimated Creatinine Clearance: 22.8 mL/min (A) (by C-G formula based on SCr of 2.7 mg/dL (H)). Liver Function Tests: Recent Labs  Lab 10/15/24 1935  10/19/24 0431  AST 29  --   ALT 8  --   ALKPHOS 85  --   BILITOT 1.0  --   PROT 8.3*  --   ALBUMIN  4.4 3.2*   No results for input(s): LIPASE, AMYLASE in the last 168 hours. No results for input(s): AMMONIA in the last 168 hours. Coagulation profile No results for input(s): INR, PROTIME in the last 168 hours.  CBC: Recent Labs  Lab 10/15/24 1935 10/17/24 0425 10/18/24 1033 10/20/24 0134  WBC 5.6 4.8 4.6 6.1  NEUTROABS 4.0 3.0  --   --   HGB 15.9 13.6 12.5* 13.1  HCT 50.4 42.9 39.5 40.8  MCV 89.0 89.0 88.0 87.2  PLT 116* 113* 115* 124*   Cardiac Enzymes: No results for input(s): CKTOTAL, CKMB, CKMBINDEX, TROPONINI in the last 168 hours. BNP (last 3 results) Recent Labs    10/03/24 1202 10/10/24 1058 10/15/24 1935  PROBNP 20,348* 24,507.0* >35,000.0*   CBG: Recent Labs  Lab 10/19/24 1215 10/19/24 1642 10/19/24 2117 10/20/24 0856 10/20/24 1229  GLUCAP 169* 165* 88 144* 152*   D-Dimer: No results for input(s): DDIMER in the last 72 hours. Hgb A1c: No results for input(s): HGBA1C in the last 72 hours. Lipid Profile: No results for input(s): CHOL, HDL, LDLCALC, TRIG, CHOLHDL, LDLDIRECT in the last 72 hours. Thyroid function studies: No results for input(s): TSH, T4TOTAL, T3FREE, THYROIDAB in the last 72 hours.  Invalid input(s): FREET3 Anemia work up: No results for input(s): VITAMINB12,  FOLATE, FERRITIN, TIBC, IRON, RETICCTPCT in the last 72 hours. Sepsis Labs: Recent Labs  Lab 10/15/24 1935 10/15/24 2129 10/15/24 2344 10/16/24 0329 10/16/24 0530 10/17/24 0425 10/18/24 1033 10/20/24 0134  WBC 5.6  --   --   --   --  4.8 4.6 6.1  LATICACIDVEN  --  3.5* 3.3* 2.8* 2.3*  --   --   --     Microbiology Recent Results (from the past 240 hours)  Resp panel by RT-PCR (RSV, Flu A&B, Covid) Anterior Nasal Swab     Status: None   Collection Time: 10/15/24  7:35 PM   Specimen: Anterior Nasal Swab   Result Value Ref Range Status   SARS Coronavirus 2 by RT PCR NEGATIVE NEGATIVE Final    Comment: (NOTE) SARS-CoV-2 target nucleic acids are NOT DETECTED.  The SARS-CoV-2 RNA is generally detectable in upper respiratory specimens during the acute phase of infection. The lowest concentration of SARS-CoV-2 viral copies this assay can detect is 138 copies/mL. A negative result does not preclude SARS-Cov-2 infection and should not be used as the sole basis for treatment or other patient management decisions. A negative result may occur with  improper specimen collection/handling, submission of specimen other than nasopharyngeal swab, presence of viral mutation(s) within the areas targeted by this assay, and inadequate number of viral copies(<138 copies/mL). A negative result must be combined with clinical observations, patient history, and epidemiological information. The expected result is Negative.  Fact Sheet for Patients:  bloggercourse.com  Fact Sheet for Healthcare Providers:  seriousbroker.it  This test is no t yet approved or cleared by the [place]  FDA and  has been authorized for detection and/or diagnosis of SARS-CoV-2 by FDA under an Emergency Use Authorization (EUA). This EUA will remain  in effect (meaning this test can be used) for the duration of the COVID-19 declaration under Section 564(b)(1) of the Act, 21 U.S.C.section 360bbb-3(b)(1), unless the authorization is terminated  or revoked sooner.       Influenza A by PCR NEGATIVE NEGATIVE Final   Influenza B by PCR NEGATIVE NEGATIVE Final    Comment: (NOTE) The Xpert Xpress SARS-CoV-2/FLU/RSV plus assay is intended as an aid in the diagnosis of influenza from Nasopharyngeal swab specimens and should not be used as a sole basis for treatment. Nasal washings and aspirates are unacceptable for Xpert Xpress SARS-CoV-2/FLU/RSV testing.  Fact Sheet for  Patients: bloggercourse.com  Fact Sheet for Healthcare Providers: seriousbroker.it  This test is not yet approved or cleared by the [place]  FDA and has been authorized for detection and/or diagnosis of SARS-CoV-2 by FDA under an Emergency Use Authorization (EUA). This EUA will remain in effect (meaning this test can be used) for the duration of the COVID-19 declaration under Section 564(b)(1) of the Act, 21 U.S.C. section 360bbb-3(b)(1), unless the authorization is terminated or revoked.     Resp Syncytial Virus by PCR NEGATIVE NEGATIVE Final    Comment: (NOTE) Fact Sheet for Patients: bloggercourse.com  Fact Sheet for Healthcare Providers: seriousbroker.it  This test is not yet approved or cleared by the [place]  FDA and has been authorized for detection and/or diagnosis of SARS-CoV-2 by FDA under an Emergency Use Authorization (EUA). This EUA will remain in effect (meaning this test can be used) for the duration of the COVID-19 declaration under Section 564(b)(1) of the Act, 21 U.S.C. section 360bbb-3(b)(1), unless the authorization is terminated or revoked.  Performed at [facility], [address] [place], [place] [address]  Blood Culture (routine x 2)     Status: Abnormal   Collection Time: 10/15/24  7:41 PM   Specimen: BLOOD  Result Value Ref Range Status   Specimen Description   Final    BLOOD BLOOD LEFT FOREARM Performed at [facility], [address] [place], [place] [address]    Special Requests   Final    BOTTLES DRAWN AEROBIC AND ANAEROBIC Blood Culture results may not be optimal due to an inadequate volume of blood received in culture bottles Performed at [facility], [address] [place], [place] [address]    Culture  Setup Time   Final    GRAM POSITIVE COCCI ANAEROBIC BOTTLE ONLY CRITICAL RESULT CALLED TO,  READ BACK BY AND VERIFIED WITH:  CATHALEEN PATEL AT 9664 10/18/24 JG    Culture (A)  Final    STAPHYLOCOCCUS CAPITIS THE SIGNIFICANCE OF ISOLATING THIS ORGANISM FROM A SINGLE SET OF BLOOD CULTURES WHEN MULTIPLE SETS ARE DRAWN IS UNCERTAIN. PLEASE NOTIFY THE MICROBIOLOGY DEPARTMENT WITHIN ONE WEEK IF SPECIATION AND SENSITIVITIES ARE REQUIRED. Performed at [facility], [address], [place], [place] [address]    Report Status 10/20/2024 FINAL  Final  Blood Culture (routine x 2)     Status: None (Preliminary result)   Collection Time: 10/15/24  7:41 PM   Specimen: BLOOD  Result Value Ref Range Status   Specimen Description   Final    BLOOD BLOOD RIGHT FOREARM Performed at [facility], [address], [place], [place] [address]    Special Requests   Final    BOTTLES DRAWN AEROBIC AND ANAEROBIC Blood Culture results may not be optimal due to an inadequate volume of blood received in culture bottles Performed at [facility], [address] [place], [place] [address]    Culture  Setup Time   Final    GRAM NEGATIVE RODS AEROBIC BOTTLE ONLY Organism ID to follow CRITICAL RESULT CALLED TO, READ BACK BY AND VERIFIED WITH: WILL LENON 10/17/24 0814 MW Performed at [facility], [address], [place], [place] [address]    Culture   Final    CULTURE REINCUBATED FOR BETTER GROWTH Performed at [facility], [address], [place], [place] [address]    Report Status PENDING  Incomplete  Blood Culture ID Panel (Reflexed)     Status: None   Collection Time: 10/15/24  7:41 PM  Result Value Ref Range Status   Enterococcus faecalis NOT DETECTED NOT DETECTED Final   Enterococcus Faecium NOT DETECTED NOT DETECTED Final   Listeria monocytogenes NOT DETECTED NOT DETECTED Final   Staphylococcus species NOT DETECTED NOT DETECTED Final   Staphylococcus aureus (BCID) NOT DETECTED NOT DETECTED Final   Staphylococcus epidermidis NOT DETECTED NOT DETECTED  Final   Staphylococcus lugdunensis NOT DETECTED NOT DETECTED Final   Streptococcus species NOT DETECTED NOT DETECTED Final   Streptococcus agalactiae NOT DETECTED NOT DETECTED Final   Streptococcus pneumoniae NOT DETECTED NOT DETECTED Final   Streptococcus pyogenes NOT DETECTED NOT DETECTED Final   A.calcoaceticus-baumannii NOT DETECTED NOT DETECTED Final   Bacteroides fragilis NOT DETECTED NOT DETECTED Final   Enterobacterales NOT DETECTED NOT DETECTED Final   Enterobacter cloacae complex NOT DETECTED NOT DETECTED Final   Escherichia coli NOT DETECTED NOT DETECTED Final   Klebsiella aerogenes NOT DETECTED NOT DETECTED Final   Klebsiella oxytoca NOT DETECTED NOT DETECTED Final   Klebsiella pneumoniae NOT DETECTED NOT DETECTED Final   Proteus species NOT DETECTED  NOT DETECTED Final   Salmonella species NOT DETECTED NOT DETECTED Final   Serratia marcescens NOT DETECTED NOT DETECTED Final   Haemophilus influenzae NOT DETECTED NOT DETECTED Final   Neisseria meningitidis NOT DETECTED NOT DETECTED Final   Pseudomonas aeruginosa NOT DETECTED NOT DETECTED Final   Stenotrophomonas maltophilia NOT DETECTED NOT DETECTED Final   Candida albicans NOT DETECTED NOT DETECTED Final   Candida auris NOT DETECTED NOT DETECTED Final   Candida glabrata NOT DETECTED NOT DETECTED Final   Candida krusei NOT DETECTED NOT DETECTED Final   Candida parapsilosis NOT DETECTED NOT DETECTED Final   Candida tropicalis NOT DETECTED NOT DETECTED Final   Cryptococcus neoformans/gattii NOT DETECTED NOT DETECTED Final    Comment: Performed at [facility], [address] [place], [place] [address]  Blood Culture ID Panel (Reflexed)     Status: Abnormal   Collection Time: 10/15/24  7:41 PM  Result Value Ref Range Status   Enterococcus faecalis NOT DETECTED NOT DETECTED Final   Enterococcus Faecium NOT DETECTED NOT DETECTED Final   Listeria monocytogenes NOT DETECTED NOT DETECTED Final   Staphylococcus species  DETECTED (A) NOT DETECTED Final    Comment: CRITICAL RESULT CALLED TO, READ BACK BY AND VERIFIED WITH:  CATHALEEN BLANCH AT 0335 10/18/24 JG    Staphylococcus aureus (BCID) NOT DETECTED NOT DETECTED Final   Staphylococcus epidermidis NOT DETECTED NOT DETECTED Final   Staphylococcus lugdunensis NOT DETECTED NOT DETECTED Final   Streptococcus species NOT DETECTED NOT DETECTED Final   Streptococcus agalactiae NOT DETECTED NOT DETECTED Final   Streptococcus pneumoniae NOT DETECTED NOT DETECTED Final   Streptococcus pyogenes NOT DETECTED NOT DETECTED Final   A.calcoaceticus-baumannii NOT DETECTED NOT DETECTED Final   Bacteroides fragilis NOT DETECTED NOT DETECTED Final   Enterobacterales NOT DETECTED NOT DETECTED Final   Enterobacter cloacae complex NOT DETECTED NOT DETECTED Final   Escherichia coli NOT DETECTED NOT DETECTED Final   Klebsiella aerogenes NOT DETECTED NOT DETECTED Final   Klebsiella oxytoca NOT DETECTED NOT DETECTED Final   Klebsiella pneumoniae NOT DETECTED NOT DETECTED Final   Proteus species NOT DETECTED NOT DETECTED Final   Salmonella species NOT DETECTED NOT DETECTED Final   Serratia marcescens NOT DETECTED NOT DETECTED Final   Haemophilus influenzae NOT DETECTED NOT DETECTED Final   Neisseria meningitidis NOT DETECTED NOT DETECTED Final   Pseudomonas aeruginosa NOT DETECTED NOT DETECTED Final   Stenotrophomonas maltophilia NOT DETECTED NOT DETECTED Final   Candida albicans NOT DETECTED NOT DETECTED Final   Candida auris NOT DETECTED NOT DETECTED Final   Candida glabrata NOT DETECTED NOT DETECTED Final   Candida krusei NOT DETECTED NOT DETECTED Final   Candida parapsilosis NOT DETECTED NOT DETECTED Final   Candida tropicalis NOT DETECTED NOT DETECTED Final   Cryptococcus neoformans/gattii NOT DETECTED NOT DETECTED Final    Comment: Performed at [facility], [address], [place], [place] [address]  Urine Culture     Status: Abnormal   Collection Time:  10/15/24  9:29 PM   Specimen: Urine, Random  Result Value Ref Range Status   Specimen Description   Final    URINE, RANDOM Performed at [facility], [address] [place], [place] [address]    Special Requests   Final    NONE Reflexed from [id number] Performed at [facility], [address] [place], [place] [address]    Culture >=100,000 COLONIES/mL ESCHERICHIA COLI (A)  Final   Report Status 10/18/2024 FINAL  Final   Organism  ID, Bacteria ESCHERICHIA COLI (A)  Final      Susceptibility   Escherichia coli - MIC*    AMPICILLIN  >=32 RESISTANT Resistant     CEFAZOLIN  (URINE) Value in next row Sensitive      8 SENSITIVEThis is a modified FDA-approved test that has been validated and its performance characteristics determined by the reporting laboratory.  This laboratory is certified under the Clinical Laboratory Improvement Amendments CLIA as qualified to perform high complexity clinical laboratory testing.    CEFEPIME  Value in next row Sensitive      8 SENSITIVEThis is a modified FDA-approved test that has been validated and its performance characteristics determined by the reporting laboratory.  This laboratory is certified under the Clinical Laboratory Improvement Amendments CLIA as qualified to perform high complexity clinical laboratory testing.    ERTAPENEM Value in next row Sensitive      8 SENSITIVEThis is a modified FDA-approved test that has been validated and its performance characteristics determined by the reporting laboratory.  This laboratory is certified under the Clinical Laboratory Improvement Amendments CLIA as qualified to perform high complexity clinical laboratory testing.    CEFTRIAXONE  Value in next row Sensitive      8 SENSITIVEThis is a modified FDA-approved test that has been validated and its performance characteristics determined by the reporting laboratory.  This laboratory is certified under the Clinical Laboratory Improvement Amendments CLIA as  qualified to perform high complexity clinical laboratory testing.    CIPROFLOXACIN  Value in next row Resistant      8 SENSITIVEThis is a modified FDA-approved test that has been validated and its performance characteristics determined by the reporting laboratory.  This laboratory is certified under the Clinical Laboratory Improvement Amendments CLIA as qualified to perform high complexity clinical laboratory testing.    GENTAMICIN  Value in next row Sensitive      8 SENSITIVEThis is a modified FDA-approved test that has been validated and its performance characteristics determined by the reporting laboratory.  This laboratory is certified under the Clinical Laboratory Improvement Amendments CLIA as qualified to perform high complexity clinical laboratory testing.    NITROFURANTOIN Value in next row Sensitive      8 SENSITIVEThis is a modified FDA-approved test that has been validated and its performance characteristics determined by the reporting laboratory.  This laboratory is certified under the Clinical Laboratory Improvement Amendments CLIA as qualified to perform high complexity clinical laboratory testing.    TRIMETH/SULFA Value in next row Resistant      8 SENSITIVEThis is a modified FDA-approved test that has been validated and its performance characteristics determined by the reporting laboratory.  This laboratory is certified under the Clinical Laboratory Improvement Amendments CLIA as qualified to perform high complexity clinical laboratory testing.    AMPICILLIN /SULBACTAM Value in next row Resistant      8 SENSITIVEThis is a modified FDA-approved test that has been validated and its performance characteristics determined by the reporting laboratory.  This laboratory is certified under the Clinical Laboratory Improvement Amendments CLIA as qualified to perform high complexity clinical laboratory testing.    PIP/TAZO Value in next row Sensitive      <=4 SENSITIVEThis is a modified FDA-approved  test that has been validated and its performance characteristics determined by the reporting laboratory.  This laboratory is certified under the Clinical Laboratory Improvement Amendments CLIA as qualified to perform high complexity clinical laboratory testing.    MEROPENEM Value in next row Sensitive      <=4 SENSITIVEThis is a modified FDA-approved  test that has been validated and its performance characteristics determined by the reporting laboratory.  This laboratory is certified under the Clinical Laboratory Improvement Amendments CLIA as qualified to perform high complexity clinical laboratory testing.    * >=100,000 COLONIES/mL ESCHERICHIA COLI  Culture, blood (Routine X 2) w Reflex to ID Panel     Status: None (Preliminary result)   Collection Time: 10/18/24 10:33 AM   Specimen: BLOOD  Result Value Ref Range Status   Specimen Description BLOOD BLOOD RIGHT HAND  Final   Special Requests   Final    BOTTLES DRAWN AEROBIC AND ANAEROBIC Blood Culture adequate volume   Culture   Final    NO GROWTH 2 DAYS Performed at [facility], [address], [place], [place] [address]    Report Status PENDING  Incomplete  Culture, blood (Routine X 2) w Reflex to ID Panel     Status: None (Preliminary result)   Collection Time: 10/18/24 10:33 AM   Specimen: BLOOD  Result Value Ref Range Status   Specimen Description BLOOD BLOOD LEFT HAND  Final   Special Requests   Final    BOTTLES DRAWN AEROBIC AND ANAEROBIC Blood Culture adequate volume   Culture   Final    NO GROWTH 2 DAYS Performed at [facility], [address], [place], [place] [address]    Report Status PENDING  Incomplete    Procedures and diagnostic studies:  No results found.             LOS: 5 days   Chayson Charters  [facility]   Pager on [email or web address]. If 7PM-7AM, please contact night-coverage at [email or web address]     10/20/2024, 2:15 PM

## 2024-10-21 ENCOUNTER — Inpatient Hospital Stay

## 2024-10-21 LAB — GLUCOSE, CAPILLARY
Glucose-Capillary: 134 mg/dL — ABNORMAL HIGH (ref 70–99)
Glucose-Capillary: 162 mg/dL — ABNORMAL HIGH (ref 70–99)
Glucose-Capillary: 202 mg/dL — ABNORMAL HIGH (ref 70–99)
Glucose-Capillary: 131 mg/dL — ABNORMAL HIGH (ref 70–99)

## 2024-10-21 MED ORDER — PREDNISONE 20 MG PO TABS
20.0000 mg | ORAL_TABLET | Freq: Every day | ORAL | Status: DC
  Administered 2024-10-21 – 2024-10-23 (×3): 20 mg via ORAL
  Filled 2024-10-21 (×3): qty 1

## 2024-10-21 NOTE — Plan of Care (Signed)

## 2024-10-21 NOTE — Progress Notes (Signed)
 PROGRESS NOTE    [name]  FMW:[id number] DOB: [birth year] DOA: 10/15/2024 PCP: [name], MD  Chief Complaint  Patient presents with   Respiratory Distress    Hospital Course:  [name] is an 89 year old male with paroxysmal A-fib on Eliquis , systolic heart failure with EF 35 to 40%, hypertension, hyperlipidemia, diabetes, GERD, CKD stage IV, CAD status post stenting and CABG, complete heart block status post pacemaker who presents on this admission with shortness of breath.  proBNP over 35,000, troponin 124 down to 114 on repeat.  Patient was admitted for acute on chronic systolic CHF exacerbation  Subjective: This morning patient is complaining of left ankle pain.  He denies any trauma to this area.  He reports the whole ankle hurts.   Objective: Vitals:   10/21/24 0500 10/21/24 0930 10/21/24 1252 10/21/24 1624  BP:  125/61 (!) 113/54 (!) 99/52  Pulse:  69 70 70  Resp:  18 18 18   Temp:  98.4 F (36.9 C) 99.5 F (37.5 C) 98.1 F (36.7 C)  TempSrc:      SpO2:  98% 98% 96%  Weight: 106.3 kg     Height:        Intake/Output Summary (Last 24 hours) at 10/21/2024 1627 Last data filed at 10/21/2024 1427 Gross per 24 hour  Intake 360 ml  Output 1950 ml  Net -1590 ml   Filed Weights   10/19/24 0500 10/20/24 0500 10/21/24 0500  Weight: 107.8 kg 106.3 kg 106.3 kg    Examination: General exam: Appears calm and comfortable, NAD  Respiratory system: No work of breathing, symmetric chest wall expansion Cardiovascular system: S1 & S2 heard, RRR.  Gastrointestinal system: Abdomen is nondistended, soft and nontender.  Neuro: Alert and oriented.  Extremities: Symmetric, expected ROM, 1+ pitting edema bilaterally, tender to palpation in the medial aspect of the left malleolus  Assessment & Plan:  Principal Problem:   Acute on chronic systolic CHF (congestive heart failure) (HCC) Active Problems:   Acute respiratory failure with hypoxia (HCC)   UTI (urinary  tract infection)   Proctitis   CAD, s/p CABG x 5   Myocardial injury   HTN (hypertension)   HLD (hyperlipidemia)   Type II diabetes mellitus with renal manifestations (HCC)   Paroxysmal atrial fibrillation (HCC)   Elevated lactic acid level   Thrombocytopenia   CKD (chronic kidney disease), stage IV (HCC)   Hypothermia   Obesity (BMI 30-39.9)   Acute on chronic heart failure with reduced EF - Echo October 2025: EF 25 to 30% - Status post IV Lasix , -3 L so far. - May need additional diuresis - Continue with torsemide , Aldactone , Entresto , carvedilol   Left ankle pain - Patient denies any recent trauma.  Very tender to palpation.  No obvious swelling or erythema outside of his known lower extremity edema. - Films negative for fracture or dislocation - Suspect patient is having a gout flare.  Will start short course of steroids and monitor for improvement  E. coli UTI -- Urine culture with E. coli with some resistance.  Continue with IV cefepime   Gram-negative bacteremia - 1 out of 4 bottles positive for gram-negative rods.  Reportedly lab did not initially send species identification.  Prior attending called lab directly on 12/13 and requested species ID - Continue with treatment for 7 days for now, await results  Staph capitis bacteremia - 1 out of 4 bottles positive for Staph capitis.  Likely contaminant - IV vancomycin  was discontinued - No growth  on repeat blood cultures 12/11  Proctitis - On IV cefepime  and Flagyl   Hypothermia - Resolved  Type 2 diabetes with hyperglycemia - Continue with sliding scale as needed.  Long-acting discontinue 12/9 - May have some hyperglycemia with added steroids today  Paroxysmal A-fib - Continue Eliquis  and carvedilol   Generalized weakness - PT/OT  CKD stage IV - Kidney function appears to be near baseline  Hypertension - Resume home meds.  Titrate as needed  CAD status post CABG - Resume home meds  Dyslipidemia -  Continue statin  Thrombocytopenia  Advanced age Poor prognosis - Patient has extensive cardiac history including severe heart failure with underlying CAD, CKD, and advanced age.  He plans to discuss his ongoing goals of care directly with his family.  He would like to remain full code for now.  Palliative care has been consulted  Body mass index is 35.63 kg/m (pended). Obesity Class II - Outpatient follow up for lifestyle modification and risk factor management   DVT prophylaxis: Eliquis    Code Status: Full Code Disposition:  Inpt pending clinical resolution  Consultants:    Procedures:    Antimicrobials:  Anti-infectives (From admission, onward)    Start     Dose/Rate Route Frequency Ordered Stop   10/20/24 0500  vancomycin  (VANCOREADY) IVPB 1500 mg/300 mL  Status:  Discontinued       Placed in Followed by Linked Group   1,500 mg 150 mL/hr over 120 Minutes Intravenous Every 48 hours 10/18/24 0353 10/19/24 0739   10/20/24 0500  vancomycin  (VANCOREADY) IVPB 1250 mg/250 mL  Status:  Discontinued       Placed in Followed by Linked Group   1,250 mg 166.7 mL/hr over 90 Minutes Intravenous Every 48 hours 10/19/24 0739 10/19/24 1347   10/18/24 0500  vancomycin  (VANCOREADY) IVPB 2000 mg/400 mL       Placed in Followed by Linked Group   2,000 mg 200 mL/hr over 120 Minutes Intravenous  Once 10/18/24 0353 10/18/24 0708   10/17/24 1000  metroNIDAZOLE  (FLAGYL ) tablet 500 mg        500 mg Oral Every 12 hours 10/17/24 0747     10/17/24 1000  ceFEPIme  (MAXIPIME ) 2 g in sodium chloride  0.9 % 100 mL IVPB        2 g 200 mL/hr over 30 Minutes Intravenous Every 24 hours 10/17/24 0829     10/16/24 2000  cefTRIAXone  (ROCEPHIN ) 2 g in sodium chloride  0.9 % 100 mL IVPB  Status:  Discontinued        2 g 200 mL/hr over 30 Minutes Intravenous Every 24 hours 10/16/24 0004 10/17/24 0829   10/16/24 0015  metroNIDAZOLE  (FLAGYL ) IVPB 500 mg  Status:  Discontinued        500 mg 100 mL/hr over  60 Minutes Intravenous Every 12 hours 10/16/24 0004 10/17/24 0747   10/15/24 2015  cefTRIAXone  (ROCEPHIN ) 2 g in sodium chloride  0.9 % 100 mL IVPB        2 g 200 mL/hr over 30 Minutes Intravenous Once 10/15/24 2011 10/15/24 2123   10/15/24 2015  azithromycin  (ZITHROMAX ) 500 mg in sodium chloride  0.9 % 250 mL IVPB        500 mg 250 mL/hr over 60 Minutes Intravenous  Once 10/15/24 2011 10/15/24 2202       Data Reviewed: I have personally reviewed following labs and imaging studies CBC: Recent Labs  Lab 10/15/24 1935 10/17/24 0425 10/18/24 1033 10/20/24 0134  WBC 5.6 4.8 4.6 6.1  NEUTROABS  4.0 3.0  --   --   HGB 15.9 13.6 12.5* 13.1  HCT 50.4 42.9 39.5 40.8  MCV 89.0 89.0 88.0 87.2  PLT 116* 113* 115* 124*   Basic Metabolic Panel: Recent Labs  Lab 10/15/24 1935 10/17/24 0425 10/18/24 1033 10/19/24 0431 10/20/24 0134  NA 144 143 139 140 138  K 3.6 3.5 4.1 4.3 4.0  CL 100 102 104 100 102  CO2 25 28 25 29 27   GLUCOSE 125* 111* 141* 155* 145*  BUN 72* 69* 66* 66* 68*  CREATININE 2.87* 2.72* 2.67* 2.61* 2.70*  CALCIUM  8.5* 7.3* 7.2* 7.7* 7.3*  PHOS  --   --   --  3.8  --    GFR: Estimated Creatinine Clearance: 22.8 mL/min (A) (by C-G formula based on SCr of 2.7 mg/dL (H)). Liver Function Tests: Recent Labs  Lab 10/15/24 1935 10/19/24 0431  AST 29  --   ALT 8  --   ALKPHOS 85  --   BILITOT 1.0  --   PROT 8.3*  --   ALBUMIN  4.4 3.2*   CBG: Recent Labs  Lab 10/20/24 1615 10/20/24 2217 10/21/24 0845 10/21/24 1257 10/21/24 1622  GLUCAP 161* 192* 134* 162* 202*    Recent Results (from the past 240 hours)  Resp panel by RT-PCR (RSV, Flu A&B, Covid) Anterior Nasal Swab     Status: None   Collection Time: 10/15/24  7:35 PM   Specimen: Anterior Nasal Swab  Result Value Ref Range Status   SARS Coronavirus 2 by RT PCR NEGATIVE NEGATIVE Final    Comment: (NOTE) SARS-CoV-2 target nucleic acids are NOT DETECTED.  The SARS-CoV-2 RNA is generally detectable in  upper respiratory specimens during the acute phase of infection. The lowest concentration of SARS-CoV-2 viral copies this assay can detect is 138 copies/mL. A negative result does not preclude SARS-Cov-2 infection and should not be used as the sole basis for treatment or other patient management decisions. A negative result may occur with  improper specimen collection/handling, submission of specimen other than nasopharyngeal swab, presence of viral mutation(s) within the areas targeted by this assay, and inadequate number of viral copies(<138 copies/mL). A negative result must be combined with clinical observations, patient history, and epidemiological information. The expected result is Negative.  Fact Sheet for Patients:  bloggercourse.com  Fact Sheet for Healthcare Providers:  seriousbroker.it  This test is no t yet approved or cleared by the [place]  FDA and  has been authorized for detection and/or diagnosis of SARS-CoV-2 by FDA under an Emergency Use Authorization (EUA). This EUA will remain  in effect (meaning this test can be used) for the duration of the COVID-19 declaration under Section 564(b)(1) of the Act, 21 U.S.C.section 360bbb-3(b)(1), unless the authorization is terminated  or revoked sooner.       Influenza A by PCR NEGATIVE NEGATIVE Final   Influenza B by PCR NEGATIVE NEGATIVE Final    Comment: (NOTE) The Xpert Xpress SARS-CoV-2/FLU/RSV plus assay is intended as an aid in the diagnosis of influenza from Nasopharyngeal swab specimens and should not be used as a sole basis for treatment. Nasal washings and aspirates are unacceptable for Xpert Xpress SARS-CoV-2/FLU/RSV testing.  Fact Sheet for Patients: bloggercourse.com  Fact Sheet for Healthcare Providers: seriousbroker.it  This test is not yet approved or cleared by the [place]  FDA and has been  authorized for detection and/or diagnosis of SARS-CoV-2 by FDA under an Emergency Use Authorization (EUA). This EUA will remain in effect (meaning  this test can be used) for the duration of the COVID-19 declaration under Section 564(b)(1) of the Act, 21 U.S.C. section 360bbb-3(b)(1), unless the authorization is terminated or revoked.     Resp Syncytial Virus by PCR NEGATIVE NEGATIVE Final    Comment: (NOTE) Fact Sheet for Patients: bloggercourse.com  Fact Sheet for Healthcare Providers: seriousbroker.it  This test is not yet approved or cleared by the [place]  FDA and has been authorized for detection and/or diagnosis of SARS-CoV-2 by FDA under an Emergency Use Authorization (EUA). This EUA will remain in effect (meaning this test can be used) for the duration of the COVID-19 declaration under Section 564(b)(1) of the Act, 21 U.S.C. section 360bbb-3(b)(1), unless the authorization is terminated or revoked.  Performed at [facility], [address], [place], [place] [address]   Blood Culture (routine x 2)     Status: Abnormal   Collection Time: 10/15/24  7:41 PM   Specimen: BLOOD  Result Value Ref Range Status   Specimen Description   Final    BLOOD BLOOD LEFT FOREARM Performed at [facility], [address] [place], [place] [address]    Special Requests   Final    BOTTLES DRAWN AEROBIC AND ANAEROBIC Blood Culture results may not be optimal due to an inadequate volume of blood received in culture bottles Performed at [facility], [address] [place], [place] [address]    Culture  Setup Time   Final    GRAM POSITIVE COCCI ANAEROBIC BOTTLE ONLY CRITICAL RESULT CALLED TO, READ BACK BY AND VERIFIED WITH:  CATHALEEN PATEL AT 9664 10/18/24 JG    Culture (A)  Final    STAPHYLOCOCCUS CAPITIS THE SIGNIFICANCE OF ISOLATING THIS ORGANISM FROM A SINGLE SET OF BLOOD CULTURES WHEN MULTIPLE SETS  ARE DRAWN IS UNCERTAIN. PLEASE NOTIFY THE MICROBIOLOGY DEPARTMENT WITHIN ONE WEEK IF SPECIATION AND SENSITIVITIES ARE REQUIRED. Performed at [facility], [address], [place], [place] [address]    Report Status 10/20/2024 FINAL  Final  Blood Culture (routine x 2)     Status: None (Preliminary result)   Collection Time: 10/15/24  7:41 PM   Specimen: BLOOD  Result Value Ref Range Status   Specimen Description   Final    BLOOD BLOOD RIGHT FOREARM Performed at [facility], [address], [place], [place] [address]    Special Requests   Final    BOTTLES DRAWN AEROBIC AND ANAEROBIC Blood Culture results may not be optimal due to an inadequate volume of blood received in culture bottles Performed at [facility], [address] [place], [place] [address]    Culture  Setup Time   Final    GRAM NEGATIVE RODS AEROBIC BOTTLE ONLY Organism ID to follow CRITICAL RESULT CALLED TO, READ BACK BY AND VERIFIED WITH: WILL LENON 10/17/24 0814 MW Performed at [facility], [address] [place], [place] [address]    Culture   Final    VONNE NEGATIVE RODS CULTURE REINCUBATED FOR BETTER GROWTH Performed at [facility], [address], [place], [place] [address]    Report Status PENDING  Incomplete  Blood Culture ID Panel (Reflexed)     Status: None   Collection Time: 10/15/24  7:41 PM  Result Value Ref Range Status   Enterococcus faecalis NOT DETECTED NOT DETECTED Final   Enterococcus Faecium NOT DETECTED NOT DETECTED Final   Listeria monocytogenes NOT DETECTED NOT DETECTED Final   Staphylococcus species NOT DETECTED NOT  DETECTED Final   Staphylococcus aureus (BCID) NOT DETECTED NOT DETECTED Final   Staphylococcus epidermidis NOT DETECTED NOT DETECTED Final   Staphylococcus lugdunensis NOT DETECTED NOT DETECTED Final   Streptococcus species NOT DETECTED NOT DETECTED Final   Streptococcus agalactiae NOT DETECTED NOT DETECTED Final   Streptococcus  pneumoniae NOT DETECTED NOT DETECTED Final   Streptococcus pyogenes NOT DETECTED NOT DETECTED Final   A.calcoaceticus-baumannii NOT DETECTED NOT DETECTED Final   Bacteroides fragilis NOT DETECTED NOT DETECTED Final   Enterobacterales NOT DETECTED NOT DETECTED Final   Enterobacter cloacae complex NOT DETECTED NOT DETECTED Final   Escherichia coli NOT DETECTED NOT DETECTED Final   Klebsiella aerogenes NOT DETECTED NOT DETECTED Final   Klebsiella oxytoca NOT DETECTED NOT DETECTED Final   Klebsiella pneumoniae NOT DETECTED NOT DETECTED Final   Proteus species NOT DETECTED NOT DETECTED Final   Salmonella species NOT DETECTED NOT DETECTED Final   Serratia marcescens NOT DETECTED NOT DETECTED Final   Haemophilus influenzae NOT DETECTED NOT DETECTED Final   Neisseria meningitidis NOT DETECTED NOT DETECTED Final   Pseudomonas aeruginosa NOT DETECTED NOT DETECTED Final   Stenotrophomonas maltophilia NOT DETECTED NOT DETECTED Final   Candida albicans NOT DETECTED NOT DETECTED Final   Candida auris NOT DETECTED NOT DETECTED Final   Candida glabrata NOT DETECTED NOT DETECTED Final   Candida krusei NOT DETECTED NOT DETECTED Final   Candida parapsilosis NOT DETECTED NOT DETECTED Final   Candida tropicalis NOT DETECTED NOT DETECTED Final   Cryptococcus neoformans/gattii NOT DETECTED NOT DETECTED Final    Comment: Performed at [facility], [address] [place], [place] [address]  Blood Culture ID Panel (Reflexed)     Status: Abnormal   Collection Time: 10/15/24  7:41 PM  Result Value Ref Range Status   Enterococcus faecalis NOT DETECTED NOT DETECTED Final   Enterococcus Faecium NOT DETECTED NOT DETECTED Final   Listeria monocytogenes NOT DETECTED NOT DETECTED Final   Staphylococcus species DETECTED (A) NOT DETECTED Final    Comment: CRITICAL RESULT CALLED TO, READ BACK BY AND VERIFIED WITH:  CATHALEEN BLANCH AT 0335 10/18/24 JG    Staphylococcus aureus (BCID) NOT DETECTED NOT DETECTED  Final   Staphylococcus epidermidis NOT DETECTED NOT DETECTED Final   Staphylococcus lugdunensis NOT DETECTED NOT DETECTED Final   Streptococcus species NOT DETECTED NOT DETECTED Final   Streptococcus agalactiae NOT DETECTED NOT DETECTED Final   Streptococcus pneumoniae NOT DETECTED NOT DETECTED Final   Streptococcus pyogenes NOT DETECTED NOT DETECTED Final   A.calcoaceticus-baumannii NOT DETECTED NOT DETECTED Final   Bacteroides fragilis NOT DETECTED NOT DETECTED Final   Enterobacterales NOT DETECTED NOT DETECTED Final   Enterobacter cloacae complex NOT DETECTED NOT DETECTED Final   Escherichia coli NOT DETECTED NOT DETECTED Final   Klebsiella aerogenes NOT DETECTED NOT DETECTED Final   Klebsiella oxytoca NOT DETECTED NOT DETECTED Final   Klebsiella pneumoniae NOT DETECTED NOT DETECTED Final   Proteus species NOT DETECTED NOT DETECTED Final   Salmonella species NOT DETECTED NOT DETECTED Final   Serratia marcescens NOT DETECTED NOT DETECTED Final   Haemophilus influenzae NOT DETECTED NOT DETECTED Final   Neisseria meningitidis NOT DETECTED NOT DETECTED Final   Pseudomonas aeruginosa NOT DETECTED NOT DETECTED Final   Stenotrophomonas maltophilia NOT DETECTED NOT DETECTED Final   Candida albicans NOT DETECTED NOT DETECTED Final   Candida auris NOT DETECTED NOT DETECTED Final   Candida glabrata NOT DETECTED NOT DETECTED Final   Candida krusei NOT DETECTED NOT DETECTED Final  Candida parapsilosis NOT DETECTED NOT DETECTED Final   Candida tropicalis NOT DETECTED NOT DETECTED Final   Cryptococcus neoformans/gattii NOT DETECTED NOT DETECTED Final    Comment: Performed at [facility], [address] [place], [place] [address]  Urine Culture     Status: Abnormal   Collection Time: 10/15/24  9:29 PM   Specimen: Urine, Random  Result Value Ref Range Status   Specimen Description   Final    URINE, RANDOM Performed at [facility], [address] [place], [place]  [address]    Special Requests   Final    NONE Reflexed from [id number] Performed at [facility], [address] [place], [place] [address]    Culture >=100,000 COLONIES/mL ESCHERICHIA COLI (A)  Final   Report Status 10/18/2024 FINAL  Final   Organism ID, Bacteria ESCHERICHIA COLI (A)  Final      Susceptibility   Escherichia coli - MIC*    AMPICILLIN  >=32 RESISTANT Resistant     CEFAZOLIN  (URINE) Value in next row Sensitive      8 SENSITIVEThis is a modified FDA-approved test that has been validated and its performance characteristics determined by the reporting laboratory.  This laboratory is certified under the Clinical Laboratory Improvement Amendments CLIA as qualified to perform high complexity clinical laboratory testing.    CEFEPIME  Value in next row Sensitive      8 SENSITIVEThis is a modified FDA-approved test that has been validated and its performance characteristics determined by the reporting laboratory.  This laboratory is certified under the Clinical Laboratory Improvement Amendments CLIA as qualified to perform high complexity clinical laboratory testing.    ERTAPENEM Value in next row Sensitive      8 SENSITIVEThis is a modified FDA-approved test that has been validated and its performance characteristics determined by the reporting laboratory.  This laboratory is certified under the Clinical Laboratory Improvement Amendments CLIA as qualified to perform high complexity clinical laboratory testing.    CEFTRIAXONE  Value in next row Sensitive      8 SENSITIVEThis is a modified FDA-approved test that has been validated and its performance characteristics determined by the reporting laboratory.  This laboratory is certified under the Clinical Laboratory Improvement Amendments CLIA as qualified to perform high complexity clinical laboratory testing.    CIPROFLOXACIN  Value in next row Resistant      8 SENSITIVEThis is a modified FDA-approved test that has been validated and its  performance characteristics determined by the reporting laboratory.  This laboratory is certified under the Clinical Laboratory Improvement Amendments CLIA as qualified to perform high complexity clinical laboratory testing.    GENTAMICIN  Value in next row Sensitive      8 SENSITIVEThis is a modified FDA-approved test that has been validated and its performance characteristics determined by the reporting laboratory.  This laboratory is certified under the Clinical Laboratory Improvement Amendments CLIA as qualified to perform high complexity clinical laboratory testing.    NITROFURANTOIN Value in next row Sensitive      8 SENSITIVEThis is a modified FDA-approved test that has been validated and its performance characteristics determined by the reporting laboratory.  This laboratory is certified under the Clinical Laboratory Improvement Amendments CLIA as qualified to perform high complexity clinical laboratory testing.    TRIMETH/SULFA Value in next row Resistant      8 SENSITIVEThis is a modified FDA-approved test that has been validated and its performance characteristics determined by the reporting laboratory.  This laboratory is certified under the Clinical Laboratory  Improvement Amendments CLIA as qualified to perform high complexity clinical laboratory testing.    AMPICILLIN /SULBACTAM Value in next row Resistant      8 SENSITIVEThis is a modified FDA-approved test that has been validated and its performance characteristics determined by the reporting laboratory.  This laboratory is certified under the Clinical Laboratory Improvement Amendments CLIA as qualified to perform high complexity clinical laboratory testing.    PIP/TAZO Value in next row Sensitive      <=4 SENSITIVEThis is a modified FDA-approved test that has been validated and its performance characteristics determined by the reporting laboratory.  This laboratory is certified under the Clinical Laboratory Improvement Amendments CLIA as  qualified to perform high complexity clinical laboratory testing.    MEROPENEM Value in next row Sensitive      <=4 SENSITIVEThis is a modified FDA-approved test that has been validated and its performance characteristics determined by the reporting laboratory.  This laboratory is certified under the Clinical Laboratory Improvement Amendments CLIA as qualified to perform high complexity clinical laboratory testing.    * >=100,000 COLONIES/mL ESCHERICHIA COLI  Culture, blood (Routine X 2) w Reflex to ID Panel     Status: None (Preliminary result)   Collection Time: 10/18/24 10:33 AM   Specimen: BLOOD  Result Value Ref Range Status   Specimen Description BLOOD BLOOD RIGHT HAND  Final   Special Requests   Final    BOTTLES DRAWN AEROBIC AND ANAEROBIC Blood Culture adequate volume   Culture   Final    NO GROWTH 3 DAYS Performed at [facility], [address], [place], [place] [address]    Report Status PENDING  Incomplete  Culture, blood (Routine X 2) w Reflex to ID Panel     Status: None (Preliminary result)   Collection Time: 10/18/24 10:33 AM   Specimen: BLOOD  Result Value Ref Range Status   Specimen Description BLOOD BLOOD LEFT HAND  Final   Special Requests   Final    BOTTLES DRAWN AEROBIC AND ANAEROBIC Blood Culture adequate volume   Culture   Final    NO GROWTH 3 DAYS Performed at [facility], [address], [place], [place] [address]    Report Status PENDING  Incomplete     Radiology Studies: No results found.  Scheduled Meds:  apixaban   2.5 mg Oral BID   carvedilol   12.5 mg Oral BID WC   insulin  aspart  0-5 Units Subcutaneous QHS   insulin  aspart  0-9 Units Subcutaneous TID WC   metroNIDAZOLE   500 mg Oral Q12H   pantoprazole   40 mg Oral BID   rosuvastatin   40 mg Oral Daily   sacubitril -valsartan   1 tablet Oral BID   spironolactone   25 mg Oral Daily   tamsulosin   0.4 mg Oral Daily   torsemide   40 mg Oral Daily   Continuous Infusions:  ceFEPime   (MAXIPIME ) IV 2 g (10/21/24 1114)     LOS: 6 days  MDM: Patient is high risk for one or more organ failure.  They necessitate ongoing hospitalization for continued IV therapies and subsequent lab monitoring. Total time spent interpreting labs and vitals, reviewing the medical record, coordinating care amongst consultants and care team members, directly assessing and discussing care with the patient and/or family: 55 min  Wil Slape, DO [facility]  To contact the attending physician between 7A-7P please use Epic Chat. To contact the covering physician during after hours 7P-7A, please review Amion.  10/21/2024, 4:27 PM   *This document has  been created with the assistance of dictation software. Please excuse typographical errors. *

## 2024-10-22 DIAGNOSIS — Z515 Encounter for palliative care: Secondary | ICD-10-CM

## 2024-10-22 LAB — GLUCOSE, CAPILLARY
Glucose-Capillary: 135 mg/dL — ABNORMAL HIGH (ref 70–99)
Glucose-Capillary: 145 mg/dL — ABNORMAL HIGH (ref 70–99)
Glucose-Capillary: 188 mg/dL — ABNORMAL HIGH (ref 70–99)

## 2024-10-22 NOTE — Progress Notes (Signed)
 Occupational Therapy Treatment Patient Details Name: Zylen Wenig MRN: [id number] DOB: 1935/01/05 Today's Date: 10/22/2024   History of present illness Kdyn Vonbehren is a 89 y.o. male with medical history significant of PAF on Eliquis , sCHF with EF 35-40, HTN, HLD, DM, GERD, CKD-4, CAD, s/p of stent, CABG, cardiac arrest, complete heart block, s/p of pacemaker, GIB, who presents with shortness of breath. Admitted for management of acute on chronic systolic CHF.   OT comments  Mr Cerveny was seen for OT treatment on this date. Upon arrival to room pt in bed, agreeable to tx. Pt requires CGA + RW sit<>stand from elevated bed height, MIN A sit<>stand from low chair with no arm rests. CGA + RW for ADL t/f ~10 ft. MAX A don B socks in sitting - reports L ankle pain limiting mobility. Pt progress limited by pain, per chart suspect gout, will continue to follow POC. Discharge recommendation remains appropriate.       If plan is discharge home, recommend the following:  A little help with bathing/dressing/bathroom;Help with stairs or ramp for entrance   Equipment Recommendations  BSC/3in1    Recommendations for Other Services      Precautions / Restrictions Precautions Precautions: Fall Recall of Precautions/Restrictions: Intact Restrictions Weight Bearing Restrictions Per Provider Order: No       Mobility Bed Mobility Overal bed mobility: Needs Assistance Bed Mobility: Supine to Sit, Sit to Supine     Supine to sit: Supervision Sit to supine: Mod assist        Transfers Overall transfer level: Needs assistance Equipment used: Rolling walker (2 wheels) Transfers: Sit to/from Stand Sit to Stand: Min assist                 Balance Overall balance assessment: Needs assistance Sitting-balance support: No upper extremity supported, Feet supported Sitting balance-[name]: Good     Standing balance support: Bilateral upper extremity supported Standing balance-Leahy  Scale: Fair                             ADL either performed or assessed with clinical judgement   ADL Overall ADL's : Needs assistance/impaired                                       General ADL Comments: MIN A stand from chair with no arm rests, CGA stand from elevated bed height. CGA + RW for ADL t/f ~10 ft     Communication Communication Communication: Impaired Factors Affecting Communication: Reduced clarity of speech   Cognition Arousal: Alert Behavior During Therapy: WFL for tasks assessed/performed Cognition: No apparent impairments                               Following commands: Intact                      Pertinent Vitals/ Pain       Pain Assessment Pain Assessment: Faces Faces Pain Scale: Hurts whole lot Pain Location: L ankle, R knee Pain Descriptors / Indicators: Discomfort, Grimacing Pain Intervention(s): Limited activity within patient's tolerance, Repositioned (declines medication)   Frequency  Min 2X/week        Progress Toward Goals  OT Goals(current goals can now be found in the care plan section)  Progress  towards OT goals: Progressing toward goals  Acute Rehab OT Goals OT Goal Formulation: With patient Time For Goal Achievement: 11/02/24 Potential to Achieve Goals: Good ADL Goals Pt Will Perform Grooming: with modified independence;standing Pt Will Perform Lower Body Dressing: with modified independence;sit to/from stand Pt Will Transfer to Toilet: with modified independence;ambulating;regular height toilet  Plan      Co-evaluation                 AM-PAC OT 6 Clicks Daily Activity     Outcome Measure   Help from another person eating meals?: None Help from another person taking care of personal grooming?: A Little Help from another person toileting, which includes using toliet, bedpan, or urinal?: A Little Help from another person bathing (including washing, rinsing,  drying)?: A Lot Help from another person to put on and taking off regular upper body clothing?: A Little Help from another person to put on and taking off regular lower body clothing?: A Lot 6 Click Score: 17    End of Session Equipment Utilized During Treatment: Rolling walker (2 wheels)  OT Visit Diagnosis: Other abnormalities of gait and mobility (R26.89);Muscle weakness (generalized) (M62.81)   Activity Tolerance Patient tolerated treatment well   Patient Left in bed;with call bell/phone within reach;with bed alarm set   Nurse Communication          Time: 9079-9062 OT Time Calculation (min): 17 min  Charges: OT General Charges $OT Visit: 1 Visit OT Treatments $Self Care/Home Management : 8-22 mins  Elston Slot, M.S. OTR/L  10/22/2024, 10:05 AM  ascom [phone]

## 2024-10-22 NOTE — NC FL2 (Signed)
 [place]  MEDICAID FL2 LEVEL OF CARE FORM     IDENTIFICATION  Patient Name: [name] Birthdate: 1934-12-23 Sex: male Admission Date (Current Location): 10/15/2024  [place] and [place] Number:  Chiropodist and Address:  [facility], [address], [place], [place] [address]      Provider Number: [id number]  Attending Physician Name and Address:  Leesa Kast, DO  Relative Name and Phone Number:       Current Level of Care: Hospital Recommended Level of Care: Skilled Nursing Facility Prior Approval Number:    Date Approved/Denied:   PASRR Number: [id number]  Discharge Plan: SNF    Current Diagnoses: Patient Active Problem List   Diagnosis Date Noted   UTI (urinary tract infection) 10/16/2024   Proctitis 10/16/2024   Myocardial injury 10/16/2024   Elevated lactic acid level 10/16/2024   Hypothermia 10/16/2024   Upper GI bleed 09/16/2024   Hematochezia 09/16/2024   CKD (chronic kidney disease), stage IV (HCC) 09/16/2024   CHF (congestive heart failure) (HCC) 07/06/2024   Acute on chronic HFrEF (heart failure with reduced ejection fraction) (HCC) 03/24/2024   Scrotal edema 03/24/2024   Acute renal failure superimposed on stage 3b chronic kidney disease (HCC) 03/24/2024   Shortness of breath 12/24/2023   COVID-19 12/23/2023   CHF exacerbation (HCC) 12/23/2023   ACS (acute coronary syndrome) (HCC) 06/03/2023   Chronic systolic CHF (congestive heart failure) (HCC) 06/03/2023   Cardiac resynchronization therapy pacemaker (CRT-P) in place 05/10/2023   Acute on chronic congestive heart failure (HCC) 03/09/2023   Paroxysmal A-fib (HCC) 03/09/2023   Hypomagnesemia 03/09/2023   Hypercoagulable state due to paroxysmal atrial fibrillation (HCC) 02/08/2023   Paroxysmal atrial fibrillation (HCC) 02/08/2023   Constipation 12/19/2022   Chest pain 12/19/2022   Hypertensive heart disease with heart failure (HCC) 12/18/2022    Thrombocytopenia 12/17/2022   CKD stage 3b, GFR 30-44 ml/min (HCC) 12/17/2022   Obesity (BMI 30-39.9) 12/17/2022   Hypokalemia 12/17/2022   Ischemic cardiomyopathy 12/17/2022   Hypertensive urgency 03/29/2022   Dyslipidemia 03/29/2022   GERD without esophagitis 03/29/2022   Type 2 diabetes mellitus with hyperlipidemia (HCC) 03/29/2022   Elevated troponin 03/29/2022   Acute on chronic systolic CHF (congestive heart failure) (HCC) 01/04/2022   Dilated cardiomyopathy (HCC) 10/30/2021   Acute decompensated heart failure (HCC) 10/27/2021   Complete heart block s/p pacemaker (HCC) 10/12/2021   Junctional bradycardia    Acute on chronic diastolic CHF (congestive heart failure) (HCC) 08/26/2021   CAD (coronary artery disease)    HLD (hyperlipidemia)    GERD (gastroesophageal reflux disease)    Type II diabetes mellitus with renal manifestations (HCC)    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   CAD, s/p CABG x 5 08/25/2020   Non-ST elevation (NSTEMI) myocardial infarction (HCC) 08/21/2020   History of cardiac arrest 08/21/2020   HFrEF (heart failure with reduced ejection fraction) (HCC)    History of sustained ventricular fibrillation 08/18/2020   Acute respiratory failure with hypoxia (HCC)    Chronic diastolic heart failure (HCC)    Urinary tract infection 07/26/2019   HTN (hypertension) 07/26/2019   Diabetes (HCC) 07/26/2019   Unstable angina (HCC) 12/03/2008   Status post percutaneous transluminal coronary angioplasty 10/08/2008   Malignant neoplasm of prostate (HCC) 10/05/2005    Orientation RESPIRATION BLADDER Height & Weight     Self, Time, Situation, Place  Normal Incontinent Weight: 105.6 kg Height:  (P) 5' 8 (172.7 cm)  BEHAVIORAL SYMPTOMS/MOOD NEUROLOGICAL BOWEL NUTRITION STATUS   (  none)  (none) Continent Diet (dys 2)  AMBULATORY STATUS COMMUNICATION OF NEEDS Skin   Limited Assist Verbally Normal                       Personal Care Assistance Level of Assistance  Bathing,  Feeding, Dressing Bathing Assistance: Limited assistance Feeding assistance: Limited assistance Dressing Assistance: Limited assistance     Functional Limitations Info  Sight, Hearing, Speech Sight Info: Adequate Hearing Info: Adequate Speech Info: Adequate    SPECIAL CARE FACTORS FREQUENCY  PT (By licensed PT), OT (By licensed OT)     PT Frequency: 5 x week OT Frequency: 5 x week            Contractures Contractures Info: Not present    Additional Factors Info  Code Status, Allergies Code Status Info: Full Allergies Info: Angiotensin Receptor blockers, metformin           Current Medications (10/22/2024):  This is the current hospital active medication list Current Facility-Administered Medications  Medication Dose Route Frequency Provider Last Rate Last Admin   acetaminophen  (TYLENOL ) tablet 650 mg  650 mg Oral Q6H PRN Patel, Kishan S, RPH       albuterol  (PROVENTIL ) (2.5 MG/3ML) 0.083% nebulizer solution 2.5 mg  2.5 mg Nebulization Q4H PRN Niu, Xilin, MD   2.5 mg at 10/18/24 1135   apixaban  (ELIQUIS ) tablet 2.5 mg  2.5 mg Oral BID Niu, Xilin, MD   2.5 mg at 10/22/24 9046   carvedilol  (COREG ) tablet 12.5 mg  12.5 mg Oral BID WC Niu, Xilin, MD   12.5 mg at 10/22/24 9046   ceFEPIme  (MAXIPIME ) 2 g in sodium chloride  0.9 % 100 mL IVPB  2 g Intravenous Q24H Rashid, Farhan, MD 200 mL/hr at 10/22/24 1004 2 g at 10/22/24 1004   dextromethorphan -guaiFENesin  (MUCINEX  DM) 30-600 MG per 12 hr tablet 1 tablet  1 tablet Oral BID PRN Niu, Xilin, MD   1 tablet at 10/22/24 9040   hydrALAZINE  (APRESOLINE ) injection 5 mg  5 mg Intravenous Q2H PRN Niu, Xilin, MD       insulin  aspart (novoLOG ) injection 0-5 Units  0-5 Units Subcutaneous QHS Niu, Xilin, MD       insulin  aspart (novoLOG ) injection 0-9 Units  0-9 Units Subcutaneous TID WC Niu, Xilin, MD   1 Units at 10/22/24 1343   metroNIDAZOLE  (FLAGYL ) tablet 500 mg  500 mg Oral Q12H Chappell, Alex B, RPH   500 mg at 10/22/24 9045    morphine  (PF) 2 MG/ML injection 2 mg  2 mg Intravenous Q4H PRN Mansy, Jan A, MD   2 mg at 10/21/24 2151   ondansetron  (ZOFRAN ) injection 4 mg  4 mg Intravenous Q8H PRN Niu, Xilin, MD       oxyCODONE  (Oxy IR/ROXICODONE ) immediate release tablet 5 mg  5 mg Oral Q4H PRN Mansy, Jan A, MD       pantoprazole  (PROTONIX ) EC tablet 40 mg  40 mg Oral BID Niu, Xilin, MD   40 mg at 10/22/24 9045   predniSONE  (DELTASONE ) tablet 20 mg  20 mg Oral Q breakfast Dezii, Alexandra, DO   20 mg at 10/22/24 9046   rosuvastatin  (CRESTOR ) tablet 40 mg  40 mg Oral Daily Niu, Xilin, MD   40 mg at 10/22/24 9046   sacubitril -valsartan  (ENTRESTO ) 49-51 mg per tablet  1 tablet Oral BID Niu, Xilin, MD   1 tablet at 10/22/24 0954   spironolactone  (ALDACTONE ) tablet 25 mg  25 mg Oral  Daily Niu, Xilin, MD   25 mg at 10/22/24 9045   tamsulosin  (FLOMAX ) capsule 0.4 mg  0.4 mg Oral Daily Niu, Xilin, MD   0.4 mg at 10/22/24 9045   torsemide  (DEMADEX ) tablet 40 mg  40 mg Oral Daily Ayiku, Bernard, MD   40 mg at 10/22/24 9045     Discharge Medications: Please see discharge summary for a list of discharge medications.  Relevant Imaging Results:  Relevant Lab Results:   Additional Information SS# [id number]  [name], RN

## 2024-10-22 NOTE — Consult Note (Signed)
 Consultation Note Date: 10/22/2024 at 1400  Patient Name: [name]  DOB: 1935/06/21  MRN: [id number]  Age / Sex: 89 y.o., male  PCP: [name], MD Referring Physician: Leesa Kast, DO  HPI/Patient Profile: 89 y.o. male  with past medical history of paroxysmal A-fib on Eliquis , systolic heart failure with EF 35 to 40%, hypertension, hyperlipidemia, diabetes, GERD, CKD stage IV, CAD status post stenting and CABG, complete heart block status post pacemaker admitted on 10/15/2024 with shortness of breath.  Upon arrival to the ED, proBNP was over 35,000, troponin 124 (down to 114 on repeat).  Patient is being treated for acute on chronic systolic CHF exacerbation.  PT/OT are recommending SNF at DC.  Patient initially refused but has later agreed.  As per chart review, patient shares with attending on 12/13 that he was prefer to be a DNR but his family wants him to remain a full code.  Therefore, PMT was consulted support patient and family with goals of care discussions..   Clinical Assessment and Goals of Care: Extensive chart review completed prior to meeting patient including labs, vital signs, imaging, progress notes, orders, and available advanced directive documents from current and previous encounters. I then met with patient and his wife at bedside to discuss diagnosis prognosis, GOC, EOL wishes, disposition and options.  I introduced Palliative Medicine as specialized medical care for people living with serious illness. It focuses on providing relief from the symptoms and stress of a serious illness. The goal is to improve quality of life for both the patient and the family.  I attempted to discuss a brief life review.  However, patient and his wife share that they are interested in the television show that his arm.  Additionally, they would like frank discussions to be held with their  daughter who is on her way to the hospital.  I attempted to continue discussion with patient and family but they deferred to a later time when daughter is present.  PMT contact info given to patient.  Encouraged patient to call when prepared to continue goals of care discussion.  After the assessing the patient, I spoke with RN and advised that she let me know when patient's daughter is at bedside.  Awaiting arrival of patient's daughter to continue discussions.  No change to plan of care at this time.  Primary Decision Maker PATIENT  Physical Exam Vitals reviewed.  Constitutional:      General: He is not in acute distress.    Appearance: He is obese.  HENT:     Head: Normocephalic.     Mouth/Throat:     Mouth: Mucous membranes are moist.  Eyes:     Pupils: Pupils are equal, round, and reactive to light.  Pulmonary:     Effort: Pulmonary effort is normal.  Abdominal:     Palpations: Abdomen is soft.  Musculoskeletal:        General: Normal range of motion.     Comments: MAETC  Skin:    General: Skin is  warm and dry.  Neurological:     Mental Status: He is alert and oriented to person, place, and time.  Psychiatric:        Mood and Affect: Mood normal.        Behavior: Behavior normal.     Palliative Assessment/Data: 60%     Thank you for this consult. Palliative medicine will continue to follow and assist holistically.   40 minute visit includes: Detailed review of medical records (labs, imaging, vital signs), medically appropriate exam (mental status, respiratory, cardiac, skin), discussed with treatment team, counseling and educating patient, family and staff, documenting clinical information, medication management and coordination of care.  Signed by: Lamarr Gunner, DNP, FNP-BC Palliative Medicine   Please contact Palliative Medicine Team providers via [facility] for questions and concerns.

## 2024-10-22 NOTE — TOC Progression Note (Addendum)
 Transition of Care [facility]) - Progression Note    Patient Details  Name: [name] MRN: [id number] Date of Birth: 21-Oct-1935  [facility] CM/SW Contact  [name], RN Phone Number: 10/22/2024, 2:13 PM  Clinical Narrative:    RNCM presented to patient room. Patient was sitting up in the recliner. His wife was at the bedside. Advised patient that PT is again recommending he to to a snf for short term rehab. His wife is in favor of this. Patient was reticent, but said we could start a search. Patient said he would not commit until we have spoken to his daughter. Advised I would begin a facility search and we would talk to his daughter when we had more information.  Patient and wife said they would prefer [facility].     3:18 PM FL2 complete                 Expected Discharge Plan and Services                                               Social Drivers of Health (SDOH) Interventions SDOH Screenings   Food Insecurity: No Food Insecurity (10/16/2024)  Housing: Low Risk (10/17/2024)  Transportation Needs: No Transportation Needs (10/17/2024)  Utilities: Not At Risk (10/16/2024)  Depression (PHQ2-9): Low Risk (04/07/2022)  Financial Resource Strain: Low Risk (09/21/2024)   Received from [facility]  Social Connections: Moderately Integrated (10/16/2024)  Tobacco Use: Medium Risk (10/17/2024)    Readmission Risk Interventions    10/17/2024   10:32 AM 03/26/2024    4:09 PM 03/10/2023   12:47 PM  Readmission Risk Prevention Plan  Transportation Screening Complete Complete Complete  PCP or Specialist Appt within 3-5 Days   Complete  Social Work Consult for Recovery Care Planning/Counseling   Complete  Palliative Care Screening   Not Applicable  Medication Review Oceanographer) Complete Complete Complete  PCP or Specialist appointment within 3-5 days of discharge Complete Complete   HRI or Home Care Consult Complete Complete   SW  Recovery Care/Counseling Consult Complete Complete   Palliative Care Screening Not Applicable Not Applicable   Skilled Nursing Facility Not Applicable Not Applicable

## 2024-10-22 NOTE — Progress Notes (Signed)
 Physical Therapy Treatment Patient Details Name: [name] MRN: [id number] DOB: 11/08/1935 Today's Date: 10/22/2024   History of Present Illness [name] is a 89 y.o. male with medical history significant of PAF on Eliquis , sCHF with EF 35-40, HTN, HLD, DM, GERD, CKD-4, CAD, s/p of stent, CABG, cardiac arrest, complete heart block, s/p of pacemaker, GIB, who presents with shortness of breath. Admitted for management of acute on chronic systolic CHF.    PT Comments  Pt was lying in bed on arrival, was pleasant and somewhat motivated to participate during the session and put forth good effort throughout. Pt was able to mobilize to EOB with supervision, however, he needed increased time to negotiate trunk and LE. Once at EOB he was able to maintain upright posture, but needed cueing to scoot forward on EOB to support standing with RW. Pt reported R knee and L ankle pain throughout along with general weakness which hindered his ability to transfer to standing and ambulate. He was able to transfer to standing using RW, but needed min A +2 for physical support due to lack of trunk flexion, UE strength to push through bed, and LE strength to rise from standing. Pt reported feeling unsteady once standing, and was unable to fully accept weight through L foot. Pt was educated on step to sequencing with RW while dealing with L ankle pain (see details below). Pt tolerated stepping laterally at bedside without a lot of foot clearance, with max cueing and min A for safety. Pt was left in chair with call bell, phone, and legs elevated. Overall, pts mobility was very effortful and he required assistance for safety and sequencing throughout. Pt will benefit from continued PT services upon discharge to safely address deficits listed in patient problem list for decreased caregiver assistance and eventual return to PLOF.    If plan is discharge home, recommend the following: A little help with walking and/or  transfers;A little help with bathing/dressing/bathroom;Assistance with cooking/housework;Assist for transportation;Help with stairs or ramp for entrance   Can travel by private vehicle     Yes  Equipment Recommendations  BSC/3in1    Recommendations for Other Services       Precautions / Restrictions Precautions Precautions: Fall Recall of Precautions/Restrictions: Intact Restrictions Weight Bearing Restrictions Per Provider Order: No     Mobility  Bed Mobility Overal bed mobility: Needs Assistance Bed Mobility: Supine to Sit     Supine to sit: Supervision, HOB elevated, Used rails          Transfers Overall transfer level: Needs assistance Equipment used: Rolling walker (2 wheels) Transfers: Sit to/from Stand Sit to Stand: Min assist, +2 physical assistance           General transfer comment: max effort for sequencing trunk forward while sitting EOB. Pt needed increased time to push himself to standing with UEs, was unable to shift weight forward enough to do so on his own, and required min A +2 to stand. Required max cueing for hand placement on RW    Ambulation/Gait Ambulation/Gait assistance: Min assist Gait Distance (Feet): 15 Feet Assistive device: Rolling walker (2 wheels) Gait Pattern/deviations: Step-to pattern, Decreased stride length, Decreased stance time - left, Decreased step length - right Gait velocity: decreased     General Gait Details: very slow and effortful gait using step to pattern due to L ankle pain, required max cueing for pattern sequencing forwards and backwards. Had a difficult time picking his L foot up to advance foward, backward and  laterally   Stairs             Wheelchair Mobility     Tilt Bed    Modified Rankin (Stroke Patients Only)       Balance Overall balance assessment: Needs assistance Sitting-balance support: No upper extremity supported, Feet supported Sitting balance-[name]: Good   Postural  control: Posterior lean Standing balance support: Bilateral upper extremity supported, During functional activity, Reliant on assistive device for balance Standing balance-[name]: Poor Standing balance comment: unsteady on feet, unable to fully bear weight on L LE due to gout flare in L ankle and right knee pain                             Communication Communication Communication: Impaired Factors Affecting Communication: Reduced clarity of speech  Cognition Arousal: Alert Behavior During Therapy: WFL for tasks assessed/performed   PT - Cognitive impairments: No apparent impairments                         Following commands: Intact Following commands impaired: Follows one step commands with increased time    Cueing Cueing Techniques: Verbal cues, Gestural cues, Tactile cues, Visual cues  Exercises   Pt edu on step to pattern with RW to help alleviate painful step through while dealing with gout flare in L ankle   General Comments General comments (skin integrity, edema, etc.): SpO2 98% on RA      Pertinent Vitals/Pain Pain Assessment Pain Assessment: Faces Faces Pain Scale: Hurts little more Pain Location: L ankle, R knee Pain Descriptors / Indicators: Discomfort, Grimacing Pain Intervention(s): Monitored during session, Repositioned, Limited activity within patient's tolerance    Home Living                          Prior Function            PT Goals (current goals can now be found in the care plan section) Acute Rehab PT Goals Patient Stated Goal: to go home PT Goal Formulation: With patient Time For Goal Achievement: 11/02/24 Potential to Achieve Goals: Good Progress towards PT goals: Progressing toward goals    Frequency    Min 2X/week      PT Plan      Co-evaluation              AM-PAC PT 6 Clicks Mobility   Outcome Measure  Help needed turning from your back to your side while in a flat bed without  using bedrails?: A Little Help needed moving from lying on your back to sitting on the side of a flat bed without using bedrails?: A Little Help needed moving to and from a bed to a chair (including a wheelchair)?: A Lot Help needed standing up from a chair using your arms (e.g., wheelchair or bedside chair)?: A Lot Help needed to walk in hospital room?: A Lot Help needed climbing 3-5 steps with a railing? : A Lot 6 Click Score: 14    End of Session Equipment Utilized During Treatment: Gait belt Activity Tolerance: Patient limited by pain;Patient limited by fatigue Patient left: in chair;with call bell/phone within reach;with chair alarm set Nurse Communication: Mobility status PT Visit Diagnosis: Unsteadiness on feet (R26.81);Muscle weakness (generalized) (M62.81)     Time: 8869-8795 PT Time Calculation (min) (ACUTE ONLY): 34 min  Charges:  Corean Newport, SPT 10/22/2024, 1:45 PM

## 2024-10-22 NOTE — Progress Notes (Signed)
 PROGRESS NOTE    [name]  FMW:[id number] DOB: [birth year] DOA: 10/15/2024 PCP: [name], MD  Chief Complaint  Patient presents with   Respiratory Distress    Hospital Course:  [name] is an 89 year old male with paroxysmal A-fib on Eliquis , systolic heart failure with EF 35 to 40%, hypertension, hyperlipidemia, diabetes, GERD, CKD stage IV, CAD status post stenting and CABG, complete heart block status post pacemaker who presents on this admission with shortness of breath.  proBNP over 35,000, troponin 124 down to 114 on repeat.  Patient was admitted for acute on chronic systolic CHF exacerbation.  He appears very deconditioned and PT/OT is recommending SNF at [place].  Patient initially refused but has later agreed.  Subjective: This morning patient is about to begin work with physical therapy.  He reports he still has some left ankle pain.  He is able to demonstrate good range of motion though he is hesitant to begin ambulating on them.  Advised weightbearing as tolerated.  Objective: Vitals:   10/22/24 0018 10/22/24 0454 10/22/24 0500 10/22/24 0818  BP: 111/60 (!) 106/54  115/64  Pulse: 65 70  70  Resp: 17 18  18   Temp: 98 F (36.7 C) 98.7 F (37.1 C)  98.1 F (36.7 C)  TempSrc: Oral     SpO2: 97% 95%  95%  Weight:   105.6 kg   Height:        Intake/Output Summary (Last 24 hours) at 10/22/2024 1512 Last data filed at 10/22/2024 0919 Gross per 24 hour  Intake 120 ml  Output 1000 ml  Net -880 ml   Filed Weights   10/20/24 0500 10/21/24 0500 10/22/24 0500  Weight: 106.3 kg 106.3 kg 105.6 kg    Examination: General exam: Appears calm and comfortable, NAD  Respiratory system: No work of breathing, symmetric chest wall expansion Cardiovascular system: S1 & S2 heard, RRR.  Gastrointestinal system: Abdomen is nondistended, soft and nontender.  Neuro: Alert and oriented.  Extremities: Symmetric, expected ROM, 1+ pitting edema bilaterally, tender to palpation  in the medial aspect of the left malleolus, no significant erythema  Assessment & Plan:  Principal Problem:   Acute on chronic systolic CHF (congestive heart failure) (HCC) Active Problems:   Acute respiratory failure with hypoxia (HCC)   UTI (urinary tract infection)   Proctitis   CAD, s/p CABG x 5   Myocardial injury   HTN (hypertension)   HLD (hyperlipidemia)   Type II diabetes mellitus with renal manifestations (HCC)   Paroxysmal atrial fibrillation (HCC)   Elevated lactic acid level   Thrombocytopenia   CKD (chronic kidney disease), stage IV (HCC)   Hypothermia   Obesity (BMI 30-39.9)   Acute on chronic heart failure with reduced EF - Echo October 2025: EF 25 to 30% - Status post IV Lasix , -4 L so far. -Continue with torsemide , Aldactone , Entresto , carvedilol .  Additional IV diuresis as needed.  Left ankle pain, suspected gout flare - Patient denies any recent trauma. - Films negative for fracture or dislocation - Suspect patient is having a gout flare.  Continue short course of steroids.  E. coli UTI -- Urine culture with E. coli with some resistance.  Continue with IV cefepime   Gram-negative bacteremia - 1 out of 4 bottles positive for gram-negative rods.  Reportedly lab did not initially send species identification.  Prior attending called lab directly on 12/13 and requested species ID - Still no results for this, no evidence of this on the chart.  Have reached out to Pharm ID for better clarification - Continue with treatment for 7 days for now, await results  Staph capitis bacteremia - 1 out of 4 bottles positive for Staph capitis.  Likely contaminant - IV vancomycin  was discontinued - No growth on repeat blood cultures 12/11  Proctitis - On IV cefepime  and Flagyl , will complete 7-day course  Hypothermia - Resolved  Type 2 diabetes with hyperglycemia - Continue with sliding scale as needed.  Long-acting discontinued 12/9 - May have some hyperglycemia  with added steroids, titrate meds as needed   Paroxysmal A-fib - Continue Eliquis  and carvedilol   Generalized weakness - PT/OT  CKD stage IV - Kidney function appears to be near baseline  Hypertension - Resume home meds.  Titrate as needed  CAD status post CABG - Resume home meds  Dyslipidemia - Continue statin  Generalized weakness - PT/OT recommending SNF.  Agree with this recommendation.  TOC consulted for arrangements  Thrombocytopenia  Advanced age Poor prognosis - Patient has extensive cardiac history including severe heart failure with underlying CAD, CKD, and advanced age.  He plans to discuss his ongoing goals of care directly with his family.  He would like to remain full code for now.  Palliative care has been consulted  Body mass index is 35.4 kg/m (pended). Obesity Class II - Outpatient follow up for lifestyle modification and risk factor management   DVT prophylaxis: Eliquis    Code Status: Full Code Disposition:  Inpt pending clinical resolution, needs SNF  Consultants:    Procedures:    Antimicrobials:  Anti-infectives (From admission, onward)    Start     Dose/Rate Route Frequency Ordered Stop   10/20/24 0500  vancomycin  (VANCOREADY) IVPB 1500 mg/300 mL  Status:  Discontinued       Placed in Followed by Linked Group   1,500 mg 150 mL/hr over 120 Minutes Intravenous Every 48 hours 10/18/24 0353 10/19/24 0739   10/20/24 0500  vancomycin  (VANCOREADY) IVPB 1250 mg/250 mL  Status:  Discontinued       Placed in Followed by Linked Group   1,250 mg 166.7 mL/hr over 90 Minutes Intravenous Every 48 hours 10/19/24 0739 10/19/24 1347   10/18/24 0500  vancomycin  (VANCOREADY) IVPB 2000 mg/400 mL       Placed in Followed by Linked Group   2,000 mg 200 mL/hr over 120 Minutes Intravenous  Once 10/18/24 0353 10/18/24 0708   10/17/24 1000  metroNIDAZOLE  (FLAGYL ) tablet 500 mg        500 mg Oral Every 12 hours 10/17/24 0747     10/17/24 1000  ceFEPIme   (MAXIPIME ) 2 g in sodium chloride  0.9 % 100 mL IVPB        2 g 200 mL/hr over 30 Minutes Intravenous Every 24 hours 10/17/24 0829     10/16/24 2000  cefTRIAXone  (ROCEPHIN ) 2 g in sodium chloride  0.9 % 100 mL IVPB  Status:  Discontinued        2 g 200 mL/hr over 30 Minutes Intravenous Every 24 hours 10/16/24 0004 10/17/24 0829   10/16/24 0015  metroNIDAZOLE  (FLAGYL ) IVPB 500 mg  Status:  Discontinued        500 mg 100 mL/hr over 60 Minutes Intravenous Every 12 hours 10/16/24 0004 10/17/24 0747   10/15/24 2015  cefTRIAXone  (ROCEPHIN ) 2 g in sodium chloride  0.9 % 100 mL IVPB        2 g 200 mL/hr over 30 Minutes Intravenous Once 10/15/24 2011 10/15/24 2123   10/15/24 2015  azithromycin  (ZITHROMAX ) 500 mg in sodium chloride  0.9 % 250 mL IVPB        500 mg 250 mL/hr over 60 Minutes Intravenous  Once 10/15/24 2011 10/15/24 2202       Data Reviewed: I have personally reviewed following labs and imaging studies CBC: Recent Labs  Lab 10/15/24 1935 10/17/24 0425 10/18/24 1033 10/20/24 0134  WBC 5.6 4.8 4.6 6.1  NEUTROABS 4.0 3.0  --   --   HGB 15.9 13.6 12.5* 13.1  HCT 50.4 42.9 39.5 40.8  MCV 89.0 89.0 88.0 87.2  PLT 116* 113* 115* 124*   Basic Metabolic Panel: Recent Labs  Lab 10/15/24 1935 10/17/24 0425 10/18/24 1033 10/19/24 0431 10/20/24 0134  NA 144 143 139 140 138  K 3.6 3.5 4.1 4.3 4.0  CL 100 102 104 100 102  CO2 25 28 25 29 27   GLUCOSE 125* 111* 141* 155* 145*  BUN 72* 69* 66* 66* 68*  CREATININE 2.87* 2.72* 2.67* 2.61* 2.70*  CALCIUM  8.5* 7.3* 7.2* 7.7* 7.3*  PHOS  --   --   --  3.8  --    GFR: Estimated Creatinine Clearance: 22.7 mL/min (A) (by C-G formula based on SCr of 2.7 mg/dL (H)). Liver Function Tests: Recent Labs  Lab 10/15/24 1935 10/19/24 0431  AST 29  --   ALT 8  --   ALKPHOS 85  --   BILITOT 1.0  --   PROT 8.3*  --   ALBUMIN  4.4 3.2*   CBG: Recent Labs  Lab 10/21/24 1257 10/21/24 1622 10/21/24 1951 10/22/24 0815 10/22/24 1221   GLUCAP 162* 202* 131* 135* 145*    Recent Results (from the past 240 hours)  Resp panel by RT-PCR (RSV, Flu A&B, Covid) Anterior Nasal Swab     Status: None   Collection Time: 10/15/24  7:35 PM   Specimen: Anterior Nasal Swab  Result Value Ref Range Status   SARS Coronavirus 2 by RT PCR NEGATIVE NEGATIVE Final    Comment: (NOTE) SARS-CoV-2 target nucleic acids are NOT DETECTED.  The SARS-CoV-2 RNA is generally detectable in upper respiratory specimens during the acute phase of infection. The lowest concentration of SARS-CoV-2 viral copies this assay can detect is 138 copies/mL. A negative result does not preclude SARS-Cov-2 infection and should not be used as the sole basis for treatment or other patient management decisions. A negative result may occur with  improper specimen collection/handling, submission of specimen other than nasopharyngeal swab, presence of viral mutation(s) within the areas targeted by this assay, and inadequate number of viral copies(<138 copies/mL). A negative result must be combined with clinical observations, patient history, and epidemiological information. The expected result is Negative.  Fact Sheet for Patients:  bloggercourse.com  Fact Sheet for Healthcare Providers:  seriousbroker.it  This test is no t yet approved or cleared by the [place]  FDA and  has been authorized for detection and/or diagnosis of SARS-CoV-2 by FDA under an Emergency Use Authorization (EUA). This EUA will remain  in effect (meaning this test can be used) for the duration of the COVID-19 declaration under Section 564(b)(1) of the Act, 21 U.S.C.section 360bbb-3(b)(1), unless the authorization is terminated  or revoked sooner.       Influenza A by PCR NEGATIVE NEGATIVE Final   Influenza B by PCR NEGATIVE NEGATIVE Final    Comment: (NOTE) The Xpert Xpress SARS-CoV-2/FLU/RSV plus assay is intended as an aid in the  diagnosis of influenza from Nasopharyngeal swab specimens and should not  be used as a sole basis for treatment. Nasal washings and aspirates are unacceptable for Xpert Xpress SARS-CoV-2/FLU/RSV testing.  Fact Sheet for Patients: bloggercourse.com  Fact Sheet for Healthcare Providers: seriousbroker.it  This test is not yet approved or cleared by the [place]  FDA and has been authorized for detection and/or diagnosis of SARS-CoV-2 by FDA under an Emergency Use Authorization (EUA). This EUA will remain in effect (meaning this test can be used) for the duration of the COVID-19 declaration under Section 564(b)(1) of the Act, 21 U.S.C. section 360bbb-3(b)(1), unless the authorization is terminated or revoked.     Resp Syncytial Virus by PCR NEGATIVE NEGATIVE Final    Comment: (NOTE) Fact Sheet for Patients: bloggercourse.com  Fact Sheet for Healthcare Providers: seriousbroker.it  This test is not yet approved or cleared by the [place]  FDA and has been authorized for detection and/or diagnosis of SARS-CoV-2 by FDA under an Emergency Use Authorization (EUA). This EUA will remain in effect (meaning this test can be used) for the duration of the COVID-19 declaration under Section 564(b)(1) of the Act, 21 U.S.C. section 360bbb-3(b)(1), unless the authorization is terminated or revoked.  Performed at [facility], [address], [place], [place] [address]   Blood Culture (routine x 2)     Status: Abnormal   Collection Time: 10/15/24  7:41 PM   Specimen: BLOOD  Result Value Ref Range Status   Specimen Description   Final    BLOOD BLOOD LEFT FOREARM Performed at [facility], [address] [place], [place] [address]    Special Requests   Final    BOTTLES DRAWN AEROBIC AND ANAEROBIC Blood Culture results may not be optimal due to an inadequate volume of  blood received in culture bottles Performed at [facility], [address] [place], [place] [address]    Culture  Setup Time   Final    GRAM POSITIVE COCCI ANAEROBIC BOTTLE ONLY CRITICAL RESULT CALLED TO, READ BACK BY AND VERIFIED WITH:  CATHALEEN PATEL AT 9664 10/18/24 JG    Culture (A)  Final    STAPHYLOCOCCUS CAPITIS THE SIGNIFICANCE OF ISOLATING THIS ORGANISM FROM A SINGLE SET OF BLOOD CULTURES WHEN MULTIPLE SETS ARE DRAWN IS UNCERTAIN. PLEASE NOTIFY THE MICROBIOLOGY DEPARTMENT WITHIN ONE WEEK IF SPECIATION AND SENSITIVITIES ARE REQUIRED. Performed at [facility], [address], [place], [place] [address]    Report Status 10/20/2024 FINAL  Final  Blood Culture (routine x 2)     Status: None (Preliminary result)   Collection Time: 10/15/24  7:41 PM   Specimen: BLOOD  Result Value Ref Range Status   Specimen Description   Final    BLOOD BLOOD RIGHT FOREARM Performed at [facility], [address], [place], [place] [address]    Special Requests   Final    BOTTLES DRAWN AEROBIC AND ANAEROBIC Blood Culture results may not be optimal due to an inadequate volume of blood received in culture bottles Performed at [facility], [address], [place], [place] [address]    Culture  Setup Time   Final    GRAM NEGATIVE RODS AEROBIC BOTTLE ONLY Organism ID to follow CRITICAL RESULT CALLED TO, READ BACK BY AND VERIFIED WITH: WILL LENON 10/17/24 0814 MW Performed at [facility], [address] [place], [place] [address]    Culture   Final    VONNE NEGATIVE RODS CULTURE REINCUBATED FOR BETTER GROWTH Performed at [facility], [address]  [place], [place] [address]    Report Status PENDING  Incomplete  Blood Culture ID Panel (Reflexed)     Status: None   Collection Time: 10/15/24  7:41 PM  Result Value Ref Range Status   Enterococcus faecalis NOT DETECTED NOT DETECTED Final   Enterococcus Faecium NOT DETECTED NOT DETECTED  Final   Listeria monocytogenes NOT DETECTED NOT DETECTED Final   Staphylococcus species NOT DETECTED NOT DETECTED Final   Staphylococcus aureus (BCID) NOT DETECTED NOT DETECTED Final   Staphylococcus epidermidis NOT DETECTED NOT DETECTED Final   Staphylococcus lugdunensis NOT DETECTED NOT DETECTED Final   Streptococcus species NOT DETECTED NOT DETECTED Final   Streptococcus agalactiae NOT DETECTED NOT DETECTED Final   Streptococcus pneumoniae NOT DETECTED NOT DETECTED Final   Streptococcus pyogenes NOT DETECTED NOT DETECTED Final   A.calcoaceticus-baumannii NOT DETECTED NOT DETECTED Final   Bacteroides fragilis NOT DETECTED NOT DETECTED Final   Enterobacterales NOT DETECTED NOT DETECTED Final   Enterobacter cloacae complex NOT DETECTED NOT DETECTED Final   Escherichia coli NOT DETECTED NOT DETECTED Final   Klebsiella aerogenes NOT DETECTED NOT DETECTED Final   Klebsiella oxytoca NOT DETECTED NOT DETECTED Final   Klebsiella pneumoniae NOT DETECTED NOT DETECTED Final   Proteus species NOT DETECTED NOT DETECTED Final   Salmonella species NOT DETECTED NOT DETECTED Final   Serratia marcescens NOT DETECTED NOT DETECTED Final   Haemophilus influenzae NOT DETECTED NOT DETECTED Final   Neisseria meningitidis NOT DETECTED NOT DETECTED Final   Pseudomonas aeruginosa NOT DETECTED NOT DETECTED Final   Stenotrophomonas maltophilia NOT DETECTED NOT DETECTED Final   Candida albicans NOT DETECTED NOT DETECTED Final   Candida auris NOT DETECTED NOT DETECTED Final   Candida glabrata NOT DETECTED NOT DETECTED Final   Candida krusei NOT DETECTED NOT DETECTED Final   Candida parapsilosis NOT DETECTED NOT DETECTED Final   Candida tropicalis NOT DETECTED NOT DETECTED Final   Cryptococcus neoformans/gattii NOT DETECTED NOT DETECTED Final    Comment: Performed at [facility], [address] [place], [place] [address]  Blood Culture ID Panel (Reflexed)     Status: Abnormal   Collection Time:  10/15/24  7:41 PM  Result Value Ref Range Status   Enterococcus faecalis NOT DETECTED NOT DETECTED Final   Enterococcus Faecium NOT DETECTED NOT DETECTED Final   Listeria monocytogenes NOT DETECTED NOT DETECTED Final   Staphylococcus species DETECTED (A) NOT DETECTED Final    Comment: CRITICAL RESULT CALLED TO, READ BACK BY AND VERIFIED WITH:  CATHALEEN BLANCH AT 0335 10/18/24 JG    Staphylococcus aureus (BCID) NOT DETECTED NOT DETECTED Final   Staphylococcus epidermidis NOT DETECTED NOT DETECTED Final   Staphylococcus lugdunensis NOT DETECTED NOT DETECTED Final   Streptococcus species NOT DETECTED NOT DETECTED Final   Streptococcus agalactiae NOT DETECTED NOT DETECTED Final   Streptococcus pneumoniae NOT DETECTED NOT DETECTED Final   Streptococcus pyogenes NOT DETECTED NOT DETECTED Final   A.calcoaceticus-baumannii NOT DETECTED NOT DETECTED Final   Bacteroides fragilis NOT DETECTED NOT DETECTED Final   Enterobacterales NOT DETECTED NOT DETECTED Final   Enterobacter cloacae complex NOT DETECTED NOT DETECTED Final   Escherichia coli NOT DETECTED NOT DETECTED Final   Klebsiella aerogenes NOT DETECTED NOT DETECTED Final   Klebsiella oxytoca NOT DETECTED NOT DETECTED Final   Klebsiella pneumoniae NOT DETECTED NOT DETECTED Final   Proteus species NOT DETECTED NOT DETECTED Final   Salmonella species NOT DETECTED NOT DETECTED Final   Serratia marcescens NOT DETECTED NOT DETECTED Final  Haemophilus influenzae NOT DETECTED NOT DETECTED Final   Neisseria meningitidis NOT DETECTED NOT DETECTED Final   Pseudomonas aeruginosa NOT DETECTED NOT DETECTED Final   Stenotrophomonas maltophilia NOT DETECTED NOT DETECTED Final   Candida albicans NOT DETECTED NOT DETECTED Final   Candida auris NOT DETECTED NOT DETECTED Final   Candida glabrata NOT DETECTED NOT DETECTED Final   Candida krusei NOT DETECTED NOT DETECTED Final   Candida parapsilosis NOT DETECTED NOT DETECTED Final   Candida tropicalis NOT  DETECTED NOT DETECTED Final   Cryptococcus neoformans/gattii NOT DETECTED NOT DETECTED Final    Comment: Performed at [facility], [address], [place], [place] [address]  Urine Culture     Status: Abnormal   Collection Time: 10/15/24  9:29 PM   Specimen: Urine, Random  Result Value Ref Range Status   Specimen Description   Final    URINE, RANDOM Performed at [facility], [address] [place], [place] [address]    Special Requests   Final    NONE Reflexed from [phone] Performed at [facility], [address] [place], [place] [address]    Culture >=100,000 COLONIES/mL ESCHERICHIA COLI (A)  Final   Report Status 10/18/2024 FINAL  Final   Organism ID, Bacteria ESCHERICHIA COLI (A)  Final      Susceptibility   Escherichia coli - MIC*    AMPICILLIN  >=32 RESISTANT Resistant     CEFAZOLIN  (URINE) Value in next row Sensitive      8 SENSITIVEThis is a modified FDA-approved test that has been validated and its performance characteristics determined by the reporting laboratory.  This laboratory is certified under the Clinical Laboratory Improvement Amendments CLIA as qualified to perform high complexity clinical laboratory testing.    CEFEPIME  Value in next row Sensitive      8 SENSITIVEThis is a modified FDA-approved test that has been validated and its performance characteristics determined by the reporting laboratory.  This laboratory is certified under the Clinical Laboratory Improvement Amendments CLIA as qualified to perform high complexity clinical laboratory testing.    ERTAPENEM Value in next row Sensitive      8 SENSITIVEThis is a modified FDA-approved test that has been validated and its performance characteristics determined by the reporting laboratory.  This laboratory is certified under the Clinical Laboratory Improvement Amendments CLIA as qualified to perform high complexity clinical laboratory testing.    CEFTRIAXONE  Value in next row Sensitive       8 SENSITIVEThis is a modified FDA-approved test that has been validated and its performance characteristics determined by the reporting laboratory.  This laboratory is certified under the Clinical Laboratory Improvement Amendments CLIA as qualified to perform high complexity clinical laboratory testing.    CIPROFLOXACIN  Value in next row Resistant      8 SENSITIVEThis is a modified FDA-approved test that has been validated and its performance characteristics determined by the reporting laboratory.  This laboratory is certified under the Clinical Laboratory Improvement Amendments CLIA as qualified to perform high complexity clinical laboratory testing.    GENTAMICIN  Value in next row Sensitive      8 SENSITIVEThis is a modified FDA-approved test that has been validated and its performance characteristics determined by the reporting laboratory.  This laboratory is certified under the Clinical Laboratory Improvement Amendments CLIA as qualified to perform high complexity clinical laboratory testing.    NITROFURANTOIN Value in next row Sensitive      8 SENSITIVEThis is a modified FDA-approved test that has been validated and  its performance characteristics determined by the reporting laboratory.  This laboratory is certified under the Clinical Laboratory Improvement Amendments CLIA as qualified to perform high complexity clinical laboratory testing.    TRIMETH/SULFA Value in next row Resistant      8 SENSITIVEThis is a modified FDA-approved test that has been validated and its performance characteristics determined by the reporting laboratory.  This laboratory is certified under the Clinical Laboratory Improvement Amendments CLIA as qualified to perform high complexity clinical laboratory testing.    AMPICILLIN /SULBACTAM Value in next row Resistant      8 SENSITIVEThis is a modified FDA-approved test that has been validated and its performance characteristics determined by the reporting laboratory.  This  laboratory is certified under the Clinical Laboratory Improvement Amendments CLIA as qualified to perform high complexity clinical laboratory testing.    PIP/TAZO Value in next row Sensitive      <=4 SENSITIVEThis is a modified FDA-approved test that has been validated and its performance characteristics determined by the reporting laboratory.  This laboratory is certified under the Clinical Laboratory Improvement Amendments CLIA as qualified to perform high complexity clinical laboratory testing.    MEROPENEM Value in next row Sensitive      <=4 SENSITIVEThis is a modified FDA-approved test that has been validated and its performance characteristics determined by the reporting laboratory.  This laboratory is certified under the Clinical Laboratory Improvement Amendments CLIA as qualified to perform high complexity clinical laboratory testing.    * >=100,000 COLONIES/mL ESCHERICHIA COLI  Culture, blood (Routine X 2) w Reflex to ID Panel     Status: None (Preliminary result)   Collection Time: 10/18/24 10:33 AM   Specimen: BLOOD  Result Value Ref Range Status   Specimen Description BLOOD BLOOD RIGHT HAND  Final   Special Requests   Final    BOTTLES DRAWN AEROBIC AND ANAEROBIC Blood Culture adequate volume   Culture   Final    NO GROWTH 4 DAYS Performed at [facility], [address] [place], [place] [address]    Report Status PENDING  Incomplete  Culture, blood (Routine X 2) w Reflex to ID Panel     Status: None (Preliminary result)   Collection Time: 10/18/24 10:33 AM   Specimen: BLOOD  Result Value Ref Range Status   Specimen Description BLOOD BLOOD LEFT HAND  Final   Special Requests   Final    BOTTLES DRAWN AEROBIC AND ANAEROBIC Blood Culture adequate volume   Culture   Final    NO GROWTH 4 DAYS Performed at [facility], [address], [place], [place] [address]    Report Status PENDING  Incomplete     Radiology Studies: DG Ankle 2 Views Left Result Date:  10/21/2024 CLINICAL DATA:  144615 Pain [id number] EXAM: LEFT ANKLE - 2 VIEW COMPARISON:  None Available. FINDINGS: No acute fracture or dislocation. Ankle mortise is preserved. No definitive area of erosion or osseous destruction. No unexpected radiopaque foreign body. Vascular calcifications, severe. IMPRESSION: 1. No acute fracture or dislocation. Electronically Signed   By: Corean Salter M.D.   On: 10/21/2024 16:28    Scheduled Meds:  apixaban   2.5 mg Oral BID   carvedilol   12.5 mg Oral BID WC   insulin  aspart  0-5 Units Subcutaneous QHS   insulin  aspart  0-9 Units Subcutaneous TID WC   metroNIDAZOLE   500 mg Oral Q12H   pantoprazole   40 mg Oral BID   predniSONE   20 mg Oral Q breakfast   rosuvastatin   40 mg Oral Daily   sacubitril -valsartan   1 tablet Oral BID   spironolactone   25 mg Oral Daily   tamsulosin   0.4 mg Oral Daily   torsemide   40 mg Oral Daily   Continuous Infusions:  ceFEPime  (MAXIPIME ) IV 2 g (10/22/24 1004)     LOS: 7 days  MDM: Patient is high risk for one or more organ failure.  They necessitate ongoing hospitalization for continued IV therapies and subsequent lab monitoring. Total time spent interpreting labs and vitals, reviewing the medical record, coordinating care amongst consultants and care team members, directly assessing and discussing care with the patient and/or family: 55 min  Skilynn Durney, DO [facility]  To contact the attending physician between 7A-7P please use Epic Chat. To contact the covering physician during after hours 7P-7A, please review Amion.  10/22/2024, 3:12 PM   *This document has been created with the assistance of dictation software. Please excuse typographical errors. *

## 2024-10-23 ENCOUNTER — Other Ambulatory Visit: Payer: Self-pay

## 2024-10-23 ENCOUNTER — Ambulatory Visit

## 2024-10-23 DIAGNOSIS — I442 Atrioventricular block, complete: Secondary | ICD-10-CM

## 2024-10-23 DIAGNOSIS — Z794 Long term (current) use of insulin: Secondary | ICD-10-CM

## 2024-10-23 DIAGNOSIS — E1122 Type 2 diabetes mellitus with diabetic chronic kidney disease: Secondary | ICD-10-CM

## 2024-10-23 DIAGNOSIS — N1832 Chronic kidney disease, stage 3b: Secondary | ICD-10-CM

## 2024-10-23 LAB — RENAL FUNCTION PANEL
Albumin: 2.9 g/dL — ABNORMAL LOW (ref 3.5–5.0)
Anion gap: 9 (ref 5–15)
BUN: 73 mg/dL — ABNORMAL HIGH (ref 8–23)
CO2: 29 mmol/L (ref 22–32)
Calcium: 7.8 mg/dL — ABNORMAL LOW (ref 8.9–10.3)
Chloride: 98 mmol/L (ref 98–111)
Creatinine, Ser: 2.87 mg/dL — ABNORMAL HIGH (ref 0.61–1.24)
GFR, Estimated: 21 mL/min — ABNORMAL LOW (ref >60)
Glucose, Bld: 202 mg/dL — ABNORMAL HIGH (ref 70–99)
Phosphorus: 4 mg/dL (ref 2.5–4.6)
Potassium: 4.7 mmol/L (ref 3.5–5.1)
Sodium: 137 mmol/L (ref 135–145)

## 2024-10-23 LAB — GLUCOSE, CAPILLARY
Glucose-Capillary: 187 mg/dL — ABNORMAL HIGH (ref 70–99)
Glucose-Capillary: 183 mg/dL — ABNORMAL HIGH (ref 70–99)

## 2024-10-23 LAB — CULTURE, BLOOD (ROUTINE X 2)
Culture: NO GROWTH
Culture: NO GROWTH
Special Requests: ADEQUATE
Special Requests: ADEQUATE

## 2024-10-23 MED ORDER — PREDNISONE 20 MG PO TABS
20.0000 mg | ORAL_TABLET | Freq: Every day | ORAL | 0 refills | Status: AC
  Filled 2024-10-23: qty 2, 2d supply, fill #0

## 2024-10-23 NOTE — TOC Progression Note (Signed)
 Transition of Care [facility]) - Progression Note    Patient Details  Name: [name] MRN: [id number] Date of Birth: Jan 03, 1935  Transition of Care [facility]) CM/SW Contact  [name], RN Phone Number: 10/23/2024, 1:31 PM  Clinical Narrative:     Notified by MD that patient has changed his mind and does not want to go to SNF for rehab. His family is in agreement. HH search started.   Notified Mitch with [employer] that patient has orders for 3 in 1 and standard wheelchair. Adding narrative for 3 in 1.                    Expected Discharge Plan and Services                                               Social Drivers of Health (SDOH) Interventions SDOH Screenings   Food Insecurity: No Food Insecurity (10/16/2024)  Housing: Low Risk (10/17/2024)  Transportation Needs: No Transportation Needs (10/17/2024)  Utilities: Not At Risk (10/16/2024)  Depression (PHQ2-9): Low Risk (04/07/2022)  Financial Resource Strain: Low Risk (09/21/2024)   [facility]  Social Connections: Moderately Integrated (10/16/2024)  Tobacco Use: Medium Risk (10/17/2024)    Readmission Risk Interventions    10/17/2024   10:32 AM 03/26/2024    4:09 PM 03/10/2023   12:47 PM  Readmission Risk Prevention Plan  Transportation Screening Complete Complete Complete  PCP or Specialist Appt within 3-5 Days   Complete  Social Work Consult for Recovery Care Planning/Counseling   Complete  Palliative Care Screening   Not Applicable  Medication Review Oceanographer) Complete Complete Complete  PCP or Specialist appointment within 3-5 days of discharge Complete Complete   HRI or Home Care Consult Complete Complete   SW Recovery Care/Counseling Consult Complete Complete   Palliative Care Screening Not Applicable Not Applicable   Skilled Nursing Facility Not Applicable Not Applicable

## 2024-10-23 NOTE — Plan of Care (Signed)

## 2024-10-23 NOTE — Discharge Summary (Signed)
 DISCHARGE SUMMARY    [name] FMW:[id number] DOB: 09-Aug-1935 DOA: 10/15/2024  PCP: [name], MD  Admit date: 10/15/2024 Discharge date: 10/23/2024   Recommendations for Outpatient Follow-up:  Follow up with PCP in 1-2 weeks to continue chronic condition management  Hospital Course: [name] is an 89 year old male with paroxysmal A-fib on Eliquis , systolic heart failure with EF 35 to 40%, hypertension, hyperlipidemia, diabetes, GERD, CKD stage IV, CAD status post stenting and CABG, complete heart block status post pacemaker who presents on this admission with shortness of breath.  proBNP over 35,000, troponin 124 down to 114 on repeat.  Patient was admitted for acute on chronic systolic CHF exacerbation.  He received adequate diuresis.  Stay was then delayed as patient changed his mind multiple times regarding SNF versus home health at discharge.  We are pending SNF placement but today patient has decided he would like to go home with home health instead. I have confirmed this plan with his daughter who reports that they will have 24/7 assistance in his house by means of multiple family members.  I have also ordered for home health services.  TOC aware and is making these arrangements.  Acute on chronic heart failure with reduced EF - Echo October 2025: EF 25 to 30% - Status post IV Lasix , -4 L so far. - Continue with all home meds.  Additional diuresis as needed   Left ankle gout flare - Significant improvement with steroids.  Will complete course with an additional 2 days of DC - Patient denies any recent trauma. - Films negative for fracture or dislocation   E. coli UTI -- Urine culture with E. coli with some resistance.  Status post 7 days treatment   Gram-negative bacteremia - 1 out of 4 bottles positive for gram-negative rods.  We have requested specific ID, lab reports there is a split between Moraxella and enhydrobacter (rare and opportunistic). I have  discussed this with clinical pharmacy.  Given patient has negative repeat blood cultures and improved on antibiotics we will complete a 7-day course and consider this treated.   Staph capitis bacteremia - 1 out of 4 bottles positive for Staph capitis.  Likely contaminant - IV vancomycin  was discontinued - No growth on repeat blood cultures 12/11   Proctitis - Status Post 7 days antibiotics   Hypothermia - Resolved   Type 2 diabetes with hyperglycemia - Continue home meds   Paroxysmal A-fib - Continue Eliquis  and carvedilol    Generalized weakness - PT/OT, would benefit from skilled nursing facility.  He is refusing.  Discharging home with home health at family request and patient request   CKD stage IV - Kidney function appears to be near baseline   Hypertension - Resume home meds.  Titrate as needed   CAD status post CABG - Resume home meds   Dyslipidemia - Continue statin   Generalized weakness - PT/OT - Home health per patient preference as above   Thrombocytopenia   Advanced age Poor prognosis - Patient has extensive cardiac history including severe heart failure with underlying CAD, CKD, and advanced age.  He plans to discuss his ongoing goals of care directly with his family.  He would like to remain full code for now.  Palliative care has been consulted   Body mass index is 35.4 kg/m (pended). Obesity Class II - Outpatient follow up for lifestyle modification and risk factor management  Discharge Instructions  Discharge Instructions     Call MD for:  difficulty breathing,  headache or visual disturbances   Complete by: As directed    Call MD for:  persistant dizziness or light-headedness   Complete by: As directed    Call MD for:  persistant nausea and vomiting   Complete by: As directed    Call MD for:  severe uncontrolled pain   Complete by: As directed    Call MD for:  temperature >100.4   Complete by: As directed    Diet general   Complete by: As  directed    Discharge instructions   Complete by: As directed    Follow up with your primary care physician to discuss the medication changes during this admission   Increase activity slowly   Complete by: As directed       Allergies as of 10/23/2024       Reactions   Angiotensin Receptor Blockers    hyperkalemia   Metformin Diarrhea        Medication List     STOP taking these medications    magnesium  oxide 400 MG tablet Commonly known as: MAG-OX       TAKE these medications    acetaminophen  500 MG tablet Commonly known as: TYLENOL  Take 1,000 mg by mouth every 6 (six) hours as needed for mild pain, fever or headache.   acyclovir  800 MG tablet Commonly known as: ZOVIRAX  Take 800 mg by mouth daily.   albuterol  108 (90 Base) MCG/ACT inhaler Commonly known as: VENTOLIN  HFA Inhale 2 puffs into the lungs every 6 (six) hours as needed for wheezing or shortness of breath.   Basaglar  KwikPen 100 UNIT/ML Inject 15 Units into the skin at bedtime.   carvedilol  12.5 MG tablet Commonly known as: COREG  Take 1 tablet (12.5 mg total) by mouth 2 (two) times daily with a meal.   dapagliflozin  propanediol 10 MG Tabs tablet Commonly known as: Farxiga  Take 1 tablet (10 mg total) by mouth daily.   diclofenac Sodium 1 % Gel Commonly known as: VOLTAREN Apply 2 g topically 4 (four) times daily.   Eliquis  2.5 MG Tabs tablet Generic drug: apixaban  TAKE 1 TABLET BY MOUTH TWICE A DAY   ezetimibe  10 MG tablet Commonly known as: ZETIA  TAKE 1 TABLET BY MOUTH EVERY DAY   metolazone  2.5 MG tablet Commonly known as: ZAROXOLYN  Take 2.5 mg by mouth 2 (two) times a week. Every Tuesday and Friday   nitroGLYCERIN  0.4 MG SL tablet Commonly known as: NITROSTAT  Place 0.4 mg under the tongue every 5 (five) minutes as needed for chest pain.   pantoprazole  40 MG tablet Commonly known as: PROTONIX  Take 1 tablet (40 mg total) by mouth 2 (two) times daily.   polyethylene glycol 17 g  packet Commonly known as: MIRALAX  / GLYCOLAX  Take 17 g by mouth daily as needed for moderate constipation.   potassium chloride  SA 20 MEQ tablet Commonly known as: KLOR-CON  M Take 2 tablets (40 mEq total) by mouth daily.   predniSONE  20 MG tablet Commonly known as: DELTASONE  Take 1 tablet (20 mg total) by mouth daily with breakfast for 2 days. Start taking on: October 24, 2024   rosuvastatin  40 MG tablet Commonly known as: CRESTOR  Take 1 tablet (40 mg total) by mouth daily. What changed: Another medication with the same name was removed. Continue taking this medication, and follow the directions you see here.   sacubitril -valsartan  49-51 MG Commonly known as: ENTRESTO  Take 1 tablet by mouth 2 (two) times daily.   spironolactone  25 MG tablet Commonly known as:  ALDACTONE  Take 1 tablet (25 mg total) by mouth daily.   tamsulosin  0.4 MG Caps capsule Commonly known as: FLOMAX  Take 0.4 mg by mouth daily.   torsemide  20 MG tablet Commonly known as: DEMADEX  Take 40-60 mg by mouth daily. Alternating days 40 mg and 60 mg               Durable Medical Equipment  (From admission, onward)           Start     Ordered   10/23/24 1257  For home use only DME standard manual wheelchair with seat cushion  Once       Comments: Patient suffers from weakness which impairs their ability to perform daily activities like dressing in the home.  A walker will not resolve issue with performing activities of daily living. A wheelchair will allow patient to safely perform daily activities. Patient can safely propel the wheelchair in the home or has a caregiver who can provide assistance. Length of need Lifetime. Accessories: elevating leg rests (ELRs), wheel locks, extensions and anti-tippers.   10/23/24 1327            Follow-up Information     [facility]. Go on 11/13/2024.   Specialty: Cardiology Why: Hospital Follow-Up 11/13/24 @ 8:30  AM Please bring all medications to follow-up appointment [facility], [address] Second Floor Free Valet Parking at the door Contact information: [address] [place]  [address] [phone]               Allergies[1]  Consultations:    Procedures/Studies: DG Ankle 2 Views Left Result Date: 10/21/2024 CLINICAL DATA:  144615 Pain 144615 EXAM: LEFT ANKLE - 2 VIEW COMPARISON:  None Available. FINDINGS: No acute fracture or dislocation. Ankle mortise is preserved. No definitive area of erosion or osseous destruction. No unexpected radiopaque foreign body. Vascular calcifications, severe. IMPRESSION: 1. No acute fracture or dislocation. Electronically Signed   By: Corean Salter M.D.   On: 10/21/2024 16:28   CT CHEST ABDOMEN PELVIS WO CONTRAST Result Date: 10/15/2024 EXAM: CT CHEST, ABDOMEN AND PELVIS WITHOUT CONTRAST 10/15/2024 10:40:17 PM TECHNIQUE: CT of the chest, abdomen and pelvis was performed without the administration of intravenous contrast. Multiplanar reformatted images are provided for review. Automated exposure control, iterative reconstruction, and/or weight based adjustment of the mA/kV was utilized to reduce the radiation dose to as low as reasonably achievable. COMPARISON: None available. CLINICAL HISTORY: Sepsis; abd pain, dyspnea, hypoxia, headache, ams. FINDINGS: CHEST: MEDIASTINUM AND LYMPH NODES: - Status post coronary artery bypass grafting. - Status post left atrial clipping. - Mild-to-moderate cardiomegaly. - A left subclavian dual-lead pacemaker is identified with leads within the right atrium and right ventricle toward the apex. - No pericardial effusion. - The central pulmonary arteries are enlarged in keeping with changes of pulmonary arterial hypertension. - Mild atherosclerotic calcification within the thoracic aorta. - No aortic aneurysm. - The central airways are clear. - No mediastinal, hilar or axillary  lymphadenopathy. LUNGS AND PLEURA: - Small bilateral pleural effusions. - Bibasilar compressive atelectasis. - Tracheobronchomalacia with marked flattening of the central airways on this expiratory phase examination. - Diffuse ground-glass opacity likely represents atelectasis. - No pneumothorax. ABDOMEN AND PELVIS: LIVER: - Simple cyst within the inferior right hepatic lobe, unchanged. - Liver otherwise unremarkable in this noncontrast examination. GALLBLADDER AND BILE DUCTS: - Evaluation of the gallbladder is slightly limited by noncontrast technique and adjacent ascites, however, the gallbladder is  otherwise unremarkable. - No biliary ductal dilatation. SPLEEN: - No acute abnormality. PANCREAS: - No acute abnormality. ADRENAL GLANDS: - No acute abnormality. KIDNEYS, URETERS AND BLADDER: - Kidneys are normal in size and position. - Multiple simple cortical cysts are seen within the kidneys bilaterally measuring up to 10 cm arising exophytically from the lower pole of the right kidney. Per consensus, no follow-up is needed for simple Bosniak type 1 and 2 renal cysts, unless the patient has a malignancy history or risk factors. - Asymmetric moderate left renal cortical atrophy. - The kidneys are otherwise unremarkable. - No stones in the kidneys or ureters. - No hydronephrosis. - No perinephric or periureteral stranding. - Circumferential bladder wall thickening and mild perivesicular inflammatory stranding may relate to a diffuse infectious or inflammatory cystitis. Correlation with urinalysis and urine culture may be helpful for further management. GI AND BOWEL: - Circumferential rectal wall thickening and perirectal edema may reflect changes of infectious or inflammatory proctitis. - No obstruction or perforation. - Severe descending and sigmoid colonic diverticulosis. - Scattered diverticula throughout seen throughout the remainder of the colon. - No superimposed definite acute inflammatory change. - The  stomach, small bowel, and large bowel are otherwise unremarkable. - Status post appendectomy. REPRODUCTIVE ORGANS: - No acute abnormality. PERITONEUM AND RETROPERITONEUM: - Mild ascites, new since prior examination. - Small right mid abdominal parasagittal fat and fluid containing ventral hernia. - Extensive retroperitoneal edema. This is new from prior examination. - No free air. VASCULATURE: - Aorta is normal in caliber. - Moderate aortic iliac atherosclerotic calcification. ABDOMINAL AND PELVIS LYMPH NODES: - No lymphadenopathy. BONES AND SOFT TISSUES: - 11.8 cm right lateral abdominal wall intramuscular lipoma. - Marked bilateral gynecomastia. - Extensive subcutaneous body wall edema within the upper thoracic body wall. - Extensively diffuse body wall edema. - Osseous structures are age appropriate. - No acute bone abnormality. - No lytic or blastic bone lesion. - Complex peripherally calcified mass is seen subjacent to the scapula with areas of macroscopic fat internally consistent with myositis ossificans. IMPRESSION: 1. Circumferential bladder wall thickening with mild perivesicular inflammatory stranding, possibly related to diffuse infectious or inflammatory cystitis. Correlation with urinalysis and urine culture may be helpful for further management. 2. Circumferential rectal wall thickening with perirectal edema, possibly related to infectious or inflammatory proctitis. 3. Mild ascites, and extensive retroperitoneal and body wall edema, new from prior examination. Small but enlarging bilateral pleural effusions with bibasilar compressive atelectasis. 4. Mild-to-moderate cardiomegaly with prior coronary artery bypass grafting and left subclavian dual-lead pacemaker in place. 5. Severe descending and sigmoid colonic diverticulosis without definite acute inflammatory change. 6. Raf score: Aortic atherosclerosis ([id number]-i70.0). Electronically signed by: Dorethia Molt MD 10/15/2024 10:54 PM EST RP Workstation:  [id number]   CT Head Wo Contrast Result Date: 10/15/2024 EXAM: CT HEAD WITHOUT CONTRAST 10/15/2024 10:40:17 PM TECHNIQUE: CT of the head was performed without the administration of intravenous contrast. Automated exposure control, iterative reconstruction, and/or weight based adjustment of the mA/kV was utilized to reduce the radiation dose to as low as reasonably achievable. COMPARISON: None available. CLINICAL HISTORY: Mental status change, unknown cause FINDINGS: BRAIN AND VENTRICLES: No acute hemorrhage. No evidence of acute infarct. No hydrocephalus. No extra-axial collection. No mass effect or midline shift. Moderate chronic microvascular ischemic change. 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 ORBITS: No acute abnormality. Bilateral lens replacement noted. SINUSES: Polypoid mucosal thickening in bilateral maxillary sinuses. SOFT TISSUES AND SKULL: No acute soft tissue abnormality. No skull fracture. Atherosclerotic calcifications are present within the  cavernous internal carotid arteries. IMPRESSION: 1. No acute intracranial abnormality. Electronically signed by: Morgane Naveau MD 10/15/2024 10:48 PM EST RP Workstation: [id number]   DG Chest Portable 1 View Result Date: 10/15/2024 CLINICAL DATA:  Hypoxia, respiratory distress EXAM: PORTABLE CHEST 1 VIEW COMPARISON:  08/15/2024 FINDINGS: Single frontal view of the chest demonstrates stable multi lead pacemaker. Cardiac silhouette is enlarged but stable. Chronic right upper lobe scarring. Patchy left lower lobe consolidation and small left effusion. No pneumothorax. No acute bony abnormalities. IMPRESSION: 1. Left lower lobe airspace disease which may reflect infection or aspiration. 2. Small left effusion. Electronically Signed   By: Ozell Daring M.D.   On: 10/15/2024 20:02   CUP PACEART INCLINIC DEVICE CHECK Result Date: 10/03/2024 Normal in-clinic CRT-P check. Presenting Rhythm: _AS/AP - VP__ . Routine testing was performed. Thresholds, sensing,  impedance trend and stable and no programming changes were required. HF diagnostics are elevated, but improving. Very frequent PACs during interrogation, short AFib episodes. AF burden 3%. BiV pacing _98_ % of the time. Estimated longevity _2.9 years___ . Pt enrolled in remote follow-up. CANDIE Needle, NP     Discharge Exam: Vitals:   10/23/24 0732 10/23/24 1157  BP: 118/66 128/84  Pulse: 70 68  Resp: 14 18  Temp: 97.8 F (36.6 C) 98 F (36.7 C)  SpO2: 100% 98%   Vitals:   10/23/24 0433 10/23/24 0500 10/23/24 0732 10/23/24 1157  BP: 121/71  118/66 128/84  Pulse: 69  70 68  Resp: 16  14 18   Temp: (!) 97.4 F (36.3 C)  97.8 F (36.6 C) 98 F (36.7 C)  TempSrc:      SpO2: 99%  100% 98%  Weight:  105.5 kg    Height:        Constitutional:  Normal appearance. Non toxic-appearing.  HENT: Head Normocephalic and atraumatic.  Mucous membranes are moist.  Eyes:  Extraocular intact. Conjunctivae normal.  Cardiovascular: Rate and Rhythm: Normal rate and regular rhythm.  Pulmonary: Non labored, symmetric rise of chest wall.  Skin: warm and dry. not jaundiced.  Neurological: alert. Oriented.  Psychiatric: Mood and Affect congruent.    The results of significant diagnostics from this hospitalization (including imaging, microbiology, ancillary and laboratory) are listed below for reference.     Microbiology: Recent Results (from the past 240 hours)  Resp panel by RT-PCR (RSV, Flu A&B, Covid) Anterior Nasal Swab     Status: None   Collection Time: 10/15/24  7:35 PM   Specimen: Anterior Nasal Swab  Result Value Ref Range Status   SARS Coronavirus 2 by RT PCR NEGATIVE NEGATIVE Final    Comment: (NOTE) SARS-CoV-2 target nucleic acids are NOT DETECTED.  The SARS-CoV-2 RNA is generally detectable in upper respiratory specimens during the acute phase of infection. The lowest concentration of SARS-CoV-2 viral copies this assay can detect is 138 copies/mL. A negative result does not  preclude SARS-Cov-2 infection and should not be used as the sole basis for treatment or other patient management decisions. A negative result may occur with  improper specimen collection/handling, submission of specimen other than nasopharyngeal swab, presence of viral mutation(s) within the areas targeted by this assay, and inadequate number of viral copies(<138 copies/mL). A negative result must be combined with clinical observations, patient history, and epidemiological information. The expected result is Negative.  Fact Sheet for Patients:  bloggercourse.com  Fact Sheet for Healthcare Providers:  seriousbroker.it  This test is no t yet approved or cleared by the [place]  FDA and  has been authorized for detection and/or diagnosis of SARS-CoV-2 by FDA under an Emergency Use Authorization (EUA). This EUA will remain  in effect (meaning this test can be used) for the duration of the COVID-19 declaration under Section 564(b)(1) of the Act, 21 U.S.C.section 360bbb-3(b)(1), unless the authorization is terminated  or revoked sooner.       Influenza A by PCR NEGATIVE NEGATIVE Final   Influenza B by PCR NEGATIVE NEGATIVE Final    Comment: (NOTE) The Xpert Xpress SARS-CoV-2/FLU/RSV plus assay is intended as an aid in the diagnosis of influenza from Nasopharyngeal swab specimens and should not be used as a sole basis for treatment. Nasal washings and aspirates are unacceptable for Xpert Xpress SARS-CoV-2/FLU/RSV testing.  Fact Sheet for Patients: bloggercourse.com  Fact Sheet for Healthcare Providers: seriousbroker.it  This test is not yet approved or cleared by the [place]  FDA and has been authorized for detection and/or diagnosis of SARS-CoV-2 by FDA under an Emergency Use Authorization (EUA). This EUA will remain in effect (meaning this test can be used) for the  duration of the COVID-19 declaration under Section 564(b)(1) of the Act, 21 U.S.C. section 360bbb-3(b)(1), unless the authorization is terminated or revoked.     Resp Syncytial Virus by PCR NEGATIVE NEGATIVE Final    Comment: (NOTE) Fact Sheet for Patients: bloggercourse.com  Fact Sheet for Healthcare Providers: seriousbroker.it  This test is not yet approved or cleared by the [place]  FDA and has been authorized for detection and/or diagnosis of SARS-CoV-2 by FDA under an Emergency Use Authorization (EUA). This EUA will remain in effect (meaning this test can be used) for the duration of the COVID-19 declaration under Section 564(b)(1) of the Act, 21 U.S.C. section 360bbb-3(b)(1), unless the authorization is terminated or revoked.  Performed at [facility], [address], [place], [place] [address]   Blood Culture (routine x 2)     Status: Abnormal   Collection Time: 10/15/24  7:41 PM   Specimen: BLOOD  Result Value Ref Range Status   Specimen Description   Final    BLOOD BLOOD LEFT FOREARM Performed at [facility], [address] [place], [place] [address]    Special Requests   Final    BOTTLES DRAWN AEROBIC AND ANAEROBIC Blood Culture results may not be optimal due to an inadequate volume of blood received in culture bottles Performed at [facility], [address] [place], [place] [address]    Culture  Setup Time   Final    GRAM POSITIVE COCCI ANAEROBIC BOTTLE ONLY CRITICAL RESULT CALLED TO, READ BACK BY AND VERIFIED WITH:  CATHALEEN PATEL AT 9664 10/18/24 JG    Culture (A)  Final    STAPHYLOCOCCUS CAPITIS THE SIGNIFICANCE OF ISOLATING THIS ORGANISM FROM A SINGLE SET OF BLOOD CULTURES WHEN MULTIPLE SETS ARE DRAWN IS UNCERTAIN. PLEASE NOTIFY THE MICROBIOLOGY DEPARTMENT WITHIN ONE WEEK IF SPECIATION AND SENSITIVITIES ARE REQUIRED. Performed at [facility], [address], [place], [place] [address]    Report Status 10/20/2024 FINAL  Final  Blood Culture (routine x 2)     Status: None (Preliminary result)   Collection Time: 10/15/24  7:41 PM   Specimen: BLOOD  Result Value Ref Range Status   Specimen Description   Final    BLOOD BLOOD RIGHT FOREARM Performed at [facility], [address], [place], [place] [address]    Special Requests   Final    BOTTLES DRAWN AEROBIC AND ANAEROBIC Blood Culture  results may not be optimal due to an inadequate volume of blood received in culture bottles Performed at [facility], [address] [place], [place] [address]    Culture  Setup Time   Final    GRAM NEGATIVE RODS AEROBIC BOTTLE ONLY Organism ID to follow CRITICAL RESULT CALLED TO, READ BACK BY AND VERIFIED WITH: WILL LENON 10/17/24 0814 MW Performed at [facility], [address] [place], [place] [address]    Culture   Final    VONNE NEGATIVE RODS UNABLE TO FURTHER ID BY ROUTINE METHODS. CALL IF REFERENCE ID REQUIRED Performed at [facility], [address], [place], [place] [address]    Report Status PENDING  Incomplete  Blood Culture ID Panel (Reflexed)     Status: None   Collection Time: 10/15/24  7:41 PM  Result Value Ref Range Status   Enterococcus faecalis NOT DETECTED NOT DETECTED Final   Enterococcus Faecium NOT DETECTED NOT DETECTED Final   Listeria monocytogenes NOT DETECTED NOT DETECTED Final   Staphylococcus species NOT DETECTED NOT DETECTED Final   Staphylococcus aureus (BCID) NOT DETECTED NOT DETECTED Final   Staphylococcus epidermidis NOT DETECTED NOT DETECTED Final   Staphylococcus lugdunensis NOT DETECTED NOT DETECTED Final   Streptococcus species NOT DETECTED NOT DETECTED Final   Streptococcus agalactiae NOT DETECTED NOT DETECTED Final   Streptococcus pneumoniae NOT DETECTED NOT DETECTED Final   Streptococcus pyogenes NOT DETECTED NOT DETECTED Final   A.calcoaceticus-baumannii NOT DETECTED  NOT DETECTED Final   Bacteroides fragilis NOT DETECTED NOT DETECTED Final   Enterobacterales NOT DETECTED NOT DETECTED Final   Enterobacter cloacae complex NOT DETECTED NOT DETECTED Final   Escherichia coli NOT DETECTED NOT DETECTED Final   Klebsiella aerogenes NOT DETECTED NOT DETECTED Final   Klebsiella oxytoca NOT DETECTED NOT DETECTED Final   Klebsiella pneumoniae NOT DETECTED NOT DETECTED Final   Proteus species NOT DETECTED NOT DETECTED Final   Salmonella species NOT DETECTED NOT DETECTED Final   Serratia marcescens NOT DETECTED NOT DETECTED Final   Haemophilus influenzae NOT DETECTED NOT DETECTED Final   Neisseria meningitidis NOT DETECTED NOT DETECTED Final   Pseudomonas aeruginosa NOT DETECTED NOT DETECTED Final   Stenotrophomonas maltophilia NOT DETECTED NOT DETECTED Final   Candida albicans NOT DETECTED NOT DETECTED Final   Candida auris NOT DETECTED NOT DETECTED Final   Candida glabrata NOT DETECTED NOT DETECTED Final   Candida krusei NOT DETECTED NOT DETECTED Final   Candida parapsilosis NOT DETECTED NOT DETECTED Final   Candida tropicalis NOT DETECTED NOT DETECTED Final   Cryptococcus neoformans/gattii NOT DETECTED NOT DETECTED Final    Comment: Performed at [facility], [address] [place], [place] [address]  Blood Culture ID Panel (Reflexed)     Status: Abnormal   Collection Time: 10/15/24  7:41 PM  Result Value Ref Range Status   Enterococcus faecalis NOT DETECTED NOT DETECTED Final   Enterococcus Faecium NOT DETECTED NOT DETECTED Final   Listeria monocytogenes NOT DETECTED NOT DETECTED Final   Staphylococcus species DETECTED (A) NOT DETECTED Final    Comment: CRITICAL RESULT CALLED TO, READ BACK BY AND VERIFIED WITH:  CATHALEEN PATEL AT 0335 10/18/24 JG    Staphylococcus aureus (BCID) NOT DETECTED NOT DETECTED Final   Staphylococcus epidermidis NOT DETECTED NOT DETECTED Final   Staphylococcus lugdunensis NOT DETECTED NOT DETECTED Final   Streptococcus  species NOT DETECTED NOT DETECTED Final   Streptococcus agalactiae NOT DETECTED NOT DETECTED Final   Streptococcus pneumoniae NOT  DETECTED NOT DETECTED Final   Streptococcus pyogenes NOT DETECTED NOT DETECTED Final   A.calcoaceticus-baumannii NOT DETECTED NOT DETECTED Final   Bacteroides fragilis NOT DETECTED NOT DETECTED Final   Enterobacterales NOT DETECTED NOT DETECTED Final   Enterobacter cloacae complex NOT DETECTED NOT DETECTED Final   Escherichia coli NOT DETECTED NOT DETECTED Final   Klebsiella aerogenes NOT DETECTED NOT DETECTED Final   Klebsiella oxytoca NOT DETECTED NOT DETECTED Final   Klebsiella pneumoniae NOT DETECTED NOT DETECTED Final   Proteus species NOT DETECTED NOT DETECTED Final   Salmonella species NOT DETECTED NOT DETECTED Final   Serratia marcescens NOT DETECTED NOT DETECTED Final   Haemophilus influenzae NOT DETECTED NOT DETECTED Final   Neisseria meningitidis NOT DETECTED NOT DETECTED Final   Pseudomonas aeruginosa NOT DETECTED NOT DETECTED Final   Stenotrophomonas maltophilia NOT DETECTED NOT DETECTED Final   Candida albicans NOT DETECTED NOT DETECTED Final   Candida auris NOT DETECTED NOT DETECTED Final   Candida glabrata NOT DETECTED NOT DETECTED Final   Candida krusei NOT DETECTED NOT DETECTED Final   Candida parapsilosis NOT DETECTED NOT DETECTED Final   Candida tropicalis NOT DETECTED NOT DETECTED Final   Cryptococcus neoformans/gattii NOT DETECTED NOT DETECTED Final    Comment: Performed at [facility], [address] [place], [place] [address]  Urine Culture     Status: Abnormal   Collection Time: 10/15/24  9:29 PM   Specimen: Urine, Random  Result Value Ref Range Status   Specimen Description   Final    URINE, RANDOM Performed at [facility], [address] [place], [place] [address]    Special Requests   Final    NONE Reflexed from [id number] Performed at [facility], [address] [place], [place] [address]     Culture >=100,000 COLONIES/mL ESCHERICHIA COLI (A)  Final   Report Status 10/18/2024 FINAL  Final   Organism ID, Bacteria ESCHERICHIA COLI (A)  Final      Susceptibility   Escherichia coli - MIC*    AMPICILLIN  >=32 RESISTANT Resistant     CEFAZOLIN  (URINE) Value in next row Sensitive      8 SENSITIVEThis is a modified FDA-approved test that has been validated and its performance characteristics determined by the reporting laboratory.  This laboratory is certified under the Clinical Laboratory Improvement Amendments CLIA as qualified to perform high complexity clinical laboratory testing.    CEFEPIME  Value in next row Sensitive      8 SENSITIVEThis is a modified FDA-approved test that has been validated and its performance characteristics determined by the reporting laboratory.  This laboratory is certified under the Clinical Laboratory Improvement Amendments CLIA as qualified to perform high complexity clinical laboratory testing.    ERTAPENEM Value in next row Sensitive      8 SENSITIVEThis is a modified FDA-approved test that has been validated and its performance characteristics determined by the reporting laboratory.  This laboratory is certified under the Clinical Laboratory Improvement Amendments CLIA as qualified to perform high complexity clinical laboratory testing.    CEFTRIAXONE  Value in next row Sensitive      8 SENSITIVEThis is a modified FDA-approved test that has been validated and its performance characteristics determined by the reporting laboratory.  This laboratory is certified under the Clinical Laboratory Improvement Amendments CLIA as qualified to perform high complexity clinical laboratory testing.    CIPROFLOXACIN  Value in next row Resistant      8 SENSITIVEThis is a modified FDA-approved test that has been  validated and its performance characteristics determined by the reporting laboratory.  This laboratory is certified under the Clinical Laboratory Improvement Amendments  CLIA as qualified to perform high complexity clinical laboratory testing.    GENTAMICIN  Value in next row Sensitive      8 SENSITIVEThis is a modified FDA-approved test that has been validated and its performance characteristics determined by the reporting laboratory.  This laboratory is certified under the Clinical Laboratory Improvement Amendments CLIA as qualified to perform high complexity clinical laboratory testing.    NITROFURANTOIN Value in next row Sensitive      8 SENSITIVEThis is a modified FDA-approved test that has been validated and its performance characteristics determined by the reporting laboratory.  This laboratory is certified under the Clinical Laboratory Improvement Amendments CLIA as qualified to perform high complexity clinical laboratory testing.    TRIMETH/SULFA Value in next row Resistant      8 SENSITIVEThis is a modified FDA-approved test that has been validated and its performance characteristics determined by the reporting laboratory.  This laboratory is certified under the Clinical Laboratory Improvement Amendments CLIA as qualified to perform high complexity clinical laboratory testing.    AMPICILLIN /SULBACTAM Value in next row Resistant      8 SENSITIVEThis is a modified FDA-approved test that has been validated and its performance characteristics determined by the reporting laboratory.  This laboratory is certified under the Clinical Laboratory Improvement Amendments CLIA as qualified to perform high complexity clinical laboratory testing.    PIP/TAZO Value in next row Sensitive      <=4 SENSITIVEThis is a modified FDA-approved test that has been validated and its performance characteristics determined by the reporting laboratory.  This laboratory is certified under the Clinical Laboratory Improvement Amendments CLIA as qualified to perform high complexity clinical laboratory testing.    MEROPENEM Value in next row Sensitive      <=4 SENSITIVEThis is a modified  FDA-approved test that has been validated and its performance characteristics determined by the reporting laboratory.  This laboratory is certified under the Clinical Laboratory Improvement Amendments CLIA as qualified to perform high complexity clinical laboratory testing.    * >=100,000 COLONIES/mL ESCHERICHIA COLI  Culture, blood (Routine X 2) w Reflex to ID Panel     Status: None   Collection Time: 10/18/24 10:33 AM   Specimen: BLOOD  Result Value Ref Range Status   Specimen Description BLOOD BLOOD RIGHT HAND  Final   Special Requests   Final    BOTTLES DRAWN AEROBIC AND ANAEROBIC Blood Culture adequate volume   Culture   Final    NO GROWTH 5 DAYS Performed at [facility], [address], [place], [place] [address]    Report Status 10/23/2024 FINAL  Final  Culture, blood (Routine X 2) w Reflex to ID Panel     Status: None   Collection Time: 10/18/24 10:33 AM   Specimen: BLOOD  Result Value Ref Range Status   Specimen Description BLOOD BLOOD LEFT HAND  Final   Special Requests   Final    BOTTLES DRAWN AEROBIC AND ANAEROBIC Blood Culture adequate volume   Culture   Final    NO GROWTH 5 DAYS Performed at [facility], [address] [place], [place] [address]    Report Status 10/23/2024 FINAL  Final     Labs: BNP (last 3 results) Recent Labs    05/18/24 1552 07/06/24 1512 08/15/24 2127  BNP 53.2 >4,500.0* >4,500.0*   Basic Metabolic Panel: Recent Labs  Lab 10/17/24  0425 10/18/24 1033 10/19/24 0431 10/20/24 0134 10/23/24 0427  NA 143 139 140 138 137  K 3.5 4.1 4.3 4.0 4.7  CL 102 104 100 102 98  CO2 28 25 29 27 29   GLUCOSE 111* 141* 155* 145* 202*  BUN 69* 66* 66* 68* 73*  CREATININE 2.72* 2.67* 2.61* 2.70* 2.87*  CALCIUM  7.3* 7.2* 7.7* 7.3* 7.8*  PHOS  --   --  3.8  --  4.0   Liver Function Tests: Recent Labs  Lab 10/19/24 0431 10/23/24 0427  ALBUMIN  3.2* 2.9*   No results for input(s): LIPASE, AMYLASE in the last 168 hours. No  results for input(s): AMMONIA in the last 168 hours. CBC: Recent Labs  Lab 10/17/24 0425 10/18/24 1033 10/20/24 0134  WBC 4.8 4.6 6.1  NEUTROABS 3.0  --   --   HGB 13.6 12.5* 13.1  HCT 42.9 39.5 40.8  MCV 89.0 88.0 87.2  PLT 113* 115* 124*   Cardiac Enzymes: No results for input(s): CKTOTAL, CKMB, CKMBINDEX, TROPONINI in the last 168 hours. BNP: Invalid input(s): POCBNP CBG: Recent Labs  Lab 10/22/24 0815 10/22/24 1221 10/22/24 1552 10/23/24 0734 10/23/24 1159  GLUCAP 135* 145* 188* 187* 183*   D-Dimer No results for input(s): DDIMER in the last 72 hours. Hgb A1c No results for input(s): HGBA1C in the last 72 hours. Lipid Profile No results for input(s): CHOL, HDL, LDLCALC, TRIG, CHOLHDL, LDLDIRECT in the last 72 hours. Thyroid function studies No results for input(s): TSH, T4TOTAL, T3FREE, THYROIDAB in the last 72 hours.  Invalid input(s): FREET3 Anemia work up No results for input(s): VITAMINB12, FOLATE, FERRITIN, TIBC, IRON, RETICCTPCT in the last 72 hours. Urinalysis    Component Value Date/Time   COLORURINE AMBER (A) 10/15/2024 2129   APPEARANCEUR CLOUDY (A) 10/15/2024 2129   LABSPEC 1.019 10/15/2024 2129   PHURINE 5.0 10/15/2024 2129   GLUCOSEU NEGATIVE 10/15/2024 2129   HGBUR MODERATE (A) 10/15/2024 2129   BILIRUBINUR NEGATIVE 10/15/2024 2129   KETONESUR NEGATIVE 10/15/2024 2129   PROTEINUR 100 (A) 10/15/2024 2129   NITRITE NEGATIVE 10/15/2024 2129   LEUKOCYTESUR LARGE (A) 10/15/2024 2129   Sepsis Labs Recent Labs  Lab 10/17/24 0425 10/18/24 1033 10/20/24 0134  WBC 4.8 4.6 6.1   Microbiology Recent Results (from the past 240 hours)  Resp panel by RT-PCR (RSV, Flu A&B, Covid) Anterior Nasal Swab     Status: None   Collection Time: 10/15/24  7:35 PM   Specimen: Anterior Nasal Swab  Result Value Ref Range Status   SARS Coronavirus 2 by RT PCR NEGATIVE NEGATIVE Final    Comment:  (NOTE) SARS-CoV-2 target nucleic acids are NOT DETECTED.  The SARS-CoV-2 RNA is generally detectable in upper respiratory specimens during the acute phase of infection. The lowest concentration of SARS-CoV-2 viral copies this assay can detect is 138 copies/mL. A negative result does not preclude SARS-Cov-2 infection and should not be used as the sole basis for treatment or other patient management decisions. A negative result may occur with  improper specimen collection/handling, submission of specimen other than nasopharyngeal swab, presence of viral mutation(s) within the areas targeted by this assay, and inadequate number of viral copies(<138 copies/mL). A negative result must be combined with clinical observations, patient history, and epidemiological information. The expected result is Negative.  Fact Sheet for Patients:  bloggercourse.com  Fact Sheet for Healthcare Providers:  seriousbroker.it  This test is no t yet approved or cleared by the [place]  FDA and  has been authorized  for detection and/or diagnosis of SARS-CoV-2 by FDA under an Emergency Use Authorization (EUA). This EUA will remain  in effect (meaning this test can be used) for the duration of the COVID-19 declaration under Section 564(b)(1) of the Act, 21 U.S.C.section 360bbb-3(b)(1), unless the authorization is terminated  or revoked sooner.       Influenza A by PCR NEGATIVE NEGATIVE Final   Influenza B by PCR NEGATIVE NEGATIVE Final    Comment: (NOTE) The Xpert Xpress SARS-CoV-2/FLU/RSV plus assay is intended as an aid in the diagnosis of influenza from Nasopharyngeal swab specimens and should not be used as a sole basis for treatment. Nasal washings and aspirates are unacceptable for Xpert Xpress SARS-CoV-2/FLU/RSV testing.  Fact Sheet for Patients: bloggercourse.com  Fact Sheet for Healthcare  Providers: seriousbroker.it  This test is not yet approved or cleared by the [place]  FDA and has been authorized for detection and/or diagnosis of SARS-CoV-2 by FDA under an Emergency Use Authorization (EUA). This EUA will remain in effect (meaning this test can be used) for the duration of the COVID-19 declaration under Section 564(b)(1) of the Act, 21 U.S.C. section 360bbb-3(b)(1), unless the authorization is terminated or revoked.     Resp Syncytial Virus by PCR NEGATIVE NEGATIVE Final    Comment: (NOTE) Fact Sheet for Patients: bloggercourse.com  Fact Sheet for Healthcare Providers: seriousbroker.it  This test is not yet approved or cleared by the [place]  FDA and has been authorized for detection and/or diagnosis of SARS-CoV-2 by FDA under an Emergency Use Authorization (EUA). This EUA will remain in effect (meaning this test can be used) for the duration of the COVID-19 declaration under Section 564(b)(1) of the Act, 21 U.S.C. section 360bbb-3(b)(1), unless the authorization is terminated or revoked.  Performed at [facility], [address] [place], [place] [address]   Blood Culture (routine x 2)     Status: Abnormal   Collection Time: 10/15/24  7:41 PM   Specimen: BLOOD  Result Value Ref Range Status   Specimen Description   Final    BLOOD BLOOD LEFT FOREARM Performed at [facility], [address] [place], [place] [address]    Special Requests   Final    BOTTLES DRAWN AEROBIC AND ANAEROBIC Blood Culture results may not be optimal due to an inadequate volume of blood received in culture bottles Performed at [facility], [address] [place], [place] [address]    Culture  Setup Time   Final    GRAM POSITIVE COCCI ANAEROBIC BOTTLE ONLY CRITICAL RESULT CALLED TO, READ BACK BY AND VERIFIED WITH:  CATHALEEN PATEL AT 9664 10/18/24 JG    Culture (A)   Final    STAPHYLOCOCCUS CAPITIS THE SIGNIFICANCE OF ISOLATING THIS ORGANISM FROM A SINGLE SET OF BLOOD CULTURES WHEN MULTIPLE SETS ARE DRAWN IS UNCERTAIN. PLEASE NOTIFY THE MICROBIOLOGY DEPARTMENT WITHIN ONE WEEK IF SPECIATION AND SENSITIVITIES ARE REQUIRED. Performed at [facility], [address] [place], [place] [address]    Report Status 10/20/2024 FINAL  Final  Blood Culture (routine x 2)     Status: None (Preliminary result)   Collection Time: 10/15/24  7:41 PM   Specimen: BLOOD  Result Value Ref Range Status   Specimen Description   Final    BLOOD BLOOD RIGHT FOREARM Performed at [facility], [address] [place], [place] [address]    Special Requests   Final    BOTTLES DRAWN AEROBIC AND ANAEROBIC Blood Culture results may not  be optimal due to an inadequate volume of blood received in culture bottles Performed at [facility], [address] [place], [place] [address]    Culture  Setup Time   Final    GRAM NEGATIVE RODS AEROBIC BOTTLE ONLY Organism ID to follow CRITICAL RESULT CALLED TO, READ BACK BY AND VERIFIED WITH: WILL LENON 10/17/24 0814 MW Performed at [facility], [address] [place], [place] [address]    Culture   Final    VONNE NEGATIVE RODS UNABLE TO FURTHER ID BY ROUTINE METHODS. CALL IF REFERENCE ID REQUIRED Performed at [facility], [address], [place], [place] [address]    Report Status PENDING  Incomplete  Blood Culture ID Panel (Reflexed)     Status: None   Collection Time: 10/15/24  7:41 PM  Result Value Ref Range Status   Enterococcus faecalis NOT DETECTED NOT DETECTED Final   Enterococcus Faecium NOT DETECTED NOT DETECTED Final   Listeria monocytogenes NOT DETECTED NOT DETECTED Final   Staphylococcus species NOT DETECTED NOT DETECTED Final   Staphylococcus aureus (BCID) NOT DETECTED NOT DETECTED Final   Staphylococcus epidermidis NOT DETECTED NOT DETECTED Final   Staphylococcus  lugdunensis NOT DETECTED NOT DETECTED Final   Streptococcus species NOT DETECTED NOT DETECTED Final   Streptococcus agalactiae NOT DETECTED NOT DETECTED Final   Streptococcus pneumoniae NOT DETECTED NOT DETECTED Final   Streptococcus pyogenes NOT DETECTED NOT DETECTED Final   A.calcoaceticus-baumannii NOT DETECTED NOT DETECTED Final   Bacteroides fragilis NOT DETECTED NOT DETECTED Final   Enterobacterales NOT DETECTED NOT DETECTED Final   Enterobacter cloacae complex NOT DETECTED NOT DETECTED Final   Escherichia coli NOT DETECTED NOT DETECTED Final   Klebsiella aerogenes NOT DETECTED NOT DETECTED Final   Klebsiella oxytoca NOT DETECTED NOT DETECTED Final   Klebsiella pneumoniae NOT DETECTED NOT DETECTED Final   Proteus species NOT DETECTED NOT DETECTED Final   Salmonella species NOT DETECTED NOT DETECTED Final   Serratia marcescens NOT DETECTED NOT DETECTED Final   Haemophilus influenzae NOT DETECTED NOT DETECTED Final   Neisseria meningitidis NOT DETECTED NOT DETECTED Final   Pseudomonas aeruginosa NOT DETECTED NOT DETECTED Final   Stenotrophomonas maltophilia NOT DETECTED NOT DETECTED Final   Candida albicans NOT DETECTED NOT DETECTED Final   Candida auris NOT DETECTED NOT DETECTED Final   Candida glabrata NOT DETECTED NOT DETECTED Final   Candida krusei NOT DETECTED NOT DETECTED Final   Candida parapsilosis NOT DETECTED NOT DETECTED Final   Candida tropicalis NOT DETECTED NOT DETECTED Final   Cryptococcus neoformans/gattii NOT DETECTED NOT DETECTED Final    Comment: Performed at [facility], [address] [place], [place] [address]  Blood Culture ID Panel (Reflexed)     Status: Abnormal   Collection Time: 10/15/24  7:41 PM  Result Value Ref Range Status   Enterococcus faecalis NOT DETECTED NOT DETECTED Final   Enterococcus Faecium NOT DETECTED NOT DETECTED Final   Listeria monocytogenes NOT DETECTED NOT DETECTED Final   Staphylococcus species DETECTED (A) NOT  DETECTED Final    Comment: CRITICAL RESULT CALLED TO, READ BACK BY AND VERIFIED WITH:  CATHALEEN PATEL AT 0335 10/18/24 JG    Staphylococcus aureus (BCID) NOT DETECTED NOT DETECTED Final   Staphylococcus epidermidis NOT DETECTED NOT DETECTED Final   Staphylococcus lugdunensis NOT DETECTED NOT DETECTED Final   Streptococcus species NOT DETECTED NOT DETECTED Final   Streptococcus agalactiae NOT DETECTED NOT DETECTED Final   Streptococcus pneumoniae NOT DETECTED NOT DETECTED  Final   Streptococcus pyogenes NOT DETECTED NOT DETECTED Final   A.calcoaceticus-baumannii NOT DETECTED NOT DETECTED Final   Bacteroides fragilis NOT DETECTED NOT DETECTED Final   Enterobacterales NOT DETECTED NOT DETECTED Final   Enterobacter cloacae complex NOT DETECTED NOT DETECTED Final   Escherichia coli NOT DETECTED NOT DETECTED Final   Klebsiella aerogenes NOT DETECTED NOT DETECTED Final   Klebsiella oxytoca NOT DETECTED NOT DETECTED Final   Klebsiella pneumoniae NOT DETECTED NOT DETECTED Final   Proteus species NOT DETECTED NOT DETECTED Final   Salmonella species NOT DETECTED NOT DETECTED Final   Serratia marcescens NOT DETECTED NOT DETECTED Final   Haemophilus influenzae NOT DETECTED NOT DETECTED Final   Neisseria meningitidis NOT DETECTED NOT DETECTED Final   Pseudomonas aeruginosa NOT DETECTED NOT DETECTED Final   Stenotrophomonas maltophilia NOT DETECTED NOT DETECTED Final   Candida albicans NOT DETECTED NOT DETECTED Final   Candida auris NOT DETECTED NOT DETECTED Final   Candida glabrata NOT DETECTED NOT DETECTED Final   Candida krusei NOT DETECTED NOT DETECTED Final   Candida parapsilosis NOT DETECTED NOT DETECTED Final   Candida tropicalis NOT DETECTED NOT DETECTED Final   Cryptococcus neoformans/gattii NOT DETECTED NOT DETECTED Final    Comment: Performed at [facility], [address], [place], [place] [address]  Urine Culture     Status: Abnormal   Collection Time: 10/15/24  9:29 PM    Specimen: Urine, Random  Result Value Ref Range Status   Specimen Description   Final    URINE, RANDOM Performed at [facility], [address] [place], [place] [address]    Special Requests   Final    NONE Reflexed from [id number] Performed at [facility], [address] [place], [place] [address]    Culture >=100,000 COLONIES/mL ESCHERICHIA COLI (A)  Final   Report Status 10/18/2024 FINAL  Final   Organism ID, Bacteria ESCHERICHIA COLI (A)  Final      Susceptibility   Escherichia coli - MIC*    AMPICILLIN  >=32 RESISTANT Resistant     CEFAZOLIN  (URINE) Value in next row Sensitive      8 SENSITIVEThis is a modified FDA-approved test that has been validated and its performance characteristics determined by the reporting laboratory.  This laboratory is certified under the Clinical Laboratory Improvement Amendments CLIA as qualified to perform high complexity clinical laboratory testing.    CEFEPIME  Value in next row Sensitive      8 SENSITIVEThis is a modified FDA-approved test that has been validated and its performance characteristics determined by the reporting laboratory.  This laboratory is certified under the Clinical Laboratory Improvement Amendments CLIA as qualified to perform high complexity clinical laboratory testing.    ERTAPENEM Value in next row Sensitive      8 SENSITIVEThis is a modified FDA-approved test that has been validated and its performance characteristics determined by the reporting laboratory.  This laboratory is certified under the Clinical Laboratory Improvement Amendments CLIA as qualified to perform high complexity clinical laboratory testing.    CEFTRIAXONE  Value in next row Sensitive      8 SENSITIVEThis is a modified FDA-approved test that has been validated and its performance characteristics determined by the reporting laboratory.  This laboratory is certified under the Clinical Laboratory Improvement Amendments CLIA as qualified to perform  high complexity clinical laboratory testing.    CIPROFLOXACIN  Value in next row Resistant      8 SENSITIVEThis is a modified FDA-approved test that has been validated and  its performance characteristics determined by the reporting laboratory.  This laboratory is certified under the Clinical Laboratory Improvement Amendments CLIA as qualified to perform high complexity clinical laboratory testing.    GENTAMICIN  Value in next row Sensitive      8 SENSITIVEThis is a modified FDA-approved test that has been validated and its performance characteristics determined by the reporting laboratory.  This laboratory is certified under the Clinical Laboratory Improvement Amendments CLIA as qualified to perform high complexity clinical laboratory testing.    NITROFURANTOIN Value in next row Sensitive      8 SENSITIVEThis is a modified FDA-approved test that has been validated and its performance characteristics determined by the reporting laboratory.  This laboratory is certified under the Clinical Laboratory Improvement Amendments CLIA as qualified to perform high complexity clinical laboratory testing.    TRIMETH/SULFA Value in next row Resistant      8 SENSITIVEThis is a modified FDA-approved test that has been validated and its performance characteristics determined by the reporting laboratory.  This laboratory is certified under the Clinical Laboratory Improvement Amendments CLIA as qualified to perform high complexity clinical laboratory testing.    AMPICILLIN /SULBACTAM Value in next row Resistant      8 SENSITIVEThis is a modified FDA-approved test that has been validated and its performance characteristics determined by the reporting laboratory.  This laboratory is certified under the Clinical Laboratory Improvement Amendments CLIA as qualified to perform high complexity clinical laboratory testing.    PIP/TAZO Value in next row Sensitive      <=4 SENSITIVEThis is a modified FDA-approved test that has been  validated and its performance characteristics determined by the reporting laboratory.  This laboratory is certified under the Clinical Laboratory Improvement Amendments CLIA as qualified to perform high complexity clinical laboratory testing.    MEROPENEM Value in next row Sensitive      <=4 SENSITIVEThis is a modified FDA-approved test that has been validated and its performance characteristics determined by the reporting laboratory.  This laboratory is certified under the Clinical Laboratory Improvement Amendments CLIA as qualified to perform high complexity clinical laboratory testing.    * >=100,000 COLONIES/mL ESCHERICHIA COLI  Culture, blood (Routine X 2) w Reflex to ID Panel     Status: None   Collection Time: 10/18/24 10:33 AM   Specimen: BLOOD  Result Value Ref Range Status   Specimen Description BLOOD BLOOD RIGHT HAND  Final   Special Requests   Final    BOTTLES DRAWN AEROBIC AND ANAEROBIC Blood Culture adequate volume   Culture   Final    NO GROWTH 5 DAYS Performed at [facility], [address] [place], [place] [address]    Report Status 10/23/2024 FINAL  Final  Culture, blood (Routine X 2) w Reflex to ID Panel     Status: None   Collection Time: 10/18/24 10:33 AM   Specimen: BLOOD  Result Value Ref Range Status   Specimen Description BLOOD BLOOD LEFT HAND  Final   Special Requests   Final    BOTTLES DRAWN AEROBIC AND ANAEROBIC Blood Culture adequate volume   Culture   Final    NO GROWTH 5 DAYS Performed at [facility], [address] [place], [place] [address]    Report Status 10/23/2024 FINAL  Final     Time coordinating discharge: 32 min   SIGNED: Zamier Eggebrecht, DO [facility] 10/23/2024, 1:54 PM Pager   If 7PM-7AM, please contact night-coverage     [1]  Allergies Allergen Reactions  Angiotensin Receptor Blockers     hyperkalemia   Metformin Diarrhea

## 2024-10-23 NOTE — TOC Transition Note (Signed)
 Transition of Care [facility]) - Discharge Note   Patient Details  Name: [name] MRN: [id number] Date of Birth: [birth year]  [facility]) CM/SW Contact:  [name], RN Phone Number: 10/23/2024, 2:26 PM   Clinical Narrative:    Patient selected HH services with [facility]. Notified representative. DME ordered from [employer], confirmed with rep it will be delivered to the home.   Contacted patient daughter with update on DME and discharge. She asked if the MD had ordered 24 hour care. Advised that 24 hour care would need to be secured by the family as a private pay service as it it not included with insurance.  No additional TOC needs.    RNCM signing off.   Final next level of care: Home w Home Health Services Barriers to Discharge: Barriers Resolved   Patient Goals and CMS Choice Patient states their goals for this hospitalization and ongoing recovery are:: go home          Discharge Placement                  Name of family member notified: [name] Patient and family notified of of transfer: 10/23/24  Discharge Plan and Services Additional resources added to the After Visit Summary for                  DME Arranged: 3-N-1, Wheelchair manual DME Agency: [employer] Date DME Agency Contacted: 10/23/24 Time DME Agency Contacted: 1418 Representative spoke with at DME Agency: Thomasina HH Arranged: PT, OT HH Agency: [facility] Date [facility] Agency Contacted: 10/23/24 Time HH Agency Contacted: 1419 Representative spoke with at [facility] Agency: Larraine  Social Drivers of Health (SDOH) Interventions SDOH Screenings   Food Insecurity: No Food Insecurity (10/16/2024)  Housing: Low Risk (10/17/2024)  Transportation Needs: No Transportation Needs (10/17/2024)  Utilities: Not At Risk (10/16/2024)  Depression (PHQ2-9): Low Risk (04/07/2022)  Financial Resource Strain: Low Risk (09/21/2024)   Received from [facility]  Social Connections: Moderately Integrated  (10/16/2024)  Tobacco Use: Medium Risk (10/17/2024)     Readmission Risk Interventions    10/17/2024   10:32 AM 03/26/2024    4:09 PM 03/10/2023   12:47 PM  Readmission Risk Prevention Plan  Transportation Screening Complete Complete Complete  PCP or Specialist Appt within 3-5 Days   Complete  Social Work Consult for Recovery Care Planning/Counseling   Complete  Palliative Care Screening   Not Applicable  Medication Review Oceanographer) Complete Complete Complete  PCP or Specialist appointment within 3-5 days of discharge Complete Complete   HRI or Home Care Consult Complete Complete   SW Recovery Care/Counseling Consult Complete Complete   Palliative Care Screening Not Applicable Not Applicable   Skilled Nursing Facility Not Applicable Not Applicable

## 2024-10-23 NOTE — TOC CM/SW Note (Signed)
 Patient is not able to walk the distance required to go the bathroom, or he/she is unable to safely negotiate stairs required to access the bathroom.  A 3in1 BSC will alleviate this problem

## 2024-10-23 NOTE — Plan of Care (Signed)
°                                                   °  Palliative Care Progress Note   Patient Name: [name]       Date: 10/23/2024 DOB: 23-Nov-1934  Age: 89 y.o. MRN#: [id number] Attending Physician: No att. providers found Primary Care Physician: [name], MD Admit Date: 10/15/2024  Extensive chart review completed including labs, vital signs, imaging, progress notes, orders, and available advanced directive documents from current and previous encounters.   Into see patient.  He is out of bed and sitting in his recliner.  He is awake, alert, oriented x 4, able to make his wishes known.  No family or friends present at bedside during my visit.  I attempted to engage in goals of care discussions with patient.  However, he took a phone call.  I advised him I will return this afternoon to discuss with him.  I plan to return this afternoon.  However, discharge summary is in place.  Patient was discharged prior to being able to complete goals of care discussions.  Thank you for allowing the Palliative Medicine Team to assist in the care of [facility].  Lamarr L. Arvid, DNP, FNP-BC Palliative Medicine Team  No charge

## 2024-10-24 LAB — CUP PACEART REMOTE DEVICE CHECK
Battery Remaining Longevity: 38 mo
Battery Remaining Percentage: 51 %
Battery Voltage: 2.98 V
Brady Statistic AP VP Percent: 37 %
Brady Statistic AP VS Percent: 1 %
Brady Statistic AS VP Percent: 61 %
Brady Statistic AS VS Percent: 1 %
Brady Statistic RA Percent Paced: 20 %
Date Time Interrogation Session: 20251216173324
Implantable Lead Connection Status: 753985
Implantable Lead Connection Status: 753985
Implantable Lead Connection Status: 753985
Implantable Lead Implant Date: 20221205
Implantable Lead Implant Date: 20221205
Implantable Lead Implant Date: 20221205
Implantable Lead Location: 753858
Implantable Lead Location: 753859
Implantable Lead Location: 753860
Implantable Lead Model: 3830
Implantable Pulse Generator Implant Date: 20221205
Lead Channel Impedance Value: 410 Ohm
Lead Channel Impedance Value: 460 Ohm
Lead Channel Impedance Value: 510 Ohm
Lead Channel Pacing Threshold Amplitude: 0.5 V
Lead Channel Pacing Threshold Amplitude: 0.75 V
Lead Channel Pacing Threshold Amplitude: 2 V
Lead Channel Pacing Threshold Pulse Width: 0.5 ms
Lead Channel Pacing Threshold Pulse Width: 0.5 ms
Lead Channel Pacing Threshold Pulse Width: 0.5 ms
Lead Channel Sensing Intrinsic Amplitude: 2.4 mV
Lead Channel Sensing Intrinsic Amplitude: 7.3 mV
Lead Channel Setting Pacing Amplitude: 2 V
Lead Channel Setting Pacing Amplitude: 2 V
Lead Channel Setting Pacing Amplitude: 3.5 V
Lead Channel Setting Pacing Pulse Width: 0.5 ms
Lead Channel Setting Pacing Pulse Width: 0.5 ms
Lead Channel Setting Sensing Sensitivity: 2 mV
Pulse Gen Model: 3222
Pulse Gen Serial Number: 3901949

## 2024-10-24 NOTE — Progress Notes (Signed)
 Remote PPM Transmission

## 2024-10-25 NOTE — Progress Notes (Signed)
 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 Based on reviewing articles regarding this organism, it is a rare pathogen but is expected to be susceptible to ceftriaxone  and cefepime  that the patient was given and received a sufficient duration of 7 days with repeat blood cultures reporting as no growth.   Zoye Chandra, PharmD, BCPS, BCIDP Work Cell: [phone] 10/25/2024 1:29 PM

## 2024-10-26 ENCOUNTER — Ambulatory Visit: Payer: Self-pay | Admitting: Cardiology

## 2024-10-26 LAB — MISC LABCORP TEST (SEND OUT)
LabCorp test name: 182261
Labcorp test code: 182261

## 2024-10-26 LAB — CULTURE, BLOOD (ROUTINE X 2): Culture  Setup Time: NO GROWTH

## 2024-11-05 ENCOUNTER — Other Ambulatory Visit: Payer: Self-pay

## 2024-11-06 ENCOUNTER — Inpatient Hospital Stay

## 2024-11-06 ENCOUNTER — Inpatient Hospital Stay: Admitting: Oncology

## 2024-11-12 ENCOUNTER — Telehealth: Payer: Self-pay | Admitting: Family
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
## 2024-11-12 NOTE — Telephone Encounter (Signed)
 Called to confirm/remind patient of their appointment at the Advanced Heart Failure Clinic on 11/13/24.   Appointment:   [x] Confirmed  [] Left mess   [] No answer/No voice mail  [] VM Full/unable to leave message  [] Phone not in service  Patient reminded to bring all medications and/or complete list.  Confirmed patient has transportation. Gave directions, instructed to utilize valet parking.

## 2024-11-13 ENCOUNTER — Telehealth: Payer: Self-pay | Admitting: Family

## 2024-11-13 ENCOUNTER — Ambulatory Visit: Admitting: Family

## 2024-11-13 NOTE — Telephone Encounter (Signed)
 Patient did not show for his Heart Failure Clinic appointment on 11/15/24.

## 2024-11-15 ENCOUNTER — Telehealth: Payer: Self-pay | Admitting: Family
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
## 2024-11-15 NOTE — Telephone Encounter (Signed)
 Called to confirm/remind patient of their appointment at the Advanced Heart Failure Clinic on 11/16/24.   Appointment:   [] Confirmed  [x] Left mess   [] No answer/No voice mail  [] VM Full/unable to leave message  [] Phone not in service  Patient reminded to bring all medications and/or complete list.  Confirmed patient has transportation. Gave directions, instructed to utilize valet parking.

## 2024-11-16 ENCOUNTER — Other Ambulatory Visit: Payer: Self-pay

## 2024-11-16 ENCOUNTER — Emergency Department

## 2024-11-16 ENCOUNTER — Encounter: Payer: Self-pay | Admitting: Family

## 2024-11-16 ENCOUNTER — Ambulatory Visit: Attending: Family | Admitting: Family

## 2024-11-16 ENCOUNTER — Inpatient Hospital Stay
Admission: EM | Admit: 2024-11-16 | Discharge: 2024-11-23 | DRG: 291 | Disposition: A | Discharge status: Home / Self Care | Source: Ambulatory Visit | Attending: Internal Medicine | Admitting: Internal Medicine

## 2024-11-16 VITALS — BP 134/84 | HR 70

## 2024-11-16 DIAGNOSIS — I255 Ischemic cardiomyopathy: Secondary | ICD-10-CM | POA: Diagnosis present

## 2024-11-16 DIAGNOSIS — Z8546 Personal history of malignant neoplasm of prostate: Secondary | ICD-10-CM

## 2024-11-16 DIAGNOSIS — Z6836 Body mass index (BMI) 36.0-36.9, adult: Secondary | ICD-10-CM

## 2024-11-16 DIAGNOSIS — Z8679 Personal history of other diseases of the circulatory system: Secondary | ICD-10-CM | POA: Diagnosis not present

## 2024-11-16 DIAGNOSIS — E1169 Type 2 diabetes mellitus with other specified complication: Secondary | ICD-10-CM | POA: Diagnosis present

## 2024-11-16 DIAGNOSIS — Z8674 Personal history of sudden cardiac arrest: Secondary | ICD-10-CM

## 2024-11-16 DIAGNOSIS — I5023 Acute on chronic systolic (congestive) heart failure: Secondary | ICD-10-CM | POA: Diagnosis present

## 2024-11-16 DIAGNOSIS — I5022 Chronic systolic (congestive) heart failure: Secondary | ICD-10-CM | POA: Diagnosis not present

## 2024-11-16 DIAGNOSIS — J9811 Atelectasis: Secondary | ICD-10-CM | POA: Diagnosis present

## 2024-11-16 DIAGNOSIS — E669 Obesity, unspecified: Secondary | ICD-10-CM | POA: Diagnosis present

## 2024-11-16 DIAGNOSIS — Z7901 Long term (current) use of anticoagulants: Secondary | ICD-10-CM

## 2024-11-16 DIAGNOSIS — Z8616 Personal history of COVID-19: Secondary | ICD-10-CM

## 2024-11-16 DIAGNOSIS — Z7984 Long term (current) use of oral hypoglycemic drugs: Secondary | ICD-10-CM | POA: Insufficient documentation

## 2024-11-16 DIAGNOSIS — I130 Hypertensive heart and chronic kidney disease with heart failure and stage 1 through stage 4 chronic kidney disease, or unspecified chronic kidney disease: Secondary | ICD-10-CM | POA: Insufficient documentation

## 2024-11-16 DIAGNOSIS — I472 Ventricular tachycardia, unspecified: Secondary | ICD-10-CM | POA: Diagnosis present

## 2024-11-16 DIAGNOSIS — G4734 Idiopathic sleep related nonobstructive alveolar hypoventilation: Secondary | ICD-10-CM | POA: Insufficient documentation

## 2024-11-16 DIAGNOSIS — E876 Hypokalemia: Secondary | ICD-10-CM | POA: Diagnosis present

## 2024-11-16 DIAGNOSIS — Z950 Presence of cardiac pacemaker: Secondary | ICD-10-CM

## 2024-11-16 DIAGNOSIS — I442 Atrioventricular block, complete: Secondary | ICD-10-CM | POA: Diagnosis present

## 2024-11-16 DIAGNOSIS — N184 Chronic kidney disease, stage 4 (severe): Secondary | ICD-10-CM | POA: Diagnosis present

## 2024-11-16 DIAGNOSIS — Z79899 Other long term (current) drug therapy: Secondary | ICD-10-CM | POA: Insufficient documentation

## 2024-11-16 DIAGNOSIS — Z9981 Dependence on supplemental oxygen: Secondary | ICD-10-CM | POA: Insufficient documentation

## 2024-11-16 DIAGNOSIS — Z888 Allergy status to other drugs, medicaments and biological substances status: Secondary | ICD-10-CM

## 2024-11-16 DIAGNOSIS — Z87891 Personal history of nicotine dependence: Secondary | ICD-10-CM

## 2024-11-16 DIAGNOSIS — E1122 Type 2 diabetes mellitus with diabetic chronic kidney disease: Secondary | ICD-10-CM | POA: Insufficient documentation

## 2024-11-16 DIAGNOSIS — D631 Anemia in chronic kidney disease: Secondary | ICD-10-CM | POA: Diagnosis present

## 2024-11-16 DIAGNOSIS — D696 Thrombocytopenia, unspecified: Secondary | ICD-10-CM | POA: Diagnosis present

## 2024-11-16 DIAGNOSIS — D72819 Decreased white blood cell count, unspecified: Secondary | ICD-10-CM | POA: Diagnosis present

## 2024-11-16 DIAGNOSIS — I10 Essential (primary) hypertension: Secondary | ICD-10-CM | POA: Diagnosis not present

## 2024-11-16 DIAGNOSIS — N183 Chronic kidney disease, stage 3 unspecified: Secondary | ICD-10-CM | POA: Diagnosis not present

## 2024-11-16 DIAGNOSIS — I480 Paroxysmal atrial fibrillation: Secondary | ICD-10-CM | POA: Diagnosis present

## 2024-11-16 DIAGNOSIS — I4892 Unspecified atrial flutter: Secondary | ICD-10-CM | POA: Diagnosis present

## 2024-11-16 DIAGNOSIS — Z515 Encounter for palliative care: Secondary | ICD-10-CM

## 2024-11-16 DIAGNOSIS — R001 Bradycardia, unspecified: Secondary | ICD-10-CM | POA: Diagnosis not present

## 2024-11-16 DIAGNOSIS — C61 Malignant neoplasm of prostate: Secondary | ICD-10-CM | POA: Diagnosis present

## 2024-11-16 DIAGNOSIS — Z951 Presence of aortocoronary bypass graft: Secondary | ICD-10-CM | POA: Diagnosis not present

## 2024-11-16 DIAGNOSIS — M109 Gout, unspecified: Secondary | ICD-10-CM | POA: Diagnosis present

## 2024-11-16 DIAGNOSIS — Z794 Long term (current) use of insulin: Secondary | ICD-10-CM | POA: Diagnosis not present

## 2024-11-16 DIAGNOSIS — N1832 Chronic kidney disease, stage 3b: Secondary | ICD-10-CM | POA: Diagnosis not present

## 2024-11-16 DIAGNOSIS — K922 Gastrointestinal hemorrhage, unspecified: Secondary | ICD-10-CM | POA: Insufficient documentation

## 2024-11-16 DIAGNOSIS — M7989 Other specified soft tissue disorders: Secondary | ICD-10-CM

## 2024-11-16 DIAGNOSIS — R531 Weakness: Secondary | ICD-10-CM | POA: Diagnosis present

## 2024-11-16 DIAGNOSIS — I509 Heart failure, unspecified: Secondary | ICD-10-CM | POA: Diagnosis not present

## 2024-11-16 DIAGNOSIS — I2489 Other forms of acute ischemic heart disease: Secondary | ICD-10-CM | POA: Diagnosis present

## 2024-11-16 DIAGNOSIS — K219 Gastro-esophageal reflux disease without esophagitis: Secondary | ICD-10-CM | POA: Diagnosis present

## 2024-11-16 DIAGNOSIS — R32 Unspecified urinary incontinence: Secondary | ICD-10-CM | POA: Diagnosis present

## 2024-11-16 DIAGNOSIS — I2510 Atherosclerotic heart disease of native coronary artery without angina pectoris: Secondary | ICD-10-CM | POA: Insufficient documentation

## 2024-11-16 DIAGNOSIS — I252 Old myocardial infarction: Secondary | ICD-10-CM

## 2024-11-16 DIAGNOSIS — E785 Hyperlipidemia, unspecified: Secondary | ICD-10-CM | POA: Diagnosis present

## 2024-11-16 DIAGNOSIS — E7849 Other hyperlipidemia: Secondary | ICD-10-CM | POA: Diagnosis present

## 2024-11-16 DIAGNOSIS — R0902 Hypoxemia: Secondary | ICD-10-CM | POA: Diagnosis present

## 2024-11-16 DIAGNOSIS — Z8249 Family history of ischemic heart disease and other diseases of the circulatory system: Secondary | ICD-10-CM

## 2024-11-16 DIAGNOSIS — M1711 Unilateral primary osteoarthritis, right knee: Secondary | ICD-10-CM | POA: Diagnosis present

## 2024-11-16 DIAGNOSIS — J90 Pleural effusion, not elsewhere classified: Secondary | ICD-10-CM | POA: Insufficient documentation

## 2024-11-16 DIAGNOSIS — I493 Ventricular premature depolarization: Secondary | ICD-10-CM | POA: Diagnosis present

## 2024-11-16 LAB — CBC
HCT: 39.9 % (ref 39.0–52.0)
Hemoglobin: 12.4 g/dL — ABNORMAL LOW (ref 13.0–17.0)
MCH: 27.7 pg (ref 26.0–34.0)
MCHC: 31.1 g/dL (ref 30.0–36.0)
MCV: 89.1 fL (ref 80.0–100.0)
Platelets: 101 K/uL — ABNORMAL LOW (ref 150–400)
RBC: 4.48 MIL/uL (ref 4.22–5.81)
RDW: 16.5 % — ABNORMAL HIGH (ref 11.5–15.5)
WBC: 3.1 K/uL — ABNORMAL LOW (ref 4.0–10.5)
nRBC: 0 % (ref 0.0–0.2)

## 2024-11-16 LAB — BASIC METABOLIC PANEL WITH GFR
Anion gap: 12 (ref 5–15)
BUN: 57 mg/dL — ABNORMAL HIGH (ref 8–23)
CO2: 29 mmol/L (ref 22–32)
Calcium: 8.2 mg/dL — ABNORMAL LOW (ref 8.9–10.3)
Chloride: 100 mmol/L (ref 98–111)
Creatinine, Ser: 2.29 mg/dL — ABNORMAL HIGH (ref 0.61–1.24)
GFR, Estimated: 27 mL/min — ABNORMAL LOW
Glucose, Bld: 129 mg/dL — ABNORMAL HIGH (ref 70–99)
Potassium: 3.3 mmol/L — ABNORMAL LOW (ref 3.5–5.1)
Sodium: 142 mmol/L (ref 135–145)

## 2024-11-16 LAB — TROPONIN T, HIGH SENSITIVITY: Troponin T High Sensitivity: 109 ng/L (ref 0–19)

## 2024-11-16 LAB — PRO BRAIN NATRIURETIC PEPTIDE: Pro Brain Natriuretic Peptide: >35000 pg/mL — ABNORMAL HIGH

## 2024-11-16 LAB — CBG MONITORING, ED: Glucose-Capillary: 207 mg/dL — ABNORMAL HIGH (ref 70–99)

## 2024-11-16 MED ORDER — INSULIN ASPART 100 UNIT/ML IJ SOLN
0.0000 [IU] | Freq: Every day | INTRAMUSCULAR | Status: DC
  Administered 2024-11-16 – 2024-11-22 (×2): 2 [IU] via SUBCUTANEOUS
  Filled 2024-11-16 (×2): qty 2

## 2024-11-16 MED ORDER — CARVEDILOL 6.25 MG PO TABS
6.2500 mg | ORAL_TABLET | Freq: Two times a day (BID) | ORAL | Status: DC
  Administered 2024-11-16 – 2024-11-23 (×14): 6.25 mg via ORAL
  Filled 2024-11-16 (×13): qty 1

## 2024-11-16 MED ORDER — PANTOPRAZOLE SODIUM 40 MG PO TBEC
40.0000 mg | DELAYED_RELEASE_TABLET | Freq: Two times a day (BID) | ORAL | Status: DC
  Administered 2024-11-16 – 2024-11-23 (×14): 40 mg via ORAL
  Filled 2024-11-16 (×14): qty 1

## 2024-11-16 MED ORDER — INSULIN ASPART 100 UNIT/ML IJ SOLN
0.0000 [IU] | Freq: Three times a day (TID) | INTRAMUSCULAR | Status: DC
  Administered 2024-11-17 – 2024-11-18 (×2): 3 [IU] via SUBCUTANEOUS
  Administered 2024-11-18: 2 [IU] via SUBCUTANEOUS
  Administered 2024-11-19: 5 [IU] via SUBCUTANEOUS
  Administered 2024-11-20 – 2024-11-21 (×2): 3 [IU] via SUBCUTANEOUS
  Administered 2024-11-21: 2 [IU] via SUBCUTANEOUS
  Administered 2024-11-22: 3 [IU] via SUBCUTANEOUS
  Administered 2024-11-22: 2 [IU] via SUBCUTANEOUS
  Administered 2024-11-23: 5 [IU] via SUBCUTANEOUS
  Filled 2024-11-16 (×2): qty 2
  Filled 2024-11-16 (×2): qty 3
  Filled 2024-11-16: qty 2
  Filled 2024-11-16: qty 5
  Filled 2024-11-16: qty 3
  Filled 2024-11-16 (×3): qty 5

## 2024-11-16 MED ORDER — POTASSIUM CHLORIDE CRYS ER 20 MEQ PO TBCR
40.0000 meq | EXTENDED_RELEASE_TABLET | Freq: Once | ORAL | Status: AC
  Administered 2024-11-16: 40 meq via ORAL
  Filled 2024-11-16: qty 2

## 2024-11-16 MED ORDER — SPIRONOLACTONE 25 MG PO TABS
25.0000 mg | ORAL_TABLET | Freq: Every day | ORAL | Status: DC
  Administered 2024-11-17 – 2024-11-23 (×7): 25 mg via ORAL
  Filled 2024-11-16 (×7): qty 1

## 2024-11-16 MED ORDER — APIXABAN 2.5 MG PO TABS
2.5000 mg | ORAL_TABLET | Freq: Two times a day (BID) | ORAL | Status: DC
  Administered 2024-11-16 – 2024-11-23 (×14): 2.5 mg via ORAL
  Filled 2024-11-16 (×15): qty 1

## 2024-11-16 MED ORDER — ROSUVASTATIN CALCIUM 20 MG PO TABS
20.0000 mg | ORAL_TABLET | Freq: Every day | ORAL | Status: DC
  Administered 2024-11-16 – 2024-11-23 (×8): 20 mg via ORAL
  Filled 2024-11-16: qty 1
  Filled 2024-11-16: qty 2
  Filled 2024-11-16: qty 1
  Filled 2024-11-16: qty 2
  Filled 2024-11-16: qty 1
  Filled 2024-11-16 (×3): qty 2

## 2024-11-16 MED ORDER — ACETAMINOPHEN 325 MG PO TABS
650.0000 mg | ORAL_TABLET | Freq: Four times a day (QID) | ORAL | Status: DC | PRN
  Administered 2024-11-18 – 2024-11-22 (×4): 650 mg via ORAL
  Filled 2024-11-16 (×4): qty 2

## 2024-11-16 MED ORDER — ACETAMINOPHEN 650 MG RE SUPP: 650.0000 mg | Freq: Four times a day (QID) | RECTAL | Status: DC | PRN

## 2024-11-16 MED ORDER — FUROSEMIDE 10 MG/ML IJ SOLN
120.0000 mg | Freq: Two times a day (BID) | INTRAVENOUS | Status: DC
  Administered 2024-11-17 – 2024-11-22 (×12): 120 mg via INTRAVENOUS
  Filled 2024-11-16 (×8): qty 12
  Filled 2024-11-16: qty 4
  Filled 2024-11-16 (×7): qty 12
  Filled 2024-11-16: qty 4

## 2024-11-16 MED ORDER — FUROSEMIDE 10 MG/ML IJ SOLN
80.0000 mg | Freq: Once | INTRAMUSCULAR | Status: AC
  Administered 2024-11-16: 80 mg via INTRAVENOUS
  Filled 2024-11-16: qty 8

## 2024-11-16 MED ORDER — POLYETHYLENE GLYCOL 3350 17 G PO PACK: 17.0000 g | PACK | Freq: Every day | ORAL | Status: DC | PRN

## 2024-11-16 MED ORDER — SODIUM CHLORIDE 0.9% FLUSH
3.0000 mL | Freq: Two times a day (BID) | INTRAVENOUS | Status: DC
  Administered 2024-11-16 – 2024-11-23 (×13): 3 mL via INTRAVENOUS
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
## 2024-11-16 NOTE — ED Notes (Signed)
 First nurse note: To ED from HF clinic for CHF exascerbation: SOB at rest, edema from feet to thighs, rales and rhonchi. Recently had some med changes, spironolactone  and Entresto  stopped. VSS. Ambulatory for short distances.

## 2024-11-16 NOTE — ED Triage Notes (Signed)
 Pt presents to ED from heart failure clinic C/O bilateral leg swelling and SOB, worsening over the last week.

## 2024-11-16 NOTE — H&P (Signed)
 " History and Physical   [name]:[id number] DOB: Mar 21, 1935 DOA: 11/16/2024  PCP: [name], MD   Patient coming from: Advanced heart failure clinic  Chief Complaint: Shortness of breath, edema  HPI: [name] is a 89 y.o. male with medical history significant of hypertension, hyperlipidemia, GERD, diabetes, CAD status post CABG, paroxysmal atrial fibrillation, bradycardia, V-fib arrest, complete heart block, status post pacemaker/AICD, chronic systolic CHF, CKD 4, obesity, prostate cancer, gout, thrombocytopenia presenting with worsening edema and shortness of breath.  Patient with recurrent admissions for advanced CHF.  Most recently was admitted to [facility] from 12/26 until 12/30.  Repeat echocardiogram there showed EF 20% (previously 25-30%).  Diuresed with improvement.  Was discharged on torsemide  twice daily and his spironolactone , Entresto , Farxiga  was held.  Followed up with the heart failure clinic today found to have worsening edema and shortness of breath despite taking torsemide  alternating 40 and 60 mg.  Patient sent to the ED for IV diuresis.  Patient denies fevers, chills, abdominal pain, constipation, diarrhea, nausea, vomiting.  ED Course: Vital signs in the ED stable.  Lab workup included BMP with potassium 3.3, BUN 57, creatinine stable at 2.29, glucose 129, calcium  8.2.  CBC with leukopenia 3.1 and platelets stable at 101.  proBNP elevated to greater than 35,000.  Troponin 109 on first check, repeat pending.  Chest x-ray with minimal basilar atelectasis with small pleural effusion.  Patient received 80 mg IV Lasix  in the ED  Review of Systems: As per HPI otherwise all other systems reviewed and are negative.  Past Medical History:  Diagnosis Date   ACS (acute coronary syndrome) (HCC) 06/03/2023   AV block, Mobitz 1    CAD (coronary artery disease)    a. 08/2019 VF arrest-->sev 3vd on cath-->CABGx5 (LIMA->LAD, VG->OM->LCX, VG->RPDA, VG->Diag); b.  08/2021 Cath: sev native multivessel dzs w/ 5/5 patent grafts. Nl filling pressures->Med rx; c. 05/2023 Cath: stable anatomy->Med rx.   CKD (chronic kidney disease), stage III (HCC)    COVID-19 12/23/2023   Diabetes mellitus without complication (HCC)    Elevated lactic acid level 10/16/2024   Elevated troponin 03/29/2022   HFrEF (heart failure with reduced ejection fraction) (HCC)    a. 08/2020 Echo: EF 40-45%; b. 11/2020 Echo: EF 55%, Gr2 DD; c. 08/2021 Echo: EF 35-40%, glob HK w/ ant/antsept/apical HK; d. 10/2021 s/p CRT-P; e. 12/2021 Echo: EF 30-35%, glob HK, GrIII DD; f. 12/2022 Echo: EF 30-35%, glob HK, sev HK of ant wall, mod asymm LVH, nl RV fxn, mildly dil LA, triv MR, AoV sclerosis.   Hyperlipidemia LDL goal <70    Hypertension    Hypertensive urgency 03/29/2022   Hypothermia 10/16/2024   Intermittent Complete heart block (HCC)    a. 08/2021 noted on Zio; b. 10/2021 s/p Abbott Allure RF CRT-P (Ser # [id number]).   Ischemic cardiomyopathy    a. 08/2020 Echo: EF 40-45%; b. 11/2020 Echo: EF 55%; c. 08/2021 Echo: EF 35-40%; d. 10/2021 s/p CRT-P; e. 12/2021 Echo: EF 30-35%, glob HK, GrIII DD.   Malignant neoplasm of prostate (HCC) 10/05/2005   Myocardial injury 10/16/2024   Non-ST elevation (NSTEMI) myocardial infarction (HCC) 08/21/2020   PAF (paroxysmal atrial fibrillation) (HCC)    a.Medical device check-->Eliquis  (CHA2DS2VASc = 6)   Proctitis 10/16/2024   Status post percutaneous transluminal coronary angioplasty 10/08/2008   Trifascicular block     Past Surgical History:  Procedure Laterality Date   BACK SURGERY     CARDIAC CATHETERIZATION     CLIPPING OF  ATRIAL APPENDAGE N/A 08/29/2020   Procedure: CLIPPING OF ATRIAL APPENDAGE USING ATRICURE 45 MM ATRICLIP FLEX-V;  Surgeon: Army Dallas NOVAK, MD;  Location: [facility] OR;  Service: Open Heart Surgery;  Laterality: N/A;   CORONARY ARTERY BYPASS GRAFT N/A 08/29/2020   Procedure: CORONARY ARTERY BYPASS GRAFTING (CABG) X 5 USING LEFT INTERNAL  MAMMARY ARTERY AND ENDOSCOPICALLY HARVESTED RIGHT GREATER SAPHENOUS VEIN. LIMA TO LAD, SVG TO OM SEQ TO CIRC, SVG TO PD, SVG TO DIAG.;  Surgeon: Army Dallas NOVAK, MD;  Location: [facility] OR;  Service: Open Heart Surgery;  Laterality: N/A;   ENDOVEIN HARVEST OF GREATER SAPHENOUS VEIN Right 08/29/2020   Procedure: ENDOVEIN HARVEST OF GREATER SAPHENOUS VEIN;  Surgeon: Army Dallas NOVAK, MD;  Location: [facility] OR;  Service: Open Heart Surgery;  Laterality: Right;   HERNIA REPAIR     LEFT HEART CATH AND CORONARY ANGIOGRAPHY N/A 08/25/2020   Procedure: LEFT HEART CATH AND CORONARY ANGIOGRAPHY;  Surgeon: Darron Deatrice LABOR, MD;  Location: [facility] INVASIVE CV LAB;  Service: Cardiovascular;  Laterality: N/A;   PACEMAKER IMPLANT N/A 10/12/2021   Procedure: PACEMAKER IMPLANT;  Surgeon: Cindie Ole DASEN, MD;  Location: [facility] INVASIVE CV LAB;  Service: Cardiovascular;  Laterality: N/A;   RIGHT/LEFT HEART CATH AND CORONARY ANGIOGRAPHY N/A 08/31/2021   Procedure: RIGHT/LEFT HEART CATH AND CORONARY ANGIOGRAPHY;  Surgeon: Darron Deatrice LABOR, MD;  Location: [facility] INVASIVE CV LAB;  Service: Cardiovascular;  Laterality: N/A;   RIGHT/LEFT HEART CATH AND CORONARY/GRAFT ANGIOGRAPHY N/A 06/06/2023   Procedure: RIGHT/LEFT HEART CATH AND CORONARY/GRAFT ANGIOGRAPHY;  Surgeon: Darron Deatrice LABOR, MD;  Location: [facility] INVASIVE CV LAB;  Service: Cardiovascular;  Laterality: N/A;   TEE WITHOUT CARDIOVERSION N/A 08/29/2020   Procedure: TRANSESOPHAGEAL ECHOCARDIOGRAM (TEE);  Surgeon: Army Dallas NOVAK, MD;  Location[facility] OR;  Service: Open Heart Surgery;  Laterality: N/A;    Social History  reports that he quit smoking about 28 years ago. His smoking use included cigarettes. He has never used smokeless tobacco. He reports that he does not currently use alcohol. He reports that he does not use drugs.  Allergies[1]  Family History  Problem Relation Age of Onset   Heart attack Mother   Reviewed on admission  Prior to Admission medications   Medication Sig Start Date End Date Taking? Authorizing Provider  acetaminophen  (TYLENOL ) 500 MG tablet Take 1,000 mg by mouth every 6 (six) hours as needed for mild pain, fever or headache.    [provider]  acyclovir  (ZOVIRAX ) 800 MG tablet Take 800 mg by mouth daily.    [provider]  albuterol  (VENTOLIN  HFA) 108 (90 Base) MCG/ACT inhaler Inhale 2 puffs into the lungs every 6 (six) hours as needed for wheezing or shortness of breath. 09/25/24 09/25/25  [provider]  apixaban  (ELIQUIS ) 2.5 MG TABS tablet TAKE 1 TABLET BY MOUTH TWICE A DAY 07/24/24   Terra Fairy PARAS, PA-C  carvedilol  (COREG ) 12.5 MG tablet Take 1 tablet (12.5 mg total) by mouth 2 (two) times daily with a meal. Patient taking differently: Take 6.25 mg by mouth 2 (two) times daily with a meal. 10/09/24   Donette Ellouise LABOR, FNP  dapagliflozin  propanediol (FARXIGA ) 10 MG TABS tablet Take 1 tablet (10 mg total) by mouth daily. 12/27/23   Awanda Ellouise, MD  diclofenac  Sodium (VOLTAREN ) 1 % GEL Apply 2 g topically 4 (four) times daily. 09/21/24 09/21/25  [provider]  ezetimibe  (ZETIA ) 10 MG tablet TAKE 1 TABLET BY MOUTH EVERY DAY 06/29/24   McLean, Dalton  S, MD  Insulin  Glargine (BASAGLAR  KWIKPEN) 100 UNIT/ML Inject 15 Units into the skin at bedtime. 02/19/24 02/18/25  [provider]  metolazone  (ZAROXOLYN ) 2.5 MG tablet Take 2.5 mg by mouth 2 (two) times a week. Every Tuesday and Friday    [provider]  nitroGLYCERIN  (NITROSTAT ) 0.4 MG SL tablet Place 0.4 mg under the tongue every 5 (five) minutes as needed for chest pain. 09/25/24   [provider]  pantoprazole  (PROTONIX ) 40 MG tablet Take 1 tablet (40 mg total) by mouth 2 (two) times daily. Patient taking differently: Take 40 mg by mouth daily as needed. 09/17/24   Amin, Sumayya, MD  polyethylene glycol (MIRALAX  / GLYCOLAX ) 17 g packet Take 17 g by mouth daily as needed for moderate constipation. 12/19/22   Josette Ade, MD  potassium chloride  SA (KLOR-CON  M) 20 MEQ tablet Take 2 tablets (40 mEq total) by mouth daily. 10/09/24   Donette Ellouise LABOR, FNP  rosuvastatin  (CRESTOR ) 40 MG tablet Take 1 tablet (40 mg total) by mouth daily. Patient not taking: Reported on 10/16/2024 05/25/23   Gerard Frederick, NP  sacubitril -valsartan  (ENTRESTO ) 49-51 MG Take 1 tablet by mouth 2 (two) times daily. 10/09/24   Donette Ellouise LABOR, FNP  spironolactone  (ALDACTONE ) 25 MG tablet Take 1 tablet (25 mg total) by mouth daily. 04/19/24   Donette Ellouise LABOR, FNP  tamsulosin  (FLOMAX ) 0.4 MG CAPS capsule Take 0.4 mg by mouth daily.    [provider]  torsemide  (DEMADEX ) 20 MG tablet Take 40-60 mg by mouth daily. Alternating days 40 mg and 60 mg 07/11/24   [provider]    Physical Exam: Vitals:   11/16/24 1510 11/16/24 1516  BP: 132/86   Pulse: 70   Resp: 16   Temp: (!) 97.3 F (36.3 C)   TempSrc: Oral   SpO2: 98%   Weight:  108.9 kg  Height:  5' 8 (1.727 m)    Physical Exam Constitutional:      General: He is not in acute distress.    Appearance: Normal appearance.  HENT:     Head: Normocephalic and atraumatic.     Mouth/Throat:     Mouth: Mucous membranes are moist.     Pharynx: Oropharynx is clear.  Eyes:     Extraocular Movements: Extraocular movements intact.     Pupils: Pupils are equal, round, and reactive to light.  Cardiovascular:     Rate and Rhythm: Normal rate and regular rhythm.     Pulses: Normal pulses.     Heart sounds: Normal heart sounds.  Pulmonary:     Effort: Pulmonary effort is normal. No respiratory distress.     Breath sounds: Rales present.  Abdominal:     General: Bowel sounds are normal. There is no distension.     Palpations: Abdomen is soft.     Tenderness: There is no abdominal tenderness.  Musculoskeletal:        General: No swelling or deformity.     Right lower leg: Edema present.     Left lower leg: Edema present.  Skin:    General: Skin is warm and dry.   Neurological:     General: No focal deficit present.     Mental Status: Mental status is at baseline.    Labs on Admission: I have personally reviewed following labs and imaging studies  CBC: Recent Labs  Lab 11/16/24 1519  WBC 3.1*  HGB 12.4*  HCT 39.9  MCV 89.1  PLT 101*  Basic Metabolic Panel: Recent Labs  Lab 11/16/24 1519  NA 142  K 3.3*  CL 100  CO2 29  GLUCOSE 129*  BUN 57*  CREATININE 2.29*  CALCIUM  8.2*    GFR: Estimated Creatinine Clearance: 27.2 mL/min (A) (by C-G formula based on SCr of 2.29 mg/dL (H)).  Liver Function Tests: No results for input(s): AST, ALT, ALKPHOS, BILITOT, PROT, ALBUMIN  in the last 168 hours.  Urine analysis:    Component Value Date/Time   COLORURINE AMBER (A) 10/15/2024 2129   APPEARANCEUR CLOUDY (A) 10/15/2024 2129   LABSPEC 1.019 10/15/2024 2129   PHURINE 5.0 10/15/2024 2129   GLUCOSEU NEGATIVE 10/15/2024 2129   HGBUR MODERATE (A) 10/15/2024 2129   BILIRUBINUR NEGATIVE 10/15/2024 2129   KETONESUR NEGATIVE 10/15/2024 2129   PROTEINUR 100 (A) 10/15/2024 2129   NITRITE NEGATIVE 10/15/2024 2129   LEUKOCYTESUR LARGE (A) 10/15/2024 2129    Radiological Exams on Admission: DG Chest 2 View Result Date: 11/16/2024 CLINICAL DATA:  Shortness of breath EXAM: CHEST - 2 VIEW COMPARISON:  October 15, 2024 FINDINGS: Stable cardiomegaly. Status post coronary artery bypass graft. Left-sided pacemaker is unchanged. Minimal left basilar atelectasis is noted with small left pleural effusion. Minimal right basilar subsegmental atelectasis. Bony thorax is unremarkable. IMPRESSION: Minimal bibasilar subsegmental atelectasis with small left pleural effusion. Electronically Signed   By: Lynwood Landy Raddle M.D.   On: 11/16/2024 16:13   EKG: Independently reviewed.  Ventricular paced rhythm at 70 bpm.  Minimal baseline wander.  Nonspecific T wave changes.  Assessment/Plan Active Problems:   CAD, s/p CABG x 5   Type 2 diabetes  mellitus with hyperlipidemia (HCC)   HTN (hypertension)   HLD (hyperlipidemia)   Paroxysmal atrial fibrillation (HCC)   GERD without esophagitis   CKD (chronic kidney disease) stage 4, GFR 15-29 ml/min (HCC)   Thrombocytopenia   Obesity (BMI 30-39.9)   History of sustained ventricular fibrillation   History of cardiac arrest   S/P CABG x 5   Complete heart block s/p pacemaker [facility])   Cardiac resynchronization therapy pacemaker (CRT-P) in place   Bradycardia   History of prostate cancer   Acute on chronic systolic (congestive) heart failure (HCC)   Acute on chronic systolic CHF Hypokalemia > Last echo in our system was October with EF 25-30%, indeterminate diastolic function, mildly reduced RV function, large pericardial effusion.  Repeat echocardiogram at [facility] on 12/26 showed EF reduced to 20%. > Followed up with heart failure clinic today with volume overload and sent to the ED for IV diuresis. > Received 80 mg IV Lasix  in the ED.  Potassium 3.3. - Monitor in progressive unit - Consult cardiology/heart failure, they will see in the morning of 1/10. - Continue with Lasix  120 mg IV twice daily - Restart home spironolactone  tomorrow - Continue home Coreg  - Hold Entresto , until cardiology weighs in - Strict I's and O's, daily weights - No repeat echocardiogram at this time - Check magnesium  - Replete potassium -Trend renal function and electrolytes  Hypertension - Lasix  as above - Continue home Coreg  - Restart spironolactone  -Holding Entresto   Hyperlipidemia - Continue home statin  GERD - Continue home PPI  Diabetes - SSI  CAD > Status post CABG x 5 - Continue Coreg , statin, Eliquis   Paroxysmal atrial fibrillation - Continue home Eliquis   History of V-fib Complete heart block - Status post pacemaker/synchronization device  CKD 4 > Creatinine stable at 2.29 in the ED - Trend renal function and electrolytes  Obesity - Noted  History of prostate cancer -  Follows with [facility] oncology  Gout - Noted  Thrombocytopenia > Platelets stable at 101 - Initial workup at recent [facility] admission unrevealing   DVT prophylaxis: Eliquis  Code Status:   Full (Did discuss with patient and family the likelihood of a poor outcome with ACLS given the severity of his chronic conditions, wish to remain full code for now) Family Communication:  Updated at bedside  Disposition Plan:   Patient is from:  Home  Anticipated DC to:  Home  Anticipated DC date:  2 to 5 days  Anticipated DC barriers: None  Consults called:  Cardiology/advanced heart failure Admission status:  Inpatient, progressive  Severity of Illness: The appropriate patient status for this patient is INPATIENT. Inpatient status is judged to be reasonable and necessary in order to provide the required intensity of service to ensure the patient's safety. The patient's presenting symptoms, physical exam findings, and initial radiographic and laboratory data in the context of their chronic comorbidities is felt to place them at high risk for further clinical deterioration. Furthermore, it is not anticipated that the patient will be medically stable for discharge from the hospital within 2 midnights of admission.   * I certify that at the point of admission it is my clinical judgment that the patient will require inpatient hospital care spanning beyond 2 midnights from the point of admission due to high intensity of service, high risk for further deterioration and high frequency of surveillance required.DEWAINE Marsa KATHEE Seena MD [facility]  How to contact the [facility] Attending or Consulting provider 7A - 7P or covering provider during after hours 7P -7A, for this patient?   Check the care team in [facility] and look for a) attending/consulting [facility] provider listed and b) the [facility] team listed Log into [email or web address] and use [facility] universal password to access. If you do not have the password, please contact  the hospital operator. Locate the [facility] provider you are looking for under [facility] and page to a number that you can be directly reached. If you still have difficulty reaching the provider, please page the [facility] (Director on Call) for the Hospitalists listed on amion for assistance.  11/16/2024, 5:14 PM       [1]  Allergies Allergen Reactions   Angiotensin Receptor Blockers     hyperkalemia   Metformin Diarrhea   "

## 2024-11-17 DIAGNOSIS — N1832 Chronic kidney disease, stage 3b: Secondary | ICD-10-CM

## 2024-11-17 DIAGNOSIS — I480 Paroxysmal atrial fibrillation: Secondary | ICD-10-CM | POA: Diagnosis not present

## 2024-11-17 DIAGNOSIS — I5023 Acute on chronic systolic (congestive) heart failure: Secondary | ICD-10-CM | POA: Diagnosis not present

## 2024-11-17 DIAGNOSIS — Z950 Presence of cardiac pacemaker: Secondary | ICD-10-CM | POA: Diagnosis not present

## 2024-11-17 DIAGNOSIS — Z8679 Personal history of other diseases of the circulatory system: Secondary | ICD-10-CM | POA: Diagnosis not present

## 2024-11-17 LAB — COMPREHENSIVE METABOLIC PANEL WITH GFR
ALT: 15 U/L (ref 0–44)
AST: 23 U/L (ref 15–41)
Albumin: 3.4 g/dL — ABNORMAL LOW (ref 3.5–5.0)
Alkaline Phosphatase: 105 U/L (ref 38–126)
Anion gap: 11 (ref 5–15)
BUN: 58 mg/dL — ABNORMAL HIGH (ref 8–23)
CO2: 31 mmol/L (ref 22–32)
Calcium: 7.8 mg/dL — ABNORMAL LOW (ref 8.9–10.3)
Chloride: 102 mmol/L (ref 98–111)
Creatinine, Ser: 2.42 mg/dL — ABNORMAL HIGH (ref 0.61–1.24)
GFR, Estimated: 25 mL/min — ABNORMAL LOW
Glucose, Bld: 165 mg/dL — ABNORMAL HIGH (ref 70–99)
Potassium: 3.6 mmol/L (ref 3.5–5.1)
Sodium: 144 mmol/L (ref 135–145)
Total Bilirubin: 0.4 mg/dL (ref 0.0–1.2)
Total Protein: 6.7 g/dL (ref 6.5–8.1)

## 2024-11-17 LAB — CBC
HCT: 30.1 % — ABNORMAL LOW (ref 39.0–52.0)
Hemoglobin: 9.1 g/dL — ABNORMAL LOW (ref 13.0–17.0)
MCH: 27.7 pg (ref 26.0–34.0)
MCHC: 30.2 g/dL (ref 30.0–36.0)
MCV: 91.8 fL (ref 80.0–100.0)
Platelets: 79 K/uL — ABNORMAL LOW (ref 150–400)
RBC: 3.28 MIL/uL — ABNORMAL LOW (ref 4.22–5.81)
RDW: 16.8 % — ABNORMAL HIGH (ref 11.5–15.5)
WBC: 2.2 K/uL — ABNORMAL LOW (ref 4.0–10.5)
nRBC: 0 % (ref 0.0–0.2)

## 2024-11-17 LAB — CBG MONITORING, ED
Glucose-Capillary: 115 mg/dL — ABNORMAL HIGH (ref 70–99)
Glucose-Capillary: 116 mg/dL — ABNORMAL HIGH (ref 70–99)
Glucose-Capillary: 151 mg/dL — ABNORMAL HIGH (ref 70–99)
Glucose-Capillary: 183 mg/dL — ABNORMAL HIGH (ref 70–99)

## 2024-11-17 LAB — TROPONIN T, HIGH SENSITIVITY
Troponin T High Sensitivity: 105 ng/L (ref 0–19)
Troponin T High Sensitivity: 108 ng/L (ref 0–19)

## 2024-11-17 LAB — MAGNESIUM: Magnesium: 1.7 mg/dL (ref 1.7–2.4)

## 2024-11-17 MED ORDER — GUAIFENESIN ER 600 MG PO TB12
600.0000 mg | ORAL_TABLET | Freq: Two times a day (BID) | ORAL | Status: DC
  Administered 2024-11-17 – 2024-11-23 (×14): 600 mg via ORAL
  Filled 2024-11-17 (×14): qty 1

## 2024-11-17 MED ORDER — SACUBITRIL-VALSARTAN 24-26 MG PO TABS
1.0000 | ORAL_TABLET | Freq: Two times a day (BID) | ORAL | Status: DC
  Administered 2024-11-17 – 2024-11-23 (×13): 1 via ORAL
  Filled 2024-11-17 (×14): qty 1

## 2024-11-17 MED ORDER — IPRATROPIUM-ALBUTEROL 0.5-2.5 (3) MG/3ML IN SOLN
3.0000 mL | Freq: Four times a day (QID) | RESPIRATORY_TRACT | Status: DC
  Administered 2024-11-17 – 2024-11-19 (×9): 3 mL via RESPIRATORY_TRACT
  Filled 2024-11-17 (×7): qty 3

## 2024-11-17 MED ORDER — HYDROCOD POLI-CHLORPHE POLI ER 10-8 MG/5ML PO SUER
5.0000 mL | Freq: Two times a day (BID) | ORAL | Status: DC | PRN
  Administered 2024-11-18 – 2024-11-22 (×5): 5 mL via ORAL
  Filled 2024-11-17 (×5): qty 5

## 2024-11-17 MED ORDER — METOLAZONE 5 MG PO TABS
5.0000 mg | ORAL_TABLET | Freq: Once | ORAL | Status: AC
  Administered 2024-11-17: 5 mg via ORAL
  Filled 2024-11-17: qty 1
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
## 2024-11-17 NOTE — Progress Notes (Signed)
 " PROGRESS NOTE    [name]  FMW:[id number] DOB: 04/02/1935 DOA: 11/16/2024 PCP: [name], MD  Assessment & Plan:   Principal Problem:   Acute on chronic systolic (congestive) heart failure (HCC) Active Problems:   CAD, s/p CABG x 5   Type 2 diabetes mellitus with hyperlipidemia (HCC)   HTN (hypertension)   HLD (hyperlipidemia)   Paroxysmal atrial fibrillation (HCC)   GERD without esophagitis   CKD (chronic kidney disease) stage 4, GFR 15-29 ml/min (HCC)   Thrombocytopenia   Obesity (BMI 30-39.9)   History of sustained ventricular fibrillation   History of cardiac arrest   S/P CABG x 5   Complete heart block s/p pacemaker [facility])   Cardiac resynchronization therapy pacemaker (CRT-P) in place   Bradycardia   History of prostate cancer  Assessment and Plan:  Acute on chronic systolic CHF: continue on IV lasix  drip. Monitor I/Os. Fluid restriction. Last echo  October with EF 25-30%, indeterminate diastolic function, mildly reduced RV function, large pericardial effusion.  Repeat echocardiogram at [facility] on 12/26 showed EF reduced to 20%. Continue on coreg , entresto , aldactone  as per cardio. Cardio following and recs apprec   Hypokalemia: WNL today    HTN: continue on coreg , entresto , lasix , aldactone    HLD: continue on statin    GERD: continue on PPI   DM2: fair control, HbA1c 7.3 in 06/2024. Continue on SSI w/ accuchecks    Hx of CAD: s/p CABG. Continue on coreg , entresto , aldactone , statin,   PAF: continue on coreg , eliquis     Hx of V-fib: as well as complete heart block. S/p pacemaker    CKDIV: Cr is trending up from day prior. Will continue to monitor    Obesity: BMI 36.4. Would benefit from weight loss  - Noted   Hx of prostate cancer: management per [facility] oncology  - Follows with [place] oncology   Gout: not taking any meds for this currently    Thrombocytopenia: labile. Will continue to monitor      DVT prophylaxis: eliquis  Code Status: full  Family  Communication: called pt's wife, [name], but no answer  Disposition Plan: depends on PT/OT recs   Level of care: Progressive  Status is: Inpatient Remains inpatient appropriate because: severity of illness    Consultants:  Cardio   Procedures:   Antimicrobials:    Subjective: Pt c/o feeling bad.  Objective: Vitals:   11/17/24 0700 11/17/24 0730 11/17/24 0830 11/17/24 0900  BP: (!) 134/98 (!) 135/93 (!) 139/96 (!) 144/87  Pulse: 61 85 65 (!) 59  Resp:  17 19 (!) 23  Temp:      TempSrc:      SpO2: 97% 97% (!) 89% 98%  Weight:      Height:        Intake/Output Summary (Last 24 hours) at 11/17/2024 1049 Last data filed at 11/17/2024 0124 Gross per 24 hour  Intake 3 ml  Output 250 ml  Net -247 ml   Filed Weights   11/16/24 1516  Weight: 108.9 kg    Examination:  General exam: Appears uncomfortable  Respiratory system: course breath sounds b/l  Cardiovascular system: S1 & S2 +. No rubs, gallops or clicks. 2+ pitting edema b/l LE  Gastrointestinal system: Abdomen is obese soft and nontender. Hypoactive bowel sounds heard. Central nervous system: Alert and oriented.  Psychiatry: Judgement and insight appears at baseline. Flat mood and affect    Data Reviewed: I have personally reviewed following labs and imaging studies  CBC:  Recent Labs  Lab 11/16/24 1519 11/17/24 0108  WBC 3.1* 2.2*  HGB 12.4* 9.1*  HCT 39.9 30.1*  MCV 89.1 91.8  PLT 101* 79*   Basic Metabolic Panel: Recent Labs  Lab 11/16/24 1519 11/16/24 1718 11/17/24 0108  NA 142  --  144  K 3.3*  --  3.6  CL 100  --  102  CO2 29  --  31  GLUCOSE 129*  --  165*  BUN 57*  --  58*  CREATININE 2.29*  --  2.42*  CALCIUM  8.2*  --  7.8*  MG  --  1.7  --    GFR: Estimated Creatinine Clearance: 25.7 mL/min (A) (by C-G formula based on SCr of 2.42 mg/dL (H)). Liver Function Tests: Recent Labs  Lab 11/17/24 0108  AST 23  ALT 15  ALKPHOS 105  BILITOT 0.4  PROT 6.7  ALBUMIN  3.4*    No results for input(s): LIPASE, AMYLASE in the last 168 hours. No results for input(s): AMMONIA in the last 168 hours. Coagulation Profile: No results for input(s): INR, PROTIME in the last 168 hours. Cardiac Enzymes: No results for input(s): CKTOTAL, CKMB, CKMBINDEX, TROPONINI in the last 168 hours. BNP (last 3 results) Recent Labs    10/10/24 1058 10/15/24 1935 11/16/24 1519  PROBNP 24,507.0* >35,000.0* >35,000.0*   HbA1C: No results for input(s): HGBA1C in the last 72 hours. CBG: Recent Labs  Lab 11/16/24 2217 11/17/24 0906  GLUCAP 207* 115*   Lipid Profile: No results for input(s): CHOL, HDL, LDLCALC, TRIG, CHOLHDL, LDLDIRECT in the last 72 hours. Thyroid Function Tests: No results for input(s): TSH, T4TOTAL, FREET4, T3FREE, THYROIDAB in the last 72 hours. Anemia Panel: No results for input(s): VITAMINB12, FOLATE, FERRITIN, TIBC, IRON, RETICCTPCT in the last 72 hours. Sepsis Labs: No results for input(s): PROCALCITON, LATICACIDVEN in the last 168 hours.  No results found for this or any previous visit (from the past 240 hours).       Radiology Studies: DG Chest 2 View Result Date: 11/16/2024 CLINICAL DATA:  Shortness of breath EXAM: CHEST - 2 VIEW COMPARISON:  October 15, 2024 FINDINGS: Stable cardiomegaly. Status post coronary artery bypass graft. Left-sided pacemaker is unchanged. Minimal left basilar atelectasis is noted with small left pleural effusion. Minimal right basilar subsegmental atelectasis. Bony thorax is unremarkable. IMPRESSION: Minimal bibasilar subsegmental atelectasis with small left pleural effusion. Electronically Signed   By: Lynwood Landy Raddle M.D.   On: 11/16/2024 16:13        Scheduled Meds:  apixaban   2.5 mg Oral BID   carvedilol   6.25 mg Oral BID WC   guaiFENesin   600 mg Oral BID   insulin  aspart  0-15 Units Subcutaneous TID WC   insulin  aspart  0-5 Units Subcutaneous QHS    ipratropium-albuterol   3 mL Nebulization QID   pantoprazole   40 mg Oral BID   rosuvastatin   20 mg Oral Daily   sacubitril -valsartan   1 tablet Oral BID   sodium chloride  flush  3 mL Intravenous Q12H   spironolactone   25 mg Oral Daily   Continuous Infusions:  furosemide  120 mg (11/17/24 1019)     LOS: 1 day     Anthony CHRISTELLA Pouch, MD [facility] Pager [phone]  If 7PM-7AM, please contact night-coverage [email or web address] 11/17/2024, 10:49 AM   "

## 2024-11-18 ENCOUNTER — Other Ambulatory Visit: Payer: Self-pay

## 2024-11-18 DIAGNOSIS — I480 Paroxysmal atrial fibrillation: Secondary | ICD-10-CM | POA: Diagnosis not present

## 2024-11-18 DIAGNOSIS — N1832 Chronic kidney disease, stage 3b: Secondary | ICD-10-CM | POA: Diagnosis not present

## 2024-11-18 DIAGNOSIS — Z950 Presence of cardiac pacemaker: Secondary | ICD-10-CM | POA: Diagnosis not present

## 2024-11-18 DIAGNOSIS — I5023 Acute on chronic systolic (congestive) heart failure: Secondary | ICD-10-CM | POA: Diagnosis not present

## 2024-11-18 LAB — TROPONIN T, HIGH SENSITIVITY
Troponin T High Sensitivity: 118 ng/L (ref 0–19)
Troponin T High Sensitivity: 124 ng/L (ref 0–19)
Troponin T High Sensitivity: 110 ng/L (ref 0–19)

## 2024-11-18 LAB — CBC
HCT: 40.2 % (ref 39.0–52.0)
HCT: 40.2 % (ref 39.0–52.0)
Hemoglobin: 12.4 g/dL — ABNORMAL LOW (ref 13.0–17.0)
Hemoglobin: 12.5 g/dL — ABNORMAL LOW (ref 13.0–17.0)
MCH: 27.6 pg (ref 26.0–34.0)
MCH: 27.7 pg (ref 26.0–34.0)
MCHC: 30.8 g/dL (ref 30.0–36.0)
MCHC: 31.1 g/dL (ref 30.0–36.0)
MCV: 89.3 fL (ref 80.0–100.0)
MCV: 89.1 fL (ref 80.0–100.0)
Platelets: 127 K/uL — ABNORMAL LOW (ref 150–400)
Platelets: 133 K/uL — ABNORMAL LOW (ref 150–400)
RBC: 4.5 MIL/uL (ref 4.22–5.81)
RBC: 4.51 MIL/uL (ref 4.22–5.81)
RDW: 16.7 % — ABNORMAL HIGH (ref 11.5–15.5)
RDW: 16.8 % — ABNORMAL HIGH (ref 11.5–15.5)
WBC: 4.2 K/uL (ref 4.0–10.5)
WBC: 4.7 K/uL (ref 4.0–10.5)
nRBC: 0 % (ref 0.0–0.2)
nRBC: 0 % (ref 0.0–0.2)

## 2024-11-18 LAB — BASIC METABOLIC PANEL WITH GFR
Anion gap: 9 (ref 5–15)
BUN: 56 mg/dL — ABNORMAL HIGH (ref 8–23)
CO2: 32 mmol/L (ref 22–32)
Calcium: 8.2 mg/dL — ABNORMAL LOW (ref 8.9–10.3)
Chloride: 102 mmol/L (ref 98–111)
Creatinine, Ser: 2.17 mg/dL — ABNORMAL HIGH (ref 0.61–1.24)
GFR, Estimated: 29 mL/min — ABNORMAL LOW
Glucose, Bld: 111 mg/dL — ABNORMAL HIGH (ref 70–99)
Potassium: 3.7 mmol/L (ref 3.5–5.1)
Sodium: 143 mmol/L (ref 135–145)

## 2024-11-18 LAB — CBG MONITORING, ED
Glucose-Capillary: 112 mg/dL — ABNORMAL HIGH (ref 70–99)
Glucose-Capillary: 150 mg/dL — ABNORMAL HIGH (ref 70–99)

## 2024-11-18 LAB — GLUCOSE, CAPILLARY: Glucose-Capillary: 158 mg/dL — ABNORMAL HIGH (ref 70–99)

## 2024-11-18 MED ORDER — AMIODARONE HCL 200 MG PO TABS
200.0000 mg | ORAL_TABLET | Freq: Two times a day (BID) | ORAL | Status: DC
  Administered 2024-11-18 – 2024-11-23 (×11): 200 mg via ORAL
  Filled 2024-11-18 (×11): qty 1

## 2024-11-18 MED ORDER — POTASSIUM CHLORIDE CRYS ER 20 MEQ PO TBCR
40.0000 meq | EXTENDED_RELEASE_TABLET | Freq: Once | ORAL | Status: AC
  Administered 2024-11-18: 40 meq via ORAL
  Filled 2024-11-18: qty 2

## 2024-11-18 NOTE — Evaluation (Signed)
 Physical Therapy Evaluation Patient Details Name: [name] MRN: [id number] DOB: 03-06-1935 Today's Date: 11/18/2024  History of Present Illness  89 y/o male presented to ED on 11/16/24 from heart failure clinic for B LE swelling and SOB. Admitted for acute on chronic systolic CHF and hypokalemia. PMH: PAF on Eliquis , CAD s/p CAD status post CABG x5 and multiple stents, ischemic cardiomyopathy (EF 25-30% 12/2023), pacemaker, CHF, back sx, and CKD, HTN, T2DM  Clinical Impression  Patient admitted with the above. PTA, patient lives with wife and daughter and was ambulatory with RW. Patient currently presents with weakness, impaired balance, decreased activity tolerance, impaired functional mobility, and pain. Complaining of R knee pain and swelling throughout session. Completed bed mobility with CGA and stood from elevated stretcher with minA and RW. Unable to take more than 3-4 very small steps due to R knee pain and difficulty weightshifting to R and advancing R LE. VSS on 2L O2 when good pleth reading. Patient will benefit from skilled PT services during acute stay to address listed deficits. Patient will benefit from ongoing therapy at discharge to maximize functional independence and safety.       If plan is discharge home, recommend the following: A lot of help with walking and/or transfers;A lot of help with bathing/dressing/bathroom;Assistance with cooking/housework;Direct supervision/assist for medications management;Direct supervision/assist for financial management;Assist for transportation;Help with stairs or ramp for entrance   Can travel by private vehicle   No    Equipment Recommendations Other (comment) (TBD)  Recommendations for Other Services       Functional Status Assessment Patient has had a recent decline in their functional status and demonstrates the ability to make significant improvements in function in a reasonable and predictable amount of time.     Precautions /  Restrictions Precautions Precautions: Fall Recall of Precautions/Restrictions: Intact Restrictions Weight Bearing Restrictions Per Provider Order: No      Mobility  Bed Mobility Overal bed mobility: Needs Assistance Bed Mobility: Supine to Sit, Sit to Supine     Supine to sit: Contact guard Sit to supine: Contact guard assist        Transfers Overall transfer level: Needs assistance Equipment used: Rolling Cherrell Maybee (2 wheels) Transfers: Sit to/from Stand Sit to Stand: Min assist           General transfer comment: assist to steady    Ambulation/Gait Ambulation/Gait assistance: Min assist Gait Distance (Feet): 3 Feet Assistive device: Rolling Koral Thaden (2 wheels) Gait Pattern/deviations: Step-to pattern Gait velocity: decreased     General Gait Details: difficulty advancing R LE and weight shifting onto R side due to pain. Assist for RW management and weightshifting to be able to advance LLE for sidestepping  Stairs            Wheelchair Mobility     Tilt Bed    Modified Rankin (Stroke Patients Only)       Balance Overall balance assessment: Needs assistance Sitting-balance support: Feet supported, No upper extremity supported Sitting balance-[name]: Fair     Standing balance support: Bilateral upper extremity supported, During functional activity Standing balance-[name]: Poor                               Pertinent Vitals/Pain Pain Assessment Pain Assessment: Faces Faces Pain Scale: Hurts even more Pain Location: R knee Pain Descriptors / Indicators: Discomfort, Grimacing Pain Intervention(s): Limited activity within patient's tolerance, Monitored during session, Repositioned  Home Living Family/patient expects to be discharged to:: Private residence Living Arrangements: Spouse/significant other;Children Available Help at Discharge: Family;Available 24 hours/day Type of Home: House Home Access: Ramped entrance        Home Layout: One level Home Equipment: Cane - single Librarian, Academic (2 wheels);Shower seat;Grab bars - toilet;Grab bars - tub/shower      Prior Function Prior Level of Function : Independent/Modified Independent             Mobility Comments: ambulatory with RW/SPC ADLs Comments: PRN assistance for bathing     Extremity/Trunk Assessment   Upper Extremity Assessment Upper Extremity Assessment: Defer to OT evaluation    Lower Extremity Assessment Lower Extremity Assessment: Generalized weakness    Cervical / Trunk Assessment Cervical / Trunk Assessment: Kyphotic  Communication   Communication Communication: Impaired Factors Affecting Communication: Reduced clarity of speech    Cognition Arousal: Alert Behavior During Therapy: WFL for tasks assessed/performed   PT - Cognitive impairments: No family/caregiver present to determine baseline                       PT - Cognition Comments: difficult to understand at times due to mumbling. Unable to state how many liters he wears at home. Following commands: Impaired Following commands impaired: Follows one step commands with increased time     Cueing Cueing Techniques: Verbal cues, Tactile cues     General Comments General comments (skin integrity, edema, etc.): VSS on 2L O2-  difficulty obtaining good pleth at times    Exercises     Assessment/Plan    PT Assessment Patient needs continued PT services  PT Problem List Decreased strength;Decreased balance;Decreased activity tolerance;Decreased mobility;Decreased knowledge of use of DME;Decreased safety awareness;Decreased knowledge of precautions;Cardiopulmonary status limiting activity;Pain       PT Treatment Interventions DME instruction;Gait training;Functional mobility training;Therapeutic activities;Therapeutic exercise;Balance training;Neuromuscular re-education;Patient/family education    PT Goals (Current goals can be found in the Care  Plan section)  Acute Rehab PT Goals Patient Stated Goal: reduce pain PT Goal Formulation: With patient Time For Goal Achievement: 12/02/24 Potential to Achieve Goals: Fair    Frequency Min 2X/week     Co-evaluation PT/OT/SLP Co-Evaluation/Treatment: Yes Reason for Co-Treatment: For patient/therapist safety;To address functional/ADL transfers PT goals addressed during session: Mobility/safety with mobility;Balance         AM-PAC PT 6 Clicks Mobility  Outcome Measure Help needed turning from your back to your side while in a flat bed without using bedrails?: A Little Help needed moving from lying on your back to sitting on the side of a flat bed without using bedrails?: A Little Help needed moving to and from a bed to a chair (including a wheelchair)?: A Little Help needed standing up from a chair using your arms (e.g., wheelchair or bedside chair)?: A Little Help needed to walk in hospital room?: A Lot Help needed climbing 3-5 steps with a railing? : A Lot 6 Click Score: 16    End of Session Equipment Utilized During Treatment: Oxygen Activity Tolerance: Patient limited by fatigue;Patient limited by pain Patient left: in bed;with call bell/phone within reach Nurse Communication: Mobility status PT Visit Diagnosis: Unsteadiness on feet (R26.81);Other abnormalities of gait and mobility (R26.89);Muscle weakness (generalized) (M62.81);Difficulty in walking, not elsewhere classified (R26.2)    Time: 8595-8578 PT Time Calculation (min) (ACUTE ONLY): 17 min   Charges:   PT Evaluation $PT Eval Moderate Complexity: 1 Mod   PT General Charges $$ ACUTE PT VISIT:  1 Visit         Maryanne Finder, PT, DPT Physical Therapist - [facility]  [facility]   Raahi Korber A Mao Lockner 11/18/2024, 3:17 PM

## 2024-11-18 NOTE — Evaluation (Signed)
 Occupational Therapy Evaluation Patient Details Name: [name] MRN: [id number] DOB: 26-Dec-1934 Today's Date: 11/18/2024   History of Present Illness   89 y/o male presented to ED on 11/16/24 from heart failure clinic for B LE swelling and SOB. Admitted for acute on chronic systolic CHF and hypokalemia. PMH: PAF on Eliquis , CAD s/p CAD status post CABG x5 and multiple stents, ischemic cardiomyopathy (EF 25-30% 12/2023), pacemaker, CHF, back sx, and CKD, HTN, T2DM     Clinical Impressions Patient was seen for OT evaluation this date. Prior to hospital admission, patient was ambulating with rolling walker, occasionally receives A for ADLs but primarily independent; lives with wife/daughter. Patient primarily limited during tx by R knee pain/swelling, poor activity tolerance and gross strength deficits. Patient required mod a for UB dressing due to gross weakness. Patient performed bed mobility and sit<>stand with min A x 2; able to minimal side steps (limited due to pain) RN notified of pain. Patient is currently requiring mod A for UB tasks and max A for LB tasks.  Patient would benefit from skilled OT services to address noted impairments and functional limitations (see below for any additional details) in order to maximize safety and independence while minimizing future risk of falls, injury, and readmission. Anticipate the need for follow up OT services upon acute hospital DC.      If plan is discharge home, recommend the following:   A lot of help with walking and/or transfers;A lot of help with bathing/dressing/bathroom;Help with stairs or ramp for entrance     Functional Status Assessment   Patient has had a recent decline in their functional status and demonstrates the ability to make significant improvements in function in a reasonable and predictable amount of time.     Equipment Recommendations   None recommended by OT     Recommendations for Other Services          Precautions/Restrictions   Precautions Precautions: Fall Recall of Precautions/Restrictions: Intact Restrictions Weight Bearing Restrictions Per Provider Order: No     Mobility Bed Mobility Overal bed mobility: Needs Assistance Bed Mobility: Supine to Sit, Sit to Supine     Supine to sit: Contact guard Sit to supine: Contact guard assist        Transfers Overall transfer level: Needs assistance Equipment used: Rolling walker (2 wheels) Transfers: Sit to/from Stand Sit to Stand: Min assist           General transfer comment: assist to steady      Balance Overall balance assessment: Needs assistance Sitting-balance support: Feet supported, No upper extremity supported Sitting balance-[name]: Fair     Standing balance support: Bilateral upper extremity supported, During functional activity Standing balance-[name]: Poor                             ADL either performed or assessed with clinical judgement   ADL Overall ADL's : Needs assistance/impaired                 Upper Body Dressing : Moderate assistance                   Functional mobility during ADLs: Minimal assistance;Rolling walker (2 wheels) General ADL Comments: anticipating that patient will require max A for LB self care tasks including toileting for steadying A; min-mod A for UB self care tasks due to poor activity tolerance     Vision  Perception         Praxis         Pertinent Vitals/Pain Pain Assessment Pain Assessment: Faces Faces Pain Scale: Hurts even more Pain Location: R knee Pain Descriptors / Indicators: Discomfort, Grimacing Pain Intervention(s): Limited activity within patient's tolerance, Monitored during session, Patient requesting pain meds-RN notified     Extremity/Trunk Assessment Upper Extremity Assessment Upper Extremity Assessment: Generalized weakness   Lower Extremity Assessment Lower Extremity Assessment:  Generalized weakness   Cervical / Trunk Assessment Cervical / Trunk Assessment: Kyphotic   Communication Communication Communication: Impaired Factors Affecting Communication: Reduced clarity of speech   Cognition Arousal: Alert Behavior During Therapy: WFL for tasks assessed/performed Cognition: Difficult to assess Difficult to assess due to: Impaired communication           OT - Cognition Comments: difficulty expressing self/reduced clarity of speech                 Following commands: Impaired Following commands impaired: Follows one step commands with increased time     Cueing  General Comments   Cueing Techniques: Verbal cues;Tactile cues  VSS on 2L O2-  difficulty obtaining good pleth at times   Exercises     Shoulder Instructions      Home Living Family/patient expects to be discharged to:: Private residence Living Arrangements: Spouse/significant other;Children Available Help at Discharge: Family;Available 24 hours/day Type of Home: House Home Access: Ramped entrance     Home Layout: One level     Bathroom Shower/Tub: Producer, Television/film/video: Handicapped height Bathroom Accessibility: Yes How Accessible: Accessible via walker Home Equipment: Cane - single point;Rolling Walker (2 wheels);Shower seat;Grab bars - toilet;Grab bars - tub/shower          Prior Functioning/Environment Prior Level of Function : Independent/Modified Independent             Mobility Comments: ambulatory with RW/SPC ADLs Comments: PRN assistance for bathing    OT Problem List: Decreased strength;Decreased activity tolerance;Impaired balance (sitting and/or standing)   OT Treatment/Interventions: Self-care/ADL training;Therapeutic exercise;Energy conservation;DME and/or AE instruction;Balance training;Patient/family education      OT Goals(Current goals can be found in the care plan section)   Acute Rehab OT Goals Patient Stated Goal: to go  home OT Goal Formulation: With patient Time For Goal Achievement: 12/02/24 Potential to Achieve Goals: Fair ADL Goals Pt Will Perform Grooming: standing;with contact guard assist Pt Will Perform Lower Body Dressing: with min assist;sit to/from stand Pt Will Transfer to Toilet: with contact guard assist;ambulating;bedside commode Pt Will Perform Toileting - Clothing Manipulation and hygiene: with min assist;sit to/from stand   OT Frequency:  Min 2X/week    Co-evaluation PT/OT/SLP Co-Evaluation/Treatment: Yes Reason for Co-Treatment: For patient/therapist safety;To address functional/ADL transfers PT goals addressed during session: Mobility/safety with mobility;Balance OT goals addressed during session: ADL's and self-care      AM-PAC OT 6 Clicks Daily Activity     Outcome Measure Help from another person eating meals?: None Help from another person taking care of personal grooming?: A Little Help from another person toileting, which includes using toliet, bedpan, or urinal?: A Lot Help from another person bathing (including washing, rinsing, drying)?: A Lot Help from another person to put on and taking off regular upper body clothing?: A Little Help from another person to put on and taking off regular lower body clothing?: A Lot 6 Click Score: 16   End of Session Equipment Utilized During Treatment: Rolling walker (2 wheels);Oxygen Nurse Communication:  Patient requests pain meds  Activity Tolerance: Patient limited by pain Patient left: in bed;with call bell/phone within reach  OT Visit Diagnosis: Unsteadiness on feet (R26.81);Other abnormalities of gait and mobility (R26.89);Muscle weakness (generalized) (M62.81)                Time: 8395-8377 OT Time Calculation (min): 18 min Charges:  OT General Charges $OT Visit: 1 Visit OT Evaluation $OT Eval Low Complexity: 1 Low  Rogers Clause, OT/L MSOT, 11/18/2024

## 2024-11-18 NOTE — Progress Notes (Signed)
 " PROGRESS NOTE    [name]  FMW:[id number] DOB: 03-Jul-1935 DOA: 11/16/2024 PCP: [name], MD  Assessment & Plan:   Principal Problem:   Acute on chronic systolic (congestive) heart failure (HCC) Active Problems:   CAD, s/p CABG x 5   Type 2 diabetes mellitus with hyperlipidemia (HCC)   HTN (hypertension)   HLD (hyperlipidemia)   Paroxysmal atrial fibrillation (HCC)   GERD without esophagitis   CKD (chronic kidney disease) stage 4, GFR 15-29 ml/min (HCC)   Thrombocytopenia   Obesity (BMI 30-39.9)   History of sustained ventricular fibrillation   History of cardiac arrest   S/P CABG x 5   Complete heart block s/p pacemaker [facility])   Cardiac resynchronization therapy pacemaker (CRT-P) in place   Bradycardia   History of prostate cancer  Assessment and Plan:  Acute on chronic systolic CHF: continue on IV 120mg  BID. Monitor I/Os. Fluid restriction. Last echo  October with EF 25-30%, indeterminate diastolic function, mildly reduced RV function, large pericardial effusion.  Repeat echocardiogram at [facility] on 12/26 showed EF reduced to 20%. Continue on coreg , entresto , aldactone  as per cardio. Cardio following and recs apprec   Hypokalemia: WNL today    HTN: continue on coreg , aldactone , entresto , lasix   HLD: continue on statin    GERD: continue on PPI   DM2: fair control, HbA1c 7.3 in 06/2024. Continue on SSI w/ accuchecks    Hx of CAD: s/p CABG. Continue on coreg , entresto , aldactone , statin    PAF: continue on coreg , eliquis     Hx of V-fib: as well as complete heart block. S/p pacemaker    CKDIV: Cr is trending down from day prior. Will continue to monitor    Obesity: BMI 36.4. Would benefit from weight loss    Hx of prostate cancer: management per [facility] oncology    Gout: not taking any meds for this currently    Thrombocytopenia: labile. Will continue to monitor      DVT prophylaxis: eliquis  Code Status: full  Family Communication: called pt's wife,  [name], and answered her questions  Disposition Plan: depends on PT/OT recs   Level of care: Progressive  Status is: Inpatient Remains inpatient appropriate because: severity of illness    Consultants:  Cardio   Procedures:   Antimicrobials:    Subjective: Pt c/of fatigue   Objective: Vitals:   11/18/24 0800 11/18/24 0900 11/18/24 1000 11/18/24 1220  BP: 112/71 125/83 136/75 138/88  Pulse:    70  Resp: 18 16 (!) 23 11  Temp:   97.9 F (36.6 C) 98.4 F (36.9 C)  TempSrc:   Oral Oral  SpO2:   94% 95%  Weight:      Height:        Intake/Output Summary (Last 24 hours) at 11/18/2024 1454 Last data filed at 11/18/2024 1220 Gross per 24 hour  Intake 50 ml  Output 1700 ml  Net -1650 ml   Filed Weights   11/16/24 1516  Weight: 108.9 kg    Examination:  General exam: Appears comfortable  Respiratory system: course breath sounds b/l  Cardiovascular system: S1/S2+. No rubs or clicks. 2+ pitting edema b/l LE  Gastrointestinal system: abd is soft, NT, obese & hypoactive bowel sounds  Central nervous system: alert & oriented. Moves all extremities  Psychiatry: judgement and insight appears at baseline. Flat mood and affect     Data Reviewed: I have personally reviewed following labs and imaging studies  CBC: Recent Labs  Lab 11/16/24 1519  11/17/24 0108 11/17/24 1448 11/18/24 0230  WBC 3.1* 2.2* 4.2 4.7  HGB 12.4* 9.1* 12.4* 12.5*  HCT 39.9 30.1* 40.2 40.2  MCV 89.1 91.8 89.3 89.1  PLT 101* 79* 127* 133*   Basic Metabolic Panel: Recent Labs  Lab 11/16/24 1519 11/16/24 1718 11/17/24 0108 11/18/24 0230  NA 142  --  144 143  K 3.3*  --  3.6 3.7  CL 100  --  102 102  CO2 29  --  31 32  GLUCOSE 129*  --  165* 111*  BUN 57*  --  58* 56*  CREATININE 2.29*  --  2.42* 2.17*  CALCIUM  8.2*  --  7.8* 8.2*  MG  --  1.7  --   --    GFR: Estimated Creatinine Clearance: 28.7 mL/min (A) (by C-G formula based on SCr of 2.17 mg/dL (H)). Liver Function  Tests: Recent Labs  Lab 11/17/24 0108  AST 23  ALT 15  ALKPHOS 105  BILITOT 0.4  PROT 6.7  ALBUMIN  3.4*   No results for input(s): LIPASE, AMYLASE in the last 168 hours. No results for input(s): AMMONIA in the last 168 hours. Coagulation Profile: No results for input(s): INR, PROTIME in the last 168 hours. Cardiac Enzymes: No results for input(s): CKTOTAL, CKMB, CKMBINDEX, TROPONINI in the last 168 hours. BNP (last 3 results) Recent Labs    10/10/24 1058 10/15/24 1935 11/16/24 1519  PROBNP 24,507.0* >35,000.0* >35,000.0*   HbA1C: No results for input(s): HGBA1C in the last 72 hours. CBG: Recent Labs  Lab 11/17/24 1147 11/17/24 1713 11/17/24 2248 11/18/24 0757 11/18/24 1135  GLUCAP 116* 151* 183* 112* 150*   Lipid Profile: No results for input(s): CHOL, HDL, LDLCALC, TRIG, CHOLHDL, LDLDIRECT in the last 72 hours. Thyroid Function Tests: No results for input(s): TSH, T4TOTAL, FREET4, T3FREE, THYROIDAB in the last 72 hours. Anemia Panel: No results for input(s): VITAMINB12, FOLATE, FERRITIN, TIBC, IRON, RETICCTPCT in the last 72 hours. Sepsis Labs: No results for input(s): PROCALCITON, LATICACIDVEN in the last 168 hours.  No results found for this or any previous visit (from the past 240 hours).       Radiology Studies: DG Chest 2 View Result Date: 11/16/2024 CLINICAL DATA:  Shortness of breath EXAM: CHEST - 2 VIEW COMPARISON:  October 15, 2024 FINDINGS: Stable cardiomegaly. Status post coronary artery bypass graft. Left-sided pacemaker is unchanged. Minimal left basilar atelectasis is noted with small left pleural effusion. Minimal right basilar subsegmental atelectasis. Bony thorax is unremarkable. IMPRESSION: Minimal bibasilar subsegmental atelectasis with small left pleural effusion. Electronically Signed   By: Lynwood Landy Raddle M.D.   On: 11/16/2024 16:13        Scheduled Meds:  amiodarone   200 mg  Oral BID   apixaban   2.5 mg Oral BID   carvedilol   6.25 mg Oral BID WC   guaiFENesin   600 mg Oral BID   insulin  aspart  0-15 Units Subcutaneous TID WC   insulin  aspart  0-5 Units Subcutaneous QHS   ipratropium-albuterol   3 mL Nebulization QID   pantoprazole   40 mg Oral BID   rosuvastatin   20 mg Oral Daily   sacubitril -valsartan   1 tablet Oral BID   sodium chloride  flush  3 mL Intravenous Q12H   spironolactone   25 mg Oral Daily   Continuous Infusions:  furosemide  Stopped (11/18/24 0911)     LOS: 2 days     Anthony CHRISTELLA Pouch, MD [facility] Pager [phone]  If 7PM-7AM, please contact night-coverage [email or web address] 11/18/2024,  2:54 PM   "

## 2024-11-18 NOTE — ED Notes (Signed)
 Assumed care of pt from [facility]. Introduced self to pt. Pt has no needs or requests at this time. Bed in lowest position, locked, and call bell in reach.

## 2024-11-18 NOTE — Progress Notes (Signed)
 "  Rounding Note   Patient Name: [name] Date of Encounter: 11/18/2024  [facility] Cardiologist: Deatrice Cage, MD   Subjective Patient reports good UOP, although poorly recorded in chart. Renal function is stable on IV Lasix . Noted to be in rate controlled atrial flutter on telemetry since 0545 this morning.   Scheduled Meds:  apixaban   2.5 mg Oral BID   carvedilol   6.25 mg Oral BID WC   guaiFENesin   600 mg Oral BID   insulin  aspart  0-15 Units Subcutaneous TID WC   insulin  aspart  0-5 Units Subcutaneous QHS   ipratropium-albuterol   3 mL Nebulization QID   pantoprazole   40 mg Oral BID   rosuvastatin   20 mg Oral Daily   sacubitril -valsartan   1 tablet Oral BID   sodium chloride  flush  3 mL Intravenous Q12H   spironolactone   25 mg Oral Daily   Continuous Infusions:  furosemide  Stopped (11/17/24 1904)   PRN Meds: acetaminophen  **OR** acetaminophen , chlorpheniramine-HYDROcodone , polyethylene glycol   Vital Signs  Vitals:   11/18/24 0200 11/18/24 0445 11/18/24 0500 11/18/24 0600  BP: 105/70 118/68 124/62 129/75  Pulse: 69   71  Resp: 20 19 11 13   Temp: 98.5 F (36.9 C)  98.4 F (36.9 C)   TempSrc: Oral  Oral   SpO2: 96%   95%  Weight:      Height:       No intake or output data in the 24 hours ending 11/18/24 0751    11/16/2024    3:16 PM 10/23/2024    5:00 AM 10/22/2024    5:00 AM  Last 3 Weights  Weight (lbs) 240 lb 232 lb 9.4 oz 232 lb 12.9 oz  Weight (kg) 108.863 kg 105.5 kg 105.6 kg      Telemetry Sinus rhythm with conversion to atrial flutter ~ 0545 on 1/11 with PVCs and NSVT vs aberrancy up to 13 beats - Personally Reviewed  Physical Exam  GEN: No acute distress.   Neck: Difficult to assess due to body habitus Cardiac: RRR, no murmurs, rubs, or gallops.  Respiratory: Coarse breath sounds GI: Distended, although improved from yesterday MS: 2+ bilateral LE edema, L greater than R; No deformity. Neuro:  Nonfocal  Psych: Normal affect    Labs High Sensitivity Troponin:  No results for input(s): TROPONINIHS in the last 720 hours.  Recent Labs  Lab 11/16/24 1519 11/16/24 1718 11/17/24 0108 11/17/24 0338 11/18/24 0230  TRNPT 109* 105* 108* 118* 124*       Chemistry Recent Labs  Lab 11/16/24 1519 11/16/24 1718 11/17/24 0108 11/18/24 0230  NA 142  --  144 143  K 3.3*  --  3.6 3.7  CL 100  --  102 102  CO2 29  --  31 32  GLUCOSE 129*  --  165* 111*  BUN 57*  --  58* 56*  CREATININE 2.29*  --  2.42* 2.17*  CALCIUM  8.2*  --  7.8* 8.2*  MG  --  1.7  --   --   PROT  --   --  6.7  --   ALBUMIN   --   --  3.4*  --   AST  --   --  23  --   ALT  --   --  15  --   ALKPHOS  --   --  105  --   BILITOT  --   --  0.4  --   GFRNONAA 27*  --  25*  29*  ANIONGAP 12  --  11 9    Lipids No results for input(s): CHOL, TRIG, HDL, LABVLDL, LDLCALC, CHOLHDL in the last 168 hours.  Hematology Recent Labs  Lab 11/17/24 0108 11/17/24 1448 11/18/24 0230  WBC 2.2* 4.2 4.7  RBC 3.28* 4.50 4.51  HGB 9.1* 12.4* 12.5*  HCT 30.1* 40.2 40.2  MCV 91.8 89.3 89.1  MCH 27.7 27.6 27.7  MCHC 30.2 30.8 31.1  RDW 16.8* 16.7* 16.8*  PLT 79* 127* 133*   Thyroid No results for input(s): TSH, FREET4 in the last 168 hours.  BNP Recent Labs  Lab 11/16/24 1519  PROBNP >35,000.0*    DDimer No results for input(s): DDIMER in the last 168 hours.   Radiology  DG Chest 2 View Result Date: 11/16/2024 CLINICAL DATA:  Shortness of breath EXAM: CHEST - 2 VIEW COMPARISON:  October 15, 2024 FINDINGS: Stable cardiomegaly. Status post coronary artery bypass graft. Left-sided pacemaker is unchanged. Minimal left basilar atelectasis is noted with small left pleural effusion. Minimal right basilar subsegmental atelectasis. Bony thorax is unremarkable. IMPRESSION: Minimal bibasilar subsegmental atelectasis with small left pleural effusion. Electronically Signed   By: Lynwood Landy Raddle M.D.   On: 11/16/2024 16:13    Cardiac  Studies  11/02/2024 2D echo [facility]) 1. The left ventricle is mildly dilated in size with mildly increased wall  thickness.   2. The left ventricular systolic function is severely decreased, LVEF is  visually estimated at 20%.    3. Calculated LVOT SV: 51 ml.  CO: 3.6 L/min.  CI: 1.6 L/min/m2.    4. The left atrium is moderately dilated in size.    5. The aortic valve is probably trileaflet with moderately thickened  leaflets with normal excursion.    6. The right ventricle is moderately dilated in size, with severely reduced  systolic function.    7. There is mild pulmonary hypertension.    8. TR maximum velocity: 2.9 m/s  Estimated PASP: 49 mmHg.    9. IVC size and inspiratory change suggest elevated right atrial pressure.  (10-20 mmHg).    10. There is a left pleural effusion present.   Patient Profile   89 y.o. 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   Assessment & Plan   Acute on chronic HFrEF - Most recent echo 10/2024 at [facility] with EF 20% - Presenting with worsening dyspnea, lower extremity swelling, and abdominal distention - Appears grossly volume overloaded on exam - proBNP greater than 35,000 - Received IV Lasix  80 mg x 1 in the ED and started on IV Lasix  120 mg twice daily with metolazone  given yesterday afternoon - Volume status appears to be improving with improving/stable renal function - Continue to monitor kidney function, strict I/Os, and daily weights with ongoing diuresis - Continue carvedilol  6.25 mg twice daily, spironolactone  25 mg daily, and Entresto  at 24-26 mg twice daily - Consider resuming Farxiga     Hypokalemia - K 3.3 on admission, now improved to 3.7 - Continue to replete for K >  4   Paroxysmal atrial fibrillation - Most recent device check 10/2024 with 40% AF burden - Possibly contributing to worsening CHF - In rate controlled atrial flutter this morning on telemetry -  Could consider addition of PO amiodarone  - Continue carvedilol  and Eliquis  2.5 mg twice daily (age and renal function)   Coronary artery disease Hyperlipidemia - Most recent ischemic evaluation 05/2023 with LHC revealing patent grafts and stable moderate stenosis in the proximal SVG to right PDA - Troponin minimally elevated and flat trending, not consistent with ACS - No acute ischemic changes on EKG - Denies chest pain - Continue carvedilol , rosuvastatin , and Eliquis  in place of aspirin    Hypertension - BP well controlled   Cardiac device in situ - Most recent device check 10/23/2024, normal functioning device   CKD - Renal function appears stable - Continue to monitor with ongoing diuresis  For questions or updates, please contact [facility] Please consult [email or web address] for contact info under       Signed, Lesley LITTIE Maffucci, PA-C  11/18/2024, 7:51 AM    "

## 2024-11-19 DIAGNOSIS — I5023 Acute on chronic systolic (congestive) heart failure: Secondary | ICD-10-CM | POA: Diagnosis not present

## 2024-11-19 LAB — CBC
HCT: 41.2 % (ref 39.0–52.0)
Hemoglobin: 12.8 g/dL — ABNORMAL LOW (ref 13.0–17.0)
MCH: 27.5 pg (ref 26.0–34.0)
MCHC: 31.1 g/dL (ref 30.0–36.0)
MCV: 88.6 fL (ref 80.0–100.0)
Platelets: 153 K/uL (ref 150–400)
RBC: 4.65 MIL/uL (ref 4.22–5.81)
RDW: 17 % — ABNORMAL HIGH (ref 11.5–15.5)
WBC: 4.2 K/uL (ref 4.0–10.5)
nRBC: 0 % (ref 0.0–0.2)

## 2024-11-19 LAB — BASIC METABOLIC PANEL WITH GFR
Anion gap: 10 (ref 5–15)
BUN: 52 mg/dL — ABNORMAL HIGH (ref 8–23)
CO2: 33 mmol/L — ABNORMAL HIGH (ref 22–32)
Calcium: 8.1 mg/dL — ABNORMAL LOW (ref 8.9–10.3)
Chloride: 100 mmol/L (ref 98–111)
Creatinine, Ser: 2.16 mg/dL — ABNORMAL HIGH (ref 0.61–1.24)
GFR, Estimated: 29 mL/min — ABNORMAL LOW
Glucose, Bld: 101 mg/dL — ABNORMAL HIGH (ref 70–99)
Potassium: 3.9 mmol/L (ref 3.5–5.1)
Sodium: 143 mmol/L (ref 135–145)

## 2024-11-19 LAB — MAGNESIUM: Magnesium: 1.7 mg/dL (ref 1.7–2.4)

## 2024-11-19 LAB — GLUCOSE, CAPILLARY
Glucose-Capillary: 92 mg/dL (ref 70–99)
Glucose-Capillary: 203 mg/dL — ABNORMAL HIGH (ref 70–99)
Glucose-Capillary: 91 mg/dL (ref 70–99)

## 2024-11-19 MED ORDER — MAGNESIUM SULFATE 2 GM/50ML IV SOLN
2.0000 g | Freq: Once | INTRAVENOUS | Status: DC
  Filled 2024-11-19: qty 50

## 2024-11-19 MED ORDER — IPRATROPIUM-ALBUTEROL 0.5-2.5 (3) MG/3ML IN SOLN
3.0000 mL | Freq: Four times a day (QID) | RESPIRATORY_TRACT | Status: DC | PRN
  Administered 2024-11-19: 3 mL via RESPIRATORY_TRACT
  Filled 2024-11-19: qty 3

## 2024-11-19 MED ORDER — MORPHINE SULFATE (PF) 2 MG/ML IV SOLN
1.0000 mg | INTRAVENOUS | Status: DC | PRN
  Administered 2024-11-19: 1 mg via INTRAVENOUS
  Filled 2024-11-19: qty 1

## 2024-11-19 MED ORDER — METOLAZONE 5 MG PO TABS
5.0000 mg | ORAL_TABLET | Freq: Once | ORAL | Status: AC
  Administered 2024-11-19: 5 mg via ORAL
  Filled 2024-11-19: qty 1

## 2024-11-19 MED ORDER — OXYCODONE-ACETAMINOPHEN 5-325 MG PO TABS
1.0000 | ORAL_TABLET | Freq: Four times a day (QID) | ORAL | Status: DC | PRN
  Administered 2024-11-19 – 2024-11-22 (×6): 1 via ORAL
  Filled 2024-11-19 (×6): qty 1
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
## 2024-11-19 NOTE — Progress Notes (Signed)
 "  Progress Note  Patient Name: [name] Date of Encounter: 11/19/2024 [facility] Cardiologist: Deatrice Cage, MD   Interval Summary    UOP -2.3L.kidney function stable. Patient reports he has not been feeling that well. No chest pain reported. Has some SOB.   Vital Signs Vitals:   11/19/24 0038 11/19/24 0519 11/19/24 0858 11/19/24 0901  BP: 123/76 130/80  121/76  Pulse: 70 88  (!) 50  Resp: 18 18  18   Temp: 98 F (36.7 C) 98.3 F (36.8 C)  98.5 F (36.9 C)  TempSrc:      SpO2: 100% 100% 99% 100%  Weight:  66.1 kg    Height:        Intake/Output Summary (Last 24 hours) at 11/19/2024 1340 Last data filed at 11/19/2024 0400 Gross per 24 hour  Intake 650 ml  Output 1350 ml  Net -700 ml      11/19/2024    5:19 AM 11/16/2024    3:16 PM 10/23/2024    5:00 AM  Last 3 Weights  Weight (lbs) 145 lb 11.6 oz 240 lb 232 lb 9.4 oz  Weight (kg) 66.1 kg 108.863 kg 105.5 kg      Telemetry/ECG  AV pacing, PVCs - Personally Reviewed  Physical Exam  GEN: No acute distress.   Neck: + JVD Cardiac: RRR, no murmurs, rubs, or gallops.  Respiratory: crackles and rhonchii GI: Soft, nontender, non-distended  MS: No edema  Cardiac Studies   11/02/2024 2D echo [facility]) 1. The left ventricle is mildly dilated in size with mildly increased wall  thickness.   2. The left ventricular systolic function is severely decreased, LVEF is  visually estimated at 20%.    3. Calculated LVOT SV: 51 ml.  CO: 3.6 L/min.  CI: 1.6 L/min/m2.    4. The left atrium is moderately dilated in size.    5. The aortic valve is probably trileaflet with moderately thickened  leaflets with normal excursion.    6. The right ventricle is moderately dilated in size, with severely reduced  systolic function.    7. There is mild pulmonary hypertension.    8. TR maximum velocity: 2.9 m/s  Estimated PASP: 49 mmHg.    9. IVC size and inspiratory change suggest elevated right atrial pressure.  (10-20 mmHg).     10. There is a left pleural effusion present.    Patient Profile   89 y.o. 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     Assessment & Plan   Acute on chronic HFrEF - presenting with worsening dyspnea, lower extremity swelling, abdominal distention - most recent echo 10/2024 at [facility] showed LVEF 20% - IV lasix  120mg  BID with metolazone  - UOP net -2.5L - spironolactone  25mg  daily, Entresto  24-26mg BID, Coreg  6.25mg BID - still volume up on exam - will given metolazone  again today  Hypokalemia - K 3.3>>3.7  Paroxysmal Afib - most recent device check showed 40% AF burden - possibly contributing to worsening CHF - tele shows AV pacing - continue coreg  - Eliquis  2.5mg  BID - started on amiodarone  200mg  BID  CAD - most recent ischemic evaluation 05/2023 with LHC showing patent grafts and stable moderate stenosis in the proximal SVG to right PDA -  HS trop minimally elevated with flat trend, not consistent with ACS - EKG without ischemic changes - continue crestor  and Coreg  - no ASA given Eliquis   HTN - BP well controlled  CHB s/p PPM - followed by EP   For questions or updates, please contact [facility] Please consult [email or web address] for contact info under         Signed, Parris Cudworth VEAR Fishman, PA-C   "

## 2024-11-19 NOTE — Progress Notes (Addendum)
 Heart Failure Navigator Progress Note Patient is currently established with the Advanced Heart Failure Clinic @ [facility].   Hospital follow-up scheduled for 12/03/24 @ 9:30 AM. Patient given an appointment card and added appointment details to his AVS.  Navigator will sign off at this time.  Charmaine Pines, RN, BSN [facility] Heart Failure Navigator Secure Chat Only

## 2024-11-19 NOTE — Progress Notes (Addendum)
 " PROGRESS NOTE    [name]  FMW:[id number] DOB: [birth year] DOA: 11/16/2024 PCP: [name], MD  Assessment & Plan:   Principal Problem:   Acute on chronic systolic (congestive) heart failure (HCC) Active Problems:   CAD, s/p CABG x 5   Type 2 diabetes mellitus with hyperlipidemia (HCC)   HTN (hypertension)   HLD (hyperlipidemia)   Paroxysmal atrial fibrillation (HCC)   GERD without esophagitis   CKD (chronic kidney disease) stage 4, GFR 15-29 ml/min (HCC)   Thrombocytopenia   Obesity (BMI 30-39.9)   History of sustained ventricular fibrillation   History of cardiac arrest   S/P CABG x 5   Complete heart block s/p pacemaker [facility])   Cardiac resynchronization therapy pacemaker (CRT-P) in place   Bradycardia   History of prostate cancer  Assessment and Plan:  Acute on chronic systolic CHF: continue on IV lasix  120mg  BID. Monitor I/Os. Fluid restriction. Last echo  October with EF 25-30%, indeterminate diastolic function, mildly reduced RV function, large pericardial effusion.  Repeat echocardiogram at [facility] on 12/26 showed EF reduced to 20%. Continue on coreg , entresto , aldactone  as per cardio. Cardio following and recs apprec   Hypokalemia: WNL today   Hypomagnesemia: mg sulfate ordered    HTN: continue on entresto , aldactone , coreg , lasix    HLD: continue on statin    GERD: continue on PPI   DM2: fair control, HbA1c 7.3 in 06/2024. Continue on SSI w/ accuchecks    Hx of CAD: s/p CABG. Continue on coreg , entresto , aldactone , statin     PAF: continue on coreg , eliquis      Hx of V-fib: as well as complete heart block. S/p pacemaker    CKDIV: Cr is trending down slightly from day prior. Avoid nephrotoxic meds    Obesity: BMI 36.4. Would benefit from weight loss    Hx of prostate cancer: management per [facility] oncology    Gout: not taking any meds for this currently    Thrombocytopenia: WNL today Will continue to monitor      DVT prophylaxis: eliquis  Code  Status: full  Family Communication:  Disposition Plan: PT/OT recs SNF but pt is refusing SNF   Level of care: Progressive  Status is: Inpatient Remains inpatient appropriate because: severity of illness    Consultants:  Cardio   Procedures:   Antimicrobials:    Subjective: Pt c/o leg pain    Objective: Vitals:   11/19/24 0038 11/19/24 0519 11/19/24 0858 11/19/24 0901  BP: 123/76 130/80  121/76  Pulse: 70 88  (!) 50  Resp: 18 18  18   Temp: 98 F (36.7 C) 98.3 F (36.8 C)  98.5 F (36.9 C)  TempSrc:      SpO2: 100% 100% 99% 100%  Weight:  66.1 kg    Height:        Intake/Output Summary (Last 24 hours) at 11/19/2024 1556 Last data filed at 11/19/2024 0400 Gross per 24 hour  Intake 650 ml  Output 1350 ml  Net -700 ml   Filed Weights   11/16/24 1516 11/19/24 0519  Weight: 108.9 kg 66.1 kg    Examination:  General exam: Appears uncomfortable  Respiratory system: course breath sounds b/l  Cardiovascular system: S1 & S2+. B/L LE pitting edema  Gastrointestinal system: abd is soft, NT, ND & hypoactive bowel sounds  Central nervous system: alert & oriented. Moves all extremities   Psychiatry: judgement and insight appears at baseline. Flat mood and affect     Data Reviewed: I have  personally reviewed following labs and imaging studies  CBC: Recent Labs  Lab 11/16/24 1519 11/17/24 0108 11/17/24 1448 11/18/24 0230 11/19/24 0434  WBC 3.1* 2.2* 4.2 4.7 4.2  HGB 12.4* 9.1* 12.4* 12.5* 12.8*  HCT 39.9 30.1* 40.2 40.2 41.2  MCV 89.1 91.8 89.3 89.1 88.6  PLT 101* 79* 127* 133* 153   Basic Metabolic Panel: Recent Labs  Lab 11/16/24 1519 11/16/24 1718 11/17/24 0108 11/18/24 0230 11/19/24 0434  NA 142  --  144 143 143  K 3.3*  --  3.6 3.7 3.9  CL 100  --  102 102 100  CO2 29  --  31 32 33*  GLUCOSE 129*  --  165* 111* 101*  BUN 57*  --  58* 56* 52*  CREATININE 2.29*  --  2.42* 2.17* 2.16*  CALCIUM  8.2*  --  7.8* 8.2* 8.1*  MG  --  1.7  --   --   1.7   GFR: Estimated Creatinine Clearance: 22.5 mL/min (A) (by C-G formula based on SCr of 2.16 mg/dL (H)). Liver Function Tests: Recent Labs  Lab 11/17/24 0108  AST 23  ALT 15  ALKPHOS 105  BILITOT 0.4  PROT 6.7  ALBUMIN  3.4*   No results for input(s): LIPASE, AMYLASE in the last 168 hours. No results for input(s): AMMONIA in the last 168 hours. Coagulation Profile: No results for input(s): INR, PROTIME in the last 168 hours. Cardiac Enzymes: No results for input(s): CKTOTAL, CKMB, CKMBINDEX, TROPONINI in the last 168 hours. BNP (last 3 results) Recent Labs    10/10/24 1058 10/15/24 1935 11/16/24 1519  PROBNP 24,507.0* >35,000.0* >35,000.0*   HbA1C: No results for input(s): HGBA1C in the last 72 hours. CBG: Recent Labs  Lab 11/18/24 0757 11/18/24 1135 11/18/24 1750 11/19/24 0903 11/19/24 1324  GLUCAP 112* 150* 158* 92 203*   Lipid Profile: No results for input(s): CHOL, HDL, LDLCALC, TRIG, CHOLHDL, LDLDIRECT in the last 72 hours. Thyroid Function Tests: No results for input(s): TSH, T4TOTAL, FREET4, T3FREE, THYROIDAB in the last 72 hours. Anemia Panel: No results for input(s): VITAMINB12, FOLATE, FERRITIN, TIBC, IRON, RETICCTPCT in the last 72 hours. Sepsis Labs: No results for input(s): PROCALCITON, LATICACIDVEN in the last 168 hours.  No results found for this or any previous visit (from the past 240 hours).       Radiology Studies: No results found.       Scheduled Meds:  amiodarone   200 mg Oral BID   apixaban   2.5 mg Oral BID   carvedilol   6.25 mg Oral BID WC   guaiFENesin   600 mg Oral BID   insulin  aspart  0-15 Units Subcutaneous TID WC   insulin  aspart  0-5 Units Subcutaneous QHS   metolazone   5 mg Oral Once   pantoprazole   40 mg Oral BID   rosuvastatin   20 mg Oral Daily   sacubitril -valsartan   1 tablet Oral BID   sodium chloride  flush  3 mL Intravenous Q12H   spironolactone    25 mg Oral Daily   Continuous Infusions:  furosemide  Stopped (11/19/24 1205)   magnesium  sulfate bolus IVPB       LOS: 3 days     Anthony CHRISTELLA Pouch, MD [facility] Pager [phone]  If 7PM-7AM, please contact night-coverage [email or web address] 11/19/2024, 3:56 PM   "

## 2024-11-19 NOTE — TOC Initial Note (Signed)
 Transition of Care [facility]) - Initial/Assessment Note    Patient Details  Name: [name] MRN: [id number] Date of Birth: 24-Nov-1934  Transition of Care [facility]) CM/SW Contact:    [name], RN Phone Number: 11/19/2024, 1:17 PM  Clinical Narrative:                 RNCM met with patient in hospital room. Introduced self and explained role. Patient was alone. Patient says he lives in house with his wife, 2 daughters and his grandson. His wife or his daughters will drive him to appointments. [facility] [address] [facility] needs. Denied any difficulty affording medication. Does not have any current home health services but is agreeable to initiating. Patient refused SNF placement. States current DME in the home includes walker, commode, wheelchair.    Will initiate HH search. Notified MD of SNF refusal.   Expected Discharge Plan: Home w Home Health Services Barriers to Discharge: Continued Medical Work up   Patient Goals and CMS Choice            Expected Discharge Plan and Services In-house Referral: Clinical Social Work                                            Prior Living Arrangements/Services   Lives with:: Spouse, Adult Children, Minor Children Patient language and need for interpreter reviewed:: Yes Do you feel safe going back to the place where you live?: Yes      Need for Family Participation in Patient Care: Yes (Comment) Care giver support system in place?: Yes (comment)   Criminal Activity/Legal Involvement Pertinent to Current Situation/Hospitalization: No - Comment as needed  Activities of Daily Living      Permission Sought/Granted Permission sought to share information with : Case Manager Permission granted to share information with : Yes, Verbal Permission Granted              Emotional Assessment Appearance:: Appears younger than stated age Attitude/Demeanor/Rapport: Lethargic, Gracious Affect (typically observed):  Appropriate Orientation: : Oriented to Self, Oriented to Place, Oriented to  Time, Oriented to Situation Alcohol / Substance Use: Not Applicable Psych Involvement: No (comment)  Admission diagnosis:  Leg swelling [M79.89] Acute on chronic systolic (congestive) heart failure (HCC) [I50.23] Acute on chronic congestive heart failure, unspecified heart failure type (HCC) [I50.9] Patient Active Problem List   Diagnosis Date Noted   Acute on chronic systolic (congestive) heart failure (HCC) 11/16/2024   Upper GI bleed 09/16/2024   Hematochezia 09/16/2024   Scrotal edema 03/24/2024   Shortness of breath 12/24/2023   Chronic systolic CHF (congestive heart failure) (HCC) 06/03/2023   Cardiac resynchronization therapy pacemaker (CRT-P) in place 05/10/2023   Hypomagnesemia 03/09/2023   Paroxysmal atrial fibrillation (HCC) 02/08/2023   Constipation 12/19/2022   Thrombocytopenia 12/17/2022   CKD (chronic kidney disease) stage 4, GFR 15-29 ml/min (HCC) 12/17/2022   Obesity (BMI 30-39.9) 12/17/2022   Hypokalemia 12/17/2022   Ischemic cardiomyopathy 12/17/2022   GERD without esophagitis 03/29/2022   Type 2 diabetes mellitus with hyperlipidemia (HCC) 03/29/2022   Dilated cardiomyopathy (HCC) 10/30/2021   Complete heart block s/p pacemaker (HCC) 10/12/2021   HLD (hyperlipidemia)    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   CAD, s/p CABG x 5 08/25/2020   History of cardiac arrest 08/21/2020   History of sustained ventricular fibrillation 08/18/2020   HTN (hypertension)  07/26/2019   Gout 05/22/2015   Bradycardia 06/05/2013   Diverticulosis of colon 05/25/2011   Unstable angina (HCC) 12/03/2008   History of prostate cancer 10/05/2005   PCP:  [name], MD Pharmacy:   [facility] [phone] [place], [place] - [address] [place] [address] Phone: [phone] Fax: [phone]  [facility] [address] [place] [address] Phone:  [phone] Fax: [phone]  [id number] [place], [place] - [address] AT [facility] [address] [place] [address] Phone: [phone] Fax: [phone]     Social Drivers of Health (SDOH) Social History: SDOH Screenings   Food Insecurity: No Food Insecurity (11/18/2024)  Housing: Low Risk (11/18/2024)  Transportation Needs: No Transportation Needs (11/18/2024)  Utilities: Not At Risk (11/18/2024)  Depression (PHQ2-9): Low Risk (04/07/2022)  Financial Resource Strain: Low Risk (09/21/2024)   Received from [facility]  Social Connections: Moderately Integrated (11/18/2024)  Tobacco Use: Medium Risk (11/16/2024)   SDOH Interventions:     Readmission Risk Interventions    11/19/2024    1:11 PM 10/17/2024   10:32 AM 03/26/2024    4:09 PM  Readmission Risk Prevention Plan  Transportation Screening Complete Complete Complete  Medication Review Oceanographer) Complete Complete Complete  PCP or Specialist appointment within 3-5 days of discharge -- Complete Complete  HRI or Home Care Consult Complete Complete Complete  SW Recovery Care/Counseling Consult  Complete Complete  Palliative Care Screening Not Applicable Not Applicable Not Applicable  Skilled Nursing Facility Complete Not Applicable Not Applicable

## 2024-11-19 NOTE — Plan of Care (Signed)

## 2024-11-19 NOTE — Plan of Care (Addendum)
 Patient educated about importance of moving to get better and heal faster.  Confirmed ambulation to be Level 4 with walker.  Patient stated,  I have severe pain in my RT knee and I can't get up.  When asked what he did outside the hospital, he stated, I get up.  Then I said,  well then you need to get up here too, so you can continue to get up when you discharge.  Patient shown where the call lights were x3 and informed what to expect if he pushes it.  Patient agreed, but was not happy about.  NT also informed that purwick had been removed and patient would be calling when he needs to get up.  Throughout the day, patient continued to refuse to get out of bed.  Stated,  I'll just use the urinal.  Patient mainly able to move self in the bed - but staff monitored patient's position and assisted when able.  Urinal emptied when needed and pain meds given 3x.

## 2024-11-19 NOTE — Progress Notes (Signed)
 Respiratory assessment protocol completed.  Duonebs changed to Q6PRN Xray from 1-9 shows Minimal bibasilar subsegmental atelectasis with small left pleural effusion. No opacities.  Pt could be on RA as well  no signs of any respiratory distress

## 2024-11-20 DIAGNOSIS — I5023 Acute on chronic systolic (congestive) heart failure: Secondary | ICD-10-CM | POA: Diagnosis not present

## 2024-11-20 DIAGNOSIS — I480 Paroxysmal atrial fibrillation: Secondary | ICD-10-CM | POA: Diagnosis not present

## 2024-11-20 LAB — CBC
HCT: 37.8 % — ABNORMAL LOW (ref 39.0–52.0)
Hemoglobin: 11.9 g/dL — ABNORMAL LOW (ref 13.0–17.0)
MCH: 27.5 pg (ref 26.0–34.0)
MCHC: 31.5 g/dL (ref 30.0–36.0)
MCV: 87.5 fL (ref 80.0–100.0)
Platelets: 148 K/uL — ABNORMAL LOW (ref 150–400)
RBC: 4.32 MIL/uL (ref 4.22–5.81)
RDW: 16.9 % — ABNORMAL HIGH (ref 11.5–15.5)
WBC: 5.6 K/uL (ref 4.0–10.5)
nRBC: 0 % (ref 0.0–0.2)

## 2024-11-20 LAB — BASIC METABOLIC PANEL WITH GFR
Anion gap: 11 (ref 5–15)
BUN: 54 mg/dL — ABNORMAL HIGH (ref 8–23)
CO2: 30 mmol/L (ref 22–32)
Calcium: 8.3 mg/dL — ABNORMAL LOW (ref 8.9–10.3)
Chloride: 98 mmol/L (ref 98–111)
Creatinine, Ser: 2.38 mg/dL — ABNORMAL HIGH (ref 0.61–1.24)
GFR, Estimated: 26 mL/min — ABNORMAL LOW
Glucose, Bld: 147 mg/dL — ABNORMAL HIGH (ref 70–99)
Potassium: 3.9 mmol/L (ref 3.5–5.1)
Sodium: 139 mmol/L (ref 135–145)

## 2024-11-20 LAB — GLUCOSE, CAPILLARY
Glucose-Capillary: 88 mg/dL (ref 70–99)
Glucose-Capillary: 183 mg/dL — ABNORMAL HIGH (ref 70–99)
Glucose-Capillary: 149 mg/dL — ABNORMAL HIGH (ref 70–99)
Glucose-Capillary: 167 mg/dL — ABNORMAL HIGH (ref 70–99)

## 2024-11-20 MED ORDER — METOLAZONE 5 MG PO TABS
5.0000 mg | ORAL_TABLET | Freq: Once | ORAL | Status: AC
  Administered 2024-11-20: 5 mg via ORAL
  Filled 2024-11-20: qty 1

## 2024-11-20 NOTE — TOC Progression Note (Signed)
 Transition of Care [facility]) - Progression Note    Patient Details  Name: Jonatan Wilsey MRN: [id number] Date of Birth: 1935/07/21  Transition of Care [facility]) CM/SW Contact  [name], RN Phone Number: 11/20/2024, 2:56 PM  Clinical Narrative:     At patient request, reviewed [facility] offers with his wife on the telephone. [facility] was selected.  Linked in hub and notified agency representative.  Will follow for additional DC needs  Expected Discharge Plan: Home w Home Health Services Barriers to Discharge: Continued Medical Work up               Expected Discharge Plan and Services In-house Referral: Clinical Social Work                                             Social Drivers of Health (SDOH) Interventions SDOH Screenings   Food Insecurity: No Food Insecurity (11/18/2024)  Housing: Low Risk (11/18/2024)  Transportation Needs: No Transportation Needs (11/18/2024)  Utilities: Not At Risk (11/18/2024)  Depression (PHQ2-9): Low Risk (04/07/2022)  Financial Resource Strain: Low Risk (09/21/2024)   Received from [facility]  Social Connections: Moderately Integrated (11/18/2024)  Tobacco Use: Medium Risk (11/16/2024)    Readmission Risk Interventions    11/19/2024    1:11 PM 10/17/2024   10:32 AM 03/26/2024    4:09 PM  Readmission Risk Prevention Plan  Transportation Screening Complete Complete Complete  Medication Review Oceanographer) Complete Complete Complete  PCP or Specialist appointment within 3-5 days of discharge -- Complete Complete  HRI or Home Care Consult Complete Complete Complete  SW Recovery Care/Counseling Consult  Complete Complete  Palliative Care Screening Not Applicable Not Applicable Not Applicable  Skilled Nursing Facility Complete Not Applicable Not Applicable

## 2024-11-20 NOTE — Care Management Important Message (Signed)
 Important Message  Patient Details  Name: [name] MRN: [id number] Date of Birth: 1935/09/07   Important Message Given:  Yes - Medicare IM     Jaidan Prevette W, CMA 11/20/2024, 11:37 AM

## 2024-11-20 NOTE — Progress Notes (Signed)
 " PROGRESS NOTE   HPI was taken from Dr. Seena: [name] is a 89 y.o. male with medical history significant of hypertension, hyperlipidemia, GERD, diabetes, CAD status post CABG, paroxysmal atrial fibrillation, bradycardia, V-fib arrest, complete heart block, status post pacemaker/AICD, chronic systolic CHF, CKD 4, obesity, prostate cancer, gout, thrombocytopenia presenting with worsening edema and shortness of breath.   Patient with recurrent admissions for advanced CHF.  Most recently was admitted to [facility] from 12/26 until 12/30.  Repeat echocardiogram there showed EF 20% (previously 25-30%).  Diuresed with improvement.  Was discharged on torsemide  twice daily and his spironolactone , Entresto , Farxiga  was held.   Followed up with the heart failure clinic today found to have worsening edema and shortness of breath despite taking torsemide  alternating 40 and 60 mg.  Patient sent to the ED for IV diuresis.   Patient denies fevers, chills, abdominal pain, constipation, diarrhea, nausea, vomiting.   ED Course: Vital signs in the ED stable.   Lab workup included BMP with potassium 3.3, BUN 57, creatinine stable at 2.29, glucose 129, calcium  8.2.  CBC with leukopenia 3.1 and platelets stable at 101.  proBNP elevated to greater than 35,000.  Troponin 109 on first check, repeat pending.   Chest x-ray with minimal basilar atelectasis with small pleural effusion.   Patient received 80 mg IV Lasix  in the ED   [name]  FMW:[id number] DOB: [birth year] DOA: 11/16/2024 PCP: [name], MD  Assessment & Plan:   Principal Problem:   Acute on chronic systolic (congestive) heart failure (HCC) Active Problems:   CAD, s/p CABG x 5   Type 2 diabetes mellitus with hyperlipidemia (HCC)   HTN (hypertension)   HLD (hyperlipidemia)   Paroxysmal atrial fibrillation (HCC)   GERD without esophagitis   CKD (chronic kidney disease) stage 4, GFR 15-29 ml/min (HCC)   Thrombocytopenia   Obesity (BMI  30-39.9)   History of sustained ventricular fibrillation   History of cardiac arrest   S/P CABG x 5   Complete heart block s/p pacemaker [facility])   Cardiac resynchronization therapy pacemaker (CRT-P) in place   Bradycardia   History of prostate cancer  Assessment and Plan:  Acute on chronic systolic CHF: continue on IV lasix  120mg  BID as per cardio. Monitor I/Os. Fluid restriction. Last echo in October with EF 25-30%, indeterminate diastolic function, mildly reduced RV function, large pericardial effusion.  Repeat echocardiogram at [facility] on 12/26 showed EF reduced to 20%. Continue on coreg , entresto , aldactone  as per cardio. Cardio following and recs apprec   Hypokalemia: WNL today   Hypomagnesemia: will re-check in AM    HTN: continue on aldactone , coreg , lasix , entresto    HLD: continue on statin    GERD: continue on PPI   DM2: fair control, HbA1c 7.3 in 06/2024. Continue on SSI w/ accuchecks    Hx of CAD: s/p CABG. Continue on aldactone , coreg , entresto , statin    PAF: continue on eliquis , coreg       Hx of V-fib: as well as complete heart block. S/p pacemaker    CKDIV: Cr is labile. Avoid nephrotoxic meds    Obesity: BMI 36.4. Would benefit from weight loss    Hx of prostate cancer: management per [facility] oncology    Gout: not taking any meds for this currently    Thrombocytopenia: labile. Will continue to monitor   Generalized weakness: PT/OT recs SNF but pt adamantly refuses SNF    DVT prophylaxis: eliquis  Code Status: full  Family Communication:  Disposition Plan:  PT/OT recs SNF but pt is refusing SNF so will likely d/c back home   Level of care: Progressive  Status is: Inpatient Remains inpatient appropriate because: severity of illness    Consultants:  Cardio   Procedures:   Antimicrobials:    Subjective: Pt c/o knee pain    Objective: Vitals:   11/20/24 0008 11/20/24 0349 11/20/24 0449 11/20/24 0808  BP: 121/64 126/75  131/76  Pulse: 63 64  69   Resp: 15 16  16   Temp: 97.9 F (36.6 C) 98.4 F (36.9 C)  98.2 F (36.8 C)  TempSrc: Oral   Oral  SpO2: 94% 100%  97%  Weight:   65.5 kg   Height:        Intake/Output Summary (Last 24 hours) at 11/20/2024 0932 Last data filed at 11/20/2024 0600 Gross per 24 hour  Intake 1220.54 ml  Output 1150 ml  Net 70.54 ml   Filed Weights   11/16/24 1516 11/19/24 0519 11/20/24 0449  Weight: 108.9 kg 66.1 kg 65.5 kg    Examination:  General exam: Appears calm but uncomfortable  Respiratory system: course breath sounds b/l  Cardiovascular system: S1/S2+. B/L LE pitting edema  Gastrointestinal system: abd is soft, NT, ND & hypoactive bowel sounds  Central nervous system: alert & oriented. Moves all extremities   Psychiatry: judgement and insight appears at baseline. Flat mood and affect     Data Reviewed: I have personally reviewed following labs and imaging studies  CBC: Recent Labs  Lab 11/16/24 1519 11/17/24 0108 11/17/24 1448 11/18/24 0230 11/19/24 0434  WBC 3.1* 2.2* 4.2 4.7 4.2  HGB 12.4* 9.1* 12.4* 12.5* 12.8*  HCT 39.9 30.1* 40.2 40.2 41.2  MCV 89.1 91.8 89.3 89.1 88.6  PLT 101* 79* 127* 133* 153   Basic Metabolic Panel: Recent Labs  Lab 11/16/24 1519 11/16/24 1718 11/17/24 0108 11/18/24 0230 11/19/24 0434  NA 142  --  144 143 143  K 3.3*  --  3.6 3.7 3.9  CL 100  --  102 102 100  CO2 29  --  31 32 33*  GLUCOSE 129*  --  165* 111* 101*  BUN 57*  --  58* 56* 52*  CREATININE 2.29*  --  2.42* 2.17* 2.16*  CALCIUM  8.2*  --  7.8* 8.2* 8.1*  MG  --  1.7  --   --  1.7   GFR: Estimated Creatinine Clearance: 22.3 mL/min (A) (by C-G formula based on SCr of 2.16 mg/dL (H)). Liver Function Tests: Recent Labs  Lab 11/17/24 0108  AST 23  ALT 15  ALKPHOS 105  BILITOT 0.4  PROT 6.7  ALBUMIN  3.4*   No results for input(s): LIPASE, AMYLASE in the last 168 hours. No results for input(s): AMMONIA in the last 168 hours. Coagulation Profile: No results for  input(s): INR, PROTIME in the last 168 hours. Cardiac Enzymes: No results for input(s): CKTOTAL, CKMB, CKMBINDEX, TROPONINI in the last 168 hours. BNP (last 3 results) Recent Labs    10/10/24 1058 10/15/24 1935 11/16/24 1519  PROBNP 24,507.0* >35,000.0* >35,000.0*   HbA1C: No results for input(s): HGBA1C in the last 72 hours. CBG: Recent Labs  Lab 11/18/24 1750 11/19/24 0903 11/19/24 1324 11/19/24 1956 11/20/24 0806  GLUCAP 158* 92 203* 91 88   Lipid Profile: No results for input(s): CHOL, HDL, LDLCALC, TRIG, CHOLHDL, LDLDIRECT in the last 72 hours. Thyroid Function Tests: No results for input(s): TSH, T4TOTAL, FREET4, T3FREE, THYROIDAB in the last 72 hours. Anemia Panel:  No results for input(s): VITAMINB12, FOLATE, FERRITIN, TIBC, IRON, RETICCTPCT in the last 72 hours. Sepsis Labs: No results for input(s): PROCALCITON, LATICACIDVEN in the last 168 hours.  No results found for this or any previous visit (from the past 240 hours).       Radiology Studies: No results found.       Scheduled Meds:  amiodarone   200 mg Oral BID   apixaban   2.5 mg Oral BID   carvedilol   6.25 mg Oral BID WC   guaiFENesin   600 mg Oral BID   insulin  aspart  0-15 Units Subcutaneous TID WC   insulin  aspart  0-5 Units Subcutaneous QHS   pantoprazole   40 mg Oral BID   rosuvastatin   20 mg Oral Daily   sacubitril -valsartan   1 tablet Oral BID   sodium chloride  flush  3 mL Intravenous Q12H   spironolactone   25 mg Oral Daily   Continuous Infusions:  furosemide  120 mg (11/19/24 2200)   magnesium  sulfate bolus IVPB Stopped (11/19/24 1358)     LOS: 4 days     Anthony CHRISTELLA Pouch, MD [facility] Pager [phone]  If 7PM-7AM, please contact night-coverage [email or web address] 11/20/2024, 9:32 AM   "

## 2024-11-20 NOTE — Progress Notes (Signed)
 Occupational Therapy Treatment Patient Details Name: [name] MRN: [id number] DOB: 05-12-1935 Today's Date: 11/20/2024   History of present illness 89 y/o male presented to ED on 11/16/24 from heart failure clinic for B LE swelling and SOB. Admitted for acute on chronic systolic CHF and hypokalemia. PMH: PAF on Eliquis , CAD s/p CAD status post CABG x5 and multiple stents, ischemic cardiomyopathy (EF 25-30% 12/2023), pacemaker, CHF, back sx, and CKD, HTN, T2DM   OT comments  Patient seen for OT treatment on this date. Upon arrival to room patient resting in bed with family present and RN present. Patient has been refusing mobility, OT educated on importance of OOB activity to promote mobility/independence, patient agreeable to work with OT. Patient also refusing SNF, OT expressed concerns regarding patient discharging home at current level of A but patient states his family can provide max A. Patient transitioned to EOB with max A; sit<>stand at countertop with max A for lifting, significant trunk flexion and increased UE support required. Patient performing below baseline and requiring more A than initial therapy eval, patient had pain medicine 1-2 hours ago, patient reports I'm doing a lot worse than I was.Patient unable to take any effective side steps up towards [facility] and returned to supine. He required max A to return to supine, placed in off loaded position to the L.  bed/chair alarm on and all needs within reach. Patient making fair progress toward goals, will continue to follow POC. Discharge recommendation remains appropriate.        If plan is discharge home, recommend the following:  A lot of help with walking and/or transfers;A lot of help with bathing/dressing/bathroom;Help with stairs or ramp for entrance   Equipment Recommendations  None recommended by OT    Recommendations for Other Services      Precautions / Restrictions Precautions Precautions: Fall Recall of  Precautions/Restrictions: Intact Restrictions Weight Bearing Restrictions Per Provider Order: No       Mobility Bed Mobility Overal bed mobility: Needs Assistance Bed Mobility: Supine to Sit, Sit to Supine     Supine to sit: Mod assist Sit to supine: Max assist   General bed mobility comments: decreased independence with bed mobility this date, limited more by R knee pain and deconditioning    Transfers Overall transfer level: Needs assistance   Transfers: Sit to/from Stand Sit to Stand: Max assist, From elevated surface           General transfer comment: sit<>stand while EOB and facing countertop, able to pull up on counter but required significant lifting A     Balance Overall balance assessment: Needs assistance Sitting-balance support: Feet supported, No upper extremity supported Sitting balance-[name]: Fair     Standing balance support: Bilateral upper extremity supported, During functional activity Standing balance-[name]: Poor                             ADL either performed or assessed with clinical judgement   ADL Overall ADL's : Needs assistance/impaired                                       General ADL Comments: anticipating that patient will require max A for LB self care tasks including toileting for steadying A;min A for UB self care tasks    Extremity/Trunk Assessment Upper Extremity Assessment Upper Extremity Assessment: Generalized weakness  Lower Extremity Assessment Lower Extremity Assessment: Defer to PT evaluation        Vision       Perception     Praxis     Communication Communication Communication: Impaired Factors Affecting Communication: Reduced clarity of speech   Cognition Arousal: Alert Behavior During Therapy: WFL for tasks assessed/performed Cognition: No apparent impairments                               Following commands: Intact        Cueing   Cueing  Techniques: Verbal cues, Tactile cues  Exercises      Shoulder Instructions       General Comments      Pertinent Vitals/ Pain       Pain Assessment Pain Assessment: Faces Faces Pain Scale: Hurts even more Pain Location: R knee Pain Descriptors / Indicators: Discomfort, Grimacing Pain Intervention(s): Premedicated before session, Monitored during session, Limited activity within patient's tolerance  Home Living                                          Prior Functioning/Environment              Frequency  Min 2X/week        Progress Toward Goals  OT Goals(current goals can now be found in the care plan section)  Progress towards OT goals: Progressing toward goals  Acute Rehab OT Goals Patient Stated Goal: to go home OT Goal Formulation: With patient Time For Goal Achievement: 12/02/24 Potential to Achieve Goals: Fair ADL Goals Pt Will Perform Grooming: standing;with contact guard assist Pt Will Perform Lower Body Dressing: with min assist;sit to/from stand Pt Will Transfer to Toilet: with contact guard assist;ambulating;bedside commode Pt Will Perform Toileting - Clothing Manipulation and hygiene: with min assist;sit to/from stand  Plan      Co-evaluation                 AM-PAC OT 6 Clicks Daily Activity     Outcome Measure   Help from another person eating meals?: None Help from another person taking care of personal grooming?: A Little Help from another person toileting, which includes using toliet, bedpan, or urinal?: A Lot Help from another person bathing (including washing, rinsing, drying)?: A Lot Help from another person to put on and taking off regular upper body clothing?: A Little Help from another person to put on and taking off regular lower body clothing?: A Lot 6 Click Score: 16    End of Session Equipment Utilized During Treatment: Gait belt  OT Visit Diagnosis: Unsteadiness on feet (R26.81);Other  abnormalities of gait and mobility (R26.89);Muscle weakness (generalized) (M62.81)   Activity Tolerance Patient limited by pain   Patient Left in bed;with call bell/phone within reach;with nursing/sitter in room;with family/visitor present   Nurse Communication Mobility status        Time: 8862-8843 OT Time Calculation (min): 19 min  Charges: OT General Charges $OT Visit: 1 Visit OT Treatments $Self Care/Home Management : 8-22 mins  Rogers Clause, OT/L MSOT, 11/20/2024

## 2024-11-20 NOTE — Progress Notes (Signed)
"  °  Progress Note  Patient Name: [name] Date of Encounter: 11/20/2024 [facility] Cardiologist: Deatrice Cage, MD   Interval Summary    UOP -1.1L, unfortunately we have not been catching all the urine. Patient feels better today.   Vital Signs Vitals:   11/20/24 0008 11/20/24 0349 11/20/24 0449 11/20/24 0808  BP: 121/64 126/75  131/76  Pulse: 63 64  69  Resp: 15 16  16   Temp: 97.9 F (36.6 C) 98.4 F (36.9 C)  98.2 F (36.8 C)  TempSrc: Oral   Oral  SpO2: 94% 100%  97%  Weight:   65.5 kg   Height:        Intake/Output Summary (Last 24 hours) at 11/20/2024 1108 Last data filed at 11/20/2024 0900 Gross per 24 hour  Intake 1641.51 ml  Output 1150 ml  Net 491.51 ml      11/20/2024    4:49 AM 11/19/2024    5:19 AM 11/16/2024    3:16 PM  Last 3 Weights  Weight (lbs) 144 lb 6.4 oz 145 lb 11.6 oz 240 lb  Weight (kg) 65.5 kg 66.1 kg 108.863 kg      Telemetry/ECG  AV pacing 70s, PVCs - Personally Reviewed  Physical Exam  GEN: No acute distress.   Neck: + JVD Cardiac: RRR, no murmurs, rubs, or gallops.  Respiratory: course breaths ounds GI: Soft, nontender, non-distended  MS: woody edema  Assessment & Plan  Acute on chronic HFrEF - presenting with worsening dyspnea, lower extremity swelling, abdominal distention - most recent echo 10/2024 at [facility] showed LVEF 20% - IV lasix  drip with 120mg  BID with metolazone  x 2 doses - UOP net -1.7L - spironolactone  25mg  daily, Entresto  24-26mg BID, Coreg  6.25mg BID - still volume up on exam - kidney function OK. Continue with diuresis   Hypokalemia - K 3.3>>3.7   Paroxysmal Afib - most recent device check showed 40% AF burden - possibly contributing to worsening CHF - tele shows AV pacing - continue coreg  - Eliquis  2.5mg  BID - started on amiodarone  200mg  BID   CAD - most recent ischemic evaluation 05/2023 with LHC showing patent grafts and stable moderate stenosis in the proximal SVG to right PDA - HS trop  minimally elevated with flat trend, not consistent with ACS - EKG without ischemic changes - continue crestor  and Coreg  - no ASA given Eliquis    HTN - BP well controlled   CHB s/p PPM - followed by EP    For questions or updates, please contact [facility] Please consult [email or web address] for contact info under         Signed, Rolin Schult VEAR Fishman, PA-C   "
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
## 2024-11-20 NOTE — Plan of Care (Signed)

## 2024-11-21 ENCOUNTER — Inpatient Hospital Stay

## 2024-11-21 ENCOUNTER — Inpatient Hospital Stay: Admitting: Oncology

## 2024-11-21 DIAGNOSIS — I2510 Atherosclerotic heart disease of native coronary artery without angina pectoris: Secondary | ICD-10-CM | POA: Diagnosis not present

## 2024-11-21 DIAGNOSIS — M7989 Other specified soft tissue disorders: Secondary | ICD-10-CM

## 2024-11-21 DIAGNOSIS — E1169 Type 2 diabetes mellitus with other specified complication: Secondary | ICD-10-CM | POA: Diagnosis not present

## 2024-11-21 DIAGNOSIS — Z951 Presence of aortocoronary bypass graft: Secondary | ICD-10-CM | POA: Diagnosis not present

## 2024-11-21 DIAGNOSIS — I442 Atrioventricular block, complete: Secondary | ICD-10-CM | POA: Diagnosis not present

## 2024-11-21 DIAGNOSIS — E785 Hyperlipidemia, unspecified: Secondary | ICD-10-CM | POA: Diagnosis not present

## 2024-11-21 DIAGNOSIS — I509 Heart failure, unspecified: Secondary | ICD-10-CM | POA: Diagnosis not present

## 2024-11-21 DIAGNOSIS — I5023 Acute on chronic systolic (congestive) heart failure: Secondary | ICD-10-CM | POA: Diagnosis not present

## 2024-11-21 DIAGNOSIS — N184 Chronic kidney disease, stage 4 (severe): Secondary | ICD-10-CM | POA: Diagnosis not present

## 2024-11-21 DIAGNOSIS — I480 Paroxysmal atrial fibrillation: Secondary | ICD-10-CM | POA: Diagnosis not present

## 2024-11-21 DIAGNOSIS — Z950 Presence of cardiac pacemaker: Secondary | ICD-10-CM | POA: Diagnosis not present

## 2024-11-21 DIAGNOSIS — I10 Essential (primary) hypertension: Secondary | ICD-10-CM | POA: Diagnosis not present

## 2024-11-21 LAB — BASIC METABOLIC PANEL WITH GFR
Anion gap: 9 (ref 5–15)
BUN: 58 mg/dL — ABNORMAL HIGH (ref 8–23)
CO2: 32 mmol/L (ref 22–32)
Calcium: 8.4 mg/dL — ABNORMAL LOW (ref 8.9–10.3)
Chloride: 98 mmol/L (ref 98–111)
Creatinine, Ser: 2.6 mg/dL — ABNORMAL HIGH (ref 0.61–1.24)
GFR, Estimated: 23 mL/min — ABNORMAL LOW
Glucose, Bld: 144 mg/dL — ABNORMAL HIGH (ref 70–99)
Potassium: 4.2 mmol/L (ref 3.5–5.1)
Sodium: 139 mmol/L (ref 135–145)

## 2024-11-21 LAB — GLUCOSE, CAPILLARY
Glucose-Capillary: 109 mg/dL — ABNORMAL HIGH (ref 70–99)
Glucose-Capillary: 161 mg/dL — ABNORMAL HIGH (ref 70–99)
Glucose-Capillary: 130 mg/dL — ABNORMAL HIGH (ref 70–99)
Glucose-Capillary: 109 mg/dL — ABNORMAL HIGH (ref 70–99)

## 2024-11-21 LAB — MAGNESIUM: Magnesium: 1.8 mg/dL (ref 1.7–2.4)

## 2024-11-21 LAB — URIC ACID: Uric Acid, Serum: 13.2 mg/dL — ABNORMAL HIGH (ref 3.7–8.6)

## 2024-11-21 MED ORDER — COLCHICINE 0.3 MG HALF TABLET: 0.3000 mg | ORAL_TABLET | ORAL | Status: DC

## 2024-11-21 MED ORDER — COLCHICINE 0.6 MG PO TABS: 0.6000 mg | ORAL_TABLET | Freq: Every day | ORAL | Status: DC

## 2024-11-21 NOTE — Progress Notes (Signed)
 PT Cancellation Note  Patient Details Name: [name] MRN: [id number] DOB: [birth year]   Cancelled Treatment:    Reason Eval/Treat Not Completed: Fatigue/lethargy limiting ability to participate (Chart reviewed, treatment attempted. Pt is somnolent, slurring words, unable to keep eyes open. Pt seems confused. Will attempt again at later date/time, try to catch pt when better able to participate.)  2:02 PM, 11/21/2024 Peggye JAYSON Linear, PT, DPT Physical Therapist - [facility]  [phone] (ASCOM)    Jayvien Rowlette C 11/21/2024, 2:01 PM

## 2024-11-21 NOTE — Plan of Care (Signed)
   Problem: Education: Goal: Ability to describe self-care measures that may prevent or decrease complications (Diabetes Survival Skills Education) will improve Outcome: Progressing Goal: Individualized Educational Video(s) Outcome: Progressing

## 2024-11-21 NOTE — Plan of Care (Signed)
" °  Problem: Education: Goal: Ability to describe self-care measures that may prevent or decrease complications (Diabetes Survival Skills Education) will improve 11/21/2024 1236 by Lynnette Cena CROME, RN Outcome: Progressing 11/21/2024 1235 by Lynnette Cena CROME, RN Outcome: Progressing Goal: Individualized Educational Video(s) 11/21/2024 1236 by Lynnette Cena CROME, RN Outcome: Progressing 11/21/2024 1235 by Lynnette Cena CROME, RN Outcome: Progressing   "
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
## 2024-11-21 NOTE — Progress Notes (Signed)
 " PROGRESS NOTE   HPI was taken from Dr. Seena: [name] is a 89 y.o. male with medical history significant of hypertension, hyperlipidemia, GERD, diabetes, CAD status post CABG, paroxysmal atrial fibrillation, bradycardia, V-fib arrest, complete heart block, status post pacemaker/AICD, chronic systolic CHF, CKD 4, obesity, prostate cancer, gout, thrombocytopenia presenting with worsening edema and shortness of breath.   Patient with recurrent admissions for advanced CHF.  Most recently was admitted to [facility] from 12/26 until 12/30.  Repeat echocardiogram there showed EF 20% (previously 25-30%).  Diuresed with improvement.  Was discharged on torsemide  twice daily and his spironolactone , Entresto , Farxiga  was held.   Followed up with the heart failure clinic today found to have worsening edema and shortness of breath despite taking torsemide  alternating 40 and 60 mg.  Patient sent to the ED for IV diuresis.   Patient denies fevers, chills, abdominal pain, constipation, diarrhea, nausea, vomiting.   ED Course: Vital signs in the ED stable.   Lab workup included BMP with potassium 3.3, BUN 57, creatinine stable at 2.29, glucose 129, calcium  8.2.  CBC with leukopenia 3.1 and platelets stable at 101.  proBNP elevated to greater than 35,000.  Troponin 109 on first check, repeat pending.   Chest x-ray with minimal basilar atelectasis with small pleural effusion.   Patient received 80 mg IV Lasix  in the ED  1/14: Complains of right knee pain.  X-ray pending.  Palliative care consult   Ioan Landini  FMW:[id number] DOB: [birth year] DOA: 11/16/2024 PCP: [name], MD  Assessment & Plan:   Principal Problem:   Acute on chronic systolic (congestive) heart failure (HCC) Active Problems:   Acute on chronic HFrEF (heart failure with reduced ejection fraction) (HCC)   CAD, s/p CABG x 5   Type 2 diabetes mellitus with hyperlipidemia (HCC)   HTN (hypertension)   HLD (hyperlipidemia)    Paroxysmal atrial fibrillation (HCC)   GERD without esophagitis   CKD (chronic kidney disease) stage 4, GFR 15-29 ml/min (HCC)   Thrombocytopenia   Obesity (BMI 30-39.9)   History of sustained ventricular fibrillation   History of cardiac arrest   S/P CABG x 5   Complete heart block s/p pacemaker [facility])   Cardiac resynchronization therapy pacemaker (CRT-P) in place   Bradycardia   History of prostate cancer   Leg swelling  Assessment and Plan:  Acute on chronic systolic CHF: continue on IV lasix  120mg  BID as per cardio. Monitor I/Os. Fluid restriction. Last echo in October with EF 25-30%, indeterminate diastolic function, mildly reduced RV function, large pericardial effusion.  Repeat echocardiogram at [facility] on 12/26 showed EF reduced to 20%. Continue on coreg , entresto , aldactone  as per cardio.  - Continue additional 24 hours of Lasix  infusion and transition to torsemide  60 mg daily possible tomorrow per cardiology   Hypokalemia, hypomagnesemia: Repleted and resolved  HTN: continue on aldactone , coreg , lasix , entresto    HLD: continue on statin    GERD: continue on PPI   DM2: fair control, HbA1c 7.3 in 06/2024. Continue on SSI w/ accuchecks    Hx of CAD: s/p CABG. Continue on aldactone , coreg , entresto , statin    PAF: continue on eliquis , coreg       Hx of V-fib: as well as complete heart block. S/p pacemaker    CKDIV: Cr is labile. Avoid nephrotoxic meds    Obesity: BMI 36.4. Would benefit from weight loss    Hx of prostate cancer: management per [facility] oncology    Gout: not taking any meds  for this currently.  Not a candidate for colchicine  per pharmacy, will check uric acid   Thrombocytopenia: labile. Will continue to monitor   Generalized weakness: PT/OT recs SNF but pt adamantly refuses SNF    DVT prophylaxis: eliquis  Code Status: full  Family Communication:  Disposition Plan: PT/OT recs SNF but pt is refusing SNF so will likely d/c back home   Level of care:  Progressive  Status is: Inpatient Remains inpatient appropriate because: severity of illness    Consultants:  Cardio    Subjective: Pt c/o right knee pain, overall feeling better  Objective: Vitals:   11/21/24 0020 11/21/24 0507 11/21/24 0822 11/21/24 1225  BP: 105/66 101/69 110/73 125/74  Pulse: 66 64 64 64  Resp:  20 19 18   Temp: 98.2 F (36.8 C) 98.3 F (36.8 C) 98 F (36.7 C) 98 F (36.7 C)  TempSrc: Oral  Oral Oral  SpO2: 100% 100% 97% 100%  Weight:  63.3 kg    Height:        Intake/Output Summary (Last 24 hours) at 11/21/2024 1538 Last data filed at 11/21/2024 1409 Gross per 24 hour  Intake 1022 ml  Output 1950 ml  Net -928 ml   Filed Weights   11/19/24 0519 11/20/24 0449 11/21/24 0507  Weight: 66.1 kg 65.5 kg 63.3 kg    Examination:  General exam: Appears calm but uncomfortable  Respiratory system: course breath sounds b/l  Cardiovascular system: S1/S2+. B/L 1+ LE pitting edema  Gastrointestinal system: abd is soft, benign Musculoskeletal mild tenderness over the patella area of right knee Central nervous system: alert & oriented. Moves all extremities   Psychiatry: judgement and insight appears at baseline. Flat mood and affect     Data Reviewed: I have personally reviewed following labs and imaging studies  CBC: Recent Labs  Lab 11/17/24 0108 11/17/24 1448 11/18/24 0230 11/19/24 0434 11/20/24 1025  WBC 2.2* 4.2 4.7 4.2 5.6  HGB 9.1* 12.4* 12.5* 12.8* 11.9*  HCT 30.1* 40.2 40.2 41.2 37.8*  MCV 91.8 89.3 89.1 88.6 87.5  PLT 79* 127* 133* 153 148*   Basic Metabolic Panel: Recent Labs  Lab 11/16/24 1718 11/17/24 0108 11/18/24 0230 11/19/24 0434 11/20/24 1025 11/21/24 0407  NA  --  144 143 143 139 139  K  --  3.6 3.7 3.9 3.9 4.2  CL  --  102 102 100 98 98  CO2  --  31 32 33* 30 32  GLUCOSE  --  165* 111* 101* 147* 144*  BUN  --  58* 56* 52* 54* 58*  CREATININE  --  2.42* 2.17* 2.16* 2.38* 2.60*  CALCIUM   --  7.8* 8.2* 8.1* 8.3*  8.4*  MG 1.7  --   --  1.7  --  1.8   GFR: Estimated Creatinine Clearance: 17.9 mL/min (A) (by C-G formula based on SCr of 2.6 mg/dL (H)). Liver Function Tests: Recent Labs  Lab 11/17/24 0108  AST 23  ALT 15  ALKPHOS 105  BILITOT 0.4  PROT 6.7  ALBUMIN  3.4*    Recent Labs    10/10/24 1058 10/15/24 1935 11/16/24 1519  PROBNP 24,507.0* >35,000.0* >35,000.0*   HbA1C: No results for input(s): HGBA1C in the last 72 hours. CBG: Recent Labs  Lab 11/20/24 1210 11/20/24 1647 11/20/24 2045 11/21/24 0823 11/21/24 1227  GLUCAP 183* 149* 167* 109* 161*      Scheduled Meds:  amiodarone   200 mg Oral BID   apixaban   2.5 mg Oral BID   carvedilol   6.25 mg Oral BID WC   guaiFENesin   600 mg Oral BID   insulin  aspart  0-15 Units Subcutaneous TID WC   insulin  aspart  0-5 Units Subcutaneous QHS   pantoprazole   40 mg Oral BID   rosuvastatin   20 mg Oral Daily   sacubitril -valsartan   1 tablet Oral BID   sodium chloride  flush  3 mL Intravenous Q12H   spironolactone   25 mg Oral Daily   Continuous Infusions:  furosemide  120 mg (11/21/24 1047)   magnesium  sulfate bolus IVPB Stopped (11/19/24 1358)     LOS: 5 days   Time spent 35 minutes  Cresencio Fairly, MD [facility] Pager [phone]  If 7PM-7AM, please contact night-coverage [email or web address] 11/21/2024, 3:38 PM   "

## 2024-11-21 NOTE — Plan of Care (Signed)

## 2024-11-21 NOTE — Progress Notes (Signed)
"  °  Progress Note  Patient Name: [name] Date of Encounter: 11/21/2024 [facility] Cardiologist: Deatrice Cage, MD   Interval Summary    UOP -1.4L. Scr/BUN up today. Weights are trending down. Patient denies chest pain. Breathing is improving. He reports right sided knee pain.  Vital Signs Vitals:   11/20/24 2100 11/21/24 0020 11/21/24 0507 11/21/24 0822  BP:  105/66 101/69 110/73  Pulse:  66 64 64  Resp:   20 19  Temp:  98.2 F (36.8 C) 98.3 F (36.8 C) 98 F (36.7 C)  TempSrc:  Oral  Oral  SpO2: 94% 100% 100% 97%  Weight:   63.3 kg   Height:        Intake/Output Summary (Last 24 hours) at 11/21/2024 1004 Last data filed at 11/21/2024 0500 Gross per 24 hour  Intake 840 ml  Output 1450 ml  Net -610 ml      11/21/2024    5:07 AM 11/20/2024    4:49 AM 11/19/2024    5:19 AM  Last 3 Weights  Weight (lbs) 139 lb 8.8 oz 144 lb 6.4 oz 145 lb 11.6 oz  Weight (kg) 63.3 kg 65.5 kg 66.1 kg      Telemetry/ECG  AV pacing - Personally Reviewed  Physical Exam  GEN: No acute distress.   Neck: + JVD Cardiac: RRR, no murmurs, rubs, or gallops.  Respiratory: wheezing GI: Soft, nontender, non-distended  MS: No edema  Assessment & Plan  Acute on chronic HFrEF - presenting with worsening dyspnea, lower extremity swelling, abdominal distention - most recent echo 10/2024 at [facility] showed LVEF 20% - IV lasix  drip with 120mg  BID with metolazone  x 3 doses - UOP net -2.3L - weight is going down 145>139lbs - spironolactone  25mg  daily, Entresto  24-26mg BID, Coreg  6.25mg BID - still volume up on exam - kidney function OK. Continue with diuresis, may only need one day of IV lasix    Hypokalemia - resolved   Paroxysmal Afib - most recent device check showed 40% AF burden - possibly contributing to worsening CHF - tele shows AV pacing - continue coreg  - Eliquis  2.5mg  BID - started on amiodarone  200mg  BID   CAD - most recent ischemic evaluation 05/2023 with [facility] showing  patent grafts and stable moderate stenosis in the proximal SVG to right PDA - HS trop minimally elevated with flat trend, not consistent with ACS - EKG without ischemic changes - continue crestor  and Coreg  - no ASA given Eliquis    HTN - BP well controlled   CHB s/p PPM - followed by EP    For questions or updates, please contact [facility] Please consult [email or web address] for contact info under         Signed, Imer Foxworth VEAR Fishman, PA-C   "

## 2024-11-22 DIAGNOSIS — E1169 Type 2 diabetes mellitus with other specified complication: Secondary | ICD-10-CM | POA: Diagnosis not present

## 2024-11-22 DIAGNOSIS — Z951 Presence of aortocoronary bypass graft: Secondary | ICD-10-CM | POA: Diagnosis not present

## 2024-11-22 DIAGNOSIS — M7989 Other specified soft tissue disorders: Secondary | ICD-10-CM | POA: Diagnosis not present

## 2024-11-22 DIAGNOSIS — E785 Hyperlipidemia, unspecified: Secondary | ICD-10-CM | POA: Diagnosis not present

## 2024-11-22 DIAGNOSIS — I5023 Acute on chronic systolic (congestive) heart failure: Secondary | ICD-10-CM | POA: Diagnosis not present

## 2024-11-22 DIAGNOSIS — I509 Heart failure, unspecified: Secondary | ICD-10-CM | POA: Diagnosis not present

## 2024-11-22 LAB — BASIC METABOLIC PANEL WITH GFR
Anion gap: 14 (ref 5–15)
BUN: 59 mg/dL — ABNORMAL HIGH (ref 8–23)
CO2: 30 mmol/L (ref 22–32)
Calcium: 8.7 mg/dL — ABNORMAL LOW (ref 8.9–10.3)
Chloride: 94 mmol/L — ABNORMAL LOW (ref 98–111)
Creatinine, Ser: 2.6 mg/dL — ABNORMAL HIGH (ref 0.61–1.24)
GFR, Estimated: 23 mL/min — ABNORMAL LOW
Glucose, Bld: 105 mg/dL — ABNORMAL HIGH (ref 70–99)
Potassium: 4.1 mmol/L (ref 3.5–5.1)
Sodium: 137 mmol/L (ref 135–145)

## 2024-11-22 LAB — GLUCOSE, CAPILLARY
Glucose-Capillary: 112 mg/dL — ABNORMAL HIGH (ref 70–99)
Glucose-Capillary: 135 mg/dL — ABNORMAL HIGH (ref 70–99)
Glucose-Capillary: 152 mg/dL — ABNORMAL HIGH (ref 70–99)
Glucose-Capillary: 231 mg/dL — ABNORMAL HIGH (ref 70–99)

## 2024-11-22 LAB — CBC
HCT: 41.9 % (ref 39.0–52.0)
Hemoglobin: 13.1 g/dL (ref 13.0–17.0)
MCH: 27.6 pg (ref 26.0–34.0)
MCHC: 31.3 g/dL (ref 30.0–36.0)
MCV: 88.2 fL (ref 80.0–100.0)
Platelets: 175 K/uL (ref 150–400)
RBC: 4.75 MIL/uL (ref 4.22–5.81)
RDW: 17 % — ABNORMAL HIGH (ref 11.5–15.5)
WBC: 6.6 K/uL (ref 4.0–10.5)
nRBC: 0 % (ref 0.0–0.2)

## 2024-11-22 MED ORDER — DICLOFENAC SODIUM 1 % EX GEL
4.0000 g | Freq: Four times a day (QID) | CUTANEOUS | Status: DC
  Administered 2024-11-22 – 2024-11-23 (×5): 4 g via TOPICAL
  Filled 2024-11-22: qty 100

## 2024-11-22 MED ORDER — OXYCODONE-ACETAMINOPHEN 5-325 MG PO TABS
1.0000 | ORAL_TABLET | Freq: Four times a day (QID) | ORAL | Status: DC | PRN
  Administered 2024-11-22: 1 via ORAL
  Filled 2024-11-22: qty 1

## 2024-11-22 NOTE — Progress Notes (Signed)
 Occupational Therapy Treatment Patient Details Name: [name] MRN: [id number] DOB: 07-09-1935 Today's Date: 11/22/2024   History of present illness 89 y/o male presented to ED on 11/16/24 from heart failure clinic for B LE swelling and SOB. Admitted for acute on chronic systolic CHF and hypokalemia. PMH: PAF on Eliquis , CAD s/p CAD status post CABG x5 and multiple stents, ischemic cardiomyopathy (EF 25-30% 12/2023), pacemaker, CHF, back sx, and CKD, HTN, T2DM   OT comments  Upon entering the room, pt supine in bed and agreeable to OT intervention. Pt reports living at home with wife and several daughters who he felt would be able to physically assist him at discharge. However, pt needing max A of 2 for rolling L <> R with use of bed rails secondary to R knee pain. Pt needing total A for hygiene to wash buttocks and change chux pad. Pt refusing attempts to sit EOB, stand, ambulate, and transfer this session. Imaging shows arthritis only in R knee but pt continues to refuse attempts. Pt remains supine in bed with bed alarm activated and all needs within reach.       If plan is discharge home, recommend the following:  Help with stairs or ramp for entrance;Two people to help with walking and/or transfers;Two people to help with bathing/dressing/bathroom   Equipment Recommendations  Hospital bed;BSC/3in1    Recommendations for Other Services      Precautions / Restrictions Precautions Precautions: Fall Recall of Precautions/Restrictions: Intact       Mobility Bed Mobility Overal bed mobility: Needs Assistance Bed Mobility: Rolling Rolling: Max assist, +2 for physical assistance              Transfers                   General transfer comment: Pt refusal         ADL either performed or assessed with clinical judgement   ADL Overall ADL's : Needs assistance/impaired                                       General ADL Comments: max A of 2 to roll  in bed and total A for hygiene and to change chux pad.    Extremity/Trunk Assessment Upper Extremity Assessment Upper Extremity Assessment: Generalized weakness   Lower Extremity Assessment Lower Extremity Assessment: Generalized weakness        Vision Patient Visual Report: No change from baseline           Communication Communication Communication: Impaired Factors Affecting Communication: Reduced clarity of speech   Cognition Arousal: Alert Behavior During Therapy: WFL for tasks assessed/performed Cognition: No apparent impairments                               Following commands: Intact Following commands impaired: Follows one step commands with increased time      Cueing   Cueing Techniques: Verbal cues, Tactile cues             Pertinent Vitals/ Pain       Pain Assessment Pain Assessment: Faces Faces Pain Scale: Hurts whole lot Pain Location: R knee Pain Descriptors / Indicators: Discomfort, Grimacing Pain Intervention(s): Monitored during session, Limited activity within patient's tolerance         Frequency  Min 2X/week  Progress Toward Goals  OT Goals(current goals can now be found in the care plan section)  Progress towards OT goals: Progressing toward goals     Plan      Co-evaluation    PT/OT/SLP Co-Evaluation/Treatment: Yes Reason for Co-Treatment: For patient/therapist safety;To address functional/ADL transfers PT goals addressed during session: Mobility/safety with mobility;Balance OT goals addressed during session: ADL's and self-care      AM-PAC OT 6 Clicks Daily Activity     Outcome Measure   Help from another person eating meals?: None Help from another person taking care of personal grooming?: A Little Help from another person toileting, which includes using toliet, bedpan, or urinal?: A Lot Help from another person bathing (including washing, rinsing, drying)?: A Lot Help from another person to  put on and taking off regular upper body clothing?: A Little Help from another person to put on and taking off regular lower body clothing?: Total 6 Click Score: 15    End of Session    OT Visit Diagnosis: Unsteadiness on feet (R26.81);Other abnormalities of gait and mobility (R26.89);Muscle weakness (generalized) (M62.81)   Activity Tolerance Patient limited by pain   Patient Left in bed;with call bell/phone within reach;with bed alarm set   Nurse Communication          Time: 8585-8562 OT Time Calculation (min): 23 min  Charges: OT General Charges $OT Visit: 1 Visit OT Treatments $Self Care/Home Management : 8-22 mins  Izetta Claude, MS, OTR/L , CBIS ascom [phone]  11/22/24, 3:30 PM

## 2024-11-22 NOTE — Progress Notes (Signed)
 "  Rounding Note   Patient Name: [name] Date of Encounter: 11/22/2024  [facility] Cardiologist: Deatrice Cage, MD  Subjective Lethargic in bed, no complaints  Knee hurts Denies significant shortness of breath or leg swelling  Scheduled Meds:  amiodarone   200 mg Oral BID   apixaban   2.5 mg Oral BID   carvedilol   6.25 mg Oral BID WC   diclofenac  Sodium  4 g Topical QID   guaiFENesin   600 mg Oral BID   insulin  aspart  0-15 Units Subcutaneous TID WC   insulin  aspart  0-5 Units Subcutaneous QHS   pantoprazole   40 mg Oral BID   rosuvastatin   20 mg Oral Daily   sacubitril -valsartan   1 tablet Oral BID   sodium chloride  flush  3 mL Intravenous Q12H   spironolactone   25 mg Oral Daily   Continuous Infusions:  furosemide  120 mg (11/22/24 0919)   magnesium  sulfate bolus IVPB Stopped (11/19/24 1358)   PRN Meds: acetaminophen  **OR** acetaminophen , chlorpheniramine-HYDROcodone , ipratropium-albuterol , morphine  injection, oxyCODONE -acetaminophen , polyethylene glycol   Vital Signs  Vitals:   11/21/24 1642 11/22/24 0500 11/22/24 0905 11/22/24 1227  BP: 116/66  (!) 116/57 (!) 117/59  Pulse: 63  66 71  Resp: 19   18  Temp: (!) 97.5 F (36.4 C)   98.9 F (37.2 C)  TempSrc:      SpO2: 100%  95% 94%  Weight:  63.3 kg    Height:        Intake/Output Summary (Last 24 hours) at 11/22/2024 1645 Last data filed at 11/22/2024 1100 Gross per 24 hour  Intake 0 ml  Output 2550 ml  Net -2550 ml      11/22/2024    5:00 AM 11/21/2024    5:07 AM 11/20/2024    4:49 AM  Last 3 Weights  Weight (lbs) 139 lb 8.8 oz 139 lb 8.8 oz 144 lb 6.4 oz  Weight (kg) 63.3 kg 63.3 kg 65.5 kg      Telemetry Normal sinus rhythm- Personally Reviewed  ECG   - Personally Reviewed  Physical Exam  GEN: No acute distress.   Neck: No JVD Cardiac: RRR, no murmurs, rubs, or gallops.  Respiratory: Clear to auscultation bilaterally. GI: Soft, nontender, non-distended  MS: No edema; No  deformity. Neuro:  Nonfocal  Psych: Normal affect, lethargic  Labs High Sensitivity Troponin:  No results for input(s): TROPONINIHS in the last 720 hours.  Recent Labs  Lab 11/16/24 1718 11/17/24 0108 11/17/24 0338 11/18/24 0230 11/18/24 2012  TRNPT 105* 108* 118* 124* 110*       Chemistry Recent Labs  Lab 11/16/24 1718 11/17/24 0108 11/18/24 0230 11/19/24 0434 11/20/24 1025 11/21/24 0407 11/22/24 0402  NA  --  144   < > 143 139 139 137  K  --  3.6   < > 3.9 3.9 4.2 4.1  CL  --  102   < > 100 98 98 94*  CO2  --  31   < > 33* 30 32 30  GLUCOSE  --  165*   < > 101* 147* 144* 105*  BUN  --  58*   < > 52* 54* 58* 59*  CREATININE  --  2.42*   < > 2.16* 2.38* 2.60* 2.60*  CALCIUM   --  7.8*   < > 8.1* 8.3* 8.4* 8.7*  MG 1.7  --   --  1.7  --  1.8  --   PROT  --  6.7  --   --   --   --   --  ALBUMIN   --  3.4*  --   --   --   --   --   AST  --  23  --   --   --   --   --   ALT  --  15  --   --   --   --   --   ALKPHOS  --  105  --   --   --   --   --   BILITOT  --  0.4  --   --   --   --   --   GFRNONAA  --  25*   < > 29* 26* 23* 23*  ANIONGAP  --  11   < > 10 11 9 14    < > = values in this interval not displayed.    Lipids No results for input(s): CHOL, TRIG, HDL, LABVLDL, LDLCALC, CHOLHDL in the last 168 hours.  Hematology Recent Labs  Lab 11/19/24 0434 11/20/24 1025 11/22/24 0402  WBC 4.2 5.6 6.6  RBC 4.65 4.32 4.75  HGB 12.8* 11.9* 13.1  HCT 41.2 37.8* 41.9  MCV 88.6 87.5 88.2  MCH 27.5 27.5 27.6  MCHC 31.1 31.5 31.3  RDW 17.0* 16.9* 17.0*  PLT 153 148* 175   Thyroid No results for input(s): TSH, FREET4 in the last 168 hours.  BNP Recent Labs  Lab 11/16/24 1519  PROBNP >35,000.0*    DDimer No results for input(s): DDIMER in the last 168 hours.   Radiology  DG Knee Complete 4 Views Right Result Date: 11/21/2024 CLINICAL DATA:  Right knee pain and swelling EXAM: RIGHT KNEE - COMPLETE 4+ VIEW COMPARISON:  None Available. FINDINGS:  No evidence of fracture or dislocation. Moderate size suprapatellar joint effusion is noted. Severe narrowing of medial joint space is noted. Mild osteophyte formation is noted laterally. Mild narrowing and osteophyte formation of patellofemoral space is noted. Soft tissues are unremarkable. IMPRESSION: Tricompartmental degenerative joint disease as described above. Moderate size suprapatellar joint effusion. No fracture or dislocation. Electronically Signed   By: Lynwood Landy Raddle M.D.   On: 11/21/2024 15:42    Cardiac Studies   Patient Profile   [name] is an 89 year old man with history of chronic HFrEF, CAD, and PAF, admitted with acute on chronic HFrEF.   Assessment & Plan  Acute on chronic HFrEF -Frequent hospitalizations over the past 2 months, including admissions at [facility] -Recurrent diastolic and systolic CHF - Typically presents with leg swelling, abdominal distention, shortness of breath - Suspect symptoms exacerbated by paroxysmal atrial fibrillation with high burden 40% on pacer download - most recent echo 10/2024 at [facility] showed LVEF 20% - This admission, treated with IV lasix  drip with 120mg  BID with metolazone  x several doses, - -5 L today - Recommend we continue spironolactone  25mg  daily, Entresto  24-26mg BID, Coreg  6.25mg BID - Would plan on transition to torsemide  tomorrow morning 60 daily   Hypokalemia Stable 4.1   Paroxysmal Afib - Pacer check detailing 40% AF burden - Suspect may be contributing to frequent hospital admissions for CHF exacerbation acute CHF symptoms  started on amiodarone  200 twice daily, continue carvedilol  - Continue Eliquis  2.5mg  BID   CAD - most recent ischemic evaluation 05/2023 with [facility] showing patent grafts and stable moderate stenosis in the proximal SVG to right PDA -Minimally elevated nontrending troponin, no further ischemic workup needed - EKG without ischemic changes - continue crestor  and Coreg  -  Eliquis  in place of aspirin    HTN -  BP well controlled   CHB s/p PPM - followed by EP -EP assisting with paroxysmal atrial fibrillation On amiodarone   For questions or updates, please contact [facility] Please consult [email or web address] for contact info under     Signed, Vidya Bamford, MD  11/22/2024, 4:45 PM    "

## 2024-11-22 NOTE — Plan of Care (Signed)
  Problem: Education: Goal: Ability to describe self-care measures that may prevent or decrease complications (Diabetes Survival Skills Education) will improve Outcome: Progressing   Problem: Education: Goal: Individualized Educational Video(s) Outcome: Progressing   Problem: Coping: Goal: Ability to adjust to condition or change in health will improve Outcome: Progressing   Problem: Fluid Volume: Goal: Ability to maintain a balanced intake and output will improve Outcome: Progressing   Problem: Health Behavior/Discharge Planning: Goal: Ability to identify and utilize available resources and services will improve Outcome: Progressing
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
## 2024-11-22 NOTE — Progress Notes (Signed)
 Physical Therapy Treatment Patient Details Name: [name] MRN: [id number] DOB: 03-07-1935 Today's Date: 11/22/2024   History of Present Illness 89 y/o male presented to ED on 11/16/24 from heart failure clinic for B LE swelling and SOB. Admitted for acute on chronic systolic CHF and hypokalemia. PMH: PAF on Eliquis , CAD s/p CAD status post CABG x5 and multiple stents, ischemic cardiomyopathy (EF 25-30% 12/2023), pacemaker, CHF, back sx, and CKD, HTN, T2DM    PT Comments  Patient seen for PT session focused on bed mobility with attempt to further advance mobility but pt. Declined due to R knee pain. Patient required maxA x2 for rolling. Tolerated session poorly due to pain. Patient shows poor potential to make progress with continued acute level rehab. Patient continues to demonstrate severe activity restrictions and poor tolerance for progressive mobility. Continued skilled PT recommended to progress toward functional goals and support discharge readiness. Pt making good progress toward goals, will continue to follow POC. Discharge recommendation remains appropriate     If plan is discharge home, recommend the following: A lot of help with walking and/or transfers;A lot of help with bathing/dressing/bathroom;Assistance with cooking/housework;Direct supervision/assist for medications management;Direct supervision/assist for financial management;Assist for transportation;Help with stairs or ramp for entrance   Can travel by private vehicle     No  Equipment Recommendations  Other (comment)    Recommendations for Other Services       Precautions / Restrictions Precautions Precautions: Fall Recall of Precautions/Restrictions: Intact Restrictions Weight Bearing Restrictions Per Provider Order: No     Mobility  Bed Mobility Overal bed mobility: Needs Assistance Bed Mobility: Rolling Rolling: Max assist, +2 for physical assistance         General bed mobility comments: decreased  independence with bed mobility this date; limited ability to move RLE; assist with rolling provided to change bed padding; max encouragement provided to advance mobility pt. declined    Transfers                        Ambulation/Gait                   Stairs             Wheelchair Mobility     Tilt Bed    Modified Rankin (Stroke Patients Only)       Balance Overall balance assessment: Needs assistance Sitting-balance support: Feet supported, No upper extremity supported Sitting balance-[name]: Fair     Standing balance support: Bilateral upper extremity supported, During functional activity Standing balance-[name]: Poor                              Communication Communication Communication: Impaired Factors Affecting Communication: Reduced clarity of speech  Cognition Arousal: Alert Behavior During Therapy: WFL for tasks assessed/performed   PT - Cognitive impairments: No family/caregiver present to determine baseline                       PT - Cognition Comments: difficult to understand at times due to mumbling. Unable to state how many liters he wears at home. Following commands: Intact Following commands impaired: Follows one step commands with increased time    Cueing Cueing Techniques: Verbal cues, Tactile cues  Exercises      General Comments        Pertinent Vitals/Pain Pain Assessment Pain Assessment: Faces Faces Pain Scale: Hurts whole  lot Pain Location: R knee Pain Descriptors / Indicators: Discomfort, Grimacing Pain Intervention(s): Monitored during session, Limited activity within patient's tolerance    Home Living                          Prior Function            PT Goals (current goals can now be found in the care plan section) Acute Rehab PT Goals Patient Stated Goal: reduce pain PT Goal Formulation: With patient Time For Goal Achievement: 12/02/24 Potential to  Achieve Goals: Fair    Frequency    Min 2X/week      PT Plan      Co-evaluation   Reason for Co-Treatment: For patient/therapist safety;To address functional/ADL transfers PT goals addressed during session: Mobility/safety with mobility;Balance OT goals addressed during session: ADL's and self-care      AM-PAC PT 6 Clicks Mobility   Outcome Measure  Help needed turning from your back to your side while in a flat bed without using bedrails?: A Little Help needed moving from lying on your back to sitting on the side of a flat bed without using bedrails?: A Little Help needed moving to and from a bed to a chair (including a wheelchair)?: A Little Help needed standing up from a chair using your arms (e.g., wheelchair or bedside chair)?: A Little Help needed to walk in hospital room?: A Lot Help needed climbing 3-5 steps with a railing? : A Lot 6 Click Score: 16    End of Session   Activity Tolerance: Patient limited by fatigue;Patient limited by pain Patient left: in bed;with call bell/phone within reach Nurse Communication: Mobility status PT Visit Diagnosis: Unsteadiness on feet (R26.81);Other abnormalities of gait and mobility (R26.89);Muscle weakness (generalized) (M62.81);Difficulty in walking, not elsewhere classified (R26.2)     Time: 8592-8562 PT Time Calculation (min) (ACUTE ONLY): 30 min  Charges:    $Therapeutic Activity: 8-22 mins PT General Charges $$ ACUTE PT VISIT: 1 Visit                     Duane Lesches DPT, PT     [name] 11/22/2024, 2:54 PM

## 2024-11-22 NOTE — Progress Notes (Signed)
 " PROGRESS NOTE   HPI was taken from Dr. Seena: [name] is a 89 y.o. male with medical history significant of hypertension, hyperlipidemia, GERD, diabetes, CAD status post CABG, paroxysmal atrial fibrillation, bradycardia, V-fib arrest, complete heart block, status post pacemaker/AICD, chronic systolic CHF, CKD 4, obesity, prostate cancer, gout, thrombocytopenia presenting with worsening edema and shortness of breath.   Patient with recurrent admissions for advanced CHF.  Most recently was admitted to [facility] from 12/26 until 12/30.  Repeat echocardiogram there showed EF 20% (previously 25-30%).  Diuresed with improvement.  Was discharged on torsemide  twice daily and his spironolactone , Entresto , Farxiga  was held.   Followed up with the heart failure clinic today found to have worsening edema and shortness of breath despite taking torsemide  alternating 40 and 60 mg.  Patient sent to the ED for IV diuresis.   Patient denies fevers, chills, abdominal pain, constipation, diarrhea, nausea, vomiting.   ED Course: Vital signs in the ED stable.   Lab workup included BMP with potassium 3.3, BUN 57, creatinine stable at 2.29, glucose 129, calcium  8.2.  CBC with leukopenia 3.1 and platelets stable at 101.  proBNP elevated to greater than 35,000.  Troponin 109 on first check, repeat pending.   Chest x-ray with minimal basilar atelectasis with small pleural effusion.   Patient received 80 mg IV Lasix  in the ED  1/14: Complains of right knee pain.  X-ray showed severe DJD.  Palliative care consult 1/15:  diclofenac  gel for right knee.  Orthopedic consult to see if intra-articular injection is possible.  Discontinue morphine  anticipation of discharge tomorrow   [name]  FMW:[id number] DOB: January 24, 1935 DOA: 11/16/2024 PCP: [name], MD  Assessment & Plan:   Principal Problem:   Acute on chronic systolic (congestive) heart failure (HCC) Active Problems:   Acute on chronic HFrEF (heart  failure with reduced ejection fraction) (HCC)   CAD, s/p CABG x 5   Type 2 diabetes mellitus with hyperlipidemia (HCC)   HTN (hypertension)   HLD (hyperlipidemia)   Paroxysmal atrial fibrillation (HCC)   GERD without esophagitis   CKD (chronic kidney disease) stage 4, GFR 15-29 ml/min (HCC)   Thrombocytopenia   Obesity (BMI 30-39.9)   History of sustained ventricular fibrillation   History of cardiac arrest   S/P CABG x 5   Complete heart block s/p pacemaker (HCC)   Acute on chronic congestive heart failure (HCC)   Cardiac resynchronization therapy pacemaker (CRT-P) in place   Bradycardia   History of prostate cancer   Leg swelling  Assessment and Plan:  Acute on chronic systolic CHF: continue on IV lasix  120mg  BID as per cardio. Monitor I/Os. Fluid restriction. Last echo in October with EF 25-30%, indeterminate diastolic function, mildly reduced RV function, large pericardial effusion.  Repeat echocardiogram at [facility] on 12/26 showed EF reduced to 20%. Continue on coreg , entresto , aldactone  as per cardio.  - Continue additional 24 hours of Lasix  infusion and transition to torsemide  60 mg daily possible tomorrow per cardiology   Right knee pain due to severe tricompartmental knee DJD Confirmed on x-ray Started diclofenac  gel Continue icing, elevation and compression Seen by Ortho.  Recommend outpatient follow-up for intra-articular steroid injection.  Hypokalemia, hypomagnesemia: Repleted and resolved  HTN: continue on aldactone , coreg , lasix , entresto    HLD: continue on statin    GERD: continue on PPI   DM2: fair control, HbA1c 7.3 in 06/2024. Continue on SSI w/ accuchecks    Hx of CAD: s/p CABG. Continue on aldactone ,  coreg , entresto , statin    PAF: continue on eliquis , coreg       Hx of V-fib: as well as complete heart block. S/p pacemaker    CKDIV: Stable   Obesity: BMI 36.4. Would benefit from weight loss    Hx of prostate cancer: management per [facility] oncology     Gout: not taking any meds for this currently.  Not a candidate for colchicine  per pharmacy, uric acid 13.2   Thrombocytopenia: labile. Will continue to monitor   Generalized weakness: PT/OT recs SNF but pt adamantly refuses SNF    DVT prophylaxis: eliquis  Code Status: full  Family Communication:  Disposition Plan: PT/OT recs SNF but pt is refusing SNF so will likely d/c back home tomorrow with home health  Level of care: Progressive  Status is: Inpatient Remains inpatient appropriate because: severity of illness    Consultants:  Cardio    Subjective: Pt c/o right knee pain, somewhat sleepy  Objective: Vitals:   11/21/24 1642 11/22/24 0500 11/22/24 0905 11/22/24 1227  BP: 116/66  (!) 116/57 (!) 117/59  Pulse: 63  66 71  Resp: 19   18  Temp: (!) 97.5 F (36.4 C)   98.9 F (37.2 C)  TempSrc:      SpO2: 100%  95% 94%  Weight:  63.3 kg    Height:        Intake/Output Summary (Last 24 hours) at 11/22/2024 1710 Last data filed at 11/22/2024 1100 Gross per 24 hour  Intake 0 ml  Output 2550 ml  Net -2550 ml   Filed Weights   11/20/24 0449 11/21/24 0507 11/22/24 0500  Weight: 65.5 kg 63.3 kg 63.3 kg    Examination:  General exam: Appears calm but uncomfortable  Respiratory system: course breath sounds b/l  Cardiovascular system: S1/S2+. B/L trace LE pitting edema  Gastrointestinal system: abd is soft, benign Musculoskeletal mild tenderness over the patella area of right knee Central nervous system: alert & oriented. Moves all extremities   Psychiatry: judgement and insight appears at baseline. Flat mood and affect     Data Reviewed: I have personally reviewed following labs and imaging studies  CBC: Recent Labs  Lab 11/17/24 1448 11/18/24 0230 11/19/24 0434 11/20/24 1025 11/22/24 0402  WBC 4.2 4.7 4.2 5.6 6.6  HGB 12.4* 12.5* 12.8* 11.9* 13.1  HCT 40.2 40.2 41.2 37.8* 41.9  MCV 89.3 89.1 88.6 87.5 88.2  PLT 127* 133* 153 148* 175   Basic  Metabolic Panel: Recent Labs  Lab 11/16/24 1718 11/17/24 0108 11/18/24 0230 11/19/24 0434 11/20/24 1025 11/21/24 0407 11/22/24 0402  NA  --    < > 143 143 139 139 137  K  --    < > 3.7 3.9 3.9 4.2 4.1  CL  --    < > 102 100 98 98 94*  CO2  --    < > 32 33* 30 32 30  GLUCOSE  --    < > 111* 101* 147* 144* 105*  BUN  --    < > 56* 52* 54* 58* 59*  CREATININE  --    < > 2.17* 2.16* 2.38* 2.60* 2.60*  CALCIUM   --    < > 8.2* 8.1* 8.3* 8.4* 8.7*  MG 1.7  --   --  1.7  --  1.8  --    < > = values in this interval not displayed.   GFR: Estimated Creatinine Clearance: 17.9 mL/min (A) (by C-G formula based on SCr of 2.6 mg/dL (  H)). Liver Function Tests: Recent Labs  Lab 11/17/24 0108  AST 23  ALT 15  ALKPHOS 105  BILITOT 0.4  PROT 6.7  ALBUMIN  3.4*    Recent Labs    10/10/24 1058 10/15/24 1935 11/16/24 1519  PROBNP 24,507.0* >35,000.0* >35,000.0*   HbA1C: No results for input(s): HGBA1C in the last 72 hours. CBG: Recent Labs  Lab 11/21/24 1227 11/21/24 1736 11/21/24 2123 11/22/24 0846 11/22/24 1230  GLUCAP 161* 130* 109* 112* 135*      Scheduled Meds:  amiodarone   200 mg Oral BID   apixaban   2.5 mg Oral BID   carvedilol   6.25 mg Oral BID WC   diclofenac  Sodium  4 g Topical QID   guaiFENesin   600 mg Oral BID   insulin  aspart  0-15 Units Subcutaneous TID WC   insulin  aspart  0-5 Units Subcutaneous QHS   pantoprazole   40 mg Oral BID   rosuvastatin   20 mg Oral Daily   sacubitril -valsartan   1 tablet Oral BID   sodium chloride  flush  3 mL Intravenous Q12H   spironolactone   25 mg Oral Daily   Continuous Infusions:  furosemide  120 mg (11/22/24 0919)   magnesium  sulfate bolus IVPB Stopped (11/19/24 1358)     LOS: 6 days   Time spent 35 minutes  Danyele Smejkal Maree, MD [facility] Pager [phone]  If 7PM-7AM, please contact night-coverage [email or web address] 11/22/2024, 5:10 PM   "

## 2024-11-22 NOTE — Plan of Care (Signed)

## 2024-11-22 NOTE — Consult Note (Signed)
 "                                                                ORTHOPAEDIC CONSULTATION  REQUESTING PHYSICIAN: Maree Hue, MD  Chief Complaint:   Right knee pain  History of Present Illness: [name] is a 89 y.o. male with a past medical history of diabetes, CKD, CAD, heart failure who was admitted to the hospital last week for worsening edema and shortness of breath.  Orthopedics has been consulted due to right knee pain.  This has been ongoing for many years.  Radiographs demonstrate severe tricompartmental knee arthritis.  She reports that he has had right knee pain chronically.  He says that his knee pain currently is not necessarily any worse than it is at his baseline.  He typically uses a walker or a cane for ambulatory assistance but says he consistently has trouble putting weight on that knee due to the pain.  This has been ongoing for months if not years.  He takes arthritis medicine for the knee pain, which she says is minimally helpful.  He has received corticosteroid injections in the past, which she also says were minimally helpful.  He reports that he typically does not move the knee very much because it hurts.  WBC today is 6.6.  Uric acid is elevated at 13.2.  Past Medical History:  Diagnosis Date   ACS (acute coronary syndrome) (HCC) 06/03/2023   AV block, Mobitz 1    CAD (coronary artery disease)    a. 08/2019 VF arrest-->sev 3vd on cath-->CABGx5 (LIMA->LAD, VG->OM->LCX, VG->RPDA, VG->Diag); b. 08/2021 Cath: sev native multivessel dzs w/ 5/5 patent grafts. Nl filling pressures->Med rx; c. 05/2023 Cath: stable anatomy->Med rx.   CKD (chronic kidney disease), stage III (HCC)    COVID-19 12/23/2023   Diabetes mellitus without complication (HCC)    Elevated lactic acid level 10/16/2024   Elevated troponin 03/29/2022   HFrEF (heart failure with reduced ejection fraction) (HCC)    a. 08/2020 Echo: EF 40-45%; b. 11/2020 Echo: EF 55%, Gr2 DD; c. 08/2021 Echo: EF 35-40%, glob HK  w/ ant/antsept/apical HK; d. 10/2021 s/p CRT-P; e. 12/2021 Echo: EF 30-35%, glob HK, GrIII DD; f. 12/2022 Echo: EF 30-35%, glob HK, sev HK of ant wall, mod asymm LVH, nl RV fxn, mildly dil LA, triv MR, AoV sclerosis.   Hyperlipidemia LDL goal <70    Hypertension    Hypertensive urgency 03/29/2022   Hypothermia 10/16/2024   Intermittent Complete heart block (HCC)    a. 08/2021 noted on Zio; b. 10/2021 s/p Abbott Allure RF CRT-P (Ser # [id number]).   Ischemic cardiomyopathy    a. 08/2020 Echo: EF 40-45%; b. 11/2020 Echo: EF 55%; c. 08/2021 Echo: EF 35-40%; d. 10/2021 s/p CRT-P; e. 12/2021 Echo: EF 30-35%, glob HK, GrIII DD.   Malignant neoplasm of prostate (HCC) 10/05/2005   Myocardial injury 10/16/2024   Non-ST elevation (NSTEMI) myocardial infarction (HCC) 08/21/2020   PAF (paroxysmal atrial fibrillation) (HCC)    a.Medical device check-->Eliquis  (CHA2DS2VASc = 6)   Proctitis 10/16/2024   Status post percutaneous transluminal coronary angioplasty 10/08/2008   Trifascicular block    Past Surgical History:  Procedure Laterality Date   BACK SURGERY     CARDIAC CATHETERIZATION     CLIPPING  OF ATRIAL APPENDAGE N/A 08/29/2020   Procedure: CLIPPING OF ATRIAL APPENDAGE USING ATRICURE 45 MM ATRICLIP FLEX-V;  Surgeon: Army Dallas NOVAK, MD;  Location: [facility] OR;  Service: Open Heart Surgery;  Laterality: N/A;   CORONARY ARTERY BYPASS GRAFT N/A 08/29/2020   Procedure: CORONARY ARTERY BYPASS GRAFTING (CABG) X 5 USING LEFT INTERNAL MAMMARY ARTERY AND ENDOSCOPICALLY HARVESTED RIGHT GREATER SAPHENOUS VEIN. LIMA TO LAD, SVG TO OM SEQ TO CIRC, SVG TO PD, SVG TO DIAG.;  Surgeon: Army Dallas NOVAK, MD;  Location: [facility] OR;  Service: Open Heart Surgery;  Laterality: N/A;   ENDOVEIN HARVEST OF GREATER SAPHENOUS VEIN Right 08/29/2020   Procedure: ENDOVEIN HARVEST OF GREATER SAPHENOUS VEIN;  Surgeon: Army Dallas NOVAK, MD;  Location: [facility] OR;  Service: Open Heart Surgery;  Laterality: Right;   HERNIA REPAIR     LEFT HEART  CATH AND CORONARY ANGIOGRAPHY N/A 08/25/2020   Procedure: LEFT HEART CATH AND CORONARY ANGIOGRAPHY;  Surgeon: Darron Deatrice LABOR, MD;  Location: [facility] INVASIVE CV LAB;  Service: Cardiovascular;  Laterality: N/A;   PACEMAKER IMPLANT N/A 10/12/2021   Procedure: PACEMAKER IMPLANT;  Surgeon: Cindie Ole DASEN, MD;  Location: [facility] INVASIVE CV LAB;  Service: Cardiovascular;  Laterality: N/A;   RIGHT/LEFT HEART CATH AND CORONARY ANGIOGRAPHY N/A 08/31/2021   Procedure: RIGHT/LEFT HEART CATH AND CORONARY ANGIOGRAPHY;  Surgeon: Darron Deatrice LABOR, MD;  Location: [facility] INVASIVE CV LAB;  Service: Cardiovascular;  Laterality: N/A;   RIGHT/LEFT HEART CATH AND CORONARY/GRAFT ANGIOGRAPHY N/A 06/06/2023   Procedure: RIGHT/LEFT HEART CATH AND CORONARY/GRAFT ANGIOGRAPHY;  Surgeon: Darron Deatrice LABOR, MD;  Location: [facility] INVASIVE CV LAB;  Service: Cardiovascular;  Laterality: N/A;   TEE WITHOUT CARDIOVERSION N/A 08/29/2020   Procedure: TRANSESOPHAGEAL ECHOCARDIOGRAM (TEE);  Surgeon: Army Dallas NOVAK, MD;  Location[facility] OR;  Service: Open Heart Surgery;  Laterality: N/A;   Social History   Socioeconomic History   Marital status: Married    Spouse name: Not on file   Number of children: Not on file   Years of education: Not on file   Highest education level: Not on file  Occupational History   Not on file  Tobacco Use   Smoking status: Former    Current packs/day: 0.00    Types: Cigarettes    Quit date: 07/26/1996    Years since quitting: 28.3   Smokeless tobacco: Never  Vaping Use   Vaping status: Never Used  Substance and Sexual Activity   Alcohol use: Not Currently   Drug use: Never   Sexual activity: Yes  Other Topics Concern   Not on file  Social History Narrative   Not on file   Social Drivers of Health   Tobacco Use: Medium Risk (11/16/2024)   Patient History    Smoking Tobacco Use: Former    Smokeless Tobacco Use: Never    Passive Exposure: Not on file  Financial Resource Strain: Low Risk  (09/21/2024)   Received from [facility]   Overall Financial Resource Strain (CARDIA)    How hard is it for you to pay for the very basics like food, housing, medical care, and heating?: Not very hard  Food Insecurity: No Food Insecurity (11/18/2024)   Epic    Worried About Radiation Protection Practitioner of Food in the Last Year: Never true    Ran Out of Food in the Last Year: Never true  Transportation Needs: No Transportation Needs (11/18/2024)   Epic    Lack of Transportation (Medical): No    Lack of Transportation (  Non-Medical): No  Physical Activity: Not on file  Stress: Not on file  Social Connections: Moderately Integrated (11/18/2024)   Social Connection and Isolation Panel    Frequency of Communication with Friends and Family: More than three times a week    Frequency of Social Gatherings with Friends and Family: More than three times a week    Attends Religious Services: More than 4 times per year    Active Member of Clubs or Organizations: No    Attends Banker Meetings: Never    Marital Status: Married  Depression (PHQ2-9): Low Risk (04/07/2022)   Depression (PHQ2-9)    PHQ-2 Score: 0  Alcohol Screen: Not on file  Housing: Low Risk (11/18/2024)   Epic    Unable to Pay for Housing in the Last Year: No    Number of Times Moved in the Last Year: 0    Homeless in the Last Year: No  Utilities: Not At Risk (11/18/2024)   Epic    Threatened with loss of utilities: No  Health Literacy: Not on file   Family History  Problem Relation Age of Onset   Heart attack Mother    Allergies[1] Prior to Admission medications  Medication Sig Start Date End Date Taking? Authorizing Provider  acetaminophen  (TYLENOL ) 500 MG tablet Take 1,000 mg by mouth every 6 (six) hours as needed for mild pain, fever or headache.   Yes [provider]  acyclovir  (ZOVIRAX ) 800 MG tablet Take 800 mg by mouth daily.   Yes [provider]  albuterol  (VENTOLIN  HFA) 108 (90 Base) MCG/ACT  inhaler Inhale 2 puffs into the lungs every 6 (six) hours as needed for wheezing or shortness of breath. 09/25/24 09/25/25 Yes [provider]  apixaban  (ELIQUIS ) 2.5 MG TABS tablet TAKE 1 TABLET BY MOUTH TWICE A DAY 07/24/24  Yes Terra Fairy PARAS, PA-C  carvedilol  (COREG ) 12.5 MG tablet Take 1 tablet (12.5 mg total) by mouth 2 (two) times daily with a meal. Patient taking differently: Take 6.25 mg by mouth 2 (two) times daily with a meal. 10/09/24  Yes Donette [place] A, FNP  ezetimibe  (ZETIA ) 10 MG tablet TAKE 1 TABLET BY MOUTH EVERY DAY 06/29/24  Yes Rolan Ezra RAMAN, MD  magnesium  oxide (MAG-OX) 400 (240 Mg) MG tablet Take 1 tablet by mouth daily. 11/07/24  Yes [provider]  nitroGLYCERIN  (NITROSTAT ) 0.4 MG SL tablet Place 0.4 mg under the tongue every 5 (five) minutes as needed for chest pain. 09/25/24  Yes [provider]  pantoprazole  (PROTONIX ) 40 MG tablet Take 1 tablet (40 mg total) by mouth 2 (two) times daily. Patient taking differently: Take 40 mg by mouth daily as needed. 09/17/24  Yes Amin, Sumayya, MD  polyethylene glycol (MIRALAX  / GLYCOLAX ) 17 g packet Take 17 g by mouth daily as needed for moderate constipation. 12/19/22  Yes Wieting, Richard, MD  tamsulosin  (FLOMAX ) 0.4 MG CAPS capsule Take 0.4 mg by mouth daily.   Yes [provider]  torsemide  (DEMADEX ) 20 MG tablet Take 40-60 mg by mouth daily. Alternating days 40 mg and 60 mg 07/11/24  Yes [provider]  dapagliflozin  propanediol (FARXIGA ) 10 MG TABS tablet Take 1 tablet (10 mg total) by mouth daily. Patient not taking: Reported on 11/16/2024 12/27/23   Awanda City, MD  diclofenac  Sodium (VOLTAREN ) 1 % GEL Apply 2 g topically 4 (four) times daily. 09/21/24 09/21/25  [provider]  hydrALAZINE  (APRESOLINE ) 25 MG tablet Take 25 mg by mouth 3 (three)  times daily. Patient not taking: Reported on 11/16/2024 12/26/23   [provider]  Insulin  Glargine (BASAGLAR  KWIKPEN) 100  UNIT/ML Inject 15 Units into the skin at bedtime. Patient not taking: Reported on 11/16/2024 02/19/24 02/18/25  [provider]  metolazone  (ZAROXOLYN ) 2.5 MG tablet Take 2.5 mg by mouth 2 (two) times a week. Every Tuesday and Friday Patient not taking: Reported on 11/16/2024    [provider]  potassium chloride  SA (KLOR-CON  M) 20 MEQ tablet Take 2 tablets (40 mEq total) by mouth daily. Patient not taking: Reported on 11/16/2024 10/09/24   [place]ette [place] A, FNP  rosuvastatin  (CRESTOR ) 40 MG tablet Take 1 tablet (40 mg total) by mouth daily. Patient not taking: Reported on 10/16/2024 05/25/23   Gerard Frederick, NP  sacubitril -valsartan  (ENTRESTO ) 49-51 MG Take 1 tablet by mouth 2 (two) times daily. Patient not taking: Reported on 11/16/2024 10/09/24   Donette City LABOR, FNP  spironolactone  (ALDACTONE ) 25 MG tablet Take 1 tablet (25 mg total) by mouth daily. Patient not taking: Reported on 11/16/2024 04/19/24   Donette City LABOR, FNP   Recent Labs    11/20/24 1025 11/21/24 0407 11/22/24 0402  WBC 5.6  --  6.6  HGB 11.9*  --  13.1  HCT 37.8*  --  41.9  PLT 148*  --  175  K 3.9 4.2 4.1  CL 98 98 94*  CO2 30 32 30  BUN 54* 58* 59*  CREATININE 2.38* 2.60* 2.60*  GLUCOSE 147* 144* 105*  CALCIUM  8.3* 8.4* 8.7*   DG Knee Complete 4 Views Right Result Date: 11/21/2024 CLINICAL DATA:  Right knee pain and swelling EXAM: RIGHT KNEE - COMPLETE 4+ VIEW COMPARISON:  None Available. FINDINGS: No evidence of fracture or dislocation. Moderate size suprapatellar joint effusion is noted. Severe narrowing of medial joint space is noted. Mild osteophyte formation is noted laterally. Mild narrowing and osteophyte formation of patellofemoral space is noted. Soft tissues are unremarkable. IMPRESSION: Tricompartmental degenerative joint disease as described above. Moderate size suprapatellar joint effusion. No fracture or dislocation. Electronically Signed   By: Lynwood Landy Raddle M.D.   On: 11/21/2024 15:42    Positive ROS: All other systems have been reviewed and were otherwise negative with the exception of those mentioned in the HPI and as above.  Physical Exam: BP (!) 117/59 (BP Location: Right Arm)   Pulse 71   Temp 98.9 F (37.2 C)   Resp 18   Ht 5' 8 (1.727 m)   Wt 63.3 kg   SpO2 94%   BMI 21.22 kg/m  General:  Alert, no acute distress Psychiatric:  Patient is competent for consent with normal mood and affect    Orthopedic Exam:  RLE: Pain to palpation throughout the medial, lateral, anterior aspects of the knee but this is symmetric with the contralateral side.  He has some moderate swelling about the knee which is also consistent with the contralateral side.  The knee is not particular warm to touch.  He has minimal range of motion of the knee but when distracted he is able to demonstrate more active flexion and extension of the knee.  He tolerates approximately 20 to 60 degrees of range of motion of the knee, which is uncomfortable but tolerable.  He is grossly neurovascularly intact distally  Imaging:  As above: Severe tricompartmental knee arthritis  Assessment/Plan: Right knee arthritis - Icing, elevation, compression - Anti-inflammatories if not medically contraindicated, otherwise acetaminophen  or other oral pain relievers - Diclofenac  gel -  PT/OT - Mobilize/ work on range of motion as tolerated - Low concern for septic arthritis of the knee; his knee pain is at his normal baseline per him and he reports he typically does not move the knee, likely resulting in fairly significant stiffness, contributing to his pain with range of motion of the knee.  Additionally, he does not have an elevated white count or other systemic signs of infection.  He has an elevated uric acid so it is possible that he is having issues with gout in addition to the osteoarthritis - We will arrange close outpatient follow-up; patient would likely benefit from an intra-articular corticosteroid  injection   Jackquline CANDIE Barrack, MD Orthopaedic Surgery [facility]      [1]  Allergies Allergen Reactions   Angiotensin Receptor Blockers     hyperkalemia   Metformin Diarrhea   "

## 2024-11-23 ENCOUNTER — Other Ambulatory Visit: Payer: Self-pay

## 2024-11-23 DIAGNOSIS — I509 Heart failure, unspecified: Secondary | ICD-10-CM

## 2024-11-23 DIAGNOSIS — I442 Atrioventricular block, complete: Secondary | ICD-10-CM | POA: Diagnosis not present

## 2024-11-23 DIAGNOSIS — I5023 Acute on chronic systolic (congestive) heart failure: Secondary | ICD-10-CM | POA: Diagnosis not present

## 2024-11-23 DIAGNOSIS — I2510 Atherosclerotic heart disease of native coronary artery without angina pectoris: Secondary | ICD-10-CM | POA: Diagnosis not present

## 2024-11-23 DIAGNOSIS — Z950 Presence of cardiac pacemaker: Secondary | ICD-10-CM | POA: Diagnosis not present

## 2024-11-23 DIAGNOSIS — M7989 Other specified soft tissue disorders: Secondary | ICD-10-CM | POA: Diagnosis not present

## 2024-11-23 DIAGNOSIS — I10 Essential (primary) hypertension: Secondary | ICD-10-CM | POA: Diagnosis not present

## 2024-11-23 DIAGNOSIS — I480 Paroxysmal atrial fibrillation: Secondary | ICD-10-CM | POA: Diagnosis not present

## 2024-11-23 DIAGNOSIS — Z515 Encounter for palliative care: Secondary | ICD-10-CM

## 2024-11-23 DIAGNOSIS — I255 Ischemic cardiomyopathy: Secondary | ICD-10-CM | POA: Diagnosis not present

## 2024-11-23 DIAGNOSIS — Z951 Presence of aortocoronary bypass graft: Secondary | ICD-10-CM | POA: Diagnosis not present

## 2024-11-23 LAB — GLUCOSE, CAPILLARY
Glucose-Capillary: 117 mg/dL — ABNORMAL HIGH (ref 70–99)
Glucose-Capillary: 204 mg/dL — ABNORMAL HIGH (ref 70–99)

## 2024-11-23 MED ORDER — TORSEMIDE 20 MG PO TABS
80.0000 mg | ORAL_TABLET | Freq: Every day | ORAL | 0 refills | Status: DC
  Filled 2024-11-23: qty 120, 30d supply, fill #0

## 2024-11-23 MED ORDER — CARVEDILOL 6.25 MG PO TABS
6.2500 mg | ORAL_TABLET | Freq: Two times a day (BID) | ORAL | 0 refills | Status: DC
  Filled 2024-11-23: qty 60, 30d supply, fill #0

## 2024-11-23 MED ORDER — AMIODARONE HCL 200 MG PO TABS
200.0000 mg | ORAL_TABLET | Freq: Two times a day (BID) | ORAL | 0 refills | Status: AC
  Filled 2024-11-23: qty 60, 30d supply, fill #0

## 2024-11-23 MED ORDER — SPIRONOLACTONE 25 MG PO TABS
25.0000 mg | ORAL_TABLET | Freq: Every day | ORAL | 0 refills | Status: AC
  Filled 2024-11-23: qty 30, 30d supply, fill #0

## 2024-11-23 MED ORDER — ROSUVASTATIN CALCIUM 20 MG PO TABS
20.0000 mg | ORAL_TABLET | Freq: Every day | ORAL | 0 refills | Status: AC
  Filled 2024-11-23: qty 30, 30d supply, fill #0

## 2024-11-23 MED ORDER — TORSEMIDE 20 MG PO TABS
80.0000 mg | ORAL_TABLET | Freq: Every day | ORAL | Status: DC
  Administered 2024-11-23: 80 mg via ORAL
  Filled 2024-11-23: qty 4

## 2024-11-23 MED ORDER — SACUBITRIL-VALSARTAN 24-26 MG PO TABS
1.0000 | ORAL_TABLET | Freq: Two times a day (BID) | ORAL | 0 refills | Status: AC
  Filled 2024-11-23: qty 60, 30d supply, fill #0

## 2024-11-23 NOTE — TOC Progression Note (Addendum)
 Transition of Care [facility]) - Progression Note    Patient Details  Name: [name] MRN: [id number] Date of Birth: 25-May-1935  Transition of Care [facility]) CM/SW Contact  [name], RN Phone Number: 11/23/2024, 10:45 AM  Clinical Narrative:     Discussed DME recommendations with patient's wife. She declined a hospital bed due to limited space. Was in agreement for a 3 in 1. Said to use [employer] for equipment.   Jhonny Kins, daughter, and asked if she wanted it to come to the hospital room or be delivered. She opted for delivery.  Notified Mitch with [employer] of order. Narrative entered for co-signature. MD notified.  12:15 PM Notified by [employer] that patient already had BSC delivered in December. Called Lucienne, daughter, and advised. She said they did not realize they could use the [facility] in the shower.  Confirmed they do not need any additional equipment  2:37 PM RN and mobility specialist expressed concern about discharge plan due to patient weakness his status as a 2 person assist.  RNCM contacted [name] and expressed their concern. Lucienne said the family is in favor of him coming home and that she will have help getting him from the car into his wheelchair and inside the house.      Expected Discharge Plan: Home w Home Health Services Barriers to Discharge: Continued Medical Work up               Expected Discharge Plan and Services In-house Referral: Clinical Social Work                                             Social Drivers of Health (SDOH) Interventions SDOH Screenings   Food Insecurity: No Food Insecurity (11/18/2024)  Housing: Low Risk (11/18/2024)  Transportation Needs: No Transportation Needs (11/18/2024)  Utilities: Not At Risk (11/18/2024)  Depression (PHQ2-9): Low Risk (04/07/2022)  Financial Resource Strain: Low Risk (09/21/2024)   Received from [facility]  Social Connections: Moderately Integrated (11/18/2024)  Tobacco Use: Medium Risk  (11/16/2024)    Readmission Risk Interventions    11/19/2024    1:11 PM 10/17/2024   10:32 AM 03/26/2024    4:09 PM  Readmission Risk Prevention Plan  Transportation Screening Complete Complete Complete  Medication Review Oceanographer) Complete Complete Complete  PCP or Specialist appointment within 3-5 days of discharge -- Complete Complete  HRI or Home Care Consult Complete Complete Complete  SW Recovery Care/Counseling Consult  Complete Complete  Palliative Care Screening Not Applicable Not Applicable Not Applicable  Skilled Nursing Facility Complete Not Applicable Not Applicable

## 2024-11-23 NOTE — Progress Notes (Signed)
 "  Rounding Note   Patient Name: [name] Date of Encounter: 11/23/2024  [facility] Cardiologist: Deatrice Cage, MD  Subjective No notable events overnight.  Reports breathing has improved.  Able to lay flat.  Scheduled Meds:  amiodarone   200 mg Oral BID   apixaban   2.5 mg Oral BID   carvedilol   6.25 mg Oral BID WC   diclofenac  Sodium  4 g Topical QID   guaiFENesin   600 mg Oral BID   insulin  aspart  0-15 Units Subcutaneous TID WC   insulin  aspart  0-5 Units Subcutaneous QHS   pantoprazole   40 mg Oral BID   rosuvastatin   20 mg Oral Daily   sacubitril -valsartan   1 tablet Oral BID   sodium chloride  flush  3 mL Intravenous Q12H   spironolactone   25 mg Oral Daily   Continuous Infusions:  furosemide  120 mg (11/22/24 1800)   magnesium  sulfate bolus IVPB Stopped (11/19/24 1358)   PRN Meds: acetaminophen  **OR** acetaminophen , chlorpheniramine-HYDROcodone , ipratropium-albuterol , oxyCODONE -acetaminophen , polyethylene glycol   Vital Signs  Vitals:   11/22/24 1757 11/22/24 2154 11/23/24 0500 11/23/24 0822  BP: (!) 111/59 (!) 93/57  111/68  Pulse: 71 64  68  Resp: 16 18  19   Temp: 97.7 F (36.5 C)   98.6 F (37 C)  TempSrc:      SpO2: 94% 95%  100%  Weight:   63.3 kg   Height:        Intake/Output Summary (Last 24 hours) at 11/23/2024 0935 Last data filed at 11/22/2024 1801 Gross per 24 hour  Intake --  Output 1550 ml  Net -1550 ml      11/23/2024    5:00 AM 11/22/2024    5:00 AM 11/21/2024    5:07 AM  Last 3 Weights  Weight (lbs) 139 lb 8.8 oz 139 lb 8.8 oz 139 lb 8.8 oz  Weight (kg) 63.3 kg 63.3 kg 63.3 kg      Telemetry Normal sinus rhythm- Personally Reviewed  ECG   - Personally Reviewed  Physical Exam  GEN: No acute distress.   Neck: No JVD Cardiac: RRR, no murmurs, rubs, or gallops.  Respiratory: Clear to auscultation bilaterally. GI: Soft, nontender, non-distended  MS: No edema; No deformity. Neuro:  Nonfocal  Psych: Normal affect,  lethargic  Labs High Sensitivity Troponin:  No results for input(s): TROPONINIHS in the last 720 hours.  Recent Labs  Lab 11/16/24 1718 11/17/24 0108 11/17/24 0338 11/18/24 0230 11/18/24 2012  TRNPT 105* 108* 118* 124* 110*       Chemistry Recent Labs  Lab 11/16/24 1718 11/17/24 0108 11/18/24 0230 11/19/24 0434 11/20/24 1025 11/21/24 0407 11/22/24 0402  NA  --  144   < > 143 139 139 137  K  --  3.6   < > 3.9 3.9 4.2 4.1  CL  --  102   < > 100 98 98 94*  CO2  --  31   < > 33* 30 32 30  GLUCOSE  --  165*   < > 101* 147* 144* 105*  BUN  --  58*   < > 52* 54* 58* 59*  CREATININE  --  2.42*   < > 2.16* 2.38* 2.60* 2.60*  CALCIUM   --  7.8*   < > 8.1* 8.3* 8.4* 8.7*  MG 1.7  --   --  1.7  --  1.8  --   PROT  --  6.7  --   --   --   --   --  ALBUMIN   --  3.4*  --   --   --   --   --   AST  --  23  --   --   --   --   --   ALT  --  15  --   --   --   --   --   ALKPHOS  --  105  --   --   --   --   --   BILITOT  --  0.4  --   --   --   --   --   GFRNONAA  --  25*   < > 29* 26* 23* 23*  ANIONGAP  --  11   < > 10 11 9 14    < > = values in this interval not displayed.    Lipids No results for input(s): CHOL, TRIG, HDL, LABVLDL, LDLCALC, CHOLHDL in the last 168 hours.  Hematology Recent Labs  Lab 11/19/24 0434 11/20/24 1025 11/22/24 0402  WBC 4.2 5.6 6.6  RBC 4.65 4.32 4.75  HGB 12.8* 11.9* 13.1  HCT 41.2 37.8* 41.9  MCV 88.6 87.5 88.2  MCH 27.5 27.5 27.6  MCHC 31.1 31.5 31.3  RDW 17.0* 16.9* 17.0*  PLT 153 148* 175   Thyroid No results for input(s): TSH, FREET4 in the last 168 hours.  BNP Recent Labs  Lab 11/16/24 1519  PROBNP >35,000.0*    DDimer No results for input(s): DDIMER in the last 168 hours.   Radiology  DG Knee Complete 4 Views Right Result Date: 11/21/2024 CLINICAL DATA:  Right knee pain and swelling EXAM: RIGHT KNEE - COMPLETE 4+ VIEW COMPARISON:  None Available. FINDINGS: No evidence of fracture or dislocation. Moderate size  suprapatellar joint effusion is noted. Severe narrowing of medial joint space is noted. Mild osteophyte formation is noted laterally. Mild narrowing and osteophyte formation of patellofemoral space is noted. Soft tissues are unremarkable. IMPRESSION: Tricompartmental degenerative joint disease as described above. Moderate size suprapatellar joint effusion. No fracture or dislocation. Electronically Signed   By: Lynwood [name] M.D.   On: 11/21/2024 15:42    Cardiac Studies   Patient Profile   Mr. [name] is an 89 year old man with history of chronic HFrEF, CAD, and PAF, admitted with acute on chronic HFrEF.   Assessment & Plan  Acute on chronic HFrEF -Frequent hospitalizations over the past 2 months, including admissions at [facility] -Recurrent diastolic and systolic CHF -Typically presents with leg swelling, abdominal distention, shortness of breath - Suspect symptoms exacerbated by paroxysmal atrial fibrillation with high burden 40% on pacer download - most recent echo 10/2024 [facility] showed LVEF 20% - Appears euvolemic today; start torsemide  80mg  every day for maintenance diuresis. Consider metolazone  2.5mg  once or twice per week based on response to PO torsemide  - Recommend we continue spironolactone  25mg  daily, Entresto  24-26mg BID, Coreg  6.25mg BID.  Hold off on SGLT-2 inhibitor given renal dysfunction. - High risk for recurrent admissions   Hypokalemia Stable 4.1   Paroxysmal Afib - Pacer check detailing 40% AF burden - Suspect may be contributing to frequent hospital admissions for CHF exacerbation acute CHF symptoms  started on amiodarone  200 twice daily for sinus rhythm maintenance, continue carvedilol  - Continue Eliquis  2.5mg  BID   CAD - most recent ischemic evaluation 05/2023 with [facility] showing patent grafts and stable moderate stenosis in the proximal SVG to right PDA -Minimally elevated nontrending troponin, no further ischemic workup needed - EKG without ischemic changes - continue  crestor  and Coreg  -  Eliquis  in place of aspirin    HTN - BP well controlled   CHB s/p PPM - followed by EP -EP assisting with paroxysmal atrial fibrillation On amiodarone   For questions or updates, please contact [facility] Please consult [email or web address] for contact info under   Cardiology will sign off now; please re-engage as needed   Signed, Caron Poser, MD  11/23/2024, 9:35 AM    "

## 2024-11-23 NOTE — Discharge Summary (Signed)
 " Physician Discharge Summary   Patient: [name] MRN: [id number] DOB: 1935/02/11  Admit date:     11/16/2024  Discharge date: 11/23/24  Discharge Physician: Cresencio Fairly   PCP: [name], MD   Recommendations at discharge:    F/up with outpt providers as requested  Discharge Diagnoses: Principal Problem:   Acute on chronic systolic (congestive) heart failure (HCC) Active Problems:   Acute on chronic HFrEF (heart failure with reduced ejection fraction) (HCC)   CAD, s/p CABG x 5   Type 2 diabetes mellitus with hyperlipidemia (HCC)   HTN (hypertension)   HLD (hyperlipidemia)   Paroxysmal atrial fibrillation (HCC)   GERD without esophagitis   CKD (chronic kidney disease) stage 4, GFR 15-29 ml/min (HCC)   Thrombocytopenia   Obesity (BMI 30-39.9)   History of sustained ventricular fibrillation   History of cardiac arrest   S/P CABG x 5   Complete heart block s/p pacemaker (HCC)   Acute on chronic congestive heart failure (HCC)   Cardiac resynchronization therapy pacemaker (CRT-P) in place   Bradycardia   History of prostate cancer   Leg swelling  Hospital Course: Assessment and Plan:  89 y.o. male with medical history significant of hypertension, hyperlipidemia, GERD, diabetes, CAD status post CABG, paroxysmal atrial fibrillation, bradycardia, V-fib arrest, complete heart block, status post pacemaker/AICD, chronic systolic CHF, CKD 4, obesity, prostate cancer, gout, thrombocytopenia presenting with worsening edema and shortness of breath. Patient with recurrent admissions for advanced CHF.  Most recently was admitted to [facility] from 12/26 until 12/30.  Repeat echocardiogram there showed EF 20% (previously 25-30%).  Diuresed with improvement.  Was discharged on torsemide  twice daily and his spironolactone , Entresto , Farxiga  was held. Followed up with the heart failure clinic and was found to have worsening edema and shortness of breath despite taking torsemide  alternating 40 and  60 mg.  Patient sent to the ED for IV diuresis.    ED Course: Lab workup included BMP with potassium 3.3, BUN 57, creatinine stable at 2.29, glucose 129, calcium  8.2.  CBC with leukopenia 3.1 and platelets stable at 101.  proBNP elevated to greater than 35,000.  Troponin 109, Chest x-ray with minimal basilar atelectasis with small pleural effusion. Patient received 80 mg IV Lasix  in the ED   1/14: Complains of right knee pain.  X-ray showed severe DJD.  Palliative care consult 1/15:  diclofenac  gel for right knee.  Orthopedic consult to see if intra-articular injection is possible.  Discontinue morphine  anticipation of discharge tomorrow   Acute on chronic HFrEF -Frequent hospitalizations over the past 2 months, including admissions at [facility] -Recurrent diastolic and systolic CHF -Typically presents with leg swelling, abdominal distention, shortness of breath - Suspect symptoms exacerbated by paroxysmal atrial fibrillation with high burden 40% on pacer download - most recent echo 10/2024 at [facility] showed LVEF 20% - Appears euvolemic today; discharging on torsemide  80mg  every day for maintenance diuresis.  - Continue spironolactone  25mg  daily, Entresto  24-26mg BID, Coreg  6.25mg BID at DC.  Hold off on SGLT-2 inhibitor given renal dysfunction. - High risk for recurrent admissions - close f/up with cardio and CHF clinic as an outpt   Right knee pain due to severe tricompartmental knee DJD Confirmed on x-ray diclofenac  gel Continue icing, elevation and compression Seen by Ortho Dr Ezra.  Recommend outpatient follow-up for intra-articular steroid injection.   Hypokalemia, hypomagnesemia: Repleted    HTN  HLD  GERD  DM2: HbA1c 7.3 in 06/2024.   Hx of CAD: s/p CABG.  PAF:and Hx of V-fib: as well as complete heart block. S/p pacemaker    CKDIV: Stable  Obesity: BMI 36.4.  Hx of prostate cancer: management per [facility] oncology  H/o of Gout: not taking any meds for this currently.  Not a candidate  for colchicine  per pharmacy, uric acid 13.2 Thrombocytopenia: chronic and stable   Generalized weakness: PT/OT recs SNF but pt adamantly refuses SNF             Consultants: Cardio, Ortho  Disposition: Home health Diet recommendation:  Carb modified diet DISCHARGE MEDICATION: Allergies as of 11/23/2024       Reactions   Angiotensin Receptor Blockers    hyperkalemia   Metformin Diarrhea        Medication List     STOP taking these medications    Basaglar  KwikPen 100 UNIT/ML   dapagliflozin  propanediol 10 MG Tabs tablet Commonly known as: Farxiga    hydrALAZINE  25 MG tablet Commonly known as: APRESOLINE    metolazone  2.5 MG tablet Commonly known as: ZAROXOLYN    potassium chloride  SA 20 MEQ tablet Commonly known as: KLOR-CON  M   sacubitril -valsartan  49-51 MG Commonly known as: ENTRESTO  Replaced by: sacubitril -valsartan  24-26 MG       TAKE these medications    acetaminophen  500 MG tablet Commonly known as: TYLENOL  Take 1,000 mg by mouth every 6 (six) hours as needed for mild pain, fever or headache.   acyclovir  800 MG tablet Commonly known as: ZOVIRAX  Take 800 mg by mouth daily.   albuterol  108 (90 Base) MCG/ACT inhaler Commonly known as: VENTOLIN  HFA Inhale 2 puffs into the lungs every 6 (six) hours as needed for wheezing or shortness of breath.   amiodarone  200 MG tablet Commonly known as: PACERONE  Take 1 tablet (200 mg total) by mouth 2 (two) times daily.   carvedilol  6.25 MG tablet Commonly known as: COREG  Take 1 tablet (6.25 mg total) by mouth 2 (two) times daily with a meal. What changed:  medication strength how much to take   diclofenac  Sodium 1 % Gel Commonly known as: VOLTAREN  Apply 2 g topically 4 (four) times daily.   Eliquis  2.5 MG Tabs tablet Generic drug: apixaban  TAKE 1 TABLET BY MOUTH TWICE A DAY   ezetimibe  10 MG tablet Commonly known as: ZETIA  TAKE 1 TABLET BY MOUTH EVERY DAY   magnesium  oxide 400 (240 Mg) MG  tablet Commonly known as: MAG-OX Take 1 tablet by mouth daily.   nitroGLYCERIN  0.4 MG SL tablet Commonly known as: NITROSTAT  Place 0.4 mg under the tongue every 5 (five) minutes as needed for chest pain.   pantoprazole  40 MG tablet Commonly known as: PROTONIX  Take 1 tablet (40 mg total) by mouth 2 (two) times daily. What changed:  when to take this reasons to take this   polyethylene glycol 17 g packet Commonly known as: MIRALAX  / GLYCOLAX  Take 17 g by mouth daily as needed for moderate constipation.   rosuvastatin  20 MG tablet Commonly known as: CRESTOR  Take 1 tablet (20 mg total) by mouth daily. Start taking on: November 24, 2024 What changed:  medication strength how much to take   sacubitril -valsartan  24-26 MG Commonly known as: ENTRESTO  Take 1 tablet by mouth 2 (two) times daily. Replaces: sacubitril -valsartan  49-51 MG   spironolactone  25 MG tablet Commonly known as: ALDACTONE  Take 1 tablet (25 mg total) by mouth daily. Start taking on: November 24, 2024   tamsulosin  0.4 MG Caps capsule Commonly known as: FLOMAX  Take 0.4 mg by mouth daily.  torsemide  20 MG tablet Commonly known as: DEMADEX  Take 4 tablets (80 mg total) by mouth daily. Start taking on: November 24, 2024 What changed:  how much to take additional instructions        Contact information for follow-up providers     [facility]. Go on 12/03/2024.   Specialty: Cardiology Why: Hospital Follow-Up 12/03/24 @ 9:30 AM Please bring all medications to follow-up appointment [facility], [address] Second Floor Free Valet Parking @ the door Contact information: [address] [place]  [address] [phone]        [name], MD. Schedule an appointment as soon as possible for a visit in 1 week(s).   Specialty: Internal Medicine Why: [facility] Discharge F/UP  Hospital follow-up: Jan. 28th @ 1:00 PM.  Lucrezia at 12:45 PM Bring your insurance card, list of medications and co- pay fees if needed. Contact information: [address] [place] [address] [phone]         Riddle, Suzann, NP. Schedule an appointment as soon as possible for a visit in 1 week(s).   Specialty: Cardiology Why: [facility] Discharge F/UP  Hospital Follow-Up:  Please follow up after discharge. Contact information: [address] [place] [address] [phone]         Kip Lynwood Double, PA-C. Schedule an appointment as soon as possible for a visit in 2 week(s).   Specialty: Physician Assistant Why: Post Hospital Discharge F/UP  Hospital Follow-Up: Please call and follow up after discharge. Contact information: [address] [place] [address] [phone]              Contact information for after-discharge care     Home Medical Care     [facility]) .   Service: Home Health Services Contact information: [address] [place]  [address] [phone]                    Discharge Exam: Fredricka Weights   11/21/24 0507 11/22/24 0500 11/23/24 0500  Weight: 63.3 kg 63.3 kg 63.3 kg   General exam: Appears calm but uncomfortable  Respiratory system: course breath sounds b/l  Cardiovascular system: S1/S2+. B/L trace LE pitting edema  Gastrointestinal system: abd is soft, benign Musculoskeletal mild tenderness over the patella area of right knee Central nervous system: alert & oriented. Moves all extremities   Psychiatry: judgement and insight appears at baseline. Flat mood and affect   Condition at discharge: fair  The results of significant diagnostics from this hospitalization (including imaging, microbiology, ancillary and laboratory) are listed below for reference.   Imaging Studies: DG Knee Complete 4 Views Right Result Date: 11/21/2024 CLINICAL DATA:  Right knee pain and swelling  EXAM: RIGHT KNEE - COMPLETE 4+ VIEW COMPARISON:  None Available. FINDINGS: No evidence of fracture or dislocation. Moderate size suprapatellar joint effusion is noted. Severe narrowing of medial joint space is noted. Mild osteophyte formation is noted laterally. Mild narrowing and osteophyte formation of patellofemoral space is noted. Soft tissues are unremarkable. IMPRESSION: Tricompartmental degenerative joint disease as described above. Moderate size suprapatellar joint effusion. No fracture or dislocation. Electronically Signed   By: Lynwood Landy Raddle M.D.   On: 11/21/2024 15:42   DG Chest 2 View Result Date: 11/16/2024 CLINICAL DATA:  Shortness of breath EXAM: CHEST - 2 VIEW COMPARISON:  October 15, 2024 FINDINGS: Stable cardiomegaly.  Status post coronary artery bypass graft. Left-sided pacemaker is unchanged. Minimal left basilar atelectasis is noted with small left pleural effusion. Minimal right basilar subsegmental atelectasis. Bony thorax is unremarkable. IMPRESSION: Minimal bibasilar subsegmental atelectasis with small left pleural effusion. Electronically Signed   By: Lynwood Landy Raddle M.D.   On: 11/16/2024 16:13    Microbiology: Results for orders placed or performed during the hospital encounter of 10/15/24  Resp panel by RT-PCR (RSV, Flu A&B, Covid) Anterior Nasal Swab     Status: None   Collection Time: 10/15/24  7:35 PM   Specimen: Anterior Nasal Swab  Result Value Ref Range Status   SARS Coronavirus 2 by RT PCR NEGATIVE NEGATIVE Final    Comment: (NOTE) SARS-CoV-2 target nucleic acids are NOT DETECTED.  The SARS-CoV-2 RNA is generally detectable in upper respiratory specimens during the acute phase of infection. The lowest concentration of SARS-CoV-2 viral copies this assay can detect is 138 copies/mL. A negative result does not preclude SARS-Cov-2 infection and should not be used as the sole basis for treatment or other patient management decisions. A negative result may occur with   improper specimen collection/handling, submission of specimen other than nasopharyngeal swab, presence of viral mutation(s) within the areas targeted by this assay, and inadequate number of viral copies(<138 copies/mL). A negative result must be combined with clinical observations, patient history, and epidemiological information. The expected result is Negative.  Fact Sheet for Patients:  bloggercourse.com  Fact Sheet for Healthcare Providers:  seriousbroker.it  This test is no t yet approved or cleared by the [place]  FDA and  has been authorized for detection and/or diagnosis of SARS-CoV-2 by FDA under an Emergency Use Authorization (EUA). This EUA will remain  in effect (meaning this test can be used) for the duration of the COVID-19 declaration under Section 564(b)(1) of the Act, 21 U.S.C.section 360bbb-3(b)(1), unless the authorization is terminated  or revoked sooner.       Influenza A by PCR NEGATIVE NEGATIVE Final   Influenza B by PCR NEGATIVE NEGATIVE Final    Comment: (NOTE) The Xpert Xpress SARS-CoV-2/FLU/RSV plus assay is intended as an aid in the diagnosis of influenza from Nasopharyngeal swab specimens and should not be used as a sole basis for treatment. Nasal washings and aspirates are unacceptable for Xpert Xpress SARS-CoV-2/FLU/RSV testing.  Fact Sheet for Patients: bloggercourse.com  Fact Sheet for Healthcare Providers: seriousbroker.it  This test is not yet approved or cleared by the [place]  FDA and has been authorized for detection and/or diagnosis of SARS-CoV-2 by FDA under an Emergency Use Authorization (EUA). This EUA will remain in effect (meaning this test can be used) for the duration of the COVID-19 declaration under Section 564(b)(1) of the Act, 21 U.S.C. section 360bbb-3(b)(1), unless the authorization is terminated or revoked.      Resp Syncytial Virus by PCR NEGATIVE NEGATIVE Final    Comment: (NOTE) Fact Sheet for Patients: bloggercourse.com  Fact Sheet for Healthcare Providers: seriousbroker.it  This test is not yet approved or cleared by the [place]  FDA and has been authorized for detection and/or diagnosis of SARS-CoV-2 by FDA under an Emergency Use Authorization (EUA). This EUA will remain in effect (meaning this test can be used) for the duration of the COVID-19 declaration under Section 564(b)(1) of the Act, 21 U.S.C. section 360bbb-3(b)(1), unless the authorization is terminated or revoked.  Performed at [facility], [address] [place], [place] [address]   Blood Culture (routine x 2)  Status: Abnormal   Collection Time: 10/15/24  7:41 PM   Specimen: BLOOD  Result Value Ref Range Status   Specimen Description   Final    BLOOD BLOOD LEFT FOREARM Performed at [facility], [address] [place], [place] [address]    Special Requests   Final    BOTTLES DRAWN AEROBIC AND ANAEROBIC Blood Culture results may not be optimal due to an inadequate volume of blood received in culture bottles Performed at [facility], [address] [place], [place] [address]    Culture  Setup Time   Final    GRAM POSITIVE COCCI ANAEROBIC BOTTLE ONLY CRITICAL RESULT CALLED TO, READ BACK BY AND VERIFIED WITH:  CATHALEEN PATEL AT 9664 10/18/24 JG    Culture (A)  Final    STAPHYLOCOCCUS CAPITIS THE SIGNIFICANCE OF ISOLATING THIS ORGANISM FROM A SINGLE SET OF BLOOD CULTURES WHEN MULTIPLE SETS ARE DRAWN IS UNCERTAIN. PLEASE NOTIFY THE MICROBIOLOGY DEPARTMENT WITHIN ONE WEEK IF SPECIATION AND SENSITIVITIES ARE REQUIRED. Performed at [facility], [address] [place], [place] [address]    Report Status 10/20/2024 FINAL  Final  Blood Culture (routine x 2)     Status: None   Collection Time: 10/15/24  7:41 PM   Specimen:  BLOOD  Result Value Ref Range Status   Specimen Description   Final    BLOOD BLOOD RIGHT FOREARM Performed at [facility], [address], [place], [place] [address]    Special Requests   Final    BOTTLES DRAWN AEROBIC AND ANAEROBIC Blood Culture results may not be optimal due to an inadequate volume of blood received in culture bottles Performed at [facility], [address], [place], [place] [address]    Culture  Setup Time   Final    GRAM NEGATIVE RODS AEROBIC BOTTLE ONLY Organism ID to follow CRITICAL RESULT CALLED TO, READ BACK BY AND VERIFIED WITH: WILL LENON 10/17/24 0814 MW Performed at [facility], [address], [place], [place] [address]    Culture   Final    VONNE NEGATIVE RODS SENT TO [facility] FOR ID AND SUSCEPTIBILITY SEE SEPARATE REPORT Performed at [facility], [address] [place], [place] [address]    Report Status 10/26/2024 FINAL  Final  Blood Culture ID Panel (Reflexed)     Status: None   Collection Time: 10/15/24  7:41 PM  Result Value Ref Range Status   Enterococcus faecalis NOT DETECTED NOT DETECTED Final   Enterococcus Faecium NOT DETECTED NOT DETECTED Final   Listeria monocytogenes NOT DETECTED NOT DETECTED Final   Staphylococcus species NOT DETECTED NOT DETECTED Final   Staphylococcus aureus (BCID) NOT DETECTED NOT DETECTED Final   Staphylococcus epidermidis NOT DETECTED NOT DETECTED Final   Staphylococcus lugdunensis NOT DETECTED NOT DETECTED Final   Streptococcus species NOT DETECTED NOT DETECTED Final   Streptococcus agalactiae NOT DETECTED NOT DETECTED Final   Streptococcus pneumoniae NOT DETECTED NOT DETECTED Final   Streptococcus pyogenes NOT DETECTED NOT DETECTED Final   A.calcoaceticus-baumannii NOT DETECTED NOT DETECTED Final   Bacteroides fragilis NOT DETECTED NOT DETECTED Final   Enterobacterales NOT DETECTED NOT DETECTED Final   Enterobacter cloacae complex NOT DETECTED NOT DETECTED Final    Escherichia coli NOT DETECTED NOT DETECTED Final   Klebsiella aerogenes NOT DETECTED NOT DETECTED Final   Klebsiella oxytoca NOT DETECTED NOT DETECTED Final   Klebsiella pneumoniae NOT DETECTED NOT DETECTED Final   Proteus species NOT DETECTED NOT DETECTED  Final   Salmonella species NOT DETECTED NOT DETECTED Final   Serratia marcescens NOT DETECTED NOT DETECTED Final   Haemophilus influenzae NOT DETECTED NOT DETECTED Final   Neisseria meningitidis NOT DETECTED NOT DETECTED Final   Pseudomonas aeruginosa NOT DETECTED NOT DETECTED Final   Stenotrophomonas maltophilia NOT DETECTED NOT DETECTED Final   Candida albicans NOT DETECTED NOT DETECTED Final   Candida auris NOT DETECTED NOT DETECTED Final   Candida glabrata NOT DETECTED NOT DETECTED Final   Candida krusei NOT DETECTED NOT DETECTED Final   Candida parapsilosis NOT DETECTED NOT DETECTED Final   Candida tropicalis NOT DETECTED NOT DETECTED Final   Cryptococcus neoformans/gattii NOT DETECTED NOT DETECTED Final    Comment: Performed at [facility], [address] [place], [place] [address]  Blood Culture ID Panel (Reflexed)     Status: Abnormal   Collection Time: 10/15/24  7:41 PM  Result Value Ref Range Status   Enterococcus faecalis NOT DETECTED NOT DETECTED Final   Enterococcus Faecium NOT DETECTED NOT DETECTED Final   Listeria monocytogenes NOT DETECTED NOT DETECTED Final   Staphylococcus species DETECTED (A) NOT DETECTED Final    Comment: CRITICAL RESULT CALLED TO, READ BACK BY AND VERIFIED WITH:  CATHALEEN BLANCH AT 0335 10/18/24 JG    Staphylococcus aureus (BCID) NOT DETECTED NOT DETECTED Final   Staphylococcus epidermidis NOT DETECTED NOT DETECTED Final   Staphylococcus lugdunensis NOT DETECTED NOT DETECTED Final   Streptococcus species NOT DETECTED NOT DETECTED Final   Streptococcus agalactiae NOT DETECTED NOT DETECTED Final   Streptococcus pneumoniae NOT DETECTED NOT DETECTED Final   Streptococcus pyogenes NOT  DETECTED NOT DETECTED Final   A.calcoaceticus-baumannii NOT DETECTED NOT DETECTED Final   Bacteroides fragilis NOT DETECTED NOT DETECTED Final   Enterobacterales NOT DETECTED NOT DETECTED Final   Enterobacter cloacae complex NOT DETECTED NOT DETECTED Final   Escherichia coli NOT DETECTED NOT DETECTED Final   Klebsiella aerogenes NOT DETECTED NOT DETECTED Final   Klebsiella oxytoca NOT DETECTED NOT DETECTED Final   Klebsiella pneumoniae NOT DETECTED NOT DETECTED Final   Proteus species NOT DETECTED NOT DETECTED Final   Salmonella species NOT DETECTED NOT DETECTED Final   Serratia marcescens NOT DETECTED NOT DETECTED Final   Haemophilus influenzae NOT DETECTED NOT DETECTED Final   Neisseria meningitidis NOT DETECTED NOT DETECTED Final   Pseudomonas aeruginosa NOT DETECTED NOT DETECTED Final   Stenotrophomonas maltophilia NOT DETECTED NOT DETECTED Final   Candida albicans NOT DETECTED NOT DETECTED Final   Candida auris NOT DETECTED NOT DETECTED Final   Candida glabrata NOT DETECTED NOT DETECTED Final   Candida krusei NOT DETECTED NOT DETECTED Final   Candida parapsilosis NOT DETECTED NOT DETECTED Final   Candida tropicalis NOT DETECTED NOT DETECTED Final   Cryptococcus neoformans/gattii NOT DETECTED NOT DETECTED Final    Comment: Performed at [facility], [address], [place], [place] [address]  Urine Culture     Status: Abnormal   Collection Time: 10/15/24  9:29 PM   Specimen: Urine, Random  Result Value Ref Range Status   Specimen Description   Final    URINE, RANDOM Performed at [facility], [address], [place], [place] [address]    Special Requests   Final    NONE Reflexed from [id number] Performed at [facility], [address] [place], [place] [address]    Culture >=100,000 COLONIES/mL ESCHERICHIA COLI (A)  Final   Report Status 10/18/2024 FINAL  Final   Organism ID, Bacteria ESCHERICHIA  COLI (A)  Final      Susceptibility   Escherichia  coli - MIC*    AMPICILLIN  >=32 RESISTANT Resistant     CEFAZOLIN  (URINE) Value in next row Sensitive      8 SENSITIVEThis is a modified FDA-approved test that has been validated and its performance characteristics determined by the reporting laboratory.  This laboratory is certified under the Clinical Laboratory Improvement Amendments CLIA as qualified to perform high complexity clinical laboratory testing.    CEFEPIME  Value in next row Sensitive      8 SENSITIVEThis is a modified FDA-approved test that has been validated and its performance characteristics determined by the reporting laboratory.  This laboratory is certified under the Clinical Laboratory Improvement Amendments CLIA as qualified to perform high complexity clinical laboratory testing.    ERTAPENEM Value in next row Sensitive      8 SENSITIVEThis is a modified FDA-approved test that has been validated and its performance characteristics determined by the reporting laboratory.  This laboratory is certified under the Clinical Laboratory Improvement Amendments CLIA as qualified to perform high complexity clinical laboratory testing.    CEFTRIAXONE  Value in next row Sensitive      8 SENSITIVEThis is a modified FDA-approved test that has been validated and its performance characteristics determined by the reporting laboratory.  This laboratory is certified under the Clinical Laboratory Improvement Amendments CLIA as qualified to perform high complexity clinical laboratory testing.    CIPROFLOXACIN  Value in next row Resistant      8 SENSITIVEThis is a modified FDA-approved test that has been validated and its performance characteristics determined by the reporting laboratory.  This laboratory is certified under the Clinical Laboratory Improvement Amendments CLIA as qualified to perform high complexity clinical laboratory testing.    GENTAMICIN  Value in next row Sensitive      8 SENSITIVEThis is a modified FDA-approved test that has been  validated and its performance characteristics determined by the reporting laboratory.  This laboratory is certified under the Clinical Laboratory Improvement Amendments CLIA as qualified to perform high complexity clinical laboratory testing.    NITROFURANTOIN Value in next row Sensitive      8 SENSITIVEThis is a modified FDA-approved test that has been validated and its performance characteristics determined by the reporting laboratory.  This laboratory is certified under the Clinical Laboratory Improvement Amendments CLIA as qualified to perform high complexity clinical laboratory testing.    TRIMETH/SULFA Value in next row Resistant      8 SENSITIVEThis is a modified FDA-approved test that has been validated and its performance characteristics determined by the reporting laboratory.  This laboratory is certified under the Clinical Laboratory Improvement Amendments CLIA as qualified to perform high complexity clinical laboratory testing.    AMPICILLIN /SULBACTAM Value in next row Resistant      8 SENSITIVEThis is a modified FDA-approved test that has been validated and its performance characteristics determined by the reporting laboratory.  This laboratory is certified under the Clinical Laboratory Improvement Amendments CLIA as qualified to perform high complexity clinical laboratory testing.    PIP/TAZO Value in next row Sensitive      <=4 SENSITIVEThis is a modified FDA-approved test that has been validated and its performance characteristics determined by the reporting laboratory.  This laboratory is certified under the Clinical Laboratory Improvement Amendments CLIA as qualified to perform high complexity clinical laboratory testing.    MEROPENEM Value in next row Sensitive      <=4 SENSITIVEThis is a modified FDA-approved test that has  been validated and its performance characteristics determined by the reporting laboratory.  This laboratory is certified under the Clinical Laboratory Improvement  Amendments CLIA as qualified to perform high complexity clinical laboratory testing.    * >=100,000 COLONIES/mL ESCHERICHIA COLI  Culture, blood (Routine X 2) w Reflex to ID Panel     Status: None   Collection Time: 10/18/24 10:33 AM   Specimen: BLOOD  Result Value Ref Range Status   Specimen Description BLOOD BLOOD RIGHT HAND  Final   Special Requests   Final    BOTTLES DRAWN AEROBIC AND ANAEROBIC Blood Culture adequate volume   Culture   Final    NO GROWTH 5 DAYS Performed at [facility], [address] [place], [place] [address]    Report Status 10/23/2024 FINAL  Final  Culture, blood (Routine X 2) w Reflex to ID Panel     Status: None   Collection Time: 10/18/24 10:33 AM   Specimen: BLOOD  Result Value Ref Range Status   Specimen Description BLOOD BLOOD LEFT HAND  Final   Special Requests   Final    BOTTLES DRAWN AEROBIC AND ANAEROBIC Blood Culture adequate volume   Culture   Final    NO GROWTH 5 DAYS Performed at [facility], [address] [place], [place] [address]    Report Status 10/23/2024 FINAL  Final    Labs: CBC: Recent Labs  Lab 11/17/24 1448 11/18/24 0230 11/19/24 0434 11/20/24 1025 11/22/24 0402  WBC 4.2 4.7 4.2 5.6 6.6  HGB 12.4* 12.5* 12.8* 11.9* 13.1  HCT 40.2 40.2 41.2 37.8* 41.9  MCV 89.3 89.1 88.6 87.5 88.2  PLT 127* 133* 153 148* 175   Basic Metabolic Panel: Recent Labs  Lab 11/18/24 0230 11/19/24 0434 11/20/24 1025 11/21/24 0407 11/22/24 0402  NA 143 143 139 139 137  K 3.7 3.9 3.9 4.2 4.1  CL 102 100 98 98 94*  CO2 32 33* 30 32 30  GLUCOSE 111* 101* 147* 144* 105*  BUN 56* 52* 54* 58* 59*  CREATININE 2.17* 2.16* 2.38* 2.60* 2.60*  CALCIUM  8.2* 8.1* 8.3* 8.4* 8.7*  MG  --  1.7  --  1.8  --    Liver Function Tests: Recent Labs  Lab 11/17/24 0108  AST 23  ALT 15  ALKPHOS 105  BILITOT 0.4  PROT 6.7  ALBUMIN  3.4*   CBG: Recent Labs  Lab 11/22/24 1230 11/22/24 1753 11/22/24 2127 11/23/24 0824  11/23/24 1156  GLUCAP 135* 152* 231* 117* 204*    Discharge time spent: greater than 30 minutes.  Signed: Cresencio Fairly, MD [facility] 11/23/2024 "

## 2024-11-23 NOTE — TOC Transition Note (Signed)
 Transition of Care [facility]) - Discharge Note   Patient Details  Name: [name] MRN: [id number] Date of Birth: 12-17-1934  Transition of Care [facility]) CM/SW Contact:  [name], RN Phone Number: 11/23/2024, 2:41 PM   Clinical Narrative:    Patient has discharge orders. Family is providing transport. Confirmed that there will be help available at home to assist getting him from the car into his wheelchair. HH agency notified of discharge.  No additional TOC needs.  RNCM signing off.    Final next level of care: Home w Home Health Services Barriers to Discharge: Barriers Resolved   Patient Goals and CMS Choice            Discharge Placement                    Patient and family notified of of transfer: 11/23/24  Discharge Plan and Services Additional resources added to the After Visit Summary for   In-house Referral: Clinical Social Work              DME Arranged: 3-N-1 DME Agency: [employer]     Representative spoke with at DME Agency: [facility] HH Arranged: PT, RN HH Agency: [facility] Date HH Agency Contacted: 11/23/24 Time HH Agency Contacted: 1441 Representative spoke with at [facility] Agency: [facility]  Social Drivers of Health (SDOH) Interventions SDOH Screenings   Food Insecurity: No Food Insecurity (11/18/2024)  Housing: Low Risk (11/18/2024)  Transportation Needs: No Transportation Needs (11/18/2024)  Utilities: Not At Risk (11/18/2024)  Depression (PHQ2-9): Low Risk (04/07/2022)  Financial Resource Strain: Low Risk (09/21/2024)   Received from [facility]  Social Connections: Moderately Integrated (11/18/2024)  Tobacco Use: Medium Risk (11/16/2024)     Readmission Risk Interventions    11/19/2024    1:11 PM 10/17/2024   10:32 AM 03/26/2024    4:09 PM  Readmission Risk Prevention Plan  Transportation Screening Complete Complete Complete  Medication Review Oceanographer) Complete Complete Complete  PCP or Specialist appointment within 3-5  days of discharge -- Complete Complete  HRI or Home Care Consult Complete Complete Complete  SW Recovery Care/Counseling Consult  Complete Complete  Palliative Care Screening Not Applicable Not Applicable Not Applicable  Skilled Nursing Facility Complete Not Applicable Not Applicable

## 2024-11-23 NOTE — Plan of Care (Signed)
" °  Problem: Education: Goal: Ability to describe self-care measures that may prevent or decrease complications (Diabetes Survival Skills Education) will improve 11/23/2024 1248 by Freeda Leon POUR, RN Outcome: Adequate for Discharge 11/23/2024 1052 by Freeda Leon POUR, RN Outcome: Progressing Goal: Individualized Educational Video(s) Outcome: Adequate for Discharge   Problem: Coping: Goal: Ability to adjust to condition or change in health will improve 11/23/2024 1248 by Freeda Leon POUR, RN Outcome: Adequate for Discharge 11/23/2024 1052 by Freeda Leon POUR, RN Outcome: Progressing   Problem: Fluid Volume: Goal: Ability to maintain a balanced intake and output will improve 11/23/2024 1248 by Freeda Leon POUR, RN Outcome: Adequate for Discharge 11/23/2024 1052 by Freeda Leon POUR, RN Outcome: Progressing   Problem: Health Behavior/Discharge Planning: Goal: Ability to identify and utilize available resources and services will improve 11/23/2024 1248 by Freeda Leon POUR, RN Outcome: Adequate for Discharge 11/23/2024 1052 by Freeda Leon POUR, RN Outcome: Progressing Goal: Ability to manage health-related needs will improve Outcome: Adequate for Discharge   Problem: Metabolic: Goal: Ability to maintain appropriate glucose levels will improve Outcome: Adequate for Discharge   Problem: Nutritional: Goal: Maintenance of adequate nutrition will improve Outcome: Adequate for Discharge Goal: Progress toward achieving an optimal weight will improve Outcome: Adequate for Discharge   Problem: Skin Integrity: Goal: Risk for impaired skin integrity will decrease Outcome: Adequate for Discharge   Problem: Tissue Perfusion: Goal: Adequacy of tissue perfusion will improve Outcome: Adequate for Discharge   Problem: Education: Goal: Knowledge of General Education information will improve Description: Including pain rating scale, medication(s)/side effects and non-pharmacologic comfort  measures Outcome: Adequate for Discharge   Problem: Health Behavior/Discharge Planning: Goal: Ability to manage health-related needs will improve Outcome: Adequate for Discharge   Problem: Clinical Measurements: Goal: Ability to maintain clinical measurements within normal limits will improve Outcome: Adequate for Discharge Goal: Will remain free from infection Outcome: Adequate for Discharge Goal: Diagnostic test results will improve Outcome: Adequate for Discharge Goal: Respiratory complications will improve Outcome: Adequate for Discharge Goal: Cardiovascular complication will be avoided Outcome: Adequate for Discharge   Problem: Activity: Goal: Risk for activity intolerance will decrease Outcome: Adequate for Discharge   Problem: Nutrition: Goal: Adequate nutrition will be maintained Outcome: Adequate for Discharge   Problem: Coping: Goal: Level of anxiety will decrease Outcome: Adequate for Discharge   Problem: Elimination: Goal: Will not experience complications related to bowel motility Outcome: Adequate for Discharge Goal: Will not experience complications related to urinary retention Outcome: Adequate for Discharge   Problem: Pain Managment: Goal: General experience of comfort will improve and/or be controlled Outcome: Adequate for Discharge   Problem: Safety: Goal: Ability to remain free from injury will improve Outcome: Adequate for Discharge   Problem: Skin Integrity: Goal: Risk for impaired skin integrity will decrease Outcome: Adequate for Discharge   Problem: Education: Goal: Ability to demonstrate management of disease process will improve Outcome: Adequate for Discharge Goal: Ability to verbalize understanding of medication therapies will improve Outcome: Adequate for Discharge Goal: Individualized Educational Video(s) Outcome: Adequate for Discharge   Problem: Activity: Goal: Capacity to carry out activities will improve Outcome: Adequate  for Discharge   Problem: Cardiac: Goal: Ability to achieve and maintain adequate cardiopulmonary perfusion will improve Outcome: Adequate for Discharge   "

## 2024-11-23 NOTE — TOC CM/SW Note (Signed)
 Patient is not able to walk the distance required to go the bathroom, or he/she is unable to safely negotiate stairs required to access the bathroom.  A 3in1 BSC will alleviate this problem

## 2024-11-23 NOTE — Plan of Care (Signed)

## 2024-11-23 NOTE — Progress Notes (Signed)
 Reviewed discharge instructions with pt and daughter at bedside both verbalize understanding. IV removed. Tele removed. PT has belongings and medications from pharmacy

## 2024-11-23 NOTE — Plan of Care (Signed)
 PMT consult noted for GOC. Notified by [facility] and attending, patient and family would like to go home with hospice and hospice has been engaged. Per communication PMT will cancel consult. Please reconsult if needs arise.

## 2024-12-03 ENCOUNTER — Ambulatory Visit: Admitting: Family

## 2024-12-06 ENCOUNTER — Emergency Department

## 2024-12-06 ENCOUNTER — Encounter: Payer: Self-pay | Admitting: *Deleted

## 2024-12-06 ENCOUNTER — Other Ambulatory Visit: Payer: Self-pay

## 2024-12-06 ENCOUNTER — Inpatient Hospital Stay
Admission: EM | Admit: 2024-12-06 | Discharge: 2024-12-12 | DRG: 683 | Disposition: A | Attending: Student | Admitting: Student

## 2024-12-06 DIAGNOSIS — R55 Syncope and collapse: Secondary | ICD-10-CM | POA: Diagnosis not present

## 2024-12-06 DIAGNOSIS — R42 Dizziness and giddiness: Secondary | ICD-10-CM | POA: Diagnosis not present

## 2024-12-06 DIAGNOSIS — Z8719 Personal history of other diseases of the digestive system: Secondary | ICD-10-CM

## 2024-12-06 DIAGNOSIS — I2510 Atherosclerotic heart disease of native coronary artery without angina pectoris: Secondary | ICD-10-CM | POA: Diagnosis present

## 2024-12-06 DIAGNOSIS — I480 Paroxysmal atrial fibrillation: Secondary | ICD-10-CM | POA: Diagnosis present

## 2024-12-06 DIAGNOSIS — I272 Pulmonary hypertension, unspecified: Secondary | ICD-10-CM | POA: Diagnosis present

## 2024-12-06 DIAGNOSIS — E1122 Type 2 diabetes mellitus with diabetic chronic kidney disease: Secondary | ICD-10-CM | POA: Diagnosis present

## 2024-12-06 DIAGNOSIS — E559 Vitamin D deficiency, unspecified: Secondary | ICD-10-CM | POA: Diagnosis present

## 2024-12-06 DIAGNOSIS — Z950 Presence of cardiac pacemaker: Secondary | ICD-10-CM

## 2024-12-06 DIAGNOSIS — I25810 Atherosclerosis of coronary artery bypass graft(s) without angina pectoris: Secondary | ICD-10-CM | POA: Diagnosis present

## 2024-12-06 DIAGNOSIS — N179 Acute kidney failure, unspecified: Principal | ICD-10-CM | POA: Diagnosis present

## 2024-12-06 DIAGNOSIS — Z9861 Coronary angioplasty status: Secondary | ICD-10-CM

## 2024-12-06 DIAGNOSIS — E871 Hypo-osmolality and hyponatremia: Secondary | ICD-10-CM | POA: Diagnosis present

## 2024-12-06 DIAGNOSIS — E1169 Type 2 diabetes mellitus with other specified complication: Secondary | ICD-10-CM | POA: Diagnosis present

## 2024-12-06 DIAGNOSIS — Z8616 Personal history of COVID-19: Secondary | ICD-10-CM

## 2024-12-06 DIAGNOSIS — N184 Chronic kidney disease, stage 4 (severe): Secondary | ICD-10-CM | POA: Diagnosis present

## 2024-12-06 DIAGNOSIS — T502X5A Adverse effect of carbonic-anhydrase inhibitors, benzothiadiazides and other diuretics, initial encounter: Secondary | ICD-10-CM | POA: Diagnosis present

## 2024-12-06 DIAGNOSIS — N281 Cyst of kidney, acquired: Secondary | ICD-10-CM | POA: Diagnosis present

## 2024-12-06 DIAGNOSIS — E872 Acidosis, unspecified: Secondary | ICD-10-CM | POA: Diagnosis present

## 2024-12-06 DIAGNOSIS — D631 Anemia in chronic kidney disease: Secondary | ICD-10-CM | POA: Diagnosis present

## 2024-12-06 DIAGNOSIS — Z7901 Long term (current) use of anticoagulants: Secondary | ICD-10-CM

## 2024-12-06 DIAGNOSIS — I255 Ischemic cardiomyopathy: Secondary | ICD-10-CM | POA: Diagnosis present

## 2024-12-06 DIAGNOSIS — I10 Essential (primary) hypertension: Secondary | ICD-10-CM | POA: Diagnosis present

## 2024-12-06 DIAGNOSIS — I5022 Chronic systolic (congestive) heart failure: Secondary | ICD-10-CM | POA: Diagnosis present

## 2024-12-06 DIAGNOSIS — Z87891 Personal history of nicotine dependence: Secondary | ICD-10-CM

## 2024-12-06 DIAGNOSIS — Z8546 Personal history of malignant neoplasm of prostate: Secondary | ICD-10-CM

## 2024-12-06 DIAGNOSIS — Z1152 Encounter for screening for COVID-19: Secondary | ICD-10-CM

## 2024-12-06 DIAGNOSIS — E7849 Other hyperlipidemia: Secondary | ICD-10-CM | POA: Diagnosis present

## 2024-12-06 DIAGNOSIS — R5381 Other malaise: Secondary | ICD-10-CM | POA: Diagnosis present

## 2024-12-06 DIAGNOSIS — Z794 Long term (current) use of insulin: Secondary | ICD-10-CM

## 2024-12-06 DIAGNOSIS — I130 Hypertensive heart and chronic kidney disease with heart failure and stage 1 through stage 4 chronic kidney disease, or unspecified chronic kidney disease: Secondary | ICD-10-CM | POA: Diagnosis present

## 2024-12-06 DIAGNOSIS — E860 Dehydration: Secondary | ICD-10-CM | POA: Diagnosis present

## 2024-12-06 DIAGNOSIS — I442 Atrioventricular block, complete: Secondary | ICD-10-CM | POA: Diagnosis present

## 2024-12-06 DIAGNOSIS — I252 Old myocardial infarction: Secondary | ICD-10-CM

## 2024-12-06 DIAGNOSIS — I358 Other nonrheumatic aortic valve disorders: Secondary | ICD-10-CM | POA: Diagnosis present

## 2024-12-06 DIAGNOSIS — N1832 Chronic kidney disease, stage 3b: Secondary | ICD-10-CM | POA: Diagnosis present

## 2024-12-06 DIAGNOSIS — I420 Dilated cardiomyopathy: Secondary | ICD-10-CM | POA: Diagnosis present

## 2024-12-06 DIAGNOSIS — I959 Hypotension, unspecified: Secondary | ICD-10-CM | POA: Diagnosis present

## 2024-12-06 DIAGNOSIS — Z79899 Other long term (current) drug therapy: Secondary | ICD-10-CM

## 2024-12-06 DIAGNOSIS — Z8249 Family history of ischemic heart disease and other diseases of the circulatory system: Secondary | ICD-10-CM

## 2024-12-06 DIAGNOSIS — Z888 Allergy status to other drugs, medicaments and biological substances status: Secondary | ICD-10-CM

## 2024-12-06 DIAGNOSIS — M170 Bilateral primary osteoarthritis of knee: Secondary | ICD-10-CM | POA: Diagnosis present

## 2024-12-06 DIAGNOSIS — Z8674 Personal history of sudden cardiac arrest: Secondary | ICD-10-CM

## 2024-12-06 LAB — CBC
HCT: 45.8 % (ref 39.0–52.0)
Hemoglobin: 14.2 g/dL (ref 13.0–17.0)
MCH: 27 pg (ref 26.0–34.0)
MCHC: 31 g/dL (ref 30.0–36.0)
MCV: 87.2 fL (ref 80.0–100.0)
Platelets: 168 10*3/uL (ref 150–400)
RBC: 5.25 MIL/uL (ref 4.22–5.81)
RDW: 16.4 % — ABNORMAL HIGH (ref 11.5–15.5)
WBC: 6.7 10*3/uL (ref 4.0–10.5)
nRBC: 0 % (ref 0.0–0.2)

## 2024-12-06 LAB — TROPONIN T, HIGH SENSITIVITY
Troponin T High Sensitivity: 156 ng/L (ref 0–19)
Troponin T High Sensitivity: 137 ng/L (ref 0–19)

## 2024-12-06 LAB — CBG MONITORING, ED: Glucose-Capillary: 185 mg/dL — ABNORMAL HIGH (ref 70–99)

## 2024-12-06 LAB — RESP PANEL BY RT-PCR (RSV, FLU A&B, COVID)  RVPGX2
Influenza A by PCR: NEGATIVE
Influenza B by PCR: NEGATIVE
Resp Syncytial Virus by PCR: NEGATIVE
SARS Coronavirus 2 by RT PCR: NEGATIVE

## 2024-12-06 LAB — BASIC METABOLIC PANEL WITH GFR
Anion gap: 17 — ABNORMAL HIGH (ref 5–15)
BUN: 93 mg/dL — ABNORMAL HIGH (ref 8–23)
CO2: 19 mmol/L — ABNORMAL LOW (ref 22–32)
Calcium: 8.5 mg/dL — ABNORMAL LOW (ref 8.9–10.3)
Chloride: 95 mmol/L — ABNORMAL LOW (ref 98–111)
Creatinine, Ser: 4.44 mg/dL — ABNORMAL HIGH (ref 0.61–1.24)
GFR, Estimated: 12 mL/min — ABNORMAL LOW
Glucose, Bld: 193 mg/dL — ABNORMAL HIGH (ref 70–99)
Potassium: 3.8 mmol/L (ref 3.5–5.1)
Sodium: 131 mmol/L — ABNORMAL LOW (ref 135–145)

## 2024-12-06 MED ORDER — NITROGLYCERIN 0.4 MG SL SUBL: 0.4000 mg | SUBLINGUAL_TABLET | SUBLINGUAL | Status: DC | PRN

## 2024-12-06 MED ORDER — ONDANSETRON HCL 4 MG/2ML IJ SOLN: 4.0000 mg | Freq: Four times a day (QID) | INTRAMUSCULAR | Status: DC | PRN

## 2024-12-06 MED ORDER — SODIUM BICARBONATE 8.4 % IV SOLN
INTRAVENOUS | Status: AC
  Filled 2024-12-06: qty 150

## 2024-12-06 MED ORDER — TAMSULOSIN HCL 0.4 MG PO CAPS
0.4000 mg | ORAL_CAPSULE | Freq: Every day | ORAL | Status: DC
  Administered 2024-12-07 – 2024-12-12 (×6): 0.4 mg via ORAL
  Filled 2024-12-06 (×6): qty 1

## 2024-12-06 MED ORDER — ACETAMINOPHEN 650 MG RE SUPP: 650.0000 mg | Freq: Four times a day (QID) | RECTAL | Status: DC | PRN

## 2024-12-06 MED ORDER — APIXABAN 2.5 MG PO TABS
2.5000 mg | ORAL_TABLET | Freq: Two times a day (BID) | ORAL | Status: DC
  Administered 2024-12-07 – 2024-12-12 (×11): 2.5 mg via ORAL
  Filled 2024-12-06 (×11): qty 1

## 2024-12-06 MED ORDER — ACETAMINOPHEN 325 MG PO TABS
650.0000 mg | ORAL_TABLET | Freq: Four times a day (QID) | ORAL | Status: DC | PRN
  Administered 2024-12-09 – 2024-12-10 (×3): 650 mg via ORAL
  Filled 2024-12-06 (×3): qty 2

## 2024-12-06 MED ORDER — CARVEDILOL 6.25 MG PO TABS
6.2500 mg | ORAL_TABLET | Freq: Two times a day (BID) | ORAL | Status: DC
  Filled 2024-12-06: qty 1

## 2024-12-06 MED ORDER — ALBUTEROL SULFATE (2.5 MG/3ML) 0.083% IN NEBU: 2.5000 mg | INHALATION_SOLUTION | Freq: Four times a day (QID) | RESPIRATORY_TRACT | Status: DC | PRN

## 2024-12-06 MED ORDER — ONDANSETRON HCL 4 MG PO TABS: 4.0000 mg | ORAL_TABLET | Freq: Four times a day (QID) | ORAL | Status: DC | PRN

## 2024-12-06 MED ORDER — MAGNESIUM OXIDE -MG SUPPLEMENT 400 (240 MG) MG PO TABS
400.0000 mg | ORAL_TABLET | Freq: Every day | ORAL | Status: DC
  Administered 2024-12-07 – 2024-12-12 (×6): 400 mg via ORAL
  Filled 2024-12-06 (×6): qty 1

## 2024-12-06 MED ORDER — SODIUM CHLORIDE 0.9 % IV BOLUS
1000.0000 mL | Freq: Once | INTRAVENOUS | Status: AC
  Administered 2024-12-06: 1000 mL via INTRAVENOUS

## 2024-12-06 MED ORDER — ROSUVASTATIN CALCIUM 20 MG PO TABS
20.0000 mg | ORAL_TABLET | Freq: Every day | ORAL | Status: DC
  Administered 2024-12-07 – 2024-12-12 (×6): 20 mg via ORAL
  Filled 2024-12-06 (×6): qty 1

## 2024-12-06 MED ORDER — HYDROCODONE-ACETAMINOPHEN 5-325 MG PO TABS: 1.0000 | ORAL_TABLET | ORAL | Status: DC | PRN

## 2024-12-06 MED ORDER — AMIODARONE HCL 200 MG PO TABS
200.0000 mg | ORAL_TABLET | Freq: Two times a day (BID) | ORAL | Status: DC
  Administered 2024-12-07 – 2024-12-12 (×11): 200 mg via ORAL
  Filled 2024-12-06 (×11): qty 1

## 2024-12-06 NOTE — Assessment & Plan Note (Signed)
 As above, holding GDMT pending renal recovery and improved blood pressure Daily weights Monitor for fluid overload in view of gentle hydration to treat AKI

## 2024-12-06 NOTE — ED Triage Notes (Signed)
 Pt arrives via EMS from home for c/o dizziness x hr; hx pacemaker, diabetes

## 2024-12-06 NOTE — ED Notes (Signed)
 Patient taken back to room via wheelchair by this tech and Arianne, EDT. While assisting patient to bed, patient was extremely week and fell backwards on bed. Patient did not fall but was unable to hold himself up. Patient hooked up to cardiac monitor. Both side rails up and bed locked in lowest position. Call bell within reach.

## 2024-12-06 NOTE — ED Triage Notes (Signed)
 Pt brought in via ems from home with dizziness.  No n/v  no chest pain   no sob.  No headache.  Dizziness began at [facility] [place].  Hx diabetes  pacemaker.  Pt alert  speech clear

## 2024-12-06 NOTE — ED Provider Notes (Signed)
 "  [facility] Provider Note    Event Date/Time   First MD Initiated Contact with Patient 12/06/24 2215     (approximate)  History   Chief Complaint: Dizziness  HPI  [name] is a 89 y.o. male with a past medical history of ACS, CAD status post CABG, CKD, diabetes, hypertension, hyperlipidemia, paroxysmal atrial fibrillation, presents to the emergency department for generalized weakness and dizziness.  According to the patient had been doing well until around 7:00 tonight or so states he stood up and was feeling very dizzy and lightheaded.  Patient denies any headache denies any weakness or numbness of any arm or leg denies any chest pain or abdominal pain.  Patient denies any recent vomiting diarrhea or dysuria.  Denies any fever cough or congestion.  Physical Exam   Triage Vital Signs: ED Triage Vitals  Encounter Vitals Group     BP 12/06/24 2048 (!) 102/57     Girls Systolic BP Percentile --      Girls Diastolic BP Percentile --      Boys Systolic BP Percentile --      Boys Diastolic BP Percentile --      Pulse Rate 12/06/24 2048 63     Resp 12/06/24 2048 18     Temp 12/06/24 2048 97.6 F (36.4 C)     Temp Source 12/06/24 2048 Oral     SpO2 12/06/24 2043 98 %     Weight 12/06/24 2049 206 lb (93.4 kg)     Height 12/06/24 2049 5' 8 (1.727 m)     Head Circumference --      Peak Flow --      Pain Score 12/06/24 2053 0     Pain Loc --      Pain Education --      Exclude from Growth Chart --     Most recent vital signs: Vitals:   12/06/24 2043 12/06/24 2048  BP:  (!) 102/57  Pulse:  63  Resp:  18  Temp:  97.6 F (36.4 C)  SpO2: 98% 97%    General: Awake, no distress.  CV:  Good peripheral perfusion.  Regular rate and rhythm  Resp:  Normal effort.  Equal breath sounds bilaterally.  Abd:  No distention.  Soft, nontender.  No rebound or guarding.  ED Results / Procedures / Treatments   EKG  Patient has a 80 paced rhythm at 61 bpm with  a slightly widened QRS, normal axis, slight PR prolongation otherwise normal intervals with nonspecific ST changes.  RADIOLOGY  I have reviewed interpret the chest x-ray images.  No obvious consolidation on my evaluation. Radiology is read the x-ray is negative CT head read as negative.  MEDICATIONS ORDERED IN ED: Medications  sodium chloride  0.9 % bolus 1,000 mL (has no administration in time range)     IMPRESSION / MDM / ASSESSMENT AND PLAN / ED COURSE  I reviewed the triage vital signs and the nursing notes.  Patient's presentation is most consistent with acute presentation with potential threat to life or bodily function.  Patient presents to the emergency department for dyspnea/weakness starting earlier tonight.  Here overall the patient appears well, denies any focal weakness or numbness.  CT scan of the head is negative.  Chest x-ray is clear.  Patient's lab work today shows reassuring CBC with a normal white blood cell count.  Patient's chemistry however shows acute kidney injury with a creatinine of 4.4 from a baseline around 2.3.  Anion gap elevated to 17 concerning for dehydration leading to AKI.  Patient's troponin is elevated to 156 however this appears chronic and largely unchanged from historical values.  Patient denies any decrease in fluid intake denies any nausea vomiting or diarrhea no infectious symptoms.  Will send a COVID/flu swab as a precaution.  I reviewed the patient's last discharge summary from 1/16 at which time the patient was admitted for CHF exacerbation and underwent inpatient diuresis.  Looks like they started the patient on torsemide  after stopping metolazone .  Suspect overdiuresis leading to AKI.  Will IV hydrate and admit to the hospital service for further workup and treatment  FINAL CLINICAL IMPRESSION(S) / ED DIAGNOSES   Weakness AKI    Note:  This document was prepared using Dragon voice recognition software and may include unintentional dictation  errors.   Dorothyann Drivers, MD 12/06/24 2226  "

## 2024-12-07 ENCOUNTER — Observation Stay

## 2024-12-07 DIAGNOSIS — R42 Dizziness and giddiness: Secondary | ICD-10-CM | POA: Diagnosis not present

## 2024-12-07 DIAGNOSIS — N179 Acute kidney failure, unspecified: Principal | ICD-10-CM | POA: Diagnosis present

## 2024-12-07 DIAGNOSIS — R55 Syncope and collapse: Secondary | ICD-10-CM | POA: Diagnosis not present

## 2024-12-07 LAB — VITAMIN D 25 HYDROXY (VIT D DEFICIENCY, FRACTURES): Vit D, 25-Hydroxy: 10.4 ng/mL — ABNORMAL LOW (ref 30–100)

## 2024-12-07 LAB — BASIC METABOLIC PANEL WITH GFR
Anion gap: 11 (ref 5–15)
BUN: 90 mg/dL — ABNORMAL HIGH (ref 8–23)
CO2: 26 mmol/L (ref 22–32)
Calcium: 8 mg/dL — ABNORMAL LOW (ref 8.9–10.3)
Chloride: 95 mmol/L — ABNORMAL LOW (ref 98–111)
Creatinine, Ser: 4.29 mg/dL — ABNORMAL HIGH (ref 0.61–1.24)
GFR, Estimated: 13 mL/min — ABNORMAL LOW
Glucose, Bld: 188 mg/dL — ABNORMAL HIGH (ref 70–99)
Potassium: 3.5 mmol/L (ref 3.5–5.1)
Sodium: 132 mmol/L — ABNORMAL LOW (ref 135–145)

## 2024-12-07 LAB — CBC
HCT: 42.3 % (ref 39.0–52.0)
Hemoglobin: 13.5 g/dL (ref 13.0–17.0)
MCH: 27.6 pg (ref 26.0–34.0)
MCHC: 31.9 g/dL (ref 30.0–36.0)
MCV: 86.3 fL (ref 80.0–100.0)
Platelets: 151 10*3/uL (ref 150–400)
RBC: 4.9 MIL/uL (ref 4.22–5.81)
RDW: 16.3 % — ABNORMAL HIGH (ref 11.5–15.5)
WBC: 6.5 10*3/uL (ref 4.0–10.5)
nRBC: 0 % (ref 0.0–0.2)

## 2024-12-07 LAB — IRON AND TIBC
Iron: 71 ug/dL (ref 45–182)
Saturation Ratios: 35 % (ref 17.9–39.5)
TIBC: 204 ug/dL — ABNORMAL LOW (ref 250–450)
UIBC: 133 ug/dL

## 2024-12-07 LAB — MAGNESIUM: Magnesium: 1.8 mg/dL (ref 1.7–2.4)

## 2024-12-07 LAB — PHOSPHORUS: Phosphorus: 3.2 mg/dL (ref 2.5–4.6)

## 2024-12-07 MED ORDER — SODIUM CHLORIDE 0.9 % IV SOLN: INTRAVENOUS | Status: AC

## 2024-12-07 MED ORDER — CARVEDILOL 3.125 MG PO TABS: 6.2500 mg | ORAL_TABLET | Freq: Two times a day (BID) | ORAL | Status: DC

## 2024-12-07 NOTE — ED Notes (Signed)
 Pt eating breakfast tray

## 2024-12-07 NOTE — ED Notes (Signed)
 US  at bedside.

## 2024-12-07 NOTE — Progress Notes (Signed)
" ° °  Brief Progress Note   _____________________________________________________________________________________________________________  Patient Name: [name] Patient DOB: May 12, 1935 Date: 12/07/2024     Data: Patient admitted on 1/29 with postural dizziness with presyncope as Progressive LOC. BP 103/65, HR 81, 100% on R/A. Trops trending down, along with BUN & Creat.     Action: Reached out to Dr Von to see if patient could be downgraded to Tele.     Response: Dr Von placed order for Tele LOC.  _____________________________________________________________________________________________________________  [facility] RN Expeditor [name] Please contact us  directly via secure chat (search for [facility]) or by calling us  at [phone] [facility]).  "

## 2024-12-07 NOTE — ED Notes (Signed)
 Pt sleeping at this time. Chest rise and fall visible. Pt in no distress

## 2024-12-07 NOTE — Progress Notes (Signed)
 Heart Failure Navigator Progress Note  Assessed for Heart & Vascular TOC clinic readiness.  Patient does not meet criteria due to current AHF Team patient.  His next appointment is scheduled for 12/18/2024 @ 2:30 PM.   Navigator will sign off at this time.  Charmaine Pines, RN, BSN [facility] Heart Failure Navigator Secure Chat Only

## 2024-12-07 NOTE — Consult Note (Signed)
 " [facility]  CONSULT NOTE    Date: 12/07/2024                  Patient Name:  [name]  MRN: [id number]  DOB: 06-28-1935  Age / Sex: 89 y.o., male         PCP: [name], MD                 Service Requesting Consult: [facility]                 Reason for Consult: Acute kidney injury            History of Present Illness: [name] is a 89 y.o.  male with past medical history of CAD s/p CABG, GIB, PAF on eliquis , CHF, and chronic kidney disease stage IV., who was admitted to [facility] on 12/06/2024 for Postural dizziness with presyncope [R42, R55] AKI (acute kidney injury) [N17.9]  Patient seen resting on stretcher in ED. partially completed breakfast tray at bedside.  Patient states he has had progressive dizziness for the past few days.  Denies loss of appetite.  Denies nausea, vomiting, or diarrhea.  Denies shortness of breath.  No known fever or chills.  Does state he has had lightheadedness.  Has maintained all medications.  Labs on ED arrival include sodium 131, serum bicarb 19, BUN 93, creatinine 4.44 with GFR 5, troponin 156.  Respiratory panel negative for influenza, COVID-19, and RSV.  Chest x-ray negative.  CT head shows chronic ischemic changes.   Medications: Outpatient medications: (Not in a hospital admission)   Current medications: Current Facility-Administered Medications  Medication Dose Route Frequency Provider Last Rate Last Admin   0.9 %  sodium chloride  infusion   Intravenous Continuous Von Bellis, MD 75 mL/hr at 12/07/24 0946 New Bag at 12/07/24 0946   acetaminophen  (TYLENOL ) tablet 650 mg  650 mg Oral Q6H PRN Duncan, Hazel V, MD       Or   acetaminophen  (TYLENOL ) suppository 650 mg  650 mg Rectal Q6H PRN Cleatus Delayne GAILS, MD       albuterol  (PROVENTIL ) (2.5 MG/3ML) 0.083% nebulizer solution 2.5 mg  2.5 mg Inhalation Q6H PRN Cleatus Delayne GAILS, MD       amiodarone  (PACERONE ) tablet 200 mg  200 mg Oral BID Duncan, Hazel V, MD    200 mg at 12/07/24 9061   apixaban  (ELIQUIS ) tablet 2.5 mg  2.5 mg Oral BID Duncan, Hazel V, MD   2.5 mg at 12/07/24 0939   [START ON 12/08/2024] carvedilol  (COREG ) tablet 6.25 mg  6.25 mg Oral BID WC Kumar, Dileep, MD       HYDROcodone -acetaminophen  (NORCO/VICODIN) 5-325 MG per tablet 1-2 tablet  1-2 tablet Oral Q4H PRN Duncan, Hazel V, MD       magnesium  oxide (MAG-OX) tablet 400 mg  400 mg Oral Daily Duncan, Hazel V, MD   400 mg at 12/07/24 9061   nitroGLYCERIN  (NITROSTAT ) SL tablet 0.4 mg  0.4 mg Sublingual Q5 min PRN Cleatus Delayne GAILS, MD       ondansetron  (ZOFRAN ) tablet 4 mg  4 mg Oral Q6H PRN Duncan, Hazel V, MD       Or   ondansetron  (ZOFRAN ) injection 4 mg  4 mg Intravenous Q6H PRN Duncan, Hazel V, MD       rosuvastatin  (CRESTOR ) tablet 20 mg  20 mg Oral Daily Duncan, Hazel V, MD   20 mg at 12/07/24 9060   tamsulosin  (  FLOMAX ) capsule 0.4 mg  0.4 mg Oral Daily Duncan, Hazel V, MD   0.4 mg at 12/07/24 9061   Current Outpatient Medications  Medication Sig Dispense Refill   acetaminophen  (TYLENOL ) 500 MG tablet Take 1,000 mg by mouth every 6 (six) hours as needed for mild pain, fever or headache.     acyclovir  (ZOVIRAX ) 800 MG tablet Take 800 mg by mouth daily.     albuterol  (VENTOLIN  HFA) 108 (90 Base) MCG/ACT inhaler Inhale 2 puffs into the lungs every 6 (six) hours as needed for wheezing or shortness of breath.     amiodarone  (PACERONE ) 200 MG tablet Take 1 tablet (200 mg total) by mouth 2 (two) times daily. 60 tablet 0   apixaban  (ELIQUIS ) 2.5 MG TABS tablet TAKE 1 TABLET BY MOUTH TWICE A DAY 60 tablet 0   carvedilol  (COREG ) 6.25 MG tablet Take 1 tablet (6.25 mg total) by mouth 2 (two) times daily with a meal. 60 tablet 0   diclofenac  Sodium (VOLTAREN ) 1 % GEL Apply 2 g topically 4 (four) times daily.     ezetimibe  (ZETIA ) 10 MG tablet TAKE 1 TABLET BY MOUTH EVERY DAY 90 tablet 1   insulin  glargine (LANTUS ) 100 UNIT/ML injection Inject 15 Units into the skin at bedtime.     magnesium   oxide (MAG-OX) 400 (240 Mg) MG tablet Take 1 tablet by mouth daily.     metolazone  (ZAROXOLYN ) 2.5 MG tablet Take 2.5 mg by mouth. 2 times a week     nitroGLYCERIN  (NITROSTAT ) 0.4 MG SL tablet Place 0.4 mg under the tongue every 5 (five) minutes as needed for chest pain.     pantoprazole  (PROTONIX ) 40 MG tablet Take 1 tablet (40 mg total) by mouth 2 (two) times daily. (Patient taking differently: Take 40 mg by mouth daily as needed.) 60 tablet 1   polyethylene glycol (MIRALAX  / GLYCOLAX ) 17 g packet Take 17 g by mouth daily as needed for moderate constipation. 30 each 0   rosuvastatin  (CRESTOR ) 20 MG tablet Take 1 tablet (20 mg total) by mouth daily. 30 tablet 0   sacubitril -valsartan  (ENTRESTO ) 24-26 MG Take 1 tablet by mouth 2 (two) times daily. 60 tablet 0   spironolactone  (ALDACTONE ) 25 MG tablet Take 1 tablet (25 mg total) by mouth daily. 30 tablet 0   torsemide  (DEMADEX ) 20 MG tablet Take 4 tablets (80 mg total) by mouth daily. 120 tablet 0   tamsulosin  (FLOMAX ) 0.4 MG CAPS capsule Take 0.4 mg by mouth daily. (Patient not taking: Reported on 12/07/2024)        Allergies: Allergies[1]    Past Medical History: Past Medical History:  Diagnosis Date   ACS (acute coronary syndrome) (HCC) 06/03/2023   AV block, Mobitz 1    CAD (coronary artery disease)    a. 08/2019 VF arrest-->sev 3vd on cath-->CABGx5 (LIMA->LAD, VG->OM->LCX, VG->RPDA, VG->Diag); b. 08/2021 Cath: sev native multivessel dzs w/ 5/5 patent grafts. Nl filling pressures->Med rx; c. 05/2023 Cath: stable anatomy->Med rx.   CKD (chronic kidney disease), stage III (HCC)    COVID-19 12/23/2023   Diabetes mellitus without complication (HCC)    Elevated lactic acid level 10/16/2024   Elevated troponin 03/29/2022   HFrEF (heart failure with reduced ejection fraction) (HCC)    a. 08/2020 Echo: EF 40-45%; b. 11/2020 Echo: EF 55%, Gr2 DD; c. 08/2021 Echo: EF 35-40%, glob HK w/ ant/antsept/apical HK; d. 10/2021 s/p CRT-P; e. 12/2021 Echo:  EF 30-35%, glob HK, GrIII DD; f. 12/2022 Echo: EF 30-35%, glob  HK, sev HK of ant wall, mod asymm LVH, nl RV fxn, mildly dil LA, triv MR, AoV sclerosis.   Hyperlipidemia LDL goal <70    Hypertension    Hypertensive urgency 03/29/2022   Hypothermia 10/16/2024   Intermittent Complete heart block (HCC)    a. 08/2021 noted on Zio; b. 10/2021 s/p Abbott Allure RF CRT-P (Ser # [id number]).   Ischemic cardiomyopathy    a. 08/2020 Echo: EF 40-45%; b. 11/2020 Echo: EF 55%; c. 08/2021 Echo: EF 35-40%; d. 10/2021 s/p CRT-P; e. 12/2021 Echo: EF 30-35%, glob HK, GrIII DD.   Malignant neoplasm of prostate (HCC) 10/05/2005   Myocardial injury 10/16/2024   Non-ST elevation (NSTEMI) myocardial infarction (HCC) 08/21/2020   PAF (paroxysmal atrial fibrillation) (HCC)    a.Medical device check-->Eliquis  (CHA2DS2VASc = 6)   Proctitis 10/16/2024   Status post percutaneous transluminal coronary angioplasty 10/08/2008   Trifascicular block      Past Surgical History: Past Surgical History:  Procedure Laterality Date   BACK SURGERY     CARDIAC CATHETERIZATION     CLIPPING OF ATRIAL APPENDAGE N/A 08/29/2020   Procedure: CLIPPING OF ATRIAL APPENDAGE USING ATRICURE 45 MM ATRICLIP FLEX-V;  Surgeon: Army Dallas NOVAK, MD;  Location: [facility] OR;  Service: Open Heart Surgery;  Laterality: N/A;   CORONARY ARTERY BYPASS GRAFT N/A 08/29/2020   Procedure: CORONARY ARTERY BYPASS GRAFTING (CABG) X 5 USING LEFT INTERNAL MAMMARY ARTERY AND ENDOSCOPICALLY HARVESTED RIGHT GREATER SAPHENOUS VEIN. LIMA TO LAD, SVG TO OM SEQ TO CIRC, SVG TO PD, SVG TO DIAG.;  Surgeon: Army Dallas NOVAK, MD;  Location: [facility] OR;  Service: Open Heart Surgery;  Laterality: N/A;   ENDOVEIN HARVEST OF GREATER SAPHENOUS VEIN Right 08/29/2020   Procedure: ENDOVEIN HARVEST OF GREATER SAPHENOUS VEIN;  Surgeon: Army Dallas NOVAK, MD;  Location: [facility] OR;  Service: Open Heart Surgery;  Laterality: Right;   HERNIA REPAIR     LEFT HEART CATH AND CORONARY ANGIOGRAPHY N/A  08/25/2020   Procedure: LEFT HEART CATH AND CORONARY ANGIOGRAPHY;  Surgeon: Darron Deatrice LABOR, MD;  Location: [facility] INVASIVE CV LAB;  Service: Cardiovascular;  Laterality: N/A;   PACEMAKER IMPLANT N/A 10/12/2021   Procedure: PACEMAKER IMPLANT;  Surgeon: Cindie Ole DASEN, MD;  Location: [facility] INVASIVE CV LAB;  Service: Cardiovascular;  Laterality: N/A;   RIGHT/LEFT HEART CATH AND CORONARY ANGIOGRAPHY N/A 08/31/2021   Procedure: RIGHT/LEFT HEART CATH AND CORONARY ANGIOGRAPHY;  Surgeon: Darron Deatrice LABOR, MD;  Location: [facility] INVASIVE CV LAB;  Service: Cardiovascular;  Laterality: N/A;   RIGHT/LEFT HEART CATH AND CORONARY/GRAFT ANGIOGRAPHY N/A 06/06/2023   Procedure: RIGHT/LEFT HEART CATH AND CORONARY/GRAFT ANGIOGRAPHY;  Surgeon: Darron Deatrice LABOR, MD;  Location: [facility] INVASIVE CV LAB;  Service: Cardiovascular;  Laterality: N/A;   TEE WITHOUT CARDIOVERSION N/A 08/29/2020   Procedure: TRANSESOPHAGEAL ECHOCARDIOGRAM (TEE);  Surgeon: Army Dallas NOVAK, MD;  Location[facility] OR;  Service: Open Heart Surgery;  Laterality: N/A;     Family History: Family History  Problem Relation Age of Onset   Heart attack Mother      Social History: Social History   Socioeconomic History   Marital status: Married    Spouse name: Not on file   Number of children: Not on file   Years of education: Not on file   Highest education level: Not on file  Occupational History   Not on file  Tobacco Use   Smoking status: Former    Current packs/day: 0.00    Types: Cigarettes    Quit date: 07/26/1996  Years since quitting: 28.3   Smokeless tobacco: Never  Vaping Use   Vaping status: Never Used  Substance and Sexual Activity   Alcohol use: Not Currently   Drug use: Never   Sexual activity: Yes  Other Topics Concern   Not on file  Social History Narrative   Not on file   Social Drivers of Health   Tobacco Use: Medium Risk (12/06/2024)   Patient History    Smoking Tobacco Use: Former    Smokeless Tobacco Use:  Never    Passive Exposure: Not on file  Financial Resource Strain: Low Risk (09/21/2024)   Received [facility]   Overall Financial Resource Strain (CARDIA)    How hard is it for you to pay for the very basics like food, housing, medical care, and heating?: Not very hard  Food Insecurity: No Food Insecurity (11/18/2024)   Epic    Worried About Radiation Protection Practitioner of Food in the Last Year: Never true    Ran Out of Food in the Last Year: Never true  Transportation Needs: No Transportation Needs (11/18/2024)   Epic    Lack of Transportation (Medical): No    Lack of Transportation (Non-Medical): No  Physical Activity: Not on file  Stress: Not on file  Social Connections: Moderately Integrated (11/18/2024)   Social Connection and Isolation Panel    Frequency of Communication with Friends and Family: More than three times a week    Frequency of Social Gatherings with Friends and Family: More than three times a week    Attends Religious Services: More than 4 times per year    Active Member of [employer] or Organizations: No    Attends Banker Meetings: Never    Marital Status: Married  Catering Manager Violence: Not At Risk (11/18/2024)   Epic    Fear of Current or Ex-Partner: No    Emotionally Abused: No    Physically Abused: No    Sexually Abused: No  Depression (PHQ2-9): Low Risk (04/07/2022)   Depression (PHQ2-9)    PHQ-2 Score: 0  Alcohol Screen: Not on file  Housing: Low Risk (11/18/2024)   Epic    Unable to Pay for Housing in the Last Year: No    Number of Times Moved in the Last Year: 0    Homeless in the Last Year: No  Utilities: Not At Risk (11/18/2024)   Epic    Threatened with loss of utilities: No  Health Literacy: Not on file     Review of Systems: Review of Systems  Constitutional:  Negative for chills, fever and malaise/fatigue.  HENT:  Negative for congestion, sore throat and tinnitus.   Eyes:  Negative for blurred vision and redness.  Respiratory:   Negative for cough, shortness of breath and wheezing.   Cardiovascular:  Negative for chest pain, palpitations, claudication and leg swelling.  Gastrointestinal:  Negative for abdominal pain, blood in stool, diarrhea, nausea and vomiting.  Genitourinary:  Negative for flank pain, frequency and hematuria.  Musculoskeletal:  Negative for back pain, falls and myalgias.  Skin:  Negative for rash.  Neurological:  Positive for dizziness. Negative for weakness and headaches.  Endo/Heme/Allergies:  Does not bruise/bleed easily.  Psychiatric/Behavioral:  Negative for depression. The patient is not nervous/anxious and does not have insomnia.     Vital Signs: Blood pressure 103/65, pulse 81, temperature 98.3 F (36.8 C), temperature source Oral, resp. rate 18, height 5' 8 (1.727 m), weight 93.4 kg, SpO2 100%.  Weight trends:  Filed Weights   12/06/24 2049  Weight: 93.4 kg    Physical Exam: General: NAD, frail  Head: Normocephalic, atraumatic. Moist oral mucosal membranes  Eyes: Anicteric  Lungs:  Clear to auscultation  Heart: Regular rate and rhythm  Abdomen:  Soft, nontender,   Extremities:  No peripheral edema.  Neurologic: Nonfocal, moving all four extremities  Skin: No lesions  Access: none     Lab results: Basic Metabolic Panel: Recent Labs  Lab 12/06/24 2056 12/07/24 0449  NA 131* 132*  K 3.8 3.5  CL 95* 95*  CO2 19* 26  GLUCOSE 193* 188*  BUN 93* 90*  CREATININE 4.44* 4.29*  CALCIUM  8.5* 8.0*  MG  --  1.8  PHOS  --  3.2    Liver Function Tests: No results for input(s): AST, ALT, ALKPHOS, BILITOT, PROT, ALBUMIN  in the last 168 hours. No results for input(s): LIPASE, AMYLASE in the last 168 hours. No results for input(s): AMMONIA in the last 168 hours.  CBC: Recent Labs  Lab 12/06/24 2056 12/07/24 0449  WBC 6.7 6.5  HGB 14.2 13.5  HCT 45.8 42.3  MCV 87.2 86.3  PLT 168 151    Cardiac Enzymes: No results for input(s): CKTOTAL, CKMB,  CKMBINDEX, TROPONINI in the last 168 hours.  BNP: Invalid input(s): POCBNP  CBG: Recent Labs  Lab 12/06/24 2103  GLUCAP 185*    Microbiology: Results for orders placed or performed during the hospital encounter of 12/06/24  Resp panel by RT-PCR (RSV, Flu A&B, Covid) Anterior Nasal Swab     Status: None   Collection Time: 12/06/24 10:33 PM   Specimen: Anterior Nasal Swab  Result Value Ref Range Status   SARS Coronavirus 2 by RT PCR NEGATIVE NEGATIVE Final    Comment: (NOTE) SARS-CoV-2 target nucleic acids are NOT DETECTED.  The SARS-CoV-2 RNA is generally detectable in upper respiratory specimens during the acute phase of infection. The lowest concentration of SARS-CoV-2 viral copies this assay can detect is 138 copies/mL. A negative result does not preclude SARS-Cov-2 infection and should not be used as the sole basis for treatment or other patient management decisions. A negative result may occur with  improper specimen collection/handling, submission of specimen other than nasopharyngeal swab, presence of viral mutation(s) within the areas targeted by this assay, and inadequate number of viral copies(<138 copies/mL). A negative result must be combined with clinical observations, patient history, and epidemiological information. The expected result is Negative.  Fact Sheet for Patients:  bloggercourse.com  Fact Sheet for Healthcare Providers:  seriousbroker.it  This test is no t yet approved or cleared by the [place]  FDA and  has been authorized for detection and/or diagnosis of SARS-CoV-2 by FDA under an Emergency Use Authorization (EUA). This EUA will remain  in effect (meaning this test can be used) for the duration of the COVID-19 declaration under Section 564(b)(1) of the Act, 21 U.S.C.section 360bbb-3(b)(1), unless the authorization is terminated  or revoked sooner.       Influenza A by PCR NEGATIVE  NEGATIVE Final   Influenza B by PCR NEGATIVE NEGATIVE Final    Comment: (NOTE) The Xpert Xpress SARS-CoV-2/FLU/RSV plus assay is intended as an aid in the diagnosis of influenza from Nasopharyngeal swab specimens and should not be used as a sole basis for treatment. Nasal washings and aspirates are unacceptable for Xpert Xpress SARS-CoV-2/FLU/RSV testing.  Fact Sheet for Patients: bloggercourse.com  Fact Sheet for Healthcare Providers: seriousbroker.it  This test is not yet approved or cleared by  the [place]  FDA and has been authorized for detection and/or diagnosis of SARS-CoV-2 by FDA under an Emergency Use Authorization (EUA). This EUA will remain in effect (meaning this test can be used) for the duration of the COVID-19 declaration under Section 564(b)(1) of the Act, 21 U.S.C. section 360bbb-3(b)(1), unless the authorization is terminated or revoked.     Resp Syncytial Virus by PCR NEGATIVE NEGATIVE Final    Comment: (NOTE) Fact Sheet for Patients: bloggercourse.com  Fact Sheet for Healthcare Providers: seriousbroker.it  This test is not yet approved or cleared by the [place]  FDA and has been authorized for detection and/or diagnosis of SARS-CoV-2 by FDA under an Emergency Use Authorization (EUA). This EUA will remain in effect (meaning this test can be used) for the duration of the COVID-19 declaration under Section 564(b)(1) of the Act, 21 U.S.C. section 360bbb-3(b)(1), unless the authorization is terminated or revoked.  Performed at [facility], [address] [place], [place] [address]     Coagulation Studies: No results for input(s): LABPROT, INR in the last 72 hours.  Urinalysis: No results for input(s): COLORURINE, LABSPEC, PHURINE, GLUCOSEU, HGBUR, BILIRUBINUR, KETONESUR, PROTEINUR, UROBILINOGEN, NITRITE,  LEUKOCYTESUR in the last 72 hours.  Invalid input(s): APPERANCEUR    Imaging: US  RENAL Result Date: 12/07/2024 EXAM: RETROPERITONEAL ULTRASOUND OF THE KIDNEYS 12/07/2024 09:53:10 AM TECHNIQUE: Real-time ultrasonography of the retroperitoneum, specifically the kidneys and urinary bladder, was performed. COMPARISON: CT dated 10/15/2024. CLINICAL HISTORY: Acute kidney injury (AKI). FINDINGS: RIGHT KIDNEY: Right kidney measures 11.5 x 6.7 x 6.2 cm. Normal cortical echogenicity. No hydronephrosis. No calculus. Multiple right renal cortical cysts, largest 10.3 cm from the lower pole. LEFT KIDNEY: Left kidney measures 11.8 x 6.3 x 5.2 cm. Normal cortical echogenicity. No hydronephrosis. No calculus. Multiple left renal cortical cysts, largest 3.8 cm mid pole. BLADDER: Urinary bladder physiologically distended. LIVER: A 3.4 cm simple appearing right hepatic cyst is noted, consistent with findings on the prior CT. IMPRESSION: 1. No hydronephrosis. 2. Multiple   renal cortical cysts. Electronically signed by: Dayne Hassell MD 12/07/2024 10:14 AM EST RP Workstation: [id number]   CT Head Wo Contrast Result Date: 12/06/2024 EXAM: CT HEAD WITHOUT CONTRAST 12/06/2024 09:46:36 PM TECHNIQUE: CT of the head was performed without the administration of intravenous contrast. Automated exposure control, iterative reconstruction, and/or weight based adjustment of the mA/kV was utilized to reduce the radiation dose to as low as reasonably achievable. COMPARISON: 10/15/2024 CLINICAL HISTORY: Syncope/presyncope; cerebrovascular cause suspected. FINDINGS: BRAIN AND VENTRICLES: No acute hemorrhage. No evidence of acute infarct. Chronic right basal ganglia infarct. Age-related cerebral volume loss and moderate periventricular white matter disease. No extra-axial collection. No mass effect or midline shift. ORBITS: No acute abnormality. Bilateral lens replacement. SINUSES: No acute abnormality. SOFT TISSUES AND SKULL: No acute soft  tissue abnormality. No skull fracture. Moderate calcific atheromatous disease within carotid siphons. IMPRESSION: 1. Chronic ischemic changes without acute abnormality. Electronically signed by: Oneil Devonshire MD 12/06/2024 09:56 PM EST RP Workstation: [id number]   DG Chest 1 View Result Date: 12/06/2024 EXAM: 2 VIEW(S) XRAY OF THE CHEST 12/06/2024 09:17:00 PM COMPARISON: Chest radiograph 11/16/2024 and CT chest 10/15/2024. CLINICAL HISTORY: Dizziness. FINDINGS: LUNGS AND PLEURA: Lungs are clear. No pleural effusion. No pneumothorax. HEART AND MEDIASTINUM: Postoperative changes in the mediastinum. Cardiac pacemaker. Heart size and pulmonary vascularity are normal. Mediastinal contours appear intact. BONES AND SOFT TISSUES: Heterogeneous calcification projecting over the right chest laterally corresponds to subscapular soft tissue calcification on prior CT chest. No change since prior  study. No acute osseous abnormality. IMPRESSION: 1. No acute cardiopulmonary abnormality. Electronically signed by: Elsie Gravely MD 12/06/2024 09:21 PM EST RP Workstation: [id number]     Assessment & Plan: [name] is a 89 y.o.  male with past medical history of CAD s/p CABG, GIB, PAF on eliquis , CHF, and chronic kidney disease stage IV., who was admitted to [facility] on 12/06/2024 for Postural dizziness with presyncope [R42, R55] AKI (acute kidney injury) [N17.9]  Acute kidney injury on chronic kidney disease stage IV. Baseline creatinine appears to be 2.62 with GFR 23 on 01/06/24, during recent hospitalization at [facility]. Renal US  negative for obstruction. Workup in progress, etiology unknown. Received NS bolus in ED. Holding diuretics. No acute indication for dialysis.  Would encourage gentle IV hydration with fluid volume monitoring.   2. Chronic systolic heart failure, ECHO 08/17/24 shows EF 25-30% with large pleural effusion, TVR mild or moderate, mild aortic valve calcification.   3. Anemia of chronic kidney  disease Lab Results  Component Value Date   HGB 13.5 12/07/2024    Hgb stable  4. Diabetes mellitus type II with chronic kidney disease/renal manifestations: insulin  dependent. Home regimen includes lantus . Most recent hemoglobin A1c is 7.3 on 07/07/24.   5. Hypertension with chronic kidney disease. Home regimen Metolazone , entresto , spironolactone , torsemide , and carvedilol .Only receiving carvedilol .   LOS: 0 [name] 1/30/20261:42 PM     [1]  Allergies Allergen Reactions   Angiotensin Receptor Blockers     hyperkalemia   Metformin Diarrhea   "

## 2024-12-07 NOTE — Progress Notes (Addendum)
 [facility] Progress Note  Patient: [name]    FMW:[id number]  DOA: 12/06/2024     Date of Service: the patient was seen and examined on 12/07/2024  Chief Complaint  Patient presents with   Dizziness   Brief hospital course: [name] is a 89 y.o. male with medical history significant for PAF on Eliquis , CAD s/p CABG x5 and multiple stents, HFrEF secondary to ischemic cardiomyopathy (EF 25-30% 12/2023), heart block s/p pacemaker, CKD stage IV, prior GI bleed, multiple admissions for CHF exacerbation, most recently 11/16/2024  -11/23/2024 now being admitted with presyncope, and AKI on CKD suspect secondary to overdiuresis. He presented with weakness and lightheadedness on awaking earlier today.  He denied chest pain, palpitations, lower extremity edema. In the ED, BP 102/57 with otherwise normal vitals. Labs notable for troponin 156 (recently 110-124), creatinine 4.44 up from 2.38 a couple weeks prior, with anion gap of 17 and bicarb of 19.  Other labs for the most part at baseline. EKG showed paced rhythm at 66 Chest x-ray clear CT head nonacute with chronic microvascular changes   Patient given an NS bolus 1 L   Admission requested      Assessment and Plan:  # Postural dizziness with presyncope Secondary to dehydration, patient was overdiuresed Check orthostatic Ambulate with assistance Monitor BP and titrate medications according  # AKI superimposed on CKD 3B # High anion gap metabolic acidosis creatinine 4.44 up from 2.38 a couple weeks prior, with anion gap of 17 and bicarb of 19 Likely secondary to dehydration from overdiuresis (previously on metolazone .  Currently on spironolactone  and torsemide ) Received an NS bolus in the ED S/p bicarb IV infusion, acidosis resolved, infusion discontinued Will hold GDMT as BP will tolerate(specifically torsemide , spironolactone , Entresto ), to reintroduce as appropriate Monitor renal function and avoid nephrotoxins Bladder scan  rule out urinary retention US  Renal:  No hydronephrosis. Multiple   renal cortical cysts Nephrology consulted monitor renal functions and urine output Daily    # Chronic systolic CHF (congestive heart failure) (HCC) As above, holding GDMT pending renal recovery and improved blood pressure Daily weights Monitor for fluid overload in view of gentle hydration to treat AKI   # CAD, s/p CABG x 5 Elevated troponin Troponin elevated but at baseline Hold apixaban  Continue rosuvastatin    # Paroxysmal atrial fibrillation (HCC) Continue Eliquis , amiodarone  and  1/30 held Coreg  due to low BP    # Type II diabetes mellitus with renal manifestations (HCC) Sliding scale insulin  coverage     # Complete heart block s/p pacemaker (HCC) No acute issues suspected   # History of GI bleed No acute disease suspected     Body mass index is 31.32 kg/m.  Interventions:  Diet: Heart healthy diet DVT Prophylaxis: Eliquis   Advance goals of care discussion: Full code  Family Communication: family was not present at bedside, at the time of interview.  The pt provided permission to discuss medical plan with the family. Opportunity was given to ask question and all questions were answered satisfactorily.   Disposition:  Pt is from Home, admitted with presyncope, AKI due to dehydration, still has elevated creatinine, which precludes a safe discharge. Discharge to home, when stable, may need few days to improve.  Subjective: No significant events overnight.  Patient was laying comfortably, denied any complaints at this time.  Physical Exam: General: NAD, lying comfortably Appear in no distress, affect appropriate Eyes: PERRLA ENT: Oral Mucosa Clear, moist  Neck: no JVD,  Cardiovascular: S1 and  S2 Present, no Murmur,  Respiratory: good respiratory effort, Bilateral Air entry equal and Decreased, no Crackles, no wheezes Abdomen: Bowel Sound present, Soft and no tenderness,  Skin: no  rashes Extremities: no Pedal edema, no calf tenderness Neurologic: without any new focal findings Gait not checked due to patient safety concerns  Vitals:   12/07/24 0400 12/07/24 0500 12/07/24 0600 12/07/24 0700  BP: 99/61 107/60 108/65 109/61  Pulse: 62 62 66 (!) 58  Resp: 14 18 18 13   Temp:   98.3 F (36.8 C)   TempSrc:   Oral   SpO2: 100% 100% 100% 100%  Weight:      Height:       No intake or output data in the 24 hours ending 12/07/24 0846 Filed Weights   12/06/24 2049  Weight: 93.4 kg    Data Reviewed: I have personally reviewed and interpreted daily labs, tele strips, imagings as discussed above. I reviewed all nursing notes, pharmacy notes, vitals, pertinent old records I have discussed plan of care as described above with RN and patient/family.  CBC: Recent Labs  Lab 12/06/24 2056 12/07/24 0449  WBC 6.7 6.5  HGB 14.2 13.5  HCT 45.8 42.3  MCV 87.2 86.3  PLT 168 151   Basic Metabolic Panel: Recent Labs  Lab 12/06/24 2056 12/07/24 0449  NA 131* 132*  K 3.8 3.5  CL 95* 95*  CO2 19* 26  GLUCOSE 193* 188*  BUN 93* 90*  CREATININE 4.44* 4.29*  CALCIUM  8.5* 8.0*    Studies: CT Head Wo Contrast Result Date: 12/06/2024 EXAM: CT HEAD WITHOUT CONTRAST 12/06/2024 09:46:36 PM TECHNIQUE: CT of the head was performed without the administration of intravenous contrast. Automated exposure control, iterative reconstruction, and/or weight based adjustment of the mA/kV was utilized to reduce the radiation dose to as low as reasonably achievable. COMPARISON: 10/15/2024 CLINICAL HISTORY: Syncope/presyncope; cerebrovascular cause suspected. FINDINGS: BRAIN AND VENTRICLES: No acute hemorrhage. No evidence of acute infarct. Chronic right basal ganglia infarct. Age-related cerebral volume loss and moderate periventricular white matter disease. No extra-axial collection. No mass effect or midline shift. ORBITS: No acute abnormality. Bilateral lens replacement. SINUSES: No acute  abnormality. SOFT TISSUES AND SKULL: No acute soft tissue abnormality. No skull fracture. Moderate calcific atheromatous disease within carotid siphons. IMPRESSION: 1. Chronic ischemic changes without acute abnormality. Electronically signed by: Oneil Devonshire MD 12/06/2024 09:56 PM EST RP Workstation: [id number]   DG Chest 1 View Result Date: 12/06/2024 EXAM: 2 VIEW(S) XRAY OF THE CHEST 12/06/2024 09:17:00 PM COMPARISON: Chest radiograph 11/16/2024 and CT chest 10/15/2024. CLINICAL HISTORY: Dizziness. FINDINGS: LUNGS AND PLEURA: Lungs are clear. No pleural effusion. No pneumothorax. HEART AND MEDIASTINUM: Postoperative changes in the mediastinum. Cardiac pacemaker. Heart size and pulmonary vascularity are normal. Mediastinal contours appear intact. BONES AND SOFT TISSUES: Heterogeneous calcification projecting over the right chest laterally corresponds to subscapular soft tissue calcification on prior CT chest. No change since prior study. No acute osseous abnormality. IMPRESSION: 1. No acute cardiopulmonary abnormality. Electronically signed by: Elsie Gravely MD 12/06/2024 09:21 PM EST RP Workstation: [id number]    Scheduled Meds:  amiodarone   200 mg Oral BID   apixaban   2.5 mg Oral BID   carvedilol   6.25 mg Oral BID WC   magnesium  oxide  400 mg Oral Daily   rosuvastatin   20 mg Oral Daily   tamsulosin   0.4 mg Oral Daily   Continuous Infusions: PRN Meds: acetaminophen  **OR** acetaminophen , albuterol , HYDROcodone -acetaminophen , nitroGLYCERIN , ondansetron  **OR** ondansetron  (ZOFRAN ) IV  Time spent: 59  minutes  Author: ELVAN SOR. MD [facility] 12/07/2024 8:46 AM  To reach On-call, see care teams to locate the attending and reach out to them via [email or web address]. If 7PM-7AM, please contact night-coverage If you still have difficulty reaching the attending provider, please page the [facility] (Director on Call) for [facility] on amion for assistance.

## 2024-12-08 DIAGNOSIS — R55 Syncope and collapse: Secondary | ICD-10-CM | POA: Diagnosis not present

## 2024-12-08 DIAGNOSIS — R42 Dizziness and giddiness: Secondary | ICD-10-CM | POA: Diagnosis not present

## 2024-12-08 LAB — BASIC METABOLIC PANEL WITH GFR
Anion gap: 12 (ref 5–15)
BUN: 84 mg/dL — ABNORMAL HIGH (ref 8–23)
CO2: 25 mmol/L (ref 22–32)
Calcium: 8.2 mg/dL — ABNORMAL LOW (ref 8.9–10.3)
Chloride: 100 mmol/L (ref 98–111)
Creatinine, Ser: 3.8 mg/dL — ABNORMAL HIGH (ref 0.61–1.24)
GFR, Estimated: 15 mL/min — ABNORMAL LOW
Glucose, Bld: 129 mg/dL — ABNORMAL HIGH (ref 70–99)
Potassium: 3.8 mmol/L (ref 3.5–5.1)
Sodium: 137 mmol/L (ref 135–145)

## 2024-12-08 MED ORDER — SODIUM CHLORIDE 0.9 % IV SOLN: INTRAVENOUS | Status: DC

## 2024-12-08 MED ORDER — VITAMIN D (ERGOCALCIFEROL) 1.25 MG (50000 UNIT) PO CAPS
50000.0000 [IU] | ORAL_CAPSULE | ORAL | Status: DC
  Administered 2024-12-08: 50000 [IU] via ORAL
  Filled 2024-12-08: qty 1

## 2024-12-08 NOTE — Progress Notes (Signed)
 [facility] Progress Note  Patient: [name]    FMW:[id number]  DOA: 12/06/2024     Date of Service: the patient was seen and examined on 12/08/2024  Chief Complaint  Patient presents with   Dizziness   Brief hospital course: Albie Arizpe is a 89 y.o. male with medical history significant for PAF on Eliquis , CAD s/p CABG x5 and multiple stents, HFrEF secondary to ischemic cardiomyopathy (EF 25-30% 12/2023), heart block s/p pacemaker, CKD stage IV, prior GI bleed, multiple admissions for CHF exacerbation, most recently 11/16/2024  -11/23/2024 now being admitted with presyncope, and AKI on CKD suspect secondary to overdiuresis. He presented with weakness and lightheadedness on awaking earlier today.  He denied chest pain, palpitations, lower extremity edema. In the ED, BP 102/57 with otherwise normal vitals. Labs notable for troponin 156 (recently 110-124), creatinine 4.44 up from 2.38 a couple weeks prior, with anion gap of 17 and bicarb of 19.  Other labs for the most part at baseline. EKG showed paced rhythm at 66 Chest x-ray clear CT head nonacute with chronic microvascular changes   Patient given an NS bolus 1 L   Admission requested      Assessment and Plan:  # Postural dizziness with presyncope Secondary to dehydration, patient was overdiuresed Check orthostatic Ambulate with assistance Monitor BP and titrate medications according  # AKI superimposed on CKD 3B # High anion gap metabolic acidosis creatinine 4.44 up from 2.38 a couple weeks prior, with anion gap of 17 and bicarb of 19 Likely secondary to dehydration from overdiuresis (previously on metolazone .  Currently on spironolactone  and torsemide ) Received an NS bolus in the ED S/p bicarb IV infusion, acidosis resolved, infusion discontinued Will hold GDMT as BP will tolerate(specifically torsemide , spironolactone , Entresto ), to reintroduce as appropriate Monitor renal function and avoid nephrotoxins Bladder scan  rule out urinary retention US  Renal:  No hydronephrosis. Multiple   renal cortical cysts Nephrology consulted monitor renal functions and urine output Daily 1/31 decreased NS 50 mL/h for 1 day to continue gentle hydration and to avoid volume overload.  Monitor renal functions.  Creatinine gradually improving.  # Chronic systolic CHF (congestive heart failure) (HCC) As above, holding GDMT pending renal recovery and improved blood pressure Daily weights Monitor for fluid overload in view of gentle hydration to treat AKI   # CAD, s/p CABG x 5 Elevated troponin Troponin elevated but at baseline Continue rosuvastatin    # Paroxysmal atrial fibrillation (HCC) Continue Eliquis , amiodarone  and  1/30 held Coreg  due to low BP    # Type II diabetes mellitus with renal manifestations (HCC) Sliding scale insulin  coverage     # Complete heart block s/p pacemaker (HCC) No acute issues suspected   # History of GI bleed No acute disease suspected   # Vitamin D  deficiency: Vit D level 10.4, started vitamin D  50,000 units p.o. weekly, follow with PCP to repeat vitamin D  level after 3 to 6 months.  Debility due to advanced age Follow PT and OT eval, ambulate with assistance, continue fall precautions, turn patient every 2 hours Follow TOC for dispo plan   Body mass index is 25.51 kg/m.  Interventions:  Diet: Heart healthy diet DVT Prophylaxis: Eliquis   Advance goals of care discussion: Full code  Family Communication: family was not present at bedside, at the time of interview.  The pt provided permission to discuss medical plan with the family. Opportunity was given to ask question and all questions were answered satisfactorily.   Disposition:  Pt is from Home, admitted with presyncope, AKI due to dehydration, still has elevated creatinine, which precludes a safe discharge. Discharge to home, when stable, may need few days to improve.  Subjective: No significant events overnight.   Patient was laying comfortably, denied any complaints at this time.  Physical Exam: General: NAD, lying comfortably Appear in no distress, affect appropriate Eyes: PERRLA ENT: Oral Mucosa Clear, moist  Neck: no JVD,  Cardiovascular: S1 and S2 Present, no Murmur,  Respiratory: good respiratory effort, Bilateral Air entry equal and Decreased, no Crackles, no wheezes Abdomen: Bowel Sound present, Soft and no tenderness,  Skin: no rashes Extremities: no Pedal edema, no calf tenderness Neurologic: without any new focal findings Gait not checked due to patient safety concerns  Vitals:   12/08/24 0439 12/08/24 0444 12/08/24 0500 12/08/24 0830  BP: 108/62   (!) 115/53  Pulse: 61   (!) 59  Resp: 17   16  Temp: (!) 97.1 F (36.2 C) (!) 97.5 F (36.4 C)  98.3 F (36.8 C)  TempSrc:  Oral    SpO2: 99%   100%  Weight:   76.1 kg   Height:        Intake/Output Summary (Last 24 hours) at 12/08/2024 1507 Last data filed at 12/08/2024 0900 Gross per 24 hour  Intake 288.77 ml  Output 464 ml  Net -175.23 ml   Filed Weights   12/06/24 2049 12/08/24 0500  Weight: 93.4 kg 76.1 kg    Data Reviewed: I have personally reviewed and interpreted daily labs, tele strips, imagings as discussed above. I reviewed all nursing notes, pharmacy notes, vitals, pertinent old records I have discussed plan of care as described above with RN and patient/family.  CBC: Recent Labs  Lab 12/06/24 2056 12/07/24 0449  WBC 6.7 6.5  HGB 14.2 13.5  HCT 45.8 42.3  MCV 87.2 86.3  PLT 168 151   Basic Metabolic Panel: Recent Labs  Lab 12/06/24 2056 12/07/24 0449 12/08/24 0817  NA 131* 132* 137  K 3.8 3.5 3.8  CL 95* 95* 100  CO2 19* 26 25  GLUCOSE 193* 188* 129*  BUN 93* 90* 84*  CREATININE 4.44* 4.29* 3.80*  CALCIUM  8.5* 8.0* 8.2*  MG  --  1.8  --   PHOS  --  3.2  --     Studies: No results found.   Scheduled Meds:  amiodarone   200 mg Oral BID   apixaban   2.5 mg Oral BID   magnesium  oxide   400 mg Oral Daily   rosuvastatin   20 mg Oral Daily   tamsulosin   0.4 mg Oral Daily   Vitamin D  (Ergocalciferol )  50,000 Units Oral Q7 days   Continuous Infusions: PRN Meds: acetaminophen  **OR** acetaminophen , albuterol , HYDROcodone -acetaminophen , nitroGLYCERIN , ondansetron  **OR** ondansetron  (ZOFRAN ) IV  Time spent: 55 minutes  Author: ELVAN SOR. MD [facility] 12/08/2024 3:07 PM  To reach On-call, see care teams to locate the attending and reach out to them via [email or web address]. If 7PM-7AM, please contact night-coverage If you still have difficulty reaching the attending provider, please page the [facility] (Director on Call) for [facility] on amion for assistance.

## 2024-12-08 NOTE — Plan of Care (Signed)
   Problem: Clinical Measurements: Goal: Will remain free from infection Outcome: Progressing Goal: Diagnostic test results will improve Outcome: Progressing

## 2024-12-08 NOTE — Plan of Care (Signed)
   Problem: Education: Goal: Knowledge of General Education information will improve Description Including pain rating scale, medication(s)/side effects and non-pharmacologic comfort measures Outcome: Progressing

## 2024-12-08 NOTE — Progress Notes (Signed)
 " [facility]  ROUNDING NOTE   Subjective:   Patient seen sitting up in bed Eating breakfast Continues to deny nausea, vomiting and shortness of breath Room air No lower extremity edema  Objective:  Vital signs in last 24 hours:  Temp:  [97.1 F (36.2 C)-98.3 F (36.8 C)] 98.3 F (36.8 C) (01/31 0830) Pulse Rate:  [59-64] 59 (01/31 0830) Resp:  [16-20] 16 (01/31 0830) BP: (94-115)/(52-62) 115/53 (01/31 0830) SpO2:  [99 %-100 %] 100 % (01/31 0830) Weight:  [76.1 kg] 76.1 kg (01/31 0500)  Weight change: -17.3 kg Filed Weights   12/06/24 2049 12/08/24 0500  Weight: 93.4 kg 76.1 kg    Intake/Output: I/O last 3 completed shifts: In: 48.8 [I.V.:48.8] Out: 464 [Urine:464]   Intake/Output this shift:  Total I/O In: 240 [P.O.:240] Out: -   Physical Exam: General: NAD  Head: Normocephalic, atraumatic. Moist oral mucosal membranes  Eyes: Anicteric  Lungs:  Clear to auscultation  Heart: Regular rate and rhythm  Abdomen:  Soft, nontender  Extremities:  No peripheral edema.  Neurologic: Awake, alert, conversant  Skin: Warm,dry, no rash  Access: None    Basic Metabolic Panel: Recent Labs  Lab 12/06/24 2056 12/07/24 0449 12/08/24 0817  NA 131* 132* 137  K 3.8 3.5 3.8  CL 95* 95* 100  CO2 19* 26 25  GLUCOSE 193* 188* 129*  BUN 93* 90* 84*  CREATININE 4.44* 4.29* 3.80*  CALCIUM  8.5* 8.0* 8.2*  MG  --  1.8  --   PHOS  --  3.2  --     Liver Function Tests: No results for input(s): AST, ALT, ALKPHOS, BILITOT, PROT, ALBUMIN  in the last 168 hours. No results for input(s): LIPASE, AMYLASE in the last 168 hours. No results for input(s): AMMONIA in the last 168 hours.  CBC: Recent Labs  Lab 12/06/24 2056 12/07/24 0449  WBC 6.7 6.5  HGB 14.2 13.5  HCT 45.8 42.3  MCV 87.2 86.3  PLT 168 151    Cardiac Enzymes: No results for input(s): CKTOTAL, CKMB, CKMBINDEX, TROPONINI in the last 168 hours.  BNP: Invalid input(s):  POCBNP  CBG: Recent Labs  Lab 12/06/24 2103  GLUCAP 185*    Microbiology: Results for orders placed or performed during the hospital encounter of 12/06/24  Resp panel by RT-PCR (RSV, Flu A&B, Covid) Anterior Nasal Swab     Status: None   Collection Time: 12/06/24 10:33 PM   Specimen: Anterior Nasal Swab  Result Value Ref Range Status   SARS Coronavirus 2 by RT PCR NEGATIVE NEGATIVE Final    Comment: (NOTE) SARS-CoV-2 target nucleic acids are NOT DETECTED.  The SARS-CoV-2 RNA is generally detectable in upper respiratory specimens during the acute phase of infection. The lowest concentration of SARS-CoV-2 viral copies this assay can detect is 138 copies/mL. A negative result does not preclude SARS-Cov-2 infection and should not be used as the sole basis for treatment or other patient management decisions. A negative result may occur with  improper specimen collection/handling, submission of specimen other than nasopharyngeal swab, presence of viral mutation(s) within the areas targeted by this assay, and inadequate number of viral copies(<138 copies/mL). A negative result must be combined with clinical observations, patient history, and epidemiological information. The expected result is Negative.  Fact Sheet for Patients:  bloggercourse.com  Fact Sheet for Healthcare Providers:  seriousbroker.it  This test is no t yet approved or cleared by the [place]  FDA and  has been authorized for detection and/or diagnosis  of SARS-CoV-2 by FDA under an Emergency Use Authorization (EUA). This EUA will remain  in effect (meaning this test can be used) for the duration of the COVID-19 declaration under Section 564(b)(1) of the Act, 21 U.S.C.section 360bbb-3(b)(1), unless the authorization is terminated  or revoked sooner.       Influenza A by PCR NEGATIVE NEGATIVE Final   Influenza B by PCR NEGATIVE NEGATIVE Final    Comment:  (NOTE) The Xpert Xpress SARS-CoV-2/FLU/RSV plus assay is intended as an aid in the diagnosis of influenza from Nasopharyngeal swab specimens and should not be used as a sole basis for treatment. Nasal washings and aspirates are unacceptable for Xpert Xpress SARS-CoV-2/FLU/RSV testing.  Fact Sheet for Patients: bloggercourse.com  Fact Sheet for Healthcare Providers: seriousbroker.it  This test is not yet approved or cleared by the [place]  FDA and has been authorized for detection and/or diagnosis of SARS-CoV-2 by FDA under an Emergency Use Authorization (EUA). This EUA will remain in effect (meaning this test can be used) for the duration of the COVID-19 declaration under Section 564(b)(1) of the Act, 21 U.S.C. section 360bbb-3(b)(1), unless the authorization is terminated or revoked.     Resp Syncytial Virus by PCR NEGATIVE NEGATIVE Final    Comment: (NOTE) Fact Sheet for Patients: bloggercourse.com  Fact Sheet for Healthcare Providers: seriousbroker.it  This test is not yet approved or cleared by the [place]  FDA and has been authorized for detection and/or diagnosis of SARS-CoV-2 by FDA under an Emergency Use Authorization (EUA). This EUA will remain in effect (meaning this test can be used) for the duration of the COVID-19 declaration under Section 564(b)(1) of the Act, 21 U.S.C. section 360bbb-3(b)(1), unless the authorization is terminated or revoked.  Performed at [facility], [address] [place], [place] [address]     Coagulation Studies: No results for input(s): LABPROT, INR in the last 72 hours.  Urinalysis: No results for input(s): COLORURINE, LABSPEC, PHURINE, GLUCOSEU, HGBUR, BILIRUBINUR, KETONESUR, PROTEINUR, UROBILINOGEN, NITRITE, LEUKOCYTESUR in the last 72 hours.  Invalid input(s): APPERANCEUR     Imaging: US  RENAL Result Date: 12/07/2024 EXAM: RETROPERITONEAL ULTRASOUND OF THE KIDNEYS 12/07/2024 09:53:10 AM TECHNIQUE: Real-time ultrasonography of the retroperitoneum, specifically the kidneys and urinary bladder, was performed. COMPARISON: CT dated 10/15/2024. CLINICAL HISTORY: Acute kidney injury (AKI). FINDINGS: RIGHT KIDNEY: Right kidney measures 11.5 x 6.7 x 6.2 cm. Normal cortical echogenicity. No hydronephrosis. No calculus. Multiple right renal cortical cysts, largest 10.3 cm from the lower pole. LEFT KIDNEY: Left kidney measures 11.8 x 6.3 x 5.2 cm. Normal cortical echogenicity. No hydronephrosis. No calculus. Multiple left renal cortical cysts, largest 3.8 cm mid pole. BLADDER: Urinary bladder physiologically distended. LIVER: A 3.4 cm simple appearing right hepatic cyst is noted, consistent with findings on the prior CT. IMPRESSION: 1. No hydronephrosis. 2. Multiple   renal cortical cysts. Electronically signed by: Dayne Hassell MD 12/07/2024 10:14 AM EST RP Workstation: [id number]   CT Head Wo Contrast Result Date: 12/06/2024 EXAM: CT HEAD WITHOUT CONTRAST 12/06/2024 09:46:36 PM TECHNIQUE: CT of the head was performed without the administration of intravenous contrast. Automated exposure control, iterative reconstruction, and/or weight based adjustment of the mA/kV was utilized to reduce the radiation dose to as low as reasonably achievable. COMPARISON: 10/15/2024 CLINICAL HISTORY: Syncope/presyncope; cerebrovascular cause suspected. FINDINGS: BRAIN AND VENTRICLES: No acute hemorrhage. No evidence of acute infarct. Chronic right basal ganglia infarct. Age-related cerebral volume loss and moderate periventricular white matter disease. No extra-axial collection. No mass effect or midline shift.  ORBITS: No acute abnormality. Bilateral lens replacement. SINUSES: No acute abnormality. SOFT TISSUES AND SKULL: No acute soft tissue abnormality. No skull fracture. Moderate calcific atheromatous  disease within carotid siphons. IMPRESSION: 1. Chronic ischemic changes without acute abnormality. Electronically signed by: Oneil Devonshire MD 12/06/2024 09:56 PM EST RP Workstation: [id number]   DG Chest 1 View Result Date: 12/06/2024 EXAM: 2 VIEW(S) XRAY OF THE CHEST 12/06/2024 09:17:00 PM COMPARISON: Chest radiograph 11/16/2024 and CT chest 10/15/2024. CLINICAL HISTORY: Dizziness. FINDINGS: LUNGS AND PLEURA: Lungs are clear. No pleural effusion. No pneumothorax. HEART AND MEDIASTINUM: Postoperative changes in the mediastinum. Cardiac pacemaker. Heart size and pulmonary vascularity are normal. Mediastinal contours appear intact. BONES AND SOFT TISSUES: Heterogeneous calcification projecting over the right chest laterally corresponds to subscapular soft tissue calcification on prior CT chest. No change since prior study. No acute osseous abnormality. IMPRESSION: 1. No acute cardiopulmonary abnormality. Electronically signed by: Elsie Gravely MD 12/06/2024 09:21 PM EST RP Workstation: [id number]     Medications:     amiodarone   200 mg Oral BID   apixaban   2.5 mg Oral BID   magnesium  oxide  400 mg Oral Daily   rosuvastatin   20 mg Oral Daily   tamsulosin   0.4 mg Oral Daily   Vitamin D  (Ergocalciferol )  50,000 Units Oral Q7 days   acetaminophen  **OR** acetaminophen , albuterol , HYDROcodone -acetaminophen , nitroGLYCERIN , ondansetron  **OR** ondansetron  (ZOFRAN ) IV  Assessment/ Plan:  [name] is a 89 y.o.  male with past medical history of CAD s/p CABG, GIB, PAF on eliquis , CHF, and chronic kidney disease stage IV., who was admitted to [facility] on 12/06/2024 for AKI (acute kidney injury) [N17.9] Postural dizziness with presyncope [R42, R55]   Acute kidney injury on chronic kidney disease stage IV. Baseline creatinine appears to be 2.62 with GFR 23 on 01/06/24, during recent hospitalization at [facility]. Renal US  negative for obstruction. Workup in progress, etiology unknown. Likely aggressive diuresis  in outpatient setting Creatinine has improved with IVF. Due to adequate oral intake, agree with stopping IVF. Will continue to monitor for now. No acute indication for dialysis.   Lab Results  Component Value Date   CREATININE 3.80 (H) 12/08/2024   CREATININE 4.29 (H) 12/07/2024   CREATININE 4.44 (H) 12/06/2024    Intake/Output Summary (Last 24 hours) at 12/08/2024 1103 Last data filed at 12/08/2024 0900 Gross per 24 hour  Intake 288.77 ml  Output 464 ml  Net -175.23 ml   2. Chronic systolic heart failure, ECHO 08/17/24 shows EF 25-30% with large pleural effusion, TVR mild or moderate, mild aortic valve calcification.   3. Anemia of chronic kidney disease  Lab Results  Component Value Date   HGB 13.5 12/07/2024    Hgb acceptable for renal patient.   4. Diabetes mellitus type II with chronic kidney disease/renal manifestations: insulin  dependent. Home regimen includes lantus . Most recent hemoglobin A1c is 7.3 on 07/07/24.    5. Hypertension with chronic kidney disease. Home regimen Metolazone , entresto , spironolactone , torsemide , and carvedilol . All held   LOS: 1 Ysela Hettinger 1/31/202611:03 AM   "

## 2024-12-09 DIAGNOSIS — R42 Dizziness and giddiness: Secondary | ICD-10-CM | POA: Diagnosis not present

## 2024-12-09 DIAGNOSIS — R55 Syncope and collapse: Secondary | ICD-10-CM | POA: Diagnosis not present

## 2024-12-09 LAB — BASIC METABOLIC PANEL WITH GFR
Anion gap: 11 (ref 5–15)
BUN: 73 mg/dL — ABNORMAL HIGH (ref 8–23)
CO2: 26 mmol/L (ref 22–32)
Calcium: 8.2 mg/dL — ABNORMAL LOW (ref 8.9–10.3)
Chloride: 101 mmol/L (ref 98–111)
Creatinine, Ser: 2.98 mg/dL — ABNORMAL HIGH (ref 0.61–1.24)
GFR, Estimated: 20 mL/min — ABNORMAL LOW
Glucose, Bld: 156 mg/dL — ABNORMAL HIGH (ref 70–99)
Potassium: 4.2 mmol/L (ref 3.5–5.1)
Sodium: 137 mmol/L (ref 135–145)

## 2024-12-09 LAB — PHOSPHORUS: Phosphorus: 2.4 mg/dL — ABNORMAL LOW (ref 2.5–4.6)

## 2024-12-09 LAB — GLUCOSE, CAPILLARY
Glucose-Capillary: 179 mg/dL — ABNORMAL HIGH (ref 70–99)
Glucose-Capillary: 205 mg/dL — ABNORMAL HIGH (ref 70–99)

## 2024-12-09 LAB — MAGNESIUM: Magnesium: 1.6 mg/dL — ABNORMAL LOW (ref 1.7–2.4)

## 2024-12-09 MED ORDER — PREDNISONE 20 MG PO TABS
20.0000 mg | ORAL_TABLET | Freq: Every day | ORAL | Status: AC
  Administered 2024-12-09: 20 mg via ORAL
  Filled 2024-12-09: qty 1

## 2024-12-09 MED ORDER — MAGNESIUM SULFATE 2 GM/50ML IV SOLN
2.0000 g | Freq: Once | INTRAVENOUS | Status: AC
  Administered 2024-12-09: 2 g via INTRAVENOUS
  Filled 2024-12-09: qty 50

## 2024-12-09 MED ORDER — K PHOS MONO-SOD PHOS DI & MONO 155-852-130 MG PO TABS
500.0000 mg | ORAL_TABLET | Freq: Four times a day (QID) | ORAL | Status: AC
  Administered 2024-12-09 (×2): 500 mg via ORAL
  Filled 2024-12-09 (×2): qty 2

## 2024-12-09 MED ORDER — SODIUM CHLORIDE 0.9 % IV SOLN: INTRAVENOUS | Status: DC

## 2024-12-09 MED ORDER — PREDNISONE 10 MG PO TABS
10.0000 mg | ORAL_TABLET | Freq: Every day | ORAL | Status: AC
  Administered 2024-12-10: 10 mg via ORAL
  Filled 2024-12-09: qty 1

## 2024-12-09 MED ORDER — INSULIN ASPART 100 UNIT/ML IJ SOLN
0.0000 [IU] | Freq: Three times a day (TID) | INTRAMUSCULAR | Status: DC
  Administered 2024-12-09: 2 [IU] via SUBCUTANEOUS
  Administered 2024-12-10 (×2): 3 [IU] via SUBCUTANEOUS
  Administered 2024-12-10: 5 [IU] via SUBCUTANEOUS
  Administered 2024-12-11: 8 [IU] via SUBCUTANEOUS
  Administered 2024-12-11 – 2024-12-12 (×2): 3 [IU] via SUBCUTANEOUS
  Filled 2024-12-09: qty 8
  Filled 2024-12-09: qty 2
  Filled 2024-12-09: qty 4
  Filled 2024-12-09 (×2): qty 3
  Filled 2024-12-09: qty 5
  Filled 2024-12-09: qty 3

## 2024-12-09 MED ORDER — PREDNISONE 10 MG PO TABS
5.0000 mg | ORAL_TABLET | Freq: Every day | ORAL | Status: AC
  Administered 2024-12-11: 5 mg via ORAL
  Filled 2024-12-09: qty 1

## 2024-12-09 NOTE — Plan of Care (Signed)
   Problem: Education: Goal: Knowledge of General Education information will improve Description Including pain rating scale, medication(s)/side effects and non-pharmacologic comfort measures Outcome: Progressing

## 2024-12-09 NOTE — Evaluation (Signed)
 Physical Therapy Evaluation Patient Details Name: [name] MRN: [id number] DOB: 11/19/1934 Today's Date: 12/09/2024  History of Present Illness  [name] is a 89 y.o. male with medical history significant for PAF on Eliquis , CAD s/p CABG x5 and multiple stents, HFrEF secondary to ischemic cardiomyopathy (EF 25-30% 12/2023), heart block s/p pacemaker, CKD stage IV, prior GI bleed, multiple admissions for CHF exacerbation, most recently 11/16/2024  -11/23/2024 now being admitted with presyncope, and AKI on CKD suspect secondary to overdiuresis.  He presented with weakness and lightheadedness on awaking earlier today.  He denied chest pain, palpitations, lower extremity edema.  In the ED, BP 102/57 with otherwise normal vitals.  Clinical Impression  Patient noted to be in supine position at PT arrival in room, for an initial PT evaluation due to a decline in functional status, with baseline mobility reported as needing assistance for mobility, tranfers, bathing and dressing, and currently requiring modA for bed mobility and modA to stand with RW. The patient is A&O x 4, presenting with fair willingness to work with PT. The patient resides in a house and lives with spouse with family/friend support. There are no STE inside the residence. Gait was assessed with RW only able to take a few forward and side steps limited by R knee pain. The overall clinical impression is that the patient presents with moderate to severe mobility limitations. Recommended skilled PT will address safety, mobility, and discharge planning.        If plan is discharge home, recommend the following: A lot of help with walking and/or transfers;A lot of help with bathing/dressing/bathroom;Assistance with cooking/housework;Direct supervision/assist for medications management;Direct supervision/assist for financial management;Assist for transportation;Help with stairs or ramp for entrance   Can travel by private vehicle   No     Equipment Recommendations None recommended by PT  Recommendations for Other Services       Functional Status Assessment Patient has had a recent decline in their functional status and demonstrates the ability to make significant improvements in function in a reasonable and predictable amount of time.     Precautions / Restrictions Precautions Precautions: Fall Recall of Precautions/Restrictions: Intact Restrictions Weight Bearing Restrictions Per Provider Order: No      Mobility  Bed Mobility Overal bed mobility: Needs Assistance Bed Mobility: Rolling, Sidelying to Sit Rolling: Max assist Sidelying to sit: Mod assist            Transfers Overall transfer level: Needs assistance Equipment used: Rolling walker (2 wheels) Transfers: Sit to/from Stand Sit to Stand: From elevated surface, +2 physical assistance, Mod assist                Ambulation/Gait Ambulation/Gait assistance: Min assist Gait Distance (Feet): 5 Feet Assistive device: Rolling walker (2 wheels) Gait Pattern/deviations: Step-to pattern Gait velocity: decreased     General Gait Details: difficulty advancing R LE; Assist for RW management and weightshifting; only able to take few steps at bedside  Stairs            Wheelchair Mobility     Tilt Bed    Modified Rankin (Stroke Patients Only)       Balance Overall balance assessment: Needs assistance Sitting-balance support: Feet supported, No upper extremity supported Sitting balance-[name]: Good     Standing balance support: Bilateral upper extremity supported, During functional activity Standing balance-[name]: Poor  Pertinent Vitals/Pain Pain Assessment Faces Pain Scale: Hurts little more Pain Location: R knee Pain Descriptors / Indicators: Discomfort, Grimacing Pain Intervention(s): Monitored during session, Limited activity within patient's tolerance    Home Living  Family/patient expects to be discharged to:: Private residence Living Arrangements: Spouse/significant other;Children Available Help at Discharge: Family;Available 24 hours/day Type of Home: House Home Access: Ramped entrance       Home Layout: One level Home Equipment: Cane - single Librarian, Academic (2 wheels);Shower seat;Grab bars - toilet;Grab bars - tub/shower      Prior Function Prior Level of Function : Independent/Modified Independent             Mobility Comments: ambulatory with RW/SPC ADLs Comments: PRN assistance for bathing     Extremity/Trunk Assessment   Upper Extremity Assessment Upper Extremity Assessment: Generalized weakness    Lower Extremity Assessment Lower Extremity Assessment: Generalized weakness    Cervical / Trunk Assessment Cervical / Trunk Assessment: Kyphotic  Communication   Communication Communication: No apparent difficulties Factors Affecting Communication: Reduced clarity of speech    Cognition Arousal: Alert Behavior During Therapy: WFL for tasks assessed/performed   PT - Cognitive impairments: History of cognitive impairments                         Following commands: Intact Following commands impaired: Follows one step commands with increased time     Cueing Cueing Techniques: Verbal cues, Tactile cues     General Comments      Exercises     Assessment/Plan    PT Assessment Patient needs continued PT services  PT Problem List Decreased strength;Decreased balance;Decreased activity tolerance;Decreased mobility;Decreased knowledge of use of DME;Decreased safety awareness;Decreased knowledge of precautions;Cardiopulmonary status limiting activity;Pain       PT Treatment Interventions DME instruction;Gait training;Functional mobility training;Therapeutic activities;Therapeutic exercise;Balance training;Neuromuscular re-education;Patient/family education    PT Goals (Current goals can be found in the  Care Plan section)  Acute Rehab PT Goals Patient Stated Goal: reduce pain PT Goal Formulation: With patient Time For Goal Achievement: 12/30/24 Potential to Achieve Goals: Fair    Frequency Min 2X/week     Co-evaluation               AM-PAC PT 6 Clicks Mobility  Outcome Measure Help needed turning from your back to your side while in a flat bed without using bedrails?: A Little Help needed moving from lying on your back to sitting on the side of a flat bed without using bedrails?: A Little Help needed moving to and from a bed to a chair (including a wheelchair)?: A Little Help needed standing up from a chair using your arms (e.g., wheelchair or bedside chair)?: A Little Help needed to walk in hospital room?: A Lot Help needed climbing 3-5 steps with a railing? : A Lot 6 Click Score: 16    End of Session   Activity Tolerance: Patient limited by fatigue;Patient limited by pain Patient left: in bed;with call bell/phone within reach Nurse Communication: Mobility status PT Visit Diagnosis: Unsteadiness on feet (R26.81);Other abnormalities of gait and mobility (R26.89);Muscle weakness (generalized) (M62.81);Difficulty in walking, not elsewhere classified (R26.2)    Time: 8767-8755 PT Time Calculation (min) (ACUTE ONLY): 12 min   Charges:   PT Evaluation $PT Eval Low Complexity: 1 Low   PT General Charges $$ ACUTE PT VISIT: 1 Visit         Sherlean Lesches DPT, PT    Sherlean A Sebrina Kessner 12/09/2024,  12:58 PM

## 2024-12-09 NOTE — Progress Notes (Addendum)
 [facility] Progress Note  Patient: [name]    FMW:[id number]  DOA: 12/06/2024     Date of Service: the patient was seen and examined on 12/09/2024  Chief Complaint  Patient presents with   Dizziness   Brief hospital course: [name] is a 89 y.o. male with medical history significant for PAF on Eliquis , CAD s/p CABG x5 and multiple stents, HFrEF secondary to ischemic cardiomyopathy (EF 25-30% 12/2023), heart block s/p pacemaker, CKD stage IV, prior GI bleed, multiple admissions for CHF exacerbation, most recently 11/16/2024  -11/23/2024 now being admitted with presyncope, and AKI on CKD suspect secondary to overdiuresis. He presented with weakness and lightheadedness on awaking earlier today.  He denied chest pain, palpitations, lower extremity edema. In the ED, BP 102/57 with otherwise normal vitals. Labs notable for troponin 156 (recently 110-124), creatinine 4.44 up from 2.38 a couple weeks prior, with anion gap of 17 and bicarb of 19.  Other labs for the most part at baseline. EKG showed paced rhythm at 66 Chest x-ray clear CT head nonacute with chronic microvascular changes   Patient given an NS bolus 1 L   Admission requested      Assessment and Plan:  # Postural dizziness with presyncope Secondary to dehydration, patient was overdiuresed Check orthostatic Ambulate with assistance Monitor BP and titrate medications according  # AKI superimposed on CKD 3B # High anion gap metabolic acidosis creatinine 4.44 up from 2.38 a couple weeks prior, with anion gap of 17 and bicarb of 19 Likely secondary to dehydration from overdiuresis (previously on metolazone .  Currently on spironolactone  and torsemide ) Received an NS bolus in the ED S/p bicarb IV infusion, acidosis resolved, infusion discontinued Will hold GDMT as BP will tolerate(specifically torsemide , spironolactone , Entresto ), to reintroduce as appropriate Monitor renal function and avoid nephrotoxins Bladder scan  rule out urinary retention US  Renal:  No hydronephrosis. Multiple   renal cortical cysts Nephrology consulted monitor renal functions and urine output Daily 2/1 continue NS 50 mL/h for 1 day to continue gentle hydration and to avoid volume overload.  Monitor renal functions.  Creatinine gradually improving.   # Chronic systolic CHF (congestive heart failure) (HCC) As above, holding GDMT pending renal recovery and improved blood pressure Daily weights Monitor for fluid overload in view of gentle hydration to treat AKI   # CAD, s/p CABG x 5 Elevated troponin Troponin elevated but at baseline Continue rosuvastatin    # Paroxysmal atrial fibrillation (HCC) Continue Eliquis , amiodarone  and  1/30 held Coreg  due to low BP    # Type II diabetes mellitus with renal manifestations (HCC) Sliding scale insulin  coverage Monitor CBG, continue diabetic diet   # Complete heart block s/p pacemaker (HCC) No acute issues suspected   # History of GI bleed No acute disease suspected   # Vitamin D  deficiency: Vit D level 10.4, started vitamin D  50,000 units p.o. weekly, follow with PCP to repeat vitamin D  level after 3 to 6 months.  Debility due to advanced age Follow PT and OT eval, ambulate with assistance, continue fall precautions, turn patient every 2 hours Follow TOC for dispo plan  Osteoarthritis bilateral knee, complaining of right knee pain 2/1 started prednisone  tapering dose Continue Tylenol  as needed  Body mass index is 25.95 kg/m.  Interventions:  Diet: Heart healthy diet DVT Prophylaxis: Eliquis   Advance goals of care discussion: Full code  Family Communication: family was not present at bedside, at the time of interview.  The pt provided permission to discuss  medical plan with the family. Opportunity was given to ask question and all questions were answered satisfactorily.   Disposition:  Pt is from Home, admitted with presyncope, AKI due to dehydration, still has  elevated creatinine, which precludes a safe discharge. Discharge to home, when stable, may need few days to improve.  Subjective: No significant events overnight.  Patient was laying comfortably, denied any complaints at this time.  Physical Exam: General: NAD, lying comfortably Appear in no distress, affect appropriate Eyes: PERRLA ENT: Oral Mucosa Clear, moist  Neck: no JVD,  Cardiovascular: S1 and S2 Present, no Murmur,  Respiratory: good respiratory effort, Bilateral Air entry equal and Decreased, no Crackles, no wheezes Abdomen: Bowel Sound present, Soft and no tenderness,  Skin: no rashes Extremities: no Pedal edema, no calf tenderness Neurologic: without any new focal findings Gait not checked due to patient safety concerns  Vitals:   12/08/24 2034 12/09/24 0344 12/09/24 0500 12/09/24 0839  BP: 120/68 (!) 109/57  (!) 121/56  Pulse: 64 (!) 59  65  Resp: 18 18  16   Temp: 98.1 F (36.7 C) 97.8 F (36.6 C)  97.8 F (36.6 C)  TempSrc:      SpO2: 100% 100%  100%  Weight:   77.4 kg   Height:        Intake/Output Summary (Last 24 hours) at 12/09/2024 1342 Last data filed at 12/09/2024 0900 Gross per 24 hour  Intake 911.06 ml  Output 750 ml  Net 161.06 ml   Filed Weights   12/06/24 2049 12/08/24 0500 12/09/24 0500  Weight: 93.4 kg 76.1 kg 77.4 kg    Data Reviewed: I have personally reviewed and interpreted daily labs, tele strips, imagings as discussed above. I reviewed all nursing notes, pharmacy notes, vitals, pertinent old records I have discussed plan of care as described above with RN and patient/family.  CBC: Recent Labs  Lab 12/06/24 2056 12/07/24 0449  WBC 6.7 6.5  HGB 14.2 13.5  HCT 45.8 42.3  MCV 87.2 86.3  PLT 168 151   Basic Metabolic Panel: Recent Labs  Lab 12/06/24 2056 12/07/24 0449 12/08/24 0817 12/09/24 1026  NA 131* 132* 137 137  K 3.8 3.5 3.8 4.2  CL 95* 95* 100 101  CO2 19* 26 25 26   GLUCOSE 193* 188* 129* 156*  BUN 93* 90* 84*  73*  CREATININE 4.44* 4.29* 3.80* 2.98*  CALCIUM  8.5* 8.0* 8.2* 8.2*  MG  --  1.8  --  1.6*  PHOS  --  3.2  --  2.4*    Studies: No results found.   Scheduled Meds:  amiodarone   200 mg Oral BID   apixaban   2.5 mg Oral BID   insulin  aspart  0-15 Units Subcutaneous TID WC   magnesium  oxide  400 mg Oral Daily   phosphorus  500 mg Oral QID   predniSONE   20 mg Oral Q breakfast   Followed by   NOREEN ON 12/10/2024] predniSONE   10 mg Oral Q breakfast   Followed by   NOREEN ON 12/11/2024] predniSONE   5 mg Oral Q breakfast   rosuvastatin   20 mg Oral Daily   tamsulosin   0.4 mg Oral Daily   Vitamin D  (Ergocalciferol )  50,000 Units Oral Q7 days   Continuous Infusions:  sodium chloride  50 mL/hr at 12/09/24 1145   magnesium  sulfate bolus IVPB     PRN Meds: acetaminophen  **OR** acetaminophen , albuterol , HYDROcodone -acetaminophen , nitroGLYCERIN , ondansetron  **OR** ondansetron  (ZOFRAN ) IV  Time spent: 40 minutes  Author: ELVAN SOR.  MD [facility] 12/09/2024 1:42 PM  To reach On-call, see care teams to locate the attending and reach out to them via [email or web address]. If 7PM-7AM, please contact night-coverage If you still have difficulty reaching the attending provider, please page the [facility] (Director on Call) for [facility] on amion for assistance.

## 2024-12-10 DIAGNOSIS — R55 Syncope and collapse: Secondary | ICD-10-CM | POA: Diagnosis not present

## 2024-12-10 DIAGNOSIS — R42 Dizziness and giddiness: Secondary | ICD-10-CM | POA: Diagnosis not present

## 2024-12-10 LAB — BASIC METABOLIC PANEL WITH GFR
Anion gap: 10 (ref 5–15)
BUN: 70 mg/dL — ABNORMAL HIGH (ref 8–23)
CO2: 26 mmol/L (ref 22–32)
Calcium: 8 mg/dL — ABNORMAL LOW (ref 8.9–10.3)
Chloride: 99 mmol/L (ref 98–111)
Creatinine, Ser: 2.72 mg/dL — ABNORMAL HIGH (ref 0.61–1.24)
GFR, Estimated: 22 mL/min — ABNORMAL LOW
Glucose, Bld: 217 mg/dL — ABNORMAL HIGH (ref 70–99)
Potassium: 4 mmol/L (ref 3.5–5.1)
Sodium: 135 mmol/L (ref 135–145)

## 2024-12-10 LAB — GLUCOSE, CAPILLARY
Glucose-Capillary: 180 mg/dL — ABNORMAL HIGH (ref 70–99)
Glucose-Capillary: 201 mg/dL — ABNORMAL HIGH (ref 70–99)
Glucose-Capillary: 174 mg/dL — ABNORMAL HIGH (ref 70–99)
Glucose-Capillary: 195 mg/dL — ABNORMAL HIGH (ref 70–99)

## 2024-12-10 LAB — PHOSPHORUS: Phosphorus: 2.8 mg/dL (ref 2.5–4.6)

## 2024-12-10 LAB — MAGNESIUM: Magnesium: 2.1 mg/dL (ref 1.7–2.4)

## 2024-12-10 MED ORDER — SODIUM CHLORIDE 0.9 % IV SOLN: INTRAVENOUS | Status: DC

## 2024-12-10 MED ORDER — ORAL CARE MOUTH RINSE: 15.0000 mL | OROMUCOSAL | Status: DC | PRN

## 2024-12-10 MED ORDER — DICLOFENAC SODIUM 1 % EX GEL
2.0000 g | Freq: Two times a day (BID) | CUTANEOUS | Status: DC
  Administered 2024-12-10 – 2024-12-12 (×5): 2 g via TOPICAL
  Filled 2024-12-10: qty 100

## 2024-12-10 NOTE — Consult Note (Signed)
" °  CLINICAL SUPPORT TEAM - WOUND OSTOMY AND CONTINENCE TEAM  CONSULTATION SERVICES   WOC Nurse-Inpatient Note  WOC Nurse Consult Note: Reason for Consult: coccyx and buttock wounds  Wound type: 1. Stage 3 Pressure injury coccyx 80% red 20% tan  2.  Scattered Stage 3 Pressure Injuries B buttocks largely red moist  Pressure Injury POA: Yes Measurement: see nursing flowsheet  Wound bed: as above  Drainage (amount, consistency, odor) serosanguinous  Periwound: intact  Dressing procedure/placement/frequency: Cleanse coccyx and buttocks wounds with NS, apply silver hydrofiber (TI#[id number]/Ojtdnw #[id number]) to wound beds daily and secure with silicone foam. May lift foam daily to replace silver, change foam q3 days and prn soiling.    POC discussed with bedside nurse. WOC team will not follow. Reconsult if further needs arise.   Thank you,    Kaylea Mounsey MSN, RN-BC, CWOCN     "

## 2024-12-10 NOTE — Evaluation (Signed)
 Occupational Therapy Evaluation Patient Details Name: [name] MRN: [id number] DOB: [birth year] Today's Date: 12/10/2024   History of Present Illness   [name] is a 89 y.o. male with medical history significant for PAF on Eliquis , CAD s/p CABG x5 and multiple stents, HFrEF secondary to ischemic cardiomyopathy (EF 25-30% 12/2023), heart block s/p pacemaker, CKD stage IV, prior GI bleed, multiple admissions for CHF exacerbation, most recently 11/16/2024  -11/23/2024 now being admitted with presyncope, and AKI on CKD suspect secondary to overdiuresis.  He presented with weakness and lightheadedness on awaking earlier today.  He denied chest pain, palpitations, lower extremity edema.  In the ED, BP 102/57 with otherwise normal vitals.     Clinical Impressions Pt was seen for OT evaluation this date. Prior to hospital admission, pt was Mod I/I. Pt lives with spouse, daughter and grandson. Pt presents to acute OT demonstrating impaired ADL performance and functional mobility 2/2 decreased activity tolerance and functional strength/ROM/balance deficits.   Pt currently requires CGA for bed mobility. Pt Min a + RW for sit<>stand from EOB. Pt took approx ~3 steps to sink. Pt required CGA for washing face standing at sink ~3 min, limited by pain. Pt edu on safe use of DME, and energy conservation strategies when completing grooming tasks. Pt orthostatics were taken during session, denied symptoms. Pt would benefit from skilled OT to address noted impairments and functional limitations (see below for any additional details). Upon hospital discharge, recommend continued OT services.   Orthostatic VS for the past 24 hrs:  BP- Lying Pulse- Lying BP- Sitting Pulse- Sitting BP- Standing at 0 minutes Pulse- Standing at 0 minutes  12/10/24 1100 104/54 67 118/58 63 98/75 86       If plan is discharge home, recommend the following:   A little help with walking and/or transfers;A little help with  bathing/dressing/bathroom;Assistance with cooking/housework;Assist for transportation;Help with stairs or ramp for entrance     Functional Status Assessment   Patient has had a recent decline in their functional status and demonstrates the ability to make significant improvements in function in a reasonable and predictable amount of time.     Equipment Recommendations   BSC/3in1     Recommendations for Other Services         Precautions/Restrictions   Precautions Precautions: Fall Recall of Precautions/Restrictions: Intact Restrictions Weight Bearing Restrictions Per Provider Order: No     Mobility Bed Mobility Overal bed mobility: Needs Assistance Bed Mobility: Sit to Supine, Supine to Sit     Supine to sit: Contact guard, Used rails Sit to supine: Used rails, Contact guard assist        Transfers Overall transfer level: Needs assistance Equipment used: Rolling walker (2 wheels) Transfers: Sit to/from Stand Sit to Stand: Min assist                  Balance Overall balance assessment: Needs assistance Sitting-balance support: Feet supported, No upper extremity supported Sitting balance-[name]: Good     Standing balance support: During functional activity, Single extremity supported Standing balance-[name]: Fair                             ADL either performed or assessed with clinical judgement   ADL Overall ADL's : Needs assistance/impaired  General ADL Comments: Pt CGA for bed mobility. Pt Min a + RW for sit<>stand from EOB. Pt took a approx ~3 steps to sink. Pt required CGA for washing face standing at sink.     Vision         Perception         Praxis         Pertinent Vitals/Pain Pain Assessment Pain Assessment: 0-10 Pain Score: 5  Pain Location: R knee Pain Descriptors / Indicators: Sore, Discomfort, Aching Pain Intervention(s): Monitored during  session, Premedicated before session     Extremity/Trunk Assessment Upper Extremity Assessment Upper Extremity Assessment: Generalized weakness   Lower Extremity Assessment Lower Extremity Assessment: Generalized weakness       Communication Communication Communication: No apparent difficulties Factors Affecting Communication: Reduced clarity of speech   Cognition Arousal: Alert Behavior During Therapy: WFL for tasks assessed/performed Cognition: No apparent impairments                               Following commands: Intact Following commands impaired: Follows one step commands with increased time     Cueing  General Comments   Cueing Techniques: Verbal cues;Tactile cues  Pt orthostatics checked supine 104/54, sitting 118/58, stand 98/75. Pt was not symptomatic when standing.   Exercises     Shoulder Instructions      Home Living Family/patient expects to be discharged to:: Private residence Living Arrangements: Spouse/significant other;Children Available Help at Discharge: Family;Available 24 hours/day Type of Home: House Home Access: Ramped entrance     Home Layout: One level     Bathroom Shower/Tub: Producer, Television/film/video: Handicapped height Bathroom Accessibility: Yes   Home Equipment: Cane - single Librarian, Academic (2 wheels);Shower seat;Grab bars - toilet;Grab bars - tub/shower          Prior Functioning/Environment Prior Level of Function : Independent/Modified Independent               ADLs Comments: PRN assistance for bathing    OT Problem List: Decreased range of motion;Decreased strength;Decreased activity tolerance;Impaired balance (sitting and/or standing);Pain   OT Treatment/Interventions: Self-care/ADL training;Therapeutic exercise;Energy conservation;DME and/or AE instruction;Balance training;Patient/family education      OT Goals(Current goals can be found in the care plan section)   Acute Rehab  OT Goals Patient Stated Goal: go home OT Goal Formulation: With patient Time For Goal Achievement: 12/24/24 Potential to Achieve Goals: Good ADL Goals Pt Will Perform Upper Body Bathing: with modified independence;standing Pt Will Perform Lower Body Dressing: with supervision;sit to/from stand Pt Will Transfer to Toilet: with supervision;ambulating;regular height toilet   OT Frequency:  Min 2X/week    Co-evaluation              AM-PAC OT 6 Clicks Daily Activity     Outcome Measure Help from another person eating meals?: None Help from another person taking care of personal grooming?: A Little Help from another person toileting, which includes using toliet, bedpan, or urinal?: A Lot Help from another person bathing (including washing, rinsing, drying)?: A Lot Help from another person to put on and taking off regular upper body clothing?: A Little Help from another person to put on and taking off regular lower body clothing?: A Lot 6 Click Score: 16   End of Session Equipment Utilized During Treatment: Rolling walker (2 wheels)  Activity Tolerance: Patient tolerated treatment well Patient left: in bed;with call bell/phone within reach;with bed  alarm set  OT Visit Diagnosis: Unsteadiness on feet (R26.81);Other abnormalities of gait and mobility (R26.89);Muscle weakness (generalized) (M62.81)                Time: 8896-8874 OT Time Calculation (min): 22 min Charges:  OT General Charges $OT Visit: 1 Visit OT Evaluation $OT Eval Moderate Complexity: 1 Mod  Wael Maestas OTS   Jacquelynn Friend 12/10/2024, 1:20 PM

## 2024-12-11 ENCOUNTER — Other Ambulatory Visit: Payer: Self-pay

## 2024-12-11 LAB — GLUCOSE, CAPILLARY
Glucose-Capillary: 176 mg/dL — ABNORMAL HIGH (ref 70–99)
Glucose-Capillary: 277 mg/dL — ABNORMAL HIGH (ref 70–99)
Glucose-Capillary: 119 mg/dL — ABNORMAL HIGH (ref 70–99)

## 2024-12-11 LAB — BASIC METABOLIC PANEL WITH GFR
Anion gap: 9 (ref 5–15)
BUN: 65 mg/dL — ABNORMAL HIGH (ref 8–23)
CO2: 23 mmol/L (ref 22–32)
Calcium: 7 mg/dL — ABNORMAL LOW (ref 8.9–10.3)
Chloride: 107 mmol/L (ref 98–111)
Creatinine, Ser: 2.2 mg/dL — ABNORMAL HIGH (ref 0.61–1.24)
GFR, Estimated: 28 mL/min — ABNORMAL LOW
Glucose, Bld: 199 mg/dL — ABNORMAL HIGH (ref 70–99)
Potassium: 3.7 mmol/L (ref 3.5–5.1)
Sodium: 138 mmol/L (ref 135–145)

## 2024-12-11 MED ORDER — TORSEMIDE 20 MG PO TABS
40.0000 mg | ORAL_TABLET | Freq: Every day | ORAL | Status: DC
  Administered 2024-12-12: 40 mg via ORAL
  Filled 2024-12-11: qty 2

## 2024-12-11 MED ORDER — CARVEDILOL 3.125 MG PO TABS
3.1250 mg | ORAL_TABLET | Freq: Two times a day (BID) | ORAL | Status: DC
  Administered 2024-12-12: 3.125 mg via ORAL
  Filled 2024-12-11 (×2): qty 1

## 2024-12-11 MED ORDER — CARVEDILOL 3.125 MG PO TABS
3.1250 mg | ORAL_TABLET | Freq: Two times a day (BID) | ORAL | 11 refills | Status: AC
  Filled 2024-12-11: qty 60, 30d supply, fill #0

## 2024-12-11 MED ORDER — ACETAMINOPHEN 325 MG PO TABS: 650.0000 mg | ORAL_TABLET | Freq: Four times a day (QID) | ORAL | Status: AC | PRN

## 2024-12-11 MED ORDER — VITAMIN D (ERGOCALCIFEROL) 1.25 MG (50000 UNIT) PO CAPS
50000.0000 [IU] | ORAL_CAPSULE | ORAL | 0 refills | Status: AC
  Filled 2024-12-11: qty 12, 84d supply, fill #0

## 2024-12-11 NOTE — Plan of Care (Signed)

## 2024-12-11 NOTE — Plan of Care (Signed)

## 2024-12-11 NOTE — Progress Notes (Addendum)
 [facility] Progress Note  Patient: [name]    FMW:[id number]  DOA: 12/06/2024     Date of Service: the patient was seen and examined on 12/11/2024  Chief Complaint  Patient presents with   Dizziness   Brief hospital course: Ulises Wolfinger is a 89 y.o. male with medical history significant for PAF on Eliquis , CAD s/p CABG x5 and multiple stents, HFrEF secondary to ischemic cardiomyopathy (EF 25-30% 12/2023), heart block s/p pacemaker, CKD stage IV, prior GI bleed, multiple admissions for CHF exacerbation, most recently 11/16/2024  -11/23/2024 now being admitted with presyncope, and AKI on CKD suspect secondary to overdiuresis. He presented with weakness and lightheadedness on awaking earlier today.  He denied chest pain, palpitations, lower extremity edema. In the ED, BP 102/57 with otherwise normal vitals. Labs notable for troponin 156 (recently 110-124), creatinine 4.44 up from 2.38 a couple weeks prior, with anion gap of 17 and bicarb of 19.  Other labs for the most part at baseline. EKG showed paced rhythm at 66 Chest x-ray clear CT head nonacute with chronic microvascular changes   Patient given an NS bolus 1 L   Admission requested     Assessment and Plan:  # Postural dizziness with presyncope Secondary to dehydration, patient was overdiuresed 2/2 Bp dropped from sitting to standing position and patient was lightheaded with PT 2/3 repeat orthostatics negative today Ambulate with assistance Monitor BP and titrate medications according  # AKI superimposed on CKD 4 # High anion gap metabolic acidosis creatinine 4.44 up from 2.38 a couple weeks prior, with anion gap of 17 and bicarb of 19 Likely secondary to dehydration from overdiuresis (previously on metolazone .  Currently on spironolactone  and torsemide ) Received an NS bolus in the ED S/p bicarb IV infusion, acidosis resolved, infusion discontinued Will hold GDMT as BP will tolerate(specifically torsemide ,  spironolactone , Entresto ), to reintroduce as appropriate Monitor renal function and avoid nephrotoxins Bladder scan rule out urinary retention US  Renal:  No hydronephrosis. Multiple   renal cortical cysts Nephrology consulted monitor renal functions and urine output Daily 2/2 continue NS 50 mL/h for 1 day to continue gentle hydration and to avoid volume overload.  Monitor renal functions.  Creatinine gradually improving. sCr  4.44>>2.72>> 2.2 back to his baseline Renal functions gradually improved   # Chronic systolic CHF (congestive heart failure) (HCC) As above, holding GDMT pending renal recovery and improved blood pressure Daily weights Monitor for fluid overload in view of gentle hydration to treat AKI Cardiology consulted, recommended to start low-dose Coreg  3.125 mg p.o. twice daily and torsemide  40 mg p.o. daily on discharge.  Follow-up as an outpatient.   # CAD, s/p CABG x 5 Elevated troponin Troponin elevated but at baseline Continue rosuvastatin    # Paroxysmal atrial fibrillation (HCC) Continue Eliquis , amiodarone  and  1/30 held Coreg  due to low BP    # Type II diabetes mellitus with renal manifestations (HCC) Sliding scale insulin  coverage Monitor CBG, continue diabetic diet   # Complete heart block s/p pacemaker (HCC) No acute issues suspected 2/3 EKG and telemetry reviewed by cardiology, pacemaker is functioning well.  No active issues, follow-up as an outpatient   # History of GI bleed No acute disease suspected   # Vitamin D  deficiency: Vit D level 10.4, started vitamin D  50,000 units p.o. weekly, follow with PCP to repeat vitamin D  level after 3 to 6 months.  Debility due to advanced age PT and OT eval done, recommend SNF placement, patient refused, home health will  be arranged by TOC.  Osteoarthritis bilateral knee, complaining of right knee pain 2/1 started prednisone  tapering dose Continue Tylenol  as needed Voltaren  gel twice daily   Body mass index  is 25.95 kg/m.  Interventions:  Diet: Heart healthy diet DVT Prophylaxis: Eliquis   Advance goals of care discussion: Full code  Family Communication: family was not present at bedside, at the time of interview.  The pt provided permission to discuss medical plan with the family. Opportunity was given to ask question and all questions were answered satisfactorily.   Disposition:  Pt is from Home, admitted with presyncope, AKI due to dehydration, which improved with IV fluid.  Currently back to his baseline.  Stable to discharge.  Cardiology cleared. Discharge to home with home health services. As per RN patient's daughter can pick him tomorrow around 2 PM TOC left for the day, cannot arrange transportation at this time.  Subjective: No significant events overnight.  Patient denies any dizziness, no complaints, stable to go home.   Physical Exam: General: NAD, lying comfortably Appear in no distress, affect appropriate Eyes: PERRLA ENT: Oral Mucosa Clear, moist  Neck: no JVD,  Cardiovascular: S1 and S2 Present, no Murmur,  Respiratory: good respiratory effort, Bilateral Air entry equal and Decreased, no Crackles, no wheezes Abdomen: Bowel Sound present, Soft and no tenderness,  Skin: no rashes Extremities: no Pedal edema, no calf tenderness Neurologic: without any new focal findings Gait not checked due to patient safety concerns  Vitals:   12/10/24 2037 12/11/24 0204 12/11/24 0826 12/11/24 0830  BP: 104/65 120/63 122/62 132/72  Pulse: 63 65 60 82  Resp:  17 17 17   Temp:  97.9 F (36.6 C) 98.1 F (36.7 C) 98 F (36.7 C)  TempSrc:      SpO2: 100% 98% 100% 96%  Weight:      Height:        Intake/Output Summary (Last 24 hours) at 12/11/2024 1647 Last data filed at 12/11/2024 1450 Gross per 24 hour  Intake 1396.66 ml  Output 1050 ml  Net 346.66 ml   Filed Weights   12/06/24 2049 12/08/24 0500 12/09/24 0500  Weight: 93.4 kg 76.1 kg 77.4 kg    Data Reviewed: I have  personally reviewed and interpreted daily labs, tele strips, imagings as discussed above. I reviewed all nursing notes, pharmacy notes, vitals, pertinent old records I have discussed plan of care as described above with RN and patient/family.  CBC: Recent Labs  Lab 12/06/24 2056 12/07/24 0449  WBC 6.7 6.5  HGB 14.2 13.5  HCT 45.8 42.3  MCV 87.2 86.3  PLT 168 151   Basic Metabolic Panel: Recent Labs  Lab 12/07/24 0449 12/08/24 0817 12/09/24 1026 12/10/24 0438 12/11/24 0456  NA 132* 137 137 135 138  K 3.5 3.8 4.2 4.0 3.7  CL 95* 100 101 99 107  CO2 26 25 26 26 23   GLUCOSE 188* 129* 156* 217* 199*  BUN 90* 84* 73* 70* 65*  CREATININE 4.29* 3.80* 2.98* 2.72* 2.20*  CALCIUM  8.0* 8.2* 8.2* 8.0* 7.0*  MG 1.8  --  1.6* 2.1  --   PHOS 3.2  --  2.4* 2.8  --     Studies: No results found.   Scheduled Meds:  amiodarone   200 mg Oral BID   apixaban   2.5 mg Oral BID   carvedilol   3.125 mg Oral BID WC   diclofenac  Sodium  2 g Topical BID   insulin  aspart  0-15 Units Subcutaneous TID WC  magnesium  oxide  400 mg Oral Daily   rosuvastatin   20 mg Oral Daily   tamsulosin   0.4 mg Oral Daily   [START ON 12/12/2024] torsemide   40 mg Oral Daily   Vitamin D  (Ergocalciferol )  50,000 Units Oral Q7 days   Continuous Infusions:   PRN Meds: acetaminophen  **OR** acetaminophen , albuterol , HYDROcodone -acetaminophen , nitroGLYCERIN , ondansetron  **OR** ondansetron  (ZOFRAN ) IV, mouth rinse  Time spent: 55 minutes  Author: ELVAN SOR. MD [facility] 12/11/2024 4:47 PM  To reach On-call, see care teams to locate the attending and reach out to them via [email or web address]. If 7PM-7AM, please contact night-coverage If you still have difficulty reaching the attending provider, please page the [facility] (Director on Call) for [facility] on amion for assistance.

## 2024-12-11 NOTE — Progress Notes (Signed)
 " [facility]  ROUNDING NOTE   Subjective:   Patient sitting up in bed Awake and alert Denies pain Room air   Objective:  Vital signs in last 24 hours:  Temp:  [97.8 F (36.6 C)-98.1 F (36.7 C)] 98 F (36.7 C) (02/03 0830) Pulse Rate:  [60-82] 82 (02/03 0830) Resp:  [17-18] 17 (02/03 0830) BP: (97-132)/(55-72) 132/72 (02/03 0830) SpO2:  [96 %-100 %] 96 % (02/03 0830)  Weight change:  Filed Weights   12/06/24 2049 12/08/24 0500 12/09/24 0500  Weight: 93.4 kg 76.1 kg 77.4 kg    Intake/Output: I/O last 3 completed shifts: In: 2241.6 [P.O.:720; I.V.:1521.6] Out: 2575 [Urine:2575]   Intake/Output this shift:  No intake/output data recorded.  Physical Exam: General: NAD  Head: Normocephalic, atraumatic. Moist oral mucosal membranes  Eyes: Anicteric  Lungs:  Clear to auscultation  Heart: Regular rate and rhythm  Abdomen:  Soft, nontender  Extremities:  No peripheral edema.  Neurologic: Awake, alert, conversant  Skin: Warm,dry, no rash  Access: None    Basic Metabolic Panel: Recent Labs  Lab 12/07/24 0449 12/08/24 0817 12/09/24 1026 12/10/24 0438 12/11/24 0456  NA 132* 137 137 135 138  K 3.5 3.8 4.2 4.0 3.7  CL 95* 100 101 99 107  CO2 26 25 26 26 23   GLUCOSE 188* 129* 156* 217* 199*  BUN 90* 84* 73* 70* 65*  CREATININE 4.29* 3.80* 2.98* 2.72* 2.20*  CALCIUM  8.0* 8.2* 8.2* 8.0* 7.0*  MG 1.8  --  1.6* 2.1  --   PHOS 3.2  --  2.4* 2.8  --     Liver Function Tests: No results for input(s): AST, ALT, ALKPHOS, BILITOT, PROT, ALBUMIN  in the last 168 hours. No results for input(s): LIPASE, AMYLASE in the last 168 hours. No results for input(s): AMMONIA in the last 168 hours.  CBC: Recent Labs  Lab 12/06/24 2056 12/07/24 0449  WBC 6.7 6.5  HGB 14.2 13.5  HCT 45.8 42.3  MCV 87.2 86.3  PLT 168 151    Cardiac Enzymes: No results for input(s): CKTOTAL, CKMB, CKMBINDEX, TROPONINI in the last 168  hours.  BNP: Invalid input(s): POCBNP  CBG: Recent Labs  Lab 12/10/24 1153 12/10/24 1620 12/10/24 1956 12/11/24 0825 12/11/24 1153  GLUCAP 201* 174* 195* 176* 277*    Microbiology: Results for orders placed or performed during the hospital encounter of 12/06/24  Resp panel by RT-PCR (RSV, Flu A&B, Covid) Anterior Nasal Swab     Status: None   Collection Time: 12/06/24 10:33 PM   Specimen: Anterior Nasal Swab  Result Value Ref Range Status   SARS Coronavirus 2 by RT PCR NEGATIVE NEGATIVE Final    Comment: (NOTE) SARS-CoV-2 target nucleic acids are NOT DETECTED.  The SARS-CoV-2 RNA is generally detectable in upper respiratory specimens during the acute phase of infection. The lowest concentration of SARS-CoV-2 viral copies this assay can detect is 138 copies/mL. A negative result does not preclude SARS-Cov-2 infection and should not be used as the sole basis for treatment or other patient management decisions. A negative result may occur with  improper specimen collection/handling, submission of specimen other than nasopharyngeal swab, presence of viral mutation(s) within the areas targeted by this assay, and inadequate number of viral copies(<138 copies/mL). A negative result must be combined with clinical observations, patient history, and epidemiological information. The expected result is Negative.  Fact Sheet for Patients:  bloggercourse.com  Fact Sheet for Healthcare Providers:  seriousbroker.it  This test is no t  yet approved or cleared by the [place]  FDA and  has been authorized for detection and/or diagnosis of SARS-CoV-2 by FDA under an Emergency Use Authorization (EUA). This EUA will remain  in effect (meaning this test can be used) for the duration of the COVID-19 declaration under Section 564(b)(1) of the Act, 21 U.S.C.section 360bbb-3(b)(1), unless the authorization is terminated  or revoked  sooner.       Influenza A by PCR NEGATIVE NEGATIVE Final   Influenza B by PCR NEGATIVE NEGATIVE Final    Comment: (NOTE) The Xpert Xpress SARS-CoV-2/FLU/RSV plus assay is intended as an aid in the diagnosis of influenza from Nasopharyngeal swab specimens and should not be used as a sole basis for treatment. Nasal washings and aspirates are unacceptable for Xpert Xpress SARS-CoV-2/FLU/RSV testing.  Fact Sheet for Patients: bloggercourse.com  Fact Sheet for Healthcare Providers: seriousbroker.it  This test is not yet approved or cleared by the [place]  FDA and has been authorized for detection and/or diagnosis of SARS-CoV-2 by FDA under an Emergency Use Authorization (EUA). This EUA will remain in effect (meaning this test can be used) for the duration of the COVID-19 declaration under Section 564(b)(1) of the Act, 21 U.S.C. section 360bbb-3(b)(1), unless the authorization is terminated or revoked.     Resp Syncytial Virus by PCR NEGATIVE NEGATIVE Final    Comment: (NOTE) Fact Sheet for Patients: bloggercourse.com  Fact Sheet for Healthcare Providers: seriousbroker.it  This test is not yet approved or cleared by the [place]  FDA and has been authorized for detection and/or diagnosis of SARS-CoV-2 by FDA under an Emergency Use Authorization (EUA). This EUA will remain in effect (meaning this test can be used) for the duration of the COVID-19 declaration under Section 564(b)(1) of the Act, 21 U.S.C. section 360bbb-3(b)(1), unless the authorization is terminated or revoked.  Performed at [facility], [address] [place], [place] [address]     Coagulation Studies: No results for input(s): LABPROT, INR in the last 72 hours.  Urinalysis: No results for input(s): COLORURINE, LABSPEC, PHURINE, GLUCOSEU, HGBUR, BILIRUBINUR, KETONESUR,  PROTEINUR, UROBILINOGEN, NITRITE, LEUKOCYTESUR in the last 72 hours.  Invalid input(s): APPERANCEUR    Imaging: No results found.    Medications:      amiodarone   200 mg Oral BID   apixaban   2.5 mg Oral BID   diclofenac  Sodium  2 g Topical BID   insulin  aspart  0-15 Units Subcutaneous TID WC   magnesium  oxide  400 mg Oral Daily   rosuvastatin   20 mg Oral Daily   tamsulosin   0.4 mg Oral Daily   Vitamin D  (Ergocalciferol )  50,000 Units Oral Q7 days   acetaminophen  **OR** acetaminophen , albuterol , HYDROcodone -acetaminophen , nitroGLYCERIN , ondansetron  **OR** ondansetron  (ZOFRAN ) IV, mouth rinse  Assessment/ Plan:  Mr. [name] is a 89 y.o.  male with past medical history of CAD s/p CABG, GIB, PAF on eliquis , CHF, and chronic kidney disease stage IV., who was admitted to [facility] on 12/06/2024 for AKI (acute kidney injury) [N17.9] Postural dizziness with presyncope [R42, R55]   Acute kidney injury on chronic kidney disease stage IV. Baseline creatinine appears to be 2.62 with GFR 23 on 11/04/24, during recent hospitalization at [facility]. Renal US  negative for obstruction. Workup in progress, etiology unknown. Likely aggressive diuresis in outpatient setting Creatinine at baseline. Continue to encourage oral intake and hydration. No acute need for dialysis. Would encourage Cardiology assistance to determine diuretics.    Lab Results  Component Value Date   CREATININE 2.20 (  H) 12/11/2024   CREATININE 2.72 (H) 12/10/2024   CREATININE 2.98 (H) 12/09/2024    Intake/Output Summary (Last 24 hours) at 12/11/2024 1258 Last data filed at 12/10/2024 2200 Gross per 24 hour  Intake 1156.66 ml  Output 1050 ml  Net 106.66 ml   2. Chronic systolic heart failure, ECHO 08/17/24 shows EF 25-30% with large pleural effusion, TVR mild or moderate, mild aortic valve calcification.   3. Anemia of chronic kidney disease  Lab Results  Component Value Date   HGB 13.5 12/07/2024    Hgb  at  goal  4. Diabetes mellitus type II with chronic kidney disease/renal manifestations: insulin  dependent. Home regimen includes lantus . Most recent hemoglobin A1c is 7.3 on 07/07/24.   SSI managed by primary team   5. Hypertension with chronic kidney disease. Home regimen Metolazone , entresto , spironolactone , torsemide , and carvedilol . All held  Blood pressure stable   LOS: 4 Neta Upadhyay 2/3/202612:58 PM   "

## 2024-12-11 NOTE — Discharge Instructions (Signed)
 Low Sodium Nutrition Therapy  Eating less sodium can help you if you have high blood pressure, heart failure, or kidney or liver disease.   Your body needs a little sodium, but too much sodium can cause your body to hold onto extra water. This extra water will raise your blood pressure and can cause damage to your heart, kidneys, or liver as they are forced to work harder.   Sometimes you can see how the extra fluid affects you because your hands, legs, or belly swell. You may also hold water around your heart and lungs, which makes it hard to breathe.   Even if you take medication for blood pressure or a water pill (diuretic) to remove fluid, it is still important to have less salt in your diet.   Check with your primary care provider before drinking alcohol since it may affect the amount of fluid in your body and how your heart, kidneys, or liver work. Sodium in Food A low-sodium meal plan limits the sodium that you get from food and beverages to 1,500-2,000 milligrams (mg) per day. Salt is the main source of sodium. Read the nutrition label on the package to find out how much sodium is in one serving of a food.  . Select foods with 140 milligrams (mg) of sodium or less per serving.  . You may be able to eat one or two servings of foods with a little more than 140 milligrams (mg) of sodium if you are closely watching how much sodium you eat in a day.  . Check the serving size on the label. The amount of sodium listed on the label shows the amount in one serving of the food. So, if you eat more than one serving, you will get more sodium than the amount listed.  Tips Cutting Back on Sodium . Eat more fresh foods.  . Fresh fruits and vegetables are low in sodium, as well as frozen vegetables and fruits that have no added juices or sauces.  . Fresh meats are lower in sodium than processed meats, such as bacon, sausage, and hotdogs.  . Not all processed foods are unhealthy, but some processed foods  may have too much sodium.  . Eat less salt at the table and when cooking. One of the ingredients in salt is sodium.  . One teaspoon of table salt has 2,300 milligrams of sodium.  . Leave the salt out of recipes for pasta, casseroles, and soups. . Be a smart shopper.  . Food packages that say "Salt-free", sodium-free", "very low sodium," and "low sodium" have less than 140 milligrams of sodium per serving.  . Beware of products identified as "Unsalted," "No Salt Added," "Reduced Sodium," or "Lower Sodium." These items may still be high in sodium. You should always check the nutrition label. . Add flavors to your food without adding sodium.  . Try lemon juice, lime juice, or vinegar.  . Dry or fresh herbs add flavor.  Arnoldo Morale a sodium-free seasoning blend or make your own at home. . You can purchase salt-free or sodium-free condiments like barbeque sauce in stores and online. Ask your registered dietitian nutritionist for recommendations and where to find them.  .  Eating in Restaurants . Choose foods carefully when you eat outside your home. Restaurant foods can be very high in sodium. Many restaurants provide nutrition facts on their menus or their websites. If you cannot find that information, ask your server. Let your server know that you want your food  to be cooked without salt and that you would like your salad dressing and sauces to be served on the side.  .   . Foods Recommended . Food Group . Foods Recommended  . Grains . Bread, bagels, rolls without salted tops Homemade bread made with reduced-sodium baking powder Cold cereals, especially shredded wheat and puffed rice Oats, grits, or cream of wheat Pastas, quinoa, and rice Popcorn, pretzels or crackers without salt Corn tortillas  . Protein Foods . Fresh meats and fish; [place] bacon (check the nutrition labels - make sure they are not packaged in a sodium solution) Canned or packed tuna (no more than 4 ounces at 1 serving) Beans  and peas Soybeans) and tofu Eggs Nuts or nut butters without salt  . Dairy . Milk or milk powder Plant milks, such as rice and soy Yogurt, including Greek yogurt Small amounts of natural cheese (blocks of cheese) or reduced-sodium cheese can be used in moderation. (Swiss, ricotta, and fresh mozzarella cheese are lower in sodium than the others) Cream Cheese Low sodium cottage cheese  . Vegetables . Fresh and frozen vegetables without added sauces or salt Homemade soups (without salt) Low-sodium, salt-free or sodium-free canned vegetables and soups  . Fruit . Fresh and canned fruits Dried fruits, such as raisins, cranberries, and prunes  . Oils . Tub or liquid margarine, regular or without salt [place], corn, peanut, olive, safflower, or sunflower oils  . Condiments . Fresh or dried herbs such as basil, bay leaf, dill, mustard (dry), nutmeg, paprika, parsley, rosemary, sage, or thyme.  Low sodium ketchup Vinegar  Lemon or lime juice Pepper, red pepper flakes, and cayenne. Hot sauce contains sodium, but if you use just a drop or two, it will not add up to much.  Salt-free or sodium-free seasoning mixes and marinades Simple salad dressings: vinegar and oil  .  Marland Kitchen Foods Not Recommended . Food Group . Foods Not Recommended  . Grains . Breads or crackers topped with salt Cereals (hot/cold) with more than 300 mg sodium per serving Biscuits, cornbread, and other "quick" breads prepared with baking soda Pre-packaged bread crumbs Seasoned and packaged rice and pasta mixes Self-rising flours  . Protein Foods . Cured meats: Bacon, ham, sausage, pepperoni and hot dogs Canned meats (chili, vienna sausage, or sardines) Smoked fish and meats Frozen meals that have more than 600 mg of sodium per serving Egg substitute (with added sodium)  . Dairy . Buttermilk Processed cheese spreads Cottage cheese (1 cup may have over 500 mg of sodium; look for low-sodium.) American or feta cheese Shredded  Cheese has more sodium than blocks of cheese String cheese  . Vegetables . Canned vegetables (unless they are salt-free, sodium-free or low sodium) Frozen vegetables with seasoning and sauces Sauerkraut and pickled vegetables Canned or dried soups (unless they are salt-free, sodium-free, or low sodium) [place] fries and onion rings  . Fruit . Dried fruits preserved with additives that have sodium  . Oils . Salted butter or margarine, all types of olives  . Condiments . Salt, sea salt, kosher salt, onion salt, and garlic salt Seasoning mixes with salt Bouillon cubes Ketchup Barbeque sauce and Worcestershire sauce unless low sodium Soy sauce Salsa, pickles, olives, relish Salad dressings: ranch, blue cheese, [place], and [place].  .  . Low Sodium Sample 1-Day Menu  . Breakfast . 1 cup cooked oatmeal  . 1 slice whole wheat bread toast  . 1 tablespoon peanut butter without salt  . 1 banana  .  1 cup 1% milk  . Lunch . Tacos made with: 2 corn tortillas  .  cup black beans, low sodium  .  cup roasted or grilled chicken (without skin)  .  avocado  . Squeeze of lime juice  . 1 cup salad greens  . 1 tablespoon low-sodium salad dressing  .  cup strawberries  . 1 orange  . Afternoon Snack . 1/3 cup grapes  . 6 ounces yogurt  . Evening Meal . 3 ounces herb-baked fish  . 1 baked potato  . 2 teaspoons olive oil  .  cup cooked carrots  . 2 thick slices tomatoes on:  . 2 lettuce leaves  . 1 teaspoon olive oil  . 1 teaspoon balsamic vinegar  . 1 cup 1% milk  . Evening Snack . 1 apple  .  cup almonds without salt  .  Marland Kitchen Low-Sodium Vegetarian (Lacto-Ovo) Sample 1-Day Menu  . Breakfast . 1 cup cooked oatmeal  . 1 slice whole wheat toast  . 1 tablespoon peanut butter without salt  . 1 banana  . 1 cup 1% milk  . Lunch . Tacos made with: 2 corn tortillas  .  cup black beans, low sodium  .  cup roasted or grilled chicken (without skin)  .  avocado  . Squeeze of lime juice  . 1  cup salad greens  . 1 tablespoon low-sodium salad dressing  .  cup strawberries  . 1 orange  . Evening Meal . Stir fry made with:  cup tofu  . 1 cup brown rice  .  cup broccoli  .  cup green beans  .  cup peppers  .  tablespoon peanut oil  . 1 orange  . 1 cup 1% milk  . Evening Snack . 4 strips celery  . 2 tablespoons hummus  . 1 hard-boiled egg  .  Marland Kitchen Low-Sodium Vegan Sample 1-Day Menu  . Breakfast . 1 cup cooked oatmeal  . 1 tablespoon peanut butter without salt  . 1 cup blueberries  . 1 cup soymilk fortified with calcium, vitamin B12, and vitamin D  . Lunch . 1 small whole wheat pita  .  cup cooked lentils  . 2 tablespoons hummus  . 4 carrot sticks  . 1 medium apple  . 1 cup soymilk fortified with calcium, vitamin B12, and vitamin D  . Evening Meal . Stir fry made with:  cup tofu  . 1 cup brown rice  .  cup broccoli  .  cup green beans  .  cup peppers  .  tablespoon peanut oil  . 1 cup cantaloupe  . Evening Snack . 1 cup soy yogurt  .  cup mixed nuts  . Copyright 2020  Academy of [employer] and Dietetics. All rights reserved .  Marland Kitchen Sodium Free Flavoring Tips .  Marland Kitchen When cooking, the following items may be used for flavoring instead of salt or seasonings that contain sodium. . Remember: A little bit of spice goes a long way! Be careful not to overseason. Marland Kitchen Spice Blend Recipe (makes about ? cup) . 5 teaspoons onion powder  . 2 teaspoons garlic powder  . 2 teaspoons paprika  . 2 teaspoon dry mustard  . 1 teaspoon crushed thyme leaves  .  teaspoon white pepper  .  teaspoon celery seed Food Item Flavorings  Beef Basil, bay leaf, caraway, curry, dill, dry mustard, garlic, grape jelly, green pepper, mace, marjoram, mushrooms (fresh), nutmeg, onion or onion powder, parsley, pepper,  rosemary, sage  Chicken Basil, cloves, cranberries, mace, mushrooms (fresh), nutmeg, oregano, paprika, parsley, pineapple, saffron, sage, savory, tarragon, thyme, tomato,  turmeric  Egg Chervil, curry, dill, dry mustard, garlic or garlic powder, green pepper, jelly, mushrooms (fresh), nutmeg, onion powder, paprika, parsley, rosemary, tarragon, tomato  Fish Basil, bay leaf, chervil, curry, dill, dry mustard, green pepper, lemon juice, marjoram, mushrooms (fresh), paprika, pepper, tarragon, tomato, turmeric  Lamb Cloves, curry, dill, garlic or garlic powder, mace, mint, mint jelly, onion, oregano, parsley, pineapple, rosemary, tarragon, thyme  Pork Applesauce, basil, caraway, chives, cloves, garlic or garlic powder, onion or onion powder, rosemary, thyme  Veal Apricots, basil, bay leaf, currant jelly, curry, ginger, marjoram, mushrooms (fresh), oregano, paprika  Vegetables Basil, dill, garlic or garlic powder, ginger, lemon juice, mace, marjoram, nutmeg, onion or onion powder, tarragon, tomato, sugar or sugar substitute, salt-free salad dressing, vinegar  Desserts Allspice, anise, cinnamon, cloves, ginger, mace, nutmeg, vanilla extract, other extracts   Copyright 2020  Academy of Nutrition and Dietetics. All rights reserved

## 2024-12-12 LAB — GLUCOSE, CAPILLARY
Glucose-Capillary: 113 mg/dL — ABNORMAL HIGH (ref 70–99)
Glucose-Capillary: 198 mg/dL — ABNORMAL HIGH (ref 70–99)

## 2024-12-12 NOTE — Plan of Care (Signed)

## 2024-12-12 NOTE — Progress Notes (Addendum)
 " [facility]  ROUNDING NOTE   Subjective:   Patient resting in bed Alert and oriented Room air No lower extremity edema   Objective:  Vital signs in last 24 hours:  Temp:  [97.7 F (36.5 C)-98.6 F (37 C)] 97.7 F (36.5 C) (02/04 0835) Pulse Rate:  [60-69] 69 (02/04 0835) Resp:  [17-18] 17 (02/04 0835) BP: (108-133)/(57-73) 133/73 (02/04 0835) SpO2:  [99 %-100 %] 100 % (02/04 0835) Weight:  [82.6 kg] 82.6 kg (02/04 0500)  Weight change:  Filed Weights   12/08/24 0500 12/09/24 0500 12/12/24 0500  Weight: 76.1 kg 77.4 kg 82.6 kg    Intake/Output: I/O last 3 completed shifts: In: 795.8 [P.O.:600; I.V.:195.8] Out: 1400 [Urine:1400]   Intake/Output this shift:  Total I/O In: 600 [P.O.:600] Out: -   Physical Exam: General: NAD  Head: Normocephalic, atraumatic. Moist oral mucosal membranes  Eyes: Anicteric  Lungs:  Clear to auscultation  Heart: Regular rate and rhythm  Abdomen:  Soft, nontender  Extremities:  No peripheral edema.  Neurologic: Awake, alert, conversant  Skin: Warm,dry, no rash  Access: None    Basic Metabolic Panel: Recent Labs  Lab 12/07/24 0449 12/08/24 0817 12/09/24 1026 12/10/24 0438 12/11/24 0456  NA 132* 137 137 135 138  K 3.5 3.8 4.2 4.0 3.7  CL 95* 100 101 99 107  CO2 26 25 26 26 23   GLUCOSE 188* 129* 156* 217* 199*  BUN 90* 84* 73* 70* 65*  CREATININE 4.29* 3.80* 2.98* 2.72* 2.20*  CALCIUM  8.0* 8.2* 8.2* 8.0* 7.0*  MG 1.8  --  1.6* 2.1  --   PHOS 3.2  --  2.4* 2.8  --     Liver Function Tests: No results for input(s): AST, ALT, ALKPHOS, BILITOT, PROT, ALBUMIN  in the last 168 hours. No results for input(s): LIPASE, AMYLASE in the last 168 hours. No results for input(s): AMMONIA in the last 168 hours.  CBC: Recent Labs  Lab 12/06/24 2056 12/07/24 0449  WBC 6.7 6.5  HGB 14.2 13.5  HCT 45.8 42.3  MCV 87.2 86.3  PLT 168 151    Cardiac Enzymes: No results for input(s): CKTOTAL,  CKMB, CKMBINDEX, TROPONINI in the last 168 hours.  BNP: Invalid input(s): POCBNP  CBG: Recent Labs  Lab 12/11/24 0825 12/11/24 1153 12/11/24 1708 12/12/24 0745 12/12/24 1225  GLUCAP 176* 277* 119* 113* 198*    Microbiology: Results for orders placed or performed during the hospital encounter of 12/06/24  Resp panel by RT-PCR (RSV, Flu A&B, Covid) Anterior Nasal Swab     Status: None   Collection Time: 12/06/24 10:33 PM   Specimen: Anterior Nasal Swab  Result Value Ref Range Status   SARS Coronavirus 2 by RT PCR NEGATIVE NEGATIVE Final    Comment: (NOTE) SARS-CoV-2 target nucleic acids are NOT DETECTED.  The SARS-CoV-2 RNA is generally detectable in upper respiratory specimens during the acute phase of infection. The lowest concentration of SARS-CoV-2 viral copies this assay can detect is 138 copies/mL. A negative result does not preclude SARS-Cov-2 infection and should not be used as the sole basis for treatment or other patient management decisions. A negative result may occur with  improper specimen collection/handling, submission of specimen other than nasopharyngeal swab, presence of viral mutation(s) within the areas targeted by this assay, and inadequate number of viral copies(<138 copies/mL). A negative result must be combined with clinical observations, patient history, and epidemiological information. The expected result is Negative.  Fact Sheet for Patients:  bloggercourse.com  Fact Sheet for Healthcare Providers:  seriousbroker.it  This test is no t yet approved or cleared by the [place]  FDA and  has been authorized for detection and/or diagnosis of SARS-CoV-2 by FDA under an Emergency Use Authorization (EUA). This EUA will remain  in effect (meaning this test can be used) for the duration of the COVID-19 declaration under Section 564(b)(1) of the Act, 21 U.S.C.section 360bbb-3(b)(1), unless  the authorization is terminated  or revoked sooner.       Influenza A by PCR NEGATIVE NEGATIVE Final   Influenza B by PCR NEGATIVE NEGATIVE Final    Comment: (NOTE) The Xpert Xpress SARS-CoV-2/FLU/RSV plus assay is intended as an aid in the diagnosis of influenza from Nasopharyngeal swab specimens and should not be used as a sole basis for treatment. Nasal washings and aspirates are unacceptable for Xpert Xpress SARS-CoV-2/FLU/RSV testing.  Fact Sheet for Patients: bloggercourse.com  Fact Sheet for Healthcare Providers: seriousbroker.it  This test is not yet approved or cleared by the [place]  FDA and has been authorized for detection and/or diagnosis of SARS-CoV-2 by FDA under an Emergency Use Authorization (EUA). This EUA will remain in effect (meaning this test can be used) for the duration of the COVID-19 declaration under Section 564(b)(1) of the Act, 21 U.S.C. section 360bbb-3(b)(1), unless the authorization is terminated or revoked.     Resp Syncytial Virus by PCR NEGATIVE NEGATIVE Final    Comment: (NOTE) Fact Sheet for Patients: bloggercourse.com  Fact Sheet for Healthcare Providers: seriousbroker.it  This test is not yet approved or cleared by the [place]  FDA and has been authorized for detection and/or diagnosis of SARS-CoV-2 by FDA under an Emergency Use Authorization (EUA). This EUA will remain in effect (meaning this test can be used) for the duration of the COVID-19 declaration under Section 564(b)(1) of the Act, 21 U.S.C. section 360bbb-3(b)(1), unless the authorization is terminated or revoked.  Performed at [facility], [address] [place], [place] [address]     Coagulation Studies: No results for input(s): LABPROT, INR in the last 72 hours.  Urinalysis: No results for input(s): COLORURINE, LABSPEC, PHURINE,  GLUCOSEU, HGBUR, BILIRUBINUR, KETONESUR, PROTEINUR, UROBILINOGEN, NITRITE, LEUKOCYTESUR in the last 72 hours.  Invalid input(s): APPERANCEUR    Imaging: No results found.    Medications:      amiodarone   200 mg Oral BID   apixaban   2.5 mg Oral BID   carvedilol   3.125 mg Oral BID WC   diclofenac  Sodium  2 g Topical BID   insulin  aspart  0-15 Units Subcutaneous TID WC   magnesium  oxide  400 mg Oral Daily   rosuvastatin   20 mg Oral Daily   tamsulosin   0.4 mg Oral Daily   torsemide   40 mg Oral Daily   Vitamin D  (Ergocalciferol )  50,000 Units Oral Q7 days   acetaminophen  **OR** acetaminophen , albuterol , HYDROcodone -acetaminophen , nitroGLYCERIN , ondansetron  **OR** ondansetron  (ZOFRAN ) IV, mouth rinse  Assessment/ Plan:  Mr. [name] is a 89 y.o.  male with past medical history of CAD s/p CABG, GIB, PAF on eliquis , CHF, and chronic kidney disease stage IV., who was admitted to [facility] on 12/06/2024 for AKI (acute kidney injury) [N17.9] Postural dizziness with presyncope [R42, R55]   Acute kidney injury on chronic kidney disease stage IV. Baseline creatinine appears to be 2.62 with GFR 23 on 11/04/24, during recent hospitalization at [facility]. Renal US  negative for obstruction. Workup in progress, etiology unknown. Likely aggressive diuresis in outpatient setting Remains at baseline  Lab Results  Component Value Date   CREATININE 2.20 (H) 12/11/2024   CREATININE 2.72 (H) 12/10/2024   CREATININE 2.98 (H) 12/09/2024    Intake/Output Summary (Last 24 hours) at 12/12/2024 1423 Last data filed at 12/12/2024 1300 Gross per 24 hour  Intake 1080 ml  Output 900 ml  Net 180 ml   2. Chronic systolic heart failure, ECHO 08/17/24 shows EF 25-30% with large pleural effusion, TVR mild or moderate, mild aortic valve calcification.   3. Anemia of chronic kidney disease  Lab Results  Component Value Date   HGB 13.5 12/07/2024    Hgb acceptable for this patient  4. Diabetes  mellitus type II with chronic kidney disease/renal manifestations: insulin  dependent. Home regimen includes lantus . Most recent hemoglobin A1c is 7.3 on 07/07/24.   SSI managed by primary team. Glucose elevated at times   5. Hypertension with chronic kidney disease. Home regimen Metolazone , entresto , spironolactone , torsemide , and carvedilol . All held  Blood pressure stable  Due to renal recovery, we will sign off at this time.    LOS: 5 Tyja Gortney 2/4/20262:23 PM   "

## 2024-12-12 NOTE — Discharge Summary (Signed)
 " Physician Discharge Summary   Patient: [name] MRN: [id number] DOB: 11-Jan-1935  Admit date:     12/06/2024  Discharge date: 12/12/24  Discharge Physician: Amaryllis Dare   PCP: [name], MD   Recommendations at discharge:  Please obtain CBC and BMP and follow-up Follow-up with primary care provider Follow-up with cardiology  Discharge Diagnoses: Principal Problem:   Postural dizziness with presyncope Active Problems:   Chronic systolic CHF (congestive heart failure) (HCC)   Acute renal failure superimposed on stage 3b chronic kidney disease (HCC)   CAD, s/p CABG x 5   Type 2 diabetes mellitus with hyperlipidemia (HCC)   HTN (hypertension)   Paroxysmal atrial fibrillation (HCC)   History of cardiac arrest   Complete heart block s/p pacemaker (HCC)   Dilated cardiomyopathy (HCC)   Ischemic cardiomyopathy   History of prostate cancer   History of GI bleed   AKI (acute kidney injury)   Hospital Course: [name] is a 89 y.o. male with medical history significant for PAF on Eliquis , CAD s/p CABG x5 and multiple stents, HFrEF secondary to ischemic cardiomyopathy (EF 25-30% 12/2023), heart block s/p pacemaker, CKD stage IV, prior GI bleed, multiple admissions for CHF exacerbation, most recently 11/16/2024  -11/23/2024 now being admitted with presyncope, and AKI on CKD suspect secondary to overdiuresis. He presented with weakness and lightheadedness on awaking earlier today.  He denied chest pain, palpitations, lower extremity edema. In the ED, BP 102/57 with otherwise normal vitals. Labs notable for troponin 156 (recently 110-124), creatinine 4.44 up from 2.38 a couple weeks prior, with anion gap of 17 and bicarb of 19.  Other labs for the most part at baseline. EKG showed paced rhythm at 66 Chest x-ray clear CT head nonacute with chronic microvascular changes.  His orthostatic vitals improved with IV fluid.  Home Entresto  and metolazone  was held.  That can be restarted  as outpatient by cardiology when appropriate.  He will continue with the rest of his home medications.  Patient was also found to have AKI superimposed on CKD stage IV associated with high anion gap metabolic acidosis.  Likely secondary to overdiuresis and dehydration.  Renal ultrasound with no hydronephrosis, multiple renal cortical cysts.  Patient received gentle IV fluid and his renal function improved back to baseline.  We held home Entresto , spironolactone  and metolazone .  He will continue with home torsemide  and need to have a close follow-up with his providers and they can restart medications when appropriate.  Patient also has mildly elevated troponin which seems chronic and no chest pain.  Patient will continue with her home Coreg , amiodarone  and Eliquis  for paroxysmal atrial fibrillation.  Patient was also found to have vitamin D  deficiency and started on high-dose vitamin D  supplement.  Need to follow-up with PCP and they can repeat levels as outpatient.   However physical therapist did recommended SNF but patient refused and is being discharged home with home health services.  He will continue on current medications and follow-up with his providers for further assistance.     Consultants: Cardiology.  Nephrology Procedures performed: None Disposition: Home health Diet recommendation:  Cardiac diet DISCHARGE MEDICATION: Allergies as of 12/12/2024       Reactions   Angiotensin Receptor Blockers    hyperkalemia   Metformin Diarrhea        Medication List     PAUSE taking these medications    metolazone  2.5 MG tablet Wait to take this until your doctor or other care provider  tells you to start again. Commonly known as: ZAROXOLYN  Take 2.5 mg by mouth. 2 times a week   sacubitril -valsartan  24-26 MG Wait to take this until your doctor or other care provider tells you to start again. Commonly known as: ENTRESTO  Take 1 tablet by mouth 2 (two) times daily.    spironolactone  25 MG tablet Wait to take this until your doctor or other care provider tells you to start again. Commonly known as: ALDACTONE  Take 1 tablet (25 mg total) by mouth daily.       TAKE these medications    acetaminophen  325 MG tablet Commonly known as: TYLENOL  Take 2 tablets (650 mg total) by mouth every 6 (six) hours as needed for mild pain (pain score 1-3), fever, headache or moderate pain (pain score 4-6). What changed:  medication strength how much to take reasons to take this   acyclovir  800 MG tablet Commonly known as: ZOVIRAX  Take 800 mg by mouth daily.   albuterol  108 (90 Base) MCG/ACT inhaler Commonly known as: VENTOLIN  HFA Inhale 2 puffs into the lungs every 6 (six) hours as needed for wheezing or shortness of breath.   amiodarone  200 MG tablet Commonly known as: PACERONE  Take 1 tablet (200 mg total) by mouth 2 (two) times daily.   carvedilol  3.125 MG tablet Commonly known as: COREG  Take 1 tablet (3.125 mg total) by mouth 2 (two) times daily with a meal. What changed:  medication strength how much to take   diclofenac  Sodium 1 % Gel Commonly known as: VOLTAREN  Apply 2 g topically 4 (four) times daily.   Eliquis  2.5 MG Tabs tablet Generic drug: apixaban  TAKE 1 TABLET BY MOUTH TWICE A DAY   ezetimibe  10 MG tablet Commonly known as: ZETIA  TAKE 1 TABLET BY MOUTH EVERY DAY   insulin  glargine 100 UNIT/ML injection Commonly known as: LANTUS  Inject 15 Units into the skin at bedtime.   magnesium  oxide 400 (240 Mg) MG tablet Commonly known as: MAG-OX Take 1 tablet by mouth daily.   nitroGLYCERIN  0.4 MG SL tablet Commonly known as: NITROSTAT  Place 0.4 mg under the tongue every 5 (five) minutes as needed for chest pain.   pantoprazole  40 MG tablet Commonly known as: PROTONIX  Take 1 tablet (40 mg total) by mouth 2 (two) times daily. What changed:  when to take this reasons to take this   polyethylene glycol 17 g packet Commonly known as:  MIRALAX  / GLYCOLAX  Take 17 g by mouth daily as needed for moderate constipation.   rosuvastatin  20 MG tablet Commonly known as: CRESTOR  Take 1 tablet (20 mg total) by mouth daily.   tamsulosin  0.4 MG Caps capsule Commonly known as: FLOMAX  Take 0.4 mg by mouth daily.   torsemide  20 MG tablet Commonly known as: DEMADEX  Take 2 tablets (40 mg total) by mouth daily. What changed: how much to take   Vitamin D  (Ergocalciferol ) 1.25 MG (50000 UNIT) Caps capsule Commonly known as: DRISDOL  Take 1 capsule (50,000 Units total) by mouth every 7 (seven) days. Start taking on: December 15, 2024               Discharge Care Instructions  (From admission, onward)           Start     Ordered   12/11/24 0000  Discharge wound care:       Comments: As per wound care   12/11/24 1458            Contact information for follow-up providers  [facility]. Go on 12/18/2024.   Specialty: Cardiology Why: Hospital follow-up 12/18/24 @ 2:30 PM Please bring all medications to follow-up appointment [facility], [address] Floor Free Valet Parking at the door Contact information: [address] [place]  [address] [phone]        [name], MD. Schedule an appointment as soon as possible for a visit in 1 week(s).   Specialty: Internal Medicine Contact information: [address] [place] [address] [phone]              Contact information for after-discharge care     Home Medical Care     [facility]) .   Service: Home Health Services Contact information: [address] [place]  [address] [phone]                    Discharge Exam: [name]   12/08/24 0500 12/09/24 0500 12/12/24 0500  Weight: 76.1 kg 77.4 kg 82.6 kg   General.  Frail elderly man, in no acute distress. Pulmonary.   Lungs clear bilaterally, normal respiratory effort. CV.  Regular rate and rhythm, no JVD, rub or murmur. Abdomen.  Soft, nontender, nondistended, BS positive. CNS.  Alert and oriented .  No focal neurologic deficit. Extremities.  No edema,  pulses intact and symmetrical.  Condition at discharge: stable  The results of significant diagnostics from this hospitalization (including imaging, microbiology, ancillary and laboratory) are listed below for reference.   Imaging Studies: US  RENAL Result Date: 12/07/2024 EXAM: RETROPERITONEAL ULTRASOUND OF THE KIDNEYS 12/07/2024 09:53:10 AM TECHNIQUE: Real-time ultrasonography of the retroperitoneum, specifically the kidneys and urinary bladder, was performed. COMPARISON: CT dated 10/15/2024. CLINICAL HISTORY: Acute kidney injury (AKI). FINDINGS: RIGHT KIDNEY: Right kidney measures 11.5 x 6.7 x 6.2 cm. Normal cortical echogenicity. No hydronephrosis. No calculus. Multiple right renal cortical cysts, largest 10.3 cm from the lower pole. LEFT KIDNEY: Left kidney measures 11.8 x 6.3 x 5.2 cm. Normal cortical echogenicity. No hydronephrosis. No calculus. Multiple left renal cortical cysts, largest 3.8 cm mid pole. BLADDER: Urinary bladder physiologically distended. LIVER: A 3.4 cm simple appearing right hepatic cyst is noted, consistent with findings on the prior CT. IMPRESSION: 1. No hydronephrosis. 2. Multiple   renal cortical cysts. Electronically signed by: Dayne Hassell MD 12/07/2024 10:14 AM EST RP Workstation: [id number]   CT Head Wo Contrast Result Date: 12/06/2024 EXAM: CT HEAD WITHOUT CONTRAST 12/06/2024 09:46:36 PM TECHNIQUE: CT of the head was performed without the administration of intravenous contrast. Automated exposure control, iterative reconstruction, and/or weight based adjustment of the mA/kV was utilized to reduce the radiation dose to as low as reasonably achievable. COMPARISON: 10/15/2024 CLINICAL HISTORY: Syncope/presyncope; cerebrovascular  cause suspected. FINDINGS: BRAIN AND VENTRICLES: No acute hemorrhage. No evidence of acute infarct. Chronic right basal ganglia infarct. Age-related cerebral volume loss and moderate periventricular white matter disease. No extra-axial collection. No mass effect or midline shift. ORBITS: No acute abnormality. Bilateral lens replacement. SINUSES: No acute abnormality. SOFT TISSUES AND SKULL: No acute soft tissue abnormality. No skull fracture. Moderate calcific atheromatous disease within carotid siphons. IMPRESSION: 1. Chronic ischemic changes without acute abnormality. Electronically signed by: Oneil Devonshire MD 12/06/2024 09:56 PM EST RP Workstation: [id number]   DG Chest 1 View Result Date: 12/06/2024 EXAM: 2 VIEW(S) XRAY OF THE CHEST 12/06/2024 09:17:00 PM COMPARISON: Chest radiograph 11/16/2024 and CT chest 10/15/2024. CLINICAL HISTORY:  Dizziness. FINDINGS: LUNGS AND PLEURA: Lungs are clear. No pleural effusion. No pneumothorax. HEART AND MEDIASTINUM: Postoperative changes in the mediastinum. Cardiac pacemaker. Heart size and pulmonary vascularity are normal. Mediastinal contours appear intact. BONES AND SOFT TISSUES: Heterogeneous calcification projecting over the right chest laterally corresponds to subscapular soft tissue calcification on prior CT chest. No change since prior study. No acute osseous abnormality. IMPRESSION: 1. No acute cardiopulmonary abnormality. Electronically signed by: Elsie Gravely MD 12/06/2024 09:21 PM EST RP Workstation: [id number]   DG Knee Complete 4 Views Right Result Date: 11/21/2024 CLINICAL DATA:  Right knee pain and swelling EXAM: RIGHT KNEE - COMPLETE 4+ VIEW COMPARISON:  None Available. FINDINGS: No evidence of fracture or dislocation. Moderate size suprapatellar joint effusion is noted. Severe narrowing of medial joint space is noted. Mild osteophyte formation is noted laterally. Mild narrowing and osteophyte formation of patellofemoral space is noted. Soft tissues are  unremarkable. IMPRESSION: Tricompartmental degenerative joint disease as described above. Moderate size suprapatellar joint effusion. No fracture or dislocation. Electronically Signed   By: Lynwood Landy Raddle M.D.   On: 11/21/2024 15:42   DG Chest 2 View Result Date: 11/16/2024 CLINICAL DATA:  Shortness of breath EXAM: CHEST - 2 VIEW COMPARISON:  October 15, 2024 FINDINGS: Stable cardiomegaly. Status post coronary artery bypass graft. Left-sided pacemaker is unchanged. Minimal left basilar atelectasis is noted with small left pleural effusion. Minimal right basilar subsegmental atelectasis. Bony thorax is unremarkable. IMPRESSION: Minimal bibasilar subsegmental atelectasis with small left pleural effusion. Electronically Signed   By: Lynwood Landy Raddle M.D.   On: 11/16/2024 16:13    Microbiology: Results for orders placed or performed during the hospital encounter of 12/06/24  Resp panel by RT-PCR (RSV, Flu A&B, Covid) Anterior Nasal Swab     Status: None   Collection Time: 12/06/24 10:33 PM   Specimen: Anterior Nasal Swab  Result Value Ref Range Status   SARS Coronavirus 2 by RT PCR NEGATIVE NEGATIVE Final    Comment: (NOTE) SARS-CoV-2 target nucleic acids are NOT DETECTED.  The SARS-CoV-2 RNA is generally detectable in upper respiratory specimens during the acute phase of infection. The lowest concentration of SARS-CoV-2 viral copies this assay can detect is 138 copies/mL. A negative result does not preclude SARS-Cov-2 infection and should not be used as the sole basis for treatment or other patient management decisions. A negative result may occur with  improper specimen collection/handling, submission of specimen other than nasopharyngeal swab, presence of viral mutation(s) within the areas targeted by this assay, and inadequate number of viral copies(<138 copies/mL). A negative result must be combined with clinical observations, patient history, and epidemiological information. The expected  result is Negative.  Fact Sheet for Patients:  bloggercourse.com  Fact Sheet for Healthcare Providers:  seriousbroker.it  This test is no t yet approved or cleared by the [place]  FDA and  has been authorized for detection and/or diagnosis of SARS-CoV-2 by FDA under an Emergency Use Authorization (EUA). This EUA will remain  in effect (meaning this test can be used) for the duration of the COVID-19 declaration under Section 564(b)(1) of the Act, 21 U.S.C.section 360bbb-3(b)(1), unless the authorization is terminated  or revoked sooner.       Influenza A by PCR NEGATIVE NEGATIVE Final   Influenza B by PCR NEGATIVE NEGATIVE Final    Comment: (NOTE) The Xpert Xpress SARS-CoV-2/FLU/RSV plus assay is intended as an aid in the diagnosis of influenza from Nasopharyngeal swab specimens and should not be used as a sole  basis for treatment. Nasal washings and aspirates are unacceptable for Xpert Xpress SARS-CoV-2/FLU/RSV testing.  Fact Sheet for Patients: bloggercourse.com  Fact Sheet for Healthcare Providers: seriousbroker.it  This test is not yet approved or cleared by the [place]  FDA and has been authorized for detection and/or diagnosis of SARS-CoV-2 by FDA under an Emergency Use Authorization (EUA). This EUA will remain in effect (meaning this test can be used) for the duration of the COVID-19 declaration under Section 564(b)(1) of the Act, 21 U.S.C. section 360bbb-3(b)(1), unless the authorization is terminated or revoked.     Resp Syncytial Virus by PCR NEGATIVE NEGATIVE Final    Comment: (NOTE) Fact Sheet for Patients: bloggercourse.com  Fact Sheet for Healthcare Providers: seriousbroker.it  This test is not yet approved or cleared by the [place]  FDA and has been authorized for detection and/or diagnosis of  SARS-CoV-2 by FDA under an Emergency Use Authorization (EUA). This EUA will remain in effect (meaning this test can be used) for the duration of the COVID-19 declaration under Section 564(b)(1) of the Act, 21 U.S.C. section 360bbb-3(b)(1), unless the authorization is terminated or revoked.  Performed at [facility], [address] [place], [place] [address]     Labs: CBC: Recent Labs  Lab 12/06/24 2056 12/07/24 0449  WBC 6.7 6.5  HGB 14.2 13.5  HCT 45.8 42.3  MCV 87.2 86.3  PLT 168 151   Basic Metabolic Panel: Recent Labs  Lab 12/07/24 0449 12/08/24 0817 12/09/24 1026 12/10/24 0438 12/11/24 0456  NA 132* 137 137 135 138  K 3.5 3.8 4.2 4.0 3.7  CL 95* 100 101 99 107  CO2 26 25 26 26 23   GLUCOSE 188* 129* 156* 217* 199*  BUN 90* 84* 73* 70* 65*  CREATININE 4.29* 3.80* 2.98* 2.72* 2.20*  CALCIUM  8.0* 8.2* 8.2* 8.0* 7.0*  MG 1.8  --  1.6* 2.1  --   PHOS 3.2  --  2.4* 2.8  --    Liver Function Tests: No results for input(s): AST, ALT, ALKPHOS, BILITOT, PROT, ALBUMIN  in the last 168 hours. CBG: Recent Labs  Lab 12/10/24 1956 12/11/24 0825 12/11/24 1153 12/11/24 1708 12/12/24 0745  GLUCAP 195* 176* 277* 119* 113*    Discharge time spent: greater than 30 minutes.  This record has been created using Conservation officer, historic buildings. Errors have been sought and corrected,but may not always be located. Such creation errors do not reflect on the standard of care.   Signed: Amaryllis Dare, MD [facility] 12/12/2024 "

## 2024-12-12 NOTE — Progress Notes (Signed)
 SPIRITUAL CARE AND COUNSELING CONSULT NOTE   VISIT SUMMARY drop in visit before discharge   SPIRITUAL ENCOUNTER                                                                                                                                                                      Type of Visit: Initial Care provided to:: Patient Reason for visit: Routine spiritual support OnCall Visit: No   SPIRITUAL FRAMEWORK  Presenting Themes: Goals in life/care Community/Connection: Family Strengths: Healing and being discharged Needs/Challenges/Barriers: Knee pain and sitting to avoid pain Patient Stress Factors: Health changes   GOALS   Self/Personal Goals: Discharge with daughter Clinical Care Goals: Knee pain and sitting to avoid pain   INTERVENTIONS   Spiritual Care Interventions Made: Compassionate presence, Reflective listening    INTERVENTION OUTCOMES   Outcomes: Awareness of support  SPIRITUAL CARE PLAN        If immediate needs arise, please contact [facility] 24 hour on call [phone]   Barnie JINNY Record, Chaplain  12/12/2024 3:54 PM

## 2024-12-12 NOTE — TOC Transition Note (Addendum)
 Transition of Care [facility]) - Discharge Note   Patient Details  Name: [name] MRN: [id number] Date of Birth: 01-08-1935  [facility]) CM/SW Contact:  Alvaro Louder, LCSW Phone Number: 12/12/2024, 1:34 PM   Clinical Narrative:   LCSWA confirmed with MD that patient is stable for discharge. LCSWA notified the patient and they are in agreement with discharge. LCSWA discussed PT recommendation of HH Patient was agreeable. LCSWA reached out to [facility] admissions coordinator and started service for patient. Daughter will transport patient home.   TOC signing off    Final next level of care: Home w Home Health Services Barriers to Discharge: No Barriers Identified   Patient Goals and CMS Choice            Discharge Placement              Patient chooses bed at:  (Home) Patient to be transferred to facility by: [facility] Name of family member notified: [name] Patient and family notified of of transfer: 12/12/24  Discharge Plan and Services Additional resources added to the After Visit Summary for                            [facility] Arranged: PT, OT, RN [facility] Agency: [facility] Date [facility] Agency Contacted: 12/12/24   Representative spoke with at [facility] Agency: [place]raine  Social Drivers of Health (SDOH) Interventions SDOH Screenings   Food Insecurity: No Food Insecurity (12/07/2024)  Housing: Unknown (12/07/2024)  Transportation Needs: No Transportation Needs (12/07/2024)  Utilities: Not At Risk (12/07/2024)  Depression (PHQ2-9): Low Risk (04/07/2022)  Financial Resource Strain: Low Risk (09/21/2024)   Received from [facility]  Social Connections: Moderately Integrated (12/07/2024)  Tobacco Use: Medium Risk (12/06/2024)     Readmission Risk Interventions    11/19/2024    1:11 PM 10/17/2024   10:32 AM 03/26/2024    4:09 PM  Readmission Risk Prevention Plan  Transportation Screening Complete Complete Complete  Medication Review [employer])  Complete Complete Complete  PCP or Specialist appointment within 3-5 days of discharge -- Complete Complete  HRI or Home Care Consult Complete Complete Complete  SW Recovery Care/Counseling Consult  Complete Complete  Palliative Care Screening Not Applicable Not Applicable Not Applicable  Skilled Nursing Facility Complete Not Applicable Not Applicable

## 2024-12-18 ENCOUNTER — Ambulatory Visit: Admitting: Family

## 2025-01-03 ENCOUNTER — Ambulatory Visit: Admitting: Podiatry

## 2025-01-08 ENCOUNTER — Encounter

## 2025-01-22 ENCOUNTER — Ambulatory Visit

## 2025-04-02 ENCOUNTER — Ambulatory Visit: Admitting: Cardiology

## 2025-04-09 ENCOUNTER — Encounter

## 2025-04-23 ENCOUNTER — Ambulatory Visit

## 2025-07-09 ENCOUNTER — Encounter

## 2025-07-23 ENCOUNTER — Ambulatory Visit

## 2025-10-08 ENCOUNTER — Encounter
